# Patient Record
Sex: Male | Born: 1955 | Race: White | Hispanic: No | Marital: Married | Smoking: Former smoker
Health system: Southern US, Community
[De-identification: ages and names within clinical notes are randomized; demographics above are authoritative.]

## PROBLEM LIST (undated history)

## (undated) DIAGNOSIS — N189 Chronic kidney disease, unspecified: Secondary | ICD-10-CM

## (undated) DIAGNOSIS — I509 Heart failure, unspecified: Secondary | ICD-10-CM

## (undated) DIAGNOSIS — R809 Proteinuria, unspecified: Secondary | ICD-10-CM

## (undated) DIAGNOSIS — R972 Elevated prostate specific antigen [PSA]: Secondary | ICD-10-CM

## (undated) DIAGNOSIS — I219 Acute myocardial infarction, unspecified: Secondary | ICD-10-CM

## (undated) DIAGNOSIS — R0902 Hypoxemia: Secondary | ICD-10-CM

## (undated) DIAGNOSIS — Z9989 Dependence on other enabling machines and devices: Secondary | ICD-10-CM

## (undated) DIAGNOSIS — F32A Depression, unspecified: Secondary | ICD-10-CM

## (undated) DIAGNOSIS — F329 Major depressive disorder, single episode, unspecified: Secondary | ICD-10-CM

## (undated) DIAGNOSIS — I499 Cardiac arrhythmia, unspecified: Secondary | ICD-10-CM

## (undated) DIAGNOSIS — I5042 Chronic combined systolic (congestive) and diastolic (congestive) heart failure: Secondary | ICD-10-CM

## (undated) DIAGNOSIS — K635 Polyp of colon: Secondary | ICD-10-CM

## (undated) DIAGNOSIS — E119 Type 2 diabetes mellitus without complications: Secondary | ICD-10-CM

## (undated) DIAGNOSIS — G4733 Obstructive sleep apnea (adult) (pediatric): Secondary | ICD-10-CM

## (undated) DIAGNOSIS — G709 Myoneural disorder, unspecified: Secondary | ICD-10-CM

## (undated) DIAGNOSIS — M1990 Unspecified osteoarthritis, unspecified site: Secondary | ICD-10-CM

## (undated) DIAGNOSIS — G4730 Sleep apnea, unspecified: Secondary | ICD-10-CM

## (undated) DIAGNOSIS — E7800 Pure hypercholesterolemia, unspecified: Secondary | ICD-10-CM

## (undated) DIAGNOSIS — Z8719 Personal history of other diseases of the digestive system: Secondary | ICD-10-CM

## (undated) DIAGNOSIS — E785 Hyperlipidemia, unspecified: Secondary | ICD-10-CM

## (undated) DIAGNOSIS — I739 Peripheral vascular disease, unspecified: Secondary | ICD-10-CM

## (undated) DIAGNOSIS — Z95810 Presence of automatic (implantable) cardiac defibrillator: Secondary | ICD-10-CM

## (undated) DIAGNOSIS — I2510 Atherosclerotic heart disease of native coronary artery without angina pectoris: Secondary | ICD-10-CM

## (undated) DIAGNOSIS — J189 Pneumonia, unspecified organism: Secondary | ICD-10-CM

## (undated) DIAGNOSIS — I10 Essential (primary) hypertension: Secondary | ICD-10-CM

## (undated) HISTORY — DX: Hyperlipidemia, unspecified: E78.5

## (undated) HISTORY — DX: Major depressive disorder, single episode, unspecified: F32.9

## (undated) HISTORY — PX: CORONARY ANGIOPLASTY WITH STENT PLACEMENT: SHX49

## (undated) HISTORY — PX: COLONOSCOPY: SHX174

## (undated) HISTORY — DX: Hypoxemia: R09.02

## (undated) HISTORY — DX: Polyp of colon: K63.5

## (undated) HISTORY — PX: ELBOW SURGERY: SHX618

## (undated) HISTORY — PX: CORONARY ARTERY BYPASS GRAFT: SHX141

## (undated) HISTORY — PX: EYE SURGERY: SHX253

## (undated) HISTORY — DX: Depression, unspecified: F32.A

## (undated) HISTORY — PX: PROSTATE BIOPSY: SHX241

## (undated) SURGERY — Surgical Case
Anesthesia: *Unknown

---
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
## 1998-08-26 ENCOUNTER — Encounter: Payer: Self-pay | Admitting: Emergency Medicine

## 1998-08-26 ENCOUNTER — Emergency Department (HOSPITAL_COMMUNITY): Admission: EM | Admit: 1998-08-26 | Discharge: 1998-08-26 | Payer: Self-pay | Admitting: Emergency Medicine

## 2001-01-25 DIAGNOSIS — I219 Acute myocardial infarction, unspecified: Secondary | ICD-10-CM

## 2001-01-25 HISTORY — PX: CARDIAC CATHETERIZATION: SHX172

## 2001-01-25 HISTORY — DX: Acute myocardial infarction, unspecified: I21.9

## 2014-01-25 DIAGNOSIS — Z95810 Presence of automatic (implantable) cardiac defibrillator: Secondary | ICD-10-CM

## 2014-01-25 HISTORY — PX: CARDIAC DEFIBRILLATOR PLACEMENT: SHX171

## 2014-01-25 HISTORY — DX: Presence of automatic (implantable) cardiac defibrillator: Z95.810

## 2014-03-26 HISTORY — PX: CATARACT EXTRACTION W/ INTRAOCULAR LENS IMPLANT: SHX1309

## 2014-07-15 ENCOUNTER — Inpatient Hospital Stay (HOSPITAL_COMMUNITY)
Admission: EM | Admit: 2014-07-15 | Discharge: 2014-07-17 | DRG: 250 | Disposition: A | Discharge status: Home / Self Care | Payer: Medicare Other | Attending: Cardiology | Admitting: Cardiology

## 2014-07-15 DIAGNOSIS — I214 Non-ST elevation (NSTEMI) myocardial infarction: Secondary | ICD-10-CM | POA: Diagnosis not present

## 2014-07-15 DIAGNOSIS — I10 Essential (primary) hypertension: Secondary | ICD-10-CM | POA: Diagnosis present

## 2014-07-15 DIAGNOSIS — T82858A Stenosis of vascular prosthetic devices, implants and grafts, initial encounter: Principal | ICD-10-CM | POA: Diagnosis present

## 2014-07-15 DIAGNOSIS — I25118 Atherosclerotic heart disease of native coronary artery with other forms of angina pectoris: Secondary | ICD-10-CM | POA: Diagnosis present

## 2014-07-15 DIAGNOSIS — I252 Old myocardial infarction: Secondary | ICD-10-CM | POA: Diagnosis not present

## 2014-07-15 DIAGNOSIS — E1165 Type 2 diabetes mellitus with hyperglycemia: Secondary | ICD-10-CM | POA: Diagnosis present

## 2014-07-15 DIAGNOSIS — Z7902 Long term (current) use of antithrombotics/antiplatelets: Secondary | ICD-10-CM

## 2014-07-15 DIAGNOSIS — Z7982 Long term (current) use of aspirin: Secondary | ICD-10-CM

## 2014-07-15 DIAGNOSIS — E669 Obesity, unspecified: Secondary | ICD-10-CM | POA: Diagnosis not present

## 2014-07-15 DIAGNOSIS — R079 Chest pain, unspecified: Secondary | ICD-10-CM

## 2014-07-15 DIAGNOSIS — Z6837 Body mass index (BMI) 37.0-37.9, adult: Secondary | ICD-10-CM | POA: Diagnosis not present

## 2014-07-15 DIAGNOSIS — Z794 Long term (current) use of insulin: Secondary | ICD-10-CM | POA: Diagnosis not present

## 2014-07-15 DIAGNOSIS — E780 Pure hypercholesterolemia: Secondary | ICD-10-CM | POA: Diagnosis present

## 2014-07-15 HISTORY — DX: Proteinuria, unspecified: R80.9

## 2014-07-15 HISTORY — DX: Pure hypercholesterolemia, unspecified: E78.00

## 2014-07-15 HISTORY — DX: Unspecified osteoarthritis, unspecified site: M19.90

## 2014-07-15 HISTORY — DX: Essential (primary) hypertension: I10

## 2014-07-15 HISTORY — DX: Acute myocardial infarction, unspecified: I21.9

## 2014-07-15 HISTORY — DX: Atherosclerotic heart disease of native coronary artery without angina pectoris: I25.10

## 2014-07-15 HISTORY — DX: Type 2 diabetes mellitus without complications: E11.9

## 2014-07-15 HISTORY — DX: Sleep apnea, unspecified: G47.30

## 2014-07-16 ENCOUNTER — Encounter (HOSPITAL_COMMUNITY): Payer: Self-pay

## 2014-07-16 ENCOUNTER — Emergency Department (HOSPITAL_COMMUNITY): Payer: Medicare Other

## 2014-07-16 ENCOUNTER — Encounter (HOSPITAL_COMMUNITY)
Admission: EM | Disposition: A | Discharge status: Home / Self Care | Payer: Self-pay | Source: Home / Self Care | Attending: Cardiology

## 2014-07-16 DIAGNOSIS — E669 Obesity, unspecified: Secondary | ICD-10-CM | POA: Diagnosis present

## 2014-07-16 DIAGNOSIS — R079 Chest pain, unspecified: Secondary | ICD-10-CM | POA: Diagnosis present

## 2014-07-16 DIAGNOSIS — Z7902 Long term (current) use of antithrombotics/antiplatelets: Secondary | ICD-10-CM | POA: Diagnosis not present

## 2014-07-16 DIAGNOSIS — I252 Old myocardial infarction: Secondary | ICD-10-CM | POA: Diagnosis not present

## 2014-07-16 DIAGNOSIS — Z794 Long term (current) use of insulin: Secondary | ICD-10-CM | POA: Diagnosis not present

## 2014-07-16 DIAGNOSIS — I214 Non-ST elevation (NSTEMI) myocardial infarction: Secondary | ICD-10-CM

## 2014-07-16 DIAGNOSIS — I10 Essential (primary) hypertension: Secondary | ICD-10-CM | POA: Diagnosis present

## 2014-07-16 DIAGNOSIS — I25118 Atherosclerotic heart disease of native coronary artery with other forms of angina pectoris: Secondary | ICD-10-CM | POA: Diagnosis not present

## 2014-07-16 DIAGNOSIS — Z6837 Body mass index (BMI) 37.0-37.9, adult: Secondary | ICD-10-CM | POA: Diagnosis not present

## 2014-07-16 DIAGNOSIS — Z7982 Long term (current) use of aspirin: Secondary | ICD-10-CM | POA: Diagnosis not present

## 2014-07-16 DIAGNOSIS — R0789 Other chest pain: Secondary | ICD-10-CM | POA: Diagnosis not present

## 2014-07-16 DIAGNOSIS — I25708 Atherosclerosis of coronary artery bypass graft(s), unspecified, with other forms of angina pectoris: Secondary | ICD-10-CM | POA: Diagnosis not present

## 2014-07-16 DIAGNOSIS — E780 Pure hypercholesterolemia: Secondary | ICD-10-CM | POA: Diagnosis present

## 2014-07-16 DIAGNOSIS — T82858A Stenosis of vascular prosthetic devices, implants and grafts, initial encounter: Secondary | ICD-10-CM | POA: Diagnosis not present

## 2014-07-16 DIAGNOSIS — E1165 Type 2 diabetes mellitus with hyperglycemia: Secondary | ICD-10-CM | POA: Diagnosis not present

## 2014-07-16 HISTORY — PX: CARDIAC CATHETERIZATION: SHX172

## 2014-07-16 HISTORY — DX: Non-ST elevation (NSTEMI) myocardial infarction: I21.4

## 2014-07-16 LAB — GLUCOSE, CAPILLARY
Glucose-Capillary: 452 mg/dL — ABNORMAL HIGH (ref 65–99)
Glucose-Capillary: 247 mg/dL — ABNORMAL HIGH (ref 65–99)
Glucose-Capillary: 203 mg/dL — ABNORMAL HIGH (ref 65–99)
Glucose-Capillary: 139 mg/dL — ABNORMAL HIGH (ref 65–99)
Glucose-Capillary: 277 mg/dL — ABNORMAL HIGH (ref 65–99)

## 2014-07-16 LAB — CBG MONITORING, ED: Glucose-Capillary: 461 mg/dL — ABNORMAL HIGH (ref 65–99)

## 2014-07-16 LAB — TROPONIN I
Troponin I: 0.7 ng/mL (ref <0.031)
Troponin I: 3.81 ng/mL (ref <0.031)
Troponin I: 6.39 ng/mL (ref <0.031)

## 2014-07-16 LAB — COMPREHENSIVE METABOLIC PANEL
ALT: 17 U/L (ref 17–63)
AST: 19 U/L (ref 15–41)
Albumin: 3.7 g/dL (ref 3.5–5.0)
Alkaline Phosphatase: 92 U/L (ref 38–126)
Anion gap: 9 (ref 5–15)
BUN: 21 mg/dL — ABNORMAL HIGH (ref 6–20)
CO2: 25 mmol/L (ref 22–32)
Calcium: 9.1 mg/dL (ref 8.9–10.3)
Chloride: 97 mmol/L — ABNORMAL LOW (ref 101–111)
Creatinine, Ser: 1.35 mg/dL — ABNORMAL HIGH (ref 0.61–1.24)
GFR calc Af Amer: >60 mL/min (ref >60)
GFR calc non Af Amer: 56 mL/min — ABNORMAL LOW (ref >60)
Glucose, Bld: 572 mg/dL (ref 65–99)
Potassium: 4.6 mmol/L (ref 3.5–5.1)
Sodium: 131 mmol/L — ABNORMAL LOW (ref 135–145)
Total Bilirubin: 0.9 mg/dL (ref 0.3–1.2)
Total Protein: 6.5 g/dL (ref 6.5–8.1)

## 2014-07-16 LAB — I-STAT TROPONIN, ED: Troponin i, poc: 0.06 ng/mL (ref 0.00–0.08)

## 2014-07-16 LAB — CBC WITH DIFFERENTIAL/PLATELET
Basophils Absolute: 0.1 10*3/uL (ref 0.0–0.1)
Basophils Relative: 1 % (ref 0–1)
Eosinophils Absolute: 0.3 10*3/uL (ref 0.0–0.7)
Eosinophils Relative: 3 % (ref 0–5)
HCT: 40.2 % (ref 39.0–52.0)
Hemoglobin: 13.8 g/dL (ref 13.0–17.0)
Lymphocytes Relative: 15 % (ref 12–46)
Lymphs Abs: 1.3 10*3/uL (ref 0.7–4.0)
MCH: 30.3 pg (ref 26.0–34.0)
MCHC: 34.3 g/dL (ref 30.0–36.0)
MCV: 88.4 fL (ref 78.0–100.0)
Monocytes Absolute: 0.7 10*3/uL (ref 0.1–1.0)
Monocytes Relative: 8 % (ref 3–12)
Neutro Abs: 6.3 10*3/uL (ref 1.7–7.7)
Neutrophils Relative %: 73 % (ref 43–77)
Platelets: 214 10*3/uL (ref 150–400)
RBC: 4.55 MIL/uL (ref 4.22–5.81)
RDW: 12.5 % (ref 11.5–15.5)
WBC: 8.7 10*3/uL (ref 4.0–10.5)

## 2014-07-16 LAB — BRAIN NATRIURETIC PEPTIDE: B Natriuretic Peptide: 65.7 pg/mL (ref 0.0–100.0)

## 2014-07-16 LAB — LIPID PANEL
Cholesterol: 145 mg/dL (ref 0–200)
HDL: 26 mg/dL — ABNORMAL LOW (ref >40)
LDL Cholesterol: 74 mg/dL (ref 0–99)
Total CHOL/HDL Ratio: 5.6 RATIO
Triglycerides: 223 mg/dL — ABNORMAL HIGH (ref <150)
VLDL: 45 mg/dL — ABNORMAL HIGH (ref 0–40)

## 2014-07-16 LAB — PROTIME-INR
INR: 1.06 (ref 0.00–1.49)
Prothrombin Time: 14 seconds (ref 11.6–15.2)

## 2014-07-16 LAB — TSH: TSH: 2.068 u[IU]/mL (ref 0.350–4.500)

## 2014-07-16 LAB — POCT ACTIVATED CLOTTING TIME: Activated Clotting Time: 435 seconds

## 2014-07-16 SURGERY — LEFT HEART CATH AND CORONARY ANGIOGRAPHY

## 2014-07-16 MED ORDER — HYDRALAZINE HCL 20 MG/ML IJ SOLN
INTRAMUSCULAR | Status: AC
  Filled 2014-07-16: qty 1

## 2014-07-16 MED ORDER — SODIUM CHLORIDE 0.9 % IJ SOLN: 3.0000 mL | INTRAMUSCULAR | Status: DC | PRN

## 2014-07-16 MED ORDER — OMEGA-3-ACID ETHYL ESTERS 1 G PO CAPS
2.0000 g | ORAL_CAPSULE | Freq: Two times a day (BID) | ORAL | Status: DC
  Administered 2014-07-16 – 2014-07-17 (×2): 2 g via ORAL
  Filled 2014-07-16 (×2): qty 2

## 2014-07-16 MED ORDER — ATORVASTATIN CALCIUM 80 MG PO TABS
80.0000 mg | ORAL_TABLET | Freq: Every day | ORAL | Status: DC
  Administered 2014-07-17: 11:00:00 80 mg via ORAL
  Filled 2014-07-16 (×2): qty 1

## 2014-07-16 MED ORDER — NITROGLYCERIN 0.4 MG SL SUBL
0.4000 mg | SUBLINGUAL_TABLET | SUBLINGUAL | Status: DC | PRN
Start: 2014-07-16 — End: 2014-07-17

## 2014-07-16 MED ORDER — SODIUM CHLORIDE 0.9 % IV SOLN
INTRAVENOUS | Status: AC
  Administered 2014-07-16: 03:00:00 via INTRAVENOUS

## 2014-07-16 MED ORDER — ASPIRIN 81 MG PO CHEW: 81.0000 mg | CHEWABLE_TABLET | ORAL | Status: DC

## 2014-07-16 MED ORDER — NITROGLYCERIN 1 MG/10 ML FOR IR/CATH LAB
INTRA_ARTERIAL | Status: AC
  Filled 2014-07-16: qty 10

## 2014-07-16 MED ORDER — ASPIRIN 81 MG PO CHEW
CHEWABLE_TABLET | ORAL | Status: DC | PRN
  Administered 2014-07-16: 324 mg via ORAL

## 2014-07-16 MED ORDER — BIVALIRUDIN BOLUS VIA INFUSION - CUPID
INTRAVENOUS | Status: DC | PRN
  Administered 2014-07-16: 77.175 mg via INTRAVENOUS

## 2014-07-16 MED ORDER — SODIUM CHLORIDE 0.9 % IV SOLN: INTRAVENOUS | Status: AC

## 2014-07-16 MED ORDER — INSULIN REGULAR HUMAN (CONC) 500 UNIT/ML ~~LOC~~ SOLN: 80.0000 [IU] | Freq: Every day | SUBCUTANEOUS | Status: DC

## 2014-07-16 MED ORDER — HYDRALAZINE HCL 20 MG/ML IJ SOLN: 10.0000 mg | INTRAMUSCULAR | Status: DC | PRN

## 2014-07-16 MED ORDER — SODIUM CHLORIDE 0.9 % IV BOLUS (SEPSIS)
1000.0000 mL | Freq: Once | INTRAVENOUS | Status: AC
  Administered 2014-07-16: 1000 mL via INTRAVENOUS

## 2014-07-16 MED ORDER — INSULIN REGULAR HUMAN (CONC) 500 UNIT/ML ~~LOC~~ SOLN: 16.0000 [IU] | Freq: Every day | SUBCUTANEOUS | Status: DC

## 2014-07-16 MED ORDER — ASPIRIN 81 MG PO CHEW
CHEWABLE_TABLET | ORAL | Status: AC
  Filled 2014-07-16: qty 1

## 2014-07-16 MED ORDER — INSULIN REGULAR HUMAN (CONC) 500 UNIT/ML ~~LOC~~ SOLN: 100.0000 [IU] | Freq: Every day | SUBCUTANEOUS | Status: DC

## 2014-07-16 MED ORDER — SODIUM CHLORIDE 0.9 % IV SOLN: 250.0000 mL | INTRAVENOUS | Status: DC | PRN

## 2014-07-16 MED ORDER — HYDROMORPHONE HCL 1 MG/ML IJ SOLN
INTRAMUSCULAR | Status: AC
  Filled 2014-07-16: qty 1

## 2014-07-16 MED ORDER — BIVALIRUDIN 250 MG IV SOLR
INTRAVENOUS | Status: AC
  Filled 2014-07-16: qty 250

## 2014-07-16 MED ORDER — HEPARIN (PORCINE) IN NACL 2-0.9 UNIT/ML-% IJ SOLN
INTRAMUSCULAR | Status: AC
  Filled 2014-07-16: qty 1000

## 2014-07-16 MED ORDER — SODIUM CHLORIDE 0.9 % IJ SOLN
3.0000 mL | Freq: Two times a day (BID) | INTRAMUSCULAR | Status: DC
  Administered 2014-07-16: 3 mL via INTRAVENOUS

## 2014-07-16 MED ORDER — SODIUM CHLORIDE 0.9 % IJ SOLN
3.0000 mL | Freq: Two times a day (BID) | INTRAMUSCULAR | Status: DC
  Administered 2014-07-17: 11:00:00 3 mL via INTRAVENOUS

## 2014-07-16 MED ORDER — SODIUM CHLORIDE 0.9 % IV SOLN
250.0000 mg | INTRAVENOUS | Status: DC | PRN
  Administered 2014-07-16: 1.75 mg/kg/h via INTRAVENOUS

## 2014-07-16 MED ORDER — MORPHINE SULFATE 4 MG/ML IJ SOLN
4.0000 mg | Freq: Once | INTRAMUSCULAR | Status: AC
  Administered 2014-07-16: 4 mg via INTRAVENOUS
  Filled 2014-07-16: qty 1

## 2014-07-16 MED ORDER — INSULIN REGULAR HUMAN (CONC) 500 UNIT/ML ~~LOC~~ SOLN
50.0000 [IU] | Freq: Every day | SUBCUTANEOUS | Status: DC
  Filled 2014-07-16: qty 20

## 2014-07-16 MED ORDER — LIDOCAINE HCL (PF) 1 % IJ SOLN
INTRAMUSCULAR | Status: DC | PRN
  Administered 2014-07-16: 15 mL

## 2014-07-16 MED ORDER — LIDOCAINE HCL (PF) 1 % IJ SOLN
INTRAMUSCULAR | Status: AC
  Filled 2014-07-16: qty 30

## 2014-07-16 MED ORDER — INSULIN REGULAR HUMAN (CONC) 500 UNIT/ML ~~LOC~~ SOLN: 100.0000 [IU] | Freq: Two times a day (BID) | SUBCUTANEOUS | Status: DC

## 2014-07-16 MED ORDER — HEPARIN BOLUS VIA INFUSION
4000.0000 [IU] | Freq: Once | INTRAVENOUS | Status: AC
  Administered 2014-07-16: 4000 [IU] via INTRAVENOUS
  Filled 2014-07-16: qty 4000

## 2014-07-16 MED ORDER — ASPIRIN EC 81 MG PO TBEC
81.0000 mg | DELAYED_RELEASE_TABLET | Freq: Every day | ORAL | Status: DC
  Administered 2014-07-17: 11:00:00 81 mg via ORAL
  Filled 2014-07-16: qty 1

## 2014-07-16 MED ORDER — LISINOPRIL 40 MG PO TABS
40.0000 mg | ORAL_TABLET | Freq: Every day | ORAL | Status: DC
  Administered 2014-07-17: 40 mg via ORAL
  Filled 2014-07-16 (×2): qty 1

## 2014-07-16 MED ORDER — INSULIN REGULAR HUMAN (CONC) 500 UNIT/ML ~~LOC~~ SOLN
60.0000 [IU] | Freq: Every day | SUBCUTANEOUS | Status: DC
  Administered 2014-07-16: 21:00:00 60 [IU] via SUBCUTANEOUS
  Filled 2014-07-16: qty 20

## 2014-07-16 MED ORDER — INSULIN ASPART 100 UNIT/ML ~~LOC~~ SOLN: 20.0000 [IU] | Freq: Three times a day (TID) | SUBCUTANEOUS | Status: DC

## 2014-07-16 MED ORDER — PRASUGREL HCL 10 MG PO TABS
10.0000 mg | ORAL_TABLET | Freq: Every day | ORAL | Status: DC
  Administered 2014-07-16 – 2014-07-17 (×2): 10 mg via ORAL
  Filled 2014-07-16 (×2): qty 1

## 2014-07-16 MED ORDER — INSULIN REGULAR HUMAN (CONC) 500 UNIT/ML ~~LOC~~ SOLN: 20.0000 [IU] | Freq: Every day | SUBCUTANEOUS | Status: DC

## 2014-07-16 MED ORDER — MIDAZOLAM HCL 2 MG/2ML IJ SOLN
INTRAMUSCULAR | Status: DC | PRN
  Administered 2014-07-16: 1 mg via INTRAVENOUS

## 2014-07-16 MED ORDER — ACETAMINOPHEN 325 MG PO TABS: 650.0000 mg | ORAL_TABLET | ORAL | Status: DC | PRN

## 2014-07-16 MED ORDER — SODIUM CHLORIDE 0.9 % IV SOLN: INTRAVENOUS | Status: DC

## 2014-07-16 MED ORDER — HYDROMORPHONE HCL 1 MG/ML IJ SOLN
INTRAMUSCULAR | Status: DC | PRN
Start: 2014-07-16 — End: 2014-07-16
  Administered 2014-07-16: 0.5 mg via INTRAVENOUS

## 2014-07-16 MED ORDER — ZOLPIDEM TARTRATE 5 MG PO TABS: 5.0000 mg | ORAL_TABLET | Freq: Every evening | ORAL | Status: DC | PRN

## 2014-07-16 MED ORDER — ANGIOPLASTY BOOK
Freq: Once | Status: AC
  Administered 2014-07-16: 21:00:00
  Filled 2014-07-16: qty 1

## 2014-07-16 MED ORDER — MIDAZOLAM HCL 2 MG/2ML IJ SOLN
INTRAMUSCULAR | Status: AC
  Filled 2014-07-16: qty 2

## 2014-07-16 MED ORDER — IOHEXOL 350 MG/ML SOLN
INTRAVENOUS | Status: DC | PRN
  Administered 2014-07-16: 195 mL via INTRA_ARTERIAL

## 2014-07-16 MED ORDER — HEPARIN (PORCINE) IN NACL 100-0.45 UNIT/ML-% IJ SOLN
1250.0000 [IU]/h | INTRAMUSCULAR | Status: DC
  Administered 2014-07-16: 1250 [IU]/h via INTRAVENOUS
  Filled 2014-07-16 (×3): qty 250

## 2014-07-16 MED ORDER — DIAZEPAM 5 MG PO TABS: 5.0000 mg | ORAL_TABLET | Freq: Four times a day (QID) | ORAL | Status: DC | PRN

## 2014-07-16 MED ORDER — BIVALIRUDIN 250 MG IV SOLR
1.7500 mg/kg/h | INTRAVENOUS | Status: DC
  Administered 2014-07-16: 1.75 mg/kg/h via INTRAVENOUS
  Filled 2014-07-16: qty 250

## 2014-07-16 MED ORDER — INSULIN GLARGINE 100 UNIT/ML ~~LOC~~ SOLN
50.0000 [IU] | Freq: Once | SUBCUTANEOUS | Status: AC
  Administered 2014-07-16: 50 [IU] via SUBCUTANEOUS
  Filled 2014-07-16: qty 0.5

## 2014-07-16 MED ORDER — INSULIN ASPART 100 UNIT/ML ~~LOC~~ SOLN
20.0000 [IU] | Freq: Once | SUBCUTANEOUS | Status: AC
  Administered 2014-07-16: 20 [IU] via SUBCUTANEOUS
  Filled 2014-07-16: qty 1

## 2014-07-16 MED ORDER — METOPROLOL TARTRATE 25 MG PO TABS
50.0000 mg | ORAL_TABLET | Freq: Two times a day (BID) | ORAL | Status: DC
  Administered 2014-07-16 – 2014-07-17 (×3): 50 mg via ORAL
  Filled 2014-07-16: qty 2
  Filled 2014-07-16: qty 1
  Filled 2014-07-16 (×2): qty 2
  Filled 2014-07-16: qty 1

## 2014-07-16 MED ORDER — ONDANSETRON HCL 4 MG/2ML IJ SOLN: 4.0000 mg | Freq: Four times a day (QID) | INTRAMUSCULAR | Status: DC | PRN

## 2014-07-16 SURGICAL SUPPLY — 26 items
BALLN ANGIOSCULPT RX 3.0X6 (BALLOONS) ×3
BALLN EMERGE MR 2.5X15 (BALLOONS) ×6
BALLN EMERGE MR PUSH 1.20X12 (BALLOONS) ×3
BALLOON ANGIOSCULPT RX 3.0X6 (BALLOONS) ×2 IMPLANT
BALLOON EMERGE MR 2.5X15 (BALLOONS) ×4 IMPLANT
BALLOON EMERGE MR PUSH 1.20X12 (BALLOONS) ×2 IMPLANT
CATH INFINITI 5 FR IM (CATHETERS) ×3 IMPLANT
CATH INFINITI 5FR JL4 (CATHETERS) ×3 IMPLANT
CATH INFINITI 5FR MPB2 (CATHETERS) ×3 IMPLANT
CATH LAUNCHER 6FR AL2 (CATHETERS) ×2 IMPLANT
CATHETER LAUNCHER 6FR AL2 (CATHETERS) ×3
GUIDELINER 6F (CATHETERS) ×3 IMPLANT
HOVERMATT SINGLE USE (MISCELLANEOUS) ×3 IMPLANT
KIT ENCORE 26 ADVANTAGE (KITS) ×6 IMPLANT
KIT HEART LEFT (KITS) ×3 IMPLANT
PACK CARDIAC CATHETERIZATION (CUSTOM PROCEDURE TRAY) ×3 IMPLANT
SHEATH PINNACLE 5F 10CM (SHEATH) ×3 IMPLANT
SHEATH PINNACLE 6F 10CM (SHEATH) ×3 IMPLANT
TRANSDUCER W/STOPCOCK (MISCELLANEOUS) ×3 IMPLANT
TUBING CIL FLEX 10 FLL-RA (TUBING) ×3 IMPLANT
WIRE COUGAR XT STRL 190CM (WIRE) ×3 IMPLANT
WIRE EMERALD 3MM-J .035X150CM (WIRE) ×3 IMPLANT
WIRE HI TORQ BMW 190CM (WIRE) ×3 IMPLANT
WIRE HI TORQ VERSACORE-J 145CM (WIRE) ×3 IMPLANT
WIRE HI TORQ WIGGLE 190CM (WIRE) ×3 IMPLANT
WIRE SAFE-T 1.5MM-J .035X260CM (WIRE) ×3 IMPLANT

## 2014-07-16 NOTE — Progress Notes (Signed)
Admitted pt from ED per stretcher. Alert and Oriented x4, ambulatory, denies chest pain, denies nausea and vomiting, not in respiratory distress. Placed on telemetry box 27 with CMT notified. Oriented to room and call bell. CBG taken at 452 mg/dL, pt stated that he is taking U500 insulin at home BID but was not able to take it yesterday. Dr. Einar Gip was notified and order for 50 U Lantus subcutaneous now. Will continue to monitor pt   07/16/14 0228  Vitals  Temp 98.2 F (36.8 C)  Temp Source Oral  BP (!) 155/93 mmHg  BP Location Left Arm  BP Method Automatic  Patient Position (if appropriate) Sitting  Pulse Rate 91  Pulse Rate Source Dinamap  Resp 18  Oxygen Therapy  SpO2 95 %  O2 Device Room Air  Pain Assessment  Pain Assessment No/denies pain  Height and Weight  Height 5\' 7"  (1.702 m)  Weight 102.921 kg (226 lb 14.4 oz) (b scale)  Type of Scale Used Standing  Type of Weight Actual  BSA (Calculated - sq m) 2.21 sq meters  BMI (Calculated) 35.6  Weight in (lb) to have BMI = 25 159.3

## 2014-07-16 NOTE — ED Provider Notes (Signed)
CSN: [id number]     Arrival date & time 07/15/14  2350 History   First MD Initiated Contact with Patient 07/16/14 0002     Chief Complaint  Patient presents with  . Chest Pain    (Consider location/radiation/quality/duration/timing/severity/associated sxs/prior Treatment) HPI Comments: 59 year old male presents to the emergency department for further evaluation of chest pain. Patient states that chest pain began acutely at 2230 after eating watermelon and cantaloupe. Patient describes the pain as central and stabbing. Pain was nonradiating, but associated with diaphoresis and shortness of breath. Patient reports improvement in symptoms with nitroglycerin 1 given by EMS. Patient also took 325 mg aspirin prior to arrival. He rates his chest pain at 3-4/10 at present. He states that he is followed by cardiology at the [facility] in [place], [place]. He reports that his pain feels similar to the chest pain he had one year ago. He states that, at this time, his widow maker was found to be 95% occluded. He had PCI 2 during a cath during this admission, in June 2015. 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 He has had a total of 8 stents placed. He states that his chest pain and stents are a result of his arteries "collapsing" rather than atherosclerotic heart disease. Patient denies any fever, syncope, nausea, vomiting, or leg swelling. He has no PCP in the area.  The history is provided by the patient. No language interpreter was used.    Past Medical History  Diagnosis Date  . Coronary artery disease   . Diabetes mellitus without complication   . MI (myocardial infarction)   . High cholesterol   . Hypertension   . Proteinuria    Past Surgical History  Procedure Laterality Date  . Cardiac surgery    . Joint replacement     No family history on file. History  Substance Use Topics  . Smoking status: Never Smoker   . Smokeless tobacco: Former Systems developer  . Alcohol Use: No     Review of Systems  Constitutional: Positive for diaphoresis. Negative for fever.  Respiratory: Positive for shortness of breath.   Cardiovascular: Positive for chest pain. Negative for leg swelling.  Neurological: Negative for syncope.  All other systems reviewed and are negative.   Allergies  Review of patient's allergies indicates no known allergies.  Home Medications   Prior to Admission medications   Medication Sig Start Date End Date Taking? Authorizing Provider  aspirin EC 81 MG tablet Take 81 mg by mouth daily.   Yes Historical Provider, MD  atorvastatin (LIPITOR) 80 MG tablet Take 80 mg by mouth daily.   Yes Historical Provider, MD  Cholecalciferol (VITAMIN D PO) Take 1 tablet by mouth daily.   Yes Historical Provider, MD  insulin regular human CONCENTRATED (HUMULIN R) 500 UNIT/ML injection Inject 100 Units into the skin 2 (two) times daily with a meal.   Yes Historical Provider, MD  lisinopril (PRINIVIL,ZESTRIL) 40 MG tablet Take 40 mg by mouth daily.   Yes Historical Provider, MD  metFORMIN (GLUCOPHAGE) 1000 MG tablet Take 1,000 mg by mouth 2 (two) times daily with a meal.   Yes Historical Provider, MD  metoprolol (LOPRESSOR) 50 MG tablet Take 50 mg by mouth 2 (two) times daily.   Yes Historical Provider, MD  Omega-3 Fatty Acids (FISH OIL PO) Take 1 tablet by mouth daily.   Yes Historical Provider, MD  prasugrel (EFFIENT) 10 MG TABS tablet Take 10 mg by mouth daily.   Yes  Historical Provider, MD   BP 146/82 mmHg  Pulse 95  Temp(Src) 98.3 F (36.8 C) (Oral)  Resp 16  Ht 5\' 7"  (1.702 m)  Wt 235 lb (106.595 kg)  BMI 36.80 kg/m2  SpO2 96%   Physical Exam  Constitutional: He is oriented to person, place, and time. He appears well-developed and well-nourished. No distress.  Nontoxic/nonseptic appearing  HENT:  Head: Normocephalic and atraumatic.  Eyes: Conjunctivae and EOM are normal. No scleral icterus.  Neck: Normal range of motion.  Cardiovascular: Normal rate,  regular rhythm and intact distal pulses.   Distal radial pulse 2+ b/l  Pulmonary/Chest: Effort normal and breath sounds normal. No respiratory distress. He has no wheezes. He has no rales.  Respirations even and unlabored  Abdominal: Soft. He exhibits no distension. There is no tenderness. There is no rebound and no guarding.  Soft, nontender and obese abdomen  Musculoskeletal: Normal range of motion.  Neurological: He is alert and oriented to person, place, and time. He exhibits normal muscle tone. Coordination normal.  GCS 15. Speech is goal oriented. Patient moves extremities without ataxia.  Skin: Skin is warm and dry. No rash noted. He is not diaphoretic. No erythema. No pallor.  Psychiatric: He has a normal mood and affect. His behavior is normal.  Nursing note and vitals reviewed.   ED Course  Procedures (including critical care time) Labs Review Labs Reviewed  CBC WITH DIFFERENTIAL/PLATELET  COMPREHENSIVE METABOLIC PANEL  BRAIN NATRIURETIC PEPTIDE  I-STAT [place], ED    Imaging Review Dg Chest 2 View  07/16/2014   CLINICAL DATA:  Patient with shortness of breath and chest pain.  EXAM: CHEST  2 VIEW  COMPARISON:  None.  FINDINGS: Patient status post median sternotomy and CABG procedure. Enlarged cardiac and mediastinal contours. No consolidative pulmonary opacities. No pleural effusion or pneumothorax. Mid thoracic spine degenerative changes.  IMPRESSION: Cardiomegaly.  No acute cardiopulmonary process.   Electronically Signed   By: Lovey Newcomer M.D.   On: 07/16/2014 00:58     EKG Interpretation   Date/Time:  Tuesday July 16 2014 00:03:29 EDT Ventricular Rate:  104 PR Interval:  183 QRS Duration: 120 QT Interval:  368 QTC Calculation: 484 R Axis:   -65 Text Interpretation:  Sinus tachycardia Atrial premature complex  Incomplete left bundle branch block Consider anterior infarct Confirmed by  YELVERTON  MD, DAVID ([id number]) on 07/16/2014 1:48:55 AM      MDM   Final  diagnoses:  Chest pain    Admit for chest pain rule out. Ganji to cath in AM. No evidence of STEMI on EKG. Initial troponin 0.06.   Filed Vitals:   07/16/14 0052 07/16/14 0100 07/16/14 0115 07/16/14 0130  BP:  151/87 147/82 146/82  Pulse:  99 96 95  Temp:      TempSrc:      Resp:  17 17 16   Height: 5\' 7"  (1.702 m)     Weight: 235 lb (106.595 kg)     SpO2:  96% 97% 96%      Antonietta Breach, PA-C 07/16/14 0149  Julianne Rice, MD 07/18/14 [phone]

## 2014-07-16 NOTE — Interval H&P Note (Signed)
History and Physical Interval Note:  07/16/2014 2:32 PM  Martin Bush  has presented today for surgery, with the diagnosis of CP  The various methods of treatment have been discussed with the patient and family. After consideration of risks, benefits and other options for treatment, the patient has consented to  Procedure(s): Left Heart Cath and Coronary Angiography (N/A) and possible PCI Cath Lab Visit (complete for each Cath Lab visit)  Clinical Evaluation Leading to the Procedure:   ACS: Yes.    Non-ACS:    Anginal Classification: CCS IV  Anti-ischemic medical therapy: Minimal Therapy (1 class of medications)  Non-Invasive Test Results: No non-invasive testing performed  Prior CABG: Previous CABG   Procedure being observed by Ms. Elita Boone PA student and Dr.  Phill Myron ([place] resident) and patient consents to this.       as a surgical intervention .  The patient's history has been reviewed, patient examined, no change in status, stable for surgery.  I have reviewed the patient's chart and labs.  Questions were answered to the patient's satisfaction.     Adrian Prows

## 2014-07-16 NOTE — Progress Notes (Signed)
Site area: right groin  Site Prior to Removal:  Level 0  Pressure Applied For 20 MINUTES    Minutes Beginning at 1805  Manual:   Yes.    Patient Status During Pull:  AAO x 4  Post Pull Groin Site:  Level 0  Post Pull Instructions Given:  Yes.    Post Pull Pulses Present:  Yes.    Dressing Applied:  Yes.    Comments:  Pt had short episode of slow HR 33-37 , b/p 96/67 mmhg ,c/o  with slight light headedness  during sheath pull,V/S back  to baseline with no addtl  intervention. V/s monitored,procedure completed.

## 2014-07-16 NOTE — Progress Notes (Signed)
ANTICOAGULATION CONSULT NOTE - Initial Consult  Pharmacy Consult for Lovenox Indication: VTE prophylaxis  No Known Allergies  Patient Measurements: Height: 5\' 7"  (170.2 cm) Weight: 235 lb (106.595 kg) IBW/kg (Calculated) : 66.1  Vital Signs: Temp: 98.3 F (36.8 C) (06/21 0027) Temp Source: Oral (06/21 0027) BP: 127/77 mmHg (06/21 0200) Pulse Rate: 90 (06/21 0200)  Labs:  Recent Labs  07/16/14 0022  HGB 13.8  HCT 40.2  PLT 214  CREATININE 1.35*    Estimated Creatinine Clearance: 69.4 mL/min (by C-G formula based on Cr of 1.35).   Medical History: Past Medical History  Diagnosis Date  . Coronary artery disease   . Diabetes mellitus without complication   . MI (myocardial infarction)   . High cholesterol   . Hypertension   . Proteinuria     Medications:  Prescriptions prior to admission  Medication Sig Dispense Refill Last Dose  . aspirin EC 81 MG tablet Take 81 mg by mouth daily.   07/15/2014 at Unknown time  . atorvastatin (LIPITOR) 80 MG tablet Take 80 mg by mouth daily.   07/14/2014 at Unknown time  . Cholecalciferol (VITAMIN D PO) Take 1 tablet by mouth daily.   07/15/2014 at Unknown time  . insulin regular human CONCENTRATED (HUMULIN R) 500 UNIT/ML injection Inject 100 Units into the skin 2 (two) times daily with a meal.   07/15/2014 at Unknown time  . lisinopril (PRINIVIL,ZESTRIL) 40 MG tablet Take 40 mg by mouth daily.   07/14/2014 at Unknown time  . metFORMIN (GLUCOPHAGE) 1000 MG tablet Take 1,000 mg by mouth 2 (two) times daily with a meal.   07/15/2014 at Unknown time  . metoprolol (LOPRESSOR) 50 MG tablet Take 50 mg by mouth 2 (two) times daily.   07/15/2014 at 0830  . Omega-3 Fatty Acids (FISH OIL PO) Take 1 tablet by mouth daily.   07/15/2014 at Unknown time  . prasugrel (EFFIENT) 10 MG TABS tablet Take 10 mg by mouth daily.   07/15/2014 at Unknown time    Assessment: 59 y.o. male presents with CP. Pharmacy asked to dose Lovenox for VTE prophylaxis. BMI  37. SCr 1.35, est CrCl 65 ml/min. CBC stable.  Goal of Therapy:  Prevention of VTE Monitor platelets by anticoagulation protocol: Yes   Plan:  Lovenox 50mg  SQ q24h (~0.5mg /kg) for pt with BMI >30 Pharmacy will sign off. Please reconsult if needed  Sherlon Handing, PharmD, BCPS Clinical pharmacist, pager [phone] 07/16/2014,2:21 AM

## 2014-07-16 NOTE — Progress Notes (Signed)
CRITICAL VALUE ALERT  Critical value received:  Troponin 0.70  Date of notification:  07/16/2014  Time of notification:  5072  Critical value read back:Yes.    Nurse who received alert:  Kendrick Ranch  MD notified (1st page):  Ganji,J  Time of first page:  [phone]  MD notified (2nd page):  Time of second page:  Responding MD:  Blair Promise from Cath Lab  Time MD responded: awaiting

## 2014-07-16 NOTE — Progress Notes (Addendum)
Spoke with patient and wife about his diabetes. Admitted with chest pain, fatigue.  Has had DM for about 13 years and has been followed at the [facility].  Takes Metformin and U-500 insulin twice a day.  Has been on U-500 for about 1 1/2 years. He takes the amount of  20 unit mark on syringe (equal to 100 units)of U-500 insulin at breakfast and 16-18 unit mark on syringe (equal to 80-90 units) at supper.  Has been having some low blood sugars in the am, so had cut back on the dosage at night. States that Lantus does not work anymore.  The insulin is going to be brought in to the hospital so that pharmacy can dispense. Was given Lantus 50 units this am.  He did not take any insulin yesterday. He would like to find a doctor in [place] that can follow his diabetes.  Will continue to follow while in hospital. Harvel Ricks RN BSN CDE

## 2014-07-16 NOTE — ED Notes (Signed)
Patient here for chest pain, onset after eating tonight, pt reports radiaiting to right arm and feels like MI pt had 1 year ago. Given ntg and brought bp down from 214/142 to 148/93 and pain from 10/10 to 4/10. cbg is 529 and hx of DM.

## 2014-07-16 NOTE — CV Procedure (Signed)
Right femoral arterial access, LVEF 45%, mid to distal anterior, anteroapical, inferoapical akinesis. LVEDP 16 mmHg. High-grade stenosis of the large OM branch of the circumflex coronary artery, multiple stents visualized within the dominant proximal and midsegment of the circumflex coronary artery and the OM1 branch of the circumflex coronary artery. Extremely difficult and very complex and prolonged procedure due to inability to pass even 1.2 mm balloon into the OM1 branch which had restenosis of 99%. Proximal stents obstructed the passage of the balloons.  Successful kissing balloon angioplasty of the mid circumflex and OM1 branch of the dominant circumflex coronary artery. Stenosis in the OM1 branch reduced to 0%, mild Neointimal hyperplasia still evident in the distal segment of the stent however this was left alone. Successful balloon angioplasty of the side branch/large mid to distal circumflex which gave origin to PDA branches. Overall 0% residual stenosis from 90% stenosis after side branch jailing following OM1 balloon angioplasty.  Impression: Successful balloon angioplasty to the in-stent restenotic OM1 and dominant mid circumflex coronary artery with overall reduction of stenosis from 99% to 0% with maintenance of TIMI flow from TIMI-3 to TIMI-3.

## 2014-07-16 NOTE — H&P (Signed)
Martin Bush is an 59 y.o. male.   Chief Complaint: Chest pain HPI: Martin Bush  is a 59 y.o. male  With a past medical history of HTN, HLD, uncontrolled DM, and significant CAD presented to the ED last night with sudden onset midsternal chest pain at 10:30pm while sitting on the couch watching TV. He reports associated dyspnea and diaphoresis, denies N/V.  Pt was markedly hypertensive when EMS arrived which improved with SL Ntg administration x 1. Pain improved with Ntg and was relieved completely with morphine; patient is currently pain free. Pt reports a 1 week history of progressively worsening fatigue.  Pt recently moved to [place] and had previously been seeing [facility] in [place], [place].  He is accompanied by his wife at the bedside.  Cardiac history is as follows: 2000: 2 stents (unknown location) 2002: CABG x 2 - SVG to OM (per future stent card), unsure of 2nd bypass 03/25/09: 3.5 x 15 and 3.5 x 28 Promus DES to SVG-OM 07/16/10: 2.75 x 12 Xience DES to OM 01/15/11: 3.5 x 15 Resolute Integrity DES to prox Cx 07/18/13: 3 x 18 Resolute DES to dist Cx and 4 x 8 Xience Alpine DES to left main  Pt has been on DAPT with ASA and Effient since last stent placement 1 year ago. Per pt report, this was the only time he presented with ACS. Pt reports compliance with medications. He is a nonsmoker.  Remote history of light cigar use.    Past Medical History  Diagnosis Date  . Coronary artery disease   . Diabetes mellitus without complication   . MI (myocardial infarction)   . High cholesterol   . Hypertension   . Proteinuria     Past Surgical History  Procedure Laterality Date  . Cardiac surgery    . Joint replacement      No family history on file. Social History:  reports that he has never smoked. He has quit using smokeless tobacco. He reports that he does not drink alcohol or use illicit drugs.  Allergies: No Known Allergies  Review of Systems - History obtained from the  patient General ROS: positive for  - fatigue and malaise negative for - chills, fever, night sweats, weight gain or weight loss Respiratory ROS: positive for - shortness of breath negative for - cough, hemoptysis or orthopnea Cardiovascular ROS: positive for - chest pain negative for - edema, irregular heartbeat, orthopnea, palpitations or paroxysmal nocturnal dyspnea Gastrointestinal ROS: no abdominal pain, change in bowel habits, or black or bloody stools Neurological ROS: no TIA or stroke symptoms  Blood pressure 101/58, pulse 66, temperature 97.9 F (36.6 C), temperature source Oral, resp. rate 16, height 5' 7" (1.702 m), weight 102.921 kg (226 lb 14.4 oz), SpO2 96 %. General appearance: alert, cooperative, appears older than stated age, no distress and moderately obese Eyes: negative Neck: no adenopathy, no carotid bruit, no JVD, supple, symmetrical, trachea midline and thyroid not enlarged, symmetric, no tenderness/mass/nodules Resp: clear to auscultation bilaterally Chest wall: no tenderness Cardio: regular rate and rhythm, S1, S2 normal, no murmur, click, rub or gallop and distant heart sounds GI: soft, non-tender; bowel sounds normal; no masses,  no organomegaly Extremities: extremities normal, atraumatic, no cyanosis or edema Pulses: 2+ and symmetric Skin: Skin color, texture, turgor normal. No rashes or lesions Neurologic: Grossly normal  Results for orders placed or performed during the hospital encounter of 07/15/14 (from the past 48 hour(s))  CBC with Differential     Status:   None   Collection Time: 07/16/14 12:22 AM  Result Value Ref Range   WBC 8.7 4.0 - 10.5 K/uL   RBC 4.55 4.22 - 5.81 MIL/uL   Hemoglobin 13.8 13.0 - 17.0 g/dL   HCT 40.2 39.0 - 52.0 %   MCV 88.4 78.0 - 100.0 fL   MCH 30.3 26.0 - 34.0 pg   MCHC 34.3 30.0 - 36.0 g/dL   RDW 12.5 11.5 - 15.5 %   Platelets 214 150 - 400 K/uL   Neutrophils Relative % 73 43 - 77 %   Neutro Abs 6.3 1.7 - 7.7 K/uL    Lymphocytes Relative 15 12 - 46 %   Lymphs Abs 1.3 0.7 - 4.0 K/uL   Monocytes Relative 8 3 - 12 %   Monocytes Absolute 0.7 0.1 - 1.0 K/uL   Eosinophils Relative 3 0 - 5 %   Eosinophils Absolute 0.3 0.0 - 0.7 K/uL   Basophils Relative 1 0 - 1 %   Basophils Absolute 0.1 0.0 - 0.1 K/uL  Comprehensive metabolic panel     Status: Abnormal   Collection Time: 07/16/14 12:22 AM  Result Value Ref Range   Sodium 131 (L) 135 - 145 mmol/L   Potassium 4.6 3.5 - 5.1 mmol/L   Chloride 97 (L) 101 - 111 mmol/L   CO2 25 22 - 32 mmol/L   Glucose, Bld 572 (HH) 65 - 99 mg/dL    Comment: CRITICAL RESULT CALLED TO, READ BACK BY AND VERIFIED WITH: BROWN,M RN 07/16/2014 0202 JORDANS REPEATED TO VERIFY    BUN 21 (H) 6 - 20 mg/dL   Creatinine, Ser 1.35 (H) 0.61 - 1.24 mg/dL   Calcium 9.1 8.9 - 10.3 mg/dL   Total Protein 6.5 6.5 - 8.1 g/dL   Albumin 3.7 3.5 - 5.0 g/dL   AST 19 15 - 41 U/L   ALT 17 17 - 63 U/L   Alkaline Phosphatase 92 38 - 126 U/L   Total Bilirubin 0.9 0.3 - 1.2 mg/dL   GFR calc non Af Amer 56 (L) >60 mL/min   GFR calc Af Amer >60 >60 mL/min    Comment: (NOTE) The eGFR has been calculated using the CKD EPI equation. This calculation has not been validated in all clinical situations. eGFR's persistently <60 mL/min signify possible Chronic Kidney Disease.    Anion gap 9 5 - 15  Brain natriuretic peptide     Status: None   Collection Time: 07/16/14 12:22 AM  Result Value Ref Range   B Natriuretic Peptide 65.7 0.0 - 100.0 pg/mL  I-stat troponin, ED     Status: None   Collection Time: 07/16/14 12:30 AM  Result Value Ref Range   Troponin i, poc 0.06 0.00 - 0.08 ng/mL   Comment 3            Comment: Due to the release kinetics of cTnI, a negative result within the first hours of the onset of symptoms does not rule out myocardial infarction with certainty. If myocardial infarction is still suspected, repeat the test at appropriate intervals.   CBG monitoring, ED     Status:  Abnormal   Collection Time: 07/16/14  1:54 AM  Result Value Ref Range   Glucose-Capillary 461 (H) 65 - 99 mg/dL  Glucose, capillary     Status: Abnormal   Collection Time: 07/16/14  2:38 AM  Result Value Ref Range   Glucose-Capillary 452 (H) 65 - 99 mg/dL  Troponin I     Status: Abnormal  Collection Time: 07/16/14  3:10 AM  Result Value Ref Range   Troponin I 0.70 (HH) <0.031 ng/mL    Comment:        POSSIBLE MYOCARDIAL ISCHEMIA. SERIAL TESTING RECOMMENDED. CRITICAL RESULT CALLED TO, READ BACK BY AND VERIFIED WITH: BUENDIA,S RN 07/16/2014 0454 JORDANS REPEATED TO VERIFY   Lipid panel     Status: Abnormal   Collection Time: 07/16/14  3:10 AM  Result Value Ref Range   Cholesterol 145 0 - 200 mg/dL   Triglycerides 223 (H) <150 mg/dL   HDL 26 (L) >40 mg/dL   Total CHOL/HDL Ratio 5.6 RATIO   VLDL 45 (H) 0 - 40 mg/dL   LDL Cholesterol 74 0 - 99 mg/dL    Comment:        Total Cholesterol/HDL:CHD Risk Coronary Heart Disease Risk Table                     Men   Women  1/2 Average Risk   3.4   3.3  Average Risk       5.0   4.4  2 X Average Risk   9.6   7.1  3 X Average Risk  23.4   11.0        Use the calculated Patient Ratio above and the CHD Risk Table to determine the patient's CHD Risk.        ATP III CLASSIFICATION (LDL):  <100     mg/dL   Optimal  100-129  mg/dL   Near or Above                    Optimal  130-159  mg/dL   Borderline  160-189  mg/dL   High  >190     mg/dL   Very High   TSH     Status: None   Collection Time: 07/16/14  4:13 AM  Result Value Ref Range   TSH 2.068 0.350 - 4.500 uIU/mL  Glucose, capillary     Status: Abnormal   Collection Time: 07/16/14  5:56 AM  Result Value Ref Range   Glucose-Capillary 247 (H) 65 - 99 mg/dL   Dg Chest 2 View  07/16/2014   CLINICAL DATA:  Patient with shortness of breath and chest pain.  EXAM: CHEST  2 VIEW  COMPARISON:  None.  FINDINGS: Patient status post median sternotomy and CABG procedure. Enlarged cardiac  and mediastinal contours. No consolidative pulmonary opacities. No pleural effusion or pneumothorax. Mid thoracic spine degenerative changes.  IMPRESSION: Cardiomegaly.  No acute cardiopulmonary process.   Electronically Signed   By: Drew  Davis M.D.   On: 07/16/2014 00:58    Labs:   Lab Results  Component Value Date   WBC 8.7 07/16/2014   HGB 13.8 07/16/2014   HCT 40.2 07/16/2014   MCV 88.4 07/16/2014   PLT 214 07/16/2014    Recent Labs Lab 07/16/14 0022  NA 131*  K 4.6  CL 97*  CO2 25  BUN 21*  CREATININE 1.35*  CALCIUM 9.1  PROT 6.5  BILITOT 0.9  ALKPHOS 92  ALT 17  AST 19  GLUCOSE 572*    Lipid Panel     Component Value Date/Time   CHOL 145 07/16/2014 0310   TRIG 223* 07/16/2014 0310   HDL 26* 07/16/2014 0310   CHOLHDL 5.6 07/16/2014 0310   VLDL 45* 07/16/2014 0310   LDLCALC 74 07/16/2014 0310    BNP (last 3 results)  Recent Labs  07/16/14   0022  BNP 65.7    ProBNP (last 3 results) No results for input(s): PROBNP in the last 8760 hours.  HEMOGLOBIN A1C No results found for: HGBA1C, MPG  Cardiac Panel (last 3 results)  Recent Labs  07/16/14 0310  TROPONINI 0.70*    Lab Results  Component Value Date   TROPONINI 0.70* 07/16/2014     TSH  Recent Labs  07/16/14 0413  TSH 2.068    EKG 07/16/2014: sinus tachycardia at 106bpm, PAC, ILBBB, PRWP, no prior for comparison.  Medications Prior to Admission  Medication Sig Dispense Refill  . aspirin EC 81 MG tablet Take 81 mg by mouth daily.    . atorvastatin (LIPITOR) 80 MG tablet Take 80 mg by mouth daily.    . Cholecalciferol (VITAMIN D PO) Take 1 tablet by mouth daily.    . insulin regular human CONCENTRATED (HUMULIN R) 500 UNIT/ML injection Inject 100 Units into the skin 2 (two) times daily with a meal.    . lisinopril (PRINIVIL,ZESTRIL) 40 MG tablet Take 40 mg by mouth daily.    . metFORMIN (GLUCOPHAGE) 1000 MG tablet Take 1,000 mg by mouth 2 (two) times daily with a meal.    .  metoprolol (LOPRESSOR) 50 MG tablet Take 50 mg by mouth 2 (two) times daily.    . Omega-3 Fatty Acids (FISH OIL PO) Take 1 tablet by mouth daily.    . prasugrel (EFFIENT) 10 MG TABS tablet Take 10 mg by mouth daily.        Current facility-administered medications:  .  0.9 %  sodium chloride infusion, , Intravenous, STAT, Kelly Humes, PA-C, Last Rate: 150 mL/hr at 07/16/14 0230 .  atorvastatin (LIPITOR) tablet 80 mg, 80 mg, Oral, Daily, Kelly Humes, PA-C .  lisinopril (PRINIVIL,ZESTRIL) tablet 40 mg, 40 mg, Oral, Daily, Kelly Humes, PA-C .  metoprolol tartrate (LOPRESSOR) tablet 50 mg, 50 mg, Oral, BID, Kelly Humes, PA-C, 50 mg at 07/16/14 0136 .  nitroGLYCERIN (NITROSTAT) SL tablet 0.4 mg, 0.4 mg, Sublingual, Q5 Min x 3 PRN, Kelly Humes, PA-C .  prasugrel (EFFIENT) tablet 10 mg, 10 mg, Oral, Daily, Kelly Humes, PA-C    Assessment/Plan 1. NSTEMI: chest pain with positive troponin 2. HLD: controlled on 80mg atorvastatin 3. HTN: controlled on present medications 4. DM: uncontrolled, HbA1c pending  Recommendation: Pt with significant history of CAD with multiple coronary angiograms requiring intervention over the past 16 years. Pt's symptoms similar to when he previously presented with ACS per his report and elevated troponin suggests NSTEMI.  Pt will require coronary angiogram with possible PCI.  Pt is very familiar with procedure and risks and alternatives were discussed with the patient and his wife and they are willing to proceed.   Allison,Bridgette Nicole, NP-C 07/16/2014, 7:56 AM [facility] Pager: [phone] Office: [phone]     

## 2014-07-16 NOTE — Progress Notes (Addendum)
ANTICOAGULATION CONSULT NOTE - Initial Consult  Pharmacy Consult:  Heparin Indication: chest pain/ACS  No Known Allergies  Patient Measurements: Height: 5\' 7"  (170.2 cm) Weight: 226 lb 14.4 oz (102.921 kg) (b scale) IBW/kg (Calculated) : 66.1 Heparin Dosing Weight: 89 kg  Vital Signs: Temp: 97.9 F (36.6 C) (06/21 0519) Temp Source: Oral (06/21 0519) BP: 149/84 mmHg (06/21 1134) Pulse Rate: 68 (06/21 1134)  Labs:  Recent Labs  07/16/14 0022 07/16/14 0310 07/16/14 0820  HGB 13.8  --   --   HCT 40.2  --   --   PLT 214  --   --   CREATININE 1.35*  --   --   TROPONINI  --  0.70* 3.81*    Estimated Creatinine Clearance: 68.2 mL/min (by C-G formula based on Cr of 1.35).   Medical History: Past Medical History  Diagnosis Date  . Coronary artery disease   . MI (myocardial infarction)   . High cholesterol   . Hypertension   . Proteinuria   . Sleep apnea     uses cpap  . Diabetes mellitus without complication     insulin dependent  . Arthritis     hands       Assessment: 5 YOM with significant cardiac history and elevated troponins to start IV heparin for ACS.  Baseline labs reviewed.  Patient has not received Lovenox.  Plan for cath today.   Goal of Therapy:  Heparin level 0.3-0.7 units/ml Monitor platelets by anticoagulation protocol: Yes    Plan:  - Heparin 4000 units IV bolus x 1, then - Heparin gtt at 1250 units/hr - Check 6 hr HL if not in cath - Daily HL / CBC - F/U resuming home med post cath for better glycemic control   Costella Schwarz D. Mina Marble, PharmD, BCPS Pager:  [phone] 07/16/2014, 12:22 PM    ====================================    Addendum: - pharmacy to manage insulin U-500 - patient pretends to draw up insulin and showed me how much he draws up: using a 1/2cc syringe, he draws up to the 20 units mark (= 100 units) for breakfast and 16-18 units mark (= 80-90 units) for dinner, the amount varies depending on how much carb he eats for  dinner.  He stated that 20 units with dinner will usually cause hypoglycemia. - informed patient he will not be using a SSI while hospitalized and his diet will mostly likely be a carb modified diet; therefore, the amount of carb will likely be less than what he eats at home.  Patient and family agreeable to 80 units for dinner. - CBGs improving post Lantus 50 units SQ x1 and Novolog 20 units SQ x1 earlier today    Plan: - Insulin U-500, 100 units with breakfast and 80 units with dinner, both to start tomorrow.  Since patient has been NPO and he received Lantus and Novolog this AM, tonight's U-500 dose will be reduced to 50 units. - F/U CBGs and adjust as appropriate  - Med will be stored in main pharmacy and RN to pick up each doses    Kortney Schoenfelder D. Mina Marble, PharmD, BCPS Pager:  [phone] 07/16/2014, 1:40 PM

## 2014-07-17 DIAGNOSIS — I214 Non-ST elevation (NSTEMI) myocardial infarction: Secondary | ICD-10-CM | POA: Diagnosis not present

## 2014-07-17 DIAGNOSIS — I25708 Atherosclerosis of coronary artery bypass graft(s), unspecified, with other forms of angina pectoris: Secondary | ICD-10-CM | POA: Diagnosis not present

## 2014-07-17 DIAGNOSIS — I25118 Atherosclerotic heart disease of native coronary artery with other forms of angina pectoris: Secondary | ICD-10-CM | POA: Diagnosis not present

## 2014-07-17 LAB — BASIC METABOLIC PANEL
Anion gap: 9 (ref 5–15)
BUN: 13 mg/dL (ref 6–20)
CO2: 26 mmol/L (ref 22–32)
Calcium: 8.5 mg/dL — ABNORMAL LOW (ref 8.9–10.3)
Chloride: 103 mmol/L (ref 101–111)
Creatinine, Ser: 0.95 mg/dL (ref 0.61–1.24)
GFR calc Af Amer: >60 mL/min (ref >60)
GFR calc non Af Amer: >60 mL/min (ref >60)
Glucose, Bld: 168 mg/dL — ABNORMAL HIGH (ref 65–99)
Potassium: 3.6 mmol/L (ref 3.5–5.1)
Sodium: 138 mmol/L (ref 135–145)

## 2014-07-17 LAB — CBC
HCT: 36.8 % — ABNORMAL LOW (ref 39.0–52.0)
Hemoglobin: 12.5 g/dL — ABNORMAL LOW (ref 13.0–17.0)
MCH: 30.2 pg (ref 26.0–34.0)
MCHC: 34 g/dL (ref 30.0–36.0)
MCV: 88.9 fL (ref 78.0–100.0)
Platelets: 197 10*3/uL (ref 150–400)
RBC: 4.14 MIL/uL — ABNORMAL LOW (ref 4.22–5.81)
RDW: 12.6 % (ref 11.5–15.5)
WBC: 8.9 10*3/uL (ref 4.0–10.5)

## 2014-07-17 LAB — TROPONIN I: Troponin I: 2.65 ng/mL (ref <0.031)

## 2014-07-17 LAB — HEMOGLOBIN A1C
Hgb A1c MFr Bld: 12.9 % — ABNORMAL HIGH (ref 4.8–5.6)
Mean Plasma Glucose: 324 mg/dL

## 2014-07-17 LAB — GLUCOSE, CAPILLARY
Glucose-Capillary: 71 mg/dL (ref 65–99)
Glucose-Capillary: 191 mg/dL — ABNORMAL HIGH (ref 65–99)

## 2014-07-17 MED FILL — Heparin Sodium (Porcine) 2 Unit/ML in Sodium Chloride 0.9%: INTRAMUSCULAR | Qty: 1000 | Status: AC

## 2014-07-17 NOTE — Discharge Instructions (Signed)
Coronary Angiogram °A coronary angiogram, also called coronary angiography, is an X-ray procedure used to look at the arteries in the heart. In this procedure, a dye (contrast dye) is injected through a long, hollow tube (catheter). The catheter is about the size of a piece of cooked spaghetti and is inserted through your groin, wrist, or arm. The dye is injected into each artery, and X-rays are then taken to show if there is a blockage in the arteries of your heart. °LET YOUR HEALTH CARE PROVIDER KNOW ABOUT: °· Any allergies you have, including allergies to shellfish or contrast dye.   °· All medicines you are taking, including vitamins, herbs, eye drops, creams, and over-the-counter medicines.   °· Previous problems you or members of your family have had with the use of anesthetics.   °· Any blood disorders you have.   °· Previous surgeries you have had. °· History of kidney problems or failure.   °· Other medical conditions you have. °RISKS AND COMPLICATIONS  °Generally, a coronary angiogram is a safe procedure. However, problems can occur and include: °· Allergic reaction to the dye. °· Bleeding from the access site or other locations. °· Kidney injury, especially in people with impaired kidney function.  °· Stroke (rare). °· Heart attack (rare). °BEFORE THE PROCEDURE  °· Do not eat or drink anything after midnight the night before the procedure or as directed by your health care provider.   °· Ask your health care provider about changing or stopping your regular medicines. This is especially important if you are taking diabetes medicines or blood thinners. °PROCEDURE °· You may be given a medicine to help you relax (sedative) before the procedure. This medicine is given through an intravenous (IV) access tube that is inserted into one of your veins.   °· The area where the catheter will be inserted will be washed and shaved. This is usually done in the groin but may be done in the fold of your arm (near your  elbow) or in the wrist.    °· A medicine will be given to numb the area where the catheter will be inserted (local anesthetic).   °· The health care provider will insert the catheter into an artery. The catheter will be guided by using a special type of X-ray (fluoroscopy) of the blood vessel being examined.   °· A special dye will then be injected into the catheter, and X-rays will be taken. The dye will help to show where any narrowing or blockages are located in the heart arteries.   °AFTER THE PROCEDURE  °· If the procedure is done through the leg, you will be kept in bed lying flat for several hours. You will be instructed to not bend or cross your legs. °· The insertion site will be checked frequently.   °· The pulse in your feet or wrist will be checked frequently.   °· Additional blood tests, X-rays, and an electrocardiogram may be done.   °Document Released: 07/18/2002 Document Revised: 05/28/2013 Document Reviewed: 06/05/2012 °Exit[employer]® Patient Information ©2015 [employer]. This information is not intended to replace advice given to you by your health care provider. Make sure you discuss any questions you have with your health care provider. ° °

## 2014-07-17 NOTE — Progress Notes (Signed)
CARDIAC REHAB PHASE I   PRE:  Rate/Rhythm: 72 SR  BP:  Sitting: 132/67        SaO2: 96 RA  MODE:  Ambulation: 1000 ft   POST:  Rate/Rhythm: 82 SR  BP:  Sitting: 143/67         SaO2: 98 RA  Pt lying in bed, states he feels tired this morning. Pt ambulated 1000 ft on RA, independent, steady gait, tolerated well.  Pt c/o of mild DOE, denies cp, dizziness, standing rest x 1. Completed MI/PCI education with pt wife at bedside.  Reviewed risk factors, anti-platelet therapy, activity restrictions, ntg, exercise, heart healthy diet, carb counting, portion control, daily weights, sodium and fluid restrictions, and phase 2 cardiac rehab. Pt verbalized understanding. Pt states "I'm here because my sugar is out of control." Pt reports non-compliance of insulin, states "I get tired of sticking myself with a needle." Pt also repots not checking his blood sugar levels at home. Reinforced importance of medication compliance, portion control and checking blood sugars at least daily. Pt verbalized understanding also states he and his wife have "changed their diet in the past year" and sees a dietician at the [place].  Pt agrees to phase 2 cardiac rehab. Will send referral to [facility]. Pt and wife state they have no further questions at this time.  Pt to bed after walk per pt request, call bell within reach.     [phone]   Lenna Sciara, RN, BSN 07/17/2014 10:17 AM

## 2014-07-17 NOTE — Discharge Summary (Signed)
Physician Discharge Summary  Patient ID: Martin Bush MRN: [id number] DOB/AGE: Jul 04, 1955 59 y.o.  Admit date: 07/15/2014 Discharge date: 07/17/2014  Primary Discharge Diagnosis: NSTEMI  Secondary Discharge Diagnosis: CAD, HTN, HLD, DM type II  Significant Diagnostic Studies:  Coronary angiogram 07/16/2014: Occluded SVG to circumflex. Dominant circumflex. LAD occluded in the proximal segment, distally supplied by LIMA. LIMA to LAD patent. RCA small, nondominant and diffusely diseased.  Multiple stents evident in the proximal and mid circumflex coronary artery and large OM1. Large OM1 has in-stent 99% stenosis in the proximal and ostial segments. Successful but complex Balloon angioplasty and scoring balloon angioplasty of the proximal circumflex, OM1 followed by kissing balloon angioplasty of the OM1 and mid circumflex coronary artery. Stenosis reduced in both the vessels from 99% to 0%.  Hospital Course: Martin Bush is a 59 y.o. malewith a past medical history of HTN, HLD, uncontrolled DM, and significant CAD who presented to the ED on 07/15/2014 with sudden onset midsternal chest pain at 10:30pm while sitting on the couch watching TV. He reported associated dyspnea and diaphoresis, denies N/V. Pt was markedly hypertensive when EMS arrived which improved with SL Ntg administration x 1. Pain improved with Ntg and was relieved completely with morphine. Pt reported a 1 week history of progressively worsening fatigue. Pt recently moved to [facility] and had previously been seeing [facility] in [place], [place].   Cardiac history is as follows: 2000: 2 stents (unknown location) 2002: CABG x 2 - SVG to OM (per future stent card), unsure of 2nd bypass 03/25/09: 3.5 x 15 and 3.5 x 28 Promus DES to SVG-OM 07/16/10: 2.75 x 12 Xience DES to OM 01/15/11: 3.5 x 15 Resolute Integrity DES to prox Cx 07/18/13: 3 x 18 Resolute DES to dist Cx and 4 x 8 Xience Alpine DES to left main  Pt has been on  DAPT with ASA and Effient since last stent placement 1 year ago. Per pt report, this was the only time he presented with ACS. Pt reports compliance with medications. He is a nonsmoker. Remote history of light cigar use. Pt with increasing troponin, no significant EKG changes, was taken to the cath lab for diagnosis of NSTEMI.  He underwent difficult but successful balloon angioplasty to OM1 and proximal and mid circumflex coronary arteries with excellent results and no immediate complications.   Has walked this morning with no recurrence of chest pain. Feeling well and ready to go home. Wife present at bedside.  Recommendations on discharge: Follow up outpatient in 1-2 weeks. Patient is already on DAPT with Effient and ASA, statin and beta blocker.  Continue present medical therapy.    Discharge Exam: Blood pressure 128/72, pulse 61, temperature 98 F (36.7 C), temperature source Oral, resp. rate 15, height 5\' 7"  (1.702 m), weight 103.3 kg (227 lb 11.8 oz), SpO2 100 %.    General appearance: alert, cooperative, appears older than stated age, no distress and moderately obese Eyes: negative Neck: no adenopathy, no carotid bruit, no JVD, supple, symmetrical, trachea midline and thyroid not enlarged, symmetric, no tenderness/mass/nodules Resp: clear to auscultation bilaterally Chest wall: no tenderness Cardio: regular rate and rhythm, S1, S2 normal, no murmur, click, rub or gallop and distant heart sounds GI: soft, non-tender; bowel sounds normal; no masses, no organomegaly Extremities: extremities normal, atraumatic, no cyanosis or edema Pulses: 2+ and symmetric, right femoral access site asymptomatic Skin: Skin color, texture, turgor normal. No rashes or lesions Neurologic: Grossly normal  Labs:   Lab Results  Component  Value Date   WBC 8.9 07/17/2014   HGB 12.5* 07/17/2014   HCT 36.8* 07/17/2014   MCV 88.9 07/17/2014   PLT 197 07/17/2014    Recent Labs Lab 07/16/14 0022  07/17/14 0337  NA 131* 138  K 4.6 3.6  CL 97* 103  CO2 25 26  BUN 21* 13  CREATININE 1.35* 0.95  CALCIUM 9.1 8.5*  PROT 6.5  --   BILITOT 0.9  --   ALKPHOS 92  --   ALT 17  --   AST 19  --   GLUCOSE 572* 168*    Lipid Panel     Component Value Date/Time   CHOL 145 07/16/2014 0310   TRIG 223* 07/16/2014 0310   HDL 26* 07/16/2014 0310   CHOLHDL 5.6 07/16/2014 0310   VLDL 45* 07/16/2014 0310   LDLCALC 74 07/16/2014 0310    BNP (last 3 results)  Recent Labs  07/16/14 0022  BNP 65.7    ProBNP (last 3 results) No results for input(s): PROBNP in the last 8760 hours.  HEMOGLOBIN A1C Lab Results  Component Value Date   HGBA1C 12.9* 07/16/2014   MPG 324 07/16/2014    Cardiac Panel (last 3 results)  Recent Labs  07/16/14 0820 07/16/14 1830 07/17/14 0951  TROPONINI 3.81* 6.39* 2.65*    Lab Results  Component Value Date   TROPONINI 6.39* 07/16/2014     TSH  Recent Labs  07/16/14 0413  TSH 2.068   EKG 07/17/2014: sinus rhythm at a rate of 62 bpm, LAD, LAFB, PRWP. Diffuse T wave inversion in inferior and lateral leads, prolonged QT, new since prior.  EKG 07/16/2014: sinus tachycardia at 106bpm, PAC, ILBBB, PRWP, no prior for comparison.  Radiology: Dg Chest 2 View  07/16/2014   CLINICAL DATA:  Patient with shortness of breath and chest pain.  EXAM: CHEST  2 VIEW  COMPARISON:  None.  FINDINGS: Patient status post median sternotomy and CABG procedure. Enlarged cardiac and mediastinal contours. No consolidative pulmonary opacities. No pleural effusion or pneumothorax. Mid thoracic spine degenerative changes.  IMPRESSION: Cardiomegaly.  No acute cardiopulmonary process.   Electronically Signed   By: Lovey Newcomer M.D.   On: 07/16/2014 00:58      FOLLOW UP PLANS AND APPOINTMENTS    Medication List    TAKE these medications        aspirin EC 81 MG tablet  Take 81 mg by mouth daily.     atorvastatin 80 MG tablet  Commonly known as:  LIPITOR  Take 80 mg  by mouth daily.     FISH OIL PO  Take 1 tablet by mouth daily.     HUMULIN R 500 UNIT/ML injection  Generic drug:  insulin regular human CONCENTRATED  Inject 100 Units into the skin 2 (two) times daily with a meal.     lisinopril 40 MG tablet  Commonly known as:  PRINIVIL,ZESTRIL  Take 40 mg by mouth daily.     metFORMIN 1000 MG tablet  Commonly known as:  GLUCOPHAGE  Take 1,000 mg by mouth 2 (two) times daily with a meal.     metoprolol 50 MG tablet  Commonly known as:  LOPRESSOR  Take 50 mg by mouth 2 (two) times daily.     prasugrel 10 MG Tabs tablet  Commonly known as:  EFFIENT  Take 10 mg by mouth daily.     VITAMIN D PO  Take 1 tablet by mouth daily.  Follow-up Information    Follow up with Adrian Prows, MD In 1 week.   Specialty:  Cardiology   Why:  call for appointment   Contact information:   [address] [place] [address] [place], NP-C 07/17/2014, 8:15 AM [facility] Pager: [phone] Office: [phone]

## 2014-07-19 ENCOUNTER — Encounter (HOSPITAL_COMMUNITY): Payer: Self-pay | Admitting: Cardiology

## 2014-08-02 DIAGNOSIS — Z9861 Coronary angioplasty status: Secondary | ICD-10-CM | POA: Diagnosis not present

## 2014-08-02 DIAGNOSIS — I25810 Atherosclerosis of coronary artery bypass graft(s) without angina pectoris: Secondary | ICD-10-CM | POA: Diagnosis not present

## 2014-08-02 DIAGNOSIS — Z951 Presence of aortocoronary bypass graft: Secondary | ICD-10-CM | POA: Diagnosis not present

## 2014-08-02 DIAGNOSIS — I2510 Atherosclerotic heart disease of native coronary artery without angina pectoris: Secondary | ICD-10-CM | POA: Diagnosis not present

## 2014-10-26 DIAGNOSIS — J189 Pneumonia, unspecified organism: Secondary | ICD-10-CM

## 2014-10-26 HISTORY — DX: Pneumonia, unspecified organism: J18.9

## 2014-10-29 DIAGNOSIS — Z95810 Presence of automatic (implantable) cardiac defibrillator: Secondary | ICD-10-CM

## 2014-10-29 HISTORY — DX: Presence of automatic (implantable) cardiac defibrillator: Z95.810

## 2014-10-30 DIAGNOSIS — R0602 Shortness of breath: Secondary | ICD-10-CM | POA: Diagnosis not present

## 2014-11-07 DIAGNOSIS — E785 Hyperlipidemia, unspecified: Secondary | ICD-10-CM | POA: Diagnosis not present

## 2014-11-07 DIAGNOSIS — E119 Type 2 diabetes mellitus without complications: Secondary | ICD-10-CM | POA: Diagnosis not present

## 2014-11-07 DIAGNOSIS — Z794 Long term (current) use of insulin: Secondary | ICD-10-CM | POA: Diagnosis not present

## 2014-11-07 DIAGNOSIS — J984 Other disorders of lung: Secondary | ICD-10-CM | POA: Diagnosis not present

## 2014-11-07 DIAGNOSIS — I2510 Atherosclerotic heart disease of native coronary artery without angina pectoris: Secondary | ICD-10-CM | POA: Diagnosis not present

## 2014-11-07 DIAGNOSIS — E1165 Type 2 diabetes mellitus with hyperglycemia: Secondary | ICD-10-CM | POA: Diagnosis not present

## 2014-11-07 DIAGNOSIS — I10 Essential (primary) hypertension: Secondary | ICD-10-CM | POA: Diagnosis not present

## 2014-11-07 DIAGNOSIS — J181 Lobar pneumonia, unspecified organism: Secondary | ICD-10-CM | POA: Diagnosis not present

## 2014-11-07 DIAGNOSIS — I6502 Occlusion and stenosis of left vertebral artery: Secondary | ICD-10-CM | POA: Diagnosis not present

## 2014-11-07 DIAGNOSIS — I6602 Occlusion and stenosis of left middle cerebral artery: Secondary | ICD-10-CM | POA: Diagnosis not present

## 2014-11-07 DIAGNOSIS — I25118 Atherosclerotic heart disease of native coronary artery with other forms of angina pectoris: Secondary | ICD-10-CM | POA: Diagnosis not present

## 2014-11-07 DIAGNOSIS — Z8249 Family history of ischemic heart disease and other diseases of the circulatory system: Secondary | ICD-10-CM | POA: Diagnosis not present

## 2014-11-07 DIAGNOSIS — I213 ST elevation (STEMI) myocardial infarction of unspecified site: Secondary | ICD-10-CM | POA: Diagnosis not present

## 2014-11-07 DIAGNOSIS — T82855A Stenosis of coronary artery stent, initial encounter: Secondary | ICD-10-CM | POA: Diagnosis not present

## 2014-11-07 DIAGNOSIS — Z951 Presence of aortocoronary bypass graft: Secondary | ICD-10-CM | POA: Diagnosis not present

## 2014-11-07 DIAGNOSIS — I130 Hypertensive heart and chronic kidney disease with heart failure and stage 1 through stage 4 chronic kidney disease, or unspecified chronic kidney disease: Secondary | ICD-10-CM | POA: Diagnosis not present

## 2014-11-07 DIAGNOSIS — E1122 Type 2 diabetes mellitus with diabetic chronic kidney disease: Secondary | ICD-10-CM | POA: Diagnosis not present

## 2014-11-07 DIAGNOSIS — Z833 Family history of diabetes mellitus: Secondary | ICD-10-CM | POA: Diagnosis not present

## 2014-11-07 DIAGNOSIS — T82898A Other specified complication of vascular prosthetic devices, implants and grafts, initial encounter: Secondary | ICD-10-CM | POA: Diagnosis not present

## 2014-11-07 DIAGNOSIS — E669 Obesity, unspecified: Secondary | ICD-10-CM | POA: Diagnosis not present

## 2014-11-07 DIAGNOSIS — I214 Non-ST elevation (NSTEMI) myocardial infarction: Secondary | ICD-10-CM | POA: Diagnosis not present

## 2014-11-07 DIAGNOSIS — Z79899 Other long term (current) drug therapy: Secondary | ICD-10-CM | POA: Diagnosis not present

## 2014-11-07 DIAGNOSIS — I447 Left bundle-branch block, unspecified: Secondary | ICD-10-CM | POA: Diagnosis not present

## 2014-11-07 DIAGNOSIS — E11319 Type 2 diabetes mellitus with unspecified diabetic retinopathy without macular edema: Secondary | ICD-10-CM | POA: Diagnosis not present

## 2014-11-07 DIAGNOSIS — I272 Other secondary pulmonary hypertension: Secondary | ICD-10-CM | POA: Diagnosis not present

## 2014-11-07 DIAGNOSIS — Z7982 Long term (current) use of aspirin: Secondary | ICD-10-CM | POA: Diagnosis not present

## 2014-11-07 DIAGNOSIS — I472 Ventricular tachycardia: Secondary | ICD-10-CM | POA: Diagnosis not present

## 2014-11-07 DIAGNOSIS — Z955 Presence of coronary angioplasty implant and graft: Secondary | ICD-10-CM | POA: Diagnosis not present

## 2014-11-07 DIAGNOSIS — Z45018 Encounter for adjustment and management of other part of cardiac pacemaker: Secondary | ICD-10-CM | POA: Diagnosis not present

## 2014-11-07 DIAGNOSIS — E7800 Pure hypercholesterolemia, unspecified: Secondary | ICD-10-CM | POA: Diagnosis not present

## 2014-11-07 DIAGNOSIS — Z743 Need for continuous supervision: Secondary | ICD-10-CM | POA: Diagnosis not present

## 2014-11-07 DIAGNOSIS — Z950 Presence of cardiac pacemaker: Secondary | ICD-10-CM | POA: Diagnosis not present

## 2014-11-07 DIAGNOSIS — Z6837 Body mass index (BMI) 37.0-37.9, adult: Secondary | ICD-10-CM | POA: Diagnosis not present

## 2014-11-07 DIAGNOSIS — I5021 Acute systolic (congestive) heart failure: Secondary | ICD-10-CM | POA: Diagnosis not present

## 2014-11-07 DIAGNOSIS — Z01818 Encounter for other preprocedural examination: Secondary | ICD-10-CM | POA: Diagnosis not present

## 2014-11-07 DIAGNOSIS — I6523 Occlusion and stenosis of bilateral carotid arteries: Secondary | ICD-10-CM | POA: Diagnosis not present

## 2014-11-07 DIAGNOSIS — I5023 Acute on chronic systolic (congestive) heart failure: Secondary | ICD-10-CM | POA: Diagnosis not present

## 2014-11-07 DIAGNOSIS — G4733 Obstructive sleep apnea (adult) (pediatric): Secondary | ICD-10-CM | POA: Diagnosis not present

## 2014-11-07 DIAGNOSIS — I252 Old myocardial infarction: Secondary | ICD-10-CM | POA: Diagnosis not present

## 2014-11-07 DIAGNOSIS — R918 Other nonspecific abnormal finding of lung field: Secondary | ICD-10-CM | POA: Diagnosis not present

## 2014-11-07 DIAGNOSIS — R0989 Other specified symptoms and signs involving the circulatory and respiratory systems: Secondary | ICD-10-CM | POA: Diagnosis not present

## 2014-11-07 DIAGNOSIS — I255 Ischemic cardiomyopathy: Secondary | ICD-10-CM | POA: Diagnosis not present

## 2014-11-07 DIAGNOSIS — N179 Acute kidney failure, unspecified: Secondary | ICD-10-CM | POA: Diagnosis not present

## 2014-11-07 DIAGNOSIS — N183 Chronic kidney disease, stage 3 (moderate): Secondary | ICD-10-CM | POA: Diagnosis not present

## 2014-11-07 DIAGNOSIS — Z4502 Encounter for adjustment and management of automatic implantable cardiac defibrillator: Secondary | ICD-10-CM | POA: Diagnosis not present

## 2014-11-07 DIAGNOSIS — I5022 Chronic systolic (congestive) heart failure: Secondary | ICD-10-CM | POA: Diagnosis not present

## 2014-11-07 DIAGNOSIS — Z01810 Encounter for preprocedural cardiovascular examination: Secondary | ICD-10-CM | POA: Diagnosis not present

## 2014-11-07 DIAGNOSIS — R0602 Shortness of breath: Secondary | ICD-10-CM | POA: Diagnosis not present

## 2014-11-07 DIAGNOSIS — Z87891 Personal history of nicotine dependence: Secondary | ICD-10-CM | POA: Diagnosis not present

## 2014-11-07 DIAGNOSIS — Z452 Encounter for adjustment and management of vascular access device: Secondary | ICD-10-CM | POA: Diagnosis not present

## 2014-11-07 DIAGNOSIS — R0682 Tachypnea, not elsewhere classified: Secondary | ICD-10-CM | POA: Diagnosis not present

## 2014-11-07 DIAGNOSIS — I259 Chronic ischemic heart disease, unspecified: Secondary | ICD-10-CM | POA: Diagnosis not present

## 2014-11-07 DIAGNOSIS — R55 Syncope and collapse: Secondary | ICD-10-CM | POA: Diagnosis not present

## 2014-11-07 DIAGNOSIS — J690 Pneumonitis due to inhalation of food and vomit: Secondary | ICD-10-CM | POA: Diagnosis not present

## 2014-11-07 DIAGNOSIS — J189 Pneumonia, unspecified organism: Secondary | ICD-10-CM | POA: Diagnosis not present

## 2014-11-07 DIAGNOSIS — R739 Hyperglycemia, unspecified: Secondary | ICD-10-CM | POA: Diagnosis not present

## 2014-11-11 DIAGNOSIS — I255 Ischemic cardiomyopathy: Secondary | ICD-10-CM | POA: Diagnosis not present

## 2014-11-11 DIAGNOSIS — R918 Other nonspecific abnormal finding of lung field: Secondary | ICD-10-CM | POA: Diagnosis not present

## 2014-11-11 DIAGNOSIS — I214 Non-ST elevation (NSTEMI) myocardial infarction: Secondary | ICD-10-CM | POA: Diagnosis not present

## 2014-11-26 DIAGNOSIS — I472 Ventricular tachycardia: Secondary | ICD-10-CM | POA: Diagnosis not present

## 2014-11-29 DIAGNOSIS — I2510 Atherosclerotic heart disease of native coronary artery without angina pectoris: Secondary | ICD-10-CM | POA: Diagnosis not present

## 2014-12-01 DIAGNOSIS — I5023 Acute on chronic systolic (congestive) heart failure: Secondary | ICD-10-CM | POA: Diagnosis not present

## 2014-12-01 DIAGNOSIS — I2510 Atherosclerotic heart disease of native coronary artery without angina pectoris: Secondary | ICD-10-CM | POA: Diagnosis not present

## 2014-12-01 DIAGNOSIS — R7989 Other specified abnormal findings of blood chemistry: Secondary | ICD-10-CM | POA: Diagnosis not present

## 2014-12-01 DIAGNOSIS — Z951 Presence of aortocoronary bypass graft: Secondary | ICD-10-CM | POA: Diagnosis not present

## 2014-12-01 DIAGNOSIS — I5021 Acute systolic (congestive) heart failure: Secondary | ICD-10-CM | POA: Diagnosis not present

## 2014-12-01 DIAGNOSIS — E785 Hyperlipidemia, unspecified: Secondary | ICD-10-CM | POA: Diagnosis not present

## 2014-12-01 DIAGNOSIS — I517 Cardiomegaly: Secondary | ICD-10-CM | POA: Diagnosis not present

## 2014-12-01 DIAGNOSIS — R079 Chest pain, unspecified: Secondary | ICD-10-CM | POA: Diagnosis not present

## 2014-12-01 DIAGNOSIS — I472 Ventricular tachycardia: Secondary | ICD-10-CM | POA: Diagnosis not present

## 2014-12-01 DIAGNOSIS — I10 Essential (primary) hypertension: Secondary | ICD-10-CM | POA: Diagnosis not present

## 2014-12-01 DIAGNOSIS — Z8249 Family history of ischemic heart disease and other diseases of the circulatory system: Secondary | ICD-10-CM | POA: Diagnosis not present

## 2014-12-01 DIAGNOSIS — E1165 Type 2 diabetes mellitus with hyperglycemia: Secondary | ICD-10-CM | POA: Diagnosis not present

## 2014-12-01 DIAGNOSIS — I255 Ischemic cardiomyopathy: Secondary | ICD-10-CM | POA: Diagnosis not present

## 2014-12-01 DIAGNOSIS — Z79899 Other long term (current) drug therapy: Secondary | ICD-10-CM | POA: Diagnosis not present

## 2014-12-02 DIAGNOSIS — I5021 Acute systolic (congestive) heart failure: Secondary | ICD-10-CM | POA: Diagnosis not present

## 2014-12-04 DIAGNOSIS — I472 Ventricular tachycardia: Secondary | ICD-10-CM | POA: Diagnosis not present

## 2014-12-06 DIAGNOSIS — I429 Cardiomyopathy, unspecified: Secondary | ICD-10-CM | POA: Diagnosis not present

## 2014-12-10 DIAGNOSIS — I429 Cardiomyopathy, unspecified: Secondary | ICD-10-CM | POA: Diagnosis not present

## 2014-12-23 DIAGNOSIS — I429 Cardiomyopathy, unspecified: Secondary | ICD-10-CM | POA: Diagnosis not present

## 2014-12-25 DIAGNOSIS — I2510 Atherosclerotic heart disease of native coronary artery without angina pectoris: Secondary | ICD-10-CM | POA: Diagnosis not present

## 2014-12-25 DIAGNOSIS — I25810 Atherosclerosis of coronary artery bypass graft(s) without angina pectoris: Secondary | ICD-10-CM | POA: Diagnosis not present

## 2014-12-25 DIAGNOSIS — Z95810 Presence of automatic (implantable) cardiac defibrillator: Secondary | ICD-10-CM | POA: Diagnosis not present

## 2014-12-25 DIAGNOSIS — E782 Mixed hyperlipidemia: Secondary | ICD-10-CM | POA: Diagnosis not present

## 2015-01-03 DIAGNOSIS — I2510 Atherosclerotic heart disease of native coronary artery without angina pectoris: Secondary | ICD-10-CM | POA: Diagnosis not present

## 2015-01-08 DIAGNOSIS — I472 Ventricular tachycardia: Secondary | ICD-10-CM | POA: Diagnosis not present

## 2015-01-10 DIAGNOSIS — I472 Ventricular tachycardia: Secondary | ICD-10-CM | POA: Diagnosis not present

## 2015-01-21 DIAGNOSIS — Z7982 Long term (current) use of aspirin: Secondary | ICD-10-CM | POA: Diagnosis not present

## 2015-01-21 DIAGNOSIS — I5040 Unspecified combined systolic (congestive) and diastolic (congestive) heart failure: Secondary | ICD-10-CM | POA: Diagnosis not present

## 2015-01-21 DIAGNOSIS — Z8249 Family history of ischemic heart disease and other diseases of the circulatory system: Secondary | ICD-10-CM | POA: Diagnosis not present

## 2015-01-21 DIAGNOSIS — E785 Hyperlipidemia, unspecified: Secondary | ICD-10-CM | POA: Diagnosis not present

## 2015-01-21 DIAGNOSIS — G8929 Other chronic pain: Secondary | ICD-10-CM | POA: Diagnosis not present

## 2015-01-21 DIAGNOSIS — E669 Obesity, unspecified: Secondary | ICD-10-CM | POA: Diagnosis not present

## 2015-01-21 DIAGNOSIS — J811 Chronic pulmonary edema: Secondary | ICD-10-CM | POA: Diagnosis not present

## 2015-01-21 DIAGNOSIS — R079 Chest pain, unspecified: Secondary | ICD-10-CM | POA: Diagnosis not present

## 2015-01-21 DIAGNOSIS — I129 Hypertensive chronic kidney disease with stage 1 through stage 4 chronic kidney disease, or unspecified chronic kidney disease: Secondary | ICD-10-CM | POA: Diagnosis not present

## 2015-01-21 DIAGNOSIS — M549 Dorsalgia, unspecified: Secondary | ICD-10-CM | POA: Diagnosis not present

## 2015-01-21 DIAGNOSIS — I252 Old myocardial infarction: Secondary | ICD-10-CM | POA: Diagnosis not present

## 2015-01-21 DIAGNOSIS — R0600 Dyspnea, unspecified: Secondary | ICD-10-CM | POA: Diagnosis not present

## 2015-01-21 DIAGNOSIS — Z794 Long term (current) use of insulin: Secondary | ICD-10-CM | POA: Diagnosis not present

## 2015-01-21 DIAGNOSIS — N183 Chronic kidney disease, stage 3 (moderate): Secondary | ICD-10-CM | POA: Diagnosis not present

## 2015-01-21 DIAGNOSIS — E1165 Type 2 diabetes mellitus with hyperglycemia: Secondary | ICD-10-CM | POA: Diagnosis not present

## 2015-01-21 DIAGNOSIS — Z950 Presence of cardiac pacemaker: Secondary | ICD-10-CM | POA: Diagnosis not present

## 2015-01-21 DIAGNOSIS — Z6837 Body mass index (BMI) 37.0-37.9, adult: Secondary | ICD-10-CM | POA: Diagnosis not present

## 2015-01-21 DIAGNOSIS — I429 Cardiomyopathy, unspecified: Secondary | ICD-10-CM | POA: Diagnosis not present

## 2015-01-21 DIAGNOSIS — J069 Acute upper respiratory infection, unspecified: Secondary | ICD-10-CM | POA: Diagnosis not present

## 2015-01-21 DIAGNOSIS — Z951 Presence of aortocoronary bypass graft: Secondary | ICD-10-CM | POA: Diagnosis not present

## 2015-01-21 DIAGNOSIS — I2510 Atherosclerotic heart disease of native coronary artery without angina pectoris: Secondary | ICD-10-CM | POA: Diagnosis not present

## 2015-01-21 DIAGNOSIS — G4733 Obstructive sleep apnea (adult) (pediatric): Secondary | ICD-10-CM | POA: Diagnosis not present

## 2015-01-21 DIAGNOSIS — R0982 Postnasal drip: Secondary | ICD-10-CM | POA: Diagnosis not present

## 2015-01-22 DIAGNOSIS — I5022 Chronic systolic (congestive) heart failure: Secondary | ICD-10-CM | POA: Diagnosis not present

## 2015-01-22 DIAGNOSIS — Z794 Long term (current) use of insulin: Secondary | ICD-10-CM | POA: Diagnosis not present

## 2015-01-22 DIAGNOSIS — E1165 Type 2 diabetes mellitus with hyperglycemia: Secondary | ICD-10-CM | POA: Diagnosis not present

## 2015-01-22 DIAGNOSIS — J811 Chronic pulmonary edema: Secondary | ICD-10-CM | POA: Diagnosis not present

## 2015-01-22 DIAGNOSIS — R0609 Other forms of dyspnea: Secondary | ICD-10-CM | POA: Diagnosis not present

## 2015-01-23 DIAGNOSIS — Z794 Long term (current) use of insulin: Secondary | ICD-10-CM | POA: Diagnosis not present

## 2015-01-23 DIAGNOSIS — R0609 Other forms of dyspnea: Secondary | ICD-10-CM | POA: Diagnosis not present

## 2015-01-23 DIAGNOSIS — E1165 Type 2 diabetes mellitus with hyperglycemia: Secondary | ICD-10-CM | POA: Diagnosis not present

## 2015-01-23 DIAGNOSIS — I517 Cardiomegaly: Secondary | ICD-10-CM | POA: Diagnosis not present

## 2015-01-24 DIAGNOSIS — I5020 Unspecified systolic (congestive) heart failure: Secondary | ICD-10-CM | POA: Diagnosis not present

## 2015-01-24 DIAGNOSIS — I5022 Chronic systolic (congestive) heart failure: Secondary | ICD-10-CM | POA: Diagnosis not present

## 2015-01-24 DIAGNOSIS — E1165 Type 2 diabetes mellitus with hyperglycemia: Secondary | ICD-10-CM | POA: Diagnosis not present

## 2015-01-24 DIAGNOSIS — G4733 Obstructive sleep apnea (adult) (pediatric): Secondary | ICD-10-CM | POA: Diagnosis not present

## 2015-01-24 DIAGNOSIS — J069 Acute upper respiratory infection, unspecified: Secondary | ICD-10-CM | POA: Diagnosis not present

## 2015-01-24 DIAGNOSIS — Z794 Long term (current) use of insulin: Secondary | ICD-10-CM | POA: Diagnosis not present

## 2015-01-24 DIAGNOSIS — B9789 Other viral agents as the cause of diseases classified elsewhere: Secondary | ICD-10-CM | POA: Diagnosis not present

## 2015-01-25 DIAGNOSIS — I5020 Unspecified systolic (congestive) heart failure: Secondary | ICD-10-CM | POA: Diagnosis not present

## 2015-01-25 DIAGNOSIS — I5022 Chronic systolic (congestive) heart failure: Secondary | ICD-10-CM | POA: Diagnosis not present

## 2015-01-25 DIAGNOSIS — B9789 Other viral agents as the cause of diseases classified elsewhere: Secondary | ICD-10-CM | POA: Diagnosis not present

## 2015-01-25 DIAGNOSIS — J069 Acute upper respiratory infection, unspecified: Secondary | ICD-10-CM | POA: Diagnosis not present

## 2015-01-25 DIAGNOSIS — G4733 Obstructive sleep apnea (adult) (pediatric): Secondary | ICD-10-CM | POA: Diagnosis not present

## 2015-02-05 ENCOUNTER — Encounter: Payer: Self-pay | Admitting: Emergency Medicine

## 2015-02-05 ENCOUNTER — Encounter: Payer: Self-pay | Admitting: Family Medicine

## 2015-02-05 ENCOUNTER — Emergency Department: Payer: Medicare Other

## 2015-02-05 ENCOUNTER — Emergency Department
Admission: EM | Admit: 2015-02-05 | Discharge: 2015-02-05 | Disposition: A | Discharge status: Home / Self Care | Payer: Medicare Other | Attending: Student | Admitting: Student

## 2015-02-05 ENCOUNTER — Ambulatory Visit (INDEPENDENT_AMBULATORY_CARE_PROVIDER_SITE_OTHER): Payer: Medicare Other | Admitting: Family Medicine

## 2015-02-05 VITALS — BP 124/76 | HR 73 | Temp 97.6°F | Ht 67.5 in | Wt 244.0 lb

## 2015-02-05 DIAGNOSIS — Z79899 Other long term (current) drug therapy: Secondary | ICD-10-CM | POA: Insufficient documentation

## 2015-02-05 DIAGNOSIS — E860 Dehydration: Secondary | ICD-10-CM

## 2015-02-05 DIAGNOSIS — Z7984 Long term (current) use of oral hypoglycemic drugs: Secondary | ICD-10-CM | POA: Insufficient documentation

## 2015-02-05 DIAGNOSIS — Z794 Long term (current) use of insulin: Secondary | ICD-10-CM | POA: Insufficient documentation

## 2015-02-05 DIAGNOSIS — I10 Essential (primary) hypertension: Secondary | ICD-10-CM | POA: Insufficient documentation

## 2015-02-05 DIAGNOSIS — R42 Dizziness and giddiness: Secondary | ICD-10-CM

## 2015-02-05 DIAGNOSIS — E119 Type 2 diabetes mellitus without complications: Secondary | ICD-10-CM | POA: Insufficient documentation

## 2015-02-05 DIAGNOSIS — Z7982 Long term (current) use of aspirin: Secondary | ICD-10-CM | POA: Insufficient documentation

## 2015-02-05 LAB — URINALYSIS COMPLETE WITH MICROSCOPIC (ARMC ONLY)
Bacteria, UA: NONE SEEN
Bilirubin Urine: NEGATIVE
Glucose, UA: NEGATIVE mg/dL
Hgb urine dipstick: NEGATIVE
Ketones, ur: NEGATIVE mg/dL
Leukocytes, UA: NEGATIVE
Nitrite: NEGATIVE
Protein, ur: NEGATIVE mg/dL
Specific Gravity, Urine: 1.013 (ref 1.005–1.030)
Squamous Epithelial / LPF: NONE SEEN
pH: 5 (ref 5.0–8.0)

## 2015-02-05 LAB — BASIC METABOLIC PANEL
Anion gap: 9 (ref 5–15)
BUN: 50 mg/dL — ABNORMAL HIGH (ref 6–20)
CO2: 27 mmol/L (ref 22–32)
Calcium: 9.7 mg/dL (ref 8.9–10.3)
Chloride: 102 mmol/L (ref 101–111)
Creatinine, Ser: 1.83 mg/dL — ABNORMAL HIGH (ref 0.61–1.24)
GFR calc Af Amer: 45 mL/min — ABNORMAL LOW (ref >60)
GFR calc non Af Amer: 39 mL/min — ABNORMAL LOW (ref >60)
Glucose, Bld: 73 mg/dL (ref 65–99)
Potassium: 4.2 mmol/L (ref 3.5–5.1)
Sodium: 138 mmol/L (ref 135–145)

## 2015-02-05 LAB — GLUCOSE, CAPILLARY
Glucose-Capillary: 76 mg/dL (ref 65–99)
Glucose-Capillary: 85 mg/dL (ref 65–99)

## 2015-02-05 LAB — CBC
HCT: 37.7 % — ABNORMAL LOW (ref 40.0–52.0)
Hemoglobin: 12.5 g/dL — ABNORMAL LOW (ref 13.0–18.0)
MCH: 29.6 pg (ref 26.0–34.0)
MCHC: 33.2 g/dL (ref 32.0–36.0)
MCV: 89.3 fL (ref 80.0–100.0)
Platelets: 246 10*3/uL (ref 150–440)
RBC: 4.22 MIL/uL — ABNORMAL LOW (ref 4.40–5.90)
RDW: 14 % (ref 11.5–14.5)
WBC: 8.6 10*3/uL (ref 3.8–10.6)

## 2015-02-05 LAB — TROPONIN I: Troponin I: <0.03 ng/mL (ref <0.031)

## 2015-02-05 MED ORDER — SODIUM CHLORIDE 0.9 % IV BOLUS (SEPSIS)
500.0000 mL | Freq: Once | INTRAVENOUS | Status: AC
  Administered 2015-02-05: 500 mL via INTRAVENOUS

## 2015-02-05 NOTE — ED Notes (Signed)

## 2015-02-05 NOTE — Patient Instructions (Signed)
Nice to meet you. I have advised to go to the emergency room for evaluation of your symptoms. Please go immediately. If you develop chest pain, shortness of breath, palpitations, persistent lightheadedness, persistent dizziness, numbness, weakness, or vision changes, or any new or changing symptoms please call 911 immediately.

## 2015-02-05 NOTE — Progress Notes (Signed)
Patient ID: Martin Bush, male   DOB: 1955/09/27, 60 y.o.   MRN: [id number]  Tommi Rumps, MD Phone: [phone]  Martin Bush is a 60 y.o. male who presents today for new patient visit.  Patient presents complaining of dizziness and lightheadedness starting yesterday. This was sudden onset. Notes it is a mix of the room spinning and feeling lightheaded. Notes it was gone this morning though recurred early this morning. It is not positional. He has no current upper respiratory symptoms with this , though notes he did have upper respiratory infection a week and a half ago that had resolved a week and a half ago area. There are no palpitations with this. Denies headache, shortness of breath, chest pain, numbness, weakness, vision changes at this time. Notes he did have a headache last week in the back of his head that  last for an hour. This was gradual in onset. He rarely gets headaches. Does note he has a known carotid artery blockage. He has a history of an NSTEMI and CHF. He was hospitalized for his NSTEMI in June 2016. He had cardiac arrest in October 2016 with CHF. In October he had had shortness of breath and palpitations. His no chest pain, shortness breath, or edema at this time. He does note a history of an inner ear issues and vertigo in the past. Patient has been on Lasix,  spironolactone, and Coreg. He is also on amiodarone. He has a pacemaker in place.  Active Ambulatory Problems    Diagnosis Date Noted  . Chest pain 07/16/2014  . NSTEMI (non-ST elevated myocardial infarction) ([place]) 07/16/2014  . Dizziness 02/05/2015   Resolved Ambulatory Problems    Diagnosis Date Noted  . No Resolved Ambulatory Problems   Past Medical History  Diagnosis Date  . Coronary artery disease   . MI (myocardial infarction) ([place])   . High cholesterol   . Hypertension   . Proteinuria   . Sleep apnea   . Diabetes mellitus without complication ([place])   . Arthritis   . Depression   . Colon polyps       Family History  Problem Relation Age of Onset  . Uterine cancer Mother   . Lung cancer Mother   . Hyperlipidemia Father   . Heart disease Father   . Hypertension Father   . Diabetes Father     Social History   Social History  . Marital Status: Married    Spouse Name: N/A  . Number of Children: N/A  . Years of Education: N/A   Occupational History  . Not on file.   Social History Main Topics  . Smoking status: Never Smoker   . Smokeless tobacco: Former Systems developer  . Alcohol Use: No  . Drug Use: No  . Sexual Activity: Not on file   Other Topics Concern  . Not on file   Social History Narrative    ROS   General:  Negative for nexplained weight loss, fever Skin: Negative for new or changing mole, sore that won't heal HEENT:  Positive for trouble seeing, ringing in his ears ,Negative for trouble hearing, mouth sores, hoarseness, change in voice, dysphagia. CV:   Positive for shortness of breath, edema, irregular heartbeat (patient notes he has not had these things in several months) Negative for chest pain Resp:  Positive for cough, dyspnea ,Negative for  hemoptysis GI: Negative for nausea, vomiting, diarrhea, constipation, abdominal pain, melena, hematochezia. GU: Negative for dysuria, incontinence, urinary hesitance, hematuria, vaginal or penile discharge,  polyuria, sexual difficulty, lumps in testicle or breasts MSK: Negative for muscle cramps or aches, joint pain or swelling Neuro:  Positive for headaches, weakness, dizziness ,Negative for numbness, passing out/fainting Psych:  Positive for depression (recently started on Zoloft at [place]), memory problems, Negative for depression, anxiety  Objective  Physical Exam Filed Vitals:   02/05/15 1331  BP: 124/76  Pulse: 73  Temp: 97.6 F (36.4 C)    BP Readings from Last 3 Encounters:  02/05/15 124/76  02/05/15 107/65  07/17/14 128/72   Wt Readings from Last 3 Encounters:  02/05/15 244 lb (110.678 kg)  02/05/15  244 lb (110.678 kg)  07/17/14 227 lb 11.8 oz (103.3 kg)    laying blood pressure 138/90  Sitting blood pressure 122/80  Standing blood pressure 110/78  Physical Exam  Constitutional:  Intermittently appears visibly dizzy and lightheaded  HENT:  Head: Normocephalic and atraumatic.  Right Ear: External ear normal.  Left Ear: External ear normal.  Mouth/Throat: Oropharynx is clear and moist. No oropharyngeal exudate.  Normal TMs bilaterally, negative Dix-Hallpike  Eyes: Conjunctivae are normal. Pupils are equal, round, and reactive to light.  Neck: Neck supple.  Cardiovascular: Normal rate, regular rhythm and normal heart sounds.  Exam reveals no gallop and no friction rub.   No murmur heard. Pulmonary/Chest: Effort normal and breath sounds normal. No respiratory distress. He has no wheezes. He has no rales.  Abdominal: Soft. Bowel sounds are normal. He exhibits no distension. There is no tenderness. There is no rebound and no guarding.  Lymphadenopathy:    He has no cervical adenopathy.  Neurological: He is alert.  CN 2-12 intact, 5/5 strength in bilateral biceps, triceps, grip, quads, hamstrings, plantar and dorsiflexion, sensation to light touch intact in bilateral UE and LE, normal gait, 2+ patellar reflexes, no pronator drift, negative Romberg, normal finger to nose, patient did appear visibly dizzy just sitting there without change in position  Skin: Skin is warm and dry. He is not diaphoretic.  Psychiatric: Mood and affect normal.   EKG: Paced rhythm, rate 62  Assessment/Plan:   Dizziness Patient with onset of persistent dizziness yesterday. He appears neurologically intact today, though is visibly uncomfortable and dizzy just sitting there on exam today.  He does appear to be orthostatic on exam, though this would not explain persistent dizziness that occurs just while sitting there. Given his persistent symptoms and his risk factors for cardiac versus CVA cause of this we will  have him evaluated in the emergency room for consideration of cardiac workup and possible CT scan of his head. I discussed EMS transport given his persistent symptoms and risk factors, though patient and wife declined this and signed [place] paperwork. They will transport the patient themselves to [facility]. CMA will call the charge nurse to inform the patient is on his way.    Orders Placed This Encounter  Procedures  . EKG 12-Lead    Dragon voice recognition software was used during the dictation process of this note. If any phrases or words seem inappropriate it is likely secondary to the translation process being inefficient.  Tommi Rumps

## 2015-02-05 NOTE — Assessment & Plan Note (Addendum)
Patient with onset of persistent dizziness yesterday. He appears neurologically intact today, though is visibly uncomfortable and dizzy just sitting there on exam today.  He does appear to be orthostatic on exam, though this would not explain persistent dizziness that occurs just while sitting there. Given his persistent symptoms and his risk factors for cardiac versus CVA cause of this we will have him evaluated in the emergency room for consideration of cardiac workup and possible CT scan of his head. I discussed EMS transport given his persistent symptoms and risk factors, though patient and wife declined this and signed [place] paperwork. They will transport the patient themselves to [facility]. CMA will call the charge nurse to inform the patient is on his way.

## 2015-02-05 NOTE — ED Provider Notes (Addendum)
[facility] Emergency Department Provider Note  ____________________________________________  Time seen: Approximately 6:53 PM  I have reviewed the triage vital signs and the nursing notes.   HISTORY  Chief Complaint Dizziness    HPI Martin Bush is a 60 y.o. male history of coronary artery disease as post CABG, hypertension, hyperlipidemia, status post AICD/pacemaker present for evaluation of intermittent room spinning dizziness as well as lightheadedness since yesterday, gradual onset, now resolved, improves with lying down or sleeping. The patient reports that today he had a transient episode of room spinning dizziness that resolved this morning after he awoke from sleep however later today when he was taking a shower he develops room spinning dizziness again. He has intermittently felt mildly lightheaded. He has no lightheadedness or dizziness at this time. He denies any chest pain or difficulty breathing. No nausea, vomiting, diarrhea, fevers or chills. He had URI symptoms one week ago but those have resolved. He has not felt his AICD fire.   Past Medical History  Diagnosis Date  . Coronary artery disease   . MI (myocardial infarction) ([place])   . High cholesterol   . Hypertension   . Proteinuria   . Sleep apnea     uses cpap  . Diabetes mellitus without complication ([facility])     insulin dependent  . Arthritis     hands   . Depression   . Colon polyps     Patient Active Problem List   Diagnosis Date Noted  . Dizziness 02/05/2015  . Chest pain 07/16/2014  . NSTEMI (non-ST elevated myocardial infarction) ([place]) 07/16/2014    Past Surgical History  Procedure Laterality Date  . Cardiac surgery    . Joint replacement    . Cardiac catheterization N/A 07/16/2014    Procedure: Left Heart Cath and Coronary Angiography;  Surgeon: Adrian Prows, MD;  Location: [facility];  Service: Cardiovascular;  Laterality: N/A;  . Cardiac catheterization  07/16/2014     Procedure: Coronary Balloon Angioplasty;  Surgeon: Adrian Prows, MD;  Location: [facility];  Service: Cardiovascular;;    Current Outpatient Rx  Name  Route  Sig  Dispense  Refill  . amiodarone (PACERONE) 200 MG tablet               . aspirin EC 81 MG tablet   Oral   Take 81 mg by mouth daily.         [name] Kitchen atorvastatin (LIPITOR) 80 MG tablet   Oral   Take 80 mg by mouth daily. Reported on 02/05/2015         . carvedilol (COREG) 6.25 MG tablet               . Cholecalciferol (VITAMIN D PO)   Oral   Take 1 tablet by mouth daily.         . furosemide (LASIX) 40 MG tablet               . insulin regular human CONCENTRATED (HUMULIN R) 500 UNIT/ML injection   Subcutaneous   Inject 100 Units into the skin 2 (two) times daily with a meal.         . lisinopril (PRINIVIL,ZESTRIL) 40 MG tablet   Oral   Take 40 mg by mouth daily.         . metFORMIN (GLUCOPHAGE) 1000 MG tablet   Oral   Take 1,000 mg by mouth 2 (two) times daily with a meal. Reported on 02/05/2015         .  metoprolol (LOPRESSOR) 50 MG tablet   Oral   Take 50 mg by mouth 2 (two) times daily. Reported on 02/05/2015         . Omega-3 Fatty Acids (FISH OIL PO)   Oral   Take 1 tablet by mouth daily.         . prasugrel (EFFIENT) 10 MG TABS tablet   Oral   Take 10 mg by mouth daily.         . pravastatin (PRAVACHOL) 80 MG tablet   Oral   Take 80 mg by mouth daily.         . sertraline (ZOLOFT) 100 MG tablet   Oral   Take 100 mg by mouth daily.         [name] Kitchen spironolactone (ALDACTONE) 25 MG tablet                 Allergies Review of patient's allergies indicates no known allergies.  Family History  Problem Relation Age of Onset  . Uterine cancer Mother   . Lung cancer Mother   . Hyperlipidemia Father   . Heart disease Father   . Hypertension Father   . Diabetes Father     Social History Social History  Substance Use Topics  . Smoking status: Never Smoker    . Smokeless tobacco: Former Systems developer  . Alcohol Use: No    Review of Systems Constitutional: No fever/chills Eyes: No visual changes. ENT: No sore throat. Cardiovascular: Denies chest pain. Respiratory: Denies shortness of breath. Gastrointestinal: No abdominal pain.  No nausea, no vomiting.  No diarrhea.  No constipation. Genitourinary: Negative for dysuria. Musculoskeletal: Negative for back pain. Skin: Negative for rash. Neurological: Negative for headaches, focal weakness or numbness.  10-point ROS otherwise negative.  ____________________________________________   PHYSICAL EXAM:  VITAL SIGNS: ED Triage Vitals  Enc Vitals Group     BP 02/05/15 1441 107/65 mmHg     Pulse Rate 02/05/15 1441 61     Resp 02/05/15 1441 16     Temp 02/05/15 1441 97.7 F (36.5 C)     Temp Source 02/05/15 1441 Oral     SpO2 02/05/15 1441 95 %     Weight 02/05/15 1441 244 lb (110.678 kg)     Height 02/05/15 1441 5\' 7"  (1.702 m)     Head Cir --      Peak Flow --      Pain Score --      Pain Loc --      Pain Edu? --      Excl. in [place]? --     Constitutional: Alert and oriented. Well appearing and in no acute distress. Eyes: Conjunctivae are normal. PERRL. EOMI. Head: Atraumatic. Nose: No congestion/rhinnorhea. Mouth/Throat: Mucous membranes are moist.  Oropharynx non-erythematous. Neck: No stridor.  Cardiovascular: Normal rate, regular rhythm. Grossly normal heart sounds.  Good peripheral circulation. Respiratory: Normal respiratory effort.  No retractions. Lungs CTAB. Gastrointestinal: Soft and nontender. No distention. No CVA tenderness. Genitourinary: deferred Musculoskeletal: No lower extremity tenderness nor edema.  No joint effusions. Neurologic:  Normal speech and language. No gross focal neurologic deficits are appreciated. No gait instability. 5 out of 5 strength in bilateral upper and lower extremities, sensation intact to light touch throughout. Cranial nerves II through XII  intact. Normal finger-nose-finger. Skin:  Skin is warm, dry and intact. No rash noted. Psychiatric: Mood and affect are normal. Speech and behavior are normal.  ____________________________________________   LABS (all labs ordered are listed, but  only abnormal results are displayed)  Labs Reviewed  BASIC METABOLIC PANEL - Abnormal; Notable for the following:    BUN 50 (*)    Creatinine, Ser 1.83 (*)    GFR calc non Af Amer 39 (*)    GFR calc Af Amer 45 (*)    All other components within normal limits  CBC - Abnormal; Notable for the following:    RBC 4.22 (*)    Hemoglobin 12.5 (*)    HCT 37.7 (*)    All other components within normal limits  URINALYSIS COMPLETEWITH MICROSCOPIC ([facility] ONLY) - Abnormal; Notable for the following:    Color, Urine YELLOW (*)    APPearance CLEAR (*)    All other components within normal limits  GLUCOSE, CAPILLARY  TROPONIN I  CBG MONITORING, ED   ____________________________________________  EKG  ED ECG REPORT I, Joanne Gavel, the attending physician, personally viewed and interpreted this ECG.   Date: 02/05/2015  EKG Time: 14:37  Rate: 62  Rhythm: AV paced  ____________________________________________  RADIOLOGY  CT head IMPRESSION: Negative noncontrast head CT.   CXR FINDINGS: The heart size and mediastinal contours are within normal limits. Prior CABG noted. New triple lead transvenous pacemaker seen in expected position. No pneumothorax visualized.  Both lungs are clear. Mild pleural thickening again seen along the left lateral chest wall. The visualized skeletal structures are unremarkable.  IMPRESSION: No active cardiopulmonary disease. ____________________________________________   PROCEDURES  Procedure(s) performed: None  Critical Care performed: No  ____________________________________________   INITIAL IMPRESSION / ASSESSMENT AND PLAN / ED COURSE  Pertinent labs & imaging results that were available  during my care of the patient were reviewed by me and considered in my medical decision making (see chart for details).  Raeshawn Hartt is a 60 y.o. male history of coronary artery disease as post CABG, hypertension, hyperlipidemia, status post AICD/pacemaker present for evaluation of intermittent room spinning dizziness as well as lightheadedness since yesterday, early resolved. On exam, he is very well-appearing and in no acute distress. Vital signs are stable, he is afebrile. He has an intact neurological examination. His creatinine is elevated at 1.83, BUN is also elevated. I suspect his symptoms may be episodic dehydration and his doctors have been titrating his diuretic medications. IV fluids were given here. His dizziness resolves lately between episodes and has completely resolved at this time. His neurological exam is intact. His CT head is negative. I doubt that this represents CVA, specifically doubt posterior circulation CVA. He has no chest pain, no difficulty breathing, his Medtronic AICD/pacemaker was interrogated and there have been no events, pacemaker seems to be working well. His troponin is negative. Doubt ACS. We discussed holding his diuretic today and he will call his primary care doctor tomorrow for further instructions regarding his diuretics as I fear he is currently over diuresed. We discussed meticulous return precautions and he is comfortable with the discharge plan. DC home. ____________________________________________   FINAL CLINICAL IMPRESSION(S) / ED DIAGNOSES  Final diagnoses:  Dizziness  Dehydration      Joanne Gavel, MD 02/05/15 2058  Joanne Gavel, MD 02/05/15 2100

## 2015-02-05 NOTE — Progress Notes (Signed)
Pre visit review using our clinic review tool, if applicable. No additional management support is needed unless otherwise documented below in the visit note. 

## 2015-02-05 NOTE — ED Notes (Signed)
Pt here for dizziness since yesterday.

## 2015-02-12 ENCOUNTER — Ambulatory Visit (INDEPENDENT_AMBULATORY_CARE_PROVIDER_SITE_OTHER): Payer: Medicare Other | Admitting: Family Medicine

## 2015-02-12 ENCOUNTER — Encounter: Payer: Self-pay | Admitting: Family Medicine

## 2015-02-12 VITALS — BP 116/82 | HR 86 | Temp 97.8°F | Ht 67.5 in | Wt 246.4 lb

## 2015-02-12 DIAGNOSIS — G8929 Other chronic pain: Secondary | ICD-10-CM | POA: Diagnosis not present

## 2015-02-12 DIAGNOSIS — M545 Low back pain, unspecified: Secondary | ICD-10-CM

## 2015-02-12 DIAGNOSIS — R42 Dizziness and giddiness: Secondary | ICD-10-CM

## 2015-02-12 DIAGNOSIS — E119 Type 2 diabetes mellitus without complications: Secondary | ICD-10-CM | POA: Diagnosis not present

## 2015-02-12 DIAGNOSIS — Z794 Long term (current) use of insulin: Secondary | ICD-10-CM

## 2015-02-12 NOTE — Progress Notes (Signed)
Pre visit review using our clinic review tool, if applicable. No additional management support is needed unless otherwise documented below in the visit note. 

## 2015-02-12 NOTE — Patient Instructions (Signed)
Nice to see you. Please continue to monitor your back. You can take a muscle relaxer and tylenol for this. Please call with the name of your muscle relaxer.  Please follow-up with the ophthalmologist.  Please continue to monitor your blood sugar. If you develop increasing numbers of low blood sugars please let us know.  If you develop increasing pain, or fever, numbness, weakness, loss of bowel or bladder function, numbness between your legs, or any new or change in symptoms please seek medical attention.   Low Back Sprain With Rehab A sprain is an injury in which a ligament is torn. The ligaments of the lower back are vulnerable to sprains. However, they are strong and require great force to be injured. These ligaments are important for stabilizing the spinal column. Sprains are classified into three categories. Grade 1 sprains cause pain, but the tendon is not lengthened. Grade 2 sprains include a lengthened ligament, due to the ligament being stretched or partially ruptured. With grade 2 sprains there is still function, although the function may be decreased. Grade 3 sprains involve a complete tear of the tendon or muscle, and function is usually impaired. SYMPTOMS   Severe pain in the lower back.  Sometimes, a feeling of a "pop," "snap," or tear, at the time of injury.  Tenderness and sometimes swelling at the injury site.  Uncommonly, bruising (contusion) within 48 hours of injury.  Muscle spasms in the back. CAUSES  Low back sprains occur when a force is placed on the ligaments that is greater than they can handle. Common causes of injury include:  Performing a stressful act while off-balance.  Repetitive stressful activities that involve movement of the lower back.  Direct hit (trauma) to the lower back. RISK INCREASES WITH:  Contact sports (football, wrestling).  Collisions (major skiing accidents).  Sports that require throwing or lifting (baseball, weightlifting).  Sports  involving twisting of the spine (gymnastics, diving, tennis, golf).  Poor strength and flexibility.  Inadequate protection.  Previous back injury or surgery (especially fusion). PREVENTION  Wear properly fitted and padded protective equipment.  Warm up and stretch properly before activity.  Allow for adequate recovery between workouts.  Maintain physical fitness:  Strength, flexibility, and endurance.  Cardiovascular fitness.  Maintain a healthy body weight. PROGNOSIS  If treated properly, low back sprains usually heal with non-surgical treatment. The length of time for healing depends on the severity of the injury.  RELATED COMPLICATIONS   Recurring symptoms, resulting in a chronic problem.  Chronic inflammation and pain in the low back.  Delayed healing or resolution of symptoms, especially if activity is resumed too soon.  Prolonged impairment.  Unstable or arthritic joints of the low back. TREATMENT  Treatment first involves the use of ice and medicine, to reduce pain and inflammation. The use of strengthening and stretching exercises may help reduce pain with activity. These exercises may be performed at home or with a therapist. Severe injuries may require referral to a therapist for further evaluation and treatment, such as ultrasound. Your caregiver may advise that you wear a back brace or corset, to help reduce pain and discomfort. Often, prolonged bed rest results in greater harm then benefit. Corticosteroid injections may be recommended. However, these should be reserved for the most serious cases. It is important to avoid using your back when lifting objects. At night, sleep on your back on a firm mattress, with a pillow placed under your knees. If non-surgical treatment is unsuccessful, surgery may be needed.  MEDICATION   If pain medicine is needed, nonsteroidal anti-inflammatory medicines (aspirin and ibuprofen), or other minor pain relievers (acetaminophen), are  often advised.  Do not take pain medicine for 7 days before surgery.  Prescription pain relievers may be given, if your caregiver thinks they are needed. Use only as directed and only as much as you need.  Ointments applied to the skin may be helpful.  Corticosteroid injections may be given by your caregiver. These injections should be reserved for the most serious cases, because they may only be given a certain number of times. HEAT AND COLD  Cold treatment (icing) should be applied for 10 to 15 minutes every 2 to 3 hours for inflammation and pain, and immediately after activity that aggravates your symptoms. Use ice packs or an ice massage.  Heat treatment may be used before performing stretching and strengthening activities prescribed by your caregiver, physical therapist, or athletic trainer. Use a heat pack or a warm water soak. SEEK MEDICAL CARE IF:   Symptoms get worse or do not improve in 2 to 4 weeks, despite treatment.  You develop numbness or weakness in either leg.  You lose bowel or bladder function.  Any of the following occur after surgery: fever, increased pain, swelling, redness, drainage of fluids, or bleeding in the affected area.  New, unexplained symptoms develop. (Drugs used in treatment may produce side effects.) EXERCISES  RANGE OF MOTION (ROM) AND STRETCHING EXERCISES - Low Back Sprain Most people with lower back pain will find that their symptoms get worse with excessive bending forward (flexion) or arching at the lower back (extension). The exercises that will help resolve your symptoms will focus on the opposite motion.  Your physician, physical therapist or athletic trainer will help you determine which exercises will be most helpful to resolve your lower back pain. Do not complete any exercises without first consulting with your caregiver. Discontinue any exercises which make your symptoms worse, until you speak to your caregiver. If you have pain, numbness  or tingling which travels down into your buttocks, leg or foot, the goal of the therapy is for these symptoms to move closer to your back and eventually resolve. Sometimes, these leg symptoms will get better, but your lower back pain may worsen. This is often an indication of progress in your rehabilitation. Be very alert to any changes in your symptoms and the activities in which you participated in the 24 hours prior to the change. Sharing this information with your caregiver will allow him or her to most efficiently treat your condition. These exercises may help you when beginning to rehabilitate your injury. Your symptoms may resolve with or without further involvement from your physician, physical therapist or athletic trainer. While completing these exercises, remember:   Restoring tissue flexibility helps normal motion to return to the joints. This allows healthier, less painful movement and activity.  An effective stretch should be held for at least 30 seconds.  A stretch should never be painful. You should only feel a gentle lengthening or release in the stretched tissue. FLEXION RANGE OF MOTION AND STRETCHING EXERCISES: STRETCH - Flexion, Single Knee to Chest   Lie on a firm bed or floor with both legs extended in front of you.  Keeping one leg in contact with the floor, bring your opposite knee to your chest. Hold your leg in place by either grabbing behind your thigh or at your knee.  Pull until you feel a gentle stretch in your low back.  Hold __________ seconds.  Slowly release your grasp and repeat the exercise with the opposite side. Repeat __________ times. Complete this exercise __________ times per day.  STRETCH - Flexion, Double Knee to Chest  Lie on a firm bed or floor with both legs extended in front of you.  Keeping one leg in contact with the floor, bring your opposite knee to your chest.  Tense your stomach muscles to support your back and then lift your other knee to  your chest. Hold your legs in place by either grabbing behind your thighs or at your knees.  Pull both knees toward your chest until you feel a gentle stretch in your low back. Hold __________ seconds.  Tense your stomach muscles and slowly return one leg at a time to the floor. Repeat __________ times. Complete this exercise __________ times per day.  STRETCH - Low Trunk Rotation  Lie on a firm bed or floor. Keeping your legs in front of you, bend your knees so they are both pointed toward the ceiling and your feet are flat on the floor.  Extend your arms out to the side. This will stabilize your upper body by keeping your shoulders in contact with the floor.  Gently and slowly drop both knees together to one side until you feel a gentle stretch in your low back. Hold for __________ seconds.  Tense your stomach muscles to support your lower back as you bring your knees back to the starting position. Repeat the exercise to the other side. Repeat __________ times. Complete this exercise __________ times per day  EXTENSION RANGE OF MOTION AND FLEXIBILITY EXERCISES: STRETCH - Extension, Prone on Elbows   Lie on your stomach on the floor, a bed will be too soft. Place your palms about shoulder width apart and at the height of your head.  Place your elbows under your shoulders. If this is too painful, stack pillows under your chest.  Allow your body to relax so that your hips drop lower and make contact more completely with the floor.  Hold this position for __________ seconds.  Slowly return to lying flat on the floor. Repeat __________ times. Complete this exercise __________ times per day.  RANGE OF MOTION - Extension, Prone Press Ups  Lie on your stomach on the floor, a bed will be too soft. Place your palms about shoulder width apart and at the height of your head.  Keeping your back as relaxed as possible, slowly straighten your elbows while keeping your hips on the floor. You may  adjust the placement of your hands to maximize your comfort. As you gain motion, your hands will come more underneath your shoulders.  Hold this position __________ seconds.  Slowly return to lying flat on the floor. Repeat __________ times. Complete this exercise __________ times per day.  RANGE OF MOTION- Quadruped, Neutral Spine   Assume a hands and knees position on a firm surface. Keep your hands under your shoulders and your knees under your hips. You may place padding under your knees for comfort.  Drop your head and point your tailbone toward the ground below you. This will round out your lower back like an angry cat. Hold this position for __________ seconds.  Slowly lift your head and release your tail bone so that your back sags into a large arch, like an old horse.  Hold this position for __________ seconds.  Repeat this until you feel limber in your low back.  Now, find your "sweet spot."  This will be the most comfortable position somewhere between the two previous positions. This is your neutral spine. Once you have found this position, tense your stomach muscles to support your low back.  Hold this position for __________ seconds. Repeat __________ times. Complete this exercise __________ times per day.  STRENGTHENING EXERCISES - Low Back Sprain These exercises may help you when beginning to rehabilitate your injury. These exercises should be done near your "sweet spot." This is the neutral, low-back arch, somewhere between fully rounded and fully arched, that is your least painful position. When performed in this safe range of motion, these exercises can be used for people who have either a flexion or extension based injury. These exercises may resolve your symptoms with or without further involvement from your physician, physical therapist or athletic trainer. While completing these exercises, remember:   Muscles can gain both the endurance and the strength needed for  everyday activities through controlled exercises.  Complete these exercises as instructed by your physician, physical therapist or athletic trainer. Increase the resistance and repetitions only as guided.  You may experience muscle soreness or fatigue, but the pain or discomfort you are trying to eliminate should never worsen during these exercises. If this pain does worsen, stop and make certain you are following the directions exactly. If the pain is still present after adjustments, discontinue the exercise until you can discuss the trouble with your caregiver. STRENGTHENING - Deep Abdominals, Pelvic Tilt   Lie on a firm bed or floor. Keeping your legs in front of you, bend your knees so they are both pointed toward the ceiling and your feet are flat on the floor.  Tense your lower abdominal muscles to press your low back into the floor. This motion will rotate your pelvis so that your tail bone is scooping upwards rather than pointing at your feet or into the floor. With a gentle tension and even breathing, hold this position for __________ seconds. Repeat __________ times. Complete this exercise __________ times per day.  STRENGTHENING - Abdominals, Crunches   Lie on a firm bed or floor. Keeping your legs in front of you, bend your knees so they are both pointed toward the ceiling and your feet are flat on the floor. Cross your arms over your chest.  Slightly tip your chin down without bending your neck.  Tense your abdominals and slowly lift your trunk high enough to just clear your shoulder blades. Lifting higher can put excessive stress on the lower back and does not further strengthen your abdominal muscles.  Control your return to the starting position. Repeat __________ times. Complete this exercise __________ times per day.  STRENGTHENING - Quadruped, Opposite UE/LE Lift   Assume a hands and knees position on a firm surface. Keep your hands under your shoulders and your knees under  your hips. You may place padding under your knees for comfort.  Find your neutral spine and gently tense your abdominal muscles so that you can maintain this position. Your shoulders and hips should form a rectangle that is parallel with the floor and is not twisted.  Keeping your trunk steady, lift your right hand no higher than your shoulder and then your left leg no higher than your hip. Make sure you are not holding your breath. Hold this position for __________ seconds.  Continuing to keep your abdominal muscles tense and your back steady, slowly return to your starting position. Repeat with the opposite arm and leg. Repeat __________ times. Complete this  exercise __________ times per day.  STRENGTHENING - Abdominals and Quadriceps, Straight Leg Raise   Lie on a firm bed or floor with both legs extended in front of you.  Keeping one leg in contact with the floor, bend the other knee so that your foot can rest flat on the floor.  Find your neutral spine, and tense your abdominal muscles to maintain your spinal position throughout the exercise.  Slowly lift your straight leg off the floor about 6 inches for a count of 15, making sure to not hold your breath.  Still keeping your neutral spine, slowly lower your leg all the way to the floor. Repeat this exercise with each leg __________ times. Complete this exercise __________ times per day. POSTURE AND BODY MECHANICS CONSIDERATIONS - Low Back Sprain Keeping correct posture when sitting, standing or completing your activities will reduce the stress put on different body tissues, allowing injured tissues a chance to heal and limiting painful experiences. The following are general guidelines for improved posture. Your physician or physical therapist will provide you with any instructions specific to your needs. While reading these guidelines, remember:  The exercises prescribed by your provider will help you have the flexibility and strength to  maintain correct postures.  The correct posture provides the best environment for your joints to work. All of your joints have less wear and tear when properly supported by a spine with good posture. This means you will experience a healthier, less painful body.  Correct posture must be practiced with all of your activities, especially prolonged sitting and standing. Correct posture is as important when doing repetitive low-stress activities (typing) as it is when doing a single heavy-load activity (lifting). RESTING POSITIONS Consider which positions are most painful for you when choosing a resting position. If you have pain with flexion-based activities (sitting, bending, stooping, squatting), choose a position that allows you to rest in a less flexed posture. You would want to avoid curling into a fetal position on your side. If your pain worsens with extension-based activities (prolonged standing, working overhead), avoid resting in an extended position such as sleeping on your stomach. Most people will find more comfort when they rest with their spine in a more neutral position, neither too rounded nor too arched. Lying on a non-sagging bed on your side with a pillow between your knees, or on your back with a pillow under your knees will often provide some relief. Keep in mind, being in any one position for a prolonged period of time, no matter how correct your posture, can still lead to stiffness. PROPER SITTING POSTURE In order to minimize stress and discomfort on your spine, you must sit with correct posture. Sitting with good posture should be effortless for a healthy body. Returning to good posture is a gradual process. Many people can work toward this most comfortably by using various supports until they have the flexibility and strength to maintain this posture on their own. When sitting with proper posture, your ears will fall over your shoulders and your shoulders will fall over your hips. You  should use the back of the chair to support your upper back. Your lower back will be in a neutral position, just slightly arched. You may place a small pillow or folded towel at the base of your lower back for  support.  When working at a desk, create an environment that supports good, upright posture. Without extra support, muscles tire, which leads to excessive strain on joints and  other tissues. Keep these recommendations in mind: CHAIR:  A chair should be able to slide under your desk when your back makes contact with the back of the chair. This allows you to work closely.  The chair's height should allow your eyes to be level with the upper part of your monitor and your hands to be slightly lower than your elbows. BODY POSITION  Your feet should make contact with the floor. If this is not possible, use a foot rest.  Keep your ears over your shoulders. This will reduce stress on your neck and low back. INCORRECT SITTING POSTURES  If you are feeling tired and unable to assume a healthy sitting posture, do not slouch or slump. This puts excessive strain on your back tissues, causing more damage and pain. Healthier options include:  Using more support, like a lumbar pillow.  Switching tasks to something that requires you to be upright or walking.  Talking a brief walk.  Lying down to rest in a neutral-spine position. PROLONGED STANDING WHILE SLIGHTLY LEANING FORWARD  When completing a task that requires you to lean forward while standing in one place for a long time, place either foot up on a stationary 2-4 inch high object to help maintain the best posture. When both feet are on the ground, the lower back tends to lose its slight inward curve. If this curve flattens (or becomes too large), then the back and your other joints will experience too much stress, tire more quickly, and can cause pain. CORRECT STANDING POSTURES Proper standing posture should be assumed with all daily activities,  even if they only take a few moments, like when brushing your teeth. As in sitting, your ears should fall over your shoulders and your shoulders should fall over your hips. You should keep a slight tension in your abdominal muscles to brace your spine. Your tailbone should point down to the ground, not behind your body, resulting in an over-extended swayback posture.  INCORRECT STANDING POSTURES  Common incorrect standing postures include a forward head, locked knees and/or an excessive swayback. WALKING Walk with an upright posture. Your ears, shoulders and hips should all line-up. PROLONGED ACTIVITY IN A FLEXED POSITION When completing a task that requires you to bend forward at your waist or lean over a low surface, try to find a way to stabilize 3 out of 4 of your limbs. You can place a hand or elbow on your thigh or rest a knee on the surface you are reaching across. This will provide you more stability, so that your muscles do not tire as quickly. By keeping your knees relaxed, or slightly bent, you will also reduce stress across your lower back. CORRECT LIFTING TECHNIQUES DO :  Assume a wide stance. This will provide you more stability and the opportunity to get as close as possible to the object which you are lifting.  Tense your abdominals to brace your spine. Bend at the knees and hips. Keeping your back locked in a neutral-spine position, lift using your leg muscles. Lift with your legs, keeping your back straight.  Test the weight of unknown objects before attempting to lift them.  Try to keep your elbows locked down at your sides in order get the best strength from your shoulders when carrying an object.  Always ask for help when lifting heavy or awkward objects. INCORRECT LIFTING TECHNIQUES DO NOT:   Lock your knees when lifting, even if it is a small object.  Bend and twist.  Pivot at your feet or move your feet when needing to change directions.  Assume that you can safely  pick up even a paperclip without proper posture.   This information is not intended to replace advice given to you by your health care provider. Make sure you discuss any questions you have with your health care provider.   Document Released: 01/11/2005 Document Revised: 02/01/2014 Document Reviewed: 04/25/2008 [employer] Interactive Patient Education [employer].

## 2015-02-12 NOTE — Progress Notes (Signed)
Patient ID: Martin Bush, male   DOB: [birth year], 60 y.o.   MRN: [id number]  Tommi Rumps, MD Phone: [phone]  Martin Bush is a 60 y.o. male who presents today for follow-up.  Low back pain: Patient notes many years ago he fell and hurt his back when working and was subsequently found to have 3 pinched nerves in his lumbar spine. Notes intermittently he will have an issue if he twists incorrectly. He notes he gets pain in the right side of his low back that radiates down the back of his leg. No numbness or weakness with this. Denies bowel and bladder incontinence, saddle anesthesia, fevers, and history of cancer. He has [facility]. Notes it is slowly getting better over the last several days. He is using heat and ice.   Notes he is significantly better from his visit to the ED. States he was dehydrated and has remained hydrated since discharge. He does not have any lightheadedness since that time.  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 He has not had any vision changes in the last several months. He has follow-up with them at the end of this month. Last A1c was 8.6 per his report. Medications: Compliance- taking Humulin 14 units in the morning, 14 units in the middle today, 12 units at night. Hypoglycemic symptoms- occur about 2 times a month. Notes he gets sweaty and weak. Notes he has glucose tablets at home. Takes these or eats a carbohydrate and this improves. Typically his blood sugars are in the 50-60 range when this occurs.  PMH: nonsmoker.   ROS see history of present illness  Objective  Physical Exam Filed Vitals:   02/12/15 1056  BP: 116/82  Pulse: 86  Temp: 97.8 F (36.6 C)    BP Readings from Last 3 Encounters:  02/12/15 116/82  02/05/15 135/83  02/05/15 124/76   Wt Readings from Last 3 Encounters:    02/12/15 246 lb 6.4 oz (111.766 kg)  02/05/15 244 lb (110.678 kg)  02/05/15 244 lb (110.678 kg)    Physical Exam  Constitutional: He is well-developed, well-nourished, and in no distress.  HENT:  Head: Normocephalic and atraumatic.  Right Ear: External ear normal.  Left Ear: External ear normal.  Mouth/Throat: Oropharynx is clear and moist. No oropharyngeal exudate.  Eyes: Conjunctivae are normal. Pupils are equal, round, and reactive to light.  Neck: Neck supple.  Cardiovascular: Normal rate, regular rhythm and normal heart sounds.  Exam reveals no gallop and no friction rub.   No murmur heard. Pulmonary/Chest: Effort normal and breath sounds normal. No respiratory distress. He has no wheezes. He has no rales.  Musculoskeletal:  No midline spine tenderness or step-off, no erythema of the back, no muscular back tenderness, negative straight leg raise  Lymphadenopathy:    He has no cervical adenopathy.  Neurological: He is alert.  5 out of 5 strength in bilateral quads, hamstrings, plantar flexion, dorsiflexion, sensation light touch intact bilaterally lower extremities, 2+ patellar reflexes  Skin: Skin is warm and dry. He is not diaphoretic.     Assessment/Plan: Please see individual problem list.  Dizziness Resolved. Likely related to dehydration. Patient will continue to stay hydrated. Will have patient return for follow-up BMP to check renal function. Given return precautions  Chronic low back pain Patient with chronic low back pain with likely  acute exacerbation. Has been improving. Has Flexeril to use at home. Discussed with his cardiac history NSAIDs are not a good option. Given that he is on SSRI tramadol is not a good option. Advised Tylenol and Flexeril. No red flags. Neurologically intact in lower extremities. We will continue to monitor. Given return precautions.  Diabetes ([place]) Blood sugar range slightly elevated at times. Last A1c reportedly checked just before he  switched to my care. We'll continue current regimen. Reports he is not taking metformin. Advised on hypoglycemic protocol. If hypoglycemic events become more frequent, consider decreasing nighttime insulin.    Orders Placed This Encounter  Procedures  . Basic Metabolic Panel (BMET)    Standing Status: Future     Number of Occurrences:      Standing Expiration Date: 02/13/2016     Tommi Rumps

## 2015-02-13 ENCOUNTER — Telehealth: Payer: Self-pay | Admitting: Surgical

## 2015-02-13 DIAGNOSIS — E1169 Type 2 diabetes mellitus with other specified complication: Secondary | ICD-10-CM | POA: Insufficient documentation

## 2015-02-13 DIAGNOSIS — E1122 Type 2 diabetes mellitus with diabetic chronic kidney disease: Secondary | ICD-10-CM | POA: Insufficient documentation

## 2015-02-13 DIAGNOSIS — M545 Low back pain, unspecified: Secondary | ICD-10-CM | POA: Insufficient documentation

## 2015-02-13 DIAGNOSIS — Z794 Long term (current) use of insulin: Secondary | ICD-10-CM | POA: Insufficient documentation

## 2015-02-13 DIAGNOSIS — G8929 Other chronic pain: Secondary | ICD-10-CM | POA: Insufficient documentation

## 2015-02-13 DIAGNOSIS — E119 Type 2 diabetes mellitus without complications: Secondary | ICD-10-CM | POA: Insufficient documentation

## 2015-02-13 NOTE — Telephone Encounter (Signed)
Spoke with patient and he will come Friday to recheck labs. He also is not on the Metformin so I will remove from Med list.

## 2015-02-13 NOTE — Assessment & Plan Note (Signed)
Patient with chronic low back pain with likely acute exacerbation. Has been improving. Has Flexeril to use at home. Discussed with his cardiac history NSAIDs are not a good option. Given that he is on SSRI tramadol is not a good option. Advised Tylenol and Flexeril. No red flags. Neurologically intact in lower extremities. We will continue to monitor. Given return precautions.

## 2015-02-13 NOTE — Assessment & Plan Note (Signed)
Resolved. Likely related to dehydration. Patient will continue to stay hydrated. Will have patient return for follow-up BMP to check renal function. Given return precautions

## 2015-02-13 NOTE — Assessment & Plan Note (Addendum)
Blood sugar range slightly elevated at times. Last A1c reportedly checked just before he switched to my care. We'll continue current regimen. Reports he is not taking metformin. Advised on hypoglycemic protocol. If hypoglycemic events become more frequent, consider decreasing nighttime insulin.

## 2015-02-13 NOTE — Addendum Note (Signed)
Addended by: Durwin Glaze on: 02/13/2015 01:17 PM   Modules accepted: Orders, Medications

## 2015-02-13 NOTE — Telephone Encounter (Signed)
Noted. Thanks.

## 2015-02-14 ENCOUNTER — Other Ambulatory Visit (INDEPENDENT_AMBULATORY_CARE_PROVIDER_SITE_OTHER): Payer: Medicare Other

## 2015-02-14 ENCOUNTER — Telehealth: Payer: Self-pay

## 2015-02-14 ENCOUNTER — Other Ambulatory Visit: Payer: Self-pay | Admitting: Family Medicine

## 2015-02-14 DIAGNOSIS — R42 Dizziness and giddiness: Secondary | ICD-10-CM | POA: Diagnosis not present

## 2015-02-14 DIAGNOSIS — Z794 Long term (current) use of insulin: Secondary | ICD-10-CM

## 2015-02-14 DIAGNOSIS — E118 Type 2 diabetes mellitus with unspecified complications: Secondary | ICD-10-CM

## 2015-02-14 LAB — BASIC METABOLIC PANEL
BUN: 28 mg/dL — ABNORMAL HIGH (ref 6–23)
CO2: 27 mEq/L (ref 19–32)
Calcium: 9.5 mg/dL (ref 8.4–10.5)
Chloride: 100 mEq/L (ref 96–112)
Creatinine, Ser: 1.51 mg/dL — ABNORMAL HIGH (ref 0.40–1.50)
GFR: 50.5 mL/min — ABNORMAL LOW (ref >60.00)
Glucose, Bld: 191 mg/dL — ABNORMAL HIGH (ref 70–99)
Potassium: 4.7 mEq/L (ref 3.5–5.1)
Sodium: 136 mEq/L (ref 135–145)

## 2015-02-14 NOTE — Telephone Encounter (Signed)
Given his history of cardiac arrest and heart issues lidoderm patches are not a good option for the patient. Please find out what muscle relaxer he is on and what dose. They were supposed to call back with that information. Thanks.

## 2015-02-14 NOTE — Telephone Encounter (Signed)
Patient came in for labs.  He wanted to know if you would order lidoderm patches for his low back pain.  At his visit I see you discussed treatment options, put him on a muscle relaxer and he states it is not working.  He uses the [facility].  Please advise.

## 2015-02-14 NOTE — Telephone Encounter (Signed)
Patient is taking Robaxin 500 MG daily.

## 2015-02-14 NOTE — Telephone Encounter (Signed)
Advised that the patient could take 1000 mg robaxin TID prn. CMA advised patient of this while he was on the phone.

## 2015-02-24 ENCOUNTER — Other Ambulatory Visit: Payer: Self-pay | Admitting: Family Medicine

## 2015-02-24 DIAGNOSIS — N179 Acute kidney failure, unspecified: Secondary | ICD-10-CM

## 2015-02-25 ENCOUNTER — Other Ambulatory Visit: Payer: Self-pay | Admitting: Family Medicine

## 2015-02-28 ENCOUNTER — Ambulatory Visit (INDEPENDENT_AMBULATORY_CARE_PROVIDER_SITE_OTHER): Payer: Medicare Other | Admitting: Family Medicine

## 2015-02-28 ENCOUNTER — Encounter: Payer: Self-pay | Admitting: Family Medicine

## 2015-02-28 VITALS — BP 128/82 | HR 82 | Temp 98.0°F | Ht 67.5 in | Wt 244.8 lb

## 2015-02-28 DIAGNOSIS — Z0000 Encounter for general adult medical examination without abnormal findings: Secondary | ICD-10-CM | POA: Diagnosis not present

## 2015-02-28 DIAGNOSIS — Z114 Encounter for screening for human immunodeficiency virus [HIV]: Secondary | ICD-10-CM | POA: Diagnosis not present

## 2015-02-28 DIAGNOSIS — N179 Acute kidney failure, unspecified: Secondary | ICD-10-CM

## 2015-02-28 DIAGNOSIS — Z1159 Encounter for screening for other viral diseases: Secondary | ICD-10-CM

## 2015-02-28 DIAGNOSIS — Z1211 Encounter for screening for malignant neoplasm of colon: Secondary | ICD-10-CM

## 2015-02-28 LAB — BASIC METABOLIC PANEL
BUN: 30 mg/dL — ABNORMAL HIGH (ref 6–23)
CO2: 26 mEq/L (ref 19–32)
Calcium: 9.2 mg/dL (ref 8.4–10.5)
Chloride: 99 mEq/L (ref 96–112)
Creatinine, Ser: 1.4 mg/dL (ref 0.40–1.50)
GFR: 55.1 mL/min — ABNORMAL LOW (ref >60.00)
Glucose, Bld: 278 mg/dL — ABNORMAL HIGH (ref 70–99)
Potassium: 4.4 mEq/L (ref 3.5–5.1)
Sodium: 133 mEq/L — ABNORMAL LOW (ref 135–145)

## 2015-02-28 NOTE — Assessment & Plan Note (Addendum)
Overall he is doing quite well considering his cardiac events in the last year. Discussed diet and exercise at length. We'll refer to GI at the [facility] for consideration of repeat colonoscopy. We'll check HIV and hepatitis C today. Recheck BMP given previously elevated creatinine. Discussed monitoring fluid status and blood glucoses as well. We will request labs regarding cholesterol and PSA from the [place]. Given return precautions.

## 2015-02-28 NOTE — Patient Instructions (Signed)
Nice to see you. Please continue to work on increasing her exercise. Also work on diet as outlined below. We will refer you for colonoscopy. Please monitor your fluid status and if you again 3 pounds or more in one day or 5 pounds or more in one week please let us know. If you develop chest pain, shortness of breath, swelling, or any new or change in symptoms please seek medical attention.

## 2015-02-28 NOTE — Progress Notes (Signed)
Patient ID: Martin Bush, male   DOB: [birth year], 60 y.o.   MRN: [id number]  Martin Rumps, MD Phone: [phone]  Martin Bush is a 60 y.o. male who presents today for physical exam.  Patient notes overall he is doing quite well at this time. He started going to the gym one time a week and riding the bike. He is slowly getting back to exercising as he has been cleared by his cardiologist for this. Diet typically consists of 2 meals a day mostly chicken and vegetables. They're not eating out much. No added salt. No fried foods. Colonoscopy about 7 years ago with several polyps removed. Tdap 03/11/14. Flu shot 11/31/16.  Never been tested for HIV or hepatitis C. Does not drink alcohol. No current tobacco use. Smokes cigars infrequently for about 20 years. Did dip daily until just prior to his heart attack last year. His stopped using tobacco products since then.  Reports weight has been stable. No orthopnea. No chest pain. No shortness of breath. No edema. Blood sugars range from 76-191 and the last several days.  He does report difficulty obtaining an erection. He does note going to the gym has helped with this. Is not taking any medication for this.   Active Ambulatory Problems    Diagnosis Date Noted  . Chest pain 07/16/2014  . NSTEMI (non-ST elevated myocardial infarction) ([place]) 07/16/2014  . Dizziness 02/05/2015  . Chronic low back pain 02/13/2015  . Diabetes ([place]) 02/13/2015  . Annual physical exam 02/28/2015   Resolved Ambulatory Problems    Diagnosis Date Noted  . No Resolved Ambulatory Problems   Past Medical History  Diagnosis Date  . Coronary artery disease   . MI (myocardial infarction) ([place])   . High cholesterol   . Hypertension   . Proteinuria   . Sleep apnea   . Diabetes mellitus without complication ([place])   . Arthritis   . Depression   . Colon polyps     Family History  Problem Relation Age of Onset  . Uterine cancer Mother   . Lung cancer Mother     . Hyperlipidemia Father   . Heart disease Father   . Hypertension Father   . Diabetes Father     Social History   Social History  . Marital Status: Married    Spouse Name: N/A  . Number of Children: N/A  . Years of Education: N/A   Occupational History  . Not on file.   Social History Main Topics  . Smoking status: Never Smoker   . Smokeless tobacco: Former Systems developer  . Alcohol Use: No  . Drug Use: No  . Sexual Activity: Not on file   Other Topics Concern  . Not on file   Social History Narrative    ROS   General:  Negative for unexplained weight loss, fever Skin: Negative for new or changing mole, sore that won't heal HEENT: Negative for trouble hearing, trouble seeing, ringing in ears, mouth sores, hoarseness, change in voice, dysphagia. CV:  Negative for chest pain, dyspnea, edema, palpitations Resp: Negative for cough, dyspnea, hemoptysis GI: Negative for nausea, vomiting, diarrhea, constipation, abdominal pain, melena, hematochezia. GU: Positive for sexual difficulty, Negative for dysuria, incontinence, urinary hesitance, hematuria, vaginal or penile discharge, polyuria, lumps in testicle or breasts MSK: Negative for muscle cramps or aches, joint pain or swelling Neuro: Negative for headaches, weakness, numbness, dizziness, passing out/fainting Psych: Negative for depression, anxiety, memory problems   Objective  Physical Exam Filed Vitals:  02/28/15 1322  BP: 128/82  Pulse: 82  Temp: 98 F (36.7 C)    BP Readings from Last 3 Encounters:  02/28/15 128/82  02/12/15 116/82  02/05/15 135/83   Wt Readings from Last 3 Encounters:  02/28/15 244 lb 12.8 oz (111.041 kg)  02/12/15 246 lb 6.4 oz (111.766 kg)  02/05/15 244 lb (110.678 kg)    Physical Exam  Constitutional: He is well-developed, well-nourished, and in no distress.  HENT:  Head: Normocephalic and atraumatic.  Right Ear: External ear normal.  Left Ear: External ear normal.  Mouth/Throat:  Oropharynx is clear and moist. No oropharyngeal exudate.  Eyes: Conjunctivae are normal. Pupils are equal, round, and reactive to light.  Neck: Neck supple.  Cardiovascular: Normal rate, regular rhythm and normal heart sounds.  Exam reveals no gallop and no friction rub.   No murmur heard. Pulmonary/Chest: Effort normal and breath sounds normal. No respiratory distress. He has no wheezes. He has no rales.  Abdominal: Soft. Bowel sounds are normal. He exhibits no distension. There is no tenderness. There is no rebound and no guarding.  Genitourinary:  Normal penis, normal testicles, normal epididymis, normal vas deferens, no inguinal hernias  Musculoskeletal: He exhibits no edema.  Lymphadenopathy:    He has no cervical adenopathy.  Neurological: He is alert. Gait normal.  Skin: Skin is warm and dry. He is not diaphoretic.  Psychiatric: Mood and affect normal.     Assessment/Plan:   Annual physical exam Overall he is doing quite well considering his cardiac events in the last year. Discussed diet and exercise at length. We'll refer to GI at the [facility] for consideration of repeat colonoscopy. We'll check HIV and hepatitis C today. Recheck BMP given previously elevated creatinine. Discussed monitoring fluid status and blood glucoses as well. We will request labs regarding cholesterol and PSA from the [place]. Given return precautions.   advised to be careful with his erectile dysfunction that if he does have sex he should be wary of symptoms given his recent cardiac history.  Orders Placed This Encounter  Procedures  . HIV antibody (with reflex)  . Hepatitis C Antibody  . Ambulatory referral to Gastroenterology    Referral Priority:  Routine    Referral Type:  Consultation    Referral Reason:  Specialty Services Required    Number of Visits Requested:  1    Martin Bush

## 2015-02-28 NOTE — Progress Notes (Signed)
Pre visit review using our clinic review tool, if applicable. No additional management support is needed unless otherwise documented below in the visit note. 

## 2015-03-01 LAB — HIV ANTIBODY (ROUTINE TESTING W REFLEX): HIV 1&2 Ab, 4th Generation: NONREACTIVE

## 2015-03-01 LAB — HEPATITIS C ANTIBODY: HCV Ab: NEGATIVE

## 2015-03-03 ENCOUNTER — Other Ambulatory Visit: Payer: Medicare Other

## 2015-03-18 ENCOUNTER — Ambulatory Visit: Payer: Medicare Other | Admitting: Family Medicine

## 2015-03-24 ENCOUNTER — Telehealth: Payer: Self-pay | Admitting: Family Medicine

## 2015-03-24 NOTE — Telephone Encounter (Signed)
Amanda [phone] call from [place] [facility] from the GI dept she states they need another copy of the office visit records from the last visit due to provider missed placed them at the [place] . Want to check if he's due coloscopy and a upper endoscopy. Provider name is Lilia Retnikova. Fax number [phone]. Thank You!

## 2015-03-25 NOTE — Telephone Encounter (Signed)
Records faxed.

## 2015-04-24 ENCOUNTER — Encounter: Payer: Self-pay | Admitting: Family Medicine

## 2015-04-24 ENCOUNTER — Ambulatory Visit (INDEPENDENT_AMBULATORY_CARE_PROVIDER_SITE_OTHER): Payer: Medicare Other | Admitting: Family Medicine

## 2015-04-24 VITALS — BP 104/60 | HR 86 | Temp 98.1°F | Ht 67.5 in | Wt 251.8 lb

## 2015-04-24 DIAGNOSIS — S99922A Unspecified injury of left foot, initial encounter: Secondary | ICD-10-CM

## 2015-04-24 LAB — CBC
HCT: 39.6 % (ref 39.0–52.0)
Hemoglobin: 13.1 g/dL (ref 13.0–17.0)
MCHC: 33.2 g/dL (ref 30.0–36.0)
MCV: 90.1 fl (ref 78.0–100.0)
Platelets: 220 10*3/uL (ref 150.0–400.0)
RBC: 4.39 Mil/uL (ref 4.22–5.81)
RDW: 14 % (ref 11.5–15.5)
WBC: 9.8 10*3/uL (ref 4.0–10.5)

## 2015-04-24 LAB — SEDIMENTATION RATE: Sed Rate: 19 mm/hr (ref 0–22)

## 2015-04-24 MED ORDER — DOXYCYCLINE HYCLATE 100 MG PO TABS: 100.0000 mg | ORAL_TABLET | Freq: Two times a day (BID) | ORAL | Status: DC

## 2015-04-24 MED ORDER — CEPHALEXIN 500 MG PO CAPS: 500.0000 mg | ORAL_CAPSULE | Freq: Four times a day (QID) | ORAL | Status: DC

## 2015-04-24 NOTE — Patient Instructions (Signed)
Nice to see you. Injury to her toe is likely the cause of the redness, though given that there is redness and your history of diabetes we will cover for infection. We will obtain lab work to evaluate for inflammation and infection. We will attain x-rays to evaluate this further. If you develop fever, worsening pain, numbness, weakness, spreading redness, or any new or changing symptoms please seek medical attention.

## 2015-04-24 NOTE — Progress Notes (Signed)
Pre visit review using our clinic review tool, if applicable. No additional management support is needed unless otherwise documented below in the visit note. 

## 2015-04-25 ENCOUNTER — Ambulatory Visit (INDEPENDENT_AMBULATORY_CARE_PROVIDER_SITE_OTHER)
Admission: RE | Admit: 2015-04-25 | Discharge: 2015-04-25 | Disposition: A | Discharge status: Home / Self Care | Payer: Medicare Other | Source: Ambulatory Visit | Attending: Family Medicine | Admitting: Family Medicine

## 2015-04-25 DIAGNOSIS — M7989 Other specified soft tissue disorders: Secondary | ICD-10-CM | POA: Diagnosis not present

## 2015-04-25 DIAGNOSIS — S99922A Unspecified injury of left foot, initial encounter: Secondary | ICD-10-CM | POA: Diagnosis not present

## 2015-04-27 DIAGNOSIS — S99922A Unspecified injury of left foot, initial encounter: Secondary | ICD-10-CM | POA: Insufficient documentation

## 2015-04-27 NOTE — Progress Notes (Signed)
Patient ID: Martin Bush, male   DOB: 11/08/1955, 60 y.o.   MRN: [id number]   Martin Rumps, Martin Bush Phone: [phone]  Martin Bush is a 60 y.o. male who presents today for same-day visit.  Patient notes he stubbed his left great toe coming up the steps on Saturday. Notes it bled a little bit initially. Initially turned black and blue. Notes it looks better now than it did. There has been some redness around the base of the toenail that started 2 nights ago. He has not had any fevers. There is no warmth of the area or drainage from the area. He notes his toe overall does look better than it did. There is pain in this area. He has a history of diabetes.  PMH: nonsmoker.   ROS see history of present illness  Objective  Physical Exam Filed Vitals:   04/24/15 1329  BP: 104/60  Pulse: 86  Temp: 98.1 F (36.7 C)    BP Readings from Last 3 Encounters:  04/24/15 104/60  02/28/15 128/82  02/12/15 116/82   Wt Readings from Last 3 Encounters:  04/24/15 251 lb 12.8 oz (114.216 kg)  02/28/15 244 lb 12.8 oz (111.041 kg)  02/12/15 246 lb 6.4 oz (111.766 kg)    Physical Exam  Constitutional: He is well-developed, well-nourished, and in no distress.  HENT:  Head: Normocephalic and atraumatic.  Cardiovascular: Normal rate, regular rhythm and normal heart sounds.  Exam reveals no gallop and no friction rub.   No murmur heard. Pulmonary/Chest: Effort normal and breath sounds normal. No respiratory distress. He has no wheezes. He has no rales.  Musculoskeletal:  Left great toe with minimal swelling, toenail appears to be partially detached from the nailbed though not attached at its base, there is some mild erythema at the base of the nail that is somewhat tender though not warm or indurated or fluctuant, there is no drainage from this area, he has capillary refill less than 2 seconds, sensation to light touch in the toe is intact, there is full range of motion of the toe Bilateral feet are  otherwise without lesions and have intact sensation to light touch  Neurological: He is alert.  5 out of 5 strength in bilateral plantar flexion and dorsiflexion, sensation to light touch intact bilateral lower extremities  Skin: Skin is dry. He is not diaphoretic.     Assessment/Plan: Please see individual problem list.  Injury of left toe Suspect patient's current symptoms are likely related to the trauma that he incurred on his left great toe on Saturday. There is erythema around the base of the toenail, though no other signs of infection. He is neurovascularly intact in his bilateral lower extremities. Discussed possible evaluation and management strategies. Patient opted for x-ray of left foot and left great toe. Discussed potentially covering with antibiotics given that he is diabetic and does have erythema. Patient was agreeable to this and we will start him on Keflex and doxycycline to provide broad range of coverage. We will additionally order lab work to evaluate for infection and inflammatory response. Patient will continue to monitor and follow up next week for reevaluation. Given return precautions.    Orders Placed This Encounter  Procedures  . DG Foot Complete Left    Standing Status: Future     Number of Occurrences: 1     Standing Expiration Date: 06/23/2016    Order Specific Question:  Reason for Exam (SYMPTOM  OR DIAGNOSIS REQUIRED)    Answer:  left big  toe injury with swelling and redness    Order Specific Question:  Preferred imaging location?    Answer:  [facility]  . DG Toe Great Left    Standing Status: Future     Number of Occurrences: 1     Standing Expiration Date: 06/23/2016    Order Specific Question:  Reason for Exam (SYMPTOM  OR DIAGNOSIS REQUIRED)    Answer:  left big toe injury with swelling and redness    Order Specific Question:  Preferred imaging location?    Answer:  [facility]  . Sed Rate (ESR)  . CBC    Meds ordered this encounter   Medications  . cephALEXin (KEFLEX) 500 MG capsule    Sig: Take 1 capsule (500 mg total) by mouth 4 (four) times daily.    Dispense:  28 capsule    Refill:  0  . doxycycline (VIBRA-TABS) 100 MG tablet    Sig: Take 1 tablet (100 mg total) by mouth 2 (two) times daily.    Dispense:  14 tablet    Refill:  0    Martin Rumps, Martin Bush [place]

## 2015-04-27 NOTE — Assessment & Plan Note (Signed)
Suspect patient's current symptoms are likely related to the trauma that he incurred on his left great toe on Saturday. There is erythema around the base of the toenail, though no other signs of infection. He is neurovascularly intact in his bilateral lower extremities. Discussed possible evaluation and management strategies. Patient opted for x-ray of left foot and left great toe. Discussed potentially covering with antibiotics given that he is diabetic and does have erythema. Patient was agreeable to this and we will start him on Keflex and doxycycline to provide broad range of coverage. We will additionally order lab work to evaluate for infection and inflammatory response. Patient will continue to monitor and follow up next week for reevaluation. Given return precautions.

## 2015-04-28 ENCOUNTER — Encounter: Payer: Self-pay | Admitting: Family Medicine

## 2015-04-28 ENCOUNTER — Ambulatory Visit (INDEPENDENT_AMBULATORY_CARE_PROVIDER_SITE_OTHER): Payer: Medicare Other | Admitting: Family Medicine

## 2015-04-28 VITALS — BP 120/86 | HR 86 | Temp 97.9°F | Ht 67.5 in | Wt 252.6 lb

## 2015-04-28 DIAGNOSIS — S99922A Unspecified injury of left foot, initial encounter: Secondary | ICD-10-CM | POA: Diagnosis not present

## 2015-04-28 DIAGNOSIS — E119 Type 2 diabetes mellitus without complications: Secondary | ICD-10-CM

## 2015-04-28 DIAGNOSIS — Z794 Long term (current) use of insulin: Secondary | ICD-10-CM

## 2015-04-28 NOTE — Progress Notes (Signed)
Pre visit review using our clinic review tool, if applicable. No additional management support is needed unless otherwise documented below in the visit note. 

## 2015-04-28 NOTE — Progress Notes (Signed)
Patient ID: Martin Bush, male   DOB: 07-Jul-1955, 60 y.o.   MRN: [id number]  Tommi Rumps, MD Phone: [phone]  Martin Bush is a 60 y.o. male who presents today for follow-up.  Patient reports his left toe feel significant better. The redness is much improved. No drainage. No bleeding. No fevers. No numbness or weakness. He's been taking the doxycycline and Keflex. His lab work was unremarkable for infection and inflammation making osteomyelitis unlikely. X-rays were negative as well for fracture.  DIABETES Disease Monitoring: Blood Sugar ranges-high 100s Polyuria/phagia/dipsia- no     Medications: Compliance- taking Humulin R 14 units in the morning 14 units in the middle of the day and 12 units at night Hypoglycemic symptoms- no Reports his A1c was 7.2 about a week ago checked through his insurance company.  PMH: nonsmoker.   ROS see history of present illness  Objective  Physical Exam Filed Vitals:   04/28/15 0955  BP: 120/86  Pulse: 86  Temp: 97.9 F (36.6 C)    BP Readings from Last 3 Encounters:  04/28/15 120/86  04/24/15 104/60  02/28/15 128/82   Wt Readings from Last 3 Encounters:  04/28/15 252 lb 9.6 oz (114.579 kg)  04/24/15 251 lb 12.8 oz (114.216 kg)  02/28/15 244 lb 12.8 oz (111.041 kg)    Physical Exam  Constitutional: He is well-developed, well-nourished, and in no distress.  HENT:  Head: Normocephalic and atraumatic.  Pulmonary/Chest: Effort normal.  Musculoskeletal:  Left toe with no swelling, erythema at the base of the toenail is much improved, there is no induration, there is mild tenderness of the base of the toenail, there is no fluctuance, capillary refill less than 2 seconds  Neurological: He is alert.  5 out of 5 strength left foot plantar flexion and dorsiflexion and great toe flexion and extension, sensation to light touch intact in left lower extremity  Skin: Skin is warm and dry. He is not diaphoretic.     Assessment/Plan:  Please see individual problem list.  Injury of left toe Much improved. Suspect the erythema was likely related to the trauma and stubbing his toe, though with improvement on antibiotics would finish the course of antibiotics. Neurovascularly intact in his left foot. prior workup negative for fracture and signs of bone infection. Patient will continue to monitor and complete the course of antibiotics. Given return precautions.  Diabetes ([place]) A1c per patient report much improved. We'll request records. No hypoglycemia. Given steadily improving A1c will continue current insulin regimen. Patient will continue to check blood sugars. Discussed that if his A1c plateaus would consider increasing his insulin. He is going to work on diet and exercise as well.    Tommi Rumps, MD [place]

## 2015-04-28 NOTE — Assessment & Plan Note (Signed)
Much improved. Suspect the erythema was likely related to the trauma and stubbing his toe, though with improvement on antibiotics would finish the course of antibiotics. Neurovascularly intact in his left foot. prior workup negative for fracture and signs of bone infection. Patient will continue to monitor and complete the course of antibiotics. Given return precautions.

## 2015-04-28 NOTE — Patient Instructions (Signed)
Nice to see you. Please continue to monitor your toe. If it becomes more red or starts to drain anything please let us know. We are in a request your recent A1c. Please continue your current insulin regimen as you have been. If you develop fevers, worsening pain in her toe, spreading redness, or any new or changing symptoms please seek medical attention.

## 2015-04-28 NOTE — Assessment & Plan Note (Signed)
A1c per patient report much improved. We'll request records. No hypoglycemia. Given steadily improving A1c will continue current insulin regimen. Patient will continue to check blood sugars. Discussed that if his A1c plateaus would consider increasing his insulin. He is going to work on diet and exercise as well.

## 2015-04-30 DIAGNOSIS — I472 Ventricular tachycardia: Secondary | ICD-10-CM | POA: Diagnosis not present

## 2015-05-01 DIAGNOSIS — I472 Ventricular tachycardia: Secondary | ICD-10-CM | POA: Diagnosis not present

## 2015-05-08 DIAGNOSIS — I2510 Atherosclerotic heart disease of native coronary artery without angina pectoris: Secondary | ICD-10-CM | POA: Diagnosis not present

## 2015-05-08 DIAGNOSIS — I255 Ischemic cardiomyopathy: Secondary | ICD-10-CM | POA: Diagnosis not present

## 2015-05-08 DIAGNOSIS — Z95810 Presence of automatic (implantable) cardiac defibrillator: Secondary | ICD-10-CM | POA: Diagnosis not present

## 2015-05-08 DIAGNOSIS — I25810 Atherosclerosis of coronary artery bypass graft(s) without angina pectoris: Secondary | ICD-10-CM | POA: Diagnosis not present

## 2015-05-15 ENCOUNTER — Encounter: Payer: Self-pay | Admitting: Family Medicine

## 2015-05-15 ENCOUNTER — Ambulatory Visit (INDEPENDENT_AMBULATORY_CARE_PROVIDER_SITE_OTHER): Payer: Medicare Other | Admitting: Family Medicine

## 2015-05-15 VITALS — BP 108/76 | HR 89 | Temp 97.9°F | Ht 67.5 in | Wt 249.6 lb

## 2015-05-15 DIAGNOSIS — S99922D Unspecified injury of left foot, subsequent encounter: Secondary | ICD-10-CM | POA: Diagnosis not present

## 2015-05-15 DIAGNOSIS — Z9889 Other specified postprocedural states: Secondary | ICD-10-CM | POA: Diagnosis not present

## 2015-05-15 NOTE — Progress Notes (Signed)
Patient ID: Martin Bush, male   DOB: 1955-10-21, 60 y.o.   MRN: [id number]  Tommi Rumps, MD Phone: [phone]  Martin Bush is a 60 y.o. male who presents today for same-day visit.  Patient notes left great toenail fell off on Monday. He had previously injured this. He previously had x-rays of her negative for fracture. He finished a course of Keflex. He's not had any pain in the toenail. He is not having fevers. He has not had any redness. He has not been covering this.  Patient did have left eye surgery recently involving his cornea and lens replacement and laser surgery. Notes he has had an enlarged pupil since that time and has been using drops to help keep his pupil enlarged. He notes conjunctival hemorrhage with this. Notes his vision is actually improved following the surgery. Notes no eye pain. He has follow-up with ophthalmology next week.   ROS see history of present illness  Objective  Physical Exam Filed Vitals:   05/15/15 1602  BP: 108/76  Pulse: 89  Temp: 97.9 F (36.6 C)    BP Readings from Last 3 Encounters:  05/15/15 108/76  04/28/15 120/86  04/24/15 104/60   Wt Readings from Last 3 Encounters:  05/15/15 249 lb 9.6 oz (113.218 kg)  04/28/15 252 lb 9.6 oz (114.579 kg)  04/24/15 251 lb 12.8 oz (114.216 kg)    Physical Exam  Constitutional: He is well-developed, well-nourished, and in no distress.  HENT:  Head: Normocephalic and atraumatic.  Eyes:  Left pupil enlarged compared to right pupil, left conjunctival hemorrhage noted, pupils reactive to light  Cardiovascular: Normal rate, regular rhythm and normal heart sounds.   Pulmonary/Chest: Effort normal and breath sounds normal.  Skin: Skin is warm and dry. He is not diaphoretic.  Left great toe missing toenail, proximal aspect of nail has started to regrow, there is no tenderness, there is no drainage, there is no skin breakdown, there is no erythema, there is no warmth, sensation to light touch  intact, 5 out of 5 strength in flexion and extension of great toe     Assessment/Plan: Please see individual problem list.  Injury of left toe Toenail has now fallen off. No signs of infection. Discussed keeping it covered with a bandage or gauze while he has his shoes on. They need to closely monitor this for signs of infection. If the toenail does not grow back he will let us know.  S/P eye surgery Patient reports vision is improved in his left eye following surgery. Notes eye appears stable to him since surgery. He has follow-up with ophthalmology on Monday. Given return precautions.     Tommi Rumps, MD [place]

## 2015-05-15 NOTE — Patient Instructions (Signed)
Nice to see you. Please keep the toe covered with a bandage while wearing shoes. Please make sure you keep a close eye on the toe to ensure that there is no drainage, color change, sensation change, or redness. If these occur you need to let us know. Please keep follow-up with your ophthalmologist. If you develop vision changes, eye pain, or any new symptoms with your eyes please seek medical attention.

## 2015-05-15 NOTE — Assessment & Plan Note (Addendum)
Patient reports vision is improved in his left eye following surgery. Notes eye appears stable to him since surgery. He has follow-up with ophthalmology on Monday. Given return precautions.

## 2015-05-15 NOTE — Assessment & Plan Note (Signed)
Toenail has now fallen off. No signs of infection. Discussed keeping it covered with a bandage or gauze while he has his shoes on. They need to closely monitor this for signs of infection. If the toenail does not grow back he will let us know.

## 2015-05-15 NOTE — Progress Notes (Signed)
Pre visit review using our clinic review tool, if applicable. No additional management support is needed unless otherwise documented below in the visit note. 

## 2015-05-21 DIAGNOSIS — I2510 Atherosclerotic heart disease of native coronary artery without angina pectoris: Secondary | ICD-10-CM | POA: Diagnosis not present

## 2015-05-21 DIAGNOSIS — I429 Cardiomyopathy, unspecified: Secondary | ICD-10-CM | POA: Diagnosis not present

## 2015-05-21 DIAGNOSIS — I255 Ischemic cardiomyopathy: Secondary | ICD-10-CM | POA: Diagnosis not present

## 2015-05-21 DIAGNOSIS — I25810 Atherosclerosis of coronary artery bypass graft(s) without angina pectoris: Secondary | ICD-10-CM | POA: Diagnosis not present

## 2015-05-28 ENCOUNTER — Ambulatory Visit: Payer: Medicare Other | Admitting: Family Medicine

## 2015-06-06 DIAGNOSIS — Z95810 Presence of automatic (implantable) cardiac defibrillator: Secondary | ICD-10-CM | POA: Diagnosis not present

## 2015-06-06 DIAGNOSIS — I255 Ischemic cardiomyopathy: Secondary | ICD-10-CM | POA: Diagnosis not present

## 2015-06-06 DIAGNOSIS — I25810 Atherosclerosis of coronary artery bypass graft(s) without angina pectoris: Secondary | ICD-10-CM | POA: Diagnosis not present

## 2015-06-06 DIAGNOSIS — I2510 Atherosclerotic heart disease of native coronary artery without angina pectoris: Secondary | ICD-10-CM | POA: Diagnosis not present

## 2015-07-10 ENCOUNTER — Encounter (HOSPITAL_COMMUNITY): Payer: Self-pay | Admitting: Emergency Medicine

## 2015-07-10 DIAGNOSIS — N179 Acute kidney failure, unspecified: Secondary | ICD-10-CM | POA: Diagnosis not present

## 2015-07-10 DIAGNOSIS — I5032 Chronic diastolic (congestive) heart failure: Secondary | ICD-10-CM | POA: Insufficient documentation

## 2015-07-10 DIAGNOSIS — E785 Hyperlipidemia, unspecified: Secondary | ICD-10-CM | POA: Diagnosis not present

## 2015-07-10 DIAGNOSIS — Z794 Long term (current) use of insulin: Secondary | ICD-10-CM | POA: Insufficient documentation

## 2015-07-10 DIAGNOSIS — N183 Chronic kidney disease, stage 3 (moderate): Secondary | ICD-10-CM | POA: Diagnosis not present

## 2015-07-10 DIAGNOSIS — I6521 Occlusion and stenosis of right carotid artery: Principal | ICD-10-CM | POA: Insufficient documentation

## 2015-07-10 DIAGNOSIS — R42 Dizziness and giddiness: Secondary | ICD-10-CM | POA: Insufficient documentation

## 2015-07-10 DIAGNOSIS — F329 Major depressive disorder, single episode, unspecified: Secondary | ICD-10-CM | POA: Diagnosis not present

## 2015-07-10 DIAGNOSIS — Z7982 Long term (current) use of aspirin: Secondary | ICD-10-CM | POA: Insufficient documentation

## 2015-07-10 DIAGNOSIS — I10 Essential (primary) hypertension: Secondary | ICD-10-CM | POA: Diagnosis not present

## 2015-07-10 DIAGNOSIS — E11649 Type 2 diabetes mellitus with hypoglycemia without coma: Secondary | ICD-10-CM | POA: Insufficient documentation

## 2015-07-10 DIAGNOSIS — E1122 Type 2 diabetes mellitus with diabetic chronic kidney disease: Secondary | ICD-10-CM | POA: Insufficient documentation

## 2015-07-10 DIAGNOSIS — G4733 Obstructive sleep apnea (adult) (pediatric): Secondary | ICD-10-CM | POA: Diagnosis not present

## 2015-07-10 DIAGNOSIS — E1165 Type 2 diabetes mellitus with hyperglycemia: Secondary | ICD-10-CM | POA: Diagnosis not present

## 2015-07-10 DIAGNOSIS — I2510 Atherosclerotic heart disease of native coronary artery without angina pectoris: Secondary | ICD-10-CM | POA: Insufficient documentation

## 2015-07-10 DIAGNOSIS — I130 Hypertensive heart and chronic kidney disease with heart failure and stage 1 through stage 4 chronic kidney disease, or unspecified chronic kidney disease: Secondary | ICD-10-CM | POA: Insufficient documentation

## 2015-07-10 DIAGNOSIS — I252 Old myocardial infarction: Secondary | ICD-10-CM | POA: Diagnosis not present

## 2015-07-10 LAB — CBC WITH DIFFERENTIAL/PLATELET
Basophils Absolute: 0 10*3/uL (ref 0.0–0.1)
Basophils Relative: 0 %
Eosinophils Absolute: 0.3 10*3/uL (ref 0.0–0.7)
Eosinophils Relative: 3 %
HCT: 38.3 % — ABNORMAL LOW (ref 39.0–52.0)
Hemoglobin: 12.5 g/dL — ABNORMAL LOW (ref 13.0–17.0)
Lymphocytes Relative: 16 %
Lymphs Abs: 1.3 10*3/uL (ref 0.7–4.0)
MCH: 29.8 pg (ref 26.0–34.0)
MCHC: 32.6 g/dL (ref 30.0–36.0)
MCV: 91.4 fL (ref 78.0–100.0)
Monocytes Absolute: 0.7 10*3/uL (ref 0.1–1.0)
Monocytes Relative: 9 %
Neutro Abs: 5.7 10*3/uL (ref 1.7–7.7)
Neutrophils Relative %: 72 %
Platelets: 181 10*3/uL (ref 150–400)
RBC: 4.19 MIL/uL — ABNORMAL LOW (ref 4.22–5.81)
RDW: 13 % (ref 11.5–15.5)
WBC: 8 10*3/uL (ref 4.0–10.5)

## 2015-07-10 LAB — COMPREHENSIVE METABOLIC PANEL
ALT: 29 U/L (ref 17–63)
AST: 29 U/L (ref 15–41)
Albumin: 3.7 g/dL (ref 3.5–5.0)
Alkaline Phosphatase: 72 U/L (ref 38–126)
Anion gap: 9 (ref 5–15)
BUN: 37 mg/dL — ABNORMAL HIGH (ref 6–20)
CO2: 24 mmol/L (ref 22–32)
Calcium: 9.4 mg/dL (ref 8.9–10.3)
Chloride: 101 mmol/L (ref 101–111)
Creatinine, Ser: 1.71 mg/dL — ABNORMAL HIGH (ref 0.61–1.24)
GFR calc Af Amer: 49 mL/min — ABNORMAL LOW (ref >60)
GFR calc non Af Amer: 42 mL/min — ABNORMAL LOW (ref >60)
Glucose, Bld: 203 mg/dL — ABNORMAL HIGH (ref 65–99)
Potassium: 4.4 mmol/L (ref 3.5–5.1)
Sodium: 134 mmol/L — ABNORMAL LOW (ref 135–145)
Total Bilirubin: 0.6 mg/dL (ref 0.3–1.2)
Total Protein: 6.5 g/dL (ref 6.5–8.1)

## 2015-07-10 LAB — I-STAT TROPONIN, ED: Troponin i, poc: 0.01 ng/mL (ref 0.00–0.08)

## 2015-07-10 NOTE — ED Notes (Signed)
Pt st's he has been having periods of dizziness off and on over past 2 days.  Pt denies any chest pain, nausea or vomiting.  Neuro exam in triage neg at this time,

## 2015-07-11 ENCOUNTER — Telehealth: Payer: Self-pay | Admitting: Family Medicine

## 2015-07-11 ENCOUNTER — Emergency Department (HOSPITAL_COMMUNITY): Payer: Medicare Other

## 2015-07-11 ENCOUNTER — Observation Stay (HOSPITAL_COMMUNITY)
Admission: EM | Admit: 2015-07-11 | Discharge: 2015-07-13 | Disposition: A | Discharge status: Home / Self Care | Payer: Medicare Other | Attending: Internal Medicine | Admitting: Internal Medicine

## 2015-07-11 DIAGNOSIS — E785 Hyperlipidemia, unspecified: Secondary | ICD-10-CM

## 2015-07-11 DIAGNOSIS — Z9889 Other specified postprocedural states: Secondary | ICD-10-CM

## 2015-07-11 DIAGNOSIS — G4733 Obstructive sleep apnea (adult) (pediatric): Secondary | ICD-10-CM

## 2015-07-11 DIAGNOSIS — N189 Chronic kidney disease, unspecified: Secondary | ICD-10-CM

## 2015-07-11 DIAGNOSIS — E1169 Type 2 diabetes mellitus with other specified complication: Secondary | ICD-10-CM

## 2015-07-11 DIAGNOSIS — I25810 Atherosclerosis of coronary artery bypass graft(s) without angina pectoris: Secondary | ICD-10-CM

## 2015-07-11 DIAGNOSIS — I6529 Occlusion and stenosis of unspecified carotid artery: Secondary | ICD-10-CM | POA: Insufficient documentation

## 2015-07-11 DIAGNOSIS — N183 Chronic kidney disease, stage 3 unspecified: Secondary | ICD-10-CM

## 2015-07-11 DIAGNOSIS — E1122 Type 2 diabetes mellitus with diabetic chronic kidney disease: Secondary | ICD-10-CM

## 2015-07-11 DIAGNOSIS — F329 Major depressive disorder, single episode, unspecified: Secondary | ICD-10-CM

## 2015-07-11 DIAGNOSIS — Z9989 Dependence on other enabling machines and devices: Secondary | ICD-10-CM

## 2015-07-11 DIAGNOSIS — E119 Type 2 diabetes mellitus without complications: Secondary | ICD-10-CM

## 2015-07-11 DIAGNOSIS — I10 Essential (primary) hypertension: Secondary | ICD-10-CM

## 2015-07-11 DIAGNOSIS — N179 Acute kidney failure, unspecified: Secondary | ICD-10-CM

## 2015-07-11 DIAGNOSIS — I2510 Atherosclerotic heart disease of native coronary artery without angina pectoris: Secondary | ICD-10-CM

## 2015-07-11 DIAGNOSIS — R42 Dizziness and giddiness: Secondary | ICD-10-CM

## 2015-07-11 DIAGNOSIS — I6521 Occlusion and stenosis of right carotid artery: Secondary | ICD-10-CM | POA: Diagnosis not present

## 2015-07-11 DIAGNOSIS — F32A Depression, unspecified: Secondary | ICD-10-CM

## 2015-07-11 LAB — GLUCOSE, CAPILLARY
Glucose-Capillary: 196 mg/dL — ABNORMAL HIGH (ref 65–99)
Glucose-Capillary: 271 mg/dL — ABNORMAL HIGH (ref 65–99)
Glucose-Capillary: 267 mg/dL — ABNORMAL HIGH (ref 65–99)

## 2015-07-11 LAB — CBG MONITORING, ED
Glucose-Capillary: 48 mg/dL — ABNORMAL LOW (ref 65–99)
Glucose-Capillary: 160 mg/dL — ABNORMAL HIGH (ref 65–99)

## 2015-07-11 LAB — TSH: TSH: 1.368 u[IU]/mL (ref 0.350–4.500)

## 2015-07-11 MED ORDER — SERTRALINE HCL 100 MG PO TABS
100.0000 mg | ORAL_TABLET | Freq: Every day | ORAL | Status: DC
  Administered 2015-07-11 – 2015-07-13 (×3): 100 mg via ORAL
  Filled 2015-07-11 (×3): qty 1

## 2015-07-11 MED ORDER — CARVEDILOL 6.25 MG PO TABS
6.2500 mg | ORAL_TABLET | Freq: Two times a day (BID) | ORAL | Status: DC
  Administered 2015-07-11 – 2015-07-13 (×5): 6.25 mg via ORAL
  Filled 2015-07-11 (×6): qty 1

## 2015-07-11 MED ORDER — SODIUM CHLORIDE 0.9% FLUSH
3.0000 mL | Freq: Two times a day (BID) | INTRAVENOUS | Status: DC
  Administered 2015-07-11: 3 mL via INTRAVENOUS
  Administered 2015-07-12: 10 mL via INTRAVENOUS
  Administered 2015-07-12 – 2015-07-13 (×2): 3 mL via INTRAVENOUS

## 2015-07-11 MED ORDER — AMIODARONE HCL 200 MG PO TABS
200.0000 mg | ORAL_TABLET | Freq: Every day | ORAL | Status: DC
  Administered 2015-07-11 – 2015-07-13 (×3): 200 mg via ORAL
  Filled 2015-07-11 (×3): qty 1

## 2015-07-11 MED ORDER — ACETAMINOPHEN 325 MG PO TABS: 650.0000 mg | ORAL_TABLET | Freq: Four times a day (QID) | ORAL | Status: DC | PRN

## 2015-07-11 MED ORDER — ONDANSETRON HCL 4 MG PO TABS: 4.0000 mg | ORAL_TABLET | Freq: Four times a day (QID) | ORAL | Status: DC | PRN

## 2015-07-11 MED ORDER — INSULIN ASPART 100 UNIT/ML ~~LOC~~ SOLN
0.0000 [IU] | Freq: Three times a day (TID) | SUBCUTANEOUS | Status: DC
  Administered 2015-07-11: 5 [IU] via SUBCUTANEOUS
  Administered 2015-07-11: 2 [IU] via SUBCUTANEOUS
  Administered 2015-07-12: 5 [IU] via SUBCUTANEOUS
  Administered 2015-07-12: 9 [IU] via SUBCUTANEOUS

## 2015-07-11 MED ORDER — HYDROCODONE-ACETAMINOPHEN 5-325 MG PO TABS: 1.0000 | ORAL_TABLET | ORAL | Status: DC | PRN

## 2015-07-11 MED ORDER — ENOXAPARIN SODIUM 40 MG/0.4ML ~~LOC~~ SOLN
40.0000 mg | SUBCUTANEOUS | Status: DC
  Administered 2015-07-11 – 2015-07-13 (×3): 40 mg via SUBCUTANEOUS
  Filled 2015-07-11 (×3): qty 0.4

## 2015-07-11 MED ORDER — BISACODYL 5 MG PO TBEC: 5.0000 mg | DELAYED_RELEASE_TABLET | Freq: Every day | ORAL | Status: DC | PRN

## 2015-07-11 MED ORDER — PRAVASTATIN SODIUM 40 MG PO TABS
80.0000 mg | ORAL_TABLET | Freq: Every day | ORAL | Status: DC
  Administered 2015-07-11 – 2015-07-13 (×3): 80 mg via ORAL
  Filled 2015-07-11 (×3): qty 2

## 2015-07-11 MED ORDER — PRASUGREL HCL 10 MG PO TABS
10.0000 mg | ORAL_TABLET | Freq: Every day | ORAL | Status: DC
  Administered 2015-07-11 – 2015-07-13 (×3): 10 mg via ORAL
  Filled 2015-07-11 (×3): qty 1

## 2015-07-11 MED ORDER — ACETAMINOPHEN 650 MG RE SUPP: 650.0000 mg | Freq: Four times a day (QID) | RECTAL | Status: DC | PRN

## 2015-07-11 MED ORDER — ASPIRIN EC 81 MG PO TBEC
81.0000 mg | DELAYED_RELEASE_TABLET | Freq: Every day | ORAL | Status: DC
  Administered 2015-07-11 – 2015-07-13 (×3): 81 mg via ORAL
  Filled 2015-07-11 (×3): qty 1

## 2015-07-11 MED ORDER — DEXTROSE 50 % IV SOLN
25.0000 mL | INTRAVENOUS | Status: DC | PRN
Start: 2015-07-11 — End: 2015-07-13

## 2015-07-11 MED ORDER — POLYETHYLENE GLYCOL 3350 17 G PO PACK: 17.0000 g | PACK | Freq: Every day | ORAL | Status: DC | PRN

## 2015-07-11 MED ORDER — FERROUS GLUCONATE 324 (38 FE) MG PO TABS
324.0000 mg | ORAL_TABLET | Freq: Every day | ORAL | Status: DC
  Administered 2015-07-11 – 2015-07-13 (×3): 324 mg via ORAL
  Filled 2015-07-11 (×3): qty 1

## 2015-07-11 MED ORDER — SODIUM CHLORIDE 0.9 % IV SOLN
INTRAVENOUS | Status: AC
  Administered 2015-07-11: 11:00:00 via INTRAVENOUS

## 2015-07-11 MED ORDER — ONDANSETRON HCL 4 MG/2ML IJ SOLN: 4.0000 mg | Freq: Four times a day (QID) | INTRAMUSCULAR | Status: DC | PRN

## 2015-07-11 NOTE — Progress Notes (Signed)
   07/11/15 1743  Vitals  Temp 98.3 F (36.8 C)  Temp Source Oral  BP 121/78 mmHg  BP Location Left Arm  BP Method Automatic  Patient Position (if appropriate) Lying  Pulse Rate 65  Pulse Rate Source Dinamap  Resp 17  Orthostatic Sitting  BP- Sitting 129/70 mmHg  Pulse- Sitting 68  Orthostatic Standing at 0 minutes  BP- Standing at 0 minutes 121/67 mmHg  Pulse- Standing at 0 minutes 69  Orthostatic Standing at 3 minutes  BP- Standing at 3 minutes 115/70 mmHg  Pulse- Standing at 3 minutes 85    ORTHOSTATIC VS as above.

## 2015-07-11 NOTE — ED Notes (Signed)
CBG 160 

## 2015-07-11 NOTE — ED Provider Notes (Signed)
CSN: [id number]     Arrival date & time 07/10/15  2231 History  By signing my name below, I, Roxine Caddy, attest that this documentation has been prepared under the direction and in the presence of Veryl Speak, MD.  Electronically signed: Roxine Caddy, ED Scribe. 07/11/2015. 3:19 AM.  Chief Complaint  Patient presents with  . Dizziness   Patient is a 60 y.o. male presenting with dizziness. The history is provided by the patient. No language interpreter was used.  Dizziness Quality:  Lightheadedness Severity:  Moderate Onset quality:  Sudden Duration:  2 days Timing:  Constant Context: physical activity and standing up   Relieved by:  None tried Ineffective treatments:  None tried Associated symptoms: headaches and shortness of breath   Associated symptoms: no chest pain and no palpitations    HPI Comments: Martin Bush is a 60 y.o. male with PMHx of CHF, MI, DM, 8 stents, bilateral sciatic nerve impingement, and restless leg syndrome who presents to the Emergency Department complaining of sudden onset, constant dizzizess and light headedness for the past 2 days. Pt states he began to feel dizzy and lost his balance earlier this evening. He notes symptoms are exacerbated with movement, standing up, and exertion. Pt states mild relief of symptoms when sitting down. He also reports a slight, dull headache in the back of his head, as well as some SOB. He denies palpitations or chest pain. Pt notes he has 80% blockage in the right side of his neck. Pt states he currently has a defibrillator. Pt also states recent PSHx of cataract removal. Pt notes he is currently on effient daily. He notes he is currently seen by his cardiologist Dr. Clayton Lefort and PCP at the [facility] medical [facility].   Past Medical History  Diagnosis Date  . Coronary artery disease   . MI (myocardial infarction) ([place])   . High cholesterol   . Hypertension   . Proteinuria   . Sleep apnea     uses cpap  . Diabetes mellitus  without complication (HCC)     insulin dependent  . Arthritis     hands   . Depression   . Colon polyps    Past Surgical History  Procedure Laterality Date  . Cardiac surgery    . Joint replacement    . Cardiac catheterization N/A 07/16/2014    Procedure: Left Heart Cath and Coronary Angiography;  Surgeon: Adrian Prows, MD;  Location: [place] [facility];  Service: Cardiovascular;  Laterality: N/A;  . Cardiac catheterization  07/16/2014    Procedure: Coronary Balloon Angioplasty;  Surgeon: Adrian Prows, MD;  Location: [place] [facility];  Service: Cardiovascular;;   Family History  Problem Relation Age of Onset  . Uterine cancer Mother   . Lung cancer Mother   . Hyperlipidemia Father   . Heart disease Father   . Hypertension Father   . Diabetes Father    Social History  Substance Use Topics  . Smoking status: Never Smoker   . Smokeless tobacco: Former Systems developer  . Alcohol Use: No    Review of Systems  Respiratory: Positive for shortness of breath.   Cardiovascular: Negative for chest pain and palpitations.  Neurological: Positive for dizziness, light-headedness and headaches.  All other systems reviewed and are negative.  Allergies  Review of patient's allergies indicates no known allergies.  Home Medications   Prior to Admission medications   Medication Sig Start Date End Date Taking? Authorizing Provider  amiodarone (PACERONE) 200 MG tablet  11/19/14   Historical Provider, MD  aspirin EC 81 MG tablet Take 81 mg by mouth daily.    Historical Provider, MD  atorvastatin (LIPITOR) 80 MG tablet Take 80 mg by mouth daily. Reported on 02/05/2015    Historical Provider, MD  carvedilol (COREG) 6.25 MG tablet  11/19/14   Historical Provider, MD  cephALEXin (KEFLEX) 500 MG capsule Take 1 capsule (500 mg total) by mouth 4 (four) times daily. 04/24/15   [name], MD  Cholecalciferol (VITAMIN D PO) Take 1 tablet by mouth daily.    Historical Provider, MD  doxycycline (VIBRA-TABS) 100  MG tablet Take 1 tablet (100 mg total) by mouth 2 (two) times daily. 04/24/15   [name], MD  furosemide (LASIX) 40 MG tablet 20 mg.  12/02/14   Historical Provider, MD  insulin regular human CONCENTRATED (HUMULIN R) 500 UNIT/ML injection Inject 100 Units into the skin 2 (two) times daily with a meal.    Historical Provider, MD  lisinopril (PRINIVIL,ZESTRIL) 40 MG tablet Take 40 mg by mouth daily.    Historical Provider, MD  metoprolol (LOPRESSOR) 50 MG tablet Take 50 mg by mouth 2 (two) times daily. Reported on 02/05/2015    Historical Provider, MD  Omega-3 Fatty Acids (FISH OIL PO) Take 1 tablet by mouth daily.    Historical Provider, MD  prasugrel (EFFIENT) 10 MG TABS tablet Take 10 mg by mouth daily.    Historical Provider, MD  pravastatin (PRAVACHOL) 80 MG tablet Take 80 mg by mouth daily.    Historical Provider, MD  sertraline (ZOLOFT) 100 MG tablet Take 100 mg by mouth daily.    Historical Provider, MD  spironolactone (ALDACTONE) 25 MG tablet  01/25/15   Historical Provider, MD   BP 131/82 mmHg  Pulse 61  Resp 20  SpO2 98% Physical Exam  Constitutional: He is oriented to person, place, and time. He appears well-developed and well-nourished. No distress.  HENT:  Head: Normocephalic and atraumatic.  Neck: Normal range of motion.  Cardiovascular: Normal rate, regular rhythm and normal heart sounds.   Pulmonary/Chest: Effort normal and breath sounds normal. He has no wheezes.  Abdominal: Soft.  Neurological: He is alert and oriented to person, place, and time.  Skin: Skin is warm and dry. He is not diaphoretic.  Psychiatric: He has a normal mood and affect. Judgment normal.  Nursing note and vitals reviewed.   ED Course  Procedures  DIAGNOSTIC STUDIES: Oxygen Saturation is 98% on RA, normal by my interpretation.  COORDINATION OF CARE: 2:49 AM Discussed treatment plan including imaging and blood work with pt at bedside and pt agreed to plan.  Labs Review Labs Reviewed   CBC WITH DIFFERENTIAL/PLATELET - Abnormal; Notable for the following:    RBC 4.19 (*)    Hemoglobin 12.5 (*)    HCT 38.3 (*)    All other components within normal limits  COMPREHENSIVE METABOLIC PANEL - Abnormal; Notable for the following:    Sodium 134 (*)    Glucose, Bld 203 (*)    BUN 37 (*)    Creatinine, Ser 1.71 (*)    GFR calc non Af Amer 42 (*)    GFR calc Af Amer 49 (*)    All other components within normal limits  I-STAT TROPOININ, ED    Imaging Review No results found. I have personally reviewed and evaluated these images and lab results as part of my medical decision-making.   EKG Interpretation   Date/Time:  Thursday July 10 2015 22:41:02 EDT Ventricular Rate:  75 PR Interval:  212 QRS Duration: 112 QT Interval:  432 QTC Calculation: 482 R Axis:   77 Text Interpretation:  Atrial-sensed ventricular-paced rhythm with  prolonged AV conduction with occasional Premature ventricular complexes  Abnormal ECG Confirmed by Lita Mains  MD, DAVID ([id number]) on 07/10/2015  10:42:09 PM      MDM   Final diagnoses:  None    Patient is a 60 year old male with history of coronary artery disease with multiple stents, diabetes, cardiomyopathy with defibrillator, and carotid artery disease. He presents today with complaints of dizzy spells that of been occurring with standing for the past several days. His workup today is essentially unremarkable. His EKG is unchanged. I have not found an obvious reason for his dizziness, however his carotid artery stenosis is concerning. His records are all at the [facility] and he believes they told him he may have an 80% stenosis. This study was performed last year. He was to have this carotid artery opened up, however his ejection fraction has remained at 25%.  I head considered an MRI to evaluate the head and neck, however he has a defibrillator in place. I have also considered a CT Angie of, however his creatinine is slightly elevated. I've  discussed this with Dr. Scot Dock from vascular surgery who is recommending a carotid ultrasound to further evaluate the flow in both the carotid and vertebral arteries. I've spoken with Dr. Blaine Hamper who agrees to admit the patient.  I personally performed the services described in this documentation, which was scribed in my presence. The recorded information has been reviewed and is accurate.      Veryl Speak, MD 07/11/15 [phone]

## 2015-07-11 NOTE — ED Notes (Signed)
Pt's wife called out for pt's blood sugar. CBG 48. Pt's wife gave pt glucose tablets; sandwich, orange juice, and sprite zero given.

## 2015-07-11 NOTE — Progress Notes (Signed)
   07/11/15 0808  Vitals  Temp 98.2 F (36.8 C)  Temp Source Oral  BP 138/77 mmHg  BP Location Left Arm  BP Method Automatic  Patient Position (if appropriate) Lying  Pulse Rate 60  Pulse Rate Source Dinamap  Resp 17  Orthostatic Sitting  BP- Sitting 129/84 mmHg  Pulse- Sitting 62  Orthostatic Standing at 0 minutes  BP- Standing at 0 minutes 130/79 mmHg  Pulse- Standing at 0 minutes 71  Oxygen Therapy  SpO2 97 %  O2 Device Room Air     ORTHOSTATIC VS as above.

## 2015-07-11 NOTE — Progress Notes (Signed)
Patient's wife with lots of questions at this time. MD/NP paged   Ave Filter, RN

## 2015-07-11 NOTE — Progress Notes (Signed)
This is a no charge note  Pending on admission per Dr. Stark Jock  60 year old man with past medical history of hypertension, hyperlipidemia, diabetes mellitus, depression, CAD, s/p of stent placement on Effient, s/p of defibrillator, CKD-III, carotid artery stenosis, sCHF, who presents with dizziness for 2 days. No unilateral weakness. Per EDP, pt states that he has 80% R carotid artery stenosis. He notes he is currently seen by his cardiologist Dr. Clayton Lefort and PCP at the [facility]. CT head is negative for acute intracranial abnormalities. Can not do MR due to presence of defibrillator. Pt is accepted to tele bed for obs. Of note, wife states that pt had episode of hypoglycemia with CBG 48, but his blood sugar is 203 by BMP in ED.   Ivor Costa, MD  [facility] Pager [phone]  If 7PM-7AM, please contact night-coverage [email or web address] Password [facility] 07/11/2015, 6:42 AM

## 2015-07-11 NOTE — Telephone Encounter (Signed)
Pt wife called to inform Dr Caryl Bis that pt was admitted into the hospital yesterday for dizziness and headache.    Call wife @ [phone]. Thank you!

## 2015-07-11 NOTE — ED Notes (Signed)
Pt c/o intermittent dizziness x 2 days. Has hx of CABG, defibrillator placement, and carotid "blockages." At present, pt denies dizziness. Neuro exam WNL.

## 2015-07-11 NOTE — Progress Notes (Signed)
Patient arrived from the ED via stretcher, safety precautions and orders reviewed with patient/family. TELE applied and confirmed. No other distress noted. Will continue to monitor.  Ave Filter, RN

## 2015-07-11 NOTE — Procedures (Signed)
Placed patient on CPAP for the night.  Patient is tolerating well at this time. 

## 2015-07-11 NOTE — H&P (Signed)
History and Physical    Martin Bush [id number] DOB: 1955/03/06 DOA: 07/11/2015  PCP: Tommi Rumps, MD  Patient coming from:  home    Chief Complaint:  dizziness  HPI: Martin Bush is a 60 y.o. male with multiple medical problems not limited to CAD/stent placement on Effient, s/p defibrillator, CKD, CHF, HTN, and diabetes. Patient presented to the emergency department with lightheadedness /dizziness for 2 days. Symptoms mainly occur after patient starts walking around or exerts himself. He is having problems with balance, running into things. No vertigo. No chest pain. No SOB. No nausea. No hx of ear infections. He reports posterior headache for two days. Hasn't taken any pain relievers for the headache. No associated visual disturbances. He just had cataract surgery and has new prescription glasses but sees out of them fine.     Review of records reveals patient saw PCP in January for dizziness, head CT negative at that time as well. AICD/pacemaker was interrogated at that time, no events. Troponin was negative. Patient released home with instructions to hold diuretics as there was concern patient was over diuresed  ED Course:  Temp 99.4,  vital signs stable, normal oxygen saturation Troponin negative Creatinine 1.71, up from 1.4 in February Hemoglobin 12 point 5, at baseline. On iron at home. Head CT scan negative for acute abnormalities EKG : Atrial sensed intricately paced rhythm with prolonged AV conduction with occasional PVCs. The rate 75. QTC 482  Review of Systems: As per HPI, otherwise 10 point review of systems negative.    Past Medical History  Diagnosis Date  . Coronary artery disease   . MI (myocardial infarction) ([place])   . High cholesterol   . Hypertension   . Proteinuria   . Sleep apnea     uses cpap  . Diabetes mellitus without complication (HCC)     insulin dependent  . Arthritis     hands   . Depression   . Colon polyps     Past Surgical History    Procedure Laterality Date  . Cardiac surgery    . Joint replacement    . Cardiac catheterization N/A 07/16/2014    Procedure: Left Heart Cath and Coronary Angiography;  Surgeon: Adrian Prows, MD;  Location: [facility];  Service: Cardiovascular;  Laterality: N/A;  . Cardiac catheterization  07/16/2014    Procedure: Coronary Balloon Angioplasty;  Surgeon: Adrian Prows, MD;  Location: [facility];  Service: Cardiovascular;;    Social History   Social History  . Marital Status: Married    Spouse Name: N/A  . Number of Children: N/A  . Years of Education: N/A   Occupational History  . Not on file.   Social History Main Topics  . Smoking status: Never Smoker   . Smokeless tobacco: Former Systems developer  . Alcohol Use: No  . Drug Use: No  . Sexual Activity: Not on file   Other Topics Concern  . Not on file   Social History Narrative  Lives at home with wife. No assistive devices needed for ambulation  No Known Allergies  Family History  Problem Relation Age of Onset  . Uterine cancer Mother   . Lung cancer Mother   . Hyperlipidemia Father   . Heart disease Father   . Hypertension Father   . Diabetes Father     Prior to Admission medications   Medication Sig Start Date End Date Taking? Authorizing Provider  amiodarone (PACERONE) 200 MG tablet Take 200 mg by mouth  daily.  11/19/14  Yes Historical Provider, MD  aspirin EC 81 MG tablet Take 81 mg by mouth daily.   Yes Historical Provider, MD  carvedilol (COREG) 12.5 MG tablet Take 6.25 mg by mouth 2 (two) times daily with a meal.   Yes Historical Provider, MD  ferrous gluconate (FERGON) 324 MG tablet Take 324 mg by mouth daily with breakfast.   Yes Historical Provider, MD  furosemide (LASIX) 40 MG tablet Take 40 mg by mouth daily.  12/02/14  Yes Historical Provider, MD  insulin regular human CONCENTRATED (HUMULIN R) 500 UNIT/ML injection Inject 12-14 Units into the skin 3 (three) times daily with meals. Use 14 units every  morning and at lunch then use 12 units at dinner   Yes Historical Provider, MD  lisinopril (PRINIVIL,ZESTRIL) 40 MG tablet Take 40 mg by mouth daily.   Yes Historical Provider, MD  prasugrel (EFFIENT) 10 MG TABS tablet Take 10 mg by mouth daily.   Yes Historical Provider, MD  pravastatin (PRAVACHOL) 80 MG tablet Take 80 mg by mouth daily.   Yes Historical Provider, MD  sertraline (ZOLOFT) 100 MG tablet Take 100 mg by mouth daily.   Yes Historical Provider, MD  cephALEXin (KEFLEX) 500 MG capsule Take 1 capsule (500 mg total) by mouth 4 (four) times daily. Patient not taking: Reported on 07/11/2015 04/24/15   [name], MD  doxycycline (VIBRA-TABS) 100 MG tablet Take 1 tablet (100 mg total) by mouth 2 (two) times daily. Patient not taking: Reported on 07/11/2015 04/24/15   [name], MD    Physical Exam: Filed Vitals:   07/11/15 0430 07/11/15 0445 07/11/15 0530 07/11/15 0700  BP: 122/77 102/72  104/67  Pulse: 62 60  58  Temp:   98.4 F (36.9 C)   TempSrc:      Resp: 16 11  17   SpO2: 95% 96%  96%    Constitutional:  Obese, white male in NAD, calm, comfortable Filed Vitals:   07/11/15 0430 07/11/15 0445 07/11/15 0530 07/11/15 0700  BP: 122/77 102/72  104/67  Pulse: 62 60  58  Temp:   98.4 F (36.9 C)   TempSrc:      Resp: 16 11  17   SpO2: 95% 96%  96%   Eyes: PER,injected left sclera.  ENMT: Mucous membranes are moist. Posterior pharynx clear of any exudate or lesions.Normal dentition.  Neck: normal, supple, no masses Respiratory: clear to auscultation bilaterally, no wheezing, no crackles. Normal respiratory effort. No accessory muscle use.  Cardiovascular: Regular rate and rhythm, no murmurs / rubs / gallops. No extremity edema. 2+ pedal pulses. Abdomen: obese, no tenderness, no masses palpated. Bowel sounds positive.  Musculoskeletal: no clubbing / cyanosis. No joint deformity upper and lower extremities. Good ROM, no contractures. Normal muscle tone.  Skin: no  rashes, lesions, ulcers.  Neurologic: CN 2-12 grossly intact. Sensation intact, Strength 5/5 in all 4.  Psychiatric: Normal judgment and insight. Alert and oriented x 3. Normal mood.    Labs on Admission: I have personally reviewed following labs and imaging studies   Radiological Exams on Admission: Ct Head Wo Contrast  07/11/2015  CLINICAL DATA:  Intermittent dizziness for 2 days. History of hypertension and diabetes. EXAM: CT HEAD WITHOUT CONTRAST TECHNIQUE: Contiguous axial images were obtained from the base of the skull through the vertex without intravenous contrast. COMPARISON:  02/05/2015 FINDINGS: Mild cerebral atrophy. No ventricular dilatation. No mass effect or midline shift. No abnormal extra-axial fluid collections. Gray-white matter junctions are  distinct. Basal cisterns are not effaced. No evidence of acute intracranial hemorrhage. No depressed skull fractures. Mucosal thickening in the paranasal sinuses. Mastoid air cells are not opacified. Vascular calcifications. IMPRESSION: No acute intracranial abnormalities.  Mild atrophy. Electronically Signed   By: Lucienne Capers M.D.   On: 07/11/2015 05:07    EKG: Independently reviewed.   EKG Interpretation  Date/Time:  Thursday July 10 2015 22:41:02 EDT Ventricular Rate:  75 PR Interval:  212 QRS Duration: 112 QT Interval:  432 QTC Calculation: 482 R Axis:   77 Text Interpretation:  Atrial-sensed ventricular-paced rhythm with prolonged AV conduction with occasional Premature ventricular complexes Abnormal ECG Confirmed by YELVERTON  MD, DAVID ([id number]) on 07/10/2015 10:42:09 PM      TEE late April this year: Left ventricle cavity mildly dilated, moderate concentric hypertrophy of the left ventricle, markedly generalized hypokinesis. The estimated LV ejection fraction markedly depressed 25-30% Doppler evidence of grade 2 diastolic dysfunction  Assessment/Plan      Dizziness. Started 2 days ago. Head CT scan negative for acute  abnormalities . Trop negative. EKG without acute changes. Vital signs stable, I have ordered a set of orthostatic vitals to be done. Unable to obtain MRI because of ppm/ defib. Patient apparently has known right carotid artery disease (records at [facility]). EDP spoke with Vascular (Dr. Scot Dock) who recommends carotid u/s  -place in OBS - tele -orthostatic vitals pending -carotid u/s (bilateral) -Gentle IV hydration -Hold home BP meds / diuretics today -Defibrillator interrogation -TSH -If evaluation negative and dizziness persists, consider PTvestibular evalution     CAD. Significant CAD with multiple coronary angiograms requiring interventions over the last 17 years, last one June 2016 when patient underwent balloon angioplasty after presenting with a non-STEMI. No chest pain, negative troponin, no acute EKG changes.  -Continue home Effient -Continue home statin  AKI on CKD stage 3. Baseline creatinine 1.4 to 1.7 today.  -avoid nephrotoxic medications -hold lasix today -gentle IV hydration x 8 hours -am bmet  Hx of diastolic CHF.TEE late April with findings of grade 2 DD, LVEF 25-39%. No evidence for volume overload at present    -Continue home carvedilol -Daily weights -Daily intake and output -Hold Lasix today given AKI on CKD.  DM2, uncontrolled. Blood glucose running high at home. Episode of hypoglycemia here in the emergency department around 5:20 AM. CBG 48 at the time. I don't see that this was treated but glucose 160 around 8 AM. Last A1c in Epic was 12.6 on 07/04/14 -Hold home insulin -CBGs, sliding scale insulin (sensitive for now) -A1c   OSA -cpap  Hyperlipidemia -Continue home statin  Hypertension, controlled -Hold home diuretics and BP medications until orthostatic hypotension ruled out  Depression, stable.  -continue home Zoloft                                     DVT prophylaxis:   Lov  Code Status:   Full code  Family Communication: Wife in room and she  understands and agrees with plan of treatment  Disposition Plan:  Discharge home in 24-48 hours              Consults called:    EDP consulted Vascular over phone, recommendations given  Admission status:  Observation -  telemetry    Tye Savoy NP [facility] Pager [phone]  If 7PM-7AM, please contact night-coverage [email or web address] Password [email or web address]  07/11/2015, 7:20 AM

## 2015-07-11 NOTE — Telephone Encounter (Signed)
Noted. I was already aware.

## 2015-07-11 NOTE — ED Notes (Signed)
Provider at bedside

## 2015-07-11 NOTE — ED Notes (Signed)
Dr.Delo at bedside for update. Two RN's have attempted IV without success twice. Third RN at bedside for [place] IV

## 2015-07-11 NOTE — Telephone Encounter (Signed)
Spoke with patient,  He stated that he was admitted to [facility]. His wife just wanted to let Dr. Caryl Bis know where he is at and what will be happening to him. Please advise.

## 2015-07-11 NOTE — ED Notes (Signed)
Pt taken to CT.

## 2015-07-12 ENCOUNTER — Observation Stay (HOSPITAL_BASED_OUTPATIENT_CLINIC_OR_DEPARTMENT_OTHER): Payer: Medicare Other

## 2015-07-12 DIAGNOSIS — N179 Acute kidney failure, unspecified: Secondary | ICD-10-CM | POA: Diagnosis not present

## 2015-07-12 DIAGNOSIS — I6523 Occlusion and stenosis of bilateral carotid arteries: Secondary | ICD-10-CM | POA: Diagnosis not present

## 2015-07-12 DIAGNOSIS — R42 Dizziness and giddiness: Secondary | ICD-10-CM | POA: Diagnosis not present

## 2015-07-12 LAB — GLUCOSE, CAPILLARY
Glucose-Capillary: 264 mg/dL — ABNORMAL HIGH (ref 65–99)
Glucose-Capillary: 291 mg/dL — ABNORMAL HIGH (ref 65–99)
Glucose-Capillary: 398 mg/dL — ABNORMAL HIGH (ref 65–99)
Glucose-Capillary: 364 mg/dL — ABNORMAL HIGH (ref 65–99)
Glucose-Capillary: 361 mg/dL — ABNORMAL HIGH (ref 65–99)

## 2015-07-12 LAB — HEMOGLOBIN A1C
Hgb A1c MFr Bld: 10.1 % — ABNORMAL HIGH (ref 4.8–5.6)
Mean Plasma Glucose: 243 mg/dL

## 2015-07-12 LAB — CBC
HCT: 36.5 % — ABNORMAL LOW (ref 39.0–52.0)
Hemoglobin: 11.5 g/dL — ABNORMAL LOW (ref 13.0–17.0)
MCH: 28.9 pg (ref 26.0–34.0)
MCHC: 31.5 g/dL (ref 30.0–36.0)
MCV: 91.7 fL (ref 78.0–100.0)
Platelets: 158 10*3/uL (ref 150–400)
RBC: 3.98 MIL/uL — ABNORMAL LOW (ref 4.22–5.81)
RDW: 12.9 % (ref 11.5–15.5)
WBC: 5.7 10*3/uL (ref 4.0–10.5)

## 2015-07-12 LAB — BASIC METABOLIC PANEL
Anion gap: 9 (ref 5–15)
BUN: 27 mg/dL — ABNORMAL HIGH (ref 6–20)
CO2: 25 mmol/L (ref 22–32)
Calcium: 8.9 mg/dL (ref 8.9–10.3)
Chloride: 99 mmol/L — ABNORMAL LOW (ref 101–111)
Creatinine, Ser: 1.44 mg/dL — ABNORMAL HIGH (ref 0.61–1.24)
GFR calc Af Amer: 60 mL/min — ABNORMAL LOW (ref >60)
GFR calc non Af Amer: 52 mL/min — ABNORMAL LOW (ref >60)
Glucose, Bld: 291 mg/dL — ABNORMAL HIGH (ref 65–99)
Potassium: 4.5 mmol/L (ref 3.5–5.1)
Sodium: 133 mmol/L — ABNORMAL LOW (ref 135–145)

## 2015-07-12 MED ORDER — INSULIN ASPART 100 UNIT/ML ~~LOC~~ SOLN
0.0000 [IU] | Freq: Three times a day (TID) | SUBCUTANEOUS | Status: DC
  Administered 2015-07-12: 20 [IU] via SUBCUTANEOUS
  Administered 2015-07-13: 11 [IU] via SUBCUTANEOUS
  Administered 2015-07-13: 7 [IU] via SUBCUTANEOUS

## 2015-07-12 MED ORDER — INSULIN ASPART 100 UNIT/ML ~~LOC~~ SOLN
0.0000 [IU] | Freq: Every day | SUBCUTANEOUS | Status: DC
  Administered 2015-07-12: 5 [IU] via SUBCUTANEOUS

## 2015-07-12 MED ORDER — SODIUM CHLORIDE 0.9 % IV SOLN
INTRAVENOUS | Status: DC
  Administered 2015-07-12: 12:00:00 via INTRAVENOUS

## 2015-07-12 NOTE — Progress Notes (Signed)
PROGRESS NOTE    Martin Bush  [id number] DOB: 1955/02/20 DOA: 07/11/2015 PCP: Tommi Rumps, MD    Brief Narrative: Martin Bush is a 60 y.o. male with a Past Medical History of CAD s/p cath, CK D, CHF, HTN, and diabetes mellitus type 2, carotid disease presents with complaints of "dizziness". He was found to be in acute renal failure, he was admitted to telemetry and started on gentle hydration. Carotid duplex ordered to evaluate further.   Assessment & Plan:   Principal Problem:   Lightheadedness Active Problems:   Diabetes ([place])   S/P eye surgery   Hyperlipidemia   Essential hypertension   CAD (coronary artery disease)   Depression   CKD (chronic kidney disease), stage III   AKI (acute kidney injury) (HCC)   OSA on CPAP   Carotid artery stenosis   Dizziness: probably from acute renal failure from  dehydration vs bilateral carotid disease.  Dr Scot Dock consulted by EDP , recommended a carotid duplex. Prelim reports shows >80% Right ICA stenosis. 60-79% Left ICA stenosis. Vertebral artery flow is antegrade.  Further management as per vascular.    Acute renal failure : dehydration , pre renal.  Improving with hydration.  Monitor.    Hypertension: Controlled.    Diabetes mellitus: CBG (last 3)   Recent Labs  07/12/15 0627 07/12/15 1142 07/12/15 1403  GLUCAP 264* 291* 398*    Not well controlled.  Change to resistant scale.     CAD: Currently chest pain free, follows with Dr Einar Gip.  Resume Effient, pravachol.   DVT prophylaxis: (Lovenox/) Code Status: (Full/ Family Communication: none at bedside. Disposition Plan: pending further management.    Consultants:   Dr Scot Dock called by EDP vascular surgery.    Procedures: vascular duplex.    Antimicrobials: none   Subjective: Reports dizziness improved.   Objective: Filed Vitals:   07/12/15 0600 07/12/15 0855 07/12/15 1032 07/12/15 1359  BP:  126/65 116/66 112/63  Pulse:  65 64 78    Temp:  98.6 F (37 C) 98.3 F (36.8 C) 98.5 F (36.9 C)  TempSrc:  Oral Oral Oral  Resp:  18 20 20   Weight: 113.7 kg (250 lb 10.6 oz)     SpO2:  96% 96% 98%    Intake/Output Summary (Last 24 hours) at 07/12/15 1609 Last data filed at 07/11/15 2120  Gross per 24 hour  Intake  730.5 ml  Output      0 ml  Net  730.5 ml   Filed Weights   07/12/15 0600  Weight: 113.7 kg (250 lb 10.6 oz)    Examination:  General exam: Appears calm and comfortable  Respiratory system: Clear to auscultation. Respiratory effort normal. Cardiovascular system: S1 & S2 heard, RRR. No JVD, murmurs, rubs, gallops or clicks. No pedal edema. Gastrointestinal system: Abdomen is nondistended, soft and nontender. No organomegaly or masses felt. Normal bowel sounds heard. Central nervous system: Alert and oriented. No focal neurological deficits. Extremities: Symmetric 5 x 5 power. Skin: No rashes, lesions or ulcers Psychiatry: Judgement and insight appear normal. Mood & affect appropriate.     Data Reviewed: I have personally reviewed following labs and imaging studies  CBC:  Recent Labs Lab 07/10/15 2248 07/12/15 0525  WBC 8.0 5.7  NEUTROABS 5.7  --   HGB 12.5* 11.5*  HCT 38.3* 36.5*  MCV 91.4 91.7  PLT 181 0000000   Basic Metabolic Panel:  Recent Labs Lab 07/10/15 2248 07/12/15 0525  NA 134* 133*  K 4.4  4.5  CL 101 99*  CO2 24 25  GLUCOSE 203* 291*  BUN 37* 27*  CREATININE 1.71* 1.44*  CALCIUM 9.4 8.9   GFR: Estimated Creatinine Clearance: 67.1 mL/min (by C-G formula based on Cr of 1.44). Liver Function Tests:  Recent Labs Lab 07/10/15 2248  AST 29  ALT 29  ALKPHOS 72  BILITOT 0.6  PROT 6.5  ALBUMIN 3.7   No results for input(s): LIPASE, AMYLASE in the last 168 hours. No results for input(s): AMMONIA in the last 168 hours. Coagulation Profile: No results for input(s): INR, PROTIME in the last 168 hours. Cardiac Enzymes: No results for input(s): CKTOTAL, CKMB,  CKMBINDEX, TROPONINI in the last 168 hours. BNP (last 3 results) No results for input(s): PROBNP in the last 8760 hours. HbA1C:  Recent Labs  07/11/15 1252  HGBA1C 10.1*   CBG:  Recent Labs Lab 07/11/15 1725 07/11/15 2121 07/12/15 0627 07/12/15 1142 07/12/15 1403  GLUCAP 271* 267* 264* 291* 398*   Lipid Profile: No results for input(s): CHOL, HDL, LDLCALC, TRIG, CHOLHDL, LDLDIRECT in the last 72 hours. Thyroid Function Tests:  Recent Labs  07/11/15 1252  TSH 1.368   Anemia Panel: No results for input(s): VITAMINB12, FOLATE, FERRITIN, TIBC, IRON, RETICCTPCT in the last 72 hours. Sepsis Labs: No results for input(s): PROCALCITON, LATICACIDVEN in the last 168 hours.  No results found for this or any previous visit (from the past 240 hour(s)).       Radiology Studies: Ct Head Wo Contrast  07/11/2015  CLINICAL DATA:  Intermittent dizziness for 2 days. History of hypertension and diabetes. EXAM: CT HEAD WITHOUT CONTRAST TECHNIQUE: Contiguous axial images were obtained from the base of the skull through the vertex without intravenous contrast. COMPARISON:  02/05/2015 FINDINGS: Mild cerebral atrophy. No ventricular dilatation. No mass effect or midline shift. No abnormal extra-axial fluid collections. Gray-white matter junctions are distinct. Basal cisterns are not effaced. No evidence of acute intracranial hemorrhage. No depressed skull fractures. Mucosal thickening in the paranasal sinuses. Mastoid air cells are not opacified. Vascular calcifications. IMPRESSION: No acute intracranial abnormalities.  Mild atrophy. Electronically Signed   By: Lucienne Capers M.D.   On: 07/11/2015 05:07        Scheduled Meds: . amiodarone  200 mg Oral Daily  . aspirin EC  81 mg Oral Daily  . carvedilol  6.25 mg Oral BID WC  . enoxaparin (LOVENOX) injection  40 mg Subcutaneous Q24H  . ferrous gluconate  324 mg Oral Q breakfast  . insulin aspart  0-9 Units Subcutaneous TID WC  .  prasugrel  10 mg Oral Daily  . pravastatin  80 mg Oral Daily  . sertraline  100 mg Oral Daily  . sodium chloride flush  3 mL Intravenous Q12H   Continuous Infusions: . sodium chloride 75 mL/hr at 07/12/15 1136        Time spent: 50 minute.s     Cashe Gatt, MD [facility] Pager [phone]  If 7PM-7AM, please contact night-coverage [email or web address] Password [email or web address] 07/12/2015, 4:09 PM

## 2015-07-12 NOTE — Progress Notes (Addendum)
VASCULAR LAB PRELIMINARY  PRELIMINARY  PRELIMINARY  PRELIMINARY  Carotid duplex completed.    Preliminary report:  >80% Right ICA stenosis. 60-79% Left ICA stenosis.  Vertebral artery flow is antegrade.   Critical results given to [facility], and text paged to Dr. Karleen Hampshire.  Jamesmichael Shadd, RVT 07/12/2015, 11:28 AM

## 2015-07-13 DIAGNOSIS — E119 Type 2 diabetes mellitus without complications: Secondary | ICD-10-CM

## 2015-07-13 DIAGNOSIS — I6523 Occlusion and stenosis of bilateral carotid arteries: Secondary | ICD-10-CM | POA: Diagnosis not present

## 2015-07-13 DIAGNOSIS — I2510 Atherosclerotic heart disease of native coronary artery without angina pectoris: Secondary | ICD-10-CM | POA: Diagnosis not present

## 2015-07-13 DIAGNOSIS — R42 Dizziness and giddiness: Secondary | ICD-10-CM

## 2015-07-13 DIAGNOSIS — Z794 Long term (current) use of insulin: Secondary | ICD-10-CM

## 2015-07-13 DIAGNOSIS — N179 Acute kidney failure, unspecified: Secondary | ICD-10-CM | POA: Diagnosis not present

## 2015-07-13 LAB — GLUCOSE, CAPILLARY
Glucose-Capillary: 250 mg/dL — ABNORMAL HIGH (ref 65–99)
Glucose-Capillary: 299 mg/dL — ABNORMAL HIGH (ref 65–99)

## 2015-07-13 MED ORDER — INSULIN REGULAR HUMAN (CONC) 500 UNIT/ML ~~LOC~~ SOLN: 12.0000 [IU] | Freq: Three times a day (TID) | SUBCUTANEOUS | Status: DC

## 2015-07-13 MED ORDER — LISINOPRIL 40 MG PO TABS: 40.0000 mg | ORAL_TABLET | Freq: Every day | ORAL | Status: DC

## 2015-07-13 MED ORDER — FUROSEMIDE 40 MG PO TABS: 40.0000 mg | ORAL_TABLET | Freq: Every day | ORAL | Status: DC

## 2015-07-13 NOTE — Progress Notes (Signed)
Pt discharge education and instructions complete with pt and spouse at bedside; both voices understanding and denies any questions. Pt IV and telemetry removed; pt discharge home with spouse to transport him home. Pt transported off unit via wheelchair with belongings and spouse to the side. Francis Gaines Jakya Dovidio RN.

## 2015-07-13 NOTE — Discharge Instructions (Signed)
Follow with Primary MD Tommi Rumps, MD in 7 days   Get CBC, CMP,  checked  by Primary MD next visit.    Activity: As tolerated with Full fall precautions use walker/cane & assistance as needed   Disposition Home    Diet: Heart Healthy , carbohydrate modified , with feeding assistance and aspiration precautions.  For Heart failure patients - Check your Weight same time everyday, if you gain over 2 pounds, or you develop in leg swelling, experience more shortness of breath or chest pain, call your Primary MD immediately. Follow Cardiac Low Salt Diet and 1.5 lit/day fluid restriction.   On your next visit with your primary care physician please Get Medicines reviewed and adjusted.   Please request your Prim.MD to go over all Hospital Tests and Procedure/Radiological results at the follow up, please get all Hospital records sent to your Prim MD by signing hospital release before you go home.   If you experience worsening of your admission symptoms, develop shortness of breath, life threatening emergency, suicidal or homicidal thoughts you must seek medical attention immediately by calling 911 or calling your MD immediately  if symptoms less severe.  You Must read complete instructions/literature along with all the possible adverse reactions/side effects for all the Medicines you take and that have been prescribed to you. Take any new Medicines after you have completely understood and accpet all the possible adverse reactions/side effects.   Do not drive, operating heavy machinery, perform activities at heights, swimming or participation in water activities or provide baby sitting services if your were admitted for syncope or siezures until you have seen by Primary MD or a Neurologist and advised to do so again.  Do not drive when taking Pain medications.    Do not take more than prescribed Pain, Sleep and Anxiety Medications  Special Instructions: If you have smoked or chewed Tobacco   in the last 2 yrs please stop smoking, stop any regular Alcohol  and or any Recreational drug use.  Wear Seat belts while driving.   Please note  You were cared for by a hospitalist during your hospital stay. If you have any questions about your discharge medications or the care you received while you were in the hospital after you are discharged, you can call the unit and asked to speak with the hospitalist on call if the hospitalist that took care of you is not available. Once you are discharged, your primary care physician will handle any further medical issues. Please note that NO REFILLS for any discharge medications will be authorized once you are discharged, as it is imperative that you return to your primary care physician (or establish a relationship with a primary care physician if you do not have one) for your aftercare needs so that they can reassess your need for medications and monitor your lab values.

## 2015-07-13 NOTE — Discharge Summary (Signed)
Martin Bush, is a 60 y.o. male  DOB 1955-08-27  MRN [id number].  Admission date:  07/11/2015  Admitting Physician  Ivor Costa, MD  Discharge Date:  07/13/2015   Primary MD  Tommi Rumps, MD  Recommendations for primary care physician for things to follow:  - Please check CBC, BMP during next visit, patient was instructed to hold Lasix for 3 days. - Patient to follow with vascular surgery as an outpatient regarding right ICA stenosis   Admission Diagnosis  Dizziness [R42] Carotid artery stenosis, right [I65.21]   Discharge Diagnosis  Dizziness [R42] Carotid artery stenosis, right [I65.21]    Principal Problem:   Lightheadedness Active Problems:   Diabetes ([place])   S/P eye surgery   Hyperlipidemia   Essential hypertension   CAD (coronary artery disease)   Depression   CKD (chronic kidney disease), stage III   AKI (acute kidney injury) ([place])   OSA on CPAP   Carotid artery stenosis      Past Medical History  Diagnosis Date  . Coronary artery disease   . MI (myocardial infarction) ([place])   . High cholesterol   . Hypertension   . Proteinuria   . Sleep apnea     uses cpap  . Diabetes mellitus without complication (HCC)     insulin dependent  . Arthritis     hands   . Depression   . Colon polyps     Past Surgical History  Procedure Laterality Date  . Cardiac surgery    . Joint replacement    . Cardiac catheterization N/A 07/16/2014    Procedure: Left Heart Cath and Coronary Angiography;  Surgeon: Adrian Prows, MD;  Location: [facility];  Service: Cardiovascular;  Laterality: N/A;  . Cardiac catheterization  07/16/2014    Procedure: Coronary Balloon Angioplasty;  Surgeon: Adrian Prows, MD;  Location: [facility];  Service: Cardiovascular;;       History of present illness and  Hospital Course:     Kindly see H&P for history of present illness and admission details,  please review complete Labs, Consult reports and Test reports for all details in brief  HPI  from the history and physical done on the day of admission  Martin Bush is a 60 y.o. male with multiple medical problems not limited to CAD/stent placement on Effient, s/p defibrillator, CKD, CHF, HTN, and diabetes. Patient presented to the emergency department with lightheadedness /dizziness for 2 days. Symptoms mainly occur after patient starts walking around or exerts himself. He is having problems with balance, running into things. No vertigo. No chest pain. No SOB. No nausea. No hx of ear infections. He reports posterior headache for two days. Hasn't taken any pain relievers for the headache. No associated visual disturbances. He just had cataract surgery and has new prescription glasses but sees out of them fine.   Review of records reveals patient saw PCP in January for dizziness, head CT negative at that time as well. AICD/pacemaker was interrogated at that time, no events.  Troponin was negative. Patient released home with instructions to hold diuretics as there was concern patient was over diuresed  ED Course:  Temp 99.4, vital signs stable, normal oxygen saturation Troponin negative Creatinine 1.71, up from 1.4 in February Hemoglobin 12 point 5, at baseline. On iron at home. Head CT scan negative for acute abnormalities EKG : Atrial sensed intricately paced rhythm with prolonged AV conduction with occasional PVCs. The rate 75. QTC 482  Hospital Course   Dizziness  - This is most likely due to dehydration and volume depletion, evidenced by elevation in his baseline creatinine, his Lasix was held, and was gently hydrated, no recurrence of symptoms, CT head with no acute findings, unable to obtain MRI because of AICD, patient was not orthostatic.  Right ICA stenosis - Patient with known history of right ICA stenosis, showing > 80% stenosis, has no focal symptoms, unlikely if it's  contributing to his dizziness. - Continue with aspirin, Effient and statin - Discussed with vascular surgery Dr. Donnetta Hutching, they will arrange for follow-up next week regarding possible need for surgical intervention  CAD - Followed by Dr. Einar Gip, denies any chest pain or shortness of breath - Status post balloon angioplasty last year, continue with aspirin, Effient and statin  History of present illness on CKD stage III - Creatinine baseline 1.4, was 1.7 of admission, currently back to baseline, improved with gentle hydration, back to baseline, patient instructed to hold his Lasix for for 1 day on discharge.  Diastolic CHF - Appears to be euvolemic, compensated, resume Lasix and 1 day from discharge.  Diabetes mellitus - Patient presents with hypoglycemia CBG of 48, he reports it is mentioned recurrent 20s hypoglycemic in the early a.m., instructed to decrease evening time insulin U5 100 from 12-9, and to continue with 14 before lunch and breakfast - A1c pending at time of discharge  Hyperlipidemia -Continue home statin  Hypertension, controlled -Hold home diuretics and BP medications until orthostatic hypotension ruled out  Depression, stable.  -continue home Zoloft   Discharge Condition:  stable   Follow UP  Follow-up Information    Follow up with Tommi Rumps, MD. Schedule an appointment as soon as possible for a visit in 1 week.   Specialty:  Family Medicine   Contact information:   [place] [address] [phone]       Follow up with Curt Jews, MD.   Specialties:  Vascular Surgery, Cardiology   Why:  Will be contacted with an appointment for right ICA stenosis   Contact information:   [place] [address] [phone]         Discharge Instructions  and  Discharge Medications        Discharge Instructions    Discharge instructions    Complete by:  As directed   Follow with  Primary MD Tommi Rumps, MD in 7 days   Get CBC, CMP,  checked  by Primary MD next visit.    Activity: As tolerated with Full fall precautions use walker/cane & assistance as needed   Disposition Home    Diet: Heart Healthy , carbohydrate modified , with feeding assistance and aspiration precautions.  For Heart failure patients - Check your Weight same time everyday, if you gain over 2 pounds, or you develop in leg swelling, experience more shortness of breath or chest pain, call your Primary MD immediately. Follow Cardiac Low Salt Diet and 1.5 lit/day fluid restriction.   On your next visit with  your primary care physician please Get Medicines reviewed and adjusted.   Please request your Prim.MD to go over all Hospital Tests and Procedure/Radiological results at the follow up, please get all Hospital records sent to your Prim MD by signing hospital release before you go home.   If you experience worsening of your admission symptoms, develop shortness of breath, life threatening emergency, suicidal or homicidal thoughts you must seek medical attention immediately by calling 911 or calling your MD immediately  if symptoms less severe.  You Must read complete instructions/literature along with all the possible adverse reactions/side effects for all the Medicines you take and that have been prescribed to you. Take any new Medicines after you have completely understood and accpet all the possible adverse reactions/side effects.   Do not drive, operating heavy machinery, perform activities at heights, swimming or participation in water activities or provide baby sitting services if your were admitted for syncope or siezures until you have seen by Primary MD or a Neurologist and advised to do so again.  Do not drive when taking Pain medications.    Do not take more than prescribed Pain, Sleep and Anxiety Medications  Special Instructions: If you have smoked or chewed Tobacco  in the  last 2 yrs please stop smoking, stop any regular Alcohol  and or any Recreational drug use.  Wear Seat belts while driving.   Please note  You were cared for by a hospitalist during your hospital stay. If you have any questions about your discharge medications or the care you received while you were in the hospital after you are discharged, you can call the unit and asked to speak with the hospitalist on call if the hospitalist that took care of you is not available. Once you are discharged, your primary care physician will handle any further medical issues. Please note that NO REFILLS for any discharge medications will be authorized once you are discharged, as it is imperative that you return to your primary care physician (or establish a relationship with a primary care physician if you do not have one) for your aftercare needs so that they can reassess your need for medications and monitor your lab values.     Increase activity slowly    Complete by:  As directed             Medication List    STOP taking these medications        cephALEXin 500 MG capsule  Commonly known as:  KEFLEX     doxycycline 100 MG tablet  Commonly known as:  VIBRA-TABS      TAKE these medications        amiodarone 200 MG tablet  Commonly known as:  PACERONE  Take 200 mg by mouth daily.     aspirin EC 81 MG tablet  Take 81 mg by mouth daily.     carvedilol 12.5 MG tablet  Commonly known as:  COREG  Take 6.25 mg by mouth 2 (two) times daily with a meal.     ferrous gluconate 324 MG tablet  Commonly known as:  FERGON  Take 324 mg by mouth daily with breakfast.     furosemide 40 MG tablet  Commonly known as:  LASIX  Take 1 tablet (40 mg total) by mouth daily. Please resume in  days, 07/15/2015  Start taking on:  07/15/2015     insulin regular human CONCENTRATED 500 UNIT/ML injection  Commonly known as:  HUMULIN R  Inject 0.02-0.03  mLs (10-15 Units total) into the skin 3 (three) times daily with  meals. Use 14 units every morning and at lunch then use 9 units at dinner     lisinopril 40 MG tablet  Commonly known as:  PRINIVIL,ZESTRIL  Take 1 tablet (40 mg total) by mouth daily.     prasugrel 10 MG Tabs tablet  Commonly known as:  EFFIENT  Take 10 mg by mouth daily.     pravastatin 80 MG tablet  Commonly known as:  PRAVACHOL  Take 80 mg by mouth daily.     sertraline 100 MG tablet  Commonly known as:  ZOLOFT  Take 100 mg by mouth daily.          Diet and Activity recommendation: See Discharge Instructions above   Consults obtained -  none   Major procedures and Radiology Reports - PLEASE review detailed and final reports for all details, in brief -     Ct Head Wo Contrast  07/11/2015  CLINICAL DATA:  Intermittent dizziness for 2 days. History of hypertension and diabetes. EXAM: CT HEAD WITHOUT CONTRAST TECHNIQUE: Contiguous axial images were obtained from the base of the skull through the vertex without intravenous contrast. COMPARISON:  02/05/2015 FINDINGS: Mild cerebral atrophy. No ventricular dilatation. No mass effect or midline shift. No abnormal extra-axial fluid collections. Gray-white matter junctions are distinct. Basal cisterns are not effaced. No evidence of acute intracranial hemorrhage. No depressed skull fractures. Mucosal thickening in the paranasal sinuses. Mastoid air cells are not opacified. Vascular calcifications. IMPRESSION: No acute intracranial abnormalities.  Mild atrophy. Electronically Signed   By: Lucienne Capers M.D.   On: 07/11/2015 05:07    Micro Results     No results found for this or any previous visit (from the past 240 hour(s)).     Today   Subjective:   Martin Bush today has no headache,no chest abdominal pain,no new weakness tingling or numbness, feels much better wants to go home today.   Objective:   Blood pressure 124/65, pulse 61, temperature 98.3 F (36.8 C), temperature source Oral, resp. rate 20, weight  115.3 kg (254 lb 3.1 oz), SpO2 98 %.   Intake/Output Summary (Last 24 hours) at 07/13/15 1145 Last data filed at 07/13/15 0435  Gross per 24 hour  Intake    480 ml  Output   1650 ml  Net  -1170 ml    Exam Awake Alert, Oriented x 3, No new F.N deficits, Normal affect [place].AT,PERRAL Supple Neck,No JVD, No cervical lymphadenopathy appriciated.  Symmetrical Chest wall movement, Good air movement bilaterally, CTAB RRR,No Gallops,Rubs or new Murmurs, No Parasternal Heave +ve B.Sounds, Abd Soft, Non tender, No organomegaly appriciated, No rebound -guarding or rigidity. No Cyanosis, Clubbing or edema, No new Rash or bruise  Data Review   CBC w Diff:  Lab Results  Component Value Date   WBC 5.7 07/12/2015   HGB 11.5* 07/12/2015   HCT 36.5* 07/12/2015   PLT 158 07/12/2015   LYMPHOPCT 16 07/10/2015   MONOPCT 9 07/10/2015   EOSPCT 3 07/10/2015   BASOPCT 0 07/10/2015    CMP:  Lab Results  Component Value Date   NA 133* 07/12/2015   K 4.5 07/12/2015   CL 99* 07/12/2015   CO2 25 07/12/2015   BUN 27* 07/12/2015   CREATININE 1.44* 07/12/2015   PROT 6.5 07/10/2015   ALBUMIN 3.7 07/10/2015   BILITOT 0.6 07/10/2015   ALKPHOS 72 07/10/2015   AST 29 07/10/2015   ALT 29  07/10/2015  .   Total Time in preparing paper work, data evaluation and todays exam - 35 minutes  Martin Bush M.D on 07/13/2015 at 11:45 AM  [facility]   Office  [phone]

## 2015-07-14 ENCOUNTER — Telehealth: Payer: Self-pay

## 2015-07-14 ENCOUNTER — Telehealth: Payer: Self-pay | Admitting: Vascular Surgery

## 2015-07-14 LAB — HEMOGLOBIN A1C
Hgb A1c MFr Bld: 10.8 % — ABNORMAL HIGH (ref 4.8–5.6)
Mean Plasma Glucose: 263 mg/dL

## 2015-07-14 NOTE — Telephone Encounter (Signed)
-----   Message from Mena Goes, RN sent at 07/14/2015  9:42 AM EDT ----- Regarding: schedule   ----- Message -----    From: Rosetta Posner, MD    Sent: 07/13/2015  11:34 AM      To: [facility] Charge Pool  Called by hospitalist.  Pt to be discharged today.  Has asymptomatic high grade R ica stenosis.  Needs to see me in 1-2 weeks for ov.  No vasc lab needed

## 2015-07-14 NOTE — Telephone Encounter (Signed)
Sched appt 6/27 at 2:15. Lm on hm# to inform pt of appt.

## 2015-07-14 NOTE — Telephone Encounter (Signed)
Unable to reach patient.  No answer.  No voice mail.  Will continue to follow up with transitional care management as appropriate.

## 2015-07-15 ENCOUNTER — Telehealth: Payer: Self-pay

## 2015-07-15 NOTE — Telephone Encounter (Signed)
Transition Care Management Follow-up Telephone Call   Date discharged? 07/13/15   How have you been since you were released from the hospital? [place].  NO DIZZINESS. NO PAIN. NO N/V/D.  NO PROBLEMS EATING/DRINKING.  NO CHEST PAIN. NO SOB. NOT CHECKING DAILY WEIGHT YET. NO LEG SWELLING. NO HEADACHE.     Do you understand why you were in the hospital? YES, I WAS VERY DIZZY AND AND WALKING INTO THINGS.  RIGHT ICA STENOSIS   Do you understand the discharge instructions? YES, TO DRINK PLENTY OF WATER. HOLD LASIX X3 DAYS. INCREASE ACTIVITY AS TOLERATED. DECREASE EVENING TIME INSULIN U5 100 FROM 12-9, AND CONTINUE WITH 14 BEFORE LUNCH AND BREAKFAST. REVIEW AND ADJUST MEDICATIONS.    Where were you discharged to? HOME.   Items Reviewed:  Medications reviewed: YES, STOPPED TAKING CEPHLAXIN 500MG , DOXYCYCLINE 100MG .  CONTINUING ALL HOME MEDICATIONS AS PRESCRIBED.    Allergies reviewed: YES,   Dietary changes reviewed: YES, HEART HEALTHY DIET  Referrals reviewed: YES, FOLLOW UP WITH VASCULAR SURGEON AT [facility] PCP WITH REPEAT CBC, BMP.   Functional Questionnaire:   Activities of Daily Living (ADLs):   He states they are independent in the following: INDEPENDENT IN ALL ADLS. States they require assistance with the following: DOES NOT REQUIRE ASSISTANCE AT THIS TIME.  VERBALIZES HE IS TO USE WALKER/CANE FOR ASSISTANCE AS NEEDED.   Any transportation issues/concerns?: NO.   Any patient concerns? NONE.   Confirmed importance and date/time of follow-up visits scheduled YES, APPOINTMENT SCHEDULED 07/18/15 AT 11:30.  Provider Appointment booked with Dr. Caryl Bis (PCP).  Confirmed with patient if condition begins to worsen call PCP or go to the ER.  Patient was given the office number and encouraged to call back with question or concerns.  : YES, PATIENT VERBALIZED UNDERSTANDING.

## 2015-07-16 ENCOUNTER — Encounter: Payer: Self-pay | Admitting: Vascular Surgery

## 2015-07-18 ENCOUNTER — Ambulatory Visit: Payer: Medicare Other | Admitting: Family Medicine

## 2015-07-22 ENCOUNTER — Ambulatory Visit: Payer: Medicare Other | Admitting: Vascular Surgery

## 2015-07-23 ENCOUNTER — Encounter: Payer: Self-pay | Admitting: Family Medicine

## 2015-07-23 ENCOUNTER — Ambulatory Visit (INDEPENDENT_AMBULATORY_CARE_PROVIDER_SITE_OTHER): Payer: Medicare Other | Admitting: Family Medicine

## 2015-07-23 VITALS — BP 118/62 | HR 86 | Temp 98.1°F | Ht 67.5 in | Wt 254.8 lb

## 2015-07-23 DIAGNOSIS — Z794 Long term (current) use of insulin: Secondary | ICD-10-CM

## 2015-07-23 DIAGNOSIS — E119 Type 2 diabetes mellitus without complications: Secondary | ICD-10-CM | POA: Diagnosis not present

## 2015-07-23 DIAGNOSIS — R42 Dizziness and giddiness: Secondary | ICD-10-CM | POA: Diagnosis not present

## 2015-07-23 DIAGNOSIS — I509 Heart failure, unspecified: Secondary | ICD-10-CM | POA: Insufficient documentation

## 2015-07-23 DIAGNOSIS — I6523 Occlusion and stenosis of bilateral carotid arteries: Secondary | ICD-10-CM

## 2015-07-23 DIAGNOSIS — I5022 Chronic systolic (congestive) heart failure: Secondary | ICD-10-CM | POA: Diagnosis not present

## 2015-07-23 MED ORDER — INSULIN REGULAR HUMAN (CONC) 500 UNIT/ML ~~LOC~~ SOLN: 14.0000 [IU] | Freq: Three times a day (TID) | SUBCUTANEOUS | Status: DC

## 2015-07-23 NOTE — Assessment & Plan Note (Signed)
Weight is stable. Asymptomatic at this time. Discussed continuing Lasix and monitoring for weight gain and symptoms. Given return precautions.

## 2015-07-23 NOTE — Assessment & Plan Note (Signed)
Patient prefers to see vascular surgery through the [facility] [place]. He has an appointment next week and will let us know when they are scheduling his vascular surgery appointment.

## 2015-07-23 NOTE — Assessment & Plan Note (Signed)
Resolved. Likely related to dehydration relating to over diuresis. Discussed staying well-hydrated and continuing Lasix at this time. Discussed that has recommended recheck lab work though he would like to hold off until his visit with the [facility] next week. He was advised that he needed a CBC and a CMP and thus was put in writing in his AVS. Continue to monitor has recurrence of symptoms to seek medical attention. Given return precautions.

## 2015-07-23 NOTE — Assessment & Plan Note (Signed)
Not well controlled at this time. Recent A1c 10.8. Reports he does occasionally see an endocrinologist at the [facility] though is unsure when his next appointment is. Discussed increasing lunchtime insulin to 16 units and evening insulin to 14 units. Discussed taking his morning dose of insulin if he eats. If he is not seeing the endocrinologist soon at the [facility] [place] we will refer him to a local endocrinologist. Burnis Medin continue to monitor sugars. Watch diet and exercise.

## 2015-07-23 NOTE — Progress Notes (Signed)
Patient ID: Martin Bush, male   DOB: 18-May-1955, 60 y.o.   MRN: [id number]  Martin Rumps, MD Phone: [phone]  Martin Bush is a 60 y.o. male who presents today for follow-up.  Patient seen in follow-up from hospitalization. Hospitalized for dizziness that it occurred with some exertion. Workup revealed likely cause is dehydration. Did find greater than 80% internal carotid artery stenosis. It was recommended that he follow-up with vascular surgery. He was advised to hold his Lasix and stay well hydrated. Other workup was negative. He reports no lightheadedness at this time. No chest pain or shortness of breath. No numbness or weakness. He has started back on the Lasix as he felt as though he is getting some mild swelling in his legs. No orthopnea or PND. No weight gain. His A1c was noted to be elevated to 10.8 in the hospital. He is currently taking 14 units of Humulin R U500 with lunch and 12 units with dinner. He is supposed to be taking 14 units in the morning as well though rarely takes this as he does not eat breakfast. No polyuria or polydipsia. No recent lows. He is up-to-date on yearly eye exams.  PMH: nonsmoker.   ROS see history of present illness  Objective  Physical Exam Filed Vitals:   07/23/15 1132  BP: 118/62  Pulse: 86  Temp: 98.1 F (36.7 C)    BP Readings from Last 3 Encounters:  07/23/15 118/62  07/13/15 124/65  05/15/15 108/76   Wt Readings from Last 3 Encounters:  07/23/15 254 lb 12.8 oz (115.577 kg)  07/13/15 254 lb 3.1 oz (115.3 kg)  05/15/15 249 lb 9.6 oz (113.218 kg)    Physical Exam  Constitutional: He is well-developed, well-nourished, and in no distress.  HENT:  Head: Normocephalic and atraumatic.  Right Ear: External ear normal.  Left Ear: External ear normal.  Cardiovascular: Normal rate, regular rhythm and normal heart sounds.   Pulmonary/Chest: Effort normal and breath sounds normal.  Musculoskeletal: He exhibits no edema.    Neurological: He is alert. Gait normal.  Skin: Skin is warm and dry. He is not diaphoretic.     Assessment/Plan: Please see individual problem list.  Lightheadedness Resolved. Likely related to dehydration relating to over diuresis. Discussed staying well-hydrated and continuing Lasix at this time. Discussed that has recommended recheck lab work though he would like to hold off until his visit with the [place] next week. He was advised that he needed a CBC and a CMP and thus was put in writing in his AVS. Continue to monitor has recurrence of symptoms to seek medical attention. Given return precautions.  Diabetes ([place]) Not well controlled at this time. Recent A1c 10.8. Reports he does occasionally see an endocrinologist at the [facility] though is unsure when his next appointment is. Discussed increasing lunchtime insulin to 16 units and evening insulin to 14 units. Discussed taking his morning dose of insulin if he eats. If he is not seeing the endocrinologist soon at the [place] we will refer him to a local endocrinologist. Martin Bush continue to monitor sugars. Watch diet and exercise.  CHF (congestive heart failure) (HCC) Weight is stable. Asymptomatic at this time. Discussed continuing Lasix and monitoring for weight gain and symptoms. Given return precautions.  Carotid artery stenosis Patient prefers to see vascular surgery through the [place]. He has an appointment next week and will let us know when they are scheduling his vascular surgery appointment.    No orders of the defined types were placed  in this encounter.    Meds ordered this encounter  Medications  . insulin regular human CONCENTRATED (HUMULIN R) 500 UNIT/ML injection    Sig: Inject 0.03 mLs (15 Units total) into the skin 3 (three) times daily with meals. Use 14 units every morning if eating a meal and 16 units at lunch then use 14 units at dinner    Dispense:  20 mL    Refill:  0   Martin Rumps, MD [place]

## 2015-07-23 NOTE — Progress Notes (Signed)
Pre visit review using our clinic review tool, if applicable. No additional management support is needed unless otherwise documented below in the visit note. 

## 2015-07-23 NOTE — Patient Instructions (Addendum)
Nice to see you. I'm glad you're doing better. Please stay well hydrated. Please continue your Lasix. [facility] and BMP checked at your next visit next week. We're going to have you increased your lunchtime insulin dosage to 16 units and your dinnertime insulin to 14 units. If you eat breakfast please use 14 units of insulin as well. If you do not eat breakfast do not use the insulin in the morning. If you develop recurrence of lightheadedness or any new symptoms such as chest pain, shortness of breath, numbness, weakness, or any new or changing symptoms please seek medical attention.

## 2015-08-28 ENCOUNTER — Encounter: Payer: Self-pay | Admitting: Family Medicine

## 2015-08-28 ENCOUNTER — Ambulatory Visit (INDEPENDENT_AMBULATORY_CARE_PROVIDER_SITE_OTHER): Payer: Medicare Other | Admitting: Family Medicine

## 2015-08-28 DIAGNOSIS — E119 Type 2 diabetes mellitus without complications: Secondary | ICD-10-CM | POA: Diagnosis not present

## 2015-08-28 DIAGNOSIS — R42 Dizziness and giddiness: Secondary | ICD-10-CM

## 2015-08-28 DIAGNOSIS — Z794 Long term (current) use of insulin: Secondary | ICD-10-CM

## 2015-08-28 DIAGNOSIS — I6523 Occlusion and stenosis of bilateral carotid arteries: Secondary | ICD-10-CM | POA: Diagnosis not present

## 2015-08-28 MED ORDER — INSULIN REGULAR HUMAN (CONC) 500 UNIT/ML ~~LOC~~ SOLN: 14.0000 [IU] | Freq: Three times a day (TID) | SUBCUTANEOUS | 0 refills | Status: DC

## 2015-08-28 NOTE — Progress Notes (Signed)
Pre visit review using our clinic review tool, if applicable. No additional management support is needed unless otherwise documented below in the visit note. 

## 2015-08-28 NOTE — Assessment & Plan Note (Signed)
Asymptomatic. No carotid bruits. Sees vascular surgery today.

## 2015-08-28 NOTE — Assessment & Plan Note (Signed)
Is not well controlled. Discussed referral to endocrinology though he wants to wait one week and see if he is from the [place]. We will increase his insulin to 15 units with breakfast and lunch and 13 units with dinner. If he does not hear from them he will let us know and we can refer to endocrinology locally.

## 2015-08-28 NOTE — Assessment & Plan Note (Signed)
No recurrence. Did have some anemia on his prior lab work. Reports he had lab work through the [place] though has not heard anything. Asymptomatic. We will request lab work from the [place].

## 2015-08-28 NOTE — Progress Notes (Signed)
  Tommi Rumps, MD Phone: [phone]  Martin Bush is a 60 y.o. male who presents today for follow-up.  DIABETES Disease Monitoring: Blood Sugar ranges-200-400 Polyuria/phagia/dipsia- no      Medications: Compliance- taking Humulin R 14 units with breakfast and lunch and 12 units with dinner Hypoglycemic symptoms- rare, eat something and improves Reports his diet has been significantly poorer than usual.  Anemia: Patient notes he had lab work done several weeks ago to follow-up on this through the [place]. Has not heard anything regarding this. No bleeding from anywhere. No chest pain, shortness of breath, lightheadedness, or palpitations.  Carotid artery stenosis: Patient reports no recurrent lightheadedness. He has an appointment with vascular surgery today at the [place].   PMH: nonsmoker.   ROS see history of present illness  Objective  Physical Exam Vitals:   08/28/15 1046  BP: 130/76  Pulse: 85  Temp: 97.9 F (36.6 C)    BP Readings from Last 3 Encounters:  08/28/15 130/76  07/23/15 118/62  07/13/15 124/65   Wt Readings from Last 3 Encounters:  08/28/15 252 lb 12.8 oz (114.7 kg)  07/23/15 254 lb 12.8 oz (115.6 kg)  07/13/15 254 lb 3.1 oz (115.3 kg)    Physical Exam  Constitutional: No distress.  HENT:  Head: Normocephalic and atraumatic.  Cardiovascular: Normal rate, regular rhythm and normal heart sounds.   No carotid bruits  Pulmonary/Chest: Effort normal and breath sounds normal.  Neurological: He is alert. Gait normal.  Skin: Skin is warm and dry. He is not diaphoretic.     Assessment/Plan: Please see individual problem list.  Carotid artery stenosis Asymptomatic. No carotid bruits. Sees vascular surgery today.  Diabetes ([place]) Is not well controlled. Discussed referral to endocrinology though he wants to wait one week and see if he is from the [place]. We will increase his insulin to 15 units with breakfast and lunch and 13 units with dinner. If he does  not hear from them he will let us know and we can refer to endocrinology locally.  Lightheadedness No recurrence. Did have some anemia on his prior lab work. Reports he had lab work through the [place] though has not heard anything. Asymptomatic. We will request lab work from the [place].   No orders of the defined types were placed in this encounter.   Meds ordered this encounter  Medications  . insulin regular human CONCENTRATED (HUMULIN R) 500 UNIT/ML injection    Sig: Inject 0.03 mLs (15 Units total) into the skin 3 (three) times daily with meals. Use 15 units every morning if eating a meal and 15 units at lunch then use 13 units at dinner    Dispense:  20 mL    Refill:  0     Tommi Rumps, MD [place]

## 2015-08-28 NOTE — Patient Instructions (Addendum)
Nice to see you. Please keep your appointment with the vascular surgeon for your carotid artery stenosis. We are going to increase your insulin to 15 units with breakfast if you eat, 15 units with lunch if you eat, and 13 units with dinner if you eat. We will also request records. If you get persistent sugars greater than 400 or less than 80 please let us know. If you do not hear from the [place] regarding an endocrinology appointment in the next week please call us so we can get her in to see an endocrinologist.

## 2015-09-10 DIAGNOSIS — I472 Ventricular tachycardia: Secondary | ICD-10-CM | POA: Diagnosis not present

## 2015-09-16 DIAGNOSIS — I472 Ventricular tachycardia: Secondary | ICD-10-CM | POA: Diagnosis not present

## 2015-12-10 DIAGNOSIS — I255 Ischemic cardiomyopathy: Secondary | ICD-10-CM | POA: Diagnosis not present

## 2015-12-10 DIAGNOSIS — I2510 Atherosclerotic heart disease of native coronary artery without angina pectoris: Secondary | ICD-10-CM | POA: Diagnosis not present

## 2015-12-10 DIAGNOSIS — Z95810 Presence of automatic (implantable) cardiac defibrillator: Secondary | ICD-10-CM | POA: Diagnosis not present

## 2015-12-10 DIAGNOSIS — I25810 Atherosclerosis of coronary artery bypass graft(s) without angina pectoris: Secondary | ICD-10-CM | POA: Diagnosis not present

## 2016-01-05 DIAGNOSIS — I472 Ventricular tachycardia: Secondary | ICD-10-CM | POA: Diagnosis not present

## 2016-01-26 HISTORY — PX: CATARACT EXTRACTION: SUR2

## 2016-02-16 ENCOUNTER — Telehealth: Payer: Self-pay | Admitting: Pharmacist

## 2016-02-16 NOTE — Telephone Encounter (Signed)
02/16/16  Patient has been identified as a quality metric gap with an A1C of >9% and meets criteria to meet with clinic pharmacist.  Have discussed case with Dr Caryl Bis and he is in agreement.  Have scheduled appointment with pharmacist next week to discuss/manage diabetes.  Bennye Alm, PharmD, [place] PGY2 Pharmacy Resident [phone]

## 2016-02-16 NOTE — Telephone Encounter (Signed)
I agree that patient should see the pharmacist for follow-up of his diabetes.

## 2016-02-23 ENCOUNTER — Ambulatory Visit: Payer: Medicare Other | Admitting: Pharmacist

## 2016-03-01 ENCOUNTER — Ambulatory Visit (INDEPENDENT_AMBULATORY_CARE_PROVIDER_SITE_OTHER): Payer: Medicare Other | Admitting: Pharmacist

## 2016-03-01 ENCOUNTER — Encounter: Payer: Self-pay | Admitting: Pharmacist

## 2016-03-01 DIAGNOSIS — Z794 Long term (current) use of insulin: Secondary | ICD-10-CM

## 2016-03-01 DIAGNOSIS — E119 Type 2 diabetes mellitus without complications: Secondary | ICD-10-CM

## 2016-03-01 MED ORDER — INSULIN REGULAR HUMAN (CONC) 500 UNIT/ML ~~LOC~~ SOPN: 85.0000 [IU] | PEN_INJECTOR | Freq: Two times a day (BID) | SUBCUTANEOUS | 0 refills | Status: DC

## 2016-03-01 MED ORDER — EMPAGLIFLOZIN 10 MG PO TABS
10.0000 mg | ORAL_TABLET | Freq: Every day | ORAL | 0 refills | Status: DC
Start: 2016-03-01 — End: 2016-03-15

## 2016-03-01 NOTE — Progress Notes (Signed)
S:    Patient arrives in good spirits ambulating without assistance.  Presents for diabetes evaluation, education, and management at the request of Dr Caryl Bis. Patient was referred on 02/16/2016 (telephone note due to patients persistent elevated A1C).  Patient was last seen by Primary Care Provider on 08/28/2015.   Patient reports he goes to the [place] for diabetes management and receives his medications from the [place].  He reports last A1C at the [place] was 11% in early January.  [facility] [phone])   Patient reports Diabetes was diagnosed at age 61 years old. Reports he has been on insulin for the last 4 years.    Patient reports adherence with medications.  Current diabetes medications include: U-500 90 units QAM and 80 units QPM (increased following his visit at the [place] in early January), metformin 1000 mg BID Current hypertension medications include: carvedilol 12.5 mg BID, lisinopril 40 mg daily  Patient reports hypoglycemic events 1x/month.  Reports proper treatment  Patient reported dietary habits: Eats 2 meals/day.  Lunch biggest meal Breakfast:Often skips Lunch: couple sandwiches, burger, [employer] meatloaf Dinner: Salad with balsamic vinegar dressing or couple pieces of pizza or steak  Snacks: cheese crackers  Drinks:diet coke or unsweet tea.    Patient reported exercise habits: Very limited exercise.  Limited due to heart problems.    Reports shortness of breath a couple of times per day with exertion.   Slight lower extremity edema on exam today.  Reports weighing himself every couple of days.    Patient denies nocturia.  Patient denies neuropathy. Patient denies visual changes. Reports last eye exam 06/2015.   Patient reports self foot exams. Denies changes.   Reports he checks CBGs 1-2x/day.  Does not like pricking his fingers and is inquiring about 14 day CGM Libre.  O:  Lab Results  Component Value Date   HGBA1C 10.8 (H)  07/12/2015   Vitals:   03/01/16 1014  BP: 121/79  Pulse: 63    Home fasting CBG: 175, 298, 198, 241, 106, 332, 214, 179, 393, 245 mg/dL  Pre Dinner CBG: 190, 425, 305, 300, 382, 330 mg/dL   A/P: Diabetes longstanding diagnosed currently uncontrolled with elevated CBGs throughout the day. Patient reports hypoglycemic events rarely 1x/month and is able to verbalize appropriate hypoglycemia management plan. Patient reports adherence with medication. Control is suboptimal due to diet and insulin resistance.  Following discussion and approval by Dr Caryl Bis, the following medication changes were made:  Continue metformin 1000 mg BID Increased Insulin U-500 to 95 units in the morning and 85 units in the evening Counseled on signs/symptoms/treatment of hypoglycemia Started Jardiance (empagliflozin) 10 mg daily. Samples were provided until Jardiance can be approved by the [place].  Jardiance was started due to patients history of ASCVD, heart failure, and to improve prandial control of CBGs which remain elevated despite increased doses of U-500 insulin.  Next A1C anticipated 3 months.   Will Followup 14 day CGM (Libre by [employer]) to see if it can be approved by patients medicare   ASCVD risk greater than 7.5%. Continued Aspirin 81 mg, prasugrel 10 mg daily and Continued pravastatin 80 mg daily.   Hypertension longstanding diagnosed currently controlled.  Patient reports adherence with medication. Continue current medications.  Written patient instructions provided.  Total time in face to face counseling 45 minutes.   Follow up in Pharmacist Clinic Visit in 2 weeks.     Patient was seen with  Dr Caryl Bis today in clinic and medication changes were discussed and approved prior to initiation

## 2016-03-01 NOTE — Patient Instructions (Addendum)
Start Jardiance 10 mg once daily.  Please stay well hydrated to less than 2 liters per day Increase U-500 to 95 units in the morning and 85 units in the evening  Please continue to check your blood glucose twice daily and bring meter to next visit  Followup with Bennye Alm, PharmD in 2 weeks.

## 2016-03-02 NOTE — Progress Notes (Signed)
I have reviewed the above note and discussed this with the pharmacist.  Tommi Rumps, M.D.

## 2016-03-15 ENCOUNTER — Ambulatory Visit (INDEPENDENT_AMBULATORY_CARE_PROVIDER_SITE_OTHER): Payer: Medicare Other | Admitting: Pharmacist

## 2016-03-15 ENCOUNTER — Encounter: Payer: Self-pay | Admitting: Pharmacist

## 2016-03-15 DIAGNOSIS — Z794 Long term (current) use of insulin: Secondary | ICD-10-CM

## 2016-03-15 DIAGNOSIS — Z23 Encounter for immunization: Secondary | ICD-10-CM

## 2016-03-15 DIAGNOSIS — I10 Essential (primary) hypertension: Secondary | ICD-10-CM

## 2016-03-15 DIAGNOSIS — E119 Type 2 diabetes mellitus without complications: Secondary | ICD-10-CM

## 2016-03-15 MED ORDER — EMPAGLIFLOZIN 25 MG PO TABS: 25.0000 mg | ORAL_TABLET | Freq: Every day | ORAL | 1 refills | Status: DC

## 2016-03-15 MED ORDER — EMPAGLIFLOZIN 25 MG PO TABS: 25.0000 mg | ORAL_TABLET | Freq: Every day | ORAL | 0 refills | Status: DC

## 2016-03-15 NOTE — Assessment & Plan Note (Signed)
Hypertension longstanding diagnosed currently controlled.  Patient reports adherence with medication. Continue current medications. 

## 2016-03-15 NOTE — Patient Instructions (Addendum)
Increase Jardiance to 25 mg daily.   [facility] ([facility] for you.  I have sent in one prescription to your pharmacy to last you until you can be seen at the [place].  Continue Humulin U-500 95 units every morning and 85 units every evening.   Please call clinic if you have any low blood glucose.    Followup with Bennye Alm, PharmD in 4 weeks.

## 2016-03-15 NOTE — Assessment & Plan Note (Signed)
Diabetes longstanding  diagnosed currently uncontrolled but with less fluctuation in blood glucose since starting Jardiance. Patient denies hypoglycemic events and is able to verbalize appropriate hypoglycemia management plan. Patient reports adherence with medication. Control is suboptimal due to diet and insulin resistance. Following discussion and approval by Dr Caryl Bis, the following medication changes were made:  Increase Jardiance to 25 mg daily.  Samples provided and prescription sent to patient pharmacy.  Instructed patient to ask [place] provider to prescribe Jardiance.   Continue Humulin U-500 95 units QAM and 85 units QPM.  Instructed patient to continue checking CBGs twice daily and to call clinic if he experiences hypoglycemia.

## 2016-03-15 NOTE — Progress Notes (Signed)
    S:    Patient arrives in good spirits ambulating without assistance but complaining of new back pain today.  Presents for diabetes evaluation, education, and management at the request of Dr Caryl Bis. Patient was referred on 02/16/16 (telephone note).  Patient was last seen by Primary Care Provider on 08/28/15 and last seen in pharmacy clinic on 03/01/16. Today patient does report some back pain which started around 1 week ago in which he has not tried anything to alleviate the pain. Today patient also reports less fluctuation with his blood glucose. He states that he has followup at the [facility] in March.  Patient reports adherence with medications.  Current diabetes medications include: Humulin U-500 95 units QAM and 85 units QPM, metformin 1000 mg BID, Jardiance 10 mg daily Current hypertension medications include: lisinopril 40 mg daily, carvedilol 6.25 mg BID  Patient denies hypoglycemic events.   Patient reported dietary habits: Reports he is going to change his eating habits.  Is going to change up the size of his evening meal to make it smaller.    Patient reported exercise habits: Doing things around the house.     Patient denies nocturia.  Denies/pain burning upon urination.  Patient denies neuropathy. Patient denies visual changes. Reports last eye exam in June 2017 with [facility].   Patient reports self foot exams. Denies changes.   O:  Lab Results  Component Value Date   HGBA1C 10.8 (H) 07/12/2015   Vitals:   03/15/16 0906  BP: 130/80  Pulse: 81    Home fasting CBG: 283, 208, 287, 121, 163, 162, 247, 219, 254, 229, 259, 328, 72, 168, 321 mg/dL  Before supper CBG: 327, 258, 246, 328, 187 mg/dL   A/P: Diabetes longstanding  diagnosed currently uncontrolled but with less fluctuation in blood glucose since starting Jardiance. Patient denies hypoglycemic events and is able to verbalize appropriate hypoglycemia management plan. Patient reports adherence with  medication. Control is suboptimal due to diet and insulin resistance. Following discussion and approval by Dr Caryl Bis, the following medication changes were made:  Increase Jardiance to 25 mg daily.  Samples provided and prescription sent to patient pharmacy.  Instructed patient to ask [place] provider to prescribe Jardiance.   Continue Humulin U-500 95 units QAM and 85 units QPM.  Instructed patient to continue checking CBGs twice daily and to call clinic if he experiences hypoglycemia.  Flu Vaccine has been given in clinic today.   ASCVD risk greater than 7.5%. Continued Aspirin 81 mg and Continued pravastatin 80 mg daily.   Hypertension longstanding diagnosed currently controlled.  Patient reports adherence with medication. Continue current medications.  Back Pain: Examined by Dr Caryl Bis in clinic.  Likely muscle strain. Patient may use heat or tylenol OTC.  Patient instructed if he has lack of bowel/urinary control to seek immediate care.   Written patient instructions provided.  Total time in face to face counseling 40 minutes.   Follow up in Pharmacist Clinic Visit in 4 weeks.     Patient was seen with Dr Caryl Bis today in clinic and medication changes were discussed and approved prior to initiation

## 2016-03-15 NOTE — Assessment & Plan Note (Signed)
ASCVD risk greater than 7.5%. Continued Aspirin 81 mg and Continued pravastatin 80 mg daily.  

## 2016-03-18 NOTE — Progress Notes (Signed)
Patient seen day of visit with pharmacist. Agree with assessment and plan as outlined by the pharmacist. Patient has had about a week of back discomfort in his right low back. Has progressively improved. No numbness or weakness. No loss of bowel or bladder function. No saddle anesthesia. No midline spine tenderness, no midline spine step-off, no muscular back tenderness. 5 out of 5 strength bilateral quads, hamstrings, plantar flexion, and dorsiflexion, sensation to light touch intact in bilateral lower extremities. Suspect muscular strain. Advised on Tylenol and heat. Given return precautions.  Tommi Rumps, M.D.

## 2016-03-30 ENCOUNTER — Ambulatory Visit (INDEPENDENT_AMBULATORY_CARE_PROVIDER_SITE_OTHER): Payer: Medicare Other

## 2016-03-30 VITALS — BP 120/78 | HR 63 | Temp 97.8°F | Resp 14 | Ht 67.0 in | Wt 257.1 lb

## 2016-03-30 DIAGNOSIS — Z0000 Encounter for general adult medical examination without abnormal findings: Secondary | ICD-10-CM

## 2016-03-30 NOTE — Progress Notes (Signed)
I have reviewed the above note and agree.  Curtistine Pettitt, M.D.  

## 2016-03-30 NOTE — Patient Instructions (Addendum)
  Martin Bush , Thank you for taking time to come for your Medicare Wellness Visit. I appreciate your ongoing commitment to your health goals. Please review the following plan we discussed and let me know if I can assist you in the future.   Follow up with Dr. Caryl Bis as needed.    Have a great day!  These are the goals we discussed: Goals    . Increase physical activity       This is a list of the screening recommended for you and due dates:  Health Maintenance  Topic Date Due  . Hemoglobin A1C  01/11/2016  . Eye exam for diabetics  04/24/2016  . Complete foot exam   12/29/2016  . Colon Cancer Screening  05/18/2018  . Pneumococcal vaccine (2) 11/15/2018  . Tetanus Vaccine  02/26/2024  . Flu Shot  Completed  .  Hepatitis C: One time screening is recommended by Center for Disease Control  (CDC) for  adults born from [birth year] through 1965.   Completed  . HIV Screening  Completed

## 2016-03-30 NOTE — Progress Notes (Signed)
Subjective:   Martin Bush is a 61 y.o. male who presents for an Initial Medicare Annual Wellness Visit.  Review of Systems  No ROS.  Medicare Wellness Visit.  Cardiac Risk Factors include: male gender;obesity (BMI >30kg/m2);diabetes mellitus;hypertension    Objective:    Today's Vitals   03/30/16 1328  BP: 120/78  Pulse: 63  Resp: 14  Temp: 97.8 F (36.6 C)  TempSrc: Oral  SpO2: 95%  Weight: 257 lb 1.9 oz (116.6 kg)  Height: 5\' 7"  (1.702 m)   Body mass index is 40.27 kg/m.  Current Medications (verified) Outpatient Encounter Prescriptions as of 03/30/2016  Medication Sig  . amiodarone (PACERONE) 200 MG tablet Take 200 mg by mouth daily.   [name] Kitchen aspirin EC 81 MG tablet Take 81 mg by mouth daily.  . carvedilol (COREG) 12.5 MG tablet Take 6.25 mg by mouth 2 (two) times daily with a meal.  . empagliflozin (JARDIANCE) 25 MG TABS tablet Take 25 mg by mouth daily.  . empagliflozin (JARDIANCE) 25 MG TABS tablet Take 25 mg by mouth daily.  . ferrous gluconate (FERGON) 324 MG tablet Take 324 mg by mouth daily with breakfast.  . furosemide (LASIX) 40 MG tablet Take 1 tablet (40 mg total) by mouth daily. Please resume in  days, 07/15/2015  . insulin regular human CONCENTRATED (HUMULIN R U-500 KWIKPEN) 500 UNIT/ML kwikpen Inject 85-95 Units into the skin 2 (two) times daily with a meal. Inject 95 units in the morning and 85 in the evening  . lisinopril (PRINIVIL,ZESTRIL) 40 MG tablet Take 1 tablet (40 mg total) by mouth daily.  . metFORMIN (GLUCOPHAGE) 1000 MG tablet Take 1,000 mg by mouth 2 (two) times daily with a meal.  . prasugrel (EFFIENT) 10 MG TABS tablet Take 10 mg by mouth daily.  . pravastatin (PRAVACHOL) 80 MG tablet Take 80 mg by mouth daily.  . sertraline (ZOLOFT) 100 MG tablet Take 100 mg by mouth daily.   No facility-administered encounter medications on file as of 03/30/2016.     Allergies (verified) Patient has no known allergies.   History: Past Medical History:    Diagnosis Date  . Arthritis    hands   . Colon polyps   . Coronary artery disease   . Depression   . Diabetes mellitus without complication (HCC)    insulin dependent  . High cholesterol   . Hypertension   . MI (myocardial infarction)   . Proteinuria   . Sleep apnea    uses cpap   Past Surgical History:  Procedure Laterality Date  . CARDIAC CATHETERIZATION N/A 07/16/2014   Procedure: Left Heart Cath and Coronary Angiography;  Surgeon: Adrian Prows, MD;  Location: [facility];  Service: Cardiovascular;  Laterality: N/A;  . CARDIAC CATHETERIZATION  07/16/2014   Procedure: Coronary Balloon Angioplasty;  Surgeon: Adrian Prows, MD;  Location: [facility];  Service: Cardiovascular;;  . CARDIAC SURGERY    . JOINT REPLACEMENT     Family History  Problem Relation Age of Onset  . Uterine cancer Mother   . Lung cancer Mother   . Hyperlipidemia Father   . Heart disease Father   . Hypertension Father   . Diabetes Father    Social History   Occupational History  . Not on file.   Social History Main Topics  . Smoking status: Never Smoker  . Smokeless tobacco: Former Systems developer  . Alcohol use Yes     Comment: OCC  . Drug use: No  .  Sexual activity: Not Currently   Tobacco Counseling Counseling given: Not Answered   Activities of Daily Living In your present state of health, do you have any difficulty performing the following activities: 03/30/2016 07/13/2015  Hearing? N N  Vision? N N  Difficulty concentrating or making decisions? N N  Walking or climbing stairs? N N  Dressing or bathing? N N  Doing errands, shopping? N N  Preparing Food and eating ? N -  Using the Toilet? N -  In the past six months, have you accidently leaked urine? N -  Do you have problems with loss of bowel control? N -  Managing your Medications? N -  Managing your Finances? N -  Housekeeping or managing your Housekeeping? N -  Some recent data might be hidden    Immunizations and Health  Maintenance Immunization History  Administered Date(s) Administered  . Influenza,inj,Quad PF,36+ Mos 03/15/2016  . Influenza-Unspecified 11/26/2014  . Pneumococcal-Unspecified 11/14/2013  . Tdap 02/25/2014   Health Maintenance Due  Topic Date Due  . HEMOGLOBIN A1C  01/11/2016    Patient Care Team: [name], MD as PCP - General (Family Medicine)  Indicate any recent Medical Services you may have received from other than [facility] providers in the past year (date may be approximate).    Assessment:   This is a routine wellness examination for [name]. The goal of the wellness visit is to assist the patient how to close the gaps in care and create a preventative care plan for the patient.   Osteoporosis risk reviewed.  Medications reviewed; taking without issues or barriers.  Safety issues reviewed; smoke detectors in the home. No firearms in the home. Wears seatbelts when driving or riding with others. Patient does wear sunscreen or protective clothing when in direct sunlight. No violence in the home.  Patient is alert, normal appearance, oriented to person/place/and time. Correctly identified the president of the [place], recall of 3/3 objects, and performing simple calculations.  Patient displays appropriate judgement and can read correct time from watch face.  No new identified risk were noted.  No failures at ADL's or IADL's.   BMI- discussed the importance of a healthy diet, water intake and exercise. Educational material provided.   HTN- followed by PCP.  Dental- every six months.  Sleep patterns- Sleeps 6-7 hours at night.  Wakes feeling rested. CPAP in use.  Hbg A1C- followed by [facility].  Patient Concerns: None at this time. Follow up with PCP as needed.  Hearing/Vision screen Hearing Screening Comments: Patient is able to hear conversational tones without difficulty.  No issues reported.   Vision Screening Comments: Followed by [facility] Wears corrective lenses Last OV 03/2015 Cataract extraction, L Visual acuity not assessed per patient preference since they have regular follow up with the ophthalmologist  Dietary issues and exercise activities discussed: Current Exercise Habits: The patient does not participate in regular exercise at present  Goals    . Increase physical activity      Depression Screen PHQ 2/9 Scores 03/30/2016  PHQ - 2 Score 0    Fall Risk Fall Risk  03/30/2016  Falls in the past year? No    Cognitive Function: MMSE - Mini Mental State Exam 03/30/2016  Orientation to time 5  Orientation to Place 5  Registration 3  Attention/ Calculation 5  Recall 3  Language- name 2 objects 2  Language- repeat 1  Language- follow 3 step command 3  Language- read &  follow direction 1  Write a sentence 1  Copy design 1  Total score 30        Screening Tests Health Maintenance  Topic Date Due  . HEMOGLOBIN A1C  01/11/2016  . OPHTHALMOLOGY EXAM  04/24/2016  . FOOT EXAM  12/29/2016  . COLONOSCOPY  05/18/2018  . PNEUMOCOCCAL POLYSACCHARIDE VACCINE (2) 11/15/2018  . TETANUS/TDAP  02/26/2024  . INFLUENZA VACCINE  Completed  . Hepatitis C Screening  Completed  . HIV Screening  Completed        Plan:   End of life planning; Advanced aging; Advanced directives discussed.  No HCPOA/Living Will.  Additional information declined at this time.  Medicare Attestation I have personally reviewed: The patient's medical and social history Their use of alcohol, tobacco or illicit drugs Their current medications and supplements The patient's functional ability including ADLs,fall risks, home safety risks, cognitive, and hearing and visual impairment Diet and physical activities Evidence for depression   The patient's weight, height, BMI, and visual acuity have been recorded in the chart.  I have made referrals and provided education to the patient based on review of the above and I have provided the  patient with a written personalized care plan for preventive services.    During the course of the visit Wilma was educated and counseled about the following appropriate screening and preventive services:   Vaccines to include Pneumoccal, Influenza, Hepatitis B, Td, Zostavax, HCV  Colorectal cancer screening-UTD  Diabetes- followed by [facility]  Glaucoma screening-quarterly eye exams  Nutrition counseling  Smoking cessation counseling  Patient Instructions (the written plan) were given to the patient.   Varney Biles, LPN   [id number]

## 2016-04-12 ENCOUNTER — Ambulatory Visit (INDEPENDENT_AMBULATORY_CARE_PROVIDER_SITE_OTHER): Payer: Medicare Other | Admitting: Pharmacist

## 2016-04-12 ENCOUNTER — Encounter: Payer: Self-pay | Admitting: Pharmacist

## 2016-04-12 DIAGNOSIS — E119 Type 2 diabetes mellitus without complications: Secondary | ICD-10-CM

## 2016-04-12 DIAGNOSIS — Z794 Long term (current) use of insulin: Secondary | ICD-10-CM

## 2016-04-12 MED ORDER — INSULIN REGULAR HUMAN (CONC) 500 UNIT/ML ~~LOC~~ SOPN: 90.0000 [IU] | PEN_INJECTOR | Freq: Two times a day (BID) | SUBCUTANEOUS | 0 refills | Status: DC

## 2016-04-12 NOTE — Assessment & Plan Note (Signed)
Diabetes longstanding diagnosed currently uncontrolled but with improved control since starting Jardiance. Patient reports hypoglycemic events and is able to verbalize appropriate hypoglycemia management plan. Patient reports adherence with medication. Control is suboptimal due to diet, sedentary lifestyle and insulin resistance. Following discussion and approval by Dr Caryl Bis, the following medication changes were made:  -Increase Humulin R U-500 to 100 units QAM and 90 units QPM. -Continue Jardiance 25 mg 1/2 tablet twice daily -Next A1C anticipated March 2018 at [place]. -Counseled on signs/symptoms/treatment of hypoglycemia

## 2016-04-12 NOTE — Progress Notes (Signed)
    S:    Chief Complaint  Patient presents with  . Medication Management    Diabetes  Patient arrives in good spirits ambulating without assistance.  Presents for diabetes evaluation, education, and management at the request of Dr Caryl Bis. Patient was referred on 02/16/2016 (telephone note).  Patient was last seen by Primary Care Provider on 08/28/15.  Today he reports he had headache and ringing in his ears after increasing Jardiance to 25 mg daily, which resolved after he self adjusted Jardiance to 12.5 mg BID. Also states his cardiologist stopped Lasix and [facility] in which [facility] will obtain his labs and A1C.    Patient denies adherence with medications.  Current diabetes medications include: metformin 1000 mg BID, Insulin U-500 95 units QAM and 85 units QPM.  Current hypertension medications include: carvedilol 6.25 mg BID, lisinopril 40 mg daily  Patient reports hypoglycemic events (3 events over past month). 2 hypoglycemic episodes occurred one night in which he states he did not eat enough carbohydrates for dinner.   Patient reported dietary habits: Reports he is still working on portion sizes  Patient reported exercise habits: Just working around the house.     Patient denies nocturia.  Denies burning/pain upon urination. Patient denies neuropathy. Patient denies visual changes. Patient reports self foot exams. Denies changes.   O:  Physical Exam  Vitals reviewed.  Review of Systems  Constitutional: Negative.    Lab Results  Component Value Date   HGBA1C 10.8 (H) 07/12/2015   Vitals:   04/12/16 1408  BP: 104/62  Pulse: 60   Home fasting CBG: 170, 290, 128, 215, 342, 195, 202, 296, 196, 139, 217, 248, 170, 210, 171, 227, 174, 283, 199, 66, 57, 179, 200, 255, 168 mg/dL Before Dinner CBG: 403, 456, 229, 118, 216, 278 189, 87, 105, 176, 107, 269, 71, 101, 167, 202, 212 mg/dL.  A/P: Diabetes longstanding diagnosed currently  uncontrolled but with improved control since starting Jardiance. Patient reports hypoglycemic events and is able to verbalize appropriate hypoglycemia management plan. Patient reports adherence with medication. Control is suboptimal due to diet, sedentary lifestyle and insulin resistance. Following discussion and approval by Dr Caryl Bis, the following medication changes were made:  -Increase Humulin R U-500 to 100 units QAM and 90 units QPM. -Continue Jardiance 25 mg 1/2 tablet twice daily -Next A1C anticipated March 2018 at [place]. -Counseled on signs/symptoms/treatment of hypoglycemia    ASCVD risk greater than 7.5%. Continued Aspirin 81 mg and Continued pravastatin 80 mg daily.   Written patient instructions provided.  Total time in face to face counseling 40 minutes.   Follow up in Pharmacist Clinic Visit in 6 weeks.    Patient was seen with Dr Caryl Bis today in clinic and medication changes were discussed and approved prior to initiation

## 2016-04-12 NOTE — Patient Instructions (Signed)
Thank you for coming in today  Increase U-500 to 100 units every morning and 90 units every evening Continue Jardiance 25 mg 1/2 tablet twice daily   [facility]   Continue to check blood glucose at least twice daily  Followup with Bennye Alm, PharmD in 6 weeks

## 2016-04-14 NOTE — Progress Notes (Signed)
I have reviewed the above note and agree. I additionally saw the patient in the office with the pharmacist.  Tommi Rumps, M.D.

## 2016-05-15 ENCOUNTER — Encounter (HOSPITAL_COMMUNITY): Payer: Self-pay | Admitting: *Deleted

## 2016-05-15 ENCOUNTER — Emergency Department (HOSPITAL_COMMUNITY)
Admission: EM | Admit: 2016-05-15 | Discharge: 2016-05-16 | Disposition: A | Discharge status: Home / Self Care | Payer: Non-veteran care | Attending: Emergency Medicine | Admitting: Emergency Medicine

## 2016-05-15 ENCOUNTER — Emergency Department (HOSPITAL_COMMUNITY): Payer: Non-veteran care

## 2016-05-15 DIAGNOSIS — Z7982 Long term (current) use of aspirin: Secondary | ICD-10-CM | POA: Diagnosis not present

## 2016-05-15 DIAGNOSIS — I252 Old myocardial infarction: Secondary | ICD-10-CM | POA: Insufficient documentation

## 2016-05-15 DIAGNOSIS — E1122 Type 2 diabetes mellitus with diabetic chronic kidney disease: Secondary | ICD-10-CM | POA: Insufficient documentation

## 2016-05-15 DIAGNOSIS — I2510 Atherosclerotic heart disease of native coronary artery without angina pectoris: Secondary | ICD-10-CM | POA: Insufficient documentation

## 2016-05-15 DIAGNOSIS — Z79899 Other long term (current) drug therapy: Secondary | ICD-10-CM | POA: Diagnosis not present

## 2016-05-15 DIAGNOSIS — N183 Chronic kidney disease, stage 3 (moderate): Secondary | ICD-10-CM | POA: Diagnosis not present

## 2016-05-15 DIAGNOSIS — Z794 Long term (current) use of insulin: Secondary | ICD-10-CM | POA: Insufficient documentation

## 2016-05-15 DIAGNOSIS — I130 Hypertensive heart and chronic kidney disease with heart failure and stage 1 through stage 4 chronic kidney disease, or unspecified chronic kidney disease: Secondary | ICD-10-CM | POA: Insufficient documentation

## 2016-05-15 DIAGNOSIS — R42 Dizziness and giddiness: Secondary | ICD-10-CM | POA: Diagnosis not present

## 2016-05-15 DIAGNOSIS — I509 Heart failure, unspecified: Secondary | ICD-10-CM | POA: Diagnosis not present

## 2016-05-15 LAB — BASIC METABOLIC PANEL
Anion gap: 12 (ref 5–15)
BUN: 44 mg/dL — ABNORMAL HIGH (ref 6–20)
CO2: 24 mmol/L (ref 22–32)
Calcium: 9.6 mg/dL (ref 8.9–10.3)
Chloride: 101 mmol/L (ref 101–111)
Creatinine, Ser: 2.04 mg/dL — ABNORMAL HIGH (ref 0.61–1.24)
GFR calc Af Amer: 39 mL/min — ABNORMAL LOW (ref >60)
GFR calc non Af Amer: 34 mL/min — ABNORMAL LOW (ref >60)
Glucose, Bld: 68 mg/dL (ref 65–99)
Potassium: 4.5 mmol/L (ref 3.5–5.1)
Sodium: 137 mmol/L (ref 135–145)

## 2016-05-15 LAB — CBC
HCT: 43 % (ref 39.0–52.0)
Hemoglobin: 13.9 g/dL (ref 13.0–17.0)
MCH: 29.7 pg (ref 26.0–34.0)
MCHC: 32.3 g/dL (ref 30.0–36.0)
MCV: 91.9 fL (ref 78.0–100.0)
Platelets: 203 10*3/uL (ref 150–400)
RBC: 4.68 MIL/uL (ref 4.22–5.81)
RDW: 14.1 % (ref 11.5–15.5)
WBC: 9.9 10*3/uL (ref 4.0–10.5)

## 2016-05-15 MED ORDER — SODIUM CHLORIDE 0.9 % IV BOLUS (SEPSIS): 1000.0000 mL | Freq: Once | INTRAVENOUS | Status: DC

## 2016-05-15 MED ORDER — MECLIZINE HCL 25 MG PO TABS
25.0000 mg | ORAL_TABLET | Freq: Once | ORAL | Status: AC
  Administered 2016-05-15: 25 mg via ORAL
  Filled 2016-05-15: qty 1

## 2016-05-15 MED ORDER — MECLIZINE HCL 25 MG PO TABS: 25.0000 mg | ORAL_TABLET | Freq: Once | ORAL | Status: DC

## 2016-05-15 MED ORDER — LORAZEPAM 2 MG/ML IJ SOLN
2.0000 mg | Freq: Once | INTRAMUSCULAR | Status: AC
  Administered 2016-05-15: 2 mg via INTRAVENOUS
  Filled 2016-05-15: qty 1

## 2016-05-15 MED ORDER — SODIUM CHLORIDE 0.9 % IV BOLUS (SEPSIS)
250.0000 mL | Freq: Once | INTRAVENOUS | Status: AC
Start: 2016-05-15 — End: 2016-05-15
  Administered 2016-05-15: 250 mL via INTRAVENOUS

## 2016-05-15 MED ORDER — LORAZEPAM 2 MG/ML IJ SOLN: 1.0000 mg | Freq: Once | INTRAMUSCULAR | Status: DC

## 2016-05-15 MED ORDER — LORAZEPAM 2 MG/ML IJ SOLN: 2.0000 mg | Freq: Once | INTRAMUSCULAR | Status: DC

## 2016-05-15 NOTE — ED Triage Notes (Signed)
Pt reports waking up this am with dizziness, hx of vertigo. Reports n/v x 2 hours.

## 2016-05-15 NOTE — ED Provider Notes (Signed)
[facility] DEPT Provider Note   CSN: [id number] Arrival date & time: 05/15/16  1727     History   Chief Complaint Chief Complaint  Patient presents with  . Dizziness  . Emesis    HPI Martin Bush is a 61 y.o. male.  HPI    61 yo M with h/o HTN, HLD, DM2, known vertigo here with acute onset dizziness. Pt states his sx started when he woke up and turned his head today, causing sensation of spinning. He had associated nausea and vomiting. Throughout the day, he has had persistent sensation of room spinning any time he moves his head, particularly to the right. He then experiences a severe room spinning sensation with nausea and vomiting. He feels better when he is nto moving his head or changing positions. He has no associated weakness, numbness, or difficulty speaking or swallowing. No headache.  Past Medical History:  Diagnosis Date  . Arthritis    hands   . Colon polyps   . Coronary artery disease   . Depression   . Diabetes mellitus without complication (HCC)    insulin dependent  . High cholesterol   . Hypertension   . MI (myocardial infarction) ([place])   . Proteinuria   . Sleep apnea    uses cpap    Patient Active Problem List   Diagnosis Date Noted  . CHF (congestive heart failure) ([place]) 07/23/2015  . Hyperlipidemia 07/11/2015  . Essential hypertension 07/11/2015  . CAD (coronary artery disease) 07/11/2015  . Depression 07/11/2015  . CKD (chronic kidney disease), stage III 07/11/2015  . AKI (acute kidney injury) ([place]) 07/11/2015  . OSA on CPAP 07/11/2015  . Lightheadedness 07/11/2015  . Carotid artery stenosis   . S/P eye surgery 05/15/2015  . Injury of left toe 04/27/2015  . Annual physical exam 02/28/2015  . Chronic low back pain 02/13/2015  . Diabetes ([place]) 02/13/2015  . Dizziness 02/05/2015  . Chest pain 07/16/2014  . NSTEMI (non-ST elevated myocardial infarction) ([place]) 07/16/2014    Past Surgical History:  Procedure Laterality Date  .  CARDIAC CATHETERIZATION N/A 07/16/2014   Procedure: Left Heart Cath and Coronary Angiography;  Surgeon: Adrian Prows, MD;  Location: [facility];  Service: Cardiovascular;  Laterality: N/A;  . CARDIAC CATHETERIZATION  07/16/2014   Procedure: Coronary Balloon Angioplasty;  Surgeon: Adrian Prows, MD;  Location: [facility];  Service: Cardiovascular;;  . CARDIAC SURGERY    . JOINT REPLACEMENT         Home Medications    Prior to Admission medications   Medication Sig Start Date End Date Taking? Authorizing Provider  amiodarone (PACERONE) 200 MG tablet Take 200 mg by mouth daily.  11/19/14  Yes Historical Provider, MD  aspirin EC 81 MG tablet Take 81 mg by mouth daily.   Yes Historical Provider, MD  carvedilol (COREG) 12.5 MG tablet Take 6.25 mg by mouth 2 (two) times daily with a meal.   Yes Historical Provider, MD  cetirizine (ZYRTEC) 10 MG tablet Take 10 mg by mouth daily.   Yes Historical Provider, MD  Cholecalciferol (VITAMIN D) 2000 units tablet Take 2,000 Units by mouth 2 (two) times daily.   Yes Historical Provider, MD  empagliflozin (JARDIANCE) 25 MG TABS tablet Take 25 mg by mouth daily. Patient taking differently: Take 12.5 mg by mouth 2 (two) times daily.  03/15/16  Yes [name], MD  insulin regular human CONCENTRATED (HUMULIN R U-500 KWIKPEN) 500 UNIT/ML kwikpen Inject 90-100 Units into  the skin 2 (two) times daily with a meal. Inject 100 units in the morning and 90 in the evening Patient taking differently: Inject 80-90 Units into the skin 2 (two) times daily with a meal. Inject 90 units subcutaneously every morning and 80 units at night 04/12/16  Yes [name], MD  Omega-3 Fatty Acids (FISH OIL) 1000 MG CAPS Take 2,000 mg by mouth 3 (three) times daily.   Yes Historical Provider, MD  prasugrel (EFFIENT) 10 MG TABS tablet Take 10 mg by mouth daily.   Yes Historical Provider, MD  pravastatin (PRAVACHOL) 80 MG tablet Take 80 mg by mouth at bedtime.    Yes  Historical Provider, MD  PRESCRIPTION MEDICATION Inhale into the lungs at bedtime. CPAP   Yes Historical Provider, MD  sertraline (ZOLOFT) 100 MG tablet Take 50 mg by mouth daily.    Yes Historical Provider, MD  diazepam (VALIUM) 5 MG tablet Take 1 tablet (5 mg total) by mouth every 12 (twelve) hours as needed (vertigo/dizziness). 05/16/16   Duffy Bruce, MD  fluticasone (FLONASE) 50 MCG/ACT nasal spray Place 2 sprays into both nostrils daily. 05/16/16 06/15/16  Duffy Bruce, MD  furosemide (LASIX) 40 MG tablet Take 1 tablet (40 mg total) by mouth daily. Please resume in  days, 07/15/2015 Patient not taking: Reported on 04/12/2016 07/15/15   Silver Huguenin Elgergawy, MD  lisinopril (PRINIVIL,ZESTRIL) 40 MG tablet Take 1 tablet (40 mg total) by mouth daily. Patient not taking: Reported on 05/15/2016 07/13/15   Albertine Patricia, MD  meclizine (ANTIVERT) 25 MG tablet Take 1 tablet (25 mg total) by mouth 3 (three) times daily as needed for dizziness. 05/16/16   Duffy Bruce, MD    Family History Family History  Problem Relation Age of Onset  . Uterine cancer Mother   . Lung cancer Mother   . Hyperlipidemia Father   . Heart disease Father   . Hypertension Father   . Diabetes Father     Social History Social History  Substance Use Topics  . Smoking status: Never Smoker  . Smokeless tobacco: Former Systems developer  . Alcohol use Yes     Comment: [place]     Allergies   Patient has no known allergies.   Review of Systems Review of Systems  Constitutional: Negative for chills, fatigue and fever.  HENT: Negative for congestion and rhinorrhea.   Eyes: Negative for visual disturbance.  Respiratory: Negative for cough, shortness of breath and wheezing.   Cardiovascular: Negative for chest pain and leg swelling.  Gastrointestinal: Positive for nausea and vomiting. Negative for abdominal pain and diarrhea.  Genitourinary: Negative for dysuria and flank pain.  Musculoskeletal: Negative for neck pain and neck  stiffness.  Skin: Negative for rash and wound.  Allergic/Immunologic: Negative for immunocompromised state.  Neurological: Positive for dizziness. Negative for syncope, weakness and headaches.  All other systems reviewed and are negative.    Physical Exam Updated Vital Signs BP 122/65   Pulse (!) 59   Temp 97.8 F (36.6 C) (Oral)   Resp 18   SpO2 97%   Physical Exam  Constitutional: He is oriented to person, place, and time. He appears well-developed and well-nourished. No distress.  HENT:  Head: Normocephalic and atraumatic.  Mouth/Throat: Oropharynx is clear and moist.  Significant serous effusions bilaterally. Nasal mucosal edema noted.  Eyes: Conjunctivae are normal.  Neck: Neck supple.  Cardiovascular: Normal rate, regular rhythm and normal heart sounds.  Exam reveals no friction rub.   No murmur heard.  Pulmonary/Chest: Effort normal and breath sounds normal. No respiratory distress. He has no wheezes. He has no rales.  Abdominal: He exhibits no distension.  Musculoskeletal: He exhibits no edema.  Neurological: He is alert and oriented to person, place, and time. He exhibits normal muscle tone.  Skin: Skin is warm. Capillary refill takes less than 2 seconds.  Psychiatric: He has a normal mood and affect.  Nursing note and vitals reviewed.   Neurological Exam:  Mental Status: Alert and oriented to person, place, and time. Attention and concentration normal. Speech clear. Recent memory is intact. Cranial Nerves: Visual fields grossly intact. EOMI and PERRLA. No nystagmus noted when head not moving. Right-beating nystgamus noted with movement of head to right or left, with + latency and fatiguability. Facial sensation intact at forehead, maxillary cheek, and chin/mandible bilaterally. No facial asymmetry or weakness. Hearing grossly normal. Uvula is midline, and palate elevates symmetrically. Normal SCM and trapezius strength. Tongue midline without fasciculations. Motor:  Muscle strength 5/5 in proximal and distal UE and LE bilaterally. No pronator drift. Muscle tone normal. Reflexes: 2+ and symmetrical in all four extremities.  Sensation: Intact to light touch in upper and lower extremities distally bilaterally.  Gait: Normal without ataxia. Coordination: Normal FTN bilaterally.    ED Treatments / Results  Labs (all labs ordered are listed, but only abnormal results are displayed) Labs Reviewed  BASIC METABOLIC PANEL - Abnormal; Notable for the following:       Result Value   BUN 44 (*)    Creatinine, Ser 2.04 (*)    GFR calc non Af Amer 34 (*)    GFR calc Af Amer 39 (*)    All other components within normal limits  CBC  CBG MONITORING, ED    EKG  EKG Interpretation None       Radiology Ct Head Wo Contrast  Result Date: 05/15/2016 CLINICAL DATA:  Acute onset of dizziness and vertigo. Initial encounter. EXAM: CT HEAD WITHOUT CONTRAST TECHNIQUE: Contiguous axial images were obtained from the base of the skull through the vertex without intravenous contrast. COMPARISON:  CT of the head performed 07/11/2015 FINDINGS: Brain: No evidence of acute infarction, hemorrhage, hydrocephalus, extra-axial collection or mass lesion/mass effect. Prominence of the ventricles and sulci suggest mild cortical volume loss. The brainstem and fourth ventricle are within normal limits. The basal ganglia are unremarkable in appearance. The cerebral hemispheres demonstrate grossly normal gray-white differentiation. No mass effect or midline shift is seen. Vascular: No hyperdense vessel or unexpected calcification. Skull: There is no evidence of fracture; visualized osseous structures are unremarkable in appearance. Sinuses/Orbits: The orbits are within normal limits. The paranasal sinuses and mastoid air cells are well-aerated. Other: No significant soft tissue abnormalities are seen. IMPRESSION: 1. No acute intracranial pathology seen on CT. 2. Mild cortical volume loss  noted. Electronically Signed   By: Garald Balding M.D.   On: 05/15/2016 23:34    Procedures Procedures (including critical care time)  Medications Ordered in ED Medications  sodium chloride 0.9 % bolus 250 mL (0 mLs Intravenous Stopped 05/15/16 2217)  LORazepam (ATIVAN) injection 2 mg (2 mg Intravenous Given 05/15/16 2117)  meclizine (ANTIVERT) tablet 25 mg (25 mg Oral Given 05/15/16 2347)     Initial Impression / Assessment and Plan / ED Course  I have reviewed the triage vital signs and the nursing notes.  Pertinent labs & imaging results that were available during my care of the patient were reviewed by me and considered in my medical  decision making (see chart for details).     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 Pt has unidirectional, horizontal nystagmus that is reproduced on Dix-hallpike testing with latency. His sx are markedly improved after Epley maneuver and with ativan, and he is ambulatory without difficulty. CT head negative. Lab work is reassuring. Pt does have known carotid disease but given absence of other neuro sx, low suspcion for symptomatic carotid disease. I discussed with Dr. Nicole Kindred of neurology, who feels further work-up is not indicated as pt cannot obtain MRI and sx are consistent with peripheral etiology, likely 2/2 ETD and middle ear effusions from allergies. He has no dysarthria, dyspahgia, ataxia, or other signs of cerebellar or posterior circulation stroke and all of hsi sx are resolved when not moving his head, c/w peripheral etiology. Will d/c with meclizine, valium, flonase, and outpt follow-up.  Final Clinical Impressions(s) / ED Diagnoses   Final diagnoses:  Vertigo    New Prescriptions Discharge Medication List as of 05/16/2016 12:36 AM    START taking these medications   Details  diazepam (VALIUM) 5 MG tablet Take 1 tablet (5 mg total) by mouth every 12 (twelve) hours as needed (vertigo/dizziness)., Starting  Sun 05/16/2016, Print    fluticasone (FLONASE) 50 MCG/ACT nasal spray Place 2 sprays into both nostrils daily., Starting Sun 05/16/2016, Until Tue 06/15/2016, Print    meclizine (ANTIVERT) 25 MG tablet Take 1 tablet (25 mg total) by mouth 3 (three) times daily as needed for dizziness., Starting Sun 05/16/2016, Print         Duffy Bruce, MD 05/16/16 [id number]

## 2016-05-16 LAB — CBG MONITORING, ED: Glucose-Capillary: 78 mg/dL (ref 65–99)

## 2016-05-16 MED ORDER — FLUTICASONE PROPIONATE 50 MCG/ACT NA SUSP: 2.0000 | Freq: Every day | NASAL | 0 refills | Status: DC

## 2016-05-16 MED ORDER — DIAZEPAM 5 MG PO TABS: 5.0000 mg | ORAL_TABLET | Freq: Two times a day (BID) | ORAL | 0 refills | Status: DC | PRN

## 2016-05-16 MED ORDER — MECLIZINE HCL 25 MG PO TABS: 25.0000 mg | ORAL_TABLET | Freq: Three times a day (TID) | ORAL | 0 refills | Status: DC | PRN

## 2016-05-16 NOTE — ED Notes (Signed)
Pt ambulated x1 person assist in the hall. Pt has no complaints and has steady gait.

## 2016-05-24 ENCOUNTER — Telehealth: Payer: Self-pay | Admitting: *Deleted

## 2016-05-24 ENCOUNTER — Encounter: Payer: Self-pay | Admitting: Pharmacist

## 2016-05-24 ENCOUNTER — Ambulatory Visit (INDEPENDENT_AMBULATORY_CARE_PROVIDER_SITE_OTHER): Payer: Medicare Other | Admitting: Pharmacist

## 2016-05-24 DIAGNOSIS — E785 Hyperlipidemia, unspecified: Secondary | ICD-10-CM

## 2016-05-24 DIAGNOSIS — Z794 Long term (current) use of insulin: Secondary | ICD-10-CM | POA: Diagnosis not present

## 2016-05-24 DIAGNOSIS — I10 Essential (primary) hypertension: Secondary | ICD-10-CM | POA: Diagnosis not present

## 2016-05-24 DIAGNOSIS — E119 Type 2 diabetes mellitus without complications: Secondary | ICD-10-CM | POA: Diagnosis not present

## 2016-05-24 NOTE — Progress Notes (Signed)
S:    Chief Complaint  Patient presents with  . Medication Management    Diabetes   Patient arrives in good spirits ambulating without assistance accompanied by his wife.  Presents for diabetes evaluation, education, and management at the request of Dr Caryl Bis. Patient was referred on 02/16/16 (telephone note).  Patient was last seen by Primary Care Provider on 08/28/15 and last seen in pharmacy clinic on 04/12/16.  Patient presented to [facility] on [facility]/15/16 with vertigo and was prescribed PRN diazepam, fluticasone nasal spray and meclizine.  Today patient reports his vertigo has since resolved but states he was told his inner ear was inflammed during this visit.  He also reports he was seen on 3/28 by his PCP at the [facility] and his lisinopril and metformin were stopped due to renal insufficiency.  He has followup with the endocrinologist this Thursday at the [place].  He reports his last A1C at the [place] was 9%.  He states since stopping the metformin his CBGs have increased.  His wife has concerns about "memory loss and him doing random things."    Patient reports adherence with medications.  Current diabetes medications include: Insulin U 500 100 units QAM and 90 units QPM, Jardiance 25 mg daily Current hypertension medications include: carvedilol 12.5 mg BID, isosorbide 30 mg daily, hydralazine 25 mg BID  Patient denies hypoglycemic events. Reports proper treatment knowledge  Patient reported dietary habits: Recently went to nutrition class.  Wife reports they eat out a lot.    Patient reported exercise habits: limited    Patient denies nocturia.  Denies pain/burning upon urination  Patient denies neuropathy. Patient denies visual changes. Patient reports self foot exams. Denies changes.     O:  Physical Exam  Vitals reviewed.  Review of Systems  Constitutional: Negative.    Lab Results  Component Value Date   HGBA1C 10.8 (H) 07/12/2015   Vitals:   05/24/16 1400  BP: 112/71  Pulse: 70      Home fasting CBG: 280, 311, 220, 215, 287, 254, 253, 188, 148, 274, 235, 313, 390, 211, 307, 173, 246, 197, 252  2 hour post-prandial/random CBG: 271, 400, 300, 91, 269, 311.  A/P: Diabetes longstanding diagnosed currently uncontrolled. Patient reports hypoglycemic events and is able to verbalize appropriate hypoglycemia management plan. Patient reports adherence with medication. Control is suboptimal due [facility] due [facility], insulin resistance, and diet. Following discussion and approval by Dr Caryl Bis, the following medication changes were made:  -Continue Insulin U-500 100 units QAM and 90 units QPM.   -Will await [place] endocrinology input pending appointment this Thursday as patient potentially candidate for TID U-500 regimen.  -Instructed patient to followup with [place] endocrinologist about GLP1 agonist.  -Continued Jardiance (empagliflozin) 12.5 mg BID.  Consider stopping Jardiance pending SCr and eGFR as metformin was held by the [place] at last visit in March.   -Will obtain BMET and A1C today  ASCVD risk greater than 7.5%. Continued Aspirin 81 mg and Continued pravastatin 80 mg daily.   Hypertension longstanding diagnosed currently controlled.  Patient reports adherence with medication. Lisinopril was held due to renal insufficiency at last visit in march.  Continue current medications  Vertigo - Patient examined by Dr Caryl Bis today in clinic.  Instructed to use fluticasone nasal spray to reduce inflammation.  Memory Loss - Patient will schedule followup with Dr Caryl Bis    Written patient instructions provided.  Total time in face to face  counseling 45 minutes.   Follow up with Dr Caryl Bis within 1 month.     Patient was seen with Dr Caryl Bis today in clinic and medication changes were discussed and approved prior to initiation

## 2016-05-24 NOTE — Assessment & Plan Note (Signed)
ASCVD risk greater than 7.5%. Continued Aspirin 81 mg and Continued pravastatin 80 mg daily.

## 2016-05-24 NOTE — Assessment & Plan Note (Signed)
Diabetes longstanding diagnosed currently uncontrolled. Patient reports hypoglycemic events and is able to verbalize appropriate hypoglycemia management plan. Patient reports adherence with medication. Control is suboptimal due to [facility] due to [facility] insufficiency, insulin resistance, and diet. Following discussion and approval by Dr Caryl Bis, the following medication changes were made:  -Continue Insulin U-500 100 units QAM and 90 units QPM.   -Will await [place] endocrinology input pending appointment this Thursday as patient potentially candidate for TID U-500 regimen.  -Instructed patient to followup with [place] endocrinologist about GLP1 agonist.  -Continued Jardiance (empagliflozin) 12.5 mg BID.  Consider stopping Jardiance pending SCr and eGFR as metformin was held by the [place] at last visit in March.   -Will obtain BMET and A1C today

## 2016-05-24 NOTE — Telephone Encounter (Signed)
Patient was advised by Rosalio Loud to see Dr. Caryl Bis in one month , pt was scheduled for 06/08, if this is to far out, please advise a time and date

## 2016-05-24 NOTE — Patient Instructions (Addendum)
Thank you for coming in today  Continue current doses of insulin for now until you see endocrinology this week  We will get lab work today and let you know about the Jardiance  Followup with Dr Caryl Bis in 1 month or before if available

## 2016-05-24 NOTE — Assessment & Plan Note (Signed)
Hypertension longstanding diagnosed currently controlled.  Patient reports adherence with medication. Lisinopril was held due to renal insufficiency at last visit in march.  Continue current medications

## 2016-05-24 NOTE — Telephone Encounter (Signed)
If patient would like you can schedule Wednesday at 3 for 30 minutes

## 2016-05-25 ENCOUNTER — Other Ambulatory Visit: Payer: Non-veteran care

## 2016-05-25 NOTE — Progress Notes (Signed)
I have reviewed the above note and agree. Patient seen in clinic with the pharmacist. Discussed vertigo. Vertigo has resolved. Bilateral TMs do appear slightly erythematous. No purulent fluid behind the TMs. Discussed using Flonase and monitoring for recurrence. We'll see in follow-up within the next month or so for his memory issues.  Tommi Rumps, M.D.

## 2016-05-25 NOTE — Addendum Note (Signed)
Addended by: Leeanne Rio on: 05/25/2016 12:55 PM   Modules accepted: Orders

## 2016-05-26 ENCOUNTER — Other Ambulatory Visit (INDEPENDENT_AMBULATORY_CARE_PROVIDER_SITE_OTHER): Payer: Medicare Other

## 2016-05-26 DIAGNOSIS — Z794 Long term (current) use of insulin: Secondary | ICD-10-CM | POA: Diagnosis not present

## 2016-05-26 DIAGNOSIS — E119 Type 2 diabetes mellitus without complications: Secondary | ICD-10-CM

## 2016-05-26 LAB — BASIC METABOLIC PANEL
BUN: 40 mg/dL — ABNORMAL HIGH (ref 6–23)
CO2: 26 mEq/L (ref 19–32)
Calcium: 10 mg/dL (ref 8.4–10.5)
Chloride: 99 mEq/L (ref 96–112)
Creatinine, Ser: 1.68 mg/dL — ABNORMAL HIGH (ref 0.40–1.50)
GFR: 44.46 mL/min — ABNORMAL LOW (ref >60.00)
Glucose, Bld: 346 mg/dL — ABNORMAL HIGH (ref 70–99)
Potassium: 4.4 mEq/L (ref 3.5–5.1)
Sodium: 133 mEq/L — ABNORMAL LOW (ref 135–145)

## 2016-05-26 LAB — HEMOGLOBIN A1C: Hgb A1c MFr Bld: 9.3 % — ABNORMAL HIGH (ref 4.6–6.5)

## 2016-05-26 NOTE — Addendum Note (Signed)
Addended by: Marchia Bond on: 05/26/2016 11:36 AM   Modules accepted: Orders

## 2016-05-27 ENCOUNTER — Telehealth: Payer: Self-pay

## 2016-05-27 NOTE — Telephone Encounter (Signed)
Left message to return call 

## 2016-05-27 NOTE — Telephone Encounter (Signed)
Patient notifed

## 2016-05-27 NOTE — Telephone Encounter (Signed)
-----   Message from [name], MD sent at 05/27/2016 12:03 PM EDT ----- Please let the patient know that his kidney function is improved from the last check in our system though is not at a level where we should be using Jardiance. Please have him discontinue the Jardiance. He needs to see the endocrinologist at the [facility] as scheduled. His A1c is minimally improved at 9.3. Thanks.

## 2016-07-02 ENCOUNTER — Ambulatory Visit: Payer: Non-veteran care | Admitting: Family Medicine

## 2016-07-02 ENCOUNTER — Telehealth: Payer: Self-pay | Admitting: Family Medicine

## 2016-07-02 NOTE — Telephone Encounter (Signed)
Please advise 

## 2016-07-02 NOTE — Telephone Encounter (Signed)
FYI - Pt spouse called and stated that they had something come up that they cannot control. Please advise, thank you!

## 2016-07-02 NOTE — Telephone Encounter (Signed)
Can you please find out what this means. Does this mean he is not coming to the visit? If so you can remove him from the schedule. Thanks.

## 2016-07-02 NOTE — Telephone Encounter (Signed)
Please charge no show

## 2016-07-06 DIAGNOSIS — I472 Ventricular tachycardia: Secondary | ICD-10-CM | POA: Diagnosis not present

## 2016-07-07 DIAGNOSIS — I472 Ventricular tachycardia: Secondary | ICD-10-CM | POA: Diagnosis not present

## 2016-08-04 ENCOUNTER — Ambulatory Visit (INDEPENDENT_AMBULATORY_CARE_PROVIDER_SITE_OTHER): Payer: Medicare Other | Admitting: Family Medicine

## 2016-08-04 ENCOUNTER — Encounter: Payer: Self-pay | Admitting: Family Medicine

## 2016-08-04 DIAGNOSIS — G4733 Obstructive sleep apnea (adult) (pediatric): Secondary | ICD-10-CM | POA: Diagnosis not present

## 2016-08-04 DIAGNOSIS — E119 Type 2 diabetes mellitus without complications: Secondary | ICD-10-CM

## 2016-08-04 DIAGNOSIS — Z794 Long term (current) use of insulin: Secondary | ICD-10-CM

## 2016-08-04 DIAGNOSIS — I10 Essential (primary) hypertension: Secondary | ICD-10-CM | POA: Diagnosis not present

## 2016-08-04 DIAGNOSIS — Z9989 Dependence on other enabling machines and devices: Secondary | ICD-10-CM

## 2016-08-04 NOTE — Assessment & Plan Note (Signed)
Has been changed to Victoza. Also on insulin. He will continue to monitor sugars. He is due for an A1c in about a month and he should have this done at the [place] when he follows up with them.

## 2016-08-04 NOTE — Assessment & Plan Note (Signed)
Doing well on CPAP. Continue CPAP.

## 2016-08-04 NOTE — Progress Notes (Signed)
  Tommi Rumps, MD Phone: [phone]  Martin Bush is a 61 y.o. male who presents today for follow-up.  DIABETES Disease Monitoring: Blood Sugar ranges-high 100s-low 200s Polyuria/phagia/dipsia- no      optho- just saw them at the [place], had shot in left eye for swelling related to DM Medications: Compliance- taking 100 units of insulin in the morning and 90 units at night. Also on Victoza. Hypoglycemic symptoms- rarely gets symptoms. He'll get lightheaded and sweaty. Sugars will be in the 60s. He'll eat something and it goes away.  Hypertension: Notes typically normal at home. Taking his medications. No chest pain or edema. He does note some dyspnea since he went on diltiazem. Notes he discontinued the diltiazem earlier this week when he developed a rash and he was advised by a physician at the [facility] to discontinue it. They placed him on a cream for the rash. The rash is improving to some degree. The dyspnea is also improving since coming off of the diltiazem. He notes he'll get short of breath with activity at times. No shortness of breath with rest. No cough or congestion or wheezing. He notes no PND or orthopnea. He's had no weight gain. He is no longer on Lasix.  OSA: Using a CPAP nightly. Using for 7-8 hours a night. He does wake well rested. No daytime sleepiness.   PMH: nonsmoker.   ROS see history of present illness  Objective  Physical Exam Vitals:   08/04/16 0915  BP: 118/80  Pulse: 74  Temp: 98.3 F (36.8 C)    BP Readings from Last 3 Encounters:  08/04/16 118/80  05/24/16 112/71  05/16/16 122/65   Wt Readings from Last 3 Encounters:  08/04/16 261 lb 6.4 oz (118.6 kg)  04/12/16 259 lb (117.5 kg)  03/30/16 257 lb 1.9 oz (116.6 kg)    Physical Exam  Constitutional: No distress.  Cardiovascular: Normal rate, regular rhythm and normal heart sounds.   Pulmonary/Chest: Effort normal and breath sounds normal.  Musculoskeletal: He exhibits no edema.  Neurological:  He is alert. Gait normal.  Skin: Skin is warm and dry. He is not diaphoretic.  Scattered patches of erythematous papular rash on his arms that have been excoriated, similar patch over his right flank, none of these are tender      Assessment/Plan: Please see individual problem list.  Essential hypertension At goal. He'll continue his current medications. He has noted some dyspnea since having gone on diltiazem. Has improved somewhat after coming off diltiazem. Could be related to this medication. Discussed that it could be related to his other cardiac issues. No dyspnea currently. Exam normal. Oxygen typically in the low to mid 90s. Discussed obtaining an EKG and lab work today though he deferred to have this done at his [place] office next week when he sees them. I discussed that if he has any worsening or any change in the symptoms other than improving he needs to be evaluated immediately. He voiced understanding.  Diabetes ([place]) Has been changed to Victoza. Also on insulin. He will continue to monitor sugars. He is due for an A1c in about a month and he should have this done at the [place] when he follows up with them.  OSA on CPAP Doing well on CPAP. Continue CPAP.  Tommi Rumps, MD [place]

## 2016-08-04 NOTE — Patient Instructions (Signed)
Nice to see you. Please monitor your breathing. If it worsens or you develop chest pain please get looked at immediately. Please continue your CPAP. Please continue to monitor your blood sugar.

## 2016-08-04 NOTE — Assessment & Plan Note (Addendum)
At goal. He'll continue his current medications. He has noted some dyspnea since having gone on diltiazem. Has improved somewhat after coming off diltiazem. Could be related to this medication. Discussed that it could be related to his other cardiac issues. No dyspnea currently. Exam normal. Oxygen typically in the low to mid 90s. Discussed obtaining an EKG and lab work today though he deferred to have this done at his [place] office next week when he sees them. I discussed that if he has any worsening or any change in the symptoms other than improving he needs to be evaluated immediately. He voiced understanding.

## 2016-08-09 DIAGNOSIS — I6523 Occlusion and stenosis of bilateral carotid arteries: Secondary | ICD-10-CM | POA: Diagnosis not present

## 2016-08-09 DIAGNOSIS — I10 Essential (primary) hypertension: Secondary | ICD-10-CM | POA: Diagnosis not present

## 2016-08-09 DIAGNOSIS — I255 Ischemic cardiomyopathy: Secondary | ICD-10-CM | POA: Diagnosis not present

## 2016-08-09 DIAGNOSIS — I25810 Atherosclerosis of coronary artery bypass graft(s) without angina pectoris: Secondary | ICD-10-CM | POA: Diagnosis not present

## 2016-08-09 DIAGNOSIS — E1165 Type 2 diabetes mellitus with hyperglycemia: Secondary | ICD-10-CM | POA: Diagnosis not present

## 2016-08-09 DIAGNOSIS — E782 Mixed hyperlipidemia: Secondary | ICD-10-CM | POA: Diagnosis not present

## 2016-08-09 DIAGNOSIS — I5042 Chronic combined systolic (congestive) and diastolic (congestive) heart failure: Secondary | ICD-10-CM | POA: Diagnosis not present

## 2016-08-09 DIAGNOSIS — I2510 Atherosclerotic heart disease of native coronary artery without angina pectoris: Secondary | ICD-10-CM | POA: Diagnosis not present

## 2016-08-23 DIAGNOSIS — I10 Essential (primary) hypertension: Secondary | ICD-10-CM | POA: Diagnosis not present

## 2016-08-31 DIAGNOSIS — I6523 Occlusion and stenosis of bilateral carotid arteries: Secondary | ICD-10-CM | POA: Diagnosis not present

## 2016-08-31 DIAGNOSIS — I255 Ischemic cardiomyopathy: Secondary | ICD-10-CM | POA: Diagnosis not present

## 2016-09-02 DIAGNOSIS — I255 Ischemic cardiomyopathy: Secondary | ICD-10-CM | POA: Diagnosis not present

## 2016-09-02 DIAGNOSIS — I25810 Atherosclerosis of coronary artery bypass graft(s) without angina pectoris: Secondary | ICD-10-CM | POA: Diagnosis not present

## 2016-09-02 DIAGNOSIS — I2510 Atherosclerotic heart disease of native coronary artery without angina pectoris: Secondary | ICD-10-CM | POA: Diagnosis not present

## 2016-09-02 DIAGNOSIS — I5042 Chronic combined systolic (congestive) and diastolic (congestive) heart failure: Secondary | ICD-10-CM | POA: Diagnosis not present

## 2016-09-07 ENCOUNTER — Other Ambulatory Visit: Payer: Self-pay | Admitting: Cardiology

## 2016-09-07 DIAGNOSIS — I6523 Occlusion and stenosis of bilateral carotid arteries: Secondary | ICD-10-CM

## 2016-09-10 ENCOUNTER — Ambulatory Visit
Admission: RE | Admit: 2016-09-10 | Discharge: 2016-09-10 | Disposition: A | Discharge status: Home / Self Care | Payer: Non-veteran care | Source: Ambulatory Visit | Attending: Cardiology | Admitting: Cardiology

## 2016-09-10 DIAGNOSIS — I6523 Occlusion and stenosis of bilateral carotid arteries: Secondary | ICD-10-CM

## 2016-09-10 MED ORDER — IOPAMIDOL (ISOVUE-370) INJECTION 76%
60.0000 mL | Freq: Once | INTRAVENOUS | Status: AC | PRN
  Administered 2016-09-10: 60 mL via INTRAVENOUS

## 2016-11-02 DIAGNOSIS — Z95810 Presence of automatic (implantable) cardiac defibrillator: Secondary | ICD-10-CM | POA: Diagnosis not present

## 2016-11-02 DIAGNOSIS — Z4502 Encounter for adjustment and management of automatic implantable cardiac defibrillator: Secondary | ICD-10-CM | POA: Diagnosis not present

## 2016-11-05 ENCOUNTER — Telehealth: Payer: Self-pay | Admitting: Family Medicine

## 2016-11-09 NOTE — Telephone Encounter (Signed)
Error

## 2016-12-06 DIAGNOSIS — Z4502 Encounter for adjustment and management of automatic implantable cardiac defibrillator: Secondary | ICD-10-CM | POA: Diagnosis not present

## 2016-12-06 DIAGNOSIS — I255 Ischemic cardiomyopathy: Secondary | ICD-10-CM | POA: Diagnosis not present

## 2016-12-06 DIAGNOSIS — Z95810 Presence of automatic (implantable) cardiac defibrillator: Secondary | ICD-10-CM | POA: Diagnosis not present

## 2017-01-10 DIAGNOSIS — I255 Ischemic cardiomyopathy: Secondary | ICD-10-CM | POA: Diagnosis not present

## 2017-01-10 DIAGNOSIS — Z4502 Encounter for adjustment and management of automatic implantable cardiac defibrillator: Secondary | ICD-10-CM | POA: Diagnosis not present

## 2017-01-10 DIAGNOSIS — Z95810 Presence of automatic (implantable) cardiac defibrillator: Secondary | ICD-10-CM | POA: Diagnosis not present

## 2017-02-14 DIAGNOSIS — Z4502 Encounter for adjustment and management of automatic implantable cardiac defibrillator: Secondary | ICD-10-CM | POA: Diagnosis not present

## 2017-02-14 DIAGNOSIS — I5042 Chronic combined systolic (congestive) and diastolic (congestive) heart failure: Secondary | ICD-10-CM | POA: Diagnosis not present

## 2017-02-14 DIAGNOSIS — I255 Ischemic cardiomyopathy: Secondary | ICD-10-CM | POA: Diagnosis not present

## 2017-02-14 DIAGNOSIS — Z95810 Presence of automatic (implantable) cardiac defibrillator: Secondary | ICD-10-CM | POA: Diagnosis not present

## 2017-03-03 DIAGNOSIS — I6523 Occlusion and stenosis of bilateral carotid arteries: Secondary | ICD-10-CM | POA: Diagnosis not present

## 2017-03-11 DIAGNOSIS — I25810 Atherosclerosis of coronary artery bypass graft(s) without angina pectoris: Secondary | ICD-10-CM | POA: Diagnosis not present

## 2017-03-11 DIAGNOSIS — I2510 Atherosclerotic heart disease of native coronary artery without angina pectoris: Secondary | ICD-10-CM | POA: Diagnosis not present

## 2017-03-11 DIAGNOSIS — I6523 Occlusion and stenosis of bilateral carotid arteries: Secondary | ICD-10-CM | POA: Diagnosis not present

## 2017-03-11 DIAGNOSIS — I5042 Chronic combined systolic (congestive) and diastolic (congestive) heart failure: Secondary | ICD-10-CM | POA: Diagnosis not present

## 2017-03-21 DIAGNOSIS — I255 Ischemic cardiomyopathy: Secondary | ICD-10-CM | POA: Diagnosis not present

## 2017-03-21 DIAGNOSIS — Z4502 Encounter for adjustment and management of automatic implantable cardiac defibrillator: Secondary | ICD-10-CM | POA: Diagnosis not present

## 2017-03-21 DIAGNOSIS — Z95810 Presence of automatic (implantable) cardiac defibrillator: Secondary | ICD-10-CM | POA: Diagnosis not present

## 2017-03-31 ENCOUNTER — Ambulatory Visit: Payer: Non-veteran care

## 2017-04-25 ENCOUNTER — Ambulatory Visit (INDEPENDENT_AMBULATORY_CARE_PROVIDER_SITE_OTHER): Payer: Medicare Other

## 2017-04-25 VITALS — BP 109/68 | HR 62 | Temp 98.0°F | Resp 14 | Ht 67.5 in | Wt 227.4 lb

## 2017-04-25 DIAGNOSIS — I255 Ischemic cardiomyopathy: Secondary | ICD-10-CM | POA: Diagnosis not present

## 2017-04-25 DIAGNOSIS — Z95810 Presence of automatic (implantable) cardiac defibrillator: Secondary | ICD-10-CM | POA: Diagnosis not present

## 2017-04-25 DIAGNOSIS — Z4502 Encounter for adjustment and management of automatic implantable cardiac defibrillator: Secondary | ICD-10-CM | POA: Diagnosis not present

## 2017-04-25 DIAGNOSIS — Z0000 Encounter for general adult medical examination without abnormal findings: Secondary | ICD-10-CM | POA: Diagnosis not present

## 2017-04-25 DIAGNOSIS — I5042 Chronic combined systolic (congestive) and diastolic (congestive) heart failure: Secondary | ICD-10-CM | POA: Diagnosis not present

## 2017-04-25 NOTE — Progress Notes (Signed)
Subjective:   Martin Bush is a 62 y.o. male who presents for Medicare Annual/Subsequent preventive examination.  Review of Systems:  No ROS.  Medicare Wellness Visit. Additional risk factors are reflected in the social history.  Cardiac Risk Factors include: male gender;hypertension     Objective:    Vitals: BP 109/68 (BP Location: Left Arm, Patient Position: Sitting, Cuff Size: Normal)   Pulse 62   Temp 98 F (36.7 C) (Oral)   Resp 14   Ht 5' 7.5" (1.715 m)   Wt 227 lb 6.4 oz (103.1 kg)   SpO2 96%   BMI 35.09 kg/m   Body mass index is 35.09 kg/m.  Advanced Directives 04/25/2017 05/15/2016 03/30/2016 07/13/2015 07/10/2015 07/16/2014  Does Patient Have a Medical Advance Directive? No No No No No No  Would patient like information on creating a medical advance directive? Yes (MAU/Ambulatory/Procedural Areas - Information given) - No - Patient declined No - patient declined information - -    Tobacco Social History   Tobacco Use  Smoking Status Never Smoker  Smokeless Tobacco Former Engineer, structural given: Not Answered   Clinical Intake:  Pre-visit preparation completed: Yes  Pain : No/denies pain     Nutritional Status: BMI 25 -29 Overweight Diabetes: Yes(Followed by the [facility]))  How often do you need to have someone help you when you read instructions, pamphlets, or other written materials from your doctor or pharmacy?: 1 - Never  Interpreter Needed?: No     Past Medical History:  Diagnosis Date  . Arthritis    hands   . Colon polyps   . Coronary artery disease   . Depression   . Diabetes mellitus without complication (HCC)    insulin dependent  . High cholesterol   . Hypertension   . MI (myocardial infarction) ([place])   . Proteinuria   . Sleep apnea    uses cpap   Past Surgical History:  Procedure Laterality Date  . CARDIAC CATHETERIZATION N/A 07/16/2014   Procedure: Left Heart Cath and Coronary Angiography;  Surgeon:  Adrian Prows, MD;  Location: [facility];  Service: Cardiovascular;  Laterality: N/A;  . CARDIAC CATHETERIZATION  07/16/2014   Procedure: Coronary Balloon Angioplasty;  Surgeon: Adrian Prows, MD;  Location: [facility];  Service: Cardiovascular;;  . CARDIAC SURGERY    . JOINT REPLACEMENT     Family History  Problem Relation Age of Onset  . Uterine cancer Mother   . Lung cancer Mother   . Hyperlipidemia Father   . Heart disease Father   . Hypertension Father   . Diabetes Father    Social History   Socioeconomic History  . Marital status: Married    Spouse name: Not on file  . Number of children: Not on file  . Years of education: Not on file  . Highest education level: Not on file  Occupational History  . Not on file  Social Needs  . Financial resource strain: Not hard at all  . Food insecurity:    Worry: Never true    Inability: Never true  . Transportation needs:    Medical: No    Non-medical: No  Tobacco Use  . Smoking status: Never Smoker  . Smokeless tobacco: Former Network engineer and Sexual Activity  . Alcohol use: Yes    Comment: OCC  . Drug use: No  . Sexual activity: Not Currently  Lifestyle  . Physical activity:  Days per week: 0 days    Minutes per session: Not on file  . Stress: Not on file  Relationships  . Social connections:    Talks on phone: Not on file    Gets together: Not on file    Attends religious service: Not on file    Active member of club or organization: Not on file    Attends meetings of clubs or organizations: Not on file    Relationship status: Not on file  Other Topics Concern  . Not on file  Social History Narrative  . Not on file    Outpatient Encounter Medications as of 04/25/2017  Medication Sig  . amiodarone (PACERONE) 200 MG tablet Take 200 mg by mouth daily.   [name] Kitchen aspirin EC 81 MG tablet Take 81 mg by mouth daily.  . carvedilol (COREG) 12.5 MG tablet Take 6.25 mg by mouth 2 (two) times daily with a meal.  .  Cholecalciferol (VITAMIN D) 2000 units tablet Take 2,000 Units by mouth 2 (two) times daily.  . hydrALAZINE (APRESOLINE) 25 MG tablet Take 25 mg by mouth 2 (two) times daily.  . insulin regular human CONCENTRATED (HUMULIN R U-500 KWIKPEN) 500 UNIT/ML kwikpen Inject 90-100 Units into the skin 2 (two) times daily with a meal. Inject 100 units in the morning and 90 in the evening (Patient taking differently: Inject 90-100 Units into the skin 2 (two) times daily with a meal. Inject 100 units subcutaneously every morning and 90 units at night)  . isosorbide mononitrate (IMDUR) 30 MG 24 hr tablet Take 30 mg by mouth daily.  . Liraglutide (VICTOZA ) Inject 1.8 Units into the skin daily.  . methocarbamol (ROBAXIN) 500 MG tablet Take 500 mg by mouth 4 (four) times daily.  . Omega-3 Fatty Acids (FISH OIL) 1000 MG CAPS Take 2,000 mg by mouth 3 (three) times daily.  . prasugrel (EFFIENT) 10 MG TABS tablet Take 10 mg by mouth daily.  . pravastatin (PRAVACHOL) 80 MG tablet Take 80 mg by mouth at bedtime.   [name] Kitchen PRESCRIPTION MEDICATION Inhale into the lungs at bedtime. CPAP  . sertraline (ZOLOFT) 100 MG tablet Take 50 mg by mouth daily.   . fluticasone (FLONASE) 50 MCG/ACT nasal spray Place 2 sprays into both nostrils daily. (Patient not taking: Reported on 05/24/2016)   No facility-administered encounter medications on file as of 04/25/2017.     Activities of Daily Living In your present state of health, do you have any difficulty performing the following activities: 04/25/2017  Hearing? N  Vision? N  Difficulty concentrating or making decisions? N  Walking or climbing stairs? N  Dressing or bathing? N  Doing errands, shopping? N  Preparing Food and eating ? N  Using the Toilet? N  In the past six months, have you accidently leaked urine? N  Do you have problems with loss of bowel control? N  Managing your Medications? N  Managing your Finances? N  Housekeeping or managing your Housekeeping? N  Some  recent data might be hidden    Patient Care Team: [name], MD as PCP - General (Family Medicine)   Assessment:   This is a routine wellness examination for [name].  The goal of the wellness visit is to assist the patient how to close the gaps in care and create a preventative care plan for the patient.   The roster of all physicians providing medical care to patient is listed in the Snapshot section of the chart.  Taking  calcium VIT D as appropriate/Osteoporosis risk reviewed.    Safety issues reviewed; Smoke and carbon monoxide detectors in the home. No firearms in the home. Wears seatbelts when driving or riding with others. No violence in the home.  They do not have excessive sun exposure.  Discussed the need for sun protection: hats, long sleeves and the use of sunscreen if there is significant sun exposure.  Patient is alert, normal appearance, oriented to person/place/and time.  Correctly identified the president of the [place] and recalls of 3/3 words. Performs simple calculations and can read correct time from watch face.  Displays appropriate judgement.  No new identified risk were noted.  No failures at ADL's or IADL's.    BMI- discussed the importance of a healthy diet, water intake and the benefits of aerobic exercise. Educational material provided.   24 hour diet recall: Diabetic diet  Dental- UTD.  Eye- Visual acuity not assessed per patient preference since they have regular follow up with the ophthalmologist.  Wears corrective lenses.  Sleep patterns- Sleeps 6-8 hours at night.  Wakes feeling rested. CPAP in use.  Patient Concerns: None at this time. Follow up with PCP as needed.  Exercise Activities and Dietary recommendations Current Exercise Habits: The patient does not participate in regular exercise at present  Goals    . Increase physical activity     Water aerobics with the silver sneaker program Walk for exercise       Fall Risk Fall  Risk  04/25/2017 03/30/2016  Falls in the past year? No No   Depression Screen PHQ 2/9 Scores 04/25/2017 03/30/2016  PHQ - 2 Score 0 0    Cognitive Function MMSE - Mini Mental State Exam 04/25/2017 03/30/2016  Orientation to time 5 5  Orientation to Place 5 5  Registration 3 3  Attention/ Calculation 5 5  Recall 3 3  Language- name 2 objects 2 2  Language- repeat 1 1  Language- follow 3 step command 3 3  Language- read & follow direction 1 1  Write a sentence 1 1  Copy design 1 1  Total score 30 30        Immunization History  Administered Date(s) Administered  . Influenza,inj,Quad PF,6+ Mos 03/15/2016  . Influenza-Unspecified 11/26/2014  . Pneumococcal-Unspecified 11/14/2013  . Tdap 02/25/2014    Screening Tests Health Maintenance  Topic Date Due  . URINE MICROALBUMIN  [birth year]1965  . OPHTHALMOLOGY EXAM  04/24/2016  . HEMOGLOBIN A1C  11/26/2016  . FOOT EXAM  12/29/2016  . INFLUENZA VACCINE  08/25/2017  . COLONOSCOPY  05/18/2018  . PNEUMOCOCCAL POLYSACCHARIDE VACCINE (2) 11/15/2018  . TETANUS/TDAP  02/26/2024  . Hepatitis C Screening  Completed  . HIV Screening  Completed      Plan:  End of life planning; Advanced aging; Advanced directives discussed.  No HCPOA/Living Will.  Additional information provided to help them start the conversation with family per physician verbal order.  Copy of HCPOA/Living Will requested upon completion. Time spent on this topic is 25 minutes.  I have personally reviewed and noted the following in the patient's chart:   . Medical and social history . Use of alcohol, tobacco or illicit drugs  . Current medications and supplements . Functional ability and status . Nutritional status . Physical activity . Advanced directives . List of other physicians . Hospitalizations, surgeries, and ER visits in previous 12 months . Vitals . Screenings to include cognitive, depression, and falls . Referrals and appointments  In addition, I  have  reviewed and discussed with patient certain preventive protocols, quality metrics, and best practice recommendations. A written personalized care plan for preventive services as well as general preventive health recommendations were provided to patient.     Varney Biles, LPN  07/04/7946

## 2017-04-25 NOTE — Patient Instructions (Addendum)
  [name] , Thank you for taking time to come for your Medicare Wellness Visit. I appreciate your ongoing commitment to your health goals. Please review the following plan we discussed and let me know if I can assist you in the future.   These are the goals we discussed: Goals    . Increase physical activity     Water aerobics with the silver sneaker program Walk for exercise       This is a list of the screening recommended for you and due dates:  Health Maintenance  Topic Date Due  . Urine Protein Check  12[birth year]24/1965  . Eye exam for diabetics  04/24/2016  . Hemoglobin A1C  11/26/2016  . Complete foot exam   12/29/2016  . Flu Shot  08/25/2017  . Colon Cancer Screening  05/18/2018  . Pneumococcal vaccine (2) 11/15/2018  . Tetanus Vaccine  02/26/2024  .  Hepatitis C: One time screening is recommended by Center for Disease Control  (CDC) for  adults born from [birth year] through 1965.   Completed  . HIV Screening  Completed

## 2017-04-28 ENCOUNTER — Telehealth: Payer: Self-pay

## 2017-04-28 NOTE — Telephone Encounter (Signed)
Message left for him to schedule for a follow up appointment in the next few months with Dr. Caryl Bis.   Please schedule appointment.

## 2017-05-27 ENCOUNTER — Encounter: Payer: Self-pay | Admitting: Internal Medicine

## 2017-05-27 ENCOUNTER — Ambulatory Visit (INDEPENDENT_AMBULATORY_CARE_PROVIDER_SITE_OTHER): Payer: Medicare Other | Admitting: Internal Medicine

## 2017-05-27 VITALS — BP 140/80 | HR 91 | Temp 98.2°F | Ht 67.5 in | Wt 233.0 lb

## 2017-05-27 DIAGNOSIS — W57XXXA Bitten or stung by nonvenomous insect and other nonvenomous arthropods, initial encounter: Secondary | ICD-10-CM

## 2017-05-27 DIAGNOSIS — L0390 Cellulitis, unspecified: Secondary | ICD-10-CM

## 2017-05-27 DIAGNOSIS — Z794 Long term (current) use of insulin: Secondary | ICD-10-CM | POA: Diagnosis not present

## 2017-05-27 DIAGNOSIS — E1165 Type 2 diabetes mellitus with hyperglycemia: Secondary | ICD-10-CM | POA: Diagnosis not present

## 2017-05-27 MED ORDER — DOXYCYCLINE HYCLATE 100 MG PO TABS: 100.0000 mg | ORAL_TABLET | Freq: Two times a day (BID) | ORAL | 0 refills | Status: DC

## 2017-05-27 MED ORDER — MUPIROCIN 2 % EX OINT: 1.0000 "application " | TOPICAL_OINTMENT | Freq: Two times a day (BID) | CUTANEOUS | 0 refills | Status: DC

## 2017-05-27 MED ORDER — HYDROCORTISONE 2.5 % EX CREA: TOPICAL_CREAM | Freq: Two times a day (BID) | CUTANEOUS | 1 refills | Status: DC

## 2017-05-27 NOTE — Patient Instructions (Addendum)
F/u 06/13/17 week with PCP Cellulitis, Adult Cellulitis is a skin infection. The infected area is usually red and tender. This condition occurs most often in the arms and lower legs. The infection can travel to the muscles, blood, and underlying tissue and become serious. It is very important to get treated for this condition. What are the causes? Cellulitis is caused by bacteria. The bacteria enter through a break in the skin, such as a cut, burn, insect bite, open sore, or crack. What increases the risk? This condition is more likely to occur in people who:  Have a weak defense system (immune system).  Have open wounds on the skin such as cuts, burns, bites, and scrapes. Bacteria can enter the body through these open wounds.  Are older.  Have diabetes.  Have a type of long-lasting (chronic) liver disease (cirrhosis) or kidney disease.  Use IV drugs.  What are the signs or symptoms? Symptoms of this condition include:  Redness, streaking, or spotting on the skin.  Swollen area of the skin.  Tenderness or pain when an area of the skin is touched.  Warm skin.  Fever.  Chills.  Blisters.  How is this diagnosed? This condition is diagnosed based on a medical history and physical exam. You may also have tests, including:  Blood tests.  Lab tests.  Imaging tests.  How is this treated? Treatment for this condition may include:  Medicines, such as antibiotic medicines or antihistamines.  Supportive care, such as rest and application of cold or warm cloths (cold or warm compresses) to the skin.  Hospital care, if the condition is severe.  The infection usually gets better within 1-2 days of treatment. Follow these instructions at home:  Take over-the-counter and prescription medicines only as told by your health care provider.  If you were prescribed an antibiotic medicine, take it as told by your health care provider. Do not stop taking the antibiotic even if you  start to feel better.  Drink enough fluid to keep your urine clear or pale yellow.  Do not touch or rub the infected area.  Raise (elevate) the infected area above the level of your heart while you are sitting or lying down.  Apply warm or cold compresses to the affected area as told by your health care provider.  Keep all follow-up visits as told by your health care provider. This is important. These visits let your health care provider make sure a more serious infection is not developing. Contact a health care provider if:  You have a fever.  Your symptoms do not improve within 1-2 days of starting treatment.  Your bone or joint underneath the infected area becomes painful after the skin has healed.  Your infection returns in the same area or another area.  You notice a swollen bump in the infected area.  You develop new symptoms.  You have a general ill feeling (malaise) with muscle aches and pains. Get help right away if:  Your symptoms get worse.  You feel very sleepy.  You develop vomiting or diarrhea that persists.  You notice red streaks coming from the infected area.  Your red area gets larger or turns dark in color. This information is not intended to replace advice given to you by your health care provider. Make sure you discuss any questions you have with your health care provider. Document Released: 10/21/2004 Document Revised: 05/22/2015 Document Reviewed: 11/20/2014 Elsevier Interactive Patient Education  [employer].

## 2017-05-27 NOTE — Progress Notes (Signed)
Pre visit review using our clinic review tool, if applicable. No additional management support is needed unless otherwise documented below in the visit note. 

## 2017-05-27 NOTE — Progress Notes (Signed)
Chief Complaint  Patient presents with  . Insect Bite   F/u  1. He has skin lesion right lateral back x 2 weeks ? Bite or what bite him it is itching not painful and since had this his cbgs have been 250s  2. [facility] no longer on regular insulin humulin R on novology 70/30 90 units qam and 80 qpm jardiance 25 and metformin 1000 mg bid with victoza 1.8 last A1C per our records >9 per had 02/2017 [facility] 7.5  3. meds he is unsure of a lot of meds effient 10 mg qd he is off he states advised him to bring in all meds to next visit to review with PCP  Review of Systems  Constitutional: Positive for weight loss.  HENT: Negative for hearing loss.   Eyes: Negative for blurred vision.  Respiratory: Negative for shortness of breath.   Cardiovascular: Negative for chest pain.  Skin: Positive for itching.       +bite righ tback   Endo/Heme/Allergies:       +elevated cbgs    Past Medical History:  Diagnosis Date  . Arthritis    hands   . Colon polyps   . Coronary artery disease   . Depression   . Diabetes mellitus without complication (HCC)    insulin dependent  . High cholesterol   . Hypertension   . MI (myocardial infarction) ([place])   . Proteinuria   . Sleep apnea    uses cpap   Past Surgical History:  Procedure Laterality Date  . CARDIAC CATHETERIZATION N/A 07/16/2014   Procedure: Left Heart Cath and Coronary Angiography;  Surgeon: Adrian Prows, MD;  Location: [place] [facility];  Service: Cardiovascular;  Laterality: N/A;  . CARDIAC CATHETERIZATION  07/16/2014   Procedure: Coronary Balloon Angioplasty;  Surgeon: Adrian Prows, MD;  Location: [place] [facility];  Service: Cardiovascular;;  . CARDIAC SURGERY    . JOINT REPLACEMENT     Family History  Problem Relation Age of Onset  . Uterine cancer Mother   . Lung cancer Mother   . Hyperlipidemia Father   . Heart disease Father   . Hypertension Father   . Diabetes Father    Social History    Socioeconomic History  . Marital status: Married    Spouse name: Not on file  . Number of children: Not on file  . Years of education: Not on file  . Highest education level: Not on file  Occupational History  . Not on file  Social Needs  . Financial resource strain: Not hard at all  . Food insecurity:    Worry: Never true    Inability: Never true  . Transportation needs:    Medical: No    Non-medical: No  Tobacco Use  . Smoking status: Never Smoker  . Smokeless tobacco: Former Network engineer and Sexual Activity  . Alcohol use: Yes    Comment: OCC  . Drug use: No  . Sexual activity: Not Currently  Lifestyle  . Physical activity:    Days per week: 0 days    Minutes per session: Not on file  . Stress: Not on file  Relationships  . Social connections:    Talks on phone: Not on file    Gets together: Not on file    Attends religious service: Not on file    Active member of club or organization: Not on file    Attends  meetings of clubs or organizations: Not on file    Relationship status: Not on file  . Intimate partner violence:    Fear of current or ex partner: No    Emotionally abused: No    Physically abused: No    Forced sexual activity: No  Other Topics Concern  . Not on file  Social History Narrative  . Not on file   Current Meds  Medication Sig  . amiodarone (PACERONE) 200 MG tablet Take 200 mg by mouth daily.   [name] Kitchen aspirin EC 81 MG tablet Take 81 mg by mouth daily.  . carvedilol (COREG) 12.5 MG tablet Take 6.25 mg by mouth 2 (two) times daily with a meal.  . Cholecalciferol (VITAMIN D) 2000 units tablet Take 2,000 Units by mouth 2 (two) times daily.  . empagliflozin (JARDIANCE) 25 MG TABS tablet Take 25 mg by mouth daily.  . hydrALAZINE (APRESOLINE) 25 MG tablet Take 25 mg by mouth 2 (two) times daily.  . insulin aspart protamine- aspart (NOVOLOG MIX 70/30) (70-30) 100 UNIT/ML injection Inject into the skin. 90 units in the am and 80 u qhs  . isosorbide  mononitrate (IMDUR) 30 MG 24 hr tablet Take 30 mg by mouth daily.  . Liraglutide (VICTOZA [place]) Inject 1.8 Units into the skin daily.  . metFORMIN (GLUCOPHAGE) 1000 MG tablet Take 1,000 mg by mouth 2 (two) times daily with a meal.  . methocarbamol (ROBAXIN) 500 MG tablet Take 500 mg by mouth 4 (four) times daily.  . Omega-3 Fatty Acids (FISH OIL) 1000 MG CAPS Take 2,000 mg by mouth 3 (three) times daily.  . pravastatin (PRAVACHOL) 80 MG tablet Take 80 mg by mouth at bedtime.   [name] Kitchen PRESCRIPTION MEDICATION Inhale into the lungs at bedtime. CPAP  . sertraline (ZOLOFT) 100 MG tablet Take 50 mg by mouth daily.   . [DISCONTINUED] insulin regular human CONCENTRATED (HUMULIN R U-500 KWIKPEN) 500 UNIT/ML kwikpen Inject 90-100 Units into the skin 2 (two) times daily with a meal. Inject 100 units in the morning and 90 in the evening (Patient taking differently: Inject 90-100 Units into the skin 2 (two) times daily with a meal. Inject 100 units subcutaneously every morning and 90 units at night)  . [DISCONTINUED] prasugrel (EFFIENT) 10 MG TABS tablet Take 10 mg by mouth daily.   Allergies  Allergen Reactions  . Diltiazem Rash   No results found for this or any previous visit (from the past 2160 hour(s)). Objective  Body mass index is 35.95 kg/m. Wt Readings from Last 3 Encounters:  05/27/17 233 lb (105.7 kg)  04/25/17 227 lb 6.4 oz (103.1 kg)  08/04/16 261 lb 6.4 oz (118.6 kg)   Temp Readings from Last 3 Encounters:  05/27/17 98.2 F (36.8 C) (Oral)  04/25/17 98 F (36.7 C) (Oral)  08/04/16 98.3 F (36.8 C) (Oral)   BP Readings from Last 3 Encounters:  05/27/17 140/80  04/25/17 109/68  08/04/16 118/80   Pulse Readings from Last 3 Encounters:  05/27/17 91  04/25/17 62  08/04/16 74    Physical Exam  Constitutional: He is oriented to person, place, and time. Vital signs are normal. He appears well-developed and well-nourished. He is cooperative.  HENT:  Head: Normocephalic and atraumatic.    Mouth/Throat: Oropharynx is clear and moist and mucous membranes are normal.  Eyes: Pupils are equal, round, and reactive to light. Conjunctivae are normal.  Cardiovascular: Normal rate, regular rhythm and normal heart sounds.  Pulmonary/Chest: Effort normal and breath sounds  normal.  Neurological: He is alert and oriented to person, place, and time. Gait normal.  Skin: There is erythema.     Right lateral bite with erythema surrounding and cellulitis  Photo taken today with pts phone    Psychiatric: He has a normal mood and affect. His speech is normal and behavior is normal. Judgment and thought content normal. Cognition and memory are normal.  Nursing note and vitals reviewed.   Assessment   1. Right lateral back cellulitis  2. DM 2 with hyperglycemia last A1C per pt 7.5 with [facility] 02/2017  Plan   2. Doxy bid x 10 days  bactroban hc 2.5 mg bid prn  Photo taken today on pts phone to show PCP at f/u  2.  meds changed updated in chart due to pt being unsure of all meds bring all meds into f/u  Take meds at rx  cbgs uncontrolled likely 2/2 #1    Provider: Dr. Olivia Mackie McLean-Scocuzza-Internal Medicine

## 2017-05-30 DIAGNOSIS — Z95810 Presence of automatic (implantable) cardiac defibrillator: Secondary | ICD-10-CM | POA: Diagnosis not present

## 2017-05-30 DIAGNOSIS — Z4502 Encounter for adjustment and management of automatic implantable cardiac defibrillator: Secondary | ICD-10-CM | POA: Diagnosis not present

## 2017-06-13 ENCOUNTER — Ambulatory Visit: Payer: Medicare Other | Admitting: Family Medicine

## 2017-06-17 ENCOUNTER — Ambulatory Visit (INDEPENDENT_AMBULATORY_CARE_PROVIDER_SITE_OTHER): Payer: Medicare Other | Admitting: Family Medicine

## 2017-06-17 ENCOUNTER — Encounter: Payer: Self-pay | Admitting: Family Medicine

## 2017-06-17 DIAGNOSIS — E6609 Other obesity due to excess calories: Secondary | ICD-10-CM

## 2017-06-17 DIAGNOSIS — E1165 Type 2 diabetes mellitus with hyperglycemia: Secondary | ICD-10-CM | POA: Diagnosis not present

## 2017-06-17 DIAGNOSIS — Z6834 Body mass index (BMI) 34.0-34.9, adult: Secondary | ICD-10-CM | POA: Diagnosis not present

## 2017-06-17 DIAGNOSIS — I2510 Atherosclerotic heart disease of native coronary artery without angina pectoris: Secondary | ICD-10-CM | POA: Diagnosis not present

## 2017-06-17 DIAGNOSIS — Z794 Long term (current) use of insulin: Secondary | ICD-10-CM | POA: Diagnosis not present

## 2017-06-17 DIAGNOSIS — E669 Obesity, unspecified: Secondary | ICD-10-CM | POA: Insufficient documentation

## 2017-06-17 DIAGNOSIS — I10 Essential (primary) hypertension: Secondary | ICD-10-CM | POA: Diagnosis not present

## 2017-06-17 NOTE — Assessment & Plan Note (Signed)
Asymptomatic.  Continue to follow with cardiology.

## 2017-06-17 NOTE — Progress Notes (Signed)
  Tommi Rumps, MD Phone: [phone]  Martin Bush is a 62 y.o. male who presents today for f/u.  CC: htn, dm, CAD  HYPERTENSION  Disease Monitoring  Home BP Monitoring does not check Chest pain- no    Dyspnea- no Medications  Compliance-  Taking coreg, hydralazine, imdur.  Edema- no  DIABETES Disease Monitoring: Blood Sugar ranges-140 this am, upper 100s-low 200s Polyuria/phagia/dipsia- no      Optho- see's every 4 months, due to see them in 2 weeks Medications: Compliance- taking jardiance, victoza, novolog mix 70/30 Hypoglycemic symptoms- rare, takes a glucose tablet and improves  CAD: Continues on Imdur.  Saw his cardiologist 2 months ago.  No longer on Effient.  He is on aspirin.  He has changed his diet fairly significantly particularly after getting dentures.  He is eating more salads and chicken.  Lot more vegetables.  He was previously eating anything and everything.  He is got more energy now.  He is not doing much exercise.  He was treated for cellulitis previously on his back.  Reports the area has improved significantly.   Social History   Tobacco Use  Smoking Status Never Smoker  Smokeless Tobacco Former User     ROS see history of present illness  Objective  Physical Exam Vitals:   06/17/17 1059  BP: (!) 142/84  Pulse: 81  Temp: 98.2 F (36.8 C)  SpO2: 94%    BP Readings from Last 3 Encounters:  06/17/17 (!) 142/84  05/27/17 140/80  04/25/17 109/68   Wt Readings from Last 3 Encounters:  06/17/17 226 lb 9.6 oz (102.8 kg)  05/27/17 233 lb (105.7 kg)  04/25/17 227 lb 6.4 oz (103.1 kg)    Physical Exam  Constitutional: No distress.  Cardiovascular: Normal rate, regular rhythm and normal heart sounds.  Pulmonary/Chest: Effort normal and breath sounds normal.  Musculoskeletal: He exhibits no edema.  Neurological: He is alert.  Skin: Skin is warm and dry. He is not diaphoretic.        Assessment/Plan: Please see individual problem  list.  CAD (coronary artery disease) Asymptomatic.  Continue to follow with cardiology.  Essential hypertension Slightly above goal though had been well controlled.  He will monitor at home and let us know what it has been running after several weeks.  He will see his [place] and have them check it as well.  He will continue his current regimen at this time.  Diabetes [facility]) Patient is following with endocrinology at the [place].  He will continue his current regimen and see them next month for an A1c and other labs.  Obesity Patient has been working on some fairly significant dietary changes that have led to some good weight loss.  I encouraged him to continue that and to work on exercise with low intensity exercise to start with.  Prior area of cellulitis has improved.  Small scab that should continue to heal.  Health Maintenance: He reports A1c and urine microalbumin done through endocrinology at the [facility].  He will have them fax his next set of labs to [place].  No orders of the defined types were placed in this encounter.   No orders of the defined types were placed in this encounter.    Tommi Rumps, MD [place]

## 2017-06-17 NOTE — Patient Instructions (Addendum)
Nice to see you. Please try to have your [place] physician send [place] their note and upcoming labs. Please continue to work on your diet and try to incorporate a little bit of exercise. Please start monitoring your blood pressure at home dose in several weeks with your readings.  Please additionally see what it is when you see the [facility] physician.

## 2017-06-17 NOTE — Assessment & Plan Note (Addendum)
Slightly above goal though had been well controlled.  He will monitor at home and let us know what it has been running after several weeks.  He will see his [place] and have them check it as well.  He will continue his current regimen at this time.

## 2017-06-17 NOTE — Assessment & Plan Note (Signed)
Patient has been working on some fairly significant dietary changes that have led to some good weight loss.  I encouraged him to continue that and to work on exercise with low intensity exercise to start with.

## 2017-06-17 NOTE — Assessment & Plan Note (Signed)
Patient is following with endocrinology at the [place].  He will continue his current regimen and see them next month for an A1c and other labs.

## 2017-08-02 ENCOUNTER — Emergency Department (HOSPITAL_COMMUNITY): Payer: No Typology Code available for payment source

## 2017-08-02 ENCOUNTER — Inpatient Hospital Stay (HOSPITAL_COMMUNITY)
Admission: EM | Admit: 2017-08-02 | Discharge: 2017-08-05 | DRG: 250 | Disposition: A | Discharge status: Home / Self Care | Payer: No Typology Code available for payment source | Attending: Cardiology | Admitting: Cardiology

## 2017-08-02 ENCOUNTER — Other Ambulatory Visit: Payer: Self-pay

## 2017-08-02 ENCOUNTER — Encounter (HOSPITAL_COMMUNITY): Payer: Self-pay | Admitting: Emergency Medicine

## 2017-08-02 DIAGNOSIS — Z8249 Family history of ischemic heart disease and other diseases of the circulatory system: Secondary | ICD-10-CM

## 2017-08-02 DIAGNOSIS — Z888 Allergy status to other drugs, medicaments and biological substances status: Secondary | ICD-10-CM

## 2017-08-02 DIAGNOSIS — E11649 Type 2 diabetes mellitus with hypoglycemia without coma: Secondary | ICD-10-CM | POA: Diagnosis not present

## 2017-08-02 DIAGNOSIS — I5043 Acute on chronic combined systolic (congestive) and diastolic (congestive) heart failure: Secondary | ICD-10-CM | POA: Diagnosis not present

## 2017-08-02 DIAGNOSIS — M545 Low back pain: Secondary | ICD-10-CM | POA: Diagnosis not present

## 2017-08-02 DIAGNOSIS — Z4502 Encounter for adjustment and management of automatic implantable cardiac defibrillator: Secondary | ICD-10-CM | POA: Diagnosis not present

## 2017-08-02 DIAGNOSIS — I25119 Atherosclerotic heart disease of native coronary artery with unspecified angina pectoris: Secondary | ICD-10-CM | POA: Diagnosis not present

## 2017-08-02 DIAGNOSIS — D631 Anemia in chronic kidney disease: Secondary | ICD-10-CM | POA: Diagnosis present

## 2017-08-02 DIAGNOSIS — I6523 Occlusion and stenosis of bilateral carotid arteries: Secondary | ICD-10-CM | POA: Diagnosis present

## 2017-08-02 DIAGNOSIS — N183 Chronic kidney disease, stage 3 (moderate): Secondary | ICD-10-CM | POA: Diagnosis present

## 2017-08-02 DIAGNOSIS — E1122 Type 2 diabetes mellitus with diabetic chronic kidney disease: Secondary | ICD-10-CM | POA: Diagnosis not present

## 2017-08-02 DIAGNOSIS — I2510 Atherosclerotic heart disease of native coronary artery without angina pectoris: Secondary | ICD-10-CM | POA: Diagnosis not present

## 2017-08-02 DIAGNOSIS — G8929 Other chronic pain: Secondary | ICD-10-CM | POA: Diagnosis not present

## 2017-08-02 DIAGNOSIS — Z86010 Personal history of colonic polyps: Secondary | ICD-10-CM

## 2017-08-02 DIAGNOSIS — Z8679 Personal history of other diseases of the circulatory system: Secondary | ICD-10-CM

## 2017-08-02 DIAGNOSIS — E1165 Type 2 diabetes mellitus with hyperglycemia: Secondary | ICD-10-CM | POA: Diagnosis present

## 2017-08-02 DIAGNOSIS — I255 Ischemic cardiomyopathy: Secondary | ICD-10-CM | POA: Diagnosis not present

## 2017-08-02 DIAGNOSIS — Z8349 Family history of other endocrine, nutritional and metabolic diseases: Secondary | ICD-10-CM

## 2017-08-02 DIAGNOSIS — R42 Dizziness and giddiness: Secondary | ICD-10-CM | POA: Diagnosis not present

## 2017-08-02 DIAGNOSIS — Z8674 Personal history of sudden cardiac arrest: Secondary | ICD-10-CM

## 2017-08-02 DIAGNOSIS — Y838 Other surgical procedures as the cause of abnormal reaction of the patient, or of later complication, without mention of misadventure at the time of the procedure: Secondary | ICD-10-CM | POA: Diagnosis present

## 2017-08-02 DIAGNOSIS — E669 Obesity, unspecified: Secondary | ICD-10-CM | POA: Diagnosis present

## 2017-08-02 DIAGNOSIS — Z79899 Other long term (current) drug therapy: Secondary | ICD-10-CM

## 2017-08-02 DIAGNOSIS — I472 Ventricular tachycardia, unspecified: Secondary | ICD-10-CM

## 2017-08-02 DIAGNOSIS — Z6834 Body mass index (BMI) 34.0-34.9, adult: Secondary | ICD-10-CM

## 2017-08-02 DIAGNOSIS — Z95810 Presence of automatic (implantable) cardiac defibrillator: Secondary | ICD-10-CM | POA: Diagnosis not present

## 2017-08-02 DIAGNOSIS — R0602 Shortness of breath: Secondary | ICD-10-CM

## 2017-08-02 DIAGNOSIS — K439 Ventral hernia without obstruction or gangrene: Secondary | ICD-10-CM | POA: Diagnosis present

## 2017-08-02 DIAGNOSIS — Z006 Encounter for examination for normal comparison and control in clinical research program: Secondary | ICD-10-CM | POA: Diagnosis not present

## 2017-08-02 DIAGNOSIS — T82855A Stenosis of coronary artery stent, initial encounter: Principal | ICD-10-CM | POA: Diagnosis present

## 2017-08-02 DIAGNOSIS — G4733 Obstructive sleep apnea (adult) (pediatric): Secondary | ICD-10-CM | POA: Diagnosis not present

## 2017-08-02 DIAGNOSIS — Z9861 Coronary angioplasty status: Secondary | ICD-10-CM

## 2017-08-02 DIAGNOSIS — Z87891 Personal history of nicotine dependence: Secondary | ICD-10-CM

## 2017-08-02 DIAGNOSIS — F329 Major depressive disorder, single episode, unspecified: Secondary | ICD-10-CM | POA: Diagnosis present

## 2017-08-02 DIAGNOSIS — I130 Hypertensive heart and chronic kidney disease with heart failure and stage 1 through stage 4 chronic kidney disease, or unspecified chronic kidney disease: Secondary | ICD-10-CM | POA: Diagnosis present

## 2017-08-02 DIAGNOSIS — Z951 Presence of aortocoronary bypass graft: Secondary | ICD-10-CM

## 2017-08-02 DIAGNOSIS — Z801 Family history of malignant neoplasm of trachea, bronchus and lung: Secondary | ICD-10-CM

## 2017-08-02 DIAGNOSIS — Z7982 Long term (current) use of aspirin: Secondary | ICD-10-CM

## 2017-08-02 DIAGNOSIS — E782 Mixed hyperlipidemia: Secondary | ICD-10-CM | POA: Diagnosis not present

## 2017-08-02 DIAGNOSIS — Z955 Presence of coronary angioplasty implant and graft: Secondary | ICD-10-CM

## 2017-08-02 DIAGNOSIS — Z833 Family history of diabetes mellitus: Secondary | ICD-10-CM

## 2017-08-02 DIAGNOSIS — I25710 Atherosclerosis of autologous vein coronary artery bypass graft(s) with unstable angina pectoris: Secondary | ICD-10-CM | POA: Diagnosis not present

## 2017-08-02 DIAGNOSIS — I252 Old myocardial infarction: Secondary | ICD-10-CM

## 2017-08-02 DIAGNOSIS — Z794 Long term (current) use of insulin: Secondary | ICD-10-CM

## 2017-08-02 HISTORY — DX: Heart failure, unspecified: I50.9

## 2017-08-02 HISTORY — DX: Obstructive sleep apnea (adult) (pediatric): G47.33

## 2017-08-02 HISTORY — DX: Presence of automatic (implantable) cardiac defibrillator: Z95.810

## 2017-08-02 HISTORY — DX: Pneumonia, unspecified organism: J18.9

## 2017-08-02 HISTORY — DX: Obstructive sleep apnea (adult) (pediatric): Z99.89

## 2017-08-02 HISTORY — DX: Type 2 diabetes mellitus without complications: E11.9

## 2017-08-02 LAB — BASIC METABOLIC PANEL
Anion gap: 9 (ref 5–15)
BUN: 14 mg/dL (ref 8–23)
CO2: 25 mmol/L (ref 22–32)
Calcium: 9.1 mg/dL (ref 8.9–10.3)
Chloride: 106 mmol/L (ref 98–111)
Creatinine, Ser: 1.19 mg/dL (ref 0.61–1.24)
GFR calc Af Amer: >60 mL/min (ref >60)
GFR calc non Af Amer: >60 mL/min (ref >60)
Glucose, Bld: 79 mg/dL (ref 70–99)
Potassium: 3.6 mmol/L (ref 3.5–5.1)
Sodium: 140 mmol/L (ref 135–145)

## 2017-08-02 LAB — CBC
HCT: 44.3 % (ref 39.0–52.0)
HCT: 44.7 % (ref 39.0–52.0)
Hemoglobin: 13.5 g/dL (ref 13.0–17.0)
Hemoglobin: 14.1 g/dL (ref 13.0–17.0)
MCH: 29.1 pg (ref 26.0–34.0)
MCH: 29.7 pg (ref 26.0–34.0)
MCHC: 30.5 g/dL (ref 30.0–36.0)
MCHC: 31.5 g/dL (ref 30.0–36.0)
MCV: 95.5 fL (ref 78.0–100.0)
MCV: 94.3 fL (ref 78.0–100.0)
Platelets: 193 10*3/uL (ref 150–400)
Platelets: 219 10*3/uL (ref 150–400)
RBC: 4.64 MIL/uL (ref 4.22–5.81)
RBC: 4.74 MIL/uL (ref 4.22–5.81)
RDW: 13.2 % (ref 11.5–15.5)
RDW: 13.4 % (ref 11.5–15.5)
WBC: 7.2 10*3/uL (ref 4.0–10.5)
WBC: 8.5 10*3/uL (ref 4.0–10.5)

## 2017-08-02 LAB — URINALYSIS, ROUTINE W REFLEX MICROSCOPIC
Bacteria, UA: NONE SEEN
Bilirubin Urine: NEGATIVE
Glucose, UA: >=500 mg/dL — AB
Hgb urine dipstick: NEGATIVE
Ketones, ur: NEGATIVE mg/dL
Leukocytes, UA: NEGATIVE
Nitrite: NEGATIVE
Protein, ur: NEGATIVE mg/dL
Specific Gravity, Urine: 1.012 (ref 1.005–1.030)
pH: 5 (ref 5.0–8.0)

## 2017-08-02 LAB — CREATININE, SERUM
Creatinine, Ser: 1.17 mg/dL (ref 0.61–1.24)
GFR calc Af Amer: >60 mL/min (ref >60)
GFR calc non Af Amer: >60 mL/min (ref >60)

## 2017-08-02 LAB — GLUCOSE, CAPILLARY: Glucose-Capillary: 119 mg/dL — ABNORMAL HIGH (ref 70–99)

## 2017-08-02 LAB — TROPONIN I
Troponin I: 0.03 ng/mL (ref <0.03)
Troponin I: 0.03 ng/mL (ref <0.03)
Troponin I: 0.03 ng/mL (ref <0.03)

## 2017-08-02 LAB — MRSA PCR SCREENING: MRSA by PCR: NEGATIVE

## 2017-08-02 LAB — CBG MONITORING, ED
Glucose-Capillary: 68 mg/dL — ABNORMAL LOW (ref 70–99)
Glucose-Capillary: 81 mg/dL (ref 70–99)
Glucose-Capillary: 64 mg/dL — ABNORMAL LOW (ref 70–99)

## 2017-08-02 LAB — BRAIN NATRIURETIC PEPTIDE: B Natriuretic Peptide: 838.4 pg/mL — ABNORMAL HIGH (ref 0.0–100.0)

## 2017-08-02 LAB — MAGNESIUM: Magnesium: 2.1 mg/dL (ref 1.7–2.4)

## 2017-08-02 MED ORDER — FUROSEMIDE 10 MG/ML IJ SOLN
40.0000 mg | Freq: Once | INTRAMUSCULAR | Status: AC
  Administered 2017-08-02: 40 mg via INTRAVENOUS
  Filled 2017-08-02: qty 4

## 2017-08-02 MED ORDER — ONDANSETRON HCL 4 MG/2ML IJ SOLN: 4.0000 mg | Freq: Four times a day (QID) | INTRAMUSCULAR | Status: DC | PRN

## 2017-08-02 MED ORDER — MAGNESIUM OXIDE 400 (241.3 MG) MG PO TABS
400.0000 mg | ORAL_TABLET | Freq: Two times a day (BID) | ORAL | Status: DC
  Administered 2017-08-02 – 2017-08-05 (×6): 400 mg via ORAL
  Filled 2017-08-02 (×6): qty 1

## 2017-08-02 MED ORDER — LIRAGLUTIDE 18 MG/3ML ~~LOC~~ SOPN: 1.8000 mg | PEN_INJECTOR | Freq: Every day | SUBCUTANEOUS | Status: DC

## 2017-08-02 MED ORDER — ZOLPIDEM TARTRATE 5 MG PO TABS: 5.0000 mg | ORAL_TABLET | Freq: Every evening | ORAL | Status: DC | PRN

## 2017-08-02 MED ORDER — ACETAMINOPHEN 325 MG PO TABS: 650.0000 mg | ORAL_TABLET | ORAL | Status: DC | PRN

## 2017-08-02 MED ORDER — HEPARIN SODIUM (PORCINE) 5000 UNIT/ML IJ SOLN
5000.0000 [IU] | Freq: Three times a day (TID) | INTRAMUSCULAR | Status: DC
  Administered 2017-08-02 – 2017-08-05 (×7): 5000 [IU] via SUBCUTANEOUS
  Filled 2017-08-02 (×7): qty 1

## 2017-08-02 MED ORDER — VITAMIN D 1000 UNITS PO TABS
2000.0000 [IU] | ORAL_TABLET | Freq: Two times a day (BID) | ORAL | Status: DC
  Administered 2017-08-03 – 2017-08-05 (×5): 2000 [IU] via ORAL
  Filled 2017-08-02 (×5): qty 2

## 2017-08-02 MED ORDER — METFORMIN HCL 500 MG PO TABS
1000.0000 mg | ORAL_TABLET | Freq: Two times a day (BID) | ORAL | Status: DC
  Administered 2017-08-03 (×2): 1000 mg via ORAL
  Filled 2017-08-02 (×2): qty 2

## 2017-08-02 MED ORDER — SODIUM CHLORIDE 0.9% FLUSH: 3.0000 mL | INTRAVENOUS | Status: DC | PRN

## 2017-08-02 MED ORDER — CANAGLIFLOZIN 100 MG PO TABS
100.0000 mg | ORAL_TABLET | Freq: Every day | ORAL | Status: DC
  Administered 2017-08-03 – 2017-08-05 (×2): 100 mg via ORAL
  Filled 2017-08-02 (×3): qty 1

## 2017-08-02 MED ORDER — SODIUM CHLORIDE 0.9 % IV SOLN: 250.0000 mL | INTRAVENOUS | Status: DC | PRN

## 2017-08-02 MED ORDER — CARVEDILOL 6.25 MG PO TABS: 6.2500 mg | ORAL_TABLET | Freq: Two times a day (BID) | ORAL | Status: DC

## 2017-08-02 MED ORDER — POTASSIUM CHLORIDE CRYS ER 20 MEQ PO TBCR
40.0000 meq | EXTENDED_RELEASE_TABLET | Freq: Once | ORAL | Status: AC
  Administered 2017-08-02: 40 meq via ORAL
  Filled 2017-08-02: qty 2

## 2017-08-02 MED ORDER — FUROSEMIDE 10 MG/ML IJ SOLN: 40.0000 mg | INTRAMUSCULAR | Status: DC

## 2017-08-02 MED ORDER — ASPIRIN EC 81 MG PO TBEC
81.0000 mg | DELAYED_RELEASE_TABLET | Freq: Every day | ORAL | Status: DC
  Administered 2017-08-03 – 2017-08-05 (×3): 81 mg via ORAL
  Filled 2017-08-02 (×3): qty 1

## 2017-08-02 MED ORDER — PRAVASTATIN SODIUM 40 MG PO TABS
80.0000 mg | ORAL_TABLET | Freq: Every day | ORAL | Status: DC
  Administered 2017-08-02 – 2017-08-04 (×3): 80 mg via ORAL
  Filled 2017-08-02 (×3): qty 2

## 2017-08-02 MED ORDER — ISOSORBIDE MONONITRATE ER 30 MG PO TB24
30.0000 mg | ORAL_TABLET | Freq: Every day | ORAL | Status: DC
  Administered 2017-08-03 – 2017-08-04 (×2): 30 mg via ORAL
  Filled 2017-08-02 (×3): qty 1

## 2017-08-02 MED ORDER — SERTRALINE HCL 50 MG PO TABS
50.0000 mg | ORAL_TABLET | Freq: Every day | ORAL | Status: DC
  Administered 2017-08-03 – 2017-08-05 (×2): 50 mg via ORAL
  Filled 2017-08-02 (×2): qty 1

## 2017-08-02 MED ORDER — AMIODARONE HCL 200 MG PO TABS: 200.0000 mg | ORAL_TABLET | Freq: Every day | ORAL | Status: DC

## 2017-08-02 MED ORDER — HYDRALAZINE HCL 25 MG PO TABS
25.0000 mg | ORAL_TABLET | Freq: Three times a day (TID) | ORAL | Status: DC
  Administered 2017-08-02 – 2017-08-05 (×8): 25 mg via ORAL
  Filled 2017-08-02 (×9): qty 1

## 2017-08-02 MED ORDER — OMEGA-3-ACID ETHYL ESTERS 1 G PO CAPS
2.0000 g | ORAL_CAPSULE | Freq: Two times a day (BID) | ORAL | Status: DC
  Administered 2017-08-02 – 2017-08-05 (×6): 2 g via ORAL
  Filled 2017-08-02 (×6): qty 2

## 2017-08-02 MED ORDER — FUROSEMIDE 10 MG/ML IJ SOLN
40.0000 mg | INTRAMUSCULAR | Status: DC
  Administered 2017-08-03 – 2017-08-05 (×7): 40 mg via INTRAVENOUS
  Filled 2017-08-02 (×6): qty 4

## 2017-08-02 MED ORDER — HYDRALAZINE HCL 25 MG PO TABS: 25.0000 mg | ORAL_TABLET | Freq: Two times a day (BID) | ORAL | Status: DC

## 2017-08-02 MED ORDER — SODIUM CHLORIDE 0.9% FLUSH
3.0000 mL | Freq: Two times a day (BID) | INTRAVENOUS | Status: DC
  Administered 2017-08-02 – 2017-08-04 (×4): 3 mL via INTRAVENOUS

## 2017-08-02 MED ORDER — INSULIN ASPART PROT & ASPART (70-30 MIX) 100 UNIT/ML ~~LOC~~ SUSP
60.0000 [IU] | Freq: Two times a day (BID) | SUBCUTANEOUS | Status: DC
  Administered 2017-08-03 (×2): 60 [IU] via SUBCUTANEOUS
  Filled 2017-08-02: qty 10

## 2017-08-02 NOTE — ED Notes (Signed)
EDP at bedside  

## 2017-08-02 NOTE — ED Provider Notes (Signed)
[place] EMERGENCY DEPARTMENT Provider Note   CSN: [id number] Arrival date & time: 08/02/17  1337     History   Chief Complaint Chief Complaint  Patient presents with  . Shortness of Breath  . Dizziness    HPI Martin Bush is a 62 y.o. male.  62 year old male presents with complaint of shortness of breath and feeling lightheaded.  Patient states that he woke up at 7 AM today feeling well, took his normal medications at his regular 11:30 AM time and then ate lunch which included a sloppy Joe sandwich around noon.  Patient states after he had lunch he got up to get his wife and ice pack from the kitchen, after walking into the kitchen he began to feel short of breath and then felt lightheaded.  Patient states that this episode lasted a few seconds and then completely resolved.  Patient states he then had a second similar episode a few minutes later when he went to let the dog out, rechecked his blood sugar and it was in the 200s which is normal for him however he and his wife were concerned about his symptoms today due to his medical history.  Patient states that he has had double bypass as well as 8 stents placed due to the vessels around his heart collapsing.  Patient states that a few years ago he sat up in bed and felt like his blood sugar was dropping, his wife went to get his glucometer and when she came back he was unresponsive and states that he died.  Patient states this episode was due to new diagnosis of congestive heart failure and pneumonia.  States he also has an internal defibrillator.  At this time patient states that he feels well, upon arrival in the emergency room his blood sugar was rechecked and was found to be 68, he is questioning if his glucometer is accurate.  No other complaints or concerns.     Past Medical History:  Diagnosis Date  . Arthritis    hands   . Colon polyps   . Coronary artery disease   . Depression   . Diabetes mellitus  without complication (HCC)    insulin dependent  . High cholesterol   . Hypertension   . MI (myocardial infarction) ([place])   . Proteinuria   . Sleep apnea    uses cpap    Patient Active Problem List   Diagnosis Date Noted  . ICD (implantable cardioverter-defibrillator) discharge 08/02/2017  . Obesity 06/17/2017  . CHF (congestive heart failure) ([place]) 07/23/2015  . Hyperlipidemia 07/11/2015  . Essential hypertension 07/11/2015  . CAD (coronary artery disease) 07/11/2015  . Depression 07/11/2015  . CKD (chronic kidney disease), stage III ([place]) 07/11/2015  . OSA on CPAP 07/11/2015  . Lightheadedness 07/11/2015  . Carotid artery stenosis   . S/P eye surgery 05/15/2015  . Injury of left toe 04/27/2015  . Chronic low back pain 02/13/2015  . Diabetes ([place]) 02/13/2015  . Dizziness 02/05/2015  . NSTEMI (non-ST elevated myocardial infarction) ([place]) 07/16/2014    Past Surgical History:  Procedure Laterality Date  . CARDIAC CATHETERIZATION N/A 07/16/2014   Procedure: Left Heart Cath and Coronary Angiography;  Surgeon: Adrian Prows, MD;  Location: [place] [facility];  Service: Cardiovascular;  Laterality: N/A;  . CARDIAC CATHETERIZATION  07/16/2014   Procedure: Coronary Balloon Angioplasty;  Surgeon: Adrian Prows, MD;  Location: [place] [facility];  Service: Cardiovascular;;  . CARDIAC SURGERY    . JOINT  REPLACEMENT          Home Medications    Prior to Admission medications   Medication Sig Start Date End Date Taking? Authorizing Provider  amiodarone (PACERONE) 200 MG tablet Take 200 mg by mouth daily.  11/19/14  Yes [provider]  aspirin EC 81 MG tablet Take 81 mg by mouth daily.   Yes [provider]  carvedilol (COREG) 12.5 MG tablet Take 6.25 mg by mouth 2 (two) times daily with a meal.   Yes [provider]  Cholecalciferol (VITAMIN D) 2000 units tablet Take 2,000 Units by mouth 2 (two) times daily.   Yes [provider]  empagliflozin  (JARDIANCE) 25 MG TABS tablet Take 25 mg by mouth daily.   Yes [provider]  hydrALAZINE (APRESOLINE) 25 MG tablet Take 25 mg by mouth 2 (two) times daily.   Yes [provider]  hydrocortisone 2.5 % cream Apply topically 2 (two) times daily. Right back for itching Patient taking differently: Apply 1 application topically 2 (two) times daily as needed (itching). Right back for itching 05/27/17  Yes McLean-Scocuzza, Nino Glow, MD  insulin aspart protamine- aspart (NOVOLOG MIX 70/30) (70-30) 100 UNIT/ML injection Inject 60 Units into the skin 2 (two) times daily with a meal.    Yes [provider]  isosorbide mononitrate (IMDUR) 30 MG 24 hr tablet Take 30 mg by mouth daily.   Yes [provider]  Liraglutide (VICTOZA [place]) Inject 1.8 Units into the skin daily.   Yes [provider]  metFORMIN (GLUCOPHAGE) 1000 MG tablet Take 1,000 mg by mouth 2 (two) times daily with a meal.   Yes [provider]  Omega-3 Fatty Acids (FISH OIL) 1000 MG CAPS Take 2,000 mg by mouth 3 (three) times daily.   Yes [provider]  pravastatin (PRAVACHOL) 80 MG tablet Take 80 mg by mouth at bedtime.    Yes [provider]  PRESCRIPTION MEDICATION Inhale into the lungs at bedtime. CPAP   Yes [provider]  sertraline (ZOLOFT) 100 MG tablet Take 50 mg by mouth daily.    Yes [provider]  mupirocin ointment (BACTROBAN) 2 % Apply 1 application topically 2 (two) times daily. To right back Patient not taking: Reported on 08/02/2017 05/27/17   McLean-Scocuzza, Nino Glow, MD    Family History Family History  Problem Relation Age of Onset  . Uterine cancer Mother   . Lung cancer Mother   . Hyperlipidemia Father   . Heart disease Father   . Hypertension Father   . Diabetes Father     Social History Social History   Tobacco Use  . Smoking status: Never Smoker  . Smokeless tobacco: Former Network engineer Use Topics  . Alcohol use: Yes      Comment: OCC  . Drug use: No     Allergies   Diltiazem   Review of Systems Review of Systems  Constitutional: Negative for chills, diaphoresis, fatigue and fever.  Respiratory: Positive for shortness of breath. Negative for cough, chest tightness and wheezing.   Cardiovascular: Negative for chest pain, palpitations and leg swelling.  Gastrointestinal: Negative for abdominal pain, nausea and vomiting.  Genitourinary: Negative for difficulty urinating.  Musculoskeletal: Negative for arthralgias and myalgias.  Skin: Negative for rash and wound.  Neurological: Positive for light-headedness. Negative for weakness.  Hematological: Does not bruise/bleed easily.  Psychiatric/Behavioral: Negative for confusion.  All other systems reviewed and are negative.    Physical Exam Updated Vital  Signs BP 128/75   Pulse 69   Temp 98 F (36.7 C) (Oral)   Resp 20   Ht 5\' 7"  (1.702 m)   Wt 102.1 kg (225 lb)   SpO2 97%   BMI 35.24 kg/m   Physical Exam  Constitutional: He is oriented to person, place, and time. He appears well-developed and well-nourished.  HENT:  Head: Normocephalic and atraumatic.  Eyes: Pupils are equal, round, and reactive to light.  Cardiovascular: Normal rate, regular rhythm, normal heart sounds and intact distal pulses.  Pulmonary/Chest: Effort normal and breath sounds normal.  Abdominal: Soft. He exhibits no distension. There is no tenderness.  Musculoskeletal:       Right lower leg: He exhibits edema.       Left lower leg: He exhibits edema.  Mild pitting edema to lower extremities, R>L  Neurological: He is alert and oriented to person, place, and time.  Skin: Skin is warm and dry. Capillary refill takes less than 2 seconds. No rash noted.  Psychiatric: He has a normal mood and affect. His behavior is normal.  Nursing note and vitals reviewed.    ED Treatments / Results  Labs (all labs ordered are listed, but only abnormal results are displayed) Labs  Reviewed  URINALYSIS, ROUTINE W REFLEX MICROSCOPIC - Abnormal; Notable for the following components:      Result Value   Glucose, UA >=500 (*)    All other components within normal limits  BRAIN NATRIURETIC PEPTIDE - Abnormal; Notable for the following components:   B Natriuretic Peptide 838.4 (*)    All other components within normal limits  TROPONIN I - Abnormal; Notable for the following components:   Troponin I 0.03 (*)    All other components within normal limits  CBG MONITORING, ED - Abnormal; Notable for the following components:   Glucose-Capillary 68 (*)    All other components within normal limits  CBG MONITORING, ED - Abnormal; Notable for the following components:   Glucose-Capillary 64 (*)    All other components within normal limits  BASIC METABOLIC PANEL  CBC  TROPONIN I  CBG MONITORING, ED    EKG EKG Interpretation  Date/Time:  Tuesday August 02 2017 13:40:19 EDT Ventricular Rate:  72 PR Interval:  210 QRS Duration: 108 QT Interval:  438 QTC Calculation: 479 R Axis:   73 Text Interpretation:  Electronic ventricular pacemaker Different morpholoy compared to most recent, but previously noted in paced rhythm No significant change since last tracing Confirmed by Duffy Bruce [phone]) on 08/02/2017 4:16:05 PM   Radiology Dg Chest 2 View  Result Date: 08/02/2017 CLINICAL DATA:  Shortness of breath. EXAM: CHEST - 2 VIEW COMPARISON:  Radiographs of February 05, 2015. FINDINGS: Mild cardiomegaly is noted. Status post coronary artery bypass graft. Left-sided pacemaker is unchanged in position. No pneumothorax or pleural effusion is noted. No acute pulmonary disease is noted. Bony thorax is unremarkable. IMPRESSION: No active cardiopulmonary disease. Electronically Signed   By: Marijo Conception, M.D.   On: 08/02/2017 15:25    Procedures Procedures (including critical care time)  Medications Ordered in ED Medications  aspirin EC tablet 81 mg (has no administration in  time range)  amiodarone (PACERONE) tablet 200 mg (has no administration in time range)  pravastatin (PRAVACHOL) tablet 80 mg (has no administration in time range)  sertraline (ZOLOFT) tablet 50 mg (has no administration in time range)  carvedilol (COREG) tablet 6.25 mg (has no administration in time range)  Vitamin D 2,000 Units (  has no administration in time range)  omega-3 acid ethyl esters (LOVAZA) capsule 2 g (has no administration in time range)  isosorbide mononitrate (IMDUR) 24 hr tablet 30 mg (has no administration in time range)  hydrALAZINE (APRESOLINE) tablet 25 mg (has no administration in time range)  liraglutide (VICTOZA) SOPN 1.8 mg (has no administration in time range)  insulin aspart protamine- aspart (NOVOLOG MIX 70/30) injection 60 Units (has no administration in time range)  canagliflozin (INVOKANA) tablet 100 mg (has no administration in time range)  metFORMIN (GLUCOPHAGE) tablet 1,000 mg (has no administration in time range)  magnesium oxide (MAG-OX) tablet 400 mg (has no administration in time range)  furosemide (LASIX) injection 40 mg (40 mg Intravenous Given 08/02/17 1827)  potassium chloride SA (K-DUR,KLOR-CON) CR tablet 40 mEq (40 mEq Oral Given 08/02/17 1827)     Initial Impression / Assessment and Plan / ED Course  I have reviewed the triage vital signs and the nursing notes.  Pertinent labs & imaging results that were available during my care of the patient were reviewed by me and considered in my medical decision making (see chart for details).  Clinical Course as of Aug 03 2014  Tue Aug 02, 2017  1637 Case discussed with Dr. Einar Gip, patient's cardiologist, recommends Lasix 40mg  Iv, Kdur 53mEq Po, repeat troponin. If patient is feeling better, he may be dc, if not feeling better he can be admitted. Agrees with plan to interrogate device.    [LM]  1505 Call from [employer] rep- patient had multiple episodes of VT today between 12:40-13:08 hours, rate in the 160s  and occasionally into the 170s for 3-4 beats but not long enough to treat at his current setting above 171. Discussed with Dr. Ellender Hose, will consult with Dr. Einar Gip.   [LM]  [phone] Dr. Einar Gip will come and see the patient.    [LM]    Clinical Course User Index [LM] Tacy Learn, PA-C    Final Clinical Impressions(s) / ED Diagnoses   Final diagnoses:  Ventricular tachycardia [facility])  Shortness of breath  Lightheadedness    ED Discharge Orders    None       Roque Lias 08/02/17 2016    Duffy Bruce, MD 08/03/17 [phone]

## 2017-08-02 NOTE — ED Triage Notes (Signed)
Pt. Stated, I started to get up and I get SOB and then dizzy. This has happened 3 times today since 1200. It happen 3 years ago.

## 2017-08-02 NOTE — H&P (Signed)
Martin Bush is an 62 y.o. male.   Chief Complaint: Dyspnea and dizziness HPI: Martin Bush  is a 62 y.o. male  With CAD and history of CABG x2 in 2002 with only LIMA to LAD being patent, last coronary angiography in 2016 he had complex PCI to in-stent restenotic circumflex/OM bifurcation.  He also has left main stenting in 2015.  Circumflex coronary artery is super dominant.  This was done in [facility].  He had been doing well and this evening while he was in the house doing routine chores, suddenly felt like he was going to pass out and also felt short of breath.  The symptoms lasted very brief moment, he felt well and was taking the dog for a short walk and had similar symptoms.  This was very similar to his ICD discharge in the past and also has had history of VF arrest and hence wanted to be checked out and presented to the emergency room.  On further questioning he denies any other symptoms prior to this, denies any recent dyspnea, PND or orthopnea.  Denies chest pain.  Wife present.  Past medical history significant for uncontrolled diabetes, morbid obesity, hyperlipidemia, asymptomatic carotid artery stenosis, hypertension, 27--year history of smoking, he chewed tobacco and smoked cigars, quit in 2002.  Ischemic cardiomyopathy with severe LV systolic dysfunction S/P Bi-V ICD implantation placed in 2016.  Wife present.  Past Medical History:  Diagnosis Date  . Arthritis    hands   . Colon polyps   . Coronary artery disease   . Depression   . Diabetes mellitus without complication (HCC)    insulin dependent  . High cholesterol   . Hypertension   . MI (myocardial infarction) ([place])   . Proteinuria   . Sleep apnea    uses cpap    Past Surgical History:  Procedure Laterality Date  . CARDIAC CATHETERIZATION N/A 07/16/2014   Procedure: Left Heart Cath and Coronary Angiography;  Surgeon: Adrian Prows, MD;  Location: [place] [facility];  Service: Cardiovascular;  Laterality:  N/A;  . CARDIAC CATHETERIZATION  07/16/2014   Procedure: Coronary Balloon Angioplasty;  Surgeon: Adrian Prows, MD;  Location: [place] [facility];  Service: Cardiovascular;;  . CARDIAC SURGERY    . JOINT REPLACEMENT      Family History  Problem Relation Age of Onset  . Uterine cancer Mother   . Lung cancer Mother   . Hyperlipidemia Father   . Heart disease Father   . Hypertension Father   . Diabetes Father    Social History:  reports that he has never smoked. He has quit using smokeless tobacco. He reports that he drinks alcohol. He reports that he does not use drugs.  Allergies:  Allergies  Allergen Reactions  . Diltiazem Rash   Review of Systems  Constitutional: Negative.   HENT: Negative.   Eyes: Negative.   Respiratory: Positive for shortness of breath (Chronic).   Cardiovascular: Negative.   Gastrointestinal: Negative.   Genitourinary: Negative.   Musculoskeletal: Negative.   Skin: Negative.   Neurological: Negative.   Endo/Heme/Allergies: Negative.   Psychiatric/Behavioral: Negative.   All other systems reviewed and are negative.  Blood pressure 128/75, pulse 69, temperature 98 F (36.7 C), temperature source Oral, resp. rate 20, height '5\' 7"'  (1.702 m), weight 102.1 kg (225 lb), SpO2 97 %. Body mass index is 35.24 kg/m.  Physical Exam  Constitutional: He is oriented to person, place, and time. He appears well-developed and well-nourished. No distress.  Moderately obese  HENT:  Head: Normocephalic and atraumatic.  Eyes: Conjunctivae are normal.  Neck: Normal range of motion. Neck supple. No JVD (difficult due to short neck) present. No tracheal deviation present. No thyromegaly present.  Cardiovascular: Normal rate, regular rhythm and normal heart sounds. Exam reveals no gallop and no friction rub.  No murmur heard. Distant heart sounds.  Bilateral femorals well felt, popliteals 1+, absent pedal pulses.  Bilateral carotid artery bruit present.  Pulmonary/Chest:  Effort normal. No respiratory distress. He has rales (Bibasilar crackles).  Abdominal: Soft. Bowel sounds are normal.  Small pannus present, ventral hernia, reducible present.  Musculoskeletal: Normal range of motion. He exhibits no edema, tenderness or deformity.  Neurological: He is alert and oriented to person, place, and time.  Skin: Skin is warm and dry. He is not diaphoretic.  Psychiatric: He has a normal mood and affect.    Results for orders placed or performed during the hospital encounter of 08/02/17 (from the past 48 hour(s))  CBG monitoring, ED     Status: Abnormal   Collection Time: 08/02/17  1:51 PM  Result Value Ref Range   Glucose-Capillary 68 (L) 70 - 99 mg/dL  Basic metabolic panel     Status: None   Collection Time: 08/02/17  1:57 PM  Result Value Ref Range   Sodium 140 135 - 145 mmol/L   Potassium 3.6 3.5 - 5.1 mmol/L   Chloride 106 98 - 111 mmol/L    Comment: Please note change in reference range.   CO2 25 22 - 32 mmol/L   Glucose, Bld 79 70 - 99 mg/dL    Comment: Please note change in reference range.   BUN 14 8 - 23 mg/dL    Comment: Please note change in reference range.   Creatinine, Ser 1.19 0.61 - 1.24 mg/dL   Calcium 9.1 8.9 - 10.3 mg/dL   GFR calc non Af Amer >60 >60 mL/min   GFR calc Af Amer >60 >60 mL/min    Comment: (NOTE) The eGFR has been calculated using the CKD EPI equation. This calculation has not been validated in all clinical situations. eGFR's persistently <60 mL/min signify possible Chronic Kidney Disease.    Anion gap 9 5 - 15    Comment: Performed at [facility] [address] [place] [address]  CBC     Status: None   Collection Time: 08/02/17  1:57 PM  Result Value Ref Range   WBC 7.2 4.0 - 10.5 K/uL   RBC 4.64 4.22 - 5.81 MIL/uL   Hemoglobin 13.5 13.0 - 17.0 g/dL   HCT 44.3 39.0 - 52.0 %   MCV 95.5 78.0 - 100.0 fL   MCH 29.1 26.0 - 34.0 pg   MCHC 30.5 30.0 - 36.0 g/dL   RDW 13.2 11.5 - 15.5 %   Platelets  193 150 - 400 K/uL    Comment: Performed at [facility] [address], [place], [place] [address]  Brain natriuretic peptide     Status: Abnormal   Collection Time: 08/02/17  1:57 PM  Result Value Ref Range   B Natriuretic Peptide 838.4 (H) 0.0 - 100.0 pg/mL    Comment: Performed at [facility] [address], [place], [place] [address]  Troponin I     Status: Abnormal   Collection Time: 08/02/17  1:57 PM  Result Value Ref Range   Troponin I 0.03 (HH) <0.03 ng/mL    Comment: CRITICAL RESULT CALLED  TO, READ BACK BY AND VERIFIED WITH: Melida Gimenez [id number] 08/02/2017 WBOND Performed at [facility], [place] [address], [place], [place] [address]   CBG monitoring, ED     Status: None   Collection Time: 08/02/17  2:08 PM  Result Value Ref Range   Glucose-Capillary 81 70 - 99 mg/dL   Comment 1 Notify RN   Urinalysis, Routine w reflex microscopic     Status: Abnormal   Collection Time: 08/02/17  2:44 PM  Result Value Ref Range   Color, Urine YELLOW YELLOW   APPearance CLEAR CLEAR   Specific Gravity, Urine 1.012 1.005 - 1.030   pH 5.0 5.0 - 8.0   Glucose, UA >=500 (A) NEGATIVE mg/dL   Hgb urine dipstick NEGATIVE NEGATIVE   Bilirubin Urine NEGATIVE NEGATIVE   Ketones, ur NEGATIVE NEGATIVE mg/dL   Protein, ur NEGATIVE NEGATIVE mg/dL   Nitrite NEGATIVE NEGATIVE   Leukocytes, UA NEGATIVE NEGATIVE   RBC / HPF 0-5 0 - 5 RBC/hpf   WBC, UA 0-5 0 - 5 WBC/hpf   Bacteria, UA NONE SEEN NONE SEEN    Comment: Performed at [facility], [place] [address], [place], [place] [address]  CBG monitoring, ED     Status: Abnormal   Collection Time: 08/02/17  5:39 PM  Result Value Ref Range   Glucose-Capillary 64 (L) 70 - 99 mg/dL    Labs:   Lab Results  Component Value Date   WBC 7.2 08/02/2017   HGB 13.5 08/02/2017   HCT 44.3 08/02/2017   MCV 95.5 08/02/2017   PLT 193 08/02/2017   Recent Labs  Lab 08/02/17 1357  NA 140  K 3.6  CL 106  CO2 25  BUN 14  CREATININE 1.19   CALCIUM 9.1  GLUCOSE 79    Lipid Panel     Component Value Date/Time   CHOL 145 07/16/2014 0310   TRIG 223 (H) 07/16/2014 0310   HDL 26 (L) 07/16/2014 0310   CHOLHDL 5.6 07/16/2014 0310   VLDL 45 (H) 07/16/2014 0310   LDLCALC 74 07/16/2014 0310    BNP (last 3 results) Recent Labs    08/02/17 1357  BNP 838.4*    HEMOGLOBIN A1C Lab Results  Component Value Date   HGBA1C 9.3 (H) 05/26/2016   MPG 263 07/12/2015    Cardiac Panel (last 3 results) Recent Labs    08/02/17 1357  TROPONINI 0.03*    Current Facility-Administered Medications:  .  [START ON 08/03/2017] amiodarone (PACERONE) tablet 200 mg, 200 mg, Oral, Daily, Adrian Prows, MD .  Derrill Memo ON 08/03/2017] aspirin EC tablet 81 mg, 81 mg, Oral, Daily, Adrian Prows, MD .  Derrill Memo ON 08/03/2017] canagliflozin [facility]) tablet 100 mg, 100 mg, Oral, QAC breakfast, Adrian Prows, MD .  Derrill Memo ON 08/03/2017] carvedilol (COREG) tablet 6.25 mg, 6.25 mg, Oral, BID WC, Adrian Prows, MD .  hydrALAZINE (APRESOLINE) tablet 25 mg, 25 mg, Oral, BID, Adrian Prows, MD .  Derrill Memo ON 08/03/2017] insulin aspart protamine- aspart (NOVOLOG MIX 70/30) injection 60 Units, 60 Units, Subcutaneous, BID WC, Adrian Prows, MD .  Derrill Memo ON 08/03/2017] isosorbide mononitrate (IMDUR) 24 hr tablet 30 mg, 30 mg, Oral, Daily, Adrian Prows, MD .  liraglutide (VICTOZA) SOPN 1.8 mg, 1.8 mg, Subcutaneous, Daily, Adrian Prows, MD .  magnesium oxide (MAG-OX) tablet 400 mg, 400 mg, Oral, BID, Adrian Prows, MD .  Derrill Memo ON 08/03/2017] metFORMIN (GLUCOPHAGE) tablet 1,000 mg, 1,000 mg, Oral, BID WC, Adrian Prows, MD .  omega-3  acid ethyl esters (LOVAZA) capsule 2 g, 2 g, Oral, BID, Adrian Prows, MD .  pravastatin (PRAVACHOL) tablet 80 mg, 80 mg, Oral, QHS, Adrian Prows, MD .  Derrill Memo ON 08/03/2017] sertraline (ZOLOFT) tablet 50 mg, 50 mg, Oral, Daily, Adrian Prows, MD .  Vitamin D 2,000 Units, 2,000 Units, Oral, BID, Adrian Prows, MD  Current Outpatient Medications:  .  amiodarone (PACERONE) 200 MG  tablet, Take 200 mg by mouth daily. , Disp: , Rfl:  .  aspirin EC 81 MG tablet, Take 81 mg by mouth daily., Disp: , Rfl:  .  carvedilol (COREG) 12.5 MG tablet, Take 6.25 mg by mouth 2 (two) times daily with a meal., Disp: , Rfl:  .  Cholecalciferol (VITAMIN D) 2000 units tablet, Take 2,000 Units by mouth 2 (two) times daily., Disp: , Rfl:  .  empagliflozin (JARDIANCE) 25 MG TABS tablet, Take 25 mg by mouth daily., Disp: , Rfl:  .  hydrALAZINE (APRESOLINE) 25 MG tablet, Take 25 mg by mouth 2 (two) times daily., Disp: , Rfl:  .  hydrocortisone 2.5 % cream, Apply topically 2 (two) times daily. Right back for itching (Patient taking differently: Apply 1 application topically 2 (two) times daily as needed (itching). Right back for itching), Disp: 30 g, Rfl: 1 .  insulin aspart protamine- aspart (NOVOLOG MIX 70/30) (70-30) 100 UNIT/ML injection, Inject 60 Units into the skin 2 (two) times daily with a meal. , Disp: , Rfl:  .  isosorbide mononitrate (IMDUR) 30 MG 24 hr tablet, Take 30 mg by mouth daily., Disp: , Rfl:  .  Liraglutide (VICTOZA [place]), Inject 1.8 Units into the skin daily., Disp: , Rfl:  .  metFORMIN (GLUCOPHAGE) 1000 MG tablet, Take 1,000 mg by mouth 2 (two) times daily with a meal., Disp: , Rfl:  .  Omega-3 Fatty Acids (FISH OIL) 1000 MG CAPS, Take 2,000 mg by mouth 3 (three) times daily., Disp: , Rfl:  .  pravastatin (PRAVACHOL) 80 MG tablet, Take 80 mg by mouth at bedtime. , Disp: , Rfl:  .  PRESCRIPTION MEDICATION, Inhale into the lungs at bedtime. CPAP, Disp: , Rfl:  .  sertraline (ZOLOFT) 100 MG tablet, Take 50 mg by mouth daily. , Disp: , Rfl:  .  mupirocin ointment (BACTROBAN) 2 %, Apply 1 application topically 2 (two) times daily. To right back (Patient not taking: Reported on 08/02/2017), Disp: 30 g, Rfl: 0  CARDIAC STUDIES: EKG 08/02/2017: Sinus rhythm with first-degree AV block at the rate of 72 bpm, biventricular pacemaker detected.  No further analysis.  Outpatient echocardiogram  08/31/2016: Poor echo window, moderately dilated LV at 6.3 cm, severe LV systolic dysfunction with inferior wall akinesis and mild global hypokinesis, EF 35 to 40%.  Carotid artery duplex 03/03/2017: Right ICA stenosis of greater than 70%.  Left ICA stenosis of 50 to 69%.  Antegrade vertebral flow.  No change since 08/31/2016.    Coronary angiogram 07/16/2014: Occluded SVG to dominant circumflex, LAD occluded in proximal segment, patent LIMA to LAD.  RCA nondominant.  Multiple stents in the proximal and mid circumflex and also large OM1.  Complex angioplasty to in-stent restenotic OM1.  Assessment/Plan 1.  Acute on chronic systolic and diastolic heart failure 2.  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 3.  NSVT, patient has had brief NSVT episodes today, longest duration 7 seconds, symptomatic with dizziness and shortness of breath.  No therapy.  ICD interrogation, remote unscheduled reviewed and updated.  Device function  normal with normal impedance and lead thresholds.  CHF evident.  Brief nonsustained VT, longest duration 7 seconds, fastest 194 bpm. 4.  Diabetes mellitus type 2 uncontrolled with stage III chronic kidney disease 5.  Ischemic cardiomyopathy with moderate to severe LV systolic dysfunction, EF 35 to 40%. 6.  Mixed hyperlipidemia 7.  Obstructive sleep apnea on CPAP 8.  Asymptomatic bilateral carotid artery stenosis 9.  Hypertension  Recommendation: Patient will be admitted to the hospital for management of acute CHF.  We will trend his troponin, if troponins are positive he may need repeat coronary angiography otherwise his episodes of NSVT is related to acute decompensated heart failure and natural history.  We will continue to use CPAP while he is in the hospital, continue antihyperlipidemia therapy, will continue to reiterate weight loss and better diabetes control.  He has outpatient carotid duplex scheduled.  Blood pressure is well  controlled.  I will obtain EP consultation in the morning.  Adrian Prows, MD 08/02/2017, 8:16 PM [facility]. [place] Pager: [phone] Office: [phone] If no answer: Cell:  [phone]

## 2017-08-02 NOTE — ED Notes (Signed)
Call made to lab- states they can add on troponin and bnp

## 2017-08-02 NOTE — ED Triage Notes (Signed)
Pt's glucometer reads 212, around 1330,ED meter reads 68.

## 2017-08-02 NOTE — ED Notes (Signed)
Pt back from X-ray.  

## 2017-08-02 NOTE — Progress Notes (Signed)
Pt will place cpap on/off when ready but is aware to let staff know if he needs assistance. RT will monitor.

## 2017-08-02 NOTE — ED Provider Notes (Signed)
Patient placed in Quick Look pathway, seen and evaluated   Chief Complaint: sob, dizzy  HPI:   Having dizzy/lightheadedness sensation along with SOB 1 hr ago.  Hx cardiac arrest  ROS: no chest pain, no fever or productive cough (one)  Physical Exam:   Gen: No distress  Neuro: Awake and Alert  Skin: Warm    Focused Exam: Heart: RRR no M/R/G, Chest: midline sternotomy. Lungs: CTAB, no W/R/R. Abd: soft, no abdominal bruits   Initiation of care has begun. The patient has been counseled on the process, plan, and necessity for staying for the completion/evaluation, and the remainder of the medical screening examination    Domenic Moras, Hershal Coria 08/02/17 [place], MD 08/04/17 [phone]

## 2017-08-03 ENCOUNTER — Inpatient Hospital Stay (HOSPITAL_COMMUNITY): Payer: No Typology Code available for payment source

## 2017-08-03 DIAGNOSIS — Z4502 Encounter for adjustment and management of automatic implantable cardiac defibrillator: Secondary | ICD-10-CM

## 2017-08-03 DIAGNOSIS — Z8679 Personal history of other diseases of the circulatory system: Secondary | ICD-10-CM

## 2017-08-03 DIAGNOSIS — I472 Ventricular tachycardia, unspecified: Secondary | ICD-10-CM

## 2017-08-03 LAB — TROPONIN I
Troponin I: 0.04 ng/mL (ref <0.03)
Troponin I: 0.06 ng/mL (ref <0.03)

## 2017-08-03 LAB — GLUCOSE, CAPILLARY
Glucose-Capillary: 164 mg/dL — ABNORMAL HIGH (ref 70–99)
Glucose-Capillary: 34 mg/dL — CL (ref 70–99)
Glucose-Capillary: 95 mg/dL (ref 70–99)

## 2017-08-03 LAB — BASIC METABOLIC PANEL
Anion gap: 12 (ref 5–15)
BUN: 17 mg/dL (ref 8–23)
CO2: 24 mmol/L (ref 22–32)
Calcium: 8.9 mg/dL (ref 8.9–10.3)
Chloride: 103 mmol/L (ref 98–111)
Creatinine, Ser: 1.15 mg/dL (ref 0.61–1.24)
GFR calc Af Amer: >60 mL/min (ref >60)
GFR calc non Af Amer: >60 mL/min (ref >60)
Glucose, Bld: 136 mg/dL — ABNORMAL HIGH (ref 70–99)
Potassium: 3.8 mmol/L (ref 3.5–5.1)
Sodium: 139 mmol/L (ref 135–145)

## 2017-08-03 LAB — BRAIN NATRIURETIC PEPTIDE: B Natriuretic Peptide: 579.4 pg/mL — ABNORMAL HIGH (ref 0.0–100.0)

## 2017-08-03 LAB — HIV ANTIBODY (ROUTINE TESTING W REFLEX): HIV Screen 4th Generation wRfx: NONREACTIVE

## 2017-08-03 MED ORDER — METOPROLOL TARTRATE 5 MG/5ML IV SOLN
5.0000 mg | Freq: Once | INTRAVENOUS | Status: AC
  Administered 2017-08-03: 5 mg via INTRAVENOUS
  Filled 2017-08-03: qty 5

## 2017-08-03 MED ORDER — AMIODARONE HCL 200 MG PO TABS
200.0000 mg | ORAL_TABLET | Freq: Two times a day (BID) | ORAL | Status: DC
  Administered 2017-08-03 – 2017-08-05 (×6): 200 mg via ORAL
  Filled 2017-08-03 (×6): qty 1

## 2017-08-03 MED ORDER — PERFLUTREN LIPID MICROSPHERE
4.0000 mL | INTRAVENOUS | Status: AC | PRN
Start: 2017-08-03 — End: 2017-08-03
  Filled 2017-08-03: qty 4

## 2017-08-03 MED ORDER — PERFLUTREN LIPID MICROSPHERE
INTRAVENOUS | Status: AC
  Filled 2017-08-03: qty 10

## 2017-08-03 MED ORDER — SODIUM CHLORIDE 0.9 % IV SOLN
INTRAVENOUS | Status: DC
  Administered 2017-08-04: 06:00:00 via INTRAVENOUS

## 2017-08-03 MED ORDER — METOPROLOL TARTRATE 5 MG/5ML IV SOLN: 25.0000 mg | Freq: Once | INTRAVENOUS | Status: DC

## 2017-08-03 MED ORDER — SODIUM CHLORIDE 0.9 % IV SOLN: 250.0000 mL | INTRAVENOUS | Status: DC | PRN

## 2017-08-03 MED ORDER — DIGOXIN 125 MCG PO TABS
0.1250 mg | ORAL_TABLET | Freq: Every day | ORAL | Status: DC
  Administered 2017-08-03 – 2017-08-04 (×3): 0.125 mg via ORAL
  Filled 2017-08-03 (×3): qty 1

## 2017-08-03 MED ORDER — SODIUM CHLORIDE 0.9% FLUSH: 3.0000 mL | Freq: Two times a day (BID) | INTRAVENOUS | Status: DC

## 2017-08-03 MED ORDER — DEXTROSE 50 % IV SOLN
INTRAVENOUS | Status: AC
  Administered 2017-08-03: 21:00:00
  Filled 2017-08-03: qty 50

## 2017-08-03 MED ORDER — SODIUM CHLORIDE 0.9% FLUSH: 3.0000 mL | INTRAVENOUS | Status: DC | PRN

## 2017-08-03 MED ORDER — METOPROLOL TARTRATE 25 MG PO TABS
25.0000 mg | ORAL_TABLET | Freq: Three times a day (TID) | ORAL | Status: DC
  Administered 2017-08-04 – 2017-08-05 (×3): 25 mg via ORAL
  Filled 2017-08-03 (×3): qty 1

## 2017-08-03 NOTE — Progress Notes (Signed)
Converted back to paced rhythm

## 2017-08-03 NOTE — Progress Notes (Signed)
Pt. Back in SVT.

## 2017-08-03 NOTE — Consult Note (Addendum)
ELECTROPHYSIOLOGY CONSULT NOTE    Patient ID: Martin Bush MRN: [id number], DOB/AGE: 62-May-1957 62 y.o.  Admit date: 08/02/2017 Date of Consult: 08/03/2017  Primary Physician: [facility] Primary Cardiologist: Einar Gip Electrophysiologist: Lovena Le  Patient Profile: Martin Bush is a 62 y.o. male with a history of CAD s/p CABG, prior VF arrest s/p MDT CRTD, diabetes, OSA who is being seen today for the evaluation of VT at the request of Dr Einar Gip.  HPI:  Martin Bush is a 62 y.o. male with the above past medical history. His device was implanted at [place] after cardiac arrest where his wife did CPR.  He received a MDT CRTD 10/2014.  He was started on amiodarone at that time and has done well since from an arrhythmia standpoint. He moved to [place] after retirement and has established with Dr Einar Gip. He developed periods of shortness of breath and lightheadedness over the last 2 days and presented to the ER for evaluation. Device interrogation demonstrated VT below detection and he was admitted for further evaluation. Amiodarone has been increased to 200mg  twice daily. He currently feels well but has had recurrent VT overnight and this morning at a rate of around 140bpm.  EP has been asked to evaluate for treatment options.   He denies chest pain, PND, orthopnea, nausea, vomiting, syncope, edema, weight gain, or early satiety.  Past Medical History:  Diagnosis Date  . AICD (automatic cardioverter/defibrillator) present 2016  . Arthritis    "thumbs"  (08/02/2017)  . CHF (congestive heart failure) ([place])   . Colon polyps   . Coronary artery disease   . Depression   . High cholesterol   . Hypertension   . MI (myocardial infarction) ([place]) 2003  . OSA on CPAP   . Pneumonia 10/2014  . Proteinuria   . Type II diabetes mellitus ([facility])    insulin dependent     Surgical History:  Past Surgical History:  Procedure Laterality Date  . CARDIAC CATHETERIZATION N/A 07/16/2014     Procedure: Left Heart Cath and Coronary Angiography;  Surgeon: Adrian Prows, MD;  Location: [facility];  Service: Cardiovascular;  Laterality: N/A;  . CARDIAC CATHETERIZATION  07/16/2014   Procedure: Coronary Balloon Angioplasty;  Surgeon: Adrian Prows, MD;  Location: [facility];  Service: Cardiovascular;;  . CARDIAC CATHETERIZATION  2003  . CARDIAC DEFIBRILLATOR PLACEMENT  2016  . CATARACT EXTRACTION W/ INTRAOCULAR LENS IMPLANT Left 03/2014  . CORONARY ANGIOPLASTY WITH STENT PLACEMENT     "I've got a total of 8 stents in there; mostly doine at [facility]" (08/02/2017)  . CORONARY ARTERY BYPASS GRAFT  ~ 2003   "CABG X2"; [facility]  . ELBOW SURGERY Left ?2001   "pinched nerve"     Medications Prior to Admission  Medication Sig Dispense Refill Last Dose  . amiodarone (PACERONE) 200 MG tablet Take 200 mg by mouth daily.    08/02/2017 at Unknown time  . aspirin EC 81 MG tablet Take 81 mg by mouth daily.   08/02/2017 at Unknown time  . carvedilol (COREG) 12.5 MG tablet Take 6.25 mg by mouth 2 (two) times daily with a meal.   08/02/2017 at 1130am  . Cholecalciferol (VITAMIN D) 2000 units tablet Take 2,000 Units by mouth 2 (two) times daily.   08/02/2017 at am  . empagliflozin (JARDIANCE) 25 MG TABS tablet Take 25 mg by mouth daily.   08/02/2017 at Unknown time  . hydrALAZINE (APRESOLINE) 25 MG tablet Take 25 mg by  mouth 2 (two) times daily.   08/02/2017 at am  . hydrocortisone 2.5 % cream Apply topically 2 (two) times daily. Right back for itching (Patient taking differently: Apply 1 application topically 2 (two) times daily as needed (itching). Right back for itching) 30 g 1 unk at prn  . insulin aspart protamine- aspart (NOVOLOG MIX 70/30) (70-30) 100 UNIT/ML injection Inject 60 Units into the skin 2 (two) times daily with a meal.    08/02/2017 at am  . isosorbide mononitrate (IMDUR) 30 MG 24 hr tablet Take 30 mg by mouth daily.   08/02/2017 at Unknown time  . Liraglutide (VICTOZA [place]) Inject  1.8 Units into the skin daily.   08/02/2017 at Unknown time  . metFORMIN (GLUCOPHAGE) 1000 MG tablet Take 1,000 mg by mouth 2 (two) times daily with a meal.   08/02/2017 at am  . Omega-3 Fatty Acids (FISH OIL) 1000 MG CAPS Take 2,000 mg by mouth 3 (three) times daily.   08/02/2017 at am  . pravastatin (PRAVACHOL) 80 MG tablet Take 80 mg by mouth at bedtime.    08/01/2017 at Unknown time  . PRESCRIPTION MEDICATION Inhale into the lungs at bedtime. CPAP   08/01/2017 at Unknown time  . sertraline (ZOLOFT) 100 MG tablet Take 50 mg by mouth daily.    08/02/2017 at Unknown time  . mupirocin ointment (BACTROBAN) 2 % Apply 1 application topically 2 (two) times daily. To right back (Patient not taking: Reported on 08/02/2017) 30 g 0 Completed Course at Unknown time    Inpatient Medications:  . amiodarone  200 mg Oral BID  . aspirin EC  81 mg Oral Daily  . canagliflozin  100 mg Oral QAC breakfast  . cholecalciferol  2,000 Units Oral BID  . digoxin  0.125 mg Oral Daily  . furosemide  40 mg Intravenous 3 times per day  . heparin  5,000 Units Subcutaneous Q8H  . hydrALAZINE  25 mg Oral Q8H  . insulin aspart protamine- aspart  60 Units Subcutaneous BID WC  . isosorbide mononitrate  30 mg Oral Daily  . magnesium oxide  400 mg Oral BID  . metFORMIN  1,000 mg Oral BID WC  . [START ON 08/04/2017] metoprolol tartrate  25 mg Oral TID  . omega-3 acid ethyl esters  2 g Oral BID  . pravastatin  80 mg Oral QHS  . sertraline  50 mg Oral Daily  . sodium chloride flush  3 mL Intravenous Q12H    Allergies:  Allergies  Allergen Reactions  . Diltiazem Rash    Social History   Socioeconomic History  . Marital status: Married    Spouse name: Not on file  . Number of children: Not on file  . Years of education: Not on file  . Highest education level: Not on file  Occupational History  . Not on file  Social Needs  . Financial resource strain: Not hard at all  . Food insecurity:    Worry: Never true    Inability:  Never true  . Transportation needs:    Medical: No    Non-medical: No  Tobacco Use  . Smoking status: Never Smoker  . Smokeless tobacco: Former Network engineer and Sexual Activity  . Alcohol use: Yes    Comment: 08/02/2017 "maybe 1 drink/year"  . Drug use: Never  . Sexual activity: Not Currently  Lifestyle  . Physical activity:    Days per week: 0 days    Minutes per session: Not on file  .  Stress: Not on file  Relationships  . Social connections:    Talks on phone: Not on file    Gets together: Not on file    Attends religious service: Not on file    Active member of club or organization: Not on file    Attends meetings of clubs or organizations: Not on file    Relationship status: Not on file  . Intimate partner violence:    Fear of current or ex partner: No    Emotionally abused: No    Physically abused: No    Forced sexual activity: No  Other Topics Concern  . Not on file  Social History Narrative  . Not on file     Family History  Problem Relation Age of Onset  . Uterine cancer Mother   . Lung cancer Mother   . Hyperlipidemia Father   . Heart disease Father   . Hypertension Father   . Diabetes Father      Review of Systems: All other systems reviewed and are otherwise negative except as noted above.  Physical Exam: Vitals:   08/02/17 2337 08/03/17 0100 08/03/17 0303 08/03/17 0723  BP: 139/86 (!) 142/88 134/84 (!) 132/93  Pulse: 79  79 75  Resp: 16  20   Temp: 98.4 F (36.9 C)  98.4 F (36.9 C) 98 F (36.7 C)  TempSrc: Oral  Oral Oral  SpO2: 95%  93% 97%  Weight:   219 lb 8 oz (99.6 kg)   Height:        GEN- The patient is well appearing, alert and oriented x 3 today.   HEENT: normocephalic, atraumatic; sclera clear, conjunctiva pink; hearing intact; oropharynx clear; neck supple Lungs- Clear to ausculation bilaterally, normal work of breathing.  No wheezes, rales, rhonchi Heart- Regular rate and rhythm  GI- soft, non-tender, non-distended, bowel  sounds present Extremities- no clubbing, cyanosis, or edema  MS- no significant deformity or atrophy Skin- warm and dry, no rash or lesion Psych- euthymic mood, full affect Neuro- strength and sensation are intact  Labs:   Lab Results  Component Value Date   WBC 8.5 08/02/2017   HGB 14.1 08/02/2017   HCT 44.7 08/02/2017   MCV 94.3 08/02/2017   PLT 219 08/02/2017    Recent Labs  Lab 08/03/17 0449  NA 139  K 3.8  CL 103  CO2 24  BUN 17  CREATININE 1.15  CALCIUM 8.9  GLUCOSE 136*      Radiology/Studies: Dg Chest 2 View  Result Date: 08/02/2017 CLINICAL DATA:  Shortness of breath. EXAM: CHEST - 2 VIEW COMPARISON:  Radiographs of February 05, 2015. FINDINGS: Mild cardiomegaly is noted. Status post coronary artery bypass graft. Left-sided pacemaker is unchanged in position. No pneumothorax or pleural effusion is noted. No acute pulmonary disease is noted. Bony thorax is unremarkable. IMPRESSION: No active cardiopulmonary disease. Electronically Signed   By: Marijo Conception, M.D.   On: 08/02/2017 15:25    EKG: presenting - sinus rhythm with LV pacing VT, rate 139 (personally reviewed)  TELEMETRY: sinus rhythm with LV pacing, intermittent VT (personally reviewed)  DEVICE HISTORY: MDT CRTD implanted 10/2014 for secondary prevention (RA - 5076, RV - 6935M, LV - 4598)  Assessment/Plan: 1.  Symptomatic ventricular tachycardia VT currently below device detection Will reprogram to allow for ATP only therapy in VT zone - NID extended as well today to allow for self termination. FVT zone added, VF zone increased  Keep K >3.9, Mg >1.8 He hasn't  had invasive ischemic eval since 2016 - discussed with Dr Einar Gip, will plan cath this admission Continue amiodarone 200mg  twice daily (was on daily at home) for 4 weeks then decrease back to 200mg  daily He likely has a scar related VT - as it is relatively well tolerated, could consider VT ablation if he has recurrence. No driving x6 months (pt  aware)  2.  CAD s/p CABG Plan for cath this admission with Dr Einar Gip  3.  Chronic systolic heart failure Per Dr Einar Gip  Follow up with Dr Lovena Le in 6 weeks (appt scheduled)  Signed, Chanetta Marshall, NP 08/03/2017 9:23 AM   EP Attending  Patient seen and examined. Agree with above. The patient is a pleasant 62 yo man with an ICM, s/p CABG, CAD with multiple PCI's who is s/p biv ICD remotely. He had done well but noted worsening sob over the past few weeks. He has developed VT below his device detection. He denies medical non-compliance. The patient got dizzy and lightheaded with his VT but did not have syncope. In light of a change in symptoms along with recurrent VT, I would suggest that he undergo cardiac cath to make sure that he does not need PCI. Would uptitrate his dose of amio to 400 mg daily for a month. I am more than happy to see him in followup as needed.   Mikle Bosworth.D.

## 2017-08-03 NOTE — Progress Notes (Signed)
Pt. back in SVT. No call back received from MD.

## 2017-08-03 NOTE — H&P (View-Only) (Signed)
Subjective:  Feels tired, No syncope or chest pain, no PND or orthopnea.   Objective:  Vital Signs in the last 24 hours: Temp:  [98 F (36.7 C)-98.4 F (36.9 C)] 98.2 F (36.8 C) (07/10 1218) Pulse Rate:  [68-79] 68 (07/10 1218) Resp:  [16-20] 20 (07/10 0303) BP: (118-147)/(75-98) 130/84 (07/10 1305) SpO2:  [93 %-97 %] 96 % (07/10 1218) Weight:  [99.6 kg (219 lb 8 oz)-102.1 kg (225 lb)] 99.6 kg (219 lb 8 oz) (07/10 0303)  Intake/Output from previous day: 07/09 0701 - 07/10 0700 In: -  Out: 500 [Urine:500]  Physical Exam:  General appearance: alert, no distress and moderately obese Eyes: negative findings: lids and lashes normal Neck: no adenopathy, supple, symmetrical, trachea midline and thyroid not enlarged, symmetric, no tenderness/mass/nodules Neck: JVP - normal, carotids 2+= without bruits Resp: clear to auscultation bilaterally Chest wall: no tenderness Cardio: regular rate and rhythm, S1, S2 normal, no murmur, click, rub or gallop and distant heart sounds GI: soft, non-tender; bowel sounds normal; no masses,  no organomegaly and Obese with pannus. Grossly normal Extremities: extremities normal, atraumatic, no cyanosis or edema    Lab Results: BMP Recent Labs    08/02/17 1357 08/02/17 2219 08/03/17 0449  NA 140  --  139  K 3.6  --  3.8  CL 106  --  103  CO2 25  --  24  GLUCOSE 79  --  136*  BUN 14  --  17  CREATININE 1.19 1.17 1.15  CALCIUM 9.1  --  8.9  GFRNONAA >60 >60 >60  GFRAA >60 >60 >60    CBC Recent Labs  Lab 08/02/17 2219  WBC 8.5  RBC 4.74  HGB 14.1  HCT 44.7  PLT 219  MCV 94.3  MCH 29.7  MCHC 31.5  RDW 13.4    HEMOGLOBIN A1C Lab Results  Component Value Date   HGBA1C 9.3 (H) 05/26/2016   MPG 263 07/12/2015    Cardiac Panel (last 3 results) Recent Labs    08/02/17 2219 08/03/17 0449 08/03/17 0746  TROPONINI 0.03* 0.04* 0.06*    BNP (last 3 results) No results for input(s): PROBNP in the last 8760 hours.  TSH No  results for input(s): TSH in the last 8760 hours.  Lipid Panel     Component Value Date/Time   CHOL 145 07/16/2014 0310   TRIG 223 (H) 07/16/2014 0310   HDL 26 (L) 07/16/2014 0310   CHOLHDL 5.6 07/16/2014 0310   VLDL 45 (H) 07/16/2014 0310   LDLCALC 74 07/16/2014 0310     Hepatic Function Panel No results for input(s): PROT, ALBUMIN, AST, ALT, ALKPHOS, BILITOT, BILIDIR, IBILI in the last 8760 hours.  Imaging: Dg Chest 2 View  Result Date: 08/02/2017 CLINICAL DATA:  Shortness of breath. EXAM: CHEST - 2 VIEW COMPARISON:  Radiographs of February 05, 2015. FINDINGS: Mild cardiomegaly is noted. Status post coronary artery bypass graft. Left-sided pacemaker is unchanged in position. No pneumothorax or pleural effusion is noted. No acute pulmonary disease is noted. Bony thorax is unremarkable. IMPRESSION: No active cardiopulmonary disease. Electronically Signed   By: Marijo Conception, M.D.   On: 08/02/2017 15:25    Cardiac Studies:  EKG: Bi-V paced rhythm  Tele: Sustained VT last night rate 140/min. ICD parameters changed this morning for VF/VT detection rate,   ECHO: dilated LV at 6.3 cm, severe LV systolic dysfunction, EF 79%, grade 2 systolic dysfunction suggested, global hypokinesis with possible inferolateral akinesis.  Mild TR with moderate  pulmonary hypertension.  Assessment/Plan:  1.  Acute on chronic systolic and diastolic heart failure 2.  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 3. Sustained VT,  No therapy.  ICD interrogation, remote unscheduled reviewed and updated. Appreciate EP consult and change in parameters to detect VT (slow VT probably due to Amiodarone).  Device function normal with normal impedance and lead thresholds.  CHF evident.  4.  Diabetes mellitus type 2 uncontrolled with stage III chronic kidney disease, renal function appears to have normalized from previous. 5.  Ischemic cardiomyopathy with  severe LV systolic  dysfunction, EF 20% compared to previous in Oct 2018 35 to 40%. 6.  Mixed hyperlipidemia 7.  Obstructive sleep apnea on CPAP 8.  Hypertension  Rec: In view of change in EF and sustained VT, needs coronary angiogram. Will set it up for tomorrow. Discussed risks, benefits and alternatives of angiogram including but not limited to <1% risk of death, stroke, MI, need for urgent surgical revascularization, renal failure, but not limited to thest. patient is willing to proceed. Will add Entresto after angiogram if renal function remains stable.  Continue statins and present meds for DM and HTN.     Adrian Prows, M.D. 08/03/2017, 4:09 PM [facility] Pager: [phone] Office: [phone] If no answer: [phone]

## 2017-08-03 NOTE — Progress Notes (Signed)
Patient's wife called out and said that her husband was crashing, and when I went into his room, he was diaphoretic, lightheaded, dizzy and started to become lethargic, blood sugar was 34, gave 1/2 amp D50 and text paged Cardiology with what had happened, within 15-20 minutes he started to look much better, eating graham crackers with peanut butte, repeat blood  sugar was 95 at this time, pt was given a [place] sandwich, will recheck his BS again around 2AM since his wife said that's when it tends to drop low and will continue to monitor, no orders received.

## 2017-08-03 NOTE — Progress Notes (Signed)
Subjective:  Feels tired, No syncope or chest pain, no PND or orthopnea.   Objective:  Vital Signs in the last 24 hours: Temp:  [98 F (36.7 C)-98.4 F (36.9 C)] 98.2 F (36.8 C) (07/10 1218) Pulse Rate:  [68-79] 68 (07/10 1218) Resp:  [16-20] 20 (07/10 0303) BP: (118-147)/(75-98) 130/84 (07/10 1305) SpO2:  [93 %-97 %] 96 % (07/10 1218) Weight:  [99.6 kg (219 lb 8 oz)-102.1 kg (225 lb)] 99.6 kg (219 lb 8 oz) (07/10 0303)  Intake/Output from previous day: 07/09 0701 - 07/10 0700 In: -  Out: 500 [Urine:500]  Physical Exam:  General appearance: alert, no distress and moderately obese Eyes: negative findings: lids and lashes normal Neck: no adenopathy, supple, symmetrical, trachea midline and thyroid not enlarged, symmetric, no tenderness/mass/nodules Neck: JVP - normal, carotids 2+= without bruits Resp: clear to auscultation bilaterally Chest wall: no tenderness Cardio: regular rate and rhythm, S1, S2 normal, no murmur, click, rub or gallop and distant heart sounds GI: soft, non-tender; bowel sounds normal; no masses,  no organomegaly and Obese with pannus. Grossly normal Extremities: extremities normal, atraumatic, no cyanosis or edema    Lab Results: BMP Recent Labs    08/02/17 1357 08/02/17 2219 08/03/17 0449  NA 140  --  139  K 3.6  --  3.8  CL 106  --  103  CO2 25  --  24  GLUCOSE 79  --  136*  BUN 14  --  17  CREATININE 1.19 1.17 1.15  CALCIUM 9.1  --  8.9  GFRNONAA >60 >60 >60  GFRAA >60 >60 >60    CBC Recent Labs  Lab 08/02/17 2219  WBC 8.5  RBC 4.74  HGB 14.1  HCT 44.7  PLT 219  MCV 94.3  MCH 29.7  MCHC 31.5  RDW 13.4    HEMOGLOBIN A1C Lab Results  Component Value Date   HGBA1C 9.3 (H) 05/26/2016   MPG 263 07/12/2015    Cardiac Panel (last 3 results) Recent Labs    08/02/17 2219 08/03/17 0449 08/03/17 0746  TROPONINI 0.03* 0.04* 0.06*    BNP (last 3 results) No results for input(s): PROBNP in the last 8760 hours.  TSH No  results for input(s): TSH in the last 8760 hours.  Lipid Panel     Component Value Date/Time   CHOL 145 07/16/2014 0310   TRIG 223 (H) 07/16/2014 0310   HDL 26 (L) 07/16/2014 0310   CHOLHDL 5.6 07/16/2014 0310   VLDL 45 (H) 07/16/2014 0310   LDLCALC 74 07/16/2014 0310     Hepatic Function Panel No results for input(s): PROT, ALBUMIN, AST, ALT, ALKPHOS, BILITOT, BILIDIR, IBILI in the last 8760 hours.  Imaging: Dg Chest 2 View  Result Date: 08/02/2017 CLINICAL DATA:  Shortness of breath. EXAM: CHEST - 2 VIEW COMPARISON:  Radiographs of February 05, 2015. FINDINGS: Mild cardiomegaly is noted. Status post coronary artery bypass graft. Left-sided pacemaker is unchanged in position. No pneumothorax or pleural effusion is noted. No acute pulmonary disease is noted. Bony thorax is unremarkable. IMPRESSION: No active cardiopulmonary disease. Electronically Signed   By: Marijo Conception, M.D.   On: 08/02/2017 15:25    Cardiac Studies:  EKG: Bi-V paced rhythm  Tele: Sustained VT last night rate 140/min. ICD parameters changed this morning for VF/VT detection rate,   ECHO: dilated LV at 6.3 cm, severe LV systolic dysfunction, EF 34%, grade 2 systolic dysfunction suggested, global hypokinesis with possible inferolateral akinesis.  Mild TR with moderate  pulmonary hypertension.  Assessment/Plan:  1.  Acute on chronic systolic and diastolic heart failure 2.  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 3. Sustained VT,  No therapy.  ICD interrogation, remote unscheduled reviewed and updated. Appreciate EP consult and change in parameters to detect VT (slow VT probably due to Amiodarone).  Device function normal with normal impedance and lead thresholds.  CHF evident.  4.  Diabetes mellitus type 2 uncontrolled with stage III chronic kidney disease, renal function appears to have normalized from previous. 5.  Ischemic cardiomyopathy with  severe LV systolic  dysfunction, EF 20% compared to previous in Oct 2018 35 to 40%. 6.  Mixed hyperlipidemia 7.  Obstructive sleep apnea on CPAP 8.  Hypertension  Rec: In view of change in EF and sustained VT, needs coronary angiogram. Will set it up for tomorrow. Discussed risks, benefits and alternatives of angiogram including but not limited to <1% risk of death, stroke, MI, need for urgent surgical revascularization, renal failure, but not limited to thest. patient is willing to proceed. Will add Entresto after angiogram if renal function remains stable.  Continue statins and present meds for DM and HTN.     Adrian Prows, M.D. 08/03/2017, 4:09 PM [facility] Pager: [phone] Office: [phone] If no answer: [phone]

## 2017-08-03 NOTE — Progress Notes (Signed)
Pt. back in Paced Rhythm

## 2017-08-03 NOTE — H&P (View-Only) (Signed)
Subjective:  Feels tired, No syncope or chest pain, no PND or orthopnea.   Objective:  Vital Signs in the last 24 hours: Temp:  [98 F (36.7 C)-98.4 F (36.9 C)] 98.2 F (36.8 C) (07/10 1218) Pulse Rate:  [68-79] 68 (07/10 1218) Resp:  [16-20] 20 (07/10 0303) BP: (118-147)/(75-98) 130/84 (07/10 1305) SpO2:  [93 %-97 %] 96 % (07/10 1218) Weight:  [99.6 kg (219 lb 8 oz)-102.1 kg (225 lb)] 99.6 kg (219 lb 8 oz) (07/10 0303)  Intake/Output from previous day: 07/09 0701 - 07/10 0700 In: -  Out: 500 [Urine:500]  Physical Exam:  General appearance: alert, no distress and moderately obese Eyes: negative findings: lids and lashes normal Neck: no adenopathy, supple, symmetrical, trachea midline and thyroid not enlarged, symmetric, no tenderness/mass/nodules Neck: JVP - normal, carotids 2+= without bruits Resp: clear to auscultation bilaterally Chest wall: no tenderness Cardio: regular rate and rhythm, S1, S2 normal, no murmur, click, rub or gallop and distant heart sounds GI: soft, non-tender; bowel sounds normal; no masses,  no organomegaly and Obese with pannus. Grossly normal Extremities: extremities normal, atraumatic, no cyanosis or edema    Lab Results: BMP Recent Labs    08/02/17 1357 08/02/17 2219 08/03/17 0449  NA 140  --  139  K 3.6  --  3.8  CL 106  --  103  CO2 25  --  24  GLUCOSE 79  --  136*  BUN 14  --  17  CREATININE 1.19 1.17 1.15  CALCIUM 9.1  --  8.9  GFRNONAA >60 >60 >60  GFRAA >60 >60 >60    CBC Recent Labs  Lab 08/02/17 2219  WBC 8.5  RBC 4.74  HGB 14.1  HCT 44.7  PLT 219  MCV 94.3  MCH 29.7  MCHC 31.5  RDW 13.4    HEMOGLOBIN A1C Lab Results  Component Value Date   HGBA1C 9.3 (H) 05/26/2016   MPG 263 07/12/2015    Cardiac Panel (last 3 results) Recent Labs    08/02/17 2219 08/03/17 0449 08/03/17 0746  TROPONINI 0.03* 0.04* 0.06*    BNP (last 3 results) No results for input(s): PROBNP in the last 8760 hours.  TSH No  results for input(s): TSH in the last 8760 hours.  Lipid Panel     Component Value Date/Time   CHOL 145 07/16/2014 0310   TRIG 223 (H) 07/16/2014 0310   HDL 26 (L) 07/16/2014 0310   CHOLHDL 5.6 07/16/2014 0310   VLDL 45 (H) 07/16/2014 0310   LDLCALC 74 07/16/2014 0310     Hepatic Function Panel No results for input(s): PROT, ALBUMIN, AST, ALT, ALKPHOS, BILITOT, BILIDIR, IBILI in the last 8760 hours.  Imaging: Dg Chest 2 View  Result Date: 08/02/2017 CLINICAL DATA:  Shortness of breath. EXAM: CHEST - 2 VIEW COMPARISON:  Radiographs of February 05, 2015. FINDINGS: Mild cardiomegaly is noted. Status post coronary artery bypass graft. Left-sided pacemaker is unchanged in position. No pneumothorax or pleural effusion is noted. No acute pulmonary disease is noted. Bony thorax is unremarkable. IMPRESSION: No active cardiopulmonary disease. Electronically Signed   By: Marijo Conception, M.D.   On: 08/02/2017 15:25    Cardiac Studies:  EKG: Bi-V paced rhythm  Tele: Sustained VT last night rate 140/min. ICD parameters changed this morning for VF/VT detection rate,   ECHO: dilated LV at 6.3 cm, severe LV systolic dysfunction, EF 67%, grade 2 systolic dysfunction suggested, global hypokinesis with possible inferolateral akinesis.  Mild TR with moderate  pulmonary hypertension.  Assessment/Plan:  1.  Acute on chronic systolic and diastolic heart failure 2.  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 3. Sustained VT,  No therapy.  ICD interrogation, remote unscheduled reviewed and updated. Appreciate EP consult and change in parameters to detect VT (slow VT probably due to Amiodarone).  Device function normal with normal impedance and lead thresholds.  CHF evident.  4.  Diabetes mellitus type 2 uncontrolled with stage III chronic kidney disease, renal function appears to have normalized from previous. 5.  Ischemic cardiomyopathy with  severe LV systolic  dysfunction, EF 20% compared to previous in Oct 2018 35 to 40%. 6.  Mixed hyperlipidemia 7.  Obstructive sleep apnea on CPAP 8.  Hypertension  Rec: In view of change in EF and sustained VT, needs coronary angiogram. Will set it up for tomorrow. Discussed risks, benefits and alternatives of angiogram including but not limited to <1% risk of death, stroke, MI, need for urgent surgical revascularization, renal failure, but not limited to thest. patient is willing to proceed. Will add Entresto after angiogram if renal function remains stable.  Continue statins and present meds for DM and HTN.     Adrian Prows, M.D. 08/03/2017, 4:09 PM [facility] Pager: [phone] Office: [phone] If no answer: [phone]

## 2017-08-03 NOTE — Progress Notes (Signed)
Pt. in and out of SVT and paced rhythm. Currently Paced. MD aware. Pt. Denies any chest pain, dizziness or n/v. States he can feel his heart beating fast.

## 2017-08-03 NOTE — Progress Notes (Signed)
  Echocardiogram 2D Echocardiogram has been performed.  Martin Bush 08/03/2017, 10:08 AM

## 2017-08-03 NOTE — Progress Notes (Signed)
Phone call received from Telemetry. Pt's. HR in 140 (SVT). Pt. Asked to cough and perform Vagel maneuver. No change. Dr. Einar Gip notified. Orders received for IVP Metoprolo, Amiodarone, and Digoxin. Medications administered. Pts. HR changed from SVT to paced as of right now.

## 2017-08-03 NOTE — Progress Notes (Signed)
Pt. back in paced rhythm.

## 2017-08-03 NOTE — Progress Notes (Addendum)
Pt. Switched back to SVT (140's) at 02.25. Dr. Einar Gip notified. Order received for Metoprolol IVP once more. EKG done.

## 2017-08-04 ENCOUNTER — Encounter (HOSPITAL_COMMUNITY): Payer: Self-pay | Admitting: Cardiology

## 2017-08-04 ENCOUNTER — Encounter (HOSPITAL_COMMUNITY)
Admission: EM | Disposition: A | Discharge status: Home / Self Care | Payer: Self-pay | Source: Home / Self Care | Attending: Cardiology

## 2017-08-04 HISTORY — PX: LEFT HEART CATH AND CORS/GRAFTS ANGIOGRAPHY: CATH118250

## 2017-08-04 HISTORY — PX: CORONARY BALLOON ANGIOPLASTY: CATH118233

## 2017-08-04 LAB — CBC
HCT: 45.3 % (ref 39.0–52.0)
Hemoglobin: 14.1 g/dL (ref 13.0–17.0)
MCH: 29.1 pg (ref 26.0–34.0)
MCHC: 31.1 g/dL (ref 30.0–36.0)
MCV: 93.6 fL (ref 78.0–100.0)
Platelets: 231 10*3/uL (ref 150–400)
RBC: 4.84 MIL/uL (ref 4.22–5.81)
RDW: 13.2 % (ref 11.5–15.5)
WBC: 10 10*3/uL (ref 4.0–10.5)

## 2017-08-04 LAB — BASIC METABOLIC PANEL
Anion gap: 12 (ref 5–15)
BUN: 23 mg/dL (ref 8–23)
CO2: 28 mmol/L (ref 22–32)
Calcium: 9.3 mg/dL (ref 8.9–10.3)
Chloride: 97 mmol/L — ABNORMAL LOW (ref 98–111)
Creatinine, Ser: 1.42 mg/dL — ABNORMAL HIGH (ref 0.61–1.24)
GFR calc Af Amer: >60 mL/min (ref >60)
GFR calc non Af Amer: 52 mL/min — ABNORMAL LOW (ref >60)
Glucose, Bld: 74 mg/dL (ref 70–99)
Potassium: 3.6 mmol/L (ref 3.5–5.1)
Sodium: 137 mmol/L (ref 135–145)

## 2017-08-04 LAB — POCT I-STAT, CHEM 8
BUN: 23 mg/dL (ref 8–23)
BUN: 22 mg/dL (ref 8–23)
BUN: 23 mg/dL (ref 8–23)
Calcium, Ion: 1.2 mmol/L (ref 1.15–1.40)
Calcium, Ion: 1.21 mmol/L (ref 1.15–1.40)
Calcium, Ion: 1.21 mmol/L (ref 1.15–1.40)
Chloride: 96 mmol/L — ABNORMAL LOW (ref 98–111)
Chloride: 95 mmol/L — ABNORMAL LOW (ref 98–111)
Chloride: 95 mmol/L — ABNORMAL LOW (ref 98–111)
Creatinine, Ser: 1.1 mg/dL (ref 0.61–1.24)
Creatinine, Ser: 1.1 mg/dL (ref 0.61–1.24)
Creatinine, Ser: 1.1 mg/dL (ref 0.61–1.24)
Glucose, Bld: 151 mg/dL — ABNORMAL HIGH (ref 70–99)
Glucose, Bld: 104 mg/dL — ABNORMAL HIGH (ref 70–99)
Glucose, Bld: 117 mg/dL — ABNORMAL HIGH (ref 70–99)
HCT: 43 % (ref 39.0–52.0)
HCT: 41 % (ref 39.0–52.0)
HCT: 42 % (ref 39.0–52.0)
Hemoglobin: 14.6 g/dL (ref 13.0–17.0)
Hemoglobin: 13.9 g/dL (ref 13.0–17.0)
Hemoglobin: 14.3 g/dL (ref 13.0–17.0)
Potassium: 3.2 mmol/L — ABNORMAL LOW (ref 3.5–5.1)
Potassium: 3.4 mmol/L — ABNORMAL LOW (ref 3.5–5.1)
Potassium: 3.5 mmol/L (ref 3.5–5.1)
Sodium: 138 mmol/L (ref 135–145)
Sodium: 136 mmol/L (ref 135–145)
Sodium: 137 mmol/L (ref 135–145)
TCO2: 27 mmol/L (ref 22–32)
TCO2: 27 mmol/L (ref 22–32)
TCO2: 27 mmol/L (ref 22–32)

## 2017-08-04 LAB — POCT ACTIVATED CLOTTING TIME
Activated Clotting Time: 263 seconds
Activated Clotting Time: 268 seconds

## 2017-08-04 LAB — GLUCOSE, CAPILLARY
Glucose-Capillary: 78 mg/dL (ref 70–99)
Glucose-Capillary: 103 mg/dL — ABNORMAL HIGH (ref 70–99)
Glucose-Capillary: 59 mg/dL — ABNORMAL LOW (ref 70–99)
Glucose-Capillary: 74 mg/dL (ref 70–99)
Glucose-Capillary: 98 mg/dL (ref 70–99)
Glucose-Capillary: 193 mg/dL — ABNORMAL HIGH (ref 70–99)
Glucose-Capillary: 195 mg/dL — ABNORMAL HIGH (ref 70–99)
Glucose-Capillary: 203 mg/dL — ABNORMAL HIGH (ref 70–99)

## 2017-08-04 LAB — BRAIN NATRIURETIC PEPTIDE: B Natriuretic Peptide: 800.3 pg/mL — ABNORMAL HIGH (ref 0.0–100.0)

## 2017-08-04 LAB — ECHOCARDIOGRAM COMPLETE
Height: 67 in
Weight: 3512 oz

## 2017-08-04 LAB — PROTIME-INR
INR: 1.07
Prothrombin Time: 13.8 seconds (ref 11.4–15.2)

## 2017-08-04 SURGERY — LEFT HEART CATH AND CORS/GRAFTS ANGIOGRAPHY
Anesthesia: LOCAL

## 2017-08-04 MED ORDER — NITROGLYCERIN 1 MG/10 ML FOR IR/CATH LAB
INTRA_ARTERIAL | Status: AC
  Filled 2017-08-04: qty 10

## 2017-08-04 MED ORDER — DEXTROSE-NACL 5-0.45 % IV SOLN
INTRAVENOUS | Status: AC | PRN
  Administered 2017-08-04: 50 mL/h via INTRAVENOUS

## 2017-08-04 MED ORDER — FENTANYL CITRATE (PF) 100 MCG/2ML IJ SOLN
INTRAMUSCULAR | Status: DC | PRN
  Administered 2017-08-04: 25 ug via INTRAVENOUS

## 2017-08-04 MED ORDER — LIDOCAINE HCL (PF) 1 % IJ SOLN
INTRAMUSCULAR | Status: DC | PRN
  Administered 2017-08-04: 2 mL

## 2017-08-04 MED ORDER — SODIUM CHLORIDE 0.9 % IV SOLN: 250.0000 mL | INTRAVENOUS | Status: DC | PRN

## 2017-08-04 MED ORDER — HYDRALAZINE HCL 20 MG/ML IJ SOLN: 5.0000 mg | INTRAMUSCULAR | Status: AC | PRN

## 2017-08-04 MED ORDER — TICAGRELOR 90 MG PO TABS
ORAL_TABLET | ORAL | Status: DC | PRN
  Administered 2017-08-04: 180 mg via ORAL

## 2017-08-04 MED ORDER — VERAPAMIL HCL 2.5 MG/ML IV SOLN
INTRA_ARTERIAL | Status: DC | PRN
  Administered 2017-08-04: 3 mL via INTRA_ARTERIAL

## 2017-08-04 MED ORDER — HEPARIN (PORCINE) IN NACL 1000-0.9 UT/500ML-% IV SOLN
INTRAVENOUS | Status: DC | PRN
  Administered 2017-08-04 (×2): 500 mL

## 2017-08-04 MED ORDER — HEPARIN SODIUM (PORCINE) 1000 UNIT/ML IJ SOLN
INTRAMUSCULAR | Status: AC
  Filled 2017-08-04: qty 1

## 2017-08-04 MED ORDER — TICAGRELOR 90 MG PO TABS
ORAL_TABLET | ORAL | Status: AC
  Filled 2017-08-04: qty 2

## 2017-08-04 MED ORDER — ANGIOPLASTY BOOK
Freq: Once | Status: DC
  Filled 2017-08-04: qty 1

## 2017-08-04 MED ORDER — SODIUM CHLORIDE 0.9% FLUSH: 3.0000 mL | Freq: Two times a day (BID) | INTRAVENOUS | Status: DC

## 2017-08-04 MED ORDER — NITROGLYCERIN 1 MG/10 ML FOR IR/CATH LAB
INTRA_ARTERIAL | Status: DC | PRN
  Administered 2017-08-04: 200 ug via INTRACORONARY

## 2017-08-04 MED ORDER — SODIUM CHLORIDE 0.9 % IV SOLN
INTRAVENOUS | Status: DC
  Administered 2017-08-04: 11:00:00 via INTRAVENOUS

## 2017-08-04 MED ORDER — SODIUM CHLORIDE 0.9% FLUSH: 3.0000 mL | INTRAVENOUS | Status: DC | PRN

## 2017-08-04 MED ORDER — TICAGRELOR 90 MG PO TABS
90.0000 mg | ORAL_TABLET | Freq: Two times a day (BID) | ORAL | Status: DC
  Administered 2017-08-04 – 2017-08-05 (×2): 90 mg via ORAL
  Filled 2017-08-04 (×2): qty 1

## 2017-08-04 MED ORDER — INSULIN ASPART 100 UNIT/ML ~~LOC~~ SOLN: 0.0000 [IU] | Freq: Every day | SUBCUTANEOUS | Status: DC

## 2017-08-04 MED ORDER — MIDAZOLAM HCL 2 MG/2ML IJ SOLN
INTRAMUSCULAR | Status: AC
  Filled 2017-08-04: qty 2

## 2017-08-04 MED ORDER — INSULIN ASPART 100 UNIT/ML ~~LOC~~ SOLN: 2.0000 [IU] | SUBCUTANEOUS | Status: DC

## 2017-08-04 MED ORDER — SODIUM CHLORIDE 0.9 % IV SOLN: INTRAVENOUS | Status: DC

## 2017-08-04 MED ORDER — IOHEXOL 350 MG/ML SOLN
INTRAVENOUS | Status: DC | PRN
  Administered 2017-08-04: 5 mL

## 2017-08-04 MED ORDER — MIDAZOLAM HCL 2 MG/2ML IJ SOLN
INTRAMUSCULAR | Status: DC | PRN
  Administered 2017-08-04: 2 mg via INTRAVENOUS

## 2017-08-04 MED ORDER — IOPAMIDOL (ISOVUE-370) INJECTION 76%
INTRAVENOUS | Status: AC
  Filled 2017-08-04: qty 125

## 2017-08-04 MED ORDER — INSULIN ASPART 100 UNIT/ML ~~LOC~~ SOLN
0.0000 [IU] | Freq: Three times a day (TID) | SUBCUTANEOUS | Status: DC
  Administered 2017-08-04: 19:00:00 3 [IU] via SUBCUTANEOUS
  Administered 2017-08-05: 07:00:00 2 [IU] via SUBCUTANEOUS

## 2017-08-04 MED ORDER — FENTANYL CITRATE (PF) 100 MCG/2ML IJ SOLN
INTRAMUSCULAR | Status: AC
Start: 2017-08-04 — End: ?
  Filled 2017-08-04: qty 2

## 2017-08-04 MED ORDER — DEXTROSE 50 % IV SOLN
INTRAVENOUS | Status: AC
  Filled 2017-08-04: qty 50

## 2017-08-04 MED ORDER — LIDOCAINE HCL (PF) 1 % IJ SOLN
INTRAMUSCULAR | Status: AC
  Filled 2017-08-04: qty 30

## 2017-08-04 MED ORDER — IOPAMIDOL (ISOVUE-370) INJECTION 76%
INTRAVENOUS | Status: DC | PRN
  Administered 2017-08-04: 100 mL

## 2017-08-04 MED ORDER — HEPARIN (PORCINE) IN NACL 1000-0.9 UT/500ML-% IV SOLN
INTRAVENOUS | Status: AC
  Filled 2017-08-04: qty 1000

## 2017-08-04 MED ORDER — INSULIN ASPART PROT & ASPART (70-30 MIX) 100 UNIT/ML ~~LOC~~ SUSP
40.0000 [IU] | Freq: Two times a day (BID) | SUBCUTANEOUS | Status: DC
Start: 2017-08-04 — End: 2017-08-05
  Administered 2017-08-05: 08:00:00 40 [IU] via SUBCUTANEOUS
  Filled 2017-08-04: qty 10

## 2017-08-04 MED ORDER — LIVING WELL WITH DIABETES BOOK
Freq: Once | Status: AC
  Administered 2017-08-05: 06:00:00
  Filled 2017-08-04: qty 1

## 2017-08-04 MED ORDER — VERAPAMIL HCL 2.5 MG/ML IV SOLN
INTRAVENOUS | Status: AC
  Filled 2017-08-04: qty 2

## 2017-08-04 MED ORDER — HEPARIN SODIUM (PORCINE) 1000 UNIT/ML IJ SOLN
INTRAMUSCULAR | Status: DC | PRN
  Administered 2017-08-04: 5000 [IU] via INTRAVENOUS
  Administered 2017-08-04: 4000 [IU] via INTRAVENOUS
  Administered 2017-08-04 (×2): 2000 [IU] via INTRAVENOUS

## 2017-08-04 MED ORDER — DEXTROSE 50 % IV SOLN
INTRAVENOUS | Status: DC | PRN
  Administered 2017-08-04: 1 via INTRAVENOUS

## 2017-08-04 SURGICAL SUPPLY — 16 items
BALLN EMERGE MR 2.0X12 (BALLOONS) ×2
BALLOON EMERGE MR 2.0X12 (BALLOONS) ×1 IMPLANT
CATH INFINITI 5FR AL1 (CATHETERS) ×2 IMPLANT
CATH INFINITI 5FR MULTPACK ANG (CATHETERS) ×2 IMPLANT
CATH VISTA GUIDE 6FR XB3.5 (CATHETERS) ×2 IMPLANT
DEVICE RAD COMP TR BAND LRG (VASCULAR PRODUCTS) ×2 IMPLANT
GLIDESHEATH SLEND A-KIT 6F 20G (SHEATH) ×2 IMPLANT
GUIDEWIRE INQWIRE 1.5J.035X260 (WIRE) ×1 IMPLANT
INQWIRE 1.5J .035X260CM (WIRE) ×2
KIT ENCORE 26 ADVANTAGE (KITS) ×2 IMPLANT
KIT HEART LEFT (KITS) ×2 IMPLANT
PACK CARDIAC CATHETERIZATION (CUSTOM PROCEDURE TRAY) ×2 IMPLANT
TRANSDUCER W/STOPCOCK (MISCELLANEOUS) ×2 IMPLANT
TUBING CIL FLEX 10 FLL-RA (TUBING) ×2 IMPLANT
WIRE COUGAR XT STRL 190CM (WIRE) ×2 IMPLANT
WIRE HI TORQ WHISPER MS 190CM (WIRE) ×2 IMPLANT

## 2017-08-04 NOTE — Progress Notes (Signed)
Pt voided in urinal, had shave prep done and am meds with a small sip of water, VSS, BS 74, will let them know in cath lab

## 2017-08-04 NOTE — Progress Notes (Signed)
Inpatient Diabetes Program Recommendations  AACE/ADA: New Consensus Statement on Inpatient Glycemic Control (2019)  Target Ranges:  Prepandial:   less than 140 mg/dL      Peak postprandial:   less than 180 mg/dL (1-2 hours)      Critically ill patients:  140 - 180 mg/dL   Results for Martin Bush, Martin Bush (MRN [id number]) as of 08/04/2017 14:53  Ref. Range 08/04/2017 02:00 08/04/2017 04:10 08/04/2017 06:42 08/04/2017 07:21 08/04/2017 09:38 08/04/2017 12:17  Glucose-Capillary Latest Ref Range: 70 - 99 mg/dL 78 103 (H) 74 59 (L) 98 193 (H)   Review of Glycemic Control  Diabetes history: DM2 Outpatient Diabetes medications: Jardiance 25 mg daily, Metformin 1000 mg BID, Victoza 1.8 mg daily, 70/30 60 units 1-2 times per day Current orders for Inpatient glycemic control: 70/30 40 units BID, Novolog 0-15 units TID with meals, Novolog 0-5 units QHS  Inpatient Diabetes Program Recommendations: Insulin - Basal: Please discontinue 70/30 and consider ordering Lantus 25 units QHS (based on 97 kg x 0.25 units).   NOTE: In reviewing chart, noted patient received 70/30 60 units at 9:00 and another 60 units at 17:56 on 08/03/17 and patient has experienced several incidents of hypoglycemia since then. Patient was NPO since midnight for heart cath today. Talked with patient to inquire about DM regimen. Patient states that he sees an Endocrinologist at the [place] and that he takes:  Jardiance 25 mg daily, Metformin 1000 mg BID, Victoza 1.8 mg daily, 70/30 60 units 1-2 times per day.  Patient reports that he has been losing weight and that he does not consistently take 70/30 twice a day because he was having hypoglycemia. Patient reports that he checks his glucose in the morning and if his glucose is over 100 mg/dl then he takes the 70/30 60 units but if his glucose is trending okay in the evening he does NOT take the evening dose of 70/30. Asked patient to be sure to let his Endocrinologist know what he has actually been taking so  that adjustments can be made by the doctor if needed to help improve DM control. Explained that 70/30 60 units BID was given on 08/03/17 as it was noted on home medication list and as a result glucose has been trending low. No 70/30 was given this morning and no insulin or DM medications have been given today up to this point. Explained that it would be recommended to discontinue 70/30 as an inpatient and to use low dose Lantus tonight. Explained it would be up to the physician as to whether the recommendations would be followed or not. Patient verbalized understanding of information and reports that he has no further questions or concerns at this time related to DM. Talked with [name], RN and paged Dr. Einar Gip at 15:05 to discuss recommendations.  Thanks, [name], RN, MSN, CDE Diabetes Coordinator Inpatient Diabetes Program [phone] (Team Pager from 8am to 5pm)

## 2017-08-04 NOTE — Progress Notes (Signed)
TR BAND REMOVAL  LOCATION:  left radial  DEFLATED PER PROTOCOL:  Yes.    TIME BAND OFF / DRESSING APPLIED:   1330   SITE UPON ARRIVAL:   Level 0  SITE AFTER BAND REMOVAL:  Level 0  CIRCULATION SENSATION AND MOVEMENT:  Within Normal Limits  Yes.    COMMENTS:

## 2017-08-04 NOTE — Interval H&P Note (Signed)
History and Physical Interval Note:  08/04/2017 7:46 AM  Martin Bush  has presented today for surgery, with the diagnosis of cad  The various methods of treatment have been discussed with the patient and family. After consideration of risks, benefits and other options for treatment, the patient has consented to  Procedure(s): LEFT HEART CATH AND CORS/GRAFTS ANGIOGRAPHY (N/A) and possible PCI as a surgical intervention .  The patient's history has been reviewed, patient examined, no change in status, stable for surgery.  I have reviewed the patient's chart and labs.  Questions were answered to the patient's satisfaction.   Cath Lab Visit (complete for each Cath Lab visit)  Clinical Evaluation Leading to the Procedure:   ACS: Yes.    Non-ACS:    Anginal Classification: CCS IV  Anti-ischemic medical therapy: Maximal Therapy (2 or more classes of medications)  Non-Invasive Test Results: No non-invasive testing performed  Prior CABG: Previous CABG        Adrian Prows

## 2017-08-04 NOTE — Progress Notes (Signed)
Updated Dr Elson Areas re: low BS and that he was given 1/2 amp D50 as per protocol and rechecked and came up to 95, will recheck at 2AM and treat as needed since he is now NPO for heart cath in AM, VSS, no c/o discomfort.

## 2017-08-04 NOTE — Progress Notes (Signed)
Patient wearing CPAP when RT entered room. Patient is tolerating well at this time. RT will monitor as needed.

## 2017-08-04 NOTE — Care Management Note (Signed)
Case Management Note  Patient Details  Name: Martin Bush MRN: [id number] Date of Birth: [birth year]  Subjective/Objective:  From home with wife, s/p stent intervention, will be on brilinta, he goes to [facility] to get his medications.  RN will give him the 30 day coupon for the brilinta.  His PCP is Christel Mormon fax number is [phone], NCM will fax dc summary with new medication to PCP.  Per wife patient does not need any HH services , he is indep at home.                   Action/Plan: DC home when ready.   Expected Discharge Date:                  Expected Discharge Plan:  Home/Self Care  In-House Referral:     Discharge planning Services  CM Consult, Medication Assistance  Post Acute Care Choice:    Choice offered to:     DME Arranged:    DME Agency:     HH Arranged:    HH Agency:     Status of Service:  Completed, signed off  If discussed at [employer] of Stay Meetings, dates discussed:    Additional Comments:  Zenon Mayo, RN 08/04/2017, 12:04 PM

## 2017-08-05 LAB — BASIC METABOLIC PANEL
Anion gap: 11 (ref 5–15)
BUN: 20 mg/dL (ref 8–23)
CO2: 28 mmol/L (ref 22–32)
Calcium: 9.5 mg/dL (ref 8.9–10.3)
Chloride: 97 mmol/L — ABNORMAL LOW (ref 98–111)
Creatinine, Ser: 1.28 mg/dL — ABNORMAL HIGH (ref 0.61–1.24)
GFR calc Af Amer: >60 mL/min (ref >60)
GFR calc non Af Amer: 59 mL/min — ABNORMAL LOW (ref >60)
Glucose, Bld: 154 mg/dL — ABNORMAL HIGH (ref 70–99)
Potassium: 3.6 mmol/L (ref 3.5–5.1)
Sodium: 136 mmol/L (ref 135–145)

## 2017-08-05 LAB — CBC
HCT: 46 % (ref 39.0–52.0)
Hemoglobin: 14.7 g/dL (ref 13.0–17.0)
MCH: 29.5 pg (ref 26.0–34.0)
MCHC: 32 g/dL (ref 30.0–36.0)
MCV: 92.4 fL (ref 78.0–100.0)
Platelets: 212 10*3/uL (ref 150–400)
RBC: 4.98 MIL/uL (ref 4.22–5.81)
RDW: 13.2 % (ref 11.5–15.5)
WBC: 8.7 10*3/uL (ref 4.0–10.5)

## 2017-08-05 LAB — GLUCOSE, CAPILLARY
Glucose-Capillary: 138 mg/dL — ABNORMAL HIGH (ref 70–99)
Glucose-Capillary: 76 mg/dL (ref 70–99)

## 2017-08-05 MED ORDER — TICAGRELOR 90 MG PO TABS: 90.0000 mg | ORAL_TABLET | Freq: Two times a day (BID) | ORAL | 3 refills | Status: DC

## 2017-08-05 MED ORDER — METFORMIN HCL 1000 MG PO TABS: 1000.0000 mg | ORAL_TABLET | Freq: Two times a day (BID) | ORAL | Status: DC

## 2017-08-05 NOTE — Progress Notes (Signed)
CARDIAC REHAB PHASE I   PRE:  Rate/Rhythm: 65 AV paced  BP:  Supine:   Sitting: 140/70  Standing:    SaO2: 94%RA  MODE:  Ambulation: 500 ft   POST:  Rate/Rhythm: 105 AV paced  BP:  Supine:   Sitting: 148/85  Standing:    SaO2: 96%RA 0815-0910 Pt walked 500 ft on RA with steady gait and no CP or dizziness. Tolerated well except elevated HR. Stressed importance of brililnta, reviewed NTG use, gave diabetic and heart healthy food choices, and discussed CRP 2 for after staged procedure. Will refer to [facility] with comment that pt for staged PCI. Did not give ex ed as pt returning for procedure and his HR jumped 40 beats with short walk. Referred to Dr Einar Gip.  Understanding voiced by pt and wife.  Graylon Good, RN BSN  08/05/2017 9:05 AM

## 2017-08-05 NOTE — Discharge Summary (Signed)
Physician Discharge Summary  Patient ID: Martin Bush MRN: [id number] DOB/AGE: January 26, 1956 62 y.o.  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  Secondary Discharge Diagnosis Diabetes mellitus type II controlled with hypoglycemia. Moderate obesity Obstructive sleep apnea on CPAP Hypertension controlled with stage III chronic kidney disease Mixed hyperlipidemia Patient enrolled in clinical trials for anemia in CHF  With Injectafer Hypoglycemia  Significant Diagnostic Studies:  Coronary angiogram 08/04/2017: Left main: Previously placed stent in the proximal segment is patent but the distal end of the stent shows a in-stent restenosis of 60% followed by 80% stenosis in the distal left main.   Circumflex: Ostial circumflex stent is widely patent.  Both the OM1 and PDA branches which are previously stented are occluded with no flow or collaterals evident.  SVG to OM is known occluded vessel. LAD: Occluded in the proximal segment, LIMA to LAD is widely patent and appears healthy. RCA: Previously known severely diffused and small nondominant RCA.  Not cannulated.  Intervention data: Successful balloon angioplasty of the left main with reduction of stenosis from 80% to less than 50%, successful angioplasty with a 2.0 x 12 mm emerge balloon of the OM branch of circumflex and also PDA branch of circumflex coronary artery, stenosis reduced from 100% to less than 20%.  The large PDA branch has still haziness with residual luminal irregularity.  TIMI-3 flow was established.  Recommendation: The distal left main shows a calcific 50% residual stenosis, which anticipated to have recurrent restenosis and he will need definitive therapy and completion of therapy  with probable atherectomy followed by stenting.  He may benefit by doing this in a elective end-stage fashion in view of severely elevated LVEDP and life-threatening ventricular tachycardia/arrhythmia and severely depressed LVEF.  This will be scheduled in an elective fashion in the next few days in the outpatient basis in view of renal insufficiency and elevated LVEDP.  105 mill contrast utilized.  Echocardiogram 08/03/2017: - Left ventricle: The cavity size was mildly dilated. Systolic   function was severely reduced. The estimated ejection fraction   was in the range of 20% to 25%. Diffuse hypokinesis. Akinesis of  the entire inferolateral myocardium. The study is not technically   sufficient to allow evaluation of LV diastolic function. This is  due to paced rhythm, however Doppler data suggest Grade 2  diastolic dysfunction with elevated LVEDP and LA pressure.  - Left atrium: The atrium was severely dilated. - Pulmonary arteries: PA peak pressure: 51 mm Hg.  EKG: A sense Bi-V paced rhythm.  Tele: Sustained VT  Hospital Course: Patient presented with shortness of breath and dizziness, pacemaker/ICD interrogation revealed nonsustained ventricle tachycardia.  His admitted to the hospital for further evaluation, patient had slow ventricle tachycardia below detection level by the ICD, fortunately remained hemodynamically stable, was evaluated by Dr. Crissie Sickles and ICD was reprogrammed to lower detection rate for VT.  Echocardiogram revealed markedly reduced LVEF, change from previous, hence underwent coronary angiography and was found to have occluded circumflex marginal and PDA branch which were previously stented in 2015, which I had performed scoring balloon angioplasty in 2016.  He underwent repeat low angioplasty with excellent results, however has just below left main disease which he needs atherectomy followed by stenting in the non-stented segment.  He was felt stable for discharge,  had no  recurrence of VT for 48 hours.  Ruled out for MI.  He will be scheduled for outpatient visit and management.  Renal function remained stable.  He did have episodes of hypoglycemia during hospitalization, hence is insulin dose was readjusted. He will be continued on dual antiplatelet appendectomy for at least one year, will also consider changing to aspirin and Xarelto at a later date.  Weight loss was again reinforced, fortunately he has been losing weight over the past 6 months.  Rec: No driving for 6 months. F/U with EP Dr. Cristopher Peru.    Discharge Exam: Blood pressure 140/70, pulse 74, temperature 98.2 F (36.8 C), resp. rate (!) 37, height 5\' 7"  (1.702 m), weight 98.5 kg (217 lb 2.5 oz), SpO2 96 %. Body mass index is 34.01 kg/m.  General appearance: alert, no distress and moderately obese Eyes: negative findings: lids and lashes normal Neck: no adenopathy, supple, symmetrical, trachea midline and thyroid not enlarged, symmetric, no tenderness/mass/nodules Neck: JVP - normal, carotids 2+= without bruits Resp: clear to auscultation bilaterally Chest wall: no tenderness Cardio: regular rate and rhythm, S1, S2 normal, no murmur, click, rub or gallop and distant heart sounds GI: soft, non-tender; bowel sounds normal; no masses,  no organomegaly and Obese with pannus. Grossly normal Extremities: extremities normal, atraumatic, no cyanosis or edema. Left radial access site without complications.   Labs:   Lab Results  Component Value Date   WBC 8.7 08/05/2017   HGB 14.7 08/05/2017   HCT 46.0 08/05/2017   MCV 92.4 08/05/2017   PLT 212 08/05/2017    Recent Labs  Lab 08/05/17 0258  NA 136  K 3.6  CL 97*  CO2 28  BUN 20  CREATININE 1.28*  CALCIUM 9.5  GLUCOSE 154*    Lipid Panel     Component Value Date/Time   CHOL 145 07/16/2014 0310   TRIG 223 (H) 07/16/2014 0310   HDL 26 (L) 07/16/2014 0310   CHOLHDL 5.6 07/16/2014 0310   VLDL 45 (H) 07/16/2014 0310   LDLCALC 74  07/16/2014 0310    BNP (last 3 results) Recent Labs    08/02/17 1357 08/02/17 2219 08/04/17 0615  BNP 838.4* 579.4* 800.3*    HEMOGLOBIN A1C Lab Results  Component Value Date   HGBA1C 9.3 (H) 05/26/2016   MPG 263 07/12/2015   Radiology: Dg Chest 2 View  Result Date: 08/02/2017 CLINICAL DATA:  Shortness of breath. EXAM: CHEST - 2 VIEW COMPARISON:  Radiographs of February 05, 2015. FINDINGS: Mild cardiomegaly is noted. Status post coronary artery bypass graft. Left-sided pacemaker is unchanged in position. No pneumothorax or pleural effusion is noted. No acute pulmonary disease is noted. Bony thorax is unremarkable. IMPRESSION: No active cardiopulmonary disease. Electronically Signed   By: Marijo Conception, M.D.   On: 08/02/2017 15:25      FOLLOW UP PLANS AND APPOINTMENTS Discharge Instructions    Amb Referral to Cardiac Rehabilitation   Complete by:  As directed    To return for staged PCI   Diagnosis:  PTCA     Allergies as of 08/05/2017      Reactions   Diltiazem Rash      Medication List    TAKE these medications   amiodarone 200 MG tablet Commonly known as:  PACERONE Take 200 mg by mouth daily.   aspirin EC 81 MG tablet Take 81 mg by mouth daily.   carvedilol 12.5 MG tablet Commonly known as:  COREG Take 6.25 mg by  mouth 2 (two) times daily with a meal.   Fish Oil 1000 MG Caps Take 2,000 mg by mouth 3 (three) times daily.   hydrALAZINE 25 MG tablet Commonly known as:  APRESOLINE Take 25 mg by mouth 2 (two) times daily.   hydrocortisone 2.5 % cream Apply topically 2 (two) times daily. Right back for itching What changed:    how much to take  when to take this  reasons to take this  additional instructions   insulin aspart protamine- aspart (70-30) 100 UNIT/ML injection Commonly known as:  NOVOLOG MIX 70/30 Inject 60 Units into the skin 2 (two) times daily with a meal.   isosorbide mononitrate 30 MG 24 hr tablet Commonly known as:  IMDUR Take  30 mg by mouth daily.   JARDIANCE 25 MG Tabs tablet Generic drug:  empagliflozin Take 25 mg by mouth daily.   metFORMIN 1000 MG tablet Commonly known as:  GLUCOPHAGE Take 1 tablet (1,000 mg total) by mouth 2 (two) times daily with a meal. Start taking on:  08/06/2017   mupirocin ointment 2 % Commonly known as:  BACTROBAN Apply 1 application topically 2 (two) times daily. To right back   pravastatin 80 MG tablet Commonly known as:  PRAVACHOL Take 80 mg by mouth at bedtime.   PRESCRIPTION MEDICATION Inhale into the lungs at bedtime. CPAP   sertraline 100 MG tablet Commonly known as:  ZOLOFT Take 50 mg by mouth daily.   ticagrelor 90 MG Tabs tablet Commonly known as:  BRILINTA Take 1 tablet (90 mg total) by mouth 2 (two) times daily.   VICTOZA [place] Inject 1.8 Units into the skin daily.   Vitamin D 2000 units tablet Take 2,000 Units by mouth 2 (two) times daily.      Follow-up Information    Evans Lance, MD Follow up on 09/14/2017.   Specialty:  Cardiology Why:  at [place] information: [address] [place] [address] [phone]        Adrian Prows, MD Follow up.   Specialty:  Cardiology Why:  To be seen in 10 days. Our offic ewill call you Contact information: [place] [address] [phone]            Adrian Prows, MD 08/05/2017, 10:59 AM  Pager: [phone] Office: [phone] If no answer: [phone]

## 2017-08-06 ENCOUNTER — Encounter (HOSPITAL_COMMUNITY): Payer: Self-pay | Admitting: Cardiology

## 2017-08-15 DIAGNOSIS — I5042 Chronic combined systolic (congestive) and diastolic (congestive) heart failure: Secondary | ICD-10-CM | POA: Diagnosis not present

## 2017-08-15 DIAGNOSIS — Z95810 Presence of automatic (implantable) cardiac defibrillator: Secondary | ICD-10-CM | POA: Diagnosis not present

## 2017-08-15 DIAGNOSIS — I2510 Atherosclerotic heart disease of native coronary artery without angina pectoris: Secondary | ICD-10-CM | POA: Diagnosis not present

## 2017-08-15 DIAGNOSIS — I25810 Atherosclerosis of coronary artery bypass graft(s) without angina pectoris: Secondary | ICD-10-CM | POA: Diagnosis not present

## 2017-08-16 ENCOUNTER — Other Ambulatory Visit: Payer: Self-pay

## 2017-08-16 ENCOUNTER — Encounter (HOSPITAL_COMMUNITY): Payer: Self-pay | Admitting: Cardiology

## 2017-08-16 ENCOUNTER — Ambulatory Visit (HOSPITAL_COMMUNITY)
Admission: RE | Admit: 2017-08-16 | Discharge: 2017-08-16 | Disposition: A | Discharge status: Home / Self Care | Payer: Medicare Other | Source: Ambulatory Visit | Attending: Cardiology | Admitting: Cardiology

## 2017-08-16 ENCOUNTER — Encounter (HOSPITAL_COMMUNITY)
Admission: RE | Disposition: A | Discharge status: Home / Self Care | Payer: Self-pay | Source: Ambulatory Visit | Attending: Cardiology

## 2017-08-16 DIAGNOSIS — G4733 Obstructive sleep apnea (adult) (pediatric): Secondary | ICD-10-CM | POA: Diagnosis not present

## 2017-08-16 DIAGNOSIS — E1122 Type 2 diabetes mellitus with diabetic chronic kidney disease: Secondary | ICD-10-CM | POA: Diagnosis not present

## 2017-08-16 DIAGNOSIS — I272 Pulmonary hypertension, unspecified: Secondary | ICD-10-CM | POA: Diagnosis not present

## 2017-08-16 DIAGNOSIS — I5043 Acute on chronic combined systolic (congestive) and diastolic (congestive) heart failure: Secondary | ICD-10-CM | POA: Insufficient documentation

## 2017-08-16 DIAGNOSIS — I2582 Chronic total occlusion of coronary artery: Secondary | ICD-10-CM | POA: Insufficient documentation

## 2017-08-16 DIAGNOSIS — J811 Chronic pulmonary edema: Secondary | ICD-10-CM | POA: Diagnosis not present

## 2017-08-16 DIAGNOSIS — N183 Chronic kidney disease, stage 3 (moderate): Secondary | ICD-10-CM | POA: Diagnosis not present

## 2017-08-16 DIAGNOSIS — E782 Mixed hyperlipidemia: Secondary | ICD-10-CM | POA: Insufficient documentation

## 2017-08-16 DIAGNOSIS — I25708 Atherosclerosis of coronary artery bypass graft(s), unspecified, with other forms of angina pectoris: Secondary | ICD-10-CM | POA: Insufficient documentation

## 2017-08-16 DIAGNOSIS — I252 Old myocardial infarction: Secondary | ICD-10-CM | POA: Diagnosis not present

## 2017-08-16 DIAGNOSIS — I130 Hypertensive heart and chronic kidney disease with heart failure and stage 1 through stage 4 chronic kidney disease, or unspecified chronic kidney disease: Secondary | ICD-10-CM | POA: Insufficient documentation

## 2017-08-16 DIAGNOSIS — I25118 Atherosclerotic heart disease of native coronary artery with other forms of angina pectoris: Secondary | ICD-10-CM | POA: Diagnosis not present

## 2017-08-16 DIAGNOSIS — Z95810 Presence of automatic (implantable) cardiac defibrillator: Secondary | ICD-10-CM | POA: Diagnosis not present

## 2017-08-16 DIAGNOSIS — I255 Ischemic cardiomyopathy: Secondary | ICD-10-CM | POA: Diagnosis not present

## 2017-08-16 DIAGNOSIS — I214 Non-ST elevation (NSTEMI) myocardial infarction: Secondary | ICD-10-CM | POA: Diagnosis not present

## 2017-08-16 DIAGNOSIS — I472 Ventricular tachycardia: Secondary | ICD-10-CM | POA: Insufficient documentation

## 2017-08-16 DIAGNOSIS — I2510 Atherosclerotic heart disease of native coronary artery without angina pectoris: Secondary | ICD-10-CM

## 2017-08-16 DIAGNOSIS — Z955 Presence of coronary angioplasty implant and graft: Secondary | ICD-10-CM

## 2017-08-16 DIAGNOSIS — I25810 Atherosclerosis of coronary artery bypass graft(s) without angina pectoris: Secondary | ICD-10-CM | POA: Diagnosis present

## 2017-08-16 HISTORY — PX: CORONARY STENT INTERVENTION: CATH118234

## 2017-08-16 HISTORY — PX: CORONARY ATHERECTOMY: CATH118238

## 2017-08-16 LAB — BASIC METABOLIC PANEL
Anion gap: 9 (ref 5–15)
BUN: 15 mg/dL (ref 8–23)
CO2: 25 mmol/L (ref 22–32)
Calcium: 9.1 mg/dL (ref 8.9–10.3)
Chloride: 104 mmol/L (ref 98–111)
Creatinine, Ser: 1.15 mg/dL (ref 0.61–1.24)
GFR calc Af Amer: >60 mL/min (ref >60)
GFR calc non Af Amer: >60 mL/min (ref >60)
Glucose, Bld: 129 mg/dL — ABNORMAL HIGH (ref 70–99)
Potassium: 3.7 mmol/L (ref 3.5–5.1)
Sodium: 138 mmol/L (ref 135–145)

## 2017-08-16 LAB — CBC
HCT: 42.3 % (ref 39.0–52.0)
Hemoglobin: 13.7 g/dL (ref 13.0–17.0)
MCH: 30.2 pg (ref 26.0–34.0)
MCHC: 32.4 g/dL (ref 30.0–36.0)
MCV: 93.2 fL (ref 78.0–100.0)
Platelets: 214 10*3/uL (ref 150–400)
RBC: 4.54 MIL/uL (ref 4.22–5.81)
RDW: 13 % (ref 11.5–15.5)
WBC: 9.3 10*3/uL (ref 4.0–10.5)

## 2017-08-16 LAB — POCT ACTIVATED CLOTTING TIME
Activated Clotting Time: 725 seconds
Activated Clotting Time: 296 seconds

## 2017-08-16 LAB — GLUCOSE, CAPILLARY: Glucose-Capillary: 134 mg/dL — ABNORMAL HIGH (ref 70–99)

## 2017-08-16 SURGERY — CORONARY STENT INTERVENTION
Anesthesia: LOCAL

## 2017-08-16 MED ORDER — SODIUM CHLORIDE 0.9% FLUSH: 3.0000 mL | INTRAVENOUS | Status: DC | PRN

## 2017-08-16 MED ORDER — HEPARIN SODIUM (PORCINE) 1000 UNIT/ML IJ SOLN
INTRAMUSCULAR | Status: AC
  Filled 2017-08-16: qty 1

## 2017-08-16 MED ORDER — LIDOCAINE HCL (PF) 1 % IJ SOLN
INTRAMUSCULAR | Status: DC | PRN
  Administered 2017-08-16: 2 mL

## 2017-08-16 MED ORDER — SODIUM CHLORIDE 0.9 % IV SOLN: 250.0000 mL | INTRAVENOUS | Status: DC | PRN

## 2017-08-16 MED ORDER — SODIUM CHLORIDE 0.9% FLUSH: 3.0000 mL | Freq: Two times a day (BID) | INTRAVENOUS | Status: DC

## 2017-08-16 MED ORDER — ASPIRIN 81 MG PO CHEW: 81.0000 mg | CHEWABLE_TABLET | Freq: Once | ORAL | Status: DC

## 2017-08-16 MED ORDER — NITROGLYCERIN 1 MG/10 ML FOR IR/CATH LAB
INTRA_ARTERIAL | Status: AC
  Filled 2017-08-16: qty 10

## 2017-08-16 MED ORDER — HEPARIN (PORCINE) IN NACL 1000-0.9 UT/500ML-% IV SOLN
INTRAVENOUS | Status: AC
  Filled 2017-08-16: qty 1000

## 2017-08-16 MED ORDER — TICAGRELOR 90 MG PO TABS
ORAL_TABLET | ORAL | Status: AC
  Filled 2017-08-16: qty 1

## 2017-08-16 MED ORDER — IOPAMIDOL (ISOVUE-370) INJECTION 76%
INTRAVENOUS | Status: AC
  Filled 2017-08-16: qty 125

## 2017-08-16 MED ORDER — ASPIRIN 81 MG PO CHEW
CHEWABLE_TABLET | ORAL | Status: AC
  Administered 2017-08-16: 81 mg
  Filled 2017-08-16: qty 1

## 2017-08-16 MED ORDER — ACETAMINOPHEN 325 MG PO TABS: 650.0000 mg | ORAL_TABLET | ORAL | Status: DC | PRN

## 2017-08-16 MED ORDER — MIDAZOLAM HCL 2 MG/2ML IJ SOLN
INTRAMUSCULAR | Status: DC | PRN
  Administered 2017-08-16: 2 mg via INTRAVENOUS

## 2017-08-16 MED ORDER — FENTANYL CITRATE (PF) 100 MCG/2ML IJ SOLN
INTRAMUSCULAR | Status: AC
  Filled 2017-08-16: qty 2

## 2017-08-16 MED ORDER — NITROGLYCERIN 1 MG/10 ML FOR IR/CATH LAB
INTRA_ARTERIAL | Status: DC | PRN
  Administered 2017-08-16: 200 ug via INTRACORONARY

## 2017-08-16 MED ORDER — VERAPAMIL HCL 2.5 MG/ML IV SOLN
INTRAVENOUS | Status: AC
  Filled 2017-08-16: qty 2

## 2017-08-16 MED ORDER — TICAGRELOR 90 MG PO TABS
90.0000 mg | ORAL_TABLET | Freq: Once | ORAL | Status: AC
  Administered 2017-08-16: 180 mg via ORAL

## 2017-08-16 MED ORDER — MIDAZOLAM HCL 2 MG/2ML IJ SOLN
INTRAMUSCULAR | Status: AC
  Filled 2017-08-16: qty 2

## 2017-08-16 MED ORDER — LIDOCAINE HCL (PF) 1 % IJ SOLN
INTRAMUSCULAR | Status: AC
  Filled 2017-08-16: qty 30

## 2017-08-16 MED ORDER — FENTANYL CITRATE (PF) 100 MCG/2ML IJ SOLN
INTRAMUSCULAR | Status: DC | PRN
  Administered 2017-08-16: 25 ug via INTRAVENOUS

## 2017-08-16 MED ORDER — SODIUM CHLORIDE 0.9 % IV SOLN: INTRAVENOUS | Status: DC

## 2017-08-16 MED ORDER — VERAPAMIL HCL 2.5 MG/ML IV SOLN
INTRA_ARTERIAL | Status: DC | PRN
  Administered 2017-08-16: 7.5 mL via INTRA_ARTERIAL

## 2017-08-16 MED ORDER — HEPARIN (PORCINE) IN NACL 1000-0.9 UT/500ML-% IV SOLN
INTRAVENOUS | Status: DC | PRN
  Administered 2017-08-16 (×2): 500 mL

## 2017-08-16 MED ORDER — LABETALOL HCL 5 MG/ML IV SOLN: 10.0000 mg | INTRAVENOUS | Status: DC | PRN

## 2017-08-16 MED ORDER — HYDRALAZINE HCL 20 MG/ML IJ SOLN: 5.0000 mg | INTRAMUSCULAR | Status: DC | PRN

## 2017-08-16 MED ORDER — ONDANSETRON HCL 4 MG/2ML IJ SOLN: 4.0000 mg | Freq: Four times a day (QID) | INTRAMUSCULAR | Status: DC | PRN

## 2017-08-16 MED ORDER — SODIUM CHLORIDE 0.9 % IV SOLN
INTRAVENOUS | Status: DC
  Administered 2017-08-16: 08:00:00 via INTRAVENOUS

## 2017-08-16 MED ORDER — HEPARIN SODIUM (PORCINE) 1000 UNIT/ML IJ SOLN
INTRAMUSCULAR | Status: DC | PRN
  Administered 2017-08-16: 2000 [IU] via INTRAVENOUS
  Administered 2017-08-16: 8000 [IU] via INTRAVENOUS

## 2017-08-16 MED ORDER — VIPERSLIDE LUBRICANT OPTIME
TOPICAL | Status: DC | PRN
  Administered 2017-08-16: 500 mL via SURGICAL_CAVITY

## 2017-08-16 SURGICAL SUPPLY — 16 items
CATH VISTA GUIDE 6FR XB3.5 (CATHETERS) ×2 IMPLANT
CROWN DIAMONDBACK CLASSIC 1.25 (BURR) ×2 IMPLANT
GLIDESHEATH SLEND A-KIT 6F 20G (SHEATH) ×2 IMPLANT
GUIDEWIRE INQWIRE 1.5J.035X260 (WIRE) ×1 IMPLANT
INQWIRE 1.5J .035X260CM (WIRE) ×2
KIT ENCORE 26 ADVANTAGE (KITS) ×2 IMPLANT
KIT HEART LEFT (KITS) ×2 IMPLANT
KIT HEMO VALVE WATCHDOG (MISCELLANEOUS) ×2 IMPLANT
LUBRICANT VIPERSLIDE CORONARY (MISCELLANEOUS) ×2 IMPLANT
PACK CARDIAC CATHETERIZATION (CUSTOM PROCEDURE TRAY) ×2 IMPLANT
STENT SYNERGY DES 3.5X20 (Permanent Stent) ×2 IMPLANT
STENT SYNERGY DES 3X20 (Permanent Stent) ×2 IMPLANT
TRANSDUCER W/STOPCOCK (MISCELLANEOUS) ×2 IMPLANT
TUBING CIL FLEX 10 FLL-RA (TUBING) ×2 IMPLANT
WIRE COUGAR XT STRL 190CM (WIRE) ×2 IMPLANT
WIRE VIPER ADVANCE COR .012TIP (WIRE) ×2 IMPLANT

## 2017-08-16 NOTE — Discharge Instructions (Signed)

## 2017-08-16 NOTE — Progress Notes (Signed)
1340-1420 Pt just seen on the 12th of this month to educate. Brief review done with pt and wife. Reinforced brilinta. Gave ex ed and referred again to [facility] Phase 2. Encouraged daily weights and low sodium. Understanding voiced. Graylon Good RN BSN 08/16/2017 2:17 PM

## 2017-08-16 NOTE — Interval H&P Note (Signed)
History and Physical Interval Note:  08/16/2017 8:44 AM  Martin Bush  has presented today for surgery, with the diagnosis of cp  cad  The various methods of treatment have been discussed with the patient and family. After consideration of risks, benefits and other options for treatment, the patient has consented to  Procedure(s): CORONARY STENT INTERVENTION (N/A) as a surgical intervention .  The patient's history has been reviewed, patient examined, no change in status, stable for surgery.  I have reviewed the patient's chart and labs.  Questions were answered to the patient's satisfaction.     Symptom Status: Ischemic Symptoms Non-invasive Testing: Not done If no or indeterminate stress test, FFR/iFR results in all diseased vessels: Not done Diabetes Mellitus: Yes S/P CABG: Yes Antianginal therapy (number of long-acting drugs): >=2 Patient undergoing renal transplant: No Patient undergoing percutaneous valve procedure: No  LIMA-LAD patent and without significant stenoses Stenosis supplying 1 territory (bypass graft or native artery) other than anterior  PCI: Not rated  CABG: Not rated Stenoses supplying 2 territories (bypass graft or native artery, either 2 separate vessels or sequential graft supplying 2 territories) not including anterior territory  PCI: Not rated  CABG: Not rated  LIMA-LAD not patent Stenosis supplying 1 territory (bypass graft or native artery)-anterior (LAD) territory  PCI: Not rated  CABG: Not rated Stenoses supplying 2 territories (bypass graft or native artery, either 2 separate vessels or sequential graft supplying 2 territories)-LAD plus other territory  PCI: Not rated  CABG: Not rated Stenoses supplying 3 territories (bypass graft or native arteries, separate vessels, sequential grafts, or combination thereof)-LAD plus 2 other territories  PCI: Not rated  CABG: Not rated  Notes:  A indicates appropriate. M indicates may be appropriate. R indicates  rarely appropriate. Number in parentheses is median score for that indication. Reclassify indicates number of functionally diseased vessels should be decreased given negative FFR/iFR. Re-evaluate the scenario interpreting any FFR/iFR negative vessel as being not significantly stenosed.  Journal of the [employer] of Cardiology Mar 2017, 23391; DOI: 10.1016/j.jacc.2017.02.001 [email or web address].de.[email or web address].full-text.pdf This App  2018 by the Society for Cardiovascular Angiography and Interventions   Jadyn Brasher J Chadrick Sprinkle

## 2017-08-16 NOTE — Progress Notes (Signed)
Patient with protected LM angioplasty, short segment and Occluded LAD with patent LIMA and stent in ostial LM and Prox Cx. Hence safe to proceed. No circulatory assist device needed in view of stable cardiac symptoms, No CHF and short segment lesion.  JG

## 2017-08-16 NOTE — Progress Notes (Signed)
Okay for discharge  Nigel Mormon, MD [facility] Pager: [phone] Office: [phone] If no answer Cell [phone]

## 2017-08-19 DIAGNOSIS — R531 Weakness: Secondary | ICD-10-CM | POA: Diagnosis not present

## 2017-08-19 DIAGNOSIS — R069 Unspecified abnormalities of breathing: Secondary | ICD-10-CM | POA: Diagnosis not present

## 2017-08-19 DIAGNOSIS — R0902 Hypoxemia: Secondary | ICD-10-CM | POA: Diagnosis not present

## 2017-08-19 DIAGNOSIS — I10 Essential (primary) hypertension: Secondary | ICD-10-CM | POA: Diagnosis not present

## 2017-08-19 DIAGNOSIS — R0689 Other abnormalities of breathing: Secondary | ICD-10-CM | POA: Diagnosis not present

## 2017-08-20 ENCOUNTER — Emergency Department (HOSPITAL_COMMUNITY): Payer: Medicare Other

## 2017-08-20 ENCOUNTER — Other Ambulatory Visit: Payer: Self-pay

## 2017-08-20 ENCOUNTER — Inpatient Hospital Stay (HOSPITAL_COMMUNITY)
Admission: EM | Disposition: A | Discharge status: Home / Self Care | Payer: Self-pay | Source: Home / Self Care | Attending: Cardiology

## 2017-08-20 ENCOUNTER — Inpatient Hospital Stay (HOSPITAL_COMMUNITY)
Admission: EM | Admit: 2017-08-20 | Discharge: 2017-08-21 | DRG: 280 | Disposition: A | Discharge status: Home / Self Care | Payer: Medicare Other | Attending: Cardiology | Admitting: Cardiology

## 2017-08-20 ENCOUNTER — Encounter (HOSPITAL_COMMUNITY): Payer: Self-pay | Admitting: Emergency Medicine

## 2017-08-20 DIAGNOSIS — E119 Type 2 diabetes mellitus without complications: Secondary | ICD-10-CM | POA: Diagnosis not present

## 2017-08-20 DIAGNOSIS — Z888 Allergy status to other drugs, medicaments and biological substances status: Secondary | ICD-10-CM | POA: Diagnosis not present

## 2017-08-20 DIAGNOSIS — Z8249 Family history of ischemic heart disease and other diseases of the circulatory system: Secondary | ICD-10-CM | POA: Diagnosis not present

## 2017-08-20 DIAGNOSIS — I5023 Acute on chronic systolic (congestive) heart failure: Secondary | ICD-10-CM | POA: Diagnosis present

## 2017-08-20 DIAGNOSIS — I214 Non-ST elevation (NSTEMI) myocardial infarction: Principal | ICD-10-CM | POA: Diagnosis present

## 2017-08-20 DIAGNOSIS — J810 Acute pulmonary edema: Secondary | ICD-10-CM

## 2017-08-20 DIAGNOSIS — Z9842 Cataract extraction status, left eye: Secondary | ICD-10-CM | POA: Diagnosis not present

## 2017-08-20 DIAGNOSIS — I255 Ischemic cardiomyopathy: Secondary | ICD-10-CM | POA: Diagnosis present

## 2017-08-20 DIAGNOSIS — R0602 Shortness of breath: Secondary | ICD-10-CM | POA: Diagnosis not present

## 2017-08-20 DIAGNOSIS — I2510 Atherosclerotic heart disease of native coronary artery without angina pectoris: Secondary | ICD-10-CM | POA: Diagnosis present

## 2017-08-20 DIAGNOSIS — E785 Hyperlipidemia, unspecified: Secondary | ICD-10-CM | POA: Diagnosis present

## 2017-08-20 DIAGNOSIS — Z95810 Presence of automatic (implantable) cardiac defibrillator: Secondary | ICD-10-CM | POA: Diagnosis not present

## 2017-08-20 DIAGNOSIS — Z87891 Personal history of nicotine dependence: Secondary | ICD-10-CM | POA: Diagnosis not present

## 2017-08-20 DIAGNOSIS — J811 Chronic pulmonary edema: Secondary | ICD-10-CM | POA: Diagnosis present

## 2017-08-20 DIAGNOSIS — N179 Acute kidney failure, unspecified: Secondary | ICD-10-CM | POA: Diagnosis present

## 2017-08-20 DIAGNOSIS — Z7901 Long term (current) use of anticoagulants: Secondary | ICD-10-CM

## 2017-08-20 DIAGNOSIS — Z79899 Other long term (current) drug therapy: Secondary | ICD-10-CM | POA: Diagnosis not present

## 2017-08-20 DIAGNOSIS — G4733 Obstructive sleep apnea (adult) (pediatric): Secondary | ICD-10-CM | POA: Diagnosis present

## 2017-08-20 DIAGNOSIS — Z833 Family history of diabetes mellitus: Secondary | ICD-10-CM

## 2017-08-20 DIAGNOSIS — I25118 Atherosclerotic heart disease of native coronary artery with other forms of angina pectoris: Secondary | ICD-10-CM | POA: Diagnosis not present

## 2017-08-20 DIAGNOSIS — Z951 Presence of aortocoronary bypass graft: Secondary | ICD-10-CM

## 2017-08-20 DIAGNOSIS — I110 Hypertensive heart disease with heart failure: Secondary | ICD-10-CM | POA: Diagnosis present

## 2017-08-20 DIAGNOSIS — Z794 Long term (current) use of insulin: Secondary | ICD-10-CM | POA: Diagnosis not present

## 2017-08-20 DIAGNOSIS — Z961 Presence of intraocular lens: Secondary | ICD-10-CM | POA: Diagnosis not present

## 2017-08-20 DIAGNOSIS — Z955 Presence of coronary angioplasty implant and graft: Secondary | ICD-10-CM | POA: Diagnosis not present

## 2017-08-20 DIAGNOSIS — Z7982 Long term (current) use of aspirin: Secondary | ICD-10-CM

## 2017-08-20 HISTORY — PX: LEFT HEART CATH AND CORS/GRAFTS ANGIOGRAPHY: CATH118250

## 2017-08-20 LAB — BRAIN NATRIURETIC PEPTIDE: B Natriuretic Peptide: 1061.6 pg/mL — ABNORMAL HIGH (ref 0.0–100.0)

## 2017-08-20 LAB — I-STAT ARTERIAL BLOOD GAS, ED
Acid-base deficit: 4 mmol/L — ABNORMAL HIGH (ref 0.0–2.0)
Bicarbonate: 21.6 mmol/L (ref 20.0–28.0)
O2 Saturation: 98 %
TCO2: 23 mmol/L (ref 22–32)
pCO2 arterial: 38.9 mmHg (ref 32.0–48.0)
pH, Arterial: 7.352 (ref 7.350–7.450)
pO2, Arterial: 110 mmHg — ABNORMAL HIGH (ref 83.0–108.0)

## 2017-08-20 LAB — CBC WITH DIFFERENTIAL/PLATELET
Abs Immature Granulocytes: 0.1 10*3/uL (ref 0.0–0.1)
Basophils Absolute: 0.1 10*3/uL (ref 0.0–0.1)
Basophils Relative: 1 %
Eosinophils Absolute: 0.4 10*3/uL (ref 0.0–0.7)
Eosinophils Relative: 3 %
HCT: 40.8 % (ref 39.0–52.0)
Hemoglobin: 13.2 g/dL (ref 13.0–17.0)
Immature Granulocytes: 1 %
Lymphocytes Relative: 3 %
Lymphs Abs: 0.4 10*3/uL — ABNORMAL LOW (ref 0.7–4.0)
MCH: 29.8 pg (ref 26.0–34.0)
MCHC: 32.4 g/dL (ref 30.0–36.0)
MCV: 92.1 fL (ref 78.0–100.0)
Monocytes Absolute: 1.1 10*3/uL — ABNORMAL HIGH (ref 0.1–1.0)
Monocytes Relative: 8 %
Neutro Abs: 13 10*3/uL — ABNORMAL HIGH (ref 1.7–7.7)
Neutrophils Relative %: 86 %
Platelets: 218 10*3/uL (ref 150–400)
RBC: 4.43 MIL/uL (ref 4.22–5.81)
RDW: 12.9 % (ref 11.5–15.5)
WBC: 15.1 10*3/uL — ABNORMAL HIGH (ref 4.0–10.5)

## 2017-08-20 LAB — GLUCOSE, CAPILLARY
Glucose-Capillary: 100 mg/dL — ABNORMAL HIGH (ref 70–99)
Glucose-Capillary: 112 mg/dL — ABNORMAL HIGH (ref 70–99)
Glucose-Capillary: 133 mg/dL — ABNORMAL HIGH (ref 70–99)

## 2017-08-20 LAB — I-STAT TROPONIN, ED: Troponin i, poc: 1.24 ng/mL (ref 0.00–0.08)

## 2017-08-20 LAB — I-STAT CHEM 8, ED
BUN: 20 mg/dL (ref 8–23)
Calcium, Ion: 1.15 mmol/L (ref 1.15–1.40)
Chloride: 100 mmol/L (ref 98–111)
Creatinine, Ser: 1 mg/dL (ref 0.61–1.24)
Glucose, Bld: 165 mg/dL — ABNORMAL HIGH (ref 70–99)
HCT: 42 % (ref 39.0–52.0)
Hemoglobin: 14.3 g/dL (ref 13.0–17.0)
Potassium: 3.7 mmol/L (ref 3.5–5.1)
Sodium: 134 mmol/L — ABNORMAL LOW (ref 135–145)
TCO2: 21 mmol/L — ABNORMAL LOW (ref 22–32)

## 2017-08-20 SURGERY — LEFT HEART CATH AND CORS/GRAFTS ANGIOGRAPHY
Anesthesia: LOCAL

## 2017-08-20 MED ORDER — HYDRALAZINE HCL 25 MG PO TABS
25.0000 mg | ORAL_TABLET | Freq: Two times a day (BID) | ORAL | Status: DC
Start: 2017-08-20 — End: 2017-08-21
  Administered 2017-08-20 – 2017-08-21 (×3): 25 mg via ORAL
  Filled 2017-08-20 (×3): qty 1

## 2017-08-20 MED ORDER — ISOSORBIDE MONONITRATE ER 30 MG PO TB24
30.0000 mg | ORAL_TABLET | Freq: Every day | ORAL | Status: DC
  Administered 2017-08-20 – 2017-08-21 (×2): 30 mg via ORAL
  Filled 2017-08-20 (×2): qty 1

## 2017-08-20 MED ORDER — POTASSIUM CHLORIDE CRYS ER 20 MEQ PO TBCR
40.0000 meq | EXTENDED_RELEASE_TABLET | Freq: Once | ORAL | Status: AC
  Administered 2017-08-20: 40 meq via ORAL
  Filled 2017-08-20: qty 2

## 2017-08-20 MED ORDER — ORAL CARE MOUTH RINSE
15.0000 mL | Freq: Two times a day (BID) | OROMUCOSAL | Status: DC
  Administered 2017-08-20 (×2): 15 mL via OROMUCOSAL

## 2017-08-20 MED ORDER — IOPAMIDOL (ISOVUE-370) INJECTION 76%
INTRAVENOUS | Status: AC
  Filled 2017-08-20: qty 125

## 2017-08-20 MED ORDER — SODIUM CHLORIDE 0.9% FLUSH
3.0000 mL | INTRAVENOUS | Status: DC | PRN
  Administered 2017-08-20: 3 mL via INTRAVENOUS
  Filled 2017-08-20: qty 3

## 2017-08-20 MED ORDER — ASPIRIN EC 81 MG PO TBEC
81.0000 mg | DELAYED_RELEASE_TABLET | Freq: Every day | ORAL | Status: DC
  Administered 2017-08-20 – 2017-08-21 (×2): 81 mg via ORAL
  Filled 2017-08-20 (×2): qty 1

## 2017-08-20 MED ORDER — SODIUM CHLORIDE 0.9% FLUSH
3.0000 mL | Freq: Two times a day (BID) | INTRAVENOUS | Status: DC
  Administered 2017-08-20 – 2017-08-21 (×2): 3 mL via INTRAVENOUS

## 2017-08-20 MED ORDER — TICAGRELOR 90 MG PO TABS
90.0000 mg | ORAL_TABLET | Freq: Two times a day (BID) | ORAL | Status: DC
  Administered 2017-08-20 – 2017-08-21 (×3): 90 mg via ORAL
  Filled 2017-08-20 (×3): qty 1

## 2017-08-20 MED ORDER — FUROSEMIDE 10 MG/ML IJ SOLN
40.0000 mg | Freq: Three times a day (TID) | INTRAMUSCULAR | Status: DC
  Administered 2017-08-20 – 2017-08-21 (×4): 40 mg via INTRAVENOUS
  Filled 2017-08-20 (×4): qty 4

## 2017-08-20 MED ORDER — AMIODARONE HCL 200 MG PO TABS
200.0000 mg | ORAL_TABLET | Freq: Every day | ORAL | Status: DC
Start: 2017-08-20 — End: 2017-08-21
  Administered 2017-08-20 – 2017-08-21 (×2): 200 mg via ORAL
  Filled 2017-08-20 (×2): qty 1

## 2017-08-20 MED ORDER — MIDAZOLAM HCL 2 MG/2ML IJ SOLN
INTRAMUSCULAR | Status: DC | PRN
  Administered 2017-08-20: 1 mg via INTRAVENOUS

## 2017-08-20 MED ORDER — TICAGRELOR 90 MG PO TABS
ORAL_TABLET | ORAL | Status: DC | PRN
  Administered 2017-08-20: 90 mg via ORAL

## 2017-08-20 MED ORDER — LIDOCAINE HCL (PF) 1 % IJ SOLN
INTRAMUSCULAR | Status: AC
  Filled 2017-08-20: qty 30

## 2017-08-20 MED ORDER — ACETAMINOPHEN 325 MG PO TABS: 650.0000 mg | ORAL_TABLET | ORAL | Status: DC | PRN

## 2017-08-20 MED ORDER — HEPARIN (PORCINE) IN NACL 100-0.45 UNIT/ML-% IJ SOLN
1200.0000 [IU]/h | INTRAMUSCULAR | Status: DC
  Filled 2017-08-20: qty 250

## 2017-08-20 MED ORDER — SODIUM CHLORIDE 0.9 % IV SOLN
250.0000 mL | INTRAVENOUS | Status: DC | PRN
  Administered 2017-08-21: 250 mL via INTRAVENOUS

## 2017-08-20 MED ORDER — SERTRALINE HCL 50 MG PO TABS
50.0000 mg | ORAL_TABLET | Freq: Every day | ORAL | Status: DC
  Administered 2017-08-20 – 2017-08-21 (×2): 50 mg via ORAL
  Filled 2017-08-20 (×2): qty 1

## 2017-08-20 MED ORDER — IOPAMIDOL (ISOVUE-370) INJECTION 76%
INTRAVENOUS | Status: DC | PRN
  Administered 2017-08-20: 30 mL via INTRA_ARTERIAL

## 2017-08-20 MED ORDER — FENTANYL CITRATE (PF) 100 MCG/2ML IJ SOLN
INTRAMUSCULAR | Status: DC | PRN
  Administered 2017-08-20: 25 ug via INTRAVENOUS

## 2017-08-20 MED ORDER — ENOXAPARIN SODIUM 100 MG/ML ~~LOC~~ SOLN
1.0000 mg/kg | Freq: Two times a day (BID) | SUBCUTANEOUS | Status: DC
  Administered 2017-08-20 – 2017-08-21 (×2): 100 mg via SUBCUTANEOUS
  Filled 2017-08-20 (×3): qty 1

## 2017-08-20 MED ORDER — INSULIN ASPART 100 UNIT/ML ~~LOC~~ SOLN
0.0000 [IU] | Freq: Three times a day (TID) | SUBCUTANEOUS | Status: DC
  Administered 2017-08-20 – 2017-08-21 (×2): 2 [IU] via SUBCUTANEOUS

## 2017-08-20 MED ORDER — NITROGLYCERIN 2 % TD OINT
0.5000 [in_us] | TOPICAL_OINTMENT | Freq: Once | TRANSDERMAL | Status: AC
Start: 2017-08-20 — End: 2017-08-20
  Administered 2017-08-20: 0.5 [in_us] via TOPICAL
  Filled 2017-08-20: qty 1

## 2017-08-20 MED ORDER — LIDOCAINE HCL (PF) 1 % IJ SOLN
INTRAMUSCULAR | Status: DC | PRN
  Administered 2017-08-20: 15 mL

## 2017-08-20 MED ORDER — HEPARIN (PORCINE) IN NACL 1000-0.9 UT/500ML-% IV SOLN
INTRAVENOUS | Status: DC | PRN
  Administered 2017-08-20 (×2): 500 mL

## 2017-08-20 MED ORDER — ONDANSETRON HCL 4 MG/2ML IJ SOLN
4.0000 mg | Freq: Four times a day (QID) | INTRAMUSCULAR | Status: DC | PRN
  Administered 2017-08-20: 4 mg via INTRAVENOUS
  Filled 2017-08-20: qty 2

## 2017-08-20 MED ORDER — SODIUM CHLORIDE 0.9 % IV SOLN
INTRAVENOUS | Status: AC | PRN
  Administered 2017-08-20: 10 mL/h via INTRAVENOUS

## 2017-08-20 MED ORDER — MIDAZOLAM HCL 2 MG/2ML IJ SOLN
INTRAMUSCULAR | Status: AC
  Filled 2017-08-20: qty 2

## 2017-08-20 MED ORDER — TICAGRELOR 90 MG PO TABS
ORAL_TABLET | ORAL | Status: AC
  Filled 2017-08-20: qty 1

## 2017-08-20 MED ORDER — NITROGLYCERIN 1 MG/10 ML FOR IR/CATH LAB
INTRA_ARTERIAL | Status: AC
  Filled 2017-08-20: qty 10

## 2017-08-20 MED ORDER — INSULIN ASPART PROT & ASPART (70-30 MIX) 100 UNIT/ML ~~LOC~~ SUSP
60.0000 [IU] | Freq: Two times a day (BID) | SUBCUTANEOUS | Status: DC
  Administered 2017-08-20: 60 [IU] via SUBCUTANEOUS
  Filled 2017-08-20: qty 10

## 2017-08-20 MED ORDER — PRAVASTATIN SODIUM 40 MG PO TABS
80.0000 mg | ORAL_TABLET | Freq: Every day | ORAL | Status: DC
  Administered 2017-08-20: 80 mg via ORAL
  Filled 2017-08-20: qty 2

## 2017-08-20 MED ORDER — FUROSEMIDE 10 MG/ML IJ SOLN
40.0000 mg | Freq: Once | INTRAMUSCULAR | Status: AC
  Administered 2017-08-20: 40 mg via INTRAVENOUS
  Filled 2017-08-20: qty 4

## 2017-08-20 MED ORDER — CARVEDILOL 6.25 MG PO TABS
6.2500 mg | ORAL_TABLET | Freq: Two times a day (BID) | ORAL | Status: DC
  Administered 2017-08-20 – 2017-08-21 (×3): 6.25 mg via ORAL
  Filled 2017-08-20 (×3): qty 1

## 2017-08-20 MED ORDER — FENTANYL CITRATE (PF) 100 MCG/2ML IJ SOLN
INTRAMUSCULAR | Status: AC
  Filled 2017-08-20: qty 2

## 2017-08-20 SURGICAL SUPPLY — 12 items
CATH INFINITI 5 FR IM (CATHETERS) ×2 IMPLANT
CATH INFINITI 5FR JL4 (CATHETERS) ×2 IMPLANT
KIT ENCORE 26 ADVANTAGE (KITS) ×2 IMPLANT
KIT HEART LEFT (KITS) ×2 IMPLANT
KIT HEMO VALVE WATCHDOG (MISCELLANEOUS) ×2 IMPLANT
KIT MICROPUNCTURE NIT STIFF (SHEATH) ×2 IMPLANT
PACK CARDIAC CATHETERIZATION (CUSTOM PROCEDURE TRAY) ×2 IMPLANT
SHEATH PINNACLE 6F 10CM (SHEATH) ×2 IMPLANT
SHEATH PROBE COVER 6X72 (BAG) ×2 IMPLANT
TRANSDUCER W/STOPCOCK (MISCELLANEOUS) ×2 IMPLANT
TUBING CIL FLEX 10 FLL-RA (TUBING) ×2 IMPLANT
WIRE EMERALD 3MM-J .035X150CM (WIRE) ×2 IMPLANT

## 2017-08-20 NOTE — Progress Notes (Signed)
ANTICOAGULATION CONSULT NOTE - Initial Consult  Pharmacy Consult for heparin Indication: chest pain/ACS, s/p cath  Allergies  Allergen Reactions  . Diltiazem Rash    Patient Measurements: Height: 5\' 7"  (170.2 cm) Weight: 214 lb (97.1 kg) IBW/kg (Calculated) : 66.1 Heparin Dosing Weight: 87 kg  Vital Signs: Temp: 99.4 F (37.4 C) (07/27 0029) Temp Source: Axillary (07/27 0029) BP: 152/97 (07/27 0354) Pulse Rate: 91 (07/27 0354)  Labs: Recent Labs    08/20/17 0030 08/20/17 0036  HGB 13.2 14.3  HCT 40.8 42.0  PLT 218  --   CREATININE  --  1.00    Estimated Creatinine Clearance: 86.1 mL/min (by C-G formula based on SCr of 1 mg/dL).   Medical History: Past Medical History:  Diagnosis Date  . AICD (automatic cardioverter/defibrillator) present 2016  . Arthritis    "thumbs"  (08/02/2017)  . CHF (congestive heart failure) ([place])   . Colon polyps   . Coronary artery disease   . Depression   . High cholesterol   . Hypertension   . MI (myocardial infarction) ([place]) 2003  . OSA on CPAP   . Pneumonia 10/2014  . Proteinuria   . Type II diabetes mellitus (H[facility])    insulin dependent    Medications:  See medication history  Assessment: 62 yo man with CP s/p cath to start heparin 8 hours after sheath removal.   Goal of Therapy:  Heparin level 0.3-0.7 units/ml Monitor platelets by anticoagulation protocol: Yes   Plan:  Start heparin drip at 1200 units/hr at 12 noon. F/u heparin level 6-8 hours after start  Excell Seltzer Poteet 08/20/2017,4:04 AM

## 2017-08-20 NOTE — Progress Notes (Signed)
Patient's CBG at 133 for tonight.

## 2017-08-20 NOTE — Consult Note (Deleted)
Reason for Consult: Pulmonary edema, NSTEMI Referring Physician: [facility]  Martin Bush is an 62 y.o. male.  HPI:   62 year old Caucasian male with hypertension, type 2 diabetes mellitus, hyperlipidemia, ischemic cardiomyopathy EF 20-25% status post ICD placement, coronary artery disease status post CABG 2 and multiple prior PCI's.   Patient underwent atherectomy and complex PCI with overlapping stents in left main extending into left PDA with synergy 3.0 x 20 and 3.5 x 20 mm drug-eluting stents (08/16/2017). Patient started having shortness of breath on 08/19/2017 throughout the day, and worsened around midnight. He was brought by EMS to the ER. Patient is hemodynamically stable with blood pressure 140/90 mmHg. Chest x-ray showed pulmonary congestion. EKG shows Sinus tachycardia with V-paced rhythm. Troponin elevated at 1.24. Interrogation of patient's device does not show any new VT/VF events.  Patient was taken to cath lab emergently to rule out acute stent thrombosis as etiology for his acute presentation. Cath showed patent stents and unchanged coronary and bypass graft anatomy compared to recent cath. LVEDP was elevated at 28 mmHg.  Past Medical History:  Diagnosis Date  . AICD (automatic cardioverter/defibrillator) present 2016  . Arthritis    "thumbs"  (08/02/2017)  . CHF (congestive heart failure) ([place])   . Colon polyps   . Coronary artery disease   . Depression   . High cholesterol   . Hypertension   . MI (myocardial infarction) ([place]) 2003  . OSA on CPAP   . Pneumonia 10/2014  . Proteinuria   . Type II diabetes mellitus (HCC)    insulin dependent    Past Surgical History:  Procedure Laterality Date  . CARDIAC CATHETERIZATION N/A 07/16/2014   Procedure: Left Heart Cath and Coronary Angiography;  Surgeon: Adrian Prows, Martin;  Location: [facility];  Service: Cardiovascular;  Laterality: N/A;  . CARDIAC CATHETERIZATION  07/16/2014   Procedure: Coronary Balloon  Angioplasty;  Surgeon: Adrian Prows, Martin;  Location: [place] [place] [facility];  Service: Cardiovascular;;  . CARDIAC CATHETERIZATION  2003  . CARDIAC DEFIBRILLATOR PLACEMENT  2016  . CATARACT EXTRACTION W/ INTRAOCULAR LENS IMPLANT Left 03/2014  . CORONARY ANGIOPLASTY WITH STENT PLACEMENT     "I've got a total of 8 stents in there; mostly doine at [facility]" (08/02/2017)  . CORONARY ARTERY BYPASS GRAFT  ~ 2003   "CABG X2"; [facility]  . CORONARY ATHERECTOMY N/A 08/16/2017   Procedure: CORONARY ATHERECTOMY;  Surgeon: Nigel Mormon, Martin;  Location: [place] [facility];  Service: Cardiovascular;  Laterality: N/A;  . CORONARY BALLOON ANGIOPLASTY N/A 08/04/2017   Procedure: CORONARY BALLOON ANGIOPLASTY;  Surgeon: Adrian Prows, Martin;  Location: [place] [facility];  Service: Cardiovascular;  Laterality: N/A;  . CORONARY STENT INTERVENTION N/A 08/16/2017   Procedure: CORONARY STENT INTERVENTION;  Surgeon: Nigel Mormon, Martin;  Location: [place] [facility];  Service: Cardiovascular;  Laterality: N/A;  . ELBOW SURGERY Left ?2001   "pinched nerve"  . LEFT HEART CATH AND CORS/GRAFTS ANGIOGRAPHY N/A 08/04/2017   Procedure: LEFT HEART CATH AND CORS/GRAFTS ANGIOGRAPHY;  Surgeon: Adrian Prows, Martin;  Location: [place] [facility];  Service: Cardiovascular;  Laterality: N/A;    Family History  Problem Relation Age of Onset  . Uterine cancer Mother   . Lung cancer Mother   . Hyperlipidemia Father   . Heart disease Father   . Hypertension Father   . Diabetes Father     Social History:  reports that he has never smoked. He quit smokeless tobacco  use about 17 years ago. He reports that he drinks alcohol. He reports that he does not use drugs.  Allergies:  Allergies  Allergen Reactions  . Diltiazem Rash    Medications: I have reviewed the patient's current medications.  Results for orders placed or performed during the hospital encounter of 08/20/17 (from the past 48 hour(s))  CBC with  Differential/Platelet     Status: Abnormal   Collection Time: 08/20/17 12:30 AM  Result Value Ref Range   WBC 15.1 (H) 4.0 - 10.5 K/uL   RBC 4.43 4.22 - 5.81 MIL/uL   Hemoglobin 13.2 13.0 - 17.0 g/dL   HCT 40.8 39.0 - 52.0 %   MCV 92.1 78.0 - 100.0 fL   MCH 29.8 26.0 - 34.0 pg   MCHC 32.4 30.0 - 36.0 g/dL   RDW 12.9 11.5 - 15.5 %   Platelets 218 150 - 400 K/uL   Neutrophils Relative % 86 %   Neutro Abs 13.0 (H) 1.7 - 7.7 K/uL   Lymphocytes Relative 3 %   Lymphs Abs 0.4 (L) 0.7 - 4.0 K/uL   Monocytes Relative 8 %   Monocytes Absolute 1.1 (H) 0.1 - 1.0 K/uL   Eosinophils Relative 3 %   Eosinophils Absolute 0.4 0.0 - 0.7 K/uL   Basophils Relative 1 %   Basophils Absolute 0.1 0.0 - 0.1 K/uL   Immature Granulocytes 1 %   Abs Immature Granulocytes 0.1 0.0 - 0.1 K/uL    Comment: Performed at [facility], [address], [place], [place] [address]  Brain natriuretic peptide     Status: Abnormal   Collection Time: 08/20/17 12:30 AM  Result Value Ref Range   B Natriuretic Peptide 1,061.6 (H) 0.0 - 100.0 pg/mL    Comment: Performed at [facility] [address], [place], [place] [address]  I-stat troponin, ED     Status: Abnormal   Collection Time: 08/20/17 12:33 AM  Result Value Ref Range   Troponin i, poc 1.24 (HH) 0.00 - 0.08 ng/mL   Comment NOTIFIED PHYSICIAN    Comment 3            Comment: Due to the release kinetics of cTnI, a negative result within the first hours of the onset of symptoms does not rule out myocardial infarction with certainty. If myocardial infarction is still suspected, repeat the test at appropriate intervals.   I-Stat Chem 8, ED     Status: Abnormal   Collection Time: 08/20/17 12:36 AM  Result Value Ref Range   Sodium 134 (L) 135 - 145 mmol/L   Potassium 3.7 3.5 - 5.1 mmol/L   Chloride 100 98 - 111 mmol/L   BUN 20 8 - 23 mg/dL   Creatinine, Ser 1.00 0.61 - 1.24 mg/dL   Glucose, Bld 165 (H) 70 - 99 mg/dL   Calcium, Ion 1.15 1.15 -  1.40 mmol/L   TCO2 21 (L) 22 - 32 mmol/L   Hemoglobin 14.3 13.0 - 17.0 g/dL   HCT 42.0 39.0 - 52.0 %  I-Stat arterial blood gas, ED     Status: Abnormal   Collection Time: 08/20/17  1:19 AM  Result Value Ref Range   pH, Arterial 7.352 7.350 - 7.450   pCO2 arterial 38.9 32.0 - 48.0 mmHg   pO2, Arterial 110.0 (H) 83.0 - 108.0 mmHg   Bicarbonate 21.6 20.0 - 28.0 mmol/L   TCO2 23 22 - 32 mmol/L   O2 Saturation 98.0 %   Acid-base deficit  4.0 (H) 0.0 - 2.0 mmol/L   Patient temperature HIDE    Collection site RADIAL, ALLEN'S TEST ACCEPTABLE    Drawn by RT    Sample type ARTERIAL     Dg Chest Portable 1 View  Result Date: 08/20/2017 CLINICAL DATA:  Increased shortness of breath for 2 days. Coronary stents placed 4 days ago. Pacemaker has been firing. EXAM: PORTABLE CHEST 1 VIEW COMPARISON:  08/02/2017 FINDINGS: Postoperative changes in the mediastinum. Cardiac pacemaker. Heart size is enlarged. Moderate pulmonary vascular congestion. Perihilar and basilar interstitial changes consistent with edema. Progression since previous study. No pleural effusions. No pneumothorax. Mediastinal contours appear intact. IMPRESSION: Cardiac enlargement with pulmonary vascular congestion and interstitial edema, developing since previous study. Electronically Signed   By: Lucienne Capers M.D.   On: 08/20/2017 00:51    Review of Systems  Constitutional: Negative for chills and fever.  HENT: Negative.   Respiratory: Positive for sputum production and shortness of breath.   Cardiovascular: Negative for chest pain and palpitations.  Gastrointestinal: Negative.   Genitourinary: Negative.   Musculoskeletal: Negative.   Neurological: Negative for loss of consciousness.  Endo/Heme/Allergies: Does not bruise/bleed easily.  Psychiatric/Behavioral: Negative.   All other systems reviewed and are negative.  Blood pressure (!) 152/97, pulse 91, temperature 99.4 F (37.4 C), temperature source Axillary, resp. rate  (!) 22, height 5\' 7"  (1.702 m), weight 97.1 kg (214 lb), SpO2 96 %. Physical Exam  Nursing note and vitals reviewed. Constitutional: He is oriented to person, place, and time. He appears well-developed and well-nourished. He appears distressed.  HENT:  Head: Normocephalic and atraumatic.  Cardiovascular: S1 normal, S2 normal and intact distal pulses. Tachycardia present.  Respiratory: Accessory muscle usage present. Tachypnea noted. He is in respiratory distress. He has decreased breath sounds in the right lower field and the left lower field. He has wheezes.  Musculoskeletal: He exhibits edema (2+).  Neurological: He is alert and oriented to person, place, and time. No cranial nerve deficit.  Skin: Skin is warm and dry.  Psychiatric: He has a normal mood and affect.    Assessment/Plan:  62 year old Caucasian male with hypertension, type 2 diabetes mellitus, hyperlipidemia, ischemic cardiomyopathy EF 20-25% status post ICD placement, coronary artery disease status post CABG 2 and multiple prior PCI's- most recently atherectomy and complex PCI to left main/left circumflex on 08/16/2017. Now admitted with pulmonary edema and non-STEMI  NSTEMI: No acute stent thrombosis. Recent left main/left circumflex stent is widely patent. Unchanged coronary anatomy with 1 out of 2 grafts patent and severe native vessel disease. Suspect this is secondary to acute decompensated heart failure. Continue aspirin and Brilinta, recommend heparin for 24-48 hours. Start heparin after femoral sheath removal  Pulmonary edema: Secondary to acute decompensated heart failure Continue nitroglycerin patch.  Acute on chronic HFrEF: No acute ischemic etiology identified. Triggers include dietary indiscretion and inadequate diuresis at baseline. BNP elevated at 1000. Recommend IV diuresis with go net negative 1-2 L/24 hours.  CAD: Stable, as described above.  Hypertension: Suboptimal. Continue Coreg 12.5 twice a day,  hydralazine 50 mg 3 times a day. May add spironolactone.  History of VT: Continue amiodarone 200 mg daily.  Type 2 diabetes mellitus: Continue insulin. Hold other agents. Resume metformin and Giardia and some discharge. Hypoglycemia protocol.   Onisha Cedeno J Emeri Estill 08/20/2017, 4:00 AM   [facility], [facility] Pager: [phone] Office: [phone] If no answer Cell [phone]

## 2017-08-20 NOTE — Progress Notes (Addendum)
62 year old Caucasian male with hypertension, type 2 diabetes mellitus, hyperlipidemia, ischemic cardiomyopathy EF 20-25% status post ICD placement, coronary artery disease status post CABG 2 and multiple prior PCI's.   Patient underwent atherectomy and complex PCI with overlapping stents in left main extending into left PDA with synergy 3.0 x 20 and 3.5 x 20 mm drug-eluting stents (08/16/2017). Patient started having shortness of breath on 08/19/2017 throughout the day, and worsened around midnight. He was brought by EMS to the ER. Patient is hemodynamically stable with blood pressure 140/90 mmHg. Chest x-ray showed pulmonary congestion. EKG shows Sinus tachycardia with V-paced rhythm. Troponin elevated at 1.24. Interrogation of patient's device does not show any new VT/VF events.  Patient's presentation is most likely due to pulmonary edema possibly related to hypertensive urgency. However, given his recently placed left main/left circumflex stent, acute stent thrombosis cannot be excluded. I've discussed the risks and benefits at length with patient and his wife. We will proceed with coronary and bypass graft angiogram with potential for coronary angioplasty, bypass graft angina plasty, hemodynamic support device placement. Patient is currently tolerating BiPAP well. I will contact critical care team in case he needs intubation and mechanical ventilation. I have discussed the case with tele ICU, in case he needs intubation.   Nigel Mormon, MD [facility] Pager: [phone] Office: [phone] If no answer Cell [phone]

## 2017-08-20 NOTE — ED Triage Notes (Signed)
Brought by ems from home for c/o increased SOB for two days.  Reports having 2 stents placed 4 days ago for a total of 12 stents.  Also reports pacer/defib has been firing.  [id number]

## 2017-08-20 NOTE — Progress Notes (Signed)
Pt transported from ED B19 to Cath Lab with no complications.

## 2017-08-20 NOTE — ED Provider Notes (Signed)
[facility] EMERGENCY DEPARTMENT Provider Note   CSN: [id number] Arrival date & time: 08/20/17  0024     History   Chief Complaint Chief Complaint  Patient presents with  . Shortness of Breath  . defib firing    HPI Martin Bush is a 62 y.o. male.  The history is provided by the EMS personnel. The history is limited by the condition of the patient.  Shortness of Breath  This is a recurrent problem. The average episode lasts 2 days. The problem occurs continuously.The current episode started more than 2 days ago. The problem has not changed since onset.Associated symptoms include PND, orthopnea, chest pain and leg swelling. Pertinent negatives include no fever and no vomiting. The problem's precipitants include medical treatment. He has tried nothing for the symptoms. The treatment provided no relief. He has had prior hospitalizations. He has had prior ED visits. Associated medical issues include past MI.  Patient with CAD status post stenting 4 days ago with SOB x 2 days.  No f/c/r.  Some leg swelling.    Past Medical History:  Diagnosis Date  . AICD (automatic cardioverter/defibrillator) present 2016  . Arthritis    "thumbs"  (08/02/2017)  . CHF (congestive heart failure) ([place])   . Colon polyps   . Coronary artery disease   . Depression   . High cholesterol   . Hypertension   . MI (myocardial infarction) ([place]) 2003  . OSA on CPAP   . Pneumonia 10/2014  . Proteinuria   . Type II diabetes mellitus (HCC)    insulin dependent    Patient Active Problem List   Diagnosis Date Noted  . Ventricular tachycardia ([place])   . ICD (implantable cardioverter-defibrillator) discharge 08/02/2017  . Obesity 06/17/2017  . CHF (congestive heart failure) ([place]) 07/23/2015  . Hyperlipidemia 07/11/2015  . Essential hypertension 07/11/2015  . CAD (coronary artery disease) 07/11/2015  . Depression 07/11/2015  . CKD (chronic kidney disease), stage III ([place]) 07/11/2015  .  OSA on CPAP 07/11/2015  . Lightheadedness 07/11/2015  . Carotid artery stenosis   . S/P eye surgery 05/15/2015  . Injury of left toe 04/27/2015  . Chronic low back pain 02/13/2015  . Diabetes ([place]) 02/13/2015  . Dizziness 02/05/2015  . NSTEMI (non-ST elevated myocardial infarction) ([place]) 07/16/2014    Past Surgical History:  Procedure Laterality Date  . CARDIAC CATHETERIZATION N/A 07/16/2014   Procedure: Left Heart Cath and Coronary Angiography;  Surgeon: Adrian Prows, MD;  Location: [facility];  Service: Cardiovascular;  Laterality: N/A;  . CARDIAC CATHETERIZATION  07/16/2014   Procedure: Coronary Balloon Angioplasty;  Surgeon: Adrian Prows, MD;  Location: [facility];  Service: Cardiovascular;;  . CARDIAC CATHETERIZATION  2003  . CARDIAC DEFIBRILLATOR PLACEMENT  2016  . CATARACT EXTRACTION W/ INTRAOCULAR LENS IMPLANT Left 03/2014  . CORONARY ANGIOPLASTY WITH STENT PLACEMENT     "I've got a total of 8 stents in there; mostly doine at [facility]" (08/02/2017)  . CORONARY ARTERY BYPASS GRAFT  ~ 2003   "CABG X2"; [facility]  . CORONARY ATHERECTOMY N/A 08/16/2017   Procedure: CORONARY ATHERECTOMY;  Surgeon: Nigel Mormon, MD;  Location: [facility];  Service: Cardiovascular;  Laterality: N/A;  . CORONARY BALLOON ANGIOPLASTY N/A 08/04/2017   Procedure: CORONARY BALLOON ANGIOPLASTY;  Surgeon: Adrian Prows, MD;  Location: [facility];  Service: Cardiovascular;  Laterality: N/A;  . CORONARY STENT INTERVENTION N/A 08/16/2017   Procedure: CORONARY STENT INTERVENTION;  Surgeon:  Patwardhan, Reynold Bowen, MD;  Location: [facility];  Service: Cardiovascular;  Laterality: N/A;  . ELBOW SURGERY Left ?2001   "pinched nerve"  . LEFT HEART CATH AND CORS/GRAFTS ANGIOGRAPHY N/A 08/04/2017   Procedure: LEFT HEART CATH AND CORS/GRAFTS ANGIOGRAPHY;  Surgeon: Adrian Prows, MD;  Location: [facility];  Service: Cardiovascular;  Laterality: N/A;        Home  Medications    Prior to Admission medications   Medication Sig Start Date End Date Taking? Authorizing Provider  amiodarone (PACERONE) 200 MG tablet Take 200 mg by mouth daily.  11/19/14   [provider]  aspirin EC 81 MG tablet Take 81 mg by mouth daily.    [provider]  carvedilol (COREG) 12.5 MG tablet Take 6.25 mg by mouth 2 (two) times daily with a meal.    [provider]  Cholecalciferol (VITAMIN D) 2000 units tablet Take 2,000 Units by mouth 2 (two) times daily.    [provider]  empagliflozin (JARDIANCE) 25 MG TABS tablet Take 25 mg by mouth daily.    [provider]  hydrALAZINE (APRESOLINE) 25 MG tablet Take 25 mg by mouth 2 (two) times daily.    [provider]  hydrocortisone 2.5 % cream Apply topically 2 (two) times daily. Right back for itching Patient taking differently: Apply 1 application topically 2 (two) times daily as needed (itching). Right back for itching 05/27/17   McLean-Scocuzza, Nino Glow, MD  insulin aspart protamine- aspart (NOVOLOG MIX 70/30) (70-30) 100 UNIT/ML injection Inject 60 Units into the skin 2 (two) times daily with a meal.     [provider]  isosorbide mononitrate (IMDUR) 30 MG 24 hr tablet Take 30 mg by mouth daily.    [provider]  Liraglutide (VICTOZA [place]) Inject 1.8 Units into the skin daily.    [provider]  metFORMIN (GLUCOPHAGE) 1000 MG tablet Take 1 tablet (1,000 mg total) by mouth 2 (two) times daily with a meal. 08/06/17   Adrian Prows, MD  mupirocin ointment (BACTROBAN) 2 % Apply 1 application topically 2 (two) times daily. To right back Patient not taking: Reported on 08/02/2017 05/27/17   McLean-Scocuzza, Nino Glow, MD  Omega-3 Fatty Acids (FISH OIL) 1000 MG CAPS Take 2,000 mg by mouth 3 (three) times daily.    [provider]  pravastatin (PRAVACHOL) 80 MG tablet Take 80 mg by mouth at bedtime.     [provider]  PRESCRIPTION MEDICATION  Inhale into the lungs at bedtime. CPAP    [provider]  sertraline (ZOLOFT) 100 MG tablet Take 50 mg by mouth daily.     [provider]  ticagrelor (BRILINTA) 90 MG TABS tablet Take 1 tablet (90 mg total) by mouth 2 (two) times daily. 08/05/17   Adrian Prows, MD    Family History Family History  Problem Relation Age of Onset  . Uterine cancer Mother   . Lung cancer Mother   . Hyperlipidemia Father   . Heart disease Father   . Hypertension Father   . Diabetes Father     Social History Social History   Tobacco Use  . Smoking status: Never Smoker  . Smokeless tobacco: Former Network engineer Use Topics  . Alcohol use: Yes    Comment: 08/02/2017 "maybe 1 drink/year"  . Drug use: Never     Allergies   Diltiazem   Review of Systems Review of Systems  Unable to perform ROS: Acuity of condition  Constitutional: Negative for fever.  Respiratory: Positive for shortness of breath.   Cardiovascular: Positive for chest pain, orthopnea, leg swelling and PND.  Gastrointestinal: Negative for vomiting.     Physical Exam Updated Vital Signs BP (!) 158/86   Pulse (!) 106   Temp 99.4 F (37.4 C) (Axillary)   Resp (!) 28   Ht 5\' 7"  (1.702 m)   Wt 97.1 kg (214 lb)   SpO2 100%   BMI 33.52 kg/m   Physical Exam  Constitutional: He appears well-developed and well-nourished.  HENT:  Head: Normocephalic.  Mouth/Throat: No oropharyngeal exudate.  Eyes: Pupils are equal, round, and reactive to light. Conjunctivae are normal.  Neck: Normal range of motion. Neck supple.  Cardiovascular: Normal rate, regular rhythm, normal heart sounds and intact distal pulses.  Pulmonary/Chest: Tachypnea noted. He is in respiratory distress. He has wheezes. He has rales.  Abdominal: Soft. Bowel sounds are normal. There is no tenderness.  Musculoskeletal: He exhibits edema.  Lymphadenopathy:    He has no cervical adenopathy.  Neurological: He is alert. He displays normal reflexes.   Skin: Skin is warm. Capillary refill takes less than 2 seconds. He is diaphoretic.  Psychiatric: He has a normal mood and affect.     ED Treatments / Results  Labs (all labs ordered are listed, but only abnormal results are displayed) Results for orders placed or performed during the hospital encounter of 08/20/17  CBC with Differential/Platelet  Result Value Ref Range   WBC 15.1 (H) 4.0 - 10.5 K/uL   RBC 4.43 4.22 - 5.81 MIL/uL   Hemoglobin 13.2 13.0 - 17.0 g/dL   HCT 40.8 39.0 - 52.0 %   MCV 92.1 78.0 - 100.0 fL   MCH 29.8 26.0 - 34.0 pg   MCHC 32.4 30.0 - 36.0 g/dL   RDW 12.9 11.5 - 15.5 %   Platelets 218 150 - 400 K/uL   Neutrophils Relative % 86 %   Neutro Abs 13.0 (H) 1.7 - 7.7 K/uL   Lymphocytes Relative 3 %   Lymphs Abs 0.4 (L) 0.7 - 4.0 K/uL   Monocytes Relative 8 %   Monocytes Absolute 1.1 (H) 0.1 - 1.0 K/uL   Eosinophils Relative 3 %   Eosinophils Absolute 0.4 0.0 - 0.7 K/uL   Basophils Relative 1 %   Basophils Absolute 0.1 0.0 - 0.1 K/uL   Immature Granulocytes 1 %   Abs Immature Granulocytes 0.1 0.0 - 0.1 K/uL  Brain natriuretic peptide  Result Value Ref Range   B Natriuretic Peptide 1,061.6 (H) 0.0 - 100.0 pg/mL  I-Stat Chem 8, ED  Result Value Ref Range   Sodium 134 (L) 135 - 145 mmol/L   Potassium 3.7 3.5 - 5.1 mmol/L   Chloride 100 98 - 111 mmol/L   BUN 20 8 - 23 mg/dL   Creatinine, Ser 1.00 0.61 - 1.24 mg/dL   Glucose, Bld 165 (H) 70 - 99 mg/dL   Calcium, Ion 1.15 1.15 - 1.40 mmol/L   TCO2 21 (L) 22 - 32 mmol/L   Hemoglobin 14.3 13.0 - 17.0 g/dL   HCT 42.0 39.0 - 52.0 %  I-stat troponin, ED  Result Value Ref Range   Troponin i, poc 1.24 (HH) 0.00 - 0.08 ng/mL   Comment NOTIFIED PHYSICIAN    Comment 3          I-Stat arterial blood gas, ED  Result Value Ref Range   pH, Arterial 7.352 7.350 - 7.450   pCO2 arterial 38.9 32.0 -  48.0 mmHg   pO2, Arterial 110.0 (H) 83.0 - 108.0 mmHg   Bicarbonate 21.6 20.0 - 28.0 mmol/L   TCO2 23 22 - 32 mmol/L    O2 Saturation 98.0 %   Acid-base deficit 4.0 (H) 0.0 - 2.0 mmol/L   Patient temperature HIDE    Collection site RADIAL, ALLEN'S TEST ACCEPTABLE    Drawn by RT    Sample type ARTERIAL    Dg Chest 2 View  Result Date: 08/02/2017 CLINICAL DATA:  Shortness of breath. EXAM: CHEST - 2 VIEW COMPARISON:  Radiographs of February 05, 2015. FINDINGS: Mild cardiomegaly is noted. Status post coronary artery bypass graft. Left-sided pacemaker is unchanged in position. No pneumothorax or pleural effusion is noted. No acute pulmonary disease is noted. Bony thorax is unremarkable. IMPRESSION: No active cardiopulmonary disease. Electronically Signed   By: Marijo Conception, M.D.   On: 08/02/2017 15:25   Dg Chest Portable 1 View  Result Date: 08/20/2017 CLINICAL DATA:  Increased shortness of breath for 2 days. Coronary stents placed 4 days ago. Pacemaker has been firing. EXAM: PORTABLE CHEST 1 VIEW COMPARISON:  08/02/2017 FINDINGS: Postoperative changes in the mediastinum. Cardiac pacemaker. Heart size is enlarged. Moderate pulmonary vascular congestion. Perihilar and basilar interstitial changes consistent with edema. Progression since previous study. No pleural effusions. No pneumothorax. Mediastinal contours appear intact. IMPRESSION: Cardiac enlargement with pulmonary vascular congestion and interstitial edema, developing since previous study. Electronically Signed   By: Lucienne Capers M.D.   On: 08/20/2017 00:51    EKG EKG Interpretation  Date/Time:  Saturday August 20 2017 00:31:04 EDT Ventricular Rate:  104 PR Interval:    QRS Duration: 164 QT Interval:  369 QTC Calculation: 486 R Axis:   -124 Text Interpretation:  Sinus tachycardia Nonspecific intraventricular conduction delay Inferior infarct, old Anterior infarct, old Baseline wander in lead(s) V4 Confirmed by Randal Buba, Jhoel Stieg ([id number]) on 08/20/2017 12:44:59 AM   Radiology Dg Chest Portable 1 View  Result Date: 08/20/2017 CLINICAL DATA:  Increased  shortness of breath for 2 days. Coronary stents placed 4 days ago. Pacemaker has been firing. EXAM: PORTABLE CHEST 1 VIEW COMPARISON:  08/02/2017 FINDINGS: Postoperative changes in the mediastinum. Cardiac pacemaker. Heart size is enlarged. Moderate pulmonary vascular congestion. Perihilar and basilar interstitial changes consistent with edema. Progression since previous study. No pleural effusions. No pneumothorax. Mediastinal contours appear intact. IMPRESSION: Cardiac enlargement with pulmonary vascular congestion and interstitial edema, developing since previous study. Electronically Signed   By: Lucienne Capers M.D.   On: 08/20/2017 00:51    Procedures Procedures (including critical care time)  Medications Ordered in ED Medications  nitroGLYCERIN (NITROGLYN) 2 % ointment 0.5 inch (has no administration in time range)  furosemide (LASIX) injection 40 mg (40 mg Intravenous Given 08/20/17 0145)    MDM Interpretation: labs, ECG and x-ray (pulmonary edema by me elevated BNP and troponin) Total time providing critical care: 30-74 minutes (bipap initiated by me). This excludes time spent performing separately reportable procedures and services. Consults: cardiology   CRITICAL CARE Performed by: Carlisle Beers Total critical care time: 60 minutes Critical care time was exclusive of separately billable procedures and treating other patients. Critical care was necessary to treat or prevent imminent or life-threatening deterioration. Critical care was time spent personally by me on the following activities: development of treatment plan with patient and/or surrogate as well as nursing, discussions with consultants, evaluation of patient's response to treatment, examination of patient, obtaining history from patient or surrogate, ordering and performing treatments and interventions,  ordering and review of laboratory studies, ordering and review of radiographic studies, pulse oximetry and  re-evaluation of patient's condition. Final Clinical Impressions(s) / ED Diagnoses   Final diagnoses:  Acute pulmonary edema (HCC)  NSTEMI (non-ST elevated myocardial infarction) [facility])   Admit to cardiology to be seen in the ED   Shawna Wearing, MD 08/20/17 0200

## 2017-08-20 NOTE — Progress Notes (Signed)
Patient appears much more comfortable without chest pain, shortness of breath. No tachypnea. 2.6 L diuresis in 12 hours. Transfer to telemetry. Recommend ACS dose lovenox. Continue rest of the medical managment.  Nigel Mormon, MD [facility] Pager: [phone] Office: [phone] If no answer Cell [phone]

## 2017-08-20 NOTE — Progress Notes (Signed)
Critical care time: 90 min 08/20/17 early AM  [place], MD [facility] Pager: [phone] Office: [phone] If no answer Cell [phone]

## 2017-08-20 NOTE — ED Notes (Signed)
Placed on our cpap on arrival.  Tolerating well so far.

## 2017-08-20 NOTE — Progress Notes (Signed)
Unscheduled device interrogation 08/20/2017: Medtronic BiV ICD. Amplia MRI Quad CRTD 95% pcaed. BiV 57% Longevity 5.9 years. Last sustained VT episode 08/03/17, nonsustained VT 08/14/2017 Thresholds stable OptiVol indicated volume overload

## 2017-08-20 NOTE — Progress Notes (Signed)
ABG obtained.  Results given to ER MD

## 2017-08-20 NOTE — ED Notes (Signed)
Dr Randal Buba informed of troponin results 1.24

## 2017-08-20 NOTE — Progress Notes (Signed)
Pt refused Novolog 70/30.  States he does not take 60 units for CBG 112.  He is aware that he is not on oral hyperglycemic agents currently.  He requested CBG to be rechecked in 2 hours.  Idolina Primer, RN

## 2017-08-20 NOTE — H&P (Signed)
Reason for Consult: Pulmonary edema, NSTEMI Referring Physician: [facility]  Torion Hulgan is an 62 y.o. male.  HPI:   62 year old Caucasian male with hypertension, type 2 diabetes mellitus, hyperlipidemia, ischemic cardiomyopathy EF 20-25% status post ICD placement, coronary artery disease status post CABG 2 and multiple prior PCI's.  Patient underwent atherectomy and complex PCI with overlapping stents in left main extending into left PDA with synergy 3.0 x 20 and 3.5 x 20 mm drug-eluting stents (08/16/2017). Patient started having shortness of breath on 08/19/2017 throughout the day, and worsened around midnight. He was brought by EMS to the ER. Patient is hemodynamically stable with blood pressure 140/90 mmHg. Chest x-ray showed pulmonary congestion. EKG showsSinus tachycardia with V-paced rhythm.Troponin elevated at 1.24. Interrogation of patient's device does not show any new VT/VF events.  Patient was taken to cath lab emergently to rule out acute stent thrombosis as etiology for his acute presentation. Cath showed patent stents and unchanged coronary and bypass graft anatomy compared to recent cath. LVEDP was elevated at 28 mmHg.      Past Medical History:  Diagnosis Date  . AICD (automatic cardioverter/defibrillator) present 2016  . Arthritis    "thumbs"  (08/02/2017)  . CHF (congestive heart failure) ([place])   . Colon polyps   . Coronary artery disease   . Depression   . High cholesterol   . Hypertension   . MI (myocardial infarction) ([place]) 2003  . OSA on CPAP   . Pneumonia 10/2014  . Proteinuria   . Type II diabetes mellitus (HCC)    insulin dependent         Past Surgical History:  Procedure Laterality Date  . CARDIAC CATHETERIZATION N/A 07/16/2014   Procedure: Left Heart Cath and Coronary Angiography;  Surgeon: Adrian Prows, MD;  Location: [place] [facility];  Service: Cardiovascular;  Laterality: N/A;  . CARDIAC CATHETERIZATION  07/16/2014    Procedure: Coronary Balloon Angioplasty;  Surgeon: Adrian Prows, MD;  Location: [place] [facility];  Service: Cardiovascular;;  . CARDIAC CATHETERIZATION  2003  . CARDIAC DEFIBRILLATOR PLACEMENT  2016  . CATARACT EXTRACTION W/ INTRAOCULAR LENS IMPLANT Left 03/2014  . CORONARY ANGIOPLASTY WITH STENT PLACEMENT     "I've got a total of 8 stents in there; mostly doine at [facility]" (08/02/2017)  . CORONARY ARTERY BYPASS GRAFT  ~ 2003   "CABG X2"; [facility]  . CORONARY ATHERECTOMY N/A 08/16/2017   Procedure: CORONARY ATHERECTOMY;  Surgeon: Nigel Mormon, MD;  Location: [place] [facility];  Service: Cardiovascular;  Laterality: N/A;  . CORONARY BALLOON ANGIOPLASTY N/A 08/04/2017   Procedure: CORONARY BALLOON ANGIOPLASTY;  Surgeon: Adrian Prows, MD;  Location: [place] [facility];  Service: Cardiovascular;  Laterality: N/A;  . CORONARY STENT INTERVENTION N/A 08/16/2017   Procedure: CORONARY STENT INTERVENTION;  Surgeon: Nigel Mormon, MD;  Location: [place] [facility];  Service: Cardiovascular;  Laterality: N/A;  . ELBOW SURGERY Left ?2001   "pinched nerve"  . LEFT HEART CATH AND CORS/GRAFTS ANGIOGRAPHY N/A 08/04/2017   Procedure: LEFT HEART CATH AND CORS/GRAFTS ANGIOGRAPHY;  Surgeon: Adrian Prows, MD;  Location: [place] [facility];  Service: Cardiovascular;  Laterality: N/A;         Family History  Problem Relation Age of Onset  . Uterine cancer Mother   . Lung cancer Mother   . Hyperlipidemia Father   . Heart disease Father   . Hypertension Father   . Diabetes Father     Social History:  reports that he has never smoked. He quit smokeless tobacco use about 17 years ago. He reports that he drinks alcohol. He reports that he does not use drugs.  Allergies:      Allergies  Allergen Reactions  . Diltiazem Rash    Medications: I have reviewed the patient's current medications.  LabResultsLast48Hours        Results for orders placed or performed  during the hospital encounter of 08/20/17 (from the past 48 hour(s))  CBC with Differential/Platelet     Status: Abnormal   Collection Time: 08/20/17 12:30 AM  Result Value Ref Range   WBC 15.1 (H) 4.0 - 10.5 K/uL   RBC 4.43 4.22 - 5.81 MIL/uL   Hemoglobin 13.2 13.0 - 17.0 g/dL   HCT 40.8 39.0 - 52.0 %   MCV 92.1 78.0 - 100.0 fL   MCH 29.8 26.0 - 34.0 pg   MCHC 32.4 30.0 - 36.0 g/dL   RDW 12.9 11.5 - 15.5 %   Platelets 218 150 - 400 K/uL   Neutrophils Relative % 86 %   Neutro Abs 13.0 (H) 1.7 - 7.7 K/uL   Lymphocytes Relative 3 %   Lymphs Abs 0.4 (L) 0.7 - 4.0 K/uL   Monocytes Relative 8 %   Monocytes Absolute 1.1 (H) 0.1 - 1.0 K/uL   Eosinophils Relative 3 %   Eosinophils Absolute 0.4 0.0 - 0.7 K/uL   Basophils Relative 1 %   Basophils Absolute 0.1 0.0 - 0.1 K/uL   Immature Granulocytes 1 %   Abs Immature Granulocytes 0.1 0.0 - 0.1 K/uL    Comment: Performed at [facility], [address] [place], [place] [address]  Brain natriuretic peptide     Status: Abnormal   Collection Time: 08/20/17 12:30 AM  Result Value Ref Range   B Natriuretic Peptide 1,061.6 (H) 0.0 - 100.0 pg/mL    Comment: Performed at [facility] [address] [place], [place] [address]  I-stat troponin, ED     Status: Abnormal   Collection Time: 08/20/17 12:33 AM  Result Value Ref Range   Troponin i, poc 1.24 (HH) 0.00 - 0.08 ng/mL   Comment NOTIFIED PHYSICIAN    Comment 3            Comment: Due to the release kinetics of cTnI, a negative result within the first hours of the onset of symptoms does not rule out myocardial infarction with certainty. If myocardial infarction is still suspected, repeat the test at appropriate intervals.   I-Stat Chem 8, ED     Status: Abnormal   Collection Time: 08/20/17 12:36 AM  Result Value Ref Range   Sodium 134 (L) 135 - 145 mmol/L   Potassium 3.7 3.5 - 5.1 mmol/L   Chloride 100 98 - 111 mmol/L   BUN 20 8  - 23 mg/dL   Creatinine, Ser 1.00 0.61 - 1.24 mg/dL   Glucose, Bld 165 (H) 70 - 99 mg/dL   Calcium, Ion 1.15 1.15 - 1.40 mmol/L   TCO2 21 (L) 22 - 32 mmol/L   Hemoglobin 14.3 13.0 - 17.0 g/dL   HCT 42.0 39.0 - 52.0 %  I-Stat arterial blood gas, ED     Status: Abnormal   Collection Time: 08/20/17  1:19 AM  Result Value Ref Range   pH, Arterial 7.352 7.350 - 7.450   pCO2 arterial 38.9 32.0 - 48.0 mmHg   pO2, Arterial 110.0 (H) 83.0 - 108.0 mmHg   Bicarbonate 21.6  20.0 - 28.0 mmol/L   TCO2 23 22 - 32 mmol/L   O2 Saturation 98.0 %   Acid-base deficit 4.0 (H) 0.0 - 2.0 mmol/L   Patient temperature HIDE    Collection site RADIAL, ALLEN'S TEST ACCEPTABLE    Drawn by RT    Sample type ARTERIAL        ImagingResults(Last48hours)  Dg Chest Portable 1 View  Result Date: 08/20/2017 CLINICAL DATA:  Increased shortness of breath for 2 days. Coronary stents placed 4 days ago. Pacemaker has been firing. EXAM: PORTABLE CHEST 1 VIEW COMPARISON:  08/02/2017 FINDINGS: Postoperative changes in the mediastinum. Cardiac pacemaker. Heart size is enlarged. Moderate pulmonary vascular congestion. Perihilar and basilar interstitial changes consistent with edema. Progression since previous study. No pleural effusions. No pneumothorax. Mediastinal contours appear intact. IMPRESSION: Cardiac enlargement with pulmonary vascular congestion and interstitial edema, developing since previous study. Electronically Signed   By: Lucienne Capers M.D.   On: 08/20/2017 00:51     Review of Systems  Constitutional: Negative for chills and fever.  HENT: Negative.   Respiratory: Positive for sputum production and shortness of breath.   Cardiovascular: Negative for chest pain and palpitations.  Gastrointestinal: Negative.   Genitourinary: Negative.   Musculoskeletal: Negative.   Neurological: Negative for loss of consciousness.  Endo/Heme/Allergies: Does not bruise/bleed easily.   Psychiatric/Behavioral: Negative.   All other systems reviewed and are negative.  Blood pressure (!) 152/97, pulse 91, temperature 99.4 F (37.4 C), temperature source Axillary, resp. rate (!) 22, height 5\' 7"  (1.702 m), weight 97.1 kg (214 lb), SpO2 96 %. Physical Exam  Nursing note and vitals reviewed. Constitutional: He is oriented to person, place, and time. He appears well-developed and well-nourished. He appears distressed.  HENT:  Head: Normocephalic and atraumatic.  Cardiovascular: S1 normal, S2 normal and intact distal pulses. Tachycardia present.  Respiratory: Accessory muscle usage present. Tachypnea noted. He is in respiratory distress. He has decreased breath sounds in the right lower field and the left lower field. He has wheezes.  Musculoskeletal: He exhibits edema (2+).  Neurological: He is alert and oriented to person, place, and time. No cranial nerve deficit.  Skin: Skin is warm and dry.  Psychiatric: He has a normal mood and affect.    Assessment/Plan:  62 year old Caucasian male with hypertension, type 2 diabetes mellitus, hyperlipidemia, ischemic cardiomyopathy EF 20-25% status post ICD placement, coronary artery disease status post CABG 2 and multiple prior PCI's- most recently atherectomy and complex PCI to left main/left circumflex on 08/16/2017. Now admitted with pulmonary edema and non-STEMI  NSTEMI: No acute stent thrombosis. Recent left main/left circumflex stent is widely patent. Unchanged coronary anatomy with 1 out of 2 grafts patent and severe native vessel disease. Suspect this is secondary to acute decompensated heart failure. Continue aspirin and Brilinta, recommend heparin for 24-48 hours. Start heparin after femoral sheath removal  Pulmonary edema: Secondary to acute decompensated heart failure Continue nitroglycerin patch.  Acute on chronic HFrEF: Patient is now off BiPAP and tolerating room air No acute ischemic etiology identified.  Triggers include dietary indiscretion and inadequate diuresis at baseline. BNP elevated at 1000. Recommend IV diuresis with go net negative 1-2 L/24 hours.  CAD: Stable, as described above.  Hypertension: Suboptimal. Continue Coreg 12.5 twice a day, hydralazine 50 mg 3 times a day. May add spironolactone.  History of VT: Continue amiodarone 200 mg daily.  Type 2 diabetes mellitus: Continue insulin. Hold other agents. Resume metformin and Giardia and some discharge. Hypoglycemia protocol.  Affan Callow J Tavionna Grout 08/20/2017, 4:00 AM   [place], MD [facility] Pager: [phone] Office: [phone] If no answer Cell [phone]

## 2017-08-21 ENCOUNTER — Encounter (HOSPITAL_COMMUNITY): Payer: Self-pay | Admitting: Cardiology

## 2017-08-21 LAB — BASIC METABOLIC PANEL
Anion gap: 12 (ref 5–15)
BUN: 24 mg/dL — ABNORMAL HIGH (ref 8–23)
CO2: 27 mmol/L (ref 22–32)
Calcium: 8.8 mg/dL — ABNORMAL LOW (ref 8.9–10.3)
Chloride: 97 mmol/L — ABNORMAL LOW (ref 98–111)
Creatinine, Ser: 1.39 mg/dL — ABNORMAL HIGH (ref 0.61–1.24)
GFR calc Af Amer: >60 mL/min (ref >60)
GFR calc non Af Amer: 53 mL/min — ABNORMAL LOW (ref >60)
Glucose, Bld: 87 mg/dL (ref 70–99)
Potassium: 3.3 mmol/L — ABNORMAL LOW (ref 3.5–5.1)
Sodium: 136 mmol/L (ref 135–145)

## 2017-08-21 LAB — MAGNESIUM: Magnesium: 1.9 mg/dL (ref 1.7–2.4)

## 2017-08-21 LAB — BRAIN NATRIURETIC PEPTIDE: B Natriuretic Peptide: 1003 pg/mL — ABNORMAL HIGH (ref 0.0–100.0)

## 2017-08-21 LAB — GLUCOSE, CAPILLARY
Glucose-Capillary: 87 mg/dL (ref 70–99)
Glucose-Capillary: 135 mg/dL — ABNORMAL HIGH (ref 70–99)

## 2017-08-21 MED ORDER — POTASSIUM CHLORIDE CRYS ER 20 MEQ PO TBCR
40.0000 meq | EXTENDED_RELEASE_TABLET | Freq: Once | ORAL | Status: AC
  Administered 2017-08-21: 40 meq via ORAL
  Filled 2017-08-21: qty 2

## 2017-08-21 MED ORDER — MAGNESIUM SULFATE 2 GM/50ML IV SOLN
2.0000 g | Freq: Once | INTRAVENOUS | Status: AC
  Administered 2017-08-21: 2 g via INTRAVENOUS
  Filled 2017-08-21: qty 50

## 2017-08-21 NOTE — Progress Notes (Signed)
SATURATION QUALIFICATIONS: (This note is used to comply with regulatory documentation for home oxygen)  Patient Saturations on Room Air at Rest = 96%  Patient Saturations on Room Air while Ambulating = 91%  Patient Saturations on NA Liters of oxygen while Ambulating = NA  Please briefly explain why patient needs home oxygen: Does not qualify

## 2017-08-21 NOTE — Discharge Summary (Signed)
Physician Discharge Summary  Patient ID: Martin Bush MRN: [id number] DOB/AGE: 1955-06-29 62 y.o.  Admit date: 08/20/2017 Discharge date: 08/21/2017  Admission Diagnoses: NSTEMI Acute on chronic systolic heart failure Flash pulmonary edema  Discharge Diagnoses:  Active Problems:   NSTEMI (non-ST elevated myocardial infarction) [facility])   Pulmonary edema   Discharged Condition: good  Hospital Course:   Patient was admitted with acute shortness of breath, found ot have flash pulmonary edema and NSTEMI. Cath showed patent stent, no change in coronary and by[ass graft angiography compared to recent angiography. Patient was diuresed with IV lasix 40 mg tid, with significant improvement in symptoms. Patient ambulated without symptoms or hypoxia on discharge.  On discharge day, his creatinine had increased from 1.0 to 1.39. However, patient had excellent urine output. Will arrange outpatient BMP check and office visit in the coming week. If creatinine improved, could consider adding spironolactone. Given AKI, I have not discharged him on any diuretics.   Also, given his flash pulmonary edema presentation, could consider workup for renal artery stenosis.  I have counseled the patient regarding salt restriction, daily weight checks.   Consults: None  Significant Diagnostic Studies:  Results for TAYLIN, LEDER (MRN [id number]) as of 08/21/2017 13:58  Ref. Range 08/20/2017 00:30 08/20/2017 00:33 08/20/2017 00:36 08/21/2017 37:85  BASIC METABOLIC PANEL Unknown    Rpt (A)  Sodium Latest Ref Range: 135 - 145 mmol/L   134 (L) 136  Potassium Latest Ref Range: 3.5 - 5.1 mmol/L   3.7 3.3 (L)  Chloride Latest Ref Range: 98 - 111 mmol/L   100 97 (L)  CO2 Latest Ref Range: 22 - 32 mmol/L    27  Glucose Latest Ref Range: 70 - 99 mg/dL   165 (H) 87  BUN Latest Ref Range: 8 - 23 mg/dL   20 24 (H)  Creatinine Latest Ref Range: 0.61 - 1.24 mg/dL   1.00 1.39 (H)  Calcium Latest Ref Range: 8.9 - 10.3 mg/dL     8.8 (L)  Anion gap Latest Ref Range: 5 - 15     12  Calcium Ionized Latest Ref Range: 1.15 - 1.40 mmol/L   1.15   Magnesium Latest Ref Range: 1.7 - 2.4 mg/dL    1.9  GFR, Est Non African American Latest Ref Range: >60 mL/min    53 (L)  GFR, Est African American Latest Ref Range: >60 mL/min    >60  B Natriuretic Peptide Latest Ref Range: 0.0 - 100.0 pg/mL 1,061.6 (H)   1,003.0 (H)  Troponin i, poc Latest Ref Range: 0.00 - 0.08 ng/mL  1.24 (HH)      Treatments: IV diresis  Discharge Exam: Blood pressure 129/82, pulse 79, temperature 98.9 F (37.2 C), temperature source Oral, resp. rate 19, height 5\' 7"  (1.702 m), weight 96.4 kg (212 lb 9.6 oz), SpO2 95 %.  Nursing noteand vitalsreviewed. Constitutional: He isoriented to person, place, and time. He appearswell-developedand well-nourished. He appears in no distress  HENT:  Head:Normocephalicand atraumatic.  Cardiovascular:S1 normal,S2 normaland intact distal pulses.[name] Kitchen  Respiratory:Bilateral normal air entry. No rales. No use os accessory muscles.   Musculoskeletal: He exhibits no edema.  Neurological: He isalertand oriented to person, place, and time. Nocranial nerve deficit.  Skin: Skin iswarmand dry.  Psychiatric: He has anormal mood and affect.   Disposition: Discharge disposition: 01-Home or Self Care       Discharge Instructions    Diet - low sodium heart healthy   Complete by:  As directed  Increase activity slowly   Complete by:  As directed      Allergies as of 08/21/2017      Reactions   Diltiazem Rash      Medication List    TAKE these medications   amiodarone 200 MG tablet Commonly known as:  PACERONE Take 200 mg by mouth daily.   aspirin EC 81 MG tablet Take 81 mg by mouth daily.   carvedilol 12.5 MG tablet Commonly known as:  COREG Take 6.25 mg by mouth 2 (two) times daily with a meal.   Fish Oil 1000 MG Caps Take 2,000 mg by mouth 3 (three) times daily.   hydrALAZINE 25 MG  tablet Commonly known as:  APRESOLINE Take 25 mg by mouth 2 (two) times daily.   hydrocortisone 2.5 % cream Apply topically 2 (two) times daily. Right back for itching What changed:    how much to take  when to take this  reasons to take this  additional instructions   insulin aspart protamine- aspart (70-30) 100 UNIT/ML injection Commonly known as:  NOVOLOG MIX 70/30 Inject 30 Units into the skin 2 (two) times daily with a meal.   isosorbide mononitrate 30 MG 24 hr tablet Commonly known as:  IMDUR Take 30 mg by mouth daily.   JARDIANCE 25 MG Tabs tablet Generic drug:  empagliflozin Take 25 mg by mouth daily.   metFORMIN 1000 MG tablet Commonly known as:  GLUCOPHAGE Take 1 tablet (1,000 mg total) by mouth 2 (two) times daily with a meal.   mupirocin ointment 2 % Commonly known as:  BACTROBAN Apply 1 application topically 2 (two) times daily. To right back   pravastatin 80 MG tablet Commonly known as:  PRAVACHOL Take 80 mg by mouth at bedtime.   PRESCRIPTION MEDICATION Inhale into the lungs at bedtime. CPAP   sertraline 100 MG tablet Commonly known as:  ZOLOFT Take 50 mg by mouth daily.   ticagrelor 90 MG Tabs tablet Commonly known as:  BRILINTA Take 1 tablet (90 mg total) by mouth 2 (two) times daily.   VICTOZA [place] Inject 1.8 Units into the skin daily.   Vitamin D 2000 units tablet Take 2,000 Units by mouth 2 (two) times daily.        SignedNigel Mormon 08/21/2017, 1:52 PM  [place], MD [facility] Pager: [phone] Office: [phone] If no answer Cell [phone]

## 2017-08-21 NOTE — Progress Notes (Signed)
ANTICOAGULATION CONSULT NOTE - Initial Consult  Pharmacy Consult for Lovenox Indication: chest pain/ACS; s/p cath  Allergies  Allergen Reactions  . Diltiazem Rash    Patient Measurements: Height: 5\' 7"  (170.2 cm) Weight: 212 lb 9.6 oz (96.4 kg) IBW/kg (Calculated) : 66.1   Vital Signs: Temp: 98.9 F (37.2 C) (07/28 0450) Temp Source: Oral (07/28 0450) BP: 129/82 (07/28 0908) Pulse Rate: 79 (07/28 0908)  Labs: Recent Labs    08/20/17 0030 08/20/17 0036 08/21/17 0646  HGB 13.2 14.3  --   HCT 40.8 42.0  --   PLT 218  --   --   CREATININE  --  1.00 1.39*    Estimated Creatinine Clearance: 61.7 mL/min (A) (by C-G formula based on SCr of 1.39 mg/dL (H)).   Medical History: Past Medical History:  Diagnosis Date  . AICD (automatic cardioverter/defibrillator) present 2016  . Arthritis    "thumbs"  (08/02/2017)  . CHF (congestive heart failure) ([place])   . Colon polyps   . Coronary artery disease   . Depression   . High cholesterol   . Hypertension   . MI (myocardial infarction) ([place]) 2003  . OSA on CPAP   . Pneumonia 10/2014  . Proteinuria   . Type II diabetes mellitus (HCC)    insulin dependent    Medications:  Scheduled:  . amiodarone  200 mg Oral Daily  . aspirin EC  81 mg Oral Daily  . carvedilol  6.25 mg Oral BID WC  . enoxaparin (LOVENOX) injection  1 mg/kg Subcutaneous Q12H  . hydrALAZINE  25 mg Oral BID  . insulin aspart  0-15 Units Subcutaneous TID WC  . insulin aspart protamine- aspart  60 Units Subcutaneous BID WC  . isosorbide mononitrate  30 mg Oral Daily  . mouth rinse  15 mL Mouth Rinse BID  . pravastatin  80 mg Oral QHS  . sertraline  50 mg Oral Daily  . sodium chloride flush  3 mL Intravenous Q12H  . ticagrelor  90 mg Oral BID   Infusions:  . sodium chloride 250 mL (08/21/17 0911)    Assessment: 62 yo M with presenting with CP and shortness of breath s/p cath on 7/23. Started on heparin initially which has been d/ced. PMH includes  HTN, T2DM, HLD, CABGx2.  Pharmacy consulted to dose Lovenox for ACS medical managment.   CBC stable. No bleeding noted.   Goal of Therapy:  Monitor platelets by anticoagulation protocol: Yes   Plan:  Continue Lovenox 100mg  every 12 hours  Monitor CBC and s/s bleeding   Gwenlyn Found, Sherian Rein D PGY1 Pharmacy Resident  Phone [phone] 08/21/2017   12:09 PM

## 2017-08-21 NOTE — Discharge Instructions (Signed)
Angiogram, Care After °This sheet gives you information about how to care for yourself after your procedure. Your doctor may also give you more specific instructions. If you have problems or questions, contact your doctor. °Follow these instructions at home: °Insertion site care °· Follow instructions from your doctor about how to take care of your long, thin tube (catheter) insertion area. Make sure you: °? Wash your hands with soap and water before you change your bandage (dressing). If you cannot use soap and water, use hand sanitizer. °? Change your bandage as told by your doctor. °? Leave stitches (sutures), skin glue, or skin tape (adhesive) strips in place. They may need to stay in place for 2 weeks or longer. If tape strips get loose and curl up, you may trim the loose edges. Do not remove tape strips completely unless your doctor says it is okay. °· Do not take baths, swim, or use a hot tub until your doctor says it is okay. °· You may shower 24-48 hours after the procedure or as told by your doctor. °? Gently wash the area with plain soap and water. °? Pat the area dry with a clean towel. °? Do not rub the area. This may cause bleeding. °· Do not apply powder or lotion to the area. Keep the area clean and dry. °· Check your insertion area every day for signs of infection. Check for: °? More redness, swelling, or pain. °? Fluid or blood. °? Warmth. °? Pus or a bad smell. °Activity °· Rest as told by your doctor, usually for 1-2 days. °· Do not lift anything that is heavier than 10 lbs. (4.5 kg) or as told by your doctor. °· Do not drive for 24 hours if you were given a medicine to help you relax (sedative). °· Do not drive or use heavy machinery while taking prescription pain medicine. °General instructions °· Go back to your normal activities as told by your doctor, usually in about a week. Ask your doctor what activities are safe for you. °· If the insertion area starts to bleed, lie flat and put pressure  on the area. If the bleeding does not stop, get help right away. This is an emergency. °· Drink enough fluid to keep your pee (urine) clear or pale yellow. °· Take over-the-counter and prescription medicines only as told by your doctor. °· Keep all follow-up visits as told by your doctor. This is important. °Contact a doctor if: °· You have a fever. °· You have chills. °· You have more redness, swelling, or pain around your insertion area. °· You have fluid or blood coming from your insertion area. °· The insertion area feels warm to the touch. °· You have pus or a bad smell coming from your insertion area. °· You have more bruising around the insertion area. °· Blood collects in the tissue around the insertion area (hematoma) that may be painful to the touch. °Get help right away if: °· You have a lot of pain in the insertion area. °· The insertion area swells very fast. °· The insertion area is bleeding, and the bleeding does not stop after holding steady pressure on the area. °· The area near or just beyond the insertion area becomes pale, cool, tingly, or numb. °These symptoms may be an emergency. Do not wait to see if the symptoms will go away. Get medical help right away. Call your local emergency services (911 in the [place] Do not drive yourself to the hospital. °Summary °·   After the procedure, it is common to have bruising and tenderness at the long, thin tube insertion area.  After the procedure, it is important to rest and drink plenty of fluids.  Do not take baths, swim, or use a hot tub until your doctor says it is okay to do so. You may shower 24-48 hours after the procedure or as told by your doctor.  If the insertion area starts to bleed, lie flat and put pressure on the area. If the bleeding does not stop, get help right away. This is an emergency. This information is not intended to replace advice given to you by your health care provider. Make sure you discuss any questions you have with  your health care provider. Document Released: 04/09/2008 Document Revised: 01/06/2016 Document Reviewed: 01/06/2016 Elsevier Interactive Patient Education  2017 [place] Heart-healthy meal planning includes:  Limiting unhealthy fats.  Increasing healthy fats.  Making other small dietary changes.  You may need to talk with your doctor or a diet specialist (dietitian) to create an eating plan that is right for you. What types of fat should I choose?  Choose healthy fats. These include olive oil and canola oil, flaxseeds, walnuts, almonds, and seeds.  Eat more omega-3 fats. These include salmon, mackerel, sardines, tuna, flaxseed oil, and ground flaxseeds. Try to eat fish at least twice each week.  Limit saturated fats. ? Saturated fats are often found in animal products, such as meats, butter, and cream. ? Plant sources of saturated fats include palm oil, palm kernel oil, and coconut oil.  Avoid foods with partially hydrogenated oils in them. These include stick margarine, some tub margarines, cookies, crackers, and other baked goods. These contain trans fats. What general guidelines do I need to follow?  Check food labels carefully. Identify foods with trans fats or high amounts of saturated fat.  Fill one half of your plate with vegetables and green salads. Eat 4-5 servings of vegetables per day. A serving of vegetables is: ? 1 cup of raw leafy vegetables. ?  cup of raw or cooked cut-up vegetables. ?  cup of vegetable juice.  Fill one fourth of your plate with whole grains. Look for the word "whole" as the first word in the ingredient list.  Fill one fourth of your plate with lean protein foods.  Eat 4-5 servings of fruit per day. A serving of fruit is: ? One medium whole fruit. ?  cup of dried fruit. ?  cup of fresh, frozen, or canned fruit. ?  cup of 100% fruit juice.  Eat more foods that contain soluble fiber. These include  apples, broccoli, carrots, beans, peas, and barley. Try to get 20-30 g of fiber per day.  Eat more home-cooked food. Eat less restaurant, buffet, and fast food.  Limit or avoid alcohol.  Limit foods high in starch and sugar.  Avoid fried foods.  Avoid frying your food. Try baking, boiling, grilling, or broiling it instead. You can also reduce fat by: ? Removing the skin from poultry. ? Removing all visible fats from meats. ? Skimming the fat off of stews, soups, and gravies before serving them. ? Steaming vegetables in water or broth.  Lose weight if you are overweight.  Eat 4-5 servings of nuts, legumes, and seeds per week: ? One serving of dried beans or legumes equals  cup after being cooked. ? One serving of nuts equals 1 ounces. ? One serving of seeds equals  ounce or  one tablespoon.  You may need to keep track of how much salt or sodium you eat. This is especially true if you have high blood pressure. Talk with your doctor or dietitian to get more information. What foods can I eat? Grains Breads, including [place], white, pita, wheat, raisin, rye, oatmeal, and [place]. Tortillas that are neither fried nor made with lard or trans fat. Low-fat rolls, including hotdog and hamburger buns and English muffins. Biscuits. Muffins. Waffles. Pancakes. Light popcorn. Whole-grain cereals. Flatbread. Melba toast. Pretzels. Breadsticks. Rusks. Low-fat snacks. Low-fat crackers, including oyster, saltine, matzo, graham, animal, and rye. Rice and pasta, including brown rice and pastas that are made with whole wheat. Vegetables All vegetables. Fruits All fruits, but limit coconut. Meats and Other Protein Sources Lean, well-trimmed beef, veal, pork, and lamb. Chicken and [place] without skin. All fish and shellfish. Wild duck, rabbit, pheasant, and venison. Egg whites or low-cholesterol egg substitutes. Dried beans, peas, lentils, and tofu. Seeds and most nuts. Dairy Low-fat or nonfat cheeses,  including ricotta, string, and mozzarella. Skim or 1% milk that is liquid, powdered, or evaporated. Buttermilk that is made with low-fat milk. Nonfat or low-fat yogurt. Beverages Mineral water. Diet carbonated beverages. Sweets and Desserts Sherbets and fruit ices. Honey, jam, marmalade, jelly, and syrups. Meringues and gelatins. Pure sugar candy, such as hard candy, jelly beans, gumdrops, mints, marshmallows, and small amounts of dark chocolate. [employer] Eat all sweets and desserts in moderation. Fats and Oils Nonhydrogenated (trans-free) margarines. Vegetable oils, including soybean, sesame, sunflower, olive, peanut, safflower, corn, canola, and cottonseed. Salad dressings or mayonnaise made with a vegetable oil. Limit added fats and oils that you use for cooking, baking, salads, and as spreads. Other Cocoa powder. Coffee and tea. All seasonings and condiments. The items listed above may not be a complete list of recommended foods or beverages. Contact your dietitian for more options. What foods are not recommended? Grains Breads that are made with saturated or trans fats, oils, or whole milk. Croissants. Butter rolls. Cheese breads. Sweet rolls. Donuts. Buttered popcorn. Chow mein noodles. High-fat crackers, such as cheese or butter crackers. Meats and Other Protein Sources Fatty meats, such as hotdogs, short ribs, sausage, spareribs, bacon, rib eye roast or steak, and mutton. High-fat deli meats, such as salami and bologna. Caviar. Domestic duck and goose. Organ meats, such as kidney, liver, sweetbreads, and heart. Dairy Cream, sour cream, cream cheese, and creamed cottage cheese. Whole-milk cheeses, including blue (bleu), Monte[place] Jack, [place], [place], American, [place], Swiss, cheddar, [place], and [place]. Whole or 2% milk that is liquid, evaporated, or condensed. Whole buttermilk. Cream sauce or high-fat cheese sauce. Yogurt that is made from whole milk. Beverages Regular sodas and  juice drinks with added sugar. Sweets and Desserts Frosting. Pudding. Cookies. Cakes other than angel food cake. Candy that has milk chocolate or white chocolate, hydrogenated fat, butter, coconut, or unknown ingredients. Buttered syrups. Full-fat ice cream or ice cream drinks. Fats and Oils Gravy that has suet, meat fat, or shortening. Cocoa butter, hydrogenated oils, palm oil, coconut oil, palm kernel oil. These can often be found in baked products, candy, fried foods, nondairy creamers, and whipped toppings. Solid fats and shortenings, including bacon fat, salt pork, lard, and butter. Nondairy cream substitutes, such as coffee creamers and sour cream substitutes. Salad dressings that are made of unknown oils, cheese, or sour cream. The items listed above may not be a complete list of foods and beverages to avoid. Contact your dietitian for more information.  This information is not intended to replace advice given to you by your health care provider. Make sure you discuss any questions you have with your health care provider. Document Released: 07/13/2011 Document Revised: 06/19/2015 Document Reviewed: 07/05/2013 Elsevier Interactive Patient Education  [employer].

## 2017-08-22 MED FILL — Nitroglycerin IV Soln 100 MCG/ML in D5W: INTRA_ARTERIAL | Qty: 10 | Status: AC

## 2017-08-23 DIAGNOSIS — I10 Essential (primary) hypertension: Secondary | ICD-10-CM | POA: Diagnosis not present

## 2017-08-24 DIAGNOSIS — Z9861 Coronary angioplasty status: Secondary | ICD-10-CM | POA: Diagnosis not present

## 2017-08-24 DIAGNOSIS — I2510 Atherosclerotic heart disease of native coronary artery without angina pectoris: Secondary | ICD-10-CM | POA: Diagnosis not present

## 2017-08-24 DIAGNOSIS — I25810 Atherosclerosis of coronary artery bypass graft(s) without angina pectoris: Secondary | ICD-10-CM | POA: Diagnosis not present

## 2017-08-24 DIAGNOSIS — I5043 Acute on chronic combined systolic (congestive) and diastolic (congestive) heart failure: Secondary | ICD-10-CM | POA: Diagnosis not present

## 2017-08-31 DIAGNOSIS — I10 Essential (primary) hypertension: Secondary | ICD-10-CM | POA: Diagnosis not present

## 2017-08-31 DIAGNOSIS — R0602 Shortness of breath: Secondary | ICD-10-CM | POA: Diagnosis not present

## 2017-09-01 ENCOUNTER — Other Ambulatory Visit (HOSPITAL_COMMUNITY): Payer: Medicare Other

## 2017-09-01 ENCOUNTER — Emergency Department (HOSPITAL_COMMUNITY): Payer: Medicare Other

## 2017-09-01 ENCOUNTER — Inpatient Hospital Stay (HOSPITAL_COMMUNITY)
Admission: EM | Admit: 2017-09-01 | Discharge: 2017-09-03 | DRG: 280 | Disposition: A | Discharge status: Home / Self Care | Payer: Medicare Other | Attending: Cardiology | Admitting: Cardiology

## 2017-09-01 ENCOUNTER — Inpatient Hospital Stay (HOSPITAL_COMMUNITY): Payer: Medicare Other

## 2017-09-01 ENCOUNTER — Encounter (HOSPITAL_COMMUNITY): Payer: Self-pay

## 2017-09-01 ENCOUNTER — Other Ambulatory Visit: Payer: Self-pay

## 2017-09-01 DIAGNOSIS — Z8349 Family history of other endocrine, nutritional and metabolic diseases: Secondary | ICD-10-CM | POA: Diagnosis not present

## 2017-09-01 DIAGNOSIS — N183 Chronic kidney disease, stage 3 unspecified: Secondary | ICD-10-CM | POA: Diagnosis present

## 2017-09-01 DIAGNOSIS — N179 Acute kidney failure, unspecified: Secondary | ICD-10-CM | POA: Diagnosis present

## 2017-09-01 DIAGNOSIS — I5043 Acute on chronic combined systolic (congestive) and diastolic (congestive) heart failure: Secondary | ICD-10-CM | POA: Diagnosis not present

## 2017-09-01 DIAGNOSIS — Z7982 Long term (current) use of aspirin: Secondary | ICD-10-CM

## 2017-09-01 DIAGNOSIS — G4733 Obstructive sleep apnea (adult) (pediatric): Secondary | ICD-10-CM | POA: Diagnosis present

## 2017-09-01 DIAGNOSIS — Z961 Presence of intraocular lens: Secondary | ICD-10-CM | POA: Diagnosis present

## 2017-09-01 DIAGNOSIS — J9601 Acute respiratory failure with hypoxia: Secondary | ICD-10-CM | POA: Diagnosis not present

## 2017-09-01 DIAGNOSIS — I252 Old myocardial infarction: Secondary | ICD-10-CM | POA: Diagnosis not present

## 2017-09-01 DIAGNOSIS — Z9842 Cataract extraction status, left eye: Secondary | ICD-10-CM

## 2017-09-01 DIAGNOSIS — I130 Hypertensive heart and chronic kidney disease with heart failure and stage 1 through stage 4 chronic kidney disease, or unspecified chronic kidney disease: Secondary | ICD-10-CM | POA: Diagnosis not present

## 2017-09-01 DIAGNOSIS — Z951 Presence of aortocoronary bypass graft: Secondary | ICD-10-CM | POA: Diagnosis not present

## 2017-09-01 DIAGNOSIS — I214 Non-ST elevation (NSTEMI) myocardial infarction: Secondary | ICD-10-CM | POA: Diagnosis not present

## 2017-09-01 DIAGNOSIS — Z888 Allergy status to other drugs, medicaments and biological substances status: Secondary | ICD-10-CM

## 2017-09-01 DIAGNOSIS — R739 Hyperglycemia, unspecified: Secondary | ICD-10-CM

## 2017-09-01 DIAGNOSIS — Z95810 Presence of automatic (implantable) cardiac defibrillator: Secondary | ICD-10-CM

## 2017-09-01 DIAGNOSIS — I501 Left ventricular failure: Secondary | ICD-10-CM

## 2017-09-01 DIAGNOSIS — R0602 Shortness of breath: Secondary | ICD-10-CM | POA: Diagnosis not present

## 2017-09-01 DIAGNOSIS — Z833 Family history of diabetes mellitus: Secondary | ICD-10-CM | POA: Diagnosis not present

## 2017-09-01 DIAGNOSIS — J811 Chronic pulmonary edema: Secondary | ICD-10-CM | POA: Diagnosis not present

## 2017-09-01 DIAGNOSIS — I10 Essential (primary) hypertension: Secondary | ICD-10-CM | POA: Diagnosis not present

## 2017-09-01 DIAGNOSIS — Z8249 Family history of ischemic heart disease and other diseases of the circulatory system: Secondary | ICD-10-CM

## 2017-09-01 DIAGNOSIS — I255 Ischemic cardiomyopathy: Secondary | ICD-10-CM | POA: Diagnosis not present

## 2017-09-01 DIAGNOSIS — Z87891 Personal history of nicotine dependence: Secondary | ICD-10-CM

## 2017-09-01 DIAGNOSIS — Z8049 Family history of malignant neoplasm of other genital organs: Secondary | ICD-10-CM | POA: Diagnosis not present

## 2017-09-01 DIAGNOSIS — J810 Acute pulmonary edema: Secondary | ICD-10-CM | POA: Diagnosis not present

## 2017-09-01 DIAGNOSIS — Z801 Family history of malignant neoplasm of trachea, bronchus and lung: Secondary | ICD-10-CM | POA: Diagnosis not present

## 2017-09-01 DIAGNOSIS — E1165 Type 2 diabetes mellitus with hyperglycemia: Secondary | ICD-10-CM | POA: Diagnosis present

## 2017-09-01 DIAGNOSIS — Z955 Presence of coronary angioplasty implant and graft: Secondary | ICD-10-CM | POA: Diagnosis not present

## 2017-09-01 DIAGNOSIS — E1169 Type 2 diabetes mellitus with other specified complication: Secondary | ICD-10-CM

## 2017-09-01 DIAGNOSIS — Z794 Long term (current) use of insulin: Secondary | ICD-10-CM

## 2017-09-01 DIAGNOSIS — I5023 Acute on chronic systolic (congestive) heart failure: Secondary | ICD-10-CM | POA: Diagnosis not present

## 2017-09-01 DIAGNOSIS — E785 Hyperlipidemia, unspecified: Secondary | ICD-10-CM | POA: Diagnosis not present

## 2017-09-01 DIAGNOSIS — Z7902 Long term (current) use of antithrombotics/antiplatelets: Secondary | ICD-10-CM

## 2017-09-01 DIAGNOSIS — I509 Heart failure, unspecified: Secondary | ICD-10-CM

## 2017-09-01 DIAGNOSIS — I5033 Acute on chronic diastolic (congestive) heart failure: Secondary | ICD-10-CM | POA: Diagnosis not present

## 2017-09-01 DIAGNOSIS — E1122 Type 2 diabetes mellitus with diabetic chronic kidney disease: Secondary | ICD-10-CM | POA: Diagnosis present

## 2017-09-01 DIAGNOSIS — E119 Type 2 diabetes mellitus without complications: Secondary | ICD-10-CM

## 2017-09-01 DIAGNOSIS — I2510 Atherosclerotic heart disease of native coronary artery without angina pectoris: Secondary | ICD-10-CM | POA: Diagnosis present

## 2017-09-01 DIAGNOSIS — I25810 Atherosclerosis of coronary artery bypass graft(s) without angina pectoris: Secondary | ICD-10-CM | POA: Diagnosis present

## 2017-09-01 DIAGNOSIS — I2699 Other pulmonary embolism without acute cor pulmonale: Secondary | ICD-10-CM | POA: Diagnosis not present

## 2017-09-01 LAB — COMPREHENSIVE METABOLIC PANEL
ALT: 27 U/L (ref 0–44)
AST: 19 U/L (ref 15–41)
Albumin: 2.9 g/dL — ABNORMAL LOW (ref 3.5–5.0)
Alkaline Phosphatase: 103 U/L (ref 38–126)
Anion gap: 12 (ref 5–15)
BUN: 21 mg/dL (ref 8–23)
CO2: 25 mmol/L (ref 22–32)
Calcium: 9 mg/dL (ref 8.9–10.3)
Chloride: 96 mmol/L — ABNORMAL LOW (ref 98–111)
Creatinine, Ser: 1.41 mg/dL — ABNORMAL HIGH (ref 0.61–1.24)
GFR calc Af Amer: >60 mL/min (ref >60)
GFR calc non Af Amer: 52 mL/min — ABNORMAL LOW (ref >60)
Glucose, Bld: 410 mg/dL — ABNORMAL HIGH (ref 70–99)
Potassium: 3.6 mmol/L (ref 3.5–5.1)
Sodium: 133 mmol/L — ABNORMAL LOW (ref 135–145)
Total Bilirubin: 1.3 mg/dL — ABNORMAL HIGH (ref 0.3–1.2)
Total Protein: 7.2 g/dL (ref 6.5–8.1)

## 2017-09-01 LAB — I-STAT ARTERIAL BLOOD GAS, ED
Acid-Base Excess: 3 mmol/L — ABNORMAL HIGH (ref 0.0–2.0)
Bicarbonate: 28.1 mmol/L — ABNORMAL HIGH (ref 20.0–28.0)
O2 Saturation: 100 %
Patient temperature: 98
TCO2: 29 mmol/L (ref 22–32)
pCO2 arterial: 43.7 mmHg (ref 32.0–48.0)
pH, Arterial: 7.414 (ref 7.350–7.450)
pO2, Arterial: 208 mmHg — ABNORMAL HIGH (ref 83.0–108.0)

## 2017-09-01 LAB — I-STAT TROPONIN, ED: Troponin i, poc: 0.11 ng/mL (ref 0.00–0.08)

## 2017-09-01 LAB — CBC WITH DIFFERENTIAL/PLATELET
Abs Immature Granulocytes: 0.2 10*3/uL — ABNORMAL HIGH (ref 0.0–0.1)
Basophils Absolute: 0.1 10*3/uL (ref 0.0–0.1)
Basophils Relative: 1 %
Eosinophils Absolute: 1.3 10*3/uL — ABNORMAL HIGH (ref 0.0–0.7)
Eosinophils Relative: 7 %
HCT: 38.6 % — ABNORMAL LOW (ref 39.0–52.0)
Hemoglobin: 12 g/dL — ABNORMAL LOW (ref 13.0–17.0)
Immature Granulocytes: 1 %
Lymphocytes Relative: 3 %
Lymphs Abs: 0.5 10*3/uL — ABNORMAL LOW (ref 0.7–4.0)
MCH: 28.8 pg (ref 26.0–34.0)
MCHC: 31.1 g/dL (ref 30.0–36.0)
MCV: 92.8 fL (ref 78.0–100.0)
Monocytes Absolute: 1 10*3/uL (ref 0.1–1.0)
Monocytes Relative: 6 %
Neutro Abs: 14.8 10*3/uL — ABNORMAL HIGH (ref 1.7–7.7)
Neutrophils Relative %: 82 %
Platelets: 362 10*3/uL (ref 150–400)
RBC: 4.16 MIL/uL — ABNORMAL LOW (ref 4.22–5.81)
RDW: 13.1 % (ref 11.5–15.5)
WBC: 18 10*3/uL — ABNORMAL HIGH (ref 4.0–10.5)

## 2017-09-01 LAB — ECHOCARDIOGRAM LIMITED
Height: 67 in
Weight: 3150.4 oz

## 2017-09-01 LAB — GLUCOSE, CAPILLARY
Glucose-Capillary: 292 mg/dL — ABNORMAL HIGH (ref 70–99)
Glucose-Capillary: 317 mg/dL — ABNORMAL HIGH (ref 70–99)
Glucose-Capillary: 345 mg/dL — ABNORMAL HIGH (ref 70–99)

## 2017-09-01 LAB — CBG MONITORING, ED: Glucose-Capillary: 305 mg/dL — ABNORMAL HIGH (ref 70–99)

## 2017-09-01 LAB — BRAIN NATRIURETIC PEPTIDE: B Natriuretic Peptide: 1431.2 pg/mL — ABNORMAL HIGH (ref 0.0–100.0)

## 2017-09-01 MED ORDER — ISOSORBIDE MONONITRATE ER 30 MG PO TB24
30.0000 mg | ORAL_TABLET | Freq: Every day | ORAL | Status: DC
  Administered 2017-09-01 – 2017-09-03 (×3): 30 mg via ORAL
  Filled 2017-09-01 (×3): qty 1

## 2017-09-01 MED ORDER — HEPARIN SODIUM (PORCINE) 5000 UNIT/ML IJ SOLN
5000.0000 [IU] | Freq: Three times a day (TID) | INTRAMUSCULAR | Status: DC
  Administered 2017-09-01 – 2017-09-03 (×4): 5000 [IU] via SUBCUTANEOUS
  Filled 2017-09-01 (×4): qty 1

## 2017-09-01 MED ORDER — ONDANSETRON HCL 4 MG/2ML IJ SOLN: 4.0000 mg | Freq: Four times a day (QID) | INTRAMUSCULAR | Status: DC | PRN

## 2017-09-01 MED ORDER — PERFLUTREN LIPID MICROSPHERE
INTRAVENOUS | Status: AC
  Administered 2017-09-01: 3.5 mL via INTRAVENOUS
  Filled 2017-09-01: qty 10

## 2017-09-01 MED ORDER — SODIUM CHLORIDE 0.9% FLUSH: 3.0000 mL | INTRAVENOUS | Status: DC | PRN

## 2017-09-01 MED ORDER — AMIODARONE HCL 200 MG PO TABS
200.0000 mg | ORAL_TABLET | Freq: Every day | ORAL | Status: DC
  Administered 2017-09-01 – 2017-09-03 (×3): 200 mg via ORAL
  Filled 2017-09-01 (×3): qty 1

## 2017-09-01 MED ORDER — INSULIN ASPART 100 UNIT/ML ~~LOC~~ SOLN: 0.0000 [IU] | Freq: Three times a day (TID) | SUBCUTANEOUS | Status: DC

## 2017-09-01 MED ORDER — PERFLUTREN LIPID MICROSPHERE
1.0000 mL | INTRAVENOUS | Status: AC | PRN
  Administered 2017-09-01: 3.5 mL via INTRAVENOUS
  Filled 2017-09-01: qty 10

## 2017-09-01 MED ORDER — ASPIRIN EC 81 MG PO TBEC
81.0000 mg | DELAYED_RELEASE_TABLET | Freq: Every day | ORAL | Status: DC
  Administered 2017-09-01 – 2017-09-03 (×3): 81 mg via ORAL
  Filled 2017-09-01 (×3): qty 1

## 2017-09-01 MED ORDER — TORSEMIDE 20 MG PO TABS
20.0000 mg | ORAL_TABLET | Freq: Two times a day (BID) | ORAL | Status: DC
Start: 2017-09-01 — End: 2017-09-02
  Administered 2017-09-01 – 2017-09-02 (×3): 20 mg via ORAL
  Filled 2017-09-01 (×3): qty 1

## 2017-09-01 MED ORDER — ACETAMINOPHEN 325 MG PO TABS: 650.0000 mg | ORAL_TABLET | ORAL | Status: DC | PRN

## 2017-09-01 MED ORDER — CARVEDILOL 6.25 MG PO TABS
6.2500 mg | ORAL_TABLET | Freq: Two times a day (BID) | ORAL | Status: DC
  Administered 2017-09-01 – 2017-09-03 (×5): 6.25 mg via ORAL
  Filled 2017-09-01 (×5): qty 1

## 2017-09-01 MED ORDER — TICAGRELOR 90 MG PO TABS
90.0000 mg | ORAL_TABLET | Freq: Two times a day (BID) | ORAL | Status: DC
  Administered 2017-09-01 – 2017-09-03 (×5): 90 mg via ORAL
  Filled 2017-09-01 (×5): qty 1

## 2017-09-01 MED ORDER — SERTRALINE HCL 50 MG PO TABS
50.0000 mg | ORAL_TABLET | Freq: Every day | ORAL | Status: DC
  Administered 2017-09-01 – 2017-09-03 (×3): 50 mg via ORAL
  Filled 2017-09-01 (×4): qty 1

## 2017-09-01 MED ORDER — INSULIN ASPART PROT & ASPART (70-30 MIX) 100 UNIT/ML ~~LOC~~ SUSP
10.0000 [IU] | Freq: Two times a day (BID) | SUBCUTANEOUS | Status: DC
Start: 2017-09-01 — End: 2017-09-03
  Administered 2017-09-01: 30 [IU] via SUBCUTANEOUS
  Administered 2017-09-02: 10 [IU] via SUBCUTANEOUS
  Administered 2017-09-03: 20 [IU] via SUBCUTANEOUS
  Filled 2017-09-01 (×2): qty 10

## 2017-09-01 MED ORDER — HYDRALAZINE HCL 25 MG PO TABS
25.0000 mg | ORAL_TABLET | Freq: Two times a day (BID) | ORAL | Status: DC
  Administered 2017-09-01 – 2017-09-03 (×5): 25 mg via ORAL
  Filled 2017-09-01 (×5): qty 1

## 2017-09-01 MED ORDER — INSULIN ASPART 100 UNIT/ML ~~LOC~~ SOLN
0.0000 [IU] | Freq: Three times a day (TID) | SUBCUTANEOUS | Status: DC
  Administered 2017-09-01: 5 [IU] via SUBCUTANEOUS
  Administered 2017-09-02 – 2017-09-03 (×4): 2 [IU] via SUBCUTANEOUS
  Administered 2017-09-03: 3 [IU] via SUBCUTANEOUS

## 2017-09-01 MED ORDER — FUROSEMIDE 10 MG/ML IJ SOLN
40.0000 mg | Freq: Once | INTRAMUSCULAR | Status: AC
  Administered 2017-09-01: 40 mg via INTRAVENOUS
  Filled 2017-09-01: qty 4

## 2017-09-01 MED ORDER — INSULIN ASPART 100 UNIT/ML ~~LOC~~ SOLN
8.0000 [IU] | Freq: Once | SUBCUTANEOUS | Status: AC
  Administered 2017-09-01: 8 [IU] via SUBCUTANEOUS
  Filled 2017-09-01: qty 1

## 2017-09-01 MED ORDER — INSULIN ASPART PROT & ASPART (70-30 MIX) 100 UNIT/ML ~~LOC~~ SUSP
30.0000 [IU] | Freq: Two times a day (BID) | SUBCUTANEOUS | Status: DC
  Filled 2017-09-01: qty 10

## 2017-09-01 MED ORDER — SODIUM CHLORIDE 0.9 % IV SOLN: 250.0000 mL | INTRAVENOUS | Status: DC | PRN

## 2017-09-01 MED ORDER — PRAVASTATIN SODIUM 40 MG PO TABS
80.0000 mg | ORAL_TABLET | Freq: Every day | ORAL | Status: DC
  Administered 2017-09-01 – 2017-09-02 (×2): 80 mg via ORAL
  Filled 2017-09-01 (×2): qty 2

## 2017-09-01 MED ORDER — SODIUM CHLORIDE 0.9% FLUSH
3.0000 mL | Freq: Two times a day (BID) | INTRAVENOUS | Status: DC
  Administered 2017-09-01 – 2017-09-03 (×5): 3 mL via INTRAVENOUS

## 2017-09-01 NOTE — ED Provider Notes (Addendum)
[facility] EMERGENCY DEPARTMENT Provider Note   CSN: [id number] Arrival date & time: 09/01/17  0157     History   Chief Complaint Chief Complaint  Patient presents with  . Shortness of Breath    HPI Martin Bush is a 62 y.o. male.  Patient presents to the ER from home by ambulance for shortness of breath.  Patient reports that he had been feeling increasing shortness of breath over the course of the day yesterday.  He uses CPAP at night.  He placed his CPAP on and went to bed but progressively worsened with his breathing.  He did take his nighttime dose of Lasix tonight, has been placed on oxygen by EMS and is feeling improvement with the addition of oxygen.  He does not use oxygen at home.  He is not experiencing chest pain.  Reports that he weighs himself daily and he is generally within 1 pound of his normal weight.  He has not noticed any significant increased swelling.     Past Medical History:  Diagnosis Date  . AICD (automatic cardioverter/defibrillator) present 2016  . Arthritis    "thumbs"  (08/02/2017)  . CHF (congestive heart failure) ([place])   . Colon polyps   . Coronary artery disease   . Depression   . High cholesterol   . Hypertension   . MI (myocardial infarction) ([place]) 2003  . OSA on CPAP   . Pneumonia 10/2014  . Proteinuria   . Type II diabetes mellitus ([facility])    insulin dependent    Patient Active Problem List   Diagnosis Date Noted  . Pulmonary edema 08/20/2017  . Ventricular tachycardia ([place])   . ICD (implantable cardioverter-defibrillator) discharge 08/02/2017  . Obesity 06/17/2017  . CHF (congestive heart failure) ([place]) 07/23/2015  . Hyperlipidemia 07/11/2015  . Essential hypertension 07/11/2015  . CAD (coronary artery disease) 07/11/2015  . Depression 07/11/2015  . CKD (chronic kidney disease), stage III ([place]) 07/11/2015  . OSA on CPAP 07/11/2015  . Lightheadedness 07/11/2015  . Carotid artery stenosis   . S/P eye surgery  05/15/2015  . Injury of left toe 04/27/2015  . Chronic low back pain 02/13/2015  . Diabetes ([place]) 02/13/2015  . Dizziness 02/05/2015  . NSTEMI (non-ST elevated myocardial infarction) ([place]) 07/16/2014    Past Surgical History:  Procedure Laterality Date  . CARDIAC CATHETERIZATION N/A 07/16/2014   Procedure: Left Heart Cath and Coronary Angiography;  Surgeon: Adrian Prows, MD;  Location: [facility];  Service: Cardiovascular;  Laterality: N/A;  . CARDIAC CATHETERIZATION  07/16/2014   Procedure: Coronary Balloon Angioplasty;  Surgeon: Adrian Prows, MD;  Location: [facility];  Service: Cardiovascular;;  . CARDIAC CATHETERIZATION  2003  . CARDIAC DEFIBRILLATOR PLACEMENT  2016  . CATARACT EXTRACTION W/ INTRAOCULAR LENS IMPLANT Left 03/2014  . CORONARY ANGIOPLASTY WITH STENT PLACEMENT     "I've got a total of 8 stents in there; mostly doine at [facility]" (08/02/2017)  . CORONARY ARTERY BYPASS GRAFT  ~ 2003   "CABG X2"; [facility]  . CORONARY ATHERECTOMY N/A 08/16/2017   Procedure: CORONARY ATHERECTOMY;  Surgeon: Nigel Mormon, MD;  Location: [facility];  Service: Cardiovascular;  Laterality: N/A;  . CORONARY BALLOON ANGIOPLASTY N/A 08/04/2017   Procedure: CORONARY BALLOON ANGIOPLASTY;  Surgeon: Adrian Prows, MD;  Location: [facility];  Service: Cardiovascular;  Laterality: N/A;  . CORONARY STENT INTERVENTION N/A 08/16/2017   Procedure: CORONARY STENT INTERVENTION;  Surgeon: Nigel Mormon,  MD;  Location: [facility];  Service: Cardiovascular;  Laterality: N/A;  . ELBOW SURGERY Left ?2001   "pinched nerve"  . LEFT HEART CATH AND CORS/GRAFTS ANGIOGRAPHY N/A 08/04/2017   Procedure: LEFT HEART CATH AND CORS/GRAFTS ANGIOGRAPHY;  Surgeon: Adrian Prows, MD;  Location: [facility];  Service: Cardiovascular;  Laterality: N/A;  . LEFT HEART CATH AND CORS/GRAFTS ANGIOGRAPHY N/A 08/20/2017   Procedure: LEFT HEART CATH AND CORS/GRAFTS ANGIOGRAPHY;  Surgeon:  Nigel Mormon, MD;  Location: [facility];  Service: Cardiovascular;  Laterality: N/A;        Home Medications    Prior to Admission medications   Medication Sig Start Date End Date Taking? Authorizing Provider  amiodarone (PACERONE) 200 MG tablet Take 200 mg by mouth daily.  11/19/14  Yes [provider]  aspirin EC 81 MG tablet Take 81 mg by mouth daily.   Yes [provider]  carvedilol (COREG) 12.5 MG tablet Take 6.25 mg by mouth 2 (two) times daily with a meal.   Yes [provider]  Cholecalciferol (VITAMIN D) 2000 units tablet Take 2,000 Units by mouth 2 (two) times daily.   Yes [provider]  empagliflozin (JARDIANCE) 25 MG TABS tablet Take 25 mg by mouth daily.   Yes [provider]  hydrALAZINE (APRESOLINE) 25 MG tablet Take 25 mg by mouth 2 (two) times daily.   Yes [provider]  hydrocortisone 2.5 % cream Apply topically 2 (two) times daily. Right back for itching Patient taking differently: Apply 1 application topically 2 (two) times daily as needed (itching). Right back for itching 05/27/17  Yes McLean-Scocuzza, Nino Glow, MD  insulin aspart protamine- aspart (NOVOLOG MIX 70/30) (70-30) 100 UNIT/ML injection Inject 30 Units into the skin 2 (two) times daily with a meal.    Yes [provider]  isosorbide mononitrate (IMDUR) 30 MG 24 hr tablet Take 30 mg by mouth daily.   Yes [provider]  Liraglutide (VICTOZA [place]) Inject 1.8 Units into the skin daily.   Yes [provider]  metFORMIN (GLUCOPHAGE) 1000 MG tablet Take 1 tablet (1,000 mg total) by mouth 2 (two) times daily with a meal. 08/06/17  Yes Adrian Prows, MD  Omega-3 Fatty Acids (FISH OIL) 1000 MG CAPS Take 2,000 mg by mouth 3 (three) times daily.   Yes [provider]  pravastatin (PRAVACHOL) 80 MG tablet Take 80 mg by mouth at bedtime.    Yes [provider]  sertraline (ZOLOFT) 100 MG tablet Take 50 mg by mouth  daily.    Yes [provider]  ticagrelor (BRILINTA) 90 MG TABS tablet Take 1 tablet (90 mg total) by mouth 2 (two) times daily. 08/05/17  Yes Adrian Prows, MD  mupirocin ointment (BACTROBAN) 2 % Apply 1 application topically 2 (two) times daily. To right back Patient not taking: Reported on 08/02/2017 05/27/17   McLean-Scocuzza, Nino Glow, MD  PRESCRIPTION MEDICATION Inhale into the lungs at bedtime. CPAP    [provider]    Family History Family History  Problem Relation Age of Onset  . Uterine cancer Mother   . Lung cancer Mother   . Hyperlipidemia Father   . Heart disease Father   . Hypertension Father   . Diabetes Father     Social History Social History   Tobacco Use  . Smoking status: Never Smoker  . Smokeless tobacco: Former Network engineer Use Topics  . Alcohol use: Yes  Comment: 08/02/2017 "maybe 1 drink/year"  . Drug use: Never     Allergies   Diltiazem   Review of Systems Review of Systems  Respiratory: Positive for shortness of breath.   All other systems reviewed and are negative.    Physical Exam Updated Vital Signs BP (!) 142/92   Pulse 92   Resp 20   Ht 5\' 7"  (1.702 m)   Wt 97.5 kg   SpO2 99%   BMI 33.67 kg/m   Physical Exam  Constitutional: He is oriented to person, place, and time. He appears well-developed and well-nourished. No distress.  HENT:  Head: Normocephalic and atraumatic.  Right Ear: Hearing normal.  Left Ear: Hearing normal.  Nose: Nose normal.  Mouth/Throat: Oropharynx is clear and moist and mucous membranes are normal.  Eyes: Pupils are equal, round, and reactive to light. Conjunctivae and EOM are normal.  Neck: Normal range of motion. Neck supple.  Cardiovascular: Regular rhythm, S1 normal and S2 normal. Exam reveals no gallop and no friction rub.  No murmur heard. Pulmonary/Chest: Effort normal and breath sounds normal. No respiratory distress. He exhibits no tenderness.  Abdominal: Soft. Normal appearance  and bowel sounds are normal. There is no hepatosplenomegaly. There is no tenderness. There is no rebound, no guarding, no tenderness at McBurney's point and negative Murphy's sign. No hernia.  Musculoskeletal: Normal range of motion.  Neurological: He is alert and oriented to person, place, and time. He has normal strength. No cranial nerve deficit or sensory deficit. Coordination normal. GCS eye subscore is 4. GCS verbal subscore is 5. GCS motor subscore is 6.  Skin: Skin is warm, dry and intact. No rash noted. No cyanosis.  Psychiatric: He has a normal mood and affect. His speech is normal and behavior is normal. Thought content normal.  Nursing note and vitals reviewed.    ED Treatments / Results  Labs (all labs ordered are listed, but only abnormal results are displayed) Labs Reviewed  CBC WITH DIFFERENTIAL/PLATELET - Abnormal; Notable for the following components:      Result Value   WBC 18.0 (*)    RBC 4.16 (*)    Hemoglobin 12.0 (*)    HCT 38.6 (*)    Neutro Abs 14.8 (*)    Lymphs Abs 0.5 (*)    Eosinophils Absolute 1.3 (*)    Abs Immature Granulocytes 0.2 (*)    All other components within normal limits  COMPREHENSIVE METABOLIC PANEL - Abnormal; Notable for the following components:   Sodium 133 (*)    Chloride 96 (*)    Glucose, Bld 410 (*)    Creatinine, Ser 1.41 (*)    Albumin 2.9 (*)    Total Bilirubin 1.3 (*)    GFR calc non Af Amer 52 (*)    All other components within normal limits  BRAIN NATRIURETIC PEPTIDE - Abnormal; Notable for the following components:   B Natriuretic Peptide 1,431.2 (*)    All other components within normal limits  I-STAT TROPONIN, ED - Abnormal; Notable for the following components:   Troponin i, poc 0.11 (*)    All other components within normal limits  I-STAT ARTERIAL BLOOD GAS, ED - Abnormal; Notable for the following components:   pO2, Arterial 208.0 (*)    Bicarbonate 28.1 (*)    Acid-Base Excess 3.0 (*)    All other components  within normal limits  CBG MONITORING, ED    EKG EKG Interpretation  Date/Time:  Thursday September 01 2017 05:40:39 EDT Ventricular  Rate:  86 PR Interval:    QRS Duration: 143 QT Interval:  418 QTC Calculation: 500 R Axis:   84 Text Interpretation:  Sinus rhythm Ventricular premature complex LVH with secondary repolarization abnormality Borderline prolonged QT interval Confirmed by Orpah Greek [phone]) on 09/01/2017 5:51:45 AM   Radiology Dg Chest 2 View  Result Date: 09/01/2017 CLINICAL DATA:  Acute onset of shortness of breath. EXAM: CHEST - 2 VIEW COMPARISON:  Chest radiograph performed 08/20/2017 FINDINGS: The lungs are well-aerated. Vascular congestion is noted. Increased interstitial markings raise concern for pulmonary edema. No significant pleural effusion or pneumothorax is seen. The heart is borderline normal in size. The patient is status post median sternotomy. A pacemaker/AICD is noted at the left chest wall, with leads ending at the right atrium, right ventricle and coronary sinus. No acute osseous abnormalities are seen. IMPRESSION: Vascular congestion noted. Increased interstitial markings raise concern for pulmonary edema. Electronically Signed   By: Garald Balding M.D.   On: 09/01/2017 04:06    Procedures Procedures (including critical care time)  Medications Ordered in ED Medications  insulin aspart (novoLOG) injection 8 Units (has no administration in time range)  furosemide (LASIX) injection 40 mg (40 mg Intravenous Given 09/01/17 0510)     Initial Impression / Assessment and Plan / ED Course  I have reviewed the triage vital signs and the nursing notes.  Pertinent labs & imaging results that were available during my care of the patient were reviewed by me and considered in my medical decision making (see chart for details).     She presents to the emergency department for evaluation of shortness of breath.  Patient has a significant cardiac history.  He  was recently hospitalized with a similar presentation.  During that hospitalization he was treated for pulmonary edema.  Patient reports onset of shortness of breath yesterday evening which worsened at night.  He has mild hypoxia here in the ER, requiring supplemental oxygen.  He does not use oxygen at home.  Chest x-ray shows evidence of pulmonary edema.  Patient had taken his nighttime diuretic, has diuresed approximately 1 L here in the ER.  He is still, however, dyspneic and hypoxic, will give additional Lasix and placed on BiPAP.  Discussed with Dr. Virgina Jock, cardiology.  He will see the patient in the ER for admission.  CRITICAL CARE Performed by: Orpah Greek   Total critical care time: 30 minutes  Critical care time was exclusive of separately billable procedures and treating other patients.  Critical care was necessary to treat or prevent imminent or life-threatening deterioration.  Critical care was time spent personally by me on the following activities: development of treatment plan with patient and/or surrogate as well as nursing, discussions with consultants, evaluation of patient's response to treatment, examination of patient, obtaining history from patient or surrogate, ordering and performing treatments and interventions, ordering and review of laboratory studies, ordering and review of radiographic studies, pulse oximetry and re-evaluation of patient's condition.   Final Clinical Impressions(s) / ED Diagnoses   Final diagnoses:  Pulmonary edema cardiac cause ([place])  Acute respiratory failure with hypoxia [facility])  Hyperglycemia    ED Discharge Orders    None       Orpah Greek, MD 09/01/17 [id number]    Orpah Greek, MD 09/01/17 [phone]

## 2017-09-01 NOTE — Progress Notes (Signed)
Admission completed with pt and pt wife at bedside

## 2017-09-01 NOTE — Progress Notes (Addendum)
RN called vascular per MD order. Vascular states pt will be seen in the morning and that pt needs to be NPO after 2300 tonight. MD notified.

## 2017-09-01 NOTE — Progress Notes (Signed)
  Echocardiogram 2D Echocardiogram has been performed.  Martin Bush 09/01/2017, 6:02 PM

## 2017-09-01 NOTE — Progress Notes (Signed)
Pt has scheduled Novolog 70/30 for 1700. Instructions state that pt will tell us how many units he needs. Pt's CBG= 292 and pt states he needs 40 units for this. Instructions state between 10-30 units. Pt also states that he tends to "bottom out" in terms of his glucose level. RN paged MD for further instructions.

## 2017-09-01 NOTE — H&P (Signed)
Martin Bush is an 63 y.o. male.   Chief Complaint: Shortness of breath HPI:   62 year old Caucasian male with hypertension, type 2 diabetes mellitus, hyperlipidemia, ischemic cardiomyopathy EF 20-25% status post ICD placement, coronary artery disease status post CABG 2 and multiple prior PCI's, recent atherectomy and complex PCI with overlapping stents in left main extending into left PDA with synergy 3.0 x 20 and 3.5 x 20 mm drug-eluting stents (08/16/2017). Patient was admitted with pulmonary edema and NSTEMI on 08/20/2017. He underwent emergent cath that showed patent stents, now new changes, not in cardiogenic shock. He was started on diuretics and discharged home. Patient was recently seen in the office on 07/331/2019 and was switched from furosemide to torsemide 20 mg bid.  Patient presented to the ED early morning on 09/01/2017 with acute shortness of breath and worsening leg edema over the last few days. Chest xray showed congestion. BNP elevated to 1400. Cr had improved to 1.16 on 07/30, now increased to 1.41.   Past Medical History:  Diagnosis Date  . AICD (automatic cardioverter/defibrillator) present 2016  . Arthritis    "thumbs"  (08/02/2017)  . CHF (congestive heart failure) ([facility])   . Colon polyps   . Coronary artery disease   . Depression   . High cholesterol   . Hypertension   . MI (myocardial infarction) ([facility]) 2003  . OSA on CPAP   . Pneumonia 10/2014  . Proteinuria   . Type II diabetes mellitus ([facility])    insulin dependent    Past Surgical History:  Procedure Laterality Date  . CARDIAC CATHETERIZATION N/A 07/16/2014   Procedure: Left Heart Cath and Coronary Angiography;  Surgeon: Adrian Prows, MD;  Location: [facility];  Service: Cardiovascular;  Laterality: N/A;  . CARDIAC CATHETERIZATION  07/16/2014   Procedure: Coronary Balloon Angioplasty;  Surgeon: Adrian Prows, MD;  Location: [facility] [facility];  Service: Cardiovascular;;  . CARDIAC CATHETERIZATION  2003  .  CARDIAC DEFIBRILLATOR PLACEMENT  2016  . CATARACT EXTRACTION W/ INTRAOCULAR LENS IMPLANT Left 03/2014  . CORONARY ANGIOPLASTY WITH STENT PLACEMENT     "I've got a total of 8 stents in there; mostly doine at [facility]" (08/02/2017)  . CORONARY ARTERY BYPASS GRAFT  ~ 2003   "CABG X2"; [facility]  . CORONARY ATHERECTOMY N/A 08/16/2017   Procedure: CORONARY ATHERECTOMY;  Surgeon: Nigel Mormon, MD;  Location: [place] [facility];  Service: Cardiovascular;  Laterality: N/A;  . CORONARY BALLOON ANGIOPLASTY N/A 08/04/2017   Procedure: CORONARY BALLOON ANGIOPLASTY;  Surgeon: Adrian Prows, MD;  Location: [place] [facility];  Service: Cardiovascular;  Laterality: N/A;  . CORONARY STENT INTERVENTION N/A 08/16/2017   Procedure: CORONARY STENT INTERVENTION;  Surgeon: Nigel Mormon, MD;  Location: [place] [facility];  Service: Cardiovascular;  Laterality: N/A;  . ELBOW SURGERY Left ?2001   "pinched nerve"  . LEFT HEART CATH AND CORS/GRAFTS ANGIOGRAPHY N/A 08/04/2017   Procedure: LEFT HEART CATH AND CORS/GRAFTS ANGIOGRAPHY;  Surgeon: Adrian Prows, MD;  Location: [place] [facility];  Service: Cardiovascular;  Laterality: N/A;  . LEFT HEART CATH AND CORS/GRAFTS ANGIOGRAPHY N/A 08/20/2017   Procedure: LEFT HEART CATH AND CORS/GRAFTS ANGIOGRAPHY;  Surgeon: Nigel Mormon, MD;  Location: [place] [facility];  Service: Cardiovascular;  Laterality: N/A;    Family History  Problem Relation Age of Onset  . Uterine cancer Mother   . Lung cancer Mother   . Hyperlipidemia Father   . Heart disease Father   .  Hypertension Father   . Diabetes Father    Social History:  reports that he has never smoked. He quit smokeless tobacco use about 17 years ago. He reports that he drinks alcohol. He reports that he does not use drugs.  Allergies:  Allergies  Allergen Reactions  . Diltiazem Rash     (Not in a hospital admission)  Results for orders placed or performed during the hospital encounter of  09/01/17 (from the past 48 hour(s))  CBC with Differential/Platelet     Status: Abnormal   Collection Time: 09/01/17  3:10 AM  Result Value Ref Range   WBC 18.0 (H) 4.0 - 10.5 K/uL   RBC 4.16 (L) 4.22 - 5.81 MIL/uL   Hemoglobin 12.0 (L) 13.0 - 17.0 g/dL   HCT 38.6 (L) 39.0 - 52.0 %   MCV 92.8 78.0 - 100.0 fL   MCH 28.8 26.0 - 34.0 pg   MCHC 31.1 30.0 - 36.0 g/dL   RDW 13.1 11.5 - 15.5 %   Platelets 362 150 - 400 K/uL   Neutrophils Relative % 82 %   Neutro Abs 14.8 (H) 1.7 - 7.7 K/uL   Lymphocytes Relative 3 %   Lymphs Abs 0.5 (L) 0.7 - 4.0 K/uL   Monocytes Relative 6 %   Monocytes Absolute 1.0 0.1 - 1.0 K/uL   Eosinophils Relative 7 %   Eosinophils Absolute 1.3 (H) 0.0 - 0.7 K/uL   Basophils Relative 1 %   Basophils Absolute 0.1 0.0 - 0.1 K/uL   Immature Granulocytes 1 %   Abs Immature Granulocytes 0.2 (H) 0.0 - 0.1 K/uL    Comment: Performed at [facility], [address], [place], [place] [address]  Comprehensive metabolic panel     Status: Abnormal   Collection Time: 09/01/17  3:10 AM  Result Value Ref Range   Sodium 133 (L) 135 - 145 mmol/L   Potassium 3.6 3.5 - 5.1 mmol/L   Chloride 96 (L) 98 - 111 mmol/L   CO2 25 22 - 32 mmol/L   Glucose, Bld 410 (H) 70 - 99 mg/dL   BUN 21 8 - 23 mg/dL   Creatinine, Ser 1.41 (H) 0.61 - 1.24 mg/dL   Calcium 9.0 8.9 - 10.3 mg/dL   Total Protein 7.2 6.5 - 8.1 g/dL   Albumin 2.9 (L) 3.5 - 5.0 g/dL   AST 19 15 - 41 U/L   ALT 27 0 - 44 U/L   Alkaline Phosphatase 103 38 - 126 U/L   Total Bilirubin 1.3 (H) 0.3 - 1.2 mg/dL   GFR calc non Af Amer 52 (L) >60 mL/min   GFR calc Af Amer >60 >60 mL/min    Comment: (NOTE) The eGFR has been calculated using the CKD EPI equation. This calculation has not been validated in all clinical situations. eGFR's persistently <60 mL/min signify possible Chronic Kidney Disease.    Anion gap 12 5 - 15    Comment: Performed at [facility] [address] [place], [place] [address]  Brain  natriuretic peptide     Status: Abnormal   Collection Time: 09/01/17  3:10 AM  Result Value Ref Range   B Natriuretic Peptide 1,431.2 (H) 0.0 - 100.0 pg/mL    Comment: Performed at [facility] [address] [place], [place] [address]  I-stat troponin, ED     Status: Abnormal   Collection Time: 09/01/17  3:27 AM  Result Value Ref Range   Troponin i, poc 0.11 (  HH) 0.00 - 0.08 ng/mL   Comment NOTIFIED PHYSICIAN    Comment 3            Comment: Due to the release kinetics of cTnI, a negative result within the first hours of the onset of symptoms does not rule out myocardial infarction with certainty. If myocardial infarction is still suspected, repeat the test at appropriate intervals.   I-Stat arterial blood gas, ED     Status: Abnormal   Collection Time: 09/01/17  5:07 AM  Result Value Ref Range   pH, Arterial 7.414 7.350 - 7.450   pCO2 arterial 43.7 32.0 - 48.0 mmHg   pO2, Arterial 208.0 (H) 83.0 - 108.0 mmHg   Bicarbonate 28.1 (H) 20.0 - 28.0 mmol/L   TCO2 29 22 - 32 mmol/L   O2 Saturation 100.0 %   Acid-Base Excess 3.0 (H) 0.0 - 2.0 mmol/L   Patient temperature 98.0 F    Collection site RADIAL, ALLEN'S TEST ACCEPTABLE    Drawn by RT    Sample type ARTERIAL   POC CBG, ED     Status: Abnormal   Collection Time: 09/01/17  5:38 AM  Result Value Ref Range   Glucose-Capillary 305 (H) 70 - 99 mg/dL   Dg Chest 2 View  Result Date: 09/01/2017 CLINICAL DATA:  Acute onset of shortness of breath. EXAM: CHEST - 2 VIEW COMPARISON:  Chest radiograph performed 08/20/2017 FINDINGS: The lungs are well-aerated. Vascular congestion is noted. Increased interstitial markings raise concern for pulmonary edema. No significant pleural effusion or pneumothorax is seen. The heart is borderline normal in size. The patient is status post median sternotomy. A pacemaker/AICD is noted at the left chest wall, with leads ending at the right atrium, right ventricle and coronary sinus. No acute osseous  abnormalities are seen. IMPRESSION: Vascular congestion noted. Increased interstitial markings raise concern for pulmonary edema. Electronically Signed   By: Garald Balding M.D.   On: 09/01/2017 04:06    ROS Constitutional: Negative forchillsand fever.  HENT:Negative.  Respiratory: Positive for shortness of breath.  Cardiovascular: Negative forchest painand palpitations. Positive for leg swelling.  Gastrointestinal:Negative.  Genitourinary:Negative.  Musculoskeletal:Negative.  Neurological: Negative forloss of consciousness.  Endo/Heme/Allergies:Does not bruise/bleed easily.  Psychiatric/Behavioral:Negative. All other systems reviewed and are negative   Blood pressure 130/82, pulse 78, resp. rate 14, height '5\' 7"'  (1.702 m), weight 97.5 kg, SpO2 99 %. Physical Exam  Nursing noteand vitalsreviewed. Constitutional: He isoriented to person, place, and time. He appearswell-developedand well-nourished. He appearsmildly distressed.  HENT:  Head:Normocephalicand atraumatic.  Cardiovascular:S1 normal,S2 normaland intact distal pulses.Tachycardiapresent.  Respiratory:No accessory muscle usagepresent. Mild tachypneanoted. He is in mild respiratory distress. He has no rales. Musculoskeletal: He exhibitsedema(2+).  Neurological: He isalertand oriented to person, place, and time. Nocranial nerve deficit.  Skin: Skin iswarmand dry.  Psychiatric: He has anormal mood and affect.    Assessment/Plan 62 year old Caucasian male with hypertension, type 2 diabetes mellitus, hyperlipidemia, ischemic cardiomyopathy EF 20-25% status post ICD placement, coronary artery disease status post CABG 2 and multiple prior PCI's-most recently atherectomy and complex PCI to left main/left circumflex on 08/16/2017. Now admitted with shortness of breath.moval  Pulmonary edema: Secondary to acute decompensated heart failure Improved after initial diuresis in the ED.  Recommend torsemide 20 mg PO bid. Holding metolazone given mild increase in creatinine. Will obtain renal artery duplex ultrasound to evaluate for renal artery stenosis, given his recurrent pulmonary edema presentation.  Acute on chronic HFrEF: Currently tolerating nasal canula oxygen Strict intake and output. Daily  weights.   CAD: Stable, as described above.  Hypertension: Controlled. Continue baseline medications.   History of VT: Continue amiodarone 200 mg daily.  Type 2 diabetes mellitus: Continue insulin. Hold other agents. Hypoglycemia protocol.   Nigel Mormon, MD 09/01/2017, 10:01 AM  Nigel Mormon, MD [facility] [place] Pager: [phone] Office: [phone] If no answer Cell [phone]

## 2017-09-01 NOTE — ED Notes (Signed)
Pt resting wife at bedside

## 2017-09-01 NOTE — Progress Notes (Signed)
MD verbal order to give 30 units of novolog 70/30.

## 2017-09-01 NOTE — ED Notes (Signed)
I-Stat Troponin results of 0.11 reported to Dr.Pollina

## 2017-09-01 NOTE — ED Triage Notes (Signed)
Pt BIB [facility] d/t SOB for x1 day with & without exertion. Pt had Hx of x2 cardiac stent placed 07/2017

## 2017-09-01 NOTE — ED Notes (Signed)
Report given to Clarise Cruz, RN on floor.

## 2017-09-01 NOTE — ED Notes (Signed)
Have patient call his wife:  [name] @ [phone]

## 2017-09-01 NOTE — ED Notes (Signed)
Pt resting.

## 2017-09-02 ENCOUNTER — Inpatient Hospital Stay (HOSPITAL_COMMUNITY): Payer: Medicare Other

## 2017-09-02 DIAGNOSIS — I2699 Other pulmonary embolism without acute cor pulmonale: Secondary | ICD-10-CM

## 2017-09-02 LAB — GLUCOSE, CAPILLARY
Glucose-Capillary: 153 mg/dL — ABNORMAL HIGH (ref 70–99)
Glucose-Capillary: 183 mg/dL — ABNORMAL HIGH (ref 70–99)
Glucose-Capillary: 178 mg/dL — ABNORMAL HIGH (ref 70–99)
Glucose-Capillary: 172 mg/dL — ABNORMAL HIGH (ref 70–99)

## 2017-09-02 LAB — BASIC METABOLIC PANEL
Anion gap: 14 (ref 5–15)
BUN: 20 mg/dL (ref 8–23)
CO2: 28 mmol/L (ref 22–32)
Calcium: 9 mg/dL (ref 8.9–10.3)
Chloride: 96 mmol/L — ABNORMAL LOW (ref 98–111)
Creatinine, Ser: 1.36 mg/dL — ABNORMAL HIGH (ref 0.61–1.24)
GFR calc Af Amer: >60 mL/min (ref >60)
GFR calc non Af Amer: 55 mL/min — ABNORMAL LOW (ref >60)
Glucose, Bld: 145 mg/dL — ABNORMAL HIGH (ref 70–99)
Potassium: 3.3 mmol/L — ABNORMAL LOW (ref 3.5–5.1)
Sodium: 138 mmol/L (ref 135–145)

## 2017-09-02 LAB — CBC WITH DIFFERENTIAL/PLATELET
Abs Immature Granulocytes: 0.1 10*3/uL (ref 0.0–0.1)
Basophils Absolute: 0.1 10*3/uL (ref 0.0–0.1)
Basophils Relative: 1 %
Eosinophils Absolute: 1.6 10*3/uL — ABNORMAL HIGH (ref 0.0–0.7)
Eosinophils Relative: 12 %
HCT: 37.5 % — ABNORMAL LOW (ref 39.0–52.0)
Hemoglobin: 11.8 g/dL — ABNORMAL LOW (ref 13.0–17.0)
Immature Granulocytes: 1 %
Lymphocytes Relative: 4 %
Lymphs Abs: 0.6 10*3/uL — ABNORMAL LOW (ref 0.7–4.0)
MCH: 28.8 pg (ref 26.0–34.0)
MCHC: 31.5 g/dL (ref 30.0–36.0)
MCV: 91.5 fL (ref 78.0–100.0)
Monocytes Absolute: 0.9 10*3/uL (ref 0.1–1.0)
Monocytes Relative: 7 %
Neutro Abs: 10.6 10*3/uL — ABNORMAL HIGH (ref 1.7–7.7)
Neutrophils Relative %: 75 %
Platelets: 384 10*3/uL (ref 150–400)
RBC: 4.1 MIL/uL — ABNORMAL LOW (ref 4.22–5.81)
RDW: 13.1 % (ref 11.5–15.5)
WBC: 14 10*3/uL — ABNORMAL HIGH (ref 4.0–10.5)

## 2017-09-02 MED ORDER — FUROSEMIDE 10 MG/ML IJ SOLN
40.0000 mg | Freq: Two times a day (BID) | INTRAMUSCULAR | Status: DC
  Administered 2017-09-02 – 2017-09-03 (×3): 40 mg via INTRAVENOUS
  Filled 2017-09-02 (×3): qty 4

## 2017-09-02 MED ORDER — POTASSIUM CHLORIDE CRYS ER 10 MEQ PO TBCR
20.0000 meq | EXTENDED_RELEASE_TABLET | Freq: Two times a day (BID) | ORAL | Status: DC
  Administered 2017-09-02 – 2017-09-03 (×2): 20 meq via ORAL
  Filled 2017-09-02 (×4): qty 2

## 2017-09-02 NOTE — Care Management Note (Signed)
Case Management Note  Patient Details  Name: Martin Bush MRN: [id number] Date of Birth: Mar 13, 1955  Subjective/Objective:     CHF              Action/Plan: Patient lives with spouse; Doristine Church to the [place] for medical care; private insurance with [facility] with prescription drug coverage; [facility] choice [facility]; CM will continue to follow for progression of care.  Expected Discharge Date:    possibly 09/05/2017              Expected Discharge Plan:  Home/Self Care  Discharge planning Services  CM Consult     Status of Service:  In process, will continue to follow  Sherrilyn Rist [phone] 09/02/2017, 11:46 AM

## 2017-09-02 NOTE — Progress Notes (Signed)
Vascular lab called regarding US renal duplex.

## 2017-09-02 NOTE — Progress Notes (Signed)
SATURATION QUALIFICATIONS: (This note is used to comply with regulatory documentation for home oxygen)  Patient Saturations on Room Air at Rest = 97%  Patient Saturations on Room Air while Ambulating = 93%    Please briefly explain why patient needs home oxygen:  Jurline Folger, Tivis Ringer, RN

## 2017-09-02 NOTE — Progress Notes (Signed)
Subjective: He is feeling much better and has diuresed well. Still dyspneic but slept well. Fells his abdomen is less bloated.  Objective:  Vital Signs in the last 24 hours: Temp:  [97.9 F (36.6 C)-98.2 F (36.8 C)] 98.2 F (36.8 C) (08/09 0515) Pulse Rate:  [67-82] 72 (08/09 0515) Resp:  [15-23] 18 (08/09 0515) BP: (115-136)/(64-87) 127/76 (08/09 0515) SpO2:  [93 %-99 %] 93 % (08/09 0515) Weight:  [89.2 kg-89.3 kg] 89.2 kg (08/09 0515)  Intake/Output from previous day: 08/08 0701 - 08/09 0700 In: 480 [P.O.:480] Out: 2200 [Urine:2200]  Physical Exam: Blood pressure 110/67, pulse 72, temperature (!) 97.5 F (36.4 C), temperature source Oral, resp. rate 18, height 5\' 7"  (1.702 m), weight 89.2 kg, SpO2 92 %. Body mass index is 30.81 kg/m.  General appearance: alert, cooperative, fatigued, no distress and mildly obese Eyes: negative findings: lids and lashes normal Neck: no adenopathy, no carotid bruit, supple, symmetrical, trachea midline and thyroid not enlarged, symmetric, no tenderness/mass/nodules Neck: JVP - normal, carotids 2+= without bruits Resp: Faint bibasilar crackles, left base worse. Chest wall: no tenderness Cardio: regular rate and rhythm, S1, S2 normal, no murmur, click, rub or gallop GI: soft, non-tender; bowel sounds normal; no masses,  no organomegaly Extremities: extremities normal, atraumatic, no cyanosis or edema  Lab Results: BMP Recent Labs    08/21/17 0646 09/01/17 0310 09/02/17 0551  NA 136 133* 138  K 3.3* 3.6 3.3*  CL 97* 96* 96*  CO2 27 25 28   GLUCOSE 87 410* 145*  BUN 24* 21 20  CREATININE 1.39* 1.41* 1.36*  CALCIUM 8.8* 9.0 9.0  GFRNONAA 53* 52* 55*  GFRAA >60 >60 >60    CBC Recent Labs  Lab 09/02/17 0551  WBC 14.0*  RBC 4.10*  HGB 11.8*  HCT 37.5*  PLT 384  MCV 91.5  MCH 28.8  MCHC 31.5  RDW 13.1  LYMPHSABS 0.6*  MONOABS 0.9  EOSABS 1.6*  BASOSABS 0.1    HEMOGLOBIN A1C Lab Results  Component Value Date   HGBA1C  9.3 (H) 05/26/2016   MPG 263 07/12/2015    Cardiac Panel (last 3 results) Recent Labs    08/02/17 2219 08/03/17 0449 08/03/17 0746  TROPONINI 0.03* 0.04* 0.06*   Lipid Panel     Component Value Date/Time   CHOL 145 07/16/2014 0310   TRIG 223 (H) 07/16/2014 0310   HDL 26 (L) 07/16/2014 0310   CHOLHDL 5.6 07/16/2014 0310   VLDL 45 (H) 07/16/2014 0310   LDLCALC 74 07/16/2014 0310     Hepatic Function Panel Recent Labs    09/01/17 0310  PROT 7.2  ALBUMIN 2.9*  AST 19  ALT 27  ALKPHOS 103  BILITOT 1.3*   Imaging: Dg Chest 2 View  Result Date: 09/01/2017 CLINICAL DATA:  Acute onset of shortness of breath. EXAM: CHEST - 2 VIEW COMPARISON:  Chest radiograph performed 08/20/2017 FINDINGS: The lungs are well-aerated. Vascular congestion is noted. Increased interstitial markings raise concern for pulmonary edema. No significant pleural effusion or pneumothorax is seen. The heart is borderline normal in size. The patient is status post median sternotomy. A pacemaker/AICD is noted at the left chest wall, with leads ending at the right atrium, right ventricle and coronary sinus. No acute osseous abnormalities are seen. IMPRESSION: Vascular congestion noted. Increased interstitial markings raise concern for pulmonary edema. Electronically Signed   By: Garald Balding M.D.   On: 09/01/2017 04:06    Significant Diagnostic Studies:  Coronary angiogram 08/04/2017: Left main:Previously  placed stent in the proximal segment is patent but the distal end of the stent shows a in-stent restenosis of 60% followed by 80% stenosis in the distal left main.  Circumflex:Ostial circumflex stent is widely patent. Both the OM1 and PDA branches which are previously stented are occluded with no flow or collaterals evident. SVG to OM is known occluded vessel. TFT:DDUKGURK in the proximal segment, LIMA to LAD is widely patent and appears healthy. RCA: Previously known severely diffused and small  nondominant RCA. Not cannulated.  Intervention data: Successful balloon angioplasty of the left main with reduction of stenosis from 80% to less than 50%, successful angioplasty with a 2.0 x 12 mm emerge balloon of the OM branch of circumflex and also PDA branch of circumflex coronary artery, stenosis reduced from 100% to less than 20%. The large PDA branch has still haziness with residual luminal irregularity. TIMI-3 flow was established.  08/16/17: 1. Selective left coronary angiography 2. Successful orbital atherectomy w/ [place] Left main/prox LCx 3. Successful DES placement Lt PDA 3.0 X 20 Synergy Lt PDA 4. Successful DES placement Lt PDA 3.5 X 20 Synergy Left main  Coronary angio 08/20/2017: Patent angioplasty site   Echocardiogram 08/31/2017:- Left ventricle: The cavity size was mildly dilated. Wall   thickness was normal. Systolic function was severely reduced. The   estimated ejection fraction was in the range of 20% to 25%.   Moderate global hypokinesis. Severe distal anteroseptal   hypokinesis. Severe inferolateral hypokinesis. Apical akinesis.   Features are consistent with a pseudonormal left ventricular   filling pattern, with concomitant abnormal relaxation and   increased filling pressure (grade 2 diastolic dysfunction). - Right ventricle: Systolic function was mildly reduced. - Right atrium: Central venous pressure (est): 3 mm Hg. - This was a limited study to assess systolic and diastolic   function.   No significant change compared to previous study on 08/03/2017.  EKG: A sense Bi-V paced rhythm.   Renal Doppler 09/01/2017: Right: Abnormal right Resistive Index. No evidence of right renal artery stenosis. Left: Normal left Resistive Index. No evidence of left renal artery stenosis. Mesenteric: Normal Celiac artery findings. 70 to 99% stenosis in the superior mesenteric artery.  Scheduled Meds: . amiodarone  200 mg Oral Daily  . aspirin EC  81 mg Oral Daily  .  carvedilol  6.25 mg Oral BID WC  . furosemide  40 mg Intravenous BID  . heparin  5,000 Units Subcutaneous Q8H  . hydrALAZINE  25 mg Oral BID  . insulin aspart  0-9 Units Subcutaneous TID WC  . insulin aspart protamine- aspart  10-30 Units Subcutaneous BID WC  . isosorbide mononitrate  30 mg Oral Daily  . pravastatin  80 mg Oral QHS  . sertraline  50 mg Oral Daily  . sodium chloride flush  3 mL Intravenous Q12H  . ticagrelor  90 mg Oral BID   Continuous Infusions: . sodium chloride     PRN Meds:.sodium chloride, acetaminophen, ondansetron (ZOFRAN) IV, sodium chloride flush   Assessment/Plan:  1. Acute on Chronic systolic and diastolic CHF 2. CAD native vessel 3. Uncontrolled DM with hyperglycemia with stage 3 CKD. 4. Hypertension.  Rec: Add Lasix IV BID. Consider switching from NTG and Hydralazine to BiDil. Replete K. NO arrhythmia on telemetry. Bi-V paced rhythm. Discussed fluid and salt restriction. Evaluate for home O2 needs.   Adrian Prows, M.D. 09/02/2017, 10:25 AM [facility] Pager: [phone] Office: [phone] If no answer: [phone]

## 2017-09-02 NOTE — Progress Notes (Addendum)
*  PRELIMINARY RESULTS* Vascular Ultrasound Renal Artery Duplex has been completed.  Right kideny measuring 11.70cm  Left kidney length 11.20cm.  Preliminary findings:                                    Right:  Abnormal right renal Resistive Index. No evidence of right renal artery stenosis.                                     Left:    Normal left Resistive Index. No evidence of left renal artery stenosis.                                    Mesenteric:                                     -- Normal Celiac artery findings.                                      --70 to 99% stenosis in the superior mesenteric artery.    Martin Bush  Martin Bush (ARMDS RVT) 09/02/2017, 4:10 PM

## 2017-09-03 LAB — CBC WITH DIFFERENTIAL/PLATELET
Abs Immature Granulocytes: 0.2 10*3/uL — ABNORMAL HIGH (ref 0.0–0.1)
Basophils Absolute: 0.1 10*3/uL (ref 0.0–0.1)
Basophils Relative: 1 %
Eosinophils Absolute: 1.7 10*3/uL — ABNORMAL HIGH (ref 0.0–0.7)
Eosinophils Relative: 12 %
HCT: 38.6 % — ABNORMAL LOW (ref 39.0–52.0)
Hemoglobin: 12.4 g/dL — ABNORMAL LOW (ref 13.0–17.0)
Immature Granulocytes: 1 %
Lymphocytes Relative: 5 %
Lymphs Abs: 0.7 10*3/uL (ref 0.7–4.0)
MCH: 29 pg (ref 26.0–34.0)
MCHC: 32.1 g/dL (ref 30.0–36.0)
MCV: 90.4 fL (ref 78.0–100.0)
Monocytes Absolute: 0.9 10*3/uL (ref 0.1–1.0)
Monocytes Relative: 6 %
Neutro Abs: 10.7 10*3/uL — ABNORMAL HIGH (ref 1.7–7.7)
Neutrophils Relative %: 75 %
Platelets: 343 10*3/uL (ref 150–400)
RBC: 4.27 MIL/uL (ref 4.22–5.81)
RDW: 13.1 % (ref 11.5–15.5)
WBC: 14.3 10*3/uL — ABNORMAL HIGH (ref 4.0–10.5)

## 2017-09-03 LAB — GLUCOSE, CAPILLARY
Glucose-Capillary: 192 mg/dL — ABNORMAL HIGH (ref 70–99)
Glucose-Capillary: 246 mg/dL — ABNORMAL HIGH (ref 70–99)

## 2017-09-03 LAB — BRAIN NATRIURETIC PEPTIDE: B Natriuretic Peptide: 963.1 pg/mL — ABNORMAL HIGH (ref 0.0–100.0)

## 2017-09-03 LAB — BASIC METABOLIC PANEL
Anion gap: 15 (ref 5–15)
BUN: 29 mg/dL — ABNORMAL HIGH (ref 8–23)
CO2: 26 mmol/L (ref 22–32)
Calcium: 8.8 mg/dL — ABNORMAL LOW (ref 8.9–10.3)
Chloride: 92 mmol/L — ABNORMAL LOW (ref 98–111)
Creatinine, Ser: 1.46 mg/dL — ABNORMAL HIGH (ref 0.61–1.24)
GFR calc Af Amer: 58 mL/min — ABNORMAL LOW (ref >60)
GFR calc non Af Amer: 50 mL/min — ABNORMAL LOW (ref >60)
Glucose, Bld: 191 mg/dL — ABNORMAL HIGH (ref 70–99)
Potassium: 3.4 mmol/L — ABNORMAL LOW (ref 3.5–5.1)
Sodium: 133 mmol/L — ABNORMAL LOW (ref 135–145)

## 2017-09-03 MED ORDER — TORSEMIDE 20 MG PO TABS: 20.0000 mg | ORAL_TABLET | Freq: Two times a day (BID) | ORAL | 3 refills | Status: DC

## 2017-09-03 MED ORDER — POTASSIUM CHLORIDE CRYS ER 20 MEQ PO TBCR: 20.0000 meq | EXTENDED_RELEASE_TABLET | Freq: Two times a day (BID) | ORAL | 3 refills | Status: DC

## 2017-09-03 MED ORDER — POTASSIUM CHLORIDE CRYS ER 20 MEQ PO TBCR
40.0000 meq | EXTENDED_RELEASE_TABLET | Freq: Once | ORAL | Status: AC
  Administered 2017-09-03: 40 meq via ORAL
  Filled 2017-09-03: qty 2

## 2017-09-03 MED ORDER — METOLAZONE 2.5 MG PO TABS: 2.5000 mg | ORAL_TABLET | ORAL | 3 refills | Status: DC

## 2017-09-03 NOTE — Care Management Note (Signed)
Case Management Note CM weekend coverage for 3E [phone]  Patient Details  Name: Martin Bush MRN: [id number] Date of Birth: 1955-02-20  Subjective/Objective:  Pt admitted acute CHF                  Action/Plan: PTA pt lived at home with wife, call received from bedside RN that pt will need home 02- qualifying note in epic- call made to [facility] with [facility] for DME need- portable tank to be delivered to room prior to discharge.   Expected Discharge Date:  09/03/17               Expected Discharge Plan:  Home/Self Care  In-House Referral:     Discharge planning Services  CM Consult  Post Acute Care Choice:  Durable Medical Equipment Choice offered to:  Patient  DME Arranged:  Oxygen DME Agency:  [employer]:    [facility] Agency:     Status of Service:  Completed, signed off  If discussed at [place] of Stay Meetings, dates discussed:    Discharge Disposition: home/self care   Additional Comments:  Dawayne Patricia, RN 09/03/2017, 3:07 PM

## 2017-09-03 NOTE — Discharge Summary (Signed)
Physician Discharge Summary  Patient ID: Martin Bush MRN: [id number] DOB/AGE: 1955-12-23 62 y.o.  Admit date: 09/01/2017 Discharge date: 09/03/2017  Admission Diagnoses: Shortness of breath  Discharge Diagnoses:  Active Problems:   Diabetes ([place])   Essential hypertension   CAD (coronary artery disease)   CKD (chronic kidney disease), stage III (HCC)   Acute exacerbation of congestive heart failure (HCC)   Discharged Condition: stable  Hospital Course:   62 year old Caucasian male with hypertension, type 2 diabetes mellitus, hyperlipidemia, ischemic cardiomyopathy EF 20-25% status post ICD placement, coronary artery disease status post CABG 2 and multiple prior PCI's, recent atherectomy and complex PCI with overlapping stents in left main extending into left PDA with synergy 3.0 x 20 and 3.5 x 20 mm drug-eluting stents (08/16/2017), patent on cath on 08/20/2017, readmitted with shortness of breath, leg edema. He was diuresed with IV lasix with improvement on discharge. He is still getting hypoxic (86%) on ambulation and will need home oxygen, arranged.  I am discharging him on torsemide PO 20 mg bid, metolazone 2.5 mg every other day, Kdur 20 Eq bid. On follow up, consider switching hydralzine and Imdur to Bidil 20-37.5 mg tid. He is not on ARNI/AA due to tenuous renal function. Thi smay be his new normal. Could consider initiating Entresto outpatient.   Consults: None  Significant Diagnostic Studies: labs:  Results for Martin, Bush (MRN [id number]) as of 09/03/2017 12:33  Ref. Range 09/01/2017 03:10 09/01/2017 03:27 09/02/2017 05:51 09/03/2017 82:42  BASIC METABOLIC PANEL Unknown   Rpt (A) Rpt (A)  COMPREHENSIVE METABOLIC PANEL Unknown Rpt (A)     Sodium Latest Ref Range: 135 - 145 mmol/L 133 (L)  138 133 (L)  Potassium Latest Ref Range: 3.5 - 5.1 mmol/L 3.6  3.3 (L) 3.4 (L)  Chloride Latest Ref Range: 98 - 111 mmol/L 96 (L)  96 (L) 92 (L)  CO2 Latest Ref Range: 22 - 32 mmol/L 25  28  26   Glucose Latest Ref Range: 70 - 99 mg/dL 410 (H)  145 (H) 191 (H)  BUN Latest Ref Range: 8 - 23 mg/dL 21  20 29  (H)  Creatinine Latest Ref Range: 0.61 - 1.24 mg/dL 1.41 (H)  1.36 (H) 1.46 (H)  Calcium Latest Ref Range: 8.9 - 10.3 mg/dL 9.0  9.0 8.8 (L)  Anion gap Latest Ref Range: 5 - 15  12  14 15   Alkaline Phosphatase Latest Ref Range: 38 - 126 U/L 103     Albumin Latest Ref Range: 3.5 - 5.0 g/dL 2.9 (L)     AST Latest Ref Range: 15 - 41 U/L 19     ALT Latest Ref Range: 0 - 44 U/L 27     Total Protein Latest Ref Range: 6.5 - 8.1 g/dL 7.2     Total Bilirubin Latest Ref Range: 0.3 - 1.2 mg/dL 1.3 (H)     GFR, Est Non African American Latest Ref Range: >60 mL/min 52 (L)  55 (L) 50 (L)  GFR, Est African American Latest Ref Range: >60 mL/min >60  >60 58 (L)  B Natriuretic Peptide Latest Ref Range: 0.0 - 100.0 pg/mL 1,431.2 (H)     Troponin i, poc Latest Ref Range: 0.00 - 0.08 ng/mL  0.11 [facility])     Results for Martin Bush, Martin Bush (MRN [id number]) as of 09/03/2017 12:33  Ref. Range 09/01/2017 03:10 09/02/2017 05:51 09/03/2017 03:50  WBC Latest Ref Range: 4.0 - 10.5 K/uL 18.0 (H) 14.0 (H) 14.3 (H)  RBC Latest Ref Range: 4.22 -  5.81 MIL/uL 4.16 (L) 4.10 (L) 4.27  Hemoglobin Latest Ref Range: 13.0 - 17.0 g/dL 12.0 (L) 11.8 (L) 12.4 (L)  HCT Latest Ref Range: 39.0 - 52.0 % 38.6 (L) 37.5 (L) 38.6 (L)  MCV Latest Ref Range: 78.0 - 100.0 fL 92.8 91.5 90.4  MCH Latest Ref Range: 26.0 - 34.0 pg 28.8 28.8 29.0  MCHC Latest Ref Range: 30.0 - 36.0 g/dL 31.1 31.5 32.1  RDW Latest Ref Range: 11.5 - 15.5 % 13.1 13.1 13.1  Platelets Latest Ref Range: 150 - 400 K/uL 362 384 343  Neutrophils Latest Units: % 82 75 75  Lymphocytes Latest Units: % 3 4 5   Monocytes Relative Latest Units: % 6 7 6   Eosinophil Latest Units: % 7 12 12   Basophil Latest Units: % 1 1 1   NEUT# Latest Ref Range: 1.7 - 7.7 K/uL 14.8 (H) 10.6 (H) 10.7 (H)  Lymphocyte # Latest Ref Range: 0.7 - 4.0 K/uL 0.5 (L) 0.6 (L) 0.7  Monocyte # Latest Ref  Range: 0.1 - 1.0 K/uL 1.0 0.9 0.9  Eosinophils Absolute Latest Ref Range: 0.0 - 0.7 K/uL 1.3 (H) 1.6 (H) 1.7 (H)  Basophils Absolute Latest Ref Range: 0.0 - 0.1 K/uL 0.1 0.1 0.1  Immature Granulocytes Latest Units: % 1 1 1   Abs Immature Granulocytes Latest Ref Range: 0.0 - 0.1 K/uL 0.2 (H) 0.1 0.2 (H)    Treatments:  IV diuresis Net -3.5 L this morning  Discharge Exam: Blood pressure 127/77, pulse 70, temperature (!) 97.5 F (36.4 C), temperature source Oral, resp. rate 16, height 5\' 7"  (1.702 m), weight 89.4 kg, SpO2 97 %. Nursing noteand vitalsreviewed. Constitutional: He isoriented to person, place, and time. He appearswell-developedand well-nourished. He appearsIn no distress.  HENT:  Head:Normocephalicand atraumatic.  Cardiovascular:S1 normal,S2 normaland intact distal pulses.Tachycardiapresent.  Respiratory:No accessory muscle usagepresent. No tachypneanoted. He has no rales. Musculoskeletal: He exhibitsno edema  Neurological: He isalertand oriented to person, place, and time. Nocranial nerve deficit.  Skin: Skin iswarmand dry.  Psychiatric: He has anormal mood and affect.    Disposition: Discharge disposition: 01-Home or Self Care       Discharge Instructions    Diet - low sodium heart healthy   Complete by:  As directed    Heart Failure patients record your daily weight using the same scale at the same time of day   Complete by:  As directed    Increase activity slowly   Complete by:  As directed      Allergies as of 09/03/2017      Reactions   Diltiazem Rash      Medication List    TAKE these medications   amiodarone 200 MG tablet Commonly known as:  PACERONE Take 200 mg by mouth daily.   aspirin EC 81 MG tablet Take 81 mg by mouth daily.   carvedilol 12.5 MG tablet Commonly known as:  COREG Take 6.25 mg by mouth 2 (two) times daily with a meal.   Fish Oil 1000 MG Caps Take 2,000 mg by mouth 3 (three) times daily.    hydrALAZINE 25 MG tablet Commonly known as:  APRESOLINE Take 25 mg by mouth 2 (two) times daily.   hydrocortisone 2.5 % cream Apply topically 2 (two) times daily. Right back for itching What changed:    how much to take  when to take this  reasons to take this   insulin aspart protamine- aspart (70-30) 100 UNIT/ML injection Commonly known as:  NOVOLOG MIX 70/30 Inject 30  Units into the skin 2 (two) times daily with a meal.   isosorbide mononitrate 30 MG 24 hr tablet Commonly known as:  IMDUR Take 30 mg by mouth daily.   JARDIANCE 25 MG Tabs tablet Generic drug:  empagliflozin Take 25 mg by mouth daily.   metFORMIN 1000 MG tablet Commonly known as:  GLUCOPHAGE Take 1 tablet (1,000 mg total) by mouth 2 (two) times daily with a meal.   metolazone 2.5 MG tablet Commonly known as:  ZAROXOLYN Take 1 tablet (2.5 mg total) by mouth every other day.   mupirocin ointment 2 % Commonly known as:  BACTROBAN Apply 1 application topically 2 (two) times daily. To right back   potassium chloride SA 20 MEQ tablet Commonly known as:  K-DUR,KLOR-CON Take 1 tablet (20 mEq total) by mouth 2 (two) times daily.   pravastatin 80 MG tablet Commonly known as:  PRAVACHOL Take 80 mg by mouth at bedtime.   PRESCRIPTION MEDICATION Inhale into the lungs at bedtime. CPAP   sertraline 100 MG tablet Commonly known as:  ZOLOFT Take 50 mg by mouth daily.   ticagrelor 90 MG Tabs tablet Commonly known as:  BRILINTA Take 1 tablet (90 mg total) by mouth 2 (two) times daily.   torsemide 20 MG tablet Commonly known as:  DEMADEX Take 1 tablet (20 mg total) by mouth 2 (two) times daily.   VICTOZA [place] Inject 1.8 Units into the skin daily.   Vitamin D 2000 units tablet Take 2,000 Units by mouth 2 (two) times daily.            Durable Medical Equipment  (From admission, onward)         Start     Ordered   09/03/17 1051  For home use only DME oxygen  Once    Question Answer Comment   Mode or (Route) Nasal cannula   Liters per Minute 2   Frequency Continuous (stationary and portable oxygen unit needed)   Oxygen delivery system Gas      09/03/17 1051   09/02/17 1023  For home use only DME oxygen  Once    Question Answer Comment  Frequency Continuous (stationary and portable oxygen unit needed)   Oxygen conserving device No   Oxygen delivery system Gas      09/02/17 1023           Signed: Geryl Dohn J Ulisses Vondrak 09/03/2017, 12:29 PM   Yaretzy Olazabal Esther Hardy, MD [facility] [place] Pager: [phone] Office: [phone] If no answer Cell [phone]

## 2017-09-03 NOTE — Progress Notes (Addendum)
RA O2 saturation sitting on RA is 92%. Pt ambulating o2 saturation while walking dropped to 86% on room air.  2L of O2 made pt O2 sat increase to 97%.  Irven Baltimore, RN

## 2017-09-03 NOTE — Progress Notes (Signed)
SATURATION QUALIFICATIONS: (This note is used to comply with regulatory documentation for home oxygen)  Patient Saturations on Room Air at Rest = 92%  Patient Saturations on Room Air while Ambulating = 86%  Patient Saturations on 2 Liters of oxygen while Ambulating = 95%  Please briefly explain why patient needs home oxygen: 

## 2017-09-03 NOTE — Plan of Care (Signed)
  Problem: Health Behavior/Discharge Planning: Goal: Ability to manage health-related needs will improve Outcome: Completed/Met   Problem: Activity: Goal: Risk for activity intolerance will decrease Outcome: Completed/Met   Problem: Coping: Goal: Level of anxiety will decrease Outcome: Completed/Met   Problem: Elimination: Goal: Will not experience complications related to bowel motility Outcome: Completed/Met Goal: Will not experience complications related to urinary retention Outcome: Completed/Met   Problem: Pain Managment: Goal: General experience of comfort will improve Outcome: Completed/Met   Problem: Safety: Goal: Ability to remain free from injury will improve Outcome: Completed/Met

## 2017-09-05 DIAGNOSIS — I5043 Acute on chronic combined systolic (congestive) and diastolic (congestive) heart failure: Secondary | ICD-10-CM | POA: Diagnosis not present

## 2017-09-05 DIAGNOSIS — Z9861 Coronary angioplasty status: Secondary | ICD-10-CM | POA: Diagnosis not present

## 2017-09-05 DIAGNOSIS — I2510 Atherosclerotic heart disease of native coronary artery without angina pectoris: Secondary | ICD-10-CM | POA: Diagnosis not present

## 2017-09-05 DIAGNOSIS — I25810 Atherosclerosis of coronary artery bypass graft(s) without angina pectoris: Secondary | ICD-10-CM | POA: Diagnosis not present

## 2017-09-06 DIAGNOSIS — I5043 Acute on chronic combined systolic (congestive) and diastolic (congestive) heart failure: Secondary | ICD-10-CM | POA: Diagnosis not present

## 2017-09-14 ENCOUNTER — Ambulatory Visit (INDEPENDENT_AMBULATORY_CARE_PROVIDER_SITE_OTHER): Payer: Medicare Other | Admitting: Internal Medicine

## 2017-09-14 ENCOUNTER — Encounter: Payer: Self-pay | Admitting: Internal Medicine

## 2017-09-14 VITALS — BP 112/74 | HR 86 | Ht 67.0 in | Wt 201.2 lb

## 2017-09-14 DIAGNOSIS — I472 Ventricular tachycardia, unspecified: Secondary | ICD-10-CM

## 2017-09-14 DIAGNOSIS — I10 Essential (primary) hypertension: Secondary | ICD-10-CM | POA: Diagnosis not present

## 2017-09-14 DIAGNOSIS — Z95810 Presence of automatic (implantable) cardiac defibrillator: Secondary | ICD-10-CM | POA: Diagnosis not present

## 2017-09-14 DIAGNOSIS — Z951 Presence of aortocoronary bypass graft: Secondary | ICD-10-CM | POA: Diagnosis not present

## 2017-09-14 DIAGNOSIS — R0602 Shortness of breath: Secondary | ICD-10-CM | POA: Diagnosis not present

## 2017-09-14 NOTE — Progress Notes (Signed)
HPI [name] is referred today for evaluation of VT. He is a pleasant 62 yo man with an ICM, chronic systolic heart failure, EF 20%, prior MI, s/p biv ICD insertion. 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 He is referred for additional evaluation. The patient has been in the hospital on several occaisions since moving back to [place] from [place]. He is trying to reduce his salt intake. Allergies  Allergen Reactions  . Diltiazem Rash     Current Outpatient Medications  Medication Sig Dispense Refill  . amiodarone (PACERONE) 200 MG tablet Take 200 mg by mouth daily.     [name] Kitchen aspirin EC 81 MG tablet Take 81 mg by mouth daily.    . carvedilol (COREG) 12.5 MG tablet Take 6.25 mg by mouth 2 (two) times daily with a meal.    . Cholecalciferol (VITAMIN D) 2000 units tablet Take 2,000 Units by mouth 2 (two) times daily.    . empagliflozin (JARDIANCE) 25 MG TABS tablet Take 25 mg by mouth daily.    . hydrALAZINE (APRESOLINE) 25 MG tablet Take 25 mg by mouth 2 (two) times daily.    . hydrocortisone 2.5 % cream Apply topically 2 (two) times daily. Right back for itching (Patient taking differently: Apply 1 application topically 2 (two) times daily as needed (itching). Right back for itching) 30 g 1  . insulin aspart protamine- aspart (NOVOLOG MIX 70/30) (70-30) 100 UNIT/ML injection Inject 30 Units into the skin 2 (two) times daily with a meal.     . isosorbide mononitrate (IMDUR) 30 MG 24 hr tablet Take 30 mg by mouth daily.    . Liraglutide (VICTOZA [place]) Inject 1.8 Units into the skin daily.    . metFORMIN (GLUCOPHAGE) 1000 MG tablet Take 1 tablet (1,000 mg total) by mouth 2 (two) times daily with a meal.    . metolazone (ZAROXOLYN) 2.5 MG tablet Take 1 tablet (2.5 mg total) by mouth every other day. 60 tablet 3  . mupirocin ointment (BACTROBAN) 2 % Apply 1 application topically 2 (two) times daily. To right back 30 g 0  . Omega-3 Fatty Acids (FISH OIL) 1000 MG CAPS Take 2,000  mg by mouth 3 (three) times daily.    . potassium chloride SA (K-DUR,KLOR-CON) 20 MEQ tablet Take 1 tablet (20 mEq total) by mouth 2 (two) times daily. 60 tablet 3  . pravastatin (PRAVACHOL) 80 MG tablet Take 80 mg by mouth at bedtime.     [name] Kitchen PRESCRIPTION MEDICATION Inhale into the lungs at bedtime. CPAP    . sertraline (ZOLOFT) 100 MG tablet Take 50 mg by mouth daily.     . ticagrelor (BRILINTA) 90 MG TABS tablet Take 1 tablet (90 mg total) by mouth 2 (two) times daily. 60 tablet 3  . torsemide (DEMADEX) 20 MG tablet Take 1 tablet (20 mg total) by mouth 2 (two) times daily. 60 tablet 3   No current facility-administered medications for this visit.      Past Medical History:  Diagnosis Date  . AICD (automatic cardioverter/defibrillator) present 2016  . Arthritis    "thumbs"  (08/02/2017)  . CHF (congestive heart failure) ([place])   . Colon polyps   . Coronary artery disease   . Depression   . High cholesterol   . Hypertension   . MI (myocardial infarction) ([place]) 2003  . OSA on CPAP   . Pneumonia 10/2014  . Proteinuria   . Type II diabetes mellitus ([place])  insulin dependent    ROS:   All systems reviewed and negative except as noted in the HPI.   Past Surgical History:  Procedure Laterality Date  . CARDIAC CATHETERIZATION N/A 07/16/2014   Procedure: Left Heart Cath and Coronary Angiography;  Surgeon: Adrian Prows, MD;  Location: [place] [facility];  Service: Cardiovascular;  Laterality: N/A;  . CARDIAC CATHETERIZATION  07/16/2014   Procedure: Coronary Balloon Angioplasty;  Surgeon: Adrian Prows, MD;  Location: [place] [facility];  Service: Cardiovascular;;  . CARDIAC CATHETERIZATION  2003  . CARDIAC DEFIBRILLATOR PLACEMENT  2016  . CATARACT EXTRACTION W/ INTRAOCULAR LENS IMPLANT Left 03/2014  . CORONARY ANGIOPLASTY WITH STENT PLACEMENT     "I've got a total of 8 stents in there; mostly doine at [facility]" (08/02/2017)  . CORONARY ARTERY BYPASS GRAFT  ~ 2003   "CABG X2"; [facility]  . CORONARY ATHERECTOMY N/A 08/16/2017   Procedure: CORONARY ATHERECTOMY;  Surgeon: Nigel Mormon, MD;  Location: [place] [facility];  Service: Cardiovascular;  Laterality: N/A;  . CORONARY BALLOON ANGIOPLASTY N/A 08/04/2017   Procedure: CORONARY BALLOON ANGIOPLASTY;  Surgeon: Adrian Prows, MD;  Location: [place] [facility];  Service: Cardiovascular;  Laterality: N/A;  . CORONARY STENT INTERVENTION N/A 08/16/2017   Procedure: CORONARY STENT INTERVENTION;  Surgeon: Nigel Mormon, MD;  Location: [place] [facility];  Service: Cardiovascular;  Laterality: N/A;  . ELBOW SURGERY Left ?2001   "pinched nerve"  . LEFT HEART CATH AND CORS/GRAFTS ANGIOGRAPHY N/A 08/04/2017   Procedure: LEFT HEART CATH AND CORS/GRAFTS ANGIOGRAPHY;  Surgeon: Adrian Prows, MD;  Location: [place] [facility];  Service: Cardiovascular;  Laterality: N/A;  . LEFT HEART CATH AND CORS/GRAFTS ANGIOGRAPHY N/A 08/20/2017   Procedure: LEFT HEART CATH AND CORS/GRAFTS ANGIOGRAPHY;  Surgeon: Nigel Mormon, MD;  Location: [place] [facility];  Service: Cardiovascular;  Laterality: N/A;     Family History  Problem Relation Age of Onset  . Uterine cancer Mother   . Lung cancer Mother   . Hyperlipidemia Father   . Heart disease Father   . Hypertension Father   . Diabetes Father      Social History   Socioeconomic History  . Marital status: Married    Spouse name: Not on file  . Number of children: Not on file  . Years of education: Not on file  . Highest education level: Not on file  Occupational History  . Not on file  Social Needs  . Financial resource strain: Not hard at all  . Food insecurity:    Worry: Never true    Inability: Never true  . Transportation needs:    Medical: No    Non-medical: No  Tobacco Use  . Smoking status: Never Smoker  . Smokeless tobacco: Former Network engineer and Sexual Activity  . Alcohol use: Yes    Comment: 08/02/2017 "maybe 1 drink/year"  . Drug use: Never  . Sexual  activity: Not Currently  Lifestyle  . Physical activity:    Days per week: 0 days    Minutes per session: Not on file  . Stress: Not at all  Relationships  . Social connections:    Talks on phone: Twice a week    Gets together: Twice a week    Attends religious service: More than 4 times per year    Active member of club or organization: Yes    Attends meetings of clubs or organizations: Never    Relationship status:  Married  . Intimate partner violence:    Fear of current or ex partner: No    Emotionally abused: No    Physically abused: No    Forced sexual activity: No  Other Topics Concern  . Not on file  Social History Narrative  . Not on file     BP 112/74   Pulse 86   Ht 5\' 7"  (1.702 m)   Wt 201 lb 3.2 oz (91.3 kg)   BMI 31.51 kg/m   Physical Exam:  Well appearing 62 yo man, NAD HEENT: Unremarkable Neck:  6 cm JVD, no thyromegally Lymphatics:  No adenopathy Back:  No CVA tenderness Lungs:  Clear with no wheezes HEART:  Regular rate rhythm, no murmurs, no rubs, no clicks Abd:  soft, positive bowel sounds, no organomegally, no rebound, no guarding Ext:  2 plus pulses, no edema, no cyanosis, no clubbing Skin:  No rashes no nodules Neuro:  CN II through XII intact, motor grossly intact  EKG - reviewed. NSR with biv pacing  DEVICE  Normal device function.  See PaceArt for details.   Assess/Plan: 1. VT - he is stable on amiodarone. I would anticipate weaning down next year but would continue 200 mg daily for now. 2. ICD - today I adjusted his zones. His biv ICD is working normally. His fluid indiex is stable. 3. Chronic systolic heart  Failure - he appears to be euvolemic. He has adjusted his medications.  Mikle Bosworth.D.

## 2017-09-14 NOTE — Patient Instructions (Addendum)

## 2017-09-15 DIAGNOSIS — I2510 Atherosclerotic heart disease of native coronary artery without angina pectoris: Secondary | ICD-10-CM | POA: Diagnosis not present

## 2017-09-15 DIAGNOSIS — I25810 Atherosclerosis of coronary artery bypass graft(s) without angina pectoris: Secondary | ICD-10-CM | POA: Diagnosis not present

## 2017-09-15 DIAGNOSIS — I5043 Acute on chronic combined systolic (congestive) and diastolic (congestive) heart failure: Secondary | ICD-10-CM | POA: Diagnosis not present

## 2017-09-15 DIAGNOSIS — Z95810 Presence of automatic (implantable) cardiac defibrillator: Secondary | ICD-10-CM | POA: Diagnosis not present

## 2017-09-18 LAB — CUP PACEART INCLINIC DEVICE CHECK
Battery Remaining Longevity: 71 mo
Battery Voltage: 2.98 V
Brady Statistic AP VP Percent: 8.59 %
Brady Statistic AP VS Percent: 0.15 %
Brady Statistic AS VP Percent: 88.16 %
Brady Statistic AS VS Percent: 3.1 %
Brady Statistic RA Percent Paced: 8.62 %
Brady Statistic RV Percent Paced: 11.45 %
Date Time Interrogation Session: 20190821183027
HighPow Impedance: 63 Ohm
Implantable Lead Implant Date: 20161024
Implantable Lead Implant Date: 20161024
Implantable Lead Implant Date: 20161024
Implantable Lead Location: 753858
Implantable Lead Location: 753859
Implantable Lead Location: 753860
Implantable Lead Model: 4598
Implantable Lead Model: 5076
Implantable Pulse Generator Implant Date: 20161024
Lead Channel Impedance Value: 171 Ohm
Lead Channel Impedance Value: 171 Ohm
Lead Channel Impedance Value: 184.154
Lead Channel Impedance Value: 184.154
Lead Channel Impedance Value: 184.154
Lead Channel Impedance Value: 342 Ohm
Lead Channel Impedance Value: 342 Ohm
Lead Channel Impedance Value: 342 Ohm
Lead Channel Impedance Value: 342 Ohm
Lead Channel Impedance Value: 399 Ohm
Lead Channel Impedance Value: 399 Ohm
Lead Channel Impedance Value: 418 Ohm
Lead Channel Impedance Value: 418 Ohm
Lead Channel Impedance Value: 551 Ohm
Lead Channel Impedance Value: 589 Ohm
Lead Channel Impedance Value: 608 Ohm
Lead Channel Impedance Value: 608 Ohm
Lead Channel Impedance Value: 608 Ohm
Lead Channel Pacing Threshold Amplitude: 0.75 V
Lead Channel Pacing Threshold Amplitude: 0.875 V
Lead Channel Pacing Threshold Amplitude: 1 V
Lead Channel Pacing Threshold Pulse Width: 0.4 ms
Lead Channel Pacing Threshold Pulse Width: 0.4 ms
Lead Channel Pacing Threshold Pulse Width: 0.4 ms
Lead Channel Sensing Intrinsic Amplitude: 13.125 mV
Lead Channel Sensing Intrinsic Amplitude: 2.125 mV
Lead Channel Setting Pacing Amplitude: 1.5 V
Lead Channel Setting Pacing Amplitude: 1.5 V
Lead Channel Setting Pacing Amplitude: 2 V
Lead Channel Setting Pacing Pulse Width: 0.4 ms
Lead Channel Setting Pacing Pulse Width: 0.4 ms
Lead Channel Setting Sensing Sensitivity: 0.3 mV

## 2017-09-19 ENCOUNTER — Encounter: Payer: Self-pay | Admitting: Family Medicine

## 2017-09-19 ENCOUNTER — Ambulatory Visit (INDEPENDENT_AMBULATORY_CARE_PROVIDER_SITE_OTHER): Payer: Medicare Other | Admitting: Family Medicine

## 2017-09-19 VITALS — BP 90/60 | HR 78 | Temp 97.6°F | Wt 202.0 lb

## 2017-09-19 DIAGNOSIS — I2510 Atherosclerotic heart disease of native coronary artery without angina pectoris: Secondary | ICD-10-CM

## 2017-09-19 DIAGNOSIS — I5022 Chronic systolic (congestive) heart failure: Secondary | ICD-10-CM

## 2017-09-19 DIAGNOSIS — E1165 Type 2 diabetes mellitus with hyperglycemia: Secondary | ICD-10-CM

## 2017-09-19 DIAGNOSIS — R634 Abnormal weight loss: Secondary | ICD-10-CM | POA: Diagnosis not present

## 2017-09-19 DIAGNOSIS — Z794 Long term (current) use of insulin: Secondary | ICD-10-CM

## 2017-09-19 DIAGNOSIS — I10 Essential (primary) hypertension: Secondary | ICD-10-CM | POA: Diagnosis not present

## 2017-09-19 LAB — CBC WITH DIFFERENTIAL/PLATELET
Basophils Absolute: 0.1 10*3/uL (ref 0.0–0.1)
Basophils Relative: 1.1 % (ref 0.0–3.0)
Eosinophils Absolute: 0.4 10*3/uL (ref 0.0–0.7)
Eosinophils Relative: 5.5 % — ABNORMAL HIGH (ref 0.0–5.0)
HCT: 34 % — ABNORMAL LOW (ref 39.0–52.0)
Hemoglobin: 11.1 g/dL — ABNORMAL LOW (ref 13.0–17.0)
Lymphocytes Relative: 11.2 % — ABNORMAL LOW (ref 12.0–46.0)
Lymphs Abs: 0.8 10*3/uL (ref 0.7–4.0)
MCHC: 32.6 g/dL (ref 30.0–36.0)
MCV: 89.6 fl (ref 78.0–100.0)
Monocytes Absolute: 0.5 10*3/uL (ref 0.1–1.0)
Monocytes Relative: 7.8 % (ref 3.0–12.0)
Neutro Abs: 5.1 10*3/uL (ref 1.4–7.7)
Neutrophils Relative %: 74.4 % (ref 43.0–77.0)
Platelets: 320 10*3/uL (ref 150.0–400.0)
RBC: 3.79 Mil/uL — ABNORMAL LOW (ref 4.22–5.81)
RDW: 15.6 % — ABNORMAL HIGH (ref 11.5–15.5)
WBC: 6.8 10*3/uL (ref 4.0–10.5)

## 2017-09-19 LAB — HEMOGLOBIN A1C: Hgb A1c MFr Bld: 7.9 % — ABNORMAL HIGH (ref 4.6–6.5)

## 2017-09-19 LAB — COMPREHENSIVE METABOLIC PANEL
ALT: 16 U/L (ref 0–53)
AST: 20 U/L (ref 0–37)
Albumin: 3.5 g/dL (ref 3.5–5.2)
Alkaline Phosphatase: 89 U/L (ref 39–117)
BUN: 20 mg/dL (ref 6–23)
CO2: 26 mEq/L (ref 19–32)
Calcium: 9.6 mg/dL (ref 8.4–10.5)
Chloride: 102 mEq/L (ref 96–112)
Creatinine, Ser: 1.39 mg/dL (ref 0.40–1.50)
GFR: 55.08 mL/min — ABNORMAL LOW (ref >60.00)
Glucose, Bld: 143 mg/dL — ABNORMAL HIGH (ref 70–99)
Potassium: 4.7 mEq/L (ref 3.5–5.1)
Sodium: 136 mEq/L (ref 135–145)
Total Bilirubin: 0.7 mg/dL (ref 0.2–1.2)
Total Protein: 7.3 g/dL (ref 6.0–8.3)

## 2017-09-19 LAB — TSH: TSH: 2.3 u[IU]/mL (ref 0.35–4.50)

## 2017-09-19 LAB — PSA: PSA: 2.78 ng/mL (ref 0.10–4.00)

## 2017-09-19 NOTE — Patient Instructions (Addendum)
Nice to see you. We will check lab work today and contact you with the results. Please continue to see your cardiologist. We will have you return in 2 weeks for a weight check with nursing.

## 2017-09-19 NOTE — Assessment & Plan Note (Signed)
I suspect his weight loss is related to diuresis as well as recurrent hospitalizations and difficulty with his dentures.  We will check basic lab work to evaluate for other causes.  We will see him back in 2 weeks for a weight check and if trending down consider further evaluation.

## 2017-09-19 NOTE — Assessment & Plan Note (Signed)
Check A1c.  Continue current regimen. 

## 2017-09-19 NOTE — Assessment & Plan Note (Signed)
Well-controlled and borderline low.  He will follow-up with his cardiologist regarding this.

## 2017-09-19 NOTE — Assessment & Plan Note (Signed)
Overall doing well.  Appears to be euvolemic today.  Ambulatory oxygen saturation of 95%.  He will continue to see cardiology and have his current regimen managed through them.

## 2017-09-19 NOTE — Assessment & Plan Note (Signed)
He will continue to see cardiology and have his current regimen managed through them.

## 2017-09-19 NOTE — Progress Notes (Signed)
Tommi Rumps, MD Phone: [phone]  Martin Bush is a 62 y.o. male who presents today for follow-up  CC: Diabetes, hypertension, CAD, CHF, weight loss  DIABETES Disease Monitoring: Blood Sugar ranges-lower 100s Polyuria/phagia/dipsia- no      Optho- UTD, requesting records Medications: Compliance- taking metformin, victoza, jardiance, novolog BID prn per patient report Hypoglycemic symptoms- 2x/month, eats and feels better Patient did see endocrinology.  Since his last visit the patient has undergone multiple catheterizations with angioplasty on the first 1 and then atherectomy with complex PCI placement on the second cath.  He has been hospitalized on several occasions for pulmonary edema.  He was found to have a significantly reduced EF as well.  He follows with his cardiologist every 10 days recently and sees them sometimes next week.  He has followed with EP as well and had his ICD reprogrammed.  He currently is taking torsemide as needed.  Also on Brilinta, Imdur, carvedilol, hydralazine, amiodarone.  He feels quite well at this time.  He reports losing 60 pounds related to fluid loss from diuresis and not being able to eat as much related to his dentures.  His colonoscopy is up-to-date and had no polyps.  No family history of prostate cancer.  A couple of times he has had sweats at night though has not been drenched.  He did check his sugars when those were occurring and they were around 100.  He has had no abnormal bruising.  No chest pain, shortness of breath, abdominal pain, diarrhea, nausea, vomiting, or anxiety or depression.  He overall feels well.  He was discharged on oxygen though reports over the last 2 days he has not used it as he is felt well.     Social History   Tobacco Use  Smoking Status Never Smoker  Smokeless Tobacco Former User     ROS see history of present illness  Objective  Physical Exam Vitals:   09/19/17 1025  BP: 90/60  Pulse: 78  Temp:  97.6 F (36.4 C)  SpO2: 96%    BP Readings from Last 3 Encounters:  09/19/17 90/60  09/14/17 112/74  09/03/17 127/77   Wt Readings from Last 3 Encounters:  09/19/17 202 lb (91.6 kg)  09/14/17 201 lb 3.2 oz (91.3 kg)  09/03/17 197 lb 3.2 oz (89.4 kg)    Physical Exam  Constitutional: No distress.  Cardiovascular: Normal rate, regular rhythm and normal heart sounds.  Pulmonary/Chest: Effort normal and breath sounds normal.  Abdominal: Soft. Bowel sounds are normal. He exhibits no distension. There is no tenderness.  Musculoskeletal: He exhibits no edema.  Lymphadenopathy:    He has no cervical adenopathy.    He has no axillary adenopathy.       Right: No inguinal and no supraclavicular adenopathy present.       Left: No inguinal and no supraclavicular adenopathy present.  Neurological: He is alert.  Skin: Skin is warm and dry. He is not diaphoretic.  Psychiatric: He has a normal mood and affect.     Assessment/Plan: Please see individual problem list.  Essential hypertension Well-controlled and borderline low.  He will follow-up with his cardiologist regarding this.  CAD (coronary artery disease) He will continue to see cardiology and have his current regimen managed through them.  CHF (congestive heart failure) (HCC) Overall doing well.  Appears to be euvolemic today.  Ambulatory oxygen saturation of 95%.  He will continue to see cardiology and have his current regimen managed through them.  Diabetes (  HCC) Check A1c.  Continue current regimen.  Weight loss I suspect his weight loss is related to diuresis as well as recurrent hospitalizations and difficulty with his dentures.  We will check basic lab work to evaluate for other causes.  We will see him back in 2 weeks for a weight check and if trending down consider further evaluation.    Orders Placed This Encounter  Procedures  . CBC w/Diff  . HgB A1c  . TSH  . Comp Met (CMET)  . PSA    No orders of the  defined types were placed in this encounter.   Patient was noted to feel lightheaded after having labs completed.  He was brought back into the exam room.  He did not have a syncopal episode.  No chest pain, shortness breath, or palpitations.  Orthostatic vital signs were completed and positive.  They are listed below.  I discussed standing slowly.  We will check his lab results to ensure he is not dehydrated.  He will follow-up with his cardiologist to consider alteration in his regimen.  Laying blood pressure 116/78 Sitting blood pressure 100/60 Standing blood pressure 90/58   Tommi Rumps, MD [place]

## 2017-09-20 ENCOUNTER — Telehealth: Payer: Self-pay

## 2017-09-20 DIAGNOSIS — D649 Anemia, unspecified: Secondary | ICD-10-CM

## 2017-09-20 NOTE — Progress Notes (Signed)
faxed

## 2017-09-20 NOTE — Telephone Encounter (Signed)
-----   Message from [name], MD sent at 09/19/2017  6:32 PM EDT ----- Please let the patient know that his A1c is 7.9 and somewhat improved from our last check.  He needs to follow-up with his endocrinologist at the [place].  He is slightly more anemic than he was when his labs were last checked.  I would like to check this again and check iron studies.  Please place an order for a CBC as well as iron, TIBC, and ferritin panel for anemia.  He should return in 1 week for these to be repeated.  His other lab work is acceptable and does not reveal a cause for his weight loss.  We will see what his weight does on recheck in 2 weeks.  Thanks.

## 2017-09-27 DIAGNOSIS — R0602 Shortness of breath: Secondary | ICD-10-CM | POA: Diagnosis not present

## 2017-09-30 DIAGNOSIS — I25810 Atherosclerosis of coronary artery bypass graft(s) without angina pectoris: Secondary | ICD-10-CM | POA: Diagnosis not present

## 2017-09-30 DIAGNOSIS — I5043 Acute on chronic combined systolic (congestive) and diastolic (congestive) heart failure: Secondary | ICD-10-CM | POA: Diagnosis not present

## 2017-09-30 DIAGNOSIS — I2510 Atherosclerotic heart disease of native coronary artery without angina pectoris: Secondary | ICD-10-CM | POA: Diagnosis not present

## 2017-09-30 DIAGNOSIS — Z95810 Presence of automatic (implantable) cardiac defibrillator: Secondary | ICD-10-CM | POA: Diagnosis not present

## 2017-10-05 ENCOUNTER — Telehealth: Payer: Self-pay | Admitting: Family Medicine

## 2017-10-05 ENCOUNTER — Ambulatory Visit: Payer: Medicare Other

## 2017-10-05 NOTE — Telephone Encounter (Signed)
Patient's weight is up 2 pounds  Does patient feel he needs earlier f/u regarding his weightloss or does he feel like trending up?   offer earlier f/u with pcp or acute visit with me if needed

## 2017-10-05 NOTE — Telephone Encounter (Signed)
Patient came into office as per Dr. Caryl Bis note from 09/19/17 patient weight today was 204LBS.

## 2017-10-06 NOTE — Telephone Encounter (Signed)
Left voice mail for patient to call back ok for PEC to speak to patient    

## 2017-10-07 DIAGNOSIS — I6523 Occlusion and stenosis of bilateral carotid arteries: Secondary | ICD-10-CM | POA: Diagnosis not present

## 2017-10-07 DIAGNOSIS — I5043 Acute on chronic combined systolic (congestive) and diastolic (congestive) heart failure: Secondary | ICD-10-CM | POA: Diagnosis not present

## 2017-10-07 NOTE — Telephone Encounter (Signed)
noted 

## 2017-10-07 NOTE — Telephone Encounter (Signed)
Spoke with patient he has been maintaining between 201-202 for the last week.   He is taking toresmide 20 mg 1 qd. He will continue to monitor his weight and call us if it increases more than 2lbs for appointment

## 2017-10-11 NOTE — Telephone Encounter (Signed)
Noted as well.  Please see if we can have him come in in another 2 weeks for another weight check to ensure that he is stable and not trending down.

## 2017-10-12 NOTE — Telephone Encounter (Signed)
Called and spoke with pt. Pt advised and has been scheduled for a NV to recheck weight. Scheduled for 10/26/2017

## 2017-10-19 ENCOUNTER — Encounter: Payer: Self-pay | Admitting: Family

## 2017-10-19 ENCOUNTER — Ambulatory Visit (INDEPENDENT_AMBULATORY_CARE_PROVIDER_SITE_OTHER): Payer: Medicare Other | Admitting: Family

## 2017-10-19 ENCOUNTER — Ambulatory Visit (INDEPENDENT_AMBULATORY_CARE_PROVIDER_SITE_OTHER): Payer: Medicare Other

## 2017-10-19 ENCOUNTER — Telehealth: Payer: Self-pay

## 2017-10-19 VITALS — BP 118/74 | HR 77 | Temp 98.0°F | Resp 15 | Ht 67.0 in | Wt 208.4 lb

## 2017-10-19 DIAGNOSIS — L089 Local infection of the skin and subcutaneous tissue, unspecified: Secondary | ICD-10-CM

## 2017-10-19 DIAGNOSIS — S9032XA Contusion of left foot, initial encounter: Secondary | ICD-10-CM | POA: Diagnosis not present

## 2017-10-19 MED ORDER — DOXYCYCLINE HYCLATE 100 MG PO TBEC: 100.0000 mg | DELAYED_RELEASE_TABLET | Freq: Two times a day (BID) | ORAL | 0 refills | Status: AC

## 2017-10-19 NOTE — Telephone Encounter (Signed)
Called pharmacy and advised ok to dispense Doxycycline hyclate 100mg  tablet spoke with Harmon Pier

## 2017-10-19 NOTE — Telephone Encounter (Signed)
Copied from [place] [id number]. Topic: Quick Communication - See Telephone Encounter >> Oct 19, 2017  2:28 PM Antonieta Iba C wrote: CRM for notification. See Telephone encounter for: 10/19/17.  Pharmacy called in to be advised. They received Rx for doxycycline (DORYX) 100 MG EC tablet, they don't have doxycycline (DORYX) Hyclate 100 MG EC tablet instead?  CB: [phone]

## 2017-10-19 NOTE — Patient Instructions (Signed)
Start doxycycline; stay away from sun on on this medication as can cause sunburn  Wound recheck Friday or Monday; of course call us if not improving  Xr ray today   If there is no improvement in your symptoms, or if there is any worsening of symptoms, or if you have any additional concerns, please return for re-evaluation; or, if we are closed, consider going to the Emergency Room for evaluation if symptoms urgent.

## 2017-10-19 NOTE — Progress Notes (Signed)
Subjective:    Patient ID: Martin Bush, male    DOB: [birth year], 62 y.o.   MRN: [id number]  CC: Martin Bush is a 62 y.o. male who presents today for an acute visit.    HPI: CC: left toe redness, 6 days, unchanged. Dropped 'jack part' of trailer on his toe.   NO pain with walking, movement. No numbness, tingling., No fever, chills,n, vomiting.   Bleeding stopped. NO purulent discharge  H/o CHF, has ICD, DM. No cp. Last a1c 7.9.   No h/o gout, MRSA  On brilinta     HISTORY:  Past Medical History:  Diagnosis Date  . AICD (automatic cardioverter/defibrillator) present 2016  . Arthritis    "thumbs"  (08/02/2017)  . CHF (congestive heart failure) ([place])   . Colon polyps   . Coronary artery disease   . Depression   . High cholesterol   . Hypertension   . MI (myocardial infarction) ([place]) 2003  . OSA on CPAP   . Pneumonia 10/2014  . Proteinuria   . Type II diabetes mellitus (H[facility])    insulin dependent   Past Surgical History:  Procedure Laterality Date  . CARDIAC CATHETERIZATION N/A 07/16/2014   Procedure: Left Heart Cath and Coronary Angiography;  Surgeon: Adrian Prows, MD;  Location: [place] [facility];  Service: Cardiovascular;  Laterality: N/A;  . CARDIAC CATHETERIZATION  07/16/2014   Procedure: Coronary Balloon Angioplasty;  Surgeon: Adrian Prows, MD;  Location: [place] [facility];  Service: Cardiovascular;;  . CARDIAC CATHETERIZATION  2003  . CARDIAC DEFIBRILLATOR PLACEMENT  2016  . CATARACT EXTRACTION W/ INTRAOCULAR LENS IMPLANT Left 03/2014  . CORONARY ANGIOPLASTY WITH STENT PLACEMENT     "I've got a total of 8 stents in there; mostly doine at [facility]" (08/02/2017)  . CORONARY ARTERY BYPASS GRAFT  ~ 2003   "CABG X2"; [facility]  . CORONARY ATHERECTOMY N/A 08/16/2017   Procedure: CORONARY ATHERECTOMY;  Surgeon: Nigel Mormon, MD;  Location: [place] [facility];  Service: Cardiovascular;  Laterality: N/A;  . CORONARY BALLOON ANGIOPLASTY N/A 08/04/2017   Procedure: CORONARY BALLOON ANGIOPLASTY;  Surgeon: Adrian Prows, MD;  Location: [place] [facility];  Service: Cardiovascular;  Laterality: N/A;  . CORONARY STENT INTERVENTION N/A 08/16/2017   Procedure: CORONARY STENT INTERVENTION;  Surgeon: Nigel Mormon, MD;  Location: [place] [facility];  Service: Cardiovascular;  Laterality: N/A;  . ELBOW SURGERY Left ?2001   "pinched nerve"  . LEFT HEART CATH AND CORS/GRAFTS ANGIOGRAPHY N/A 08/04/2017   Procedure: LEFT HEART CATH AND CORS/GRAFTS ANGIOGRAPHY;  Surgeon: Adrian Prows, MD;  Location: [place] [facility];  Service: Cardiovascular;  Laterality: N/A;  . LEFT HEART CATH AND CORS/GRAFTS ANGIOGRAPHY N/A 08/20/2017   Procedure: LEFT HEART CATH AND CORS/GRAFTS ANGIOGRAPHY;  Surgeon: Nigel Mormon, MD;  Location: [place] [facility];  Service: Cardiovascular;  Laterality: N/A;   Family History  Problem Relation Age of Onset  . Uterine cancer Mother   . Lung cancer Mother   . Hyperlipidemia Father   . Heart disease Father   . Hypertension Father   . Diabetes Father     Allergies: Diltiazem Current Outpatient Medications on File Prior to Visit  Medication Sig Dispense Refill  . amiodarone (PACERONE) 200 MG tablet Take 200 mg by mouth daily.     [name] Kitchen aspirin EC 81 MG tablet Take 81 mg by mouth daily.    . carvedilol (COREG) 12.5 MG tablet Take 6.25 mg by mouth 2 (two)  times daily with a meal.    . Cholecalciferol (VITAMIN D) 2000 units tablet Take 2,000 Units by mouth 2 (two) times daily.    . empagliflozin (JARDIANCE) 25 MG TABS tablet Take 25 mg by mouth daily.    . hydrALAZINE (APRESOLINE) 25 MG tablet Take 25 mg by mouth 2 (two) times daily.    . hydrocortisone 2.5 % cream Apply topically 2 (two) times daily. Right back for itching (Patient taking differently: Apply 1 application topically 2 (two) times daily as needed (itching). Right back for itching) 30 g 1  . insulin aspart protamine- aspart (NOVOLOG MIX 70/30) (70-30) 100 UNIT/ML  injection Inject 30 Units into the skin 2 (two) times daily with a meal.     . isosorbide mononitrate (IMDUR) 30 MG 24 hr tablet Take 30 mg by mouth daily.    . Liraglutide (VICTOZA [place]) Inject 1.8 Units into the skin daily.    . metFORMIN (GLUCOPHAGE) 1000 MG tablet Take 1 tablet (1,000 mg total) by mouth 2 (two) times daily with a meal.    . mupirocin ointment (BACTROBAN) 2 % Apply 1 application topically 2 (two) times daily. To right back 30 g 0  . Omega-3 Fatty Acids (FISH OIL) 1000 MG CAPS Take 2,000 mg by mouth 3 (three) times daily.    . potassium chloride SA (K-DUR,KLOR-CON) 20 MEQ tablet Take 1 tablet (20 mEq total) by mouth 2 (two) times daily. 60 tablet 3  . pravastatin (PRAVACHOL) 80 MG tablet Take 80 mg by mouth at bedtime.     [name] Kitchen PRESCRIPTION MEDICATION Inhale into the lungs at bedtime. CPAP    . sacubitril-valsartan (ENTRESTO) 24-26 MG Take 1 tablet by mouth 2 (two) times daily.    . sertraline (ZOLOFT) 100 MG tablet Take 50 mg by mouth daily.     . ticagrelor (BRILINTA) 90 MG TABS tablet Take 1 tablet (90 mg total) by mouth 2 (two) times daily. 60 tablet 3  . torsemide (DEMADEX) 20 MG tablet Take 1 tablet (20 mg total) by mouth 2 (two) times daily. 60 tablet 3   No current facility-administered medications on file prior to visit.     Social History   Tobacco Use  . Smoking status: Never Smoker  . Smokeless tobacco: Former Network engineer Use Topics  . Alcohol use: Yes    Comment: 08/02/2017 "maybe 1 drink/year"  . Drug use: Never    Review of Systems  Constitutional: Negative for chills and fever.  Respiratory: Negative for cough.   Cardiovascular: Negative for chest pain and palpitations.  Gastrointestinal: Negative for nausea and vomiting.  Musculoskeletal: Positive for arthralgias (left toe).  Skin: Positive for color change and wound.      Objective:    BP 118/74 (BP Location: Left Arm, Patient Position: Sitting, Cuff Size: Normal)   Pulse 77   Temp 98 F  (36.7 C) (Oral)   Resp 15   Ht 5\' 7"  (1.702 m)   Wt 208 lb 6 oz (94.5 kg)   SpO2 96%   BMI 32.64 kg/m    Physical Exam  Constitutional: He appears well-developed and well-nourished.  Cardiovascular: Regular rhythm and normal heart sounds.  No le edema Palpable pedal pulses  Pulmonary/Chest: Effort normal and breath sounds normal. No respiratory distress. He has no wheezes. He has no rhonchi. He has no rales.  Musculoskeletal:       Left ankle: He exhibits normal range of motion, no swelling and no ecchymosis. No tenderness.  Left foot: There is tenderness, swelling and laceration. There is normal range of motion, no bony tenderness and no deformity.       Feet:  Localized area of Erythema and swelling left great toe. Skin peeled back, deroofed. Granulation present. No purulent discharge, foul odor  Neurological: He is alert.  Sensation intake bilateral dorsal aspect of feet  Skin: Skin is warm and dry.  Psychiatric: He has a normal mood and affect. His speech is normal and behavior is normal.  Vitals reviewed.      Assessment & Plan:   Problem List Items Addressed This Visit    None    Visit Diagnoses    Skin infection    -  Primary   Relevant Medications   doxycycline (DORYX) 100 MG EC tablet   Other Relevant Orders   DG Foot Complete Left     Patient is well-appearing.  He is nontoxic in appearance.  He is diabetic however it is relatively controlled.  We will go ahead and start doxycycline empirically to cover MRSA.  Patient will return in a couple days for wound recheck to ensure improvement, and also to ensure that we do not make any changes to antibiotic at that time.   I am having Martin Bush maintain his aspirin EC, amiodarone, pravastatin, sertraline, carvedilol, Vitamin D, Fish Oil, PRESCRIPTION MEDICATION, isosorbide mononitrate, hydrALAZINE, Liraglutide (VICTOZA [place]), mupirocin ointment, hydrocortisone, insulin aspart protamine- aspart, empagliflozin,  metFORMIN, ticagrelor, torsemide, potassium chloride SA, and sacubitril-valsartan.   No orders of the defined types were placed in this encounter.   Return precautions given.   Risks, benefits, and alternatives of the medications and treatment plan prescribed today were discussed, and patient expressed understanding.   Education regarding symptom management and diagnosis given to patient on AVS.  Continue to follow with [name], MD for routine health maintenance.   Martin Bush and I agreed with plan.   Mable Paris, FNP

## 2017-10-21 ENCOUNTER — Ambulatory Visit (INDEPENDENT_AMBULATORY_CARE_PROVIDER_SITE_OTHER): Payer: Medicare Other | Admitting: Family

## 2017-10-21 ENCOUNTER — Encounter: Payer: Self-pay | Admitting: Family

## 2017-10-21 VITALS — BP 138/82 | HR 88 | Temp 97.8°F | Ht 67.0 in | Wt 208.5 lb

## 2017-10-21 DIAGNOSIS — L089 Local infection of the skin and subcutaneous tissue, unspecified: Secondary | ICD-10-CM

## 2017-10-21 NOTE — Progress Notes (Signed)
Subjective:    Patient ID: Martin Bush, male    DOB: Jun 03, 1955, 62 y.o.   MRN: [id number]  CC: Martin Bush is a 62 y.o. male who presents today for follow up.   HPI: Left great toe wound , tenderness has improved on doxycycline.  Feels well today. Pain and redness improved.  Continues to apply neosporin. No purulent discharge from wound.   No other concerns today   HISTORY:  Past Medical History:  Diagnosis Date  . AICD (automatic cardioverter/defibrillator) present 2016  . Arthritis    "thumbs"  (08/02/2017)  . CHF (congestive heart failure) ([place])   . Colon polyps   . Coronary artery disease   . Depression   . High cholesterol   . Hypertension   . MI (myocardial infarction) ([place]) 2003  . OSA on CPAP   . Pneumonia 10/2014  . Proteinuria   . Type II diabetes mellitus (HCC)    insulin dependent   Past Surgical History:  Procedure Laterality Date  . CARDIAC CATHETERIZATION N/A 07/16/2014   Procedure: Left Heart Cath and Coronary Angiography;  Surgeon: Adrian Prows, MD;  Location: [place] [facility];  Service: Cardiovascular;  Laterality: N/A;  . CARDIAC CATHETERIZATION  07/16/2014   Procedure: Coronary Balloon Angioplasty;  Surgeon: Adrian Prows, MD;  Location: [place] [facility];  Service: Cardiovascular;;  . CARDIAC CATHETERIZATION  2003  . CARDIAC DEFIBRILLATOR PLACEMENT  2016  . CATARACT EXTRACTION W/ INTRAOCULAR LENS IMPLANT Left 03/2014  . CORONARY ANGIOPLASTY WITH STENT PLACEMENT     "I've got a total of 8 stents in there; mostly doine at [facility]" (08/02/2017)  . CORONARY ARTERY BYPASS GRAFT  ~ 2003   "CABG X2"; [facility]  . CORONARY ATHERECTOMY N/A 08/16/2017   Procedure: CORONARY ATHERECTOMY;  Surgeon: Nigel Mormon, MD;  Location: [place] [facility];  Service: Cardiovascular;  Laterality: N/A;  . CORONARY BALLOON ANGIOPLASTY N/A 08/04/2017   Procedure: CORONARY BALLOON ANGIOPLASTY;  Surgeon: Adrian Prows, MD;  Location: [place] [facility];  Service:  Cardiovascular;  Laterality: N/A;  . CORONARY STENT INTERVENTION N/A 08/16/2017   Procedure: CORONARY STENT INTERVENTION;  Surgeon: Nigel Mormon, MD;  Location: [place] [facility];  Service: Cardiovascular;  Laterality: N/A;  . ELBOW SURGERY Left ?2001   "pinched nerve"  . LEFT HEART CATH AND CORS/GRAFTS ANGIOGRAPHY N/A 08/04/2017   Procedure: LEFT HEART CATH AND CORS/GRAFTS ANGIOGRAPHY;  Surgeon: Adrian Prows, MD;  Location: [place] [facility];  Service: Cardiovascular;  Laterality: N/A;  . LEFT HEART CATH AND CORS/GRAFTS ANGIOGRAPHY N/A 08/20/2017   Procedure: LEFT HEART CATH AND CORS/GRAFTS ANGIOGRAPHY;  Surgeon: Nigel Mormon, MD;  Location: [place] [facility];  Service: Cardiovascular;  Laterality: N/A;   Family History  Problem Relation Age of Onset  . Uterine cancer Mother   . Lung cancer Mother   . Hyperlipidemia Father   . Heart disease Father   . Hypertension Father   . Diabetes Father     Allergies: Diltiazem Current Outpatient Medications on File Prior to Visit  Medication Sig Dispense Refill  . amiodarone (PACERONE) 200 MG tablet Take 200 mg by mouth daily.     [name] Kitchen aspirin EC 81 MG tablet Take 81 mg by mouth daily.    . carvedilol (COREG) 12.5 MG tablet Take 6.25 mg by mouth 2 (two) times daily with a meal.    . Cholecalciferol (VITAMIN D) 2000 units tablet Take 2,000 Units by mouth 2 (two) times daily.    [name] Kitchen  doxycycline (DORYX) 100 MG EC tablet Take 1 tablet (100 mg total) by mouth 2 (two) times daily for 7 days. 14 tablet 0  . empagliflozin (JARDIANCE) 25 MG TABS tablet Take 25 mg by mouth daily.    . hydrALAZINE (APRESOLINE) 25 MG tablet Take 25 mg by mouth 2 (two) times daily.    . hydrocortisone 2.5 % cream Apply topically 2 (two) times daily. Right back for itching (Patient taking differently: Apply 1 application topically 2 (two) times daily as needed (itching). Right back for itching) 30 g 1  . insulin aspart protamine- aspart (NOVOLOG MIX 70/30) (70-30) 100  UNIT/ML injection Inject 30 Units into the skin 2 (two) times daily with a meal.     . isosorbide mononitrate (IMDUR) 30 MG 24 hr tablet Take 30 mg by mouth daily.    . Liraglutide (VICTOZA [place]) Inject 1.8 Units into the skin daily.    . metFORMIN (GLUCOPHAGE) 1000 MG tablet Take 1 tablet (1,000 mg total) by mouth 2 (two) times daily with a meal.    . mupirocin ointment (BACTROBAN) 2 % Apply 1 application topically 2 (two) times daily. To right back 30 g 0  . Omega-3 Fatty Acids (FISH OIL) 1000 MG CAPS Take 2,000 mg by mouth 3 (three) times daily.    . potassium chloride SA (K-DUR,KLOR-CON) 20 MEQ tablet Take 1 tablet (20 mEq total) by mouth 2 (two) times daily. 60 tablet 3  . pravastatin (PRAVACHOL) 80 MG tablet Take 80 mg by mouth at bedtime.     [name] Kitchen PRESCRIPTION MEDICATION Inhale into the lungs at bedtime. CPAP    . sacubitril-valsartan (ENTRESTO) 24-26 MG Take 1 tablet by mouth 2 (two) times daily.    . sertraline (ZOLOFT) 100 MG tablet Take 50 mg by mouth daily.     . ticagrelor (BRILINTA) 90 MG TABS tablet Take 1 tablet (90 mg total) by mouth 2 (two) times daily. 60 tablet 3  . torsemide (DEMADEX) 20 MG tablet Take 1 tablet (20 mg total) by mouth 2 (two) times daily. 60 tablet 3   No current facility-administered medications on file prior to visit.     Social History   Tobacco Use  . Smoking status: Never Smoker  . Smokeless tobacco: Former Network engineer Use Topics  . Alcohol use: Yes    Comment: 08/02/2017 "maybe 1 drink/year"  . Drug use: Never    Review of Systems  Constitutional: Negative for chills and fever.  Respiratory: Negative for cough.   Cardiovascular: Negative for chest pain, palpitations and leg swelling.  Gastrointestinal: Negative for nausea and vomiting.  Skin: Positive for color change and wound.      Objective:    BP 138/82 (BP Location: Left Arm, Patient Position: Sitting, Cuff Size: Normal)   Pulse 88   Temp 97.8 F (36.6 C) (Oral)   Ht 5\' 7"  (1.702  m)   Wt 208 lb 8 oz (94.6 kg)   SpO2 96%   BMI 32.66 kg/m  BP Readings from Last 3 Encounters:  10/21/17 138/82  10/19/17 118/74  09/19/17 90/60   Wt Readings from Last 3 Encounters:  10/21/17 208 lb 8 oz (94.6 kg)  10/19/17 208 lb 6 oz (94.5 kg)  09/19/17 202 lb (91.6 kg)    Physical Exam  Constitutional: He appears well-developed and well-nourished.  Cardiovascular: Regular rhythm and normal heart sounds.  Pulmonary/Chest: Effort normal and breath sounds normal. No respiratory distress. He has no wheezes. He has no rhonchi. He has  no rales.  Musculoskeletal:       Left foot: There is normal range of motion, no tenderness, no bony tenderness, no swelling and no laceration.       Feet:  Erythema has decreased. Continues to be  Localized. No purulent discharge or increased warmth appreciated.   Neurological: He is alert.  Skin: Skin is warm and dry.  Psychiatric: He has a normal mood and affect. His speech is normal and behavior is normal.  Vitals reviewed.      Assessment & Plan:   1. Skin infection Patient is well-appearing, nontoxic in appearance.  I am pleased that erythema, tenderness has improved on a couple days of doxycycline.  Advised patient to remain vigilant and certainly let me know if this does not continue.  Also advised him that in the setting of diabetes, we could see poor wound healing, he will stay vigilant for this and let me know if this occurs.   I am having Martin Bush maintain his aspirin EC, amiodarone, pravastatin, sertraline, carvedilol, Vitamin D, Fish Oil, PRESCRIPTION MEDICATION, isosorbide mononitrate, hydrALAZINE, Liraglutide (VICTOZA [place]), mupirocin ointment, hydrocortisone, insulin aspart protamine- aspart, empagliflozin, metFORMIN, ticagrelor, torsemide, potassium chloride SA, sacubitril-valsartan, and doxycycline.   No orders of the defined types were placed in this encounter.   Return precautions given.   Risks, benefits, and  alternatives of the medications and treatment plan prescribed today were discussed, and patient expressed understanding.   Education regarding symptom management and diagnosis given to patient on AVS.  Continue to follow with [name], MD for routine health maintenance.   Martin Bush and I agreed with plan.   Mable Paris, FNP

## 2017-10-21 NOTE — Patient Instructions (Signed)
Glad you are doing better  Let me know if wound doesn't continue to improve , especially in setting of diabetes.

## 2017-10-26 ENCOUNTER — Emergency Department (HOSPITAL_COMMUNITY)
Admission: EM | Admit: 2017-10-26 | Discharge: 2017-10-26 | Disposition: A | Discharge status: Home / Self Care | Payer: Medicare Other | Attending: Emergency Medicine | Admitting: Emergency Medicine

## 2017-10-26 ENCOUNTER — Emergency Department (HOSPITAL_COMMUNITY): Payer: Medicare Other

## 2017-10-26 ENCOUNTER — Encounter (HOSPITAL_COMMUNITY): Payer: Self-pay | Admitting: Emergency Medicine

## 2017-10-26 ENCOUNTER — Other Ambulatory Visit: Payer: Self-pay

## 2017-10-26 ENCOUNTER — Ambulatory Visit (INDEPENDENT_AMBULATORY_CARE_PROVIDER_SITE_OTHER): Payer: Medicare Other | Admitting: *Deleted

## 2017-10-26 VITALS — Wt 207.0 lb

## 2017-10-26 DIAGNOSIS — I252 Old myocardial infarction: Secondary | ICD-10-CM | POA: Insufficient documentation

## 2017-10-26 DIAGNOSIS — I509 Heart failure, unspecified: Secondary | ICD-10-CM | POA: Insufficient documentation

## 2017-10-26 DIAGNOSIS — Z951 Presence of aortocoronary bypass graft: Secondary | ICD-10-CM | POA: Diagnosis not present

## 2017-10-26 DIAGNOSIS — E1122 Type 2 diabetes mellitus with diabetic chronic kidney disease: Secondary | ICD-10-CM | POA: Diagnosis not present

## 2017-10-26 DIAGNOSIS — N183 Chronic kidney disease, stage 3 (moderate): Secondary | ICD-10-CM | POA: Diagnosis not present

## 2017-10-26 DIAGNOSIS — Z794 Long term (current) use of insulin: Secondary | ICD-10-CM | POA: Insufficient documentation

## 2017-10-26 DIAGNOSIS — R42 Dizziness and giddiness: Secondary | ICD-10-CM | POA: Diagnosis not present

## 2017-10-26 DIAGNOSIS — Z95810 Presence of automatic (implantable) cardiac defibrillator: Secondary | ICD-10-CM | POA: Insufficient documentation

## 2017-10-26 DIAGNOSIS — R634 Abnormal weight loss: Secondary | ICD-10-CM

## 2017-10-26 DIAGNOSIS — I2510 Atherosclerotic heart disease of native coronary artery without angina pectoris: Secondary | ICD-10-CM | POA: Insufficient documentation

## 2017-10-26 DIAGNOSIS — Z79899 Other long term (current) drug therapy: Secondary | ICD-10-CM | POA: Insufficient documentation

## 2017-10-26 DIAGNOSIS — Z7982 Long term (current) use of aspirin: Secondary | ICD-10-CM | POA: Insufficient documentation

## 2017-10-26 DIAGNOSIS — R079 Chest pain, unspecified: Secondary | ICD-10-CM | POA: Diagnosis not present

## 2017-10-26 DIAGNOSIS — I130 Hypertensive heart and chronic kidney disease with heart failure and stage 1 through stage 4 chronic kidney disease, or unspecified chronic kidney disease: Secondary | ICD-10-CM | POA: Diagnosis not present

## 2017-10-26 LAB — BASIC METABOLIC PANEL
Anion gap: 9 (ref 5–15)
BUN: 39 mg/dL — ABNORMAL HIGH (ref 8–23)
CO2: 24 mmol/L (ref 22–32)
Calcium: 9.6 mg/dL (ref 8.9–10.3)
Chloride: 104 mmol/L (ref 98–111)
Creatinine, Ser: 1.83 mg/dL — ABNORMAL HIGH (ref 0.61–1.24)
GFR calc Af Amer: 44 mL/min — ABNORMAL LOW (ref >60)
GFR calc non Af Amer: 38 mL/min — ABNORMAL LOW (ref >60)
Glucose, Bld: 122 mg/dL — ABNORMAL HIGH (ref 70–99)
Potassium: 3.8 mmol/L (ref 3.5–5.1)
Sodium: 137 mmol/L (ref 135–145)

## 2017-10-26 LAB — CBC
HCT: 41.3 % (ref 39.0–52.0)
Hemoglobin: 12.8 g/dL — ABNORMAL LOW (ref 13.0–17.0)
MCH: 29.3 pg (ref 26.0–34.0)
MCHC: 31 g/dL (ref 30.0–36.0)
MCV: 94.5 fL (ref 78.0–100.0)
Platelets: 215 10*3/uL (ref 150–400)
RBC: 4.37 MIL/uL (ref 4.22–5.81)
RDW: 13.9 % (ref 11.5–15.5)
WBC: 7.3 10*3/uL (ref 4.0–10.5)

## 2017-10-26 LAB — I-STAT TROPONIN, ED
Troponin i, poc: 0.03 ng/mL (ref 0.00–0.08)
Troponin i, poc: 0.02 ng/mL (ref 0.00–0.08)

## 2017-10-26 LAB — BRAIN NATRIURETIC PEPTIDE: B Natriuretic Peptide: 325.7 pg/mL — ABNORMAL HIGH (ref 0.0–100.0)

## 2017-10-26 MED ORDER — SODIUM CHLORIDE 0.9 % IV BOLUS
500.0000 mL | Freq: Once | INTRAVENOUS | Status: AC
  Administered 2017-10-26: 500 mL via INTRAVENOUS

## 2017-10-26 NOTE — Discharge Instructions (Addendum)
Recheck with your doctor this week, return to ER for any worsening or concerning symptoms.

## 2017-10-26 NOTE — ED Notes (Signed)
Signature pad unavailable at time of pt discharge. Pt verbalized understanding of d/c instructions. Pt denied any further requests. d

## 2017-10-26 NOTE — ED Notes (Signed)
Lab to add on BNP.  °

## 2017-10-26 NOTE — ED Provider Notes (Signed)
  Face-to-face evaluation   History: He presents for evaluation of dizziness, and concern for congestive heart failure.  Physical exam: Alert, calm, cooperative.  He is lucid.  No respiratory distress.  No dysarthria or aphasia.  Medical screening examination/treatment/procedure(s) were conducted as a shared visit with non-physician practitioner(s) and myself.  I personally evaluated the patient during the encounter    Daleen Bo, MD 10/28/17 1416

## 2017-10-26 NOTE — Progress Notes (Signed)
Patient in for nurse visit for weight check from order on 09/19/17 Patient seen 06/17/17 weight 226 lb PCP ordered recheck on weight. Recorded at 207 lb.

## 2017-10-26 NOTE — ED Notes (Signed)
EDP notified of BNP results.

## 2017-10-26 NOTE — Progress Notes (Signed)
Weight from his most recent visit with me was 202 lbs. Weight is stable. He should monitor for further weight loss.

## 2017-10-26 NOTE — ED Provider Notes (Signed)
[facility] EMERGENCY DEPARTMENT Provider Note   CSN: [id number] Arrival date & time: 10/26/17  1211     History   Chief Complaint Chief Complaint  Patient presents with  . Dizziness    HPI Martin Bush is a 62 y.o. male.  62yo male presents with complaint of feeling dizzy, described as feeling lightheaded.  Patient states this is happened 3 times today, each time lasting a few seconds.  Patient states the first episode was when he went into the bedroom this morning to help his wife make the bed, the second incident happened while he was weighing in at his doctor's office for monitoring his weight for heart failure.  Third episode occurred while he was eating breakfast at the [facility].  Patient states that he was part way through his breakfast of eggs and waffle when he suddenly felt very dizzy/lightheaded, his wife states that he was pale, he was nauseous for a few seconds.  Denies vomiting, diaphoresis, shortness of breath, chest pain.  Patient states that he no longer feels dizzy, states that he feels drunk at this time however has not had any alcohol today.  Denies changes in vision, speech, gait, arm or leg weakness.  Patient states the last time he felt dizzy like this was in July and again in August when he was in heart failure and ended up with angioplasty and stents.  Patient states that at that time however he did have chest discomfort and felt short of breath which she has not experienced with this episode.  No other complaints or concerns.     Past Medical History:  Diagnosis Date  . AICD (automatic cardioverter/defibrillator) present 2016  . Arthritis    "thumbs"  (08/02/2017)  . CHF (congestive heart failure) ([place])   . Colon polyps   . Coronary artery disease   . Depression   . High cholesterol   . Hypertension   . MI (myocardial infarction) ([place]) 2003  . OSA on CPAP   . Pneumonia 10/2014  . Proteinuria   . Type II diabetes mellitus ([facility])    insulin dependent    Patient Active Problem List   Diagnosis Date Noted  . Weight loss 09/19/2017  . Ventricular tachycardia ([place])   . ICD (implantable cardioverter-defibrillator) discharge 08/02/2017  . Obesity 06/17/2017  . CHF (congestive heart failure) ([place]) 07/23/2015  . Hyperlipidemia 07/11/2015  . Essential hypertension 07/11/2015  . CAD (coronary artery disease) 07/11/2015  . Depression 07/11/2015  . CKD (chronic kidney disease), stage III ([place]) 07/11/2015  . OSA on CPAP 07/11/2015  . Lightheadedness 07/11/2015  . Carotid artery stenosis   . S/P eye surgery 05/15/2015  . Injury of left toe 04/27/2015  . Chronic low back pain 02/13/2015  . Diabetes ([place]) 02/13/2015  . Dizziness 02/05/2015  . NSTEMI (non-ST elevated myocardial infarction) ([place]) 07/16/2014    Past Surgical History:  Procedure Laterality Date  . CARDIAC CATHETERIZATION N/A 07/16/2014   Procedure: Left Heart Cath and Coronary Angiography;  Surgeon: Adrian Prows, MD;  Location: [facility];  Service: Cardiovascular;  Laterality: N/A;  . CARDIAC CATHETERIZATION  07/16/2014   Procedure: Coronary Balloon Angioplasty;  Surgeon: Adrian Prows, MD;  Location: [facility];  Service: Cardiovascular;;  . CARDIAC CATHETERIZATION  2003  . CARDIAC DEFIBRILLATOR PLACEMENT  2016  . CATARACT EXTRACTION W/ INTRAOCULAR LENS IMPLANT Left 03/2014  . CORONARY ANGIOPLASTY WITH STENT PLACEMENT     "I've got a total of 8 stents in  there; mostly doine at [facility]" (08/02/2017)  . CORONARY ARTERY BYPASS GRAFT  ~ 2003   "CABG X2"; [facility]  . CORONARY ATHERECTOMY N/A 08/16/2017   Procedure: CORONARY ATHERECTOMY;  Surgeon: Nigel Mormon, MD;  Location: [facility];  Service: Cardiovascular;  Laterality: N/A;  . CORONARY BALLOON ANGIOPLASTY N/A 08/04/2017   Procedure: CORONARY BALLOON ANGIOPLASTY;  Surgeon: Adrian Prows, MD;  Location: [facility];  Service: Cardiovascular;  Laterality: N/A;  . CORONARY  STENT INTERVENTION N/A 08/16/2017   Procedure: CORONARY STENT INTERVENTION;  Surgeon: Nigel Mormon, MD;  Location: [facility];  Service: Cardiovascular;  Laterality: N/A;  . ELBOW SURGERY Left ?2001   "pinched nerve"  . LEFT HEART CATH AND CORS/GRAFTS ANGIOGRAPHY N/A 08/04/2017   Procedure: LEFT HEART CATH AND CORS/GRAFTS ANGIOGRAPHY;  Surgeon: Adrian Prows, MD;  Location: [facility];  Service: Cardiovascular;  Laterality: N/A;  . LEFT HEART CATH AND CORS/GRAFTS ANGIOGRAPHY N/A 08/20/2017   Procedure: LEFT HEART CATH AND CORS/GRAFTS ANGIOGRAPHY;  Surgeon: Nigel Mormon, MD;  Location: [facility];  Service: Cardiovascular;  Laterality: N/A;        Home Medications    Prior to Admission medications   Medication Sig Start Date End Date Taking? Authorizing Provider  amiodarone (PACERONE) 200 MG tablet Take 200 mg by mouth daily.  11/19/14   [provider]  aspirin EC 81 MG tablet Take 81 mg by mouth daily.    [provider]  carvedilol (COREG) 12.5 MG tablet Take 6.25 mg by mouth 2 (two) times daily with a meal.    [provider]  Cholecalciferol (VITAMIN D) 2000 units tablet Take 2,000 Units by mouth 2 (two) times daily.    [provider]  doxycycline (DORYX) 100 MG EC tablet Take 1 tablet (100 mg total) by mouth 2 (two) times daily for 7 days. 10/19/17 10/26/17  Burnard Hawthorne, FNP  empagliflozin (JARDIANCE) 25 MG TABS tablet Take 25 mg by mouth daily.    [provider]  hydrALAZINE (APRESOLINE) 25 MG tablet Take 25 mg by mouth 2 (two) times daily.    [provider]  hydrocortisone 2.5 % cream Apply topically 2 (two) times daily. Right back for itching Patient taking differently: Apply 1 application topically 2 (two) times daily as needed (itching). Right back for itching 05/27/17   McLean-Scocuzza, Nino Glow, MD  insulin aspart protamine- aspart (NOVOLOG MIX 70/30) (70-30) 100 UNIT/ML injection Inject  30 Units into the skin 2 (two) times daily with a meal.     [provider]  isosorbide mononitrate (IMDUR) 30 MG 24 hr tablet Take 30 mg by mouth daily.    [provider]  Liraglutide (VICTOZA [place]) Inject 1.8 Units into the skin daily.    [provider]  metFORMIN (GLUCOPHAGE) 1000 MG tablet Take 1 tablet (1,000 mg total) by mouth 2 (two) times daily with a meal. 08/06/17   Adrian Prows, MD  mupirocin ointment (BACTROBAN) 2 % Apply 1 application topically 2 (two) times daily. To right back 05/27/17   McLean-Scocuzza, Nino Glow, MD  Omega-3 Fatty Acids (FISH OIL) 1000 MG CAPS Take 2,000 mg by mouth 3 (three) times daily.    [provider]  potassium chloride SA (K-DUR,KLOR-CON) 20 MEQ tablet Take 1 tablet (20 mEq total) by mouth 2 (two) times daily. 09/03/17   Patwardhan, Reynold Bowen, MD  pravastatin (PRAVACHOL) 80 MG tablet Take 80 mg by mouth at  bedtime.     [provider]  PRESCRIPTION MEDICATION Inhale into the lungs at bedtime. CPAP    [provider]  sacubitril-valsartan (ENTRESTO) 24-26 MG Take 1 tablet by mouth 2 (two) times daily.    [provider]  sertraline (ZOLOFT) 100 MG tablet Take 50 mg by mouth daily.     [provider]  ticagrelor (BRILINTA) 90 MG TABS tablet Take 1 tablet (90 mg total) by mouth 2 (two) times daily. 08/05/17   Adrian Prows, MD  torsemide (DEMADEX) 20 MG tablet Take 1 tablet (20 mg total) by mouth 2 (two) times daily. 09/03/17   Patwardhan, Reynold Bowen, MD    Family History Family History  Problem Relation Age of Onset  . Uterine cancer Mother   . Lung cancer Mother   . Hyperlipidemia Father   . Heart disease Father   . Hypertension Father   . Diabetes Father     Social History Social History   Tobacco Use  . Smoking status: Never Smoker  . Smokeless tobacco: Former Network engineer Use Topics  . Alcohol use: Yes    Comment: 08/02/2017 "maybe 1 drink/year"  . Drug use: Never      Allergies   Diltiazem   Review of Systems Review of Systems  Constitutional: Negative for chills, diaphoresis and fever.  HENT: Negative for congestion.   Eyes: Negative for visual disturbance.  Respiratory: Negative for chest tightness and shortness of breath.   Cardiovascular: Negative for chest pain.  Gastrointestinal: Positive for nausea. Negative for abdominal pain, constipation, diarrhea and vomiting.  Skin: Positive for pallor. Negative for rash and wound.       Lasting a few seconds while feeling light headed, resolved   Neurological: Positive for light-headedness. Negative for facial asymmetry, speech difficulty, weakness, numbness and headaches.  Psychiatric/Behavioral: Negative for confusion.  All other systems reviewed and are negative.    Physical Exam Updated Vital Signs BP (!) 141/80   Pulse 65   Temp 97.7 F (36.5 C) (Oral)   Resp 15   SpO2 94%   Physical Exam  Constitutional: He is oriented to person, place, and time. He appears well-developed and well-nourished. No distress.  HENT:  Head: Normocephalic and atraumatic.  Eyes: Pupils are equal, round, and reactive to light. EOM are normal.  Neck: Neck supple.  Cardiovascular: Normal rate, regular rhythm, normal heart sounds and intact distal pulses.  No murmur heard. Pulmonary/Chest: Effort normal and breath sounds normal. No respiratory distress.  Abdominal: Soft. There is no tenderness.  Musculoskeletal: He exhibits no tenderness.  Neurological: He is alert and oriented to person, place, and time. He has normal strength. No cranial nerve deficit or sensory deficit. He exhibits normal muscle tone. Coordination normal. GCS eye subscore is 4. GCS verbal subscore is 5. GCS motor subscore is 6.  Skin: Skin is warm and dry. No rash noted. He is not diaphoretic. No pallor.  Psychiatric: He has a normal mood and affect. His behavior is normal.  Nursing note and vitals reviewed.    ED Treatments /  Results  Labs (all labs ordered are listed, but only abnormal results are displayed) Labs Reviewed  BASIC METABOLIC PANEL - Abnormal; Notable for the following components:      Result Value   Glucose, Bld 122 (*)    BUN 39 (*)    Creatinine, Ser 1.83 (*)    GFR calc non Af Amer 38 (*)    GFR calc Af Amer 44 (*)  All other components within normal limits  CBC - Abnormal; Notable for the following components:   Hemoglobin 12.8 (*)    All other components within normal limits  BRAIN NATRIURETIC PEPTIDE - Abnormal; Notable for the following components:   B Natriuretic Peptide 325.7 (*)    All other components within normal limits  I-STAT TROPONIN, ED  I-STAT TROPONIN, ED    EKG EKG Interpretation  Date/Time:  Wednesday October 26 2017 12:13:42 EDT Ventricular Rate:  66 PR Interval:  166 QRS Duration: 180 QT Interval:  534 QTC Calculation: 559 R Axis:   -148 Text Interpretation:  AV dual-paced rhythm Biventricular pacemaker detected Abnormal ECG since last tracing no significant change Confirmed by Daleen Bo [phone]) on 10/26/2017 3:13:33 PM   Radiology Dg Chest 2 View  Result Date: 10/26/2017 CLINICAL DATA:  Chest pain today, dizziness, history coronary coronary artery disease post stenting 2 months ago, CHF, type II diabetes mellitus, NSTEMI, essential hypertension EXAM: CHEST - 2 VIEW COMPARISON:  09/01/2017 FINDINGS: LEFT subclavian pacemaker/AICD leads project at RIGHT atrium, RIGHT ventricle and coronary sinus. Enlargement of cardiac silhouette post CABG and PTCA with stenting. Mediastinal contours and pulmonary vascularity normal. Lungs clear. No infiltrate, pleural effusion or pneumothorax. Bones unremarkable. IMPRESSION: Enlargement of cardiac silhouette post AICD, CABG and PTCA with stenting. No acute abnormalities. Electronically Signed   By: Lavonia Dana M.D.   On: 10/26/2017 12:57    Procedures Procedures (including critical care time)  Medications Ordered in  ED Medications  sodium chloride 0.9 % bolus 500 mL (500 mLs Intravenous New Bag/Given 10/26/17 1702)     Initial Impression / Assessment and Plan / ED Course  I have reviewed the triage vital signs and the nursing notes.  Pertinent labs & imaging results that were available during my care of the patient were reviewed by me and considered in my medical decision making (see chart for details).  Clinical Course as of Oct 26 1828  Wed Oct 02, 10377  [id number] 62 year old male presents with complaint of feeling lightheaded x3 episodes today.  Patient was concerned because when he had similar episodes in July and August he was in heart failure, states that he did have shortness of breath and chest pain with his previous episodes however denies any symptoms today.  Exam is unremarkable, patient does have a pacemaker defibrillator, device was interrogated with no acute or significant findings.  Orthostatic vital signs are normal.  Troponin x2 is negative, BNP is mildly elevated at 325 which is significantly lower than patient's previous values of 1000/1400.  CBC is unremarkable, BMP with slight increase in his BUN and creatinine.  Patient was given half liter of IV fluids, states he is feeling better, no further episodes while in the emergency room.  Patient is ready for discharge home and agrees to follow-up with PCP, return to ER for any concerning symptoms.   [LM]    Clinical Course User Index [LM] Tacy Learn, PA-C   Final Clinical Impressions(s) / ED Diagnoses   Final diagnoses:  Lightheadedness    ED Discharge Orders    None       Tacy Learn, PA-C 10/26/17 1830    Daleen Bo, MD 10/28/17 1415    Daleen Bo, MD 10/28/17 1416

## 2017-10-26 NOTE — ED Notes (Signed)
EDP given medtronic results.

## 2017-10-26 NOTE — ED Notes (Signed)
ED Provider at bedside. 

## 2017-10-26 NOTE — ED Triage Notes (Signed)
Pt with dizziness since 1030 this morning. He states "the last time I was dizzy I ended up with a few stents." He denies chest pain or SOB, no n/v/d. NAD at triage.

## 2017-10-27 NOTE — Progress Notes (Signed)
Letter sent My Chart

## 2017-10-28 DIAGNOSIS — I255 Ischemic cardiomyopathy: Secondary | ICD-10-CM | POA: Diagnosis not present

## 2017-10-28 DIAGNOSIS — I5043 Acute on chronic combined systolic (congestive) and diastolic (congestive) heart failure: Secondary | ICD-10-CM | POA: Diagnosis not present

## 2017-10-28 DIAGNOSIS — Z4502 Encounter for adjustment and management of automatic implantable cardiac defibrillator: Secondary | ICD-10-CM | POA: Diagnosis not present

## 2017-10-28 DIAGNOSIS — Z95810 Presence of automatic (implantable) cardiac defibrillator: Secondary | ICD-10-CM | POA: Diagnosis not present

## 2017-11-04 DIAGNOSIS — R0602 Shortness of breath: Secondary | ICD-10-CM | POA: Diagnosis not present

## 2017-11-06 DIAGNOSIS — I5043 Acute on chronic combined systolic (congestive) and diastolic (congestive) heart failure: Secondary | ICD-10-CM | POA: Diagnosis not present

## 2017-11-08 DIAGNOSIS — I25810 Atherosclerosis of coronary artery bypass graft(s) without angina pectoris: Secondary | ICD-10-CM | POA: Diagnosis not present

## 2017-11-08 DIAGNOSIS — Z95810 Presence of automatic (implantable) cardiac defibrillator: Secondary | ICD-10-CM | POA: Diagnosis not present

## 2017-11-08 DIAGNOSIS — I5043 Acute on chronic combined systolic (congestive) and diastolic (congestive) heart failure: Secondary | ICD-10-CM | POA: Diagnosis not present

## 2017-11-08 DIAGNOSIS — I2510 Atherosclerotic heart disease of native coronary artery without angina pectoris: Secondary | ICD-10-CM | POA: Diagnosis not present

## 2017-11-16 DIAGNOSIS — Z95810 Presence of automatic (implantable) cardiac defibrillator: Secondary | ICD-10-CM | POA: Diagnosis not present

## 2017-11-16 DIAGNOSIS — I5042 Chronic combined systolic (congestive) and diastolic (congestive) heart failure: Secondary | ICD-10-CM | POA: Diagnosis not present

## 2017-11-16 DIAGNOSIS — Z4502 Encounter for adjustment and management of automatic implantable cardiac defibrillator: Secondary | ICD-10-CM | POA: Diagnosis not present

## 2017-11-30 DIAGNOSIS — R0602 Shortness of breath: Secondary | ICD-10-CM | POA: Diagnosis not present

## 2017-11-30 DIAGNOSIS — I5043 Acute on chronic combined systolic (congestive) and diastolic (congestive) heart failure: Secondary | ICD-10-CM | POA: Diagnosis not present

## 2017-12-06 DIAGNOSIS — I5042 Chronic combined systolic (congestive) and diastolic (congestive) heart failure: Secondary | ICD-10-CM | POA: Diagnosis not present

## 2017-12-06 DIAGNOSIS — Z95810 Presence of automatic (implantable) cardiac defibrillator: Secondary | ICD-10-CM | POA: Diagnosis not present

## 2017-12-06 DIAGNOSIS — I2510 Atherosclerotic heart disease of native coronary artery without angina pectoris: Secondary | ICD-10-CM | POA: Diagnosis not present

## 2017-12-06 DIAGNOSIS — I25810 Atherosclerosis of coronary artery bypass graft(s) without angina pectoris: Secondary | ICD-10-CM | POA: Diagnosis not present

## 2017-12-07 DIAGNOSIS — I5043 Acute on chronic combined systolic (congestive) and diastolic (congestive) heart failure: Secondary | ICD-10-CM | POA: Diagnosis not present

## 2017-12-21 DIAGNOSIS — I5042 Chronic combined systolic (congestive) and diastolic (congestive) heart failure: Secondary | ICD-10-CM | POA: Diagnosis not present

## 2017-12-21 DIAGNOSIS — Z4502 Encounter for adjustment and management of automatic implantable cardiac defibrillator: Secondary | ICD-10-CM | POA: Diagnosis not present

## 2017-12-21 DIAGNOSIS — Z95810 Presence of automatic (implantable) cardiac defibrillator: Secondary | ICD-10-CM | POA: Diagnosis not present

## 2018-01-06 DIAGNOSIS — I5043 Acute on chronic combined systolic (congestive) and diastolic (congestive) heart failure: Secondary | ICD-10-CM | POA: Diagnosis not present

## 2018-01-07 ENCOUNTER — Other Ambulatory Visit: Payer: Self-pay

## 2018-01-07 ENCOUNTER — Encounter (HOSPITAL_COMMUNITY): Payer: Self-pay

## 2018-01-07 ENCOUNTER — Emergency Department (HOSPITAL_COMMUNITY): Payer: Medicare Other

## 2018-01-07 ENCOUNTER — Emergency Department (HOSPITAL_COMMUNITY)
Admission: EM | Admit: 2018-01-07 | Discharge: 2018-01-07 | Disposition: A | Discharge status: Home / Self Care | Payer: Medicare Other | Attending: Emergency Medicine | Admitting: Emergency Medicine

## 2018-01-07 DIAGNOSIS — I252 Old myocardial infarction: Secondary | ICD-10-CM | POA: Insufficient documentation

## 2018-01-07 DIAGNOSIS — E7800 Pure hypercholesterolemia, unspecified: Secondary | ICD-10-CM | POA: Insufficient documentation

## 2018-01-07 DIAGNOSIS — I2510 Atherosclerotic heart disease of native coronary artery without angina pectoris: Secondary | ICD-10-CM | POA: Diagnosis not present

## 2018-01-07 DIAGNOSIS — R531 Weakness: Secondary | ICD-10-CM | POA: Diagnosis present

## 2018-01-07 DIAGNOSIS — Y998 Other external cause status: Secondary | ICD-10-CM | POA: Diagnosis not present

## 2018-01-07 DIAGNOSIS — Y9389 Activity, other specified: Secondary | ICD-10-CM | POA: Diagnosis not present

## 2018-01-07 DIAGNOSIS — E119 Type 2 diabetes mellitus without complications: Secondary | ICD-10-CM | POA: Insufficient documentation

## 2018-01-07 DIAGNOSIS — Z7982 Long term (current) use of aspirin: Secondary | ICD-10-CM | POA: Insufficient documentation

## 2018-01-07 DIAGNOSIS — W010XXA Fall on same level from slipping, tripping and stumbling without subsequent striking against object, initial encounter: Secondary | ICD-10-CM | POA: Diagnosis not present

## 2018-01-07 DIAGNOSIS — Z79899 Other long term (current) drug therapy: Secondary | ICD-10-CM | POA: Insufficient documentation

## 2018-01-07 DIAGNOSIS — I130 Hypertensive heart and chronic kidney disease with heart failure and stage 1 through stage 4 chronic kidney disease, or unspecified chronic kidney disease: Secondary | ICD-10-CM | POA: Insufficient documentation

## 2018-01-07 DIAGNOSIS — W19XXXA Unspecified fall, initial encounter: Secondary | ICD-10-CM

## 2018-01-07 DIAGNOSIS — I509 Heart failure, unspecified: Secondary | ICD-10-CM | POA: Diagnosis not present

## 2018-01-07 DIAGNOSIS — Z95810 Presence of automatic (implantable) cardiac defibrillator: Secondary | ICD-10-CM | POA: Insufficient documentation

## 2018-01-07 DIAGNOSIS — R5383 Other fatigue: Secondary | ICD-10-CM | POA: Diagnosis not present

## 2018-01-07 DIAGNOSIS — Y9289 Other specified places as the place of occurrence of the external cause: Secondary | ICD-10-CM | POA: Insufficient documentation

## 2018-01-07 DIAGNOSIS — N183 Chronic kidney disease, stage 3 (moderate): Secondary | ICD-10-CM | POA: Insufficient documentation

## 2018-01-07 DIAGNOSIS — R1110 Vomiting, unspecified: Secondary | ICD-10-CM | POA: Diagnosis not present

## 2018-01-07 DIAGNOSIS — S0990XA Unspecified injury of head, initial encounter: Secondary | ICD-10-CM | POA: Diagnosis not present

## 2018-01-07 DIAGNOSIS — Z794 Long term (current) use of insulin: Secondary | ICD-10-CM | POA: Diagnosis not present

## 2018-01-07 DIAGNOSIS — S199XXA Unspecified injury of neck, initial encounter: Secondary | ICD-10-CM | POA: Diagnosis not present

## 2018-01-07 DIAGNOSIS — E876 Hypokalemia: Secondary | ICD-10-CM

## 2018-01-07 LAB — BASIC METABOLIC PANEL
Anion gap: 13 (ref 5–15)
BUN: 32 mg/dL — ABNORMAL HIGH (ref 8–23)
CO2: 30 mmol/L (ref 22–32)
Calcium: 8.6 mg/dL — ABNORMAL LOW (ref 8.9–10.3)
Chloride: 88 mmol/L — ABNORMAL LOW (ref 98–111)
Creatinine, Ser: 1.71 mg/dL — ABNORMAL HIGH (ref 0.61–1.24)
GFR calc Af Amer: 49 mL/min — ABNORMAL LOW (ref >60)
GFR calc non Af Amer: 42 mL/min — ABNORMAL LOW (ref >60)
Glucose, Bld: 284 mg/dL — ABNORMAL HIGH (ref 70–99)
Potassium: 3.4 mmol/L — ABNORMAL LOW (ref 3.5–5.1)
Sodium: 131 mmol/L — ABNORMAL LOW (ref 135–145)

## 2018-01-07 LAB — I-STAT CHEM 8, ED
BUN: 35 mg/dL — ABNORMAL HIGH (ref 8–23)
Calcium, Ion: 1.01 mmol/L — ABNORMAL LOW (ref 1.15–1.40)
Chloride: 88 mmol/L — ABNORMAL LOW (ref 98–111)
Creatinine, Ser: 1.7 mg/dL — ABNORMAL HIGH (ref 0.61–1.24)
Glucose, Bld: 330 mg/dL — ABNORMAL HIGH (ref 70–99)
HCT: 47 % (ref 39.0–52.0)
Hemoglobin: 16 g/dL (ref 13.0–17.0)
Potassium: 2.7 mmol/L — CL (ref 3.5–5.1)
Sodium: 132 mmol/L — ABNORMAL LOW (ref 135–145)
TCO2: 30 mmol/L (ref 22–32)

## 2018-01-07 LAB — MAGNESIUM: Magnesium: 2.1 mg/dL (ref 1.7–2.4)

## 2018-01-07 LAB — CBG MONITORING, ED: Glucose-Capillary: 281 mg/dL — ABNORMAL HIGH (ref 70–99)

## 2018-01-07 LAB — I-STAT TROPONIN, ED: Troponin i, poc: 0.04 ng/mL (ref 0.00–0.08)

## 2018-01-07 MED ORDER — POTASSIUM CHLORIDE CRYS ER 20 MEQ PO TBCR
40.0000 meq | EXTENDED_RELEASE_TABLET | Freq: Once | ORAL | Status: AC
  Administered 2018-01-07: 40 meq via ORAL
  Filled 2018-01-07: qty 2

## 2018-01-07 MED ORDER — POTASSIUM CHLORIDE 10 MEQ/100ML IV SOLN
10.0000 meq | Freq: Once | INTRAVENOUS | Status: AC
  Administered 2018-01-07: 10 meq via INTRAVENOUS
  Filled 2018-01-07: qty 100

## 2018-01-07 MED ORDER — SODIUM CHLORIDE 0.9 % IV BOLUS
250.0000 mL | Freq: Once | INTRAVENOUS | Status: AC
  Administered 2018-01-07: 250 mL via INTRAVENOUS

## 2018-01-07 MED ORDER — ONDANSETRON HCL 4 MG/2ML IJ SOLN
4.0000 mg | Freq: Once | INTRAMUSCULAR | Status: AC
  Administered 2018-01-07: 4 mg via INTRAVENOUS
  Filled 2018-01-07: qty 2

## 2018-01-07 MED ORDER — POTASSIUM CHLORIDE CRYS ER 20 MEQ PO TBCR: 20.0000 meq | EXTENDED_RELEASE_TABLET | Freq: Two times a day (BID) | ORAL | 3 refills | Status: DC

## 2018-01-07 NOTE — ED Triage Notes (Signed)
Pt states fall yesterday. Pt states he vomited this morning, reports feeling more lethargic. AOX4 in triage.

## 2018-01-07 NOTE — ED Provider Notes (Signed)
[facility] EMERGENCY DEPARTMENT Provider Note   CSN: [id number] Arrival date & time: 01/07/18  1647     History   Chief Complaint Chief Complaint  Patient presents with  . Fall    HPI Martin Bush is a 62 y.o. male.  Patient is a 62 year old male with past medical history significant for CHF, CAD presenting for fall yesterday and feeling generally weak today.  Patient states that he felt fluid overloaded in the most recent days so increased his diuretic, yesterday fell backwards while taking out the dog.  Wife states that patient just has been laying around today and generally feeling weak.  Patient denies chest pain or shortness of breath, nausea or vomiting.  The history is provided by the patient and the spouse. No language interpreter was used.    Past Medical History:  Diagnosis Date  . AICD (automatic cardioverter/defibrillator) present 2016  . Arthritis    "thumbs"  (08/02/2017)  . CHF (congestive heart failure) ([place])   . Colon polyps   . Coronary artery disease   . Depression   . High cholesterol   . Hypertension   . MI (myocardial infarction) ([place]) 2003  . OSA on CPAP   . Pneumonia 10/2014  . Proteinuria   . Type II diabetes mellitus (H[facility])    insulin dependent    Patient Active Problem List   Diagnosis Date Noted  . Weight loss 09/19/2017  . Ventricular tachycardia ([place])   . ICD (implantable cardioverter-defibrillator) discharge 08/02/2017  . Obesity 06/17/2017  . CHF (congestive heart failure) ([place]) 07/23/2015  . Hyperlipidemia 07/11/2015  . Essential hypertension 07/11/2015  . CAD (coronary artery disease) 07/11/2015  . Depression 07/11/2015  . CKD (chronic kidney disease), stage III ([place]) 07/11/2015  . OSA on CPAP 07/11/2015  . Lightheadedness 07/11/2015  . Carotid artery stenosis   . S/P eye surgery 05/15/2015  . Injury of left toe 04/27/2015  . Chronic low back pain 02/13/2015  . Diabetes ([place]) 02/13/2015  . Dizziness  02/05/2015  . NSTEMI (non-ST elevated myocardial infarction) ([place]) 07/16/2014    Past Surgical History:  Procedure Laterality Date  . CARDIAC CATHETERIZATION N/A 07/16/2014   Procedure: Left Heart Cath and Coronary Angiography;  Surgeon: Adrian Prows, MD;  Location: [facility];  Service: Cardiovascular;  Laterality: N/A;  . CARDIAC CATHETERIZATION  07/16/2014   Procedure: Coronary Balloon Angioplasty;  Surgeon: Adrian Prows, MD;  Location: [facility];  Service: Cardiovascular;;  . CARDIAC CATHETERIZATION  2003  . CARDIAC DEFIBRILLATOR PLACEMENT  2016  . CATARACT EXTRACTION W/ INTRAOCULAR LENS IMPLANT Left 03/2014  . CORONARY ANGIOPLASTY WITH STENT PLACEMENT     "I've got a total of 8 stents in there; mostly doine at [facility]" (08/02/2017)  . CORONARY ARTERY BYPASS GRAFT  ~ 2003   "CABG X2"; [facility]  . CORONARY ATHERECTOMY N/A 08/16/2017   Procedure: CORONARY ATHERECTOMY;  Surgeon: Nigel Mormon, MD;  Location: [facility];  Service: Cardiovascular;  Laterality: N/A;  . CORONARY BALLOON ANGIOPLASTY N/A 08/04/2017   Procedure: CORONARY BALLOON ANGIOPLASTY;  Surgeon: Adrian Prows, MD;  Location: [facility];  Service: Cardiovascular;  Laterality: N/A;  . CORONARY STENT INTERVENTION N/A 08/16/2017   Procedure: CORONARY STENT INTERVENTION;  Surgeon: Nigel Mormon, MD;  Location: [facility];  Service: Cardiovascular;  Laterality: N/A;  . ELBOW SURGERY Left ?2001   "pinched nerve"  . LEFT HEART CATH AND CORS/GRAFTS ANGIOGRAPHY N/A 08/04/2017  Procedure: LEFT HEART CATH AND CORS/GRAFTS ANGIOGRAPHY;  Surgeon: Adrian Prows, MD;  Location: [facility];  Service: Cardiovascular;  Laterality: N/A;  . LEFT HEART CATH AND CORS/GRAFTS ANGIOGRAPHY N/A 08/20/2017   Procedure: LEFT HEART CATH AND CORS/GRAFTS ANGIOGRAPHY;  Surgeon: Nigel Mormon, MD;  Location: [facility];  Service: Cardiovascular;  Laterality: N/A;        Home  Medications    Prior to Admission medications   Medication Sig Start Date End Date Taking? Authorizing Provider  amiodarone (PACERONE) 200 MG tablet Take 200 mg by mouth daily.  11/19/14  Yes [provider]  aspirin EC 81 MG tablet Take 81 mg by mouth daily.   Yes [provider]  carvedilol (COREG) 12.5 MG tablet Take 6.25 mg by mouth 2 (two) times daily with a meal.   Yes [provider]  Cholecalciferol (VITAMIN D) 2000 units tablet Take 2,000 Units by mouth 2 (two) times daily.   Yes [provider]  empagliflozin (JARDIANCE) 25 MG TABS tablet Take 25 mg by mouth daily.   Yes [provider]  hydrALAZINE (APRESOLINE) 25 MG tablet Take 25 mg by mouth 2 (two) times daily.   Yes [provider]  hydrocortisone 2.5 % cream Apply topically 2 (two) times daily. Right back for itching Patient taking differently: Apply 1 application topically 2 (two) times daily as needed (itching). Right back for itching 05/27/17  Yes McLean-Scocuzza, Nino Glow, MD  insulin aspart protamine - aspart (NOVOLOG MIX 70/30 FLEXPEN) (70-30) 100 UNIT/ML FlexPen Inject 60 Units into the skin 2 (two) times daily as needed (CBG >150).   Yes [provider]  isosorbide mononitrate (IMDUR) 30 MG 24 hr tablet Take 30 mg by mouth daily.   Yes [provider]  liraglutide (VICTOZA) 18 MG/3ML SOPN Inject 1.8 mg into the skin daily.   Yes [provider]  Meloxicam (MOBIC PO) Take 1 tablet by mouth daily as needed (severe back pain).   Yes [provider]  metFORMIN (GLUCOPHAGE) 1000 MG tablet Take 1 tablet (1,000 mg total) by mouth 2 (two) times daily with a meal. 08/06/17  Yes Adrian Prows, MD  Omega-3 Fatty Acids (FISH OIL) 1000 MG CAPS Take 2,000 mg by mouth 3 (three) times daily.   Yes [provider]  pravastatin (PRAVACHOL) 80 MG tablet Take 80 mg by mouth at bedtime.    Yes [provider]  PRESCRIPTION MEDICATION Inhale into  the lungs at bedtime. CPAP   Yes [provider]  PRESCRIPTION MEDICATION Take 1 tablet by mouth daily as needed (for weight gain of more than 2 lbs in one day).   Yes [provider]  sertraline (ZOLOFT) 100 MG tablet Take 50 mg by mouth at bedtime.    Yes [provider]  ticagrelor (BRILINTA) 90 MG TABS tablet Take 1 tablet (90 mg total) by mouth 2 (two) times daily. 08/05/17  Yes Adrian Prows, MD  torsemide (DEMADEX) 20 MG tablet Take 1 tablet (20 mg total) by mouth 2 (two) times daily. 09/03/17  Yes Patwardhan, Manish J, MD  mupirocin ointment (BACTROBAN) 2 % Apply 1 application topically 2 (two) times daily. To right back Patient not taking: Reported on 01/07/2018 05/27/17   McLean-Scocuzza, Nino Glow, MD  potassium chloride SA (K-DUR,KLOR-CON) 20 MEQ tablet Take 1 tablet (20 mEq total) by mouth 2 (two) times daily. 01/07/18   Erskine Squibb, MD    Family History Family History  Problem Relation  Age of Onset  . Uterine cancer Mother   . Lung cancer Mother   . Hyperlipidemia Father   . Heart disease Father   . Hypertension Father   . Diabetes Father     Social History Social History   Tobacco Use  . Smoking status: Never Smoker  . Smokeless tobacco: Former Network engineer Use Topics  . Alcohol use: Yes    Comment: 08/02/2017 "maybe 1 drink/year"  . Drug use: Never     Allergies   Diltiazem   Review of Systems Review of Systems  Constitutional: Positive for activity change and fatigue. Negative for chills and fever.  HENT: Negative for ear pain and sore throat.   Eyes: Negative for pain and visual disturbance.  Respiratory: Negative for cough and shortness of breath.   Cardiovascular: Negative for chest pain and palpitations.  Gastrointestinal: Negative for abdominal pain and vomiting.  Genitourinary: Negative for dysuria and hematuria.  Musculoskeletal: Negative for arthralgias and back pain.  Skin: Negative for color change and rash.    Neurological: Positive for syncope and headaches. Negative for seizures.  All other systems reviewed and are negative.    Physical Exam Updated Vital Signs BP 111/65   Pulse 63   Temp 97.8 F (36.6 C) (Oral)   Resp 14   SpO2 98%   Physical Exam Vitals signs and nursing note reviewed.  Constitutional:      Appearance: He is well-developed.  HENT:     Head: Normocephalic and atraumatic.  Eyes:     Conjunctiva/sclera: Conjunctivae normal.  Neck:     Musculoskeletal: Neck supple.  Cardiovascular:     Rate and Rhythm: Normal rate and regular rhythm.     Heart sounds: No murmur.  Pulmonary:     Effort: Pulmonary effort is normal. No respiratory distress.     Breath sounds: Normal breath sounds.  Abdominal:     Palpations: Abdomen is soft.     Tenderness: There is no abdominal tenderness.  Skin:    General: Skin is warm and dry.  Neurological:     General: No focal deficit present.     Mental Status: He is alert and oriented to person, place, and time.      ED Treatments / Results  Labs (all labs ordered are listed, but only abnormal results are displayed) Labs Reviewed  BASIC METABOLIC PANEL - Abnormal; Notable for the following components:      Result Value   Sodium 131 (*)    Potassium 3.4 (*)    Chloride 88 (*)    Glucose, Bld 284 (*)    BUN 32 (*)    Creatinine, Ser 1.71 (*)    Calcium 8.6 (*)    GFR calc non Af Amer 42 (*)    GFR calc Af Amer 49 (*)    All other components within normal limits  I-STAT CHEM 8, ED - Abnormal; Notable for the following components:   Sodium 132 (*)    Potassium 2.7 (*)    Chloride 88 (*)    BUN 35 (*)    Creatinine, Ser 1.70 (*)    Glucose, Bld 330 (*)    Calcium, Ion 1.01 (*)    All other components within normal limits  CBG MONITORING, ED - Abnormal; Notable for the following components:   Glucose-Capillary 281 (*)    All other components within normal limits  MAGNESIUM  I-STAT TROPONIN, ED    EKG EKG  Interpretation  Date/Time:  Saturday January 07 2018 17:35:59 EST Ventricular Rate:  67 PR Interval:    QRS Duration: 181 QT Interval:  612 QTC Calculation: 647 R Axis:   -124 Text Interpretation:  Sinus rhythm QT prolonged Nonspecific intraventricular conduction delay Abnrm T, consider ischemia, anterolateral lds Pacemaker Reconfirmed by Elnora Morrison [phone]) on 01/07/2018 6:27:19 PM   Radiology Ct Head Wo Contrast  Result Date: 01/07/2018 CLINICAL DATA:  Fall while on anti coagulation. Vomiting. EXAM: CT HEAD WITHOUT CONTRAST CT CERVICAL SPINE WITHOUT CONTRAST TECHNIQUE: Multidetector CT imaging of the head and cervical spine was performed following the standard protocol without intravenous contrast. Multiplanar CT image reconstructions of the cervical spine were also generated. COMPARISON:  CTA neck 09/10/2016 Head CT 05/15/2016 FINDINGS: CT HEAD FINDINGS Brain: There is no mass, hemorrhage or extra-axial collection. The size and configuration of the ventricles and extra-axial CSF spaces are normal. The brain parenchyma is normal, without evidence of acute or chronic infarction. Vascular: Atherosclerotic calcification of the internal carotid arteries at the skull base. No abnormal hyperdensity of the major intracranial arteries or dural venous sinuses. Skull: The visualized skull base, calvarium and extracranial soft tissues are normal. Sinuses/Orbits: No fluid levels or advanced mucosal thickening of the visualized paranasal sinuses. No mastoid or middle ear effusion. The orbits are normal. CT CERVICAL SPINE FINDINGS Alignment: No static subluxation. Facets are aligned. Occipital condyles are normally positioned. Skull base and vertebrae: No acute fracture. Soft tissues and spinal canal: No prevertebral fluid or swelling. No visible canal hematoma. Disc levels: No advanced spinal canal or neural foraminal stenosis. Upper chest: No pneumothorax, pulmonary nodule or pleural effusion. Other:  Normal visualized paraspinal cervical soft tissues. IMPRESSION: 1. No acute intracranial abnormality. 2. No acute fracture or static subluxation of the cervical spine. Electronically Signed   By: Ulyses Jarred M.D.   On: 01/07/2018 17:46   Ct Cervical Spine Wo Contrast  Result Date: 01/07/2018 CLINICAL DATA:  Fall while on anti coagulation. Vomiting. EXAM: CT HEAD WITHOUT CONTRAST CT CERVICAL SPINE WITHOUT CONTRAST TECHNIQUE: Multidetector CT imaging of the head and cervical spine was performed following the standard protocol without intravenous contrast. Multiplanar CT image reconstructions of the cervical spine were also generated. COMPARISON:  CTA neck 09/10/2016 Head CT 05/15/2016 FINDINGS: CT HEAD FINDINGS Brain: There is no mass, hemorrhage or extra-axial collection. The size and configuration of the ventricles and extra-axial CSF spaces are normal. The brain parenchyma is normal, without evidence of acute or chronic infarction. Vascular: Atherosclerotic calcification of the internal carotid arteries at the skull base. No abnormal hyperdensity of the major intracranial arteries or dural venous sinuses. Skull: The visualized skull base, calvarium and extracranial soft tissues are normal. Sinuses/Orbits: No fluid levels or advanced mucosal thickening of the visualized paranasal sinuses. No mastoid or middle ear effusion. The orbits are normal. CT CERVICAL SPINE FINDINGS Alignment: No static subluxation. Facets are aligned. Occipital condyles are normally positioned. Skull base and vertebrae: No acute fracture. Soft tissues and spinal canal: No prevertebral fluid or swelling. No visible canal hematoma. Disc levels: No advanced spinal canal or neural foraminal stenosis. Upper chest: No pneumothorax, pulmonary nodule or pleural effusion. Other: Normal visualized paraspinal cervical soft tissues. IMPRESSION: 1. No acute intracranial abnormality. 2. No acute fracture or static subluxation of the cervical spine.  Electronically Signed   By: Ulyses Jarred M.D.   On: 01/07/2018 17:46    Procedures Procedures (including critical care time)  Medications Ordered in ED Medications  ondansetron (ZOFRAN) injection 4 mg (4 mg Intravenous Given  01/07/18 1743)  potassium chloride 10 mEq in 100 mL IVPB (0 mEq Intravenous Stopped 01/07/18 1916)  potassium chloride SA (K-DUR,KLOR-CON) CR tablet 40 mEq (40 mEq Oral Given 01/07/18 1806)  sodium chloride 0.9 % bolus 250 mL (0 mLs Intravenous Stopped 01/07/18 1923)  potassium chloride 10 mEq in 100 mL IVPB (0 mEq Intravenous Stopped 01/07/18 2109)     Initial Impression / Assessment and Plan / ED Course  I have reviewed the triage vital signs and the nursing notes.  Pertinent labs & imaging results that were available during my care of the patient were reviewed by me and considered in my medical decision making (see chart for details).    Patient is a 62 year old male with past medical history significant for CHF presenting for generalized weakness and episode of syncope yesterday.  Patient is chest pain-free at this time.  EKG was reviewed by myself and my attending physician-normal sinus rhythm, QTC prolonged at 649.  Labs remarkable for potassium of 2.7, of note patient has been increasing his diuretic in the last few days because he is felt more bloated than usual.  Orthostatics were positive.  Patient was given IV potassium, p.o. potassium and 250 cc bolus of fluids with encouragement for good p.o. intake. CT head and neck were unremarkable.  Initial troponin negative. Repeat labs show normal Mg, K 3.4. Patient instructed to increase home potassium supplementation to BID instead of qd. Admission was offered, discussed with wife and patient at bedside.  Patient would prefer to go home as he feels much better.  Strict return precautions were discussed.  Wife stated that she would look after him and make sure that he does not get up on his accompanied.  Patient  counseled to take time to add up from sitting.  Patient voiced understanding strict return precautions were discussed.  Patient will follow up with primary care doctor on Monday for potassium recheck and symptomatic control.  Final Clinical Impressions(s) / ED Diagnoses   Final diagnoses:  Fall, initial encounter  Fatigue, unspecified type  Hypokalemia    ED Discharge Orders         Ordered    potassium chloride SA (K-DUR,KLOR-CON) 20 MEQ tablet  2 times daily     01/07/18 2042           Erskine Squibb, MD 01/07/18 2210    Elnora Morrison, MD 01/08/18 [id number]

## 2018-01-07 NOTE — ED Notes (Signed)
Pt CBG was 281, notified Sarah(RN)

## 2018-01-07 NOTE — ED Notes (Signed)
Patient's SpO2 dropped to 83% RA while laying flat for CT - RN went over and placed him on 2L O2 nasal cannula - increased to 94%.

## 2018-01-07 NOTE — ED Notes (Signed)
[employer] called to report ICD interrogation - no abnormal events recorded at time of near-syncope yesterday or since. MD aware.

## 2018-01-07 NOTE — ED Notes (Signed)
Patient verbalizes understanding of discharge instructions. Opportunity for questioning and answers were provided. Armband removed by staff, pt discharged from ED.  

## 2018-01-07 NOTE — ED Notes (Signed)
Interrogated pacemaker 

## 2018-01-16 DIAGNOSIS — I25810 Atherosclerosis of coronary artery bypass graft(s) without angina pectoris: Secondary | ICD-10-CM | POA: Diagnosis not present

## 2018-01-16 DIAGNOSIS — I255 Ischemic cardiomyopathy: Secondary | ICD-10-CM | POA: Diagnosis not present

## 2018-01-16 DIAGNOSIS — I2510 Atherosclerotic heart disease of native coronary artery without angina pectoris: Secondary | ICD-10-CM | POA: Diagnosis not present

## 2018-01-16 DIAGNOSIS — I5042 Chronic combined systolic (congestive) and diastolic (congestive) heart failure: Secondary | ICD-10-CM | POA: Diagnosis not present

## 2018-01-17 ENCOUNTER — Ambulatory Visit: Payer: Medicare Other | Admitting: Family Medicine

## 2018-01-19 ENCOUNTER — Ambulatory Visit (INDEPENDENT_AMBULATORY_CARE_PROVIDER_SITE_OTHER): Payer: Medicare Other | Admitting: Family Medicine

## 2018-01-19 ENCOUNTER — Encounter: Payer: Self-pay | Admitting: Family Medicine

## 2018-01-19 VITALS — BP 108/60 | HR 80 | Temp 98.1°F | Ht 67.0 in | Wt 210.4 lb

## 2018-01-19 DIAGNOSIS — E1165 Type 2 diabetes mellitus with hyperglycemia: Secondary | ICD-10-CM

## 2018-01-19 DIAGNOSIS — Z794 Long term (current) use of insulin: Secondary | ICD-10-CM | POA: Diagnosis not present

## 2018-01-19 DIAGNOSIS — R42 Dizziness and giddiness: Secondary | ICD-10-CM

## 2018-01-19 DIAGNOSIS — F325 Major depressive disorder, single episode, in full remission: Secondary | ICD-10-CM | POA: Diagnosis not present

## 2018-01-19 LAB — POCT GLYCOSYLATED HEMOGLOBIN (HGB A1C): Hemoglobin A1C: 11.4 % — AB (ref 4.0–5.6)

## 2018-01-19 NOTE — Progress Notes (Signed)
Tommi Rumps, MD Phone: [phone]  Martin Bush is a 62 y.o. male who presents today for f/u.  CC: dm, light headed, depression  Diabetes: Typically running 150-300s.  Taking Jardiance, Victoza, metformin, and NovoLog 70/30.  He takes the NovoLog 60 units 1-2 times a day.  He will skip the second dose depending on what his sugars are running.  No polyuria or polydipsia.  No hypoglycemia.  Notes he had an ophthalmology visit for laser surgery on Monday.  He does follow with endocrinology at the [place].  Lightheadedness: Patient was evaluated in the emergency department on 10/26/2017 for lightheadedness.  He had several occurrences of lightheadedness.  Evaluation was remarkable for AKI.  He was given half a liter of fluid and his symptoms resolved.  He has not had recurrence of the lightheadedness.  He had an episode where he fell on 01/07/2018.  He felt as though his legs just gave out on him.  He notes he felt presyncopal though had no syncope.  He was orthostatic during that visit.  He had evaluation in the emergency room revealing hypokalemia and elevated creatinine with elevated glucose and low sodium.  He had his potassium repleted and was given IV fluids.  His potassium improved on recheck.  He had been taking extra torsemide and had not increased his potassium supplementation.  He notes no chest pain, shortness of breath, or palpitations.  He has not had any additional lightheadedness or falls.  He has followed up with cardiology about a week ago regarding this.  He is due to have lab work through cardiology in the next week or so.  He continues on his cardiac medications through his cardiologist.  Depression: Patient notes no depressive symptoms.  He continues on Zoloft.  Social History   Tobacco Use  Smoking Status Never Smoker  Smokeless Tobacco Former User     ROS see history of present illness  Objective  Physical Exam Vitals:   01/19/18 1051  BP: 108/60  Pulse: 80  Temp:  98.1 F (36.7 C)  SpO2: 95%    BP Readings from Last 3 Encounters:  01/19/18 108/60  01/07/18 111/65  10/26/17 (!) 144/78   Wt Readings from Last 3 Encounters:  01/19/18 210 lb 6.4 oz (95.4 kg)  10/26/17 207 lb (93.9 kg)  10/21/17 208 lb 8 oz (94.6 kg)    Physical Exam Constitutional:      General: He is not in acute distress.    Appearance: He is not diaphoretic.  Cardiovascular:     Rate and Rhythm: Normal rate and regular rhythm.     Heart sounds: Normal heart sounds.  Pulmonary:     Effort: Pulmonary effort is normal.     Breath sounds: Normal breath sounds.  Musculoskeletal:     Right lower leg: No edema.     Left lower leg: No edema.  Skin:    General: Skin is warm and dry.  Neurological:     Mental Status: He is alert.      Assessment/Plan: Please see individual problem list.  Diabetes ([place]) Significantly varying control.  A1c is very uncontrolled.  We will have him see our clinical pharmacist for medication management.  He will schedule an appointment to see his endocrinologist at the [facility]. we will request endocrinology records.  Lightheadedness Patient with positive orthostatics on his second visit to the ED.  I suspect he was slightly dehydrated.  He has not had any recurrence or other symptoms.  He will have labs  done by cardiology.  We will request their office visit note and determine if there are any additional labs that we would like to be done through [facility].  If he has recurrence of symptoms he will be reevaluated.  Depression Asymptomatic.  He will continue on Zoloft.   Orders Placed This Encounter  Procedures  . POCT HgB A1C    No orders of the defined types were placed in this encounter.    Tommi Rumps, MD [place]

## 2018-01-19 NOTE — Assessment & Plan Note (Signed)
Asymptomatic.  He will continue on Zoloft.

## 2018-01-19 NOTE — Assessment & Plan Note (Signed)
Patient with positive orthostatics on his second visit to the ED.  I suspect he was slightly dehydrated.  He has not had any recurrence or other symptoms.  He will have labs done by cardiology.  We will request their office visit note and determine if there are any additional labs that we would like to be done through [facility].  If he has recurrence of symptoms he will be reevaluated.

## 2018-01-19 NOTE — Patient Instructions (Signed)
Nice to see you. Please have your labs done through cardiology. Please follow-up with your endocrinologist at the [facility]. If you have recurrent lightheadedness please let us her cardiologist know.  If you feel as though you are going to pass out or you do pass out you need to be evaluated immediately.

## 2018-01-19 NOTE — Assessment & Plan Note (Addendum)
Significantly varying control.  A1c is very uncontrolled.  We will have him see our clinical pharmacist for medication management.  He will schedule an appointment to see his endocrinologist at the [facility]. we will request endocrinology records.

## 2018-01-21 DIAGNOSIS — I5042 Chronic combined systolic (congestive) and diastolic (congestive) heart failure: Secondary | ICD-10-CM | POA: Diagnosis not present

## 2018-01-21 DIAGNOSIS — Z4502 Encounter for adjustment and management of automatic implantable cardiac defibrillator: Secondary | ICD-10-CM | POA: Diagnosis not present

## 2018-01-21 DIAGNOSIS — Z95810 Presence of automatic (implantable) cardiac defibrillator: Secondary | ICD-10-CM | POA: Diagnosis not present

## 2018-01-26 DIAGNOSIS — I255 Ischemic cardiomyopathy: Secondary | ICD-10-CM | POA: Diagnosis not present

## 2018-01-27 DIAGNOSIS — I5042 Chronic combined systolic (congestive) and diastolic (congestive) heart failure: Secondary | ICD-10-CM | POA: Diagnosis not present

## 2018-01-27 DIAGNOSIS — Z95810 Presence of automatic (implantable) cardiac defibrillator: Secondary | ICD-10-CM | POA: Diagnosis not present

## 2018-01-27 DIAGNOSIS — I255 Ischemic cardiomyopathy: Secondary | ICD-10-CM | POA: Diagnosis not present

## 2018-01-27 DIAGNOSIS — I25810 Atherosclerosis of coronary artery bypass graft(s) without angina pectoris: Secondary | ICD-10-CM | POA: Diagnosis not present

## 2018-02-06 ENCOUNTER — Ambulatory Visit: Payer: Medicare Other | Admitting: Pharmacist

## 2018-02-06 DIAGNOSIS — I5043 Acute on chronic combined systolic (congestive) and diastolic (congestive) heart failure: Secondary | ICD-10-CM | POA: Diagnosis not present

## 2018-02-06 DIAGNOSIS — I10 Essential (primary) hypertension: Secondary | ICD-10-CM | POA: Diagnosis not present

## 2018-02-14 DIAGNOSIS — I255 Ischemic cardiomyopathy: Secondary | ICD-10-CM | POA: Diagnosis not present

## 2018-02-14 DIAGNOSIS — I5042 Chronic combined systolic (congestive) and diastolic (congestive) heart failure: Secondary | ICD-10-CM | POA: Diagnosis not present

## 2018-02-14 DIAGNOSIS — I25810 Atherosclerosis of coronary artery bypass graft(s) without angina pectoris: Secondary | ICD-10-CM | POA: Diagnosis not present

## 2018-02-14 DIAGNOSIS — I2510 Atherosclerotic heart disease of native coronary artery without angina pectoris: Secondary | ICD-10-CM | POA: Diagnosis not present

## 2018-02-27 ENCOUNTER — Telehealth: Payer: Self-pay

## 2018-02-27 NOTE — Telephone Encounter (Signed)
Martin Bush -spouse called in stating that they cannot afford Entresto. Pharmacy informed it will be $142 month. Will work on patient asst. Also pt is having headaches x 2 days with taking excedrin migraine w/ no relief. BP has been 104/64. Wants to know if could be side effect from Entresto.//ah

## 2018-02-28 NOTE — Telephone Encounter (Signed)
Nancy aware. She will come by and pick up samples and pt assistance papers to fill out and return.//ah

## 2018-02-28 NOTE — Telephone Encounter (Signed)
Possible. He can try cutting Entresto in half and see if he has any improvements, while we try to get patient asst. I would prefer for him to stay on Entresto; however, if not possible we can change to plain Valsartan.

## 2018-03-06 ENCOUNTER — Other Ambulatory Visit: Payer: Self-pay | Admitting: Cardiology

## 2018-03-06 DIAGNOSIS — I10 Essential (primary) hypertension: Secondary | ICD-10-CM | POA: Diagnosis not present

## 2018-03-07 LAB — BASIC METABOLIC PANEL
BUN/Creatinine Ratio: 18 (ref 10–24)
BUN: 27 mg/dL (ref 8–27)
CO2: 21 mmol/L (ref 20–29)
Calcium: 9.3 mg/dL (ref 8.6–10.2)
Chloride: 95 mmol/L — ABNORMAL LOW (ref 96–106)
Creatinine, Ser: 1.5 mg/dL — ABNORMAL HIGH (ref 0.76–1.27)
GFR calc Af Amer: 57 mL/min/{1.73_m2} — ABNORMAL LOW (ref >59)
GFR calc non Af Amer: 49 mL/min/{1.73_m2} — ABNORMAL LOW (ref >59)
Glucose: 512 mg/dL (ref 65–99)
Potassium: 4.9 mmol/L (ref 3.5–5.2)
Sodium: 131 mmol/L — ABNORMAL LOW (ref 134–144)

## 2018-03-08 ENCOUNTER — Telehealth: Payer: Self-pay

## 2018-03-08 NOTE — Telephone Encounter (Signed)
LVM for pt regarding results. 

## 2018-03-08 NOTE — Telephone Encounter (Signed)
-----   Message from Martin Dunn, NP sent at 03/08/2018  8:19 AM EST ----- Let patient know that kidney function is stable; however, his blood sugar is running very high. He needs to be seeing his PCP regarding this.

## 2018-03-09 DIAGNOSIS — I5043 Acute on chronic combined systolic (congestive) and diastolic (congestive) heart failure: Secondary | ICD-10-CM | POA: Diagnosis not present

## 2018-03-24 DIAGNOSIS — I5042 Chronic combined systolic (congestive) and diastolic (congestive) heart failure: Secondary | ICD-10-CM | POA: Diagnosis not present

## 2018-03-24 DIAGNOSIS — Z4502 Encounter for adjustment and management of automatic implantable cardiac defibrillator: Secondary | ICD-10-CM | POA: Diagnosis not present

## 2018-03-24 DIAGNOSIS — Z95810 Presence of automatic (implantable) cardiac defibrillator: Secondary | ICD-10-CM | POA: Diagnosis not present

## 2018-03-26 ENCOUNTER — Telehealth: Payer: Self-pay | Admitting: Cardiology

## 2018-04-07 DIAGNOSIS — I5043 Acute on chronic combined systolic (congestive) and diastolic (congestive) heart failure: Secondary | ICD-10-CM | POA: Diagnosis not present

## 2018-04-24 DIAGNOSIS — I5042 Chronic combined systolic (congestive) and diastolic (congestive) heart failure: Secondary | ICD-10-CM | POA: Diagnosis not present

## 2018-04-24 DIAGNOSIS — Z95810 Presence of automatic (implantable) cardiac defibrillator: Secondary | ICD-10-CM | POA: Diagnosis not present

## 2018-04-24 DIAGNOSIS — Z4502 Encounter for adjustment and management of automatic implantable cardiac defibrillator: Secondary | ICD-10-CM | POA: Diagnosis not present

## 2018-04-27 ENCOUNTER — Encounter: Payer: Medicare Other | Admitting: Family Medicine

## 2018-04-30 ENCOUNTER — Telehealth: Payer: Self-pay | Admitting: Cardiology

## 2018-05-01 ENCOUNTER — Encounter: Payer: Medicare Other | Admitting: Family Medicine

## 2018-05-02 NOTE — Telephone Encounter (Signed)
ICD function normal, no CHF noted

## 2018-05-08 DIAGNOSIS — I5043 Acute on chronic combined systolic (congestive) and diastolic (congestive) heart failure: Secondary | ICD-10-CM | POA: Diagnosis not present

## 2018-05-25 DIAGNOSIS — Z95810 Presence of automatic (implantable) cardiac defibrillator: Secondary | ICD-10-CM | POA: Diagnosis not present

## 2018-05-25 DIAGNOSIS — Z4502 Encounter for adjustment and management of automatic implantable cardiac defibrillator: Secondary | ICD-10-CM | POA: Diagnosis not present

## 2018-05-25 DIAGNOSIS — I5042 Chronic combined systolic (congestive) and diastolic (congestive) heart failure: Secondary | ICD-10-CM | POA: Diagnosis not present

## 2018-05-29 ENCOUNTER — Other Ambulatory Visit: Payer: Self-pay

## 2018-05-29 ENCOUNTER — Encounter: Payer: Self-pay | Admitting: Family Medicine

## 2018-05-29 ENCOUNTER — Telehealth: Payer: Self-pay | Admitting: Family Medicine

## 2018-05-29 ENCOUNTER — Ambulatory Visit (INDEPENDENT_AMBULATORY_CARE_PROVIDER_SITE_OTHER): Payer: Medicare Other | Admitting: Family Medicine

## 2018-05-29 DIAGNOSIS — E1165 Type 2 diabetes mellitus with hyperglycemia: Secondary | ICD-10-CM

## 2018-05-29 DIAGNOSIS — Z794 Long term (current) use of insulin: Secondary | ICD-10-CM | POA: Diagnosis not present

## 2018-05-29 DIAGNOSIS — I5022 Chronic systolic (congestive) heart failure: Secondary | ICD-10-CM | POA: Diagnosis not present

## 2018-05-29 DIAGNOSIS — E785 Hyperlipidemia, unspecified: Secondary | ICD-10-CM

## 2018-05-29 NOTE — Assessment & Plan Note (Signed)
He will continue pravastatin.  He will get [place] a copy of his lab work.

## 2018-05-29 NOTE — Telephone Encounter (Signed)
Called and spoke with pt. Pt has been scheduled for appt need per PCP. Will need to reach out to [place] in [place] to get last pneumonia vacc and a1c results

## 2018-05-29 NOTE — Assessment & Plan Note (Signed)
Very uncontrolled.  He reports that the [place] did not change his medication regimen and noted that he was supposed to work on dietary changes.  I discussed with the patient that with an A1c of 12 dietary changes are not likely to significantly alter his A1c and diabetes.  I did discuss that he needs to work on dietary changes though we need to work on his medication regimen as well.  I will have him scheduled with our clinical pharmacist to help adjust his current medication regimen.

## 2018-05-29 NOTE — Progress Notes (Signed)
Virtual Visit via telephone Note  This visit type was conducted due to national recommendations for restrictions regarding the COVID-19 pandemic (e.g. social distancing).  This format is felt to be most appropriate for this patient at this time.  All issues noted in this document were discussed and addressed.  No physical exam was performed (except for noted visual exam findings with Video Visits).   I connected with Martin Bush on 05/29/18 at 11:00 AM EDT by telephone and verified that I am speaking with the correct person using two identifiers. Location patient: [place] (second home) Location provider: work Persons participating in the virtual visit: patient, provider, Lyncoln Ledgerwood  I discussed the limitations, risks, security and privacy concerns of performing an evaluation and management service by telephone and the availability of in person appointments. I also discussed with the patient that there may be a patient responsible charge related to this service. The patient expressed understanding and agreed to proceed.  Interactive audio and video telecommunications were attempted between this provider and patient, however failed, due to patient having technical difficulties OR patient did not have access to video capability.  We continued and completed visit with audio only.  Reason for visit: follow-up  HPI: DIABETES Disease Monitoring: Blood Sugar ranges-ranges from 80- 350.  Typically runs in the high 100s to low 200s.  Notes his last A1c was 12 at the [place] 2 months ago. Polyuria/phagia/dipsia-no      Optho-has been seeing at the [facility] every 3 months. Medications: Compliance-taking Jardiance, Victoza, metformin, and NovoLog Mix 70/30 up to 60 units twice daily as needed. Hypoglycemic symptoms-very seldomly.  He will drink soda or take a sugar pill and feels better.  Hyperlipidemia: Taking pravastatin.  No chest pain or shortness of breath.  Notes he did have his cholesterol checked  at the [place] and has these records at his home.  CHF: Continues on carvedilol, hydralazine, Imdur, and torsemide.  He follows with Dr. Einar Gip.  No orthopnea, PND, or edema.  ROS: See pertinent positives and negatives per HPI.  Past Medical History:  Diagnosis Date  . AICD (automatic cardioverter/defibrillator) present 2016  . Arthritis    "thumbs"  (08/02/2017)  . CHF (congestive heart failure) ([place])   . Colon polyps   . Coronary artery disease   . Depression   . High cholesterol   . Hypertension   . MI (myocardial infarction) ([place]) 2003  . OSA on CPAP   . Pneumonia 10/2014  . Proteinuria   . Type II diabetes mellitus (HCC)    insulin dependent    Past Surgical History:  Procedure Laterality Date  . CARDIAC CATHETERIZATION N/A 07/16/2014   Procedure: Left Heart Cath and Coronary Angiography;  Surgeon: Adrian Prows, MD;  Location: [place] [facility];  Service: Cardiovascular;  Laterality: N/A;  . CARDIAC CATHETERIZATION  07/16/2014   Procedure: Coronary Balloon Angioplasty;  Surgeon: Adrian Prows, MD;  Location: [place] [facility];  Service: Cardiovascular;;  . CARDIAC CATHETERIZATION  2003  . CARDIAC DEFIBRILLATOR PLACEMENT  2016  . CATARACT EXTRACTION W/ INTRAOCULAR LENS IMPLANT Left 03/2014  . CORONARY ANGIOPLASTY WITH STENT PLACEMENT     "I've got a total of 8 stents in there; mostly doine at [facility]" (08/02/2017)  . CORONARY ARTERY BYPASS GRAFT  ~ 2003   "CABG X2"; [facility]  . CORONARY ATHERECTOMY N/A 08/16/2017   Procedure: CORONARY ATHERECTOMY;  Surgeon: Nigel Mormon, MD;  Location: [place] [facility];  Service: Cardiovascular;  Laterality: N/A;  .  CORONARY BALLOON ANGIOPLASTY N/A 08/04/2017   Procedure: CORONARY BALLOON ANGIOPLASTY;  Surgeon: Adrian Prows, MD;  Location: [place] [facility];  Service: Cardiovascular;  Laterality: N/A;  . CORONARY STENT INTERVENTION N/A 08/16/2017   Procedure: CORONARY STENT INTERVENTION;  Surgeon: Nigel Mormon, MD;  Location: [place] [facility];  Service: Cardiovascular;  Laterality: N/A;  . ELBOW SURGERY Left ?2001   "pinched nerve"  . LEFT HEART CATH AND CORS/GRAFTS ANGIOGRAPHY N/A 08/04/2017   Procedure: LEFT HEART CATH AND CORS/GRAFTS ANGIOGRAPHY;  Surgeon: Adrian Prows, MD;  Location: [place] [facility];  Service: Cardiovascular;  Laterality: N/A;  . LEFT HEART CATH AND CORS/GRAFTS ANGIOGRAPHY N/A 08/20/2017   Procedure: LEFT HEART CATH AND CORS/GRAFTS ANGIOGRAPHY;  Surgeon: Nigel Mormon, MD;  Location: [place] [facility];  Service: Cardiovascular;  Laterality: N/A;    Family History  Problem Relation Age of Onset  . Uterine cancer Mother   . Lung cancer Mother   . Hyperlipidemia Father   . Heart disease Father   . Hypertension Father   . Diabetes Father     SOCIAL HX: Non-smoker.   Current Outpatient Medications:  .  amiodarone (PACERONE) 200 MG tablet, Take 200 mg by mouth daily. , Disp: , Rfl:  .  aspirin EC 81 MG tablet, Take 81 mg by mouth daily., Disp: , Rfl:  .  carvedilol (COREG) 12.5 MG tablet, Take 6.25 mg by mouth 2 (two) times daily with a meal., Disp: , Rfl:  .  Cholecalciferol (VITAMIN D) 2000 units tablet, Take 2,000 Units by mouth 2 (two) times daily., Disp: , Rfl:  .  empagliflozin (JARDIANCE) 25 MG TABS tablet, Take 25 mg by mouth daily., Disp: , Rfl:  .  hydrALAZINE (APRESOLINE) 25 MG tablet, Take 25 mg by mouth 2 (two) times daily., Disp: , Rfl:  .  hydrocortisone 2.5 % cream, Apply topically 2 (two) times daily. Right back for itching (Patient taking differently: Apply 1 application topically 2 (two) times daily as needed (itching). Right back for itching), Disp: 30 g, Rfl: 1 .  insulin aspart protamine - aspart (NOVOLOG MIX 70/30 FLEXPEN) (70-30) 100 UNIT/ML FlexPen, Inject 60 Units into the skin 2 (two) times daily as needed (CBG >150)., Disp: , Rfl:  .  isosorbide mononitrate (IMDUR) 30 MG 24 hr tablet, Take 30 mg by mouth daily., Disp: , Rfl:  .  liraglutide (VICTOZA) 18  MG/3ML SOPN, Inject 1.8 mg into the skin daily., Disp: , Rfl:  .  Meloxicam (MOBIC PO), Take 1 tablet by mouth daily as needed (severe back pain)., Disp: , Rfl:  .  metFORMIN (GLUCOPHAGE) 1000 MG tablet, Take 1 tablet (1,000 mg total) by mouth 2 (two) times daily with a meal., Disp: , Rfl:  .  mupirocin ointment (BACTROBAN) 2 %, Apply 1 application topically 2 (two) times daily. To right back, Disp: 30 g, Rfl: 0 .  Omega-3 Fatty Acids (FISH OIL) 1000 MG CAPS, Take 2,000 mg by mouth 3 (three) times daily., Disp: , Rfl:  .  potassium chloride SA (K-DUR,KLOR-CON) 20 MEQ tablet, Take 1 tablet (20 mEq total) by mouth 2 (two) times daily., Disp: 60 tablet, Rfl: 3 .  pravastatin (PRAVACHOL) 80 MG tablet, Take 80 mg by mouth at bedtime. , Disp: , Rfl:  .  PRESCRIPTION MEDICATION, Inhale into the lungs at bedtime. CPAP, Disp: , Rfl:  .  PRESCRIPTION MEDICATION, Take 1 tablet by mouth daily as needed (for weight gain of more than 2 lbs in  one day)., Disp: , Rfl:  .  sertraline (ZOLOFT) 100 MG tablet, Take 50 mg by mouth at bedtime. , Disp: , Rfl:  .  ticagrelor (BRILINTA) 90 MG TABS tablet, Take 1 tablet (90 mg total) by mouth 2 (two) times daily., Disp: 60 tablet, Rfl: 3 .  torsemide (DEMADEX) 20 MG tablet, Take 1 tablet (20 mg total) by mouth 2 (two) times daily., Disp: 60 tablet, Rfl: 3  EXAM: This was a telehealth telephone visit and thus no physical exam was completed.  ASSESSMENT AND PLAN:  Discussed the following assessment and plan:  Chronic systolic congestive heart failure (HCC)  Type 2 diabetes mellitus with hyperglycemia, with long-term current use of insulin (HCC)  Hyperlipidemia, unspecified hyperlipidemia type  CHF (congestive heart failure) ([place]) Doing well.  He will continue his current medication regimen through cardiology.  He will continue to follow with cardiology.  Diabetes ([place]) Very uncontrolled.  He reports that the [place] did not change his medication regimen and noted that  he was supposed to work on dietary changes.  I discussed with the patient that with an A1c of 12 dietary changes are not likely to significantly alter his A1c and diabetes.  I did discuss that he needs to work on dietary changes though we need to work on his medication regimen as well.  I will have him scheduled with our clinical pharmacist to help adjust his current medication regimen.  Hyperlipidemia He will continue pravastatin.  He will get [place] a copy of his lab work.  Social distancing precautions and sick precautions given regarding COVID-19.  Patient reports he was scheduled for colonoscopy though this was canceled in the setting of COVID-19.  He will get this rescheduled when able.   I discussed the assessment and treatment plan with the patient. The patient was provided an opportunity to ask questions and all were answered. The patient agreed with the plan and demonstrated an understanding of the instructions.   The patient was advised to call back or seek an in-person evaluation if the symptoms worsen or if the condition fails to improve as anticipated.  I provided 17 minutes of non-face-to-face time during this encounter.   Tommi Rumps, MD

## 2018-05-29 NOTE — Telephone Encounter (Signed)
Please contact the patient and get him set up for an appointment with the clinical pharmacist for management of his diabetes.  He reports his most recent A1c was 12 at the [place] about 2 months ago.  Please also get him set up for follow-up with me in 3 months in the office.

## 2018-05-29 NOTE — Assessment & Plan Note (Signed)
Doing well.  He will continue his current medication regimen through cardiology.  He will continue to follow with cardiology.

## 2018-05-31 NOTE — Telephone Encounter (Signed)
Fax number is [phone]   Fax sent to [facility] requesting a1c results and vacc record

## 2018-06-01 ENCOUNTER — Other Ambulatory Visit: Payer: Self-pay | Admitting: Cardiology

## 2018-06-02 ENCOUNTER — Ambulatory Visit: Payer: Self-pay | Admitting: Cardiology

## 2018-06-07 ENCOUNTER — Telehealth: Payer: Self-pay

## 2018-06-07 DIAGNOSIS — I5043 Acute on chronic combined systolic (congestive) and diastolic (congestive) heart failure: Secondary | ICD-10-CM | POA: Diagnosis not present

## 2018-06-07 NOTE — Telephone Encounter (Signed)
Research labs glucose 642 value ; Please advise

## 2018-06-08 NOTE — Telephone Encounter (Signed)
He sees Dr. Tommi Rumps, as well as the [place]. I would let Dr. Ellen Henri office know.

## 2018-06-09 ENCOUNTER — Telehealth: Payer: Self-pay | Admitting: Family Medicine

## 2018-06-09 ENCOUNTER — Telehealth: Payer: Self-pay | Admitting: *Deleted

## 2018-06-09 NOTE — Telephone Encounter (Signed)
PCP contacted with results and will f/u with patient

## 2018-06-09 NOTE — Telephone Encounter (Signed)
Spoken to patients wife. Patient has been taking his medications correctly, patient has a Follow up appointment on [id number].  Patient will return call back with BS once finished mowing.

## 2018-06-09 NOTE — Telephone Encounter (Signed)
Pt's wife stated BS was 412.  Katherina Wimer,cma

## 2018-06-09 NOTE — Telephone Encounter (Signed)
Patient wife called in and stated husbands blood sugar was 412.

## 2018-06-09 NOTE — Telephone Encounter (Signed)
[facility]  Glucose 642 value -lab draw- 5/13 results through research study   Patient was informed of value- patient states he is not going to [place] for medication since COVID- please reach out. (Not sure he is even taking medication- uncontroled)

## 2018-06-09 NOTE — Telephone Encounter (Signed)
With a blood glucose that high he should be evaluated today at an urgent care to have blood work completed and be evaluated for any symptoms. If he has nausea, vomiting, or confusion he needs to be seen in the ED. Thanks.

## 2018-06-09 NOTE — Telephone Encounter (Signed)
I attempted to contact the patient regarding my prior message. There was no answer. I left a message asking him to call back to the office as soon as he got the message. When he calls back please relay my prior message.

## 2018-06-09 NOTE — Telephone Encounter (Signed)
Please contact the patient and see if he can check his glucose at home. If he can please find out what it is now. Please see if he is taking his diabetes medications. If his glucose remains that elevated he will need to go to the ED for evaluation. Thanks.

## 2018-06-09 NOTE — Telephone Encounter (Signed)
Called and left a voicemail to return call.  Harpreet Pompey,cma

## 2018-06-09 NOTE — Telephone Encounter (Signed)
Shelton Silvas [phone] from [employer] called regarding if a fax was received for pt to have statin to be accessed? Please advise?   Please call for more information. Thanks

## 2018-06-12 ENCOUNTER — Encounter: Payer: Self-pay | Admitting: Family Medicine

## 2018-06-12 ENCOUNTER — Ambulatory Visit (INDEPENDENT_AMBULATORY_CARE_PROVIDER_SITE_OTHER): Payer: No Typology Code available for payment source | Admitting: Cardiology

## 2018-06-12 ENCOUNTER — Ambulatory Visit (INDEPENDENT_AMBULATORY_CARE_PROVIDER_SITE_OTHER): Payer: Medicare Other | Admitting: Family Medicine

## 2018-06-12 ENCOUNTER — Other Ambulatory Visit: Payer: Self-pay

## 2018-06-12 ENCOUNTER — Encounter: Payer: Self-pay | Admitting: Cardiology

## 2018-06-12 VITALS — BP 98/52 | HR 82 | Temp 97.0°F | Ht 67.0 in | Wt 216.0 lb

## 2018-06-12 DIAGNOSIS — E1165 Type 2 diabetes mellitus with hyperglycemia: Secondary | ICD-10-CM | POA: Diagnosis not present

## 2018-06-12 DIAGNOSIS — I255 Ischemic cardiomyopathy: Secondary | ICD-10-CM

## 2018-06-12 DIAGNOSIS — E1122 Type 2 diabetes mellitus with diabetic chronic kidney disease: Secondary | ICD-10-CM

## 2018-06-12 DIAGNOSIS — N183 Chronic kidney disease, stage 3 unspecified: Secondary | ICD-10-CM

## 2018-06-12 DIAGNOSIS — I25810 Atherosclerosis of coronary artery bypass graft(s) without angina pectoris: Secondary | ICD-10-CM

## 2018-06-12 DIAGNOSIS — I2510 Atherosclerotic heart disease of native coronary artery without angina pectoris: Secondary | ICD-10-CM

## 2018-06-12 DIAGNOSIS — IMO0002 Reserved for concepts with insufficient information to code with codable children: Secondary | ICD-10-CM

## 2018-06-12 DIAGNOSIS — Z006 Encounter for examination for normal comparison and control in clinical research program: Secondary | ICD-10-CM

## 2018-06-12 DIAGNOSIS — Z95810 Presence of automatic (implantable) cardiac defibrillator: Secondary | ICD-10-CM

## 2018-06-12 DIAGNOSIS — I5023 Acute on chronic systolic (congestive) heart failure: Secondary | ICD-10-CM

## 2018-06-12 NOTE — Progress Notes (Signed)
Primary Physician/Referring:  [name], MD  Patient ID: Martin Bush, male    DOB: 01/24/1956, 63 y.o.   MRN: [id number]  Chief Complaint  Patient presents with   Congestive Heart Failure    3 month f/u     HPI: Martin Bush  is a 63 y.o. male  with hypertension, type 2 diabetes mellitus, hyperlipidemia, ischemic cardiomyopathy EF 20-25% status post [employer] BiV ICD placement, h.o sustained VT in 07/2017, coronary artery disease status post CABG 2 and multiple PCI's, including atherectomy and complex PCI with overlapping stents in left main extending into left PDA with synergy 3.0 x 20 and 3.5 x 20 mm drug-eluting stents (08/16/2017), OSA on CPAP, CKD 3.  Patient recently had labs performed with research study showing proBNP of 3000 and glucose in the 600's. I had asked him to come in for follow up. Patient is doing well without any complaints today. He is on max dose of Entresto. No worsening shortness of breath or significant leg edema. He uses torsemide as needed. No dizziness or lightheadedness. Admits to being noncompliant with his diet recently and was drinking regular sodas and has been eating out at fast food restaurants.  He has remained active around his house. No chest pain. He is seeing Dr. Caryl Bis this afternoon for follow up. Reports that he had 2 episodes one on friday and one on saturday, after taking a few of his medications, he got nauseaous and threw up a "white, spongy" type object about the size of his glucose lancets. He has a picture with him today to show me. He felt was related to Metformin and has since stopped the medicine and has not had any further episodes.  Past Medical History:  Diagnosis Date   AICD (automatic cardioverter/defibrillator) present 2016   Arthritis    "thumbs"  (08/02/2017)   CHF (congestive heart failure) (HCC)    Colon polyps    Coronary artery disease    Depression    High cholesterol    Hypertension    MI  (myocardial infarction) ([place]) 2003   OSA on CPAP    Pneumonia 10/2014   Proteinuria    Type II diabetes mellitus ([place])    insulin dependent    Past Surgical History:  Procedure Laterality Date   CARDIAC CATHETERIZATION N/A 07/16/2014   Procedure: Left Heart Cath and Coronary Angiography;  Surgeon: Adrian Prows, MD;  Location: [place] [facility];  Service: Cardiovascular;  Laterality: N/A;   CARDIAC CATHETERIZATION  07/16/2014   Procedure: Coronary Balloon Angioplasty;  Surgeon: Adrian Prows, MD;  Location: [place] [facility];  Service: Cardiovascular;;   CARDIAC CATHETERIZATION  2003   CARDIAC DEFIBRILLATOR PLACEMENT  2016   CATARACT EXTRACTION W/ INTRAOCULAR LENS IMPLANT Left 03/2014   CORONARY ANGIOPLASTY WITH STENT PLACEMENT     "I've got a total of 8 stents in there; mostly doine at [facility]" (08/02/2017)   CORONARY ARTERY BYPASS GRAFT  ~ 2003   "CABG X2"; [facility]   CORONARY ATHERECTOMY N/A 08/16/2017   Procedure: CORONARY ATHERECTOMY;  Surgeon: Nigel Mormon, MD;  Location: [place] [facility];  Service: Cardiovascular;  Laterality: N/A;   CORONARY BALLOON ANGIOPLASTY N/A 08/04/2017   Procedure: CORONARY BALLOON ANGIOPLASTY;  Surgeon: Adrian Prows, MD;  Location: [place] [facility];  Service: Cardiovascular;  Laterality: N/A;   CORONARY STENT INTERVENTION N/A 08/16/2017   Procedure: CORONARY STENT INTERVENTION;  Surgeon: Nigel Mormon, MD;  Location: [place] [facility];  Service:  Cardiovascular;  Laterality: N/A;   ELBOW SURGERY Left ?2001   "pinched nerve"   LEFT HEART CATH AND CORS/GRAFTS ANGIOGRAPHY N/A 08/04/2017   Procedure: LEFT HEART CATH AND CORS/GRAFTS ANGIOGRAPHY;  Surgeon: Adrian Prows, MD;  Location: [place] [facility];  Service: Cardiovascular;  Laterality: N/A;   LEFT HEART CATH AND CORS/GRAFTS ANGIOGRAPHY N/A 08/20/2017   Procedure: LEFT HEART CATH AND CORS/GRAFTS ANGIOGRAPHY;  Surgeon: Nigel Mormon, MD;  Location: [place] [facility];   Service: Cardiovascular;  Laterality: N/A;    Social History   Socioeconomic History   Marital status: Married    Spouse name: Not on file   Number of children: Not on file   Years of education: Not on file   Highest education level: Not on file  Occupational History   Not on file  Social Needs   Financial resource strain: Not hard at all   Food insecurity:    Worry: Never true    Inability: Never true   Transportation needs:    Medical: No    Non-medical: No  Tobacco Use   Smoking status: Never Smoker   Smokeless tobacco: Former Systems developer  Substance and Sexual Activity   Alcohol use: Yes    Comment: 08/02/2017 "maybe 1 drink/year"   Drug use: Never   Sexual activity: Not Currently  Lifestyle   Physical activity:    Days per week: 0 days    Minutes per session: Not on file   Stress: Not at all  Relationships   Social connections:    Talks on phone: Twice a week    Gets together: Twice a week    Attends religious service: More than 4 times per year    Active member of club or organization: Yes    Attends meetings of clubs or organizations: Never    Relationship status: Married   Intimate partner violence:    Fear of current or ex partner: No    Emotionally abused: No    Physically abused: No    Forced sexual activity: No  Other Topics Concern   Not on file  Social History Narrative   Not on file    Current Outpatient Medications on File Prior to Visit  Medication Sig Dispense Refill   amiodarone (PACERONE) 200 MG tablet Take 200 mg by mouth daily.      aspirin EC 81 MG tablet Take 81 mg by mouth daily.     carvedilol (COREG) 12.5 MG tablet Take 6.25 mg by mouth 2 (two) times daily with a meal.     Cholecalciferol (VITAMIN D) 2000 units tablet Take 2,000 Units by mouth 2 (two) times daily.     empagliflozin (JARDIANCE) 25 MG TABS tablet Take 25 mg by mouth daily.     hydrALAZINE (APRESOLINE) 25 MG tablet Take 25 mg by mouth 2 (two) times  daily.     hydrocortisone 2.5 % cream Apply topically 2 (two) times daily. Right back for itching (Patient taking differently: Apply 1 application topically 2 (two) times daily as needed (itching). Right back for itching) 30 g 1   insulin aspart protamine - aspart (NOVOLOG MIX 70/30 FLEXPEN) (70-30) 100 UNIT/ML FlexPen Inject 60 Units into the skin 2 (two) times daily as needed (CBG >150).     isosorbide mononitrate (IMDUR) 30 MG 24 hr tablet Take 30 mg by mouth daily.     liraglutide (VICTOZA) 18 MG/3ML SOPN Inject 1.8 mg into the skin daily.     Meloxicam (MOBIC PO) Take  1 tablet by mouth daily as needed (severe back pain).     metolazone (ZAROXOLYN) 2.5 MG tablet Take 2.5 mg by mouth daily as needed (for >2 lb weight gain).     mupirocin ointment (BACTROBAN) 2 % Apply 1 application topically 2 (two) times daily. To right back 30 g 0   Omega-3 Fatty Acids (FISH OIL) 1000 MG CAPS Take 2,000 mg by mouth 3 (three) times daily.     potassium chloride SA (K-DUR,KLOR-CON) 20 MEQ tablet Take 1 tablet (20 mEq total) by mouth 2 (two) times daily. 60 tablet 3   pravastatin (PRAVACHOL) 80 MG tablet Take 80 mg by mouth at bedtime.      PRESCRIPTION MEDICATION Inhale into the lungs at bedtime. CPAP     sacubitril-valsartan (ENTRESTO) 97-103 MG Take 1 tablet by mouth 2 (two) times daily.     sertraline (ZOLOFT) 100 MG tablet Take 50 mg by mouth at bedtime.      ticagrelor (BRILINTA) 90 MG TABS tablet Take 1 tablet (90 mg total) by mouth 2 (two) times daily. 60 tablet 3   torsemide (DEMADEX) 20 MG tablet TAKE 1 TABLET BY MOUTH TWICE A DAY 60 tablet 3   metFORMIN (GLUCOPHAGE) 1000 MG tablet Take 1 tablet (1,000 mg total) by mouth 2 (two) times daily with a meal.     No current facility-administered medications on file prior to visit.     Review of Systems  Constitution: Positive for weight gain. Negative for decreased appetite, malaise/fatigue and weight loss.  Eyes: Negative for visual  disturbance.  Cardiovascular: Positive for dyspnea on exertion. Negative for chest pain, claudication, leg swelling, orthopnea, palpitations and syncope.  Respiratory: Negative for hemoptysis and wheezing.   Endocrine: Negative for cold intolerance and heat intolerance.  Hematologic/Lymphatic: Does not bruise/bleed easily.  Skin: Negative for nail changes.  Musculoskeletal: Negative for muscle weakness and myalgias.  Gastrointestinal: Negative for abdominal pain, change in bowel habit, nausea and vomiting.  Neurological: Negative for difficulty with concentration, dizziness, focal weakness and headaches.  Psychiatric/Behavioral: Negative for altered mental status and suicidal ideas.  All other systems reviewed and are negative.     Objective  Blood pressure (!) 98/52, pulse 82, temperature (!) 97 F (36.1 C), height 5\' 7"  (1.702 m), weight 216 lb (98 kg), SpO2 98 %. Body mass index is 33.83 kg/m.    Physical Exam  Constitutional: He is oriented to person, place, and time. Vital signs are normal. He appears well-developed and well-nourished. He does not appear ill. No distress.  HENT:  Head: Normocephalic and atraumatic.  Neck: Normal range of motion. Neck supple.  Cardiovascular: Normal rate, regular rhythm, normal heart sounds and intact distal pulses.  Pulses:      Femoral pulses are 2+ on the right side and 2+ on the left side.      Popliteal pulses are 1+ on the right side and 1+ on the left side.       Dorsalis pedis pulses are 0 on the right side and 0 on the left side.       Posterior tibial pulses are 0 on the right side and 0 on the left side.  2+ pitting edema bilateral  Pulmonary/Chest: Effort normal and breath sounds normal. No accessory muscle usage. No respiratory distress.  Abdominal: Soft. Bowel sounds are normal. A hernia is present. Hernia confirmed positive in the ventral area (reducible).  Musculoskeletal: Normal range of motion.  Neurological: He is alert and  oriented to person, place,  and time.  Skin: Skin is warm and dry.  Vitals reviewed.  Radiology: No results found.  Laboratory examination:    Labs 06/06/2018: Na 128, K 4.6, Creatinine 1.7, Glucose 642, CMP otherwise normal. CBC normal. NT proBNP 3369.  CMP Latest Ref Rng & Units 03/06/2018 01/07/2018 01/07/2018  Glucose 65 - 99 mg/dL 512(HH) 284(H) 330(H)  BUN 8 - 27 mg/dL 27 32(H) 35(H)  Creatinine 0.76 - 1.27 mg/dL 1.50(H) 1.71(H) 1.70(H)  Sodium 134 - 144 mmol/L 131(L) 131(L) 132(L)  Potassium 3.5 - 5.2 mmol/L 4.9 3.4(L) 2.7(LL)  Chloride 96 - 106 mmol/L 95(L) 88(L) 88(L)  CO2 20 - 29 mmol/L 21 30 -  Calcium 8.6 - 10.2 mg/dL 9.3 8.6(L) -  Total Protein 6.0 - 8.3 g/dL - - -  Total Bilirubin 0.2 - 1.2 mg/dL - - -  Alkaline Phos 39 - 117 U/L - - -  AST 0 - 37 U/L - - -  ALT 0 - 53 U/L - - -   CBC Latest Ref Rng & Units 01/07/2018 10/26/2017 09/19/2017  WBC 4.0 - 10.5 K/uL - 7.3 6.8  Hemoglobin 13.0 - 17.0 g/dL 16.0 12.8(L) 11.1(L)  Hematocrit 39.0 - 52.0 % 47.0 41.3 34.0(L)  Platelets 150 - 400 K/uL - 215 320.0   Lipid Panel     Component Value Date/Time   CHOL 145 07/16/2014 0310   TRIG 223 (H) 07/16/2014 0310   HDL 26 (L) 07/16/2014 0310   CHOLHDL 5.6 07/16/2014 0310   VLDL 45 (H) 07/16/2014 0310   LDLCALC 74 07/16/2014 0310   HEMOGLOBIN A1C Lab Results  Component Value Date   HGBA1C 11.4 (A) 01/19/2018   MPG 263 07/12/2015   TSH Recent Labs    09/19/17 1102  TSH 2.30    Cardiac Studies:   Coronary angiography, PCI 08/16/2017: Successful orbital atherectomy w/ Diamondback Left main/prox LCx Successful DES placement Lt PDA 3.0 X 20 Synergy Lt PDA, 0% residual stenosis Successful DES placement Lt PDA 3.5 X 20 Synergy Left main, residual stenosis  Echo 09/01/2017: Left ventricle: The cavity size was mildly dilated. Wall   thickness was normal. Systolic function was severely reduced. The   estimated ejection fraction was in the range of 20% to 25%.    Moderate global hypokinesis. Severe distal anteroseptal   hypokinesis. Severe inferolateral hypokinesis. Apical akinesis.   Features are consistent with a pseudonormal left ventricular   filling pattern, with concomitant abnormal relaxation and   increased filling pressure (grade 2 diastolic dysfunction). - Right ventricle: Systolic function was mildly reduced. - Right atrium: Central venous pressure (est): 3 mm Hg. - This was a limited study to assess systolic and diastolic   function.   No significant change compared to previous study on 08/03/2017.  Renal artery [place] 09/02/2017: Right: Abnormal right Resistive Index. No evidence of right renal artery stenosis. Left:  Normal left Resistive Index. No evidence of left renal artery stenosis. Mesenteric: Normal Celiac artery findings. 70 to 99% stenosis in the superior mesenteric artery.  Echocardiogram 08/31/2016: Poor echo window. Wall motion abnormality has reduced sensitivity. Left ventricle cavity is mildly dilated at 6.3 cm. Moderate concentric hypertrophy of the left ventricle. inferior wall is akinetic with mild global hypokinesis. Visual EF is 35-40%. Doppler evidence of grade II (pseudonormal) diastolic dysfunction. Diastolic dysfunction findings suggests elevated LA/LV end diastolic pressure. Left atrial cavity is moderately dilated at 4.4 cm. Right ventricle cavity is normal in size. Normal right ventricular function. Pacemaker lead/ICD lead noted in the RV.  Trace mitral regurgitation. Trace tricuspid regurgitation. Compared to 05/21/2015, EF appears to have improved from 25-30% to present 40%.  Assessment   Acute on chronic systolic heart failure (HCC) - Plan: Basic Metabolic Panel (BMET), B Nat Peptide, B Nat Peptide, Basic Metabolic Panel (BMET)  Ischemic cardiomyopathy  Coronary artery disease involving autologous artery coronary bypass graft without angina pectoris - CABG 2 with LIMA to LAD and SVG to dominant Cx.Cath  08/04/2017: Patent LIMA to LAD. LAD prox occlusion. Occluded SVG to OM. Small diffusely diseased RCA   Coronary artery disease involving native coronary artery of native heart without angina pectoris - Coronary angiography 08/16/2017:Orbital atherectomy w/ Diamondback Left main/prox LCx 3.5 X 20 Synergy Left main, 3.0 X 20 Synergy Lt PDA.  Uncontrolled type 2 diabetes with stage 3 chronic kidney disease GFR 30-59 (HCC)  ICD (implantable cardioverter-defibrillator) in place  Enrolled in clinical trial of drug -  Enrolled in Injectafer Trial "HEART FID" for patients with LV systolic dysfunction and anemia.  EKG 09/15/2017: Normal sinus rhythm at rate of 72 bpm, left atrial enlargement, biventricular pacemaker detected. No further analysis.  Recommendations:   Patient seen today for follow-up for CHF.  He has recently had labs performed by research and proBNP was elevated over 3000.  He was also noted to have markedly elevated glucose level of 600.  He admits for the last few weeks he has not been compliant with his diet and was drinking regular sodas and eating out.  Sodium level was also low that is suggestive of CHF.  He has 2+ pitting edema on exam today, but fortunately symptomatically he has remained stable.  I have urged him to take dose of metolazone for the next few days. I have stressed the importance of changing his diet to help both with his CHF and diabetes.  Blood pressure is borderline soft today, denies any dizziness.  No changes were made to his medications at this time.  I will repeat BMP and BNP in 10 days and would like to see him back at that time for close monitoring.  No symptoms of angina. He is to see PCP this afternoon for follow-up on diabetes.   Miquel Dunn, MSN, APRN, FNP-C [facility]. [place] Office: [phone] Fax: [phone]

## 2018-06-12 NOTE — Telephone Encounter (Signed)
Lm for the patient to call back and let us know how his BS are doing.  Martin Bush,cma

## 2018-06-12 NOTE — Progress Notes (Signed)
Virtual Visit via video note  This visit type was conducted due to national recommendations for restrictions regarding the COVID-19 pandemic (e.g. social distancing).  This format is felt to be most appropriate for this patient at this time.  All issues noted in this document were discussed and addressed.  No physical exam was performed (except for noted visual exam findings with Video Visits).   I connected with Martin Bush today at  4:00 PM EDT by a video enabled telemedicine application and verified that I am speaking with the correct person using two identifiers. Location patient: home Location provider: work Persons participating in the virtual visit: patient, provider, Rutger Salton  I discussed the limitations, risks, security and privacy concerns of performing an evaluation and management service by telephone and the availability of in person appointments. I also discussed with the patient that there may be a patient responsible charge related to this service. The patient expressed understanding and agreed to proceed.   Reason for visit: Follow-up  HPI: Diabetes: Patient had labs drawn for a research study through his cardiologist and his blood sugar was noted to be in the 600s.  This occurred last week.  He notes he had no abnormal symptoms with this.  He notes he had been drinking a lot of regular Cokes every day for some time.  He notes he discontinued the Cokes over the weekend as his sugar had been in the 300s and then over the last 2 days his blood sugar has dropped into the 100s.  He continues on Jardiance, NovoLog 70/30 mix with 60 units every morning and typically 60 units in the evening unless his blood sugar is less than 180.  He also takes metformin.  No polyuria or polydipsia.  No hypoglycemia.  He is eating normally and is trying to avoid red meat and pork.  He has been eating some pasta as well as salads.  Not many sweets.  No sweet tea.  Not much junk food.  He did see  his cardiologist prior to his visit with me and notes they were going to recheck labs in 1 to 2 weeks.   ROS: See pertinent positives and negatives per HPI.  Past Medical History:  Diagnosis Date   AICD (automatic cardioverter/defibrillator) present 2016   Arthritis    "thumbs"  (08/02/2017)   CHF (congestive heart failure) (HCC)    Colon polyps    Coronary artery disease    Depression    High cholesterol    Hypertension    MI (myocardial infarction) ([place]) 2003   OSA on CPAP    Pneumonia 10/2014   Proteinuria    Type II diabetes mellitus ([place])    insulin dependent    Past Surgical History:  Procedure Laterality Date   CARDIAC CATHETERIZATION N/A 07/16/2014   Procedure: Left Heart Cath and Coronary Angiography;  Surgeon: Adrian Prows, MD;  Location: [place] [facility];  Service: Cardiovascular;  Laterality: N/A;   CARDIAC CATHETERIZATION  07/16/2014   Procedure: Coronary Balloon Angioplasty;  Surgeon: Adrian Prows, MD;  Location: [place] [facility];  Service: Cardiovascular;;   CARDIAC CATHETERIZATION  2003   CARDIAC DEFIBRILLATOR PLACEMENT  2016   CATARACT EXTRACTION W/ INTRAOCULAR LENS IMPLANT Left 03/2014   CORONARY ANGIOPLASTY WITH STENT PLACEMENT     "I've got a total of 8 stents in there; mostly doine at [facility]" (08/02/2017)   CORONARY ARTERY BYPASS GRAFT  ~ 2003   "CABG X2"; [facility]   CORONARY  ATHERECTOMY N/A 08/16/2017   Procedure: CORONARY ATHERECTOMY;  Surgeon: Nigel Mormon, MD;  Location: [place] [facility];  Service: Cardiovascular;  Laterality: N/A;   CORONARY BALLOON ANGIOPLASTY N/A 08/04/2017   Procedure: CORONARY BALLOON ANGIOPLASTY;  Surgeon: Adrian Prows, MD;  Location: [place] [facility];  Service: Cardiovascular;  Laterality: N/A;   CORONARY STENT INTERVENTION N/A 08/16/2017   Procedure: CORONARY STENT INTERVENTION;  Surgeon: Nigel Mormon, MD;  Location: [place] [facility];  Service: Cardiovascular;  Laterality: N/A;    ELBOW SURGERY Left ?2001   "pinched nerve"   LEFT HEART CATH AND CORS/GRAFTS ANGIOGRAPHY N/A 08/04/2017   Procedure: LEFT HEART CATH AND CORS/GRAFTS ANGIOGRAPHY;  Surgeon: Adrian Prows, MD;  Location: [place] [facility];  Service: Cardiovascular;  Laterality: N/A;   LEFT HEART CATH AND CORS/GRAFTS ANGIOGRAPHY N/A 08/20/2017   Procedure: LEFT HEART CATH AND CORS/GRAFTS ANGIOGRAPHY;  Surgeon: Nigel Mormon, MD;  Location: [place] [facility];  Service: Cardiovascular;  Laterality: N/A;    Family History  Problem Relation Age of Onset   Uterine cancer Mother    Lung cancer Mother    Hyperlipidemia Father    Heart disease Father    Hypertension Father    Diabetes Father     SOCIAL HX: Former user of smokeless tobacco   Current Outpatient Medications:    amiodarone (PACERONE) 200 MG tablet, Take 200 mg by mouth daily. , Disp: , Rfl:    aspirin EC 81 MG tablet, Take 81 mg by mouth daily., Disp: , Rfl:    carvedilol (COREG) 12.5 MG tablet, Take 6.25 mg by mouth 2 (two) times daily with a meal., Disp: , Rfl:    Cholecalciferol (VITAMIN D) 2000 units tablet, Take 2,000 Units by mouth 2 (two) times daily., Disp: , Rfl:    empagliflozin (JARDIANCE) 25 MG TABS tablet, Take 25 mg by mouth daily., Disp: , Rfl:    hydrALAZINE (APRESOLINE) 25 MG tablet, Take 25 mg by mouth 2 (two) times daily., Disp: , Rfl:    hydrocortisone 2.5 % cream, Apply topically 2 (two) times daily. Right back for itching (Patient taking differently: Apply 1 application topically 2 (two) times daily as needed (itching). Right back for itching), Disp: 30 g, Rfl: 1   insulin aspart protamine - aspart (NOVOLOG MIX 70/30 FLEXPEN) (70-30) 100 UNIT/ML FlexPen, Inject 60 Units into the skin 2 (two) times daily as needed (CBG >150)., Disp: , Rfl:    isosorbide mononitrate (IMDUR) 30 MG 24 hr tablet, Take 30 mg by mouth daily., Disp: , Rfl:    liraglutide (VICTOZA) 18 MG/3ML SOPN, Inject 1.8 mg into the skin daily.,  Disp: , Rfl:    Meloxicam (MOBIC PO), Take 1 tablet by mouth daily as needed (severe back pain)., Disp: , Rfl:    metFORMIN (GLUCOPHAGE) 1000 MG tablet, Take 1 tablet (1,000 mg total) by mouth 2 (two) times daily with a meal., Disp: , Rfl:    metolazone (ZAROXOLYN) 2.5 MG tablet, Take 2.5 mg by mouth daily as needed (for >2 lb weight gain)., Disp: , Rfl:    mupirocin ointment (BACTROBAN) 2 %, Apply 1 application topically 2 (two) times daily. To right back, Disp: 30 g, Rfl: 0   Omega-3 Fatty Acids (FISH OIL) 1000 MG CAPS, Take 2,000 mg by mouth 3 (three) times daily., Disp: , Rfl:    potassium chloride SA (K-DUR,KLOR-CON) 20 MEQ tablet, Take 1 tablet (20 mEq total) by mouth 2 (two) times daily., Disp: 60 tablet, Rfl:  3   pravastatin (PRAVACHOL) 80 MG tablet, Take 80 mg by mouth at bedtime. , Disp: , Rfl:    PRESCRIPTION MEDICATION, Inhale into the lungs at bedtime. CPAP, Disp: , Rfl:    sacubitril-valsartan (ENTRESTO) 97-103 MG, Take 1 tablet by mouth 2 (two) times daily., Disp: , Rfl:    sertraline (ZOLOFT) 100 MG tablet, Take 50 mg by mouth at bedtime. , Disp: , Rfl:    ticagrelor (BRILINTA) 90 MG TABS tablet, Take 1 tablet (90 mg total) by mouth 2 (two) times daily., Disp: 60 tablet, Rfl: 3   torsemide (DEMADEX) 20 MG tablet, TAKE 1 TABLET BY MOUTH TWICE A DAY, Disp: 60 tablet, Rfl: 3  EXAM:  VITALS per patient if applicable: None.  GENERAL: alert, oriented, appears well and in no acute distress  HEENT: atraumatic, conjunttiva clear, no obvious abnormalities on inspection of external nose and ears  NECK: normal movements of the head and neck  LUNGS: on inspection no signs of respiratory distress, breathing rate appears normal, no obvious gross SOB, gasping or wheezing  CV: no obvious cyanosis  MS: moves all visible extremities without noticeable abnormality  PSYCH/NEURO: pleasant and cooperative, no obvious depression or anxiety, speech and thought processing grossly  intact  ASSESSMENT AND PLAN:  Discussed the following assessment and plan:  Uncontrolled type 2 diabetes with stage 3 chronic kidney disease GFR 30-59 (HCC)  Uncontrolled type 2 diabetes with stage 3 chronic kidney disease GFR 30-59 (HCC) Quite uncontrolled with a reported A1c of 12.  He does note that his physician at the [place] did not change his regimen.  He was to be set up with our clinical pharmacist though this does not appear to have happened.  His blood sugar appears to have come down with cutting out sugary sodas.  I agree with rechecking labs through his cardiologist in 1 to 2 weeks.  The patient will ask that those lab results be sent to [place] as well.  We will get him set up with our clinical pharmacist for help in managing his diabetes particularly given that he is on such a high dose of NovoLog 70/30.  He was given precautions regarding reasons to seek medical attention with his blood sugar.    I discussed the assessment and treatment plan with the patient. The patient was provided an opportunity to ask questions and all were answered. The patient agreed with the plan and demonstrated an understanding of the instructions.   CMA will get the patient scheduled with our clinical pharmacist sometime in the next month.  The patient was advised to call back or seek an in-person evaluation if the symptoms worsen or if the condition fails to improve as anticipated.   Tommi Rumps, MD

## 2018-06-13 ENCOUNTER — Encounter: Payer: Self-pay | Admitting: Cardiology

## 2018-06-13 ENCOUNTER — Telehealth: Payer: Self-pay | Admitting: Family Medicine

## 2018-06-13 DIAGNOSIS — Z006 Encounter for examination for normal comparison and control in clinical research program: Secondary | ICD-10-CM | POA: Insufficient documentation

## 2018-06-13 NOTE — Assessment & Plan Note (Signed)
Quite uncontrolled with a reported A1c of 12.  He does note that his physician at the [place] did not change his regimen.  He was to be set up with our clinical pharmacist though this does not appear to have happened.  His blood sugar appears to have come down with cutting out sugary sodas.  I agree with rechecking labs through his cardiologist in 1 to 2 weeks.  The patient will ask that those lab results be sent to [place] as well.  We will get him set up with our clinical pharmacist for help in managing his diabetes particularly given that he is on such a high dose of NovoLog 70/30.  He was given precautions regarding reasons to seek medical attention with his blood sugar.

## 2018-06-13 NOTE — Telephone Encounter (Signed)
Please call the patient and get him scheduled with the clinical pharmacist sometime in the next month for his diabetes.  Thanks.

## 2018-06-14 NOTE — Telephone Encounter (Signed)
Called pt and left a VM to call back to be scheduled for ana ppt.

## 2018-06-16 NOTE — Telephone Encounter (Signed)
Called pt and left a VM to call back.  

## 2018-06-25 DIAGNOSIS — Z95810 Presence of automatic (implantable) cardiac defibrillator: Secondary | ICD-10-CM | POA: Diagnosis not present

## 2018-06-25 DIAGNOSIS — I5042 Chronic combined systolic (congestive) and diastolic (congestive) heart failure: Secondary | ICD-10-CM

## 2018-06-25 DIAGNOSIS — Z4502 Encounter for adjustment and management of automatic implantable cardiac defibrillator: Secondary | ICD-10-CM

## 2018-07-05 ENCOUNTER — Encounter: Payer: Self-pay | Admitting: Cardiology

## 2018-07-08 DIAGNOSIS — I5043 Acute on chronic combined systolic (congestive) and diastolic (congestive) heart failure: Secondary | ICD-10-CM | POA: Diagnosis not present

## 2018-07-11 ENCOUNTER — Ambulatory Visit: Payer: Medicare Other | Admitting: Gastroenterology

## 2018-07-12 NOTE — Progress Notes (Signed)
TELEHEALTH VISIT  Referring Provider: [name], MD Primary Care Physician:  [name], MD   Tele-visit due to COVID-19 pandemic Patient requested visit virtually, consented to the virtual encounter via audio enabled telemedicine application (D[employer]) Contact made at: 09:30 07/13/18 Patient verified by name and date of birth Location of patient: Home Location provider:  medical office Names of persons participating: Me, patient, Tinnie Gens CMA Time spent on telehealth visit: 20 minutes I discussed the limitations of evaluation and management by telemedicine. The patient expressed understanding and agreed to proceed.  Reason for Consultation:  Referred for colon cancer screening   IMPRESSION:  Need for colon cancer screening On Brilinta Chronic constipation attributed to medication side-effects Normal colonoscopy 05/17/08 No known family history of colon cancer or polyps   He is due colonoscopy for colon cancer screening.  I have recommended holding Brilinta for 7 days before endoscopy.  I discussed with the patient that there is a low, but real, risk of a cardiovascular event such as heart attack, stroke, or embolism/thrombosis while off Brilinta. Will communicate by phone or EMR with patient's prescribing provider to confirm that holding the Brilinta is appropriate at this time.   Constipation has been attributed to medications. The patient does not feel that further evaluation or treatment is warranted at this time.    PLAN: Colonoscopy after a Brilinta washout if approved by Dr. Einar Gip Follow a high fiber diet with fruits, vegetables, and bran fibers Consider a daily addition of psyllium for stool bulking  I consented the patient discussing the risks, benefits, and alternatives to endoscopic evaluation. In particular, we discussed the risks that include, but are not limited to, reaction to medication, cardiopulmonary compromise, bleeding requiring  blood transfusion, aspiration resulting in pneumonia, perforation requiring surgery, lack of diagnosis, severe illness requiring hospitalization, and even death. We reviewed the risk of missed lesion including polyps or even cancer. The patient acknowledges these risks and asks that we proceed.  HPI: Martin Bush is a 63 y.o. male referred by the [facility] for colon cancer screening. He has a history of diabetes, hypertension, hyperlipidemia, vitamin D deficiency, sleep apnea on CPAP, CHF-AICD, CAD s.p 2 vessel CABG followed by cardiac PCI x 10, carotid artery stenosis, who is on chronic antiplatelet therapy with Brilinta managed by Dr. Einar Gip. There is no history of occult or overt GI blood loss.   Colonoscopy 05/17/08 for rectal bleeding used conscious sedation and revealed a normal colonoscopy. Colonoscopy recommended in 10 years.   He has lost 60 pounds and then regained 10 pounds since February 2019 after losing his teeth and changing his eating habits.   Intermittent constipation that he attributes to his medications. He does not feel that the symptoms warrant investigation or treatment. There is no dysphagia, odynophagia, regurgitation, heartburn, nausea, abdominal pain, change in bowel habits, melena, hematochezia, or bright red blood per rectum. There is no anorexia.   Mother with uterine cancer. No known family history of colon cancer or polyps. No other family history of uterine/endometrial cancer, pancreatic cancer or gastric/stomach cancer.  Past Medical History:  Diagnosis Date  . AICD (automatic cardioverter/defibrillator) present 2016  . Arthritis    "thumbs"  (08/02/2017)  . CHF (congestive heart failure) ([place])   . Colon polyps   . Coronary artery disease   . Depression   . High cholesterol   . Hypertension   . MI (myocardial infarction) ([place]) 2003  . OSA on CPAP   . Pneumonia 10/2014  .  Proteinuria   . Type II diabetes mellitus (HCC)    insulin dependent    Past Surgical  History:  Procedure Laterality Date  . CARDIAC CATHETERIZATION N/A 07/16/2014   Procedure: Left Heart Cath and Coronary Angiography;  Surgeon: Adrian Prows, MD;  Location: [place] [facility];  Service: Cardiovascular;  Laterality: N/A;  . CARDIAC CATHETERIZATION  07/16/2014   Procedure: Coronary Balloon Angioplasty;  Surgeon: Adrian Prows, MD;  Location: [place] [facility];  Service: Cardiovascular;;  . CARDIAC CATHETERIZATION  2003  . CARDIAC DEFIBRILLATOR PLACEMENT  2016  . CATARACT EXTRACTION W/ INTRAOCULAR LENS IMPLANT Left 03/2014  . CORONARY ANGIOPLASTY WITH STENT PLACEMENT     "I've got a total of 8 stents in there; mostly doine at [facility]" (08/02/2017)  . CORONARY ARTERY BYPASS GRAFT  ~ 2003   "CABG X2"; [facility]  . CORONARY ATHERECTOMY N/A 08/16/2017   Procedure: CORONARY ATHERECTOMY;  Surgeon: Nigel Mormon, MD;  Location: [place] [facility];  Service: Cardiovascular;  Laterality: N/A;  . CORONARY BALLOON ANGIOPLASTY N/A 08/04/2017   Procedure: CORONARY BALLOON ANGIOPLASTY;  Surgeon: Adrian Prows, MD;  Location: [place] [facility];  Service: Cardiovascular;  Laterality: N/A;  . CORONARY STENT INTERVENTION N/A 08/16/2017   Procedure: CORONARY STENT INTERVENTION;  Surgeon: Nigel Mormon, MD;  Location: [place] [facility];  Service: Cardiovascular;  Laterality: N/A;  . ELBOW SURGERY Left ?2001   "pinched nerve"  . LEFT HEART CATH AND CORS/GRAFTS ANGIOGRAPHY N/A 08/04/2017   Procedure: LEFT HEART CATH AND CORS/GRAFTS ANGIOGRAPHY;  Surgeon: Adrian Prows, MD;  Location: [place] [facility];  Service: Cardiovascular;  Laterality: N/A;  . LEFT HEART CATH AND CORS/GRAFTS ANGIOGRAPHY N/A 08/20/2017   Procedure: LEFT HEART CATH AND CORS/GRAFTS ANGIOGRAPHY;  Surgeon: Nigel Mormon, MD;  Location: [place] [facility];  Service: Cardiovascular;  Laterality: N/A;    Current Outpatient Medications  Medication Sig Dispense Refill  . amiodarone (PACERONE) 200 MG tablet Take 200 mg by  mouth daily.     [name] Kitchen aspirin EC 81 MG tablet Take 81 mg by mouth daily.    . carvedilol (COREG) 12.5 MG tablet Take 6.25 mg by mouth 2 (two) times daily with a meal.    . Cholecalciferol (VITAMIN D) 2000 units tablet Take 2,000 Units by mouth 2 (two) times daily.    . empagliflozin (JARDIANCE) 25 MG TABS tablet Take 25 mg by mouth daily.    . hydrALAZINE (APRESOLINE) 25 MG tablet Take 25 mg by mouth 2 (two) times daily.    . hydrocortisone 2.5 % cream Apply topically 2 (two) times daily. Right back for itching (Patient taking differently: Apply 1 application topically 2 (two) times daily as needed (itching). Right back for itching) 30 g 1  . insulin aspart protamine - aspart (NOVOLOG MIX 70/30 FLEXPEN) (70-30) 100 UNIT/ML FlexPen Inject 60 Units into the skin 2 (two) times daily as needed (CBG >150).    . isosorbide mononitrate (IMDUR) 30 MG 24 hr tablet Take 30 mg by mouth daily.    [name] Kitchen liraglutide (VICTOZA) 18 MG/3ML SOPN Inject 1.8 mg into the skin daily.    . Meloxicam (MOBIC PO) Take 1 tablet by mouth daily as needed (severe back pain).    . metFORMIN (GLUCOPHAGE) 1000 MG tablet Take 1 tablet (1,000 mg total) by mouth 2 (two) times daily with a meal.    . metolazone (ZAROXOLYN) 2.5 MG tablet Take 2.5 mg by mouth daily as needed (for >2 lb weight  gain).    . mupirocin ointment (BACTROBAN) 2 % Apply 1 application topically 2 (two) times daily. To right back 30 g 0  . Omega-3 Fatty Acids (FISH OIL) 1000 MG CAPS Take 2,000 mg by mouth 3 (three) times daily.    . potassium chloride SA (K-DUR,KLOR-CON) 20 MEQ tablet Take 1 tablet (20 mEq total) by mouth 2 (two) times daily. 60 tablet 3  . pravastatin (PRAVACHOL) 80 MG tablet Take 80 mg by mouth at bedtime.     [name] Kitchen PRESCRIPTION MEDICATION Inhale into the lungs at bedtime. CPAP    . sacubitril-valsartan (ENTRESTO) 97-103 MG Take 1 tablet by mouth 2 (two) times daily.    . sertraline (ZOLOFT) 100 MG tablet Take 50 mg by mouth at bedtime.     . ticagrelor  (BRILINTA) 90 MG TABS tablet Take 1 tablet (90 mg total) by mouth 2 (two) times daily. 60 tablet 3  . torsemide (DEMADEX) 20 MG tablet TAKE 1 TABLET BY MOUTH TWICE A DAY 60 tablet 3   No current facility-administered medications for this visit.     Allergies as of 07/13/2018 - Review Complete 06/13/2018  Allergen Reaction Noted  . Diltiazem Rash 08/04/2016    Family History  Problem Relation Age of Onset  . Uterine cancer Mother   . Lung cancer Mother   . Hyperlipidemia Father   . Heart disease Father   . Hypertension Father   . Diabetes Father     Social History   Socioeconomic History  . Marital status: Married    Spouse name: Not on file  . Number of children: Not on file  . Years of education: Not on file  . Highest education level: Not on file  Occupational History  . Not on file  Social Needs  . Financial resource strain: Not hard at all  . Food insecurity    Worry: Never true    Inability: Never true  . Transportation needs    Medical: No    Non-medical: No  Tobacco Use  . Smoking status: Never Smoker  . Smokeless tobacco: Former Network engineer and Sexual Activity  . Alcohol use: Yes    Comment: 08/02/2017 "maybe 1 drink/year"  . Drug use: Never  . Sexual activity: Not Currently  Lifestyle  . Physical activity    Days per week: 0 days    Minutes per session: Not on file  . Stress: Not at all  Relationships  . Social Herbalist on phone: Twice a week    Gets together: Twice a week    Attends religious service: More than 4 times per year    Active member of club or organization: Yes    Attends meetings of clubs or organizations: Never    Relationship status: Married  . Intimate partner violence    Fear of current or ex partner: No    Emotionally abused: No    Physically abused: No    Forced sexual activity: No  Other Topics Concern  . Not on file  Social History Narrative  . Not on file    Review of Systems: ALL ROS discussed and  all others negative except listed in HPI.  Physical Exam: Complete physical exam not performed due to the limits inherent in a telehealth encounter.  General: Awake, alert, and oriented, and well communicative. In no acute distress.  HEENT: EOMI, non-icteric sclera, NCAT, MMM  Neck: Normal movement of head and neck  Pulm: No labored breathing, speaking  in full sentences without conversational dyspnea  Derm: No apparent lesions or bruising in visible field  MS: Moves all visible extremities without noticeable abnormality  Psych: Pleasant, cooperative, normal speech, normal affect and normal insight Neuro: Alert and appropriate   Stefanie Hodgens L. Tarri Glenn, MD, MPH [facility] 07/12/2018, 7:32 AM

## 2018-07-13 ENCOUNTER — Ambulatory Visit (INDEPENDENT_AMBULATORY_CARE_PROVIDER_SITE_OTHER): Payer: Medicare Other | Admitting: Gastroenterology

## 2018-07-13 ENCOUNTER — Encounter: Payer: Self-pay | Admitting: Gastroenterology

## 2018-07-13 VITALS — Ht 67.0 in | Wt 216.0 lb

## 2018-07-13 DIAGNOSIS — Z1211 Encounter for screening for malignant neoplasm of colon: Secondary | ICD-10-CM

## 2018-07-13 DIAGNOSIS — Z7902 Long term (current) use of antithrombotics/antiplatelets: Secondary | ICD-10-CM

## 2018-07-13 DIAGNOSIS — K5909 Other constipation: Secondary | ICD-10-CM

## 2018-07-17 ENCOUNTER — Telehealth: Payer: Self-pay | Admitting: Gastroenterology

## 2018-07-17 ENCOUNTER — Telehealth: Payer: Self-pay | Admitting: Emergency Medicine

## 2018-07-17 NOTE — Telephone Encounter (Signed)
Patient called to scheduled colonoscopy he is scheduled for 08-24-18 2 1:30pm  Dr. Einar Gip is the doctor that prescribed the BLD thinner.

## 2018-07-18 NOTE — Telephone Encounter (Signed)
Anticoag Letter sent to Dr. Einar Gip and patient mailed instructions.

## 2018-07-18 NOTE — Telephone Encounter (Signed)
   Martin Bush 11-06-1955 [id number]  Dear Dr. Einar Gip:  We have scheduled the above named patient for an colonoscopy procedure. Our records show that he is on anticoagulation therapy.  Please advise as to whether the patient may come off their therapy of Brillinta 7 days prior to their procedure which is scheduled for 08-24-2018.  Please route your response to Tinnie Gens, CMA or fax response to [phone].  Sincerely,    [facility]

## 2018-07-19 NOTE — Telephone Encounter (Signed)
He can hold Brilinta for 7 days and go through the procedure. He can also discontinue Brilinta after the procedure as it is one year since angioplasty.  Thanks

## 2018-07-20 NOTE — Telephone Encounter (Signed)
Left detailed message instructing patient to d/c brillinta on 08/16/2018 and to call back to ensure he got the message.

## 2018-07-26 DIAGNOSIS — Z4502 Encounter for adjustment and management of automatic implantable cardiac defibrillator: Secondary | ICD-10-CM

## 2018-07-26 DIAGNOSIS — Z95810 Presence of automatic (implantable) cardiac defibrillator: Secondary | ICD-10-CM | POA: Diagnosis not present

## 2018-07-26 DIAGNOSIS — I5042 Chronic combined systolic (congestive) and diastolic (congestive) heart failure: Secondary | ICD-10-CM

## 2018-08-06 NOTE — Telephone Encounter (Signed)
Patient aware of ICD transmission

## 2018-08-07 ENCOUNTER — Other Ambulatory Visit: Payer: Self-pay

## 2018-08-07 DIAGNOSIS — I5043 Acute on chronic combined systolic (congestive) and diastolic (congestive) heart failure: Secondary | ICD-10-CM | POA: Diagnosis not present

## 2018-08-08 ENCOUNTER — Ambulatory Visit (INDEPENDENT_AMBULATORY_CARE_PROVIDER_SITE_OTHER): Payer: Medicare Other | Admitting: Family Medicine

## 2018-08-08 DIAGNOSIS — G4733 Obstructive sleep apnea (adult) (pediatric): Secondary | ICD-10-CM

## 2018-08-08 DIAGNOSIS — N183 Chronic kidney disease, stage 3 unspecified: Secondary | ICD-10-CM

## 2018-08-08 DIAGNOSIS — I5022 Chronic systolic (congestive) heart failure: Secondary | ICD-10-CM

## 2018-08-08 DIAGNOSIS — E785 Hyperlipidemia, unspecified: Secondary | ICD-10-CM | POA: Diagnosis not present

## 2018-08-08 DIAGNOSIS — IMO0002 Reserved for concepts with insufficient information to code with codable children: Secondary | ICD-10-CM

## 2018-08-08 DIAGNOSIS — E1122 Type 2 diabetes mellitus with diabetic chronic kidney disease: Secondary | ICD-10-CM

## 2018-08-08 DIAGNOSIS — D649 Anemia, unspecified: Secondary | ICD-10-CM | POA: Diagnosis not present

## 2018-08-08 DIAGNOSIS — Z9989 Dependence on other enabling machines and devices: Secondary | ICD-10-CM

## 2018-08-08 DIAGNOSIS — E1165 Type 2 diabetes mellitus with hyperglycemia: Secondary | ICD-10-CM

## 2018-08-08 NOTE — Progress Notes (Signed)
Virtual Visit via video Note  This visit type was conducted due to national recommendations for restrictions regarding the COVID-19 pandemic (e.g. social distancing).  This format is felt to be most appropriate for this patient at this time.  All issues noted in this document were discussed and addressed.  No physical exam was performed (except for noted visual exam findings with Video Visits).   I connected with Martin Bush today at 10:30 AM EDT by a video enabled telemedicine application and verified that I am speaking with the correct person using two identifiers. Location patient: enclosed private space at [employer] Location provider: work or home office Persons participating in the virtual visit: patient, provider  I discussed the limitations, risks, security and privacy concerns of performing an evaluation and management service by telephone and the availability of in person appointments. I also discussed with the patient that there may be a patient responsible charge related to this service. The patient expressed understanding and agreed to proceed.   Reason for visit: follow-up  HPI: DIABETES Disease Monitoring: Blood Sugar ranges-102-220. Polyuria/phagia/dipsia-no.      Optho-saw them last month. Medications: Compliance-taking NovoLog 70/30 60 units once daily and Jardiance and Victoza. Hypoglycemic symptoms-no Patient thinks his last A1c was greater than 3 months ago.  CHF: Patient reports he is doing well with this.  No chest pain, shortness of breath, edema, orthopnea, or PND.  He has not gained greater than 3 pounds in the last several weeks.  He continues on carvedilol, hydralazine, Imdur, Entresto, and torsemide.  OSA: Using his CPAP nightly for 6 to 7 hours.  No hypersomnia.  He does wake well rested.    ROS: See pertinent positives and negatives per HPI.  Past Medical History:  Diagnosis Date  . AICD (automatic cardioverter/defibrillator) present 2016  .  Arthritis    "thumbs"  (08/02/2017)  . CHF (congestive heart failure) ([place])   . Colon polyps   . Coronary artery disease   . Depression   . High cholesterol   . Hypertension   . MI (myocardial infarction) ([place]) 2003  . OSA on CPAP   . Pneumonia 10/2014  . Proteinuria   . Type II diabetes mellitus (HCC)    insulin dependent    Past Surgical History:  Procedure Laterality Date  . CARDIAC CATHETERIZATION N/A 07/16/2014   Procedure: Left Heart Cath and Coronary Angiography;  Surgeon: Adrian Prows, MD;  Location: [place] [facility];  Service: Cardiovascular;  Laterality: N/A;  . CARDIAC CATHETERIZATION  07/16/2014   Procedure: Coronary Balloon Angioplasty;  Surgeon: Adrian Prows, MD;  Location: [place] [facility];  Service: Cardiovascular;;  . CARDIAC CATHETERIZATION  2003  . CARDIAC DEFIBRILLATOR PLACEMENT  2016  . CATARACT EXTRACTION W/ INTRAOCULAR LENS IMPLANT Left 03/2014  . CORONARY ANGIOPLASTY WITH STENT PLACEMENT     "I've got a total of 8 stents in there; mostly doine at [facility]" (08/02/2017)  . CORONARY ARTERY BYPASS GRAFT  ~ 2003   "CABG X2"; [facility]  . CORONARY ATHERECTOMY N/A 08/16/2017   Procedure: CORONARY ATHERECTOMY;  Surgeon: Nigel Mormon, MD;  Location: [place] [facility];  Service: Cardiovascular;  Laterality: N/A;  . CORONARY BALLOON ANGIOPLASTY N/A 08/04/2017   Procedure: CORONARY BALLOON ANGIOPLASTY;  Surgeon: Adrian Prows, MD;  Location: [place] [facility];  Service: Cardiovascular;  Laterality: N/A;  . CORONARY STENT INTERVENTION N/A 08/16/2017   Procedure: CORONARY STENT INTERVENTION;  Surgeon: Nigel Mormon, MD;  Location: [place] [facility];  Service: Cardiovascular;  Laterality: N/A;  . ELBOW SURGERY Left ?2001   "pinched nerve"  . LEFT HEART CATH AND CORS/GRAFTS ANGIOGRAPHY N/A 08/04/2017   Procedure: LEFT HEART CATH AND CORS/GRAFTS ANGIOGRAPHY;  Surgeon: Adrian Prows, MD;  Location: [place] [facility];  Service: Cardiovascular;  Laterality: N/A;   . LEFT HEART CATH AND CORS/GRAFTS ANGIOGRAPHY N/A 08/20/2017   Procedure: LEFT HEART CATH AND CORS/GRAFTS ANGIOGRAPHY;  Surgeon: Nigel Mormon, MD;  Location: [place] [facility];  Service: Cardiovascular;  Laterality: N/A;    Family History  Problem Relation Age of Onset  . Uterine cancer Mother   . Lung cancer Mother   . Hyperlipidemia Father   . Heart disease Father   . Hypertension Father   . Diabetes Father     SOCIAL HX: Non-smoker.  Formerly used smokeless tobacco.   Current Outpatient Medications:  .  amiodarone (PACERONE) 200 MG tablet, Take 200 mg by mouth daily. , Disp: , Rfl:  .  aspirin EC 81 MG tablet, Take 81 mg by mouth daily., Disp: , Rfl:  .  carvedilol (COREG) 12.5 MG tablet, Take 6.25 mg by mouth 2 (two) times daily with a meal., Disp: , Rfl:  .  Cholecalciferol (VITAMIN D) 2000 units tablet, Take 2,000 Units by mouth 2 (two) times daily., Disp: , Rfl:  .  empagliflozin (JARDIANCE) 25 MG TABS tablet, Take 25 mg by mouth daily., Disp: , Rfl:  .  hydrALAZINE (APRESOLINE) 25 MG tablet, Take 25 mg by mouth 2 (two) times daily., Disp: , Rfl:  .  hydrocortisone 2.5 % cream, Apply topically 2 (two) times daily. Right back for itching (Patient taking differently: Apply 1 application topically 2 (two) times daily as needed (itching). Right back for itching), Disp: 30 g, Rfl: 1 .  insulin aspart protamine - aspart (NOVOLOG MIX 70/30 FLEXPEN) (70-30) 100 UNIT/ML FlexPen, Inject 60 Units into the skin 2 (two) times daily as needed (CBG >150)., Disp: , Rfl:  .  isosorbide mononitrate (IMDUR) 30 MG 24 hr tablet, Take 30 mg by mouth daily., Disp: , Rfl:  .  liraglutide (VICTOZA) 18 MG/3ML SOPN, Inject 1.8 mg into the skin daily., Disp: , Rfl:  .  Meloxicam (MOBIC PO), Take 1 tablet by mouth daily as needed (severe back pain)., Disp: , Rfl:  .  metFORMIN (GLUCOPHAGE) 1000 MG tablet, Take 1 tablet (1,000 mg total) by mouth 2 (two) times daily with a meal., Disp: , Rfl:  .   metolazone (ZAROXOLYN) 2.5 MG tablet, Take 2.5 mg by mouth daily as needed (for >2 lb weight gain)., Disp: , Rfl:  .  Omega-3 Fatty Acids (FISH OIL) 1000 MG CAPS, Take 2,000 mg by mouth 3 (three) times daily., Disp: , Rfl:  .  potassium chloride SA (K-DUR,KLOR-CON) 20 MEQ tablet, Take 1 tablet (20 mEq total) by mouth 2 (two) times daily., Disp: 60 tablet, Rfl: 3 .  pravastatin (PRAVACHOL) 80 MG tablet, Take 80 mg by mouth at bedtime. , Disp: , Rfl:  .  PRESCRIPTION MEDICATION, Inhale into the lungs at bedtime. CPAP, Disp: , Rfl:  .  sacubitril-valsartan (ENTRESTO) 97-103 MG, Take 1 tablet by mouth 2 (two) times daily. , Disp: , Rfl:  .  sertraline (ZOLOFT) 100 MG tablet, Take 50 mg by mouth at bedtime. , Disp: , Rfl:  .  ticagrelor (BRILINTA) 90 MG TABS tablet, Take 1 tablet (90 mg total) by mouth 2 (two) times daily., Disp: 60 tablet, Rfl: 3 .  torsemide (  DEMADEX) 20 MG tablet, TAKE 1 TABLET BY MOUTH TWICE A DAY, Disp: 60 tablet, Rfl: 3  EXAM:  VITALS per patient if applicable: None.  GENERAL: alert, oriented, appears well and in no acute distress  HEENT: atraumatic, conjunttiva clear, no obvious abnormalities on inspection of external nose and ears  NECK: normal movements of the head and neck  LUNGS: on inspection no signs of respiratory distress, breathing rate appears normal, no obvious gross SOB, gasping or wheezing  CV: no obvious cyanosis  MS: moves all visible extremities without noticeable abnormality  PSYCH/NEURO: pleasant and cooperative, no obvious depression or anxiety, speech and thought processing grossly intact  ASSESSMENT AND PLAN:  Discussed the following assessment and plan:  CHF (congestive heart failure) (HCC) Asymptomatic.  He will continue to follow with cardiology.  OSA on CPAP Continue CPAP.  Patient is compliant with this.  Uncontrolled type 2 diabetes with stage 3 chronic kidney disease GFR 30-59 (HCC) He will continue his current regimen.  We will  check lab work and he will go to the lab at the hospital to have this completed.   Social distancing precautions and sick precautions given regarding COVID-19.   I discussed the assessment and treatment plan with the patient. The patient was provided an opportunity to ask questions and all were answered. The patient agreed with the plan and demonstrated an understanding of the instructions.   The patient was advised to call back or seek an in-person evaluation if the symptoms worsen or if the condition fails to improve as anticipated.   Tommi Rumps, MD

## 2018-08-10 NOTE — Assessment & Plan Note (Signed)
He will continue his current regimen.  We will check lab work and he will go to the lab at the hospital to have this completed.

## 2018-08-10 NOTE — Assessment & Plan Note (Signed)
Continue CPAP.  Patient is compliant with this.

## 2018-08-10 NOTE — Assessment & Plan Note (Signed)
Asymptomatic.  He will continue to follow with cardiology.

## 2018-08-18 ENCOUNTER — Encounter: Payer: Self-pay | Admitting: *Deleted

## 2018-08-18 ENCOUNTER — Telehealth: Payer: Self-pay | Admitting: *Deleted

## 2018-08-18 ENCOUNTER — Other Ambulatory Visit: Payer: Self-pay | Admitting: *Deleted

## 2018-08-18 DIAGNOSIS — Z1211 Encounter for screening for malignant neoplasm of colon: Secondary | ICD-10-CM

## 2018-08-18 NOTE — Telephone Encounter (Signed)
This RN received a call from Osvaldo Angst (anesthesia) who reported that due to the patient cardiology medical history, he would not be a safe candidate for a colonoscopy in the [facility] and would need to be moved to the hospital. This patient has been scheduled for a hospital colonoscopy with Dr. Tarri Glenn on 09/19/2018 at 1:15 pm. Case number: [id number]. COVID screening scheduled on 09/15/2018 at 9:40 am. Appointment with pre-visit nurse scheduled on 08/30/2018 at 11:00 am. This has been sent to the patient via a letter in the mail. Attempted to call the patient to update the patient of the changes, no answer. Left message for the patient to call back.

## 2018-08-21 ENCOUNTER — Telehealth: Payer: Self-pay | Admitting: *Deleted

## 2018-08-21 NOTE — Telephone Encounter (Signed)
Spoke to the patient and his wife and confirmed all dates and times of upcoming appointments. Patient and wife verbalized understanding. Nothing further at the time of the call.

## 2018-08-24 ENCOUNTER — Encounter: Payer: Medicare Other | Admitting: Gastroenterology

## 2018-08-26 DIAGNOSIS — I5042 Chronic combined systolic (congestive) and diastolic (congestive) heart failure: Secondary | ICD-10-CM | POA: Diagnosis not present

## 2018-08-26 DIAGNOSIS — Z4502 Encounter for adjustment and management of automatic implantable cardiac defibrillator: Secondary | ICD-10-CM

## 2018-08-26 DIAGNOSIS — Z95810 Presence of automatic (implantable) cardiac defibrillator: Secondary | ICD-10-CM | POA: Diagnosis not present

## 2018-08-30 ENCOUNTER — Encounter: Payer: Self-pay | Admitting: Family Medicine

## 2018-08-30 ENCOUNTER — Other Ambulatory Visit: Payer: Self-pay

## 2018-08-30 ENCOUNTER — Ambulatory Visit (INDEPENDENT_AMBULATORY_CARE_PROVIDER_SITE_OTHER): Payer: Medicare Other | Admitting: Family Medicine

## 2018-08-30 ENCOUNTER — Ambulatory Visit: Payer: Medicare Other | Admitting: *Deleted

## 2018-08-30 VITALS — Ht 67.0 in | Wt 220.0 lb

## 2018-08-30 DIAGNOSIS — E1122 Type 2 diabetes mellitus with diabetic chronic kidney disease: Secondary | ICD-10-CM

## 2018-08-30 DIAGNOSIS — G8929 Other chronic pain: Secondary | ICD-10-CM | POA: Diagnosis not present

## 2018-08-30 DIAGNOSIS — F325 Major depressive disorder, single episode, in full remission: Secondary | ICD-10-CM

## 2018-08-30 DIAGNOSIS — N183 Chronic kidney disease, stage 3 unspecified: Secondary | ICD-10-CM

## 2018-08-30 DIAGNOSIS — Z6834 Body mass index (BMI) 34.0-34.9, adult: Secondary | ICD-10-CM

## 2018-08-30 DIAGNOSIS — M545 Low back pain, unspecified: Secondary | ICD-10-CM

## 2018-08-30 DIAGNOSIS — Z1211 Encounter for screening for malignant neoplasm of colon: Secondary | ICD-10-CM

## 2018-08-30 DIAGNOSIS — IMO0002 Reserved for concepts with insufficient information to code with codable children: Secondary | ICD-10-CM

## 2018-08-30 DIAGNOSIS — E6609 Other obesity due to excess calories: Secondary | ICD-10-CM

## 2018-08-30 DIAGNOSIS — D649 Anemia, unspecified: Secondary | ICD-10-CM

## 2018-08-30 MED ORDER — PEG 3350-KCL-NA BICARB-NACL 420 G PO SOLR: 4000.0000 mL | Freq: Once | ORAL | 0 refills | Status: AC

## 2018-08-30 MED ORDER — SUPREP BOWEL PREP KIT 17.5-3.13-1.6 GM/177ML PO SOLN: 1.0000 | Freq: Once | ORAL | 0 refills | Status: AC

## 2018-08-30 NOTE — Progress Notes (Signed)
Virtual Visit via telephone Note  This visit type was conducted due to national recommendations for restrictions regarding the COVID-19 pandemic (e.g. social distancing).  This format is felt to be most appropriate for this patient at this time.  All issues noted in this document were discussed and addressed.  No physical exam was performed (except for noted visual exam findings with Video Visits).   I connected with Martin Bush today at  3:30 PM EDT by telephone and verified that I am speaking with the correct person using two identifiers. Location patient: home Location provider: work Persons participating in the virtual visit: patient, provider  I discussed the limitations, risks, security and privacy concerns of performing an evaluation and management service by telephone and the availability of in person appointments. I also discussed with the patient that there may be a patient responsible charge related to this service. The patient expressed understanding and agreed to proceed.  Interactive audio and video telecommunications were attempted between this provider and patient, however failed, due to patient having technical difficulties OR patient did not have access to video capability.  We continued and completed visit with audio only.   Reason for visit: Follow-up.  HPI: CKD stage III: He continues on meloxicam.  He takes this for chronic back pain.  Does not take any other anti-inflammatories.  He was unable to get lab work at his last visit.  He reports his cardiologist knows that he is on meloxicam.  Chronic back pain: Does take meloxicam for this.  He did see a chiropractor recently and notes he has no pain now.  He has not been exercising related to his back pain.  Depression: No depression or anxiety.  He continues on Zoloft.   ROS: See pertinent positives and negatives per HPI.  Past Medical History:  Diagnosis Date  . AICD (automatic cardioverter/defibrillator) present  2016  . Arthritis    "thumbs"  (08/02/2017)  . CHF (congestive heart failure) ([place])   . Colon polyps   . Coronary artery disease   . Depression   . High cholesterol   . Hypertension   . MI (myocardial infarction) ([place]) 2003  . OSA on CPAP   . Oxygen deficiency   . Pneumonia 10/2014  . Proteinuria   . Sleep apnea   . Type II diabetes mellitus (HCC)    insulin dependent    Past Surgical History:  Procedure Laterality Date  . CARDIAC CATHETERIZATION N/A 07/16/2014   Procedure: Left Heart Cath and Coronary Angiography;  Surgeon: Adrian Prows, MD;  Location: [place] [facility];  Service: Cardiovascular;  Laterality: N/A;  . CARDIAC CATHETERIZATION  07/16/2014   Procedure: Coronary Balloon Angioplasty;  Surgeon: Adrian Prows, MD;  Location: [place] [facility];  Service: Cardiovascular;;  . CARDIAC CATHETERIZATION  2003  . CARDIAC DEFIBRILLATOR PLACEMENT  2016  . CATARACT EXTRACTION W/ INTRAOCULAR LENS IMPLANT Left 03/2014  . COLONOSCOPY    . CORONARY ANGIOPLASTY WITH STENT PLACEMENT     "I've got a total of 8 stents in there; mostly doine at [facility]" (08/02/2017)  . CORONARY ARTERY BYPASS GRAFT  ~ 2003   "CABG X2"; [name]  . CORONARY ATHERECTOMY N/A 08/16/2017   Procedure: CORONARY ATHERECTOMY;  Surgeon: Nigel Mormon, MD;  Location: [place] [facility];  Service: Cardiovascular;  Laterality: N/A;  . CORONARY BALLOON ANGIOPLASTY N/A 08/04/2017   Procedure: CORONARY BALLOON ANGIOPLASTY;  Surgeon: Adrian Prows, MD;  Location: [place] [facility];  Service: Cardiovascular;  Laterality: N/A;  . CORONARY STENT INTERVENTION N/A 08/16/2017   Procedure: CORONARY STENT INTERVENTION;  Surgeon: Nigel Mormon, MD;  Location: [place] [facility];  Service: Cardiovascular;  Laterality: N/A;  . ELBOW SURGERY Left ?2001   "pinched nerve"  . LEFT HEART CATH AND CORS/GRAFTS ANGIOGRAPHY N/A 08/04/2017   Procedure: LEFT HEART CATH AND CORS/GRAFTS ANGIOGRAPHY;  Surgeon: Adrian Prows, MD;   Location: [place] [facility];  Service: Cardiovascular;  Laterality: N/A;  . LEFT HEART CATH AND CORS/GRAFTS ANGIOGRAPHY N/A 08/20/2017   Procedure: LEFT HEART CATH AND CORS/GRAFTS ANGIOGRAPHY;  Surgeon: Nigel Mormon, MD;  Location: [place] [facility];  Service: Cardiovascular;  Laterality: N/A;    Family History  Problem Relation Age of Onset  . Uterine cancer Mother   . Lung cancer Mother   . Hyperlipidemia Father   . Heart disease Father   . Hypertension Father   . Diabetes Father   . Colon cancer Neg Hx   . Esophageal cancer Neg Hx   . Colon polyps Neg Hx   . Rectal cancer Neg Hx   . Stomach cancer Neg Hx     SOCIAL HX: Non-smoker.   Current Outpatient Medications:  .  amiodarone (PACERONE) 200 MG tablet, Take 200 mg by mouth daily. , Disp: , Rfl:  .  aspirin EC 81 MG tablet, Take 81 mg by mouth daily., Disp: , Rfl:  .  carvedilol (COREG) 12.5 MG tablet, Take 6.25 mg by mouth 2 (two) times daily with a meal., Disp: , Rfl:  .  Cholecalciferol (VITAMIN D) 2000 units tablet, Take 2,000 Units by mouth 2 (two) times daily., Disp: , Rfl:  .  empagliflozin (JARDIANCE) 25 MG TABS tablet, Take 25 mg by mouth daily., Disp: , Rfl:  .  hydrALAZINE (APRESOLINE) 25 MG tablet, Take 25 mg by mouth 2 (two) times daily., Disp: , Rfl:  .  hydrocortisone 2.5 % cream, Apply topically 2 (two) times daily. Right back for itching (Patient taking differently: Apply 1 application topically 2 (two) times daily as needed (itching). Right back for itching), Disp: 30 g, Rfl: 1 .  insulin aspart protamine - aspart (NOVOLOG MIX 70/30 FLEXPEN) (70-30) 100 UNIT/ML FlexPen, Inject 60 Units into the skin 2 (two) times daily as needed (CBG >150)., Disp: , Rfl:  .  isosorbide mononitrate (IMDUR) 30 MG 24 hr tablet, Take 30 mg by mouth daily., Disp: , Rfl:  .  liraglutide (VICTOZA) 18 MG/3ML SOPN, Inject 1.8 mg into the skin daily., Disp: , Rfl:  .  Meloxicam (MOBIC PO), Take 1 tablet by mouth daily as needed  (severe back pain)., Disp: , Rfl:  .  metFORMIN (GLUCOPHAGE) 1000 MG tablet, Take 1 tablet (1,000 mg total) by mouth 2 (two) times daily with a meal., Disp: , Rfl:  .  metolazone (ZAROXOLYN) 2.5 MG tablet, Take 2.5 mg by mouth daily as needed (for >2 lb weight gain)., Disp: , Rfl:  .  Omega-3 Fatty Acids (FISH OIL) 1000 MG CAPS, Take 2,000 mg by mouth 3 (three) times daily., Disp: , Rfl:  .  polyethylene glycol-electrolytes (NULYTELY/GOLYTELY) 420 g solution, Take 4,000 mLs by mouth once for 1 dose., Disp: 4000 mL, Rfl: 0 .  potassium chloride SA (K-DUR,KLOR-CON) 20 MEQ tablet, Take 1 tablet (20 mEq total) by mouth 2 (two) times daily., Disp: 60 tablet, Rfl: 3 .  pravastatin (PRAVACHOL) 80 MG tablet, Take 80 mg by mouth at bedtime. , Disp: , Rfl:  .  PRESCRIPTION MEDICATION,  Inhale into the lungs at bedtime. CPAP, Disp: , Rfl:  .  sacubitril-valsartan (ENTRESTO) 97-103 MG, Take 1 tablet by mouth 2 (two) times daily. , Disp: , Rfl:  .  sertraline (ZOLOFT) 100 MG tablet, Take 50 mg by mouth at bedtime. , Disp: , Rfl:  .  SUPREP BOWEL PREP KIT 17.5-3.13-1.6 GM/177ML SOLN, Take 1 kit by mouth once for 1 dose., Disp: 354 mL, Rfl: 0 .  ticagrelor (BRILINTA) 90 MG TABS tablet, Take 1 tablet (90 mg total) by mouth 2 (two) times daily., Disp: 60 tablet, Rfl: 3 .  torsemide (DEMADEX) 20 MG tablet, TAKE 1 TABLET BY MOUTH TWICE A DAY, Disp: 60 tablet, Rfl: 3  EXAM: This was a telehealth telephone visit and thus no physical exam was completed.  ASSESSMENT AND PLAN:  Discussed the following assessment and plan:  Depression Asymptomatic.  He will continue Zoloft.  CKD (chronic kidney disease), stage III ([place]) We will have him come in for lab work.  We may need to consider whether or not he should continue meloxicam depending on his kidney function.  Discussed risk of kidney injury with this medication.  Chronic low back pain Asymptomatic currently.  He will monitor for recurrence.    I discussed the  assessment and treatment plan with the patient. The patient was provided an opportunity to ask questions and all were answered. The patient agreed with the plan and demonstrated an understanding of the instructions.   The patient was advised to call back or seek an in-person evaluation if the symptoms worsen or if the condition fails to improve as anticipated.  I provided 9 minutes of non-face-to-face time during this encounter.   Tommi Rumps, MD

## 2018-08-30 NOTE — Assessment & Plan Note (Signed)
Asymptomatic.  He will continue Zoloft.

## 2018-08-30 NOTE — Assessment & Plan Note (Signed)
Asymptomatic currently.  He will monitor for recurrence.

## 2018-08-30 NOTE — Progress Notes (Signed)
Previsit via telephone with ID per name, dob and address  No egg or soy allergy known to patient  No issues with past sedation with any surgeries  or procedures, no intubation problems  No diet pills per patient Has home 02 use per patient  Patient aware to stop Brilinta 7 days prior to procedure Pt has some  issues with constipation related to fluid pills Changed prep to golyte related to constipation and [employer] coverage, called pharmacy to inform No A fib or A flutter  EMMI information, consent , acknowledgement form with envelope for mailing,instructions sent to patient via mail

## 2018-08-30 NOTE — Assessment & Plan Note (Signed)
We will have him come in for lab work.  We may need to consider whether or not he should continue meloxicam depending on his kidney function.  Discussed risk of kidney injury with this medication.

## 2018-09-07 DIAGNOSIS — I5043 Acute on chronic combined systolic (congestive) and diastolic (congestive) heart failure: Secondary | ICD-10-CM | POA: Diagnosis not present

## 2018-09-15 ENCOUNTER — Other Ambulatory Visit (HOSPITAL_COMMUNITY)
Admission: RE | Admit: 2018-09-15 | Discharge: 2018-09-15 | Disposition: A | Discharge status: Home / Self Care | Payer: Medicare Other | Source: Ambulatory Visit | Attending: Gastroenterology | Admitting: Gastroenterology

## 2018-09-15 DIAGNOSIS — Z01812 Encounter for preprocedural laboratory examination: Secondary | ICD-10-CM | POA: Diagnosis not present

## 2018-09-15 DIAGNOSIS — Z20828 Contact with and (suspected) exposure to other viral communicable diseases: Secondary | ICD-10-CM | POA: Diagnosis not present

## 2018-09-15 LAB — SARS CORONAVIRUS 2 (TAT 6-24 HRS): SARS Coronavirus 2: NEGATIVE

## 2018-09-18 ENCOUNTER — Other Ambulatory Visit: Payer: Medicare Other

## 2018-09-18 NOTE — Progress Notes (Signed)
Spoke with patient, has quarantined without symptoms.  Answered questions regarding procedure 09/19/18.  Patient arrival time 1030am.

## 2018-09-18 NOTE — Progress Notes (Signed)
Endo pre-op attempted. No answer .

## 2018-09-19 ENCOUNTER — Ambulatory Visit (HOSPITAL_COMMUNITY): Payer: No Typology Code available for payment source | Admitting: Certified Registered"

## 2018-09-19 ENCOUNTER — Encounter (HOSPITAL_COMMUNITY)
Admission: RE | Disposition: A | Discharge status: Home / Self Care | Payer: Self-pay | Source: Home / Self Care | Attending: Gastroenterology

## 2018-09-19 ENCOUNTER — Other Ambulatory Visit: Payer: Self-pay

## 2018-09-19 ENCOUNTER — Encounter (HOSPITAL_COMMUNITY): Payer: Self-pay | Admitting: Gastroenterology

## 2018-09-19 ENCOUNTER — Ambulatory Visit (HOSPITAL_COMMUNITY)
Admission: RE | Admit: 2018-09-19 | Discharge: 2018-09-19 | Disposition: A | Discharge status: Home / Self Care | Payer: No Typology Code available for payment source | Attending: Gastroenterology | Admitting: Gastroenterology

## 2018-09-19 DIAGNOSIS — K5909 Other constipation: Secondary | ICD-10-CM | POA: Diagnosis not present

## 2018-09-19 DIAGNOSIS — K621 Rectal polyp: Secondary | ICD-10-CM | POA: Diagnosis not present

## 2018-09-19 DIAGNOSIS — I2510 Atherosclerotic heart disease of native coronary artery without angina pectoris: Secondary | ICD-10-CM | POA: Diagnosis not present

## 2018-09-19 DIAGNOSIS — I110 Hypertensive heart disease with heart failure: Secondary | ICD-10-CM | POA: Insufficient documentation

## 2018-09-19 DIAGNOSIS — Z1211 Encounter for screening for malignant neoplasm of colon: Secondary | ICD-10-CM

## 2018-09-19 DIAGNOSIS — I252 Old myocardial infarction: Secondary | ICD-10-CM | POA: Diagnosis not present

## 2018-09-19 DIAGNOSIS — M189 Osteoarthritis of first carpometacarpal joint, unspecified: Secondary | ICD-10-CM | POA: Insufficient documentation

## 2018-09-19 DIAGNOSIS — E119 Type 2 diabetes mellitus without complications: Secondary | ICD-10-CM | POA: Insufficient documentation

## 2018-09-19 DIAGNOSIS — E7800 Pure hypercholesterolemia, unspecified: Secondary | ICD-10-CM | POA: Insufficient documentation

## 2018-09-19 DIAGNOSIS — E559 Vitamin D deficiency, unspecified: Secondary | ICD-10-CM | POA: Diagnosis not present

## 2018-09-19 DIAGNOSIS — D375 Neoplasm of uncertain behavior of rectum: Secondary | ICD-10-CM | POA: Diagnosis not present

## 2018-09-19 DIAGNOSIS — D125 Benign neoplasm of sigmoid colon: Secondary | ICD-10-CM

## 2018-09-19 DIAGNOSIS — Z7982 Long term (current) use of aspirin: Secondary | ICD-10-CM | POA: Insufficient documentation

## 2018-09-19 DIAGNOSIS — K644 Residual hemorrhoidal skin tags: Secondary | ICD-10-CM | POA: Insufficient documentation

## 2018-09-19 DIAGNOSIS — Z7902 Long term (current) use of antithrombotics/antiplatelets: Secondary | ICD-10-CM | POA: Insufficient documentation

## 2018-09-19 DIAGNOSIS — Z79899 Other long term (current) drug therapy: Secondary | ICD-10-CM | POA: Diagnosis not present

## 2018-09-19 DIAGNOSIS — F329 Major depressive disorder, single episode, unspecified: Secondary | ICD-10-CM | POA: Diagnosis not present

## 2018-09-19 DIAGNOSIS — Z951 Presence of aortocoronary bypass graft: Secondary | ICD-10-CM | POA: Insufficient documentation

## 2018-09-19 DIAGNOSIS — I509 Heart failure, unspecified: Secondary | ICD-10-CM | POA: Insufficient documentation

## 2018-09-19 DIAGNOSIS — Z95810 Presence of automatic (implantable) cardiac defibrillator: Secondary | ICD-10-CM | POA: Diagnosis not present

## 2018-09-19 DIAGNOSIS — G4733 Obstructive sleep apnea (adult) (pediatric): Secondary | ICD-10-CM | POA: Insufficient documentation

## 2018-09-19 DIAGNOSIS — Z794 Long term (current) use of insulin: Secondary | ICD-10-CM | POA: Insufficient documentation

## 2018-09-19 DIAGNOSIS — I6529 Occlusion and stenosis of unspecified carotid artery: Secondary | ICD-10-CM | POA: Diagnosis not present

## 2018-09-19 HISTORY — PX: POLYPECTOMY: SHX5525

## 2018-09-19 HISTORY — PX: SUBMUCOSAL INJECTION: SHX5543

## 2018-09-19 HISTORY — PX: COLONOSCOPY WITH PROPOFOL: SHX5780

## 2018-09-19 HISTORY — PX: BIOPSY: SHX5522

## 2018-09-19 LAB — GLUCOSE, CAPILLARY: Glucose-Capillary: 114 mg/dL — ABNORMAL HIGH (ref 70–99)

## 2018-09-19 SURGERY — COLONOSCOPY WITH PROPOFOL
Anesthesia: Monitor Anesthesia Care

## 2018-09-19 MED ORDER — SPOT INK MARKER SYRINGE KIT
PACK | SUBMUCOSAL | Status: DC | PRN
  Administered 2018-09-19: 4 mL via SUBMUCOSAL

## 2018-09-19 MED ORDER — SODIUM CHLORIDE 0.9 % IV SOLN: INTRAVENOUS | Status: DC

## 2018-09-19 MED ORDER — PROPOFOL 10 MG/ML IV BOLUS
INTRAVENOUS | Status: AC
  Filled 2018-09-19: qty 60

## 2018-09-19 MED ORDER — LIDOCAINE 2% (20 MG/ML) 5 ML SYRINGE
INTRAMUSCULAR | Status: DC | PRN
  Administered 2018-09-19: 60 mg via INTRAVENOUS

## 2018-09-19 MED ORDER — LACTATED RINGERS IV SOLN
INTRAVENOUS | Status: DC
  Administered 2018-09-19 (×2): via INTRAVENOUS

## 2018-09-19 MED ORDER — EPHEDRINE SULFATE-NACL 50-0.9 MG/10ML-% IV SOSY
PREFILLED_SYRINGE | INTRAVENOUS | Status: DC | PRN
  Administered 2018-09-19: 5 mg via INTRAVENOUS
  Administered 2018-09-19: 10 mg via INTRAVENOUS
  Administered 2018-09-19: 5 mg via INTRAVENOUS
  Administered 2018-09-19 (×2): 10 mg via INTRAVENOUS

## 2018-09-19 MED ORDER — SPOT INK MARKER SYRINGE KIT
PACK | SUBMUCOSAL | Status: AC
  Filled 2018-09-19: qty 5

## 2018-09-19 MED ORDER — PROPOFOL 500 MG/50ML IV EMUL
INTRAVENOUS | Status: DC | PRN
  Administered 2018-09-19: 125 ug/kg/min via INTRAVENOUS

## 2018-09-19 SURGICAL SUPPLY — 21 items

## 2018-09-19 NOTE — Anesthesia Preprocedure Evaluation (Signed)
Anesthesia Evaluation  Patient identified by MRN, date of birth, ID band Patient awake    Reviewed: Allergy & Precautions, NPO status , Patient's Chart, lab work & pertinent test results  Airway Mallampati: II  TM Distance: >3 FB Neck ROM: Full    Dental  (+) Poor Dentition, Edentulous Upper,    Pulmonary sleep apnea and Continuous Positive Airway Pressure Ventilation ,    Pulmonary exam normal breath sounds clear to auscultation       Cardiovascular Exercise Tolerance: Good hypertension, Pt. on medications and Pt. on home beta blockers + CAD and +CHF  Normal cardiovascular exam+ Cardiac Defibrillator  Rhythm:Regular Rate:Normal  8/8 Echo Left ventricle: The cavity size was mildly dilated. Wall   thickness was normal. Systolic function was severely reduced. The   estimated ejection fraction was in the range of 20% to 25%.   Moderate global hypokinesis. Severe distal anteroseptal   hypokinesis. Severe inferolateral hypokinesis. Apical akinesis.   Features are consistent with a pseudonormal left ventricular   filling pattern, with concomitant abnormal relaxation and   increased filling pressure (grade 2 diastolic dysfunction).   Neuro/Psych PSYCHIATRIC DISORDERS Depression negative neurological ROS     GI/Hepatic   Endo/Other  diabetes, Oral Hypoglycemic Agents  Renal/GU Renal InsufficiencyRenal diseaseCr 1.5     Musculoskeletal   Abdominal   Peds  Hematology negative hematology ROS (+)   Anesthesia Other Findings   Reproductive/Obstetrics                           Lab Results  Component Value Date   WBC 7.3 10/26/2017   HGB 16.0 01/07/2018   HCT 47.0 01/07/2018   MCV 94.5 10/26/2017   PLT 215 10/26/2017    Lab Results  Component Value Date   CREATININE 1.50 (H) 03/06/2018   BUN 27 03/06/2018   NA 131 (L) 03/06/2018   K 4.9 03/06/2018   CL 95 (L) 03/06/2018   CO2 21 03/06/2018     Anesthesia Physical Anesthesia Plan  ASA: IV  Anesthesia Plan: MAC   Post-op Pain Management:    Induction:   PONV Risk Score and Plan:   Airway Management Planned: Natural Airway and Nasal Cannula  Additional Equipment:   Intra-op Plan:   Post-operative Plan:   Informed Consent:     Dental advisory given  Plan Discussed with:   Anesthesia Plan Comments:         Anesthesia Quick Evaluation

## 2018-09-19 NOTE — Anesthesia Procedure Notes (Signed)
Procedure Name: MAC Date/Time: 09/19/2018 11:50 AM Performed by: Eben Burow, CRNA Pre-anesthesia Checklist: Patient identified, Emergency Drugs available, Suction available, Patient being monitored and Timeout performed Oxygen Delivery Method: Simple face mask Dental Injury: Teeth and Oropharynx as per pre-operative assessment

## 2018-09-19 NOTE — Anesthesia Postprocedure Evaluation (Signed)
Anesthesia Post Note  Patient: Martin Bush  Procedure(s) Performed: COLONOSCOPY WITH PROPOFOL (N/A ) BIOPSY POLYPECTOMY SUBMUCOSAL INJECTION     Patient location during evaluation: Endoscopy Anesthesia Type: MAC Level of consciousness: awake and alert Pain management: pain level controlled Vital Signs Assessment: post-procedure vital signs reviewed and stable Respiratory status: spontaneous breathing, nonlabored ventilation, respiratory function stable and patient connected to nasal cannula oxygen Cardiovascular status: blood pressure returned to baseline and stable Postop Assessment: no apparent nausea or vomiting Anesthetic complications: no    Last Vitals:  Vitals:   09/19/18 1250 09/19/18 1300  BP: 139/68 (!) 156/72  Pulse: 77 75  Resp: 17 (!) 21  Temp:    SpO2: 98% 98%    Last Pain:  Vitals:   09/19/18 1300  TempSrc:   PainSc: 0-No pain                 Barnet Glasgow

## 2018-09-19 NOTE — H&P (Signed)
Referring Provider: No ref. provider found Primary Care Physician:  [name], MD  Reason for Consultation:  Referred for colon cancer screening   IMPRESSION:  Need for colon cancer screening On Brilinta Chronic constipation attributed to medication side-effects Normal colonoscopy 05/17/08 No known family history of colon cancer or polyps   He is due colonoscopy for colon cancer screening.  I have recommended holding Brilinta for 7 days before endoscopy.  I discussed with the patient that there is a low, but real, risk of a cardiovascular event such as heart attack, stroke, or embolism/thrombosis while off Brilinta. Will communicate by phone or EMR with patient's prescribing provider to confirm that holding the Brilinta is appropriate at this time.   Constipation has been attributed to medications. The patient does not feel that further evaluation or treatment is warranted at this time.    PLAN: Colonoscopy after a Brilinta washout if approved by Dr. Einar Gip Follow a high fiber diet with fruits, vegetables, and bran fibers Consider a daily addition of psyllium for stool bulking  I consented the patient discussing the risks, benefits, and alternatives to endoscopic evaluation. In particular, we discussed the risks that include, but are not limited to, reaction to medication, cardiopulmonary compromise, bleeding requiring blood transfusion, aspiration resulting in pneumonia, perforation requiring surgery, lack of diagnosis, severe illness requiring hospitalization, and even death. We reviewed the risk of missed lesion including polyps or even cancer. The patient acknowledges these risks and asks that we proceed.  HPI: Martin Bush is a 63 y.o. male referred by the [facility] for colon cancer screening. He has a history of diabetes, hypertension, hyperlipidemia, vitamin D deficiency, sleep apnea on CPAP, CHF-AICD, CAD s.p 2 vessel CABG followed by cardiac PCI x 10, carotid artery  stenosis, who is on chronic antiplatelet therapy with Brilinta managed by Dr. Einar Gip. There is no history of occult or overt GI blood loss.   Colonoscopy 05/17/08 for rectal bleeding used conscious sedation and revealed a normal colonoscopy. Colonoscopy recommended in 10 years.   He has lost 60 pounds and then regained 10 pounds since February 2019 after losing his teeth and changing his eating habits.   Intermittent constipation that he attributes to his medications. He does not feel that the symptoms warrant investigation or treatment. There is no dysphagia, odynophagia, regurgitation, heartburn, nausea, abdominal pain, change in bowel habits, melena, hematochezia, or bright red blood per rectum. There is no anorexia.   Mother with uterine cancer. No known family history of colon cancer or polyps. No other family history of uterine/endometrial cancer, pancreatic cancer or gastric/stomach cancer.  Past Medical History:  Diagnosis Date  . AICD (automatic cardioverter/defibrillator) present 2016  . Arthritis    "thumbs"  (08/02/2017)  . CHF (congestive heart failure) ([place])   . Colon polyps   . Coronary artery disease   . Depression   . High cholesterol   . Hypertension   . MI (myocardial infarction) ([place] 2003  . OSA on CPAP   . Oxygen deficiency   . Pneumonia 10/2014  . Proteinuria   . Sleep apnea   . Type II diabetes mellitus (H[facility])    insulin dependent    Past Surgical History:  Procedure Laterality Date  . CARDIAC CATHETERIZATION N/A 07/16/2014   Procedure: Left Heart Cath and Coronary Angiography;  Surgeon: Adrian Prows, MD;  Location: [place] [facility];  Service: Cardiovascular;  Laterality: N/A;  . CARDIAC CATHETERIZATION  07/16/2014   Procedure: Coronary Balloon Angioplasty;  Surgeon:  Adrian Prows, MD;  Location: [place] [facility];  Service: Cardiovascular;;  . CARDIAC CATHETERIZATION  2003  . CARDIAC DEFIBRILLATOR PLACEMENT  2016  . CATARACT EXTRACTION W/ INTRAOCULAR LENS IMPLANT  Left 03/2014  . COLONOSCOPY    . CORONARY ANGIOPLASTY WITH STENT PLACEMENT     "I've got a total of 8 stents in there; mostly doine at [facility]" (08/02/2017)  . CORONARY ARTERY BYPASS GRAFT  ~ 2003   "CABG X2"; [facility]  . CORONARY ATHERECTOMY N/A 08/16/2017   Procedure: CORONARY ATHERECTOMY;  Surgeon: Nigel Mormon, MD;  Location: [place] [facility];  Service: Cardiovascular;  Laterality: N/A;  . CORONARY BALLOON ANGIOPLASTY N/A 08/04/2017   Procedure: CORONARY BALLOON ANGIOPLASTY;  Surgeon: Adrian Prows, MD;  Location: [place] [facility];  Service: Cardiovascular;  Laterality: N/A;  . CORONARY STENT INTERVENTION N/A 08/16/2017   Procedure: CORONARY STENT INTERVENTION;  Surgeon: Nigel Mormon, MD;  Location: [place] [facility];  Service: Cardiovascular;  Laterality: N/A;  . ELBOW SURGERY Left ?2001   "pinched nerve"  . LEFT HEART CATH AND CORS/GRAFTS ANGIOGRAPHY N/A 08/04/2017   Procedure: LEFT HEART CATH AND CORS/GRAFTS ANGIOGRAPHY;  Surgeon: Adrian Prows, MD;  Location: [place] [facility];  Service: Cardiovascular;  Laterality: N/A;  . LEFT HEART CATH AND CORS/GRAFTS ANGIOGRAPHY N/A 08/20/2017   Procedure: LEFT HEART CATH AND CORS/GRAFTS ANGIOGRAPHY;  Surgeon: Nigel Mormon, MD;  Location: [facility];  Service: Cardiovascular;  Laterality: N/A;    Current Facility-Administered Medications  Medication Dose Route Frequency Provider Last Rate Last Dose  . lactated ringers infusion   Intravenous Continuous Thornton Park, MD 20 mL/hr at 09/19/18 1048      Allergies as of 08/18/2018 - Review Complete 08/08/2018  Allergen Reaction Noted  . Diltiazem Rash 08/04/2016    Family History  Problem Relation Age of Onset  . Uterine cancer Mother   . Lung cancer Mother   . Hyperlipidemia Father   . Heart disease Father   . Hypertension Father   . Diabetes Father   . Colon cancer Neg Hx   . Esophageal cancer Neg Hx   . Colon polyps Neg Hx   . Rectal cancer  Neg Hx   . Stomach cancer Neg Hx     Social History   Socioeconomic History  . Marital status: Married    Spouse name: Not on file  . Number of children: Not on file  . Years of education: Not on file  . Highest education level: Not on file  Occupational History  . Not on file  Social Needs  . Financial resource strain: Not hard at all  . Food insecurity    Worry: Never true    Inability: Never true  . Transportation needs    Medical: No    Non-medical: No  Tobacco Use  . Smoking status: Never Smoker  . Smokeless tobacco: Former Network engineer and Sexual Activity  . Alcohol use: Yes    Comment: 08/02/2017 "maybe 1 drink/year"  . Drug use: Never  . Sexual activity: Not Currently  Lifestyle  . Physical activity    Days per week: 0 days    Minutes per session: Not on file  . Stress: Not at all  Relationships  . Social Herbalist on phone: Twice a week    Gets together: Twice a week    Attends religious service: More than 4 times per year    Active member of  club or organization: Yes    Attends meetings of clubs or organizations: Never    Relationship status: Married  . Intimate partner violence    Fear of current or ex partner: No    Emotionally abused: No    Physically abused: No    Forced sexual activity: No  Other Topics Concern  . Not on file  Social History Narrative  . Not on file    Review of Systems: ALL ROS discussed and all others negative except listed in HPI.  Physical Exam: Complete physical exam not performed due to the limits inherent in a telehealth encounter.  General: Awake, alert, and oriented, and well communicative. In no acute distress.  HEENT: EOMI, non-icteric sclera, NCAT, MMM  Neck: Normal movement of head and neck  Pulm: No labored breathing, speaking in full sentences without conversational dyspnea  Derm: No apparent lesions or bruising in visible field  MS: Moves all visible extremities without noticeable abnormality   Psych: Pleasant, cooperative, normal speech, normal affect and normal insight Neuro: Alert and appropriate   Vernadette Stutsman L. Tarri Glenn, MD, MPH [facility] 09/19/2018, 11:20 AM

## 2018-09-19 NOTE — Transfer of Care (Signed)
Immediate Anesthesia Transfer of Care Note  Patient: Martin Bush  Procedure(s) Performed: COLONOSCOPY WITH PROPOFOL (N/A ) BIOPSY POLYPECTOMY SUBMUCOSAL INJECTION  Patient Location: PACU  Anesthesia Type:MAC  Level of Consciousness: sedated  Airway & Oxygen Therapy: Patient Spontanous Breathing and Patient connected to face mask oxygen  Post-op Assessment: Report given to RN and Post -op Vital signs reviewed and stable  Post vital signs: Reviewed and stable  Last Vitals:  Vitals Value Taken Time  BP    Temp    Pulse    Resp    SpO2      Last Pain:  Vitals:   09/19/18 1038  PainSc: 0-No pain         Complications: No apparent anesthesia complications

## 2018-09-19 NOTE — Op Note (Signed)
[facility] Patient Name: Martin Bush Procedure Date: 09/19/2018 MRN: [id number] Attending MD: Thornton Park MD, MD Date of Birth: [birth year] CSN: [id number] Age: 63 Admit Type: Outpatient Procedure:                Colonoscopy Indications:              Screening for colorectal malignant neoplasm                           On Brilinta                           Chronic constipation attributed to medication                            side-effects                           Normal colonoscopy 05/17/08                           No known family history of colon cancer or polyps Providers:                Thornton Park MD, MD, Cleda Daub, RN, Cherylynn Ridges, Technician Referring MD:              Medicines:                See the Anesthesia note for documentation of the                            administered medications Complications:            No immediate complications. Estimated blood loss:                            Minimal. Estimated Blood Loss:     Estimated blood loss was minimal. Procedure:                Pre-Anesthesia Assessment:                           - Prior to the procedure, a History and Physical                            was performed, and patient medications and                            allergies were reviewed. The patient's tolerance of                            previous anesthesia was also reviewed. The risks                            and benefits of the procedure and the sedation  options and risks were discussed with the patient.                            All questions were answered, and informed consent                            was obtained. Prior Anticoagulants: The patient has                            taken antiplatelet medication, last dose was 5 days                            prior to procedure. ASA Grade Assessment: III - A                            patient with severe  systemic disease. After                            reviewing the risks and benefits, the patient was                            deemed in satisfactory condition to undergo the                            procedure.                           After obtaining informed consent, the colonoscope                            was passed under direct vision. Throughout the                            procedure, the patient's blood pressure, pulse, and                            oxygen saturations were monitored continuously. The                            [id number] Olympus colonoscope was                            introduced through the anus and advanced to the the                            terminal ileum, with identification of the                            appendiceal orifice and IC valve. A second forward                            view of the right colon was performed. The  colonoscopy was performed without difficulty. The                            patient tolerated the procedure well. The quality                            of the bowel preparation was adequate to identify                            polyps. The terminal ileum, ileocecal valve,                            appendiceal orifice, and rectum were photographed. Scope In: 11:58:32 AM Scope Out: 12:26:20 PM Scope Withdrawal Time: 0 hours 24 minutes 37 seconds  Total Procedure Duration: 0 hours 27 minutes 48 seconds  Findings:      Skin tags were found on perianal exam.      A 3 mm polyp was found in the distal sigmoid colon. The polyp was       sessile. The polyp was removed with a cold snare. Resection and       retrieval were complete. Estimated blood loss was minimal.      A 25-30 mm polyp was found in the rectum. The polyp was broad-based and       umbilicated with heaped-up margins. It is worrisome for adenocarcinoma.       Multiple biopsies were taken with a cold forceps for histology. Area was        tattooed with an injection of 4 mL of [place] ink.      The exam was otherwise without abnormality on direct and retroflexion       views. Impression:               - Perianal skin tags found on perianal exam.                           - One 3 mm polyp in the distal sigmoid colon,                            removed with a cold snare. Resected and retrieved.                           - One 25-30 mm polyp in the rectum. Biopsied.                            Tattooed.                           - The examination was otherwise normal on direct                            and retroflexion views. Moderate Sedation:      Not Applicable - Patient had care per Anesthesia. Recommendation:           - Patient has a contact number available for  emergencies. The signs and symptoms of potential                            delayed complications were discussed with the                            patient. Return to normal activities tomorrow.                            Written discharge instructions were provided to the                            patient.                           - Resume previous diet today.                           - Continue present medications.                           - Resume Brilinta tomorrow.                           - Await pathology results to determine additional                            recommendations. Procedure Code(s):        --- Professional ---                           [phone], Colonoscopy, flexible; with removal of                            tumor(s), polyp(s), or other lesion(s) by snare                            technique                           45381, Colonoscopy, flexible; with directed                            submucosal injection(s), any substance                           [id number], 54, Colonoscopy, flexible; with biopsy,                            single or multiple Diagnosis Code(s):        --- Professional ---                            Z12.11, Encounter for screening for malignant                            neoplasm of colon  K63.5, Polyp of colon                           K62.1, Rectal polyp                           K64.4, Residual hemorrhoidal skin tags CPT copyright 2019 American Medical Association. All rights reserved. The codes documented in this report are preliminary and upon coder review may  be revised to meet current compliance requirements. Thornton Park MD, MD 09/19/2018 12:36:22 PM This report has been signed electronically. Number of Addenda: 0

## 2018-09-19 NOTE — Discharge Instructions (Signed)
YOU HAD AN ENDOSCOPIC PROCEDURE TODAY: Refer to the procedure report and other information in the discharge instructions given to you for any specific questions about what was found during the examination. If this information does not answer your questions, please call [place] office at [phone] to clarify.   YOU SHOULD EXPECT: Some feelings of bloating in the abdomen. Passage of more gas than usual. Walking can help get rid of the air that was put into your GI tract during the procedure and reduce the bloating. If you had a lower endoscopy (such as a colonoscopy or flexible sigmoidoscopy) you may notice spotting of blood in your stool or on the toilet paper. Some abdominal soreness may be present for a day or two, also.  DIET: Your first meal following the procedure should be a light meal and then it is ok to progress to your normal diet. A half-sandwich or bowl of soup is an example of a good first meal. Heavy or fried foods are harder to digest and may make you feel nauseous or bloated. Drink plenty of fluids but you should avoid alcoholic beverages for 24 hours. If you had a esophageal dilation, please see attached instructions for diet.    ACTIVITY: Your care partner should take you home directly after the procedure. You should plan to take it easy, moving slowly for the rest of the day. You can resume normal activity the day after the procedure however YOU SHOULD NOT DRIVE, use power tools, machinery or perform tasks that involve climbing or major physical exertion for 24 hours (because of the sedation medicines used during the test).   SYMPTOMS TO REPORT IMMEDIATELY: A gastroenterologist can be reached at any hour. Please call [phone]  for any of the following symptoms:  Following lower endoscopy (colonoscopy, flexible sigmoidoscopy) Excessive amounts of blood in the stool  Significant tenderness, worsening of abdominal pains  Swelling of the abdomen that is new, acute  Fever of 100 or  higher  Following upper endoscopy (EGD, EUS, ERCP, esophageal dilation) Vomiting of blood or coffee ground material  New, significant abdominal pain  New, significant chest pain or pain under the shoulder blades  Painful or persistently difficult swallowing  New shortness of breath  Black, tarry-looking or red, bloody stools  FOLLOW UP:  If any biopsies were taken you will be contacted by phone or by letter within the next 1-3 weeks. Call [phone]  if you have not heard about the biopsies in 3 weeks.  Please also call with any specific questions about appointments or follow up tests.YOU HAD AN ENDOSCOPIC PROCEDURE TODAY: Refer to the procedure report and other information in the discharge instructions given to you for any specific questions about what was found during the examination. If this information does not answer your questions, please call [place] office at [phone] to clarify.   YOU SHOULD EXPECT: Some feelings of bloating in the abdomen. Passage of more gas than usual. Walking can help get rid of the air that was put into your GI tract during the procedure and reduce the bloating. If you had a lower endoscopy (such as a colonoscopy or flexible sigmoidoscopy) you may notice spotting of blood in your stool or on the toilet paper. Some abdominal soreness may be present for a day or two, also.  DIET: Your first meal following the procedure should be a light meal and then it is ok to progress to your normal diet. A half-sandwich or bowl of soup is an example of a   good first meal. Heavy or fried foods are harder to digest and may make you feel nauseous or bloated. Drink plenty of fluids but you should avoid alcoholic beverages for 24 hours. If you had a esophageal dilation, please see attached instructions for diet.    ACTIVITY: Your care partner should take you home directly after the procedure. You should plan to take it easy, moving slowly for the rest of the day. You can resume  normal activity the day after the procedure however YOU SHOULD NOT DRIVE, use power tools, machinery or perform tasks that involve climbing or major physical exertion for 24 hours (because of the sedation medicines used during the test).   SYMPTOMS TO REPORT IMMEDIATELY: A gastroenterologist can be reached at any hour. Please call [phone]  for any of the following symptoms:  Following lower endoscopy (colonoscopy, flexible sigmoidoscopy) Excessive amounts of blood in the stool  Significant tenderness, worsening of abdominal pains  Swelling of the abdomen that is new, acute  Fever of 100 or higher  Following upper endoscopy (EGD, EUS, ERCP, esophageal dilation) Vomiting of blood or coffee ground material  New, significant abdominal pain  New, significant chest pain or pain under the shoulder blades  Painful or persistently difficult swallowing  New shortness of breath  Black, tarry-looking or red, bloody stools  FOLLOW UP:  If any biopsies were taken you will be contacted by phone or by letter within the next 1-3 weeks. Call [phone]  if you have not heard about the biopsies in 3 weeks.  Please also call with any specific questions about appointments or follow up tests. 

## 2018-09-21 ENCOUNTER — Encounter (HOSPITAL_COMMUNITY): Payer: Self-pay | Admitting: Gastroenterology

## 2018-09-22 ENCOUNTER — Encounter: Payer: Self-pay | Admitting: *Deleted

## 2018-09-22 ENCOUNTER — Other Ambulatory Visit: Payer: Self-pay | Admitting: *Deleted

## 2018-09-22 DIAGNOSIS — Z1211 Encounter for screening for malignant neoplasm of colon: Secondary | ICD-10-CM

## 2018-09-26 DIAGNOSIS — Z4502 Encounter for adjustment and management of automatic implantable cardiac defibrillator: Secondary | ICD-10-CM | POA: Diagnosis not present

## 2018-09-26 DIAGNOSIS — I5042 Chronic combined systolic (congestive) and diastolic (congestive) heart failure: Secondary | ICD-10-CM | POA: Diagnosis not present

## 2018-09-26 DIAGNOSIS — Z95810 Presence of automatic (implantable) cardiac defibrillator: Secondary | ICD-10-CM | POA: Diagnosis not present

## 2018-09-27 ENCOUNTER — Encounter: Payer: Self-pay | Admitting: Cardiology

## 2018-09-27 ENCOUNTER — Other Ambulatory Visit (INDEPENDENT_AMBULATORY_CARE_PROVIDER_SITE_OTHER): Payer: No Typology Code available for payment source

## 2018-09-27 ENCOUNTER — Telehealth: Payer: Self-pay | Admitting: Gastroenterology

## 2018-09-27 DIAGNOSIS — I5043 Acute on chronic combined systolic (congestive) and diastolic (congestive) heart failure: Secondary | ICD-10-CM | POA: Insufficient documentation

## 2018-09-27 DIAGNOSIS — I5042 Chronic combined systolic (congestive) and diastolic (congestive) heart failure: Secondary | ICD-10-CM

## 2018-09-27 DIAGNOSIS — Z4502 Encounter for adjustment and management of automatic implantable cardiac defibrillator: Secondary | ICD-10-CM | POA: Insufficient documentation

## 2018-09-27 DIAGNOSIS — Z1211 Encounter for screening for malignant neoplasm of colon: Secondary | ICD-10-CM

## 2018-09-27 DIAGNOSIS — Z006 Encounter for examination for normal comparison and control in clinical research program: Secondary | ICD-10-CM | POA: Insufficient documentation

## 2018-09-27 DIAGNOSIS — I255 Ischemic cardiomyopathy: Secondary | ICD-10-CM

## 2018-09-27 HISTORY — DX: Ischemic cardiomyopathy: I25.5

## 2018-09-27 HISTORY — DX: Encounter for adjustment and management of automatic implantable cardiac defibrillator: Z45.02

## 2018-09-27 HISTORY — DX: Chronic combined systolic (congestive) and diastolic (congestive) heart failure: I50.42

## 2018-09-27 LAB — BASIC METABOLIC PANEL
BUN: 46 mg/dL — ABNORMAL HIGH (ref 6–23)
CO2: 26 mEq/L (ref 19–32)
Calcium: 9.5 mg/dL (ref 8.4–10.5)
Chloride: 94 mEq/L — ABNORMAL LOW (ref 96–112)
Creatinine, Ser: 2.06 mg/dL — ABNORMAL HIGH (ref 0.40–1.50)
GFR: 32.8 mL/min — ABNORMAL LOW (ref >60.00)
Glucose, Bld: 508 mg/dL (ref 70–99)
Potassium: 4.8 mEq/L (ref 3.5–5.1)
Sodium: 129 mEq/L — ABNORMAL LOW (ref 135–145)

## 2018-09-27 NOTE — Telephone Encounter (Signed)
Called with critical glucose level.   Spoke with the patient and his wife. Mental status appears baseline and he doesn't sound symptomatic with regard to hyperglycemia and hyponatremia.  He has been eating his wife's birthday cake over the last couple of dates. Notes his glucose normally runs 100s-200s. He has sliding scale insulin at home. He denies symptoms of dehyration, but, did have an unexplained fall last week when he injured his ribs. Continues to have some ongoing rib pain. He is otherwise asymptomatic.  Will contact PMD, Dr. Caryl Bis, to make him aware. Patient and wife agreed to go to the ER with the onset of any new symptoms tonight.

## 2018-09-28 ENCOUNTER — Other Ambulatory Visit: Payer: Self-pay | Admitting: *Deleted

## 2018-09-28 ENCOUNTER — Emergency Department (HOSPITAL_COMMUNITY)
Admission: EM | Admit: 2018-09-28 | Discharge: 2018-09-29 | Disposition: A | Discharge status: Home / Self Care | Payer: No Typology Code available for payment source | Attending: Emergency Medicine | Admitting: Emergency Medicine

## 2018-09-28 ENCOUNTER — Other Ambulatory Visit: Payer: Self-pay

## 2018-09-28 ENCOUNTER — Telehealth: Payer: Self-pay | Admitting: Gastroenterology

## 2018-09-28 ENCOUNTER — Encounter (HOSPITAL_COMMUNITY): Payer: Self-pay | Admitting: Emergency Medicine

## 2018-09-28 ENCOUNTER — Other Ambulatory Visit (INDEPENDENT_AMBULATORY_CARE_PROVIDER_SITE_OTHER): Payer: No Typology Code available for payment source

## 2018-09-28 ENCOUNTER — Other Ambulatory Visit: Payer: Self-pay | Admitting: Family Medicine

## 2018-09-28 ENCOUNTER — Emergency Department (HOSPITAL_COMMUNITY): Payer: No Typology Code available for payment source

## 2018-09-28 ENCOUNTER — Telehealth: Payer: Self-pay | Admitting: Family Medicine

## 2018-09-28 DIAGNOSIS — Z7984 Long term (current) use of oral hypoglycemic drugs: Secondary | ICD-10-CM | POA: Insufficient documentation

## 2018-09-28 DIAGNOSIS — Y929 Unspecified place or not applicable: Secondary | ICD-10-CM | POA: Diagnosis not present

## 2018-09-28 DIAGNOSIS — Z7982 Long term (current) use of aspirin: Secondary | ICD-10-CM | POA: Insufficient documentation

## 2018-09-28 DIAGNOSIS — Y999 Unspecified external cause status: Secondary | ICD-10-CM | POA: Diagnosis not present

## 2018-09-28 DIAGNOSIS — D649 Anemia, unspecified: Secondary | ICD-10-CM | POA: Diagnosis not present

## 2018-09-28 DIAGNOSIS — N183 Chronic kidney disease, stage 3 unspecified: Secondary | ICD-10-CM

## 2018-09-28 DIAGNOSIS — Z955 Presence of coronary angioplasty implant and graft: Secondary | ICD-10-CM | POA: Diagnosis not present

## 2018-09-28 DIAGNOSIS — M25532 Pain in left wrist: Secondary | ICD-10-CM | POA: Diagnosis not present

## 2018-09-28 DIAGNOSIS — E871 Hypo-osmolality and hyponatremia: Secondary | ICD-10-CM | POA: Diagnosis not present

## 2018-09-28 DIAGNOSIS — I959 Hypotension, unspecified: Secondary | ICD-10-CM | POA: Diagnosis not present

## 2018-09-28 DIAGNOSIS — R0789 Other chest pain: Secondary | ICD-10-CM | POA: Diagnosis present

## 2018-09-28 DIAGNOSIS — W109XXA Fall (on) (from) unspecified stairs and steps, initial encounter: Secondary | ICD-10-CM | POA: Diagnosis not present

## 2018-09-28 DIAGNOSIS — N179 Acute kidney failure, unspecified: Secondary | ICD-10-CM

## 2018-09-28 DIAGNOSIS — Z95810 Presence of automatic (implantable) cardiac defibrillator: Secondary | ICD-10-CM | POA: Insufficient documentation

## 2018-09-28 DIAGNOSIS — I130 Hypertensive heart and chronic kidney disease with heart failure and stage 1 through stage 4 chronic kidney disease, or unspecified chronic kidney disease: Secondary | ICD-10-CM | POA: Diagnosis not present

## 2018-09-28 DIAGNOSIS — E1122 Type 2 diabetes mellitus with diabetic chronic kidney disease: Secondary | ICD-10-CM

## 2018-09-28 DIAGNOSIS — I5042 Chronic combined systolic (congestive) and diastolic (congestive) heart failure: Secondary | ICD-10-CM | POA: Diagnosis not present

## 2018-09-28 DIAGNOSIS — I252 Old myocardial infarction: Secondary | ICD-10-CM | POA: Insufficient documentation

## 2018-09-28 DIAGNOSIS — N289 Disorder of kidney and ureter, unspecified: Secondary | ICD-10-CM

## 2018-09-28 DIAGNOSIS — Z79899 Other long term (current) drug therapy: Secondary | ICD-10-CM | POA: Insufficient documentation

## 2018-09-28 DIAGNOSIS — Y939 Activity, unspecified: Secondary | ICD-10-CM | POA: Insufficient documentation

## 2018-09-28 DIAGNOSIS — I2510 Atherosclerotic heart disease of native coronary artery without angina pectoris: Secondary | ICD-10-CM | POA: Insufficient documentation

## 2018-09-28 DIAGNOSIS — IMO0002 Reserved for concepts with insufficient information to code with codable children: Secondary | ICD-10-CM

## 2018-09-28 DIAGNOSIS — W19XXXA Unspecified fall, initial encounter: Secondary | ICD-10-CM

## 2018-09-28 DIAGNOSIS — E1165 Type 2 diabetes mellitus with hyperglycemia: Secondary | ICD-10-CM

## 2018-09-28 DIAGNOSIS — Y92009 Unspecified place in unspecified non-institutional (private) residence as the place of occurrence of the external cause: Secondary | ICD-10-CM

## 2018-09-28 DIAGNOSIS — Z1211 Encounter for screening for malignant neoplasm of colon: Secondary | ICD-10-CM

## 2018-09-28 DIAGNOSIS — M25562 Pain in left knee: Secondary | ICD-10-CM | POA: Insufficient documentation

## 2018-09-28 LAB — CBC
HCT: 42.3 % (ref 39.0–52.0)
HCT: 41.7 % (ref 39.0–52.0)
Hemoglobin: 14.3 g/dL (ref 13.0–17.0)
Hemoglobin: 14.1 g/dL (ref 13.0–17.0)
MCH: 30.9 pg (ref 26.0–34.0)
MCHC: 33.8 g/dL (ref 30.0–36.0)
MCHC: 33.8 g/dL (ref 30.0–36.0)
MCV: 91.4 fl (ref 78.0–100.0)
MCV: 91.2 fL (ref 80.0–100.0)
Platelets: 213 10*3/uL (ref 150.0–400.0)
Platelets: 207 10*3/uL (ref 150–400)
RBC: 4.63 Mil/uL (ref 4.22–5.81)
RBC: 4.57 MIL/uL (ref 4.22–5.81)
RDW: 12.7 % (ref 11.5–15.5)
RDW: 12 % (ref 11.5–15.5)
WBC: 9.2 10*3/uL (ref 4.0–10.5)
WBC: 8.8 10*3/uL (ref 4.0–10.5)
nRBC: 0 % (ref 0.0–0.2)

## 2018-09-28 LAB — COMPREHENSIVE METABOLIC PANEL
ALT: 12 U/L (ref 0–53)
ALT: 15 U/L (ref 0–44)
AST: 12 U/L (ref 0–37)
AST: 16 U/L (ref 15–41)
Albumin: 4.1 g/dL (ref 3.5–5.2)
Albumin: 3.7 g/dL (ref 3.5–5.0)
Alkaline Phosphatase: 93 U/L (ref 39–117)
Alkaline Phosphatase: 86 U/L (ref 38–126)
Anion gap: 12 (ref 5–15)
BUN: 47 mg/dL — ABNORMAL HIGH (ref 6–23)
BUN: 44 mg/dL — ABNORMAL HIGH (ref 8–23)
CO2: 25 mEq/L (ref 19–32)
CO2: 23 mmol/L (ref 22–32)
Calcium: 9.8 mg/dL (ref 8.4–10.5)
Calcium: 9.6 mg/dL (ref 8.9–10.3)
Chloride: 98 mEq/L (ref 96–112)
Chloride: 98 mmol/L (ref 98–111)
Creatinine, Ser: 1.82 mg/dL — ABNORMAL HIGH (ref 0.40–1.50)
Creatinine, Ser: 2.04 mg/dL — ABNORMAL HIGH (ref 0.61–1.24)
GFR calc Af Amer: 39 mL/min — ABNORMAL LOW (ref >60)
GFR calc non Af Amer: 34 mL/min — ABNORMAL LOW (ref >60)
GFR: 37.84 mL/min — ABNORMAL LOW (ref >60.00)
Glucose, Bld: 169 mg/dL — ABNORMAL HIGH (ref 70–99)
Glucose, Bld: 160 mg/dL — ABNORMAL HIGH (ref 70–99)
Potassium: 4.2 mEq/L (ref 3.5–5.1)
Potassium: 4.3 mmol/L (ref 3.5–5.1)
Sodium: 134 mEq/L — ABNORMAL LOW (ref 135–145)
Sodium: 133 mmol/L — ABNORMAL LOW (ref 135–145)
Total Bilirubin: 0.6 mg/dL (ref 0.2–1.2)
Total Bilirubin: 0.6 mg/dL (ref 0.3–1.2)
Total Protein: 6.8 g/dL (ref 6.0–8.3)
Total Protein: 6.7 g/dL (ref 6.5–8.1)

## 2018-09-28 LAB — BASIC METABOLIC PANEL
BUN: 47 mg/dL — ABNORMAL HIGH (ref 6–23)
CO2: 25 mEq/L (ref 19–32)
Calcium: 9.8 mg/dL (ref 8.4–10.5)
Chloride: 98 mEq/L (ref 96–112)
Creatinine, Ser: 1.82 mg/dL — ABNORMAL HIGH (ref 0.40–1.50)
GFR: 37.84 mL/min — ABNORMAL LOW (ref >60.00)
Glucose, Bld: 169 mg/dL — ABNORMAL HIGH (ref 70–99)
Potassium: 4.2 mEq/L (ref 3.5–5.1)
Sodium: 134 mEq/L — ABNORMAL LOW (ref 135–145)

## 2018-09-28 LAB — TROPONIN I (HIGH SENSITIVITY)
Troponin I (High Sensitivity): 28 ng/L — ABNORMAL HIGH (ref <18)
Troponin I (High Sensitivity): 25 ng/L — ABNORMAL HIGH (ref <18)

## 2018-09-28 LAB — LIPID PANEL
Cholesterol: 156 mg/dL (ref 0–200)
HDL: 37.6 mg/dL — ABNORMAL LOW (ref >39.00)
NonHDL: 118.76
Total CHOL/HDL Ratio: 4
Triglycerides: 233 mg/dL — ABNORMAL HIGH (ref 0.0–149.0)
VLDL: 46.6 mg/dL — ABNORMAL HIGH (ref 0.0–40.0)

## 2018-09-28 LAB — HEMOGLOBIN A1C: Hgb A1c MFr Bld: 12.5 % — ABNORMAL HIGH (ref 4.6–6.5)

## 2018-09-28 LAB — LDL CHOLESTEROL, DIRECT: Direct LDL: 88 mg/dL

## 2018-09-28 MED ORDER — SODIUM CHLORIDE 0.9% FLUSH: 3.0000 mL | Freq: Once | INTRAVENOUS | Status: DC

## 2018-09-28 NOTE — Telephone Encounter (Signed)
Pt's wife [name] called to inform that pt has been very weak today and she is not sure if he needs to go to the ER. Pls call her.

## 2018-09-28 NOTE — Telephone Encounter (Signed)
Please advise 

## 2018-09-28 NOTE — Telephone Encounter (Signed)
Patient came in to office for lab appointment , patient wife advised front desk of patient having a fall on 09/27/18 . Patient did not relay this to nurse earlier when called with labs and appointment scheduled . Front desk staed scheduled appointment for to morrow with NP but called nurse for triage , went to get patient from lab dor triage he had left the office. Called patient home and his wife took the phone and explained he has been very weak today and that his right hand was swollen and bruised and that she feels he may of  hit his ribs in the fall . Advised with patient HX the amount of pain and weakness she explained he should be evaluated sooner. Advised she should take patient to UC or ER she stated she would take him when she takes the dog to the [facility] if SOB , chest pain or increases weakness should go now.. Patient wife advised they would go now.[name] Kitchen

## 2018-09-28 NOTE — ED Notes (Signed)
Call wife when back to room  [place] [phone]

## 2018-09-28 NOTE — Telephone Encounter (Signed)
Noted. Agree with urgent care evaluation.  

## 2018-09-28 NOTE — Telephone Encounter (Signed)
Thank you :)

## 2018-09-28 NOTE — Telephone Encounter (Signed)
FYI Spoke to the patient's wife who is taking her husband to [facility] ED for evaluation. She states the patient has been "wobbly" all day, quiet but alert and SOB. The patient's wife also called his PCP who also recommended he to go the ED for evaluation.

## 2018-09-28 NOTE — ED Triage Notes (Signed)
Pt with multiple complaints-  1. [place] down a flight of stairs on Tuesday morning-- left hand swollen, left ribs hurt, left knee swollen  2. Called from dr's office today -- told his sodium was "dangerously low"  3. Has defib pacemaker-- denies any shocks--\ 4. Dizzy -- started yesterday worse today 5. Takes torsemide

## 2018-09-28 NOTE — Telephone Encounter (Signed)
Please change the orders for the CT so that it is performed without contrast given his renal insufficiency. Thank you.

## 2018-09-28 NOTE — Telephone Encounter (Signed)
New order for CT abd/pelvis WO contrast placed in Epic. Called radiology, confirmed appt.

## 2018-09-29 ENCOUNTER — Ambulatory Visit (HOSPITAL_COMMUNITY): Admission: RE | Admit: 2018-09-29 | Payer: No Typology Code available for payment source | Source: Ambulatory Visit

## 2018-09-29 ENCOUNTER — Ambulatory Visit (INDEPENDENT_AMBULATORY_CARE_PROVIDER_SITE_OTHER): Payer: Medicare Other | Admitting: Family Medicine

## 2018-09-29 ENCOUNTER — Other Ambulatory Visit: Payer: Self-pay

## 2018-09-29 ENCOUNTER — Encounter: Payer: Self-pay | Admitting: Family Medicine

## 2018-09-29 ENCOUNTER — Emergency Department (HOSPITAL_COMMUNITY): Payer: No Typology Code available for payment source

## 2018-09-29 ENCOUNTER — Telehealth: Payer: Self-pay | Admitting: Gastroenterology

## 2018-09-29 ENCOUNTER — Ambulatory Visit (HOSPITAL_COMMUNITY)
Admission: RE | Admit: 2018-09-29 | Discharge: 2018-09-29 | Disposition: A | Discharge status: Home / Self Care | Payer: No Typology Code available for payment source | Source: Ambulatory Visit | Attending: Gastroenterology | Admitting: Gastroenterology

## 2018-09-29 ENCOUNTER — Ambulatory Visit (HOSPITAL_COMMUNITY): Payer: No Typology Code available for payment source

## 2018-09-29 ENCOUNTER — Ambulatory Visit (INDEPENDENT_AMBULATORY_CARE_PROVIDER_SITE_OTHER): Payer: No Typology Code available for payment source

## 2018-09-29 VITALS — BP 102/60 | HR 86 | Temp 96.1°F | Resp 18 | Ht 67.0 in | Wt 216.2 lb

## 2018-09-29 DIAGNOSIS — W19XXXS Unspecified fall, sequela: Secondary | ICD-10-CM | POA: Diagnosis not present

## 2018-09-29 DIAGNOSIS — E1165 Type 2 diabetes mellitus with hyperglycemia: Secondary | ICD-10-CM | POA: Diagnosis not present

## 2018-09-29 DIAGNOSIS — Z23 Encounter for immunization: Secondary | ICD-10-CM | POA: Diagnosis not present

## 2018-09-29 DIAGNOSIS — E871 Hypo-osmolality and hyponatremia: Secondary | ICD-10-CM | POA: Diagnosis not present

## 2018-09-29 DIAGNOSIS — E1122 Type 2 diabetes mellitus with diabetic chronic kidney disease: Secondary | ICD-10-CM

## 2018-09-29 DIAGNOSIS — IMO0002 Reserved for concepts with insufficient information to code with codable children: Secondary | ICD-10-CM

## 2018-09-29 DIAGNOSIS — M79642 Pain in left hand: Secondary | ICD-10-CM | POA: Diagnosis not present

## 2018-09-29 DIAGNOSIS — N183 Chronic kidney disease, stage 3 unspecified: Secondary | ICD-10-CM

## 2018-09-29 DIAGNOSIS — Z1211 Encounter for screening for malignant neoplasm of colon: Secondary | ICD-10-CM

## 2018-09-29 LAB — CBC
HCT: 41.1 % (ref 39.0–52.0)
Hemoglobin: 14 g/dL (ref 13.0–17.0)
MCHC: 34 g/dL (ref 30.0–36.0)
MCV: 92.5 fl (ref 78.0–100.0)
Platelets: 203 10*3/uL (ref 150.0–400.0)
RBC: 4.45 Mil/uL (ref 4.22–5.81)
RDW: 13 % (ref 11.5–15.5)
WBC: 8.7 10*3/uL (ref 4.0–10.5)

## 2018-09-29 LAB — BASIC METABOLIC PANEL
BUN: 50 mg/dL — ABNORMAL HIGH (ref 6–23)
CO2: 23 mEq/L (ref 19–32)
Calcium: 9 mg/dL (ref 8.4–10.5)
Chloride: 98 mEq/L (ref 96–112)
Creatinine, Ser: 1.89 mg/dL — ABNORMAL HIGH (ref 0.40–1.50)
GFR: 36.23 mL/min — ABNORMAL LOW (ref >60.00)
Glucose, Bld: 313 mg/dL — ABNORMAL HIGH (ref 70–99)
Potassium: 4.1 mEq/L (ref 3.5–5.1)
Sodium: 133 mEq/L — ABNORMAL LOW (ref 135–145)

## 2018-09-29 NOTE — ED Provider Notes (Signed)
[facility] EMERGENCY DEPARTMENT Provider Note   CSN: [id number] Arrival date & time: 09/28/18  1603    History   Chief Complaint Chief Complaint  Patient presents with  . Fall  . Dizziness  . hyponatremia  . sent from dr's office    HPI Martin Bush is a 63 y.o. male.   The history is provided by the patient.  He has history of hypertension, diabetes, hyperlipidemia, combined systolic and diastolic heart failure and comes in because of being shaky and unsteady on his feet.  He had fallen down steps 2 days ago, and states that he has felt wobbly ever since then.  He hit the left side of his chest and his left hand when he fell but states that it is not very painful right now.  It bothers him mostly when he is up and trying to move around.  He had a blood test done in preparation for getting a CT scan and was told his sodium was dangerously low.  Blood test was repeated today.  He denies headache and denies nausea or vomiting.  Past Medical History:  Diagnosis Date  . Arthritis    "thumbs"  (08/02/2017)  . CHF (congestive heart failure) ([place])   . Chronic combined systolic and diastolic heart failure ([place]) 09/27/2018  . Colon polyps   . Coronary artery disease   . Depression   . High cholesterol   . Hypertension   . Ischemic cardiomyopathy 09/27/2018  . MI (myocardial infarction) ([place]) 2003  . OSA on CPAP   . Oxygen deficiency   . Pneumonia 10/2014  . Proteinuria   . Sleep apnea   . Type II diabetes mellitus (HCC)    insulin dependent    Patient Active Problem List   Diagnosis Date Noted  . Ischemic cardiomyopathy 09/27/2018  . Encounter for assessment of implantable cardioverter-defibrillator (ICD) 09/27/2018  . Chronic combined systolic and diastolic heart failure ([place]) 09/27/2018  . Enrolled in clinical trial of drug 06/13/2018  . Weight loss 09/19/2017  . Ventricular tachycardia ([place])   . ICD (implantable cardioverter-defibrillator) discharge  08/02/2017  . Obesity 06/17/2017  . CHF (congestive heart failure) ([place]) 07/23/2015  . Hyperlipidemia 07/11/2015  . Essential hypertension 07/11/2015  . Coronary artery disease involving autologous artery coronary bypass graft without angina pectoris 07/11/2015  . Depression 07/11/2015  . CKD (chronic kidney disease), stage III ([place]) 07/11/2015  . OSA on CPAP 07/11/2015  . Lightheadedness 07/11/2015  . Carotid artery stenosis   . S/P eye surgery 05/15/2015  . Injury of left toe 04/27/2015  . Chronic low back pain 02/13/2015  . Uncontrolled type 2 diabetes with stage 3 chronic kidney disease GFR 30-59 ([place]) 02/13/2015  . Dizziness 02/05/2015  . ICD; Biventricular  Medtronic ICD Amplia MRI QWuad CRT-D  in situ 10/29/14 10/29/2014  . NSTEMI (non-ST elevated myocardial infarction) ([place]) 07/16/2014    Past Surgical History:  Procedure Laterality Date  . BIOPSY  09/19/2018   Procedure: BIOPSY;  Surgeon: Thornton Park, MD;  Location: [facility];  Service: Gastroenterology;;  . CARDIAC CATHETERIZATION N/A 07/16/2014   Procedure: Left Heart Cath and Coronary Angiography;  Surgeon: Adrian Prows, MD;  Location: [facility];  Service: Cardiovascular;  Laterality: N/A;  . CARDIAC CATHETERIZATION  07/16/2014   Procedure: Coronary Balloon Angioplasty;  Surgeon: Adrian Prows, MD;  Location: [place] [facility];  Service: Cardiovascular;;  . CARDIAC CATHETERIZATION  2003  . CARDIAC DEFIBRILLATOR PLACEMENT  2016  . CATARACT EXTRACTION  W/ INTRAOCULAR LENS IMPLANT Left 03/2014  . COLONOSCOPY    . COLONOSCOPY WITH PROPOFOL N/A 09/19/2018   Procedure: COLONOSCOPY WITH PROPOFOL;  Surgeon: Thornton Park, MD;  Location: [facility];  Service: Gastroenterology;  Laterality: N/A;  . CORONARY ANGIOPLASTY WITH STENT PLACEMENT     "I've got a total of 8 stents in there; mostly doine at [facility]" (08/02/2017)  . CORONARY ARTERY BYPASS GRAFT  ~ 2003   "CABG X2"; [facility]  . CORONARY ATHERECTOMY  N/A 08/16/2017   Procedure: CORONARY ATHERECTOMY;  Surgeon: Nigel Mormon, MD;  Location: [place] [facility] LAB;  Service: Cardiovascular;  Laterality: N/A;  . CORONARY BALLOON ANGIOPLASTY N/A 08/04/2017   Procedure: CORONARY BALLOON ANGIOPLASTY;  Surgeon: Adrian Prows, MD;  Location: [facility];  Service: Cardiovascular;  Laterality: N/A;  . CORONARY STENT INTERVENTION N/A 08/16/2017   Procedure: CORONARY STENT INTERVENTION;  Surgeon: Nigel Mormon, MD;  Location: [place] [facility];  Service: Cardiovascular;  Laterality: N/A;  . ELBOW SURGERY Left ?2001   "pinched nerve"  . LEFT HEART CATH AND CORS/GRAFTS ANGIOGRAPHY N/A 08/04/2017   Procedure: LEFT HEART CATH AND CORS/GRAFTS ANGIOGRAPHY;  Surgeon: Adrian Prows, MD;  Location: [place] [facility];  Service: Cardiovascular;  Laterality: N/A;  . LEFT HEART CATH AND CORS/GRAFTS ANGIOGRAPHY N/A 08/20/2017   Procedure: LEFT HEART CATH AND CORS/GRAFTS ANGIOGRAPHY;  Surgeon: Nigel Mormon, MD;  Location: [place] [facility];  Service: Cardiovascular;  Laterality: N/A;  . POLYPECTOMY  09/19/2018   Procedure: POLYPECTOMY;  Surgeon: Thornton Park, MD;  Location: [facility];  Service: Gastroenterology;;  . Lia Foyer INJECTION  09/19/2018   Procedure: SUBMUCOSAL INJECTION;  Surgeon: Thornton Park, MD;  Location: [facility];  Service: Gastroenterology;;        Home Medications    Prior to Admission medications   Medication Sig Start Date End Date Taking? Authorizing Provider  amiodarone (PACERONE) 200 MG tablet Take 200 mg by mouth daily.  11/19/14   [provider]  aspirin EC 81 MG tablet Take 81 mg by mouth daily.    [provider]  carvedilol (COREG) 12.5 MG tablet Take 6.25 mg by mouth 2 (two) times daily with a meal.    [provider]  Cholecalciferol (VITAMIN D) 2000 units tablet Take 2,000 Units by mouth 2 (two) times daily.    [provider]  empagliflozin (JARDIANCE) 25 MG  TABS tablet Take 25 mg by mouth daily.    [provider]  insulin aspart protamine - aspart (NOVOLOG MIX 70/30 FLEXPEN) (70-30) 100 UNIT/ML FlexPen Inject 60 Units into the skin See admin instructions. Inject 60 units in the morning, may inject another 60 unit dose in the evening as needed for blood sugar over 200    [provider]  isosorbide mononitrate (IMDUR) 30 MG 24 hr tablet Take 30 mg by mouth daily.    [provider]  liraglutide (VICTOZA) 18 MG/3ML SOPN Inject 1.8 mg into the skin daily.    [provider]  metFORMIN (GLUCOPHAGE-XR) 500 MG 24 hr tablet Take 1,000 mg by mouth 2 (two) times daily.    [provider]  metolazone (ZAROXOLYN) 2.5 MG tablet Take 2.5 mg by mouth daily as needed (for >2 lb weight gain).    [provider]  Omega-3 Fatty Acids (FISH OIL) 1000 MG CAPS Take 1,000 mg by mouth 2 (two) times daily.     [provider]  potassium chloride SA (K-DUR,KLOR-CON) 20 MEQ tablet  Take 1 tablet (20 mEq total) by mouth 2 (two) times daily. 01/07/18   Erskine Squibb, MD  PRESCRIPTION MEDICATION Inhale into the lungs at bedtime. CPAP    [provider]  rosuvastatin (CRESTOR) 40 MG tablet Take 40 mg by mouth at bedtime.    [provider]  sacubitril-valsartan (ENTRESTO) 97-103 MG Take 1 tablet by mouth 2 (two) times daily.     [provider]  sertraline (ZOLOFT) 100 MG tablet Take 50 mg by mouth at bedtime.     [provider]  ticagrelor (BRILINTA) 90 MG TABS tablet Take 1 tablet (90 mg total) by mouth 2 (two) times daily. 08/05/17   Adrian Prows, MD  torsemide (DEMADEX) 20 MG tablet TAKE 1 TABLET BY MOUTH TWICE A DAY Patient taking differently: Take 20 mg by mouth 2 (two) times daily.  06/01/18   Miquel Dunn, NP    Family History Family History  Problem Relation Age of Onset  . Uterine cancer Mother   . Lung cancer Mother   . Hyperlipidemia Father   . Heart disease  Father   . Hypertension Father   . Diabetes Father   . Colon cancer Neg Hx   . Esophageal cancer Neg Hx   . Colon polyps Neg Hx   . Rectal cancer Neg Hx   . Stomach cancer Neg Hx     Social History Social History   Tobacco Use  . Smoking status: Never Smoker  . Smokeless tobacco: Former Network engineer Use Topics  . Alcohol use: Yes    Comment: 08/02/2017 "maybe 1 drink/year"  . Drug use: Never     Allergies   Diltiazem   Review of Systems Review of Systems  All other systems reviewed and are negative.    Physical Exam Updated Vital Signs BP 100/70 (BP Location: Left Arm)   Pulse 78   Temp 98.5 F (36.9 C) (Oral)   Resp 16   Ht 5\' 7"  (1.702 m)   Wt 97.5 kg   SpO2 98%   BMI 33.67 kg/m   Physical Exam Vitals signs and nursing note reviewed.    63 year old male, resting comfortably and in no acute distress. Vital signs are normal. Oxygen saturation is 98%, which is normal. Head is normocephalic and atraumatic. PERRLA, EOMI. Oropharynx is clear. Neck is nontender and supple without adenopathy or JVD. Back is nontender and there is no CVA tenderness. Lungs are clear without rales, wheezes, or rhonchi. Chest is mildly tender in the left lateral rib cage.  There is no crepitus. Heart has regular rate and rhythm without murmur. Abdomen is soft, flat, nontender without masses or hepatosplenomegaly and peristalsis is normoactive. Extremities have 1+ edema, full range of motion is present. Skin is warm and dry without rash. Neurologic: Mental status is normal, cranial nerves are intact, there are no motor or sensory deficits.  No ataxia noted on finger-to-nose exam.  ED Treatments / Results  Labs (all labs ordered are listed, but only abnormal results are displayed) Labs Reviewed  COMPREHENSIVE METABOLIC PANEL - Abnormal; Notable for the following components:      Result Value   Sodium 133 (*)    Glucose, Bld 160 (*)    BUN 44 (*)    Creatinine, Ser 2.04 (*)     GFR calc non Af Amer 34 (*)    GFR calc Af Amer 39 (*)    All other components within normal limits  TROPONIN I (HIGH SENSITIVITY) -  Abnormal; Notable for the following components:   Troponin I (High Sensitivity) 28 (*)    All other components within normal limits  TROPONIN I (HIGH SENSITIVITY) - Abnormal; Notable for the following components:   Troponin I (High Sensitivity) 25 (*)    All other components within normal limits  CBC    EKG EKG Interpretation  Date/Time:  Thursday September 28 2018 16:10:24 EDT Ventricular Rate:  70 PR Interval:  172 QRS Duration: 132 QT Interval:  476 QTC Calculation: 514 R Axis:   -127 Text Interpretation:  Atrial-sensed ventricular-paced rhythm When compared with ECG of 01/07/2018 Atrial-sensed ventricular-paced rhythm has replaced Sinus rhythm QT has shortened Confirmed by Delora Fuel ([id number]) on 09/28/2018 11:36:36 PM   Radiology Dg Chest 2 View  Result Date: 09/28/2018 CLINICAL DATA:  63 year old who fell down stairs 2 days ago and complains of MEDIAL LEFT wrist pain, LATERAL LEFT knee pain and RIGHT-sided chest pain. Initial encounter. EXAM: CHEST - 2 VIEW COMPARISON:  10/26/2017 and earlier. FINDINGS: Sternotomy for CABG. Cardiac silhouette moderately enlarged, unchanged. LEFT circumflex coronary artery stent again noted. LEFT subclavian biventricular pacemaker unchanged. Hilar and mediastinal contours otherwise unremarkable. Lungs clear. Bronchovascular markings normal. Pulmonary vascularity normal. No visible pleural effusions. No pneumothorax. Degenerative changes involving the thoracic and visualized lumbar spine. No interval change. IMPRESSION: Stable cardiomegaly. No acute cardiopulmonary disease. Electronically Signed   By: Evangeline Dakin M.D.   On: 09/28/2018 17:03   Dg Wrist Complete Left  Result Date: 09/28/2018 CLINICAL DATA:  Left wrist pain for fall EXAM: LEFT WRIST - COMPLETE 3+ VIEW COMPARISON:  None. FINDINGS: There is no  evidence of fracture or dislocation. Moderate-severe degenerative changes at the first carpometacarpal joint. Mild triscaphe joint degenerative change. Vascular calcifications are present. No focal soft tissue swelling. IMPRESSION: No acute osseous abnormality, left wrist. Electronically Signed   By: Davina Poke M.D.   On: 09/28/2018 17:04   Dg Knee 2 Views Left  Result Date: 09/28/2018 CLINICAL DATA:  Left knee pain after fall EXAM: LEFT KNEE - 1-2 VIEW COMPARISON:  None. FINDINGS: No evidence of fracture, dislocation, or joint effusion. No evidence of arthropathy or other focal bone abnormality. Scattered vascular calcifications. Soft tissues are unremarkable. IMPRESSION: No acute osseous abnormality, left knee. Electronically Signed   By: Davina Poke M.D.   On: 09/28/2018 17:07   Ct Head Wo Contrast  Result Date: 09/29/2018 CLINICAL DATA:  Fall down stairs. Maxface trauma blunt EXAM: CT HEAD WITHOUT CONTRAST TECHNIQUE: Contiguous axial images were obtained from the base of the skull through the vertex without intravenous contrast. COMPARISON:  01/07/2018 FINDINGS: Brain: No intracranial hemorrhage, mass effect, or midline shift. No hydrocephalus. The basilar cisterns are patent. No evidence of territorial infarct or acute ischemia. No extra-axial or intracranial fluid collection. Vascular: Atherosclerosis of skullbase vasculature without hyperdense vessel or abnormal calcification. Skull: No fracture or focal lesion. Sinuses/Orbits: No acute finding. Tiny mucous retention cyst right maxillary sinus. Left cataract resection. Other: None. IMPRESSION: No acute intracranial abnormality. No skull fracture. Electronically Signed   By: Keith Rake M.D.   On: 09/29/2018 02:44    Procedures Procedures  Medications Ordered in ED Medications  sodium chloride flush (NS) 0.9 % injection 3 mL (has no administration in time range)     Initial Impression / Assessment and Plan / ED Course  I have  reviewed the triage vital signs and the nursing notes.  Pertinent labs & imaging results that were available during my care of the patient  were reviewed by me and considered in my medical decision making (see chart for details).  Fall at home and unsteadiness since then.  He is noted to have a rather low blood pressure in triage, but review of his record shows that his blood pressure has been in a similar range in the past.  Will check orthostatic vital signs.  I am also concerned about possibility of a stroke either leading to his fall down steps or in the aftermath of that.  He is also noted to be on dual antiplatelet therapy.  Will send for CT of the head.  Prior to my seeing him, chest x-ray was noted to be unremarkable, x-rays of left wrist and left knee were unremarkable.  Labs show renal insufficiency which is stable, and mild hyponatremia.  The lowest sodium I see recently was 129 on September 2.  This is certainly not enough to account for any of his symptoms.  CT of the head shows no acute process.  No evidence of intracranial injury.  Orthostatic vital signs were obtained showing no significant change in pulse or blood pressure.  Patient was ambulated in the ED and was able to ambulate without any unsteadiness.  He was felt to be safe for discharge.  Final Clinical Impressions(s) / ED Diagnoses   Final diagnoses:  Fall in home, initial encounter  Hyponatremia  Renal insufficiency    ED Discharge Orders    None       Delora Fuel, MD [phone]

## 2018-09-29 NOTE — Progress Notes (Signed)
Subjective:    Patient ID: Martin Bush, male    DOB: 1955-10-29, 63 y.o.   MRN: [id number]  HPI   Patient presents to clinic for follow-up after fall at his home yesterday.  When he fell he landed on left hand/wrist causing pain.  He continues to have pain in left hand.  They did do an x-ray of left wrist and also left knee with an emergency department, both x-rays of left knee and wrist are negative for fracture.  There was concern over reason as to why patient fell.  Patient does not remember tripping, denies hitting his head.  ER physician was concerned about possibility of a stroke or other cause of the fall, CT brain was performed and was negative for any acute abnormality.  Lab work done in ER did show hyponatremia and decreased renal function.  Decreased renal function is not new for patient, he has diagnosis of CKD related to type 2 diabetes.  Patient overall states he is feeling back to his normal self other than pain in left hand.  Concerned he possibly fractured left hand and this was not picked up on the wrist x-ray done in the emergency department.  Xray done 09/28/2018 at hospital EXAM: LEFT WRIST - COMPLETE 3+ VIEW  COMPARISON:  None.  FINDINGS: There is no evidence of fracture or dislocation. Moderate-severe degenerative changes at the first carpometacarpal joint. Mild triscaphe joint degenerative change. Vascular calcifications are present. No focal soft tissue swelling.  IMPRESSION: No acute osseous abnormality, left wrist.  CXR negative   Knee Xray negative   CT brain done 09/28/2018: COMPARISON:  01/07/2018  FINDINGS: Brain: No intracranial hemorrhage, mass effect, or midline shift. No hydrocephalus. The basilar cisterns are patent. No evidence of territorial infarct or acute ischemia. No extra-axial or intracranial fluid collection.  Vascular: Atherosclerosis of skullbase vasculature without hyperdense vessel or abnormal calcification.  Skull:  No fracture or focal lesion.  Sinuses/Orbits: No acute finding. Tiny mucous retention cyst right maxillary sinus. Left cataract resection.  Other: None.  IMPRESSION: No acute intracranial abnormality. No skull fracture.   Lab work in done in [place] reviewed by me   Patient Active Problem List   Diagnosis Date Noted   Ischemic cardiomyopathy 09/27/2018   Encounter for assessment of implantable cardioverter-defibrillator (ICD) 09/27/2018   Chronic combined systolic and diastolic heart failure ([place]) 09/27/2018   Enrolled in clinical trial of drug 06/13/2018   Weight loss 09/19/2017   Ventricular tachycardia ([place])    ICD (implantable cardioverter-defibrillator) discharge 08/02/2017   Obesity 06/17/2017   CHF (congestive heart failure) ([place]) 07/23/2015   Hyperlipidemia 07/11/2015   Essential hypertension 07/11/2015   Coronary artery disease involving autologous artery coronary bypass graft without angina pectoris 07/11/2015   Depression 07/11/2015   CKD (chronic kidney disease), stage III ([place]) 07/11/2015   OSA on CPAP 07/11/2015   Lightheadedness 07/11/2015   Carotid artery stenosis    S/P eye surgery 05/15/2015   Injury of left toe 04/27/2015   Chronic low back pain 02/13/2015   Uncontrolled type 2 diabetes with stage 3 chronic kidney disease GFR 30-59 ([place]) 02/13/2015   Dizziness 02/05/2015   ICD; Biventricular  Medtronic ICD Amplia MRI QWuad CRT-D  in situ 10/29/14 10/29/2014   NSTEMI (non-ST elevated myocardial infarction) ([place]) 07/16/2014   Social History   Tobacco Use   Smoking status: Never Smoker   Smokeless tobacco: Former Systems developer  Substance Use Topics   Alcohol use: Yes    Comment: 08/02/2017 "maybe 1  drink/year"   Review of Systems  Constitutional: Negative for chills, fatigue and fever.  HENT: Negative for congestion, ear pain, sinus pain and sore throat.   Eyes: Negative.   Respiratory: Negative for cough, shortness of breath and  wheezing.   Cardiovascular: Negative for chest pain, palpitations and leg swelling.  Gastrointestinal: Negative for abdominal pain, diarrhea, nausea and vomiting.  Genitourinary: Negative for dysuria, frequency and urgency.  Musculoskeletal: +left hand pain Skin: Negative for color change, pallor and rash.  Neurological: Negative for syncope, light-headedness and headaches.  Psychiatric/Behavioral: The patient is not nervous/anxious.       Objective:   Physical Exam Vitals signs and nursing note reviewed.  Constitutional:      General: He is not in acute distress.    Appearance: He is obese. He is not ill-appearing, toxic-appearing or diaphoretic.  HENT:     Head: Normocephalic and atraumatic.  Eyes:     General: No scleral icterus.    Extraocular Movements: Extraocular movements intact.     Conjunctiva/sclera: Conjunctivae normal.     Pupils: Pupils are equal, round, and reactive to light.  Neck:     Musculoskeletal: Normal range of motion. No neck rigidity.  Cardiovascular:     Rate and Rhythm: Normal rate and regular rhythm.  Pulmonary:     Effort: Pulmonary effort is normal. No respiratory distress.     Breath sounds: Normal breath sounds.  Musculoskeletal:       Hands:     Right lower leg: No edema.     Left lower leg: No edema.     Comments: Skin abrasions on left hand represented by red marks on diagram.  Patient has generalized tenderness across back of hand with palpation and also tenderness in left pinky.  Is able to wiggle all fingers is able to make a fist but not able to fully bend the pinky all the way down.  Range of motion of left wrist intact.  Skin:    General: Skin is warm and dry.     Comments: A few skin abrasions on left pinky finger from fall, all are dried up/scabbed. No surrounding skin redness.   Neurological:     General: No focal deficit present.     Mental Status: He is alert and oriented to person, place, and time.     Cranial Nerves: No cranial  nerve deficit.     Comments: Speech clear, no pronator drift. Smile symmetrical. Can raise eyebrows and puff out cheeks.   Psychiatric:        Mood and Affect: Mood normal.        Behavior: Behavior normal.        Thought Content: Thought content normal.        Judgment: Judgment normal.     Today's Vitals   09/29/18 1009  BP: 102/60  Pulse: 86  Resp: 18  Temp: (!) 96.1 F (35.6 C)  TempSrc: Temporal  SpO2: 96%  Weight: 216 lb 3.2 oz (98.1 kg)  Height: 5\' 7"  (1.702 m)   Body mass index is 33.86 kg/m.    Assessment & Plan:    1. Need for immunization against influenza  - Flu Vaccine QUAD 36+ mos IM  2. Hand pain, left  X-rays of knee and wrist negative in ER.  We will get x-ray of hand due to hand pain and pinky pain.  Advised he can use Tylenol as needed for pain control and also suggested he try topical rub like a  Biofreeze to help soreness of hand.  Also suggested he can try a warm pack on hand to help relieve pain.  - DG Hand Complete Left; Future  3. Hyponatremia We will follow-up with basic metabolic panel to double check sodium level.  Advised to drink G2 or Powerade Zero along with some water to help keep up electrolytes along with eating balanced diet.   - Basic metabolic panel  4. Fall, sequela Unclear reason for fall, we will do labs to be sure things are remaining stable.  Imaging of head done in ER is unremarkable for any acute intracranial abnormality.  - CBC - Basic metabolic panel  5. Uncontrolled type 2 diabetes with stage 3 chronic kidney disease GFR 30-59 (HCC)  We will follow-up with basic metabolic panel to monitor electrolytes and kidney functions to make sure things are remaining stable.  - Basic metabolic panel  Patient will be contacted with results of x-ray and lab work.  He is aware he can call office anytime with questions or concerns.  He will keep all regular follow-ups with PCP as planned.

## 2018-09-29 NOTE — ED Notes (Signed)
Patient able to ambulate without any complaints.

## 2018-09-29 NOTE — ED Notes (Signed)
[place] pts wife

## 2018-09-29 NOTE — Telephone Encounter (Signed)
Patient told to still come and have his CT completed.

## 2018-09-29 NOTE — Discharge Instructions (Addendum)
Return if you are having any problems. 

## 2018-09-29 NOTE — ED Notes (Signed)
Patient awaiting wife's arrival to pick him up.

## 2018-10-05 ENCOUNTER — Telehealth: Payer: Self-pay | Admitting: Family Medicine

## 2018-10-05 DIAGNOSIS — M899 Disorder of bone, unspecified: Secondary | ICD-10-CM

## 2018-10-05 NOTE — Telephone Encounter (Signed)
-----   Message from Thornton Park, MD sent at 10/02/2018  3:42 PM EDT ----- Thank you for your input. If you would order it, I would be grateful. I leave for vacation this evening and won't be back until next week. Many, many thanks. ----- Message ----- From: [name], MD Sent: 10/02/2018  12:56 PM EDT To: Thornton Park, MD  I think it would be a good idea to proceed with the bone scan if his insurance will approve it. He does not have a known history of cancer, though I think the bone scan will either provide reassurance or point [place] in a direction of something to look for. Please let me know if you would like for me to order this.   Randall Hiss ----- Message ----- From: Thornton Park, MD Sent: 09/30/2018   1:42 PM EDT To: [name], MD  I reviewed the results of the CT scan, obtained to evaluate the large, ulcerated rectal polyp seen on recent colonoscopy,  with the patient by phone. I wasn't sure what to make of the nonspecific changes in the right iliac bone and was hoping to get your input.  Many thanks. Thornton Park

## 2018-10-05 NOTE — Telephone Encounter (Signed)
Please let the patient know that I heard from his GI physician.  She updated me on his CT scan and they noted that there were some nonspecific findings in some of his bones.  They recommended a bone scan.  I have placed an order for this.  Please attempt to get this approved by his insurance.

## 2018-10-06 ENCOUNTER — Telehealth: Payer: Self-pay

## 2018-10-06 NOTE — Telephone Encounter (Signed)
Please let the patient know that I heard from his GI physician.  She updated me on his CT scan and they noted that there were some nonspecific findings in some of his bones.  They recommended a bone scan.  I have placed an order for this. Dr. Caryl Bis

## 2018-10-08 DIAGNOSIS — I5043 Acute on chronic combined systolic (congestive) and diastolic (congestive) heart failure: Secondary | ICD-10-CM | POA: Diagnosis not present

## 2018-10-10 ENCOUNTER — Telehealth: Payer: Self-pay | Admitting: *Deleted

## 2018-10-10 NOTE — Telephone Encounter (Signed)
It is in one of his pelvic bones. These type of lesions can be benign, though warrant further evaluation to determine if there is some other underlying cause.

## 2018-10-10 NOTE — Telephone Encounter (Signed)
Called pt regarding his nm whole bone scan that is scheduled for 9/18. His wife and he asking what it is for. Informed him it was due to a bone lesion that was seen on a previous image but was questioning further as far as where the lesion was seen. Please give them a call back-he request to call his wife's phone-Nancy [phone]

## 2018-10-10 NOTE — Telephone Encounter (Signed)
Copied from [place] [id number]. Topic: Referral - Status >> Oct 10, 2018  2:13 PM Scherrie Gerlach wrote: Reason for CRM: Pre cert center states they need to know which coverage he will be using for the bone scan. Find out if he is using [facility] or [place].  Pt cannot use both. He has both Myra will continue to check until 9/17

## 2018-10-11 NOTE — Telephone Encounter (Signed)
I called and spoke with the patient's wife and gave her the information from the provider about the lesions that showed up on the patient's bone scan.   She understood and stated he has a scheduled appointment on Friday for whole bone scan.  Laurence Folz,cma

## 2018-10-11 NOTE — Telephone Encounter (Signed)
Yes, I have verified his insurance-[facility]-no auth required for [place] Bone Scan.   Gae Bon, Can you call pt with information below from Dr. Caryl Bis. He and his wife had a lot of questions related to the reason for the bone scan.  Thx, melissa

## 2018-10-12 ENCOUNTER — Emergency Department
Admission: EM | Admit: 2018-10-12 | Discharge: 2018-10-13 | Disposition: A | Discharge status: Home / Self Care | Payer: No Typology Code available for payment source | Attending: Student | Admitting: Student

## 2018-10-12 ENCOUNTER — Encounter: Payer: Self-pay | Admitting: Emergency Medicine

## 2018-10-12 ENCOUNTER — Other Ambulatory Visit: Payer: Self-pay

## 2018-10-12 DIAGNOSIS — I2510 Atherosclerotic heart disease of native coronary artery without angina pectoris: Secondary | ICD-10-CM | POA: Insufficient documentation

## 2018-10-12 DIAGNOSIS — Z79899 Other long term (current) drug therapy: Secondary | ICD-10-CM | POA: Diagnosis not present

## 2018-10-12 DIAGNOSIS — I5042 Chronic combined systolic (congestive) and diastolic (congestive) heart failure: Secondary | ICD-10-CM | POA: Insufficient documentation

## 2018-10-12 DIAGNOSIS — Z7982 Long term (current) use of aspirin: Secondary | ICD-10-CM | POA: Insufficient documentation

## 2018-10-12 DIAGNOSIS — Z794 Long term (current) use of insulin: Secondary | ICD-10-CM | POA: Diagnosis not present

## 2018-10-12 DIAGNOSIS — R197 Diarrhea, unspecified: Secondary | ICD-10-CM | POA: Insufficient documentation

## 2018-10-12 DIAGNOSIS — R55 Syncope and collapse: Secondary | ICD-10-CM | POA: Diagnosis not present

## 2018-10-12 DIAGNOSIS — Z951 Presence of aortocoronary bypass graft: Secondary | ICD-10-CM | POA: Insufficient documentation

## 2018-10-12 DIAGNOSIS — N183 Chronic kidney disease, stage 3 (moderate): Secondary | ICD-10-CM | POA: Insufficient documentation

## 2018-10-12 DIAGNOSIS — E860 Dehydration: Secondary | ICD-10-CM | POA: Insufficient documentation

## 2018-10-12 DIAGNOSIS — E1122 Type 2 diabetes mellitus with diabetic chronic kidney disease: Secondary | ICD-10-CM | POA: Insufficient documentation

## 2018-10-12 DIAGNOSIS — I130 Hypertensive heart and chronic kidney disease with heart failure and stage 1 through stage 4 chronic kidney disease, or unspecified chronic kidney disease: Secondary | ICD-10-CM | POA: Diagnosis not present

## 2018-10-12 LAB — CBC WITH DIFFERENTIAL/PLATELET
Abs Immature Granulocytes: 0.1 10*3/uL — ABNORMAL HIGH (ref 0.00–0.07)
Basophils Absolute: 0.1 10*3/uL (ref 0.0–0.1)
Basophils Relative: 1 %
Eosinophils Absolute: 0.3 10*3/uL (ref 0.0–0.5)
Eosinophils Relative: 4 %
HCT: 38.1 % — ABNORMAL LOW (ref 39.0–52.0)
Hemoglobin: 12.9 g/dL — ABNORMAL LOW (ref 13.0–17.0)
Immature Granulocytes: 1 %
Lymphocytes Relative: 11 %
Lymphs Abs: 1 10*3/uL (ref 0.7–4.0)
MCH: 30.8 pg (ref 26.0–34.0)
MCHC: 33.9 g/dL (ref 30.0–36.0)
MCV: 90.9 fL (ref 80.0–100.0)
Monocytes Absolute: 0.7 10*3/uL (ref 0.1–1.0)
Monocytes Relative: 9 %
Neutro Abs: 6.3 10*3/uL (ref 1.7–7.7)
Neutrophils Relative %: 74 %
Platelets: 161 10*3/uL (ref 150–400)
RBC: 4.19 MIL/uL — ABNORMAL LOW (ref 4.22–5.81)
RDW: 12.1 % (ref 11.5–15.5)
WBC: 8.5 10*3/uL (ref 4.0–10.5)
nRBC: 0 % (ref 0.0–0.2)

## 2018-10-12 LAB — COMPREHENSIVE METABOLIC PANEL
ALT: 13 U/L (ref 0–44)
AST: 16 U/L (ref 15–41)
Albumin: 3.9 g/dL (ref 3.5–5.0)
Alkaline Phosphatase: 83 U/L (ref 38–126)
Anion gap: 10 (ref 5–15)
BUN: 34 mg/dL — ABNORMAL HIGH (ref 8–23)
CO2: 22 mmol/L (ref 22–32)
Calcium: 8.7 mg/dL — ABNORMAL LOW (ref 8.9–10.3)
Chloride: 104 mmol/L (ref 98–111)
Creatinine, Ser: 1.59 mg/dL — ABNORMAL HIGH (ref 0.61–1.24)
GFR calc Af Amer: 53 mL/min — ABNORMAL LOW (ref >60)
GFR calc non Af Amer: 46 mL/min — ABNORMAL LOW (ref >60)
Glucose, Bld: 226 mg/dL — ABNORMAL HIGH (ref 70–99)
Potassium: 4.4 mmol/L (ref 3.5–5.1)
Sodium: 136 mmol/L (ref 135–145)
Total Bilirubin: 0.7 mg/dL (ref 0.3–1.2)
Total Protein: 6.8 g/dL (ref 6.5–8.1)

## 2018-10-12 LAB — TROPONIN I (HIGH SENSITIVITY)
Troponin I (High Sensitivity): 22 ng/L — ABNORMAL HIGH (ref <18)
Troponin I (High Sensitivity): 20 ng/L — ABNORMAL HIGH (ref <18)

## 2018-10-12 MED ORDER — SODIUM CHLORIDE 0.9 % IV BOLUS
500.0000 mL | Freq: Once | INTRAVENOUS | Status: AC
  Administered 2018-10-12: 500 mL via INTRAVENOUS

## 2018-10-12 NOTE — ED Provider Notes (Signed)
[facility] Emergency Department Provider Note  ____________________________________________   First MD Initiated Contact with Patient 10/12/18 2042     (approximate)  I have reviewed the triage vital signs and the nursing notes.  History  Chief Complaint Near Syncope and Diarrhea    HPI Martin Bush is a 64 y.o. male with a history of CAD s/p CABG and stenting, heart failure, status post pacemaker/ICD who presents to the emergency department for a near syncopal episode and diarrhea.  The patient and the wife state that they were driving in the car today when he began to feel "woozy".  He became sweaty and seemed to be less responsive.  He came to after a few minutes.  No shaking or seizure-like activity.  The patient and wife state that this happens to him frequently when he becomes dehydrated.  They say he is very sensitive to dehydration and that he had several episodes of loose diarrhea today which they suspect contributed to his symptoms.  He denies any associated chest pain, shortness of breath, nausea, vomiting, palpitations.  No history of DVT or PE.  No fevers, cough, sick contacts.  No vomiting.  No urinary symptoms including no hematuria or dysuria.  Reportedly had soft blood pressure with EMS, normal glucose.  Received IV fluids and on arrival to the emergency department he states he is feeling markedly improved.         Past Medical Hx Past Medical History:  Diagnosis Date  . Arthritis    "thumbs"  (08/02/2017)  . CHF (congestive heart failure) ([place])   . Chronic combined systolic and diastolic heart failure ([place]) 09/27/2018  . Colon polyps   . Coronary artery disease   . Depression   . High cholesterol   . Hypertension   . Ischemic cardiomyopathy 09/27/2018  . MI (myocardial infarction) ([place]) 2003  . OSA on CPAP   . Oxygen deficiency   . Pneumonia 10/2014  . Proteinuria   . Sleep apnea   . Type II diabetes mellitus (H[facility])    insulin  dependent    Problem List Patient Active Problem List   Diagnosis Date Noted  . Ischemic cardiomyopathy 09/27/2018  . Encounter for assessment of implantable cardioverter-defibrillator (ICD) 09/27/2018  . Chronic combined systolic and diastolic heart failure ([place]) 09/27/2018  . Enrolled in clinical trial of drug 06/13/2018  . Weight loss 09/19/2017  . Ventricular tachycardia ([place])   . ICD (implantable cardioverter-defibrillator) discharge 08/02/2017  . Obesity 06/17/2017  . CHF (congestive heart failure) ([place]) 07/23/2015  . Hyperlipidemia 07/11/2015  . Essential hypertension 07/11/2015  . Coronary artery disease involving autologous artery coronary bypass graft without angina pectoris 07/11/2015  . Depression 07/11/2015  . CKD (chronic kidney disease), stage III ([place]) 07/11/2015  . OSA on CPAP 07/11/2015  . Lightheadedness 07/11/2015  . Carotid artery stenosis   . S/P eye surgery 05/15/2015  . Injury of left toe 04/27/2015  . Chronic low back pain 02/13/2015  . Uncontrolled type 2 diabetes with stage 3 chronic kidney disease GFR 30-59 ([place]) 02/13/2015  . Dizziness 02/05/2015  . ICD; Biventricular  Medtronic ICD Amplia MRI QWuad CRT-D  in situ 10/29/14 10/29/2014  . NSTEMI (non-ST elevated myocardial infarction) ([place]) 07/16/2014    Past Surgical Hx Past Surgical History:  Procedure Laterality Date  . BIOPSY  09/19/2018   Procedure: BIOPSY;  Surgeon: Thornton Park, MD;  Location: [facility];  Service: Gastroenterology;;  . CARDIAC CATHETERIZATION N/A 07/16/2014   Procedure: Left Heart Cath  and Coronary Angiography;  Surgeon: Adrian Prows, MD;  Location: [place] [facility];  Service: Cardiovascular;  Laterality: N/A;  . CARDIAC CATHETERIZATION  07/16/2014   Procedure: Coronary Balloon Angioplasty;  Surgeon: Adrian Prows, MD;  Location: [place] [facility];  Service: Cardiovascular;;  . CARDIAC CATHETERIZATION  2003  . CARDIAC DEFIBRILLATOR PLACEMENT  2016  . CATARACT EXTRACTION W/  INTRAOCULAR LENS IMPLANT Left 03/2014  . COLONOSCOPY    . COLONOSCOPY WITH PROPOFOL N/A 09/19/2018   Procedure: COLONOSCOPY WITH PROPOFOL;  Surgeon: Thornton Park, MD;  [facility]: [facility];  Service: Gastroenterology;  Laterality: N/A;  . CORONARY ANGIOPLASTY WITH STENT PLACEMENT     "I've got a total of 8 stents in there; mostly [facility]ine at [facility]" (08/02/2017)  . CORONARY ARTERY BYPASS GRAFT  ~ 2003   "CABG X2"; [facility]  . CORONARY ATHERECTOMY N/A 08/16/2017   Procedure: CORONARY ATHERECTOMY;  Surgeon: Nigel Mormon, MD;  Location: [place] [facility];  Service: Cardiovascular;  Laterality: N/A;  . CORONARY BALLOON ANGIOPLASTY N/A 08/04/2017   Procedure: CORONARY BALLOON ANGIOPLASTY;  Surgeon: Adrian Prows, MD;  Location: [place] [facility];  Service: Cardiovascular;  Laterality: N/A;  . CORONARY STENT INTERVENTION N/A 08/16/2017   Procedure: CORONARY STENT INTERVENTION;  Surgeon: Nigel Mormon, MD;  Location: [place] [facility];  Service: Cardiovascular;  Laterality: N/A;  . ELBOW SURGERY Left ?2001   "pinched nerve"  . LEFT HEART CATH AND CORS/GRAFTS ANGIOGRAPHY N/A 08/04/2017   Procedure: LEFT HEART CATH AND CORS/GRAFTS ANGIOGRAPHY;  Surgeon: Adrian Prows, MD;  Location: [place] [facility];  Service: Cardiovascular;  Laterality: N/A;  . LEFT HEART CATH AND CORS/GRAFTS ANGIOGRAPHY N/A 08/20/2017   Procedure: LEFT HEART CATH AND CORS/GRAFTS ANGIOGRAPHY;  Surgeon: Nigel Mormon, MD;  Location: [place] [facility];  Service: Cardiovascular;  Laterality: N/A;  . POLYPECTOMY  09/19/2018   Procedure: POLYPECTOMY;  Surgeon: Thornton Park, MD;  [facility];  Service: Gastroenterology;;  . Lia [place]oyer INJECTION  09/19/2018   Procedure: SUBMUCOSAL INJECTION;  Surgeon: Thornton Park, MD;  [facility];  Service: Gastroenterology;;    Medications Prior to Admission medications   Medication Sig Start Date End Date Taking? Authorizing Provider   amiodarone (PACERONE) 200 MG tablet Take 200 mg by mouth daily.  11/19/14   [provider]  aspirin EC 81 MG tablet Take 81 mg by mouth daily.    [provider]  carvedilol (COREG) 12.5 MG tablet Take 6.25 mg by mouth 2 (two) times daily with a meal.    [provider]  Cholecalciferol (VITAMIN D) 2000 units tablet Take 2,000 Units by mouth 2 (two) times daily.    [provider]  empagliflozin (JARDIANCE) 25 MG TABS tablet Take 25 mg by mouth daily.    [provider]  insulin aspart protamine - aspart (NOVOLOG MIX 70/30 FLEXPEN) (70-30) 100 UNIT/ML FlexPen Inject 60 Units into the skin See admin instructions. Inject 60 units in the morning, may inject another 60 unit dose in the evening as needed for blood sugar over 200    [provider]  isosorbide mononitrate (IMDUR) 30 MG 24 hr tablet Take 30 mg by mouth daily.    [provider]  liraglutide (VICTOZA) 18 MG/3ML SOPN Inject 1.8 mg into the skin daily.    [provider]  metFORMIN (GLUCOPHAGE-XR) 500 MG 24 hr tablet Take 1,000 mg by mouth 2 (two) times daily.    [provider]  metolazone (ZAROXOLYN)  2.5 MG tablet Take 2.5 mg by mouth daily as needed (for >2 lb weight gain).    [provider]  Omega-3 Fatty Acids (FISH OIL) 1000 MG CAPS Take 1,000 mg by mouth 2 (two) times daily.     [provider]  potassium chloride SA (K-DUR,KLOR-CON) 20 MEQ tablet Take 1 tablet (20 mEq total) by mouth 2 (two) times daily. 01/07/18   Erskine Squibb, MD  PRESCRIPTION MEDICATION Inhale into the lungs at bedtime. CPAP    [provider]  rosuvastatin (CRESTOR) 40 MG tablet Take 40 mg by mouth at bedtime.    [provider]  sacubitril-valsartan (ENTRESTO) 97-103 MG Take 1 tablet by mouth 2 (two) times daily.     [provider]  sertraline (ZOLOFT) 100 MG tablet Take 50 mg by mouth at bedtime.     [provider]   ticagrelor (BRILINTA) 90 MG TABS tablet Take 1 tablet (90 mg total) by mouth 2 (two) times daily. 08/05/17   Adrian Prows, MD  torsemide (DEMADEX) 20 MG tablet TAKE 1 TABLET BY MOUTH TWICE A DAY Patient taking differently: Take 20 mg by mouth 2 (two) times daily.  06/01/18   Miquel Dunn, NP    Allergies Diltiazem  Family Hx Family History  Problem Relation Age of Onset  . Uterine cancer Mother   . Lung cancer Mother   . Hyperlipidemia Father   . Heart disease Father   . Hypertension Father   . Diabetes Father   . Colon cancer Neg Hx   . Esophageal cancer Neg Hx   . Colon polyps Neg Hx   . Rectal cancer Neg Hx   . Stomach cancer Neg Hx     Social Hx Social History   Tobacco Use  . Smoking status: Never Smoker  . Smokeless tobacco: Former Network engineer Use Topics  . Alcohol use: Yes    Comment: 08/02/2017 "maybe 1 drink/year"  . Drug use: Never     Review of Systems  Constitutional: Negative for fever. Negative for chills. Eyes: Negative for visual changes. ENT: Negative for sore throat. Cardiovascular: Negative for chest pain. Respiratory: Negative for shortness of breath. Gastrointestinal: Negative for abdominal pain. Negative for nausea. Negative for vomiting. + diarrhea Genitourinary: Negative for dysuria. Musculoskeletal: Negative for leg swelling. Skin: Negative for rash. Neurological: Negative for for headaches. + questionable LOC   Physical Exam  Vital Signs: ED Triage Vitals  Enc Vitals Group     BP 10/12/18 2051 124/72     Pulse Rate 10/12/18 2051 69     Resp 10/12/18 2051 14     Temp 10/12/18 2051 98.5 F (36.9 C)     Temp Source 10/12/18 2051 Oral     SpO2 10/12/18 2045 100 %     Weight 10/12/18 2052 215 lb (97.5 kg)     Height 10/12/18 2052 5\' 7"  (1.702 m)     Head Circumference --      Peak Flow --      Pain Score 10/12/18 2052 0     Pain Loc --      Pain Edu? --      Excl. in [place]? --     Constitutional: Alert and oriented.   Eyes: Conjunctivae clear. Sclera anicteric. Head: Normocephalic. Atraumatic. Nose: No congestion. No rhinorrhea. Mouth/Throat: Mucous membranes are slightly.  Neck: No stridor.   Cardiovascular: Normal rate, regular rhythm. Extremities well perfused. Respiratory: Normal respiratory effort.  Lungs CTAB. Gastrointestinal: Soft and  non-tender. No distention.  Musculoskeletal: No lower extremity edema. Neurologic:  Normal speech and language. No gross focal neurologic deficits are appreciated.  Skin: Skin is warm, dry and intact. No rash noted. Psychiatric: Mood and affect are appropriate for situation.  EKG  Personally reviewed.   Rate: 76 Rhythm: paced rhythm Axis: LAD Intervals: abnormal due to paced rhythm Negative Sgarbossa criteria No STEMI    Radiology  N/A   Procedures  Procedure(s) performed (including critical care):  Procedures   Initial Impression / Assessment and Plan / ED Course  63 y.o. male who presents to the ED for diarrhea, dehydration, possible LOC as above  Ddx: dehydration/orthostatsis, arrhythmia, ACS  Plan: labs, EKG, fluids, reassess.   Cr at baseline. EKG reveals paced rhythm, no STEMI equivalent.  Initial high-sensitivity troponin  20, which appears stable and even slightly improved from his baseline.  He reports he continues to feel well in the emergency department.  Will obtain a repeat delta troponin if remains stable, will plan for discharge home with outpatient PCP/cardiology follow-up.  Patient and wife are agreeable with the plan.   Final Clinical Impression(s) / ED Diagnosis  Final diagnoses:  Dehydration  Diarrhea, unspecified type       Note:  This document was prepared using Dragon voice recognition software and may include unintentional dictation errors.   Lilia Pro., MD 10/12/18 [phone]

## 2018-10-12 NOTE — ED Triage Notes (Signed)
Patient reports via EMS from home, was visiting daughter for dinner when he "had a strange feeling" and felt like passing out. Daughter states "he had a blank stare". Patient has extensive cardiac history, hx 10 stents and CABG x2. Patient reports having 4 episodes of diarrhea today

## 2018-10-12 NOTE — Discharge Instructions (Signed)
Thank you for letting us take care of you in the emergency department today.   Please continue to take any regular, prescribed medications.   Please follow up with: - Your cardiologist - Your primary care doctor to review your ER visit and follow up on your symptoms.   Please return to the ER for any new or worsening symptoms.

## 2018-10-13 ENCOUNTER — Other Ambulatory Visit: Payer: Self-pay

## 2018-10-13 ENCOUNTER — Encounter
Admission: RE | Admit: 2018-10-13 | Discharge: 2018-10-13 | Disposition: A | Discharge status: Home / Self Care | Payer: No Typology Code available for payment source | Source: Ambulatory Visit | Attending: Family Medicine | Admitting: Family Medicine

## 2018-10-13 DIAGNOSIS — M899 Disorder of bone, unspecified: Secondary | ICD-10-CM

## 2018-10-13 MED ORDER — TECHNETIUM TC 99M MEDRONATE IV KIT
20.0000 | PACK | Freq: Once | INTRAVENOUS | Status: AC | PRN
  Administered 2018-10-13: 21.46 via INTRAVENOUS

## 2018-10-18 ENCOUNTER — Encounter: Payer: Self-pay | Admitting: Family Medicine

## 2018-10-19 ENCOUNTER — Other Ambulatory Visit: Payer: Self-pay | Admitting: Cardiology

## 2018-10-23 ENCOUNTER — Telehealth: Payer: Self-pay

## 2018-10-23 DIAGNOSIS — R948 Abnormal results of function studies of other organs and systems: Secondary | ICD-10-CM

## 2018-10-23 DIAGNOSIS — E785 Hyperlipidemia, unspecified: Secondary | ICD-10-CM

## 2018-10-23 NOTE — Telephone Encounter (Signed)
Copied from [place] [id number]. Topic: General - Other >> Oct 23, 2018 10:55 AM Carolyn Stare wrote:  Pt call to say he would try the Zetia  he would also like to no his bone density results   [place]

## 2018-10-25 ENCOUNTER — Encounter: Payer: Self-pay | Admitting: Family Medicine

## 2018-10-25 NOTE — Telephone Encounter (Signed)
I called the patient back again to give bone scan results and left a message.  [place] for [employer] to advice.

## 2018-10-26 MED ORDER — EZETIMIBE 10 MG PO TABS: 10.0000 mg | ORAL_TABLET | Freq: Every day | ORAL | 3 refills | Status: DC

## 2018-10-26 NOTE — Telephone Encounter (Signed)
Zetia sent to pharmacy. Please relay the bone scan results. He needs to have an x-ray of his shoulder blade to evaluate further. I have placed this to be completed at the hospital. He does not need an appointment for this and should go to the medical mall to have this completed. He also needs a repeat lipid panel in 8 weeks. Orders placed.  Thanks.

## 2018-10-26 NOTE — Addendum Note (Signed)
Addended by: [name] on: 10/26/2018 09:03 AM   Modules accepted: Orders

## 2018-10-27 ENCOUNTER — Encounter: Payer: Self-pay | Admitting: *Deleted

## 2018-10-27 DIAGNOSIS — I5042 Chronic combined systolic (congestive) and diastolic (congestive) heart failure: Secondary | ICD-10-CM | POA: Diagnosis not present

## 2018-10-27 DIAGNOSIS — Z95810 Presence of automatic (implantable) cardiac defibrillator: Secondary | ICD-10-CM | POA: Diagnosis not present

## 2018-10-27 DIAGNOSIS — Z4502 Encounter for adjustment and management of automatic implantable cardiac defibrillator: Secondary | ICD-10-CM | POA: Diagnosis not present

## 2018-10-30 ENCOUNTER — Other Ambulatory Visit (INDEPENDENT_AMBULATORY_CARE_PROVIDER_SITE_OTHER): Payer: No Typology Code available for payment source

## 2018-10-30 ENCOUNTER — Other Ambulatory Visit: Payer: Self-pay

## 2018-10-30 DIAGNOSIS — E785 Hyperlipidemia, unspecified: Secondary | ICD-10-CM

## 2018-10-30 LAB — LIPID PANEL
Cholesterol: 144 mg/dL (ref 0–200)
HDL: 39.6 mg/dL (ref >39.00)
NonHDL: 104.6
Total CHOL/HDL Ratio: 4
Triglycerides: 240 mg/dL — ABNORMAL HIGH (ref 0.0–149.0)
VLDL: 48 mg/dL — ABNORMAL HIGH (ref 0.0–40.0)

## 2018-10-30 LAB — LDL CHOLESTEROL, DIRECT: Direct LDL: 71 mg/dL

## 2018-10-31 ENCOUNTER — Ambulatory Visit: Payer: No Typology Code available for payment source | Admitting: Gastroenterology

## 2018-10-31 ENCOUNTER — Ambulatory Visit (INDEPENDENT_AMBULATORY_CARE_PROVIDER_SITE_OTHER): Payer: No Typology Code available for payment source

## 2018-10-31 DIAGNOSIS — R948 Abnormal results of function studies of other organs and systems: Secondary | ICD-10-CM | POA: Diagnosis not present

## 2018-10-31 DIAGNOSIS — M19011 Primary osteoarthritis, right shoulder: Secondary | ICD-10-CM | POA: Diagnosis not present

## 2018-11-01 NOTE — Telephone Encounter (Signed)
Patient aware that Zetia was sent & order placed for xray. Informed to go to hospital medical mall for this. He is scheduled for labs 8 weeks out.

## 2018-11-02 ENCOUNTER — Telehealth: Payer: Self-pay

## 2018-11-02 NOTE — Telephone Encounter (Signed)
Copied from [place] [id number]. Topic: General - Other >> Nov 02, 2018 11:35 AM Rainey Pines A wrote: Sharyn Lull from [facility] stated that the patient is in the office there now and  was told to come and get an xray done. Sharyn Lull stated that there is not an order for the patient and would like a callback as soon as possible at [phone] Attempted to reach office 3x

## 2018-11-02 NOTE — Telephone Encounter (Signed)
Noted. X-ray already completed.

## 2018-11-02 NOTE — Telephone Encounter (Signed)
Copied from [employer] [id number]. Topic: General - Other >> Nov 02, 2018 11:35 AM Rainey Pines A wrote: Sharyn Lull from [facility] stated that the patient is in the office there now and  was told to come and get an xray done. Sharyn Lull stated that there is not an order for the patient and would like a callback as soon as possible at [phone] Attempted to reach office 3x

## 2018-11-07 DIAGNOSIS — I5043 Acute on chronic combined systolic (congestive) and diastolic (congestive) heart failure: Secondary | ICD-10-CM | POA: Diagnosis not present

## 2018-11-09 ENCOUNTER — Other Ambulatory Visit: Payer: Self-pay

## 2018-11-09 ENCOUNTER — Encounter: Payer: Self-pay | Admitting: Gastroenterology

## 2018-11-09 ENCOUNTER — Ambulatory Visit (INDEPENDENT_AMBULATORY_CARE_PROVIDER_SITE_OTHER): Payer: No Typology Code available for payment source | Admitting: Gastroenterology

## 2018-11-09 ENCOUNTER — Telehealth: Payer: Self-pay

## 2018-11-09 ENCOUNTER — Other Ambulatory Visit (INDEPENDENT_AMBULATORY_CARE_PROVIDER_SITE_OTHER): Payer: No Typology Code available for payment source

## 2018-11-09 VITALS — BP 120/74 | HR 78 | Temp 98.6°F | Ht 67.0 in | Wt 218.0 lb

## 2018-11-09 DIAGNOSIS — R933 Abnormal findings on diagnostic imaging of other parts of digestive tract: Secondary | ICD-10-CM

## 2018-11-09 DIAGNOSIS — D128 Benign neoplasm of rectum: Secondary | ICD-10-CM

## 2018-11-09 DIAGNOSIS — Z86010 Personal history of colonic polyps: Secondary | ICD-10-CM

## 2018-11-09 LAB — CBC
HCT: 44.4 % (ref 39.0–52.0)
Hemoglobin: 15.2 g/dL (ref 13.0–17.0)
MCHC: 34.1 g/dL (ref 30.0–36.0)
MCV: 92.2 fl (ref 78.0–100.0)
Platelets: 199 10*3/uL (ref 150.0–400.0)
RBC: 4.82 Mil/uL (ref 4.22–5.81)
RDW: 13 % (ref 11.5–15.5)
WBC: 8.4 10*3/uL (ref 4.0–10.5)

## 2018-11-09 LAB — BASIC METABOLIC PANEL
BUN: 34 mg/dL — ABNORMAL HIGH (ref 6–23)
CO2: 25 mEq/L (ref 19–32)
Calcium: 9.9 mg/dL (ref 8.4–10.5)
Chloride: 100 mEq/L (ref 96–112)
Creatinine, Ser: 1.58 mg/dL — ABNORMAL HIGH (ref 0.40–1.50)
GFR: 44.53 mL/min — ABNORMAL LOW (ref >60.00)
Glucose, Bld: 330 mg/dL — ABNORMAL HIGH (ref 70–99)
Potassium: 4.9 mEq/L (ref 3.5–5.1)
Sodium: 134 mEq/L — ABNORMAL LOW (ref 135–145)

## 2018-11-09 LAB — PROTIME-INR
INR: 1.1 ratio — ABNORMAL HIGH (ref 0.8–1.0)
Prothrombin Time: 12.3 s (ref 9.6–13.1)

## 2018-11-09 NOTE — Telephone Encounter (Signed)
Patient is scheduled for colonoscopy and I have requested for preop evaluation to hold Brilinta and procedures to be done under MAC anesthesia.  I do not have contraindication to discontinue Brilinta permanently, please continue aspirin indefinitely.  He can be taken up for the upcoming procedure with low risk.  Do not hesitate to call me if you have any questions.

## 2018-11-09 NOTE — Telephone Encounter (Signed)
Request for surgical clearance:     Endoscopy Procedure  What type of surgery is being performed?     Colonoscopy  When is this surgery scheduled?     12/18/18  What type of clearance is required ?   Pharmacy  Are there any medications that need to be held prior to surgery and how long? Brilinta x5 days prior to procedure.  Practice name and name of physician performing surgery?      [email or web address]   What is your office phone and fax number?      Phone- [phone]  [phone]  Anesthesia type (None, local, MAC, general) ?       MAC

## 2018-11-09 NOTE — Patient Instructions (Signed)
You have been scheduled for a colonoscopy. Please follow written instructions given to you at your visit today.  Please pick up your prep supplies at the pharmacy within the next 1-3 days. If you use inhalers (even only as needed), please bring them with you on the day of your procedure.  Your provider has requested that you go to the basement level for lab work before leaving today and 2 days after procedure on 12/20/18 anytime between 7:30am -4:00pm . Press "B" on the elevator. The lab is located at the first door on the left as you exit the elevator.  We will send request to hold Brilinta for 5 days prior to procedure to Dr. Nadyne Coombes office.    If you are age 56 or younger, your body mass index should be between 19-25. Your Body mass index is 34.14 kg/m. If this is out of the aformentioned range listed, please consider follow up with your Primary Care Provider.   Thank you for choosing me and [facility].  Dr. Rush Landmark

## 2018-11-10 NOTE — Telephone Encounter (Signed)
Pt informed okay to hold Brilinta for 5 days prior to 12/18/18 procedure. Pt voiced understanding.

## 2018-11-12 ENCOUNTER — Encounter: Payer: Self-pay | Admitting: Gastroenterology

## 2018-11-12 DIAGNOSIS — Z86010 Personal history of colonic polyps: Secondary | ICD-10-CM | POA: Insufficient documentation

## 2018-11-12 DIAGNOSIS — R933 Abnormal findings on diagnostic imaging of other parts of digestive tract: Secondary | ICD-10-CM | POA: Insufficient documentation

## 2018-11-12 DIAGNOSIS — D128 Benign neoplasm of rectum: Secondary | ICD-10-CM | POA: Insufficient documentation

## 2018-11-12 NOTE — Progress Notes (Signed)
[place] VISIT   Primary Care Provider [name], MD [address] [place] [address] [phone]  Referring Provider Dr. Tarri Glenn  Patient Profile: Martin Bush is a 63 y.o. male with a pmh significant for ischemic cardiomyopathy (status post PCI on Brilinta), CHF, arthritis, hypertension, hyperlipidemia, sleep apnea, diabetes, colon polyps, rectal TVA.  The patient presents to the [facility] for an evaluation and management of problem(s) noted below:  Problem List 1. Tubulovillous adenoma of rectum   2. History of colonic polyps   3. Abnormal colonoscopy     History of Present Illness Please see initial consultation note by Dr. Tarri Glenn for full details of HPI.  Interval History The patient underwent colon cancer screening via colonoscopy in August by Dr. Tarri Glenn.  He was found to have multiple colon polyps as well as a rectal lesion/mass.  Biopsies returned showing evidence of tubulovillous adenoma without high-grade dysplasia.  Underwent subsequently a CT abdomen/pelvis (without contrast) that did not show any evidence of a significant mass or lesion that was invasive.  Patient was referred for consideration of advanced polyp resection.  Patient has no other significant GI symptoms other than his previously known chronic constipation.  He and his wife today are concerned about the risk of cancer and what the best next steps will be.  He denies any overt GI bleeding as manifested by melena or hematochezia.  He remains on chronic Brilinta therapy.  GI Review of Systems Positive as above Negative for abdominal pain, pyrosis, nausea, vomiting, change in bowel habits  Review of Systems General: Denies fevers/chills/weight loss HEENT: Denies oral lesions Cardiovascular: Denies chest pain/palpitations Pulmonary: Denies shortness of breath Gastroenterological: See HPI Genitourinary: Denies darkened urine  Hematological: Denies easy bruising/bleeding Dermatological: Denies jaundice Psychological: Mood is anxious about next steps   Medications Current Outpatient Medications  Medication Sig Dispense Refill  . amiodarone (PACERONE) 200 MG tablet Take 200 mg by mouth daily.     [name] Kitchen aspirin EC 81 MG tablet Take 81 mg by mouth daily.    . carvedilol (COREG) 12.5 MG tablet Take 6.25 mg by mouth 2 (two) times daily with a meal.    . Cholecalciferol (VITAMIN D) 2000 units tablet Take 2,000 Units by mouth 2 (two) times daily.    . empagliflozin (JARDIANCE) 25 MG TABS tablet Take 25 mg by mouth daily.    [name] Kitchen ezetimibe (ZETIA) 10 MG tablet Take 1 tablet (10 mg total) by mouth daily. 90 tablet 3  . insulin aspart protamine - aspart (NOVOLOG MIX 70/30 FLEXPEN) (70-30) 100 UNIT/ML FlexPen Inject 60 Units into the skin See admin instructions. Inject 60 units in the morning, may inject another 60 unit dose in the evening as needed for blood sugar over 200    . isosorbide mononitrate (IMDUR) 30 MG 24 hr tablet Take 30 mg by mouth daily.    [name] Kitchen liraglutide (VICTOZA) 18 MG/3ML SOPN Inject 1.8 mg into the skin daily.    . metFORMIN (GLUCOPHAGE-XR) 500 MG 24 hr tablet Take 1,000 mg by mouth 2 (two) times daily.    . metolazone (ZAROXOLYN) 2.5 MG tablet Take 2.5 mg by mouth daily as needed (for >2 lb weight gain).    . Omega-3 Fatty Acids (FISH OIL) 1000 MG CAPS Take 1,000 mg by mouth 2 (two) times daily.     . potassium chloride SA (K-DUR,KLOR-CON) 20 MEQ tablet Take 1 tablet (20 mEq total) by mouth 2 (two) times daily. 60 tablet 3  .  PRESCRIPTION MEDICATION Inhale into the lungs at bedtime. CPAP    . rosuvastatin (CRESTOR) 40 MG tablet Take 40 mg by mouth at bedtime.    . sacubitril-valsartan (ENTRESTO) 97-103 MG Take 1 tablet by mouth 2 (two) times daily.     . sertraline (ZOLOFT) 100 MG tablet Take 50 mg by mouth at bedtime.     . ticagrelor (BRILINTA) 90 MG TABS tablet Take 1 tablet (90 mg total) by mouth 2 (two)  times daily. 60 tablet 3  . torsemide (DEMADEX) 20 MG tablet Take 1 tablet (20 mg total) by mouth 2 (two) times daily. 180 tablet 3   No current facility-administered medications for this visit.     Allergies Allergies  Allergen Reactions  . Diltiazem Rash    Histories Past Medical History:  Diagnosis Date  . Arthritis    "thumbs"  (08/02/2017)  . CHF (congestive heart failure) ([place])   . Chronic combined systolic and diastolic heart failure ([place]) 09/27/2018  . Colon polyps   . Coronary artery disease   . Depression   . High cholesterol   . Hypertension   . Ischemic cardiomyopathy 09/27/2018  . MI (myocardial infarction) ([place]) 2003  . OSA on CPAP   . Oxygen deficiency   . Pneumonia 10/2014  . Proteinuria   . Sleep apnea   . Type II diabetes mellitus (HCC)    insulin dependent   Past Surgical History:  Procedure Laterality Date  . BIOPSY  09/19/2018   Procedure: BIOPSY;  Surgeon: Thornton Park, MD;  Location: [facility];  Service: Gastroenterology;;  . CARDIAC CATHETERIZATION N/A 07/16/2014   Procedure: Left Heart Cath and Coronary Angiography;  Surgeon: Adrian Prows, MD;  Location: [facility];  Service: Cardiovascular;  Laterality: N/A;  . CARDIAC CATHETERIZATION  07/16/2014   Procedure: Coronary Balloon Angioplasty;  Surgeon: Adrian Prows, MD;  Location: [facility];  Service: Cardiovascular;;  . CARDIAC CATHETERIZATION  2003  . CARDIAC DEFIBRILLATOR PLACEMENT  2016  . CATARACT EXTRACTION W/ INTRAOCULAR LENS IMPLANT Left 03/2014  . COLONOSCOPY    . COLONOSCOPY WITH PROPOFOL N/A 09/19/2018   Procedure: COLONOSCOPY WITH PROPOFOL;  Surgeon: Thornton Park, MD;  Location: [facility];  Service: Gastroenterology;  Laterality: N/A;  . CORONARY ANGIOPLASTY WITH STENT PLACEMENT     "I've got a total of 8 stents in there; mostly doine at [facility]" (08/02/2017)  . CORONARY ARTERY BYPASS GRAFT  ~ 2003   "CABG X2"; [facility]  . CORONARY ATHERECTOMY N/A  08/16/2017   Procedure: CORONARY ATHERECTOMY;  Surgeon: Nigel Mormon, MD;  Location: [facility];  Service: Cardiovascular;  Laterality: N/A;  . CORONARY BALLOON ANGIOPLASTY N/A 08/04/2017   Procedure: CORONARY BALLOON ANGIOPLASTY;  Surgeon: Adrian Prows, MD;  Location: [facility];  Service: Cardiovascular;  Laterality: N/A;  . CORONARY STENT INTERVENTION N/A 08/16/2017   Procedure: CORONARY STENT INTERVENTION;  Surgeon: Nigel Mormon, MD;  Location: [facility];  Service: Cardiovascular;  Laterality: N/A;  . ELBOW SURGERY Left ?2001   "pinched nerve"  . LEFT HEART CATH AND CORS/GRAFTS ANGIOGRAPHY N/A 08/04/2017   Procedure: LEFT HEART CATH AND CORS/GRAFTS ANGIOGRAPHY;  Surgeon: Adrian Prows, MD;  Location: [facility];  Service: Cardiovascular;  Laterality: N/A;  . LEFT HEART CATH AND CORS/GRAFTS ANGIOGRAPHY N/A 08/20/2017   Procedure: LEFT HEART CATH AND CORS/GRAFTS ANGIOGRAPHY;  Surgeon: Nigel Mormon, MD;  Location: [facility];  Service: Cardiovascular;  Laterality: N/A;  .  POLYPECTOMY  09/19/2018   Procedure: POLYPECTOMY;  Surgeon: Thornton Park, MD;  Location: [facility];  Service: Gastroenterology;;  . Lia Foyer INJECTION  09/19/2018   Procedure: SUBMUCOSAL INJECTION;  Surgeon: Thornton Park, MD;  Location: [facility];  Service: Gastroenterology;;   Social History   Socioeconomic History  . Marital status: Married    Spouse name: Not on file  . Number of children: Not on file  . Years of education: Not on file  . Highest education level: Not on file  Occupational History  . Not on file  Social Needs  . Financial resource strain: Not hard at all  . Food insecurity    Worry: Never true    Inability: Never true  . Transportation needs    Medical: No    Non-medical: No  Tobacco Use  . Smoking status: Never Smoker  . Smokeless tobacco: Former Network engineer and Sexual Activity  . Alcohol use: Yes    Comment:  08/02/2017 "maybe 1 drink/year"  . Drug use: Never  . Sexual activity: Not Currently  Lifestyle  . Physical activity    Days per week: 0 days    Minutes per session: Not on file  . Stress: Not at all  Relationships  . Social Herbalist on phone: Twice a week    Gets together: Twice a week    Attends religious service: More than 4 times per year    Active member of club or organization: Yes    Attends meetings of clubs or organizations: Never    Relationship status: Married  . Intimate partner violence    Fear of current or ex partner: No    Emotionally abused: No    Physically abused: No    Forced sexual activity: No  Other Topics Concern  . Not on file  Social History Narrative  . Not on file   Family History  Problem Relation Age of Onset  . Uterine cancer Mother   . Lung cancer Mother   . Hyperlipidemia Father   . Heart disease Father   . Hypertension Father   . Diabetes Father   . Colon cancer Neg Hx   . Esophageal cancer Neg Hx   . Colon polyps Neg Hx   . Rectal cancer Neg Hx   . Stomach cancer Neg Hx   . Inflammatory bowel disease Neg Hx   . Liver disease Neg Hx   . Pancreatic cancer Neg Hx    I have reviewed his medical, social, and family history in detail and updated the electronic medical record as necessary.    PHYSICAL EXAMINATION  BP 120/74   Pulse 78   Temp 98.6 F (37 C)   Ht 5\' 7"  (1.702 m)   Wt 218 lb (98.9 kg)   BMI 34.14 kg/m  Wt Readings from Last 3 Encounters:  11/09/18 218 lb (98.9 kg)  10/12/18 215 lb (97.5 kg)  09/29/18 216 lb 3.2 oz (98.1 kg)  GEN: NAD, appears stated age, doesn't appear chronically ill, accompanied by wife PSYCH: Cooperative, without pressured speech EYE: Conjunctivae pink, sclerae anicteric ENT: MMM NECK: Supple CV: RR without R/Gs  RESP: CTAB posteriorly, without wheezing GI: NABS, soft, obese, rounded, nontender, without rebound or guarding, ventral diastases present MSK/EXT: No lower extremity  edema SKIN: No jaundice NEURO:  Alert & Oriented x 3, no focal deficits   REVIEW OF DATA  I reviewed the following data at the time of this encounter:  GI Procedures and  Studies  August 2020 colonoscopy - Perianal skin tags found on perianal exam. - One 3 mm polyp in the distal sigmoid colon, removed with a cold snare. Resected and retrieved. - One 25-30 mm polyp in the rectum. Biopsied. Tattooed. - The examination was otherwise normal on direct and retroflexion views. Pathology Diagnosis 1. Colon, polyp(s), sigmoid - TUBULAR ADENOMA, NEGATIVE FOR HIGH GRADE DYSPLASIA. 2. Rectum, polyp(s), umbilicated - VILLOUS ADENOMA, NEGATIVE FOR HIGH GRADE DYSPLASIA.  Laboratory Studies  Reviewed those in epic  Imaging Studies  September 2020 CT abdomen pelvis without contrast IMPRESSION: No acute findings in the abdomen or pelvis. Scattered colonic diverticulosis.  No diverticulitis. Aortic atherosclerosis. Mild prostate enlargement. Scattered nonspecific sclerotic foci within the right iliac bone. These could be further evaluated with nuclear medicine bone scan if felt clinically indicated   ASSESSMENT  Mr. Tillerson is a 63 y.o. male with a pmh significant for ischemic cardiomyopathy (status post PCI on Brilinta), CHF, arthritis, hypertension, hyperlipidemia, sleep apnea, diabetes, colon polyps, rectal TVA.  The patient is seen today for evaluation and management of:  1. Tubulovillous adenoma of rectum   2. History of colonic polyps   3. Abnormal colonoscopy    The patient is hemodynamically and clinically stable.  Based upon the description and endoscopic pictures I do feel that it is reasonable to pursue an Advanced Polypectomy attempt of the polyp/lesion.  We discussed some of the techniques of advanced polypectomy which include Endoscopic Mucosal Resection, OVESCO Full-Thickness Resection, Endorotor Morcellation, and Tissue Ablation via Fulguration.  The risks and benefits of  endoscopic evaluation were discussed with the patient; these include but are not limited to the risk of perforation, infection, bleeding, missed lesions, lack of diagnosis, severe illness requiring hospitalization, as well as anesthesia and sedation related illnesses.  During attempts at advanced polypectomy, the risks of bleeding and perforation/leak are increased as opposed to diagnostic and screening colonoscopies, and that was discussed with the patient as well.   In addition, I explained that with the possible need for piecemeal resection, subsequent short-interval endoscopic evaluation for follow up and potential retreatment of the lesion/area may be necessary.  I did offer, a referral to surgery in order for patient to have opportunity to discuss surgical management/intervention prior to finalizing decision for attempt at endoscopic removal, however, the patient deferred on this.  If, after attempt at removal of the polyp, it is found that the patient has a complication or that an invasive lesion or malignant lesion is found, or that the polyp continues to recur, the patient is aware and understands that surgery may still be indicated/required.  All patient questions were answered, to the best of my ability, and the patient agrees to the aforementioned plan of action with follow-up as indicated.  We will work on obtaining the okay for patient to come off Brilinta as he did previously for last colonoscopy.   PLAN  Proceed with scheduling colonoscopy with EMR Obtain approval for Brilinta hold for 5 days prior to procedure Laboratories as outlined below Follow-up to be dictated after procedure is complete   Orders Placed This Encounter  Procedures  . Procedural/ Surgical Case Request: COLONOSCOPY WITH PROPOFOL  . CBC  . Basic Metabolic Panel (BMET)  . INR/PT  . Basic Metabolic Panel (BMET)  . Ambulatory referral to Gastroenterology    New Prescriptions   No medications on file   Modified  Medications   No medications on file    Planned Follow Up No follow-ups on file.  Justice Britain, MD [facility] Advanced Endoscopy Office # [phone]

## 2018-11-22 ENCOUNTER — Encounter: Payer: Self-pay | Admitting: Cardiology

## 2018-11-22 ENCOUNTER — Ambulatory Visit (INDEPENDENT_AMBULATORY_CARE_PROVIDER_SITE_OTHER): Payer: No Typology Code available for payment source | Admitting: Cardiology

## 2018-11-22 ENCOUNTER — Other Ambulatory Visit: Payer: Self-pay

## 2018-11-22 DIAGNOSIS — I255 Ischemic cardiomyopathy: Secondary | ICD-10-CM | POA: Diagnosis not present

## 2018-11-22 DIAGNOSIS — I5042 Chronic combined systolic (congestive) and diastolic (congestive) heart failure: Secondary | ICD-10-CM | POA: Diagnosis not present

## 2018-11-22 DIAGNOSIS — Z4502 Encounter for adjustment and management of automatic implantable cardiac defibrillator: Secondary | ICD-10-CM

## 2018-11-22 DIAGNOSIS — Z95810 Presence of automatic (implantable) cardiac defibrillator: Secondary | ICD-10-CM | POA: Diagnosis not present

## 2018-11-22 NOTE — Progress Notes (Signed)
Scheduled  In office ICD 11/22/18   Single (S)/Dual (D)/BV (M) M ICD Presenting APBP  Pacer dependant: No. Underlying NSR @ 60/min. Longevity 4  Years/Voltage. Charge time 3.9 Sec. AP 47%, BP 97% AMS Episodes 0.  AT/AF burden 0%. Longest 0. Latest0. HVR 0. Longest 0, Latest 0 since 10/18/18.  Lead measurements: Stable Histogram: Low (L)/normal (N)/high (H)  Good. Patient activity 1.5 hours per day. Thoracic impedance: Stable  Observations: Normal device function.   Changes: None    ICD-10-CM   1. Encounter for assessment of implantable cardioverter-defibrillator (ICD)  Z45.02   2. ICD; Biventricular  [employer]tronic ICD Amplia MRI QWuad CRT-D  in situ 10/29/14  Z95.810   3. Chronic combined systolic and diastolic heart failure (HCC)  I50.42   4. Ischemic cardiomyopathy  I25.5

## 2018-12-04 ENCOUNTER — Ambulatory Visit (INDEPENDENT_AMBULATORY_CARE_PROVIDER_SITE_OTHER): Payer: Medicare Other

## 2018-12-04 ENCOUNTER — Other Ambulatory Visit: Payer: Self-pay

## 2018-12-04 DIAGNOSIS — Z0000 Encounter for general adult medical examination without abnormal findings: Secondary | ICD-10-CM

## 2018-12-04 NOTE — Progress Notes (Signed)
Subjective:   Martin Bush is a 63 y.o. male who presents for Medicare Annual/Subsequent preventive examination.  Review of Systems:  No ROS.  Medicare Wellness Virtual Visit.  Visual/audio telehealth visit, UTA vital signs.   See social history for additional risk factors.   Cardiac Risk Factors include: advanced age (>49men, >23 women);hypertension;diabetes mellitus     Objective:    Vitals: There were no vitals taken for this visit.  There is no height or weight on file to calculate BMI.  Advanced Directives 12/04/2018 10/12/2018 09/28/2018 09/19/2018 01/07/2018 10/26/2017 09/01/2017  Does Patient Have a Medical Advance Directive? No No No No No No No  Would patient like information on creating a medical advance directive? No - Patient declined No - Patient declined - - No - Patient declined No - Patient declined No - Patient declined    Tobacco Social History   Tobacco Use  Smoking Status Never Smoker  Smokeless Tobacco Former Engineer, structural given: Not Answered   Clinical Intake:  Pre-visit preparation completed: Yes        Diabetes: Yes(Followed by the [place])  How often do you need to have someone help you when you read instructions, pamphlets, or other written materials from your doctor or pharmacy?: 1 - Never  Interpreter Needed?: No     Past Medical History:  Diagnosis Date  . Arthritis    "thumbs"  (08/02/2017)  . CHF (congestive heart failure) ([place])   . Chronic combined systolic and diastolic heart failure ([place]) 09/27/2018  . Colon polyps   . Coronary artery disease   . Depression   . High cholesterol   . Hypertension   . Ischemic cardiomyopathy 09/27/2018  . MI (myocardial infarction) ([place]) 2003  . OSA on CPAP   . Oxygen deficiency   . Pneumonia 10/2014  . Proteinuria   . Sleep apnea   . Type II diabetes mellitus (H[facility])    insulin dependent   Past Surgical History:  Procedure Laterality Date  . BIOPSY  09/19/2018   Procedure: BIOPSY;  Surgeon:  Thornton Park, MD;  Location: [facility];  Service: Gastroenterology;;  . CARDIAC CATHETERIZATION N/A 07/16/2014   Procedure: Left Heart Cath and Coronary Angiography;  Surgeon: Adrian Prows, MD;  Location: [place] [facility];  Service: Cardiovascular;  Laterality: N/A;  . CARDIAC CATHETERIZATION  07/16/2014   Procedure: Coronary Balloon Angioplasty;  Surgeon: Adrian Prows, MD;  Location: [place] [facility];  Service: Cardiovascular;;  . CARDIAC CATHETERIZATION  2003  . CARDIAC DEFIBRILLATOR PLACEMENT  2016  . CATARACT EXTRACTION W/ INTRAOCULAR LENS IMPLANT Left 03/2014  . COLONOSCOPY    . COLONOSCOPY WITH PROPOFOL N/A 09/19/2018   Procedure: COLONOSCOPY WITH PROPOFOL;  Surgeon: Thornton Park, MD;  [facility];  Service: Gastroenterology;  Laterality: N/A;  . CORONARY ANGIOPLASTY WITH STENT PLACEMENT     "I've got a total of 8 stents in there; mostly doine at [facility]" (08/02/2017)  . CORONARY ARTERY BYPASS GRAFT  ~ 2003   "CABG X2"; [facility]  . CORONARY ATHERECTOMY N/A 08/16/2017   Procedure: CORONARY ATHERECTOMY;  Surgeon: Nigel Mormon, MD;  Location: [place] [facility];  Service: Cardiovascular;  Laterality: N/A;  . CORONARY BALLOON ANGIOPLASTY N/A 08/04/2017   Procedure: CORONARY BALLOON ANGIOPLASTY;  Surgeon: Adrian Prows, MD;  Location: [place] [facility];  Service: Cardiovascular;  Laterality: N/A;  . CORONARY STENT INTERVENTION N/A 08/16/2017   Procedure: CORONARY STENT INTERVENTION;  Surgeon: Nigel Mormon, MD;  Location: [place] [facility];  Service: Cardiovascular;  Laterality: N/A;  . ELBOW SURGERY Left ?2001   "pinched nerve"  . LEFT HEART CATH AND CORS/GRAFTS ANGIOGRAPHY N/A 08/04/2017   Procedure: LEFT HEART CATH AND CORS/GRAFTS ANGIOGRAPHY;  Surgeon: Adrian Prows, MD;  Location: [place] [facility];  Service: Cardiovascular;  Laterality: N/A;  . LEFT HEART CATH AND CORS/GRAFTS ANGIOGRAPHY N/A 08/20/2017   Procedure: LEFT HEART CATH AND CORS/GRAFTS  ANGIOGRAPHY;  Surgeon: Nigel Mormon, MD;  Location: [place] [facility];  Service: Cardiovascular;  Laterality: N/A;  . POLYPECTOMY  09/19/2018   Procedure: POLYPECTOMY;  Surgeon: Thornton Park, MD;  [facility];  Service: Gastroenterology;;  . Lia Foyer INJECTION  09/19/2018   Procedure: SUBMUCOSAL INJECTION;  Surgeon: Thornton Park, MD;  [facility];  Service: Gastroenterology;;   Family History  Problem Relation Age of Onset  . Uterine cancer Mother   . Lung cancer Mother   . Hyperlipidemia Father   . Heart disease Father   . Hypertension Father   . Diabetes Father   . Colon cancer Neg Hx   . Esophageal cancer Neg Hx   . Colon polyps Neg Hx   . Rectal cancer Neg Hx   . Stomach cancer Neg Hx   . Inflammatory bowel disease Neg Hx   . Liver disease Neg Hx   . Pancreatic cancer Neg Hx    Social History   Socioeconomic History  . Marital status: Married    Spouse name: Not on file  . Number of children: Not on file  . Years of education: Not on file  . Highest education level: Not on file  Occupational History  . Not on file  Social Needs  . Financial resource strain: Not hard at all  . Food insecurity    Worry: Never true    Inability: Never true  . Transportation needs    Medical: No    Non-medical: No  Tobacco Use  . Smoking status: Never Smoker  . Smokeless tobacco: Former Network engineer and Sexual Activity  . Alcohol use: Yes    Comment: 08/02/2017 "maybe 1 drink/year"  . Drug use: Never  . Sexual activity: Not Currently  Lifestyle  . Physical activity    Days per week: 0 days    Minutes per session: Not on file  . Stress: Not at all  Relationships  . Social Herbalist on phone: Twice a week    Gets together: Twice a week    Attends religious service: More than 4 times per year    Active member of club or organization: Yes    Attends meetings of clubs or organizations: Never    Relationship status: Married   Other Topics Concern  . Not on file  Social History Narrative  . Not on file    Outpatient Encounter Medications as of 12/04/2018  Medication Sig  . amiodarone (PACERONE) 200 MG tablet Take 200 mg by mouth daily.   [name] Kitchen aspirin EC 81 MG tablet Take 81 mg by mouth daily.  . carvedilol (COREG) 12.5 MG tablet Take 6.25 mg by mouth 2 (two) times daily with a meal.  . Cholecalciferol (VITAMIN D) 2000 units tablet Take 2,000 Units by mouth 2 (two) times daily.  . empagliflozin (JARDIANCE) 25 MG TABS tablet Take 25 mg by mouth daily.  [name] Kitchen ezetimibe (ZETIA) 10 MG tablet Take 1 tablet (10 mg total) by mouth daily.  . insulin aspart protamine - aspart (NOVOLOG  MIX 70/30 FLEXPEN) (70-30) 100 UNIT/ML FlexPen Inject 60 Units into the skin See admin instructions. Inject 60 units in the morning, may inject another 60 unit dose in the evening as needed for blood sugar over 200  . isosorbide mononitrate (IMDUR) 30 MG 24 hr tablet Take 30 mg by mouth daily.  [name] Kitchen liraglutide (VICTOZA) 18 MG/3ML SOPN Inject 1.8 mg into the skin daily.  . metFORMIN (GLUCOPHAGE-XR) 500 MG 24 hr tablet Take 1,000 mg by mouth 2 (two) times daily.  . metolazone (ZAROXOLYN) 2.5 MG tablet Take 2.5 mg by mouth daily as needed (for >2 lb weight gain).  . Omega-3 Fatty Acids (FISH OIL) 1000 MG CAPS Take 1,000 mg by mouth 2 (two) times daily.   . potassium chloride SA (K-DUR,KLOR-CON) 20 MEQ tablet Take 1 tablet (20 mEq total) by mouth 2 (two) times daily.  [name] Kitchen PRESCRIPTION MEDICATION Inhale into the lungs at bedtime. CPAP  . rosuvastatin (CRESTOR) 40 MG tablet Take 40 mg by mouth at bedtime.  . sacubitril-valsartan (ENTRESTO) 97-103 MG Take 1 tablet by mouth 2 (two) times daily.   . sertraline (ZOLOFT) 100 MG tablet Take 50 mg by mouth at bedtime.   . ticagrelor (BRILINTA) 90 MG TABS tablet Take 1 tablet (90 mg total) by mouth 2 (two) times daily.  [name] Kitchen torsemide (DEMADEX) 20 MG tablet Take 1 tablet (20 mg total) by mouth 2 (two) times daily.    No facility-administered encounter medications on file as of 12/04/2018.     Activities of Daily Living In your present state of health, do you have any difficulty performing the following activities: 12/04/2018  Hearing? N  Vision? N  Difficulty concentrating or making decisions? N  Walking or climbing stairs? N  Dressing or bathing? N  Doing errands, shopping? N  Preparing Food and eating ? N  Using the Toilet? N  In the past six months, have you accidently leaked urine? N  Do you have problems with loss of bowel control? N  Managing your Medications? N  Managing your Finances? N  Housekeeping or managing your Housekeeping? N  Some recent data might be hidden    Patient Care Team: [name], MD as PCP - General (Family Medicine)   Assessment:   This is a routine wellness examination for [name].  Nurse connected with patient 12/04/18 at 10:00 AM EST by a telephone enabled telemedicine application and verified that I am speaking with the correct person using two identifiers. Patient stated full name and DOB. Patient gave permission to continue with virtual visit. Patient's location was at home and Nurse's location was at [place] office.   Health Maintenance Due: -PNA - discussed; to be completed with doctor in visit or local pharmacy.  -Foot Exam- followed by the [facility] -Hgb A1c- followed by [facility] Update all pending maintenance due as appropriate.   See completed HM at the end of note.   Eye: Visual acuity not assessed. Virtual visit. Wears corrective lenses. Followed by their ophthalmologist every 12 months.  Retinopathy- none reported  Dental: Dentures- yes  Hearing: Demonstrates normal hearing during visit.  Safety:  Patient feels safe at home- yes Patient does have smoke detectors at home- yes Patient does wear sunscreen or protective clothing when in direct sunlight - yes Patient does wear seat belt when in a moving vehicle - yes  Patient drives- yes Adequate lighting in walkways free from debris- yes Grab bars and handrails used as appropriate- yes Ambulates with no  assistive device Cell phone on person when ambulating outside of the home- yes  Social: Alcohol intake - yes      Smoking history- never   Smokers in home? none Illicit drug use? none  Depression: PHQ 2 &9 complete. See screening below. Denies irritability, anhedonia, sadness/tearfullness.    Falls: See screening below.    Medication: Taking as directed and without issues.   Covid-19: Precautions and sickness symptoms discussed. Wears mask, social distancing, hand hygiene as appropriate.   Activities of Daily Living Patient denies needing assistance with: household chores, feeding themselves, getting from bed to chair, getting to the toilet, bathing/showering, dressing, managing money, or preparing meals.   Memory: Patient is alert. Patient denies difficulty focusing or concentrating. Correctly identified the president of the [place], season and recall. Patient likes to play computer games for brain stimulation.    BMI- discussed the importance of a healthy diet, water intake and the benefits of aerobic exercise.  Educational material provided.  Physical activity- no routine. Encouraged to walk for exercise.  Diet:  Low carb Water: good intake  Other Providers Patient Care Team: [name], MD as PCP - General (Family Medicine)  Exercise Activities and Dietary recommendations Current Exercise Habits: The patient does not participate in regular exercise at present  Goals    . Increase physical activity     Stay active, walk for exercise       Fall Risk Fall Risk  12/04/2018 09/29/2018 08/30/2018 05/27/2017 04/25/2017  Falls in the past year? 0 1 0 No No  Number falls in past yr: - 0 0 - -  Injury with Fall? - 1 - - -  Follow up - Falls evaluation completed - - -   Timed Get Up and Go Performed: no, virtual visit   Depression Screen PHQ 2/9 Scores 12/04/2018 09/29/2018 08/30/2018 01/19/2018  PHQ - 2 Score 0 0 0 0  PHQ- 9 Score - 0 - -    Cognitive Function MMSE - Mini Mental State Exam 04/25/2017 03/30/2016  Orientation to time 5 5  Orientation to Place 5 5  Registration 3 3  Attention/ Calculation 5 5  Recall 3 3  Language- name 2 objects 2 2  Language- repeat 1 1  Language- follow 3 step command 3 3  Language- read & follow direction 1 1  Write a sentence 1 1  Copy design 1 1  Total score 30 30     6CIT Screen 12/04/2018  What Year? 0 points  What month? 0 points  What time? 0 points  Count back from 20 0 points  Months in reverse 0 points  Repeat phrase 0 points  Total Score 0    Immunization History  Administered Date(s) Administered  . Influenza,inj,Quad PF,6+ Mos 03/15/2016, 09/29/2018  . Influenza-Unspecified 11/26/2014  . Pneumococcal-Unspecified 11/14/2013  . Tdap 02/25/2014   Screening Tests Health Maintenance  Topic Date Due  . PNEUMOCOCCAL POLYSACCHARIDE VACCINE AGE 30-64 HIGH RISK  12/24/1957  . URINE MICROALBUMIN  12/24/1965  . OPHTHALMOLOGY EXAM  04/24/2016  . FOOT EXAM  06/18/2018  . HEMOGLOBIN A1C  03/28/2019  . TETANUS/TDAP  02/26/2024  . COLONOSCOPY  09/18/2028  . INFLUENZA VACCINE  Completed  . Hepatitis C Screening  Completed  . HIV Screening  Completed       Plan:   Keep all routine maintenance appointments.   Next scheduled lab 12/27/18  Follow up 01/01/19  Medicare Attestation I have personally reviewed: The patient's medical and social history  Their use of alcohol, tobacco or illicit drugs Their current medications and supplements The patient's functional ability including ADLs,fall risks, home safety risks, cognitive, and hearing and visual impairment Diet and physical activities Evidence for depression   In addition, I have reviewed and discussed with patient certain preventive protocols, quality metrics, and best practice recommendations. A  written personalized care plan for preventive services as well as general preventive health recommendations were provided to patient via mail.     Varney Biles, LPN  [id number]

## 2018-12-04 NOTE — Patient Instructions (Addendum)
  Martin Bush , Thank you for taking time to come for your Medicare Wellness Visit. I appreciate your ongoing commitment to your health goals. Please review the following plan we discussed and let me know if I can assist you in the future.   These are the goals we discussed: Goals    . Increase physical activity     Stay active, walk for exercise       This is a list of the screening recommended for you and due dates:  Health Maintenance  Topic Date Due  . Pneumococcal vaccine  12/24/1957  . Urine Protein Check  12/24/1965  . Eye exam for diabetics  04/24/2016  . Complete foot exam   06/18/2018  . Hemoglobin A1C  03/28/2019  . Tetanus Vaccine  02/26/2024  . Colon Cancer Screening  09/18/2028  . Flu Shot  Completed  .  Hepatitis C: One time screening is recommended by Center for Disease Control  (CDC) for  adults born from [birth year] through 1965.   Completed  . HIV Screening  Completed

## 2018-12-08 DIAGNOSIS — I5043 Acute on chronic combined systolic (congestive) and diastolic (congestive) heart failure: Secondary | ICD-10-CM | POA: Diagnosis not present

## 2018-12-11 LAB — HEMOGLOBIN A1C: Hemoglobin A1C: 14.7

## 2018-12-14 ENCOUNTER — Other Ambulatory Visit (HOSPITAL_COMMUNITY)
Admission: RE | Admit: 2018-12-14 | Discharge: 2018-12-14 | Disposition: A | Discharge status: Home / Self Care | Payer: No Typology Code available for payment source | Source: Ambulatory Visit | Attending: Gastroenterology | Admitting: Gastroenterology

## 2018-12-14 DIAGNOSIS — Z20828 Contact with and (suspected) exposure to other viral communicable diseases: Secondary | ICD-10-CM | POA: Diagnosis not present

## 2018-12-14 DIAGNOSIS — Z01812 Encounter for preprocedural laboratory examination: Secondary | ICD-10-CM | POA: Insufficient documentation

## 2018-12-15 ENCOUNTER — Other Ambulatory Visit: Payer: Self-pay

## 2018-12-15 ENCOUNTER — Encounter (HOSPITAL_COMMUNITY): Payer: Self-pay | Admitting: *Deleted

## 2018-12-15 LAB — NOVEL CORONAVIRUS, NAA (HOSP ORDER, SEND-OUT TO REF LAB; TAT 18-24 HRS): SARS-CoV-2, NAA: NOT DETECTED

## 2018-12-15 NOTE — Progress Notes (Signed)
Mr Martin Bush denies chest pain or shortness of breath.  "I have not had chest pain for over a year." Mr Martin Bush was tested for Covid 11/19, he has been in quarantine with his wife since that time.  Dr Einar Gip gave the ok to stop Brilinta and that patient is a low risk.  Mr Martin Bush has type II diabetes, he reports that CBGs run 100- high 200's.  Patient's A1C 09/28/2018 was 12.5.  I asked patient if he had  A1C checked at [facility] since the October test, he said no. I instructed patient to not take Martin Bush Sunday or Monday.  I instructed patient to not take any medications for diabetes on Monday. I instructed patient to take 70% of 70/30 SS Insulin Sunday evening. I asked Mr Martin Bush if he checks CBG - 3 times a day and he said , "well, I need to get back on it." I encouraged patient to definitely get on it, "If CBG is over 300 on Monday- they may not do the procedure."  I instructed patient to check CBG after awaking and every 2 hours until arrival  to the hospital.  I Instructed patient if CBG is less than 70 to take 4 Glucose Tablets. Recheck CBG in 15 minutes then call pre- op desk at [phone] for further instructions.   Mr Martin Bush has an ICD, I sent Peri Op device orders to Dr Einar Gip an sent email to [employer] representatives.

## 2018-12-18 ENCOUNTER — Encounter (HOSPITAL_COMMUNITY): Payer: Self-pay | Admitting: Certified Registered Nurse Anesthetist

## 2018-12-18 ENCOUNTER — Encounter: Payer: Self-pay | Admitting: Cardiology

## 2018-12-18 ENCOUNTER — Ambulatory Visit (HOSPITAL_COMMUNITY): Payer: No Typology Code available for payment source | Admitting: Certified Registered Nurse Anesthetist

## 2018-12-18 ENCOUNTER — Other Ambulatory Visit: Payer: Self-pay

## 2018-12-18 ENCOUNTER — Ambulatory Visit (HOSPITAL_COMMUNITY)
Admission: RE | Admit: 2018-12-18 | Discharge: 2018-12-18 | Disposition: A | Discharge status: Home / Self Care | Payer: No Typology Code available for payment source | Attending: Gastroenterology | Admitting: Gastroenterology

## 2018-12-18 ENCOUNTER — Encounter (HOSPITAL_COMMUNITY)
Admission: RE | Disposition: A | Discharge status: Home / Self Care | Payer: Self-pay | Source: Home / Self Care | Attending: Gastroenterology

## 2018-12-18 DIAGNOSIS — Z794 Long term (current) use of insulin: Secondary | ICD-10-CM | POA: Insufficient documentation

## 2018-12-18 DIAGNOSIS — Z951 Presence of aortocoronary bypass graft: Secondary | ICD-10-CM | POA: Diagnosis not present

## 2018-12-18 DIAGNOSIS — Z87891 Personal history of nicotine dependence: Secondary | ICD-10-CM | POA: Insufficient documentation

## 2018-12-18 DIAGNOSIS — D759 Disease of blood and blood-forming organs, unspecified: Secondary | ICD-10-CM | POA: Insufficient documentation

## 2018-12-18 DIAGNOSIS — Z955 Presence of coronary angioplasty implant and graft: Secondary | ICD-10-CM | POA: Insufficient documentation

## 2018-12-18 DIAGNOSIS — I5042 Chronic combined systolic (congestive) and diastolic (congestive) heart failure: Secondary | ICD-10-CM | POA: Insufficient documentation

## 2018-12-18 DIAGNOSIS — G4733 Obstructive sleep apnea (adult) (pediatric): Secondary | ICD-10-CM | POA: Insufficient documentation

## 2018-12-18 DIAGNOSIS — D128 Benign neoplasm of rectum: Secondary | ICD-10-CM | POA: Insufficient documentation

## 2018-12-18 DIAGNOSIS — I2510 Atherosclerotic heart disease of native coronary artery without angina pectoris: Secondary | ICD-10-CM | POA: Insufficient documentation

## 2018-12-18 DIAGNOSIS — I110 Hypertensive heart disease with heart failure: Secondary | ICD-10-CM | POA: Insufficient documentation

## 2018-12-18 DIAGNOSIS — K6289 Other specified diseases of anus and rectum: Secondary | ICD-10-CM | POA: Diagnosis not present

## 2018-12-18 DIAGNOSIS — Z7902 Long term (current) use of antithrombotics/antiplatelets: Secondary | ICD-10-CM | POA: Insufficient documentation

## 2018-12-18 DIAGNOSIS — Z95810 Presence of automatic (implantable) cardiac defibrillator: Secondary | ICD-10-CM | POA: Insufficient documentation

## 2018-12-18 DIAGNOSIS — K641 Second degree hemorrhoids: Secondary | ICD-10-CM | POA: Diagnosis not present

## 2018-12-18 DIAGNOSIS — K621 Rectal polyp: Secondary | ICD-10-CM

## 2018-12-18 DIAGNOSIS — R933 Abnormal findings on diagnostic imaging of other parts of digestive tract: Secondary | ICD-10-CM

## 2018-12-18 DIAGNOSIS — E119 Type 2 diabetes mellitus without complications: Secondary | ICD-10-CM | POA: Insufficient documentation

## 2018-12-18 DIAGNOSIS — I252 Old myocardial infarction: Secondary | ICD-10-CM | POA: Insufficient documentation

## 2018-12-18 HISTORY — PX: HEMOSTASIS CLIP PLACEMENT: SHX6857

## 2018-12-18 HISTORY — PX: SUBMUCOSAL LIFTING INJECTION: SHX6855

## 2018-12-18 HISTORY — PX: FLEXIBLE SIGMOIDOSCOPY: SHX5431

## 2018-12-18 HISTORY — PX: ENDOSCOPIC MUCOSAL RESECTION: SHX6839

## 2018-12-18 LAB — GLUCOSE, CAPILLARY
Glucose-Capillary: 123 mg/dL — ABNORMAL HIGH (ref 70–99)
Glucose-Capillary: 117 mg/dL — ABNORMAL HIGH (ref 70–99)

## 2018-12-18 SURGERY — SIGMOIDOSCOPY, FLEXIBLE
Anesthesia: Monitor Anesthesia Care

## 2018-12-18 MED ORDER — PHENYLEPHRINE 40 MCG/ML (10ML) SYRINGE FOR IV PUSH (FOR BLOOD PRESSURE SUPPORT)
PREFILLED_SYRINGE | INTRAVENOUS | Status: DC | PRN
  Administered 2018-12-18 (×3): 80 ug via INTRAVENOUS

## 2018-12-18 MED ORDER — SODIUM CHLORIDE 0.9 % IV SOLN: INTRAVENOUS | Status: DC

## 2018-12-18 MED ORDER — PROPOFOL 500 MG/50ML IV EMUL
INTRAVENOUS | Status: DC | PRN
  Administered 2018-12-18: 75 ug/kg/min via INTRAVENOUS

## 2018-12-18 MED ORDER — LACTATED RINGERS IV SOLN
INTRAVENOUS | Status: AC | PRN
  Administered 2018-12-18: 1000 mL via INTRAVENOUS

## 2018-12-18 MED ORDER — PROPOFOL 10 MG/ML IV BOLUS
INTRAVENOUS | Status: DC | PRN
  Administered 2018-12-18 (×2): 10 mg via INTRAVENOUS

## 2018-12-18 MED ORDER — PHENYLEPHRINE HCL-NACL 10-0.9 MG/250ML-% IV SOLN
INTRAVENOUS | Status: DC | PRN
  Administered 2018-12-18: 60 ug/min via INTRAVENOUS

## 2018-12-18 MED ORDER — LACTATED RINGERS IV SOLN
INTRAVENOUS | Status: DC | PRN
  Administered 2018-12-18: 10:00:00 via INTRAVENOUS

## 2018-12-18 SURGICAL SUPPLY — 22 items

## 2018-12-18 NOTE — Progress Notes (Signed)
Per crna/bell, ICD needs no post procedure interrogation/intervention

## 2018-12-18 NOTE — Anesthesia Preprocedure Evaluation (Addendum)
Anesthesia Evaluation  Patient identified by MRN, date of birth, ID band Patient awake    Reviewed: Allergy & Precautions, NPO status , Patient's Chart, lab work & pertinent test results, reviewed documented beta blocker date and time   Airway Mallampati: II  TM Distance: >3 FB Neck ROM: Full    Dental no notable dental hx. (+) Edentulous Upper, Dental Advisory Given   Pulmonary sleep apnea and Continuous Positive Airway Pressure Ventilation , former smoker,    Pulmonary exam normal breath sounds clear to auscultation       Cardiovascular hypertension, Pt. on home beta blockers and Pt. on medications + CAD, + Past MI, + Cardiac Stents (2019), + CABG (2003) and +CHF  Normal cardiovascular exam+ Cardiac Defibrillator  Rhythm:Regular Rate:Normal  TTE 2019 - Left ventricle: The cavity size was mildly dilated. Wall   thickness was normal. Systolic function was severely reduced. The estimated ejection fraction was in the range of 20% to 25%. Moderate global hypokinesis. Severe distal anteroseptal hypokinesis. Severe inferolateral hypokinesis. Apical akinesis. Features are consistent with a pseudonormal left ventricular filling pattern, with concomitant abnormal relaxation and increased filling pressure (grade 2 diastolic dysfunction). - Right ventricle: Systolic function was mildly reduced. - Right atrium: Central venous pressure (est): 3 mm Hg. - This was a limited study to assess systolic and diastolic   Function. - no significant valvular abnormalities  LHC 2019 Left main: Patent stent, 0% restenosis/stent thrombosis LAD: Proximally occluded. LIMA-LAD: Patent Left circumflex: Dominant, patent proximal left PDA stent. Mild distal disease RCA: Nondominant. Severe diffuse disease SVG to left PDA: Known occluded LVEDP 28 mmHg Impression: Pulmonary edema with non-STEMI, likely precipitated by heart failure. No acute stent thrombosis.  Unchanged coronary anatomy with severe native vessel disease, 1 out of 2 grafts patent.   Neuro/Psych negative neurological ROS  negative psych ROS   GI/Hepatic negative GI ROS, Neg liver ROS,   Endo/Other  negative endocrine ROSdiabetes, Insulin Dependent, Oral Hypoglycemic Agents  Renal/GU negative Renal ROS  negative genitourinary   Musculoskeletal negative musculoskeletal ROS (+)   Abdominal   Peds  Hematology  (+) Blood dyscrasia (on Brilinta), ,   Anesthesia Other Findings   Reproductive/Obstetrics                           Anesthesia Physical Anesthesia Plan  ASA: IV  Anesthesia Plan: MAC   Post-op Pain Management:    Induction: Intravenous  PONV Risk Score and Plan: 1 and Propofol infusion and Treatment may vary due to age or medical condition  Airway Management Planned: Natural Airway  Additional Equipment:   Intra-op Plan:   Post-operative Plan:   Informed Consent: I have reviewed the patients History and Physical, chart, labs and discussed the procedure including the risks, benefits and alternatives for the proposed anesthesia with the patient or authorized representative who has indicated his/her understanding and acceptance.     Dental advisory given  Plan Discussed with: CRNA  Anesthesia Plan Comments:         Anesthesia Quick Evaluation

## 2018-12-18 NOTE — Discharge Instructions (Signed)
Aspirin can be restarted tomorrow. Brillanta can be restarted on Thursday PM/Evening.

## 2018-12-18 NOTE — H&P (Signed)
GASTROENTEROLOGY PROCEDURE H&P NOTE   Primary Care Physician: [name], MD  HPI: Martin Bush is a 63 y.o. male who presents for Colonoscopy with EMR attempt.  Past Medical History:  Diagnosis Date  . AICD (automatic cardioverter/defibrillator) present    Medtronic  . Arthritis    "thumbs"  (08/02/2017)  . CHF (congestive heart failure) ([place])   . Chronic combined systolic and diastolic heart failure ([place]) 09/27/2018  . Colon polyps   . Coronary artery disease   . Depression   . Encounter for assessment of implantable cardioverter-defibrillator (ICD) 09/27/2018  . High cholesterol   . Hypertension   . ICD; Biventricular  Medtronic ICD Amplia MRI QWuad CRT-D  in situ 10/29/14 10/29/2014   Remote ICD check 09.23.20:  One 6 beat NSVT. No therapy.  1 SVT episode @ 130 bpm (38 Sec).  There were 23 Vent sense episodes detected for up to 1.1 min/day (AT). Health trends (patient activity, heart rate variability, average heart rates) are stable.Trans-thoracic impedance trends and the OptiVol Fluid Index do no present significant abnormalities. Battery longevity is 4.3 years. RA pacing is 3  . Ischemic cardiomyopathy 09/27/2018  . MI (myocardial infarction) ([place]) 2003  . OSA on CPAP   . Oxygen deficiency   . Pneumonia 10/2014  . Proteinuria   . Sleep apnea   . Type II diabetes mellitus (HCC)    insulin dependent   Past Surgical History:  Procedure Laterality Date  . BIOPSY  09/19/2018   Procedure: BIOPSY;  Surgeon: Thornton Park, MD;  Location: [facility];  Service: Gastroenterology;;  . CARDIAC CATHETERIZATION N/A 07/16/2014   Procedure: Left Heart Cath and Coronary Angiography;  Surgeon: Adrian Prows, MD;  Location: [place] [facility];  Service: Cardiovascular;  Laterality: N/A;  . CARDIAC CATHETERIZATION  07/16/2014   Procedure: Coronary Balloon Angioplasty;  Surgeon: Adrian Prows, MD;  Location: [place] [facility];  Service: Cardiovascular;;  . CARDIAC CATHETERIZATION  2003  .  CARDIAC DEFIBRILLATOR PLACEMENT  2016  . CATARACT EXTRACTION W/ INTRAOCULAR LENS IMPLANT Left 03/2014  . COLONOSCOPY    . COLONOSCOPY WITH PROPOFOL N/A 09/19/2018   Procedure: COLONOSCOPY WITH PROPOFOL;  Surgeon: Thornton Park, MD;  Location: [facility];  Service: Gastroenterology;  Laterality: N/A;  . CORONARY ANGIOPLASTY WITH STENT PLACEMENT     "I've got a total of 8 stents in there; mostly doine at [facility]" (08/02/2017)  . CORONARY ARTERY BYPASS GRAFT  ~ 2003   "CABG X2"; [facility]  . CORONARY ATHERECTOMY N/A 08/16/2017   Procedure: CORONARY ATHERECTOMY;  Surgeon: Nigel Mormon, MD;  Location: [place] [facility];  Service: Cardiovascular;  Laterality: N/A;  . CORONARY BALLOON ANGIOPLASTY N/A 08/04/2017   Procedure: CORONARY BALLOON ANGIOPLASTY;  Surgeon: Adrian Prows, MD;  Location: [place] [facility];  Service: Cardiovascular;  Laterality: N/A;  . CORONARY STENT INTERVENTION N/A 08/16/2017   Procedure: CORONARY STENT INTERVENTION;  Surgeon: Nigel Mormon, MD;  Location: [place] [facility];  Service: Cardiovascular;  Laterality: N/A;  . ELBOW SURGERY Left ?2001   "pinched nerve"  . LEFT HEART CATH AND CORS/GRAFTS ANGIOGRAPHY N/A 08/04/2017   Procedure: LEFT HEART CATH AND CORS/GRAFTS ANGIOGRAPHY;  Surgeon: Adrian Prows, MD;  Location: [place] [facility];  Service: Cardiovascular;  Laterality: N/A;  . LEFT HEART CATH AND CORS/GRAFTS ANGIOGRAPHY N/A 08/20/2017   Procedure: LEFT HEART CATH AND CORS/GRAFTS ANGIOGRAPHY;  Surgeon: Nigel Mormon, MD;  Location: [place] [facility];  Service: Cardiovascular;  Laterality:  N/A;  . POLYPECTOMY  09/19/2018   Procedure: POLYPECTOMY;  Surgeon: Thornton Park, MD;  [facility];  Service: Gastroenterology;;  . Lia Foyer INJECTION  09/19/2018   Procedure: SUBMUCOSAL INJECTION;  Surgeon: Thornton Park, MD;  Location: [facility];  Service: Gastroenterology;;   Current Facility-Administered Medications   Medication Dose Route Frequency Provider Last Rate Last Dose  . 0.9 %  sodium chloride infusion   Intravenous Continuous Mansouraty, Telford Nab., MD      . lactated ringers infusion    Continuous PRN Mansouraty, Telford Nab., MD   1,000 mL at 12/18/18 0942   Allergies  Allergen Reactions  . Diltiazem Rash   Family History  Problem Relation Age of Onset  . Uterine cancer Mother   . Lung cancer Mother   . Hyperlipidemia Father   . Heart disease Father   . Hypertension Father   . Diabetes Father   . Colon cancer Neg Hx   . Esophageal cancer Neg Hx   . Colon polyps Neg Hx   . Rectal cancer Neg Hx   . Stomach cancer Neg Hx   . Inflammatory bowel disease Neg Hx   . Liver disease Neg Hx   . Pancreatic cancer Neg Hx    Social History   Socioeconomic History  . Marital status: Married    Spouse name: Not on file  . Number of children: Not on file  . Years of education: Not on file  . Highest education level: Not on file  Occupational History  . Not on file  Social Needs  . Financial resource strain: Not hard at all  . Food insecurity    Worry: Never true    Inability: Never true  . Transportation needs    Medical: No    Non-medical: No  Tobacco Use  . Smoking status: Former Smoker    Years: 27.00    Types: Cigars    Quit date: 2002    Years since quitting: 18.9  . Smokeless tobacco: Former Systems developer    Quit date: 2002  Substance and Sexual Activity  . Alcohol use: Not Currently  . Drug use: Never  . Sexual activity: Not Currently  Lifestyle  . Physical activity    Days per week: 0 days    Minutes per session: Not on file  . Stress: Not at all  Relationships  . Social Herbalist on phone: Twice a week    Gets together: Twice a week    Attends religious service: More than 4 times per year    Active member of club or organization: Yes    Attends meetings of clubs or organizations: Never    Relationship status: Married  . Intimate partner violence    Fear  of current or ex partner: No    Emotionally abused: No    Physically abused: No    Forced sexual activity: No  Other Topics Concern  . Not on file  Social History Narrative  . Not on file    Physical Exam: Vital signs in last 24 hours: Temp:  [97.4 F (36.3 C)] 97.4 F (36.3 C) (11/23 0905) Resp:  [19] 19 (11/23 0905) BP: (146)/(76) 146/76 (11/23 0905) SpO2:  [96 %] 96 % (11/23 0905) Weight:  [97.5 kg] 97.5 kg (11/23 0905)   GEN: NAD EYE: Sclerae anicteric ENT: MMM CV: Non-tachycardic GI: Soft, NT/ND NEURO:  Alert & Oriented x 3  Lab Results: No results for input(s): WBC, HGB, HCT, PLT in the  last 72 hours. BMET No results for input(s): NA, K, CL, CO2, GLUCOSE, BUN, CREATININE, CALCIUM in the last 72 hours. LFT No results for input(s): PROT, ALBUMIN, AST, ALT, ALKPHOS, BILITOT, BILIDIR, IBILI in the last 72 hours. PT/INR No results for input(s): LABPROT, INR in the last 72 hours.   Impression / Plan: This is a 63 y.o.male who presents for Colonoscopy with EMR attempt.  The risks and benefits of endoscopic evaluation were discussed with the patient; these include but are not limited to the risk of perforation, infection, bleeding, missed lesions, lack of diagnosis, severe illness requiring hospitalization, as well as anesthesia and sedation related illnesses.  The patient is agreeable to proceed.    Justice Britain, MD [facility] Advanced [facility]oscopy Office # [phone]

## 2018-12-18 NOTE — Anesthesia Procedure Notes (Signed)
Procedure Name: MAC Date/Time: 12/18/2018 10:25 AM Performed by: Harden Mo, CRNA Pre-anesthesia Checklist: Patient identified, Emergency Drugs available, Suction available and Patient being monitored Patient Re-evaluated:Patient Re-evaluated prior to induction Oxygen Delivery Method: Simple face mask Preoxygenation: Pre-oxygenation with 100% oxygen Induction Type: IV induction Placement Confirmation: positive ETCO2 and breath sounds checked- equal and bilateral Dental Injury: Teeth and Oropharynx as per pre-operative assessment

## 2018-12-18 NOTE — Op Note (Signed)
[facility] Patient Name: Martin Bush Procedure Date : 12/18/2018 MRN: [id number] Attending MD: Martin Bush , MD Date of Birth: 01/30/1955 CSN: [id number] Age: 63 Admit Type: Outpatient Procedure:                Flexible Sigmoidoscopy Indications:              For therapy of adenomatous polyps in the rectum Providers:                Martin Britain, MD, Vista Lawman, RN, Elspeth Cho Tech., Technician, Garrison Columbus, CRNA Referring MD:             Thornton Park MD, MD, Angela Adam. [place] Medicines:                Monitored Anesthesia Care Complications:            No immediate complications. Estimated Blood Loss:     Estimated blood loss was minimal. Procedure:                Pre-Anesthesia Assessment:                           - Prior to the procedure, a History and Physical                            was performed, and patient medications and                            allergies were reviewed. The patient's tolerance of                            previous anesthesia was also reviewed. The risks                            and benefits of the procedure and the sedation                            options and risks were discussed with the patient.                            All questions were answered, and informed consent                            was obtained. Prior Anticoagulants: The patient                            last took Plavix (clopidogrel) 5 days prior to the                            procedure and has taken no previous anticoagulant                            or antiplatelet agents except for aspirin. ASA  Grade Assessment: III - A patient with severe                            systemic disease. After reviewing the risks and                            benefits, the patient was deemed in satisfactory                            condition to undergo the procedure.  After obtaining informed consent, the scope was                            passed under direct vision. The [id number]                            ([id number]) Olympus therapeutic gastroscope was                            introduced through the anus and advanced to the the                            descending colon. After obtaining informed consent,                            the scope was passed under direct vision.The                            flexible sigmoidoscopy was technically difficult                            and complex. Successful completion of the procedure                            was aided by performing the maneuvers documented                            (below) in this report. The quality of the bowel                            preparation was adequate. Scope In: 10:36:14 AM Scope Out: 11:42:23 AM Total Procedure Duration: 1 hour 6 minutes 9 seconds  Findings:      The digital rectal exam findings include hemorrhoids.      A moderate amount of semi-liquid semi-solid stool was found in the       entire colon, interfering with visualization. Lavage of the area was       performed using copious amounts, resulting in clearance with adequate       visualization.      A 40 mm polyp was found in the distal rectum. The polyp was semi-sessile       and granular. Preparations were made for mucosal resection. Orise gel       was injected to raise the lesion. Piecemeal mucosal resection using a       snare was performed. Resection and retrieval were  complete. Coagulation       for tissue destruction using snare tip was successful to the edge of the       resection site. To prevent bleeding after mucosal resection, nine       hemostatic clips were successfully placed (MR conditional).       Unfortunately, the lesion is too large for zip closure. There was no       bleeding at the end of the procedure.      AN anal papilla was hypertrophied and found.      Non-bleeding non-thrombosed  internal hemorrhoids were found during       retroflexion, during perianal exam and during digital exam. The       hemorrhoids were Grade II (internal hemorrhoids that prolapse but reduce       spontaneously). Impression:               - Hemorrhoids found on digital rectal exam.                           - Stool in the entire examined colon.                           - One 40 mm polyp in the distal rectum, removed                            with piecemeal mucosal resection. Resected and                            retrieved. Treated with a hot snare tip to edge.                            Clips (MR conditional) were placed.                           - Anal papilla.                           - Non-bleeding non-thrombosed internal hemorrhoids. Recommendation:           - The patient will be observed post-procedure,                            until all discharge criteria are met.                           - Discharge patient to home.                           - Patient has a contact number available for                            emergencies. The signs and symptoms of potential                            delayed complications were discussed with the  patient. Return to normal activities tomorrow.                            Written discharge instructions were provided to the                            patient.                           - High fiber diet.                           - Await pathology results.                           - Repeat flexible sigmoidoscopy in 6 months for                            surveillance after piecemeal mucosectomy.                           - May restart Aspirin tomorrow.                           [name] should be restarted no sooner than 72                            hours (Thursday PM) to decrease risk of                            post-mucosectomy bleeding.                           - Monitor for signs/symptoms of bleeding,                             perforation, and infection. If issues please call                            our number to get further assistance as needed.                           - The findings and recommendations were discussed                            with the patient.                           - The findings and recommendations were discussed                            with the patient's family. Procedure Code(s):        --- Professional ---                           [phone], Sigmoidoscopy, flexible; with endoscopic  mucosal resection Diagnosis Code(s):        --- Professional ---                           K64.1, Second degree hemorrhoids                           K62.1, Rectal polyp                           K62.89, Other specified diseases of anus and rectum                           D12.8, Benign neoplasm of rectum CPT copyright 2019 American Medical Association. All rights reserved. The codes documented in this report are preliminary and upon coder review may  be revised to meet current compliance requirements. Martin Britain, MD 12/18/2018 12:05:25 PM Number of Addenda: 0

## 2018-12-18 NOTE — Transfer of Care (Signed)
Immediate Anesthesia Transfer of Care Note  Patient: Martin Bush  Procedure(s) Performed: FLEXIBLE SIGMOIDOSCOPY (N/A ) ENDOSCOPIC MUCOSAL RESECTION SUBMUCOSAL LIFTING INJECTION HEMOSTASIS CLIP PLACEMENT  Patient Location: PACU  Anesthesia Type:MAC  Level of Consciousness: awake, alert  and oriented  Airway & Oxygen Therapy: Patient Spontanous Breathing and Patient connected to face mask oxygen  Post-op Assessment: Report given to RN, Post -op Vital signs reviewed and stable and Patient moving all extremities X 4  Post vital signs: Reviewed and stable  Last Vitals:  Vitals Value Taken Time  BP    Temp    Pulse    Resp    SpO2      Last Pain:  Vitals:   12/18/18 0905  TempSrc: Temporal  PainSc: 0-No pain         Complications: No apparent anesthesia complications

## 2018-12-19 LAB — SURGICAL PATHOLOGY

## 2018-12-19 NOTE — Anesthesia Postprocedure Evaluation (Signed)
Anesthesia Post Note  Patient: Martin Bush  Procedure(s) Performed: FLEXIBLE SIGMOIDOSCOPY (N/A ) ENDOSCOPIC MUCOSAL RESECTION SUBMUCOSAL LIFTING INJECTION HEMOSTASIS CLIP PLACEMENT     Patient location during evaluation: Endoscopy Anesthesia Type: MAC Level of consciousness: awake and alert Pain management: pain level controlled Vital Signs Assessment: post-procedure vital signs reviewed and stable Respiratory status: spontaneous breathing, nonlabored ventilation, respiratory function stable and patient connected to nasal cannula oxygen Cardiovascular status: blood pressure returned to baseline and stable Postop Assessment: no apparent nausea or vomiting Anesthetic complications: no    Last Vitals:  Vitals:   12/18/18 1220 12/18/18 1230  BP: 115/68 124/74  Pulse: (!) 59   Resp: 14   Temp: (!) 36.1 C   SpO2: 98% 99%    Last Pain:  Vitals:   12/18/18 1154  TempSrc:   PainSc: 0-No pain   Pain Goal:                   Martin Bush L Martin Bush

## 2018-12-20 ENCOUNTER — Encounter: Payer: Self-pay | Admitting: Gastroenterology

## 2018-12-20 ENCOUNTER — Telehealth: Payer: Self-pay | Admitting: Gastroenterology

## 2018-12-20 ENCOUNTER — Other Ambulatory Visit: Payer: Self-pay | Admitting: Gastroenterology

## 2018-12-20 ENCOUNTER — Other Ambulatory Visit (INDEPENDENT_AMBULATORY_CARE_PROVIDER_SITE_OTHER): Payer: No Typology Code available for payment source

## 2018-12-20 DIAGNOSIS — R933 Abnormal findings on diagnostic imaging of other parts of digestive tract: Secondary | ICD-10-CM

## 2018-12-20 DIAGNOSIS — I5042 Chronic combined systolic (congestive) and diastolic (congestive) heart failure: Secondary | ICD-10-CM

## 2018-12-20 DIAGNOSIS — Z95810 Presence of automatic (implantable) cardiac defibrillator: Secondary | ICD-10-CM | POA: Diagnosis not present

## 2018-12-20 DIAGNOSIS — Z4502 Encounter for adjustment and management of automatic implantable cardiac defibrillator: Secondary | ICD-10-CM

## 2018-12-20 DIAGNOSIS — D128 Benign neoplasm of rectum: Secondary | ICD-10-CM

## 2018-12-20 LAB — BASIC METABOLIC PANEL
BUN: 13 mg/dL (ref 6–23)
CO2: 26 mEq/L (ref 19–32)
Calcium: 9.4 mg/dL (ref 8.4–10.5)
Chloride: 101 mEq/L (ref 96–112)
Creatinine, Ser: 1.26 mg/dL (ref 0.40–1.50)
GFR: 57.8 mL/min — ABNORMAL LOW (ref >60.00)
Glucose, Bld: 224 mg/dL — ABNORMAL HIGH (ref 70–99)
Potassium: 4.1 mEq/L (ref 3.5–5.1)
Sodium: 136 mEq/L (ref 135–145)

## 2018-12-20 NOTE — Telephone Encounter (Signed)
Pt's wife advised that labs need to be done today.

## 2018-12-25 ENCOUNTER — Other Ambulatory Visit: Payer: Self-pay

## 2018-12-27 ENCOUNTER — Other Ambulatory Visit: Payer: Self-pay

## 2018-12-27 ENCOUNTER — Other Ambulatory Visit (INDEPENDENT_AMBULATORY_CARE_PROVIDER_SITE_OTHER): Payer: No Typology Code available for payment source

## 2018-12-27 ENCOUNTER — Other Ambulatory Visit: Payer: Self-pay | Admitting: Family Medicine

## 2018-12-27 DIAGNOSIS — N183 Chronic kidney disease, stage 3 unspecified: Secondary | ICD-10-CM

## 2018-12-27 DIAGNOSIS — E785 Hyperlipidemia, unspecified: Secondary | ICD-10-CM | POA: Diagnosis not present

## 2018-12-27 DIAGNOSIS — E1165 Type 2 diabetes mellitus with hyperglycemia: Secondary | ICD-10-CM

## 2018-12-27 DIAGNOSIS — E1122 Type 2 diabetes mellitus with diabetic chronic kidney disease: Secondary | ICD-10-CM

## 2018-12-27 DIAGNOSIS — IMO0002 Reserved for concepts with insufficient information to code with codable children: Secondary | ICD-10-CM

## 2018-12-27 LAB — LIPID PANEL
Cholesterol: 103 mg/dL (ref 0–200)
HDL: 32.1 mg/dL — ABNORMAL LOW (ref >39.00)
LDL Cholesterol: 34 mg/dL (ref 0–99)
NonHDL: 70.75
Total CHOL/HDL Ratio: 3
Triglycerides: 182 mg/dL — ABNORMAL HIGH (ref 0.0–149.0)
VLDL: 36.4 mg/dL (ref 0.0–40.0)

## 2018-12-27 LAB — COMPREHENSIVE METABOLIC PANEL
ALT: 11 U/L (ref 0–53)
AST: 10 U/L (ref 0–37)
Albumin: 3.8 g/dL (ref 3.5–5.2)
Alkaline Phosphatase: 79 U/L (ref 39–117)
BUN: 26 mg/dL — ABNORMAL HIGH (ref 6–23)
CO2: 22 mEq/L (ref 19–32)
Calcium: 9.1 mg/dL (ref 8.4–10.5)
Chloride: 102 mEq/L (ref 96–112)
Creatinine, Ser: 1.3 mg/dL (ref 0.40–1.50)
GFR: 55.75 mL/min — ABNORMAL LOW (ref >60.00)
Glucose, Bld: 370 mg/dL — ABNORMAL HIGH (ref 70–99)
Potassium: 3.8 mEq/L (ref 3.5–5.1)
Sodium: 132 mEq/L — ABNORMAL LOW (ref 135–145)
Total Bilirubin: 0.4 mg/dL (ref 0.2–1.2)
Total Protein: 6.6 g/dL (ref 6.0–8.3)

## 2018-12-27 LAB — HEMOGLOBIN A1C: Hgb A1c MFr Bld: 11.6 % — ABNORMAL HIGH (ref 4.6–6.5)

## 2018-12-29 NOTE — Telephone Encounter (Signed)
Please let the patient know that I heard from his GI physician. She updated me on his CT scan and they noted that there were some nonspecific findings in some of his bones. They recommended a bone scan. I have placed an order for this. Please attempt to get this approved by his insurance. Caryl Bis

## 2019-01-01 ENCOUNTER — Encounter: Payer: Self-pay | Admitting: Family Medicine

## 2019-01-01 ENCOUNTER — Other Ambulatory Visit: Payer: Self-pay

## 2019-01-01 ENCOUNTER — Ambulatory Visit (INDEPENDENT_AMBULATORY_CARE_PROVIDER_SITE_OTHER): Payer: No Typology Code available for payment source | Admitting: Family Medicine

## 2019-01-01 VITALS — Ht 67.0 in | Wt 217.0 lb

## 2019-01-01 DIAGNOSIS — IMO0002 Reserved for concepts with insufficient information to code with codable children: Secondary | ICD-10-CM

## 2019-01-01 DIAGNOSIS — I5022 Chronic systolic (congestive) heart failure: Secondary | ICD-10-CM | POA: Diagnosis not present

## 2019-01-01 DIAGNOSIS — Z9989 Dependence on other enabling machines and devices: Secondary | ICD-10-CM

## 2019-01-01 DIAGNOSIS — E1122 Type 2 diabetes mellitus with diabetic chronic kidney disease: Secondary | ICD-10-CM

## 2019-01-01 DIAGNOSIS — N183 Chronic kidney disease, stage 3 unspecified: Secondary | ICD-10-CM

## 2019-01-01 DIAGNOSIS — G4733 Obstructive sleep apnea (adult) (pediatric): Secondary | ICD-10-CM

## 2019-01-01 DIAGNOSIS — E1165 Type 2 diabetes mellitus with hyperglycemia: Secondary | ICD-10-CM | POA: Diagnosis not present

## 2019-01-01 NOTE — Assessment & Plan Note (Signed)
Doing well.  He will continue his current regimen and follow-up with cardiology.

## 2019-01-01 NOTE — Assessment & Plan Note (Signed)
He will continue his CPAP through the [place].

## 2019-01-01 NOTE — Progress Notes (Signed)
Virtual Visit via telephone Note  This visit type was conducted due to national recommendations for restrictions regarding the COVID-19 pandemic (e.g. social distancing).  This format is felt to be most appropriate for this patient at this time.  All issues noted in this document were discussed and addressed.  No physical exam was performed (except for noted visual exam findings with Video Visits).   I connected with Martin Bush today at  3:30 PM EST by telephone and verified that I am speaking with the correct person using two identifiers. Location patient: home Location provider: home office Persons participating in the virtual visit: patient, provider  I discussed the limitations, risks, security and privacy concerns of performing an evaluation and management service by telephone and the availability of in person appointments. I also discussed with the patient that there may be a patient responsible charge related to this service. The patient expressed understanding and agreed to proceed.  Interactive audio and video telecommunications were attempted between this provider and patient, however failed, due to patient having technical difficulties OR patient did not have access to video capability.  We continued and completed visit with audio only.   Reason for visit: follow-up  HPI: Diabetes: Typically running 100s-300s.  A1c remains uncontrolled.  He continues on Jardiance though notes the [place] seems to have cut his dose in half.  He continues on Victoza, Metformin, and NovoLog 70/30.  He takes 63 units every morning of the 70/30 mix and he will take the same dose in the evening as needed for elevated blood sugars.  No polyuria or polydipsia.  He will rarely have hypoglycemia and he will take a sugar tablet for that with good benefit.  He sees ophthalmology [facility] the [facility] every 3 months.  OSA: Continues on his CPAP.  He reports he had this rechecked through the [place] recently and they noted he still  needed the CPAP.  Wears it nightly for 6 to 8 hours.  No hypersomnia.  He does wake up well rested.  CHF: He continues on current medications through cardiology.  He has follow-up with cardiology later this month.  No chest pain, shortness of breath, edema, orthopnea, or PND.   ROS: See pertinent positives and negatives per HPI.  Past Medical History:  Diagnosis Date  . AICD (automatic cardioverter/defibrillator) present    Medtronic  . Arthritis    "thumbs"  (08/02/2017)  . CHF (congestive heart failure) ([place])   . Chronic combined systolic and diastolic heart failure ([place]) 09/27/2018  . Colon polyps   . Coronary artery disease   . Depression   . Encounter for assessment of implantable cardioverter-defibrillator (ICD) 09/27/2018  . High cholesterol   . Hypertension   . ICD; Biventricular  Medtronic ICD Amplia MRI QWuad CRT-D  in situ 10/29/14 10/29/2014   Remote ICD check 09.23.20:  One 6 beat NSVT. No therapy.  1 SVT episode @ 130 bpm (38 Sec).  There were 23 Vent sense episodes detected for up to 1.1 min/day (AT). Health trends (patient activity, heart rate variability, average heart rates) are stable.Trans-thoracic impedance trends and the OptiVol Fluid Index do no present significant abnormalities. Battery longevity is 4.3 years. RA pacing is 3  . Ischemic cardiomyopathy 09/27/2018  . MI (myocardial infarction) ([place]) 2003  . NSTEMI (non-ST elevated myocardial infarction) ([place]) 07/16/2014  . OSA on CPAP   . Oxygen deficiency   . Pneumonia 10/2014  . Proteinuria   . Sleep apnea   . Type II diabetes  mellitus ([place])    insulin dependent    Past Surgical History:  Procedure Laterality Date  . BIOPSY  09/19/2018   Procedure: BIOPSY;  Surgeon: Thornton Park, MD;  Location: [facility];  Service: Gastroenterology;;  . CARDIAC CATHETERIZATION N/A 07/16/2014   Procedure: Left Heart Cath and Coronary Angiography;  Surgeon: Adrian Prows, MD;  Location: [place] [facility];  Service:  Cardiovascular;  Laterality: N/A;  . CARDIAC CATHETERIZATION  07/16/2014   Procedure: Coronary Balloon Angioplasty;  Surgeon: Adrian Prows, MD;  Location: [place] [facility];  Service: Cardiovascular;;  . CARDIAC CATHETERIZATION  2003  . CARDIAC DEFIBRILLATOR PLACEMENT  2016  . CATARACT EXTRACTION W/ INTRAOCULAR LENS IMPLANT Left 03/2014  . COLONOSCOPY    . COLONOSCOPY WITH PROPOFOL N/A 09/19/2018   Procedure: COLONOSCOPY WITH PROPOFOL;  Surgeon: Thornton Park, MD;  Location: [facility];  Service: Gastroenterology;  Laterality: N/A;  . CORONARY ANGIOPLASTY WITH STENT PLACEMENT     "I've got a total of 8 stents in there; mostly doine at [facility]" (08/02/2017)  . CORONARY ARTERY BYPASS GRAFT  ~ 2003   "CABG X2"; [employer]  . CORONARY ATHERECTOMY N/A 08/16/2017   Procedure: CORONARY ATHERECTOMY;  Surgeon: Nigel Mormon, MD;  Location: [place] [facility];  Service: Cardiovascular;  Laterality: N/A;  . CORONARY BALLOON ANGIOPLASTY N/A 08/04/2017   Procedure: CORONARY BALLOON ANGIOPLASTY;  Surgeon: Adrian Prows, MD;  Location: [place] [facility];  Service: Cardiovascular;  Laterality: N/A;  . CORONARY STENT INTERVENTION N/A 08/16/2017   Procedure: CORONARY STENT INTERVENTION;  Surgeon: Nigel Mormon, MD;  Location: [place] [facility];  Service: Cardiovascular;  Laterality: N/A;  . ELBOW SURGERY Left ?2001   "pinched nerve"  . ENDOSCOPIC MUCOSAL RESECTION  12/18/2018   Procedure: ENDOSCOPIC MUCOSAL RESECTION;  Surgeon: Rush Landmark Telford Nab., MD;  Location: [place];  Service: Gastroenterology;;  . Otho Darner SIGMOIDOSCOPY N/A 12/18/2018   Procedure: Beryle Quant;  Surgeon: Irving Copas., MD;  Location: [place];  Service: Gastroenterology;  Laterality: N/A;  . HEMOSTASIS CLIP PLACEMENT  12/18/2018   Procedure: HEMOSTASIS CLIP PLACEMENT;  Surgeon: Irving Copas., MD;  Location: [place];  Service: Gastroenterology;;  . LEFT HEART CATH AND  CORS/GRAFTS ANGIOGRAPHY N/A 08/04/2017   Procedure: LEFT HEART CATH AND CORS/GRAFTS ANGIOGRAPHY;  Surgeon: Adrian Prows, MD;  Location: [place] [facility];  Service: Cardiovascular;  Laterality: N/A;  . LEFT HEART CATH AND CORS/GRAFTS ANGIOGRAPHY N/A 08/20/2017   Procedure: LEFT HEART CATH AND CORS/GRAFTS ANGIOGRAPHY;  Surgeon: Nigel Mormon, MD;  Location: [place] [facility];  Service: Cardiovascular;  Laterality: N/A;  . POLYPECTOMY  09/19/2018   Procedure: POLYPECTOMY;  Surgeon: Thornton Park, MD;  Location: [facility];  Service: Gastroenterology;;  . Lia Foyer INJECTION  09/19/2018   Procedure: SUBMUCOSAL INJECTION;  Surgeon: Thornton Park, MD;  Location: [facility];  Service: Gastroenterology;;  . Lia Foyer LIFTING INJECTION  12/18/2018   Procedure: SUBMUCOSAL LIFTING INJECTION;  Surgeon: Irving Copas., MD;  Location: [facility];  Service: Gastroenterology;;    Family History  Problem Relation Age of Onset  . Uterine cancer Mother   . Lung cancer Mother   . Hyperlipidemia Father   . Heart disease Father   . Hypertension Father   . Diabetes Father   . Colon cancer Neg Hx   . Esophageal cancer Neg Hx   . Colon polyps Neg Hx   . Rectal cancer Neg Hx   . Stomach cancer Neg Hx   . Inflammatory bowel  disease Neg Hx   . Liver disease Neg Hx   . Pancreatic cancer Neg Hx     SOCIAL HX: Former smoker   Current Outpatient Medications:  .  amiodarone (PACERONE) 200 MG tablet, Take 200 mg by mouth daily. , Disp: , Rfl:  .  aspirin EC 81 MG tablet, Take 81 mg by mouth daily., Disp: , Rfl:  .  carvedilol (COREG) 12.5 MG tablet, Take 6.25 mg by mouth 2 (two) times daily with a meal., Disp: , Rfl:  .  Cholecalciferol (VITAMIN D) 2000 units tablet, Take 2,000 Units by mouth 2 (two) times daily., Disp: , Rfl:  .  empagliflozin (JARDIANCE) 25 MG TABS tablet, Take 25 mg by mouth daily., Disp: , Rfl:  .  ezetimibe (ZETIA) 10 MG tablet, Take 1 tablet (10 mg total) by  mouth daily. (Patient taking differently: Take 10 mg by mouth at bedtime. ), Disp: 90 tablet, Rfl: 3 .  insulin aspart protamine - aspart (NOVOLOG MIX 70/30 FLEXPEN) (70-30) 100 UNIT/ML FlexPen, Inject 63 Units into the skin 2 (two) times daily as needed (as needed for blood sugar over 200). as needed for blood sugar over 200, Disp: , Rfl:  .  isosorbide mononitrate (IMDUR) 30 MG 24 hr tablet, Take 30 mg by mouth daily., Disp: , Rfl:  .  liraglutide (VICTOZA) 18 MG/3ML SOPN, Inject 1.8 mg into the skin daily., Disp: , Rfl:  .  metFORMIN (GLUCOPHAGE-XR) 500 MG 24 hr tablet, Take 500 mg by mouth 2 (two) times daily. , Disp: , Rfl:  .  metolazone (ZAROXOLYN) 2.5 MG tablet, Take 2.5 mg by mouth daily as needed (for >2 lb weight gain)., Disp: , Rfl:  .  Omega-3 Fatty Acids (FISH OIL) 1000 MG CAPS, Take 1,000 mg by mouth 2 (two) times daily. , Disp: , Rfl:  .  potassium chloride SA (K-DUR,KLOR-CON) 20 MEQ tablet, Take 1 tablet (20 mEq total) by mouth 2 (two) times daily., Disp: 60 tablet, Rfl: 3 .  PRESCRIPTION MEDICATION, Inhale into the lungs at bedtime. CPAP, Disp: , Rfl:  .  rosuvastatin (CRESTOR) 40 MG tablet, Take 40 mg by mouth at bedtime., Disp: , Rfl:  .  sacubitril-valsartan (ENTRESTO) 97-103 MG, Take 1 tablet by mouth 2 (two) times daily. , Disp: , Rfl:  .  sertraline (ZOLOFT) 100 MG tablet, Take 50 mg by mouth at bedtime. , Disp: , Rfl:  .  ticagrelor (BRILINTA) 90 MG TABS tablet, Take 1 tablet (90 mg total) by mouth 2 (two) times daily., Disp: 60 tablet, Rfl: 3 .  torsemide (DEMADEX) 20 MG tablet, Take 1 tablet (20 mg total) by mouth 2 (two) times daily. (Patient taking differently: Take 20 mg by mouth 2 (two) times daily as needed (fluid). ), Disp: 180 tablet, Rfl: 3  EXAM: This was a telehealth telephone visit and thus no physical exam was completed  ASSESSMENT AND PLAN:  Discussed the following assessment and plan:  CHF (congestive heart failure) ([place]) Doing well.  He will continue  his current regimen and follow-up with cardiology.  OSA on CPAP He will continue his CPAP through the [place].  Uncontrolled type 2 diabetes with stage 3 chronic kidney disease GFR 30-59 (HCC) Remains uncontrolled.  Discussed increasing his Jardiance back to 25 mg daily.  We will refer her to endocrinology locally for second opinion regarding his regimen.    I discussed the assessment and treatment plan with the patient. The patient was provided an opportunity to ask questions and all  were answered. The patient agreed with the plan and demonstrated an understanding of the instructions.   The patient was advised to call back or seek an in-person evaluation if the symptoms worsen or if the condition fails to improve as anticipated.  I provided 12 minutes of non-face-to-face time during this encounter.   Tommi Rumps, MD

## 2019-01-01 NOTE — Assessment & Plan Note (Signed)
Remains uncontrolled.  Discussed increasing his Jardiance back to 25 mg daily.  We will refer her to endocrinology locally for second opinion regarding his regimen.

## 2019-01-03 ENCOUNTER — Other Ambulatory Visit: Payer: Self-pay

## 2019-01-03 ENCOUNTER — Emergency Department: Payer: No Typology Code available for payment source

## 2019-01-03 ENCOUNTER — Encounter: Payer: Self-pay | Admitting: Emergency Medicine

## 2019-01-03 ENCOUNTER — Inpatient Hospital Stay
Admission: EM | Admit: 2019-01-03 | Discharge: 2019-01-08 | DRG: 280 | Disposition: A | Discharge status: Other Acute Inpatient Hospital | Payer: No Typology Code available for payment source | Attending: Internal Medicine | Admitting: Internal Medicine

## 2019-01-03 DIAGNOSIS — T82855A Stenosis of coronary artery stent, initial encounter: Secondary | ICD-10-CM | POA: Diagnosis not present

## 2019-01-03 DIAGNOSIS — Y832 Surgical operation with anastomosis, bypass or graft as the cause of abnormal reaction of the patient, or of later complication, without mention of misadventure at the time of the procedure: Secondary | ICD-10-CM | POA: Diagnosis present

## 2019-01-03 DIAGNOSIS — E1169 Type 2 diabetes mellitus with other specified complication: Secondary | ICD-10-CM

## 2019-01-03 DIAGNOSIS — I214 Non-ST elevation (NSTEMI) myocardial infarction: Secondary | ICD-10-CM

## 2019-01-03 DIAGNOSIS — Z951 Presence of aortocoronary bypass graft: Secondary | ICD-10-CM | POA: Diagnosis not present

## 2019-01-03 DIAGNOSIS — Z87891 Personal history of nicotine dependence: Secondary | ICD-10-CM | POA: Diagnosis not present

## 2019-01-03 DIAGNOSIS — Z7982 Long term (current) use of aspirin: Secondary | ICD-10-CM | POA: Diagnosis not present

## 2019-01-03 DIAGNOSIS — N189 Chronic kidney disease, unspecified: Secondary | ICD-10-CM

## 2019-01-03 DIAGNOSIS — E785 Hyperlipidemia, unspecified: Secondary | ICD-10-CM | POA: Diagnosis not present

## 2019-01-03 DIAGNOSIS — J9600 Acute respiratory failure, unspecified whether with hypoxia or hypercapnia: Secondary | ICD-10-CM

## 2019-01-03 DIAGNOSIS — N179 Acute kidney failure, unspecified: Secondary | ICD-10-CM | POA: Diagnosis not present

## 2019-01-03 DIAGNOSIS — Z794 Long term (current) use of insulin: Secondary | ICD-10-CM | POA: Diagnosis not present

## 2019-01-03 DIAGNOSIS — R0603 Acute respiratory distress: Secondary | ICD-10-CM | POA: Diagnosis not present

## 2019-01-03 DIAGNOSIS — J9601 Acute respiratory failure with hypoxia: Secondary | ICD-10-CM | POA: Diagnosis present

## 2019-01-03 DIAGNOSIS — I97190 Other postprocedural cardiac functional disturbances following cardiac surgery: Principal | ICD-10-CM | POA: Diagnosis present

## 2019-01-03 DIAGNOSIS — Z95810 Presence of automatic (implantable) cardiac defibrillator: Secondary | ICD-10-CM | POA: Diagnosis not present

## 2019-01-03 DIAGNOSIS — E1165 Type 2 diabetes mellitus with hyperglycemia: Secondary | ICD-10-CM | POA: Diagnosis present

## 2019-01-03 DIAGNOSIS — Z79899 Other long term (current) drug therapy: Secondary | ICD-10-CM

## 2019-01-03 DIAGNOSIS — I110 Hypertensive heart disease with heart failure: Secondary | ICD-10-CM | POA: Diagnosis not present

## 2019-01-03 DIAGNOSIS — R7989 Other specified abnormal findings of blood chemistry: Secondary | ICD-10-CM | POA: Diagnosis not present

## 2019-01-03 DIAGNOSIS — N183 Chronic kidney disease, stage 3 unspecified: Secondary | ICD-10-CM | POA: Diagnosis not present

## 2019-01-03 DIAGNOSIS — J810 Acute pulmonary edema: Secondary | ICD-10-CM | POA: Diagnosis not present

## 2019-01-03 DIAGNOSIS — I252 Old myocardial infarction: Secondary | ICD-10-CM

## 2019-01-03 DIAGNOSIS — I509 Heart failure, unspecified: Secondary | ICD-10-CM | POA: Diagnosis not present

## 2019-01-03 DIAGNOSIS — E1122 Type 2 diabetes mellitus with diabetic chronic kidney disease: Secondary | ICD-10-CM | POA: Diagnosis not present

## 2019-01-03 DIAGNOSIS — Z9111 Patient's noncompliance with dietary regimen: Secondary | ICD-10-CM

## 2019-01-03 DIAGNOSIS — I255 Ischemic cardiomyopathy: Secondary | ICD-10-CM | POA: Diagnosis not present

## 2019-01-03 DIAGNOSIS — Z20828 Contact with and (suspected) exposure to other viral communicable diseases: Secondary | ICD-10-CM | POA: Diagnosis present

## 2019-01-03 DIAGNOSIS — I959 Hypotension, unspecified: Secondary | ICD-10-CM | POA: Diagnosis present

## 2019-01-03 DIAGNOSIS — I472 Ventricular tachycardia: Secondary | ICD-10-CM | POA: Diagnosis not present

## 2019-01-03 DIAGNOSIS — I447 Left bundle-branch block, unspecified: Secondary | ICD-10-CM | POA: Diagnosis present

## 2019-01-03 DIAGNOSIS — E7800 Pure hypercholesterolemia, unspecified: Secondary | ICD-10-CM | POA: Diagnosis present

## 2019-01-03 DIAGNOSIS — Z955 Presence of coronary angioplasty implant and graft: Secondary | ICD-10-CM | POA: Diagnosis not present

## 2019-01-03 DIAGNOSIS — I21A9 Other myocardial infarction type: Secondary | ICD-10-CM | POA: Diagnosis not present

## 2019-01-03 DIAGNOSIS — I5043 Acute on chronic combined systolic (congestive) and diastolic (congestive) heart failure: Secondary | ICD-10-CM | POA: Diagnosis not present

## 2019-01-03 DIAGNOSIS — I130 Hypertensive heart and chronic kidney disease with heart failure and stage 1 through stage 4 chronic kidney disease, or unspecified chronic kidney disease: Secondary | ICD-10-CM | POA: Diagnosis not present

## 2019-01-03 DIAGNOSIS — E119 Type 2 diabetes mellitus without complications: Secondary | ICD-10-CM

## 2019-01-03 DIAGNOSIS — Z833 Family history of diabetes mellitus: Secondary | ICD-10-CM | POA: Diagnosis not present

## 2019-01-03 DIAGNOSIS — I25810 Atherosclerosis of coronary artery bypass graft(s) without angina pectoris: Secondary | ICD-10-CM | POA: Diagnosis not present

## 2019-01-03 DIAGNOSIS — G4733 Obstructive sleep apnea (adult) (pediatric): Secondary | ICD-10-CM | POA: Diagnosis present

## 2019-01-03 DIAGNOSIS — I2510 Atherosclerotic heart disease of native coronary artery without angina pectoris: Secondary | ICD-10-CM | POA: Diagnosis not present

## 2019-01-03 DIAGNOSIS — R918 Other nonspecific abnormal finding of lung field: Secondary | ICD-10-CM | POA: Diagnosis not present

## 2019-01-03 DIAGNOSIS — I10 Essential (primary) hypertension: Secondary | ICD-10-CM

## 2019-01-03 DIAGNOSIS — E669 Obesity, unspecified: Secondary | ICD-10-CM | POA: Diagnosis present

## 2019-01-03 DIAGNOSIS — I222 Subsequent non-ST elevation (NSTEMI) myocardial infarction: Secondary | ICD-10-CM | POA: Diagnosis not present

## 2019-01-03 LAB — CBC WITH DIFFERENTIAL/PLATELET
Abs Immature Granulocytes: 0.18 10*3/uL — ABNORMAL HIGH (ref 0.00–0.07)
Basophils Absolute: 0.2 10*3/uL — ABNORMAL HIGH (ref 0.0–0.1)
Basophils Relative: 1 %
Eosinophils Absolute: 0.4 10*3/uL (ref 0.0–0.5)
Eosinophils Relative: 3 %
HCT: 47.5 % (ref 39.0–52.0)
Hemoglobin: 15.2 g/dL (ref 13.0–17.0)
Immature Granulocytes: 1 %
Lymphocytes Relative: 30 %
Lymphs Abs: 4.7 10*3/uL — ABNORMAL HIGH (ref 0.7–4.0)
MCH: 30.6 pg (ref 26.0–34.0)
MCHC: 32 g/dL (ref 30.0–36.0)
MCV: 95.8 fL (ref 80.0–100.0)
Monocytes Absolute: 0.8 10*3/uL (ref 0.1–1.0)
Monocytes Relative: 5 %
Neutro Abs: 9.5 10*3/uL — ABNORMAL HIGH (ref 1.7–7.7)
Neutrophils Relative %: 60 %
Platelets: 312 10*3/uL (ref 150–400)
RBC: 4.96 MIL/uL (ref 4.22–5.81)
RDW: 12.8 % (ref 11.5–15.5)
WBC: 15.8 10*3/uL — ABNORMAL HIGH (ref 4.0–10.5)
nRBC: 0 % (ref 0.0–0.2)

## 2019-01-03 LAB — RESPIRATORY PANEL BY RT PCR (FLU A&B, COVID)
Influenza A by PCR: NEGATIVE
Influenza B by PCR: NEGATIVE
SARS Coronavirus 2 by RT PCR: NEGATIVE

## 2019-01-03 LAB — COMPREHENSIVE METABOLIC PANEL
ALT: 15 U/L (ref 0–44)
AST: 21 U/L (ref 15–41)
Albumin: 4.3 g/dL (ref 3.5–5.0)
Alkaline Phosphatase: 94 U/L (ref 38–126)
Anion gap: 13 (ref 5–15)
BUN: 20 mg/dL (ref 8–23)
CO2: 20 mmol/L — ABNORMAL LOW (ref 22–32)
Calcium: 9.1 mg/dL (ref 8.9–10.3)
Chloride: 101 mmol/L (ref 98–111)
Creatinine, Ser: 1.54 mg/dL — ABNORMAL HIGH (ref 0.61–1.24)
GFR calc Af Amer: 55 mL/min — ABNORMAL LOW (ref >60)
GFR calc non Af Amer: 47 mL/min — ABNORMAL LOW (ref >60)
Glucose, Bld: 573 mg/dL (ref 70–99)
Potassium: 3.9 mmol/L (ref 3.5–5.1)
Sodium: 134 mmol/L — ABNORMAL LOW (ref 135–145)
Total Bilirubin: 0.6 mg/dL (ref 0.3–1.2)
Total Protein: 8 g/dL (ref 6.5–8.1)

## 2019-01-03 LAB — FIBRIN DERIVATIVES D-DIMER (ARMC ONLY): Fibrin derivatives D-dimer (ARMC): 1134.65 ng/mL (FEU) — ABNORMAL HIGH (ref 0.00–499.00)

## 2019-01-03 LAB — BLOOD GAS, VENOUS
Acid-base deficit: 5.5 mmol/L — ABNORMAL HIGH (ref 0.0–2.0)
Bicarbonate: 22.9 mmol/L (ref 20.0–28.0)
Delivery systems: POSITIVE
FIO2: 60
O2 Saturation: 92.7 %
Patient temperature: 37
pCO2, Ven: 56 mmHg (ref 44.0–60.0)
pH, Ven: 7.22 — ABNORMAL LOW (ref 7.250–7.430)
pO2, Ven: 79 mmHg — ABNORMAL HIGH (ref 32.0–45.0)

## 2019-01-03 LAB — PROTIME-INR
INR: 1 (ref 0.8–1.2)
Prothrombin Time: 13.1 seconds (ref 11.4–15.2)

## 2019-01-03 LAB — TROPONIN I (HIGH SENSITIVITY): Troponin I (High Sensitivity): 39 ng/L — ABNORMAL HIGH (ref <18)

## 2019-01-03 LAB — BRAIN NATRIURETIC PEPTIDE: B Natriuretic Peptide: 1262 pg/mL — ABNORMAL HIGH (ref 0.0–100.0)

## 2019-01-03 MED ORDER — IOHEXOL 350 MG/ML SOLN
100.0000 mL | Freq: Once | INTRAVENOUS | Status: AC | PRN
  Administered 2019-01-03: 100 mL via INTRAVENOUS

## 2019-01-03 MED ORDER — VANCOMYCIN HCL IN DEXTROSE 1-5 GM/200ML-% IV SOLN
1000.0000 mg | Freq: Once | INTRAVENOUS | Status: AC
  Administered 2019-01-03: 1000 mg via INTRAVENOUS

## 2019-01-03 MED ORDER — FUROSEMIDE 10 MG/ML IJ SOLN
60.0000 mg | Freq: Once | INTRAMUSCULAR | Status: AC
  Administered 2019-01-03: 60 mg via INTRAVENOUS
  Filled 2019-01-03: qty 8

## 2019-01-03 MED ORDER — VANCOMYCIN HCL IN DEXTROSE 1-5 GM/200ML-% IV SOLN
1000.0000 mg | Freq: Once | INTRAVENOUS | Status: DC
  Filled 2019-01-03: qty 200

## 2019-01-03 MED ORDER — SODIUM CHLORIDE 0.9 % IV SOLN
2.0000 g | Freq: Once | INTRAVENOUS | Status: AC
  Administered 2019-01-03: 2 g via INTRAVENOUS
  Filled 2019-01-03: qty 2

## 2019-01-03 NOTE — ED Triage Notes (Addendum)
Pt presents to ED from home via [facility] with c/o respiratory distress. Pt O2 sats 70-80% upon EMS arrival to pt home. Pt able to follow basic commands and oriented to name only. diaphoretic and cool to touch. Pt decreased level of consciousness with increased work of breathing noted upon arrival to ED.

## 2019-01-03 NOTE — ED Notes (Signed)
Pt resting on stretcher with wife at the bedside. Pt reports he is feeling much better. Work of breathing has improved and pt is able to answer questions and follow commands without issue.

## 2019-01-03 NOTE — ED Notes (Signed)
MD at the bedside for pt evaluation  

## 2019-01-03 NOTE — Progress Notes (Signed)
PHARMACY -  BRIEF ANTIBIOTIC NOTE   Pharmacy has received consult(s) for Vancomycin, Cefepime  from an ED provider.  The patient's profile has been reviewed for ht/wt/allergies/indication/available labs.    One time order(s) placed for Vancomycin 2 gm IV X 1 and Cefepime 2 gm IV X 1 .   Further antibiotics/pharmacy consults should be ordered by admitting physician if indicated.                       Thank you, Miran Kautzman D 01/03/2019  10:12 PM\

## 2019-01-03 NOTE — ED Notes (Signed)
Date and time results received: 01/03/19 10:25 PM  Test: Glucose Critical Value: 573  Name of Provider Notified: Dr. Quentin Cornwall

## 2019-01-03 NOTE — ED Provider Notes (Signed)
[facility] Emergency Department Provider Note    First MD Initiated Contact with Patient 01/03/19 2131     (approximate)  I have reviewed the triage vital signs and the nursing notes.   HISTORY  Chief Complaint Respiratory Distress  Level V Caveat  HPI Martin Bush is a 63 y.o. male extensive cardiac history presents the ER in respiratory distress.  Fully found hypoxic to the low 70s.  Placed on CPAP but unable to get his O2 sat much higher than the mid 70s.  Was not complaining of any chest pain.  Was found to be cool and diaphoretic.  Becoming increasingly drowsy in route.    Past Medical History:  Diagnosis Date  . AICD (automatic cardioverter/defibrillator) present    Medtronic  . Arthritis    "thumbs"  (08/02/2017)  . CHF (congestive heart failure) ([place])   . Chronic combined systolic and diastolic heart failure ([place]) 09/27/2018  . Colon polyps   . Coronary artery disease   . Depression   . Encounter for assessment of implantable cardioverter-defibrillator (ICD) 09/27/2018  . High cholesterol   . Hypertension   . ICD; Biventricular  Medtronic ICD Amplia MRI QWuad CRT-D  in situ 10/29/14 10/29/2014   Remote ICD check 09.23.20:  One 6 beat NSVT. No therapy.  1 SVT episode @ 130 bpm (38 Sec).  There were 23 Vent sense episodes detected for up to 1.1 min/day (AT). Health trends (patient activity, heart rate variability, average heart rates) are stable.Trans-thoracic impedance trends and the OptiVol Fluid Index do no present significant abnormalities. Battery longevity is 4.3 years. RA pacing is 3  . Ischemic cardiomyopathy 09/27/2018  . MI (myocardial infarction) ([place]) 2003  . NSTEMI (non-ST elevated myocardial infarction) ([place]) 07/16/2014  . OSA on CPAP   . Oxygen deficiency   . Pneumonia 10/2014  . Proteinuria   . Sleep apnea   . 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 Uterine cancer  Mother   . Lung cancer Mother   . Hyperlipidemia Father   . Heart disease Father   . Hypertension Father   . Diabetes Father   . Colon cancer Neg Hx   . Esophageal cancer Neg Hx   . Colon polyps Neg Hx   . Rectal cancer Neg Hx   . Stomach cancer Neg Hx   . Inflammatory bowel disease Neg Hx   . Liver disease Neg Hx   . Pancreatic cancer Neg Hx    Past Surgical History:  Procedure Laterality Date  . BIOPSY  09/19/2018   Procedure: BIOPSY;  Surgeon: Thornton Park, MD;  [facility];  Service: Gastroenterology;;  . CARDIAC CATHETERIZATION N/A 07/16/2014   Procedure: Left Heart Cath and Coronary Angiography;  Surgeon: Adrian Prows, MD;  Location: [place] [facility];  Service: Cardiovascular;  Laterality: N/A;  . CARDIAC CATHETERIZATION  07/16/2014   Procedure: Coronary Balloon Angioplasty;  Surgeon: Adrian Prows, MD;  Location: [place] [facility];  Service: Cardiovascular;;  . CARDIAC CATHETERIZATION  2003  . CARDIAC DEFIBRILLATOR PLACEMENT  2016  . CATARACT EXTRACTION W/ INTRAOCULAR LENS IMPLANT Left 03/2014  . COLONOSCOPY    . COLONOSCOPY WITH PROPOFOL N/A 09/19/2018   Procedure: COLONOSCOPY WITH PROPOFOL;  Surgeon: Thornton Park, MD;  [facility];  Service: Gastroenterology;  Laterality: N/A;  . CORONARY ANGIOPLASTY WITH STENT PLACEMENT     "I've got a total of 8 stents  in there; mostly doine at [facility]" (08/02/2017)  . CORONARY ARTERY BYPASS GRAFT  ~ 2003   "CABG X2"; [facility]  . CORONARY ATHERECTOMY N/A 08/16/2017   Procedure: CORONARY ATHERECTOMY;  Surgeon: Nigel Mormon, MD;  Location: [place] [facility];  Service: Cardiovascular;  Laterality: N/A;  . CORONARY BALLOON ANGIOPLASTY N/A 08/04/2017   Procedure: CORONARY BALLOON ANGIOPLASTY;  Surgeon: Adrian Prows, MD;  Location: [place] [facility];  Service: Cardiovascular;  Laterality: N/A;  . CORONARY STENT INTERVENTION N/A 08/16/2017   Procedure: CORONARY STENT INTERVENTION;  Surgeon: Nigel Mormon, MD;  Location: [place] [facility];  Service: Cardiovascular;  Laterality: N/A;  . ELBOW SURGERY Left ?2001   "pinched nerve"  . ENDOSCOPIC MUCOSAL RESECTION  12/18/2018   Procedure: ENDOSCOPIC MUCOSAL RESECTION;  Surgeon: Rush Landmark Telford Nab., MD;  Location: [place];  Service: Gastroenterology;;  . Otho Darner SIGMOIDOSCOPY N/A 12/18/2018   Procedure: Beryle Quant;  Surgeon: Irving Copas., MD;  Location: [place];  Service: Gastroenterology;  Laterality: N/A;  . HEMOSTASIS CLIP PLACEMENT  12/18/2018   Procedure: HEMOSTASIS CLIP PLACEMENT;  Surgeon: Irving Copas., MD;  Location: [place];  Service: Gastroenterology;;  . LEFT HEART CATH AND CORS/GRAFTS ANGIOGRAPHY N/A 08/04/2017   Procedure: LEFT HEART CATH AND CORS/GRAFTS ANGIOGRAPHY;  Surgeon: Adrian Prows, MD;  Location: [place] [facility];  Service: Cardiovascular;  Laterality: N/A;  . LEFT HEART CATH AND CORS/GRAFTS ANGIOGRAPHY N/A 08/20/2017   Procedure: LEFT HEART CATH AND CORS/GRAFTS ANGIOGRAPHY;  Surgeon: Nigel Mormon, MD;  Location: [place] [facility];  Service: Cardiovascular;  Laterality: N/A;  . POLYPECTOMY  09/19/2018   Procedure: POLYPECTOMY;  Surgeon: Thornton Park, MD;  [facility];  Service: Gastroenterology;;  . Lia Foyer INJECTION  09/19/2018   Procedure: SUBMUCOSAL INJECTION;  Surgeon: Thornton Park, MD;  [facility];  Service: Gastroenterology;;  . Lia Foyer LIFTING INJECTION  12/18/2018   Procedure: SUBMUCOSAL LIFTING INJECTION;  Surgeon: Irving Copas., MD;  Location: [place];  Service: Gastroenterology;;   Patient Active Problem List   Diagnosis Date Noted  . 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 History of colonic polyps 11/12/2018  . Abnormal colonoscopy 11/12/2018  . Ischemic cardiomyopathy 09/27/2018  . Encounter for assessment of implantable cardioverter-defibrillator (ICD) 09/27/2018  . Enrolled in  clinical trial of drug 06/13/2018  . Weight loss 09/19/2017  . Ventricular tachycardia ([place])   . ICD (implantable cardioverter-defibrillator) discharge 08/02/2017  . Obesity 06/17/2017  . CHF (congestive heart failure) ([place]) 07/23/2015  . Hyperlipidemia 07/11/2015  . Essential hypertension 07/11/2015  . Coronary artery disease involving autologous artery coronary bypass graft without angina pectoris 07/11/2015  . Depression 07/11/2015  . CKD (chronic kidney disease), stage III ([place]) 07/11/2015  . OSA on CPAP 07/11/2015  . Lightheadedness 07/11/2015  . Carotid artery stenosis   . S/P eye surgery 05/15/2015  . Injury of left toe 04/27/2015  . Chronic low back pain 02/13/2015  . Uncontrolled type 2 diabetes with stage 3 chronic kidney disease GFR 30-59 ([place]) 02/13/2015  . Dizziness 02/05/2015  . ICD; Biventricular  Medtronic ICD Amplia MRI QWuad CRT-D  in situ 10/29/14 10/29/2014      Prior to Admission medications   Medication Sig Start Date End Date Taking? Authorizing Provider  amiodarone (PACERONE) 200 MG tablet Take 200 mg by mouth daily.  11/19/14  Yes [provider]  aspirin EC 81 MG tablet Take 81 mg by mouth daily.   Yes [provider]  carvedilol (  COREG) 12.5 MG tablet Take 6.25 mg by mouth 2 (two) times daily with a meal.   Yes [provider]  Cholecalciferol (VITAMIN D) 2000 units tablet Take 2,000 Units by mouth 2 (two) times daily.   Yes [provider]  empagliflozin (JARDIANCE) 25 MG TABS tablet Take 25 mg by mouth daily.   Yes [provider]  ezetimibe (ZETIA) 10 MG tablet Take 1 tablet (10 mg total) by mouth daily. Patient taking differently: Take 10 mg by mouth at bedtime.  10/26/18  Yes [name], MD  insulin aspart protamine - aspart (NOVOLOG MIX 70/30 FLEXPEN) (70-30) 100 UNIT/ML FlexPen Inject 63 Units into the skin 2 (two) times daily as needed (as needed for blood sugar over 200). as needed for blood sugar  over 200   Yes [provider]  isosorbide mononitrate (IMDUR) 30 MG 24 hr tablet Take 30 mg by mouth daily.   Yes [provider]  liraglutide (VICTOZA) 18 MG/3ML SOPN Inject 1.8 mg into the skin daily.   Yes [provider]  metFORMIN (GLUCOPHAGE-XR) 500 MG 24 hr tablet Take 500 mg by mouth 2 (two) times daily.    Yes [provider]  metolazone (ZAROXOLYN) 2.5 MG tablet Take 2.5 mg by mouth daily as needed (for >2 lb weight gain).   Yes [provider]  Omega-3 Fatty Acids (FISH OIL) 1000 MG CAPS Take 1,000 mg by mouth 2 (two) times daily.    Yes [provider]  potassium chloride SA (K-DUR,KLOR-CON) 20 MEQ tablet Take 1 tablet (20 mEq total) by mouth 2 (two) times daily. 01/07/18  Yes Erskine Squibb, MD  PRESCRIPTION MEDICATION Inhale into the lungs at bedtime. CPAP   Yes [provider]  rosuvastatin (CRESTOR) 40 MG tablet Take 40 mg by mouth at bedtime.   Yes [provider]  sacubitril-valsartan (ENTRESTO) 97-103 MG Take 1 tablet by mouth 2 (two) times daily.    Yes [provider]  sertraline (ZOLOFT) 100 MG tablet Take 50 mg by mouth at bedtime.    Yes [provider]  ticagrelor (BRILINTA) 90 MG TABS tablet Take 1 tablet (90 mg total) by mouth 2 (two) times daily. 08/05/17  Yes Adrian Prows, MD  torsemide (DEMADEX) 20 MG tablet Take 1 tablet (20 mg total) by mouth 2 (two) times daily. Patient taking differently: Take 20 mg by mouth 2 (two) times daily as needed (fluid).  10/19/18  Yes Miquel Dunn, NP    Allergies Diltiazem    Social History Social History   Tobacco Use  . Smoking status: Former Smoker    Years: 27.00    Types: Cigars    Quit date: 2002    Years since quitting: 18.9  . Smokeless tobacco: Former Systems developer    Quit date: 2002  Substance Use Topics  . Alcohol use: Not Currently  . Drug use: Never    Review of Systems Patient denies headaches, rhinorrhea, blurry  vision, numbness, shortness of breath, chest pain, edema, cough, abdominal pain, nausea, vomiting, diarrhea, dysuria, fevers, rashes or hallucinations unless otherwise stated above in HPI. ____________________________________________   PHYSICAL EXAM:  VITAL SIGNS: Vitals:   01/03/19 2123 01/03/19 2200  BP: (!) 156/98 (!) 144/90  Pulse: 97 84  Resp: (!) 21 20  SpO2: 98% 100%    Constitutional: acute respiratory distress, diaphoretic Eyes: Conjunctivae are normal.  Head: Atraumatic. Nose: No congestion/rhinnorhea. Mouth/Throat: Mucous membranes are moist.   Neck: No stridor. Painless ROM.  Cardiovascular: Normal rate, regular rhythm. Grossly normal heart sounds.   Respiratory: tachypnea with diminished posterior breath sounds, requiring BIPAP for respiratory support Gastrointestinal: Soft and nontender. No distention. No abdominal bruits. No CVA tenderness. Genitourinary:  Musculoskeletal: No lower extremity tenderness nor edema.  No joint effusions. Neurologic:  .encephalopathic, unable to assess 2/2 resp distress Skin:  Skin is warm, dry and intact. No rash noted. Psychiatric: un able to assess 2/2 resp distress  ____________________________________________   LABS (all labs ordered are listed, but only abnormal results are displayed)  Results for orders placed or performed during the hospital encounter of 01/03/19 (from the past 24 hour(s))  CBC with Differential     Status: Abnormal   Collection Time: 01/03/19  9:32 PM  Result Value Ref Range   WBC 15.8 (H) 4.0 - 10.5 K/uL   RBC 4.96 4.22 - 5.81 MIL/uL   Hemoglobin 15.2 13.0 - 17.0 g/dL   HCT 47.5 39.0 - 52.0 %   MCV 95.8 80.0 - 100.0 fL   MCH 30.6 26.0 - 34.0 pg   MCHC 32.0 30.0 - 36.0 g/dL   RDW 12.8 11.5 - 15.5 %   Platelets 312 150 - 400 K/uL   nRBC 0.0 0.0 - 0.2 %   Neutrophils Relative % 60 %   Neutro Abs 9.5 (H) 1.7 - 7.7 K/uL   Lymphocytes Relative 30 %   Lymphs Abs 4.7 (H) 0.7 - 4.0 K/uL   Monocytes  Relative 5 %   Monocytes Absolute 0.8 0.1 - 1.0 K/uL   Eosinophils Relative 3 %   Eosinophils Absolute 0.4 0.0 - 0.5 K/uL   Basophils Relative 1 %   Basophils Absolute 0.2 (H) 0.0 - 0.1 K/uL   Immature Granulocytes 1 %   Abs Immature Granulocytes 0.18 (H) 0.00 - 0.07 K/uL  Comprehensive metabolic panel     Status: Abnormal   Collection Time: 01/03/19  9:32 PM  Result Value Ref Range   Sodium 134 (L) 135 - 145 mmol/L   Potassium 3.9 3.5 - 5.1 mmol/L   Chloride 101 98 - 111 mmol/L   CO2 20 (L) 22 - 32 mmol/L   Glucose, Bld 573 (HH) 70 - 99 mg/dL   BUN 20 8 - 23 mg/dL   Creatinine, Ser 1.54 (H) 0.61 - 1.24 mg/dL   Calcium 9.1 8.9 - 10.3 mg/dL   Total Protein 8.0 6.5 - 8.1 g/dL   Albumin 4.3 3.5 - 5.0 g/dL   AST 21 15 - 41 U/L   ALT 15 0 - 44 U/L   Alkaline Phosphatase 94 38 - 126 U/L   Total Bilirubin 0.6 0.3 - 1.2 mg/dL   GFR calc non Af Amer 47 (L) >60 mL/min   GFR calc Af Amer 55 (L) >60 mL/min   Anion gap 13 5 - 15  Troponin I (High Sensitivity)     Status: Abnormal   Collection Time: 01/03/19  9:32 PM  Result Value Ref Range   Troponin I (High Sensitivity) 39 (H) <18 ng/L  Fibrin derivatives D-Dimer ([facility] only)     Status: Abnormal   Collection Time: 01/03/19  9:32 PM  Result Value Ref Range   Fibrin derivatives D-dimer ([facility]) 1,134.65 (H) 0.00 - 499.00 ng/mL (FEU)  Protime-INR     Status: None   Collection Time: 01/03/19  9:32 PM  Result Value Ref Range   Prothrombin Time 13.1 11.4 - 15.2 seconds   INR 1.0 0.8 - 1.2  Brain natriuretic peptide  Status: Abnormal   Collection Time: 01/03/19  9:33 PM  Result Value Ref Range   B Natriuretic Peptide 1,262.0 (H) 0.0 - 100.0 pg/mL  Respiratory Panel by RT PCR (Flu A&B, Covid) - Nasopharyngeal Swab     Status: None   Collection Time: 01/03/19 10:49 PM   Specimen: Nasopharyngeal Swab  Result Value Ref Range   SARS Coronavirus 2 by RT PCR NEGATIVE NEGATIVE   Influenza A by PCR NEGATIVE NEGATIVE   Influenza B by PCR  NEGATIVE NEGATIVE  Blood gas, venous     Status: Abnormal   Collection Time: 01/03/19 11:15 PM  Result Value Ref Range   FIO2 60.00    Delivery systems BILEVEL POSITIVE AIRWAY PRESSURE    pH, Ven 7.22 (L) 7.250 - 7.430   pCO2, Ven 56 44.0 - 60.0 mmHg   pO2, Ven 79.0 (H) 32.0 - 45.0 mmHg   Bicarbonate 22.9 20.0 - 28.0 mmol/L   Acid-base deficit 5.5 (H) 0.0 - 2.0 mmol/L   O2 Saturation 92.7 %   Patient temperature 37.0    Collection site LINE    Sample type VENOUS    ____________________________________________  EKG My review and personal interpretation at Time: 21:21   Indication: resp distress  Rate: 110  Rhythm: junctional v paced rhythm Axis: left Other: lbbb, concordant ste in avr and avl.   My review and personal interpretation at Time: 44 47  Indication: resp distress  Rate: 110  Rhythm: junctional v paced rhythm Axis: left Other: lbbb, concordant ste in avr and avl. ____________________________________________  RADIOLOGY  I personally reviewed all radiographic images ordered to evaluate for the above acute complaints and reviewed radiology reports and findings.  These findings were personally discussed with the patient.  Please see medical record for radiology report.  ____________________________________________   PROCEDURES  Procedure(s) performed:  .Critical Care Performed by: Merlyn Lot, MD Authorized by: Merlyn Lot, MD   Critical care provider statement:    Critical care time (minutes):  30   Critical care time was exclusive of:  Separately billable procedures and treating other patients   Critical care was time spent personally by me on the following activities:  Development of treatment plan with patient or surrogate, discussions with consultants, evaluation of patient's response to treatment, examination of patient, obtaining history from patient or surrogate, ordering and performing treatments and interventions, ordering and review of  laboratory studies, ordering and review of radiographic studies, pulse oximetry, re-evaluation of patient's condition and review of old charts      Critical Care performed: yes ____________________________________________   INITIAL IMPRESSION / ASSESSMENT AND PLAN / ED COURSE  Pertinent labs & imaging results that were available during my care of the patient were reviewed by me and considered in my medical decision making (see chart for details).   DDX: Asthma, copd, CHF, pna, ptx, malignancy, Pe, anemia   Martin Bush is a 63 y.o. who presents to the ED with cute respiratory failure hypoxic.  Patient transitioned over onto BiPAP to temporize the patient in anticipation for intubation.  While obtaining IV access maximizing BiPAP patient's mental status improved as did his O2 saturations.  Patient following commands.  It would provide additional history denying any chest pain.  States his been compliant with the medication.  Denies any recent fevers.  Clinical Course as of Jan 03 2348  Wed Jan 03, 2019  2153 Patient's mentation is significantly improving now that he is no longer hypoxic.  Repeat EKG now normalized which I  also suspect is now improved secondary to his improved O2 sats.  He is denying any chest pain at this time.  I think his primary respiratory failure in the setting of congestive heart failure.   [PR]  2335 Patient continues improve.  VBG is reassuring.  Patient going for CTA to exclude PE.  Anticipate if is negative might be able to wean him from BiPAP.  Patient will require admission the hospital.  Will be signed out to oncoming physician to f/u on CTA.   [PR]    Clinical Course User Index [PR] Merlyn Lot, MD    The patient was evaluated in Emergency Department today for the symptoms described in the history of present illness. He/she was evaluated in the context of the global COVID-19 pandemic, which necessitated consideration that the patient might be at risk  for infection with the SARS-CoV-2 virus that causes COVID-19. Institutional protocols and algorithms that pertain to the evaluation of patients at risk for COVID-19 are in a state of rapid change based on information released by regulatory bodies including the CDC and federal and state organizations. These policies and algorithms were followed during the patient's care in the ED.  As part of my medical decision making, I reviewed the following data within the [place] notes reviewed and incorporated, Labs reviewed, notes from prior ED visits and [place] Controlled Substance Database   ____________________________________________   FINAL CLINICAL IMPRESSION(S) / ED DIAGNOSES  Final diagnoses:  Acute respiratory failure with hypoxia ([place])  Acute on chronic congestive heart failure, unspecified heart failure type ([place])  Acute pulmonary edema (HCC)      NEW MEDICATIONS STARTED DURING THIS VISIT:  New Prescriptions   No medications on file     Note:  This document was prepared using Dragon voice recognition software and may include unintentional dictation errors.    Merlyn Lot, MD 01/03/19 [phone]

## 2019-01-04 ENCOUNTER — Inpatient Hospital Stay (HOSPITAL_COMMUNITY)
Admit: 2019-01-04 | Discharge: 2019-01-04 | Disposition: A | Discharge status: Home / Self Care | Payer: No Typology Code available for payment source | Attending: Cardiovascular Disease | Admitting: Cardiovascular Disease

## 2019-01-04 ENCOUNTER — Encounter: Payer: Self-pay | Admitting: Internal Medicine

## 2019-01-04 ENCOUNTER — Telehealth: Payer: Self-pay

## 2019-01-04 ENCOUNTER — Other Ambulatory Visit: Payer: Self-pay

## 2019-01-04 DIAGNOSIS — I214 Non-ST elevation (NSTEMI) myocardial infarction: Secondary | ICD-10-CM | POA: Diagnosis not present

## 2019-01-04 DIAGNOSIS — J9601 Acute respiratory failure with hypoxia: Secondary | ICD-10-CM | POA: Diagnosis present

## 2019-01-04 DIAGNOSIS — Y832 Surgical operation with anastomosis, bypass or graft as the cause of abnormal reaction of the patient, or of later complication, without mention of misadventure at the time of the procedure: Secondary | ICD-10-CM | POA: Diagnosis present

## 2019-01-04 DIAGNOSIS — I21A9 Other myocardial infarction type: Secondary | ICD-10-CM | POA: Diagnosis present

## 2019-01-04 DIAGNOSIS — I5043 Acute on chronic combined systolic (congestive) and diastolic (congestive) heart failure: Secondary | ICD-10-CM

## 2019-01-04 DIAGNOSIS — I472 Ventricular tachycardia: Secondary | ICD-10-CM | POA: Diagnosis present

## 2019-01-04 DIAGNOSIS — I130 Hypertensive heart and chronic kidney disease with heart failure and stage 1 through stage 4 chronic kidney disease, or unspecified chronic kidney disease: Secondary | ICD-10-CM | POA: Diagnosis present

## 2019-01-04 DIAGNOSIS — Z833 Family history of diabetes mellitus: Secondary | ICD-10-CM | POA: Diagnosis not present

## 2019-01-04 DIAGNOSIS — T82855A Stenosis of coronary artery stent, initial encounter: Secondary | ICD-10-CM | POA: Diagnosis present

## 2019-01-04 DIAGNOSIS — Z794 Long term (current) use of insulin: Secondary | ICD-10-CM

## 2019-01-04 DIAGNOSIS — Z87891 Personal history of nicotine dependence: Secondary | ICD-10-CM | POA: Diagnosis not present

## 2019-01-04 DIAGNOSIS — Z95810 Presence of automatic (implantable) cardiac defibrillator: Secondary | ICD-10-CM | POA: Diagnosis not present

## 2019-01-04 DIAGNOSIS — J810 Acute pulmonary edema: Secondary | ICD-10-CM | POA: Diagnosis present

## 2019-01-04 DIAGNOSIS — N179 Acute kidney failure, unspecified: Secondary | ICD-10-CM | POA: Diagnosis present

## 2019-01-04 DIAGNOSIS — I255 Ischemic cardiomyopathy: Secondary | ICD-10-CM | POA: Diagnosis present

## 2019-01-04 DIAGNOSIS — Z955 Presence of coronary angioplasty implant and graft: Secondary | ICD-10-CM | POA: Diagnosis not present

## 2019-01-04 DIAGNOSIS — I97190 Other postprocedural cardiac functional disturbances following cardiac surgery: Secondary | ICD-10-CM | POA: Diagnosis present

## 2019-01-04 DIAGNOSIS — Z7982 Long term (current) use of aspirin: Secondary | ICD-10-CM | POA: Diagnosis not present

## 2019-01-04 DIAGNOSIS — J9600 Acute respiratory failure, unspecified whether with hypoxia or hypercapnia: Secondary | ICD-10-CM

## 2019-01-04 DIAGNOSIS — I2510 Atherosclerotic heart disease of native coronary artery without angina pectoris: Secondary | ICD-10-CM | POA: Diagnosis present

## 2019-01-04 DIAGNOSIS — G4733 Obstructive sleep apnea (adult) (pediatric): Secondary | ICD-10-CM | POA: Diagnosis present

## 2019-01-04 DIAGNOSIS — E119 Type 2 diabetes mellitus without complications: Secondary | ICD-10-CM

## 2019-01-04 DIAGNOSIS — I252 Old myocardial infarction: Secondary | ICD-10-CM | POA: Diagnosis not present

## 2019-01-04 DIAGNOSIS — Z20828 Contact with and (suspected) exposure to other viral communicable diseases: Secondary | ICD-10-CM | POA: Diagnosis present

## 2019-01-04 DIAGNOSIS — N183 Chronic kidney disease, stage 3 unspecified: Secondary | ICD-10-CM

## 2019-01-04 DIAGNOSIS — E785 Hyperlipidemia, unspecified: Secondary | ICD-10-CM | POA: Diagnosis present

## 2019-01-04 DIAGNOSIS — Z79899 Other long term (current) drug therapy: Secondary | ICD-10-CM | POA: Diagnosis not present

## 2019-01-04 DIAGNOSIS — Z951 Presence of aortocoronary bypass graft: Secondary | ICD-10-CM

## 2019-01-04 DIAGNOSIS — E1122 Type 2 diabetes mellitus with diabetic chronic kidney disease: Secondary | ICD-10-CM | POA: Diagnosis present

## 2019-01-04 DIAGNOSIS — E7800 Pure hypercholesterolemia, unspecified: Secondary | ICD-10-CM | POA: Diagnosis present

## 2019-01-04 LAB — CBC
HCT: 40 % (ref 39.0–52.0)
Hemoglobin: 13.3 g/dL (ref 13.0–17.0)
MCH: 30.4 pg (ref 26.0–34.0)
MCHC: 33.3 g/dL (ref 30.0–36.0)
MCV: 91.3 fL (ref 80.0–100.0)
Platelets: 185 10*3/uL (ref 150–400)
RBC: 4.38 MIL/uL (ref 4.22–5.81)
RDW: 12.8 % (ref 11.5–15.5)
WBC: 9.3 10*3/uL (ref 4.0–10.5)
nRBC: 0 % (ref 0.0–0.2)

## 2019-01-04 LAB — HIV ANTIBODY (ROUTINE TESTING W REFLEX): HIV Screen 4th Generation wRfx: NONREACTIVE

## 2019-01-04 LAB — BASIC METABOLIC PANEL
Anion gap: 10 (ref 5–15)
BUN: 23 mg/dL (ref 8–23)
CO2: 25 mmol/L (ref 22–32)
Calcium: 8.9 mg/dL (ref 8.9–10.3)
Chloride: 101 mmol/L (ref 98–111)
Creatinine, Ser: 1.41 mg/dL — ABNORMAL HIGH (ref 0.61–1.24)
GFR calc Af Amer: >60 mL/min (ref >60)
GFR calc non Af Amer: 53 mL/min — ABNORMAL LOW (ref >60)
Glucose, Bld: 370 mg/dL — ABNORMAL HIGH (ref 70–99)
Potassium: 4.4 mmol/L (ref 3.5–5.1)
Sodium: 136 mmol/L (ref 135–145)

## 2019-01-04 LAB — GLUCOSE, CAPILLARY
Glucose-Capillary: 254 mg/dL — ABNORMAL HIGH (ref 70–99)
Glucose-Capillary: 212 mg/dL — ABNORMAL HIGH (ref 70–99)
Glucose-Capillary: 118 mg/dL — ABNORMAL HIGH (ref 70–99)
Glucose-Capillary: 177 mg/dL — ABNORMAL HIGH (ref 70–99)
Glucose-Capillary: 142 mg/dL — ABNORMAL HIGH (ref 70–99)

## 2019-01-04 LAB — HEPARIN LEVEL (UNFRACTIONATED)
Heparin Unfractionated: 0.24 IU/mL — ABNORMAL LOW (ref 0.30–0.70)
Heparin Unfractionated: 0.41 IU/mL (ref 0.30–0.70)

## 2019-01-04 LAB — LIPID PANEL
Cholesterol: 142 mg/dL (ref 0–200)
HDL: 45 mg/dL (ref >40)
LDL Cholesterol: 67 mg/dL (ref 0–99)
Total CHOL/HDL Ratio: 3.2 RATIO
Triglycerides: 148 mg/dL (ref <150)
VLDL: 30 mg/dL (ref 0–40)

## 2019-01-04 LAB — MRSA PCR SCREENING: MRSA by PCR: NEGATIVE

## 2019-01-04 LAB — TROPONIN I (HIGH SENSITIVITY)
Troponin I (High Sensitivity): 2747 ng/L (ref <18)
Troponin I (High Sensitivity): 11045 ng/L (ref <18)
Troponin I (High Sensitivity): 9144 ng/L (ref <18)

## 2019-01-04 LAB — ECHOCARDIOGRAM COMPLETE
Height: 67 in
Weight: 3456 oz

## 2019-01-04 LAB — PROCALCITONIN: Procalcitonin: <0.1 ng/mL

## 2019-01-04 MED ORDER — INSULIN ASPART 100 UNIT/ML ~~LOC~~ SOLN
6.0000 [IU] | Freq: Three times a day (TID) | SUBCUTANEOUS | Status: DC
  Administered 2019-01-04 (×3): 6 [IU] via SUBCUTANEOUS
  Filled 2019-01-04 (×3): qty 1

## 2019-01-04 MED ORDER — CHLORHEXIDINE GLUCONATE CLOTH 2 % EX PADS
6.0000 | MEDICATED_PAD | Freq: Every day | CUTANEOUS | Status: DC
  Administered 2019-01-04 – 2019-01-06 (×2): 6 via TOPICAL

## 2019-01-04 MED ORDER — FUROSEMIDE 10 MG/ML IJ SOLN
80.0000 mg | Freq: Two times a day (BID) | INTRAMUSCULAR | Status: DC
  Administered 2019-01-04 (×2): 80 mg via INTRAVENOUS
  Filled 2019-01-04 (×3): qty 8

## 2019-01-04 MED ORDER — POTASSIUM CHLORIDE CRYS ER 20 MEQ PO TBCR
20.0000 meq | EXTENDED_RELEASE_TABLET | Freq: Two times a day (BID) | ORAL | Status: DC
  Administered 2019-01-04 – 2019-01-07 (×8): 20 meq via ORAL
  Filled 2019-01-04 (×8): qty 1

## 2019-01-04 MED ORDER — ASPIRIN EC 81 MG PO TBEC
81.0000 mg | DELAYED_RELEASE_TABLET | Freq: Every day | ORAL | Status: DC
  Administered 2019-01-04 – 2019-01-07 (×4): 81 mg via ORAL
  Filled 2019-01-04 (×4): qty 1

## 2019-01-04 MED ORDER — HEPARIN BOLUS VIA INFUSION
4000.0000 [IU] | Freq: Once | INTRAVENOUS | Status: AC
  Administered 2019-01-04: 4000 [IU] via INTRAVENOUS
  Filled 2019-01-04: qty 4000

## 2019-01-04 MED ORDER — AMIODARONE HCL 200 MG PO TABS
200.0000 mg | ORAL_TABLET | Freq: Every day | ORAL | Status: DC
  Administered 2019-01-04: 200 mg via ORAL
  Filled 2019-01-04: qty 1

## 2019-01-04 MED ORDER — HEPARIN (PORCINE) 25000 UT/250ML-% IV SOLN
1500.0000 [IU]/h | INTRAVENOUS | Status: DC
  Administered 2019-01-04: 1150 [IU]/h via INTRAVENOUS
  Administered 2019-01-05 (×2): 1300 [IU]/h via INTRAVENOUS
  Administered 2019-01-06 – 2019-01-08 (×3): 1500 [IU]/h via INTRAVENOUS
  Filled 2019-01-04 (×5): qty 250

## 2019-01-04 MED ORDER — ACETAMINOPHEN 325 MG PO TABS: 650.0000 mg | ORAL_TABLET | ORAL | Status: DC | PRN

## 2019-01-04 MED ORDER — NITROGLYCERIN 2 % TD OINT
1.0000 [in_us] | TOPICAL_OINTMENT | Freq: Four times a day (QID) | TRANSDERMAL | Status: DC
  Administered 2019-01-06 – 2019-01-08 (×6): 1 [in_us] via TOPICAL
  Filled 2019-01-04 (×9): qty 1

## 2019-01-04 MED ORDER — ISOSORBIDE MONONITRATE ER 30 MG PO TB24
30.0000 mg | ORAL_TABLET | Freq: Every day | ORAL | Status: DC
  Administered 2019-01-04 – 2019-01-07 (×4): 30 mg via ORAL
  Filled 2019-01-04 (×5): qty 1

## 2019-01-04 MED ORDER — SODIUM CHLORIDE 0.9 % IV SOLN: 250.0000 mL | INTRAVENOUS | Status: DC | PRN

## 2019-01-04 MED ORDER — INSULIN ASPART 100 UNIT/ML ~~LOC~~ SOLN
0.0000 [IU] | Freq: Every day | SUBCUTANEOUS | Status: DC
  Administered 2019-01-05: 2 [IU] via SUBCUTANEOUS
  Administered 2019-01-06: 3 [IU] via SUBCUTANEOUS
  Administered 2019-01-07: 2 [IU] via SUBCUTANEOUS
  Filled 2019-01-04 (×3): qty 1

## 2019-01-04 MED ORDER — PERFLUTREN LIPID MICROSPHERE
1.0000 mL | INTRAVENOUS | Status: AC | PRN
  Administered 2019-01-04: 2 mL via INTRAVENOUS
  Filled 2019-01-04: qty 10

## 2019-01-04 MED ORDER — VITAMIN D 25 MCG (1000 UNIT) PO TABS
2000.0000 [IU] | ORAL_TABLET | Freq: Two times a day (BID) | ORAL | Status: DC
  Administered 2019-01-04 – 2019-01-07 (×8): 2000 [IU] via ORAL
  Filled 2019-01-04 (×8): qty 2

## 2019-01-04 MED ORDER — SERTRALINE HCL 50 MG PO TABS
50.0000 mg | ORAL_TABLET | Freq: Every day | ORAL | Status: DC
  Administered 2019-01-04 – 2019-01-07 (×4): 50 mg via ORAL
  Filled 2019-01-04 (×4): qty 1

## 2019-01-04 MED ORDER — ROSUVASTATIN CALCIUM 10 MG PO TABS
40.0000 mg | ORAL_TABLET | Freq: Every day | ORAL | Status: DC
  Administered 2019-01-04 – 2019-01-07 (×4): 40 mg via ORAL
  Filled 2019-01-04: qty 8
  Filled 2019-01-04: qty 2
  Filled 2019-01-04 (×3): qty 4

## 2019-01-04 MED ORDER — HEPARIN BOLUS VIA INFUSION
1300.0000 [IU] | Freq: Once | INTRAVENOUS | Status: AC
  Administered 2019-01-04: 1300 [IU] via INTRAVENOUS
  Filled 2019-01-04: qty 1300

## 2019-01-04 MED ORDER — SODIUM CHLORIDE 0.9% FLUSH
3.0000 mL | Freq: Two times a day (BID) | INTRAVENOUS | Status: DC
  Administered 2019-01-04 – 2019-01-07 (×6): 3 mL via INTRAVENOUS

## 2019-01-04 MED ORDER — NITROGLYCERIN 0.4 MG SL SUBL: 0.4000 mg | SUBLINGUAL_TABLET | SUBLINGUAL | Status: DC | PRN

## 2019-01-04 MED ORDER — TICAGRELOR 90 MG PO TABS
90.0000 mg | ORAL_TABLET | Freq: Two times a day (BID) | ORAL | Status: DC
  Administered 2019-01-04 – 2019-01-07 (×8): 90 mg via ORAL
  Filled 2019-01-04 (×8): qty 1

## 2019-01-04 MED ORDER — SACUBITRIL-VALSARTAN 97-103 MG PO TABS
1.0000 | ORAL_TABLET | Freq: Two times a day (BID) | ORAL | Status: DC
  Administered 2019-01-04 (×2): 1 via ORAL
  Filled 2019-01-04 (×5): qty 1

## 2019-01-04 MED ORDER — CARVEDILOL 6.25 MG PO TABS
6.2500 mg | ORAL_TABLET | Freq: Two times a day (BID) | ORAL | Status: DC
  Administered 2019-01-04 – 2019-01-07 (×7): 6.25 mg via ORAL
  Filled 2019-01-04 (×7): qty 1

## 2019-01-04 MED ORDER — HEPARIN (PORCINE) 25000 UT/250ML-% IV SOLN
INTRAVENOUS | Status: AC
  Administered 2019-01-04: 1000 [IU]/h via INTRAVENOUS
  Filled 2019-01-04: qty 250

## 2019-01-04 MED ORDER — ONDANSETRON HCL 4 MG/2ML IJ SOLN: 4.0000 mg | Freq: Four times a day (QID) | INTRAMUSCULAR | Status: DC | PRN

## 2019-01-04 MED ORDER — SODIUM CHLORIDE 0.9% FLUSH: 3.0000 mL | INTRAVENOUS | Status: DC | PRN

## 2019-01-04 MED ORDER — METOLAZONE 2.5 MG PO TABS
2.5000 mg | ORAL_TABLET | Freq: Every day | ORAL | Status: DC | PRN
  Administered 2019-01-04: 2.5 mg via ORAL
  Filled 2019-01-04 (×2): qty 1

## 2019-01-04 MED ORDER — AMIODARONE HCL 200 MG PO TABS
400.0000 mg | ORAL_TABLET | Freq: Two times a day (BID) | ORAL | Status: DC
  Administered 2019-01-04 – 2019-01-07 (×7): 400 mg via ORAL
  Filled 2019-01-04 (×8): qty 2

## 2019-01-04 MED ORDER — INSULIN ASPART PROT & ASPART (70-30 MIX) 100 UNIT/ML ~~LOC~~ SUSP
20.0000 [IU] | Freq: Two times a day (BID) | SUBCUTANEOUS | Status: DC
  Administered 2019-01-04 – 2019-01-06 (×5): 20 [IU] via SUBCUTANEOUS
  Filled 2019-01-04 (×5): qty 10

## 2019-01-04 NOTE — TOC Initial Note (Signed)
Transition of Care [facility]) - Initial/Assessment Note    Patient Details  Name: Martin Bush MRN: [id number] Date of Birth: 1955-02-20  [facility]) CM/SW Contact:    Trecia Rogers, [place] Phone Number: 01/04/2019, 9:50 AM  Clinical Narrative:                  Patient came from home with shortness of breath. Patient lives at home with his wife. Patient stated that he was not able to breathe, so he came into the hospital. Patient does have DMEs at home: CPAP and Oxygen. Patient stated that he cannot remember what company he got them from, but he does use the DMEs at home.  Patient's PCP is: Tommi Rumps, MD. Patient's pharmacies are: [facility]. [facility] not [facility] them from [facility]. Patient does not have any concerns or issues obtaining his medications.   Patient stated that he is good to just go home with the DMEs that he already uses.  No other needs are needed.   Expected Discharge Plan: Home/Self Care Barriers to Discharge: Continued Medical Work up   Patient Goals and CMS Choice Patient states their goals for this hospitalization and ongoing recovery are:: "get to breathing" CMS Medicare.gov Compare Post Acute Care list provided to:: Patient Choice offered to / list presented to : Patient  Expected Discharge Plan and Services Expected Discharge Plan: Home/Self Care In-house Referral: Clinical Social Work     Living arrangements for the past 2 months: Single Family Home                                      Prior Living Arrangements/Services Living arrangements for the past 2 months: Single Family Home Lives with:: Self, Spouse Patient language and need for interpreter reviewed:: Yes Do you feel safe going back to the place where you live?: Yes      Need for Family Participation in Patient Care: Yes (Comment)(Pt's wife)   Current home services: DME(CPAP and  Oxygen) Criminal Activity/Legal Involvement Pertinent to Current Situation/Hospitalization: No - Comment as needed  Activities of Daily Living      Permission Sought/Granted                  Emotional Assessment Appearance:: Appears stated age Attitude/Demeanor/Rapport: Gracious, Charismatic, Engaged Affect (typically observed): Accepting, Adaptable Orientation: : Oriented to Self, Oriented to Place, Oriented to  Time, Oriented to Situation   Psych Involvement: No (comment)  Admission diagnosis:  NSTEMI (non-ST elevated myocardial infarction) [facility]) [I21.4] Patient Active Problem List   Diagnosis Date Noted  . NSTEMI (non-ST elevated myocardial infarction) ([place]) 01/04/2019  . Hx of CABG 01/04/2019  . Acute respiratory failure ([place]) 01/04/2019  . 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 History of colonic polyps 11/12/2018  . Abnormal colonoscopy 11/12/2018  . Ischemic cardiomyopathy 09/27/2018  . Encounter for assessment of implantable cardioverter-defibrillator (ICD) 09/27/2018  . Acute on chronic combined systolic and diastolic CHF (congestive heart failure) ([place]) 09/27/2018  . Enrolled in clinical trial of drug 06/13/2018  . Weight loss 09/19/2017  . Ventricular tachycardia ([place])   . ICD (implantable cardioverter-defibrillator) discharge 08/02/2017  . Obesity 06/17/2017  . CHF (congestive heart failure) ([place]) 07/23/2015  . Hyperlipidemia 07/11/2015  . HTN (hypertension), benign 07/11/2015  . Coronary artery disease involving autologous artery coronary bypass graft  without angina pectoris 07/11/2015  . Depression 07/11/2015  . CKD (chronic kidney disease) stage 3, GFR 30-59 ml/min 07/11/2015  . OSA on CPAP 07/11/2015  . Lightheadedness 07/11/2015  . Carotid artery stenosis   . S/P eye surgery 05/15/2015  . Injury of left toe 04/27/2015  . Chronic low back pain 02/13/2015  . Insulin dependent type 2 diabetes mellitus ([place]) 02/13/2015  . Dizziness 02/05/2015   . ICD; Biventricular  Medtronic ICD Amplia MRI QWuad CRT-D  in situ 10/29/14 10/29/2014   PCP:  [name], MD Pharmacy:   [place], [address] [place] [address] Phone: [phone] Fax: [place], [place] - [address] [place] [phone] [place] [address] Phone: [phone] Fax: [phone]     Social Determinants of Health ([place]) Interventions    Readmission Risk Interventions Readmission Risk Prevention Plan 01/04/2019  Transportation Screening Complete  Home Care Screening Complete  Some recent data might be hidden

## 2019-01-04 NOTE — Progress Notes (Signed)
RT note: Patient conscious and alert.  Removed from Bipap to 2L nasal cannula.  Denies shortness of breath at this time.  Work of breathing normal.  Pt conversive in full sentences.  Vital signs stable.

## 2019-01-04 NOTE — Progress Notes (Signed)
[name] for Heparin Indication: chest pain/ACS  Allergies  Allergen Reactions  . Diltiazem Rash    Patient Measurements: Height: 5\' 7"  (170.2 cm) Weight: 216 lb (98 kg) IBW/kg (Calculated) : 66.1 HEPARIN DW (KG): 87.2  Vital Signs: Temp: 97.5 F (36.4 C) (12/10 1944) Temp Source: Axillary (12/10 1944) BP: 95/60 (12/10 1944) Pulse Rate: 72 (12/10 1944)  Labs: Recent Labs    01/03/19 2132 01/04/19 0044 01/04/19 0640 01/04/19 1051 01/04/19 1553 01/04/19 1710 01/04/19 1800  HGB 15.2  --  13.3  --   --   --   --   HCT 47.5  --  40.0  --   --   --   --   PLT 312  --  185  --   --   --   --   LABPROT 13.1  --   --   --   --   --   --   INR 1.0  --   --   --   --   --   --   HEPARINUNFRC  --   --   --  0.24*  --   --  0.41  CREATININE 1.54*  --  1.41*  --   --   --   --   TROPONINIHS 39* 2,747*  --   --  11,045* 9,144*  --     Estimated Creatinine Clearance: 59.8 mL/min (A) (by C-G formula based on SCr of 1.41 mg/dL (H)).  Medical History: Past Medical History:  Diagnosis Date  . AICD (automatic cardioverter/defibrillator) present    Medtronic  . Arthritis    "thumbs"  (08/02/2017)  . CHF (congestive heart failure) ([place])   . Chronic combined systolic and diastolic heart failure ([place])    a. 08/2017 Echo: EF 20-25%, mod glob HK. Sev distal ant sept, inflat HK. Apical AK. Gr2 DD. Mildly reduced RV fxn.  . Colon polyps   . Coronary artery disease    a. s/p CABG x 2 (LIMA->LAD, VG->OM); b. Multiple PCI's to LM/LCX/OM; c. 07/2017 PTCA of LM/LCX w/ early ISR-->repeat PCI/DES to LM (3.5x20 Synergy DES) and LCX (3.0x20 Synergy DES); d. 07/2018 Relook Cath: LM patent stent, LAD 100ost, LCX patent stent, OM1 99/60, LIMA->LAD ok. VG->dLCX 68 (old).  . Depression   . Encounter for assessment of implantable cardioverter-defibrillator (ICD) 09/27/2018  . High cholesterol   . Hypertension   . ICD; Biventricular  Medtronic ICD Amplia MRI QWuad  CRT-D  in situ 10/29/14 10/29/2014   Remote ICD check 09.23.20:  One 6 beat NSVT. No therapy.  1 SVT episode @ 130 bpm (38 Sec).  There were 23 Vent sense episodes detected for up to 1.1 min/day (AT). Health trends (patient activity, heart rate variability, average heart rates) are stable.Trans-thoracic impedance trends and the OptiVol Fluid Index do no present significant abnormalities. Battery longevity is 4.3 years. RA pacing is 3  . Ischemic cardiomyopathy 09/27/2018  . MI (myocardial infarction) ([place]) 2003  . NSTEMI (non-ST elevated myocardial infarction) ([place]) 07/16/2014  . OSA on CPAP   . Oxygen deficiency   . Pneumonia 10/2014  . Proteinuria   . Sleep apnea   . 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 amiodarone (PACERONE) 200 MG tablet Take 200 mg by mouth daily.    01/03/2019 at Unknown time  . aspirin EC  81 MG tablet Take 81 mg by mouth daily.   01/03/2019 at Unknown time  . carvedilol (COREG) 12.5 MG tablet Take 6.25 mg by mouth 2 (two) times daily with a meal.   01/03/2019 at Unknown time  . Cholecalciferol (VITAMIN D) 2000 units tablet Take 2,000 Units by mouth 2 (two) times daily.   01/03/2019 at Unknown time  . empagliflozin (JARDIANCE) 25 MG TABS tablet Take 25 mg by mouth daily.   01/03/2019 at Unknown time  . ezetimibe (ZETIA) 10 MG tablet Take 1 tablet (10 mg total) by mouth daily. (Patient taking differently: Take 10 mg by mouth at bedtime. ) 90 tablet 3 01/02/2019 at Unknown time  . insulin aspart protamine - aspart (NOVOLOG MIX 70/30 FLEXPEN) (70-30) 100 UNIT/ML FlexPen Inject 63 Units into the skin 2 (two) times daily as needed (as needed for blood sugar over 200). as needed for blood sugar over 200   01/03/2019 at Unknown time  . isosorbide mononitrate (IMDUR) 30 MG 24 hr tablet Take 30 mg by mouth daily.   01/03/2019 at Unknown time  . liraglutide (VICTOZA) 18 MG/3ML SOPN  Inject 1.8 mg into the skin daily.   01/03/2019 at Unknown time  . metFORMIN (GLUCOPHAGE-XR) 500 MG 24 hr tablet Take 500 mg by mouth 2 (two) times daily.    01/03/2019 at Unknown time  . metolazone (ZAROXOLYN) 2.5 MG tablet Take 2.5 mg by mouth daily as needed (for >2 lb weight gain).   prn at prn  . Omega-3 Fatty Acids (FISH OIL) 1000 MG CAPS Take 1,000 mg by mouth 2 (two) times daily.    01/03/2019 at Unknown time  . potassium chloride SA (K-DUR,KLOR-CON) 20 MEQ tablet Take 1 tablet (20 mEq total) by mouth 2 (two) times daily. 60 tablet 3 01/03/2019 at Unknown time  . PRESCRIPTION MEDICATION Inhale into the lungs at bedtime. CPAP   01/02/2019 at Unknown time  . rosuvastatin (CRESTOR) 40 MG tablet Take 40 mg by mouth at bedtime.   01/02/2019 at Unknown time  . sacubitril-valsartan (ENTRESTO) 97-103 MG Take 1 tablet by mouth 2 (two) times daily.    01/03/2019 at Unknown time  . sertraline (ZOLOFT) 100 MG tablet Take 50 mg by mouth at bedtime.    01/02/2019 at Unknown time  . ticagrelor (BRILINTA) 90 MG TABS tablet Take 1 tablet (90 mg total) by mouth 2 (two) times daily. 60 tablet 3 01/03/2019 at Unknown time  . torsemide (DEMADEX) 20 MG tablet Take 1 tablet (20 mg total) by mouth 2 (two) times daily. (Patient taking differently: Take 20 mg by mouth 2 (two) times daily as needed (fluid). ) 180 tablet 3 prn at prn    Assessment: Pharmacy asked to initiate and monitor Heparin for ACS.  Heparin was initiated during computer downtime.  Pt not on anticoagulants per med list.    12/10 @ 1051 HL: 0.24 (HL was supposed to be drawn 6 hours after infusion started but it is late). Though likely will not impact level. Confirmed with ED nurse no heparin interruption.   Goal of Therapy:  Heparin level 0.3-0.7 units/ml Monitor platelets by anticoagulation protocol: Yes   Plan:  12/10 1800 HL 0.41 therapeutic x 1. Continue heparin at 1150 units/hr. HL at midnight to confirm. CBC daily while on heparin drip.   Tawnya Crook, PharmD 01/04/2019,7:48 PM

## 2019-01-04 NOTE — ED Notes (Signed)
admittin gMD at the bedside for pt evaluation. Pt alert and talkative with wife at the bedside. Continues to improve and remains on bi-pap. Denies chest pain at this time.

## 2019-01-04 NOTE — Progress Notes (Signed)
[place] for Heparin Indication: chest pain/ACS  Allergies  Allergen Reactions  . Diltiazem Rash    Patient Measurements: Height: 5\' 7"  (170.2 cm) Weight: 216 lb (98 kg) IBW/kg (Calculated) : 66.1 HEPARIN DW (KG): 87.2  Vital Signs: BP: 119/81 (12/10 1033) Pulse Rate: 69 (12/10 1033)  Labs: Recent Labs    01/03/19 2132 01/04/19 0044 01/04/19 0640 01/04/19 1051  HGB 15.2  --  13.3  --   HCT 47.5  --  40.0  --   PLT 312  --  185  --   LABPROT 13.1  --   --   --   INR 1.0  --   --   --   HEPARINUNFRC  --   --   --  0.24*  CREATININE 1.54*  --  1.41*  --   TROPONINIHS 39* 2,747*  --   --     Estimated Creatinine Clearance: 59.8 mL/min (A) (by C-G formula based on SCr of 1.41 mg/dL (H)).  Medical History: Past Medical History:  Diagnosis Date  . AICD (automatic cardioverter/defibrillator) present    Medtronic  . Arthritis    "thumbs"  (08/02/2017)  . CHF (congestive heart failure) ([place])   . Chronic combined systolic and diastolic heart failure ([place])    a. 08/2017 Echo: EF 20-25%, mod glob HK. Sev distal ant sept, inflat HK. Apical AK. Gr2 DD. Mildly reduced RV fxn.  . Colon polyps   . Coronary artery disease    a. s/p CABG x 2 (LIMA->LAD, VG->OM); b. Multiple PCI's to LM/LCX/OM; c. 07/2017 PTCA of LM/LCX w/ early ISR-->repeat PCI/DES to LM (3.5x20 Synergy DES) and LCX (3.0x20 Synergy DES); d. 07/2018 Relook Cath: LM patent stent, LAD 100ost, LCX patent stent, OM1 99/60, LIMA->LAD ok. VG->dLCX 11 (old).  . Depression   . Encounter for assessment of implantable cardioverter-defibrillator (ICD) 09/27/2018  . High cholesterol   . Hypertension   . ICD; Biventricular  Medtronic ICD Amplia MRI QWuad CRT-D  in situ 10/29/14 10/29/2014   Remote ICD check 09.23.20:  One 6 beat NSVT. No therapy.  1 SVT episode @ 130 bpm (38 Sec).  There were 23 Vent sense episodes detected for up to 1.1 min/day (AT). Health trends (patient activity, heart rate  variability, average heart rates) are stable.Trans-thoracic impedance trends and the OptiVol Fluid Index do no present significant abnormalities. Battery longevity is 4.3 years. RA pacing is 3  . Ischemic cardiomyopathy 09/27/2018  . MI (myocardial infarction) ([place]) 2003  . NSTEMI (non-ST elevated myocardial infarction) ([place]) 07/16/2014  . OSA on CPAP   . Oxygen deficiency   . Pneumonia 10/2014  . Proteinuria   . Sleep apnea   . Type II diabetes mellitus (HCC)    insulin dependent    Medications:  (Not in a hospital admission)   Assessment: Pharmacy asked to initiate and monitor Heparin for ACS.  Heparin was initiated during computer downtime.  Pt not on anticoagulants per med list.    12/10 @ 1051 HL: 0.24 (HL was supposed to be drawn 6 hours after infusion started but it is late). Though likely will not impact level. Confirmed with ED nurse no heparin interruption.   Goal of Therapy:  Heparin level 0.3-0.7 units/ml Monitor platelets by anticoagulation protocol: Yes   Plan:  Baseline labs have been ordered  Heparin DW: 87.2 kg  Give 1300 units bolus x 1 Increase heparin infusion at 1150 units/hr Check anti-Xa level in 6 hours and daily  while on heparin, per protocol Continue to monitor H&H and platelets   Martin Bush 01/04/2019,11:28 AM

## 2019-01-04 NOTE — Telephone Encounter (Signed)
I called patient and he is still in the hospital waiting to be admitted.

## 2019-01-04 NOTE — H&P (Signed)
History and Physical    Ly Stencil [id number] DOB: Jan 22, 1956 DOA: 01/03/2019  PCP: [name], MD  Patient coming from: home  I have personally briefly reviewed patient's old medical records in [place]  Chief Complaint: Shortness of breath and chest pain  HPI: Martin Bush is a 63 y.o. 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  O2 sat recorded by EMS was in the 84s.  As of the history is taken by his wife at the bedside who stated that patient was in his usual state of health until earlier in the day on the day of arrival and according to his wife he was helping move some furniture and he developed back pain radiating across the scapula and shoulder pain that felt like heaviness.  He had no associated nausea vomiting or diaphoresis and no palpitations.  His pain improved but then his wife noticed that he was having increasing shortness of breath which rapidly progressed prompted her to call EMS.  Patient was reportedly diaphoretic on arrival.  Due to his respiratory distress was placed on BiPAP in the emergency room with plans to intubate however he rapidly improved.  Patient did not have active chest pain in the emergency room.  O2 sat had improved to 98% on BiPAP.  Blood pressure was 156/98 pulse 97 and respirations 21.  Showed EKG showed no acute ST-T wave changes.  NP was elevated at 1200 and initial troponin was elevated at 39.  Cell count was elevated at 15,000.  Peak troponin after 2 hours is 2700.  And had no chest pain during this time.  Repeat EKG also showed no changes.  Patient had a CTA chest showed no PE but  did show bilateral pleural effusions and pulmonary edema. Contacted cardiologist, Dr.   Review of Systems: As per HPI otherwise 10 point review of systems negative.    Past Medical History:  Diagnosis Date  . AICD (automatic cardioverter/defibrillator) present    Medtronic  . Arthritis    "thumbs"  (08/02/2017)  . CHF (congestive heart failure) ([place])   . Chronic combined systolic and diastolic heart failure ([place]) 09/27/2018  . Colon polyps   . Coronary artery disease   . Depression   . Encounter for assessment of implantable cardioverter-defibrillator (ICD) 09/27/2018  . High cholesterol   . Hypertension   . ICD; Biventricular  Medtronic ICD Amplia MRI QWuad CRT-D  in situ 10/29/14 10/29/2014   Remote ICD check 09.23.20:  One 6 beat NSVT. No therapy.  1 SVT episode @ 130 bpm (38 Sec).  There were 23 Vent sense episodes detected for up to 1.1 min/day (AT). Health trends (patient activity, heart rate variability, average heart rates) are stable.Trans-thoracic impedance trends and the OptiVol Fluid Index do no present significant abnormalities. Battery longevity is 4.3 years. RA pacing is 3  . Ischemic cardiomyopathy 09/27/2018  . MI (myocardial infarction) ([place]) 2003  . NSTEMI (non-ST elevated myocardial infarction) ([place]) 07/16/2014  . OSA on CPAP   . Oxygen deficiency   . Pneumonia 10/2014  . Proteinuria   . Sleep apnea   . Type II diabetes mellitus ([place])  insulin dependent    Past Surgical History:  Procedure Laterality Date  . BIOPSY  09/19/2018   Procedure: BIOPSY;  Surgeon: Thornton Park, MD;  Location: [facility];  Service: Gastroenterology;;  . CARDIAC CATHETERIZATION N/A 07/16/2014   Procedure: Left Heart Cath and Coronary Angiography;  Surgeon: Adrian Prows, MD;  Location: [place] [facility];  Service: Cardiovascular;  Laterality: N/A;  . CARDIAC CATHETERIZATION  07/16/2014   Procedure: Coronary Balloon Angioplasty;  Surgeon: Adrian Prows, MD;  Location: [place] [facility];   Service: Cardiovascular;;  . CARDIAC CATHETERIZATION  2003  . CARDIAC DEFIBRILLATOR PLACEMENT  2016  . CATARACT EXTRACTION W/ INTRAOCULAR LENS IMPLANT Left 03/2014  . COLONOSCOPY    . COLONOSCOPY WITH PROPOFOL N/A 09/19/2018   Procedure: COLONOSCOPY WITH PROPOFOL;  Surgeon: Thornton Park, MD;  Location: [facility];  Service: Gastroenterology;  Laterality: N/A;  . CORONARY ANGIOPLASTY WITH STENT PLACEMENT     "I've got a total of 8 stents in there; mostly doine at [facility]" (08/02/2017)  . CORONARY ARTERY BYPASS GRAFT  ~ 2003   "CABG X2"; [facility]  . CORONARY ATHERECTOMY N/A 08/16/2017   Procedure: CORONARY ATHERECTOMY;  Surgeon: Nigel Mormon, MD;  Location: [place] [facility];  Service: Cardiovascular;  Laterality: N/A;  . CORONARY BALLOON ANGIOPLASTY N/A 08/04/2017   Procedure: CORONARY BALLOON ANGIOPLASTY;  Surgeon: Adrian Prows, MD;  Location: [place] [facility];  Service: Cardiovascular;  Laterality: N/A;  . CORONARY STENT INTERVENTION N/A 08/16/2017   Procedure: CORONARY STENT INTERVENTION;  Surgeon: Nigel Mormon, MD;  Location: [place] [facility];  Service: Cardiovascular;  Laterality: N/A;  . ELBOW SURGERY Left ?2001   "pinched nerve"  . ENDOSCOPIC MUCOSAL RESECTION  12/18/2018   Procedure: ENDOSCOPIC MUCOSAL RESECTION;  Surgeon: Rush Landmark Telford Nab., MD;  Location: [place];  Service: Gastroenterology;;  . Otho Darner SIGMOIDOSCOPY N/A 12/18/2018   Procedure: Beryle Quant;  Surgeon: Irving Copas., MD;  Location: [facility];  Service: Gastroenterology;  Laterality: N/A;  . HEMOSTASIS CLIP PLACEMENT  12/18/2018   Procedure: HEMOSTASIS CLIP PLACEMENT;  Surgeon: Irving Copas., MD;  Location: [place];  Service: Gastroenterology;;  . LEFT HEART CATH AND CORS/GRAFTS ANGIOGRAPHY N/A 08/04/2017   Procedure: LEFT HEART CATH AND CORS/GRAFTS ANGIOGRAPHY;  Surgeon: Adrian Prows, MD;  Location: [place] [facility];  Service:  Cardiovascular;  Laterality: N/A;  . LEFT HEART CATH AND CORS/GRAFTS ANGIOGRAPHY N/A 08/20/2017   Procedure: LEFT HEART CATH AND CORS/GRAFTS ANGIOGRAPHY;  Surgeon: Nigel Mormon, MD;  Location: [place] [facility];  Service: Cardiovascular;  Laterality: N/A;  . POLYPECTOMY  09/19/2018   Procedure: POLYPECTOMY;  Surgeon: Thornton Park, MD;  [facility];  Service: Gastroenterology;;  . Lia Foyer INJECTION  09/19/2018   Procedure: SUBMUCOSAL INJECTION;  Surgeon: Thornton Park, MD;  [facility];  Service: Gastroenterology;;  . Lia Foyer LIFTING INJECTION  12/18/2018   Procedure: SUBMUCOSAL LIFTING INJECTION;  Surgeon: Irving Copas., MD;  Location: [place];  Service: Gastroenterology;;     reports that he quit smoking about 18 years ago. His smoking use included cigars. He quit after 27.00 years of use. He quit smokeless tobacco use about 18 years ago. He reports previous alcohol use. He reports that he does not use drugs.  Allergies  Allergen Reactions  . Diltiazem Rash    Family History  Problem Relation Age of Onset  . Uterine cancer Mother   . Lung cancer Mother   . Hyperlipidemia Father   . Heart disease  Father   . Hypertension Father   . Diabetes Father   . Colon cancer Neg Hx   . Esophageal cancer Neg Hx   . Colon polyps Neg Hx   . Rectal cancer Neg Hx   . Stomach cancer Neg Hx   . Inflammatory bowel disease Neg Hx   . Liver disease Neg Hx   . Pancreatic cancer Neg Hx     Unacceptable: Noncontributory, unremarkable, or negative. Acceptable: (example)Family history negative for heart disease  Prior to Admission medications   Medication Sig Start Date End Date Taking? Authorizing Provider  amiodarone (PACERONE) 200 MG tablet Take 200 mg by mouth daily.  11/19/14  Yes [provider]  aspirin EC 81 MG tablet Take 81 mg by mouth daily.   Yes [provider]  carvedilol (COREG) 12.5 MG tablet Take 6.25 mg by  mouth 2 (two) times daily with a meal.   Yes [provider]  Cholecalciferol (VITAMIN D) 2000 units tablet Take 2,000 Units by mouth 2 (two) times daily.   Yes [provider]  empagliflozin (JARDIANCE) 25 MG TABS tablet Take 25 mg by mouth daily.   Yes [provider]  ezetimibe (ZETIA) 10 MG tablet Take 1 tablet (10 mg total) by mouth daily. Patient taking differently: Take 10 mg by mouth at bedtime.  10/26/18  Yes [name], MD  insulin aspart protamine - aspart (NOVOLOG MIX 70/30 FLEXPEN) (70-30) 100 UNIT/ML FlexPen Inject 63 Units into the skin 2 (two) times daily as needed (as needed for blood sugar over 200). as needed for blood sugar over 200   Yes [provider]  isosorbide mononitrate (IMDUR) 30 MG 24 hr tablet Take 30 mg by mouth daily.   Yes [provider]  liraglutide (VICTOZA) 18 MG/3ML SOPN Inject 1.8 mg into the skin daily.   Yes [provider]  metFORMIN (GLUCOPHAGE-XR) 500 MG 24 hr tablet Take 500 mg by mouth 2 (two) times daily.    Yes [provider]  metolazone (ZAROXOLYN) 2.5 MG tablet Take 2.5 mg by mouth daily as needed (for >2 lb weight gain).   Yes [provider]  Omega-3 Fatty Acids (FISH OIL) 1000 MG CAPS Take 1,000 mg by mouth 2 (two) times daily.    Yes [provider]  potassium chloride SA (K-DUR,KLOR-CON) 20 MEQ tablet Take 1 tablet (20 mEq total) by mouth 2 (two) times daily. 01/07/18  Yes Erskine Squibb, MD  PRESCRIPTION MEDICATION Inhale into the lungs at bedtime. CPAP   Yes [provider]  rosuvastatin (CRESTOR) 40 MG tablet Take 40 mg by mouth at bedtime.   Yes [provider]  sacubitril-valsartan (ENTRESTO) 97-103 MG Take 1 tablet by mouth 2 (two) times daily.    Yes [provider]  sertraline (ZOLOFT) 100 MG tablet Take 50 mg by mouth at bedtime.    Yes [provider]  ticagrelor (BRILINTA) 90 MG TABS tablet Take 1 tablet (90 mg  total) by mouth 2 (two) times daily. 08/05/17  Yes Adrian Prows, MD  torsemide (DEMADEX) 20 MG tablet Take 1 tablet (20 mg total) by mouth 2 (two) times daily. Patient taking differently: Take 20 mg by mouth 2 (two) times daily as needed (fluid).  10/19/18  Yes Miquel Dunn, NP    Physical Exam: Vitals:   01/04/19 0030 01/04/19 0100 01/04/19 0200 01/04/19 0300  BP: 140/84 114/70 122/85 123/82  Pulse: 84 75 73 69  Resp: 18 17 19  16  Temp:      TempSrc:      SpO2: 100% 97% 98% 98%  Weight:      Height:        Constitutional: NAD, calm, comfortable Vitals:   01/04/19 0030 01/04/19 0100 01/04/19 0200 01/04/19 0300  BP: 140/84 114/70 122/85 123/82  Pulse: 84 75 73 69  Resp: 18 17 19 16   Temp:      TempSrc:      SpO2: 100% 97% 98% 98%  Weight:      Height:       General: Patient awake and alert, smiling.  Has BiPAP mask on so difficult to converse Eyes: PERRL, lids and conjunctivae normal ENMT: Mucous membranes are moist.  Currently on BiPAP normal, supple, no masses, no thyromegaly Respiratory: Bilateral rales.  Patient tachypneic. Cardiovascular: Regular rate and rhythm, no murmurs / rubs / gallops. No extremity edema.  Abdomen: no tenderness, no masses palpated. No hepatosplenomegaly. Bowel sounds positive.  Musculoskeletal: no clubbing / cyanosis. No joint deformity upper and lower extremities. Good ROM, no contractures. Normal muscle tone.  Skin: no rashes, lesions, ulcers. No induration Neurologic: CN 2-12 grossly intact. Sensation intact, DTR normal. Strength 5/5 in all 4.  Psychiatric: Normal judgment and insight. Alert and oriented x 3. Normal mood.     Labs on Admission: I have personally reviewed following labs and imaging studies  CBC: Recent Labs  Lab 01/03/19 2132  WBC 15.8*  NEUTROABS 9.5*  HGB 15.2  HCT 47.5  MCV 95.8  PLT 123456   Basic Metabolic Panel: Recent Labs  Lab 01/03/19 2132  NA 134*  K 3.9  CL 101  CO2 20*  GLUCOSE 573*  BUN 20   CREATININE 1.54*  CALCIUM 9.1   GFR: Estimated Creatinine Clearance: 54.8 mL/min (A) (by C-G formula based on SCr of 1.54 mg/dL (H)). Liver Function Tests: Recent Labs  Lab 01/03/19 2132  AST 21  ALT 15  ALKPHOS 94  BILITOT 0.6  PROT 8.0  ALBUMIN 4.3   No results for input(s): LIPASE, AMYLASE in the last 168 hours. No results for input(s): AMMONIA in the last 168 hours. Coagulation Profile: Recent Labs  Lab 01/03/19 2132  INR 1.0   Cardiac Enzymes: No results for input(s): CKTOTAL, CKMB, CKMBINDEX, TROPONINI in the last 168 hours. BNP (last 3 results) No results for input(s): PROBNP in the last 8760 hours. HbA1C: No results for input(s): HGBA1C in the last 72 hours. CBG: No results for input(s): GLUCAP in the last 168 hours. Lipid Profile: No results for input(s): CHOL, HDL, LDLCALC, TRIG, CHOLHDL, LDLDIRECT in the last 72 hours. Thyroid Function Tests: No results for input(s): TSH, T4TOTAL, FREET4, T3FREE, THYROIDAB in the last 72 hours. Anemia Panel: No results for input(s): VITAMINB12, FOLATE, FERRITIN, TIBC, IRON, RETICCTPCT in the last 72 hours. Urine analysis:    Component Value Date/Time   COLORURINE YELLOW 08/02/2017 1444   APPEARANCEUR CLEAR 08/02/2017 1444   LABSPEC 1.012 08/02/2017 1444   PHURINE 5.0 08/02/2017 1444   GLUCOSEU >=500 (A) 08/02/2017 1444   HGBUR NEGATIVE 08/02/2017 1444   BILIRUBINUR NEGATIVE 08/02/2017 1444   KETONESUR NEGATIVE 08/02/2017 1444   PROTEINUR NEGATIVE 08/02/2017 1444   NITRITE NEGATIVE 08/02/2017 1444   LEUKOCYTESUR NEGATIVE 08/02/2017 1444    Radiological Exams on Admission: CT ANGIO CHEST PE W OR WO CONTRAST  Result Date: 01/04/2019 CLINICAL DATA:  PE suspected, intermediate prob, positive D-dimer. Respiratory distress. Hypoxia. EXAM: CT ANGIOGRAPHY CHEST WITH CONTRAST TECHNIQUE: Multidetector CT imaging of the  chest was performed using the standard protocol during bolus administration of intravenous contrast.  Multiplanar CT image reconstructions and MIPs were obtained to evaluate the vascular anatomy. CONTRAST:  157mL OMNIPAQUE IOHEXOL 350 MG/ML SOLN COMPARISON:  Chest radiograph earlier this day. No prior chest CT. FINDINGS: Cardiovascular: There are no filling defects within the pulmonary arteries to suggest pulmonary embolus. Moderate multi chamber cardiomegaly. Left-sided pacemaker with leads in the right atrium and ventricle. Post CABG. Atherosclerosis of the thoracic aorta. Cannot assess for dissection given phase of contrast tailored to pulmonary artery evaluation. There is no pericardial effusion. Mediastinum/Nodes: Shotty mediastinal lymph nodes with 9 mm right lower paratracheal node. There is a 12 mm right hilar node. No visualized thyroid nodule. No esophageal thickening. Lungs/Pleura: Small right and trace left pleural effusion. Adjacent atelectasis in the right lower lobe. Smooth septal thickening with patchy ground-glass opacities within both lungs, slightly more prominent on the right. There is fluid within fissures bilaterally. Mild lower lobe bronchial thickening is likely congestive. No evidence of pulmonary mass. Trachea and mainstem bronchi are patent. Upper Abdomen: Fluid distended stomach without gastric wall thickening. Musculoskeletal: Post median sternotomy. Degenerative change in the spine. There are no acute or suspicious osseous abnormalities. Review of the MIP images confirms the above findings. IMPRESSION: 1. No pulmonary embolus. 2. CHF. Cardiomegaly with small bilateral pleural effusions and pulmonary edema. Ground-glass opacities in both lungs are most consistent with pulmonary edema, an element of superimposed atypical viral pneumonia cannot be excluded on imaging findings alone, but is felt less likely. 3. Shotty mediastinal and right hilar lymph nodes are likely reactive. Aortic Atherosclerosis (ICD10-I70.0). Electronically Signed   By: Keith Rake M.D.   On: 01/04/2019 00:07    DG Chest Portable 1 View  Result Date: 01/03/2019 CLINICAL DATA:  Acute respiratory distress, O2 sat 70-80% upon EMS arrival EXAM: PORTABLE CHEST 1 VIEW COMPARISON:  Radiograph 08/20/2017 FINDINGS: Postsurgical changes related to prior CABG including intact and aligned sternotomy wires and multiple surgical clips projecting over the mediastinum. Coronary stent overlies the cardiac silhouette. Three lead pacer/defibrillator pack overlies the left chest wall with leads at the apex, right atrium, and coronary sinus. Telemetry leads overlie the chest. Diffuse interstitial and airspace opacities throughout the lungs with some fluid thickening the right minor fissure. There is marked cardiomegaly, increased from comparison. No visible pneumothorax or evidence of left pleural fluid. No acute osseous or soft tissue abnormality. IMPRESSION: Marked cardiomegaly with diffuse interstitial and airspace opacities throughout the lungs. The findings are most compatible with moderate to severe CHF/volume overload. Please note, infection or inflammation could present with similar findings. Electronically Signed   By: Lovena Le M.D.   On: 01/03/2019 22:14    EKG: Independently reviewed.   Assessment/Plan Principal Problem: Acute respiratory failure with hypoxia --Secondary to heart failure exacerbation --Continue BiPAP.  Patient already improving on BiPAP --Treat cardiac issues as outlined below   NSTEMI (non-ST elevated myocardial infarction) (HCC) --Troponin 39>>2747 --Suspect NSTEMI --Continue Heparin infusion started in the emergency room --Antiplatelets, beta blockers, nitrates, morphine as needed chest pain --Tenuous cardiac monitoring --Cardiology consult.  Discussed with Dr. Harrell Gave   Acute on chronic combined systolic and diastolic CHF (congestive heart failure) (HCC) --IV Lasix, Nitropaste, continue home beta-blockers, entresto --Daily weights with salt and fluid restriction --Input and  output monitoring Hyperglycemia and insulin dependent type 2 diabetes mellitus ([place]) --Blood sugar was elevated.  Last A1c was 11.2 about a week prior --preMixed insulin restarted but at a lower dose supplemental  insulin coverage --Consistent carbohydrate diet   HTN (hypertension), benign --New home meds   CKD (chronic kidney disease) stage 3, GFR 30-59 ml/min --Monitor renal function consult renal if worsening       DVT prophylaxis: Patient is on full dose  code Status: Full code Family Communication: Spoke with wife at bedside about diagnoses and plan of care.  Stands answered Consults called: Return consult with Dr. Harrell Gave, cardiologist Admission status: Inpatient Athena Masse MD [facility] Pager [phone] If 7PM-7AM, please contact night-coverage [email or web address] Password [email or web address]  01/04/2019, 5:06 AM

## 2019-01-04 NOTE — ED Notes (Signed)
Pt provided with meal tray and diet shasta cola

## 2019-01-04 NOTE — ED Notes (Signed)
Pt returned from CT. Resting quietly with wife at the bedside. No acute distress noted. Pt reports he is feeling much better.

## 2019-01-04 NOTE — Consult Note (Signed)
Cardiology Consult    Patient ID: Martin Bush MRN: [id number], DOB/AGE: [birth year]   Admit date: 01/03/2019 Date of Consult: 01/04/2019  Primary Physician: [name], MD Primary Cardiologist: Christen Butter, MD Requesting Provider: Chauncey Cruel. Kurtis Bushman, MD  Patient Profile    Martin Bush is a 63 y.o. male with a history of CAD s/p CABG and subsequent LM and LCX/OM interventions, ICM/HFrEF s/p AICD, HTN, HL, DMII, obesity, and OSA on CPAP w/ O2, who is being seen today for the evaluation of dyspnea/Acute pulmonary edmea at the request of Dr. Kurtis Bushman.  Past Medical History   Past Medical History:  Diagnosis Date  . AICD (automatic cardioverter/defibrillator) present    Medtronic  . Arthritis    "thumbs"  (08/02/2017)  . CHF (congestive heart failure) ([place])   . Chronic combined systolic and diastolic heart failure ([place])    a. 08/2017 Echo: EF 20-25%, mod glob HK. Sev distal ant sept, inflat HK. Apical AK. Gr2 DD. Mildly reduced RV fxn.  . Colon polyps   . Coronary artery disease    a. s/p CABG x 2 (LIMA->LAD, VG->OM); b. Multiple PCI's to LM/LCX/OM; c. 07/2017 PTCA of LM/LCX w/ early ISR-->repeat PCI/DES to LM (3.5x20 Synergy DES) and LCX (3.0x20 Synergy DES); d. 07/2018 Relook Cath: LM patent stent, LAD 100ost, LCX patent stent, OM1 99/60, LIMA->LAD ok. VG->dLCX 75 (old).  . Depression   . Encounter for assessment of implantable cardioverter-defibrillator (ICD) 09/27/2018  . High cholesterol   . Hypertension   . ICD; Biventricular  Medtronic ICD Amplia MRI QWuad CRT-D  in situ 10/29/14 10/29/2014   Remote ICD check 09.23.20:  One 6 beat NSVT. No therapy.  1 SVT episode @ 130 bpm (38 Sec).  There were 23 Vent sense episodes detected for up to 1.1 min/day (AT). Health trends (patient activity, heart rate variability, average heart rates) are stable.Trans-thoracic impedance trends and the OptiVol Fluid Index do no present significant abnormalities. Battery longevity is 4.3 years. RA pacing is 3  .  Ischemic cardiomyopathy 09/27/2018  . MI (myocardial infarction) ([place]) 2003  . NSTEMI (non-ST elevated myocardial infarction) ([place]) 07/16/2014  . OSA on CPAP   . Oxygen deficiency   . Pneumonia 10/2014  . Proteinuria   . Sleep apnea   . Type II diabetes mellitus (HCC)    insulin dependent    Past Surgical History:  Procedure Laterality Date  . BIOPSY  09/19/2018   Procedure: BIOPSY;  Surgeon: Thornton Park, MD;  Location: [facility];  Service: Gastroenterology;;  . CARDIAC CATHETERIZATION N/A 07/16/2014   Procedure: Left Heart Cath and Coronary Angiography;  Surgeon: Adrian Prows, MD;  Location: [place] [facility];  Service: Cardiovascular;  Laterality: N/A;  . CARDIAC CATHETERIZATION  07/16/2014   Procedure: Coronary Balloon Angioplasty;  Surgeon: Adrian Prows, MD;  Location: [place] [facility];  Service: Cardiovascular;;  . CARDIAC CATHETERIZATION  2003  . CARDIAC DEFIBRILLATOR PLACEMENT  2016  . CATARACT EXTRACTION W/ INTRAOCULAR LENS IMPLANT Left 03/2014  . COLONOSCOPY    . COLONOSCOPY WITH PROPOFOL N/A 09/19/2018   Procedure: COLONOSCOPY WITH PROPOFOL;  Surgeon: Thornton Park, MD;  Location: [facility];  Service: Gastroenterology;  Laterality: N/A;  . CORONARY ANGIOPLASTY WITH STENT PLACEMENT     "I've got a total of 8 stents in there; mostly doine at [facility]" (08/02/2017)  . CORONARY ARTERY BYPASS GRAFT  ~ 2003   "CABG X2"; [facility]  . CORONARY ATHERECTOMY N/A 08/16/2017   Procedure: CORONARY ATHERECTOMY;  Surgeon: Virgina Jock,  Reynold Bowen, MD;  Location: [place] [facility];  Service: Cardiovascular;  Laterality: N/A;  . CORONARY BALLOON ANGIOPLASTY N/A 08/04/2017   Procedure: CORONARY BALLOON ANGIOPLASTY;  Surgeon: Adrian Prows, MD;  Location: [place] [facility];  Service: Cardiovascular;  Laterality: N/A;  . CORONARY STENT INTERVENTION N/A 08/16/2017   Procedure: CORONARY STENT INTERVENTION;  Surgeon: Nigel Mormon, MD;  Location: [place] [facility];  Service:  Cardiovascular;  Laterality: N/A;  . ELBOW SURGERY Left ?2001   "pinched nerve"  . ENDOSCOPIC MUCOSAL RESECTION  12/18/2018   Procedure: ENDOSCOPIC MUCOSAL RESECTION;  Surgeon: Rush Landmark Telford Nab., MD;  Location: [place];  Service: Gastroenterology;;  . Otho Darner SIGMOIDOSCOPY N/A 12/18/2018   Procedure: Beryle Quant;  Surgeon: Irving Copas., MD;  Location: [place];  Service: Gastroenterology;  Laterality: N/A;  . HEMOSTASIS CLIP PLACEMENT  12/18/2018   Procedure: HEMOSTASIS CLIP PLACEMENT;  Surgeon: Irving Copas., MD;  Location: [place];  Service: Gastroenterology;;  . LEFT HEART CATH AND CORS/GRAFTS ANGIOGRAPHY N/A 08/04/2017   Procedure: LEFT HEART CATH AND CORS/GRAFTS ANGIOGRAPHY;  Surgeon: Adrian Prows, MD;  Location: [place] [facility];  Service: Cardiovascular;  Laterality: N/A;  . LEFT HEART CATH AND CORS/GRAFTS ANGIOGRAPHY N/A 08/20/2017   Procedure: LEFT HEART CATH AND CORS/GRAFTS ANGIOGRAPHY;  Surgeon: Nigel Mormon, MD;  Location: [place] [facility];  Service: Cardiovascular;  Laterality: N/A;  . POLYPECTOMY  09/19/2018   Procedure: POLYPECTOMY;  Surgeon: Thornton Park, MD;  [facility];  Service: Gastroenterology;;  . Lia Foyer INJECTION  09/19/2018   Procedure: SUBMUCOSAL INJECTION;  Surgeon: Thornton Park, MD;  Location: [facility];  Service: Gastroenterology;;  . Lia Foyer LIFTING INJECTION  12/18/2018   Procedure: SUBMUCOSAL LIFTING INJECTION;  Surgeon: Irving Copas., MD;  Location: [facility];  Service: Gastroenterology;;     Allergies  Allergies  Allergen Reactions  . Diltiazem Rash    History of Present Illness    63 year old male with a history of CAD status post CABG and subsequent left main and circumflex/obtuse marginal interventions, ischemic cardiomyopathy/HFrEF status post AICD, hypertension, hyperlipidemia, type 2 diabetes mellitus, obesity, and sleep apnea on CPAP.  He is  followed by Dr. Einar Gip in [place].  In the summer 2019, he underwent repeat catheterization and percutaneous interventions in the setting of left main and circumflex disease.  He was initially treated with PTCA but subsequently required drug-eluting stent placement to in-stent restenosis.  In late July, he underwent a third catheterization due to ongoing symptoms revealing patency of previously placed left main and circumflex stents.  He has a patent LIMA to the LAD with a known chronic occlusion of the vein graft to the distal circumflex.  The RCA is known to be nondominant and moderately diseased.  He says that following his July 2019 interventions, he has done well.  Last echo in August 2019 showed an EF of 20 to 25%.  He is not particularly active but is able to carry out routine ADLs and household chores without significant symptoms or limitations.  He does eat out for 2 meals a day and is not particular careful with his sodium intake.  That said, he does not typically experience lower extremity swelling or increase in abdominal girth.  He was in his usual state of health until the early evening of December 9, when around 5 PM, he had lifted a heavy toolbox and noted pain between his shoulder blades.  He felt may be a pulled muscle.  Initially put ice on  it and then subsequently heat without any improvement in symptoms.  He then began to experience shortness of breath and so he went and got his oxygen (typically only uses this with CPAP at night).  Unfortunately, despite applying oxygen, his dyspnea worsened and he began to experience disorientation.  His wife called EMS and upon arrival, he was found to be hypoxic with saturations in the 70s to 80s.  Patient says he does not recall much about his trip to the ER because he was struggling for breath and was somewhat disoriented.  Upon arrival to the ER, he was placed on BiPAP with significant improvement in respiratory status.  BP on arrival was 156/98.  Initial  ECG shows a wide-complex tachycardia at a rate of 109 with a left bundle branch block.  Following oxygenation and diuresis, follow-up ECG shows sinus rhythm at 76 with a left axis and IVCD.  CTA of the chest was negative for dissection or PE.  CHF was noted.  Initial troponin was 39 but then elevated to 2747.  He has been treated with intravenous Lasix and though he remains on BiPAP, is feeling significantly better.  He denies any back pain at this time.  Inpatient Medications    . amiodarone  200 mg Oral Daily  . aspirin EC  81 mg Oral Daily  . carvedilol  6.25 mg Oral BID WC  . cholecalciferol  2,000 Units Oral BID  . furosemide  80 mg Intravenous BID  . insulin aspart  0-5 Units Subcutaneous QHS  . insulin aspart  6 Units Subcutaneous TID WC  . insulin aspart protamine- aspart  20 Units Subcutaneous BID WC  . isosorbide mononitrate  30 mg Oral Daily  . nitroGLYCERIN  1 inch Topical Q6H  . potassium chloride SA  20 mEq Oral BID  . rosuvastatin  40 mg Oral QHS  . sacubitril-valsartan  1 tablet Oral BID  . sertraline  50 mg Oral QHS  . sodium chloride flush  3 mL Intravenous Q12H  . ticagrelor  90 mg Oral BID    Family History    Family History  Problem Relation Age of Onset  . Uterine cancer Mother   . Lung cancer Mother   . Hyperlipidemia Father   . Heart disease Father   . Hypertension Father   . Diabetes Father   . Colon cancer Neg Hx   . Esophageal cancer Neg Hx   . Colon polyps Neg Hx   . Rectal cancer Neg Hx   . Stomach cancer Neg Hx   . Inflammatory bowel disease Neg Hx   . Liver disease Neg Hx   . Pancreatic cancer Neg Hx    He indicated that the status of his mother is unknown. He indicated that the status of his father is unknown. He indicated that the status of his neg hx is unknown.   Social History    Social History   Socioeconomic History  . Marital status: Married    Spouse name: Not on file  . Number of children: Not on file  . Years of education:  Not on file  . Highest education level: Not on file  Occupational History  . Not on file  Tobacco Use  . Smoking status: Former Smoker    Years: 27.00    Types: Cigars    Quit date: 2002    Years since quitting: 18.9  . Smokeless tobacco: Former Systems developer    Quit date: 2002  Substance and Sexual Activity  .  Alcohol use: Not Currently  . Drug use: Never  . Sexual activity: Not Currently  Other Topics Concern  . Not on file  Social History Narrative  . Not on file   Social Determinants of Health   Financial Resource Strain: Low Risk   . Difficulty of Paying Living Expenses: Not hard at all  Food Insecurity: No Food Insecurity  . Worried About Charity fundraiser in the Last Year: Never true  . Ran Out of Food in the Last Year: Never true  Transportation Needs: No Transportation Needs  . Lack of Transportation (Medical): No  . Lack of Transportation (Non-Medical): No  Physical Activity: Unknown  . Days of Exercise per Week: 0 days  . Minutes of Exercise per Session: Not on file  Stress: No Stress Concern Present  . Feeling of Stress : Not at all  Social Connections: Not Isolated  . Frequency of Communication with Friends and Family: Twice a week  . Frequency of Social Gatherings with Friends and Family: Twice a week  . Attends Religious Services: More than 4 times per year  . Active Member of Clubs or Organizations: Yes  . Attends Archivist Meetings: Never  . Marital Status: Married  Human resources officer Violence: Not At Risk  . Fear of Current or Ex-Partner: No  . Emotionally Abused: No  . Physically Abused: No  . Sexually Abused: No     Review of Systems    General:  No chills, fever, night sweats or weight changes.  Cardiovascular:  +++ midscapular back pain and acute/progressive dyspnea last night.  No chest pain, edema, orthopnea, palpitations, paroxysmal nocturnal dyspnea. Dermatological: No rash, lesions/masses Respiratory: No cough, +++ dyspnea Urologic:  No hematuria, dysuria Abdominal:   No nausea, vomiting, diarrhea, bright red blood per rectum, melena, or hematemesis Neurologic: Was disoriented in the setting of hypoxia.  This is resolved.  No visual changes, wkns All other systems reviewed and are otherwise negative except as noted above.  Physical Exam    Blood pressure 119/81, pulse 69, temperature 97.6 F (36.4 C), temperature source Oral, resp. rate 16, height 5\' 7"  (1.702 m), weight 98 kg, SpO2 94 %.  General: Pleasant, NAD Psych: Normal affect. Neuro: Alert and oriented X 3. Moves all extremities spontaneously. HEENT: Normal  Neck: Supple obese, difficult to gauge JVP.  No bruits. Lungs:  Resp regular and unlabored, bibasilar crackles. Heart: RRR no s3, s4, or murmurs.  Abdomen: Obese, semifirm, nontender, nondistended, BS + x 4.  Extremities: No clubbing, cyanosis or edema. DP/PT/Radials 2+ and equal bilaterally.  Labs    Cardiac Enzymes Recent Labs  Lab 01/03/19 2132 01/04/19 0044  TROPONINIHS 39* 2,747*      Lab Results  Component Value Date   WBC 9.3 01/04/2019   HGB 13.3 01/04/2019   HCT 40.0 01/04/2019   MCV 91.3 01/04/2019   PLT 185 01/04/2019    Recent Labs  Lab 01/03/19 2132 01/04/19 0640  NA 134* 136  K 3.9 4.4  CL 101 101  CO2 20* 25  BUN 20 23  CREATININE 1.54* 1.41*  CALCIUM 9.1 8.9  PROT 8.0  --   BILITOT 0.6  --   ALKPHOS 94  --   ALT 15  --   AST 21  --   GLUCOSE 573* 370*   Lab Results  Component Value Date   CHOL 142 01/04/2019   HDL 45 01/04/2019   LDLCALC 67 01/04/2019   TRIG 148 01/04/2019  Radiology Studies    CT ANGIO CHEST PE W OR WO CONTRAST  Result Date: 01/04/2019 CLINICAL DATA:  PE suspected, intermediate prob, positive D-dimer. Respiratory distress. Hypoxia. EXAM: CT ANGIOGRAPHY CHEST WITH CONTRAST TECHNIQUE: Multidetector CT imaging of the chest was performed using the standard protocol during bolus administration of intravenous contrast. Multiplanar CT  image reconstructions and MIPs were obtained to evaluate the vascular anatomy. CONTRAST:  161mL OMNIPAQUE IOHEXOL 350 MG/ML SOLN COMPARISON:  Chest radiograph earlier this day. No prior chest CT. FINDINGS: Cardiovascular: There are no filling defects within the pulmonary arteries to suggest pulmonary embolus. Moderate multi chamber cardiomegaly. Left-sided pacemaker with leads in the right atrium and ventricle. Post CABG. Atherosclerosis of the thoracic aorta. Cannot assess for dissection given phase of contrast tailored to pulmonary artery evaluation. There is no pericardial effusion. Mediastinum/Nodes: Shotty mediastinal lymph nodes with 9 mm right lower paratracheal node. There is a 12 mm right hilar node. No visualized thyroid nodule. No esophageal thickening. Lungs/Pleura: Small right and trace left pleural effusion. Adjacent atelectasis in the right lower lobe. Smooth septal thickening with patchy ground-glass opacities within both lungs, slightly more prominent on the right. There is fluid within fissures bilaterally. Mild lower lobe bronchial thickening is likely congestive. No evidence of pulmonary mass. Trachea and mainstem bronchi are patent. Upper Abdomen: Fluid distended stomach without gastric wall thickening. Musculoskeletal: Post median sternotomy. Degenerative change in the spine. There are no acute or suspicious osseous abnormalities. Review of the MIP images confirms the above findings. IMPRESSION: 1. No pulmonary embolus. 2. CHF. Cardiomegaly with small bilateral pleural effusions and pulmonary edema. Ground-glass opacities in both lungs are most consistent with pulmonary edema, an element of superimposed atypical viral pneumonia cannot be excluded on imaging findings alone, but is felt less likely. 3. Shotty mediastinal and right hilar lymph nodes are likely reactive. Aortic Atherosclerosis (ICD10-I70.0). Electronically Signed   By: Keith Rake M.D.   On: 01/04/2019 00:07   DG Chest  Portable 1 View  Result Date: 01/03/2019 CLINICAL DATA:  Acute respiratory distress, O2 sat 70-80% upon EMS arrival EXAM: PORTABLE CHEST 1 VIEW COMPARISON:  Radiograph 08/20/2017 FINDINGS: Postsurgical changes related to prior CABG including intact and aligned sternotomy wires and multiple surgical clips projecting over the mediastinum. Coronary stent overlies the cardiac silhouette. Three lead pacer/defibrillator pack overlies the left chest wall with leads at the apex, right atrium, and coronary sinus. Telemetry leads overlie the chest. Diffuse interstitial and airspace opacities throughout the lungs with some fluid thickening the right minor fissure. There is marked cardiomegaly, increased from comparison. No visible pneumothorax or evidence of left pleural fluid. No acute osseous or soft tissue abnormality. IMPRESSION: Marked cardiomegaly with diffuse interstitial and airspace opacities throughout the lungs. The findings are most compatible with moderate to severe CHF/volume overload. Please note, infection or inflammation could present with similar findings. Electronically Signed   By: Lovena Le M.D.   On: 01/03/2019 22:14    ECG & Cardiac Imaging    Initial ECG showed a wide-complex tachycardia a rate of 109 with a left bundle branch block.  Follow-up showed sinus rhythm at 76, left axis deviation and IVCD- personally reviewed.  Assessment & Plan    1.  Acute pulmonary edema/ischemic cardiomyopathy/acute on chronic combined systolic and diastolic congestive heart failure: Patient with an EF of 20 to 25% by most recent echo in August 2019.  He is status post ICD in the setting of his cardiomyopathy.  He had abrupt onset of dyspnea in  the setting of back pain last night which was recalcitrant to supplemental oxygen.  Patient was hypoxic in the 70s upon EMS arrival.  He had significant improvement in respiratory status and mentation with BiPAP.  CT of the chest was negative for PE but did show CHF.   He has been treated with intravenous Lasix and is -2.3 L thus far.  Volume status somewhat difficult to gauge due to body habitus however, his proBNP was elevated at 1262.  He continues to have bibasilar crackles.  Of note, he did not think that he had been retaining fluid recently, though notes that he eats out at restaurants for at least 2 meals a day.  In the setting of the above, initial troponin was minimally elevated at 39 but then rose to 2747.  Unclear at this point if this is an ischemic event leading to acute pulmonary edema or acute pulmonary edema and worsening heart failure in the setting of dietary noncompliance with demand ischemia.  Recommend continuing to trend troponin and we will need to strongly consider diagnostic catheterization following adequate diuresis.  Continue beta-blocker, Entresto, nitrate, and Lasix 80 mg IV twice daily.  2.  Coronary artery disease/non-STEMI: In the setting of above, troponin elevated to 2747.  He had back pain at the onset of symptoms last night followed by dyspnea.  Back pain had never been an anginal equivalent for him before.  Continue to cycle enzymes as outlined above.  Continue aspirin, statin, beta-blocker, nitrate, and Brilinta.  Follow clinical progress with diuresis as well as the cycle troponins to determine timeliness of ischemic evaluation and probable cath.  3.  Essential hypertension: Blood pressure is elevated in the setting of above.  Continue home meds and titrate as necessary.  4.  Hyperlipidemia: Continue statin therapy.  LDL at goal at 67.  5.  Poorly controlled type 2 diabetes mellitus: A1c 11.6.  Management per internal medicine.  6.  Wide-complex tachycardia: Initial ECG showed a wide-complex tachycardia at a rate of 109 with a left bundle branch block morphology.  He is on amiodarone and is status post ICD placement.  We will ask Medtronic to interrogate his device if not already done.   Signed, Murray Hodgkins,  NP 01/04/2019, 11:12 AM  For questions or updates, please contact   Please consult [email or web address] for contact info under Cardiology/STEMI.

## 2019-01-04 NOTE — Progress Notes (Signed)
*  PRELIMINARY RESULTS* Echocardiogram 2D Echocardiogram has been performed.  Sherrie Sport 01/04/2019, 11:47 AM

## 2019-01-04 NOTE — Progress Notes (Signed)
ANTICOAGULATION CONSULT NOTE - Initial Consult  Pharmacy Consult for Heparin Indication: chest pain/ACS  Allergies  Allergen Reactions  . Diltiazem Rash    Patient Measurements: Height: 5\' 7"  (170.2 cm) Weight: 216 lb (98 kg) IBW/kg (Calculated) : 66.1 HEPARIN DW (KG): 87.2  Vital Signs: Temp: 97.6 F (36.4 C) (12/09 2200) Temp Source: Oral (12/09 2200) BP: 140/84 (12/10 0030) Pulse Rate: 84 (12/10 0030)  Labs: Recent Labs    01/03/19 2132 01/04/19 0044  HGB 15.2  --   HCT 47.5  --   PLT 312  --   LABPROT 13.1  --   INR 1.0  --   CREATININE 1.54*  --   TROPONINIHS 39* 2,747*    Estimated Creatinine Clearance: 54.8 mL/min (A) (by C-G formula based on SCr of 1.54 mg/dL (H)).  Medical History: Past Medical History:  Diagnosis Date  . AICD (automatic cardioverter/defibrillator) present    Medtronic  . Arthritis    "thumbs"  (08/02/2017)  . CHF (congestive heart failure) ([place])   . Chronic combined systolic and diastolic heart failure ([place]) 09/27/2018  . Colon polyps   . Coronary artery disease   . Depression   . Encounter for assessment of implantable cardioverter-defibrillator (ICD) 09/27/2018  . High cholesterol   . Hypertension   . ICD; Biventricular  Medtronic ICD Amplia MRI QWuad CRT-D  in situ 10/29/14 10/29/2014   Remote ICD check 09.23.20:  One 6 beat NSVT. No therapy.  1 SVT episode @ 130 bpm (38 Sec).  There were 23 Vent sense episodes detected for up to 1.1 min/day (AT). Health trends (patient activity, heart rate variability, average heart rates) are stable.Trans-thoracic impedance trends and the OptiVol Fluid Index do no present significant abnormalities. Battery longevity is 4.3 years. RA pacing is 3  . Ischemic cardiomyopathy 09/27/2018  . MI (myocardial infarction) ([place]) 2003  . NSTEMI (non-ST elevated myocardial infarction) ([place]) 07/16/2014  . OSA on CPAP   . Oxygen deficiency   . Pneumonia 10/2014  . Proteinuria   . Sleep apnea   . Type II diabetes  mellitus (HCC)    insulin dependent    Medications:  (Not in a hospital admission)   Assessment: Pharmacy asked to initiate and monitor Heparin for ACS.  Heparin was initiated during computer downtime.  Pt not on anticoagulants per med list.   Goal of Therapy:  Heparin level 0.3-0.7 units/ml Monitor platelets by anticoagulation protocol: Yes   Plan:  Heparin 4000 units bolus then infusion at 1000 units/hr Check HL in 6 hours  Hart Robinsons A 01/04/2019,2:54 AM

## 2019-01-04 NOTE — Progress Notes (Signed)
PROGRESS NOTE    Martin Bush  [id number] DOB: 04-Dec-1955 DOA: 01/03/2019 PCP: [name], MD    Brief Narrative:  Martin Bush is a 63 y.o. 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  O2 sat recorded by EMS was in the 29s.Showed EKG showed no acute ST-T wave changes.  NP was elevated at 1200 and initial troponin was elevated at 39.  Cell count was elevated at 15,000.  Peak troponin after 2 hours is 2700.CTA chest showed no PE but did show bilateral pleural effusions and pulmonary edema. Cardiology was consulted.  Consultants:   Cardiology  Procedures: CT  Antimicrobials:   None   Subjective: Patient states shortness of breath is improving but not at baseline yet.  No chest pain.  No other complaints  Objective: Vitals:   01/04/19 1230 01/04/19 1300 01/04/19 1330 01/04/19 1400  BP: 108/84 105/65 98/64 95/65   Pulse: 74 73 66 63  Resp: 17 19 13 11   Temp:      TempSrc:      SpO2: 98% 96% 98% 98%  Weight:      Height:        Intake/Output Summary (Last 24 hours) at 01/04/2019 1501 Last data filed at 01/04/2019 1253 Gross per 24 hour  Intake --  Output 2900 ml  Net -2900 ml   Filed Weights   01/03/19 2202  Weight: 98 kg    Examination:  General exam: Appears calm and comfortable  Respiratory system: minimal scattered rales, no wheezing. Cardiovascular system: S1 & S2 heard, RRR. No m/r/c Gastrointestinal system: Abdomen is nondistended, soft and nontender. No organomegaly or masses felt. Normal bowel sounds heard. Central nervous system: Alert and oriented. No focal neurological  deficits. Extremities:minimal edema. Psychiatry: Judgement and insight appear normal. Mood & affect appropriate.     Data Reviewed: I have personally reviewed following labs and imaging studies  CBC: Recent Labs  Lab 01/03/19 2132 01/04/19 0640  WBC 15.8* 9.3  NEUTROABS 9.5*  --   HGB 15.2 13.3  HCT 47.5 40.0  MCV 95.8 91.3  PLT 312 [id number]   Basic Metabolic Panel: Recent Labs  Lab 01/03/19 2132 01/04/19 0640  NA 134* 136  K 3.9 4.4  CL 101 101  CO2 20* 25  GLUCOSE 573* 370*  BUN 20 23  CREATININE 1.54* 1.41*  CALCIUM 9.1 8.9   GFR: Estimated Creatinine Clearance: 59.8 mL/min (A) (by C-G formula based on SCr of 1.41 mg/dL (H)). Liver Function Tests: Recent Labs  Lab 01/03/19 2132  AST 21  ALT 15  ALKPHOS 94  BILITOT 0.6  PROT 8.0  ALBUMIN 4.3   No results for input(s): LIPASE, AMYLASE in the last 168 hours. No results for input(s): AMMONIA in the last 168 hours. Coagulation Profile: Recent Labs  Lab 01/03/19 2132  INR 1.0   Cardiac Enzymes: No results for input(s): CKTOTAL, CKMB, CKMBINDEX, TROPONINI in the last 168 hours. BNP (last 3 results) No results for input(s): PROBNP in the last 8760 hours. HbA1C: No results for input(s): HGBA1C in the last 72 hours. CBG: Recent Labs  Lab 01/04/19 1028 01/04/19 1249  GLUCAP 254* 212*   Lipid  Profile: Recent Labs    01/04/19 0640  CHOL 142  HDL 45  LDLCALC 67  TRIG 148  CHOLHDL 3.2   Thyroid Function Tests: No results for input(s): TSH, T4TOTAL, FREET4, T3FREE, THYROIDAB in the last 72 hours. Anemia Panel: No results for input(s): VITAMINB12, FOLATE, FERRITIN, TIBC, IRON, RETICCTPCT in the last 72 hours. Sepsis Labs: Recent Labs  Lab 01/04/19 0044  PROCALCITON <0.10    Recent Results (from the past 240 hour(s))  Blood culture (routine x 2)     Status: None (Preliminary result)   Collection Time: 01/03/19  9:32 PM   Specimen: BLOOD  Result Value Ref Range Status   Specimen Description  BLOOD BLOOD LEFT HAND  Final   Special Requests   Final    BOTTLES DRAWN AEROBIC AND ANAEROBIC Blood Culture adequate volume   Culture   Final    NO GROWTH < 12 HOURS Performed at [facility], [address] [place], [place] [address]    Report Status PENDING  Incomplete  Blood culture (routine x 2)     Status: None (Preliminary result)   Collection Time: 01/03/19  9:32 PM   Specimen: BLOOD  Result Value Ref Range Status   Specimen Description BLOOD BLOOD LEFT HAND  Final   Special Requests   Final    BOTTLES DRAWN AEROBIC AND ANAEROBIC Blood Culture results may not be optimal due to an excessive volume of blood received in culture bottles   Culture   Final    NO GROWTH < 12 HOURS Performed at [facility], [address], [place], [place] [address]    Report Status PENDING  Incomplete  Respiratory Panel by RT PCR (Flu A&B, Covid) - Nasopharyngeal Swab     Status: None   Collection Time: 01/03/19 10:49 PM   Specimen: Nasopharyngeal Swab  Result Value Ref Range Status   SARS Coronavirus 2 by RT PCR NEGATIVE NEGATIVE Final    Comment: (NOTE) SARS-CoV-2 target nucleic acids are NOT DETECTED. The SARS-CoV-2 RNA is generally detectable in upper respiratoy specimens during the acute phase of infection. The lowest concentration of SARS-CoV-2 viral copies this assay can detect is 131 copies/mL. A negative result does not preclude SARS-Cov-2 infection and should not be used as the sole basis for treatment or other patient management decisions. A negative result may occur with  improper specimen collection/handling, submission of specimen other than nasopharyngeal swab, presence of viral mutation(s) within the areas targeted by this assay, and inadequate number of viral copies (<131 copies/mL). A negative result must be combined with clinical observations, patient history, and epidemiological information. The expected result is Negative. Fact Sheet for Patients:   PinkCheek.be Fact Sheet for Healthcare Providers:  GravelBags.it This test is not yet ap proved or cleared by the [place] FDA and  has been authorized for detection and/or diagnosis of SARS-CoV-2 by FDA under an Emergency Use Authorization (EUA). This EUA will remain  in effect (meaning this test can be used) for the duration of the COVID-19 declaration under Section 564(b)(1) of the Act, 21 U.S.C. section 360bbb-3(b)(1), unless the authorization is terminated or revoked sooner.    Influenza A by PCR NEGATIVE NEGATIVE Final   Influenza B by PCR NEGATIVE NEGATIVE Final    Comment: (NOTE) The Xpert Xpress SARS-CoV-2/FLU/RSV assay is intended as an aid in  the diagnosis of influenza from Nasopharyngeal swab specimens and  should not be used as a sole basis for treatment. Nasal washings and  aspirates are  unacceptable for Xpert Xpress SARS-CoV-2/FLU/RSV  testing. Fact Sheet for Patients: PinkCheek.be Fact Sheet for Healthcare Providers: GravelBags.it This test is not yet approved or cleared by the [place] FDA and  has been authorized for detection and/or diagnosis of SARS-CoV-2 by  FDA under an Emergency Use Authorization (EUA). This EUA will remain  in effect (meaning this test can be used) for the duration of the  Covid-19 declaration under Section 564(b)(1) of the Act, 21  U.S.C. section 360bbb-3(b)(1), unless the authorization is  terminated or revoked. Performed at [facility], [address], [place], [place] [address]          Radiology Studies: CT ANGIO CHEST PE W OR WO CONTRAST  Result Date: 01/04/2019 CLINICAL DATA:  PE suspected, intermediate prob, positive D-dimer. Respiratory distress. Hypoxia. EXAM: CT ANGIOGRAPHY CHEST WITH CONTRAST TECHNIQUE: Multidetector CT imaging of the chest was performed using the standard protocol  during bolus administration of intravenous contrast. Multiplanar CT image reconstructions and MIPs were obtained to evaluate the vascular anatomy. CONTRAST:  117mL OMNIPAQUE IOHEXOL 350 MG/ML SOLN COMPARISON:  Chest radiograph earlier this day. No prior chest CT. FINDINGS: Cardiovascular: There are no filling defects within the pulmonary arteries to suggest pulmonary embolus. Moderate multi chamber cardiomegaly. Left-sided pacemaker with leads in the right atrium and ventricle. Post CABG. Atherosclerosis of the thoracic aorta. Cannot assess for dissection given phase of contrast tailored to pulmonary artery evaluation. There is no pericardial effusion. Mediastinum/Nodes: Shotty mediastinal lymph nodes with 9 mm right lower paratracheal node. There is a 12 mm right hilar node. No visualized thyroid nodule. No esophageal thickening. Lungs/Pleura: Small right and trace left pleural effusion. Adjacent atelectasis in the right lower lobe. Smooth septal thickening with patchy ground-glass opacities within both lungs, slightly more prominent on the right. There is fluid within fissures bilaterally. Mild lower lobe bronchial thickening is likely congestive. No evidence of pulmonary mass. Trachea and mainstem bronchi are patent. Upper Abdomen: Fluid distended stomach without gastric wall thickening. Musculoskeletal: Post median sternotomy. Degenerative change in the spine. There are no acute or suspicious osseous abnormalities. Review of the MIP images confirms the above findings. IMPRESSION: 1. No pulmonary embolus. 2. CHF. Cardiomegaly with small bilateral pleural effusions and pulmonary edema. Ground-glass opacities in both lungs are most consistent with pulmonary edema, an element of superimposed atypical viral pneumonia cannot be excluded on imaging findings alone, but is felt less likely. 3. Shotty mediastinal and right hilar lymph nodes are likely reactive. Aortic Atherosclerosis (ICD10-I70.0). Electronically Signed    By: Keith Rake M.D.   On: 01/04/2019 00:07   DG Chest Portable 1 View  Result Date: 01/03/2019 CLINICAL DATA:  Acute respiratory distress, O2 sat 70-80% upon EMS arrival EXAM: PORTABLE CHEST 1 VIEW COMPARISON:  Radiograph 08/20/2017 FINDINGS: Postsurgical changes related to prior CABG including intact and aligned sternotomy wires and multiple surgical clips projecting over the mediastinum. Coronary stent overlies the cardiac silhouette. Three lead pacer/defibrillator pack overlies the left chest wall with leads at the apex, right atrium, and coronary sinus. Telemetry leads overlie the chest. Diffuse interstitial and airspace opacities throughout the lungs with some fluid thickening the right minor fissure. There is marked cardiomegaly, increased from comparison. No visible pneumothorax or evidence of left pleural fluid. No acute osseous or soft tissue abnormality. IMPRESSION: Marked cardiomegaly with diffuse interstitial and airspace opacities throughout the lungs. The findings are most compatible with moderate to severe CHF/volume overload. Please note, infection or inflammation could present with similar findings. Electronically Signed  By: Lovena Le M.D.   On: 01/03/2019 22:14        Scheduled Meds: . amiodarone  200 mg Oral Daily  . aspirin EC  81 mg Oral Daily  . carvedilol  6.25 mg Oral BID WC  . cholecalciferol  2,000 Units Oral BID  . furosemide  80 mg Intravenous BID  . heparin  1,300 Units Intravenous Once  . insulin aspart  0-5 Units Subcutaneous QHS  . insulin aspart  6 Units Subcutaneous TID WC  . insulin aspart protamine- aspart  20 Units Subcutaneous BID WC  . isosorbide mononitrate  30 mg Oral Daily  . nitroGLYCERIN  1 inch Topical Q6H  . potassium chloride SA  20 mEq Oral BID  . rosuvastatin  40 mg Oral QHS  . sacubitril-valsartan  1 tablet Oral BID  . sertraline  50 mg Oral QHS  . sodium chloride flush  3 mL Intravenous Q12H  . ticagrelor  90 mg Oral BID    Continuous Infusions: . sodium chloride    . heparin 1,150 Units/hr (01/04/19 1243)    Assessment & Plan:   Principal Problem:   NSTEMI (non-ST elevated myocardial infarction) ([place]) Active Problems:   Insulin dependent type 2 diabetes mellitus ([place])   HTN (hypertension), benign   CKD (chronic kidney disease) stage 3, GFR 30-59 ml/min   Acute on chronic combined systolic and diastolic CHF (congestive heart failure) ([facility])   Hx of CABG   Acute respiratory failure ([place])   1.  Acute on chronic combined systolic diastolic CHF Patient does report having eaten salty foods dietary noncompliance likely is attributing. Covid negative. CT negative for PE Cardiology saw patient started on IV Lasix will continue Monitor labs and electrolytes   2.  NSTEMI- Troponin elevated, possibly due to demand ischemia Continue with aspirin statin beta-blockers nitro Brilinta Cardiology following On heparin drip   3.  Hypertension Continue home meds  4.  Dyslipidemia Continue statin  5.WCT- on initial EKG found with wide-complex tachycardia at a rate of 109 with left bundle branch block morphology. On amiodarone Status post ICD placement [employer] was called by cardiology to interrogate   6. DM2- Uncontrolled Last A1c 11.2 Carb controlled diet Insulin R ISS DVT prophylaxis: Heparin Code Status: Full Family Communication: None at bedside Disposition Plan: Likely be here 2 more midnight days       LOS: 0 days   Time spent: 45 minutes with more than 50% COC    Nolberto Hanlon, MD [facility] Pager [phone]  If 7PM-7AM, please contact night-coverage [email or web address] Password [email or web address] 01/04/2019, 3:01 PM

## 2019-01-04 NOTE — ED Notes (Signed)
Date and time results received: 01/04/19 1655 (use smartphrase ".now" to insert current time)  Test: Troponin Critical Value: 11,045  Name of Provider Notified: Kurtis Bushman, MD via secure chat  Orders Received? Or Actions Taken?: No orders received

## 2019-01-04 NOTE — ED Notes (Signed)
Date and time results received: 01/04/19 01:30   Test: Troponin  Critical Value: 2,747  Name of Provider Notified: Dr. Jari Pigg & NP, Stark Klein

## 2019-01-04 NOTE — ED Notes (Signed)
CBG 254

## 2019-01-05 LAB — CBC
HCT: 38.3 % — ABNORMAL LOW (ref 39.0–52.0)
Hemoglobin: 12.6 g/dL — ABNORMAL LOW (ref 13.0–17.0)
MCH: 30.4 pg (ref 26.0–34.0)
MCHC: 32.9 g/dL (ref 30.0–36.0)
MCV: 92.5 fL (ref 80.0–100.0)
Platelets: 190 10*3/uL (ref 150–400)
RBC: 4.14 MIL/uL — ABNORMAL LOW (ref 4.22–5.81)
RDW: 12.7 % (ref 11.5–15.5)
WBC: 9.1 10*3/uL (ref 4.0–10.5)
nRBC: 0 % (ref 0.0–0.2)

## 2019-01-05 LAB — BASIC METABOLIC PANEL
Anion gap: 12 (ref 5–15)
BUN: 34 mg/dL — ABNORMAL HIGH (ref 8–23)
CO2: 24 mmol/L (ref 22–32)
Calcium: 8.4 mg/dL — ABNORMAL LOW (ref 8.9–10.3)
Chloride: 98 mmol/L (ref 98–111)
Creatinine, Ser: 1.75 mg/dL — ABNORMAL HIGH (ref 0.61–1.24)
GFR calc Af Amer: 47 mL/min — ABNORMAL LOW (ref >60)
GFR calc non Af Amer: 41 mL/min — ABNORMAL LOW (ref >60)
Glucose, Bld: 147 mg/dL — ABNORMAL HIGH (ref 70–99)
Potassium: 3.5 mmol/L (ref 3.5–5.1)
Sodium: 134 mmol/L — ABNORMAL LOW (ref 135–145)

## 2019-01-05 LAB — HEPARIN LEVEL (UNFRACTIONATED)
Heparin Unfractionated: 0.25 IU/mL — ABNORMAL LOW (ref 0.30–0.70)
Heparin Unfractionated: 0.34 IU/mL (ref 0.30–0.70)
Heparin Unfractionated: 0.43 IU/mL (ref 0.30–0.70)

## 2019-01-05 LAB — GLUCOSE, CAPILLARY
Glucose-Capillary: 150 mg/dL — ABNORMAL HIGH (ref 70–99)
Glucose-Capillary: 314 mg/dL — ABNORMAL HIGH (ref 70–99)
Glucose-Capillary: 249 mg/dL — ABNORMAL HIGH (ref 70–99)
Glucose-Capillary: 220 mg/dL — ABNORMAL HIGH (ref 70–99)

## 2019-01-05 MED ORDER — EZETIMIBE 10 MG PO TABS
10.0000 mg | ORAL_TABLET | Freq: Every day | ORAL | Status: DC
  Administered 2019-01-05 – 2019-01-07 (×3): 10 mg via ORAL
  Filled 2019-01-05 (×4): qty 1

## 2019-01-05 MED ORDER — INSULIN ASPART 100 UNIT/ML ~~LOC~~ SOLN
0.0000 [IU] | Freq: Three times a day (TID) | SUBCUTANEOUS | Status: DC
  Administered 2019-01-05: 11 [IU] via SUBCUTANEOUS
  Administered 2019-01-05: 5 [IU] via SUBCUTANEOUS
  Administered 2019-01-05: 2 [IU] via SUBCUTANEOUS
  Administered 2019-01-06: 5 [IU] via SUBCUTANEOUS
  Administered 2019-01-06: 8 [IU] via SUBCUTANEOUS
  Administered 2019-01-06 – 2019-01-07 (×2): 5 [IU] via SUBCUTANEOUS
  Administered 2019-01-07: 3 [IU] via SUBCUTANEOUS
  Administered 2019-01-07: 5 [IU] via SUBCUTANEOUS
  Administered 2019-01-08 (×2): 3 [IU] via SUBCUTANEOUS
  Filled 2019-01-05 (×11): qty 1

## 2019-01-05 NOTE — Progress Notes (Signed)
Patient ID: Martin Bush, male   DOB: 09/18/1955, 63 y.o.   MRN: [id number]  PROGRESS NOTE    Martin Bush  [id number] DOB: [birth year] DOA: 01/03/2019 PCP: [name], MD    Brief Narrative:  Martin Bush is a 63 y.o. 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  O2 sat recorded by EMS was in the 78s.Showed EKG showed no acute ST-T wave changes.  NP was elevated at 1200 and initial troponin was elevated at 39.  Cell count was elevated at 15,000.  Peak troponin after 2 hours is 2700.CTA chest showed no PE but did show bilateral pleural effusions and pulmonary edema. Cardiology was consulted.  Consultants:   Cardiology  Procedures: CT, echo  Antimicrobials:   None   Subjective: Reports shortness of breath has improved.  Able to lay flat in bed.  Denies any chest pain this morning.  Objective: Vitals:   01/05/19 1200 01/05/19 1300 01/05/19 1325 01/05/19 1352  BP: (!) 85/59  (!) 94/57 106/61  Pulse: 62 62 62 65  Resp:    20  Temp:    98.1 F (36.7 C)  TempSrc:    Oral  SpO2: 96% 94% 96% 99%  Weight:    98.5 kg  Height:    5\' 7"  (1.702 m)    Intake/Output Summary (Last 24 hours) at 01/05/2019 1436 Last data filed at 01/05/2019 1300 Gross per 24 hour  Intake 637.23 ml  Output 1600 ml  Net -962.77 ml   Filed Weights   01/03/19 2202 01/05/19 0500 01/05/19 1352  Weight: 98 kg 94.9 kg 98.5 kg    Examination:  General exam: Appears calm and comfortable  Respiratory system: More clear to auscultation, no rales rhonchi's or wheezing Cardiovascular system: RRR S1 & S2 heard, No m/r/c Gastrointestinal system:  Abdomen is nondistended, soft and nontender. Normal bowel sounds heard. Central nervous system: Alert and oriented x3. No focal neurological deficits. Extremities: No edema Psychiatry: Judgement and insight appear normal. Mood & affect appropriate.     Data Reviewed: I have personally reviewed following labs and imaging studies  CBC: Recent Labs  Lab 01/03/19 2132 01/04/19 0640 01/05/19 0726  WBC 15.8* 9.3 9.1  NEUTROABS 9.5*  --   --   HGB 15.2 13.3 12.6*  HCT 47.5 40.0 38.3*  MCV 95.8 91.3 92.5  PLT 312 185 [id number]   Basic Metabolic Panel: Recent Labs  Lab 01/03/19 2132 01/04/19 0640 01/05/19 0726  NA 134* 136 134*  K 3.9 4.4 3.5  CL 101 101 98  CO2 20* 25 24  GLUCOSE 573* 370* 147*  BUN 20 23 34*  CREATININE 1.54* 1.41* 1.75*  CALCIUM 9.1 8.9 8.4*   GFR: Estimated Creatinine Clearance: 48.3 mL/min (A) (by C-G formula based on SCr of 1.75 mg/dL (H)). Liver Function Tests: Recent Labs  Lab 01/03/19 2132  AST 21  ALT 15  ALKPHOS 94  BILITOT 0.6  PROT 8.0  ALBUMIN 4.3   No results for input(s): LIPASE, AMYLASE in the last 168 hours. No results for input(s): AMMONIA in the last 168 hours. Coagulation Profile: Recent Labs  Lab 01/03/19 2132  INR 1.0  Cardiac Enzymes: No results for input(s): CKTOTAL, CKMB, CKMBINDEX, TROPONINI in the last 168 hours. BNP (last 3 results) No results for input(s): PROBNP in the last 8760 hours. HbA1C: No results for input(s): HGBA1C in the last 72 hours. CBG: Recent Labs  Lab 01/04/19 1814 01/04/19 1848 01/04/19 2146 01/05/19 0758 01/05/19 1155  GLUCAP 118* 177* 142* 150* 314*   Lipid Profile: Recent Labs    01/04/19 0640  CHOL 142  HDL 45  LDLCALC 67  TRIG 148  CHOLHDL 3.2   Thyroid Function Tests: No results for input(s): TSH, T4TOTAL, FREET4, T3FREE, THYROIDAB in the last 72 hours. Anemia Panel: No results for input(s): VITAMINB12, FOLATE, FERRITIN, TIBC, IRON, RETICCTPCT in the last 72 hours. Sepsis  Labs: Recent Labs  Lab 01/04/19 0044  PROCALCITON <0.10    Recent Results (from the past 240 hour(s))  Blood culture (routine x 2)     Status: None (Preliminary result)   Collection Time: 01/03/19  9:32 PM   Specimen: BLOOD  Result Value Ref Range Status   Specimen Description BLOOD BLOOD LEFT HAND  Final   Special Requests   Final    BOTTLES DRAWN AEROBIC AND ANAEROBIC Blood Culture adequate volume   Culture   Final    NO GROWTH 2 DAYS Performed at [facility], [address], [place], [place] [address]    Report Status PENDING  Incomplete  Blood culture (routine x 2)     Status: None (Preliminary result)   Collection Time: 01/03/19  9:32 PM   Specimen: BLOOD  Result Value Ref Range Status   Specimen Description BLOOD BLOOD LEFT HAND  Final   Special Requests   Final    BOTTLES DRAWN AEROBIC AND ANAEROBIC Blood Culture results may not be optimal due to an excessive volume of blood received in culture bottles   Culture   Final    NO GROWTH 2 DAYS Performed at [facility], [address], [place], [place] [address]    Report Status PENDING  Incomplete  Respiratory Panel by RT PCR (Flu A&B, Covid) - Nasopharyngeal Swab     Status: None   Collection Time: 01/03/19 10:49 PM   Specimen: Nasopharyngeal Swab  Result Value Ref Range Status   SARS Coronavirus 2 by RT PCR NEGATIVE NEGATIVE Final    Comment: (NOTE) SARS-CoV-2 target nucleic acids are NOT DETECTED. The SARS-CoV-2 RNA is generally detectable in upper respiratoy specimens during the acute phase of infection. The lowest concentration of SARS-CoV-2 viral copies this assay can detect is 131 copies/mL. A negative result does not preclude SARS-Cov-2 infection and should not be used as the sole basis for treatment or other patient management decisions. A negative result may occur with  improper specimen collection/handling, submission of specimen other than nasopharyngeal swab, presence of viral  mutation(s) within the areas targeted by this assay, and inadequate number of viral copies (<131 copies/mL). A negative result must be combined with clinical observations, patient history, and epidemiological information. The expected result is Negative. Fact Sheet for Patients:  PinkCheek.be Fact Sheet for Healthcare Providers:  GravelBags.it This test is not yet ap proved or cleared by the [place] FDA and  has been authorized for detection and/or diagnosis of SARS-CoV-2 by FDA under an Emergency Use Authorization (EUA). This EUA will remain  in effect (meaning this test can be used) for the duration of the COVID-19 declaration under Section 564(b)(1) of the Act, 21 U.S.C. section 360bbb-3(b)(1), unless the authorization is terminated or revoked  sooner.    Influenza A by PCR NEGATIVE NEGATIVE Final   Influenza B by PCR NEGATIVE NEGATIVE Final    Comment: (NOTE) The Xpert Xpress SARS-CoV-2/FLU/RSV assay is intended as an aid in  the diagnosis of influenza from Nasopharyngeal swab specimens and  should not be used as a sole basis for treatment. Nasal washings and  aspirates are unacceptable for Xpert Xpress SARS-CoV-2/FLU/RSV  testing. Fact Sheet for Patients: PinkCheek.be Fact Sheet for Healthcare Providers: GravelBags.it This test is not yet approved or cleared by the [place] FDA and  has been authorized for detection and/or diagnosis of SARS-CoV-2 by  FDA under an Emergency Use Authorization (EUA). This EUA will remain  in effect (meaning this test can be used) for the duration of the  Covid-19 declaration under Section 564(b)(1) of the Act, 21  U.S.C. section 360bbb-3(b)(1), unless the authorization is  terminated or revoked. Performed at [facility], [address] [place], [place] [address]   MRSA PCR Screening     Status: None    Collection Time: 01/04/19  7:02 PM   Specimen: Nasal Mucosa; Nasopharyngeal  Result Value Ref Range Status   MRSA by PCR NEGATIVE NEGATIVE Final    Comment:        The GeneXpert MRSA Assay (FDA approved for NASAL specimens only), is one component of a comprehensive MRSA colonization surveillance program. It is not intended to diagnose MRSA infection nor to guide or monitor treatment for MRSA infections. Performed at [facility], [address], [place], [place] [address]          Radiology Studies: CT ANGIO CHEST PE W OR WO CONTRAST  Result Date: 01/04/2019 CLINICAL DATA:  PE suspected, intermediate prob, positive D-dimer. Respiratory distress. Hypoxia. EXAM: CT ANGIOGRAPHY CHEST WITH CONTRAST TECHNIQUE: Multidetector CT imaging of the chest was performed using the standard protocol during bolus administration of intravenous contrast. Multiplanar CT image reconstructions and MIPs were obtained to evaluate the vascular anatomy. CONTRAST:  152mL OMNIPAQUE IOHEXOL 350 MG/ML SOLN COMPARISON:  Chest radiograph earlier this day. No prior chest CT. FINDINGS: Cardiovascular: There are no filling defects within the pulmonary arteries to suggest pulmonary embolus. Moderate multi chamber cardiomegaly. Left-sided pacemaker with leads in the right atrium and ventricle. Post CABG. Atherosclerosis of the thoracic aorta. Cannot assess for dissection given phase of contrast tailored to pulmonary artery evaluation. There is no pericardial effusion. Mediastinum/Nodes: Shotty mediastinal lymph nodes with 9 mm right lower paratracheal node. There is a 12 mm right hilar node. No visualized thyroid nodule. No esophageal thickening. Lungs/Pleura: Small right and trace left pleural effusion. Adjacent atelectasis in the right lower lobe. Smooth septal thickening with patchy ground-glass opacities within both lungs, slightly more prominent on the right. There is fluid within fissures bilaterally. Mild  lower lobe bronchial thickening is likely congestive. No evidence of pulmonary mass. Trachea and mainstem bronchi are patent. Upper Abdomen: Fluid distended stomach without gastric wall thickening. Musculoskeletal: Post median sternotomy. Degenerative change in the spine. There are no acute or suspicious osseous abnormalities. Review of the MIP images confirms the above findings. IMPRESSION: 1. No pulmonary embolus. 2. CHF. Cardiomegaly with small bilateral pleural effusions and pulmonary edema. Ground-glass opacities in both lungs are most consistent with pulmonary edema, an element of superimposed atypical viral pneumonia cannot be excluded on imaging findings alone, but is felt less likely. 3. Shotty mediastinal and right hilar lymph nodes are likely reactive. Aortic Atherosclerosis (ICD10-I70.0). Electronically Signed   By: Aurther Loft.D.  On: 01/04/2019 00:07   DG Chest Portable 1 View  Result Date: 01/03/2019 CLINICAL DATA:  Acute respiratory distress, O2 sat 70-80% upon EMS arrival EXAM: PORTABLE CHEST 1 VIEW COMPARISON:  Radiograph 08/20/2017 FINDINGS: Postsurgical changes related to prior CABG including intact and aligned sternotomy wires and multiple surgical clips projecting over the mediastinum. Coronary stent overlies the cardiac silhouette. Three lead pacer/defibrillator pack overlies the left chest wall with leads at the apex, right atrium, and coronary sinus. Telemetry leads overlie the chest. Diffuse interstitial and airspace opacities throughout the lungs with some fluid thickening the right minor fissure. There is marked cardiomegaly, increased from comparison. No visible pneumothorax or evidence of left pleural fluid. No acute osseous or soft tissue abnormality. IMPRESSION: Marked cardiomegaly with diffuse interstitial and airspace opacities throughout the lungs. The findings are most compatible with moderate to severe CHF/volume overload. Please note, infection or inflammation could  present with similar findings. Electronically Signed   By: Lovena Le M.D.   On: 01/03/2019 22:14   ECHOCARDIOGRAM COMPLETE  Result Date: 01/04/2019   ECHOCARDIOGRAM REPORT   Patient Name:   Martin Bush Date of Exam: 01/04/2019 Medical Rec #:  [id number]     Height:       67.0 in Accession #:    [id number]    Weight:       216.0 lb Date of Birth:  1955/02/10    BSA:          2.09 m Patient Age:    21 years      BP:           119/81 mmHg Patient Gender: M             HR:           69 bpm. Exam Location:  [facility] Procedure: 2D Echo, Cardiac Doppler, Color Doppler and Intracardiac            Opacification Agent Indications:     Acute coronary syndrome 124.9  History:         Patient has prior history of Echocardiogram examinations, most                  recent 09/01/2017. AICD. Ischemic cardiomyopathy, MI, NSTEMI,                  OSA.  Sonographer:     Sherrie Sport RDCS (AE) Referring Phys:  [place] Diagnosing Phys: Ida Rogue MD  Sonographer Comments: Technically difficult study due to poor echo windows. IMPRESSIONS  1. Left ventricular ejection fraction, by visual estimation, is 25 to 30%. The left ventricle has severely decreased function. There is no left ventricular hypertrophy.  2. Definity contrast agent was given IV to delineate the left ventricular endocardial borders.  3. Left ventricular diastolic parameters are consistent with Grade II diastolic dysfunction (pseudonormalization).  4. Mildly dilated left ventricular internal cavity size.  5. The left ventricle demonstrates global hypokinesis.  6. Global right ventricle was not well visualized.The right ventricular size is not well visualized. Right vetricular wall thickness was not assessed.  7. Left atrial size was mildly dilated.  8. TR signal is inadequate for assessing pulmonary artery systolic pressure. FINDINGS  Left Ventricle: Left ventricular ejection fraction, by visual estimation, is 25 to 30%. The left ventricle has severely  decreased function. Definity contrast agent was given IV to delineate the left ventricular endocardial borders. The left ventricle demonstrates global hypokinesis. The left ventricular internal cavity size was mildly  dilated left ventricle. There is no left ventricular hypertrophy. Left ventricular diastolic parameters are consistent with Grade II diastolic dysfunction (pseudonormalization). Normal left atrial pressure. Right Ventricle: The right ventricular size is not well visualized. Right vetricular wall thickness was not assessed. Global RV systolic function is was not well visualized. The tricuspid regurgitant velocity is 1.32 m/s, and with an assumed right atrial  pressure of 10 mmHg, the estimated right ventricular systolic pressure is TR signal is inadequate for assessing PA pressure at 17.0 mmHg. Left Atrium: Left atrial size was mildly dilated. Right Atrium: Right atrial size was normal in size Pericardium: There is no evidence of pericardial effusion. Mitral Valve: The mitral valve is normal in structure. Mild mitral valve regurgitation. No evidence of mitral valve stenosis by observation. Tricuspid Valve: The tricuspid valve is normal in structure. Tricuspid valve regurgitation is not demonstrated. Aortic Valve: The aortic valve was not well visualized. Aortic valve regurgitation is not visualized. Mild to moderate aortic valve sclerosis/calcification is present, without any evidence of aortic stenosis. Aortic valve mean gradient measures 1.5 mmHg.  Aortic valve peak gradient measures 2.7 mmHg. Aortic valve area, by VTI measures 2.97 cm. Pulmonic Valve: The pulmonic valve was normal in structure. Pulmonic valve regurgitation is not visualized. Pulmonic regurgitation is not visualized. Aorta: The aortic root, ascending aorta and aortic arch are all structurally normal, with no evidence of dilitation or obstruction. Venous: The inferior vena cava is normal in size with greater than 50% respiratory  variability, suggesting right atrial pressure of 3 mmHg. IAS/Shunts: No atrial level shunt detected by color flow Doppler. There is no evidence of a patent foramen ovale. No ventricular septal defect is seen or detected. There is no evidence of an atrial septal defect.  LEFT VENTRICLE PLAX 2D LVIDd:         5.60 cm       Diastology LVIDs:         5.01 cm       LV e' lateral:   5.00 cm/s LV PW:         1.10 cm       LV E/e' lateral: 16.5 LV IVS:        1.05 cm       LV e' medial:    4.13 cm/s LVOT diam:     2.20 cm       LV E/e' medial:  20.0 LV SV:         35 ml LV SV Index:   15.93 LVOT Area:     3.80 cm  LV Volumes (MOD) LV area d, A2C:    36.10 cm LV area d, A4C:    43.40 cm LV area s, A2C:    30.80 cm LV area s, A4C:    38.10 cm LV major d, A2C:   7.77 cm LV major d, A4C:   8.55 cm LV major s, A2C:   7.78 cm LV major s, A4C:   8.10 cm LV vol d, MOD A2C: 144.0 ml LV vol d, MOD A4C: 187.0 ml LV vol s, MOD A2C: 107.0 ml LV vol s, MOD A4C: 149.0 ml LV SV MOD A2C:     37.0 ml LV SV MOD A4C:     187.0 ml LV SV MOD BP:      44.5 ml RIGHT VENTRICLE RV Basal diam:  3.73 cm RV S prime:     8.05 cm/s TAPSE (M-mode): 3.8 cm LEFT ATRIUM  Index       RIGHT ATRIUM           Index LA diam:        4.30 cm  2.06 cm/m  RA Area:     22.00 cm LA Vol (A2C):   58.3 ml  27.90 ml/m RA Volume:   62.60 ml  29.96 ml/m LA Vol (A4C):   104.0 ml 49.77 ml/m LA Biplane Vol: 84.9 ml  40.63 ml/m  AORTIC VALVE                   PULMONIC VALVE AV Area (Vmax):    3.17 cm    PV Vmax:        0.54 m/s AV Area (Vmean):   2.68 cm    PV Peak grad:   1.2 mmHg AV Area (VTI):     2.97 cm    RVOT Peak grad: 1 mmHg AV Vmax:           82.50 cm/s AV Vmean:          54.150 cm/s AV VTI:            0.158 m AV Peak Grad:      2.7 mmHg AV Mean Grad:      1.5 mmHg LVOT Vmax:         68.70 cm/s LVOT Vmean:        38.200 cm/s LVOT VTI:          0.124 m LVOT/AV VTI ratio: 0.78  AORTA Ao Root diam: 2.90 cm MITRAL VALVE                         TRICUSPID VALVE MV Area (PHT): 4.10 cm             TR Peak grad:   7.0 mmHg MV PHT:        53.65 msec           TR Vmax:        132.00 cm/s MV Decel Time: 185 msec MV E velocity: 82.50 cm/s 103 cm/s  SHUNTS MV A velocity: 56.80 cm/s 70.3 cm/s Systemic VTI:  0.12 m MV E/A ratio:  1.45       1.5       Systemic Diam: 2.20 cm  Ida Rogue MD Electronically signed by Ida Rogue MD Signature Date/Time: 01/04/2019/6:49:04 PM    Final (Updated)         Scheduled Meds: . amiodarone  400 mg Oral BID  . aspirin EC  81 mg Oral Daily  . carvedilol  6.25 mg Oral BID WC  . Chlorhexidine Gluconate Cloth  6 each Topical Daily  . cholecalciferol  2,000 Units Oral BID  . ezetimibe  10 mg Oral QHS  . insulin aspart  0-15 Units Subcutaneous TID WC  . insulin aspart  0-5 Units Subcutaneous QHS  . insulin aspart protamine- aspart  20 Units Subcutaneous BID WC  . isosorbide mononitrate  30 mg Oral Daily  . nitroGLYCERIN  1 inch Topical Q6H  . potassium chloride SA  20 mEq Oral BID  . rosuvastatin  40 mg Oral QHS  . sertraline  50 mg Oral QHS  . sodium chloride flush  3 mL Intravenous Q12H  . ticagrelor  90 mg Oral BID   Continuous Infusions: . sodium chloride    . heparin 1,300 Units/hr (01/05/19 0308)    Assessment & Plan:   Principal Problem:   NSTEMI (  non-ST elevated myocardial infarction) ([place]) Active Problems:   Insulin dependent type 2 diabetes mellitus (HCC)   HTN (hypertension), benign   CKD (chronic kidney disease) stage 3, GFR 30-59 ml/min   Acute on chronic combined systolic and diastolic CHF (congestive heart failure) (HCC)   Hx of CABG   Acute respiratory failure ([place])   1.  Acute on chronic combined systolic diastolic CHF Patient does report having eaten salty foods dietary noncompliance likely is attributing. Covid negative. CT negative for PE Clinically has improved and more euvolemic Creatinine increased-IV Lasix discontinued We will discontinue Entresto to for  now Echo EF 25 to 30%   2.  NSTEMI- Troponin elevated, possibly due to demand ischemia Continue with aspirin statin beta-blockers nitro [facility] Cardiology following On heparin drip.continue carvedilol and aspirin  plan to hold cath until creatinine improves   3.  Hypertension- Actually with low BP currently after diuresis Hold Entresto due to creatinine increasing Continue carvedilol  4.  Dyslipidemia Continue statin  5.WC/VT- on initial EKG found with wide-complex tachycardia On amiodarone, increased to 400 mg twice daily for reload per cards VT presumably ischemic in etiology Status post ICD placement [employer] was called by cardiology to interrogate due to #1   6. DM2- Uncontrolled Last A1c 11.2 Carb controlled diet Insulin RISS  7.A/CKD III-  Renal function worsening with diuresis. Lasix on hold Monitor closely Hold entresto for now    DVT prophylaxis: Heparin Code Status: Full Family Communication: None at bedside Disposition Plan: Likely be here 2 more midnight days,       LOS: 1 day   Time spent: 45 minutes with more than 50% COC    Nolberto Hanlon, MD [facility] Pager [phone]  If 7PM-7AM, please contact night-coverage [email or web address] Password [facility] 01/05/2019, 2:36 PM

## 2019-01-05 NOTE — Progress Notes (Signed)
Progress Note  Patient Name: Martin Bush Date of Encounter: 01/05/2019  Primary Cardiologist: Christen Butter, MD  Subjective   No c/p or sob overnight.  Lying flat.  No recurrent VT.  Inpatient Medications    Scheduled Meds: . amiodarone  400 mg Oral BID  . aspirin EC  81 mg Oral Daily  . carvedilol  6.25 mg Oral BID WC  . Chlorhexidine Gluconate Cloth  6 each Topical Daily  . cholecalciferol  2,000 Units Oral BID  . ezetimibe  10 mg Oral QHS  . furosemide  80 mg Intravenous BID  . insulin aspart  0-15 Units Subcutaneous TID WC  . insulin aspart  0-5 Units Subcutaneous QHS  . insulin aspart protamine- aspart  20 Units Subcutaneous BID WC  . isosorbide mononitrate  30 mg Oral Daily  . nitroGLYCERIN  1 inch Topical Q6H  . potassium chloride SA  20 mEq Oral BID  . rosuvastatin  40 mg Oral QHS  . sacubitril-valsartan  1 tablet Oral BID  . sertraline  50 mg Oral QHS  . sodium chloride flush  3 mL Intravenous Q12H  . ticagrelor  90 mg Oral BID   Continuous Infusions: . sodium chloride    . heparin 1,300 Units/hr (01/05/19 0308)   PRN Meds: sodium chloride, acetaminophen, metolazone, nitroGLYCERIN, ondansetron (ZOFRAN) IV, sodium chloride flush   Vital Signs    Vitals:   01/05/19 0000 01/05/19 0300 01/05/19 0457 01/05/19 0500  BP: (!) 93/59  93/64   Pulse: 66  62   Resp: 18     Temp:  98 F (36.7 C)    TempSrc:  Oral    SpO2: 93%  95%   Weight:    94.9 kg  Height:        Intake/Output Summary (Last 24 hours) at 01/05/2019 0753 Last data filed at 01/04/2019 2332 Gross per 24 hour  Intake 209.93 ml  Output 2400 ml  Net -2190.07 ml   Filed Weights   01/03/19 2202 01/05/19 0500  Weight: 98 kg 94.9 kg    Physical Exam   GEN: Well nourished, well developed, in no acute distress.  HEENT: Grossly normal.  Neck: Supple, obese, difficult to gauge jvp. No carotid bruits, or masses. Cardiac: RRR, no murmurs, rubs, or gallops. No clubbing, cyanosis, edema.   Radials/DP/PT 2+ and equal bilaterally.  Respiratory:  Respirations regular and unlabored, clear to auscultation bilaterally. GI: Obese, soft, nontender, nondistended, BS + x 4. MS: no deformity or atrophy. Skin: warm and dry, no rash. Neuro:  Strength and sensation are intact. Psych: AAOx3.  Normal affect.  Labs    Chemistry Recent Labs  Lab 01/03/19 2132 01/04/19 0640  NA 134* 136  K 3.9 4.4  CL 101 101  CO2 20* 25  GLUCOSE 573* 370*  BUN 20 23  CREATININE 1.54* 1.41*  CALCIUM 9.1 8.9  PROT 8.0  --   ALBUMIN 4.3  --   AST 21  --   ALT 15  --   ALKPHOS 94  --   BILITOT 0.6  --   GFRNONAA 47* 53*  GFRAA 55* >60  ANIONGAP 13 10     Hematology Recent Labs  Lab 01/03/19 2132 01/04/19 0640 01/05/19 0726  WBC 15.8* 9.3 9.1  RBC 4.96 4.38 4.14*  HGB 15.2 13.3 12.6*  HCT 47.5 40.0 38.3*  MCV 95.8 91.3 92.5  MCH 30.6 30.4 30.4  MCHC 32.0 33.3 32.9  RDW 12.8 12.8 12.7  PLT 312 185 190  Cardiac Enzymes  Recent Labs  Lab 01/03/19 2132 01/04/19 0044 01/04/19 1553 01/04/19 1710  TROPONINIHS 39* 2,747* 11,045* 9,144*      BNP Recent Labs  Lab 01/03/19 2133  BNP 1,262.0*      Radiology    CT ANGIO CHEST PE W OR WO CONTRAST  Result Date: 01/04/2019 CLINICAL DATA:  PE suspected, intermediate prob, positive D-dimer. Respiratory distress. Hypoxia. EXAM: CT ANGIOGRAPHY CHEST WITH CONTRAST TECHNIQUE: Multidetector CT imaging of the chest was performed using the standard protocol during bolus administration of intravenous contrast. Multiplanar CT image reconstructions and MIPs were obtained to evaluate the vascular anatomy. CONTRAST:  162mL OMNIPAQUE IOHEXOL 350 MG/ML SOLN COMPARISON:  Chest radiograph earlier this day. No prior chest CT. FINDINGS: Cardiovascular: There are no filling defects within the pulmonary arteries to suggest pulmonary embolus. Moderate multi chamber cardiomegaly. Left-sided pacemaker with leads in the right atrium and ventricle. Post CABG.  Atherosclerosis of the thoracic aorta. Cannot assess for dissection given phase of contrast tailored to pulmonary artery evaluation. There is no pericardial effusion. Mediastinum/Nodes: Shotty mediastinal lymph nodes with 9 mm right lower paratracheal node. There is a 12 mm right hilar node. No visualized thyroid nodule. No esophageal thickening. Lungs/Pleura: Small right and trace left pleural effusion. Adjacent atelectasis in the right lower lobe. Smooth septal thickening with patchy ground-glass opacities within both lungs, slightly more prominent on the right. There is fluid within fissures bilaterally. Mild lower lobe bronchial thickening is likely congestive. No evidence of pulmonary mass. Trachea and mainstem bronchi are patent. Upper Abdomen: Fluid distended stomach without gastric wall thickening. Musculoskeletal: Post median sternotomy. Degenerative change in the spine. There are no acute or suspicious osseous abnormalities. Review of the MIP images confirms the above findings. IMPRESSION: 1. No pulmonary embolus. 2. CHF. Cardiomegaly with small bilateral pleural effusions and pulmonary edema. Ground-glass opacities in both lungs are most consistent with pulmonary edema, an element of superimposed atypical viral pneumonia cannot be excluded on imaging findings alone, but is felt less likely. 3. Shotty mediastinal and right hilar lymph nodes are likely reactive. Aortic Atherosclerosis (ICD10-I70.0). Electronically Signed   By: Keith Rake M.D.   On: 01/04/2019 00:07   DG Chest Portable 1 View  Result Date: 01/03/2019 CLINICAL DATA:  Acute respiratory distress, O2 sat 70-80% upon EMS arrival EXAM: PORTABLE CHEST 1 VIEW COMPARISON:  Radiograph 08/20/2017 FINDINGS: Postsurgical changes related to prior CABG including intact and aligned sternotomy wires and multiple surgical clips projecting over the mediastinum. Coronary stent overlies the cardiac silhouette. Three lead pacer/defibrillator pack  overlies the left chest wall with leads at the apex, right atrium, and coronary sinus. Telemetry leads overlie the chest. Diffuse interstitial and airspace opacities throughout the lungs with some fluid thickening the right minor fissure. There is marked cardiomegaly, increased from comparison. No visible pneumothorax or evidence of left pleural fluid. No acute osseous or soft tissue abnormality. IMPRESSION: Marked cardiomegaly with diffuse interstitial and airspace opacities throughout the lungs. The findings are most compatible with moderate to severe CHF/volume overload. Please note, infection or inflammation could present with similar findings. Electronically Signed   By: Lovena Le M.D.   On: 01/03/2019 22:14    Telemetry    RSR - Personally Reviewed  Cardiac Studies   2D Echocardiogram 12.10.2020  1. Left ventricular ejection fraction, by visual estimation, is 25 to 30%. The left ventricle has severely decreased function. There is no left ventricular hypertrophy.  2. Definity contrast agent was given IV to delineate the left  ventricular endocardial borders.  3. Left ventricular diastolic parameters are consistent with Grade II diastolic dysfunction (pseudonormalization).  4. Mildly dilated left ventricular internal cavity size.  5. The left ventricle demonstrates global hypokinesis.  6. Global right ventricle was not well visualized.The right ventricular size is not well visualized. Right vetricular wall thickness was not assessed.  7. Left atrial size was mildly dilated.  8. TR signal is inadequate for assessing pulmonary artery systolic pressure.  Patient Profile     63 y.o. male with a history of CAD s/p CABG and subsequent LM and LCX/OM interventions, ICM/HFrEF s/p AICD, HTN, HL, DMII, obesity, and OSA on CPAP w/ O2, who was admitted 12/9 in the setting of acute pulm edema, slow VT, and NSTEMI.  Assessment & Plan    1.  NSTEMI/CAD:  Pt w/ complex history, s/p CABG x 2 w/  interventions to LM/LCX in 07/2017.  Patent LIMA  LAD w/ known occlusion of the VG  dLCX.  Did well until 12/9, developed back pain and abrupt onset of dyspnea, hypoxia.  In ED, he was noted to be in slow WCT/VT, which subsequently converted to sinus.  Hemodynamically stable throughout.  CT chest w/ CHF.  Initially req BiPAP but now on nasal cannula.  HsTrop up to 11,405, indicating primary ischemic event/NSTEMI, likely leading to slow VT and APE.  Feels much better following diuresis.  No c/p, back pain, dyspnea this AM.  Initially had planned to tx to [facility] for further eval by his primary cardiologist given complex anatomy and VT, however creat up slightly, therefor, no procedures today.  Cont heparin, asa, brilinta,  blocker, statin, zetia, nitrate.  Plan to follow creat this weekend and likely pursue cath on Monday @ [place].  2.  Acute on chronic combined systolic/diastolic CHF/Acute pulmonary edema:  In setting of above.  Much improved after diuresis yesterday - minus  2.1L.  Creat up this AM, therefor will hold lasix.  Cont  blocker.  Entresto not ordered on admission.  Will hold off on resumption right now, given sl rise in creat and soft bp's.  3.  AKI/CKD III: In setting of diuresis yesterday.  Hold lasix.  Follow creat this weekend.  4.  Essential HTN:  Stable to soft.  Entresto held on admission and will cont to hold off on resuming.  Follow.  5. HL: LDL 67. Cont statin rx.  6.  DMII:  Insulin per IM.  A1c recently 11.6.  7.  VT:  Slow VT on initial ECG.  On amio @ home and has ICD.  Call in to medtronic for interrogation to assess onset and burden of slow VT.  Amio inc to 400 BID for now to reload, though VT presumably ischemic in etiology given #1.  Signed, Murray Hodgkins, NP  01/05/2019, 7:53 AM    For questions or updates, please contact   Please consult [email or web address] for contact info under Cardiology/STEMI.

## 2019-01-05 NOTE — Progress Notes (Signed)
ANTICOAGULATION CONSULT NOTE   Pharmacy Consult for Heparin Indication: chest pain/ACS  Patient Measurements: Height: 5\' 7"  (170.2 cm) Weight: 217 lb 3.2 oz (98.5 kg) IBW/kg (Calculated) : 66.1 HEPARIN DW (KG): 87.4  Vital Signs: Temp: 98.1 F (36.7 C) (12/11 1352) Temp Source: Oral (12/11 1352) BP: 106/61 (12/11 1352) Pulse Rate: 65 (12/11 1352)  Labs: Recent Labs    01/03/19 2132 01/03/19 2132 01/04/19 0044 01/04/19 0640 01/04/19 1553 01/04/19 1710 01/05/19 0000 01/05/19 0726 01/05/19 1017 01/05/19 1712  HGB 15.2  --   --  13.3  --   --   --  12.6*  --   --   HCT 47.5  --   --  40.0  --   --   --  38.3*  --   --   PLT 312  --   --  185  --   --   --  190  --   --   LABPROT 13.1  --   --   --   --   --   --   --   --   --   INR 1.0  --   --   --   --   --   --   --   --   --   HEPARINUNFRC  --    < >  --   --   --   --  0.25*  --  0.43 0.34  CREATININE 1.54*  --   --  1.41*  --   --   --  1.75*  --   --   TROPONINIHS 39*  --  2,747*  --  11,045* 9,144*  --   --   --   --    < > = values in this interval not displayed.    Estimated Creatinine Clearance: 48.3 mL/min (A) (by C-G formula based on SCr of 1.75 mg/dL (H)).  Medical History: Past Medical History:  Diagnosis Date  . AICD (automatic cardioverter/defibrillator) present    [employer]tronic  . Arthritis    "thumbs"  (08/02/2017)  . CHF (congestive heart failure) ([place])   . Chronic combined systolic and diastolic heart failure ([place])    a. 08/2017 Echo: EF 20-25%, mod glob HK. Sev distal ant sept, inflat HK. Apical AK. Gr2 DD. Mildly reduced RV fxn.  . Colon polyps   . Coronary artery disease    a. s/p CABG x 2 (LIMA->LAD, VG->OM); b. Multiple PCI's to LM/LCX/OM; c. 07/2017 PTCA of LM/LCX w/ early ISR-->repeat PCI/DES to LM (3.5x20 Synergy DES) and LCX (3.0x20 Synergy DES); d. 07/2018 Relook Cath: LM patent stent, LAD 100ost, LCX patent stent, OM1 99/60, LIMA->LAD ok. VG->dLCX 49 (old).  . Depression   . Encounter for  assessment of implantable cardioverter-defibrillator (ICD) 09/27/2018  . High cholesterol   . Hypertension   . ICD; Biventricular  Medtronic ICD Amplia MRI QWuad CRT-D  in situ 10/29/14 10/29/2014   Remote ICD check 09.23.20:  One 6 beat NSVT. No therapy.  1 SVT episode @ 130 bpm (38 Sec).  There were 23 Vent sense episodes detected for up to 1.1 min/day (AT). Health trends (patient activity, heart rate variability, average heart rates) are stable.Trans-thoracic impedance trends and the OptiVol Fluid Index do no present significant abnormalities. Battery longevity is 4.3 years. RA pacing is 3  . Ischemic cardiomyopathy 09/27/2018  . MI (myocardial infarction) ([place]) 2003  . NSTEMI (non-ST elevated myocardial infarction) ([place]) 07/16/2014  . OSA on CPAP   .  Oxygen deficiency   . Pneumonia 10/2014  . Proteinuria   . Sleep apnea   . 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 amiodarone (PACERONE) 200 MG tablet Take 200 mg by mouth daily.    01/03/2019 at Unknown time  . aspirin EC 81 MG tablet Take 81 mg by mouth daily.   01/03/2019 at Unknown time  . carvedilol (COREG) 12.5 MG tablet Take 6.25 mg by mouth 2 (two) times daily with a meal.   01/03/2019 at Unknown time  . Cholecalciferol (VITAMIN D) 2000 units tablet Take 2,000 Units by mouth 2 (two) times daily.   01/03/2019 at Unknown time  . empagliflozin (JARDIANCE) 25 MG TABS tablet Take 25 mg by mouth daily.   01/03/2019 at Unknown time  . ezetimibe (ZETIA) 10 MG tablet Take 1 tablet (10 mg total) by mouth daily. (Patient taking differently: Take 10 mg by mouth at bedtime. ) 90 tablet 3 01/02/2019 at Unknown time  . insulin aspart protamine - aspart (NOVOLOG MIX 70/30 FLEXPEN) (70-30) 100 UNIT/ML FlexPen Inject 63 Units into the skin 2 (two) times daily as needed (as needed for blood sugar over 200). as needed for blood sugar over 200   01/03/2019 at  Unknown time  . isosorbide mononitrate (IMDUR) 30 MG 24 hr tablet Take 30 mg by mouth daily.   01/03/2019 at Unknown time  . liraglutide (VICTOZA) 18 MG/3ML SOPN Inject 1.8 mg into the skin daily.   01/03/2019 at Unknown time  . metFORMIN (GLUCOPHAGE-XR) 500 MG 24 hr tablet Take 500 mg by mouth 2 (two) times daily.    01/03/2019 at Unknown time  . metolazone (ZAROXOLYN) 2.5 MG tablet Take 2.5 mg by mouth daily as needed (for >2 lb weight gain).   prn at prn  . Omega-3 Fatty Acids (FISH OIL) 1000 MG CAPS Take 1,000 mg by mouth 2 (two) times daily.    01/03/2019 at Unknown time  . potassium chloride SA (K-DUR,KLOR-CON) 20 MEQ tablet Take 1 tablet (20 mEq total) by mouth 2 (two) times daily. 60 tablet 3 01/03/2019 at Unknown time  . PRESCRIPTION MEDICATION Inhale into the lungs at bedtime. CPAP   01/02/2019 at Unknown time  . rosuvastatin (CRESTOR) 40 MG tablet Take 40 mg by mouth at bedtime.   01/02/2019 at Unknown time  . sacubitril-valsartan (ENTRESTO) 97-103 MG Take 1 tablet by mouth 2 (two) times daily.    01/03/2019 at Unknown time  . sertraline (ZOLOFT) 100 MG tablet Take 50 mg by mouth at bedtime.    01/02/2019 at Unknown time  . ticagrelor (BRILINTA) 90 MG TABS tablet Take 1 tablet (90 mg total) by mouth 2 (two) times daily. 60 tablet 3 01/03/2019 at Unknown time  . torsemide (DEMADEX) 20 MG tablet Take 1 tablet (20 mg total) by mouth 2 (two) times daily. (Patient taking differently: Take 20 mg by mouth 2 (two) times daily as needed (fluid). ) 180 tablet 3 prn at prn    Assessment: Pharmacy asked to initiate and monitor Heparin for ACS/NSTEMI.   Pt not on anticoagulants per med list.  H&H, PLT trending down from baseline. The plan is to follow creatinine this weekend and likely pursue cath on Monday 12/14   Heparin Course: 12/10 initiation: 4000 units bolus, then 1000 units/hr 12/10 1800 HL 0.41: no change 12/11 0000 HL 0.25: rate inc to 1300 units/hr 12/11 1017  HL 0.43: therapeutic x 1 12/11  1712 HL 0.34: therapeutic x 2  Goal of Therapy:  Heparin level 0.3-0.7 units/ml Monitor platelets by anticoagulation protocol: Yes   Plan:   Heparin level therapeutic x2: continue heparin at 1300 units/hr  units/hr  HL/CBC in am   Lu Duffel, PharmD, BCPS Clinical Pharmacist 01/05/2019 6:39 PM

## 2019-01-05 NOTE — Progress Notes (Addendum)
Inpatient Diabetes Program Recommendations  AACE/ADA: New Consensus Statement on Inpatient Glycemic Control (2015)  Target Ranges:  Prepandial:   less than 140 mg/dL      Peak postprandial:   less than 180 mg/dL (1-2 hours)      Critically ill patients:  140 - 180 mg/dL   Results for Martin Bush, Martin Bush (MRN [id number]) as of 01/05/2019 07:41  Ref. Range 01/04/2019 10:28 01/04/2019 12:49 01/04/2019 18:14 01/04/2019 18:48 01/04/2019 21:46  Glucose-Capillary Latest Ref Range: 70 - 99 mg/dL 254 (H)  6 units NOVOLOG +  20 units 70/30 Insulin 212 (H) 118 (H)  6 units NOVOLOG +  20 units 70/30 Insulin 177 (H) 142 (H)   Results for Martin Bush, Martin Bush (MRN [id number]) as of 01/05/2019 12:53  Ref. Range 01/05/2019 07:58 01/05/2019 11:55  Glucose-Capillary Latest Ref Range: 70 - 99 mg/dL 150 (H)  2 units NOVOLOG +  20 units 70/30 Insulin  314 (H)  11 units NOVOLOG    Results for Martin Bush, Martin Bush (MRN [id number]) as of 01/05/2019 07:41  Ref. Range 01/19/2018 11:44 09/28/2018 14:17 12/27/2018 08:14  Hemoglobin A1C Latest Ref Range: 4.6 - 6.5 % 11.4 (A) 12.5 Repeated and verified X2. (H) 11.6 (H)  (287 mg/dl)    Admit with: Acute respiratory failure with hypoxia/ NSTEMI/ Acute on chronic combined systolic and diastolic CHF/ Hyperglycemia  History: DM, CABG X2, CKD3, CHF  Home DM Meds: Jardiance 25 mg Daily       70/30 Insulin 63 units BID       Metformin 500 mg BID       Victoza??  Current Orders: Novolog Moderate Correction Scale/ SSI (0-15 units) TID AC + HS      70/30 Insulin 20 units BID      MD- Note CBG at 12pm today was 314 mg/dl.  May consider increasing AM dose of 70/30 Insulin to 40 units QAM with Breakfast    Note 70/30 Insulin started yesterday AM (at reduced home dose).  Current A1c of 11.6% shows continued poor glucose control at home.  PCP: Dr. Idelle Leech seen 01/01/2019 via virtual visit--Per MD notes, the [place] reduced Martin Bush in half--Dr.  Caryl Bis went ahead and increased the Jardiance back to 25 mg Daily and stated in his notes he plans to refer pt to ENDO practice locally.  CBG 150 mg/dl this AM.   Addendum 12:45pm- Met with pt and wife today to discuss pt's elevated A1c of 11.6%.  PCP for Martin Bush aware of his A1c and increased his Jardiance dose back to 25 mg Daily at last PCP visit.  Pt told me he is lax with taking his insulin.  Often forgets or admitted to just not wanting to take it.  Discussed with pt and wife the extreme importance if taking his DM meds on a consistent basis to help better control his CBGs at home.  Emphasized the importance of good CBG control to help further prevent cardiac events and other organ damage.  Pt's wife told me they are going to try to start cooking more at home (get lots of take out).  Pt stated he does a good job of not drinking juicer or regular soda (stick to diet soda and unsweet tea).  Encouraged pt to try to eat a balanced diet and limit carbohydrate consumption.  Re-reviewed CBG goals for home.  Patient appreciative of my visit.    --Will follow patient during hospitalization--  Wyn Quaker RN, MSN, CDE Diabetes Coordinator Inpatient Glycemic Control Team  Team Pager: [phone] (8a-5p)

## 2019-01-05 NOTE — Progress Notes (Signed)
ANTICOAGULATION CONSULT NOTE   Pharmacy Consult for Heparin Indication: chest pain/ACS  Patient Measurements: Height: 5\' 7"  (170.2 cm) Weight: 209 lb 3.5 oz (94.9 kg) IBW/kg (Calculated) : 66.1 HEPARIN DW (KG): 87.2  Vital Signs: Temp: 98 F (36.7 C) (12/11 0300) Temp Source: Oral (12/11 0300) BP: 93/64 (12/11 0457) Pulse Rate: 62 (12/11 0457)  Labs: Recent Labs    01/03/19 2132 01/04/19 0044 01/04/19 0640 01/04/19 1051 01/04/19 1553 01/04/19 1710 01/04/19 1800 01/05/19 0000  HGB 15.2  --  13.3  --   --   --   --   --   HCT 47.5  --  40.0  --   --   --   --   --   PLT 312  --  185  --   --   --   --   --   LABPROT 13.1  --   --   --   --   --   --   --   INR 1.0  --   --   --   --   --   --   --   HEPARINUNFRC  --   --   --  0.24*  --   --  0.41 0.25*  CREATININE 1.54*  --  1.41*  --   --   --   --   --   TROPONINIHS 39* 2,747*  --   --  11,045* 9,144*  --   --     Estimated Creatinine Clearance: 58.9 mL/min (A) (by C-G formula based on SCr of 1.41 mg/dL (H)).  Medical History: Past Medical History:  Diagnosis Date  . AICD (automatic cardioverter/defibrillator) present    Medtronic  . Arthritis    "thumbs"  (08/02/2017)  . CHF (congestive heart failure) ([place])   . Chronic combined systolic and diastolic heart failure ([place])    a. 08/2017 Echo: EF 20-25%, mod glob HK. Sev distal ant sept, inflat HK. Apical AK. Gr2 DD. Mildly reduced RV fxn.  . Colon polyps   . Coronary artery disease    a. s/p CABG x 2 (LIMA->LAD, VG->OM); b. Multiple PCI's to LM/LCX/OM; c. 07/2017 PTCA of LM/LCX w/ early ISR-->repeat PCI/DES to LM (3.5x20 Synergy DES) and LCX (3.0x20 Synergy DES); d. 07/2018 Relook Cath: LM patent stent, LAD 100ost, LCX patent stent, OM1 99/60, LIMA->LAD ok. VG->dLCX 4 (old).  . Depression   . Encounter for assessment of implantable cardioverter-defibrillator (ICD) 09/27/2018  . High cholesterol   . Hypertension   . ICD; Biventricular  Medtronic ICD Amplia MRI QWuad  CRT-D  in situ 10/29/14 10/29/2014   Remote ICD check 09.23.20:  One 6 beat NSVT. No therapy.  1 SVT episode @ 130 bpm (38 Sec).  There were 23 Vent sense episodes detected for up to 1.1 min/day (AT). Health trends (patient activity, heart rate variability, average heart rates) are stable.Trans-thoracic impedance trends and the OptiVol Fluid Index do no present significant abnormalities. Battery longevity is 4.3 years. RA pacing is 3  . Ischemic cardiomyopathy 09/27/2018  . MI (myocardial infarction) ([place]) 2003  . NSTEMI (non-ST elevated myocardial infarction) ([place]) 07/16/2014  . OSA on CPAP   . Oxygen deficiency   . Pneumonia 10/2014  . Proteinuria   . Sleep apnea   . 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 amiodarone (PACERONE) 200 MG tablet Take 200  mg by mouth daily.    01/03/2019 at Unknown time  . aspirin EC 81 MG tablet Take 81 mg by mouth daily.   01/03/2019 at Unknown time  . carvedilol (COREG) 12.5 MG tablet Take 6.25 mg by mouth 2 (two) times daily with a meal.   01/03/2019 at Unknown time  . Cholecalciferol (VITAMIN D) 2000 units tablet Take 2,000 Units by mouth 2 (two) times daily.   01/03/2019 at Unknown time  . empagliflozin (JARDIANCE) 25 MG TABS tablet Take 25 mg by mouth daily.   01/03/2019 at Unknown time  . ezetimibe (ZETIA) 10 MG tablet Take 1 tablet (10 mg total) by mouth daily. (Patient taking differently: Take 10 mg by mouth at bedtime. ) 90 tablet 3 01/02/2019 at Unknown time  . insulin aspart protamine - aspart (NOVOLOG MIX 70/30 FLEXPEN) (70-30) 100 UNIT/ML FlexPen Inject 63 Units into the skin 2 (two) times daily as needed (as needed for blood sugar over 200). as needed for blood sugar over 200   01/03/2019 at Unknown time  . isosorbide mononitrate (IMDUR) 30 MG 24 hr tablet Take 30 mg by mouth daily.   01/03/2019 at Unknown time  . liraglutide (VICTOZA) 18 MG/3ML SOPN  Inject 1.8 mg into the skin daily.   01/03/2019 at Unknown time  . metFORMIN (GLUCOPHAGE-XR) 500 MG 24 hr tablet Take 500 mg by mouth 2 (two) times daily.    01/03/2019 at Unknown time  . metolazone (ZAROXOLYN) 2.5 MG tablet Take 2.5 mg by mouth daily as needed (for >2 lb weight gain).   prn at prn  . Omega-3 Fatty Acids (FISH OIL) 1000 MG CAPS Take 1,000 mg by mouth 2 (two) times daily.    01/03/2019 at Unknown time  . potassium chloride SA (K-DUR,KLOR-CON) 20 MEQ tablet Take 1 tablet (20 mEq total) by mouth 2 (two) times daily. 60 tablet 3 01/03/2019 at Unknown time  . PRESCRIPTION MEDICATION Inhale into the lungs at bedtime. CPAP   01/02/2019 at Unknown time  . rosuvastatin (CRESTOR) 40 MG tablet Take 40 mg by mouth at bedtime.   01/02/2019 at Unknown time  . sacubitril-valsartan (ENTRESTO) 97-103 MG Take 1 tablet by mouth 2 (two) times daily.    01/03/2019 at Unknown time  . sertraline (ZOLOFT) 100 MG tablet Take 50 mg by mouth at bedtime.    01/02/2019 at Unknown time  . ticagrelor (BRILINTA) 90 MG TABS tablet Take 1 tablet (90 mg total) by mouth 2 (two) times daily. 60 tablet 3 01/03/2019 at Unknown time  . torsemide (DEMADEX) 20 MG tablet Take 1 tablet (20 mg total) by mouth 2 (two) times daily. (Patient taking differently: Take 20 mg by mouth 2 (two) times daily as needed (fluid). ) 180 tablet 3 prn at prn    Assessment: Pharmacy asked to initiate and monitor Heparin for ACS/NSTEMI.   Pt not on anticoagulants per med list.  H&H, PLT trending down from baseline. The plan is to follow creatinine this weekend and likely pursue cath on Monday 12/14   Heparin Course: 12/10 initiation: 4000 units bolus, then 1000 units/hr 12/10 1800 HL 0.41: no change 12/11 0000 HL 0.25: rate inc to 1300 units/hr 12/11 1017 HL 0.43: no change  Goal of Therapy:  Heparin level 0.3-0.7 units/ml Monitor platelets by anticoagulation protocol: Yes   Plan:   Heparin level therapeutic x1: continue heparin at 1300  units/hr  units/hr  recheck HL in 6 hrs: if therapeutic x2 follow-ups may be done once daily  CBC  in am   Dallie Piles, PharmD 01/05/2019,7:22 AM

## 2019-01-05 NOTE — Progress Notes (Signed)
[facility] for Heparin Indication: chest pain/ACS  Allergies  Allergen Reactions  . Diltiazem Rash   Patient Measurements: Height: 5\' 7"  (170.2 cm) Weight: 216 lb (98 kg) IBW/kg (Calculated) : 66.1 HEPARIN DW (KG): 87.2  Vital Signs: Temp: 97.5 F (36.4 C) (12/10 1944) Temp Source: Axillary (12/10 1944) BP: 93/59 (12/11 0000) Pulse Rate: 66 (12/11 0000)  Labs: Recent Labs    01/03/19 2132 01/04/19 0044 01/04/19 0640 01/04/19 1051 01/04/19 1553 01/04/19 1710 01/04/19 1800 01/05/19 0000  HGB 15.2  --  13.3  --   --   --   --   --   HCT 47.5  --  40.0  --   --   --   --   --   PLT 312  --  185  --   --   --   --   --   LABPROT 13.1  --   --   --   --   --   --   --   INR 1.0  --   --   --   --   --   --   --   HEPARINUNFRC  --   --   --  0.24*  --   --  0.41 0.25*  CREATININE 1.54*  --  1.41*  --   --   --   --   --   TROPONINIHS 39* 2,747*  --   --  11,045* 9,144*  --   --     Estimated Creatinine Clearance: 59.8 mL/min (A) (by C-G formula based on SCr of 1.41 mg/dL (H)).  Medical History: Past Medical History:  Diagnosis Date  . AICD (automatic cardioverter/defibrillator) present    Medtronic  . Arthritis    "thumbs"  (08/02/2017)  . CHF (congestive heart failure) ([place])   . Chronic combined systolic and diastolic heart failure ([place])    a. 08/2017 Echo: EF 20-25%, mod glob HK. Sev distal ant sept, inflat HK. Apical AK. Gr2 DD. Mildly reduced RV fxn.  . Colon polyps   . Coronary artery disease    a. s/p CABG x 2 (LIMA->LAD, VG->OM); b. Multiple PCI's to LM/LCX/OM; c. 07/2017 PTCA of LM/LCX w/ early ISR-->repeat PCI/DES to LM (3.5x20 Synergy DES) and LCX (3.0x20 Synergy DES); d. 07/2018 Relook Cath: LM patent stent, LAD 100ost, LCX patent stent, OM1 99/60, LIMA->LAD ok. VG->dLCX 24 (old).  . Depression   . Encounter for assessment of implantable cardioverter-defibrillator (ICD) 09/27/2018  . High cholesterol   . Hypertension   .  ICD; Biventricular  Medtronic ICD Amplia MRI QWuad CRT-D  in situ 10/29/14 10/29/2014   Remote ICD check 09.23.20:  One 6 beat NSVT. No therapy.  1 SVT episode @ 130 bpm (38 Sec).  There were 23 Vent sense episodes detected for up to 1.1 min/day (AT). Health trends (patient activity, heart rate variability, average heart rates) are stable.Trans-thoracic impedance trends and the OptiVol Fluid Index do no present significant abnormalities. Battery longevity is 4.3 years. RA pacing is 3  . Ischemic cardiomyopathy 09/27/2018  . MI (myocardial infarction) ([place]) 2003  . NSTEMI (non-ST elevated myocardial infarction) ([place]) 07/16/2014  . OSA on CPAP   . Oxygen deficiency   . Pneumonia 10/2014  . Proteinuria   . Sleep apnea   . 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  amiodarone (PACERONE) 200 MG tablet Take 200 mg by mouth daily.    01/03/2019 at Unknown time  . aspirin EC 81 MG tablet Take 81 mg by mouth daily.   01/03/2019 at Unknown time  . carvedilol (COREG) 12.5 MG tablet Take 6.25 mg by mouth 2 (two) times daily with a meal.   01/03/2019 at Unknown time  . Cholecalciferol (VITAMIN D) 2000 units tablet Take 2,000 Units by mouth 2 (two) times daily.   01/03/2019 at Unknown time  . empagliflozin (JARDIANCE) 25 MG TABS tablet Take 25 mg by mouth daily.   01/03/2019 at Unknown time  . ezetimibe (ZETIA) 10 MG tablet Take 1 tablet (10 mg total) by mouth daily. (Patient taking differently: Take 10 mg by mouth at bedtime. ) 90 tablet 3 01/02/2019 at Unknown time  . insulin aspart protamine - aspart (NOVOLOG MIX 70/30 FLEXPEN) (70-30) 100 UNIT/ML FlexPen Inject 63 Units into the skin 2 (two) times daily as needed (as needed for blood sugar over 200). as needed for blood sugar over 200   01/03/2019 at Unknown time  . isosorbide mononitrate (IMDUR) 30 MG 24 hr tablet Take 30 mg by mouth daily.   01/03/2019 at  Unknown time  . liraglutide (VICTOZA) 18 MG/3ML SOPN Inject 1.8 mg into the skin daily.   01/03/2019 at Unknown time  . metFORMIN (GLUCOPHAGE-XR) 500 MG 24 hr tablet Take 500 mg by mouth 2 (two) times daily.    01/03/2019 at Unknown time  . metolazone (ZAROXOLYN) 2.5 MG tablet Take 2.5 mg by mouth daily as needed (for >2 lb weight gain).   prn at prn  . Omega-3 Fatty Acids (FISH OIL) 1000 MG CAPS Take 1,000 mg by mouth 2 (two) times daily.    01/03/2019 at Unknown time  . potassium chloride SA (K-DUR,KLOR-CON) 20 MEQ tablet Take 1 tablet (20 mEq total) by mouth 2 (two) times daily. 60 tablet 3 01/03/2019 at Unknown time  . PRESCRIPTION MEDICATION Inhale into the lungs at bedtime. CPAP   01/02/2019 at Unknown time  . rosuvastatin (CRESTOR) 40 MG tablet Take 40 mg by mouth at bedtime.   01/02/2019 at Unknown time  . sacubitril-valsartan (ENTRESTO) 97-103 MG Take 1 tablet by mouth 2 (two) times daily.    01/03/2019 at Unknown time  . sertraline (ZOLOFT) 100 MG tablet Take 50 mg by mouth at bedtime.    01/02/2019 at Unknown time  . ticagrelor (BRILINTA) 90 MG TABS tablet Take 1 tablet (90 mg total) by mouth 2 (two) times daily. 60 tablet 3 01/03/2019 at Unknown time  . torsemide (DEMADEX) 20 MG tablet Take 1 tablet (20 mg total) by mouth 2 (two) times daily. (Patient taking differently: Take 20 mg by mouth 2 (two) times daily as needed (fluid). ) 180 tablet 3 prn at prn    Assessment: Pharmacy asked to initiate and monitor Heparin for ACS.  Heparin was initiated during computer downtime.  Pt not on anticoagulants per med list.    12/10 @ 1051 HL: 0.24 (HL was supposed to be drawn 6 hours after infusion started but it is late). Though likely will not impact level. Confirmed with ED nurse no heparin interruption.   Goal of Therapy:  Heparin level 0.3-0.7 units/ml Monitor platelets by anticoagulation protocol: Yes   Plan:  12/10 1800 HL 0.41 therapeutic x 1. Continue heparin at 1150 units/hr. HL at midnight  to confirm. CBC daily while on heparin drip.  12/11 0000 HL 0.25, subtherapeutic, will increase heparin to 1300  units/hr and recheck HL in 6 hrs   Ena Dawley, PharmD 01/05/2019,1:01 AM

## 2019-01-05 NOTE — Plan of Care (Signed)

## 2019-01-06 LAB — GLUCOSE, CAPILLARY
Glucose-Capillary: 258 mg/dL — ABNORMAL HIGH (ref 70–99)
Glucose-Capillary: 236 mg/dL — ABNORMAL HIGH (ref 70–99)
Glucose-Capillary: 239 mg/dL — ABNORMAL HIGH (ref 70–99)
Glucose-Capillary: 252 mg/dL — ABNORMAL HIGH (ref 70–99)

## 2019-01-06 LAB — BASIC METABOLIC PANEL
Anion gap: 10 (ref 5–15)
BUN: 48 mg/dL — ABNORMAL HIGH (ref 8–23)
CO2: 24 mmol/L (ref 22–32)
Calcium: 8.7 mg/dL — ABNORMAL LOW (ref 8.9–10.3)
Chloride: 98 mmol/L (ref 98–111)
Creatinine, Ser: 1.85 mg/dL — ABNORMAL HIGH (ref 0.61–1.24)
GFR calc Af Amer: 44 mL/min — ABNORMAL LOW (ref >60)
GFR calc non Af Amer: 38 mL/min — ABNORMAL LOW (ref >60)
Glucose, Bld: 266 mg/dL — ABNORMAL HIGH (ref 70–99)
Potassium: 4.2 mmol/L (ref 3.5–5.1)
Sodium: 132 mmol/L — ABNORMAL LOW (ref 135–145)

## 2019-01-06 LAB — CBC
HCT: 36.8 % — ABNORMAL LOW (ref 39.0–52.0)
Hemoglobin: 12.6 g/dL — ABNORMAL LOW (ref 13.0–17.0)
MCH: 30.1 pg (ref 26.0–34.0)
MCHC: 34.2 g/dL (ref 30.0–36.0)
MCV: 87.8 fL (ref 80.0–100.0)
Platelets: 180 10*3/uL (ref 150–400)
RBC: 4.19 MIL/uL — ABNORMAL LOW (ref 4.22–5.81)
RDW: 12.7 % (ref 11.5–15.5)
WBC: 7.8 10*3/uL (ref 4.0–10.5)
nRBC: 0 % (ref 0.0–0.2)

## 2019-01-06 LAB — HEPARIN LEVEL (UNFRACTIONATED)
Heparin Unfractionated: 0.16 IU/mL — ABNORMAL LOW (ref 0.30–0.70)
Heparin Unfractionated: 0.35 IU/mL (ref 0.30–0.70)
Heparin Unfractionated: 0.37 IU/mL (ref 0.30–0.70)

## 2019-01-06 MED ORDER — INSULIN ASPART PROT & ASPART (70-30 MIX) 100 UNIT/ML ~~LOC~~ SUSP
25.0000 [IU] | Freq: Two times a day (BID) | SUBCUTANEOUS | Status: DC
  Administered 2019-01-06 – 2019-01-08 (×4): 25 [IU] via SUBCUTANEOUS
  Filled 2019-01-06 (×4): qty 10

## 2019-01-06 NOTE — Progress Notes (Signed)
ANTICOAGULATION CONSULT NOTE   Pharmacy Consult for Heparin Indication: chest pain/ACS  Patient Measurements: Height: 5\' 7"  (170.2 cm) Weight: 215 lb 11.2 oz (97.8 kg) IBW/kg (Calculated) : 66.1 HEPARIN DW (KG): 87.4  Vital Signs: Temp: 98.3 F (36.8 C) (12/12 2029) Temp Source: Oral (12/12 2029) BP: 108/59 (12/12 2029) Pulse Rate: 60 (12/12 2029)  Labs: Recent Labs    01/04/19 0044 01/04/19 0640 01/04/19 1553 01/04/19 1710 01/05/19 0726 01/06/19 0558 01/06/19 1454 01/06/19 2246  HGB  --  13.3  --   --  12.6* 12.6*  --   --   HCT  --  40.0  --   --  38.3* 36.8*  --   --   PLT  --  185  --   --  190 180  --   --   HEPARINUNFRC  --   --   --   --   --  0.16* 0.35 0.37  CREATININE  --  1.41*  --   --  1.75* 1.85*  --   --   TROPONINIHS 2,747*  --  11,045* 9,144*  --   --   --   --     Estimated Creatinine Clearance: 45.6 mL/min (A) (by C-G formula based on SCr of 1.85 mg/dL (H)).  Medical History: Past Medical History:  Diagnosis Date  . AICD (automatic cardioverter/defibrillator) present    Medtronic  . Arthritis    "thumbs"  (08/02/2017)  . CHF (congestive heart failure) ([place])   . Chronic combined systolic and diastolic heart failure ([place])    a. 08/2017 Echo: EF 20-25%, mod glob HK. Sev distal ant sept, inflat HK. Apical AK. Gr2 DD. Mildly reduced RV fxn.  . Colon polyps   . Coronary artery disease    a. s/p CABG x 2 (LIMA->LAD, VG->OM); b. Multiple PCI's to LM/LCX/OM; c. 07/2017 PTCA of LM/LCX w/ early ISR-->repeat PCI/DES to LM (3.5x20 Synergy DES) and LCX (3.0x20 Synergy DES); d. 07/2018 Relook Cath: LM patent stent, LAD 100ost, LCX patent stent, OM1 99/60, LIMA->LAD ok. VG->dLCX 34 (old).  . Depression   . Encounter for assessment of implantable cardioverter-defibrillator (ICD) 09/27/2018  . High cholesterol   . Hypertension   . ICD; Biventricular  Medtronic ICD Amplia MRI QWuad CRT-D  in situ 10/29/14 10/29/2014   Remote ICD check 09.23.20:  One 6 beat NSVT. No  therapy.  1 SVT episode @ 130 bpm (38 Sec).  There were 23 Vent sense episodes detected for up to 1.1 min/day (AT). Health trends (patient activity, heart rate variability, average heart rates) are stable.Trans-thoracic impedance trends and the OptiVol Fluid Index do no present significant abnormalities. Battery longevity is 4.3 years. RA pacing is 3  . Ischemic cardiomyopathy 09/27/2018  . MI (myocardial infarction) ([place]) 2003  . NSTEMI (non-ST elevated myocardial infarction) ([place]) 07/16/2014  . OSA on CPAP   . Oxygen deficiency   . Pneumonia 10/2014  . Proteinuria   . Sleep apnea   . 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 amiodarone (PACERONE) 200 MG tablet Take 200 mg by mouth daily.    01/03/2019 at Unknown time  . aspirin EC 81 MG tablet Take 81 mg by mouth daily.   01/03/2019 at Unknown time  . carvedilol (COREG) 12.5 MG tablet Take 6.25 mg by mouth 2 (two) times daily with a meal.   01/03/2019 at Unknown  time  . Cholecalciferol (VITAMIN D) 2000 units tablet Take 2,000 Units by mouth 2 (two) times daily.   01/03/2019 at Unknown time  . empagliflozin (JARDIANCE) 25 MG TABS tablet Take 25 mg by mouth daily.   01/03/2019 at Unknown time  . ezetimibe (ZETIA) 10 MG tablet Take 1 tablet (10 mg total) by mouth daily. (Patient taking differently: Take 10 mg by mouth at bedtime. ) 90 tablet 3 01/02/2019 at Unknown time  . insulin aspart protamine - aspart (NOVOLOG MIX 70/30 FLEXPEN) (70-30) 100 UNIT/ML FlexPen Inject 63 Units into the skin 2 (two) times daily as needed (as needed for blood sugar over 200). as needed for blood sugar over 200   01/03/2019 at Unknown time  . isosorbide mononitrate (IMDUR) 30 MG 24 hr tablet Take 30 mg by mouth daily.   01/03/2019 at Unknown time  . liraglutide (VICTOZA) 18 MG/3ML SOPN Inject 1.8 mg into the skin daily.   01/03/2019 at Unknown time  . metFORMIN  (GLUCOPHAGE-XR) 500 MG 24 hr tablet Take 500 mg by mouth 2 (two) times daily.    01/03/2019 at Unknown time  . metolazone (ZAROXOLYN) 2.5 MG tablet Take 2.5 mg by mouth daily as needed (for >2 lb weight gain).   prn at prn  . Omega-3 Fatty Acids (FISH OIL) 1000 MG CAPS Take 1,000 mg by mouth 2 (two) times daily.    01/03/2019 at Unknown time  . potassium chloride SA (K-DUR,KLOR-CON) 20 MEQ tablet Take 1 tablet (20 mEq total) by mouth 2 (two) times daily. 60 tablet 3 01/03/2019 at Unknown time  . PRESCRIPTION MEDICATION Inhale into the lungs at bedtime. CPAP   01/02/2019 at Unknown time  . rosuvastatin (CRESTOR) 40 MG tablet Take 40 mg by mouth at bedtime.   01/02/2019 at Unknown time  . sacubitril-valsartan (ENTRESTO) 97-103 MG Take 1 tablet by mouth 2 (two) times daily.    01/03/2019 at Unknown time  . sertraline (ZOLOFT) 100 MG tablet Take 50 mg by mouth at bedtime.    01/02/2019 at Unknown time  . ticagrelor (BRILINTA) 90 MG TABS tablet Take 1 tablet (90 mg total) by mouth 2 (two) times daily. 60 tablet 3 01/03/2019 at Unknown time  . torsemide (DEMADEX) 20 MG tablet Take 1 tablet (20 mg total) by mouth 2 (two) times daily. (Patient taking differently: Take 20 mg by mouth 2 (two) times daily as needed (fluid). ) 180 tablet 3 prn at prn    Assessment: Pharmacy asked to initiate and monitor Heparin for ACS/NSTEMI.   Pt not on anticoagulants per med list.  H&H, PLT trending down from baseline. The plan is to follow creatinine this weekend and likely pursue cath on Monday 12/14   Heparin Course: 12/10 initiation: 4000 units bolus, then 1000 units/hr 12/10 1800 HL 0.41: no change 12/11 0000 HL 0.25: rate inc to 1300 units/hr 12/11 1017 HL 0.43: therapeutic x 1 12/11 1712 HL 0.34: therapeutic x 2 12/12 0558 HL 0.16: subtherapeutic, confirmed w/ RN no issues w/ infusion. 12/12 1454 HL 0.35 therapeutic x 1 @ 1500 units/hr 12/12 2246 HL 0.37 therapeutic x 2 @ 1500 units/hr  Goal of Therapy:  Heparin  level 0.3-0.7 units/ml Monitor platelets by anticoagulation protocol: Yes   Plan:   Heparin level therapeutic x 2, will continue 1500 units/hr, recheck qam    HL/CBC in am   Ena Dawley, PharmD Clinical Pharmacist 01/06/2019 11:49 PM

## 2019-01-06 NOTE — Plan of Care (Signed)

## 2019-01-06 NOTE — Progress Notes (Signed)
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 NP was elevated at 1200 and initial troponin was elevated at 39. Cell count was elevated at 15,000. Peak troponin after 2 hours is 2700.CTA chest showed no PE but did show bilateral pleural effusions and pulmonary edema. Cardiology was consulted.  Consultants:   Cardiology  Procedures: CT, echo  Antimicrobials:   None  Subjective: No sob or cp. No complaints today  Objective: Vitals:   01/05/19 1352 01/05/19 1944 01/06/19 0405 01/06/19 0836  BP: 106/61 118/69 127/75 (!) 112/53  Pulse: 65 66 66 64  Resp: 20 20 20 18   Temp: 98.1 F (36.7 C) 98.3 F (36.8 C) 98.1 F (36.7 C) 97.6 F (36.4 C)  TempSrc: Oral Oral Oral Oral  SpO2: 99% 97% 96% 96%  Weight: 98.5 kg  97.8 kg   Height: 5\' 7"  (1.702 m)       Intake/Output Summary (Last 24 hours) at 01/06/2019 1423 Last data filed at 01/06/2019 1215 Gross per 24 hour  Intake 483 ml  Output 3150 ml  Net -2667 ml   Filed Weights   01/05/19 0500 01/05/19 1352 01/06/19 0405  Weight: 94.9 kg 98.5 kg 97.8 kg    Examination:  General exam: Appears calm and comfortable, NAD can lay flat in bed Respiratory system: Clear to auscultation. Respiratory effort normal.  No rales rhonchi's wheezing Cardiovascular system: S1 & S2 heard, RRR. No JVD, murmurs, rubs, gallops or clicks.  Gastrointestinal  system: Abdomen is obese, nondistended, soft and nontender,Normal bowel sounds heard.  No guarding Central nervous system: Alert and oriented x3. No focal neurological deficits. Extremities: No edema cyanosis   Consultants:   Cardiology  Procedures: CT, echo  Antimicrobials:   None      Data Reviewed: I have personally reviewed following labs and imaging studies  CBC: Recent Labs  Lab 01/03/19 2132 01/04/19 0640 01/05/19 0726 01/06/19 0558  WBC 15.8* 9.3 9.1 7.8  NEUTROABS 9.5*  --   --   --   HGB 15.2 13.3 12.6* 12.6*  HCT 47.5 40.0 38.3* 36.8*  MCV 95.8 91.3 92.5 87.8  PLT 312 185 190 [id number]   Basic Metabolic Panel: Recent Labs  Lab 01/03/19 2132 01/04/19 0640 01/05/19 0726 01/06/19 0558  NA 134* 136 134* 132*  K 3.9 4.4 3.5 4.2  CL 101 101 98 98  CO2 20* 25 24 24   GLUCOSE 573* 370* 147* 266*  BUN 20 23 34* 48*  CREATININE 1.54* 1.41* 1.75* 1.85*  CALCIUM 9.1 8.9 8.4* 8.7*   GFR: Estimated Creatinine Clearance: 45.6 mL/min (A) (by C-G formula based on SCr of 1.85 mg/dL (H)). Liver Function Tests: Recent Labs  Lab 01/03/19 2132  AST 21  ALT 15  ALKPHOS 94  BILITOT 0.6  PROT 8.0  ALBUMIN 4.3   No results for input(s): LIPASE, AMYLASE in the last 168 hours. No results for input(s): AMMONIA in the last 168 hours. Coagulation  Profile: Recent Labs  Lab 01/03/19 2132  INR 1.0   Cardiac Enzymes: No results for input(s): CKTOTAL, CKMB, CKMBINDEX, TROPONINI in the last 168 hours. BNP (last 3 results) No results for input(s): PROBNP in the last 8760 hours. HbA1C: No results for input(s): HGBA1C in the last 72 hours. CBG: Recent Labs  Lab 01/05/19 1155 01/05/19 1648 01/05/19 2128 01/06/19 0834 01/06/19 1134  GLUCAP 314* 249* 220* 258* 236*   Lipid Profile: Recent Labs    01/04/19 0640  CHOL 142  HDL 45  LDLCALC 67  TRIG 148  CHOLHDL 3.2   Thyroid Function Tests: No results for input(s): TSH, T4TOTAL, FREET4, T3FREE, THYROIDAB in  the last 72 hours. Anemia Panel: No results for input(s): VITAMINB12, FOLATE, FERRITIN, TIBC, IRON, RETICCTPCT in the last 72 hours. Sepsis Labs: Recent Labs  Lab 01/04/19 0044  PROCALCITON <0.10    Recent Results (from the past 240 hour(s))  Blood culture (routine x 2)     Status: None (Preliminary result)   Collection Time: 01/03/19  9:32 PM   Specimen: BLOOD  Result Value Ref Range Status   Specimen Description BLOOD BLOOD LEFT HAND  Final   Special Requests   Final    BOTTLES DRAWN AEROBIC AND ANAEROBIC Blood Culture adequate volume   Culture   Final    NO GROWTH 3 DAYS Performed at [facility], [address], [place], [place] [address]    Report Status PENDING  Incomplete  Blood culture (routine x 2)     Status: None (Preliminary result)   Collection Time: 01/03/19  9:32 PM   Specimen: BLOOD  Result Value Ref Range Status   Specimen Description BLOOD BLOOD LEFT HAND  Final   Special Requests   Final    BOTTLES DRAWN AEROBIC AND ANAEROBIC Blood Culture results may not be optimal due to an excessive volume of blood received in culture bottles   Culture   Final    NO GROWTH 3 DAYS Performed at [facility], [address], [place], [place] [address]    Report Status PENDING  Incomplete  Respiratory Panel by RT PCR (Flu A&B, Covid) - Nasopharyngeal Swab     Status: None   Collection Time: 01/03/19 10:49 PM   Specimen: Nasopharyngeal Swab  Result Value Ref Range Status   SARS Coronavirus 2 by RT PCR NEGATIVE NEGATIVE Final    Comment: (NOTE) SARS-CoV-2 target nucleic acids are NOT DETECTED. The SARS-CoV-2 RNA is generally detectable in upper respiratoy specimens during the acute phase of infection. The lowest concentration of SARS-CoV-2 viral copies this assay can detect is 131 copies/mL. A negative result does not preclude SARS-Cov-2 infection and should not be used as the sole basis for treatment or other patient management decisions. A negative  result may occur with  improper specimen collection/handling, submission of specimen other than nasopharyngeal swab, presence of viral mutation(s) within the areas targeted by this assay, and inadequate number of viral copies (<131 copies/mL). A negative result must be combined with clinical observations, patient history, and epidemiological information. The expected result is Negative. Fact Sheet for Patients:  PinkCheek.be Fact Sheet for Healthcare Providers:  GravelBags.it This test is not yet ap proved or cleared by the [place] FDA and  has been authorized for detection and/or diagnosis of SARS-CoV-2 by FDA under an Emergency Use Authorization (EUA). This EUA will remain  in effect (meaning this test can be used) for the duration of the COVID-19 declaration under Section 564(b)(1) of the  Act, 21 U.S.C. section 360bbb-3(b)(1), unless the authorization is terminated or revoked sooner.    Influenza A by PCR NEGATIVE NEGATIVE Final   Influenza B by PCR NEGATIVE NEGATIVE Final    Comment: (NOTE) The Xpert Xpress SARS-CoV-2/FLU/RSV assay is intended as an aid in  the diagnosis of influenza from Nasopharyngeal swab specimens and  should not be used as a sole basis for treatment. Nasal washings and  aspirates are unacceptable for Xpert Xpress SARS-CoV-2/FLU/RSV  testing. Fact Sheet for Patients: PinkCheek.be Fact Sheet for Healthcare Providers: GravelBags.it This test is not yet approved or cleared by the [place] FDA and  has been authorized for detection and/or diagnosis of SARS-CoV-2 by  FDA under an Emergency Use Authorization (EUA). This EUA will remain  in effect (meaning this test can be used) for the duration of the  Covid-19 declaration under Section 564(b)(1) of the Act, 21  U.S.C. section 360bbb-3(b)(1), unless the authorization is  terminated or  revoked. Performed at [facility], [address] [place], [place] [address]   MRSA PCR Screening     Status: None   Collection Time: 01/04/19  7:02 PM   Specimen: Nasal Mucosa; Nasopharyngeal  Result Value Ref Range Status   MRSA by PCR NEGATIVE NEGATIVE Final    Comment:        The GeneXpert MRSA Assay (FDA approved for NASAL specimens only), is one component of a comprehensive MRSA colonization surveillance program. It is not intended to diagnose MRSA infection nor to guide or monitor treatment for MRSA infections. Performed at [facility], [address] [place], [place] [address]          Radiology Studies: No results found.      Scheduled Meds: . amiodarone  400 mg Oral BID  . aspirin EC  81 mg Oral Daily  . carvedilol  6.25 mg Oral BID WC  . Chlorhexidine Gluconate Cloth  6 each Topical Daily  . cholecalciferol  2,000 Units Oral BID  . ezetimibe  10 mg Oral QHS  . insulin aspart  0-15 Units Subcutaneous TID WC  . insulin aspart  0-5 Units Subcutaneous QHS  . insulin aspart protamine- aspart  20 Units Subcutaneous BID WC  . isosorbide mononitrate  30 mg Oral Daily  . nitroGLYCERIN  1 inch Topical Q6H  . potassium chloride SA  20 mEq Oral BID  . rosuvastatin  40 mg Oral QHS  . sertraline  50 mg Oral QHS  . sodium chloride flush  3 mL Intravenous Q12H  . ticagrelor  90 mg Oral BID   Continuous Infusions: . sodium chloride    . heparin 1,500 Units/hr (01/06/19 0727)    Assessment & Plan:   Principal Problem:   NSTEMI (non-ST elevated myocardial infarction) ([place]) Active Problems:   Insulin dependent type 2 diabetes mellitus ([place])   HTN (hypertension), benign   CKD (chronic kidney disease) stage 3, GFR 30-59 ml/min   Acute on chronic combined systolic and diastolic CHF (congestive heart failure) ([place])   Hx of CABG   Acute respiratory failure ([place])   1.  Acute on chronic combined systolic diastolic CHF Patient does report  having eaten salty foods dietary noncompliance likely is attributing. Covid negative. CT negative for PE Clinically has improved and more euvolemic Creatinine increased-IV Lasix discontinued We will discontinue Entresto to for now Echo EF 25 to 30%   2.  NSTEMI- Troponin elevated, possibly due to demand ischemia Continue with aspirin statin beta-blockers nitro [facility]  following On heparin drip.continue carvedilol and aspirin  plan to hold cath until creatinine improves, possibly Monday   3.  Hypertension- Actually with low BP currently after diuresis Hold Entresto due to creatinine increasing Continue carvedilol  4.  Dyslipidemia Continue statin  5.WC/VT- on initial EKG found with wide-complex tachycardia On amiodarone, increased to 400 mg twice daily for reload per cards VT presumably ischemic in etiology Status post ICD placement [employer] was called by cardiology to interrogate due to #1   6. DM2- Uncontrolled Last A1c 11.2 Carb controlled diet Insulin RISS  7.A/CKD III-  Renal function worsening Diuretics on hold still  with diuresis.    Held entresto  We will continue monitoring closely, if continues to increase will consult nephrology  DVT prophylaxis: Heparin Code Status: Full Family Communication: None at bedside Disposition Plan: Likely be here 2 more midnight days, cath on Monday        LOS: 2 days   Time spent: 45 minutes with more than 50 percent on [place], MD [facility] Pager [phone]  If 7PM-7AM, please contact night-coverage [email or web address] Password [facility] 01/06/2019, 2:23 PM

## 2019-01-06 NOTE — Progress Notes (Addendum)
Progress Note  Patient Name: Martin Bush Date of Encounter: 01/06/2019  Primary Cardiologist: Christen Butter, MD  Subjective   No CP or SOB  Inpatient Medications    Scheduled Meds: . amiodarone  400 mg Oral BID  . aspirin EC  81 mg Oral Daily  . carvedilol  6.25 mg Oral BID WC  . Chlorhexidine Gluconate Cloth  6 each Topical Daily  . cholecalciferol  2,000 Units Oral BID  . ezetimibe  10 mg Oral QHS  . insulin aspart  0-15 Units Subcutaneous TID WC  . insulin aspart  0-5 Units Subcutaneous QHS  . insulin aspart protamine- aspart  20 Units Subcutaneous BID WC  . isosorbide mononitrate  30 mg Oral Daily  . nitroGLYCERIN  1 inch Topical Q6H  . potassium chloride SA  20 mEq Oral BID  . rosuvastatin  40 mg Oral QHS  . sertraline  50 mg Oral QHS  . sodium chloride flush  3 mL Intravenous Q12H  . ticagrelor  90 mg Oral BID   Continuous Infusions: . sodium chloride    . heparin 1,500 Units/hr (01/06/19 0727)   PRN Meds: sodium chloride, acetaminophen, nitroGLYCERIN, ondansetron (ZOFRAN) IV, sodium chloride flush   Vital Signs    Vitals:   01/05/19 1352 01/05/19 1944 01/06/19 0405 01/06/19 0836  BP: 106/61 118/69 127/75 (!) 112/53  Pulse: 65 66 66 64  Resp: 20 20 20 18   Temp: 98.1 F (36.7 C) 98.3 F (36.8 C) 98.1 F (36.7 C) 97.6 F (36.4 C)  TempSrc: Oral Oral Oral Oral  SpO2: 99% 97% 96% 96%  Weight: 98.5 kg  97.8 kg   Height: 5\' 7"  (1.702 m)       Intake/Output Summary (Last 24 hours) at 01/06/2019 1003 Last data filed at 01/06/2019 0939 Gross per 24 hour  Intake 282 ml  Output 2800 ml  Net -2518 ml   Filed Weights   01/05/19 0500 01/05/19 1352 01/06/19 0405  Weight: 94.9 kg 98.5 kg 97.8 kg    Physical Exam   GEN: Well nourished, well developed, in no acute distress.  HEENT: Grossly normal.  Neck: JVP is normal   Cardiac: RRR, no murmurs, rubs, or gallops. No clubbing, cyanosis, edema.  Radials/DP/PT 2+ and equal bilaterally.  Respiratory:   Respirations regular and unlabored, clear to auscultation bilaterally. GI: Obese, soft, nontender, nondistended, BS + x 4. MS: no deformity or atrophy. Skin: warm and dry, no rash. Neuro:  Strength and sensation are intact. Psych: AAOx3.  Normal affect.  Labs    Chemistry Recent Labs  Lab 01/03/19 2132 01/04/19 0640 01/05/19 0726 01/06/19 0558  NA 134* 136 134* 132*  K 3.9 4.4 3.5 4.2  CL 101 101 98 98  CO2 20* 25 24 24   GLUCOSE 573* 370* 147* 266*  BUN 20 23 34* 48*  CREATININE 1.54* 1.41* 1.75* 1.85*  CALCIUM 9.1 8.9 8.4* 8.7*  PROT 8.0  --   --   --   ALBUMIN 4.3  --   --   --   AST 21  --   --   --   ALT 15  --   --   --   ALKPHOS 94  --   --   --   BILITOT 0.6  --   --   --   GFRNONAA 47* 53* 41* 38*  GFRAA 55* >60 47* 44*  ANIONGAP 13 10 12 10      Hematology Recent Labs  Lab 01/04/19 0640 01/05/19  [id number] 01/06/19 0558  WBC 9.3 9.1 7.8  RBC 4.38 4.14* 4.19*  HGB 13.3 12.6* 12.6*  HCT 40.0 38.3* 36.8*  MCV 91.3 92.5 87.8  MCH 30.4 30.4 30.1  MCHC 33.3 32.9 34.2  RDW 12.8 12.7 12.7  PLT 185 190 180    Cardiac Enzymes  Recent Labs  Lab 01/03/19 2132 01/04/19 0044 01/04/19 1553 01/04/19 1710  TROPONINIHS 39* 2,747* 11,045* 9,144*      BNP Recent Labs  Lab 01/03/19 2133  BNP 1,262.0*      Radiology    CT ANGIO CHEST PE W OR WO CONTRAST  Result Date: 01/04/2019 CLINICAL DATA:  PE suspected, intermediate prob, positive D-dimer. Respiratory distress. Hypoxia. EXAM: CT ANGIOGRAPHY CHEST WITH CONTRAST TECHNIQUE: Multidetector CT imaging of the chest was performed using the standard protocol during bolus administration of intravenous contrast. Multiplanar CT image reconstructions and MIPs were obtained to evaluate the vascular anatomy. CONTRAST:  178mL OMNIPAQUE IOHEXOL 350 MG/ML SOLN COMPARISON:  Chest radiograph earlier this day. No prior chest CT. FINDINGS: Cardiovascular: There are no filling defects within the pulmonary arteries to suggest  pulmonary embolus. Moderate multi chamber cardiomegaly. Left-sided pacemaker with leads in the right atrium and ventricle. Post CABG. Atherosclerosis of the thoracic aorta. Cannot assess for dissection given phase of contrast tailored to pulmonary artery evaluation. There is no pericardial effusion. Mediastinum/Nodes: Shotty mediastinal lymph nodes with 9 mm right lower paratracheal node. There is a 12 mm right hilar node. No visualized thyroid nodule. No esophageal thickening. Lungs/Pleura: Small right and trace left pleural effusion. Adjacent atelectasis in the right lower lobe. Smooth septal thickening with patchy ground-glass opacities within both lungs, slightly more prominent on the right. There is fluid within fissures bilaterally. Mild lower lobe bronchial thickening is likely congestive. No evidence of pulmonary mass. Trachea and mainstem bronchi are patent. Upper Abdomen: Fluid distended stomach without gastric wall thickening. Musculoskeletal: Post median sternotomy. Degenerative change in the spine. There are no acute or suspicious osseous abnormalities. Review of the MIP images confirms the above findings. IMPRESSION: 1. No pulmonary embolus. 2. CHF. Cardiomegaly with small bilateral pleural effusions and pulmonary edema. Ground-glass opacities in both lungs are most consistent with pulmonary edema, an element of superimposed atypical viral pneumonia cannot be excluded on imaging findings alone, but is felt less likely. 3. Shotty mediastinal and right hilar lymph nodes are likely reactive. Aortic Atherosclerosis (ICD10-I70.0). Electronically Signed   By: Keith Rake M.D.   On: 01/04/2019 00:07   DG Chest Portable 1 View  Result Date: 01/03/2019 CLINICAL DATA:  Acute respiratory distress, O2 sat 70-80% upon EMS arrival EXAM: PORTABLE CHEST 1 VIEW COMPARISON:  Radiograph 08/20/2017 FINDINGS: Postsurgical changes related to prior CABG including intact and aligned sternotomy wires and multiple  surgical clips projecting over the mediastinum. Coronary stent overlies the cardiac silhouette. Three lead pacer/defibrillator pack overlies the left chest wall with leads at the apex, right atrium, and coronary sinus. Telemetry leads overlie the chest. Diffuse interstitial and airspace opacities throughout the lungs with some fluid thickening the right minor fissure. There is marked cardiomegaly, increased from comparison. No visible pneumothorax or evidence of left pleural fluid. No acute osseous or soft tissue abnormality. IMPRESSION: Marked cardiomegaly with diffuse interstitial and airspace opacities throughout the lungs. The findings are most compatible with moderate to severe CHF/volume overload. Please note, infection or inflammation could present with similar findings. Electronically Signed   By: Lovena Le M.D.   On: 01/03/2019 22:14    Telemetry  Paced  - Personally Reviewed  Cardiac Studies   2D Echocardiogram 12.10.2020  1. Left ventricular ejection fraction, by visual estimation, is 25 to 30%. The left ventricle has severely decreased function. There is no left ventricular hypertrophy.  2. Definity contrast agent was given IV to delineate the left ventricular endocardial borders.  3. Left ventricular diastolic parameters are consistent with Grade II diastolic dysfunction (pseudonormalization).  4. Mildly dilated left ventricular internal cavity size.  5. The left ventricle demonstrates global hypokinesis.  6. Global right ventricle was not well visualized.The right ventricular size is not well visualized. Right vetricular wall thickness was not assessed.  7. Left atrial size was mildly dilated.  8. TR signal is inadequate for assessing pulmonary artery systolic pressure.  Patient Profile     63 y.o. male with a history of CAD s/p CABG and subsequent LM and LCX/OM interventions, ICM/HFrEF s/p AICD, HTN, HL, DMII, obesity, and OSA on CPAP w/ O2, who was admitted 12/9 in the  setting of acute pulm edema, slow VT, and NSTEMI.  Assessment & Plan    1.  NSTEMI/CAD:  Pt w/ complex history, s/p CABG x 2 w/ interventions to LM/LCX in 07/2017.  Patent LIMA  LAD w/ known occlusion of the VG  dLCX.  Did well until 12/9, developed back pain and abrupt onset of dyspnea, hypoxia.  In ED, he was noted to be in slow WCT/VT, which subsequently converted to sinus.  Hemodynamically stable throughout.  CT chest w/ CHF.  Initially req BiPAP but now on nasal cannula.  HsTrop up to 11,405, indicating primary ischemic event/NSTEMI, likely leading to slow VT and APE. Cr stll increased  No diuretics   REcheck in AM   WIll hydrate Cont heparin, asa, brilinta,  blocker, statin, zetia, nitrate.  Plan to follow creat this weekend and likely pursue cath on Monday @ [facility].  2.  Acute on chronic combined systolic/diastolic CHF/Acute pulmonary edema:  In setting of above.  Much improved after diuresis yesterday - minus  2.1L.   Hold further diruesis as Cr bumped  3.  AKI/CKD III: In setting of diuresis yesterday.  Hold lasix.    4.  Essential HTN:  BP is OK    5. HL: LDL 67. Cont statin rx.  6.  DMII:  Insulin per IM.  A1c recently 11.6.  7.  VT:  Slow VT on initial ECG.  On amio @ home and has ICD.  Call in to [employer] for interrogation to assess onset and burden of slow VT.  Amio inc to 400 BID for now to reload, though VT presumably ischemic in etiology given #1.  Tele with no VT now   Signed, Dorris Carnes, MD  01/06/2019, 10:03 AM    For questions or updates, please contact   Please consult [email or web address] for contact info under Cardiology/STEMI.

## 2019-01-06 NOTE — Progress Notes (Signed)
ANTICOAGULATION CONSULT NOTE   Pharmacy Consult for Heparin Indication: chest pain/ACS  Patient Measurements: Height: 5\' 7"  (170.2 cm) Weight: 215 lb 11.2 oz (97.8 kg) IBW/kg (Calculated) : 66.1 HEPARIN DW (KG): 87.4  Vital Signs: Temp: 98.1 F (36.7 C) (12/12 0405) Temp Source: Oral (12/12 0405) BP: 127/75 (12/12 0405) Pulse Rate: 66 (12/12 0405)  Labs: Recent Labs    01/03/19 2132 01/03/19 2132 01/04/19 0044 01/04/19 0640 01/04/19 1553 01/04/19 1710 01/05/19 0726 01/05/19 1017 01/05/19 1712 01/06/19 0558  HGB 15.2  --   --  13.3  --   --  12.6*  --   --  12.6*  HCT 47.5  --   --  40.0  --   --  38.3*  --   --  36.8*  PLT 312  --   --  185  --   --  190  --   --  180  LABPROT 13.1  --   --   --   --   --   --   --   --   --   INR 1.0  --   --   --   --   --   --   --   --   --   HEPARINUNFRC  --    < >  --   --   --   --   --  0.43 0.34 0.16*  CREATININE 1.54*  --   --  1.41*  --   --  1.75*  --   --  1.85*  TROPONINIHS 39*  --  2,747*  --  11,045* 9,144*  --   --   --   --    < > = values in this interval not displayed.    Estimated Creatinine Clearance: 45.6 mL/min (A) (by C-G formula based on SCr of 1.85 mg/dL (H)).  Medical History: Past Medical History:  Diagnosis Date  . AICD (automatic cardioverter/defibrillator) present    Medtronic  . Arthritis    "thumbs"  (08/02/2017)  . CHF (congestive heart failure) ([place])   . Chronic combined systolic and diastolic heart failure ([place])    a. 08/2017 Echo: EF 20-25%, mod glob HK. Sev distal ant sept, inflat HK. Apical AK. Gr2 DD. Mildly reduced RV fxn.  . Colon polyps   . Coronary artery disease    a. s/p CABG x 2 (LIMA->LAD, VG->OM); b. Multiple PCI's to LM/LCX/OM; c. 07/2017 PTCA of LM/LCX w/ early ISR-->repeat PCI/DES to LM (3.5x20 Synergy DES) and LCX (3.0x20 Synergy DES); d. 07/2018 Relook Cath: LM patent stent, LAD 100ost, LCX patent stent, OM1 99/60, LIMA->LAD ok. VG->dLCX 61 (old).  . Depression   . Encounter  for assessment of implantable cardioverter-defibrillator (ICD) 09/27/2018  . High cholesterol   . Hypertension   . ICD; Biventricular  Medtronic ICD Amplia MRI QWuad CRT-D  in situ 10/29/14 10/29/2014   Remote ICD check 09.23.20:  One 6 beat NSVT. No therapy.  1 SVT episode @ 130 bpm (38 Sec).  There were 23 Vent sense episodes detected for up to 1.1 min/day (AT). Health trends (patient activity, heart rate variability, average heart rates) are stable.Trans-thoracic impedance trends and the OptiVol Fluid Index do no present significant abnormalities. Battery longevity is 4.3 years. RA pacing is 3  . Ischemic cardiomyopathy 09/27/2018  . MI (myocardial infarction) ([place]) 2003  . NSTEMI (non-ST elevated myocardial infarction) ([place]) 07/16/2014  . OSA on CPAP   . Oxygen deficiency   .  Pneumonia 10/2014  . Proteinuria   . Sleep apnea   . 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 amiodarone (PACERONE) 200 MG tablet Take 200 mg by mouth daily.    01/03/2019 at Unknown time  . aspirin EC 81 MG tablet Take 81 mg by mouth daily.   01/03/2019 at Unknown time  . carvedilol (COREG) 12.5 MG tablet Take 6.25 mg by mouth 2 (two) times daily with a meal.   01/03/2019 at Unknown time  . Cholecalciferol (VITAMIN D) 2000 units tablet Take 2,000 Units by mouth 2 (two) times daily.   01/03/2019 at Unknown time  . empagliflozin (JARDIANCE) 25 MG TABS tablet Take 25 mg by mouth daily.   01/03/2019 at Unknown time  . ezetimibe (ZETIA) 10 MG tablet Take 1 tablet (10 mg total) by mouth daily. (Patient taking differently: Take 10 mg by mouth at bedtime. ) 90 tablet 3 01/02/2019 at Unknown time  . insulin aspart protamine - aspart (NOVOLOG MIX 70/30 FLEXPEN) (70-30) 100 UNIT/ML FlexPen Inject 63 Units into the skin 2 (two) times daily as needed (as needed for blood sugar over 200). as needed for blood sugar over 200   01/03/2019  at Unknown time  . isosorbide mononitrate (IMDUR) 30 MG 24 hr tablet Take 30 mg by mouth daily.   01/03/2019 at Unknown time  . liraglutide (VICTOZA) 18 MG/3ML SOPN Inject 1.8 mg into the skin daily.   01/03/2019 at Unknown time  . metFORMIN (GLUCOPHAGE-XR) 500 MG 24 hr tablet Take 500 mg by mouth 2 (two) times daily.    01/03/2019 at Unknown time  . metolazone (ZAROXOLYN) 2.5 MG tablet Take 2.5 mg by mouth daily as needed (for >2 lb weight gain).   prn at prn  . Omega-3 Fatty Acids (FISH OIL) 1000 MG CAPS Take 1,000 mg by mouth 2 (two) times daily.    01/03/2019 at Unknown time  . potassium chloride SA (K-DUR,KLOR-CON) 20 MEQ tablet Take 1 tablet (20 mEq total) by mouth 2 (two) times daily. 60 tablet 3 01/03/2019 at Unknown time  . PRESCRIPTION MEDICATION Inhale into the lungs at bedtime. CPAP   01/02/2019 at Unknown time  . rosuvastatin (CRESTOR) 40 MG tablet Take 40 mg by mouth at bedtime.   01/02/2019 at Unknown time  . sacubitril-valsartan (ENTRESTO) 97-103 MG Take 1 tablet by mouth 2 (two) times daily.    01/03/2019 at Unknown time  . sertraline (ZOLOFT) 100 MG tablet Take 50 mg by mouth at bedtime.    01/02/2019 at Unknown time  . ticagrelor (BRILINTA) 90 MG TABS tablet Take 1 tablet (90 mg total) by mouth 2 (two) times daily. 60 tablet 3 01/03/2019 at Unknown time  . torsemide (DEMADEX) 20 MG tablet Take 1 tablet (20 mg total) by mouth 2 (two) times daily. (Patient taking differently: Take 20 mg by mouth 2 (two) times daily as needed (fluid). ) 180 tablet 3 prn at prn    Assessment: Pharmacy asked to initiate and monitor Heparin for ACS/NSTEMI.   Pt not on anticoagulants per med list.  H&H, PLT trending down from baseline. The plan is to follow creatinine this weekend and likely pursue cath on Monday 12/14   Heparin Course: 12/10 initiation: 4000 units bolus, then 1000 units/hr 12/10 1800 HL 0.41: no change 12/11 0000 HL 0.25: rate inc to 1300 units/hr 12/11 1017 HL 0.43: therapeutic x 1  12/11  1712 HL 0.34: therapeutic x 2 12/12 0558 HL 0.16: subtherapeutic, confirmed w/ RN no issues w/ infusion.  Goal of Therapy:  Heparin level 0.3-0.7 units/ml Monitor platelets by anticoagulation protocol: Yes   Plan:   Heparin level subtherapeutic, will increase heparin to 1500 units/hr, recheck in 8 hours    HL/CBC in am   Ena Dawley, PharmD Clinical Pharmacist 01/06/2019 6:39 AM

## 2019-01-06 NOTE — Progress Notes (Signed)
ANTICOAGULATION CONSULT NOTE   Pharmacy Consult for Heparin Indication: chest pain/ACS  Patient Measurements: Height: 5\' 7"  (170.2 cm) Weight: 215 lb 11.2 oz (97.8 kg) IBW/kg (Calculated) : 66.1 HEPARIN DW (KG): 87.4  Vital Signs: Temp: 97.6 F (36.4 C) (12/12 0836) Temp Source: Oral (12/12 0836) BP: 112/53 (12/12 0836) Pulse Rate: 64 (12/12 0836)  Labs: Recent Labs    01/03/19 2132 01/03/19 2132 01/04/19 0044 01/04/19 0640 01/04/19 1553 01/04/19 1710 01/05/19 0726 01/05/19 1712 01/06/19 0558 01/06/19 1454  HGB 15.2  --   --  13.3  --   --  12.6*  --  12.6*  --   HCT 47.5  --   --  40.0  --   --  38.3*  --  36.8*  --   PLT 312  --   --  185  --   --  190  --  180  --   LABPROT 13.1  --   --   --   --   --   --   --   --   --   INR 1.0  --   --   --   --   --   --   --   --   --   HEPARINUNFRC  --    < >  --   --   --   --   --  0.34 0.16* 0.35  CREATININE 1.54*  --   --  1.41*  --   --  1.75*  --  1.85*  --   TROPONINIHS 39*  --  2,747*  --  11,045* 9,144*  --   --   --   --    < > = values in this interval not displayed.    Estimated Creatinine Clearance: 45.6 mL/min (A) (by C-G formula based on SCr of 1.85 mg/dL (H)).  Medical History: Past Medical History:  Diagnosis Date  . AICD (automatic cardioverter/defibrillator) present    Medtronic  . Arthritis    "thumbs"  (08/02/2017)  . CHF (congestive heart failure) ([place])   . Chronic combined systolic and diastolic heart failure ([place])    a. 08/2017 Echo: EF 20-25%, mod glob HK. Sev distal ant sept, inflat HK. Apical AK. Gr2 DD. Mildly reduced RV fxn.  . Colon polyps   . Coronary artery disease    a. s/p CABG x 2 (LIMA->LAD, VG->OM); b. Multiple PCI's to LM/LCX/OM; c. 07/2017 PTCA of LM/LCX w/ early ISR-->repeat PCI/DES to LM (3.5x20 Synergy DES) and LCX (3.0x20 Synergy DES); d. 07/2018 Relook Cath: LM patent stent, LAD 100ost, LCX patent stent, OM1 99/60, LIMA->LAD ok. VG->dLCX 38 (old).  . Depression   . Encounter  for assessment of implantable cardioverter-defibrillator (ICD) 09/27/2018  . High cholesterol   . Hypertension   . ICD; Biventricular  Medtronic ICD Amplia MRI QWuad CRT-D  in situ 10/29/14 10/29/2014   Remote ICD check 09.23.20:  One 6 beat NSVT. No therapy.  1 SVT episode @ 130 bpm (38 Sec).  There were 23 Vent sense episodes detected for up to 1.1 min/day (AT). Health trends (patient activity, heart rate variability, average heart rates) are stable.Trans-thoracic impedance trends and the OptiVol Fluid Index do no present significant abnormalities. Battery longevity is 4.3 years. RA pacing is 3  . Ischemic cardiomyopathy 09/27/2018  . MI (myocardial infarction) ([place]) 2003  . NSTEMI (non-ST elevated myocardial infarction) ([place]) 07/16/2014  . OSA on CPAP   . Oxygen deficiency   .  Pneumonia 10/2014  . Proteinuria   . Sleep apnea   . 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 amiodarone (PACERONE) 200 MG tablet Take 200 mg by mouth daily.    01/03/2019 at Unknown time  . aspirin EC 81 MG tablet Take 81 mg by mouth daily.   01/03/2019 at Unknown time  . carvedilol (COREG) 12.5 MG tablet Take 6.25 mg by mouth 2 (two) times daily with a meal.   01/03/2019 at Unknown time  . Cholecalciferol (VITAMIN D) 2000 units tablet Take 2,000 Units by mouth 2 (two) times daily.   01/03/2019 at Unknown time  . empagliflozin (JARDIANCE) 25 MG TABS tablet Take 25 mg by mouth daily.   01/03/2019 at Unknown time  . ezetimibe (ZETIA) 10 MG tablet Take 1 tablet (10 mg total) by mouth daily. (Patient taking differently: Take 10 mg by mouth at bedtime. ) 90 tablet 3 01/02/2019 at Unknown time  . insulin aspart protamine - aspart (NOVOLOG MIX 70/30 FLEXPEN) (70-30) 100 UNIT/ML FlexPen Inject 63 Units into the skin 2 (two) times daily as needed (as needed for blood sugar over 200). as needed for blood sugar over 200   01/03/2019  at Unknown time  . isosorbide mononitrate (IMDUR) 30 MG 24 hr tablet Take 30 mg by mouth daily.   01/03/2019 at Unknown time  . liraglutide (VICTOZA) 18 MG/3ML SOPN Inject 1.8 mg into the skin daily.   01/03/2019 at Unknown time  . metFORMIN (GLUCOPHAGE-XR) 500 MG 24 hr tablet Take 500 mg by mouth 2 (two) times daily.    01/03/2019 at Unknown time  . metolazone (ZAROXOLYN) 2.5 MG tablet Take 2.5 mg by mouth daily as needed (for >2 lb weight gain).   prn at prn  . Omega-3 Fatty Acids (FISH OIL) 1000 MG CAPS Take 1,000 mg by mouth 2 (two) times daily.    01/03/2019 at Unknown time  . potassium chloride SA (K-DUR,KLOR-CON) 20 MEQ tablet Take 1 tablet (20 mEq total) by mouth 2 (two) times daily. 60 tablet 3 01/03/2019 at Unknown time  . PRESCRIPTION MEDICATION Inhale into the lungs at bedtime. CPAP   01/02/2019 at Unknown time  . rosuvastatin (CRESTOR) 40 MG tablet Take 40 mg by mouth at bedtime.   01/02/2019 at Unknown time  . sacubitril-valsartan (ENTRESTO) 97-103 MG Take 1 tablet by mouth 2 (two) times daily.    01/03/2019 at Unknown time  . sertraline (ZOLOFT) 100 MG tablet Take 50 mg by mouth at bedtime.    01/02/2019 at Unknown time  . ticagrelor (BRILINTA) 90 MG TABS tablet Take 1 tablet (90 mg total) by mouth 2 (two) times daily. 60 tablet 3 01/03/2019 at Unknown time  . torsemide (DEMADEX) 20 MG tablet Take 1 tablet (20 mg total) by mouth 2 (two) times daily. (Patient taking differently: Take 20 mg by mouth 2 (two) times daily as needed (fluid). ) 180 tablet 3 prn at prn    Assessment: Pharmacy asked to initiate and monitor Heparin for ACS/NSTEMI.   Pt not on anticoagulants per med list.  H&H, PLT trending down from baseline. The plan is to follow creatinine this weekend and likely pursue cath on Monday 12/14   Heparin Course: 12/10 initiation: 4000 units bolus, then 1000 units/hr 12/10 1800 HL 0.41: no change 12/11 0000 HL 0.25: rate inc to 1300 units/hr 12/11 1017 HL 0.43: therapeutic x 1  12/11  1712 HL 0.34: therapeutic x 2 12/12 0558 HL 0.16: subtherapeutic, confirmed w/ RN no issues w/ infusion. 12/12 1454 HL 0.35 therapeutic x 1 @ 1500 units/hr  Goal of Therapy:  Heparin level 0.3-0.7 units/ml Monitor platelets by anticoagulation protocol: Yes   Plan:   Heparin level therapeutic x 1, will continue 1500 units/hr, recheck in 8 hours to confirm    HL/CBC in am   Lu Duffel, PharmD Clinical Pharmacist 01/06/2019 4:11 PM

## 2019-01-07 LAB — BASIC METABOLIC PANEL
Anion gap: 9 (ref 5–15)
BUN: 45 mg/dL — ABNORMAL HIGH (ref 8–23)
CO2: 23 mmol/L (ref 22–32)
Calcium: 9 mg/dL (ref 8.9–10.3)
Chloride: 101 mmol/L (ref 98–111)
Creatinine, Ser: 1.5 mg/dL — ABNORMAL HIGH (ref 0.61–1.24)
GFR calc Af Amer: 57 mL/min — ABNORMAL LOW (ref >60)
GFR calc non Af Amer: 49 mL/min — ABNORMAL LOW (ref >60)
Glucose, Bld: 164 mg/dL — ABNORMAL HIGH (ref 70–99)
Potassium: 4.5 mmol/L (ref 3.5–5.1)
Sodium: 133 mmol/L — ABNORMAL LOW (ref 135–145)

## 2019-01-07 LAB — CBC
HCT: 35 % — ABNORMAL LOW (ref 39.0–52.0)
Hemoglobin: 12.1 g/dL — ABNORMAL LOW (ref 13.0–17.0)
MCH: 30.2 pg (ref 26.0–34.0)
MCHC: 34.6 g/dL (ref 30.0–36.0)
MCV: 87.3 fL (ref 80.0–100.0)
Platelets: 189 10*3/uL (ref 150–400)
RBC: 4.01 MIL/uL — ABNORMAL LOW (ref 4.22–5.81)
RDW: 12.7 % (ref 11.5–15.5)
WBC: 7.5 10*3/uL (ref 4.0–10.5)
nRBC: 0 % (ref 0.0–0.2)

## 2019-01-07 LAB — GLUCOSE, CAPILLARY
Glucose-Capillary: 164 mg/dL — ABNORMAL HIGH (ref 70–99)
Glucose-Capillary: 273 mg/dL — ABNORMAL HIGH (ref 70–99)
Glucose-Capillary: 225 mg/dL — ABNORMAL HIGH (ref 70–99)
Glucose-Capillary: 215 mg/dL — ABNORMAL HIGH (ref 70–99)

## 2019-01-07 LAB — HEPARIN LEVEL (UNFRACTIONATED): Heparin Unfractionated: 0.36 IU/mL (ref 0.30–0.70)

## 2019-01-07 NOTE — H&P (View-Only) (Signed)
Progress Note  Patient Name: Martin Bush Date of Encounter: 01/07/2019  Primary Cardiologist: Christen Butter, MD  Subjective   Breathing is OK  NO CP    Inpatient Medications    Scheduled Meds: . amiodarone  400 mg Oral BID  . aspirin EC  81 mg Oral Daily  . carvedilol  6.25 mg Oral BID WC  . Chlorhexidine Gluconate Cloth  6 each Topical Daily  . cholecalciferol  2,000 Units Oral BID  . ezetimibe  10 mg Oral QHS  . insulin aspart  0-15 Units Subcutaneous TID WC  . insulin aspart  0-5 Units Subcutaneous QHS  . insulin aspart protamine- aspart  25 Units Subcutaneous BID WC  . isosorbide mononitrate  30 mg Oral Daily  . nitroGLYCERIN  1 inch Topical Q6H  . potassium chloride SA  20 mEq Oral BID  . rosuvastatin  40 mg Oral QHS  . sertraline  50 mg Oral QHS  . sodium chloride flush  3 mL Intravenous Q12H  . ticagrelor  90 mg Oral BID   Continuous Infusions: . sodium chloride    . heparin 1,500 Units/hr (01/06/19 1502)   PRN Meds: sodium chloride, acetaminophen, nitroGLYCERIN, ondansetron (ZOFRAN) IV, sodium chloride flush   Vital Signs    Vitals:   01/06/19 1622 01/06/19 2029 01/07/19 0346 01/07/19 0746  BP: 120/67 (!) 108/59 127/75 134/74  Pulse: 62 60 62 61  Resp: 18 18 16 16   Temp: 98.2 F (36.8 C) 98.3 F (36.8 C) 98.4 F (36.9 C) 97.9 F (36.6 C)  TempSrc: Oral Oral Oral Oral  SpO2: 98% 97% 96% 96%  Weight:   96.2 kg   Height:        Intake/Output Summary (Last 24 hours) at 01/07/2019 0937 Last data filed at 01/07/2019 0349 Gross per 24 hour  Intake 243 ml  Output 1775 ml  Net -1532 ml   Net Neg 8.26L  Filed Weights   01/05/19 1352 01/06/19 0405 01/07/19 0346  Weight: 98.5 kg 97.8 kg 96.2 kg    Physical Exam   GEN:  Pt is in no acute distress.  HEENT: Grossly normal.  Neck: JVP is normal   Cardiac: RRR, no murmurs, rubs, or gallops. LE with no edema Respiratory:  Clear bilaterally  GI: Obese, soft, nontender, nondistended, BS + x 4. MS: no  deformity or atrophy. Skin: warm and dry, no rash. Neuro:  Strength and sensation are intact. Psych: AAOx3.  Normal affect.  Labs    Chemistry Recent Labs  Lab 01/03/19 2132 01/05/19 0726 01/06/19 0558 01/07/19 0443  NA 134* 134* 132* 133*  K 3.9 3.5 4.2 4.5  CL 101 98 98 101  CO2 20* 24 24 23   GLUCOSE 573* 147* 266* 164*  BUN 20 34* 48* 45*  CREATININE 1.54* 1.75* 1.85* 1.50*  CALCIUM 9.1 8.4* 8.7* 9.0  PROT 8.0  --   --   --   ALBUMIN 4.3  --   --   --   AST 21  --   --   --   ALT 15  --   --   --   ALKPHOS 94  --   --   --   BILITOT 0.6  --   --   --   GFRNONAA 47* 41* 38* 49*  GFRAA 55* 47* 44* 57*  ANIONGAP 13 12 10 9      Hematology Recent Labs  Lab 01/05/19 0726 01/06/19 0558 01/07/19 0443  WBC 9.1 7.8 7.5  RBC 4.14* 4.19* 4.01*  HGB 12.6* 12.6* 12.1*  HCT 38.3* 36.8* 35.0*  MCV 92.5 87.8 87.3  MCH 30.4 30.1 30.2  MCHC 32.9 34.2 34.6  RDW 12.7 12.7 12.7  PLT 190 180 189    Cardiac Enzymes  Recent Labs  Lab 01/03/19 2132 01/04/19 0044 01/04/19 1553 01/04/19 1710  TROPONINIHS 39* 2,747* 11,045* 9,144*      BNP Recent Labs  Lab 01/03/19 2133  BNP 1,262.0*      Radiology    CT ANGIO CHEST PE W OR WO CONTRAST  Result Date: 01/04/2019 CLINICAL DATA:  PE suspected, intermediate prob, positive D-dimer. Respiratory distress. Hypoxia. EXAM: CT ANGIOGRAPHY CHEST WITH CONTRAST TECHNIQUE: Multidetector CT imaging of the chest was performed using the standard protocol during bolus administration of intravenous contrast. Multiplanar CT image reconstructions and MIPs were obtained to evaluate the vascular anatomy. CONTRAST:  17mL OMNIPAQUE IOHEXOL 350 MG/ML SOLN COMPARISON:  Chest radiograph earlier this day. No prior chest CT. FINDINGS: Cardiovascular: There are no filling defects within the pulmonary arteries to suggest pulmonary embolus. Moderate multi chamber cardiomegaly. Left-sided pacemaker with leads in the right atrium and ventricle. Post CABG.  Atherosclerosis of the thoracic aorta. Cannot assess for dissection given phase of contrast tailored to pulmonary artery evaluation. There is no pericardial effusion. Mediastinum/Nodes: Shotty mediastinal lymph nodes with 9 mm right lower paratracheal node. There is a 12 mm right hilar node. No visualized thyroid nodule. No esophageal thickening. Lungs/Pleura: Small right and trace left pleural effusion. Adjacent atelectasis in the right lower lobe. Smooth septal thickening with patchy ground-glass opacities within both lungs, slightly more prominent on the right. There is fluid within fissures bilaterally. Mild lower lobe bronchial thickening is likely congestive. No evidence of pulmonary mass. Trachea and mainstem bronchi are patent. Upper Abdomen: Fluid distended stomach without gastric wall thickening. Musculoskeletal: Post median sternotomy. Degenerative change in the spine. There are no acute or suspicious osseous abnormalities. Review of the MIP images confirms the above findings. IMPRESSION: 1. No pulmonary embolus. 2. CHF. Cardiomegaly with small bilateral pleural effusions and pulmonary edema. Ground-glass opacities in both lungs are most consistent with pulmonary edema, an element of superimposed atypical viral pneumonia cannot be excluded on imaging findings alone, but is felt less likely. 3. Shotty mediastinal and right hilar lymph nodes are likely reactive. Aortic Atherosclerosis (ICD10-I70.0). Electronically Signed   By: Keith Rake M.D.   On: 01/04/2019 00:07   DG Chest Portable 1 View  Result Date: 01/03/2019 CLINICAL DATA:  Acute respiratory distress, O2 sat 70-80% upon EMS arrival EXAM: PORTABLE CHEST 1 VIEW COMPARISON:  Radiograph 08/20/2017 FINDINGS: Postsurgical changes related to prior CABG including intact and aligned sternotomy wires and multiple surgical clips projecting over the mediastinum. Coronary stent overlies the cardiac silhouette. Three lead pacer/defibrillator pack  overlies the left chest wall with leads at the apex, right atrium, and coronary sinus. Telemetry leads overlie the chest. Diffuse interstitial and airspace opacities throughout the lungs with some fluid thickening the right minor fissure. There is marked cardiomegaly, increased from comparison. No visible pneumothorax or evidence of left pleural fluid. No acute osseous or soft tissue abnormality. IMPRESSION: Marked cardiomegaly with diffuse interstitial and airspace opacities throughout the lungs. The findings are most compatible with moderate to severe CHF/volume overload. Please note, infection or inflammation could present with similar findings. Electronically Signed   By: Lovena Le M.D.   On: 01/03/2019 22:14    Telemetry    Paced rhythm  - Personally Reviewed  Cardiac  Studies   2D Echocardiogram 12.10.2020  1. Left ventricular ejection fraction, by visual estimation, is 25 to 30%. The left ventricle has severely decreased function. There is no left ventricular hypertrophy.  2. Definity contrast agent was given IV to delineate the left ventricular endocardial borders.  3. Left ventricular diastolic parameters are consistent with Grade II diastolic dysfunction (pseudonormalization).  4. Mildly dilated left ventricular internal cavity size.  5. The left ventricle demonstrates global hypokinesis.  6. Global right ventricle was not well visualized.The right ventricular size is not well visualized. Right vetricular wall thickness was not assessed.  7. Left atrial size was mildly dilated.  8. TR signal is inadequate for assessing pulmonary artery systolic pressure.  Patient Profile     63 y.o. male with a history of CAD s/p CABG and subsequent LM and LCX/OM interventions, ICM/HFrEF s/p AICD, HTN, HL, DMII, obesity, and OSA on CPAP w/ O2, who was admitted 12/9 in the setting of acute pulm edema, slow VT, and NSTEMI.  Assessment & Plan    1.  NSTEMI/CAD:  Pt w/ complex history, s/p CABG x 2  w/ interventions to LM/LCX in 07/2017.  Patent LIMA  LAD w/ known occlusion of the VG  dLCX.  Did well until 12/9, developed back pain and abrupt onset of dyspnea, hypoxia.  In ED, he was noted to be in slow WCT/VT, which subsequently converted to sinus.  Hemodynamically stable throughout.  CT chest w/ CHF.  Initially req BiPAP but now on nasal cannula.  HsTrop up to 11,405, indicating primary ischemic event/NSTEMI, likely leading to slow VT and APE. Cr stll increased  No diuretics   REcheck in AM   WIll hydrate Cont heparin, asa, brilinta,  blocker, statin, zetia, nitrate.   Plan for L heart cath tommorow   Hydrate tonight  2.  Acute on chronic combined systolic/diastolic CHF/Acute pulmonary edema:    VOlume status is pretty good  Cre has improved   Will give some fluid tonight to help with renal function   3.  AKI/CKD III:  Cr is 1.5 today   Will hydrate before cath    4.  Essential HTN:  BP is OK    5. HL: LDL 67. Cont statin and Zetia rx.  6.  DMII:  Insulin per IM.  A1c recently 11.6.  7.  VT:  Slow VT on initial ECG.  On amio @ home and has ICD.  Call in to [employer] for interrogation to assess onset and burden of slow VT.  Amio has been increased  to 400 BID while here , though VT presumably ischemic in etiology given #1.  No recurrence    Signed, Dorris Carnes, MD  01/07/2019, 9:37 AM    For questions or updates, please contact   Please consult [email or web address] for contact info under Cardiology/STEMI.

## 2019-01-07 NOTE — Progress Notes (Signed)
ANTICOAGULATION CONSULT NOTE   Pharmacy Consult for Heparin Indication: chest pain/ACS  Patient Measurements: Height: 5\' 7"  (170.2 cm) Weight: 212 lb 1.6 oz (96.2 kg) IBW/kg (Calculated) : 66.1 HEPARIN DW (KG): 87.4  Vital Signs: Temp: 98.4 F (36.9 C) (12/13 0346) Temp Source: Oral (12/13 0346) BP: 127/75 (12/13 0346) Pulse Rate: 62 (12/13 0346)  Labs: Recent Labs    01/04/19 1553 01/04/19 1710 01/05/19 0726 01/06/19 0558 01/06/19 1454 01/06/19 2246 01/07/19 0443  HGB  --   --  12.6* 12.6*  --   --  12.1*  HCT  --   --  38.3* 36.8*  --   --  35.0*  PLT  --   --  190 180  --   --  189  HEPARINUNFRC  --   --   --  0.16* 0.35 0.37 0.36  CREATININE  --   --  1.75* 1.85*  --   --  1.50*  TROPONINIHS 11,045* 9,144*  --   --   --   --   --     Estimated Creatinine Clearance: 55.7 mL/min (A) (by C-G formula based on SCr of 1.5 mg/dL (H)).  Medical History: Past Medical History:  Diagnosis Date  . AICD (automatic cardioverter/defibrillator) present    Medtronic  . Arthritis    "thumbs"  (08/02/2017)  . CHF (congestive heart failure) ([place])   . Chronic combined systolic and diastolic heart failure ([place])    a. 08/2017 Echo: EF 20-25%, mod glob HK. Sev distal ant sept, inflat HK. Apical AK. Gr2 DD. Mildly reduced RV fxn.  . Colon polyps   . Coronary artery disease    a. s/p CABG x 2 (LIMA->LAD, VG->OM); b. Multiple PCI's to LM/LCX/OM; c. 07/2017 PTCA of LM/LCX w/ early ISR-->repeat PCI/DES to LM (3.5x20 Synergy DES) and LCX (3.0x20 Synergy DES); d. 07/2018 Relook Cath: LM patent stent, LAD 100ost, LCX patent stent, OM1 99/60, LIMA->LAD ok. VG->dLCX 68 (old).  . Depression   . Encounter for assessment of implantable cardioverter-defibrillator (ICD) 09/27/2018  . High cholesterol   . Hypertension   . ICD; Biventricular  Medtronic ICD Amplia MRI QWuad CRT-D  in situ 10/29/14 10/29/2014   Remote ICD check 09.23.20:  One 6 beat NSVT. No therapy.  1 SVT episode @ 130 bpm (38 Sec).   There were 23 Vent sense episodes detected for up to 1.1 min/day (AT). Health trends (patient activity, heart rate variability, average heart rates) are stable.Trans-thoracic impedance trends and the OptiVol Fluid Index do no present significant abnormalities. Battery longevity is 4.3 years. RA pacing is 3  . Ischemic cardiomyopathy 09/27/2018  . MI (myocardial infarction) ([place]) 2003  . NSTEMI (non-ST elevated myocardial infarction) ([place]) 07/16/2014  . OSA on CPAP   . Oxygen deficiency   . Pneumonia 10/2014  . Proteinuria   . Sleep apnea   . 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 amiodarone (PACERONE) 200 MG tablet Take 200 mg by mouth daily.    01/03/2019 at Unknown time  . aspirin EC 81 MG tablet Take 81 mg by mouth daily.   01/03/2019 at Unknown time  . carvedilol (COREG) 12.5 MG tablet Take 6.25 mg by mouth 2 (two) times daily with a meal.   01/03/2019 at Unknown time  . Cholecalciferol (VITAMIN D) 2000 units tablet Take 2,000 Units by mouth 2 (two) times daily.  01/03/2019 at Unknown time  . empagliflozin (JARDIANCE) 25 MG TABS tablet Take 25 mg by mouth daily.   01/03/2019 at Unknown time  . ezetimibe (ZETIA) 10 MG tablet Take 1 tablet (10 mg total) by mouth daily. (Patient taking differently: Take 10 mg by mouth at bedtime. ) 90 tablet 3 01/02/2019 at Unknown time  . insulin aspart protamine - aspart (NOVOLOG MIX 70/30 FLEXPEN) (70-30) 100 UNIT/ML FlexPen Inject 63 Units into the skin 2 (two) times daily as needed (as needed for blood sugar over 200). as needed for blood sugar over 200   01/03/2019 at Unknown time  . isosorbide mononitrate (IMDUR) 30 MG 24 hr tablet Take 30 mg by mouth daily.   01/03/2019 at Unknown time  . liraglutide (VICTOZA) 18 MG/3ML SOPN Inject 1.8 mg into the skin daily.   01/03/2019 at Unknown time  . metFORMIN (GLUCOPHAGE-XR) 500 MG 24 hr tablet Take 500 mg by  mouth 2 (two) times daily.    01/03/2019 at Unknown time  . metolazone (ZAROXOLYN) 2.5 MG tablet Take 2.5 mg by mouth daily as needed (for >2 lb weight gain).   prn at prn  . Omega-3 Fatty Acids (FISH OIL) 1000 MG CAPS Take 1,000 mg by mouth 2 (two) times daily.    01/03/2019 at Unknown time  . potassium chloride SA (K-DUR,KLOR-CON) 20 MEQ tablet Take 1 tablet (20 mEq total) by mouth 2 (two) times daily. 60 tablet 3 01/03/2019 at Unknown time  . PRESCRIPTION MEDICATION Inhale into the lungs at bedtime. CPAP   01/02/2019 at Unknown time  . rosuvastatin (CRESTOR) 40 MG tablet Take 40 mg by mouth at bedtime.   01/02/2019 at Unknown time  . sacubitril-valsartan (ENTRESTO) 97-103 MG Take 1 tablet by mouth 2 (two) times daily.    01/03/2019 at Unknown time  . sertraline (ZOLOFT) 100 MG tablet Take 50 mg by mouth at bedtime.    01/02/2019 at Unknown time  . ticagrelor (BRILINTA) 90 MG TABS tablet Take 1 tablet (90 mg total) by mouth 2 (two) times daily. 60 tablet 3 01/03/2019 at Unknown time  . torsemide (DEMADEX) 20 MG tablet Take 1 tablet (20 mg total) by mouth 2 (two) times daily. (Patient taking differently: Take 20 mg by mouth 2 (two) times daily as needed (fluid). ) 180 tablet 3 prn at prn    Assessment: Pharmacy asked to initiate and monitor Heparin for ACS/NSTEMI.   Pt not on anticoagulants per med list.  H&H, PLT trending down from baseline. The plan is to follow creatinine this weekend and likely pursue cath on Monday 12/14   Heparin Course: 12/10 initiation: 4000 units bolus, then 1000 units/hr 12/10 1800 HL 0.41: no change 12/11 0000 HL 0.25: rate inc to 1300 units/hr 12/11 1017 HL 0.43: therapeutic x 1 12/11 1712 HL 0.34: therapeutic x 2 12/12 0558 HL 0.16: subtherapeutic, confirmed w/ RN no issues w/ infusion. 12/12 1454 HL 0.35 therapeutic x 1 @ 1500 units/hr 12/12 2246 HL 0.37 therapeutic x 2 @ 1500 units/hr 12/13 0443 HL 0.36 therapeutic x 3 @ 1500 units/hr, CBC stable.  Goal of Therapy:   Heparin level 0.3-0.7 units/ml Monitor platelets by anticoagulation protocol: Yes   Plan:   Heparin level therapeutic x 3, will continue 1500 units/hr, recheck qam    HL/CBC in am   Ena Dawley, PharmD Clinical Pharmacist 01/07/2019 6:35 AM

## 2019-01-07 NOTE — Progress Notes (Signed)
Progress Note  Patient Name: Martin Bush Date of Encounter: 01/07/2019  Primary Cardiologist: Christen Butter, MD  Subjective   Breathing is OK  NO CP    Inpatient Medications    Scheduled Meds: . amiodarone  400 mg Oral BID  . aspirin EC  81 mg Oral Daily  . carvedilol  6.25 mg Oral BID WC  . Chlorhexidine Gluconate Cloth  6 each Topical Daily  . cholecalciferol  2,000 Units Oral BID  . ezetimibe  10 mg Oral QHS  . insulin aspart  0-15 Units Subcutaneous TID WC  . insulin aspart  0-5 Units Subcutaneous QHS  . insulin aspart protamine- aspart  25 Units Subcutaneous BID WC  . isosorbide mononitrate  30 mg Oral Daily  . nitroGLYCERIN  1 inch Topical Q6H  . potassium chloride SA  20 mEq Oral BID  . rosuvastatin  40 mg Oral QHS  . sertraline  50 mg Oral QHS  . sodium chloride flush  3 mL Intravenous Q12H  . ticagrelor  90 mg Oral BID   Continuous Infusions: . sodium chloride    . heparin 1,500 Units/hr (01/06/19 1502)   PRN Meds: sodium chloride, acetaminophen, nitroGLYCERIN, ondansetron (ZOFRAN) IV, sodium chloride flush   Vital Signs    Vitals:   01/06/19 1622 01/06/19 2029 01/07/19 0346 01/07/19 0746  BP: 120/67 (!) 108/59 127/75 134/74  Pulse: 62 60 62 61  Resp: 18 18 16 16   Temp: 98.2 F (36.8 C) 98.3 F (36.8 C) 98.4 F (36.9 C) 97.9 F (36.6 C)  TempSrc: Oral Oral Oral Oral  SpO2: 98% 97% 96% 96%  Weight:   96.2 kg   Height:        Intake/Output Summary (Last 24 hours) at 01/07/2019 0937 Last data filed at 01/07/2019 0349 Gross per 24 hour  Intake 243 ml  Output 1775 ml  Net -1532 ml   Net Neg 8.26L  Filed Weights   01/05/19 1352 01/06/19 0405 01/07/19 0346  Weight: 98.5 kg 97.8 kg 96.2 kg    Physical Exam   GEN:  Pt is in no acute distress.  HEENT: Grossly normal.  Neck: JVP is normal   Cardiac: RRR, no murmurs, rubs, or gallops. LE with no edema Respiratory:  Clear bilaterally  GI: Obese, soft, nontender, nondistended, BS + x 4. MS: no  deformity or atrophy. Skin: warm and dry, no rash. Neuro:  Strength and sensation are intact. Psych: AAOx3.  Normal affect.  Labs    Chemistry Recent Labs  Lab 01/03/19 2132 01/05/19 0726 01/06/19 0558 01/07/19 0443  NA 134* 134* 132* 133*  K 3.9 3.5 4.2 4.5  CL 101 98 98 101  CO2 20* 24 24 23   GLUCOSE 573* 147* 266* 164*  BUN 20 34* 48* 45*  CREATININE 1.54* 1.75* 1.85* 1.50*  CALCIUM 9.1 8.4* 8.7* 9.0  PROT 8.0  --   --   --   ALBUMIN 4.3  --   --   --   AST 21  --   --   --   ALT 15  --   --   --   ALKPHOS 94  --   --   --   BILITOT 0.6  --   --   --   GFRNONAA 47* 41* 38* 49*  GFRAA 55* 47* 44* 57*  ANIONGAP 13 12 10 9      Hematology Recent Labs  Lab 01/05/19 0726 01/06/19 0558 01/07/19 0443  WBC 9.1 7.8 7.5  RBC 4.14* 4.19* 4.01*  HGB 12.6* 12.6* 12.1*  HCT 38.3* 36.8* 35.0*  MCV 92.5 87.8 87.3  MCH 30.4 30.1 30.2  MCHC 32.9 34.2 34.6  RDW 12.7 12.7 12.7  PLT 190 180 189    Cardiac Enzymes  Recent Labs  Lab 01/03/19 2132 01/04/19 0044 01/04/19 1553 01/04/19 1710  TROPONINIHS 39* 2,747* 11,045* 9,144*      BNP Recent Labs  Lab 01/03/19 2133  BNP 1,262.0*      Radiology    CT ANGIO CHEST PE W OR WO CONTRAST  Result Date: 01/04/2019 CLINICAL DATA:  PE suspected, intermediate prob, positive D-dimer. Respiratory distress. Hypoxia. EXAM: CT ANGIOGRAPHY CHEST WITH CONTRAST TECHNIQUE: Multidetector CT imaging of the chest was performed using the standard protocol during bolus administration of intravenous contrast. Multiplanar CT image reconstructions and MIPs were obtained to evaluate the vascular anatomy. CONTRAST:  136mL OMNIPAQUE IOHEXOL 350 MG/ML SOLN COMPARISON:  Chest radiograph earlier this day. No prior chest CT. FINDINGS: Cardiovascular: There are no filling defects within the pulmonary arteries to suggest pulmonary embolus. Moderate multi chamber cardiomegaly. Left-sided pacemaker with leads in the right atrium and ventricle. Post CABG.  Atherosclerosis of the thoracic aorta. Cannot assess for dissection given phase of contrast tailored to pulmonary artery evaluation. There is no pericardial effusion. Mediastinum/Nodes: Shotty mediastinal lymph nodes with 9 mm right lower paratracheal node. There is a 12 mm right hilar node. No visualized thyroid nodule. No esophageal thickening. Lungs/Pleura: Small right and trace left pleural effusion. Adjacent atelectasis in the right lower lobe. Smooth septal thickening with patchy ground-glass opacities within both lungs, slightly more prominent on the right. There is fluid within fissures bilaterally. Mild lower lobe bronchial thickening is likely congestive. No evidence of pulmonary mass. Trachea and mainstem bronchi are patent. Upper Abdomen: Fluid distended stomach without gastric wall thickening. Musculoskeletal: Post median sternotomy. Degenerative change in the spine. There are no acute or suspicious osseous abnormalities. Review of the MIP images confirms the above findings. IMPRESSION: 1. No pulmonary embolus. 2. CHF. Cardiomegaly with small bilateral pleural effusions and pulmonary edema. Ground-glass opacities in both lungs are most consistent with pulmonary edema, an element of superimposed atypical viral pneumonia cannot be excluded on imaging findings alone, but is felt less likely. 3. Shotty mediastinal and right hilar lymph nodes are likely reactive. Aortic Atherosclerosis (ICD10-I70.0). Electronically Signed   By: Keith Rake M.D.   On: 01/04/2019 00:07   DG Chest Portable 1 View  Result Date: 01/03/2019 CLINICAL DATA:  Acute respiratory distress, O2 sat 70-80% upon EMS arrival EXAM: PORTABLE CHEST 1 VIEW COMPARISON:  Radiograph 08/20/2017 FINDINGS: Postsurgical changes related to prior CABG including intact and aligned sternotomy wires and multiple surgical clips projecting over the mediastinum. Coronary stent overlies the cardiac silhouette. Three lead pacer/defibrillator pack  overlies the left chest wall with leads at the apex, right atrium, and coronary sinus. Telemetry leads overlie the chest. Diffuse interstitial and airspace opacities throughout the lungs with some fluid thickening the right minor fissure. There is marked cardiomegaly, increased from comparison. No visible pneumothorax or evidence of left pleural fluid. No acute osseous or soft tissue abnormality. IMPRESSION: Marked cardiomegaly with diffuse interstitial and airspace opacities throughout the lungs. The findings are most compatible with moderate to severe CHF/volume overload. Please note, infection or inflammation could present with similar findings. Electronically Signed   By: Lovena Le M.D.   On: 01/03/2019 22:14    Telemetry    Paced rhythm  - Personally Reviewed  Cardiac  Studies   2D Echocardiogram 12.10.2020  1. Left ventricular ejection fraction, by visual estimation, is 25 to 30%. The left ventricle has severely decreased function. There is no left ventricular hypertrophy.  2. Definity contrast agent was given IV to delineate the left ventricular endocardial borders.  3. Left ventricular diastolic parameters are consistent with Grade II diastolic dysfunction (pseudonormalization).  4. Mildly dilated left ventricular internal cavity size.  5. The left ventricle demonstrates global hypokinesis.  6. Global right ventricle was not well visualized.The right ventricular size is not well visualized. Right vetricular wall thickness was not assessed.  7. Left atrial size was mildly dilated.  8. TR signal is inadequate for assessing pulmonary artery systolic pressure.  Patient Profile     63 y.o. male with a history of CAD s/p CABG and subsequent LM and LCX/OM interventions, ICM/HFrEF s/p AICD, HTN, HL, DMII, obesity, and OSA on CPAP w/ O2, who was admitted 12/9 in the setting of acute pulm edema, slow VT, and NSTEMI.  Assessment & Plan    1.  NSTEMI/CAD:  Pt w/ complex history, s/p CABG x 2  w/ interventions to LM/LCX in 07/2017.  Patent LIMA  LAD w/ known occlusion of the VG  dLCX.  Did well until 12/9, developed back pain and abrupt onset of dyspnea, hypoxia.  In ED, he was noted to be in slow WCT/VT, which subsequently converted to sinus.  Hemodynamically stable throughout.  CT chest w/ CHF.  Initially req BiPAP but now on nasal cannula.  HsTrop up to 11,405, indicating primary ischemic event/NSTEMI, likely leading to slow VT and APE. Cr stll increased  No diuretics   REcheck in AM   WIll hydrate Cont heparin, asa, brilinta,  blocker, statin, zetia, nitrate.   Plan for L heart cath tommorow   Hydrate tonight  2.  Acute on chronic combined systolic/diastolic CHF/Acute pulmonary edema:    VOlume status is pretty good  Cre has improved   Will give some fluid tonight to help with renal function   3.  AKI/CKD III:  Cr is 1.5 today   Will hydrate before cath    4.  Essential HTN:  BP is OK    5. HL: LDL 67. Cont statin and Zetia rx.  6.  DMII:  Insulin per IM.  A1c recently 11.6.  7.  VT:  Slow VT on initial ECG.  On amio @ home and has ICD.  Call in to [employer] for interrogation to assess onset and burden of slow VT.  Amio has been increased  to 400 BID while here , though VT presumably ischemic in etiology given #1.  No recurrence    Signed, Dorris Carnes, MD  01/07/2019, 9:37 AM    For questions or updates, please contact   Please consult [email or web address] for contact info under Cardiology/STEMI.

## 2019-01-07 NOTE — Progress Notes (Signed)
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 NP was elevated at 1200 and initial troponin was elevated at 39. Cell count was elevated at 15,000. Peak troponin after 2 hours is 2700.CTA chest showed no PE but did show bilateral pleural effusions and pulmonary edema. Cardiology was consulted.  Consultants:   Cardiology  Procedures: CT, echo  Antimicrobials:   None  Subjective: Denies shortness of breath, chest pain, dizziness or any other symptoms.  Objective: Vitals:   01/06/19 1622 01/06/19 2029 01/07/19 0346 01/07/19 0746  BP: 120/67 (!) 108/59 127/75 134/74  Pulse: 62 60 62 61  Resp: 18 18 16 16   Temp: 98.2 F (36.8 C) 98.3 F (36.8 C) 98.4 F (36.9 C) 97.9 F (36.6 C)  TempSrc: Oral Oral Oral Oral  SpO2: 98% 97% 96% 96%  Weight:   96.2 kg   Height:        Intake/Output Summary (Last 24 hours) at 01/07/2019 1408 Last data filed at 01/07/2019 1035 Gross per 24 hour  Intake 240 ml  Output 1975 ml  Net -1735 ml   Filed Weights   01/05/19 1352 01/06/19 0405 01/07/19 0346  Weight: 98.5 kg 97.8 kg 96.2 kg    Examination:  General exam: Appears calm and comfortable, NAD  Respiratory system: CTA, respiratory effort normal.  No rales rhonchi's wheezing Cardiovascular system: S1 & S2 heard, RRR. No JVD, murmurs, rubs, gallops or clicks.  Gastrointestinal system: Abdomen is  obese, nondistended, soft and nontender,Normal bowel sounds heard.  No guarding or rebound Central nervous system: Alert and oriented x3. No focal neurological deficits. Extremities: No edema /cyanosis       Data Reviewed: I have personally reviewed following labs and imaging studies  CBC: Recent Labs  Lab 01/03/19 2132 01/04/19 0640 01/05/19 0726 01/06/19 0558 01/07/19 0443  WBC 15.8* 9.3 9.1 7.8 7.5  NEUTROABS 9.5*  --   --   --   --   HGB 15.2 13.3 12.6* 12.6* 12.1*  HCT 47.5 40.0 38.3* 36.8* 35.0*  MCV 95.8 91.3 92.5 87.8 87.3  PLT 312 185 190 180 [id number]   Basic Metabolic Panel: Recent Labs  Lab 01/03/19 2132 01/04/19 0640 01/05/19 0726 01/06/19 0558 01/07/19 0443  NA 134* 136 134* 132* 133*  K 3.9 4.4 3.5 4.2 4.5  CL 101 101 98 98 101  CO2 20* 25 24 24 23   GLUCOSE 573* 370* 147* 266* 164*  BUN 20 23 34* 48* 45*  CREATININE 1.54* 1.41* 1.75* 1.85* 1.50*  CALCIUM 9.1 8.9 8.4* 8.7* 9.0   GFR: Estimated Creatinine Clearance: 55.7 mL/min (A) (by C-G formula based on SCr of 1.5 mg/dL (H)). Liver Function Tests: Recent Labs  Lab 01/03/19 2132  AST 21  ALT 15  ALKPHOS 94  BILITOT 0.6  PROT 8.0  ALBUMIN 4.3   No results for input(s): LIPASE, AMYLASE in the last 168 hours. No results for input(s): AMMONIA in the last 168  hours. Coagulation Profile: Recent Labs  Lab 01/03/19 2132  INR 1.0   Cardiac Enzymes: No results for input(s): CKTOTAL, CKMB, CKMBINDEX, TROPONINI in the last 168 hours. BNP (last 3 results) No results for input(s): PROBNP in the last 8760 hours. HbA1C: No results for input(s): HGBA1C in the last 72 hours. CBG: Recent Labs  Lab 01/06/19 1134 01/06/19 1622 01/06/19 2036 01/07/19 0747 01/07/19 1137  GLUCAP 236* 239* 252* 164* 273*   Lipid Profile: No results for input(s): CHOL, HDL, LDLCALC, TRIG, CHOLHDL, LDLDIRECT in the last 72 hours. Thyroid Function Tests: No results for input(s): TSH, T4TOTAL, FREET4, T3FREE, THYROIDAB  in the last 72 hours. Anemia Panel: No results for input(s): VITAMINB12, FOLATE, FERRITIN, TIBC, IRON, RETICCTPCT in the last 72 hours. Sepsis Labs: Recent Labs  Lab 01/04/19 0044  PROCALCITON <0.10    Recent Results (from the past 240 hour(s))  Blood culture (routine x 2)     Status: None (Preliminary result)   Collection Time: 01/03/19  9:32 PM   Specimen: BLOOD  Result Value Ref Range Status   Specimen Description BLOOD BLOOD LEFT HAND  Final   Special Requests   Final    BOTTLES DRAWN AEROBIC AND ANAEROBIC Blood Culture adequate volume   Culture   Final    NO GROWTH 4 DAYS Performed at [facility], [address] [place], [place] [address]    Report Status PENDING  Incomplete  Blood culture (routine x 2)     Status: None (Preliminary result)   Collection Time: 01/03/19  9:32 PM   Specimen: BLOOD  Result Value Ref Range Status   Specimen Description BLOOD BLOOD LEFT HAND  Final   Special Requests   Final    BOTTLES DRAWN AEROBIC AND ANAEROBIC Blood Culture results may not be optimal due to an excessive volume of blood received in culture bottles   Culture   Final    NO GROWTH 4 DAYS Performed at [facility], [address], [place], [place] [address]    Report Status PENDING  Incomplete  Respiratory Panel by RT PCR (Flu A&B, Covid) - Nasopharyngeal Swab     Status: None   Collection Time: 01/03/19 10:49 PM   Specimen: Nasopharyngeal Swab  Result Value Ref Range Status   SARS Coronavirus 2 by RT PCR NEGATIVE NEGATIVE Final    Comment: (NOTE) SARS-CoV-2 target nucleic acids are NOT DETECTED. The SARS-CoV-2 RNA is generally detectable in upper respiratoy specimens during the acute phase of infection. The lowest concentration of SARS-CoV-2 viral copies this assay can detect is 131 copies/mL. A negative result does not preclude SARS-Cov-2 infection and should not be used as the sole basis for treatment or other patient management decisions. A  negative result may occur with  improper specimen collection/handling, submission of specimen other than nasopharyngeal swab, presence of viral mutation(s) within the areas targeted by this assay, and inadequate number of viral copies (<131 copies/mL). A negative result must be combined with clinical observations, patient history, and epidemiological information. The expected result is Negative. Fact Sheet for Patients:  PinkCheek.be Fact Sheet for Healthcare Providers:  GravelBags.it This test is not yet ap proved or cleared by the [place] FDA and  has been authorized for detection and/or diagnosis of SARS-CoV-2 by FDA under an Emergency Use Authorization (EUA). This EUA will remain  in effect (meaning this test can be used) for the duration of the COVID-19 declaration under Section 564(b)(1) of the Act, 21 U.S.C. section 360bbb-3(b)(1), unless the  authorization is terminated or revoked sooner.    Influenza A by PCR NEGATIVE NEGATIVE Final   Influenza B by PCR NEGATIVE NEGATIVE Final    Comment: (NOTE) The Xpert Xpress SARS-CoV-2/FLU/RSV assay is intended as an aid in  the diagnosis of influenza from Nasopharyngeal swab specimens and  should not be used as a sole basis for treatment. Nasal washings and  aspirates are unacceptable for Xpert Xpress SARS-CoV-2/FLU/RSV  testing. Fact Sheet for Patients: PinkCheek.be Fact Sheet for Healthcare Providers: GravelBags.it This test is not yet approved or cleared by the [place] FDA and  has been authorized for detection and/or diagnosis of SARS-CoV-2 by  FDA under an Emergency Use Authorization (EUA). This EUA will remain  in effect (meaning this test can be used) for the duration of the  Covid-19 declaration under Section 564(b)(1) of the Act, 21  U.S.C. section 360bbb-3(b)(1), unless the authorization is   terminated or revoked. Performed at [facility], [address] [place], [place] [address]   MRSA PCR Screening     Status: None   Collection Time: 01/04/19  7:02 PM   Specimen: Nasal Mucosa; Nasopharyngeal  Result Value Ref Range Status   MRSA by PCR NEGATIVE NEGATIVE Final    Comment:        The GeneXpert MRSA Assay (FDA approved for NASAL specimens only), is one component of a comprehensive MRSA colonization surveillance program. It is not intended to diagnose MRSA infection nor to guide or monitor treatment for MRSA infections. Performed at [facility], [address], [place], [place] [address]          Radiology Studies: No results found.      Scheduled Meds: . amiodarone  400 mg Oral BID  . aspirin EC  81 mg Oral Daily  . carvedilol  6.25 mg Oral BID WC  . Chlorhexidine Gluconate Cloth  6 each Topical Daily  . cholecalciferol  2,000 Units Oral BID  . ezetimibe  10 mg Oral QHS  . insulin aspart  0-15 Units Subcutaneous TID WC  . insulin aspart  0-5 Units Subcutaneous QHS  . insulin aspart protamine- aspart  25 Units Subcutaneous BID WC  . isosorbide mononitrate  30 mg Oral Daily  . nitroGLYCERIN  1 inch Topical Q6H  . potassium chloride SA  20 mEq Oral BID  . rosuvastatin  40 mg Oral QHS  . sertraline  50 mg Oral QHS  . sodium chloride flush  3 mL Intravenous Q12H  . ticagrelor  90 mg Oral BID   Continuous Infusions: . sodium chloride    . heparin 1,500 Units/hr (01/07/19 1126)    Assessment & Plan:   Principal Problem:   NSTEMI (non-ST elevated myocardial infarction) ([place]) Active Problems:   Insulin dependent type 2 diabetes mellitus (HCC)   HTN (hypertension), benign   CKD (chronic kidney disease) stage 3, GFR 30-59 ml/min   Acute on chronic combined systolic and diastolic CHF (congestive heart failure) (HCC)   Hx of CABG   Acute respiratory failure ([place])   1.  Acute on chronic combined systolic diastolic  CHF Patient does report having eaten salty foods dietary noncompliance likely is attributing. Covid negative. CT negative for PE Improved and euvolemic  Creatinine increased-IV Lasix discontinued Discontinued Entresto too for now due to decreased renal function Echo EF 25 to 30%   2.  NSTEMI- Troponin elevated, possibly due to demand ischemia Continue with aspirin statin beta-blockers nitro Brilinta Cardiology following On heparin drip.continue carvedilol and  aspirin  plan to hold cath until creatinine improves, possibly Monday   3.  Hypertension- Actually with low BP currently after diuresis Hold Entresto due to creatinine increasing Continue carvedilol  4.  Dyslipidemia Continue statin  5.WC/VT- on initial EKG found with wide-complex tachycardia On amiodarone, increased to 400 mg twice daily for reload per cards VT presumably ischemic in etiology Status post ICD placement [employer] was called by cardiology to interrogate due to #1   6. DM2- Uncontrolled Last A1c 11.2 Carb controlled diet Insulin RISS  7.A/CKD III-  Renal function was  worsening , now improved , close to baseline Diuretics on hold  Held entresto   DVT prophylaxis: Heparin Code Status: Full Family Communication: None at bedside Disposition Plan: Likely be here 2 more midnight days, cath on Monday        LOS: 3 days   Time spent: 45 minutes with more than 50 percent on [place], MD [facility] Pager [phone]  If 7PM-7AM, please contact night-coverage [email or web address] Password [facility] 01/07/2019, 2:08 PM Patient ID: Martin Bush, male   DOB: 10-09-1955, 63 y.o.   MRN: [id number]

## 2019-01-07 NOTE — Plan of Care (Signed)
  Problem: Education: Goal: Knowledge of General Education information will improve Description Including pain rating scale, medication(s)/side effects and non-pharmacologic comfort measures Outcome: Progressing   Problem: Activity: Goal: Risk for activity intolerance will decrease Outcome: Progressing   Problem: Safety: Goal: Ability to remain free from injury will improve Outcome: Progressing   

## 2019-01-08 ENCOUNTER — Inpatient Hospital Stay (HOSPITAL_COMMUNITY)
Admission: AD | Admit: 2019-01-08 | Discharge: 2019-01-11 | DRG: 250 | Disposition: A | Discharge status: Home / Self Care | Payer: No Typology Code available for payment source | Source: Other Acute Inpatient Hospital | Attending: Cardiology | Admitting: Cardiology

## 2019-01-08 ENCOUNTER — Encounter: Payer: Self-pay | Admitting: Internal Medicine

## 2019-01-08 ENCOUNTER — Encounter
Admission: EM | Disposition: A | Discharge status: Other Acute Inpatient Hospital | Payer: Self-pay | Source: Home / Self Care | Attending: Internal Medicine

## 2019-01-08 DIAGNOSIS — K439 Ventral hernia without obstruction or gangrene: Secondary | ICD-10-CM | POA: Diagnosis not present

## 2019-01-08 DIAGNOSIS — Z20828 Contact with and (suspected) exposure to other viral communicable diseases: Secondary | ICD-10-CM | POA: Diagnosis present

## 2019-01-08 DIAGNOSIS — I25110 Atherosclerotic heart disease of native coronary artery with unstable angina pectoris: Secondary | ICD-10-CM | POA: Diagnosis present

## 2019-01-08 DIAGNOSIS — I5043 Acute on chronic combined systolic (congestive) and diastolic (congestive) heart failure: Secondary | ICD-10-CM | POA: Diagnosis not present

## 2019-01-08 DIAGNOSIS — I252 Old myocardial infarction: Secondary | ICD-10-CM | POA: Diagnosis not present

## 2019-01-08 DIAGNOSIS — T82855A Stenosis of coronary artery stent, initial encounter: Secondary | ICD-10-CM | POA: Diagnosis not present

## 2019-01-08 DIAGNOSIS — I2510 Atherosclerotic heart disease of native coronary artery without angina pectoris: Secondary | ICD-10-CM | POA: Diagnosis not present

## 2019-01-08 DIAGNOSIS — Z8049 Family history of malignant neoplasm of other genital organs: Secondary | ICD-10-CM

## 2019-01-08 DIAGNOSIS — Z9981 Dependence on supplemental oxygen: Secondary | ICD-10-CM

## 2019-01-08 DIAGNOSIS — E669 Obesity, unspecified: Secondary | ICD-10-CM | POA: Diagnosis present

## 2019-01-08 DIAGNOSIS — I255 Ischemic cardiomyopathy: Secondary | ICD-10-CM | POA: Diagnosis present

## 2019-01-08 DIAGNOSIS — E1159 Type 2 diabetes mellitus with other circulatory complications: Secondary | ICD-10-CM | POA: Diagnosis not present

## 2019-01-08 DIAGNOSIS — Z7902 Long term (current) use of antithrombotics/antiplatelets: Secondary | ICD-10-CM

## 2019-01-08 DIAGNOSIS — Z9861 Coronary angioplasty status: Secondary | ICD-10-CM

## 2019-01-08 DIAGNOSIS — Z801 Family history of malignant neoplasm of trachea, bronchus and lung: Secondary | ICD-10-CM

## 2019-01-08 DIAGNOSIS — E782 Mixed hyperlipidemia: Secondary | ICD-10-CM | POA: Diagnosis present

## 2019-01-08 DIAGNOSIS — Z9842 Cataract extraction status, left eye: Secondary | ICD-10-CM

## 2019-01-08 DIAGNOSIS — I25810 Atherosclerosis of coronary artery bypass graft(s) without angina pectoris: Secondary | ICD-10-CM

## 2019-01-08 DIAGNOSIS — Z95810 Presence of automatic (implantable) cardiac defibrillator: Secondary | ICD-10-CM | POA: Diagnosis not present

## 2019-01-08 DIAGNOSIS — N1832 Chronic kidney disease, stage 3b: Secondary | ICD-10-CM | POA: Diagnosis not present

## 2019-01-08 DIAGNOSIS — I130 Hypertensive heart and chronic kidney disease with heart failure and stage 1 through stage 4 chronic kidney disease, or unspecified chronic kidney disease: Secondary | ICD-10-CM | POA: Diagnosis not present

## 2019-01-08 DIAGNOSIS — Z955 Presence of coronary angioplasty implant and graft: Secondary | ICD-10-CM

## 2019-01-08 DIAGNOSIS — Z6833 Body mass index (BMI) 33.0-33.9, adult: Secondary | ICD-10-CM

## 2019-01-08 DIAGNOSIS — Z794 Long term (current) use of insulin: Secondary | ICD-10-CM

## 2019-01-08 DIAGNOSIS — Z9989 Dependence on other enabling machines and devices: Secondary | ICD-10-CM

## 2019-01-08 DIAGNOSIS — N183 Chronic kidney disease, stage 3 unspecified: Secondary | ICD-10-CM | POA: Diagnosis present

## 2019-01-08 DIAGNOSIS — I447 Left bundle-branch block, unspecified: Secondary | ICD-10-CM | POA: Diagnosis not present

## 2019-01-08 DIAGNOSIS — Y831 Surgical operation with implant of artificial internal device as the cause of abnormal reaction of the patient, or of later complication, without mention of misadventure at the time of the procedure: Secondary | ICD-10-CM | POA: Diagnosis present

## 2019-01-08 DIAGNOSIS — I472 Ventricular tachycardia: Secondary | ICD-10-CM | POA: Diagnosis present

## 2019-01-08 DIAGNOSIS — Z86010 Personal history of colonic polyps: Secondary | ICD-10-CM

## 2019-01-08 DIAGNOSIS — I214 Non-ST elevation (NSTEMI) myocardial infarction: Secondary | ICD-10-CM | POA: Diagnosis not present

## 2019-01-08 DIAGNOSIS — Z888 Allergy status to other drugs, medicaments and biological substances status: Secondary | ICD-10-CM

## 2019-01-08 DIAGNOSIS — F329 Major depressive disorder, single episode, unspecified: Secondary | ICD-10-CM | POA: Diagnosis present

## 2019-01-08 DIAGNOSIS — Z87891 Personal history of nicotine dependence: Secondary | ICD-10-CM

## 2019-01-08 DIAGNOSIS — Z951 Presence of aortocoronary bypass graft: Secondary | ICD-10-CM

## 2019-01-08 DIAGNOSIS — Z79899 Other long term (current) drug therapy: Secondary | ICD-10-CM

## 2019-01-08 DIAGNOSIS — I25700 Atherosclerosis of coronary artery bypass graft(s), unspecified, with unstable angina pectoris: Secondary | ICD-10-CM | POA: Diagnosis not present

## 2019-01-08 DIAGNOSIS — Z8349 Family history of other endocrine, nutritional and metabolic diseases: Secondary | ICD-10-CM

## 2019-01-08 DIAGNOSIS — I97190 Other postprocedural cardiac functional disturbances following cardiac surgery: Secondary | ICD-10-CM | POA: Diagnosis not present

## 2019-01-08 DIAGNOSIS — E1165 Type 2 diabetes mellitus with hyperglycemia: Secondary | ICD-10-CM | POA: Diagnosis present

## 2019-01-08 DIAGNOSIS — E785 Hyperlipidemia, unspecified: Secondary | ICD-10-CM | POA: Diagnosis present

## 2019-01-08 DIAGNOSIS — Y712 Prosthetic and other implants, materials and accessory cardiovascular devices associated with adverse incidents: Secondary | ICD-10-CM | POA: Diagnosis present

## 2019-01-08 DIAGNOSIS — Z7982 Long term (current) use of aspirin: Secondary | ICD-10-CM

## 2019-01-08 DIAGNOSIS — Z961 Presence of intraocular lens: Secondary | ICD-10-CM | POA: Diagnosis present

## 2019-01-08 DIAGNOSIS — I5042 Chronic combined systolic (congestive) and diastolic (congestive) heart failure: Secondary | ICD-10-CM | POA: Diagnosis not present

## 2019-01-08 DIAGNOSIS — G4733 Obstructive sleep apnea (adult) (pediatric): Secondary | ICD-10-CM | POA: Diagnosis not present

## 2019-01-08 DIAGNOSIS — Z8249 Family history of ischemic heart disease and other diseases of the circulatory system: Secondary | ICD-10-CM

## 2019-01-08 DIAGNOSIS — I10 Essential (primary) hypertension: Secondary | ICD-10-CM | POA: Diagnosis present

## 2019-01-08 DIAGNOSIS — M180 Bilateral primary osteoarthritis of first carpometacarpal joints: Secondary | ICD-10-CM | POA: Diagnosis present

## 2019-01-08 DIAGNOSIS — E1122 Type 2 diabetes mellitus with diabetic chronic kidney disease: Secondary | ICD-10-CM | POA: Diagnosis present

## 2019-01-08 DIAGNOSIS — N179 Acute kidney failure, unspecified: Secondary | ICD-10-CM | POA: Diagnosis not present

## 2019-01-08 DIAGNOSIS — J810 Acute pulmonary edema: Secondary | ICD-10-CM | POA: Diagnosis not present

## 2019-01-08 DIAGNOSIS — Z833 Family history of diabetes mellitus: Secondary | ICD-10-CM

## 2019-01-08 HISTORY — PX: LEFT HEART CATH AND CORS/GRAFTS ANGIOGRAPHY: CATH118250

## 2019-01-08 LAB — CBC
HCT: 37.4 % — ABNORMAL LOW (ref 39.0–52.0)
HCT: 37.9 % — ABNORMAL LOW (ref 39.0–52.0)
Hemoglobin: 12.6 g/dL — ABNORMAL LOW (ref 13.0–17.0)
Hemoglobin: 12.6 g/dL — ABNORMAL LOW (ref 13.0–17.0)
MCH: 29.9 pg (ref 26.0–34.0)
MCH: 31 pg (ref 26.0–34.0)
MCHC: 33.7 g/dL (ref 30.0–36.0)
MCHC: 33.2 g/dL (ref 30.0–36.0)
MCV: 88.6 fL (ref 80.0–100.0)
MCV: 93.3 fL (ref 80.0–100.0)
Platelets: 200 10*3/uL (ref 150–400)
Platelets: 198 10*3/uL (ref 150–400)
RBC: 4.22 MIL/uL (ref 4.22–5.81)
RBC: 4.06 MIL/uL — ABNORMAL LOW (ref 4.22–5.81)
RDW: 12.7 % (ref 11.5–15.5)
RDW: 12.8 % (ref 11.5–15.5)
WBC: 8 10*3/uL (ref 4.0–10.5)
WBC: 7.5 10*3/uL (ref 4.0–10.5)
nRBC: 0 % (ref 0.0–0.2)
nRBC: 0 % (ref 0.0–0.2)

## 2019-01-08 LAB — GLUCOSE, CAPILLARY
Glucose-Capillary: 141 mg/dL — ABNORMAL HIGH (ref 70–99)
Glucose-Capillary: 160 mg/dL — ABNORMAL HIGH (ref 70–99)
Glucose-Capillary: 198 mg/dL — ABNORMAL HIGH (ref 70–99)
Glucose-Capillary: 214 mg/dL — ABNORMAL HIGH (ref 70–99)

## 2019-01-08 LAB — CULTURE, BLOOD (ROUTINE X 2)
Culture: NO GROWTH
Culture: NO GROWTH
Special Requests: ADEQUATE

## 2019-01-08 LAB — HEPARIN LEVEL (UNFRACTIONATED): Heparin Unfractionated: 0.53 IU/mL (ref 0.30–0.70)

## 2019-01-08 SURGERY — LEFT HEART CATH AND CORS/GRAFTS ANGIOGRAPHY
Anesthesia: Moderate Sedation

## 2019-01-08 MED ORDER — POTASSIUM CHLORIDE CRYS ER 20 MEQ PO TBCR
20.0000 meq | EXTENDED_RELEASE_TABLET | Freq: Two times a day (BID) | ORAL | Status: DC
  Administered 2019-01-08 – 2019-01-10 (×5): 20 meq via ORAL
  Filled 2019-01-08 (×2): qty 2
  Filled 2019-01-08: qty 1
  Filled 2019-01-08: qty 2
  Filled 2019-01-08: qty 1

## 2019-01-08 MED ORDER — EZETIMIBE 10 MG PO TABS
10.0000 mg | ORAL_TABLET | Freq: Every day | ORAL | Status: DC
  Administered 2019-01-09 – 2019-01-10 (×2): 10 mg via ORAL
  Filled 2019-01-08 (×2): qty 1

## 2019-01-08 MED ORDER — HEPARIN SODIUM (PORCINE) 5000 UNIT/ML IJ SOLN: 5000.0000 [IU] | Freq: Three times a day (TID) | INTRAMUSCULAR | Status: DC

## 2019-01-08 MED ORDER — TICAGRELOR 90 MG PO TABS: 90.0000 mg | ORAL_TABLET | Freq: Two times a day (BID) | ORAL | Status: DC

## 2019-01-08 MED ORDER — CARVEDILOL 6.25 MG PO TABS
6.2500 mg | ORAL_TABLET | Freq: Two times a day (BID) | ORAL | Status: DC
  Administered 2019-01-08 (×2): 6.25 mg via ORAL
  Filled 2019-01-08 (×2): qty 1

## 2019-01-08 MED ORDER — NITROGLYCERIN 0.4 MG SL SUBL: 0.4000 mg | SUBLINGUAL_TABLET | SUBLINGUAL | 12 refills | Status: DC | PRN

## 2019-01-08 MED ORDER — SODIUM CHLORIDE 0.9% FLUSH
3.0000 mL | Freq: Two times a day (BID) | INTRAVENOUS | Status: DC
  Administered 2019-01-09 – 2019-01-10 (×5): 3 mL via INTRAVENOUS

## 2019-01-08 MED ORDER — ISOSORBIDE MONONITRATE ER 30 MG PO TB24
30.0000 mg | ORAL_TABLET | Freq: Every day | ORAL | Status: DC
  Administered 2019-01-08: 30 mg via ORAL
  Filled 2019-01-08: qty 1

## 2019-01-08 MED ORDER — ROSUVASTATIN CALCIUM 20 MG PO TABS
40.0000 mg | ORAL_TABLET | Freq: Every day | ORAL | Status: DC
  Administered 2019-01-09 – 2019-01-10 (×2): 40 mg via ORAL
  Filled 2019-01-08 (×2): qty 2

## 2019-01-08 MED ORDER — NITROGLYCERIN 2 % TD OINT: 1.0000 [in_us] | TOPICAL_OINTMENT | Freq: Four times a day (QID) | TRANSDERMAL | Status: DC

## 2019-01-08 MED ORDER — ASPIRIN EC 81 MG PO TBEC: 81.0000 mg | DELAYED_RELEASE_TABLET | Freq: Every day | ORAL | Status: DC

## 2019-01-08 MED ORDER — ASPIRIN 81 MG PO TBEC: 81.0000 mg | DELAYED_RELEASE_TABLET | Freq: Every day | ORAL | Status: AC

## 2019-01-08 MED ORDER — OMEGA-3-ACID ETHYL ESTERS 1 G PO CAPS
2.0000 g | ORAL_CAPSULE | Freq: Two times a day (BID) | ORAL | Status: DC
  Administered 2019-01-08 – 2019-01-10 (×5): 2 g via ORAL
  Filled 2019-01-08 (×5): qty 2

## 2019-01-08 MED ORDER — CARVEDILOL 6.25 MG PO TABS: 6.2500 mg | ORAL_TABLET | Freq: Two times a day (BID) | ORAL | Status: DC

## 2019-01-08 MED ORDER — NITROGLYCERIN 2 % TD OINT
1.0000 [in_us] | TOPICAL_OINTMENT | Freq: Four times a day (QID) | TRANSDERMAL | Status: DC
  Administered 2019-01-09 – 2019-01-11 (×10): 1 [in_us] via TOPICAL
  Filled 2019-01-08: qty 30

## 2019-01-08 MED ORDER — VERAPAMIL HCL 2.5 MG/ML IV SOLN
INTRAVENOUS | Status: DC | PRN
  Administered 2019-01-08: 2.5 mg via INTRA_ARTERIAL

## 2019-01-08 MED ORDER — INSULIN ASPART PROT & ASPART (70-30 MIX) 100 UNIT/ML ~~LOC~~ SUSP: 25.0000 [IU] | Freq: Two times a day (BID) | SUBCUTANEOUS | 11 refills | Status: DC

## 2019-01-08 MED ORDER — IOHEXOL 300 MG/ML  SOLN
INTRAMUSCULAR | Status: DC | PRN
  Administered 2019-01-08: 55 mL

## 2019-01-08 MED ORDER — FENTANYL CITRATE (PF) 100 MCG/2ML IJ SOLN
INTRAMUSCULAR | Status: AC
  Filled 2019-01-08: qty 2

## 2019-01-08 MED ORDER — AMIODARONE HCL 400 MG PO TABS: 400.0000 mg | ORAL_TABLET | Freq: Two times a day (BID) | ORAL | Status: DC

## 2019-01-08 MED ORDER — ASPIRIN 81 MG PO CHEW
81.0000 mg | CHEWABLE_TABLET | ORAL | Status: AC
  Administered 2019-01-08: 81 mg via ORAL
  Filled 2019-01-08: qty 1

## 2019-01-08 MED ORDER — INSULIN ASPART 100 UNIT/ML ~~LOC~~ SOLN: 0.0000 [IU] | Freq: Every day | SUBCUTANEOUS | Status: DC

## 2019-01-08 MED ORDER — ROSUVASTATIN CALCIUM 40 MG PO TABS: 40.0000 mg | ORAL_TABLET | Freq: Every day | ORAL | Status: AC

## 2019-01-08 MED ORDER — HEPARIN (PORCINE) 25000 UT/250ML-% IV SOLN
1500.0000 [IU]/h | INTRAVENOUS | Status: DC
  Administered 2019-01-08: 1500 [IU]/h via INTRAVENOUS
  Filled 2019-01-08: qty 250

## 2019-01-08 MED ORDER — INSULIN ASPART 100 UNIT/ML ~~LOC~~ SOLN: 0.0000 [IU] | Freq: Three times a day (TID) | SUBCUTANEOUS | Status: DC

## 2019-01-08 MED ORDER — POTASSIUM CHLORIDE CRYS ER 20 MEQ PO TBCR: 20.0000 meq | EXTENDED_RELEASE_TABLET | Freq: Two times a day (BID) | ORAL | Status: DC

## 2019-01-08 MED ORDER — VITAMIN D 25 MCG (1000 UNIT) PO TABS: 2000.0000 [IU] | ORAL_TABLET | Freq: Two times a day (BID) | ORAL | Status: DC

## 2019-01-08 MED ORDER — ASPIRIN EC 81 MG PO TBEC
81.0000 mg | DELAYED_RELEASE_TABLET | Freq: Every day | ORAL | Status: DC
  Administered 2019-01-09 – 2019-01-11 (×3): 81 mg via ORAL
  Filled 2019-01-08 (×3): qty 1

## 2019-01-08 MED ORDER — ACETAMINOPHEN 325 MG PO TABS: 650.0000 mg | ORAL_TABLET | ORAL | Status: DC | PRN

## 2019-01-08 MED ORDER — ONDANSETRON HCL 4 MG/2ML IJ SOLN: 4.0000 mg | Freq: Four times a day (QID) | INTRAMUSCULAR | Status: DC | PRN

## 2019-01-08 MED ORDER — SODIUM CHLORIDE 0.9 % IV SOLN: 250.0000 mL | INTRAVENOUS | Status: DC | PRN

## 2019-01-08 MED ORDER — VITAMIN D 25 MCG (1000 UNIT) PO TABS
2000.0000 [IU] | ORAL_TABLET | Freq: Two times a day (BID) | ORAL | Status: DC
  Administered 2019-01-08 – 2019-01-10 (×5): 2000 [IU] via ORAL
  Filled 2019-01-08 (×5): qty 2

## 2019-01-08 MED ORDER — INSULIN ASPART PROT & ASPART (70-30 MIX) 100 UNIT/ML ~~LOC~~ SUSP
25.0000 [IU] | Freq: Two times a day (BID) | SUBCUTANEOUS | Status: DC
  Administered 2019-01-09 – 2019-01-10 (×4): 25 [IU] via SUBCUTANEOUS
  Filled 2019-01-08: qty 10

## 2019-01-08 MED ORDER — ISOSORBIDE MONONITRATE ER 30 MG PO TB24: 30.0000 mg | ORAL_TABLET | Freq: Every day | ORAL | Status: DC

## 2019-01-08 MED ORDER — VERAPAMIL HCL 2.5 MG/ML IV SOLN
INTRAVENOUS | Status: AC
  Filled 2019-01-08: qty 2

## 2019-01-08 MED ORDER — MIDAZOLAM HCL 2 MG/2ML IJ SOLN
INTRAMUSCULAR | Status: AC
  Filled 2019-01-08: qty 2

## 2019-01-08 MED ORDER — MIDAZOLAM HCL 2 MG/2ML IJ SOLN
INTRAMUSCULAR | Status: DC | PRN
  Administered 2019-01-08: 1 mg via INTRAVENOUS

## 2019-01-08 MED ORDER — EZETIMIBE 10 MG PO TABS: 10.0000 mg | ORAL_TABLET | Freq: Every day | ORAL | Status: DC

## 2019-01-08 MED ORDER — AMIODARONE HCL 200 MG PO TABS: 400.0000 mg | ORAL_TABLET | Freq: Two times a day (BID) | ORAL | Status: DC

## 2019-01-08 MED ORDER — SODIUM CHLORIDE 0.9 % IV SOLN
INTRAVENOUS | Status: DC
  Administered 2019-01-08: 05:00:00 via INTRAVENOUS

## 2019-01-08 MED ORDER — HEPARIN SODIUM (PORCINE) 1000 UNIT/ML IJ SOLN
INTRAMUSCULAR | Status: AC
  Filled 2019-01-08: qty 1

## 2019-01-08 MED ORDER — SODIUM CHLORIDE 0.9% FLUSH: 3.0000 mL | Freq: Two times a day (BID) | INTRAVENOUS | Status: DC

## 2019-01-08 MED ORDER — SODIUM CHLORIDE 0.9 % IV SOLN: INTRAVENOUS | Status: AC

## 2019-01-08 MED ORDER — ISOSORBIDE MONONITRATE ER 30 MG PO TB24
30.0000 mg | ORAL_TABLET | Freq: Every day | ORAL | Status: DC
  Administered 2019-01-09 – 2019-01-10 (×2): 30 mg via ORAL
  Filled 2019-01-08 (×2): qty 1

## 2019-01-08 MED ORDER — SERTRALINE HCL 50 MG PO TABS
50.0000 mg | ORAL_TABLET | Freq: Every day | ORAL | Status: DC
  Administered 2019-01-08 – 2019-01-10 (×3): 50 mg via ORAL
  Filled 2019-01-08 (×3): qty 1

## 2019-01-08 MED ORDER — AMIODARONE HCL 200 MG PO TABS
400.0000 mg | ORAL_TABLET | Freq: Two times a day (BID) | ORAL | Status: DC
  Administered 2019-01-08 – 2019-01-10 (×5): 400 mg via ORAL
  Filled 2019-01-08 (×5): qty 2

## 2019-01-08 MED ORDER — ROSUVASTATIN CALCIUM 20 MG PO TABS: 40.0000 mg | ORAL_TABLET | Freq: Every day | ORAL | Status: DC

## 2019-01-08 MED ORDER — HEPARIN SODIUM (PORCINE) 1000 UNIT/ML IJ SOLN
INTRAMUSCULAR | Status: DC | PRN
  Administered 2019-01-08: 5000 [IU] via INTRAVENOUS

## 2019-01-08 MED ORDER — NITROGLYCERIN 2 % TD OINT: 1.0000 [in_us] | TOPICAL_OINTMENT | Freq: Four times a day (QID) | TRANSDERMAL | 0 refills | Status: DC

## 2019-01-08 MED ORDER — NITROGLYCERIN 0.4 MG SL SUBL: 0.4000 mg | SUBLINGUAL_TABLET | SUBLINGUAL | Status: DC | PRN

## 2019-01-08 MED ORDER — SODIUM CHLORIDE FLUSH 0.9 % IV SOLN
INTRAVENOUS | Status: AC
  Filled 2019-01-08: qty 10

## 2019-01-08 MED ORDER — HEPARIN SODIUM (PORCINE) 5000 UNIT/ML IJ SOLN
5000.0000 [IU] | Freq: Three times a day (TID) | INTRAMUSCULAR | Status: DC
  Administered 2019-01-09 – 2019-01-11 (×8): 5000 [IU] via SUBCUTANEOUS
  Filled 2019-01-08 (×8): qty 1

## 2019-01-08 MED ORDER — POTASSIUM CHLORIDE CRYS ER 20 MEQ PO TBCR
20.0000 meq | EXTENDED_RELEASE_TABLET | Freq: Two times a day (BID) | ORAL | Status: DC
  Administered 2019-01-08: 20 meq via ORAL
  Filled 2019-01-08: qty 1

## 2019-01-08 MED ORDER — SODIUM CHLORIDE 0.9% FLUSH: 3.0000 mL | INTRAVENOUS | Status: DC | PRN

## 2019-01-08 MED ORDER — VITAMIN D 25 MCG (1000 UNIT) PO TABS
2000.0000 [IU] | ORAL_TABLET | Freq: Two times a day (BID) | ORAL | Status: DC
  Administered 2019-01-08: 2000 [IU] via ORAL
  Filled 2019-01-08: qty 2

## 2019-01-08 MED ORDER — TICAGRELOR 90 MG PO TABS
90.0000 mg | ORAL_TABLET | Freq: Two times a day (BID) | ORAL | Status: DC
  Administered 2019-01-08 – 2019-01-11 (×6): 90 mg via ORAL
  Filled 2019-01-08 (×6): qty 1

## 2019-01-08 MED ORDER — NITROGLYCERIN 0.3 MG SL SUBL: 0.3000 mg | SUBLINGUAL_TABLET | SUBLINGUAL | Status: DC | PRN

## 2019-01-08 MED ORDER — TICAGRELOR 90 MG PO TABS
90.0000 mg | ORAL_TABLET | Freq: Two times a day (BID) | ORAL | Status: DC
  Administered 2019-01-08: 90 mg via ORAL
  Filled 2019-01-08: qty 1

## 2019-01-08 MED ORDER — HEPARIN (PORCINE) IN NACL 1000-0.9 UT/500ML-% IV SOLN
INTRAVENOUS | Status: DC | PRN
  Administered 2019-01-08: 500 mL

## 2019-01-08 MED ORDER — LIRAGLUTIDE 18 MG/3ML ~~LOC~~ SOPN: 1.8000 mg | PEN_INJECTOR | Freq: Every day | SUBCUTANEOUS | Status: DC

## 2019-01-08 MED ORDER — ONDANSETRON HCL 4 MG/2ML IJ SOLN: 4.0000 mg | Freq: Four times a day (QID) | INTRAMUSCULAR | 0 refills | Status: DC | PRN

## 2019-01-08 MED ORDER — INSULIN ASPART 100 UNIT/ML ~~LOC~~ SOLN: 0.0000 [IU] | Freq: Three times a day (TID) | SUBCUTANEOUS | 11 refills | Status: DC

## 2019-01-08 MED ORDER — SERTRALINE HCL 50 MG PO TABS: 50.0000 mg | ORAL_TABLET | Freq: Every day | ORAL | Status: DC

## 2019-01-08 MED ORDER — ASPIRIN 81 MG PO TBEC: 81.0000 mg | DELAYED_RELEASE_TABLET | Freq: Every day | ORAL | Status: DC

## 2019-01-08 MED ORDER — VITAMIN D3 25 MCG PO TABS: 2000.0000 [IU] | ORAL_TABLET | Freq: Two times a day (BID) | ORAL | Status: DC

## 2019-01-08 MED ORDER — FENTANYL CITRATE (PF) 100 MCG/2ML IJ SOLN
INTRAMUSCULAR | Status: DC | PRN
  Administered 2019-01-08: 50 ug via INTRAVENOUS

## 2019-01-08 MED ORDER — CARVEDILOL 6.25 MG PO TABS
6.2500 mg | ORAL_TABLET | Freq: Two times a day (BID) | ORAL | Status: DC
  Administered 2019-01-09 – 2019-01-11 (×5): 6.25 mg via ORAL
  Filled 2019-01-08 (×5): qty 1

## 2019-01-08 MED ORDER — AMIODARONE HCL 200 MG PO TABS
400.0000 mg | ORAL_TABLET | Freq: Two times a day (BID) | ORAL | Status: DC
  Administered 2019-01-08: 400 mg via ORAL
  Filled 2019-01-08: qty 2

## 2019-01-08 MED ORDER — HEPARIN (PORCINE) IN NACL 1000-0.9 UT/500ML-% IV SOLN
INTRAVENOUS | Status: AC
  Filled 2019-01-08: qty 1000

## 2019-01-08 MED ORDER — INSULIN ASPART 100 UNIT/ML ~~LOC~~ SOLN: 0.0000 [IU] | Freq: Every day | SUBCUTANEOUS | 11 refills | Status: DC

## 2019-01-08 SURGICAL SUPPLY — 8 items
CATH INFINITI 5 FR JL3.5 (CATHETERS) ×2 IMPLANT
CATH INFINITI 5FR JL4 (CATHETERS) ×2 IMPLANT
CATH INFINITI JR4 5F (CATHETERS) ×2 IMPLANT
DEVICE RAD TR BAND REGULAR (VASCULAR PRODUCTS) ×2 IMPLANT
GLIDESHEATH SLEND SS 6F .021 (SHEATH) ×2 IMPLANT
KIT MANI 3VAL PERCEP (MISCELLANEOUS) ×2 IMPLANT
PACK CARDIAC CATH (CUSTOM PROCEDURE TRAY) ×2 IMPLANT
WIRE ROSEN-J .035X260CM (WIRE) ×2 IMPLANT

## 2019-01-08 NOTE — Progress Notes (Signed)
Report called to receiving RN, Verdis Frederickson Smith-Fisher. EMTALA being completed, to be printed and sent with pt.

## 2019-01-08 NOTE — H&P (Signed)
Cardiology Admission History and Physical:   Patient ID: Martin Bush MRN: [id number]; DOB: 10/09/1955   Admission date: (Not on file)  Primary Care Provider: [name], MD Primary Cardiologist: Dr. Einar Bush Primary Electrophysiologist:  None   Chief Complaint: Severe three-vessel CAD with in stent restenosis, NSTEMI  Patient Profile:   Martin Bush is a 63 y.o. male with history of CAD s/p CABG and subsequent LM and LCx/OM interventions, ICM/HFrEF s/p AICD, hypertension, hyperlipidemia, DM2, obesity, and OSA on CPAP with O2, and who is being transferred from [facility] to [facility] today for the CVTS evaluation of repeat CABG versus high risk PCI.   History of Present Illness:   Martin Bush is a 63 year old male with PMH as above.  He is followed by Dr. Einar Bush in [place].  In the summer 2019, he underwent repeat catheterization and PCI in the setting of left main and circumflex disease.  He was initially treated with PTCA but subsequently required DES to in-stent restenosis.  In late July, he underwent a third catheterization due to ongoing symptoms.  After this intervention, he was reportedly doing well with last echo 08/2017 showing EF 20 to 25%.  He was not particularly active but able to carry out routine ADLs and household chores without significant symptoms or limitations.  He was eating out 2 meals a day and not particularly careful with his sodium intake.  He reportedly did not experience much lower extremity edema or increase in abdominal girth.    On 01/03/2019, and around 5 PM, he was lifting a heavy toolbox when he noted pain between his shoulder blades.  He felt as if he pulled a muscle.  He initially put ice on the pain, then he tried heat without any improvement in symptoms.  He subsequently began to experience shortness of breath and attempted to treat this with oxygen; however, despite the oxygen, his dyspnea worsened and he began to experience disorientation.  His wife  called EMS, and upon arrival at [facility] ED, he was found to be hypoxic with saturations in the 70s to 80s.  He did not recall much about his trip to the emergency department, because he was struggling for breath and disoriented.  He was placed on BiPAP with significant improvement.  BP 156/98.  Initial EKG showed wide-complex tachycardia at 109 bpm with left bundle branch block.  Following oxygenation and diuresis, EKG showed NSR, 76 bpm, LAD, IVCD.  CTA was negative for dissection or PE.  CHF noted.  Initial troponin 39  2747.  01/04/2019 echo showed LVEF 25 to 30%, G2 DD, mildly dilated LV with hypokinesis.  On 12/14, he underwent repeat catheterization as below with findings of severe underlying three-vessel CAD with patent LIMA to LAD, chronically occluded SVG to OM, and severe in-stent restenosis in the left main stent and extending into the proximal left circumflex.  In addition, it was noted that there is significant restenosis in the stent placed in the left posterior AV groove artery.  RCA was documented to be small in size and severely diseased.  LVEF was not performed due to chronic kidney disease and LVEDP was 13 mmHg.  It was thought to be a difficult management situation overall and given that the patient had very aggressive restenosis in both stents placed the previous year.  It was noted that repeat balloon angioplasty would technically be possible but long-term results may not last.  As a result, recommendation was for transfer to [facility] for surgical consultation and possible  redo CABG versus high risk PCI.  The case was discussed with Dr. Einar Bush and purulent contacted for transfer to [facility] under Dr. Einar Bush service with consultation to CVTS. It was recommended that he be continued on ticagrelor until revascularization decision made with restart of heparin 8h after sheath pull.    [facility] and Dr. Einar Bush / [facility] notified of transfer to [facility].  Orders have been placed. [place]  IM notified and EMTALA and discharge summary to be completed.  Heart Pathway Score:     Past Medical History:  Diagnosis Date  . AICD (automatic cardioverter/defibrillator) present    Medtronic  . Arthritis    "thumbs"  (08/02/2017)  . CHF (congestive heart failure) ([place])   . Chronic combined systolic and diastolic heart failure ([place])    a. 08/2017 Echo: EF 20-25%, mod glob HK. Sev distal ant sept, inflat HK. Apical AK. Gr2 DD. Mildly reduced RV fxn.  . Colon polyps   . Coronary artery disease    a. s/p CABG x 2 (LIMA->LAD, VG->OM); b. Multiple PCI's to LM/LCX/OM; c. 07/2017 PTCA of LM/LCX w/ early ISR-->repeat PCI/DES to LM (3.5x20 Synergy DES) and LCX (3.0x20 Synergy DES); d. 07/2018 Relook Cath: LM patent stent, LAD 100ost, LCX patent stent, OM1 99/60, LIMA->LAD ok. VG->dLCX 56 (old).  . Depression   . Encounter for assessment of implantable cardioverter-defibrillator (ICD) 09/27/2018  . High cholesterol   . Hypertension   . ICD; Biventricular  Medtronic ICD Amplia MRI QWuad CRT-D  in situ 10/29/14 10/29/2014   Remote ICD check 09.23.20:  One 6 beat NSVT. No therapy.  1 SVT episode @ 130 bpm (38 Sec).  There were 23 Vent sense episodes detected for up to 1.1 min/day (AT). Health trends (patient activity, heart rate variability, average heart rates) are stable.Trans-thoracic impedance trends and the OptiVol Fluid Index do no present significant abnormalities. Battery longevity is 4.3 years. RA pacing is 3  . Ischemic cardiomyopathy 09/27/2018  . MI (myocardial infarction) ([place]) 2003  . NSTEMI (non-ST elevated myocardial infarction) ([place]) 07/16/2014  . OSA on CPAP   . Oxygen deficiency   . Pneumonia 10/2014  . Proteinuria   . Sleep apnea   . Type II diabetes mellitus (HCC)    insulin dependent    Past Surgical History:  Procedure Laterality Date  . BIOPSY  09/19/2018   Procedure: BIOPSY;  Surgeon: Martin Park, MD;  Location: [facility];  Service: Gastroenterology;;  . CARDIAC  CATHETERIZATION N/A 07/16/2014   Procedure: Left Heart Cath and Coronary Angiography;  Surgeon: Adrian Prows, MD;  Location: [place] [facility];  Service: Cardiovascular;  Laterality: N/A;  . CARDIAC CATHETERIZATION  07/16/2014   Procedure: Coronary Balloon Angioplasty;  Surgeon: Adrian Prows, MD;  Location: [place] [facility];  Service: Cardiovascular;;  . CARDIAC CATHETERIZATION  2003  . CARDIAC DEFIBRILLATOR PLACEMENT  2016  . CATARACT EXTRACTION W/ INTRAOCULAR LENS IMPLANT Left 03/2014  . COLONOSCOPY    . COLONOSCOPY WITH PROPOFOL N/A 09/19/2018   Procedure: COLONOSCOPY WITH PROPOFOL;  Surgeon: Martin Park, MD;  [facility]: [facility];  Service: Gastroenterology;  Laterality: N/A;  . CORONARY ANGIOPLASTY WITH STENT PLACEMENT     "I've got a total of 8 stents in there; mostly doine at [facility]" (08/02/2017)  . CORONARY ARTERY BYPASS GRAFT  ~ 2003   "CABG X2"; [facility]  . CORONARY ATHERECTOMY N/A 08/16/2017   Procedure: CORONARY ATHERECTOMY;  Surgeon: Nigel Mormon, MD;  Location: [place] CV LAB;  Service: Cardiovascular;  Laterality: N/A;  . CORONARY BALLOON ANGIOPLASTY N/A 08/04/2017   Procedure: CORONARY BALLOON ANGIOPLASTY;  Surgeon: Adrian Prows, MD;  Location: [place] [facility];  Service: Cardiovascular;  Laterality: N/A;  . CORONARY STENT INTERVENTION N/A 08/16/2017   Procedure: CORONARY STENT INTERVENTION;  Surgeon: Nigel Mormon, MD;  Location: [place] [facility];  Service: Cardiovascular;  Laterality: N/A;  . ELBOW SURGERY Left ?2001   "pinched nerve"  . ENDOSCOPIC MUCOSAL RESECTION  12/18/2018   Procedure: ENDOSCOPIC MUCOSAL RESECTION;  Surgeon: Rush Landmark Telford Nab., MD;  Location: [place];  Service: Gastroenterology;;  . Otho Darner SIGMOIDOSCOPY N/A 12/18/2018   Procedure: Beryle Quant;  Surgeon: Irving Copas., MD;  Location: [place];  Service: Gastroenterology;  Laterality: N/A;  . HEMOSTASIS CLIP PLACEMENT  12/18/2018    Procedure: HEMOSTASIS CLIP PLACEMENT;  Surgeon: Irving Copas., MD;  Location: [place];  Service: Gastroenterology;;  . LEFT HEART CATH AND CORS/GRAFTS ANGIOGRAPHY N/A 08/04/2017   Procedure: LEFT HEART CATH AND CORS/GRAFTS ANGIOGRAPHY;  Surgeon: Adrian Prows, MD;  Location: [place] [facility];  Service: Cardiovascular;  Laterality: N/A;  . LEFT HEART CATH AND CORS/GRAFTS ANGIOGRAPHY N/A 08/20/2017   Procedure: LEFT HEART CATH AND CORS/GRAFTS ANGIOGRAPHY;  Surgeon: Nigel Mormon, MD;  Location: [place] [facility];  Service: Cardiovascular;  Laterality: N/A;  . POLYPECTOMY  09/19/2018   Procedure: POLYPECTOMY;  Surgeon: Martin Park, MD;  [facility];  Service: Gastroenterology;;  . Lia Foyer INJECTION  09/19/2018   Procedure: SUBMUCOSAL INJECTION;  Surgeon: Martin Park, MD;  [facility];  Service: Gastroenterology;;  . Lia Foyer LIFTING INJECTION  12/18/2018   Procedure: SUBMUCOSAL LIFTING INJECTION;  Surgeon: Irving Copas., MD;  Location: [place];  Service: Gastroenterology;;     Medications Prior to Admission: Prior to Admission medications   Medication Sig Start Date End Date Taking? Authorizing Provider  acetaminophen (TYLENOL) 325 MG tablet Take 2 tablets (650 mg total) by mouth every 4 (four) hours as needed for headache or mild pain. 01/08/19   Nolberto Hanlon, MD  amiodarone (PACERONE) 200 MG tablet Take 200 mg by mouth daily.  11/19/14   [provider]  amiodarone (PACERONE) 400 MG tablet Take 1 tablet (400 mg total) by mouth 2 (two) times daily. 01/08/19   Nolberto Hanlon, MD  aspirin EC 81 MG EC tablet Take 1 tablet (81 mg total) by mouth daily. 01/08/19   Nolberto Hanlon, MD  aspirin EC 81 MG tablet Take 81 mg by mouth daily.    [provider]  carvedilol (COREG) 12.5 MG tablet Take 6.25 mg by mouth 2 (two) times daily with a meal.    [provider]  carvedilol (COREG) 6.25 MG tablet Take 1 tablet  (6.25 mg total) by mouth 2 (two) times daily with a meal. 01/08/19   Nolberto Hanlon, MD  Cholecalciferol (VITAMIN D) 2000 units tablet Take 2,000 Units by mouth 2 (two) times daily.    [provider]  cholecalciferol (VITAMIN D) 25 MCG tablet Take 2 tablets (2,000 Units total) by mouth 2 (two) times daily. 01/08/19   Nolberto Hanlon, MD  empagliflozin (JARDIANCE) 25 MG TABS tablet Take 25 mg by mouth daily.    [provider]  ezetimibe (ZETIA) 10 MG tablet Take 1 tablet (10 mg total) by mouth daily. Patient taking differently: Take 10 mg by mouth at bedtime.  10/26/18   Martin Haven, MD  ezetimibe (ZETIA) 10 MG tablet Take 1 tablet (10 mg total) by  mouth at bedtime. 01/08/19   Nolberto Hanlon, MD  insulin aspart (NOVOLOG) 100 UNIT/ML injection Inject 0-15 Units into the skin 3 (three) times daily with meals. 01/08/19   Nolberto Hanlon, MD  insulin aspart (NOVOLOG) 100 UNIT/ML injection Inject 0-5 Units into the skin at bedtime. 01/08/19   Nolberto Hanlon, MD  insulin aspart protamine - aspart (NOVOLOG MIX 70/30 FLEXPEN) (70-30) 100 UNIT/ML FlexPen Inject 63 Units into the skin 2 (two) times daily as needed (as needed for blood sugar over 200). as needed for blood sugar over 200    [provider]  insulin aspart protamine- aspart (NOVOLOG MIX 70/30) (70-30) 100 UNIT/ML injection Inject 0.25 mLs (25 Units total) into the skin 2 (two) times daily with a meal. 01/08/19   Nolberto Hanlon, MD  isosorbide mononitrate (IMDUR) 30 MG 24 hr tablet Take 30 mg by mouth daily.    [provider]  isosorbide mononitrate (IMDUR) 30 MG 24 hr tablet Take 1 tablet (30 mg total) by mouth daily. 01/08/19   Nolberto Hanlon, MD  liraglutide (VICTOZA) 18 MG/3ML SOPN Inject 1.8 mg into the skin daily.    [provider]  metFORMIN (GLUCOPHAGE-XR) 500 MG 24 hr tablet Take 500 mg by mouth 2 (two) times daily.     [provider]  metolazone (ZAROXOLYN) 2.5 MG tablet Take 2.5 mg by mouth  daily as needed (for >2 lb weight gain).    [provider]  nitroGLYCERIN (NITROGLYN) 2 % ointment Apply 1 inch topically every 6 (six) hours. 01/08/19   Nolberto Hanlon, MD  nitroGLYCERIN (NITROSTAT) 0.4 MG SL tablet Place 1 tablet (0.4 mg total) under the tongue every 5 (five) minutes x 3 doses as needed for chest pain. 01/08/19   Nolberto Hanlon, MD  Omega-3 Fatty Acids (FISH OIL) 1000 MG CAPS Take 1,000 mg by mouth 2 (two) times daily.     [provider]  ondansetron (ZOFRAN) 4 MG/2ML SOLN injection Inject 2 mLs (4 mg total) into the vein every 6 (six) hours as needed for nausea. 01/08/19   Nolberto Hanlon, MD  potassium chloride SA (K-DUR,KLOR-CON) 20 MEQ tablet Take 1 tablet (20 mEq total) by mouth 2 (two) times daily. 01/07/18   Erskine Squibb, MD  potassium chloride SA (KLOR-CON) 20 MEQ tablet Take 1 tablet (20 mEq total) by mouth 2 (two) times daily. 01/08/19   Nolberto Hanlon, MD  PRESCRIPTION MEDICATION Inhale into the lungs at bedtime. CPAP    [provider]  rosuvastatin (CRESTOR) 40 MG tablet Take 40 mg by mouth at bedtime.    [provider]  rosuvastatin (CRESTOR) 40 MG tablet Take 1 tablet (40 mg total) by mouth at bedtime. 01/08/19   Nolberto Hanlon, MD  sacubitril-valsartan (ENTRESTO) 97-103 MG Take 1 tablet by mouth 2 (two) times daily.     [provider]  sertraline (ZOLOFT) 100 MG tablet Take 50 mg by mouth at bedtime.     [provider]  sertraline (ZOLOFT) 50 MG tablet Take 1 tablet (50 mg total) by mouth at bedtime. 01/08/19   Nolberto Hanlon, MD  ticagrelor (BRILINTA) 90 MG TABS tablet Take 1 tablet (90 mg total) by mouth 2 (two) times daily. 08/05/17   Adrian Prows, MD  ticagrelor (BRILINTA) 90 MG TABS tablet Take 1 tablet (90 mg total) by mouth 2 (two) times daily. 01/08/19   Nolberto Hanlon, MD  torsemide (DEMADEX) 20 MG tablet Take 1 tablet (20 mg total) by mouth 2 (two) times daily. Patient  taking differently: Take 20 mg by mouth 2  (two) times daily as needed (fluid).  10/19/18   Miquel Dunn, NP     Allergies:    Allergies  Allergen Reactions  . Diltiazem Rash    Social History:   Social History   Socioeconomic History  . Marital status: Married    Spouse name: Not on file  . Number of children: Not on file  . Years of education: Not on file  . Highest education level: Not on file  Occupational History  . Not on file  Tobacco Use  . Smoking status: Former Smoker    Years: 27.00    Types: Cigars    Quit date: 2002    Years since quitting: 18.9  . Smokeless tobacco: Former Systems developer    Quit date: 2002  Substance and Sexual Activity  . Alcohol use: Not Currently  . Drug use: Never  . Sexual activity: Not Currently  Other Topics Concern  . Not on file  Social History Narrative  . Not on file   Social Determinants of Health   Financial Resource Strain:   . Difficulty of Paying Living Expenses: Not on file  Food Insecurity:   . Worried About Charity fundraiser in the Last Year: Not on file  . Ran Out of Food in the Last Year: Not on file  Transportation Needs:   . Lack of Transportation (Medical): Not on file  . Lack of Transportation (Non-Medical): Not on file  Physical Activity:   . Days of Exercise per Week: Not on file  . Minutes of Exercise per Session: Not on file  Stress:   . Feeling of Stress : Not on file  Social Connections:   . Frequency of Communication with Friends and Family: Not on file  . Frequency of Social Gatherings with Friends and Family: Not on file  . Attends Religious Services: Not on file  . Active Member of Clubs or Organizations: Not on file  . Attends Archivist Meetings: Not on file  . Marital Status: Not on file  Intimate Partner Violence:   . Fear of Current or Ex-Partner: Not on file  . Emotionally Abused: Not on file  . Physically Abused: Not on file  . Sexually Abused: Not on file    Family History:   The patient's family history  includes Diabetes in his father; Heart disease in his father; Hyperlipidemia in his father; Hypertension in his father; Lung cancer in his mother; Uterine cancer in his mother. There is no history of Colon cancer, Esophageal cancer, Colon polyps, Rectal cancer, Stomach cancer, Inflammatory bowel disease, Liver disease, or Pancreatic cancer.    ROS:  Please see the history of present illness.  Please refer to MD ROS.    Physical Exam/Data:  There were no vitals filed for this visit. No intake or output data in the 24 hours ending 01/08/19 1143 Last 3 Weights 01/08/2019 01/07/2019 01/06/2019  Weight (lbs) 210 lb 1.6 oz 212 lb 1.6 oz 215 lb 11.2 oz  Weight (kg) 95.301 kg 96.208 kg 97.841 kg     There is no height or weight on file to calculate BMI.   Please refer to MD exam.  EKG:  No new tracings.  Relevant CV Studies:  LHC  01/08/19 Coronary Findings  Diagnostic Dominance: Left Left Main  Ost LM to Dist LM lesion 99% stenosed  Ost LM to Dist LM lesion is 99% stenosed. The lesion was previously treated.  Left Anterior Descending  Ost LAD to Mid LAD lesion 100% stenosed  Ost LAD to Mid LAD lesion is 100% stenosed. The lesion was previously treated.  Mid LAD lesion 50% stenosed  Mid LAD lesion is 50% stenosed.  Second Diagonal Branch  Collaterals  2nd Diag filled by collaterals from 3rd Diag.    Left Circumflex  Ost Cx to Prox Cx lesion 99% stenosed  Ost Cx to Prox Cx lesion is 99% stenosed. The lesion was previously treated.  Third Obtuse Marginal Branch  3rd Mrg lesion 60% stenosed  3rd Mrg lesion is 60% stenosed. The lesion was previously treated.  Left Posterior Atrioventricular Artery  LPAV lesion 90% stenosed  LPAV lesion is 90% stenosed. The lesion was previously treated.  Right Coronary Artery  There is severe diffuse disease throughout the vessel.  LIMA LIMA Graft To Mid LAD  LIMA and is normal in caliber. The graft exhibits no disease.  Intervention  No  interventions have been documented. Coronary Diagrams  Diagnostic Dominance: Left      Ost LAD to Mid LAD lesion is 100% stenosed.  LIMA and is normal in caliber.  The graft exhibits no disease.  Mid LAD lesion is 50% stenosed.  Ost Cx to Prox Cx lesion is 99% stenosed.  Ost LM to Dist LM lesion is 99% stenosed.  3rd Mrg lesion is 60% stenosed.  LPAV lesion is 90% stenosed.   1.  Severe underlying three-vessel coronary artery disease with patent LIMA to LAD.  Chronically occluded SVG to OM.  Severe in-stent restenosis in left main stent extending into the proximal left circumflex.  In addition, there is significant restenosis in the stent placed in the left posterior AV groove artery.  The RCA is known to be small in size and severely diseased. 2.  Left ventricular angiography was not performed due to chronic kidney disease.  LVEDP was 13 mmHg.  Recommendations: This is a difficult management situation overall.  It appears that the patient has very aggressive restenosis in both stents that were placed last year.  Although repeat balloon angioplasty is technically possible, the long-term results might not be durable.  Due to that, I think it is worth obtaining surgical consultation for redo CABG.  I discussed the case with Dr. Einar Bush.  We will transfer the patient to [facility] under Dr. Einar Bush service and we will consult CVTS for consult.  The patient will be continued on ticagrelor at the present time until revascularization decision is made.  Resume heparin 8 hours after sheath pull.  Echo 01/04/19  1. Left ventricular ejection fraction, by visual estimation, is 25 to 30%. The left ventricle has severely decreased function. There is no left ventricular hypertrophy.  2. Definity contrast agent was given IV to delineate the left ventricular endocardial borders.  3. Left ventricular diastolic parameters are consistent with Grade II diastolic dysfunction  (pseudonormalization).  4. Mildly dilated left ventricular internal cavity size.  5. The left ventricle demonstrates global hypokinesis.  6. Global right ventricle was not well visualized.The right ventricular size is not well visualized. Right vetricular wall thickness was not assessed.  7. Left atrial size was mildly dilated.  8. TR signal is inadequate for assessing pulmonary artery systolic pressure.   Laboratory Data:  High Sensitivity Troponin:   Recent Labs  Lab 01/03/19 2132 01/04/19 0044 01/04/19 1553 01/04/19 1710  TROPONINIHS 39* 2,747* 11,045* 9,144*      Cardiac EnzymesNo results for input(s): TROPONINI in the last 168 hours.  No results for input(s): TROPIPOC in the last 168 hours.  Chemistry Recent Labs  Lab 01/06/19 0558 01/07/19 0443  NA 132* 133*  K 4.2 4.5  CL 98 101  CO2 24 23  GLUCOSE 266* 164*  BUN 48* 45*  CREATININE 1.85* 1.50*  CALCIUM 8.7* 9.0  GFRNONAA 38* 49*  GFRAA 44* 57*  ANIONGAP 10 9    Recent Labs  Lab 01/03/19 2132  PROT 8.0  ALBUMIN 4.3  AST 21  ALT 15  ALKPHOS 94  BILITOT 0.6   Hematology Recent Labs  Lab 01/07/19 0443 01/08/19 0535  WBC 7.5 8.0  RBC 4.01* 4.22  HGB 12.1* 12.6*  HCT 35.0* 37.4*  MCV 87.3 88.6  MCH 30.2 29.9  MCHC 34.6 33.7  RDW 12.7 12.7  PLT 189 200   BNP Recent Labs  Lab 01/03/19 2133  BNP 1,262.0*    DDimer No results for input(s): DDIMER in the last 168 hours.   Radiology/Studies:  CARDIAC CATHETERIZATION  Result Date: 01/08/2019  Ost LAD to Mid LAD lesion is 100% stenosed.  LIMA and is normal in caliber.  The graft exhibits no disease.  Mid LAD lesion is 50% stenosed.  Ost Cx to Prox Cx lesion is 99% stenosed.  Ost LM to Dist LM lesion is 99% stenosed.  3rd Mrg lesion is 60% stenosed.  LPAV lesion is 90% stenosed.  1.  Severe underlying three-vessel coronary artery disease with patent LIMA to LAD.  Chronically occluded SVG to OM.  Severe in-stent restenosis in left main stent  extending into the proximal left circumflex.  In addition, there is significant restenosis in the stent placed in the left posterior AV groove artery.  The RCA is known to be small in size and severely diseased. 2.  Left ventricular angiography was not performed due to chronic kidney disease.  LVEDP was 13 mmHg. Recommendations: This is a difficult management situation overall.  It appears that the patient has very aggressive restenosis in both stents that were placed last year.  Although repeat balloon angioplasty is technically possible, the long-term results might not be durable.  Due to that, I think it is worth obtaining surgical consultation for redo CABG.  I discussed the case with Dr. Einar Bush.  We will transfer the patient to [facility] under Dr. Einar Bush service and we will consult CVTS for consult.  The patient will be continued on ticagrelor at the present time until revascularization decision is made.  Resume heparin 8 hours after sheath pull.    Assessment and Plan:   NSTEMI/CAD --Complex history as above in HPI and s/p CABG x2 with interventions to include left main/left circumflex 07/2017. He was reportedly doing well until he developed back pain with dyspnea and hypoxia. In the [facility] ED, he was noted to be in slow WCT/VD and subsequently converted to NSR.  Initially required BiPAP and now on nasal cannula.  HS Tn 1,1405, consistent with NSTEMI, likely leading to slow VT and APE. AKI. Repeat catheterization 12/14 with results as above and recommendation for transfer to [facility].   --Heart healthy and carb modified diet, NPO after midnight. --Resume heparin 8h s/p sheath pull. Daily CBC. --Continue DAPT for now and pending decision regarding repeat CABG. This has been indicated in notes of order for Brilinta.  Continue carvedilol 6.25 mg twice daily.  SL nitro, nitropaste PRN for chest pain. Continue Imdur. Continue statin for risk factor modification with Zetia 10 mg daily.  Patient not  currently on  ACE/ARB/ARNI/AA due to AKI.  Acute on chronic combined systolic/diastolic CHF/acute pulmonary edema --Echo as above. S/p IVF to assist with AKI.  Continue medical management as above. Patient not currently on ACE/ARB/ARNI/AA due to AKI.  Escalate heart failure therapy as renal function tolerates.  AKI with CKDIII --S/p IVF with recommendations as above.  HTN --Continue BB and BP monitoring.  HLD --Continue statin and Zetia.  DM2 --SSI. --Per IM.  VT --As above, slow VT on initial EKG.  On amiodarone at home and has ICD.  Was placed to [employer] for interrogation to assess onset and burden of slow VT.  Continue amiodarone, increased to 400 twice daily. Patient will need monitoring per guidelines on this medications.   Severity of Illness: The appropriate patient status for this patient is INPATIENT. Inpatient status is judged to be reasonable and necessary in order to provide the required intensity of service to ensure the patient's safety. The patient's presenting symptoms, physical exam findings, and initial radiographic and laboratory data in the context of their chronic comorbidities is felt to place them at high risk for further clinical deterioration. Furthermore, it is not anticipated that the patient will be medically stable for discharge from the hospital within 2 midnights of admission. The following factors support the patient status of inpatient.   " The patient's presenting symptoms include Severe 3v CAD. " The worrisome physical exam findings include Severe 3v CAD. " The initial radiographic and laboratory data are worrisome because of Severe 3v CAD. " The chronic co-morbidities include Severe 3v CAD.   * I certify that at the point of admission it is my clinical judgment that the patient will require inpatient hospital care spanning beyond 2 midnights from the point of admission due to high intensity of service, high risk for further deterioration and high  frequency of surveillance required.*    For questions or updates, please contact [place] Please consult [email or web address] for contact info under        Signed, Arvil Chaco, PA-C  01/08/2019 11:43 AM

## 2019-01-08 NOTE — Progress Notes (Signed)
ANTICOAGULATION CONSULT NOTE   Pharmacy Consult for Heparin Indication: chest pain/ACS  Patient Measurements: Height: 5\' 7"  (170.2 cm) Weight: 210 lb 1.6 oz (95.3 kg) IBW/kg (Calculated) : 66.1 HEPARIN DW (KG): 87.4  Vital Signs: Temp: 98.1 F (36.7 C) (12/14 0533) Temp Source: Oral (12/14 0533) BP: 133/75 (12/14 0533) Pulse Rate: 61 (12/14 0533)  Labs: Recent Labs    01/05/19 0726 01/06/19 0558 01/06/19 2246 01/07/19 0443 01/08/19 0535  HGB 12.6* 12.6*  --  12.1* 12.6*  HCT 38.3* 36.8*  --  35.0* 37.4*  PLT 190 180  --  189 200  HEPARINUNFRC  --  0.16* 0.37 0.36 0.53  CREATININE 1.75* 1.85*  --  1.50*  --     Estimated Creatinine Clearance: 55.5 mL/min (A) (by C-G formula based on SCr of 1.5 mg/dL (H)).  Medical History: Past Medical History:  Diagnosis Date  . AICD (automatic cardioverter/defibrillator) present    Medtronic  . Arthritis    "thumbs"  (08/02/2017)  . CHF (congestive heart failure) ([place])   . Chronic combined systolic and diastolic heart failure ([place])    a. 08/2017 Echo: EF 20-25%, mod glob HK. Sev distal ant sept, inflat HK. Apical AK. Gr2 DD. Mildly reduced RV fxn.  . Colon polyps   . Coronary artery disease    a. s/p CABG x 2 (LIMA->LAD, VG->OM); b. Multiple PCI's to LM/LCX/OM; c. 07/2017 PTCA of LM/LCX w/ early ISR-->repeat PCI/DES to LM (3.5x20 Synergy DES) and LCX (3.0x20 Synergy DES); d. 07/2018 Relook Cath: LM patent stent, LAD 100ost, LCX patent stent, OM1 99/60, LIMA->LAD ok. VG->dLCX 9 (old).  . Depression   . Encounter for assessment of implantable cardioverter-defibrillator (ICD) 09/27/2018  . High cholesterol   . Hypertension   . ICD; Biventricular  Medtronic ICD Amplia MRI QWuad CRT-D  in situ 10/29/14 10/29/2014   Remote ICD check 09.23.20:  One 6 beat NSVT. No therapy.  1 SVT episode @ 130 bpm (38 Sec).  There were 23 Vent sense episodes detected for up to 1.1 min/day (AT). Health trends (patient activity, heart rate variability, average  heart rates) are stable.Trans-thoracic impedance trends and the OptiVol Fluid Index do no present significant abnormalities. Battery longevity is 4.3 years. RA pacing is 3  . Ischemic cardiomyopathy 09/27/2018  . MI (myocardial infarction) ([place]) 2003  . NSTEMI (non-ST elevated myocardial infarction) ([place]) 07/16/2014  . OSA on CPAP   . Oxygen deficiency   . Pneumonia 10/2014  . Proteinuria   . Sleep apnea   . 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 amiodarone (PACERONE) 200 MG tablet Take 200 mg by mouth daily.    01/03/2019 at Unknown time  . aspirin EC 81 MG tablet Take 81 mg by mouth daily.   01/03/2019 at Unknown time  . carvedilol (COREG) 12.5 MG tablet Take 6.25 mg by mouth 2 (two) times daily with a meal.   01/03/2019 at Unknown time  . Cholecalciferol (VITAMIN D) 2000 units tablet Take 2,000 Units by mouth 2 (two) times daily.   01/03/2019 at Unknown time  . empagliflozin (JARDIANCE) 25 MG TABS tablet Take 25 mg by mouth daily.   01/03/2019 at Unknown time  . ezetimibe (ZETIA) 10 MG tablet Take 1 tablet (10 mg total) by mouth daily. (Patient taking differently: Take 10 mg by mouth at bedtime. ) 90 tablet 3 01/02/2019 at Unknown time  .  insulin aspart protamine - aspart (NOVOLOG MIX 70/30 FLEXPEN) (70-30) 100 UNIT/ML FlexPen Inject 63 Units into the skin 2 (two) times daily as needed (as needed for blood sugar over 200). as needed for blood sugar over 200   01/03/2019 at Unknown time  . isosorbide mononitrate (IMDUR) 30 MG 24 hr tablet Take 30 mg by mouth daily.   01/03/2019 at Unknown time  . liraglutide (VICTOZA) 18 MG/3ML SOPN Inject 1.8 mg into the skin daily.   01/03/2019 at Unknown time  . metFORMIN (GLUCOPHAGE-XR) 500 MG 24 hr tablet Take 500 mg by mouth 2 (two) times daily.    01/03/2019 at Unknown time  . metolazone (ZAROXOLYN) 2.5 MG tablet Take 2.5 mg by mouth daily as needed (for  >2 lb weight gain).   prn at prn  . Omega-3 Fatty Acids (FISH OIL) 1000 MG CAPS Take 1,000 mg by mouth 2 (two) times daily.    01/03/2019 at Unknown time  . potassium chloride SA (K-DUR,KLOR-CON) 20 MEQ tablet Take 1 tablet (20 mEq total) by mouth 2 (two) times daily. 60 tablet 3 01/03/2019 at Unknown time  . PRESCRIPTION MEDICATION Inhale into the lungs at bedtime. CPAP   01/02/2019 at Unknown time  . rosuvastatin (CRESTOR) 40 MG tablet Take 40 mg by mouth at bedtime.   01/02/2019 at Unknown time  . sacubitril-valsartan (ENTRESTO) 97-103 MG Take 1 tablet by mouth 2 (two) times daily.    01/03/2019 at Unknown time  . sertraline (ZOLOFT) 100 MG tablet Take 50 mg by mouth at bedtime.    01/02/2019 at Unknown time  . ticagrelor (BRILINTA) 90 MG TABS tablet Take 1 tablet (90 mg total) by mouth 2 (two) times daily. 60 tablet 3 01/03/2019 at Unknown time  . torsemide (DEMADEX) 20 MG tablet Take 1 tablet (20 mg total) by mouth 2 (two) times daily. (Patient taking differently: Take 20 mg by mouth 2 (two) times daily as needed (fluid). ) 180 tablet 3 prn at prn   Assessment: Pharmacy asked to initiate and monitor Heparin for ACS/NSTEMI.   Pt not on anticoagulants per med list.  H&H, PLT trending down from baseline. The plan is to follow creatinine this weekend and likely pursue cath on Monday 12/14   Heparin Course: 12/10 initiation: 4000 units bolus, then 1000 units/hr 12/10 1800 HL 0.41: no change 12/11 0000 HL 0.25: rate inc to 1300 units/hr 12/11 1017 HL 0.43: therapeutic x 1 12/11 1712 HL 0.34: therapeutic x 2 12/12 0558 HL 0.16: subtherapeutic, confirmed w/ RN no issues w/ infusion. 12/12 1454 HL 0.35 therapeutic x 1 @ 1500 units/hr 12/12 2246 HL 0.37 therapeutic x 2 @ 1500 units/hr 12/13 0443 HL 0.36 therapeutic x 3 @ 1500 units/hr, CBC stable. 12/14 0535 HL 0.53 therapeutic x 4 @ 1500 units/hr, CBC stable  Goal of Therapy:  Heparin level 0.3-0.7 units/ml Monitor platelets by anticoagulation  protocol: Yes   Plan:   Heparin level therapeutic x 3, will continue 1500 units/hr, recheck qam    HL/CBC in am   Ena Dawley, PharmD Clinical Pharmacist 01/08/2019 6:50 AM

## 2019-01-08 NOTE — Care Management Important Message (Signed)
Important Message  Patient Details  Name: Devlyn Placek MRN: [id number] Date of Birth: [birth year]   Medicare Important Message Given:  Yes     Dannette Barbara 01/08/2019, 3:45 PM

## 2019-01-08 NOTE — Progress Notes (Signed)
[place] attempted to give report. Informed currently in handoff and to callback.   [place] RN attempted report during handoff. Informed this RN and charge RN unavailable to take report.

## 2019-01-08 NOTE — Discharge Summary (Signed)
Martin Bush [id number] DOB: [birth year] DOA: 01/03/2019  PCP: [name], MD  Admit date: 01/03/2019 Discharge date: 01/08/2019  Admitted From: Home Disposition:  [facility]  Recommendations for Outpatient Follow-up:  1. Follow up with PCP in 1 week 2. Please obtain BMP/CBC in one week 3. 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 NP was elevated at 1200 and initial troponin was elevated at 39. Cell count was elevated at 15,000. Peak troponin after 2 hours is 2700.CTA chest showed no PE but did show bilateral pleural effusions and pulmonary edema.  Echocardiogram was obtained revealing EF of 25 to 30%.  He was started on IV Lasix with good urine output.  His creatinine did increase and his Entresto and Lasix were held.  Over the weekend he did not have any chest pain or shortness of breath and he was able to lay flat in bed.  We were waiting for his creatinine to improve for cardiac catheterization today.  Also he had some slow VT on initial ECG and his amiodarone was increased to 400 twice daily.  It was thought DVT presumably is ischemic in etiology given that he came in with non-STEMI.  He was also tested for Covid which was.  On admission he was started on heparin drip.  His renal function improved to baseline.  He had  catheterization today that revealed severe in-stent restenosis in the left main and left circumflex and a decision by cardiology has been made to transfer the patient to [facility] under Dr. Irven Shelling service for surgical evaluation.  From his diabetes standpoint it is uncontrolled with the last A1c of 11.2.  He is going to be continued on insulin sliding scale and his p.o. antiglycemic medications are on hold.  Patient was educated on low-sodium diet as he does eat a lot of salty food upon questioning.  Patient seen and examined this morning after cardiac catheterization.  He is stable to be transferred to [facility].  He denies any shortness of breath or chest pain.  Discharge Diagnoses:  Principal Problem:   NSTEMI (non-ST elevated myocardial infarction) ([place]) Active Problems:   Insulin dependent type 2 diabetes mellitus ([place])   HTN (hypertension), benign   CKD (chronic kidney disease) stage 3, GFR 30-59 ml/min   Acute on chronic combined systolic and diastolic CHF (congestive heart failure) (HCC)   Hx of CABG   Acute respiratory failure [facility])    Discharge Instructions  Discharge Instructions    Diet - low sodium heart healthy   Complete by: As directed    Increase activity slowly   Complete by: As directed      Allergies as of 01/08/2019      Reactions   Diltiazem Rash      Medication List    STOP taking these medications   Entresto 97-103 MG Generic drug: sacubitril-valsartan   Jardiance 25 MG Tabs tablet Generic drug:  empagliflozin   metFORMIN 500 MG 24 hr tablet Commonly known as: GLUCOPHAGE-XR   metolazone 2.5 MG tablet Commonly known as: ZAROXOLYN   NovoLOG Mix 70/30 FlexPen (70-30) 100 UNIT/ML FlexPen Generic drug: insulin aspart protamine - aspart Replaced by: insulin aspart protamine- aspart (70-30) 100 UNIT/ML injection   torsemide 20 MG tablet Commonly known as: DEMADEX     TAKE these medications   acetaminophen 325 MG tablet Commonly known as:  TYLENOL Take 2 tablets (650 mg total) by mouth every 4 (four) hours as needed for headache or mild pain.   amiodarone 400 MG tablet Commonly known as: PACERONE Take 1 tablet (400 mg total) by mouth 2 (two) times daily. What changed:   medication strength  how much to take  when to take this   aspirin 81 MG EC tablet Take 1 tablet (81 mg total) by mouth daily.   carvedilol 6.25 MG tablet Commonly known as: COREG Take 1 tablet (6.25 mg total) by mouth 2 (two) times daily with a meal. What changed: medication strength   ezetimibe 10 MG tablet Commonly known as: ZETIA Take 1 tablet (10 mg total) by mouth at bedtime.   Fish Oil 1000 MG Caps Take 1,000 mg by mouth 2 (two) times daily.   insulin aspart 100 UNIT/ML injection Commonly known as: novoLOG Inject 0-15 Units into the skin 3 (three) times daily with meals.   insulin aspart 100 UNIT/ML injection Commonly known as: novoLOG Inject 0-5 Units into the skin at bedtime.   insulin aspart protamine- aspart (70-30) 100 UNIT/ML injection Commonly known as: NOVOLOG MIX 70/30 Inject 0.25 mLs (25 Units total) into the skin 2 (two) times daily with a meal. Replaces: NovoLOG Mix 70/30 FlexPen (70-30) 100 UNIT/ML FlexPen   isosorbide mononitrate 30 MG 24 hr tablet Commonly known as: IMDUR Take 1 tablet (30 mg total) by mouth daily.   nitroGLYCERIN 2 % ointment Commonly known as: NITROGLYN Apply 1 inch topically every 6 (six) hours.   nitroGLYCERIN 0.4 MG SL tablet Commonly known as: NITROSTAT Place 1 tablet (0.4 mg total) under the tongue every 5 (five) minutes x 3 doses as needed for chest pain.   ondansetron 4 MG/2ML Soln injection Commonly known as: ZOFRAN Inject 2 mLs (4 mg total) into the vein every 6 (six) hours as needed for nausea.   potassium chloride SA 20 MEQ tablet Commonly known as: KLOR-CON Take 1 tablet (20 mEq total) by mouth 2 (two) times daily.   PRESCRIPTION MEDICATION Inhale into the lungs at  bedtime. CPAP   rosuvastatin 40 MG tablet Commonly known as: CRESTOR Take 1 tablet (40 mg total) by mouth at bedtime.   sertraline 50 MG tablet Commonly known as: ZOLOFT Take 1 tablet (50 mg total) by mouth at bedtime. What changed: medication strength   ticagrelor 90 MG Tabs tablet Commonly known as: BRILINTA Take 1 tablet (90 mg total) by mouth 2 (two) times daily.   Victoza 18 MG/3ML Sopn Generic drug: liraglutide Inject 1.8 mg into the skin daily.   Vitamin D3 25 MCG tablet Commonly known as: Vitamin D Take 2 tablets (2,000 Units total) by mouth 2 (two) times daily. What changed: medication strength      Follow-up Information    [facility] Follow up on 01/15/2019.   Specialty: Cardiology Why: at 2:00pm. Enter through the [place] entrance Contact information: [place] [phone]  Allergies  Allergen Reactions  . Diltiazem Rash    Consultations:  Cardiology   Procedures/Studies: CT ANGIO CHEST PE W OR WO CONTRAST  Result Date: 01/04/2019 CLINICAL DATA:  PE suspected, intermediate prob, positive D-dimer. Respiratory distress. Hypoxia. EXAM: CT ANGIOGRAPHY CHEST WITH CONTRAST TECHNIQUE: Multidetector CT imaging of the chest was performed using the standard protocol during bolus administration of intravenous contrast. Multiplanar CT image reconstructions and MIPs were obtained to evaluate the vascular anatomy. CONTRAST:  175mL OMNIPAQUE IOHEXOL 350 MG/ML SOLN COMPARISON:  Chest radiograph earlier this day. No prior chest CT. FINDINGS: Cardiovascular: There are no filling defects within the pulmonary arteries to suggest pulmonary embolus. Moderate multi chamber cardiomegaly. Left-sided pacemaker with leads in the right atrium and ventricle. Post CABG. Atherosclerosis of the thoracic aorta. Cannot assess for dissection given phase of contrast tailored to  pulmonary artery evaluation. There is no pericardial effusion. Mediastinum/Nodes: Shotty mediastinal lymph nodes with 9 mm right lower paratracheal node. There is a 12 mm right hilar node. No visualized thyroid nodule. No esophageal thickening. Lungs/Pleura: Small right and trace left pleural effusion. Adjacent atelectasis in the right lower lobe. Smooth septal thickening with patchy ground-glass opacities within both lungs, slightly more prominent on the right. There is fluid within fissures bilaterally. Mild lower lobe bronchial thickening is likely congestive. No evidence of pulmonary mass. Trachea and mainstem bronchi are patent. Upper Abdomen: Fluid distended stomach without gastric wall thickening. Musculoskeletal: Post median sternotomy. Degenerative change in the spine. There are no acute or suspicious osseous abnormalities. Review of the MIP images confirms the above findings. IMPRESSION: 1. No pulmonary embolus. 2. CHF. Cardiomegaly with small bilateral pleural effusions and pulmonary edema. Ground-glass opacities in both lungs are most consistent with pulmonary edema, an element of superimposed atypical viral pneumonia cannot be excluded on imaging findings alone, but is felt less likely. 3. Shotty mediastinal and right hilar lymph nodes are likely reactive. Aortic Atherosclerosis (ICD10-I70.0). Electronically Signed   By: Keith Rake M.D.   On: 01/04/2019 00:07   CARDIAC CATHETERIZATION  Result Date: 01/08/2019  Ost LAD to Mid LAD lesion is 100% stenosed.  LIMA and is normal in caliber.  The graft exhibits no disease.  Mid LAD lesion is 50% stenosed.  Ost Cx to Prox Cx lesion is 99% stenosed.  Ost LM to Dist LM lesion is 99% stenosed.  3rd Mrg lesion is 60% stenosed.  LPAV lesion is 90% stenosed.  1.  Severe underlying three-vessel coronary artery disease with patent LIMA to LAD.  Chronically occluded SVG to OM.  Severe in-stent restenosis in left main stent extending into the proximal  left circumflex.  In addition, there is significant restenosis in the stent placed in the left posterior AV groove artery.  The RCA is known to be small in size and severely diseased. 2.  Left ventricular angiography was not performed due to chronic kidney disease.  LVEDP was 13 mmHg. Recommendations: This is a difficult management situation overall.  It appears that the patient has very aggressive restenosis in both stents that were placed last year.  Although repeat balloon angioplasty is technically possible, the long-term results might not be durable.  Due to that, I think it is worth obtaining surgical consultation for redo CABG.  I discussed the case with Dr. Einar Gip.  We will transfer the patient to [facility] under Dr. Einar Gip service and we will consult CVTS for consult.  The patient will be continued on ticagrelor at the present time until revascularization decision  is made.  Resume heparin 8 hours after sheath pull.   DG Chest Portable 1 View  Result Date: 01/03/2019 CLINICAL DATA:  Acute respiratory distress, O2 sat 70-80% upon EMS arrival EXAM: PORTABLE CHEST 1 VIEW COMPARISON:  Radiograph 08/20/2017 FINDINGS: Postsurgical changes related to prior CABG including intact and aligned sternotomy wires and multiple surgical clips projecting over the mediastinum. Coronary stent overlies the cardiac silhouette. Three lead pacer/defibrillator pack overlies the left chest wall with leads at the apex, right atrium, and coronary sinus. Telemetry leads overlie the chest. Diffuse interstitial and airspace opacities throughout the lungs with some fluid thickening the right minor fissure. There is marked cardiomegaly, increased from comparison. No visible pneumothorax or evidence of left pleural fluid. No acute osseous or soft tissue abnormality. IMPRESSION: Marked cardiomegaly with diffuse interstitial and airspace opacities throughout the lungs. The findings are most compatible with moderate to severe  CHF/volume overload. Please note, infection or inflammation could present with similar findings. Electronically Signed   By: Lovena Le M.D.   On: 01/03/2019 22:14   ECHOCARDIOGRAM COMPLETE  Result Date: 01/04/2019   ECHOCARDIOGRAM REPORT   Patient Name:   HERRICK HAFEMAN Date of Exam: 01/04/2019 Medical Rec #:  [id number]     Height:       67.0 in Accession #:    [id number]    Weight:       216.0 lb Date of Birth:  18-Sep-1955    BSA:          2.09 m Patient Age:    63 years      BP:           119/81 mmHg Patient Gender: M             HR:           69 bpm. Exam Location:  [facility] Procedure: 2D Echo, Cardiac Doppler, Color Doppler and Intracardiac            Opacification Agent Indications:     Acute coronary syndrome 124.9  History:         Patient has prior history of Echocardiogram examinations, most                  recent 09/01/2017. AICD. Ischemic cardiomyopathy, MI, NSTEMI,                  OSA.  Sonographer:     Sherrie Sport RDCS (AE) Referring Phys:  [place] Diagnosing Phys: Ida Rogue MD  Sonographer Comments: Technically difficult study due to poor echo windows. IMPRESSIONS  1. Left ventricular ejection fraction, by visual estimation, is 25 to 30%. The left ventricle has severely decreased function. There is no left ventricular hypertrophy.  2. Definity contrast agent was given IV to delineate the left ventricular endocardial borders.  3. Left ventricular diastolic parameters are consistent with Grade II diastolic dysfunction (pseudonormalization).  4. Mildly dilated left ventricular internal cavity size.  5. The left ventricle demonstrates global hypokinesis.  6. Global right ventricle was not well visualized.The right ventricular size is not well visualized. Right vetricular wall thickness was not assessed.  7. Left atrial size was mildly dilated.  8. TR signal is inadequate for assessing pulmonary artery systolic pressure. FINDINGS  Left Ventricle: Left ventricular ejection fraction, by  visual estimation, is 25 to 30%. The left ventricle has severely decreased function. Definity contrast agent was given IV to delineate the left ventricular endocardial borders. The left ventricle demonstrates global hypokinesis. The  left ventricular internal cavity size was mildly dilated left ventricle. There is no left ventricular hypertrophy. Left ventricular diastolic parameters are consistent with Grade II diastolic dysfunction (pseudonormalization). Normal left atrial pressure. Right Ventricle: The right ventricular size is not well visualized. Right vetricular wall thickness was not assessed. Global RV systolic function is was not well visualized. The tricuspid regurgitant velocity is 1.32 m/s, and with an assumed right atrial  pressure of 10 mmHg, the estimated right ventricular systolic pressure is TR signal is inadequate for assessing PA pressure at 17.0 mmHg. Left Atrium: Left atrial size was mildly dilated. Right Atrium: Right atrial size was normal in size Pericardium: There is no evidence of pericardial effusion. Mitral Valve: The mitral valve is normal in structure. Mild mitral valve regurgitation. No evidence of mitral valve stenosis by observation. Tricuspid Valve: The tricuspid valve is normal in structure. Tricuspid valve regurgitation is not demonstrated. Aortic Valve: The aortic valve was not well visualized. Aortic valve regurgitation is not visualized. Mild to moderate aortic valve sclerosis/calcification is present, without any evidence of aortic stenosis. Aortic valve mean gradient measures 1.5 mmHg.  Aortic valve peak gradient measures 2.7 mmHg. Aortic valve area, by VTI measures 2.97 cm. Pulmonic Valve: The pulmonic valve was normal in structure. Pulmonic valve regurgitation is not visualized. Pulmonic regurgitation is not visualized. Aorta: The aortic root, ascending aorta and aortic arch are all structurally normal, with no evidence of dilitation or obstruction. Venous: The inferior  vena cava is normal in size with greater than 50% respiratory variability, suggesting right atrial pressure of 3 mmHg. IAS/Shunts: No atrial level shunt detected by color flow Doppler. There is no evidence of a patent foramen ovale. No ventricular septal defect is seen or detected. There is no evidence of an atrial septal defect.  LEFT VENTRICLE PLAX 2D LVIDd:         5.60 cm       Diastology LVIDs:         5.01 cm       LV e' lateral:   5.00 cm/s LV PW:         1.10 cm       LV E/e' lateral: 16.5 LV IVS:        1.05 cm       LV e' medial:    4.13 cm/s LVOT diam:     2.20 cm       LV E/e' medial:  20.0 LV SV:         35 ml LV SV Index:   15.93 LVOT Area:     3.80 cm  LV Volumes (MOD) LV area d, A2C:    36.10 cm LV area d, A4C:    43.40 cm LV area s, A2C:    30.80 cm LV area s, A4C:    38.10 cm LV major d, A2C:   7.77 cm LV major d, A4C:   8.55 cm LV major s, A2C:   7.78 cm LV major s, A4C:   8.10 cm LV vol d, MOD A2C: 144.0 ml LV vol d, MOD A4C: 187.0 ml LV vol s, MOD A2C: 107.0 ml LV vol s, MOD A4C: 149.0 ml LV SV MOD A2C:     37.0 ml LV SV MOD A4C:     187.0 ml LV SV MOD BP:      44.5 ml RIGHT VENTRICLE RV Basal diam:  3.73 cm RV S prime:     8.05 cm/s TAPSE (M-mode): 3.8 cm LEFT ATRIUM  Index       RIGHT ATRIUM           Index LA diam:        4.30 cm  2.06 cm/m  RA Area:     22.00 cm LA Vol (A2C):   58.3 ml  27.90 ml/m RA Volume:   62.60 ml  29.96 ml/m LA Vol (A4C):   104.0 ml 49.77 ml/m LA Biplane Vol: 84.9 ml  40.63 ml/m  AORTIC VALVE                   PULMONIC VALVE AV Area (Vmax):    3.17 cm    PV Vmax:        0.54 m/s AV Area (Vmean):   2.68 cm    PV Peak grad:   1.2 mmHg AV Area (VTI):     2.97 cm    RVOT Peak grad: 1 mmHg AV Vmax:           82.50 cm/s AV Vmean:          54.150 cm/s AV VTI:            0.158 m AV Peak Grad:      2.7 mmHg AV Mean Grad:      1.5 mmHg LVOT Vmax:         68.70 cm/s LVOT Vmean:        38.200 cm/s LVOT VTI:          0.124 m LVOT/AV VTI ratio: 0.78  AORTA  Ao Root diam: 2.90 cm MITRAL VALVE                        TRICUSPID VALVE MV Area (PHT): 4.10 cm             TR Peak grad:   7.0 mmHg MV PHT:        53.65 msec           TR Vmax:        132.00 cm/s MV Decel Time: 185 msec MV E velocity: 82.50 cm/s 103 cm/s  SHUNTS MV A velocity: 56.80 cm/s 70.3 cm/s Systemic VTI:  0.12 m MV E/A ratio:  1.45       1.5       Systemic Diam: 2.20 cm  Ida Rogue MD Electronically signed by Ida Rogue MD Signature Date/Time: 01/04/2019/6:49:04 PM    Final (Updated)        Subjective:  Denies any chest pain or shortness of breath status post Discharge Exam: Vitals:   01/08/19 1015 01/08/19 1030  BP: 128/71 116/68  Pulse: 60 60  Resp: 14 12  Temp:    SpO2: 94% 95%   Vitals:   01/08/19 0945 01/08/19 1000 01/08/19 1015 01/08/19 1030  BP: 129/70 122/69 128/71 116/68  Pulse: (!) 59 (!) 59 60 60  Resp: 14 19 14 12   Temp:      TempSrc:      SpO2: 95% 96% 94% 95%  Weight:      Height:        General: Pt is alert, awake, not in acute distress Cardiovascular: RRR, S1/S2 +, no rubs, no gallops Respiratory: CTA bilaterally, no wheezing, no rhonchi Abdominal: Soft, NT, ND, bowel sounds + Extremities: no edema, no cyanosis    The results of significant diagnostics from this hospitalization (including imaging, microbiology, ancillary and laboratory) are listed below for reference.     Microbiology: Recent Results (from the past  240 hour(s))  Blood culture (routine x 2)     Status: None   Collection Time: 01/03/19  9:32 PM   Specimen: BLOOD  Result Value Ref Range Status   Specimen Description BLOOD BLOOD LEFT HAND  Final   Special Requests   Final    BOTTLES DRAWN AEROBIC AND ANAEROBIC Blood Culture adequate volume   Culture   Final    NO GROWTH 5 DAYS Performed at [facility], [address], [place], [place] [address]    Report Status 01/08/2019 FINAL  Final  Blood culture (routine x 2)     Status: None   Collection Time:  01/03/19  9:32 PM   Specimen: BLOOD  Result Value Ref Range Status   Specimen Description BLOOD BLOOD LEFT HAND  Final   Special Requests   Final    BOTTLES DRAWN AEROBIC AND ANAEROBIC Blood Culture results may not be optimal due to an excessive volume of blood received in culture bottles   Culture   Final    NO GROWTH 5 DAYS Performed at [facility], [address] [place], [place] [address]    Report Status 01/08/2019 FINAL  Final  Respiratory Panel by RT PCR (Flu A&B, Covid) - Nasopharyngeal Swab     Status: None   Collection Time: 01/03/19 10:49 PM   Specimen: Nasopharyngeal Swab  Result Value Ref Range Status   SARS Coronavirus 2 by RT PCR NEGATIVE NEGATIVE Final    Comment: (NOTE) SARS-CoV-2 target nucleic acids are NOT DETECTED. The SARS-CoV-2 RNA is generally detectable in upper respiratoy specimens during the acute phase of infection. The lowest concentration of SARS-CoV-2 viral copies this assay can detect is 131 copies/mL. A negative result does not preclude SARS-Cov-2 infection and should not be used as the sole basis for treatment or other patient management decisions. A negative result may occur with  improper specimen collection/handling, submission of specimen other than nasopharyngeal swab, presence of viral mutation(s) within the areas targeted by this assay, and inadequate number of viral copies (<131 copies/mL). A negative result must be combined with clinical observations, patient history, and epidemiological information. The expected result is Negative. Fact Sheet for Patients:  PinkCheek.be Fact Sheet for Healthcare Providers:  GravelBags.it This test is not yet ap proved or cleared by the [place] FDA and  has been authorized for detection and/or diagnosis of SARS-CoV-2 by FDA under an Emergency Use Authorization (EUA). This EUA will remain  in effect (meaning this test can be used)  for the duration of the COVID-19 declaration under Section 564(b)(1) of the Act, 21 U.S.C. section 360bbb-3(b)(1), unless the authorization is terminated or revoked sooner.    Influenza A by PCR NEGATIVE NEGATIVE Final   Influenza B by PCR NEGATIVE NEGATIVE Final    Comment: (NOTE) The Xpert Xpress SARS-CoV-2/FLU/RSV assay is intended as an aid in  the diagnosis of influenza from Nasopharyngeal swab specimens and  should not be used as a sole basis for treatment. Nasal washings and  aspirates are unacceptable for Xpert Xpress SARS-CoV-2/FLU/RSV  testing. Fact Sheet for Patients: PinkCheek.be Fact Sheet for Healthcare Providers: GravelBags.it This test is not yet approved or cleared by the [place] FDA and  has been authorized for detection and/or diagnosis of SARS-CoV-2 by  FDA under an Emergency Use Authorization (EUA). This EUA will remain  in effect (meaning this test can be used) for the duration of the  Covid-19 declaration under Section 564(b)(1) of the Act, 21  U.S.C. section  360bbb-3(b)(1), unless the authorization is  terminated or revoked. Performed at [facility], [address] [place], [place] [address]   MRSA PCR Screening     Status: None   Collection Time: 01/04/19  7:02 PM   Specimen: Nasal Mucosa; Nasopharyngeal  Result Value Ref Range Status   MRSA by PCR NEGATIVE NEGATIVE Final    Comment:        The GeneXpert MRSA Assay (FDA approved for NASAL specimens only), is one component of a comprehensive MRSA colonization surveillance program. It is not intended to diagnose MRSA infection nor to guide or monitor treatment for MRSA infections. Performed at [facility], [address] [place], [place] [address]      Labs: BNP (last 3 results) Recent Labs    01/03/19 2133  BNP [id number]*   Basic Metabolic Panel: Recent Labs  Lab 01/03/19 2132 01/04/19 0640  01/05/19 0726 01/06/19 0558 01/07/19 0443  NA 134* 136 134* 132* 133*  K 3.9 4.4 3.5 4.2 4.5  CL 101 101 98 98 101  CO2 20* 25 24 24 23   GLUCOSE 573* 370* 147* 266* 164*  BUN 20 23 34* 48* 45*  CREATININE 1.54* 1.41* 1.75* 1.85* 1.50*  CALCIUM 9.1 8.9 8.4* 8.7* 9.0   Liver Function Tests: Recent Labs  Lab 01/03/19 2132  AST 21  ALT 15  ALKPHOS 94  BILITOT 0.6  PROT 8.0  ALBUMIN 4.3   No results for input(s): LIPASE, AMYLASE in the last 168 hours. No results for input(s): AMMONIA in the last 168 hours. CBC: Recent Labs  Lab 01/03/19 2132 01/04/19 0640 01/05/19 0726 01/06/19 0558 01/07/19 0443 01/08/19 0535  WBC 15.8* 9.3 9.1 7.8 7.5 8.0  NEUTROABS 9.5*  --   --   --   --   --   HGB 15.2 13.3 12.6* 12.6* 12.1* 12.6*  HCT 47.5 40.0 38.3* 36.8* 35.0* 37.4*  MCV 95.8 91.3 92.5 87.8 87.3 88.6  PLT 312 185 190 180 189 200   Cardiac Enzymes: No results for input(s): CKTOTAL, CKMB, CKMBINDEX, TROPONINI in the last 168 hours. BNP: Invalid input(s): POCBNP CBG: Recent Labs  Lab 01/07/19 0747 01/07/19 1137 01/07/19 1710 01/07/19 2103 01/08/19 0723  GLUCAP 164* 273* 225* 215* 141*   D-Dimer No results for input(s): DDIMER in the last 72 hours. Hgb A1c No results for input(s): HGBA1C in the last 72 hours. Lipid Profile No results for input(s): CHOL, HDL, LDLCALC, TRIG, CHOLHDL, LDLDIRECT in the last 72 hours. Thyroid function studies No results for input(s): TSH, T4TOTAL, T3FREE, THYROIDAB in the last 72 hours.  Invalid input(s): FREET3 Anemia work up No results for input(s): VITAMINB12, FOLATE, FERRITIN, TIBC, IRON, RETICCTPCT in the last 72 hours. Urinalysis    Component Value Date/Time   COLORURINE YELLOW 08/02/2017 1444   APPEARANCEUR CLEAR 08/02/2017 1444   LABSPEC 1.012 08/02/2017 1444   PHURINE 5.0 08/02/2017 1444   GLUCOSEU >=500 (A) 08/02/2017 1444   HGBUR NEGATIVE 08/02/2017 1444   BILIRUBINUR NEGATIVE 08/02/2017 1444   KETONESUR NEGATIVE  08/02/2017 1444   PROTEINUR NEGATIVE 08/02/2017 1444   NITRITE NEGATIVE 08/02/2017 1444   LEUKOCYTESUR NEGATIVE 08/02/2017 1444   Sepsis Labs Invalid input(s): PROCALCITONIN,  WBC,  LACTICIDVEN Microbiology Recent Results (from the past 240 hour(s))  Blood culture (routine x 2)     Status: None   Collection Time: 01/03/19  9:32 PM   Specimen: BLOOD  Result Value Ref Range Status   Specimen Description BLOOD BLOOD LEFT HAND  Final  Special Requests   Final    BOTTLES DRAWN AEROBIC AND ANAEROBIC Blood Culture adequate volume   Culture   Final    NO GROWTH 5 DAYS Performed at [facility], [address] [place], [place] [address]    Report Status 01/08/2019 FINAL  Final  Blood culture (routine x 2)     Status: None   Collection Time: 01/03/19  9:32 PM   Specimen: BLOOD  Result Value Ref Range Status   Specimen Description BLOOD BLOOD LEFT HAND  Final   Special Requests   Final    BOTTLES DRAWN AEROBIC AND ANAEROBIC Blood Culture results may not be optimal due to an excessive volume of blood received in culture bottles   Culture   Final    NO GROWTH 5 DAYS Performed at [facility], [address], [place], [place] [address]    Report Status 01/08/2019 FINAL  Final  Respiratory Panel by RT PCR (Flu A&B, Covid) - Nasopharyngeal Swab     Status: None   Collection Time: 01/03/19 10:49 PM   Specimen: Nasopharyngeal Swab  Result Value Ref Range Status   SARS Coronavirus 2 by RT PCR NEGATIVE NEGATIVE Final    Comment: (NOTE) SARS-CoV-2 target nucleic acids are NOT DETECTED. The SARS-CoV-2 RNA is generally detectable in upper respiratoy specimens during the acute phase of infection. The lowest concentration of SARS-CoV-2 viral copies this assay can detect is 131 copies/mL. A negative result does not preclude SARS-Cov-2 infection and should not be used as the sole basis for treatment or other patient management decisions. A negative result may occur with   improper specimen collection/handling, submission of specimen other than nasopharyngeal swab, presence of viral mutation(s) within the areas targeted by this assay, and inadequate number of viral copies (<131 copies/mL). A negative result must be combined with clinical observations, patient history, and epidemiological information. The expected result is Negative. Fact Sheet for Patients:  PinkCheek.be Fact Sheet for Healthcare Providers:  GravelBags.it This test is not yet ap proved or cleared by the [place] FDA and  has been authorized for detection and/or diagnosis of SARS-CoV-2 by FDA under an Emergency Use Authorization (EUA). This EUA will remain  in effect (meaning this test can be used) for the duration of the COVID-19 declaration under Section 564(b)(1) of the Act, 21 U.S.C. section 360bbb-3(b)(1), unless the authorization is terminated or revoked sooner.    Influenza A by PCR NEGATIVE NEGATIVE Final   Influenza B by PCR NEGATIVE NEGATIVE Final    Comment: (NOTE) The Xpert Xpress SARS-CoV-2/FLU/RSV assay is intended as an aid in  the diagnosis of influenza from Nasopharyngeal swab specimens and  should not be used as a sole basis for treatment. Nasal washings and  aspirates are unacceptable for Xpert Xpress SARS-CoV-2/FLU/RSV  testing. Fact Sheet for Patients: PinkCheek.be Fact Sheet for Healthcare Providers: GravelBags.it This test is not yet approved or cleared by the [place] FDA and  has been authorized for detection and/or diagnosis of SARS-CoV-2 by  FDA under an Emergency Use Authorization (EUA). This EUA will remain  in effect (meaning this test can be used) for the duration of the  Covid-19 declaration under Section 564(b)(1) of the Act, 21  U.S.C. section 360bbb-3(b)(1), unless the authorization is  terminated or revoked. Performed at  [facility], [address] [place], [place] [address]   MRSA PCR Screening     Status: None   Collection Time: 01/04/19  7:02 PM   Specimen: Nasal  Mucosa; Nasopharyngeal  Result Value Ref Range Status   MRSA by PCR NEGATIVE NEGATIVE Final    Comment:        The GeneXpert MRSA Assay (FDA approved for NASAL specimens only), is one component of a comprehensive MRSA colonization surveillance program. It is not intended to diagnose MRSA infection nor to guide or monitor treatment for MRSA infections. Performed at [facility], [address], [place], [place] [address]      Time coordinating discharge: Over 30 minutes  SIGNED:   Nolberto Hanlon, MD  [facility] 01/08/2019, 10:54 AM Pager   If 7PM-7AM, please contact night-coverage [email or web address] Password [email or web address]

## 2019-01-08 NOTE — Progress Notes (Signed)
ANTICOAGULATION CONSULT NOTE   Pharmacy Consult for Heparin Indication: chest pain/ACS  Patient Measurements: Height: 5\' 7"  (170.2 cm) Weight: 210 lb 1.6 oz (95.3 kg) IBW/kg (Calculated) : 66.1 HEPARIN DW (KG): 87.4  Vital Signs: Temp: 98 F (36.7 C) (12/14 0746) Temp Source: Oral (12/14 0746) BP: 143/79 (12/14 1139) Pulse Rate: 61 (12/14 1139)  Labs: Recent Labs    01/06/19 0558 01/06/19 2246 01/07/19 0443 01/08/19 0535  HGB 12.6*  --  12.1* 12.6*  HCT 36.8*  --  35.0* 37.4*  PLT 180  --  189 200  HEPARINUNFRC 0.16* 0.37 0.36 0.53  CREATININE 1.85*  --  1.50*  --     Estimated Creatinine Clearance: 55.5 mL/min (A) (by C-G formula based on SCr of 1.5 mg/dL (H)).  Medical History: Past Medical History:  Diagnosis Date  . AICD (automatic cardioverter/defibrillator) present    Medtronic  . Arthritis    "thumbs"  (08/02/2017)  . CHF (congestive heart failure) ([place])   . Chronic combined systolic and diastolic heart failure ([place])    a. 08/2017 Echo: EF 20-25%, mod glob HK. Sev distal ant sept, inflat HK. Apical AK. Gr2 DD. Mildly reduced RV fxn.  . Colon polyps   . Coronary artery disease    a. s/p CABG x 2 (LIMA->LAD, VG->OM); b. Multiple PCI's to LM/LCX/OM; c. 07/2017 PTCA of LM/LCX w/ early ISR-->repeat PCI/DES to LM (3.5x20 Synergy DES) and LCX (3.0x20 Synergy DES); d. 07/2018 Relook Cath: LM patent stent, LAD 100ost, LCX patent stent, OM1 99/60, LIMA->LAD ok. VG->dLCX 48 (old).  . Depression   . Encounter for assessment of implantable cardioverter-defibrillator (ICD) 09/27/2018  . High cholesterol   . Hypertension   . ICD; Biventricular  Medtronic ICD Amplia MRI QWuad CRT-D  in situ 10/29/14 10/29/2014   Remote ICD check 09.23.20:  One 6 beat NSVT. No therapy.  1 SVT episode @ 130 bpm (38 Sec).  There were 23 Vent sense episodes detected for up to 1.1 min/day (AT). Health trends (patient activity, heart rate variability, average heart rates) are stable.Trans-thoracic  impedance trends and the OptiVol Fluid Index do no present significant abnormalities. Battery longevity is 4.3 years. RA pacing is 3  . Ischemic cardiomyopathy 09/27/2018  . MI (myocardial infarction) ([place]) 2003  . NSTEMI (non-ST elevated myocardial infarction) ([place]) 07/16/2014  . OSA on CPAP   . Oxygen deficiency   . Pneumonia 10/2014  . Proteinuria   . Sleep apnea   . 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 amiodarone (PACERONE) 200 MG tablet Take 200 mg by mouth daily.    01/03/2019 at Unknown time  . aspirin EC 81 MG tablet Take 81 mg by mouth daily.   01/03/2019 at Unknown time  . carvedilol (COREG) 12.5 MG tablet Take 6.25 mg by mouth 2 (two) times daily with a meal.   01/03/2019 at Unknown time  . Cholecalciferol (VITAMIN D) 2000 units tablet Take 2,000 Units by mouth 2 (two) times daily.   01/03/2019 at Unknown time  . empagliflozin (JARDIANCE) 25 MG TABS tablet Take 25 mg by mouth daily.   01/03/2019 at Unknown time  . ezetimibe (ZETIA) 10 MG tablet Take 1 tablet (10 mg total) by mouth daily. (Patient taking differently: Take 10 mg by mouth at bedtime. ) 90 tablet 3 01/02/2019 at Unknown time  . insulin aspart protamine - aspart (NOVOLOG MIX 70/30  FLEXPEN) (70-30) 100 UNIT/ML FlexPen Inject 63 Units into the skin 2 (two) times daily as needed (as needed for blood sugar over 200). as needed for blood sugar over 200   01/03/2019 at Unknown time  . isosorbide mononitrate (IMDUR) 30 MG 24 hr tablet Take 30 mg by mouth daily.   01/03/2019 at Unknown time  . liraglutide (VICTOZA) 18 MG/3ML SOPN Inject 1.8 mg into the skin daily.   01/03/2019 at Unknown time  . metFORMIN (GLUCOPHAGE-XR) 500 MG 24 hr tablet Take 500 mg by mouth 2 (two) times daily.    01/03/2019 at Unknown time  . metolazone (ZAROXOLYN) 2.5 MG tablet Take 2.5 mg by mouth daily as needed (for >2 lb weight gain).   prn at prn  .  Omega-3 Fatty Acids (FISH OIL) 1000 MG CAPS Take 1,000 mg by mouth 2 (two) times daily.    01/03/2019 at Unknown time  . potassium chloride SA (K-DUR,KLOR-CON) 20 MEQ tablet Take 1 tablet (20 mEq total) by mouth 2 (two) times daily. 60 tablet 3 01/03/2019 at Unknown time  . PRESCRIPTION MEDICATION Inhale into the lungs at bedtime. CPAP   01/02/2019 at Unknown time  . rosuvastatin (CRESTOR) 40 MG tablet Take 40 mg by mouth at bedtime.   01/02/2019 at Unknown time  . sacubitril-valsartan (ENTRESTO) 97-103 MG Take 1 tablet by mouth 2 (two) times daily.    01/03/2019 at Unknown time  . sertraline (ZOLOFT) 100 MG tablet Take 50 mg by mouth at bedtime.    01/02/2019 at Unknown time  . ticagrelor (BRILINTA) 90 MG TABS tablet Take 1 tablet (90 mg total) by mouth 2 (two) times daily. 60 tablet 3 01/03/2019 at Unknown time  . torsemide (DEMADEX) 20 MG tablet Take 1 tablet (20 mg total) by mouth 2 (two) times daily. (Patient taking differently: Take 20 mg by mouth 2 (two) times daily as needed (fluid). ) 180 tablet 3 prn at prn   Assessment: Pharmacy asked to initiate and monitor Heparin for ACS/NSTEMI.   Pt not on anticoagulants per med list.  H&H, PLT trending down from baseline. The plan is to follow creatinine this weekend and likely pursue cath on Monday 12/14   Heparin Course: 12/10 initiation: 4000 units bolus, then 1000 units/hr 12/10 1800 HL 0.41: no change 12/11 0000 HL 0.25: rate inc to 1300 units/hr 12/11 1017 HL 0.43: therapeutic x 1 12/11 1712 HL 0.34: therapeutic x 2 12/12 0558 HL 0.16: subtherapeutic, confirmed w/ RN no issues w/ infusion. 12/12 1454 HL 0.35 therapeutic x 1 @ 1500 units/hr 12/12 2246 HL 0.37 therapeutic x 2 @ 1500 units/hr 12/13 0443 HL 0.36 therapeutic x 3 @ 1500 units/hr, CBC stable. 12/14 0535 HL 0.53 therapeutic x 4 @ 1500 units/hr, CBC stable  12/14 0838 Heparin infusion stopped due to procedure: left heart catheter and cors/grafts angiography  Goal of Therapy:   Heparin level 0.3-0.7 units/ml Monitor platelets by anticoagulation protocol: Yes   Plan:   Per cardiologist, will resume heparin infusion 8 hours post-sheath removal(which was removed at approximately 1000 this AM). The infusion has been scheduled to restart@1800  this evening at 1500units/hr(no bolus required).     Will recheck HL approximately 6 hours after restart of infusion. Check CBC with AM labs and at least once every 3 days per protocol.   Pearla Dubonnet, PharmD Clinical Pharmacist 01/08/2019 1:23 PM

## 2019-01-08 NOTE — Interval H&P Note (Signed)
Cath Lab Visit (complete for each Cath Lab visit)  Clinical Evaluation Leading to the Procedure:   ACS: Yes.    Non-ACS:  n/a    History and Physical Interval Note:  01/08/2019 8:56 AM  Martin Bush  has presented today for surgery, with the diagnosis of NSTEMI.  The various methods of treatment have been discussed with the patient and family. After consideration of risks, benefits and other options for treatment, the patient has consented to  Procedure(s): LEFT HEART CATH AND CORS/GRAFTS ANGIOGRAPHY (N/A) as a surgical intervention.  The patient's history has been reviewed, patient examined, no change in status, stable for surgery.  I have reviewed the patient's chart and labs.  Questions were answered to the patient's satisfaction.     Kathlyn [place]

## 2019-01-08 NOTE — TOC Transition Note (Signed)
Transition of Care [facility]) - CM/SW Discharge Note   Patient Details  Name: Martin Bush MRN: [id number] Date of Birth: Aug 17, 1955  [facility] CM/SW Contact:  Ross Ludwig, LCSW Phone Number: 01/08/2019, 1:38 PM   Clinical Narrative:    Patient will be transferring to [facility] for a Cab[facility], [place] signing off.     Barriers to Discharge: Continued Medical Work up   Patient Goals and CMS Choice Patient states their goals for this hospitalization and ongoing recovery are:: "get to breathing" CMS Medicare.gov Compare Post Acute Care list provided to:: Patient Choice offered to / list presented to : Patient  Discharge Placement                       Discharge Plan and Services In-house Referral: Clinical Social Work                                   Social Determinants of Health ([place]) Interventions     Readmission Risk Interventions Readmission Risk Prevention Plan 01/04/2019  Transportation Screening Complete  Home Care Screening Complete  Some recent data might be hidden

## 2019-01-09 DIAGNOSIS — I5043 Acute on chronic combined systolic (congestive) and diastolic (congestive) heart failure: Secondary | ICD-10-CM

## 2019-01-09 DIAGNOSIS — I472 Ventricular tachycardia: Secondary | ICD-10-CM

## 2019-01-09 DIAGNOSIS — I25810 Atherosclerosis of coronary artery bypass graft(s) without angina pectoris: Secondary | ICD-10-CM

## 2019-01-09 DIAGNOSIS — I5042 Chronic combined systolic (congestive) and diastolic (congestive) heart failure: Secondary | ICD-10-CM

## 2019-01-09 DIAGNOSIS — I214 Non-ST elevation (NSTEMI) myocardial infarction: Secondary | ICD-10-CM

## 2019-01-09 DIAGNOSIS — I129 Hypertensive chronic kidney disease with stage 1 through stage 4 chronic kidney disease, or unspecified chronic kidney disease: Secondary | ICD-10-CM

## 2019-01-09 DIAGNOSIS — N1832 Chronic kidney disease, stage 3b: Secondary | ICD-10-CM

## 2019-01-09 DIAGNOSIS — I25110 Atherosclerotic heart disease of native coronary artery with unstable angina pectoris: Secondary | ICD-10-CM

## 2019-01-09 LAB — GLUCOSE, CAPILLARY
Glucose-Capillary: 115 mg/dL — ABNORMAL HIGH (ref 70–99)
Glucose-Capillary: 183 mg/dL — ABNORMAL HIGH (ref 70–99)
Glucose-Capillary: 172 mg/dL — ABNORMAL HIGH (ref 70–99)
Glucose-Capillary: 172 mg/dL — ABNORMAL HIGH (ref 70–99)

## 2019-01-09 LAB — CREATININE, SERUM
Creatinine, Ser: 1.6 mg/dL — ABNORMAL HIGH (ref 0.61–1.24)
GFR calc Af Amer: 52 mL/min — ABNORMAL LOW (ref >60)
GFR calc non Af Amer: 45 mL/min — ABNORMAL LOW (ref >60)

## 2019-01-09 LAB — BASIC METABOLIC PANEL
Anion gap: 9 (ref 5–15)
BUN: 32 mg/dL — ABNORMAL HIGH (ref 8–23)
CO2: 23 mmol/L (ref 22–32)
Calcium: 9.5 mg/dL (ref 8.9–10.3)
Chloride: 104 mmol/L (ref 98–111)
Creatinine, Ser: 1.61 mg/dL — ABNORMAL HIGH (ref 0.61–1.24)
GFR calc Af Amer: 52 mL/min — ABNORMAL LOW (ref >60)
GFR calc non Af Amer: 45 mL/min — ABNORMAL LOW (ref >60)
Glucose, Bld: 125 mg/dL — ABNORMAL HIGH (ref 70–99)
Potassium: 4.7 mmol/L (ref 3.5–5.1)
Sodium: 136 mmol/L (ref 135–145)

## 2019-01-09 MED ORDER — INSULIN ASPART 100 UNIT/ML ~~LOC~~ SOLN
0.0000 [IU] | Freq: Every day | SUBCUTANEOUS | Status: DC
  Administered 2019-01-09: 2 [IU] via SUBCUTANEOUS

## 2019-01-09 MED ORDER — INSULIN ASPART 100 UNIT/ML ~~LOC~~ SOLN
0.0000 [IU] | Freq: Three times a day (TID) | SUBCUTANEOUS | Status: DC
  Administered 2019-01-09 – 2019-01-10 (×3): 3 [IU] via SUBCUTANEOUS
  Administered 2019-01-10: 5 [IU] via SUBCUTANEOUS
  Administered 2019-01-11: 2 [IU] via SUBCUTANEOUS

## 2019-01-09 NOTE — H&P (Signed)
CARDIOLOGY ADMIT NOTE   Patient ID: Martin Bush MRN: [id number] DOB/AGE: [birth year] 63 y.o.  Admit date: 01/08/2019 Primary Physician:  [name], MD  Patient ID: Martin Bush, male    DOB: 10-May-1955, 63 y.o.   MRN: [id number]  No chief complaint on file.  HPI:    Martin Bush  is a 63 y.o. Caucasian male with CAD and history of CABG x2 in 2002 with only LIMA to LAD being patent, Circumflex coronary artery is super dominant. H/O  Left main stenting in 2015 and in 2016 he had complex PCI to in-stent restenotic circumflex/OM bifurcation, due to recurrent restenosis, underwent stenting to left main and circumflex proximal and midsegment with implantation of 2 overlapping 3.5 x 20 and 3.0 x 20 mm  Synergy DES on 08/16/2017 when he presented with decompensated heart failure and recurrent VT.  He is now readmitted with similar complaints of pulmonary edema, respiratory distress, non-STEMI and sustained VT on 08/02/2017 to [facility] needing up titration of amiodarone and underwent coronary angiography on 08/04/2017 revealing severely restenotic left main stent that extends into the circumflex proximal and also old mid circumflex stent.   Due to complex tear of coronary disease, he is now transferred to [facility] for further management and evaluation.  Fortunately since transfer, he has not had any further chest pain or shortness of breath or palpitations, feels well and thinks that he may be able to return home.  His wife is present at the bedside.  Past Medical History:  Diagnosis Date  . AICD (automatic cardioverter/defibrillator) present    Medtronic  . Arthritis    "thumbs"  (08/02/2017)  . CHF (congestive heart failure) ([place])   . Chronic combined systolic and diastolic heart failure ([place])    a. 08/2017 Echo: EF 20-25%, mod glob HK. Sev distal ant sept, inflat HK. Apical AK. Gr2 DD. Mildly reduced RV fxn.  . Colon polyps   . Coronary artery disease     a. s/p CABG x 2 (LIMA->LAD, VG->OM); b. Multiple PCI's to LM/LCX/OM; c. 07/2017 PTCA of LM/LCX w/ early ISR-->repeat PCI/DES to LM (3.5x20 Synergy DES) and LCX (3.0x20 Synergy DES); d. 07/2018 Relook Cath: LM patent stent, LAD 100ost, LCX patent stent, OM1 99/60, LIMA->LAD ok. VG->dLCX 54 (old).  . Depression   . Encounter for assessment of implantable cardioverter-defibrillator (ICD) 09/27/2018  . High cholesterol   . Hypertension   . ICD; Biventricular  Medtronic ICD Amplia MRI QWuad CRT-D  in situ 10/29/14 10/29/2014   Remote ICD check 09.23.20:  One 6 beat NSVT. No therapy.  1 SVT episode @ 130 bpm (38 Sec).  There were 23 Vent sense episodes detected for up to 1.1 min/day (AT). Health trends (patient activity, heart rate variability, average heart rates) are stable.Trans-thoracic impedance trends and the OptiVol Fluid Index do no present significant abnormalities. Battery longevity is 4.3 years. RA pacing is 3  . Ischemic cardiomyopathy 09/27/2018  . MI (myocardial infarction) ([place]) 2003  . NSTEMI (non-ST elevated myocardial infarction) ([place]) 07/16/2014  . OSA on CPAP   . Oxygen deficiency   . Pneumonia 10/2014  . Proteinuria   . Sleep apnea   . Type II diabetes mellitus (HCC)    insulin dependent   Past Surgical History:  Procedure Laterality Date  . BIOPSY  09/19/2018   Procedure: BIOPSY;  Surgeon: Thornton Park, MD;  Location: [facility];  Service: Gastroenterology;;  . CARDIAC CATHETERIZATION N/A 07/16/2014   Procedure: Left Heart Cath and  Coronary Angiography;  Surgeon: Adrian Prows, MD;  Location: [place] [facility];  Service: Cardiovascular;  Laterality: N/A;  . CARDIAC CATHETERIZATION  07/16/2014   Procedure: Coronary Balloon Angioplasty;  Surgeon: Adrian Prows, MD;  Location: [place] [facility];  Service: Cardiovascular;;  . CARDIAC CATHETERIZATION  2003  . CARDIAC DEFIBRILLATOR PLACEMENT  2016  . CATARACT EXTRACTION W/ INTRAOCULAR LENS IMPLANT Left 03/2014  . COLONOSCOPY    .  COLONOSCOPY WITH PROPOFOL N/A 09/19/2018   Procedure: COLONOSCOPY WITH PROPOFOL;  Surgeon: Thornton Park, MD;  Location: [facility];  Service: Gastroenterology;  Laterality: N/A;  . CORONARY ANGIOPLASTY WITH STENT PLACEMENT     "I've got a total of 8 stents in there; mostly doine at [facility]" (08/02/2017)  . CORONARY ARTERY BYPASS GRAFT  ~ 2003   "CABG X2"; [facility]  . CORONARY ATHERECTOMY N/A 08/16/2017   Procedure: CORONARY ATHERECTOMY;  Surgeon: Nigel Mormon, MD;  Location: [place] [facility];  Service: Cardiovascular;  Laterality: N/A;  . CORONARY BALLOON ANGIOPLASTY N/A 08/04/2017   Procedure: CORONARY BALLOON ANGIOPLASTY;  Surgeon: Adrian Prows, MD;  Location: [place] [facility];  Service: Cardiovascular;  Laterality: N/A;  . CORONARY STENT INTERVENTION N/A 08/16/2017   Procedure: CORONARY STENT INTERVENTION;  Surgeon: Nigel Mormon, MD;  Location: [place] [facility];  Service: Cardiovascular;  Laterality: N/A;  . ELBOW SURGERY Left ?2001   "pinched nerve"  . ENDOSCOPIC MUCOSAL RESECTION  12/18/2018   Procedure: ENDOSCOPIC MUCOSAL RESECTION;  Surgeon: Rush Landmark Telford Nab., MD;  Location: [place];  Service: Gastroenterology;;  . Otho Darner SIGMOIDOSCOPY N/A 12/18/2018   Procedure: Beryle Quant;  Surgeon: Irving Copas., MD;  Location: [place];  Service: Gastroenterology;  Laterality: N/A;  . HEMOSTASIS CLIP PLACEMENT  12/18/2018   Procedure: HEMOSTASIS CLIP PLACEMENT;  Surgeon: Irving Copas., MD;  Location: [place];  Service: Gastroenterology;;  . LEFT HEART CATH AND CORS/GRAFTS ANGIOGRAPHY N/A 08/04/2017   Procedure: LEFT HEART CATH AND CORS/GRAFTS ANGIOGRAPHY;  Surgeon: Adrian Prows, MD;  Location: [place] [facility];  Service: Cardiovascular;  Laterality: N/A;  . LEFT HEART CATH AND CORS/GRAFTS ANGIOGRAPHY N/A 08/20/2017   Procedure: LEFT HEART CATH AND CORS/GRAFTS ANGIOGRAPHY;  Surgeon: Nigel Mormon, MD;   Location: [place] [facility];  Service: Cardiovascular;  Laterality: N/A;  . LEFT HEART CATH AND CORS/GRAFTS ANGIOGRAPHY N/A 01/08/2019   Procedure: LEFT HEART CATH AND CORS/GRAFTS ANGIOGRAPHY;  Surgeon: Wellington Hampshire, MD;  Location: [place] [facility];  Service: Cardiovascular;  Laterality: N/A;  . POLYPECTOMY  09/19/2018   Procedure: POLYPECTOMY;  Surgeon: Thornton Park, MD;  [facility];  Service: Gastroenterology;;  . Lia Foyer INJECTION  09/19/2018   Procedure: SUBMUCOSAL INJECTION;  Surgeon: Thornton Park, MD;  Location: [facility];  Service: Gastroenterology;;  . Lia Foyer LIFTING INJECTION  12/18/2018   Procedure: SUBMUCOSAL LIFTING INJECTION;  Surgeon: Irving Copas., MD;  Location: [facility] ENDOSCOPY;  Service: Gastroenterology;;   Social History   Socioeconomic History  . Marital status: Married    Spouse name: Not on file  . Number of children: Not on file  . Years of education: Not on file  . Highest education level: Not on file  Occupational History  . Not on file  Tobacco Use  . Smoking status: Former Smoker    Years: 27.00    Types: Cigars    Quit date: 2002    Years since quitting: 18.9  . Smokeless tobacco: Former Systems developer    Quit date: 2002  Substance and Sexual Activity  . Alcohol use: Not Currently  . Drug use: Never  . Sexual activity: Not Currently  Other Topics Concern  . Not on file  Social History Narrative  . Not on file   Social Determinants of Health   Financial Resource Strain:   . Difficulty of Paying Living Expenses: Not on file  Food Insecurity:   . Worried About Charity fundraiser in the Last Year: Not on file  . Ran Out of Food in the Last Year: Not on file  Transportation Needs:   . Lack of Transportation (Medical): Not on file  . Lack of Transportation (Non-Medical): Not on file  Physical Activity:   . Days of Exercise per Week: Not on file  . Minutes of Exercise per Session: Not on file  Stress:   .  Feeling of Stress : Not on file  Social Connections:   . Frequency of Communication with Friends and Family: Not on file  . Frequency of Social Gatherings with Friends and Family: Not on file  . Attends Religious Services: Not on file  . Active Member of Clubs or Organizations: Not on file  . Attends Archivist Meetings: Not on file  . Marital Status: Not on file  Intimate Partner Violence:   . Fear of Current or Ex-Partner: Not on file  . Emotionally Abused: Not on file  . Physically Abused: Not on file  . Sexually Abused: Not on file   ROS  Review of Systems  Constitution: Positive for weight gain. Negative for chills, decreased appetite and malaise/fatigue.  Cardiovascular: Positive for dyspnea on exertion. Negative for leg swelling and syncope.  Endocrine: Negative for cold intolerance.  Hematologic/Lymphatic: Does not bruise/bleed easily.  Musculoskeletal: Negative for joint swelling.  Gastrointestinal: Negative for abdominal pain, anorexia, change in bowel habit, hematochezia and melena.  Neurological: Negative for headaches and light-headedness.  Psychiatric/Behavioral: Negative for depression and substance abuse.  All other systems reviewed and are negative.  Objective   Vitals with BMI 01/09/2019 01/09/2019 01/09/2019  Height - - -  Weight - - 211 lbs 14 oz  BMI - - [id number]  Systolic [id number]  Diastolic 81 66 61  Pulse 64 63 61      Physical Exam  Constitutional: He is oriented to person, place, and time. Vital signs are normal. He does not appear ill.  He is moderately built and mildly obese in no acute distress.  HENT:  Head: Normocephalic and atraumatic.  Cardiovascular: Normal rate, regular rhythm, normal heart sounds and intact distal pulses.  Pulses:      Femoral pulses are 2+ on the right side and 2+ on the left side.      Popliteal pulses are 1+ on the right side and 1+ on the left side.       Dorsalis pedis pulses are 0 on the right side and  0 on the left side.       Posterior tibial pulses are 0 on the right side and 0 on the left side.  No leg edema, no JVD.   Pulmonary/Chest: Effort normal and breath sounds normal. No accessory muscle usage. No respiratory distress.  Abdominal: Soft. Bowel sounds are normal. A hernia is present. Hernia confirmed positive in the ventral area (reducible).  Musculoskeletal:        General: Normal range of motion.     Cervical back: Normal range of motion and neck supple.  Neurological: He is alert and oriented  to person, place, and time.  Skin: Skin is warm and dry.  Vitals reviewed.  Laboratory examination:   Recent Labs    01/06/19 0558 01/07/19 0443 01/08/19 2314 01/09/19 0546  NA 132* 133*  --  136  K 4.2 4.5  --  4.7  CL 98 101  --  104  CO2 24 23  --  23  GLUCOSE 266* 164*  --  125*  BUN 48* 45*  --  32*  CREATININE 1.85* 1.50* 1.60* 1.61*  CALCIUM 8.7* 9.0  --  9.5  GFRNONAA 38* 49* 45* 45*  GFRAA 44* 57* 52* 52*   estimated creatinine clearance is 51.9 mL/min (A) (by C-G formula based on SCr of 1.61 mg/dL (H)).  CMP Latest Ref Rng & Units 01/09/2019 01/08/2019 01/07/2019  Glucose 70 - 99 mg/dL 125(H) - 164(H)  BUN 8 - 23 mg/dL 32(H) - 45(H)  Creatinine 0.61 - 1.24 mg/dL 1.61(H) 1.60(H) 1.50(H)  Sodium 135 - 145 mmol/L 136 - 133(L)  Potassium 3.5 - 5.1 mmol/L 4.7 - 4.5  Chloride 98 - 111 mmol/L 104 - 101  CO2 22 - 32 mmol/L 23 - 23  Calcium 8.9 - 10.3 mg/dL 9.5 - 9.0  Total Protein 6.5 - 8.1 g/dL - - -  Total Bilirubin 0.3 - 1.2 mg/dL - - -  Alkaline Phos 38 - 126 U/L - - -  AST 15 - 41 U/L - - -  ALT 0 - 44 U/L - - -   CBC Latest Ref Rng & Units 01/08/2019 01/08/2019 01/07/2019  WBC 4.0 - 10.5 K/uL 7.5 8.0 7.5  Hemoglobin 13.0 - 17.0 g/dL 12.6(L) 12.6(L) 12.1(L)  Hematocrit 39.0 - 52.0 % 37.9(L) 37.4(L) 35.0(L)  Platelets 150 - 400 K/uL 198 200 189   Lipid Panel     Component Value Date/Time   CHOL 142 01/04/2019 0640   TRIG 148 01/04/2019 0640   HDL 45  01/04/2019 0640   CHOLHDL 3.2 01/04/2019 0640   VLDL 30 01/04/2019 0640   LDLCALC 67 01/04/2019 0640   LDLDIRECT 71.0 10/30/2018 1030   HEMOGLOBIN A1C Lab Results  Component Value Date   HGBA1C 11.6 (H) 12/27/2018   MPG 263 07/12/2015   TSH No results for input(s): TSH in the last 8760 hours. BNP (last 3 results) Recent Labs    01/03/19 2133  BNP 1,262.0*    Medications and allergies   Allergies  Allergen Reactions  . Diltiazem Rash     Prior to Admission medications   Medication Sig Start Date End Date Taking? Authorizing Provider  acetaminophen (TYLENOL) 325 MG tablet Take 2 tablets (650 mg total) by mouth every 4 (four) hours as needed for headache or mild pain. 01/08/19  Yes Nolberto Hanlon, MD  amiodarone (PACERONE) 400 MG tablet Take 1 tablet (400 mg total) by mouth 2 (two) times daily. 01/08/19  Yes Nolberto Hanlon, MD  aspirin EC 81 MG EC tablet Take 1 tablet (81 mg total) by mouth daily. 01/08/19  Yes Nolberto Hanlon, MD  carvedilol (COREG) 6.25 MG tablet Take 1 tablet (6.25 mg total) by mouth 2 (two) times daily with a meal. 01/08/19  Yes Nolberto Hanlon, MD  cholecalciferol (VITAMIN D) 25 MCG tablet Take 2 tablets (2,000 Units total) by mouth 2 (two) times daily. 01/08/19  Yes Nolberto Hanlon, MD  ezetimibe (ZETIA) 10 MG tablet Take 1 tablet (10 mg total) by mouth at bedtime. 01/08/19  Yes Nolberto Hanlon, MD  insulin aspart protamine- aspart (NOVOLOG MIX 70/30) (70-30)  100 UNIT/ML injection Inject 0.25 mLs (25 Units total) into the skin 2 (two) times daily with a meal. Patient taking differently: Inject 63 Units into the skin 2 (two) times daily with a meal.  01/08/19  Yes Nolberto Hanlon, MD  isosorbide mononitrate (IMDUR) 30 MG 24 hr tablet Take 1 tablet (30 mg total) by mouth daily. 01/08/19  Yes Nolberto Hanlon, MD  liraglutide (VICTOZA) 18 MG/3ML SOPN Inject 1.8 mg into the skin daily.   Yes [provider]  nitroGLYCERIN (NITROGLYN) 2 % ointment Apply 1 inch topically every  6 (six) hours. 01/08/19  Yes Nolberto Hanlon, MD  nitroGLYCERIN (NITROSTAT) 0.4 MG SL tablet Place 1 tablet (0.4 mg total) under the tongue every 5 (five) minutes x 3 doses as needed for chest pain. 01/08/19  Yes Nolberto Hanlon, MD  Omega-3 Fatty Acids (FISH OIL) 1000 MG CAPS Take 1,000 mg by mouth 2 (two) times daily.    Yes [provider]  potassium chloride SA (KLOR-CON) 20 MEQ tablet Take 1 tablet (20 mEq total) by mouth 2 (two) times daily. 01/08/19  Yes Nolberto Hanlon, MD  PRESCRIPTION MEDICATION Inhale into the lungs at bedtime. CPAP   Yes [provider]  rosuvastatin (CRESTOR) 40 MG tablet Take 1 tablet (40 mg total) by mouth at bedtime. 01/08/19  Yes Nolberto Hanlon, MD  sertraline (ZOLOFT) 50 MG tablet Take 1 tablet (50 mg total) by mouth at bedtime. 01/08/19  Yes Nolberto Hanlon, MD  ticagrelor (BRILINTA) 90 MG TABS tablet Take 1 tablet (90 mg total) by mouth 2 (two) times daily. 01/08/19  Yes Nolberto Hanlon, MD    . sodium chloride      Current Outpatient Medications  Medication Instructions  . acetaminophen (TYLENOL) 650 mg, Oral, Every 4 hours PRN  . amiodarone (PACERONE) 400 mg, Oral, 2 times daily  . aspirin 81 mg, Oral, Daily  . carvedilol (COREG) 6.25 mg, Oral, 2 times daily with meals  . ezetimibe (ZETIA) 10 mg, Oral, Daily at bedtime  . Fish Oil 1,000 mg, Oral, 2 times daily  . insulin aspart protamine- aspart (NOVOLOG MIX 70/30) (70-30) 100 UNIT/ML injection 25 Units, Subcutaneous, 2 times daily with meals  . isosorbide mononitrate (IMDUR) 30 mg, Oral, Daily  . liraglutide (VICTOZA) 1.8 mg, Subcutaneous, Daily  . nitroGLYCERIN (NITROGLYN) 2 % ointment 1 inch, Topical, Every 6 hours  . nitroGLYCERIN (NITROSTAT) 0.4 mg, Sublingual, Every 5 min x3 PRN  . potassium chloride SA (KLOR-CON) 20 MEQ tablet 20 mEq, Oral, 2 times daily  . PRESCRIPTION MEDICATION Inhalation, Daily at bedtime, CPAP  . rosuvastatin (CRESTOR) 40 mg, Oral, Daily at bedtime  . sertraline  (ZOLOFT) 50 mg, Oral, Daily at bedtime  . ticagrelor (BRILINTA) 90 mg, Oral, 2 times daily  . Vitamin D3 (VITAMIN D) 2,000 Units, Oral, 2 times daily    I/O last 3 completed shifts: In: 243 [P.O.:240; I.V.:3] Out: 700 [Urine:700] Total I/O In: 640 [P.O.:640] Out: 500 [Urine:500]   Radiology:  CARDIAC CATHETERIZATION  Result Date: 01/08/2019  Ost LAD to Mid LAD lesion is 100% stenosed.  LIMA and is normal in caliber.  The graft exhibits no disease.  Mid LAD lesion is 50% stenosed.  Ost Cx to Prox Cx lesion is 99% stenosed.  Ost LM to Dist LM lesion is 99% stenosed.  3rd Mrg lesion is 60% stenosed.  LPAV lesion is 90% stenosed.  1.  Severe underlying three-vessel coronary artery disease with patent LIMA to LAD.  Chronically occluded SVG to OM.  Severe in-stent restenosis in left main stent extending into the proximal left circumflex.  In addition, there is significant restenosis in the stent placed in the left posterior AV groove artery.  The RCA is known to be small in size and severely diseased. 2.  Left ventricular angiography was not performed due to chronic kidney disease.  LVEDP was 13 mmHg. Recommendations: This is a difficult management situation overall.  It appears that the patient has very aggressive restenosis in both stents that were placed last year.  Although repeat balloon angioplasty is technically possible, the long-term results might not be durable.  Due to that, I think it is worth obtaining surgical consultation for redo CABG.  I discussed the case with Dr. Einar Gip.  We will transfer the patient to [facility] under Dr. Einar Gip service and we will consult CVTS for consult.  The patient will be continued on ticagrelor at the present time until revascularization decision is made.  Resume heparin 8 hours after sheath pull.   Cardiac Studies:   Renal artery duplex  09/02/2017: Right: Abnormal right Resistive Index. No evidence of right renal  artery stenosis. Left:   Normal left Resistive Index. No evidence of left renal artery stenosis. Mesenteric: Normal Celiac artery findings. 70 to 99% stenosis in the superior mesenteric artery.  Echocardiogram 01/04/2019:  1. Left ventricular ejection fraction, by visual estimation, is 25 to 30%. The left ventricle has severely decreased function. There is no left ventricular hypertrophy. Definity contrast agent was given IV to delineate the left ventricular endocardial borders.  Left ventricular diastolic parameters are consistent with Grade II diastolic dysfunction (pseudonormalization).  Mildly dilated left ventricular internal cavity size. The left ventricle demonstrates global hypokinesis. 2. RV not well visualized.  3. Left atrial size was mildly dilated.  Left Heart Catheterization 01/08/19:  1.  Severe underlying three-vessel coronary artery disease with patent LIMA to LAD.  Chronically occluded SVG to OM.  Severe in-stent restenosis in left main stent extending into the proximal left circumflex.  In addition, there is significant restenosis in the stent placed in the left posterior AV groove artery.  The RCA is known to be small in size and severely diseased. 2.  Left ventricular angiography was not performed due to chronic kidney disease.  LVEDP was 13 mmHg.  Assessment   Martin Bush  is a 63 y.o. Caucasian male with CAD and history of CABG x2 in 2002 with only LIMA to LAD being patent, Circumflex coronary artery is super dominant. H/O  Left main stenting in 2015 and in 2016 he had complex PCI to in-stent restenotic circumflex/OM bifurcation, due to recurrent restenosis, underwent stenting to left main and circumflex proximal and midsegment with implantation of 2 overlapping 3.5 x 20 and 3.0 x 20 mm  Synergy DES on 08/16/2017 when he presented with decompensated heart failure and recurrent VT.  Similar presentation on 01/03/2019, coronary angiography on 08/04/2017 revealing severely restenotic left main stent that  extends into the circumflex proximal and also old mid circumflex stent.   1. Non-STEMI related to superdominant circumflex and left main stent restenosis. 2. Acute renal failure stage III, hemoglobin fortunately has improved and stable. 3. Acute on chronic systolic and diastolic heart failure 4. Diabetes mellitus type II uncontrolled with hyperglycemia. 5. Obstructive sleep apnea on CPAP Hypertension controlled with stage III chronic kidney disease 6. Mixed hyperlipidemia 7. Patient enrolled in clinical trials for anemia in CHF  With Injectafer 8. VT H/O ICD; Biventricular  Medtronic ICD Amplia MRI  QWuad CRT-D  in situ 10/29/14. No further VT on telemetry last 24 hours since admission.   EKG 01/09/2019: AV paced rhythm, biventricular pacemaker detected.  No further analysis.  Ventricular rate 60 bpm.  Recommendations:   Patient presenting with pulmonary edema, non-STEMI similar presentation on 08/04/2017 and now again on 01/03/2019, unfortunately showing very aggressive nature of restenosis to all previously placed stents including stents placed in 2015 and 2016.  Patient has signed with stent in the left main and proximal circumflex.  Has mid circumflex in-stent restenosis ias  well.  I am not very certain that PCI would be optimal without laser atherectomy in view of severe amount of new intimal hyperplasia.  I have discussed the findings with Dr. Melodie Bouillon for consideration for single vessel CABG.  Otherwise I may consider scoring balloon angioplasty or CSI atherectomy followed by pulmonary but not restenting and consider transfer out to a [place], [place].  Previously was the past 6 months or so ago, he had made significant lifestyle changes and had lost weight and had diabetes and control but now diabetes is also uncontrolled and he has gained significant amount of weight as well  Fortunately lipids are well controlled, blood pressure is stable and normal.  He is on  appropriate medical therapy.  With regard to sustained VT, it is ischemic in etiology.  Overall extremely guarded long-term prognosis with high risk for cardiac mortality.  We'll await surgical evaluation prior to making decision.  I have discussed this extensively with the patient and his wife today.  Adrian Prows, MD, [facility] 01/09/2019, 2:38 PM [facility] [place] Pager: [phone] Office: [phone]

## 2019-01-09 NOTE — Consult Note (Signed)
[name]       [name] [address]             [phone]        Martin Bush [facility] Medical Record [id number] Date of Birth: [birth year]  Referring: No ref. provider found Primary Care: [name], MD Primary Cardiologist:No primary care provider on file.  Chief Complaint:   No chief complaint on file.   History of Present Illness:     63 yo male transferred from [facility] for evaluation of redo-CABG vs high risk PCI.  He is followed by Dr. Einar Gip in [place].  In the summer 2019, he underwent repeat catheterization and PCI in the setting of left main and circumflex disease.  He was initially treated with PTCA but subsequently required DES to in-stent restenosis.  In late July, he underwent a third catheterization due to ongoing symptoms.  After this intervention, he was reportedly doing well with last echo 08/2017 showing EF 20 to 25%.  He was not particularly active but able to carry out routine ADLs and household chores without significant symptoms or limitations.  He was eating out 2 meals a day and not particularly careful with his sodium intake.  He reportedly did not experience much lower extremity edema or increase in abdominal girth.     Past Medical and Surgical History: Previous Chest Surgery: yes, CABG in 2002 Previous Chest Radiation: no Diabetes Mellitus: yes.   Creatinine: 1.61  Past Medical History:  Diagnosis Date  . AICD (automatic cardioverter/defibrillator) present    Medtronic  . Arthritis    "thumbs"  (08/02/2017)  . CHF (congestive heart failure) ([place])   . Chronic combined systolic and diastolic heart failure ([place])    a. 08/2017 Echo: EF 20-25%, mod glob HK. Sev distal ant sept, inflat HK. Apical AK. Gr2 DD. Mildly reduced RV fxn.  . Colon polyps   . Coronary artery disease    a. s/p CABG x 2 (LIMA->LAD, VG->OM); b. Multiple PCI's to LM/LCX/OM; c. 07/2017 PTCA of LM/LCX w/ early ISR-->repeat PCI/DES to LM (3.5x20 Synergy  DES) and LCX (3.0x20 Synergy DES); d. 07/2018 Relook Cath: LM patent stent, LAD 100ost, LCX patent stent, OM1 99/60, LIMA->LAD ok. VG->dLCX 43 (old).  . Depression   . Encounter for assessment of implantable cardioverter-defibrillator (ICD) 09/27/2018  . High cholesterol   . Hypertension   . ICD; Biventricular  Medtronic ICD Amplia MRI QWuad CRT-D  in situ 10/29/14 10/29/2014   Remote ICD check 09.23.20:  One 6 beat NSVT. No therapy.  1 SVT episode @ 130 bpm (38 Sec).  There were 23 Vent sense episodes detected for up to 1.1 min/day (AT). Health trends (patient activity, heart rate variability, average heart rates) are stable.Trans-thoracic impedance trends and the OptiVol Fluid Index do no present significant abnormalities. Battery longevity is 4.3 years. RA pacing is 3  . Ischemic cardiomyopathy 09/27/2018  . MI (myocardial infarction) ([place]) 2003  . NSTEMI (non-ST elevated myocardial infarction) ([place]) 07/16/2014  . OSA on CPAP   . Oxygen deficiency   . Pneumonia 10/2014  . Proteinuria   . Sleep apnea   . Type II diabetes mellitus (H[facility])    insulin dependent    Past Surgical History:  Procedure Laterality Date  . BIOPSY  09/19/2018   Procedure: BIOPSY;  Surgeon: Thornton Park, MD;  Location: [facility];  Service: Gastroenterology;;  . CARDIAC CATHETERIZATION N/A 07/16/2014   Procedure: Left Heart Cath and Coronary Angiography;  Surgeon: Adrian Prows, MD;  Location: [place] [facility];  Service: Cardiovascular;  Laterality: N/A;  . CARDIAC CATHETERIZATION  07/16/2014   Procedure: Coronary Balloon Angioplasty;  Surgeon: Adrian Prows, MD;  Location: [place] [facility];  Service: Cardiovascular;;  . CARDIAC CATHETERIZATION  2003  . CARDIAC DEFIBRILLATOR PLACEMENT  2016  . CATARACT EXTRACTION W/ INTRAOCULAR LENS IMPLANT Left 03/2014  . COLONOSCOPY    . COLONOSCOPY WITH PROPOFOL N/A 09/19/2018   Procedure: COLONOSCOPY WITH PROPOFOL;  Surgeon: Thornton Park, MD;  Location: [facility];  Service:  Gastroenterology;  Laterality: N/A;  . CORONARY ANGIOPLASTY WITH STENT PLACEMENT     "I've got a total of 8 stents in there; mostly doine at [facility]" (08/02/2017)  . CORONARY ARTERY BYPASS GRAFT  ~ 2003   "CABG X2"; [facility]  . CORONARY ATHERECTOMY N/A 08/16/2017   Procedure: CORONARY ATHERECTOMY;  Surgeon: Nigel Mormon, MD;  Location: [place] [facility];  Service: Cardiovascular;  Laterality: N/A;  . CORONARY BALLOON ANGIOPLASTY N/A 08/04/2017   Procedure: CORONARY BALLOON ANGIOPLASTY;  Surgeon: Adrian Prows, MD;  Location: [place] [facility];  Service: Cardiovascular;  Laterality: N/A;  . CORONARY STENT INTERVENTION N/A 08/16/2017   Procedure: CORONARY STENT INTERVENTION;  Surgeon: Nigel Mormon, MD;  Location: [place] [facility];  Service: Cardiovascular;  Laterality: N/A;  . ELBOW SURGERY Left ?2001   "pinched nerve"  . ENDOSCOPIC MUCOSAL RESECTION  12/18/2018   Procedure: ENDOSCOPIC MUCOSAL RESECTION;  Surgeon: Rush Landmark Telford Nab., MD;  Location: [place];  Service: Gastroenterology;;  . Otho Darner SIGMOIDOSCOPY N/A 12/18/2018   Procedure: Beryle Quant;  Surgeon: Irving Copas., MD;  Location: [place];  Service: Gastroenterology;  Laterality: N/A;  . HEMOSTASIS CLIP PLACEMENT  12/18/2018   Procedure: HEMOSTASIS CLIP PLACEMENT;  Surgeon: Irving Copas., MD;  Location: [place];  Service: Gastroenterology;;  . LEFT HEART CATH AND CORS/GRAFTS ANGIOGRAPHY N/A 08/04/2017   Procedure: LEFT HEART CATH AND CORS/GRAFTS ANGIOGRAPHY;  Surgeon: Adrian Prows, MD;  Location: [place] [facility];  Service: Cardiovascular;  Laterality: N/A;  . LEFT HEART CATH AND CORS/GRAFTS ANGIOGRAPHY N/A 08/20/2017   Procedure: LEFT HEART CATH AND CORS/GRAFTS ANGIOGRAPHY;  Surgeon: Nigel Mormon, MD;  Location: [place] [facility];  Service: Cardiovascular;  Laterality: N/A;  . LEFT HEART CATH AND CORS/GRAFTS ANGIOGRAPHY N/A 01/08/2019   Procedure: LEFT  HEART CATH AND CORS/GRAFTS ANGIOGRAPHY;  Surgeon: Wellington Hampshire, MD;  Location: [place] [facility];  Service: Cardiovascular;  Laterality: N/A;  . POLYPECTOMY  09/19/2018   Procedure: POLYPECTOMY;  Surgeon: Thornton Park, MD;  [facility];  Service: Gastroenterology;;  . Lia Foyer INJECTION  09/19/2018   Procedure: SUBMUCOSAL INJECTION;  Surgeon: Thornton Park, MD;  [facility];  Service: Gastroenterology;;  . Lia Foyer LIFTING INJECTION  12/18/2018   Procedure: SUBMUCOSAL LIFTING INJECTION;  Surgeon: Irving Copas., MD;  Location: [facility];  Service: Gastroenterology;;      Social History   Tobacco Use  Smoking Status Former Smoker  . Years: 27.00  . Types: Cigars  . Quit date: 2002  . Years since quitting: 18.9  Smokeless Tobacco Former Systems developer  . Quit date: 2002    Social History   Substance and Sexual Activity  Alcohol Use Not Currently     Allergies  Allergen Reactions  . Diltiazem Rash    Current Facility-Administered Medications  Medication Dose Route Frequency Provider Last Rate Last Admin  . 0.9 %  sodium chloride infusion  250 mL  Intravenous PRN Marrianne Mood D, PA-C      . acetaminophen (TYLENOL) tablet 650 mg  650 mg Oral Q4H PRN Marrianne Mood D, PA-C      . amiodarone (PACERONE) tablet 400 mg  400 mg Oral BID Marrianne Mood D, PA-C   400 mg at 01/09/19 [id number]  . aspirin EC tablet 81 mg  81 mg Oral Daily Marrianne Mood D, PA-C   81 mg at 01/09/19 [id number]  . carvedilol (COREG) tablet 6.25 mg  6.25 mg Oral BID WC Visser, Jacquelyn D, PA-C   6.25 mg at 01/09/19 0842  . cholecalciferol (VITAMIN D3) tablet 2,000 Units  2,000 Units Oral BID Adrian Prows, MD   2,000 Units at 01/09/19 [phone]  . ezetimibe (ZETIA) tablet 10 mg  10 mg Oral Daily Marrianne Mood D, PA-C   10 mg at 01/09/19 0843  . heparin injection 5,000 Units  5,000 Units Subcutaneous Q8H Marrianne Mood D, PA-C   5,000 Units at 01/09/19 [id number]  .  insulin aspart (novoLOG) injection 0-15 Units  0-15 Units Subcutaneous TID WC Adrian Prows, MD      . insulin aspart (novoLOG) injection 0-5 Units  0-5 Units Subcutaneous QHS Adrian Prows, MD   2 Units at 01/09/19 0115  . insulin aspart protamine- aspart (NOVOLOG MIX 70/30) injection 25 Units  25 Units Subcutaneous BID WC Adrian Prows, MD   25 Units at 01/09/19 0739  . isosorbide mononitrate (IMDUR) 24 hr tablet 30 mg  30 mg Oral Daily Marrianne Mood D, PA-C   30 mg at 01/09/19 0844  . liraglutide (VICTOZA) SOPN 1.8 mg  1.8 mg Subcutaneous Daily Adrian Prows, MD      . nitroGLYCERIN (NITROGLYN) 2 % ointment 1 inch  1 inch Topical Q6H Marrianne Mood D, PA-C   1 inch at 01/09/19 [phone]  . nitroGLYCERIN (NITROSTAT) SL tablet 0.4 mg  0.4 mg Sublingual Q5 Min x 3 PRN Adrian Prows, MD      . omega-3 acid ethyl esters (LOVAZA) capsule 2 g  2 g Oral BID Adrian Prows, MD   2 g at 01/09/19 0843  . ondansetron (ZOFRAN) injection 4 mg  4 mg Intravenous Q6H PRN Mickle Plumb, Jacquelyn D, PA-C      . potassium chloride SA (KLOR-CON) CR tablet 20 mEq  20 mEq Oral BID Marrianne Mood D, PA-C   20 mEq at 01/09/19 0844  . rosuvastatin (CRESTOR) tablet 40 mg  40 mg Oral q1800 Marrianne Mood D, PA-C      . sertraline (ZOLOFT) tablet 50 mg  50 mg Oral QHS Visser, Jacquelyn D, PA-C   50 mg at 01/08/19 2350  . sodium chloride flush (NS) 0.9 % injection 3 mL  3 mL Intravenous Q12H Visser, Jacquelyn D, PA-C   3 mL at 01/09/19 0006  . sodium chloride flush (NS) 0.9 % injection 3 mL  3 mL Intravenous PRN Mickle Plumb, Jacquelyn D, PA-C      . ticagrelor [facility]) tablet 90 mg  90 mg Oral BID Arvil Chaco, PA-C   90 mg at 01/09/19 [id number]    Medications Prior to Admission  Medication Sig Dispense Refill Last Dose  . acetaminophen (TYLENOL) 325 MG tablet Take 2 tablets (650 mg total) by mouth every 4 (four) hours as needed for headache or mild pain.   Past Month at Unknown time  . amiodarone (PACERONE) 400 MG tablet Take 1 tablet (400  mg total) by mouth 2 (two) times daily.   01/03/2019  . aspirin EC 81  MG EC tablet Take 1 tablet (81 mg total) by mouth daily.   01/03/2019  . carvedilol (COREG) 6.25 MG tablet Take 1 tablet (6.25 mg total) by mouth 2 (two) times daily with a meal.   01/03/2019 at 2100  . cholecalciferol (VITAMIN D) 25 MCG tablet Take 2 tablets (2,000 Units total) by mouth 2 (two) times daily.   01/03/2019  . ezetimibe (ZETIA) 10 MG tablet Take 1 tablet (10 mg total) by mouth at bedtime.   01/03/2019  . insulin aspart protamine- aspart (NOVOLOG MIX 70/30) (70-30) 100 UNIT/ML injection Inject 0.25 mLs (25 Units total) into the skin 2 (two) times daily with a meal. (Patient taking differently: Inject 63 Units into the skin 2 (two) times daily with a meal. ) 10 mL 11 01/03/2019  . isosorbide mononitrate (IMDUR) 30 MG 24 hr tablet Take 1 tablet (30 mg total) by mouth daily.   01/03/2019  . liraglutide (VICTOZA) 18 MG/3ML SOPN Inject 1.8 mg into the skin daily.   01/03/2019  . nitroGLYCERIN (NITROGLYN) 2 % ointment Apply 1 inch topically every 6 (six) hours. 30 g 0 unk  . nitroGLYCERIN (NITROSTAT) 0.4 MG SL tablet Place 1 tablet (0.4 mg total) under the tongue every 5 (five) minutes x 3 doses as needed for chest pain.  12 unk  . Omega-3 Fatty Acids (FISH OIL) 1000 MG CAPS Take 1,000 mg by mouth 2 (two) times daily.    01/03/2019  . potassium chloride SA (KLOR-CON) 20 MEQ tablet Take 1 tablet (20 mEq total) by mouth 2 (two) times daily.   01/03/2019  . PRESCRIPTION MEDICATION Inhale into the lungs at bedtime. CPAP   01/03/2019  . rosuvastatin (CRESTOR) 40 MG tablet Take 1 tablet (40 mg total) by mouth at bedtime.   01/03/2019  . sertraline (ZOLOFT) 50 MG tablet Take 1 tablet (50 mg total) by mouth at bedtime.   01/03/2019  . ticagrelor (BRILINTA) 90 MG TABS tablet Take 1 tablet (90 mg total) by mouth 2 (two) times daily. 60 tablet  01/03/2019 at 2100    Family History  Problem Relation Age of Onset  . Uterine cancer Mother   . Lung  cancer Mother   . Hyperlipidemia Father   . Heart disease Father   . Hypertension Father   . Diabetes Father   . Colon cancer Neg Hx   . Esophageal cancer Neg Hx   . Colon polyps Neg Hx   . Rectal cancer Neg Hx   . Stomach cancer Neg Hx   . Inflammatory bowel disease Neg Hx   . Liver disease Neg Hx   . Pancreatic cancer Neg Hx      Review of Systems:   Review of Systems  Constitutional: Positive for malaise/fatigue.  Respiratory: Positive for cough and shortness of breath.   Cardiovascular: Positive for orthopnea. Negative for chest pain and leg swelling.  Gastrointestinal: Negative.       Physical Exam: BP 135/81 (BP Location: Left Arm)   Pulse 64   Temp 98 F (36.7 C) (Oral)   Resp 18   Ht 5\' 7"  (1.702 m)   Wt 96.1 kg Comment: scale b  SpO2 99%   BMI 33.19 kg/m  Physical Exam  Constitutional: He is oriented to person, place, and time and well-developed, well-nourished, and in no distress. No distress.  HENT:  Head: Normocephalic.  Eyes: Conjunctivae are normal. No scleral icterus.  Neck: No tracheal deviation present.  Cardiovascular: Normal rate.  Pulmonary/Chest: Effort normal. No respiratory  distress.  Abdominal: He exhibits no distension.  Neurological: He is alert and oriented to person, place, and time.  Skin: Skin is warm and dry. He is not diaphoretic.       I have independently reviewed the above radiologic studies and discussed with the patient   Recent Lab Findings: Lab Results  Component Value Date   WBC 7.5 01/08/2019   HGB 12.6 (L) 01/08/2019   HCT 37.9 (L) 01/08/2019   PLT 198 01/08/2019   GLUCOSE 125 (H) 01/09/2019   CHOL 142 01/04/2019   TRIG 148 01/04/2019   HDL 45 01/04/2019   LDLDIRECT 71.0 10/30/2018   LDLCALC 67 01/04/2019   ALT 15 01/03/2019   AST 21 01/03/2019   NA 136 01/09/2019   K 4.7 01/09/2019   CL 104 01/09/2019   CREATININE 1.61 (H) 01/09/2019   BUN 32 (H) 01/09/2019   CO2 23 01/09/2019   TSH 2.30 09/19/2017    INR 1.0 01/03/2019   HGBA1C 11.6 (H) 12/27/2018    Assessment / Plan:   63 yo male s/p CABG in 2002 with thrombosed vein graft to the circ, and patent LIMA LAD, now with ISR to circumflex stent placed last year, and LM disease.  Additionally he has an EF of 20-25%, and has a CRT-D device in place.  He presently mostly with heart failure symptoms.  On review of the LHC, the ISR is in the circ proper which would be very challenging to revascularized.  The OM would be the only possible target, but this would not address the ISR.  Based on his STS calculations, his operative risk is 28%, which is quite his given that he will only get 1 bypass.  His case will be discussed with our heart failure team to determine the best course of action.  Risk of Mortality: 28.066% Renal Failure: 17.416% Morbidity or Mortality: 55.372% Short Length of Stay: 12.029% Long Length of Stay: 32.557%      I  spent 30 minutes counseling the patient face to face.   Martin Bush 01/09/2019 12:00 PM

## 2019-01-09 NOTE — Plan of Care (Signed)
  Problem: Education: Goal: Knowledge of General Education information will improve Description: Including pain rating scale, medication(s)/side effects and non-pharmacologic comfort measures Outcome: Progressing   Problem: Coping: Goal: Level of anxiety will decrease Outcome: Progressing   Problem: Safety: Goal: Ability to remain free from injury will improve Outcome: Progressing   

## 2019-01-09 NOTE — Progress Notes (Signed)
Received call from patient wife at bedside 1725 and informed wife cannot stay overnight with patient. Reviewed visitnig hours. Wife requested call from MD if patient to have procedure.   Paged Ganji at [phone]. Patient's wife [name] (listed in chart) would like call in morning with updates & if patient to have any procedure. Upon callback Ganji indicated awaiting cardiothoracic decision regarding surgery. Informed wife at bedside and patient awaiting cardiothoracic.

## 2019-01-10 ENCOUNTER — Encounter (HOSPITAL_COMMUNITY): Payer: Self-pay | Admitting: Cardiology

## 2019-01-10 ENCOUNTER — Telehealth: Payer: Self-pay

## 2019-01-10 LAB — GLUCOSE, CAPILLARY
Glucose-Capillary: 102 mg/dL — ABNORMAL HIGH (ref 70–99)
Glucose-Capillary: 234 mg/dL — ABNORMAL HIGH (ref 70–99)
Glucose-Capillary: 174 mg/dL — ABNORMAL HIGH (ref 70–99)
Glucose-Capillary: 191 mg/dL — ABNORMAL HIGH (ref 70–99)

## 2019-01-10 MED ORDER — SODIUM CHLORIDE 0.9% FLUSH: 3.0000 mL | INTRAVENOUS | Status: DC | PRN

## 2019-01-10 MED ORDER — SODIUM CHLORIDE 0.9% FLUSH
3.0000 mL | Freq: Two times a day (BID) | INTRAVENOUS | Status: DC
  Administered 2019-01-10: 3 mL via INTRAVENOUS

## 2019-01-10 MED ORDER — SODIUM CHLORIDE 0.9 % IV SOLN: INTRAVENOUS | Status: DC

## 2019-01-10 MED ORDER — SODIUM CHLORIDE 0.9 % IV SOLN: 250.0000 mL | INTRAVENOUS | Status: DC | PRN

## 2019-01-10 MED ORDER — HEPARIN (PORCINE) IN NACL 1000-0.9 UT/500ML-% IV SOLN
INTRAVENOUS | Status: AC
  Filled 2019-01-10: qty 1000

## 2019-01-10 MED ORDER — LIDOCAINE HCL (PF) 1 % IJ SOLN
INTRAMUSCULAR | Status: AC
  Filled 2019-01-10: qty 30

## 2019-01-10 NOTE — Progress Notes (Signed)
Subjective:  No specific complaints. No chest pain or dyspnea at rest.  Intake/Output from previous day:  I/O last 3 completed shifts: In: 1860 [P.O.:1857; I.V.:3] Out: 2900 [Urine:2900] Total I/O In: 243 [P.O.:240; I.V.:3] Out: -   Blood pressure 126/70, pulse 61, temperature 97.6 F (36.4 C), temperature source Oral, resp. rate 18, height 5' 7" (1.702 m), weight 95.8 kg, SpO2 97 %. Physical Exam  Constitutional: He is oriented to person, place, and time. Vital signs are normal. He does not appear ill.  He is moderately built and mildly obese in no acute distress.  HENT:  Head: Normocephalic and atraumatic.  Cardiovascular: Normal rate, regular rhythm, normal heart sounds and intact distal pulses.  Pulses:      Femoral pulses are 2+ on the right side and 2+ on the left side.      Popliteal pulses are 1+ on the right side and 1+ on the left side.       Dorsalis pedis pulses are 0 on the right side and 0 on the left side.       Posterior tibial pulses are 0 on the right side and 0 on the left side.  No leg edema, no JVD.   Pulmonary/Chest: Effort normal and breath sounds normal. No accessory muscle usage. No respiratory distress.  Abdominal: Soft. Bowel sounds are normal. A hernia is present. Hernia confirmed positive in the ventral area (reducible).  Musculoskeletal:        General: Normal range of motion.     Cervical back: Normal range of motion and neck supple.  Neurological: He is alert and oriented to person, place, and time.  Skin: Skin is warm and dry.  Vitals reviewed.  Lab Results: BMP BNP (last 3 results) Recent Labs    01/03/19 2133  BNP 1,262.0*    ProBNP (last 3 results) No results for input(s): PROBNP in the last 8760 hours. BMP Latest Ref Rng & Units 01/09/2019 01/08/2019 01/07/2019  Glucose 70 - 99 mg/dL 125(H) - 164(H)  BUN 8 - 23 mg/dL 32(H) - 45(H)  Creatinine 0.61 - 1.24 mg/dL 1.61(H) 1.60(H) 1.50(H)  BUN/Creat Ratio 10 - 24 - - -  Sodium 135 -  145 mmol/L 136 - 133(L)  Potassium 3.5 - 5.1 mmol/L 4.7 - 4.5  Chloride 98 - 111 mmol/L 104 - 101  CO2 22 - 32 mmol/L 23 - 23  Calcium 8.9 - 10.3 mg/dL 9.5 - 9.0   Hepatic Function Latest Ref Rng & Units 01/03/2019 12/27/2018 10/12/2018  Total Protein 6.5 - 8.1 g/dL 8.0 6.6 6.8  Albumin 3.5 - 5.0 g/dL 4.3 3.8 3.9  AST 15 - 41 U/L _0 ALT 0 - 44 U/L _1 Alk Phosphatase 38 - 126 U/L 94 79 83  Total Bilirubin 0.3 - 1.2 mg/dL 0.6 0.4 0.7   CBC Latest Ref Rng & Units 01/08/2019 01/08/2019 01/07/2019  WBC 4.0 - 10.5 K/uL 7.5 8.0 7.5  Hemoglobin 13.0 - 17.0 g/dL 12.6(L) 12.6(L) 12.1(L)  Hematocrit 39.0 - 52.0 % 37.9(L) 37.4(L) 35.0(L)  Platelets 150 - 400 K/uL 198 200 189   Lipid Panel     Component Value Date/Time   CHOL 142 01/04/2019 0640   TRIG 148 01/04/2019 0640   HDL 45 01/04/2019 0640   CHOLHDL 3.2 01/04/2019 0640   VLDL 30 01/04/2019 0640   LDLCALC 67 01/04/2019 0640   LDLDIRECT 71.0 10/30/2018 1030   Cardiac Panel (last 3 results) No results for input(s): CKTOTAL, CKMB,  TROPONINI, RELINDX in the last 72 hours.  HEMOGLOBIN A1C Lab Results  Component Value Date   HGBA1C 11.6 (H) 12/27/2018   MPG 263 07/12/2015   TSH No results for input(s): TSH in the last 8760 hours. Imaging: No results found.  Cardiac Studies:  Renal artery duplex  09/02/2017: Right: Abnormal right Resistive Index. No evidence of right renal artery stenosis. Left: Normal left Resistive Index. No evidence of left renal artery stenosis. Mesenteric: Normal Celiac artery findings. 70 to 99% stenosis in the superior mesenteric artery.  Echocardiogram 01/04/2019:  1. Left ventricular ejection fraction, by visual estimation, is 25 to 30%. The left ventricle has severely decreased function. There is no left ventricular hypertrophy. Definity contrast agent was given IV to delineate the left ventricular endocardial borders.  Left ventricular diastolic parameters are consistent with Grade II  diastolic dysfunction (pseudonormalization).  Mildly dilated left ventricular internal cavity size. The left ventricle demonstrates global hypokinesis. 2. RV not well visualized.  3. Left atrial size was mildly dilated.  Left Heart Catheterization 01/08/19:  1. Severe underlying three-vessel coronary artery disease with patent LIMA to LAD. Chronically occluded SVG to OM. Severe in-stent restenosis in left main stent extending into the proximal left circumflex. In addition, there is significant restenosis in the stent placed in the left posterior AV groove artery. The RCA is known to be small in size and severely diseased. 2. Left ventricular angiography was not performed due to chronic kidney disease. LVEDP was 13 mmHg.  Scheduled Meds: . amiodarone  400 mg Oral BID  . aspirin EC  81 mg Oral Daily  . carvedilol  6.25 mg Oral BID WC  . cholecalciferol  2,000 Units Oral BID  . ezetimibe  10 mg Oral Daily  . heparin  5,000 Units Subcutaneous Q8H  . insulin aspart  0-15 Units Subcutaneous TID WC  . insulin aspart  0-5 Units Subcutaneous QHS  . insulin aspart protamine- aspart  25 Units Subcutaneous BID WC  . isosorbide mononitrate  30 mg Oral Daily  . liraglutide  1.8 mg Subcutaneous Daily  . nitroGLYCERIN  1 inch Topical Q6H  . omega-3 acid ethyl esters  2 g Oral BID  . potassium chloride  20 mEq Oral BID  . rosuvastatin  40 mg Oral q1800  . sertraline  50 mg Oral QHS  . sodium chloride flush  3 mL Intravenous Q12H  . ticagrelor  90 mg Oral BID   Continuous Infusions: . sodium chloride     PRN Meds:.sodium chloride, acetaminophen, nitroGLYCERIN, ondansetron (ZOFRAN) IV, sodium chloride flush  Assessment/Plan:  Martin Bush  is a 63 y.o. Caucasian male with CAD and history of CABG x2 in 2002 with only LIMA to LAD being patent, Circumflex coronary artery is super dominant. H/O  Left main stenting in 2015 and in 2016 he had complex PCI to in-stent restenotic circumflex/OM  bifurcation, due to recurrent restenosis, underwent stenting to left main and circumflex proximal and midsegment with implantation of 2 overlapping 3.5 x 20 and 3.0 x 20 mm  Synergy DES on 08/16/2017 when he presented with decompensated heart failure and recurrent VT.  Similar presentation on 01/03/2019, coronary angiography on 08/04/2017 revealing severely restenotic left main stent that extends into the circumflex proximal and also old mid circumflex stent.   1. Non-STEMI related to superdominant circumflex and left main stent restenosis. 2. Chronic renal failure stage III,  stable. 3. Chronic systolic and diastolic heart failure 4. VT S/P ICD  EKG 01/09/2019: AV paced rhythm,  biventricular pacemaker detected.  No further analysis.  Ventricular rate 60 bpm.  Recommendation: Patient remains asymptomatic at rest, on telemetry he has not had any significant arrhythmias, no clinical evidence of congestive heart failure, lungs are clear.  We will wait for surgical team to evaluate, once decision is made will decide upon whether he needs angioplasty for recurrent circumflex restenosis and left main restenosis versus angioplasty and stenting versus need for balloon angioplasty alone followed by consideration for laser atherectomy and repeat stenting.  Overall extremely guarded prognosis in view of the fact that he has not done well with any statins in the past.  Otherwise appropriate medical therapy.  He is presently on Brilinta awaiting surgical recommendation.  Adrian Prows, M.D. 01/10/2019, 10:16 AM [facility] Pager: [phone] Office: [phone] If no answer: [phone]

## 2019-01-10 NOTE — Progress Notes (Signed)
     [address]       [place] [address]             [phone]       Case was discussed in further detail with Dr. Cyndia Bent, and we both agree that surgical revascularization would be a very high risk in this patient.  We recommend that he undergo PCI and medical optimization.  Martin Bush

## 2019-01-10 NOTE — Plan of Care (Signed)
  Problem: Activity: Goal: Risk for activity intolerance will decrease Outcome: Progressing   Problem: Safety: Goal: Ability to remain free from injury will improve Outcome: Progressing   

## 2019-01-10 NOTE — Telephone Encounter (Signed)
Patient wife and wants to know if you can call her because they wont let her back into the hospital for his procedure  [phone]

## 2019-01-10 NOTE — H&P (View-Only) (Signed)
Subjective:  No specific complaints. No chest pain or dyspnea at rest.  Intake/Output from previous day:  I/O last 3 completed shifts: In: 1860 [P.O.:1857; I.V.:3] Out: 2900 [Urine:2900] Total I/O In: 243 [P.O.:240; I.V.:3] Out: -   Blood pressure 126/70, pulse 61, temperature 97.6 F (36.4 C), temperature source Oral, resp. rate 18, height 5' 7" (1.702 m), weight 95.8 kg, SpO2 97 %. Physical Exam  Constitutional: He is oriented to person, place, and time. Vital signs are normal. He does not appear ill.  He is moderately built and mildly obese in no acute distress.  HENT:  Head: Normocephalic and atraumatic.  Cardiovascular: Normal rate, regular rhythm, normal heart sounds and intact distal pulses.  Pulses:      Femoral pulses are 2+ on the right side and 2+ on the left side.      Popliteal pulses are 1+ on the right side and 1+ on the left side.       Dorsalis pedis pulses are 0 on the right side and 0 on the left side.       Posterior tibial pulses are 0 on the right side and 0 on the left side.  No leg edema, no JVD.   Pulmonary/Chest: Effort normal and breath sounds normal. No accessory muscle usage. No respiratory distress.  Abdominal: Soft. Bowel sounds are normal. A hernia is present. Hernia confirmed positive in the ventral area (reducible).  Musculoskeletal:        General: Normal range of motion.     Cervical back: Normal range of motion and neck supple.  Neurological: He is alert and oriented to person, place, and time.  Skin: Skin is warm and dry.  Vitals reviewed.  Lab Results: BMP BNP (last 3 results) Recent Labs    01/03/19 2133  BNP 1,262.0*    ProBNP (last 3 results) No results for input(s): PROBNP in the last 8760 hours. BMP Latest Ref Rng & Units 01/09/2019 01/08/2019 01/07/2019  Glucose 70 - 99 mg/dL 125(H) - 164(H)  BUN 8 - 23 mg/dL 32(H) - 45(H)  Creatinine 0.61 - 1.24 mg/dL 1.61(H) 1.60(H) 1.50(H)  BUN/Creat Ratio 10 - 24 - - -  Sodium 135 -  145 mmol/L 136 - 133(L)  Potassium 3.5 - 5.1 mmol/L 4.7 - 4.5  Chloride 98 - 111 mmol/L 104 - 101  CO2 22 - 32 mmol/L 23 - 23  Calcium 8.9 - 10.3 mg/dL 9.5 - 9.0   Hepatic Function Latest Ref Rng & Units 01/03/2019 12/27/2018 10/12/2018  Total Protein 6.5 - 8.1 g/dL 8.0 6.6 6.8  Albumin 3.5 - 5.0 g/dL 4.3 3.8 3.9  AST 15 - 41 U/L _0 ALT 0 - 44 U/L _1 Alk Phosphatase 38 - 126 U/L 94 79 83  Total Bilirubin 0.3 - 1.2 mg/dL 0.6 0.4 0.7   CBC Latest Ref Rng & Units 01/08/2019 01/08/2019 01/07/2019  WBC 4.0 - 10.5 K/uL 7.5 8.0 7.5  Hemoglobin 13.0 - 17.0 g/dL 12.6(L) 12.6(L) 12.1(L)  Hematocrit 39.0 - 52.0 % 37.9(L) 37.4(L) 35.0(L)  Platelets 150 - 400 K/uL 198 200 189   Lipid Panel     Component Value Date/Time   CHOL 142 01/04/2019 0640   TRIG 148 01/04/2019 0640   HDL 45 01/04/2019 0640   CHOLHDL 3.2 01/04/2019 0640   VLDL 30 01/04/2019 0640   LDLCALC 67 01/04/2019 0640   LDLDIRECT 71.0 10/30/2018 1030   Cardiac Panel (last 3 results) No results for input(s): CKTOTAL, CKMB,  TROPONINI, RELINDX in the last 72 hours.  HEMOGLOBIN A1C Lab Results  Component Value Date   HGBA1C 11.6 (H) 12/27/2018   MPG 263 07/12/2015   TSH No results for input(s): TSH in the last 8760 hours. Imaging: No results found.  Cardiac Studies:  Renal artery duplex  09/02/2017: Right: Abnormal right Resistive Index. No evidence of right renal artery stenosis. Left: Normal left Resistive Index. No evidence of left renal artery stenosis. Mesenteric: Normal Celiac artery findings. 70 to 99% stenosis in the superior mesenteric artery.  Echocardiogram 01/04/2019:  1. Left ventricular ejection fraction, by visual estimation, is 25 to 30%. The left ventricle has severely decreased function. There is no left ventricular hypertrophy. Definity contrast agent was given IV to delineate the left ventricular endocardial borders.  Left ventricular diastolic parameters are consistent with Grade II  diastolic dysfunction (pseudonormalization).  Mildly dilated left ventricular internal cavity size. The left ventricle demonstrates global hypokinesis. 2. RV not well visualized.  3. Left atrial size was mildly dilated.  Left Heart Catheterization 01/08/19:  1. Severe underlying three-vessel coronary artery disease with patent LIMA to LAD. Chronically occluded SVG to OM. Severe in-stent restenosis in left main stent extending into the proximal left circumflex. In addition, there is significant restenosis in the stent placed in the left posterior AV groove artery. The RCA is known to be small in size and severely diseased. 2. Left ventricular angiography was not performed due to chronic kidney disease. LVEDP was 13 mmHg.  Scheduled Meds: . amiodarone  400 mg Oral BID  . aspirin EC  81 mg Oral Daily  . carvedilol  6.25 mg Oral BID WC  . cholecalciferol  2,000 Units Oral BID  . ezetimibe  10 mg Oral Daily  . heparin  5,000 Units Subcutaneous Q8H  . insulin aspart  0-15 Units Subcutaneous TID WC  . insulin aspart  0-5 Units Subcutaneous QHS  . insulin aspart protamine- aspart  25 Units Subcutaneous BID WC  . isosorbide mononitrate  30 mg Oral Daily  . liraglutide  1.8 mg Subcutaneous Daily  . nitroGLYCERIN  1 inch Topical Q6H  . omega-3 acid ethyl esters  2 g Oral BID  . potassium chloride  20 mEq Oral BID  . rosuvastatin  40 mg Oral q1800  . sertraline  50 mg Oral QHS  . sodium chloride flush  3 mL Intravenous Q12H  . ticagrelor  90 mg Oral BID   Continuous Infusions: . sodium chloride     PRN Meds:.sodium chloride, acetaminophen, nitroGLYCERIN, ondansetron (ZOFRAN) IV, sodium chloride flush  Assessment/Plan:  Martin Bush  is a 63 y.o. Caucasian male with CAD and history of CABG x2 in 2002 with only LIMA to LAD being patent, Circumflex coronary artery is super dominant. H/O  Left main stenting in 2015 and in 2016 he had complex PCI to in-stent restenotic circumflex/OM  bifurcation, due to recurrent restenosis, underwent stenting to left main and circumflex proximal and midsegment with implantation of 2 overlapping 3.5 x 20 and 3.0 x 20 mm  Synergy DES on 08/16/2017 when he presented with decompensated heart failure and recurrent VT.  Similar presentation on 01/03/2019, coronary angiography on 08/04/2017 revealing severely restenotic left main stent that extends into the circumflex proximal and also old mid circumflex stent.   1. Non-STEMI related to superdominant circumflex and left main stent restenosis. 2. Chronic renal failure stage III,  stable. 3. Chronic systolic and diastolic heart failure 4. VT S/P ICD  EKG 01/09/2019: AV paced rhythm,  biventricular pacemaker detected.  No further analysis.  Ventricular rate 60 bpm.  Recommendation: Patient remains asymptomatic at rest, on telemetry he has not had any significant arrhythmias, no clinical evidence of congestive heart failure, lungs are clear.  We will wait for surgical team to evaluate, once decision is made will decide upon whether he needs angioplasty for recurrent circumflex restenosis and left main restenosis versus angioplasty and stenting versus need for balloon angioplasty alone followed by consideration for laser atherectomy and repeat stenting.  Overall extremely guarded prognosis in view of the fact that he has not done well with any statins in the past.  Otherwise appropriate medical therapy.  He is presently on Brilinta awaiting surgical recommendation.  Adrian Prows, M.D. 01/10/2019, 10:16 AM [facility] Pager: [phone] Office: [phone] If no answer: [phone]

## 2019-01-11 ENCOUNTER — Encounter (HOSPITAL_COMMUNITY)
Admission: AD | Disposition: A | Discharge status: Home / Self Care | Payer: Self-pay | Source: Other Acute Inpatient Hospital | Attending: Cardiology

## 2019-01-11 ENCOUNTER — Ambulatory Visit: Payer: No Typology Code available for payment source | Admitting: Cardiology

## 2019-01-11 DIAGNOSIS — Z95810 Presence of automatic (implantable) cardiac defibrillator: Secondary | ICD-10-CM

## 2019-01-11 DIAGNOSIS — I2510 Atherosclerotic heart disease of native coronary artery without angina pectoris: Secondary | ICD-10-CM

## 2019-01-11 HISTORY — PX: CORONARY BALLOON ANGIOPLASTY: CATH118233

## 2019-01-11 HISTORY — PX: LEFT HEART CATH AND CORONARY ANGIOGRAPHY: CATH118249

## 2019-01-11 LAB — CBC
HCT: 33.2 % — ABNORMAL LOW (ref 39.0–52.0)
Hemoglobin: 11.3 g/dL — ABNORMAL LOW (ref 13.0–17.0)
MCH: 31.2 pg (ref 26.0–34.0)
MCHC: 34 g/dL (ref 30.0–36.0)
MCV: 91.7 fL (ref 80.0–100.0)
Platelets: 181 10*3/uL (ref 150–400)
RBC: 3.62 MIL/uL — ABNORMAL LOW (ref 4.22–5.81)
RDW: 12.8 % (ref 11.5–15.5)
WBC: 7.9 10*3/uL (ref 4.0–10.5)
nRBC: 0 % (ref 0.0–0.2)

## 2019-01-11 LAB — BASIC METABOLIC PANEL
Anion gap: 8 (ref 5–15)
BUN: 29 mg/dL — ABNORMAL HIGH (ref 8–23)
CO2: 23 mmol/L (ref 22–32)
Calcium: 8.8 mg/dL — ABNORMAL LOW (ref 8.9–10.3)
Chloride: 103 mmol/L (ref 98–111)
Creatinine, Ser: 1.49 mg/dL — ABNORMAL HIGH (ref 0.61–1.24)
GFR calc Af Amer: 57 mL/min — ABNORMAL LOW (ref >60)
GFR calc non Af Amer: 49 mL/min — ABNORMAL LOW (ref >60)
Glucose, Bld: 120 mg/dL — ABNORMAL HIGH (ref 70–99)
Potassium: 4.7 mmol/L (ref 3.5–5.1)
Sodium: 134 mmol/L — ABNORMAL LOW (ref 135–145)

## 2019-01-11 LAB — GLUCOSE, CAPILLARY
Glucose-Capillary: 130 mg/dL — ABNORMAL HIGH (ref 70–99)
Glucose-Capillary: 122 mg/dL — ABNORMAL HIGH (ref 70–99)
Glucose-Capillary: 320 mg/dL — ABNORMAL HIGH (ref 70–99)

## 2019-01-11 LAB — POCT ACTIVATED CLOTTING TIME: Activated Clotting Time: 329 seconds

## 2019-01-11 SURGERY — CORONARY BALLOON ANGIOPLASTY
Anesthesia: LOCAL

## 2019-01-11 MED ORDER — SODIUM CHLORIDE 0.9% FLUSH: 3.0000 mL | Freq: Two times a day (BID) | INTRAVENOUS | Status: DC

## 2019-01-11 MED ORDER — HEPARIN SODIUM (PORCINE) 1000 UNIT/ML IJ SOLN
INTRAMUSCULAR | Status: DC | PRN
  Administered 2019-01-11: 10000 [IU] via INTRAVENOUS

## 2019-01-11 MED ORDER — AMIODARONE HCL 400 MG PO TABS: 200.0000 mg | ORAL_TABLET | Freq: Every day | ORAL | Status: DC

## 2019-01-11 MED ORDER — SODIUM CHLORIDE 0.9 % IV SOLN: INTRAVENOUS | Status: AC

## 2019-01-11 MED ORDER — LIDOCAINE HCL (PF) 1 % IJ SOLN
INTRAMUSCULAR | Status: AC
  Filled 2019-01-11: qty 30

## 2019-01-11 MED ORDER — MIDAZOLAM HCL 2 MG/2ML IJ SOLN
INTRAMUSCULAR | Status: AC
  Filled 2019-01-11: qty 2

## 2019-01-11 MED ORDER — FENTANYL CITRATE (PF) 100 MCG/2ML IJ SOLN
INTRAMUSCULAR | Status: DC | PRN
  Administered 2019-01-11: 25 ug via INTRAVENOUS

## 2019-01-11 MED ORDER — SODIUM CHLORIDE 0.9 % IV SOLN: 250.0000 mL | INTRAVENOUS | Status: DC | PRN

## 2019-01-11 MED ORDER — HEPARIN SODIUM (PORCINE) 1000 UNIT/ML IJ SOLN
INTRAMUSCULAR | Status: AC
  Filled 2019-01-11: qty 1

## 2019-01-11 MED ORDER — IOHEXOL 350 MG/ML SOLN
INTRAVENOUS | Status: DC | PRN
  Administered 2019-01-11: 65 mL

## 2019-01-11 MED ORDER — LIDOCAINE HCL (PF) 1 % IJ SOLN
INTRAMUSCULAR | Status: DC | PRN
  Administered 2019-01-11: 3 mL

## 2019-01-11 MED ORDER — NITROGLYCERIN 1 MG/10 ML FOR IR/CATH LAB
INTRA_ARTERIAL | Status: AC
  Filled 2019-01-11: qty 10

## 2019-01-11 MED ORDER — VERAPAMIL HCL 2.5 MG/ML IV SOLN
INTRAVENOUS | Status: DC | PRN
  Administered 2019-01-11: 10 mL via INTRA_ARTERIAL

## 2019-01-11 MED ORDER — HEPARIN (PORCINE) IN NACL 1000-0.9 UT/500ML-% IV SOLN
INTRAVENOUS | Status: AC
  Filled 2019-01-11: qty 1000

## 2019-01-11 MED ORDER — MIDAZOLAM HCL 2 MG/2ML IJ SOLN
INTRAMUSCULAR | Status: DC | PRN
  Administered 2019-01-11: 2 mg via INTRAVENOUS

## 2019-01-11 MED ORDER — HEPARIN (PORCINE) IN NACL 1000-0.9 UT/500ML-% IV SOLN
INTRAVENOUS | Status: DC | PRN
  Administered 2019-01-11 (×2): 500 mL

## 2019-01-11 MED ORDER — SODIUM CHLORIDE 0.9% FLUSH: 3.0000 mL | INTRAVENOUS | Status: DC | PRN

## 2019-01-11 MED ORDER — HYDRALAZINE HCL 20 MG/ML IJ SOLN: 5.0000 mg | INTRAMUSCULAR | Status: DC | PRN

## 2019-01-11 MED ORDER — FENTANYL CITRATE (PF) 100 MCG/2ML IJ SOLN
INTRAMUSCULAR | Status: AC
  Filled 2019-01-11: qty 2

## 2019-01-11 MED ORDER — NITROGLYCERIN 1 MG/10 ML FOR IR/CATH LAB
INTRA_ARTERIAL | Status: DC | PRN
  Administered 2019-01-11: 200 ug via INTRACORONARY

## 2019-01-11 MED ORDER — VERAPAMIL HCL 2.5 MG/ML IV SOLN
INTRAVENOUS | Status: AC
  Filled 2019-01-11: qty 2

## 2019-01-11 SURGICAL SUPPLY — 16 items
BALLN SAPPHIRE 2.5X20 (BALLOONS) ×2
BALLN WOLVERINE 3.50X10 (BALLOONS) ×2
BALLOON SAPPHIRE 2.5X20 (BALLOONS) IMPLANT
BALLOON WOLVERINE 3.50X10 (BALLOONS) IMPLANT
CATH VISTA GUIDE 6FR XB3.5 (CATHETERS) ×1 IMPLANT
DEVICE RAD COMP TR BAND LRG (VASCULAR PRODUCTS) ×1 IMPLANT
GLIDESHEATH SLEND A-KIT 6F 22G (SHEATH) ×1 IMPLANT
GUIDEWIRE INQWIRE 1.5J.035X260 (WIRE) IMPLANT
INQWIRE 1.5J .035X260CM (WIRE) ×2
KIT ENCORE 26 ADVANTAGE (KITS) ×1 IMPLANT
KIT HEART LEFT (KITS) ×2 IMPLANT
PACK CARDIAC CATHETERIZATION (CUSTOM PROCEDURE TRAY) ×2 IMPLANT
TRANSDUCER W/STOPCOCK (MISCELLANEOUS) ×2 IMPLANT
TUBING CIL FLEX 10 FLL-RA (TUBING) ×2 IMPLANT
WIRE COUGAR XT STRL 190CM (WIRE) ×1 IMPLANT
WIRE VIPERWIRE COR FLEX .012 (WIRE) ×1 IMPLANT

## 2019-01-11 NOTE — Discharge Summary (Signed)
Physician Discharge Summary  Patient ID: Martin Bush MRN: [id number] DOB/AGE: 1955/07/21 63 y.o.  Admit date: 01/08/2019 Discharge date: 01/11/2019  Primary Discharge Diagnosis Acute pulmonary edema and non-ST elevation MI secondary to progression of native vessel disease and in-stent restenosis to the left main, ostial circumflex and mid circumflex.  Dominant circumflex coronary artery. 2.  NSTEMI 3.  CAD of native vessel with unstable angina, CAD of bypass graft. 4.  Sustained ventricular tachycardia due to myocardial ischemia 5.  Acute renal failure on chronic kidney disease, stage IIIb 6.  Acute on chronic systolic and diastolic heart failure  Secondary Discharge Diagnosis H/O ICD; Biventricular  [employer] ICD Amplia MRI QWuad CRT-D  in situ 10/29/14. 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 No further analysis. Ventricular rate 60 bpm.  Left Heart Catheterization 01/08/19:  1. Severe underlying three-vessel coronary artery disease with patent LIMA to LAD. Chronically occluded SVG to OM. Severe in-stent restenosis in left main stent extending into the proximal left circumflex. In addition, there is significant restenosis in the stent placed in the left posterior AV groove artery. The RCA is known to be small in size and severely diseased. 2. Left ventricular angiography was not performed due to chronic kidney disease. LVEDP was 13 mmHg.  Coronary angioplasty of the left main and mid circumflex coronary artery 01/11/2019: Successful Wolverine cutting balloon angioplasty of the left main, 3.5 x 10 mm balloon utilized and high-pressure 12 atmospheric pressure inflations performed throughout the in-stent restenotic left main and proximal ostial circumflex and also mid circumflex coronary artery, 99% reduced to 0% with TIMI II to TIMI-3  flow.  60 mill contrast utilized.  Hospital Course: Patient presented with flash pulmonary edema and needing BiPAP support, had non-ST elevation MI, also developed acute renal failure, patient was stabilized and underwent coronary angiography which revealed complex left main, ostial circumflex and mid circumflex in-stent restenosis, circumflex is a dominant vessel.  Patent LIMA to LAD.  He has nondominant RCA.  Known occluded SVG to OM.  He was transferred to [facility] from [facility] for further evaluation, surgical consultation was obtained and who felt that he would be high risk and not a candidate.  So he underwent repeat angiography with angioplasty to the left main and circumflex with excellent result and felt stable to be discharged same day.  He will be followed up in the office within a week.  He will be continued with dual antiplatelet therapy. Discharge Exam: Blood pressure 126/68, pulse 60, temperature 98.3 F (36.8 C), temperature source Oral, resp. rate 14, height 5\' 7"  (1.702 m), weight 95.9 kg, SpO2 94 %.   Physical Exam  Constitutional: He is oriented to person, place, and time. Vital signs are normal. He does not appear ill.  He is moderately built and mildly obese in no acute distress.  HENT:  Head: Normocephalic and atraumatic.  Cardiovascular: Normal rate, regular rhythm, normal heart sounds and intact distal pulses.  Pulses:      Femoral pulses are 2+ on the right side and 2+ on the left side.      Popliteal pulses are 1+ on the right side and 1+ on the left side.       Dorsalis pedis pulses are 0 on the right side and 0 on the left side.       Posterior tibial pulses are 0 on the right side and  0 on the left side.  No leg edema, no JVD.   Pulmonary/Chest: Effort normal and breath sounds normal. No accessory muscle usage. No respiratory distress.  Abdominal: Soft. Bowel sounds are normal. A hernia is present. Hernia confirmed positive in the  ventral area (reducible).  Musculoskeletal:        General: Normal range of motion.     Cervical back: Normal range of motion and neck supple.  Neurological: He is alert and oriented to person, place, and time.  Skin: Skin is warm and dry.  Vitals reviewed.   Labs:   Lab Results  Component Value Date   WBC 7.9 01/11/2019   HGB 11.3 (L) 01/11/2019   HCT 33.2 (L) 01/11/2019   MCV 91.7 01/11/2019   PLT 181 01/11/2019    Recent Labs  Lab 01/11/19 0604  NA 134*  K 4.7  CL 103  CO2 23  BUN 29*  CREATININE 1.49*  CALCIUM 8.8*  GLUCOSE 120*    Lipid Panel     Component Value Date/Time   CHOL 142 01/04/2019 0640   TRIG 148 01/04/2019 0640   HDL 45 01/04/2019 0640   CHOLHDL 3.2 01/04/2019 0640   VLDL 30 01/04/2019 0640   LDLCALC 67 01/04/2019 0640    BNP (last 3 results) Recent Labs    01/03/19 2133  BNP 1,262.0*    HEMOGLOBIN A1C Lab Results  Component Value Date   HGBA1C 11.6 (H) 12/27/2018   MPG 263 07/12/2015    Cardiac Panel (last 3 results) No results for input(s): CKTOTAL, CKMB, TROPONINI, RELINDX in the last 8760 hours.  Lab Results  Component Value Date   TROPONINI 0.06 ([place]) 08/03/2017     TSH No results for input(s): TSH in the last 8760 hours.  Radiology: CT ANGIO CHEST PE W OR WO CONTRAST  Result Date: 01/04/2019 CLINICAL DATA:  PE suspected, intermediate prob, positive D-dimer. Respiratory distress. Hypoxia. EXAM: CT ANGIOGRAPHY CHEST WITH CONTRAST TECHNIQUE: Multidetector CT imaging of the chest was performed using the standard protocol during bolus administration of intravenous contrast. Multiplanar CT image reconstructions and MIPs were obtained to evaluate the vascular anatomy. CONTRAST:  133mL OMNIPAQUE IOHEXOL 350 MG/ML SOLN COMPARISON:  Chest radiograph earlier this day. No prior chest CT. FINDINGS: Cardiovascular: There are no filling defects within the pulmonary arteries to suggest pulmonary embolus. Moderate multi chamber  cardiomegaly. Left-sided pacemaker with leads in the right atrium and ventricle. Post CABG. Atherosclerosis of the thoracic aorta. Cannot assess for dissection given phase of contrast tailored to pulmonary artery evaluation. There is no pericardial effusion. Mediastinum/Nodes: Shotty mediastinal lymph nodes with 9 mm right lower paratracheal node. There is a 12 mm right hilar node. No visualized thyroid nodule. No esophageal thickening. Lungs/Pleura: Small right and trace left pleural effusion. Adjacent atelectasis in the right lower lobe. Smooth septal thickening with patchy ground-glass opacities within both lungs, slightly more prominent on the right. There is fluid within fissures bilaterally. Mild lower lobe bronchial thickening is likely congestive. No evidence of pulmonary mass. Trachea and mainstem bronchi are patent. Upper Abdomen: Fluid distended stomach without gastric wall thickening. Musculoskeletal: Post median sternotomy. Degenerative change in the spine. There are no acute or suspicious osseous abnormalities. Review of the MIP images confirms the above findings. IMPRESSION: 1. No pulmonary embolus. 2. CHF. Cardiomegaly with small bilateral pleural effusions and pulmonary edema. Ground-glass opacities in both lungs are most consistent with pulmonary edema, an element of superimposed atypical viral pneumonia cannot be excluded on imaging findings alone,  but is felt less likely. 3. Shotty mediastinal and right hilar lymph nodes are likely reactive. Aortic Atherosclerosis (ICD10-I70.0). Electronically Signed   By: Keith Rake M.D.   On: 01/04/2019 00:07   CARDIAC CATHETERIZATION  Result Date: 01/11/2019 Coronary angioplasty of the left main and mid circumflex coronary artery 01/11/2019: Successful Wolverine cutting balloon angioplasty of the left main, 3.5 x 10 mm balloon utilized and high-pressure 12 atmospheric pressure inflations performed throughout the in-stent restenotic left main and  proximal ostial circumflex and also mid circumflex coronary artery, 99% reduced to 0% with TIMI II to TIMI-3 flow.  60 mill contrast utilized. Recommendation: Patient will be discharged home today with outpatient follow-up.  He will continue with dual antiplatelet therapy.  CARDIAC CATHETERIZATION  Result Date: 01/08/2019  Ost LAD to Mid LAD lesion is 100% stenosed.  LIMA and is normal in caliber.  The graft exhibits no disease.  Mid LAD lesion is 50% stenosed.  Ost Cx to Prox Cx lesion is 99% stenosed.  Ost LM to Dist LM lesion is 99% stenosed.  3rd Mrg lesion is 60% stenosed.  LPAV lesion is 90% stenosed.  1.  Severe underlying three-vessel coronary artery disease with patent LIMA to LAD.  Chronically occluded SVG to OM.  Severe in-stent restenosis in left main stent extending into the proximal left circumflex.  In addition, there is significant restenosis in the stent placed in the left posterior AV groove artery.  The RCA is known to be small in size and severely diseased. 2.  Left ventricular angiography was not performed due to chronic kidney disease.  LVEDP was 13 mmHg. Recommendations: This is a difficult management situation overall.  It appears that the patient has very aggressive restenosis in both stents that were placed last year.  Although repeat balloon angioplasty is technically possible, the long-term results might not be durable.  Due to that, I think it is worth obtaining surgical consultation for redo CABG.  I discussed the case with Dr. Einar Gip.  We will transfer the patient to [facility] under Dr. Einar Gip service and we will consult CVTS for consult.  The patient will be continued on ticagrelor at the present time until revascularization decision is made.  Resume heparin 8 hours after sheath pull.   DG Chest Portable 1 View  Result Date: 01/03/2019 CLINICAL DATA:  Acute respiratory distress, O2 sat 70-80% upon EMS arrival EXAM: PORTABLE CHEST 1 VIEW COMPARISON:  Radiograph  08/20/2017 FINDINGS: Postsurgical changes related to prior CABG including intact and aligned sternotomy wires and multiple surgical clips projecting over the mediastinum. Coronary stent overlies the cardiac silhouette. Three lead pacer/defibrillator pack overlies the left chest wall with leads at the apex, right atrium, and coronary sinus. Telemetry leads overlie the chest. Diffuse interstitial and airspace opacities throughout the lungs with some fluid thickening the right minor fissure. There is marked cardiomegaly, increased from comparison. No visible pneumothorax or evidence of left pleural fluid. No acute osseous or soft tissue abnormality. IMPRESSION: Marked cardiomegaly with diffuse interstitial and airspace opacities throughout the lungs. The findings are most compatible with moderate to severe CHF/volume overload. Please note, infection or inflammation could present with similar findings. Electronically Signed   By: Lovena Le M.D.   On: 01/03/2019 22:14   ECHOCARDIOGRAM COMPLETE  Result Date: 01/04/2019   ECHOCARDIOGRAM REPORT   Patient Name:   Martin Bush Date of Exam: 01/04/2019 Medical Rec #:  [id number]     Height:       67.0 in  Accession #:    [id number]    Weight:       216.0 lb Date of Birth:  Feb 20, 1955    BSA:          2.09 m Patient Age:    53 years      BP:           119/81 mmHg Patient Gender: M             HR:           69 bpm. Exam Location:  [facility] Procedure: 2D Echo, Cardiac Doppler, Color Doppler and Intracardiac            Opacification Agent Indications:     Acute coronary syndrome 124.9  History:         Patient has prior history of Echocardiogram examinations, most                  recent 09/01/2017. AICD. Ischemic cardiomyopathy, MI, NSTEMI,                  OSA.  Sonographer:     Sherrie Sport RDCS (AE) Referring Phys:  [place] Diagnosing Phys: Ida Rogue MD  Sonographer Comments: Technically difficult study due to poor echo windows. IMPRESSIONS  1. Left  ventricular ejection fraction, by visual estimation, is 25 to 30%. The left ventricle has severely decreased function. There is no left ventricular hypertrophy.  2. Definity contrast agent was given IV to delineate the left ventricular endocardial borders.  3. Left ventricular diastolic parameters are consistent with Grade II diastolic dysfunction (pseudonormalization).  4. Mildly dilated left ventricular internal cavity size.  5. The left ventricle demonstrates global hypokinesis.  6. Global right ventricle was not well visualized.The right ventricular size is not well visualized. Right vetricular wall thickness was not assessed.  7. Left atrial size was mildly dilated.  8. TR signal is inadequate for assessing pulmonary artery systolic pressure. FINDINGS  Left Ventricle: Left ventricular ejection fraction, by visual estimation, is 25 to 30%. The left ventricle has severely decreased function. Definity contrast agent was given IV to delineate the left ventricular endocardial borders. The left ventricle demonstrates global hypokinesis. The left ventricular internal cavity size was mildly dilated left ventricle. There is no left ventricular hypertrophy. Left ventricular diastolic parameters are consistent with Grade II diastolic dysfunction (pseudonormalization). Normal left atrial pressure. Right Ventricle: The right ventricular size is not well visualized. Right vetricular wall thickness was not assessed. Global RV systolic function is was not well visualized. The tricuspid regurgitant velocity is 1.32 m/s, and with an assumed right atrial  pressure of 10 mmHg, the estimated right ventricular systolic pressure is TR signal is inadequate for assessing PA pressure at 17.0 mmHg. Left Atrium: Left atrial size was mildly dilated. Right Atrium: Right atrial size was normal in size Pericardium: There is no evidence of pericardial effusion. Mitral Valve: The mitral valve is normal in structure. Mild mitral valve  regurgitation. No evidence of mitral valve stenosis by observation. Tricuspid Valve: The tricuspid valve is normal in structure. Tricuspid valve regurgitation is not demonstrated. Aortic Valve: The aortic valve was not well visualized. Aortic valve regurgitation is not visualized. Mild to moderate aortic valve sclerosis/calcification is present, without any evidence of aortic stenosis. Aortic valve mean gradient measures 1.5 mmHg.  Aortic valve peak gradient measures 2.7 mmHg. Aortic valve area, by VTI measures 2.97 cm. Pulmonic Valve: The pulmonic valve was normal in structure. Pulmonic valve regurgitation is not  visualized. Pulmonic regurgitation is not visualized. Aorta: The aortic root, ascending aorta and aortic arch are all structurally normal, with no evidence of dilitation or obstruction. Venous: The inferior vena cava is normal in size with greater than 50% respiratory variability, suggesting right atrial pressure of 3 mmHg. IAS/Shunts: No atrial level shunt detected by color flow Doppler. There is no evidence of a patent foramen ovale. No ventricular septal defect is seen or detected. There is no evidence of an atrial septal defect.  LEFT VENTRICLE PLAX 2D LVIDd:         5.60 cm       Diastology LVIDs:         5.01 cm       LV e' lateral:   5.00 cm/s LV PW:         1.10 cm       LV E/e' lateral: 16.5 LV IVS:        1.05 cm       LV e' medial:    4.13 cm/s LVOT diam:     2.20 cm       LV E/e' medial:  20.0 LV SV:         35 ml LV SV Index:   15.93 LVOT Area:     3.80 cm  LV Volumes (MOD) LV area d, A2C:    36.10 cm LV area d, A4C:    43.40 cm LV area s, A2C:    30.80 cm LV area s, A4C:    38.10 cm LV major d, A2C:   7.77 cm LV major d, A4C:   8.55 cm LV major s, A2C:   7.78 cm LV major s, A4C:   8.10 cm LV vol d, MOD A2C: 144.0 ml LV vol d, MOD A4C: 187.0 ml LV vol s, MOD A2C: 107.0 ml LV vol s, MOD A4C: 149.0 ml LV SV MOD A2C:     37.0 ml LV SV MOD A4C:     187.0 ml LV SV MOD BP:      44.5 ml RIGHT  VENTRICLE RV Basal diam:  3.73 cm RV S prime:     8.05 cm/s TAPSE (M-mode): 3.8 cm LEFT ATRIUM              Index       RIGHT ATRIUM           Index LA diam:        4.30 cm  2.06 cm/m  RA Area:     22.00 cm LA Vol (A2C):   58.3 ml  27.90 ml/m RA Volume:   62.60 ml  29.96 ml/m LA Vol (A4C):   104.0 ml 49.77 ml/m LA Biplane Vol: 84.9 ml  40.63 ml/m  AORTIC VALVE                   PULMONIC VALVE AV Area (Vmax):    3.17 cm    PV Vmax:        0.54 m/s AV Area (Vmean):   2.68 cm    PV Peak grad:   1.2 mmHg AV Area (VTI):     2.97 cm    RVOT Peak grad: 1 mmHg AV Vmax:           82.50 cm/s AV Vmean:          54.150 cm/s AV VTI:            0.158 m AV Peak Grad:      2.7  mmHg AV Mean Grad:      1.5 mmHg LVOT Vmax:         68.70 cm/s LVOT Vmean:        38.200 cm/s LVOT VTI:          0.124 m LVOT/AV VTI ratio: 0.78  AORTA Ao Root diam: 2.90 cm MITRAL VALVE                        TRICUSPID VALVE MV Area (PHT): 4.10 cm             TR Peak grad:   7.0 mmHg MV PHT:        53.65 msec           TR Vmax:        132.00 cm/s MV Decel Time: 185 msec MV E velocity: 82.50 cm/s 103 cm/s  SHUNTS MV A velocity: 56.80 cm/s 70.3 cm/s Systemic VTI:  0.12 m MV E/A ratio:  1.45       1.5       Systemic Diam: 2.20 cm  Ida Rogue MD Electronically signed by Ida Rogue MD Signature Date/Time: 01/04/2019/6:49:04 PM    Final (Updated)    [facility] Discharge Instructions    AMB Referral to Cardiac Rehabilitation - Phase II   Complete by: As directed    Diagnosis:  PTCA NSTEMI     After initial evaluation and assessments completed: Virtual Based Care may be provided alone or in conjunction with Phase 2 Cardiac Rehab based on patient barriers.: Yes     Allergies as of 01/11/2019      Reactions   Diltiazem Rash      Medication List    TAKE these medications   acetaminophen 325 MG tablet Commonly known as: TYLENOL Take 2 tablets (650 mg total) by mouth every 4 (four) hours as needed for headache  or mild pain.   amiodarone 400 MG tablet Commonly known as: PACERONE Take 0.5 tablets (200 mg total) by mouth daily. What changed:   how much to take  when to take this   aspirin 81 MG EC tablet Take 1 tablet (81 mg total) by mouth daily.   carvedilol 6.25 MG tablet Commonly known as: COREG Take 1 tablet (6.25 mg total) by mouth 2 (two) times daily with a meal.   ezetimibe 10 MG tablet Commonly known as: ZETIA Take 1 tablet (10 mg total) by mouth at bedtime.   Fish Oil 1000 MG Caps Take 1,000 mg by mouth 2 (two) times daily.   insulin aspart protamine- aspart (70-30) 100 UNIT/ML injection Commonly known as: NOVOLOG MIX 70/30 Inject 0.25 mLs (25 Units total) into the skin 2 (two) times daily with a meal. What changed: how much to take   isosorbide mononitrate 30 MG 24 hr tablet Commonly known as: IMDUR Take 1 tablet (30 mg total) by mouth daily.   nitroGLYCERIN 2 % ointment Commonly known as: NITROGLYN Apply 1 inch topically every 6 (six) hours.   nitroGLYCERIN 0.4 MG SL tablet Commonly known as: NITROSTAT Place 1 tablet (0.4 mg total) under the tongue every 5 (five) minutes x 3 doses as needed for chest pain.   potassium chloride SA 20 MEQ tablet Commonly known as: KLOR-CON Take 1 tablet (20 mEq total) by mouth 2 (two) times daily.   PRESCRIPTION MEDICATION Inhale into the lungs at bedtime. CPAP   rosuvastatin 40 MG tablet Commonly known as: CRESTOR Take 1 tablet (  40 mg total) by mouth at bedtime.   sertraline 50 MG tablet Commonly known as: ZOLOFT Take 1 tablet (50 mg total) by mouth at bedtime.   ticagrelor 90 MG Tabs tablet Commonly known as: BRILINTA Take 1 tablet (90 mg total) by mouth 2 (two) times daily.   Victoza 18 MG/3ML Sopn Generic drug: liraglutide Inject 1.8 mg into the skin daily.   Vitamin D3 25 MCG tablet Commonly known as: Vitamin D Take 2 tablets (2,000 Units total) by mouth 2 (two) times daily.      Follow-up Information     Adrian Prows, MD On 01/09/2019.   Specialty: Cardiology Why: 10:15 AM. Bring all medications Contact information: [place] [phone]          Adrian Prows, MD 01/11/2019, 9:47 AM  Pager: [phone] Office: [phone] If no answer: [phone]

## 2019-01-11 NOTE — Interval H&P Note (Signed)
History and Physical Interval Note:  01/11/2019 8:04 AM  Martin Bush  has presented today for surgery, with the diagnosis of cad.  The various methods of treatment have been discussed with the patient and family. After consideration of risks, benefits and other options for treatment, the patient has consented to  Procedure(s): CORONARY STENT INTERVENTION (N/A) CORONARY ATHERECTOMY (N/A) as a surgical intervention.  The patient's history has been reviewed, patient examined, no change in status, stable for surgery.  I have reviewed the patient's chart and labs.  Questions were answered to the patient's satisfaction.   Cath Lab Visit (complete for each Cath Lab visit)  Clinical Evaluation Leading to the Procedure:   ACS: Yes.    Non-ACS:    Anginal Classification: CCS IV  Anti-ischemic medical therapy: Maximal Therapy (2 or more classes of medications)  Non-Invasive Test Results: No non-invasive testing performed  Prior CABG: Previous CABG  Adrian Prows

## 2019-01-11 NOTE — Progress Notes (Signed)
CARDIAC REHAB PHASE I   Discussed PCI, Brilinta, restrictions, MI, HF, weighing daily, diet, exercise, NTG and CRPII. Pt receptive, encouraged him to tighten up on diet, exercise and daily wts. Will refer to [facility]. He could do virtual or inperson.  Pt is interested in participating in Virtual Cardiac and Pulmonary Rehab. Pt advised that Virtual Cardiac and Pulmonary Rehab is provided at no cost to the patient.  Checklist:  1. Pt has smart device  ie smartphone and/or ipad for downloading an app  Yes 2. Reliable internet/wifi service    Yes 3. Understands how to use their smartphone and navigate within an app.  Yes Pt verbalized understanding and is in agreement. [id number]  [place], ACSM 01/11/2019 12:04 PM

## 2019-01-11 NOTE — Discharge Instructions (Signed)
Radial Site Care ° °This sheet gives you information about how to care for yourself after your procedure. Your health care provider may also give you more specific instructions. If you have problems or questions, contact your health care provider. °What can I expect after the procedure? °After the procedure, it is common to have: °· Bruising and tenderness at the catheter insertion area. °Follow these instructions at home: °Medicines °· Take over-the-counter and prescription medicines only as told by your health care provider. °Insertion site care °· Follow instructions from your health care provider about how to take care of your insertion site. Make sure you: °? Wash your hands with soap and water before you change your bandage (dressing). If soap and water are not available, use hand sanitizer. °? Change your dressing as told by your health care provider. °? Leave stitches (sutures), skin glue, or adhesive strips in place. These skin closures may need to stay in place for 2 weeks or longer. If adhesive strip edges start to loosen and curl up, you may trim the loose edges. Do not remove adhesive strips completely unless your health care provider tells you to do that. °· Check your insertion site every day for signs of infection. Check for: °? Redness, swelling, or pain. °? Fluid or blood. °? Pus or a bad smell. °? Warmth. °· Do not take baths, swim, or use a hot tub until your health care provider approves. °· You may shower 24-48 hours after the procedure, or as directed by your health care provider. °? Remove the dressing and gently wash the site with plain soap and water. °? Pat the area dry with a clean towel. °? Do not rub the site. That could cause bleeding. °· Do not apply powder or lotion to the site. °Activity ° °· For 24 hours after the procedure, or as directed by your health care provider: °? Do not flex or bend the affected arm. °? Do not push or pull heavy objects with the affected arm. °? Do not  drive yourself home from the hospital or clinic. You may drive 24 hours after the procedure unless your health care provider tells you not to. °? Do not operate machinery or power tools. °· Do not lift anything that is heavier than 10 lb (4.5 kg), or the limit that you are told, until your health care provider says that it is safe. °· Ask your health care provider when it is okay to: °? Return to work or school. °? Resume usual physical activities or sports. °? Resume sexual activity. °General instructions °· If the catheter site starts to bleed, raise your arm and put firm pressure on the site. If the bleeding does not stop, get help right away. This is a medical emergency. °· If you went home on the same day as your procedure, a responsible adult should be with you for the first 24 hours after you arrive home. °· Keep all follow-up visits as told by your health care provider. This is important. °Contact a health care provider if: °· You have a fever. °· You have redness, swelling, or yellow drainage around your insertion site. °Get help right away if: °· You have unusual pain at the radial site. °· The catheter insertion area swells very fast. °· The insertion area is bleeding, and the bleeding does not stop when you hold steady pressure on the area. °· Your arm or hand becomes pale, cool, tingly, or numb. °These symptoms may represent a serious problem   that is an emergency. Do not wait to see if the symptoms will go away. Get medical help right away. Call your local emergency services (911 in the [place] Do not drive yourself to the hospital. °Summary °· After the procedure, it is common to have bruising and tenderness at the site. °· Follow instructions from your health care provider about how to take care of your radial site wound. Check the wound every day for signs of infection. °· Do not lift anything that is heavier than 10 lb (4.5 kg), or the limit that you are told, until your health care provider says  that it is safe. °This information is not intended to replace advice given to you by your health care provider. Make sure you discuss any questions you have with your health care provider. °Document Released: 02/13/2010 Document Revised: 02/16/2017 Document Reviewed: 02/16/2017 °Elsevier Patient Education © 2020 [employer] ° °

## 2019-01-12 ENCOUNTER — Other Ambulatory Visit: Payer: Self-pay | Admitting: *Deleted

## 2019-01-12 NOTE — Consult Note (Signed)
   [facility] CM Inpatient Consult   01/12/2019  Martin Bush 02-Aug-1955 [id number]    Patientreviewed for 7 day and 30 day readmissions, with 25%highrisk score for unplanned readmission, 2 hospitalizations and 2 ED visits in the past 6 months under his [employer] benefits; and to check for potential [facility] care management services needed.  Chart reviewed andMD notes reveal as follows: Martin Bush is a 63 y.o. male with history of CAD s/p CABG x 2, s/p AICD, hypertension, hyperlipidemia, DM2, obesity, and OSA on CPAP with O2, and who is being transferred from [facility] to [facility]  for the CVTS evaluation of repeat CABG versus high risk PCI. 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 HIPAA verified.  Discussedwith patient about[facility] care management services and mentioned about his uncontrolled DM (recent A1C of 11.6).  Patient states that he has a blood sugar device but admits to not checking his blood sugar regularly and does not really follow diet restrictions, but trying as best as he can.  Patient endorses primary care provider as Dr. Tommi Bush with [facility], listed as providing transitionof care. Although he mentioned seeing a provider and obtaining medications from [facility]). He is aware of need for help to control and manage chronic health issues like DM. Patient verbally agreed with [facility] services as another layer of support. He denies any barriers with medications/ pharmacy and transportation. He states that wife Martin Bush) assists him with his needs at home.    Referral made to THNRNcaremanagementcoordinator for complex disease management of poor controlled DM and assessforfurther needs post discharge.   For questions and additional information, please call:  Lemoyne Scarpati A. Blossom Crume, BSN, RN-BC [facility] Liaison Cell: [phone]

## 2019-01-15 ENCOUNTER — Ambulatory Visit: Payer: No Typology Code available for payment source | Admitting: Family

## 2019-01-15 ENCOUNTER — Other Ambulatory Visit: Payer: Self-pay | Admitting: *Deleted

## 2019-01-15 ENCOUNTER — Encounter: Payer: Self-pay | Admitting: *Deleted

## 2019-01-15 ENCOUNTER — Telehealth: Payer: Self-pay

## 2019-01-15 NOTE — Patient Outreach (Addendum)
[place] [facility]) Care Management [place] Telephone Outreach PCP completes Transition of Care follow up post-hospital discharge Post-hospital discharge day # 4  01/15/2019  Diego Duble [birth year] [id number]  Successful telephone outreach to [name], 63 y/o male referred 01/12/2019 to [place] by [facility] Liaison after recent hospitalization December 9- 17, 2020 for acute pulmonary edema/ NSTEMI with native vessel disease and stent re-stenosis; patient had cardiac catherization 01/08/2019 and eventual PCI/ angioplasty.  Patient was discharged home to self care without home health services in place.  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  HIPAA/ identity verified with patient and [facility] CM services discussed with patient, who provides verbal consent to [facility] CM services/ involvement in his care post-recent hospital discharge.  Patient reports "doing good" and he denies pain/ new recent falls and sounds to be in no distress throughout phone call today.   Patient further reports:  Medications: -- Has all medicationsand takes as prescribed;denies questions/ concerns around current medications; able to accurately verbalize changes to medications made at time of hospital discharge and confirms that he has made the changes as instructed -- self-manage medications- takes directly out of prescriptions bottles -- denies issues with swallowing medications -- patient declines medication review today but agrees to complete at time of next outreach, after scheduled cardiology office visit  Provider appointments: -- All upcoming provider appointments were reviewed with patient today; upcoming cardiology office visit scheduled tomorrow, 01/16/2019; verbalizes plans to attend  Safety/ Mobility/ Falls: -- denies new/ recent falls- states no falls over last  year -- assistive devices: none -- general fall risks/ prevention education discussed with patient today  Holiday representative needs: -- currently denies community resource needs, stating supportive spouse that assist with care needs as indicated -- patient still drives and family provides transportation for patient to all provider appointments, errands, etc if indicated -- SDOH completed for: depression/ food insecurity/ financial resource strain/ transportation- no concerns identified  Scientist, physiological (AD) Planning:   -- reports does not currently have exisisting AD in place and would like information- will send through mail -- thoroughly discussed basics of Advanced Directive planning with patient  -- patient endorses being full code  Self-health management of CHF/ CAD/ DM: -- reports has had long extensive cardiac history with previous MI/ CABG/ stents/ ICD placement -- uses home O2 at 2 L/min "mainly at night only;" also uses with CPAP -- able to accurately verbalize signs/ symptoms MI along with corresponding action plan; patient states that he has NTG available for use at home, but has never needed to use; discussed specific safety precautions/ guidelines for use of NTG and encouraged patient to take only when sitting/ lying down; to space doses out by 5 minutes apart and to not take more than 3 in one episode chest pain; encouraged patient to contact EMS promptly should he experience chest pain needing > 1 dose NTG.  Made patient aware of decreased potency of medication once bottle is opened. -- has performed daily weights at home in the past, but has not consistently done over the last few months, stating, "I just kind of didn't feel like it;" patient is able to accurately verbalize weight gain guidelines in setting of CHF along with corresponding action plan; reports did not weight this morning, but weighed yesterday with weight of 211 lbs; reports general weight ranges  between  210-215 lbs.  Encouraged patient to resume monitoring/ recording daily weights at home and discussed value of daily weights as the earliest indicator of fluid retention; patient agrees today to resume daily weight monitoring at home -- DM: reports "usually" checks blood sugars QD/ fasting am; states that "they always run in the low 200's;" states has not checked this morning.  Patient was encouraged to check blood sugars regularly and confirms that he is familiar with signs/ symptoms hypo- hyper- glycemia and denies both.  States his blood sugars "always run high" and reports that he "seldom" runs low; confirms that he is able to "tell" when he runs low and he verbalizes an accurate understanding of action plan for episode sof hypoglycemia.  Patient denies further issues, concerns, or problems today.  I provided/ confirmed that patient has my direct phone number, the main [facility] CM office phone number, and the [facility] CM 24-hour nurse advice phone number should issues arise prior to next scheduled [place] outreach.  Encouraged patient to contact me directly if needs, questions, issues, or concerns arise prior to next scheduled outreach; patient agreed to do so.  Plan:  Patient will take medications as prescribed and will attend all scheduled provider appointments  Patient will promptly notify care providers for any new concerns/ issues/ problems that arise  Patient will resume monitoring/ recording daily weights at home and will follow weight gain guidelines and corresponding action plan for weight gain  I will make patient's PCP aware of [place] RN CM involvement in patient's care-- will send barriers letter  [place] outreach to continue with scheduled phone call in 2 weeks for completion of medication review and hopefully [facility] CM initial assessment  [facility] CM Care Plan Problem One     Most Recent Value  Care Plan Problem One  High Risk for hospital readmission, related to/ as  evidenced by recent hospital admission for NSTEMI/ with PCI  Role Documenting the Problem One  Care Management Coordinator  Care Plan for Problem One  Active  [facility] Long Term Goal   Over the next 31 dyas, patient will not experience hospital readmission as evidenced by patient reporting and review of EMR during [facility] RN CM outreach  [facility] Long Term Goal Start Date  01/15/19  Interventions for Problem One Long Term Goal  Discussed with patient his recent hospitalization and current clinical condition,  confirmed that patient has no current clinical concerns,  confirmed that patient has all prescribed medications and is able to verbalize recent hospital discharge changes to medications,  discussed safe use of NTG with patient and action plan for MI,  [facility] CM program initiated  [facility] CM Short Term Goal #1   Over the next 30 days, patient will resume monitoring and recording daily weights at home and will follow established action plan for weight gain, as evidenced by patient reporting during [facility] RN CM outreach  [facility] CM Short Term Goal #1 Start Date  01/15/19  Interventions for Short Term Goal #1  Discussed with patient his current routine for monitoring his self-health,  encouraged patient to resume daily weight monitoring and recording and confirmed that patient is able to independently verbalize weight gain guidelines in setting of CHF/ CAD, along with appropriate action plan for same  [facility] CM Short Term Goal #2   Over the next 30 days, patient will attend all scheduled provider appointments as evidenced by patient reporting and review of EMR with collaboration with care providers as  indicated during [place] outreach  [facility] CM Short Term Goal #2 Start Date  01/15/19  Interventions for Short Term Goal #2  Confirmed that patient has accurate understanding of scheduled provider office visits post- recent hospitalization and confirmed that he has stable and reliable transportation to all appointments,  encouraged patient  to consider making prompt appointment with PCP post-hospital discharge     I appreciate the opportunity to participate in Kailo's care,  Oneta Rack, RN, BSN, [employer] Coordinator [facility] Care Management  [phone]

## 2019-01-15 NOTE — Telephone Encounter (Signed)
Called patient to confirm follow up with cardiology for transitional care management. No answer. Follow as appropriate.

## 2019-01-16 ENCOUNTER — Ambulatory Visit (INDEPENDENT_AMBULATORY_CARE_PROVIDER_SITE_OTHER): Payer: No Typology Code available for payment source | Admitting: Cardiology

## 2019-01-16 ENCOUNTER — Encounter: Payer: Self-pay | Admitting: Cardiology

## 2019-01-16 ENCOUNTER — Other Ambulatory Visit: Payer: Self-pay

## 2019-01-16 VITALS — BP 122/61 | HR 75 | Ht 67.0 in | Wt 216.9 lb

## 2019-01-16 DIAGNOSIS — I214 Non-ST elevation (NSTEMI) myocardial infarction: Secondary | ICD-10-CM | POA: Diagnosis not present

## 2019-01-16 DIAGNOSIS — I5042 Chronic combined systolic (congestive) and diastolic (congestive) heart failure: Secondary | ICD-10-CM | POA: Diagnosis not present

## 2019-01-16 DIAGNOSIS — IMO0002 Reserved for concepts with insufficient information to code with codable children: Secondary | ICD-10-CM

## 2019-01-16 DIAGNOSIS — E1165 Type 2 diabetes mellitus with hyperglycemia: Secondary | ICD-10-CM

## 2019-01-16 DIAGNOSIS — E1122 Type 2 diabetes mellitus with diabetic chronic kidney disease: Secondary | ICD-10-CM

## 2019-01-16 DIAGNOSIS — I25810 Atherosclerosis of coronary artery bypass graft(s) without angina pectoris: Secondary | ICD-10-CM

## 2019-01-16 DIAGNOSIS — Z95810 Presence of automatic (implantable) cardiac defibrillator: Secondary | ICD-10-CM

## 2019-01-16 DIAGNOSIS — N183 Chronic kidney disease, stage 3 unspecified: Secondary | ICD-10-CM

## 2019-01-16 DIAGNOSIS — I255 Ischemic cardiomyopathy: Secondary | ICD-10-CM

## 2019-01-16 NOTE — Telephone Encounter (Signed)
Second attempt to reach patient for tcm. No answer. Patient to follow up with cardiology.

## 2019-01-16 NOTE — Progress Notes (Signed)
Primary Physician/Referring:  [name], MD  Patient ID: Martin Bush, male    DOB: 12/15/1955, 63 y.o.   MRN: [id number]  Chief Complaint  Patient presents with  . Coronary Artery Disease  . Congestive Heart Failure  . Transitions Of Care   HPI:    Martin Bush  is a 63 y.o.  Caucasian male  with CAD and history of CABG x2 in 2002 with only LIMA to LAD being patent, Circumflex coronary artery is super dominant. H/O  Left main stenting in 2015 and in 2016 he had complex PCI to in-stent restenotic circumflex/OM bifurcation, due to recurrent restenosis, underwent stenting to left main and circumflex proximal and midsegment with implantation of 2 overlapping 3.5 x 20 and 3.0 x 20 mm  Synergy DES on 08/16/2017 when he presented with decompensated heart failure and recurrent VT.  Similar presentation on 01/03/2019, coronary angiography on 01/05/2019 revealing severely restenotic left main stent that extends into the circumflex proximal and also old mid circumflex stent and successful Wolverine 3.5 mm cutting balloon angioplasty on 01/11/19. He also had slow VT but stable hemodynamics at presentation. He is on chronic amiodarone.   He is feeling well and has not had any further dyspnea, palpitations or chest pain.   Past Medical History:  Diagnosis Date  . AICD (automatic cardioverter/defibrillator) present    Medtronic  . Arthritis    "thumbs"  (08/02/2017)  . CHF (congestive heart failure) ([place])   . Chronic combined systolic and diastolic heart failure ([place])    a. 08/2017 Echo: EF 20-25%, mod glob HK. Sev distal ant sept, inflat HK. Apical AK. Gr2 DD. Mildly reduced RV fxn.  . Colon polyps   . Coronary artery disease    a. s/p CABG x 2 (LIMA->LAD, VG->OM); b. Multiple PCI's to LM/LCX/OM; c. 07/2017 PTCA of LM/LCX w/ early ISR-->repeat PCI/DES to LM (3.5x20 Synergy DES) and LCX (3.0x20 Synergy DES); d. 07/2018 Relook Cath: LM patent stent, LAD 100ost, LCX patent stent, OM1 99/60,  LIMA->LAD ok. VG->dLCX 53 (old).  . Depression   . Encounter for assessment of implantable cardioverter-defibrillator (ICD) 09/27/2018  . High cholesterol   . Hypertension   . ICD; Biventricular  Medtronic ICD Amplia MRI QWuad CRT-D  in situ 10/29/14 10/29/2014   Remote ICD check 09.23.20:  One 6 beat NSVT. No therapy.  1 SVT episode @ 130 bpm (38 Sec).  There were 23 Vent sense episodes detected for up to 1.1 min/day (AT). Health trends (patient activity, heart rate variability, average heart rates) are stable.Trans-thoracic impedance trends and the OptiVol Fluid Index do no present significant abnormalities. Battery longevity is 4.3 years. RA pacing is 3  . Ischemic cardiomyopathy 09/27/2018  . MI (myocardial infarction) ([place]) 2003  . NSTEMI (non-ST elevated myocardial infarction) ([place]) 07/16/2014  . OSA on CPAP   . Oxygen deficiency   . Pneumonia 10/2014  . Proteinuria   . Sleep apnea   . Type II diabetes mellitus (HCC)    insulin dependent   Past Surgical History:  Procedure Laterality Date  . BIOPSY  09/19/2018   Procedure: BIOPSY;  Surgeon: Thornton Park, MD;  Location: [facility];  Service: Gastroenterology;;  . CARDIAC CATHETERIZATION N/A 07/16/2014   Procedure: Left Heart Cath and Coronary Angiography;  Surgeon: Adrian Prows, MD;  Location: [place] [facility];  Service: Cardiovascular;  Laterality: N/A;  . CARDIAC CATHETERIZATION  07/16/2014   Procedure: Coronary Balloon Angioplasty;  Surgeon: Adrian Prows, MD;  Location: [place] [facility];  Service: Cardiovascular;;  . CARDIAC CATHETERIZATION  2003  . CARDIAC DEFIBRILLATOR PLACEMENT  2016  . CATARACT EXTRACTION W/ INTRAOCULAR LENS IMPLANT Left 03/2014  . COLONOSCOPY    . COLONOSCOPY WITH PROPOFOL N/A 09/19/2018   Procedure: COLONOSCOPY WITH PROPOFOL;  Surgeon: Thornton Park, MD;  Location: [facility];  Service: Gastroenterology;  Laterality: N/A;  . CORONARY ANGIOPLASTY WITH STENT PLACEMENT     "I've got a total of 8 stents  in there; mostly doine at [facility]" (08/02/2017)  . CORONARY ARTERY BYPASS GRAFT  ~ 2003   "CABG X2"; [facility]  . CORONARY ATHERECTOMY N/A 08/16/2017   Procedure: CORONARY ATHERECTOMY;  Surgeon: Nigel Mormon, MD;  Location: [place] [facility];  Service: Cardiovascular;  Laterality: N/A;  . CORONARY BALLOON ANGIOPLASTY N/A 08/04/2017   Procedure: CORONARY BALLOON ANGIOPLASTY;  Surgeon: Adrian Prows, MD;  Location: [place] [facility];  Service: Cardiovascular;  Laterality: N/A;  . CORONARY BALLOON ANGIOPLASTY N/A 01/11/2019   Procedure: CORONARY BALLOON ANGIOPLASTY;  Surgeon: Adrian Prows, MD;  Location: [place] [facility];  Service: Cardiovascular;  Laterality: N/A;  . CORONARY STENT INTERVENTION N/A 08/16/2017   Procedure: CORONARY STENT INTERVENTION;  Surgeon: Nigel Mormon, MD;  Location: [place] [facility];  Service: Cardiovascular;  Laterality: N/A;  . ELBOW SURGERY Left ?2001   "pinched nerve"  . ENDOSCOPIC MUCOSAL RESECTION  12/18/2018   Procedure: ENDOSCOPIC MUCOSAL RESECTION;  Surgeon: Rush Landmark Telford Nab., MD;  Location: [place];  Service: Gastroenterology;;  . Otho Darner SIGMOIDOSCOPY N/A 12/18/2018   Procedure: Beryle Quant;  Surgeon: Irving Copas., MD;  Location: [place];  Service: Gastroenterology;  Laterality: N/A;  . HEMOSTASIS CLIP PLACEMENT  12/18/2018   Procedure: HEMOSTASIS CLIP PLACEMENT;  Surgeon: Irving Copas., MD;  Location: [place];  Service: Gastroenterology;;  . LEFT HEART CATH AND CORONARY ANGIOGRAPHY N/A 01/11/2019   Procedure: LEFT HEART CATH AND CORONARY ANGIOGRAPHY;  Surgeon: Adrian Prows, MD;  Location: [place] [facility];  Service: Cardiovascular;  Laterality: N/A;  . LEFT HEART CATH AND CORS/GRAFTS ANGIOGRAPHY N/A 08/04/2017   Procedure: LEFT HEART CATH AND CORS/GRAFTS ANGIOGRAPHY;  Surgeon: Adrian Prows, MD;  Location: [place] [facility];  Service: Cardiovascular;  Laterality: N/A;  . LEFT HEART CATH  AND CORS/GRAFTS ANGIOGRAPHY N/A 08/20/2017   Procedure: LEFT HEART CATH AND CORS/GRAFTS ANGIOGRAPHY;  Surgeon: Nigel Mormon, MD;  Location: [place] [facility];  Service: Cardiovascular;  Laterality: N/A;  . LEFT HEART CATH AND CORS/GRAFTS ANGIOGRAPHY N/A 01/08/2019   Procedure: LEFT HEART CATH AND CORS/GRAFTS ANGIOGRAPHY;  Surgeon: Wellington Hampshire, MD;  Location: [place] [facility];  Service: Cardiovascular;  Laterality: N/A;  . POLYPECTOMY  09/19/2018   Procedure: POLYPECTOMY;  Surgeon: Thornton Park, MD;  [facility]: [facility];  Service: Gastroenterology;;  . Lia Foyer INJECTION  09/19/2018   Procedure: SUBMUCOSAL INJECTION;  Surgeon: Thornton Park, MD;  Location: [facility];  Service: Gastroenterology;;  . Lia Foyer LIFTING INJECTION  12/18/2018   Procedure: SUBMUCOSAL LIFTING INJECTION;  Surgeon: Irving Copas., MD;  Location: [facility];  Service: Gastroenterology;;   Social History   Socioeconomic History  . Marital status: Married    Spouse name: Not on file  . Number of children: 3  . Years of education: Not on file  . Highest education level: Not on file  Occupational History  . Not on file  Tobacco Use  . Smoking status: Former Smoker    Years: 27.00    Types: Cigars    Quit  date: 2002    Years since quitting: 18.9  . Smokeless tobacco: Former Systems developer    Quit date: 2002  Substance and Sexual Activity  . Alcohol use: Not Currently  . Drug use: Never  . Sexual activity: Not Currently  Other Topics Concern  . Not on file  Social History Narrative  . Not on file   Social Determinants of Health   Financial Resource Strain: Low Risk   . Difficulty of Paying Living Expenses: Not hard at all  Food Insecurity: No Food Insecurity  . Worried About Charity fundraiser in the Last Year: Never true  . Ran Out of Food in the Last Year: Never true  Transportation Needs: No Transportation Needs  . Lack of Transportation (Medical): No  . Lack of  Transportation (Non-Medical): No  Physical Activity:   . Days of Exercise per Week: Not on file  . Minutes of Exercise per Session: Not on file  Stress:   . Feeling of Stress : Not on file  Social Connections:   . Frequency of Communication with Friends and Family: Not on file  . Frequency of Social Gatherings with Friends and Family: Not on file  . Attends Religious Services: Not on file  . Active Member of Clubs or Organizations: Not on file  . Attends Archivist Meetings: Not on file  . Marital Status: Not on file  Intimate Partner Violence:   . Fear of Current or Ex-Partner: Not on file  . Emotionally Abused: Not on file  . Physically Abused: Not on file  . Sexually Abused: Not on file   ROS  Review of Systems  Constitution: Negative for decreased appetite, malaise/fatigue, weight gain and weight loss.  Eyes: Negative for visual disturbance.  Cardiovascular: Positive for dyspnea on exertion. Negative for chest pain, claudication, leg swelling, orthopnea, palpitations and syncope.  Respiratory: Negative for hemoptysis and wheezing.   Endocrine: Negative for cold intolerance and heat intolerance.  Hematologic/Lymphatic: Does not bruise/bleed easily.  Skin: Negative for nail changes.  Musculoskeletal: Negative for muscle weakness and myalgias.  Gastrointestinal: Negative for abdominal pain, change in bowel habit, nausea and vomiting.  Neurological: Negative for difficulty with concentration, dizziness, focal weakness and headaches.  Psychiatric/Behavioral: Negative for altered mental status and suicidal ideas.  All other systems reviewed and are negative.  Objective  Blood pressure 122/61, pulse 75, height 5\' 7"  (1.702 m), weight 216 lb 14.4 oz (98.4 kg), SpO2 98 %.  Vitals with BMI 01/16/2019 01/11/2019 01/11/2019  Height 5\' 7"  - -  Weight 216 lbs 14 oz - -  BMI Q000111Q - -  Systolic 123XX123 Q000111Q [id number]  Diastolic 61 76 78  Pulse 75 60 60     Physical Exam    Constitutional: He is oriented to person, place, and time. Vital signs are normal. He does not appear ill.  He is moderately built and mildly obese in no acute distress.  HENT:  Head: Normocephalic and atraumatic.  Cardiovascular: Normal rate, regular rhythm, normal heart sounds and intact distal pulses.  Pulses:      Femoral pulses are 2+ on the right side and 2+ on the left side.      Popliteal pulses are 1+ on the right side and 1+ on the left side.       Dorsalis pedis pulses are 0 on the right side and 0 on the left side.       Posterior tibial pulses are 0 on the right side and 0  on the left side.  No leg edema, no JVD.   Pulmonary/Chest: Effort normal and breath sounds normal. No accessory muscle usage. No respiratory distress.  Abdominal: Soft. Bowel sounds are normal. A hernia is present. Hernia confirmed positive in the ventral area (reducible).  obese  Musculoskeletal:        General: Normal range of motion.     Cervical back: Normal range of motion and neck supple.  Neurological: He is alert and oriented to person, place, and time.  Skin: Skin is warm and dry.  Vitals reviewed.  Laboratory examination:   Recent Labs    01/07/19 0443 01/08/19 2314 01/09/19 0546 01/11/19 0604  NA 133*  --  136 134*  K 4.5  --  4.7 4.7  CL 101  --  104 103  CO2 23  --  23 23  GLUCOSE 164*  --  125* 120*  BUN 45*  --  32* 29*  CREATININE 1.50* 1.60* 1.61* 1.49*  CALCIUM 9.0  --  9.5 8.8*  GFRNONAA 49* 45* 45* 49*  GFRAA 57* 52* 52* 57*   estimated creatinine clearance is 56.7 mL/min (A) (by C-G formula based on SCr of 1.49 mg/dL (H)).  CMP Latest Ref Rng & Units 01/11/2019 01/09/2019 01/08/2019  Glucose 70 - 99 mg/dL 120(H) 125(H) -  BUN 8 - 23 mg/dL 29(H) 32(H) -  Creatinine 0.61 - 1.24 mg/dL 1.49(H) 1.61(H) 1.60(H)  Sodium 135 - 145 mmol/L 134(L) 136 -  Potassium 3.5 - 5.1 mmol/L 4.7 4.7 -  Chloride 98 - 111 mmol/L 103 104 -  CO2 22 - 32 mmol/L 23 23 -  Calcium 8.9 - 10.3  mg/dL 8.8(L) 9.5 -  Total Protein 6.5 - 8.1 g/dL - - -  Total Bilirubin 0.3 - 1.2 mg/dL - - -  Alkaline Phos 38 - 126 U/L - - -  AST 15 - 41 U/L - - -  ALT 0 - 44 U/L - - -   CBC Latest Ref Rng & Units 01/11/2019 01/08/2019 01/08/2019  WBC 4.0 - 10.5 K/uL 7.9 7.5 8.0  Hemoglobin 13.0 - 17.0 g/dL 11.3(L) 12.6(L) 12.6(L)  Hematocrit 39.0 - 52.0 % 33.2(L) 37.9(L) 37.4(L)  Platelets 150 - 400 K/uL 181 198 200   Lipid Panel     Component Value Date/Time   CHOL 142 01/04/2019 0640   TRIG 148 01/04/2019 0640   HDL 45 01/04/2019 0640   CHOLHDL 3.2 01/04/2019 0640   VLDL 30 01/04/2019 0640   LDLCALC 67 01/04/2019 0640   LDLDIRECT 71.0 10/30/2018 1030   HEMOGLOBIN A1C Lab Results  Component Value Date   HGBA1C 11.6 (H) 12/27/2018   MPG 263 07/12/2015   TSH No results for input(s): TSH in the last 8760 hours.  Medications and allergies   Allergies  Allergen Reactions  . Diltiazem Rash     Current Outpatient Medications  Medication Instructions  . acetaminophen (TYLENOL) 650 mg, Oral, Every 4 hours PRN  . amiodarone (PACERONE) 200 mg, Oral, Daily  . aspirin 81 mg, Oral, Daily  . carvedilol (COREG) 6.25 mg, Oral, 2 times daily with meals  . ezetimibe (ZETIA) 10 mg, Oral, Daily at bedtime  . Fish Oil 1,000 mg, Oral, 2 times daily  . furosemide (LASIX) 40 mg, Oral, Daily  . insulin aspart protamine- aspart (NOVOLOG MIX 70/30) (70-30) 100 UNIT/ML injection 25 Units, Subcutaneous, 2 times daily with meals  . isosorbide mononitrate (IMDUR) 30 mg, Oral, Daily  . liraglutide (VICTOZA) 1.8 mg, Subcutaneous, Daily  .  nitroGLYCERIN (NITROSTAT) 0.4 mg, Sublingual, Every 5 min x3 PRN  . potassium chloride SA (KLOR-CON) 20 MEQ tablet 20 mEq, Oral, 2 times daily  . PRESCRIPTION MEDICATION Inhalation, Daily at bedtime, CPAP  . rosuvastatin (CRESTOR) 40 mg, Oral, Daily at bedtime  . sertraline (ZOLOFT) 50 mg, Oral, Daily at bedtime  . ticagrelor (BRILINTA) 90 mg, Oral, 2 times daily  .  Vitamin D3 (VITAMIN D) 2,000 Units, Oral, 2 times daily    Radiology:  No results found.  Cardiac Studies:   Renal artery duplex  09/02/2017: Right: Abnormal right Resistive Index. No evidence of right renal  artery stenosis. Left:  Normal left Resistive Index. No evidence of left renal artery stenosis. Mesenteric: Normal Celiac artery findings. 70 to 99% stenosis in the superior mesenteric artery.  Echocardiogram 01/04/2019:  1. Left ventricular ejection fraction, by visual estimation, is 25 to 30%. The left ventricle has severely decreased function. There is no left ventricular hypertrophy. Definity contrast agent was given IV to delineate the left ventricular endocardial borders.  Left ventricular diastolic parameters are consistent with Grade II diastolic dysfunction (pseudonormalization).  Mildly dilated left ventricular internal cavity size. The left ventricle demonstrates global hypokinesis. 2. RV not well visualized.  3. Left atrial size was mildly dilated.  Left Heart Catheterization 01/08/19:  1. Severe underlying three-vessel coronary artery disease with patent LIMA to LAD. Chronically occluded SVG to OM. Severe in-stent restenosis in left main stent extending into the proximal left circumflex. In addition, there is significant restenosis in the stent placed in the left posterior AV groove artery. The RCA is known to be small in size and severely diseased. 2. Left ventricular angiography was not performed due to chronic kidney disease. LVEDP was 13 mmHg.  Coronary angioplasty of the left main and mid circumflex coronary artery 01/11/2019: Successful Wolverine cutting balloon angioplasty of the left main, 3.5 x 10 mm balloon utilized and high-pressure 12 atmospheric pressure inflations performed throughout the in-stent restenotic left main and proximal ostial circumflex and also mid circumflex coronary artery, 99% reduced to 0% with TIMI II to TIMI-3 flow. 60 mill contrast  utilized.  Assessment     ICD-10-CM   1. NSTEMI (non-ST elevated myocardial infarction) ([place])  I21.4   2. Coronary artery disease involving autologous artery coronary bypass graft without angina pectoris  I25.810 EKG 12-Lead  3. Chronic combined systolic and diastolic heart failure (HCC)  I50.42   4. Ischemic cardiomyopathy  I25.5   5. Uncontrolled type 2 diabetes with stage 3 chronic kidney disease GFR 30-59 (HCC)  E11.22    E11.65    N18.30   6. ICD; Biventricular  Medtronic ICD Amplia MRI QWuad CRT-D  in situ 10/29/14  Z95.810     EKG 01/16/2019: AV paced rhythm at rate of 77 bpm, no further analysis.    Scheduled Remote ICD check 12.23.20:  One VR episode. No therapy. VF detection may be delayed: VF Detection Interval is faster than 300 ms (200 bpm). No AHR episodes.  OptiVol fluid accumulation: 21-Dec-2018 -- 06-Jan-2019. Battery longevity is 3.6 years. RA pacing is 66.1%, RV pacing is 99 %, and LV pacing is 99 %.  Recommendations:  No orders of the defined types were placed in this encounter.   Martin Bush  is a 63 y.o. Caucasian male with CAD and history of CABG x2 in 2002 with only LIMA to LAD being patent, Circumflex coronary artery is super dominant. H/O  Left main stenting in 2015 and in 2016 he had complex  PCI to in-stent restenotic circumflex/OM bifurcation, due to recurrent restenosis, underwent stenting to left main and circumflex proximal and midsegment with implantation of 2 overlapping 3.5 x 20 and 3.0 x 20 mm  Synergy DES on 08/16/2017 when he presented with decompensated heart failure and recurrent VT.  Similar presentation on 01/03/2019, coronary angiography on 01/04/2018 revealing severely restenotic left main stent that extends into the circumflex proximal and also old mid circumflex stent and successful Wolverine 3.5 mm cutting balloon angioplasty on 01/11/19.   Past medical history significant for uncontrolled diabetes mellitus, hypertension, chronic systolic and  diastolic heart failure, stage III chronic kidney disease, obstructive sleep apnea on CPAP. Renal function now back to baseline. I have discussed the gravity of the situation with him regarding his poor diet and poor long term prognosis.   Is presently doing well, no clinical evidence of heart failure, I also reviewed his ICD data from 01/17/2019 which not surprisingly shows CHF and VT. I will see him back in 4 weeks.   Adrian Prows, MD, [facility] 01/20/2019, 9:33 AM [facility] Pager: [phone] Office: [phone]

## 2019-01-20 DIAGNOSIS — Z95810 Presence of automatic (implantable) cardiac defibrillator: Secondary | ICD-10-CM | POA: Diagnosis not present

## 2019-01-20 DIAGNOSIS — Z4502 Encounter for adjustment and management of automatic implantable cardiac defibrillator: Secondary | ICD-10-CM | POA: Diagnosis not present

## 2019-01-20 DIAGNOSIS — I5042 Chronic combined systolic (congestive) and diastolic (congestive) heart failure: Secondary | ICD-10-CM

## 2019-01-22 NOTE — Progress Notes (Signed)
Patient ID: Martin Bush, male    DOB: 02-02-1955, 63 y.o.   MRN: [id number]  HPI  Martin Bush is a 63 y/o male with a history of CAD, DM, hyperlipidemia, HTN, depression, obstructive sleep apnea, previous tobacco use and chronic heart failure.   Echo report from 01/04/2019 showed an EF of 25-30%.  Catheterization done and showed:  Ost LAD to Mid LAD lesion is 100% stenosed.  LIMA and is normal in caliber.  The graft exhibits no disease.  Mid LAD lesion is 50% stenosed.  Ost Cx to Prox Cx lesion is 99% stenosed.  Ost LM to Dist LM lesion is 99% stenosed.  3rd Mrg lesion is 60% stenosed.  LPAV lesion is 90% stenosed.   1.  Severe underlying three-vessel coronary artery disease with patent LIMA to LAD.  Chronically occluded SVG to OM.  Severe in-stent restenosis in left main stent extending into the proximal left circumflex.  In addition, there is significant restenosis in the stent placed in the left posterior AV groove artery.  The RCA is known to be small in size and severely diseased. 2.  Left ventricular angiography was not performed due to chronic kidney disease.  LVEDP was 13 mmHg.  Admitted 01/08/2019 due to acute HF and NSTEMI. Cardiothoracic consult obtained. Had coronary angioplasty of the left main and mid circumflex coronary artery 01/11/2019. Discharged after 3 days. Admitted 01/03/2019 due to Cardiology consult obtained. Initially needed bipap. Chest CTA was negative for PE. Initially given IV lasix and heparin. COVID negative. Transferred after 5 days.   He presents today for his initial visit with a chief complaint of mild, chronic cough. He says that this has been present for several years. He has no associated symptoms and specifically denies any difficulty sleeping, dizziness, abdominal distention, palpitations, pedal edema, chest pain, shortness of breath, fatigue or weight gain.   Past Medical History:  Diagnosis Date  . AICD (automatic  cardioverter/defibrillator) present    Medtronic  . Arthritis    "thumbs"  (08/02/2017)  . CHF (congestive heart failure) ([place])   . Chronic combined systolic and diastolic heart failure ([place])    a. 08/2017 Echo: EF 20-25%, mod glob HK. Sev distal ant sept, inflat HK. Apical AK. Gr2 DD. Mildly reduced RV fxn.  . Colon polyps   . Coronary artery disease    a. s/p CABG x 2 (LIMA->LAD, VG->OM); b. Multiple PCI's to LM/LCX/OM; c. 07/2017 PTCA of LM/LCX w/ early ISR-->repeat PCI/DES to LM (3.5x20 Synergy DES) and LCX (3.0x20 Synergy DES); d. 07/2018 Relook Cath: LM patent stent, LAD 100ost, LCX patent stent, OM1 99/60, LIMA->LAD ok. VG->dLCX 41 (old).  . Depression   . Encounter for assessment of implantable cardioverter-defibrillator (ICD) 09/27/2018  . High cholesterol   . Hypertension   . ICD; Biventricular  Medtronic ICD Amplia MRI QWuad CRT-D  in situ 10/29/14 10/29/2014   Remote ICD check 09.23.20:  One 6 beat NSVT. No therapy.  1 SVT episode @ 130 bpm (38 Sec).  There were 23 Vent sense episodes detected for up to 1.1 min/day (AT). Health trends (patient activity, heart rate variability, average heart rates) are stable.Trans-thoracic impedance trends and the OptiVol Fluid Index do no present significant abnormalities. Battery longevity is 4.3 years. RA pacing is 3  . Ischemic cardiomyopathy 09/27/2018  . MI (myocardial infarction) ([place]) 2003  . NSTEMI (non-ST elevated myocardial infarction) ([place]) 07/16/2014  . OSA on CPAP   . Oxygen deficiency   . Pneumonia 10/2014  . Proteinuria   .  Sleep apnea   . Type II diabetes mellitus (HCC)    insulin dependent   Past Surgical History:  Procedure Laterality Date  . BIOPSY  09/19/2018   Procedure: BIOPSY;  Surgeon: Thornton Park, MD;  [facility]: [facility];  Service: Gastroenterology;;  . CARDIAC CATHETERIZATION N/A 07/16/2014   Procedure: Left Heart Cath and Coronary Angiography;  Surgeon: Adrian Prows, MD;  Location: [place] [facility];  Service:  Cardiovascular;  Laterality: N/A;  . CARDIAC CATHETERIZATION  07/16/2014   Procedure: Coronary Balloon Angioplasty;  Surgeon: Adrian Prows, MD;  Location: [place] [facility];  Service: Cardiovascular;;  . CARDIAC CATHETERIZATION  2003  . CARDIAC DEFIBRILLATOR PLACEMENT  2016  . CATARACT EXTRACTION W/ INTRAOCULAR LENS IMPLANT Left 03/2014  . COLONOSCOPY    . COLONOSCOPY WITH PROPOFOL N/A 09/19/2018   Procedure: COLONOSCOPY WITH PROPOFOL;  Surgeon: Thornton Park, MD;  [facility];  Service: Gastroenterology;  Laterality: N/A;  . CORONARY ANGIOPLASTY WITH STENT PLACEMENT     "I've got a total of 8 stents in there; mostly doine at [facility]" (08/02/2017)  . CORONARY ARTERY BYPASS GRAFT  ~ 2003   "CABG X2"; [facility]  . CORONARY ATHERECTOMY N/A 08/16/2017   Procedure: CORONARY ATHERECTOMY;  Surgeon: Nigel Mormon, MD;  Location: [place] [facility];  Service: Cardiovascular;  Laterality: N/A;  . CORONARY BALLOON ANGIOPLASTY N/A 08/04/2017   Procedure: CORONARY BALLOON ANGIOPLASTY;  Surgeon: Adrian Prows, MD;  Location: [place] [facility];  Service: Cardiovascular;  Laterality: N/A;  . CORONARY BALLOON ANGIOPLASTY N/A 01/11/2019   Procedure: CORONARY BALLOON ANGIOPLASTY;  Surgeon: Adrian Prows, MD;  Location: [place] [facility];  Service: Cardiovascular;  Laterality: N/A;  . CORONARY STENT INTERVENTION N/A 08/16/2017   Procedure: CORONARY STENT INTERVENTION;  Surgeon: Nigel Mormon, MD;  Location: [place] [facility];  Service: Cardiovascular;  Laterality: N/A;  . ELBOW SURGERY Left ?2001   "pinched nerve"  . ENDOSCOPIC MUCOSAL RESECTION  12/18/2018   Procedure: ENDOSCOPIC MUCOSAL RESECTION;  Surgeon: Rush Landmark Telford Nab., MD;  Location: [place];  Service: Gastroenterology;;  . Otho Darner SIGMOIDOSCOPY N/A 12/18/2018   Procedure: Beryle Quant;  Surgeon: Irving Copas., MD;  Location: [place];  Service: Gastroenterology;  Laterality: N/A;  .  HEMOSTASIS CLIP PLACEMENT  12/18/2018   Procedure: HEMOSTASIS CLIP PLACEMENT;  Surgeon: Irving Copas., MD;  Location: [place];  Service: Gastroenterology;;  . LEFT HEART CATH AND CORONARY ANGIOGRAPHY N/A 01/11/2019   Procedure: LEFT HEART CATH AND CORONARY ANGIOGRAPHY;  Surgeon: Adrian Prows, MD;  Location: [place] [facility];  Service: Cardiovascular;  Laterality: N/A;  . LEFT HEART CATH AND CORS/GRAFTS ANGIOGRAPHY N/A 08/04/2017   Procedure: LEFT HEART CATH AND CORS/GRAFTS ANGIOGRAPHY;  Surgeon: Adrian Prows, MD;  Location: [place] [facility];  Service: Cardiovascular;  Laterality: N/A;  . LEFT HEART CATH AND CORS/GRAFTS ANGIOGRAPHY N/A 08/20/2017   Procedure: LEFT HEART CATH AND CORS/GRAFTS ANGIOGRAPHY;  Surgeon: Nigel Mormon, MD;  Location: [place] [facility];  Service: Cardiovascular;  Laterality: N/A;  . LEFT HEART CATH AND CORS/GRAFTS ANGIOGRAPHY N/A 01/08/2019   Procedure: LEFT HEART CATH AND CORS/GRAFTS ANGIOGRAPHY;  Surgeon: Wellington Hampshire, MD;  Location: [place] [facility];  Service: Cardiovascular;  Laterality: N/A;  . POLYPECTOMY  09/19/2018   Procedure: POLYPECTOMY;  Surgeon: Thornton Park, MD;  [facility];  Service: Gastroenterology;;  . Lia Foyer INJECTION  09/19/2018   Procedure: SUBMUCOSAL INJECTION;  Surgeon: Thornton Park, MD;  [facility];  Service:  Gastroenterology;;  . Lia Foyer LIFTING INJECTION  12/18/2018   Procedure: SUBMUCOSAL LIFTING INJECTION;  Surgeon: Irving Copas., MD;  [facility];  Service: Gastroenterology;;   Family History  Problem Relation Age of Onset  . Uterine cancer Mother   . Lung cancer Mother   . Hyperlipidemia Father   . Heart disease Father   . Hypertension Father   . Diabetes Father   . Colon cancer Neg Hx   . Esophageal cancer Neg Hx   . Colon polyps Neg Hx   . Rectal cancer Neg Hx   . Stomach cancer Neg Hx   . Inflammatory bowel disease Neg Hx   . Liver disease  Neg Hx   . Pancreatic cancer Neg Hx    Social History   Tobacco Use  . Smoking status: Former Smoker    Years: 27.00    Types: Cigars    Quit date: 2002    Years since quitting: 19.0  . Smokeless tobacco: Former Systems developer    Quit date: 2002  Substance Use Topics  . Alcohol use: Not Currently   Allergies  Allergen Reactions  . Diltiazem Rash   Prior to Admission medications   Medication Sig Start Date End Date Taking? Authorizing Provider  acetaminophen (TYLENOL) 325 MG tablet Take 2 tablets (650 mg total) by mouth every 4 (four) hours as needed for headache or mild pain. 01/08/19  Yes Nolberto Hanlon, MD  amiodarone (PACERONE) 400 MG tablet Take 0.5 tablets (200 mg total) by mouth daily. 01/11/19  Yes Adrian Prows, MD  aspirin EC 81 MG EC tablet Take 1 tablet (81 mg total) by mouth daily. 01/08/19  Yes Nolberto Hanlon, MD  carvedilol (COREG) 6.25 MG tablet Take 1 tablet (6.25 mg total) by mouth 2 (two) times daily with a meal. 01/08/19  Yes Nolberto Hanlon, MD  cholecalciferol (VITAMIN D) 25 MCG tablet Take 2 tablets (2,000 Units total) by mouth 2 (two) times daily. 01/08/19  Yes Nolberto Hanlon, MD  ezetimibe (ZETIA) 10 MG tablet Take 1 tablet (10 mg total) by mouth at bedtime. 01/08/19  Yes Nolberto Hanlon, MD  furosemide (LASIX) 40 MG tablet Take 40 mg by mouth daily.   Yes [provider]  insulin aspart protamine- aspart (NOVOLOG MIX 70/30) (70-30) 100 UNIT/ML injection Inject 0.25 mLs (25 Units total) into the skin 2 (two) times daily with a meal. Patient taking differently: Inject 63 Units into the skin 2 (two) times daily with a meal.  01/08/19  Yes Nolberto Hanlon, MD  isosorbide mononitrate (IMDUR) 30 MG 24 hr tablet Take 1 tablet (30 mg total) by mouth daily. 01/08/19  Yes Nolberto Hanlon, MD  liraglutide (VICTOZA) 18 MG/3ML SOPN Inject 1.8 mg into the skin daily.   Yes [provider]  nitroGLYCERIN (NITROSTAT) 0.4 MG SL tablet Place 1 tablet (0.4 mg total) under the tongue every 5  (five) minutes x 3 doses as needed for chest pain. 01/08/19  Yes Nolberto Hanlon, MD  Omega-3 Fatty Acids (FISH OIL) 1000 MG CAPS Take 1,000 mg by mouth 2 (two) times daily.    Yes [provider]  potassium chloride SA (KLOR-CON) 20 MEQ tablet Take 1 tablet (20 mEq total) by mouth 2 (two) times daily. 01/08/19  Yes Nolberto Hanlon, MD  PRESCRIPTION MEDICATION Inhale into the lungs at bedtime. CPAP   Yes [provider]  rosuvastatin (CRESTOR) 40 MG tablet Take 1 tablet (40 mg total) by mouth at bedtime. 01/08/19  Yes Nolberto Hanlon, MD  sertraline (  ZOLOFT) 50 MG tablet Take 1 tablet (50 mg total) by mouth at bedtime. 01/08/19  Yes Nolberto Hanlon, MD  ticagrelor (BRILINTA) 90 MG TABS tablet Take 1 tablet (90 mg total) by mouth 2 (two) times daily. 01/08/19  Yes Nolberto Hanlon, MD     Review of Systems  Constitutional: Negative for appetite change and fatigue.  HENT: Negative for congestion, postnasal drip and sore throat.   Eyes: Negative.   Respiratory: Positive for cough (dry cough). Negative for shortness of breath.   Cardiovascular: Negative for chest pain, palpitations and leg swelling.  Gastrointestinal: Negative for abdominal distention and abdominal pain.  Endocrine: Negative.   Genitourinary: Negative.   Musculoskeletal: Negative for back pain and neck pain.  Skin: Negative.   Allergic/Immunologic: Negative.   Neurological: Negative for dizziness and light-headedness.  Hematological: Negative for adenopathy. Does not bruise/bleed easily.  Psychiatric/Behavioral: Negative for dysphoric mood and sleep disturbance (wearing CPAP and oxygen at 2L at bedtime). The patient is not nervous/anxious.    Vitals:   01/23/19 1331  BP: 116/81  Pulse: 79  Resp: 20  SpO2: 98%  Weight: 212 lb (96.2 kg)  Height: 5\' 7"  (1.702 m)   Wt Readings from Last 3 Encounters:  01/23/19 212 lb (96.2 kg)  01/16/19 216 lb 14.4 oz (98.4 kg)  01/11/19 211 lb 8 oz (95.9 kg)   Lab Results   Component Value Date   CREATININE 1.49 (H) 01/11/2019   CREATININE 1.61 (H) 01/09/2019   CREATININE 1.60 (H) 01/08/2019   Physical Exam Vitals and nursing note reviewed.  Constitutional:      Appearance: Normal appearance.  HENT:     Head: Normocephalic and atraumatic.  Cardiovascular:     Rate and Rhythm: Normal rate and regular rhythm.  Pulmonary:     Effort: Pulmonary effort is normal. No respiratory distress.     Breath sounds: No wheezing or rales.  Abdominal:     General: There is no distension.     Palpations: Abdomen is soft.  Musculoskeletal:        General: No tenderness.     Cervical back: Normal range of motion and neck supple.     Right lower leg: No edema.     Left lower leg: No edema.  Skin:    General: Skin is warm and dry.  Neurological:     General: No focal deficit present.     Mental Status: He is alert and oriented to person, place, and time.  Psychiatric:        Mood and Affect: Mood normal.        Behavior: Behavior normal.     Assessment & Plan:  1: Chronic heart failure with reduced ejection fraction- - NYHA class I - euvolemic today - weighing daily and he was reminded to call for an overnight weight gain of >2 pounds or a weekly weight gain of >5 pounds - not adding salt; importance of following a low sodium diet was discussed and written information and a low sodium cookbook were given to him - saw cardiology Einar Gip) 01/16/2019 - BNP 01/03/2019 was 1262.0 - says that he's received his flu vaccine for this season - current BP does not allow the addition of entresto  2: HTN- - BP looks good today - saw PCP Caryl Bis) 01/01/2019 - BMP 01/11/2019 reviewed and showed sodium 134, potassium 4.7, creatinine 1.49 and GFR 49  3: DM-  - A1c 12/27/2018 was 11.6% - fasting glucose in clinic today was 381 but  he says that he hasn't eaten yet so hasn't taken his insulin yet - says that at home after taking his insulin, his glucose is in the  100's  Patient did not bring his medications nor a list. Each medication was verbally reviewed with the patient and he was encouraged to bring the bottles to every visit to confirm accuracy of list.  Return in 2 months or sooner for any questions/problems before then.

## 2019-01-23 ENCOUNTER — Ambulatory Visit: Payer: Medicare Other | Attending: Family | Admitting: Family

## 2019-01-23 ENCOUNTER — Other Ambulatory Visit: Payer: Self-pay

## 2019-01-23 ENCOUNTER — Encounter: Payer: Self-pay | Admitting: Family

## 2019-01-23 VITALS — BP 116/81 | HR 79 | Resp 20 | Ht 67.0 in | Wt 212.0 lb

## 2019-01-23 DIAGNOSIS — I252 Old myocardial infarction: Secondary | ICD-10-CM | POA: Diagnosis not present

## 2019-01-23 DIAGNOSIS — Z23 Encounter for immunization: Secondary | ICD-10-CM | POA: Insufficient documentation

## 2019-01-23 DIAGNOSIS — IMO0002 Reserved for concepts with insufficient information to code with codable children: Secondary | ICD-10-CM

## 2019-01-23 DIAGNOSIS — Z87891 Personal history of nicotine dependence: Secondary | ICD-10-CM | POA: Insufficient documentation

## 2019-01-23 DIAGNOSIS — I130 Hypertensive heart and chronic kidney disease with heart failure and stage 1 through stage 4 chronic kidney disease, or unspecified chronic kidney disease: Secondary | ICD-10-CM | POA: Diagnosis not present

## 2019-01-23 DIAGNOSIS — G4733 Obstructive sleep apnea (adult) (pediatric): Secondary | ICD-10-CM | POA: Diagnosis not present

## 2019-01-23 DIAGNOSIS — M1990 Unspecified osteoarthritis, unspecified site: Secondary | ICD-10-CM | POA: Diagnosis not present

## 2019-01-23 DIAGNOSIS — N183 Chronic kidney disease, stage 3 unspecified: Secondary | ICD-10-CM

## 2019-01-23 DIAGNOSIS — E7800 Pure hypercholesterolemia, unspecified: Secondary | ICD-10-CM | POA: Diagnosis not present

## 2019-01-23 DIAGNOSIS — E1122 Type 2 diabetes mellitus with diabetic chronic kidney disease: Secondary | ICD-10-CM | POA: Diagnosis not present

## 2019-01-23 DIAGNOSIS — Z955 Presence of coronary angioplasty implant and graft: Secondary | ICD-10-CM | POA: Diagnosis not present

## 2019-01-23 DIAGNOSIS — I10 Essential (primary) hypertension: Secondary | ICD-10-CM

## 2019-01-23 DIAGNOSIS — F329 Major depressive disorder, single episode, unspecified: Secondary | ICD-10-CM | POA: Insufficient documentation

## 2019-01-23 DIAGNOSIS — I5042 Chronic combined systolic (congestive) and diastolic (congestive) heart failure: Secondary | ICD-10-CM | POA: Insufficient documentation

## 2019-01-23 DIAGNOSIS — Z8249 Family history of ischemic heart disease and other diseases of the circulatory system: Secondary | ICD-10-CM | POA: Insufficient documentation

## 2019-01-23 DIAGNOSIS — Z801 Family history of malignant neoplasm of trachea, bronchus and lung: Secondary | ICD-10-CM | POA: Diagnosis not present

## 2019-01-23 DIAGNOSIS — I255 Ischemic cardiomyopathy: Secondary | ICD-10-CM | POA: Diagnosis not present

## 2019-01-23 DIAGNOSIS — Z951 Presence of aortocoronary bypass graft: Secondary | ICD-10-CM | POA: Insufficient documentation

## 2019-01-23 DIAGNOSIS — I2510 Atherosclerotic heart disease of native coronary artery without angina pectoris: Secondary | ICD-10-CM | POA: Insufficient documentation

## 2019-01-23 DIAGNOSIS — Z95810 Presence of automatic (implantable) cardiac defibrillator: Secondary | ICD-10-CM | POA: Diagnosis not present

## 2019-01-23 DIAGNOSIS — Z86010 Personal history of colonic polyps: Secondary | ICD-10-CM | POA: Diagnosis not present

## 2019-01-23 DIAGNOSIS — Z79899 Other long term (current) drug therapy: Secondary | ICD-10-CM | POA: Diagnosis not present

## 2019-01-23 DIAGNOSIS — Z833 Family history of diabetes mellitus: Secondary | ICD-10-CM | POA: Insufficient documentation

## 2019-01-23 DIAGNOSIS — Z7982 Long term (current) use of aspirin: Secondary | ICD-10-CM | POA: Insufficient documentation

## 2019-01-23 DIAGNOSIS — N189 Chronic kidney disease, unspecified: Secondary | ICD-10-CM | POA: Diagnosis present

## 2019-01-23 DIAGNOSIS — Z8049 Family history of malignant neoplasm of other genital organs: Secondary | ICD-10-CM | POA: Insufficient documentation

## 2019-01-23 DIAGNOSIS — Z794 Long term (current) use of insulin: Secondary | ICD-10-CM | POA: Diagnosis not present

## 2019-01-23 DIAGNOSIS — E785 Hyperlipidemia, unspecified: Secondary | ICD-10-CM | POA: Insufficient documentation

## 2019-01-23 DIAGNOSIS — Z8349 Family history of other endocrine, nutritional and metabolic diseases: Secondary | ICD-10-CM | POA: Insufficient documentation

## 2019-01-23 LAB — GLUCOSE, CAPILLARY: Glucose-Capillary: 370 mg/dL — ABNORMAL HIGH (ref 70–99)

## 2019-01-23 NOTE — Patient Instructions (Signed)
Continue weighing daily and call for an overnight weight gain of > 2 pounds or a weekly weight gain of >5 pounds. 

## 2019-01-24 ENCOUNTER — Ambulatory Visit: Payer: No Typology Code available for payment source | Admitting: Cardiology

## 2019-01-30 ENCOUNTER — Encounter: Payer: Self-pay | Admitting: *Deleted

## 2019-01-30 ENCOUNTER — Other Ambulatory Visit: Payer: Self-pay | Admitting: *Deleted

## 2019-01-30 NOTE — Patient Outreach (Signed)
[facility]) [place] Telephone Outreach PCP office completes Transition of Care follow up post-hospital discharge Post-hospital discharge day # 19  01/30/2019  Martin Bush May 26, 1955 [id number]  Successful telephone outreach to Martin Bush, 64 y/o male referred 01/12/2019 to [place] by [facility] Liaison after recent hospitalization December 9- 17, 2020 for acute pulmonary edema/ NSTEMI with native vessel disease and stent re-stenosis; patient had cardiac catherization 01/08/2019 and eventual PCI/ angioplasty.  Patient was discharged home to self care without home health services in place.  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  HIPAA/ identity verified with patient; patient reports "doing well," and he denies pain and new/ recent falls.  Patient sounds to be in no distress throughout call today.  Patient further reports:  -- no medication concerns; no recent changes to medications; continues self-managing medications; patient was recently discharged from hospital and all medications were reviewed with patient today; several medications patient reported taking were noton EMR (Entresto/ jardiance/ metformin) so medication list was updated according to patient report- no concerns identified ---- noted from review of EMR that patient was recently prescribed NTG SL-- education around safe use of NTG was reiterated/ provided   Medications Reviewed Today    Reviewed by Knox Royalty, RN (Registered Nurse) on 01/30/19 at [place] List Status: <None>  Medication Order Taking? Sig Documenting Provider Last Dose Status Informant  acetaminophen (TYLENOL) 325 MG tablet [id number]  Take 2 tablets (650 mg total) by mouth every 4 (four) hours as needed for headache or mild pain. Nolberto Hanlon, MD  Active Self  amiodarone (PACERONE) 400 MG  tablet [id number]  Take 0.5 tablets (200 mg total) by mouth daily. Adrian Prows, MD  Active   aspirin EC 81 MG EC tablet [id number]  Take 1 tablet (81 mg total) by mouth daily. Nolberto Hanlon, MD  Active Self  carvedilol (COREG) 6.25 MG tablet [id number]  Take 1 tablet (6.25 mg total) by mouth 2 (two) times daily with a meal. Nolberto Hanlon, MD  Active Self  cholecalciferol (VITAMIN D) 25 MCG tablet [id number]  Take 2 tablets (2,000 Units total) by mouth 2 (two) times daily. Nolberto Hanlon, MD  Active Self  ezetimibe (ZETIA) 10 MG tablet [id number]  Take 1 tablet (10 mg total) by mouth at bedtime. Nolberto Hanlon, MD  Active Self  furosemide (LASIX) 40 MG tablet [id number]  Take 40 mg by mouth daily. [provider]  Active Self  insulin aspart protamine- aspart (NOVOLOG MIX 70/30) (70-30) 100 UNIT/ML injection [id number]  Inject 0.25 mLs (25 Units total) into the skin 2 (two) times daily with a meal.  Patient taking differently: Inject 63 Units into the skin 2 (two) times daily with a meal.    Nolberto Hanlon, MD  Active Self  isosorbide mononitrate (IMDUR) 30 MG 24 hr tablet [id number]  Take 1 tablet (30 mg total) by mouth daily. Nolberto Hanlon, MD  Active Self  liraglutide (VICTOZA) 18 MG/3ML SOPN [id number]  Inject 1.8 mg into the skin daily. [provider]  Active Self  nitroGLYCERIN (NITROSTAT) 0.4 MG SL tablet [id number]  Place 1 tablet (0.4 mg total) under the tongue every 5 (five) minutes x 3 doses as needed for chest pain. Nolberto Hanlon, MD  Active Self  Omega-3 Fatty Acids (FISH OIL) 1000 MG CAPS [id number]  Take 1,000 mg by  mouth 2 (two) times daily.  [provider]  Active Self  potassium chloride SA (KLOR-CON) 20 MEQ tablet [id number]  Take 1 tablet (20 mEq total) by mouth 2 (two) times daily. Nolberto Hanlon, MD  Active Self  PRESCRIPTION MEDICATION [id number]  Inhale into the lungs at bedtime. CPAP [provider]  Active Self  rosuvastatin (CRESTOR) 40 MG tablet [id number]  Take 1  tablet (40 mg total) by mouth at bedtime. Nolberto Hanlon, MD  Active Self  sertraline (ZOLOFT) 50 MG tablet [id number]  Take 1 tablet (50 mg total) by mouth at bedtime. Nolberto Hanlon, MD  Active Self  ticagrelor (BRILINTA) 90 MG TABS tablet [id number]  Take 1 tablet (90 mg total) by mouth 2 (two) times daily. Nolberto Hanlon, MD  Active Self         -- recent and upcoming provider appointments were reviewed with patient; he reports that he has attended all as scheduled; patient continues driving self to appointments, or has his wife drive him ---- next cardiology appointment scheduled February 21, 2019 ---- encouraged patient to promptly notify care providers for development of any new concerns/ issues/ problems   Self-health management of CHF/ CAD/ DM: -- continues using home O2 at 2 L/min "mainly at night only;" also uses with CPAP; has not needed to use home O2 except at night, post-hospital discharge per patient report -- has resumed daily weight monitoring/ recording at home; reviewed with patient daily recorded weights and he reports weights consistently between 210-211 pounds; patient confirms that he received previously mailed printed educational material around weight gain guidelines and corresponding action plan in setting of CHF; discussed that weight gain is early indicator of fluid retention and also discussed late signs- SOB; cough; peripheral edema, as signs/ symptoms yellow CHF zone; using teach back method, provided education around action plan for development of signs/ symptoms yellow CHF zone -- continues monitoring/ recording blood sugars at home and denies issues around blood sugars, again reports "they are always in the 200-range; " reports today that PCP manages DM  Patient denies further issues, concerns, or problems today. I provided/ confirmed that patient hasmy direct phone number, the main [facility] CM office phone number, and the [facility] CM 24-hour nurse advice phone number should  issues arise prior to next scheduled [place] outreach.  Encouraged patient to contact me directly if needs, questions, issues, or concerns arise prior to next scheduled outreach; patient agreed to do so.  Plan:  Patient will take medications as prescribed and will attend all scheduled provider appointments  Patient will promptly notify care providers for any new concerns/ issues/ problems that arise  Patient will contiue monitoring/ recording daily weights at home and will follow weight gain guidelines and corresponding action plan for weight gain  I will share today's [facility] RN CM note/ medication list/ care plan with patient's PCP as [place] RN CM initial assessment  [place] outreach to continue with scheduled phone call in 2 weeks for completion of medication review and hopefully [facility] CM initial assessment  [facility] CM Care Plan Problem One     Most Recent Value  Care Plan Problem One  High Risk for hospital readmission, related to/ as evidenced by recent hospital admission for NSTEMI/ with PCI  Role Documenting the Problem One  Care Management Coordinator  Care Plan for Problem One  Active  [facility] Long Term Goal   Over the next 31 days, patient will not experience hospital readmission as evidenced by  patient reporting and review of EMR during [facility] RN CM outreach  [facility] Long Term Goal Start Date  01/15/19  Interventions for Problem One Long Term Goal  Discussed with patient his current clinical condition and confirmed that he does not have any current clinical concerns,  medication review completed and medication list in EMR updated accordingly,  sent patient's PCP [facility] RN CM initial assessment with care plan and updated medication list  [facility] CM Short Term Goal #1   Over the next 30 days, patient will resume monitoring and recording daily weights at home and will follow established action plan for weight gain, as evidenced by patient reporting during [facility] RN CM outreach  [facility] CM  Short Term Goal #1 Start Date  01/15/19  Interventions for Short Term Goal #1  Reviewed with patient daily weights at home and confirmed that he has resumed daily weight monitoring,  initiated education around weight gain guidelines in setting of CHF, along with other signs/ symptoms yellow CHF zone,  confirmed that patient has no concerns aorund shortness of breath, weight gain, peripheral swelling,  discussed action plan for yellow CHF zone  [facility] CM Short Term Goal #2   Over the next 30 days, patient will attend all scheduled provider appointments as evidenced by patient reporting and review of EMR with collaboration with care providers as indicated during [facility] RN CM outreach  [facility] CM Short Term Goal #2 Start Date  01/15/19  Interventions for Short Term Goal #2  Reviewed recent and upcoming provider appointments with patient and confirmed that he has attended all recent scheduled appointments,  initiated education around newly prescribed SL NTG by cardiology provider     Oneta Rack, RN, BSN, [place] Coordinator [facility] Care Management  [phone]

## 2019-02-07 DIAGNOSIS — I5043 Acute on chronic combined systolic (congestive) and diastolic (congestive) heart failure: Secondary | ICD-10-CM | POA: Diagnosis not present

## 2019-02-08 ENCOUNTER — Telehealth: Payer: Self-pay | Admitting: Family Medicine

## 2019-02-08 DIAGNOSIS — E1165 Type 2 diabetes mellitus with hyperglycemia: Secondary | ICD-10-CM | POA: Diagnosis not present

## 2019-02-08 DIAGNOSIS — E1121 Type 2 diabetes mellitus with diabetic nephropathy: Secondary | ICD-10-CM | POA: Diagnosis not present

## 2019-02-08 DIAGNOSIS — E782 Mixed hyperlipidemia: Secondary | ICD-10-CM | POA: Diagnosis not present

## 2019-02-08 DIAGNOSIS — I10 Essential (primary) hypertension: Secondary | ICD-10-CM | POA: Diagnosis not present

## 2019-02-08 NOTE — Telephone Encounter (Signed)
Pt wife called about pt possibly having vertigo. No appt avail. I transferred pt to [place] on Access Nurse line.

## 2019-02-14 ENCOUNTER — Encounter: Payer: Self-pay | Admitting: *Deleted

## 2019-02-14 ENCOUNTER — Other Ambulatory Visit: Payer: Self-pay | Admitting: *Deleted

## 2019-02-14 NOTE — Patient Outreach (Signed)
[facility]) [place] Telephone Outreach PCP office completes Transition of Care follow up post- hospital discharge Post-hospital discharge day # 34 Unsuccessful (consecutive) outreach attempt # 1- previously engaged patient  02/14/2019  Martin Bush Apr 20, 1955 [id number]  Unsuccessful telephone outreach to Martin Bush, 64 y/o male referred 01/12/2019 to [place] by [facility] Liaison after recent hospitalization December 9- 17, 2020 for acute pulmonary edema/ NSTEMI with native vessel disease and stent re-stenosis; patient had cardiac catherization 01/08/2019 and eventual PCI/ angioplasty.Patient was discharged home to self care withouthome health services in place. 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  HIPAA compliant voice mail message left for patient, requesting return call back.  Plan:  Will re-attempt [place] telephone outreach within 4 business days if I do not hear back from patient first.  Oneta Rack, RN, BSN, [place] Coordinator [facility] Care Management  [phone]

## 2019-02-20 ENCOUNTER — Other Ambulatory Visit: Payer: Self-pay | Admitting: *Deleted

## 2019-02-20 ENCOUNTER — Encounter: Payer: Self-pay | Admitting: *Deleted

## 2019-02-20 NOTE — Patient Outreach (Signed)
[facility]) [name] Telephone Outreach PCP office completes Transition of Care follow up post-hospital discharge Post-hospital discharge day # 40 without unplanned hospital re-admission 02/20/2019  Early Steel [birth year] [id number]  Successful telephone outreach to Martin Bush, 64 y/o male referred 01/12/2019 to [place] by [facility] Liaison after recent hospitalization December 9- 17, 2020 for acute pulmonary edema/ NSTEMI with native vessel disease and stent re-stenosis; patient had cardiac catherization 01/08/2019 and eventual PCI/ angioplasty.Patient was discharged home to self care withouthome health services in place. 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  HIPAA/ identity verified with patient; patient reports "doing well," and he denies pain and new/ recent falls.  Patient sounds to be in no distress throughout call today.  Patient further reports:  -- no medication concerns; no recent changes to medications; continues self-managing medications; patient able to verbalize accurate understanding of current insulin doses. -- reviewed with patient recent/ upcoming provider appointments;  verbalizes accurate understanding of upcoming appointments and plans to attend all- continues driving self; wife can assist if indicated ---- cardiology hospital follow up appointment tomorrow ---- March 15, 2019: virtual appointment with PCP (through [place])  Self-health management ofCHF/ CAD/ DM: -- continues using home O2 at 2 L/min "at night only;" also uses with CPAP; has not needed to use home O2 except at night, post-hospital discharge per patient report; adherent to CPAP- denies problems with either -- has continued daily weight monitoring/ recording at home; reviewed with patient daily recorded weights and he reports  weights consistently between 205-206 pounds; patient reports weight this morning of "206 lbs;"  Using teach back method, discussed/ reviewed with patient signs/ symptoms yellow CHF zones, and patient is able to accurately verbalize along with corresponding action plan -- continues monitoring/ recording blood sugars at home and denies issues around blood sugars, reports "they are starting to get a little lower," adding that his endocrinologist at the [facility] manages his diabetes, and that he has been attempting to become more adherent to following dietary strategies for self- health management of DM; reports "takes medicines the way (he) is supposed to," which he believes is the main reason his sugars at home have been lower lately.  Reports blood sugar last night HS at "126," and states that morning fasting blood sugars are "lower than 200." ---- endorses ongoing adherence to low salt heart healthy/ low carb diet; discussed general daily carb intake guidelines with patient and several easy strategies for adherence to limiting carbs: use of fiber in diet; eliminating sugary drinks, etc ---- discussed significance of A1-C values- patient verbalizes a good general understanding of A1-C significance; reviewed personal A1-C trends over last year with patient and discussed recommended A1-C goals ---- offered to place printed educational material in mail to patient around dietary strategies to decrease A1-C/ blood sugars, however, patient reports he "already has a bunch of information at home," and he declines, stating he does not need additional information/ education  Patient denies further issues, concerns, or problems today. Iprovided/confirmed that patient hasmy direct phone number, the [facility] CM office phone number, and the [facility] CM 24-hour nurse advice phone number should issues arise prior to next scheduled [place] outreach. Encouraged patient to contact me directly if needs, questions, issues, or  concerns arise prior to next scheduled outreach; patient agreed to do so.  Plan:  Patient will take medications as prescribed and will attend all scheduled provider appointments  Patient will promptly notify care providers for any new concerns/ issues/ problems that arise  Patient willcontiuemonitoring/ recording daily weights at home and will follow weight gain guidelines and corresponding action plan for weight gain  Patient will continue following heart healthy and diabetic diet  Patient will continue monitoring and recording blood sugars at home  [place] outreach to continue with scheduled phone callnext month  [facility] CM Care Plan Problem One     Most Recent Value  Care Plan Problem One  High Risk for hospital readmission, related to/ as evidenced by recent hospital admission for NSTEMI/ with PCI  Role Documenting the Problem One  Care Management Coordinator  Care Plan for Problem One  Not Active  [facility] Long Term Goal   Over the next 31 days, patient will not experience hospital readmission as evidenced by patient reporting and review of EMR during [facility] CM outreach  [facility] Long Term Goal Start Date  01/15/19  [facility] Long Term Goal Met Date  02/20/19 [facility] met]  Interventions for Problem One Long Term Goal  Confirmed that patient has not had any hospital re-admissions,  discussed current clinical condition with patient and confirmed that he has no current concerns  [facility] CM Short Term Goal #1   Over the next 30 days, patient will resume monitoring and recording daily weights at home and will follow established action plan for weight gain, as evidenced by patient reporting during [facility] RN CM outreach  [facility] CM Short Term Goal #1 Start Date  01/15/19  [facility] CM Short Term Goal #1 Met Date  02/20/19 [Goal Met]  Interventions for Short Term Goal #1  Confirmed that patient has continues monitoring and recording daily weights at home and is able to verbalize weight gain guidelines in setting  of CHF,  confirmed that patient is able to verbalize other signs/ symptoms yellow CHF zone, along with appropriate action plan  [facility] CM Short Term Goal #2   Over the next 30 days, patient will attend all scheduled provider appointments as evidenced by patient reporting and review of EMR with collaboration with care providers as indicated during [facility] RN CM outreach  [facility] CM Short Term Goal #2 Start Date  01/15/19  [facility] CM Short Term Goal #2 Met Date  02/20/19 [Goal Met]  Interventions for Short Term Goal #2  Reviewed recent and upcoming provider appointments with patient and cofirmed that he is aware of all and has plans to attend all,  confirmed that patient continues to drive self to appointments and has no transportation concerns    [facility] CM Care Plan Problem Two     Most Recent Value  Care Plan Problem Two  Ongoing reinforcement of self-health management of chronic disease states of CHF and Diabetes, as evidenced by patient reporting   Role Documenting the Problem Two  Care Management Coordinator  Care Plan for Problem Two  Active  Interventions for Problem Two Long Term Goal   Reviewed with patient recent daily weights and blood sugars at home,  offered to provide patient with printed educational material around dietary self-health management of DM, however, patient declined.  Using teach back method, reviewed with patient signs/ symptoms yellow CHF zone along with corresponding action plan,  simple dietary strategies to lower blood sugar,  significance of A1-C value.  Reviewed trends of patient's A1-C values over last year and discussed general carbohydrate recommendations for DM diet.  Confirmed that patient has had no recent changes to medicatins and continues taking all as prescribed,  confirmed that patient continues using home O2 and CPAP at night  [facility] Long Term Goal  Over the next 60 days, patient will continue monitoring and recording daily weights and blood sugars at home, as evidenced by patient  reporting and review of same during [facility] RN CM outreach  [place] Term Goal Start Date  02/20/19     Oneta Rack, RN, BSN, [place] Coordinator [facility] Care Management  [phone]

## 2019-02-21 ENCOUNTER — Ambulatory Visit (INDEPENDENT_AMBULATORY_CARE_PROVIDER_SITE_OTHER): Payer: No Typology Code available for payment source | Admitting: Cardiology

## 2019-02-21 ENCOUNTER — Other Ambulatory Visit: Payer: Self-pay

## 2019-02-21 ENCOUNTER — Encounter: Payer: Self-pay | Admitting: Cardiology

## 2019-02-21 VITALS — BP 117/70 | HR 82 | Temp 97.6°F | Ht 67.0 in | Wt 214.0 lb

## 2019-02-21 DIAGNOSIS — I2510 Atherosclerotic heart disease of native coronary artery without angina pectoris: Secondary | ICD-10-CM

## 2019-02-21 DIAGNOSIS — I129 Hypertensive chronic kidney disease with stage 1 through stage 4 chronic kidney disease, or unspecified chronic kidney disease: Secondary | ICD-10-CM

## 2019-02-21 DIAGNOSIS — IMO0002 Reserved for concepts with insufficient information to code with codable children: Secondary | ICD-10-CM

## 2019-02-21 DIAGNOSIS — I25810 Atherosclerosis of coronary artery bypass graft(s) without angina pectoris: Secondary | ICD-10-CM | POA: Diagnosis not present

## 2019-02-21 DIAGNOSIS — I5042 Chronic combined systolic (congestive) and diastolic (congestive) heart failure: Secondary | ICD-10-CM

## 2019-02-21 DIAGNOSIS — Z4502 Encounter for adjustment and management of automatic implantable cardiac defibrillator: Secondary | ICD-10-CM

## 2019-02-21 DIAGNOSIS — E1122 Type 2 diabetes mellitus with diabetic chronic kidney disease: Secondary | ICD-10-CM

## 2019-02-21 DIAGNOSIS — E1165 Type 2 diabetes mellitus with hyperglycemia: Secondary | ICD-10-CM

## 2019-02-21 DIAGNOSIS — Z95810 Presence of automatic (implantable) cardiac defibrillator: Secondary | ICD-10-CM

## 2019-02-21 DIAGNOSIS — N183 Chronic kidney disease, stage 3 unspecified: Secondary | ICD-10-CM

## 2019-02-21 NOTE — Progress Notes (Signed)
Primary Physician/Referring:  [name], MD  Patient ID: Martin Bush, male    DOB: 1955/09/21, 64 y.o.   MRN: [id number]  Chief Complaint  Patient presents with  . Congestive Heart Failure  . Coronary Artery Disease  . Follow-up   HPI:    Martin Bush  is a 64 y.o.  Caucasian male  with CAD and history of CABG x2 in 2002 with only LIMA to LAD being patent, Circumflex coronary artery is super dominant. H/O  Left main stenting in 2015 and in 2016 he had complex PCI to in-stent restenotic circumflex/OM bifurcation, due to recurrent restenosis, underwent stenting to left main and circumflex proximal and midsegment with implantation of 2 overlapping 3.5 x 20 and 3.0 x 20 mm  Synergy DES on 08/16/2017 when he presented with decompensated heart failure and recurrent VT.  Similar presentation on 01/03/2019, coronary angiography on 01/05/2019 revealing severely restenotic left main stent that extends into the circumflex proximal and also old mid circumflex stent and successful Wolverine 3.5 mm cutting balloon angioplasty on 01/11/19. He also had slow VT but stable hemodynamics at presentation. He is on chronic amiodarone.   He is feeling well and has not had any further dyspnea, palpitations or chest pain. Presents for a 4 week OV and is tolerating all his medications and anti platelet therapy without complications. DM continues to be uncontrolled.   Past Medical History:  Diagnosis Date  . AICD (automatic cardioverter/defibrillator) present    Medtronic  . Arthritis    "thumbs"  (08/02/2017)  . CHF (congestive heart failure) ([place])   . Chronic combined systolic and diastolic heart failure ([place])    a. 08/2017 Echo: EF 20-25%, mod glob HK. Sev distal ant sept, inflat HK. Apical AK. Gr2 DD. Mildly reduced RV fxn.  . Colon polyps   . Coronary artery disease    a. s/p CABG x 2 (LIMA->LAD, VG->OM); b. Multiple PCI's to LM/LCX/OM; c. 07/2017 PTCA of LM/LCX w/ early ISR-->repeat PCI/DES to LM  (3.5x20 Synergy DES) and LCX (3.0x20 Synergy DES); d. 07/2018 Relook Cath: LM patent stent, LAD 100ost, LCX patent stent, OM1 99/60, LIMA->LAD ok. VG->dLCX 3 (old).  . Depression   . Encounter for assessment of implantable cardioverter-defibrillator (ICD) 09/27/2018  . High cholesterol   . Hypertension   . ICD; Biventricular  Medtronic ICD Amplia MRI QWuad CRT-D  in situ 10/29/14 10/29/2014   Remote ICD check 09.23.20:  One 6 beat NSVT. No therapy.  1 SVT episode @ 130 bpm (38 Sec).  There were 23 Vent sense episodes detected for up to 1.1 min/day (AT). Health trends (patient activity, heart rate variability, average heart rates) are stable.Trans-thoracic impedance trends and the OptiVol Fluid Index do no present significant abnormalities. Battery longevity is 4.3 years. RA pacing is 3  . Ischemic cardiomyopathy 09/27/2018  . MI (myocardial infarction) ([place]) 2003  . NSTEMI (non-ST elevated myocardial infarction) ([place]) 07/16/2014  . OSA on CPAP   . Oxygen deficiency   . Pneumonia 10/2014  . Proteinuria   . Sleep apnea   . Type II diabetes mellitus (HCC)    insulin dependent   Past Surgical History:  Procedure Laterality Date  . BIOPSY  09/19/2018   Procedure: BIOPSY;  Surgeon: Thornton Park, MD;  Location: [facility];  Service: Gastroenterology;;  . CARDIAC CATHETERIZATION N/A 07/16/2014   Procedure: Left Heart Cath and Coronary Angiography;  Surgeon: Adrian Prows, MD;  Location: [place] [facility];  Service: Cardiovascular;  Laterality: N/A;  .  CARDIAC CATHETERIZATION  07/16/2014   Procedure: Coronary Balloon Angioplasty;  Surgeon: Adrian Prows, MD;  Location: [place] [facility];  Service: Cardiovascular;;  . CARDIAC CATHETERIZATION  2003  . CARDIAC DEFIBRILLATOR PLACEMENT  2016  . CATARACT EXTRACTION W/ INTRAOCULAR LENS IMPLANT Left 03/2014  . COLONOSCOPY    . COLONOSCOPY WITH PROPOFOL N/A 09/19/2018   Procedure: COLONOSCOPY WITH PROPOFOL;  Surgeon: Thornton Park, MD;  Location: [facility];  Service: Gastroenterology;  Laterality: N/A;  . CORONARY ANGIOPLASTY WITH STENT PLACEMENT     "I've got a total of 8 stents in there; mostly doine at [facility]" (08/02/2017)  . CORONARY ARTERY BYPASS GRAFT  ~ 2003   "CABG X2"; [facility]  . CORONARY ATHERECTOMY N/A 08/16/2017   Procedure: CORONARY ATHERECTOMY;  Surgeon: Nigel Mormon, MD;  Location: [place] [facility];  Service: Cardiovascular;  Laterality: N/A;  . CORONARY BALLOON ANGIOPLASTY N/A 08/04/2017   Procedure: CORONARY BALLOON ANGIOPLASTY;  Surgeon: Adrian Prows, MD;  Location: [place] [facility];  Service: Cardiovascular;  Laterality: N/A;  . CORONARY BALLOON ANGIOPLASTY N/A 01/11/2019   Procedure: CORONARY BALLOON ANGIOPLASTY;  Surgeon: Adrian Prows, MD;  Location: [place] [facility];  Service: Cardiovascular;  Laterality: N/A;  . CORONARY STENT INTERVENTION N/A 08/16/2017   Procedure: CORONARY STENT INTERVENTION;  Surgeon: Nigel Mormon, MD;  Location: [place] [facility];  Service: Cardiovascular;  Laterality: N/A;  . ELBOW SURGERY Left ?2001   "pinched nerve"  . ENDOSCOPIC MUCOSAL RESECTION  12/18/2018   Procedure: ENDOSCOPIC MUCOSAL RESECTION;  Surgeon: Rush Landmark Telford Nab., MD;  Location: [place];  Service: Gastroenterology;;  . Otho Darner SIGMOIDOSCOPY N/A 12/18/2018   Procedure: Beryle Quant;  Surgeon: Irving Copas., MD;  Location: [place];  Service: Gastroenterology;  Laterality: N/A;  . HEMOSTASIS CLIP PLACEMENT  12/18/2018   Procedure: HEMOSTASIS CLIP PLACEMENT;  Surgeon: Irving Copas., MD;  Location: [place];  Service: Gastroenterology;;  . LEFT HEART CATH AND CORONARY ANGIOGRAPHY N/A 01/11/2019   Procedure: LEFT HEART CATH AND CORONARY ANGIOGRAPHY;  Surgeon: Adrian Prows, MD;  Location: [place] [facility];  Service: Cardiovascular;  Laterality: N/A;  . LEFT HEART CATH AND CORS/GRAFTS ANGIOGRAPHY N/A 08/04/2017   Procedure: LEFT HEART CATH AND  CORS/GRAFTS ANGIOGRAPHY;  Surgeon: Adrian Prows, MD;  Location: [place] [facility];  Service: Cardiovascular;  Laterality: N/A;  . LEFT HEART CATH AND CORS/GRAFTS ANGIOGRAPHY N/A 08/20/2017   Procedure: LEFT HEART CATH AND CORS/GRAFTS ANGIOGRAPHY;  Surgeon: Nigel Mormon, MD;  Location: [place] [facility];  Service: Cardiovascular;  Laterality: N/A;  . LEFT HEART CATH AND CORS/GRAFTS ANGIOGRAPHY N/A 01/08/2019   Procedure: LEFT HEART CATH AND CORS/GRAFTS ANGIOGRAPHY;  Surgeon: Wellington Hampshire, MD;  Location: [place] [facility];  Service: Cardiovascular;  Laterality: N/A;  . POLYPECTOMY  09/19/2018   Procedure: POLYPECTOMY;  Surgeon: Thornton Park, MD;  Location: [facility];  Service: Gastroenterology;;  . Lia Foyer INJECTION  09/19/2018   Procedure: SUBMUCOSAL INJECTION;  Surgeon: Thornton Park, MD;  Location: [facility];  Service: Gastroenterology;;  . Lia Foyer LIFTING INJECTION  12/18/2018   Procedure: SUBMUCOSAL LIFTING INJECTION;  Surgeon: Irving Copas., MD;  Location: [facility];  Service: Gastroenterology;;   Social History   Socioeconomic History  . Marital status: Married    Spouse name: Not on file  . Number of children: 3  . Years of education: Not on file  . Highest education level: Not on file  Occupational History  . Not on file  Tobacco Use  . Smoking status: Former Smoker    Packs/day: 0.25    Years: 27.00    Pack years: 6.75    Types: Cigars    Quit date: 2002    Years since quitting: 19.0  . Smokeless tobacco: Former Systems developer    Quit date: 2002  Substance and Sexual Activity  . Alcohol use: Not Currently  . Drug use: Never  . Sexual activity: Not Currently  Other Topics Concern  . Not on file  Social History Narrative  . Not on file   Social Determinants of Health   Financial Resource Strain: Low Risk   . Difficulty of Paying Living Expenses: Not hard at all  Food Insecurity: No Food Insecurity  . Worried About Sales executive in the Last Year: Never true  . Ran Out of Food in the Last Year: Never true  Transportation Needs: No Transportation Needs  . Lack of Transportation (Medical): No  . Lack of Transportation (Non-Medical): No  Physical Activity:   . Days of Exercise per Week: Not on file  . Minutes of Exercise per Session: Not on file  Stress:   . Feeling of Stress : Not on file  Social Connections:   . Frequency of Communication with Friends and Family: Not on file  . Frequency of Social Gatherings with Friends and Family: Not on file  . Attends Religious Services: Not on file  . Active Member of Clubs or Organizations: Not on file  . Attends Archivist Meetings: Not on file  . Marital Status: Not on file  Intimate Partner Violence:   . Fear of Current or Ex-Partner: Not on file  . Emotionally Abused: Not on file  . Physically Abused: Not on file  . Sexually Abused: Not on file   ROS  Review of Systems  Constitution: Negative for decreased appetite, malaise/fatigue, weight gain and weight loss.  Eyes: Negative for visual disturbance.  Cardiovascular: Positive for dyspnea on exertion. Negative for chest pain, claudication, leg swelling, orthopnea, palpitations and syncope.  Respiratory: Negative for hemoptysis and wheezing.   Endocrine: Negative for cold intolerance and heat intolerance.  Hematologic/Lymphatic: Does not bruise/bleed easily.  Skin: Negative for nail changes.  Musculoskeletal: Negative for muscle weakness and myalgias.  Gastrointestinal: Negative for abdominal pain, change in bowel habit, nausea and vomiting.  Neurological: Negative for difficulty with concentration, dizziness, focal weakness and headaches.  Psychiatric/Behavioral: Negative for altered mental status and suicidal ideas.  All other systems reviewed and are negative.  Objective  Blood pressure 117/70, pulse 82, temperature 97.6 F (36.4 C), height 5\' 7"  (1.702 m), weight 214 lb (97.1 kg), SpO2 95 %.    Vitals with BMI 02/21/2019 01/23/2019 01/16/2019  Height 5\' 7"  5\' 7"  5\' 7"   Weight 214 lbs 212 lbs 216 lbs 14 oz  BMI 33.51 Q000111Q Q000111Q  Systolic 123XX123 [id number]  Diastolic 70 81 61  Pulse 82 79 75     Physical Exam  Constitutional: He is oriented to person, place, and time. Vital signs are normal. He does not appear ill.  He is moderately built and mildly obese in no acute distress.  HENT:  Head: Normocephalic and atraumatic.  Cardiovascular: Normal rate, regular rhythm, normal heart sounds and intact distal pulses.  Pulses:      Femoral pulses are 2+ on the right side and 2+ on the left side.      Popliteal pulses are 1+ on the right side and 1+ on the  left side.       Dorsalis pedis pulses are 0 on the right side and 0 on the left side.       Posterior tibial pulses are 0 on the right side and 0 on the left side.  No leg edema, no JVD.   Pulmonary/Chest: Effort normal and breath sounds normal. No accessory muscle usage. No respiratory distress.  Abdominal: Soft. Bowel sounds are normal. A hernia is present. Hernia confirmed positive in the ventral area (reducible).  obese  Musculoskeletal:        General: Normal range of motion.     Cervical back: Normal range of motion and neck supple.  Neurological: He is alert and oriented to person, place, and time.  Skin: Skin is warm and dry.  Vitals reviewed.  Laboratory examination:   Recent Labs    01/07/19 0443 01/07/19 0443 01/08/19 2314 01/09/19 0546 01/11/19 0604  NA 133*  --   --  136 134*  K 4.5  --   --  4.7 4.7  CL 101  --   --  104 103  CO2 23  --   --  23 23  GLUCOSE 164*  --   --  125* 120*  BUN 45*  --   --  32* 29*  CREATININE 1.50*   < > 1.60* 1.61* 1.49*  CALCIUM 9.0  --   --  9.5 8.8*  GFRNONAA 49*   < > 45* 45* 49*  GFRAA 57*   < > 52* 52* 57*   < > = values in this interval not displayed.   CrCl cannot be calculated (Patient's most recent lab result is older than the maximum 21 days allowed.).  CMP Latest  Ref Rng & Units 01/11/2019 01/09/2019 01/08/2019  Glucose 70 - 99 mg/dL 120(H) 125(H) -  BUN 8 - 23 mg/dL 29(H) 32(H) -  Creatinine 0.61 - 1.24 mg/dL 1.49(H) 1.61(H) 1.60(H)  Sodium 135 - 145 mmol/L 134(L) 136 -  Potassium 3.5 - 5.1 mmol/L 4.7 4.7 -  Chloride 98 - 111 mmol/L 103 104 -  CO2 22 - 32 mmol/L 23 23 -  Calcium 8.9 - 10.3 mg/dL 8.8(L) 9.5 -  Total Protein 6.5 - 8.1 g/dL - - -  Total Bilirubin 0.3 - 1.2 mg/dL - - -  Alkaline Phos 38 - 126 U/L - - -  AST 15 - 41 U/L - - -  ALT 0 - 44 U/L - - -   CBC Latest Ref Rng & Units 01/11/2019 01/08/2019 01/08/2019  WBC 4.0 - 10.5 K/uL 7.9 7.5 8.0  Hemoglobin 13.0 - 17.0 g/dL 11.3(L) 12.6(L) 12.6(L)  Hematocrit 39.0 - 52.0 % 33.2(L) 37.9(L) 37.4(L)  Platelets 150 - 400 K/uL 181 198 200   Lipid Panel     Component Value Date/Time   CHOL 142 01/04/2019 0640   TRIG 148 01/04/2019 0640   HDL 45 01/04/2019 0640   CHOLHDL 3.2 01/04/2019 0640   VLDL 30 01/04/2019 0640   LDLCALC 67 01/04/2019 0640   LDLDIRECT 71.0 10/30/2018 1030   HEMOGLOBIN A1C Lab Results  Component Value Date   HGBA1C 11.6 (H) 12/27/2018   MPG 263 07/12/2015   TSH No results for input(s): TSH in the last 8760 hours.  Medications and allergies   Allergies  Allergen Reactions  . Diltiazem Rash     Current Outpatient Medications  Medication Instructions  . acetaminophen (TYLENOL) 650 mg, Oral, Every 4 hours PRN  . amiodarone (PACERONE) 200 mg, Oral,  Daily  . aspirin 81 mg, Oral, Daily  . carvedilol (COREG) 6.25 mg, Oral, 2 times daily with meals  . Cholecalciferol (VITAMIN D3) 50 MCG (2000 UT) TABS Oral, 2 times daily  . empagliflozin (JARDIANCE) 25 mg, Oral, Daily  . ezetimibe (ZETIA) 10 mg, Oral, Daily at bedtime  . Fish Oil 1,000 mg, Oral, 2 times daily  . furosemide (LASIX) 40 mg, Oral, Daily  . insulin aspart protamine- aspart (NOVOLOG MIX 70/30) (70-30) 100 UNIT/ML injection 25 Units, Subcutaneous, 2 times daily with meals  . isosorbide  mononitrate (IMDUR) 30 mg, Oral, Daily  . liraglutide (VICTOZA) 1.8 mg, Subcutaneous, Daily  . metFORMIN (GLUCOPHAGE) 500 mg, Oral, 2 times daily with meals, Patient reports prescribed  4 times per day with meals- this is how patient report taking   . nitroGLYCERIN (NITROSTAT) 0.4 mg, Sublingual, Every 5 min x3 PRN  . potassium chloride SA (KLOR-CON) 20 MEQ tablet 20 mEq, Oral, 2 times daily  . PRESCRIPTION MEDICATION Inhalation, Daily at bedtime, CPAP  . rosuvastatin (CRESTOR) 40 mg, Oral, Daily at bedtime  . sacubitril-valsartan (ENTRESTO) 97-103 MG 1 tablet, Oral, 2 times daily  . sertraline (ZOLOFT) 50 mg, Oral, Daily at bedtime  . ticagrelor (BRILINTA) 90 mg, Oral, 2 times daily   Radiology:  No results found.  Cardiac Studies:   Renal artery duplex  09/02/2017: Right: Abnormal right Resistive Index. No evidence of right renal  artery stenosis. Left:  Normal left Resistive Index. No evidence of left renal artery stenosis. Mesenteric: Normal Celiac artery findings. 70 to 99% stenosis in the superior mesenteric artery.  Echocardiogram 01/04/2019:  1. Left ventricular ejection fraction, by visual estimation, is 25 to 30%. The left ventricle has severely decreased function. There is no left ventricular hypertrophy. Definity contrast agent was given IV to delineate the left ventricular endocardial borders.  Left ventricular diastolic parameters are consistent with Grade II diastolic dysfunction (pseudonormalization).  Mildly dilated left ventricular internal cavity size. The left ventricle demonstrates global hypokinesis. 2. RV not well visualized.  3. Left atrial size was mildly dilated.  Left Heart Catheterization 01/08/19:  1. Severe underlying three-vessel coronary artery disease with patent LIMA to LAD. Chronically occluded SVG to OM. Severe in-stent restenosis in left main stent extending into the proximal left circumflex. In addition, there is significant restenosis in the stent  placed in the left posterior AV groove artery. The RCA is known to be small in size and severely diseased. 2. Left ventricular angiography was not performed due to chronic kidney disease. LVEDP was 13 mmHg.  Coronary angioplasty of the left main and mid circumflex coronary artery 01/11/2019: Successful Wolverine cutting balloon angioplasty of the left main, 3.5 x 10 mm balloon utilized and high-pressure 12 atmospheric pressure inflations performed throughout the in-stent restenotic left main and proximal ostial circumflex and also mid circumflex coronary artery, 99% reduced to 0% with TIMI II to TIMI-3 flow. 60 mill contrast utilized.  Assessment     ICD-10-CM   1. Coronary artery disease involving native coronary artery of native heart without angina pectoris  I25.10   2. Coronary artery disease involving autologous artery coronary bypass graft without angina pectoris  I25.810   3. Chronic combined systolic and diastolic heart failure (HCC)  I50.42   4. ICD (implantable cardioverter-defibrillator) in place  Z95.810   5. Uncontrolled type 2 diabetes with stage 3 chronic kidney disease GFR 30-59 (HCC)  E11.22    E11.65    N18.30     EKG 01/16/2019: AV  paced rhythm at rate of 77 bpm, no further analysis.    Scheduled Remote ICD check 12.23.20:  One VR episode. No therapy. VF detection may be delayed: VF Detection Interval is faster than 300 ms (200 bpm). No AHR episodes.  OptiVol fluid accumulation: 21-Dec-2018 -- 06-Jan-2019. Battery longevity is 3.6 years. RA pacing is 66.1%, RV pacing is 99 %, and LV pacing is 99 %.  Recommendations:  No orders of the defined types were placed in this encounter.   Martin Bush  is a 64 y.o. Caucasian male with CAD and history of CABG x2 in 2002 with only LIMA to LAD being patent, Circumflex coronary artery is super dominant. H/O  Left main stenting in 2015 and in 2016 when he had complex PCI to in-stent restenotic circumflex/OM bifurcation, due to  recurrent restenosis, underwent stenting to left main and circumflex proximal and midsegment with implantation of 2 overlapping 3.5 x 20 and 3.0 x 20 mm  Synergy DES on 08/16/2017 when he presented with decompensated heart failure and recurrent VT.  Similar presentation on 01/03/2019, coronary angiography on 01/04/2018 revealing severely restenotic left main stent that extends into the circumflex proximal and also old mid circumflex stent and successful Wolverine 3.5 mm cutting balloon angioplasty on 01/11/19.   Past medical history significant for uncontrolled diabetes mellitus, hypertension, chronic systolic and diastolic heart failure, stage III chronic kidney disease, obstructive sleep apnea on CPAP.   He is presently doing well, no clinical evidence of heart failure, I also reviewed his ICD data, normal function and also no e/o CHF.  Continue dual antiplatelet therapy with aspirin and Brilinta.  Lipids are at goal.  Discussed compliance with diet again.  I reviewed his medical records, unfortunately diabetes continues to be very much uncontrolled.  Very poor long-term outcomes in view of underlying medical comorbidities including renal dysfunction.  Continue present medications and will see him in 3 months.   Adrian Prows, MD, [facility] 02/21/2019, 10:12 PM [facility] [place] Pager: [phone] Office: [phone]

## 2019-02-28 ENCOUNTER — Other Ambulatory Visit: Payer: Self-pay

## 2019-02-28 ENCOUNTER — Encounter: Payer: Self-pay | Admitting: *Deleted

## 2019-02-28 ENCOUNTER — Encounter: Payer: Medicare Other | Attending: Cardiology | Admitting: *Deleted

## 2019-02-28 DIAGNOSIS — I110 Hypertensive heart disease with heart failure: Secondary | ICD-10-CM | POA: Insufficient documentation

## 2019-02-28 DIAGNOSIS — E118 Type 2 diabetes mellitus with unspecified complications: Secondary | ICD-10-CM | POA: Insufficient documentation

## 2019-02-28 DIAGNOSIS — Z79899 Other long term (current) drug therapy: Secondary | ICD-10-CM | POA: Insufficient documentation

## 2019-02-28 DIAGNOSIS — Z794 Long term (current) use of insulin: Secondary | ICD-10-CM | POA: Insufficient documentation

## 2019-02-28 DIAGNOSIS — G4733 Obstructive sleep apnea (adult) (pediatric): Secondary | ICD-10-CM | POA: Insufficient documentation

## 2019-02-28 DIAGNOSIS — Z7901 Long term (current) use of anticoagulants: Secondary | ICD-10-CM | POA: Insufficient documentation

## 2019-02-28 DIAGNOSIS — I5042 Chronic combined systolic (congestive) and diastolic (congestive) heart failure: Secondary | ICD-10-CM | POA: Insufficient documentation

## 2019-02-28 DIAGNOSIS — I214 Non-ST elevation (NSTEMI) myocardial infarction: Secondary | ICD-10-CM | POA: Insufficient documentation

## 2019-02-28 DIAGNOSIS — Z9861 Coronary angioplasty status: Secondary | ICD-10-CM

## 2019-02-28 DIAGNOSIS — F329 Major depressive disorder, single episode, unspecified: Secondary | ICD-10-CM | POA: Insufficient documentation

## 2019-02-28 DIAGNOSIS — Z7982 Long term (current) use of aspirin: Secondary | ICD-10-CM | POA: Insufficient documentation

## 2019-02-28 DIAGNOSIS — Z87891 Personal history of nicotine dependence: Secondary | ICD-10-CM | POA: Insufficient documentation

## 2019-02-28 DIAGNOSIS — F439 Reaction to severe stress, unspecified: Secondary | ICD-10-CM | POA: Insufficient documentation

## 2019-02-28 DIAGNOSIS — E7800 Pure hypercholesterolemia, unspecified: Secondary | ICD-10-CM | POA: Insufficient documentation

## 2019-02-28 DIAGNOSIS — F419 Anxiety disorder, unspecified: Secondary | ICD-10-CM | POA: Insufficient documentation

## 2019-02-28 NOTE — Progress Notes (Signed)
Completed virtual orientation today.  EP eval scheduled for 2/9 at 2pm.

## 2019-03-06 ENCOUNTER — Other Ambulatory Visit: Payer: Self-pay

## 2019-03-06 ENCOUNTER — Encounter: Payer: Medicare Other | Admitting: *Deleted

## 2019-03-06 VITALS — Ht 67.75 in | Wt 213.2 lb

## 2019-03-06 DIAGNOSIS — F329 Major depressive disorder, single episode, unspecified: Secondary | ICD-10-CM | POA: Diagnosis not present

## 2019-03-06 DIAGNOSIS — Z79899 Other long term (current) drug therapy: Secondary | ICD-10-CM | POA: Diagnosis not present

## 2019-03-06 DIAGNOSIS — I214 Non-ST elevation (NSTEMI) myocardial infarction: Secondary | ICD-10-CM | POA: Diagnosis not present

## 2019-03-06 DIAGNOSIS — F439 Reaction to severe stress, unspecified: Secondary | ICD-10-CM | POA: Diagnosis not present

## 2019-03-06 DIAGNOSIS — E7800 Pure hypercholesterolemia, unspecified: Secondary | ICD-10-CM | POA: Diagnosis not present

## 2019-03-06 DIAGNOSIS — Z9861 Coronary angioplasty status: Secondary | ICD-10-CM | POA: Diagnosis not present

## 2019-03-06 DIAGNOSIS — G4733 Obstructive sleep apnea (adult) (pediatric): Secondary | ICD-10-CM | POA: Diagnosis not present

## 2019-03-06 DIAGNOSIS — F419 Anxiety disorder, unspecified: Secondary | ICD-10-CM | POA: Diagnosis not present

## 2019-03-06 DIAGNOSIS — Z794 Long term (current) use of insulin: Secondary | ICD-10-CM | POA: Diagnosis not present

## 2019-03-06 DIAGNOSIS — Z7982 Long term (current) use of aspirin: Secondary | ICD-10-CM | POA: Diagnosis not present

## 2019-03-06 DIAGNOSIS — I110 Hypertensive heart disease with heart failure: Secondary | ICD-10-CM | POA: Diagnosis not present

## 2019-03-06 DIAGNOSIS — E118 Type 2 diabetes mellitus with unspecified complications: Secondary | ICD-10-CM | POA: Diagnosis not present

## 2019-03-06 DIAGNOSIS — Z7901 Long term (current) use of anticoagulants: Secondary | ICD-10-CM | POA: Diagnosis not present

## 2019-03-06 DIAGNOSIS — Z87891 Personal history of nicotine dependence: Secondary | ICD-10-CM | POA: Diagnosis not present

## 2019-03-06 DIAGNOSIS — I5042 Chronic combined systolic (congestive) and diastolic (congestive) heart failure: Secondary | ICD-10-CM | POA: Diagnosis not present

## 2019-03-06 NOTE — Progress Notes (Signed)
Cardiac Individual Treatment Plan  Patient Details  Name: Martin Bush MRN: [id number] Date of Birth: 08/06/1955 Referring Provider:     Cardiac Rehab from 03/06/2019 in [facility] Cardiac and Pulmonary Rehab  Referring Provider  Adrian Prows MD      Initial Encounter Date:    Cardiac Rehab from 03/06/2019 in [facility] Cardiac and Pulmonary Rehab  Date  03/06/19      Visit Diagnosis: NSTEMI (non-ST elevated myocardial infarction) ([place])  S/P PTCA (percutaneous transluminal coronary angioplasty)  Patient's Home Medications on Admission:  Current Outpatient Medications:  .  acetaminophen (TYLENOL) 325 MG tablet, Take 2 tablets (650 mg total) by mouth every 4 (four) hours as needed for headache or mild pain., Disp:  , Rfl:  .  amiodarone (PACERONE) 400 MG tablet, Take 0.5 tablets (200 mg total) by mouth daily., Disp:  , Rfl:  .  aspirin EC 81 MG EC tablet, Take 1 tablet (81 mg total) by mouth daily., Disp:  , Rfl:  .  carvedilol (COREG) 6.25 MG tablet, Take 1 tablet (6.25 mg total) by mouth 2 (two) times daily with a meal., Disp:  , Rfl:  .  Cholecalciferol (VITAMIN D3) 50 MCG (2000 UT) TABS, Take by mouth 2 (two) times daily., Disp: , Rfl:  .  empagliflozin (JARDIANCE) 25 MG TABS tablet, Take 25 mg by mouth daily., Disp: , Rfl:  .  ezetimibe (ZETIA) 10 MG tablet, Take 1 tablet (10 mg total) by mouth at bedtime., Disp:  , Rfl:  .  furosemide (LASIX) 40 MG tablet, Take 40 mg by mouth daily., Disp: , Rfl:  .  insulin aspart protamine- aspart (NOVOLOG MIX 70/30) (70-30) 100 UNIT/ML injection, Inject 0.25 mLs (25 Units total) into the skin 2 (two) times daily with a meal. (Patient taking differently: Inject 63 Units into the skin 2 (two) times daily with a meal. ), Disp: 10 mL, Rfl: 11 .  isosorbide mononitrate (IMDUR) 30 MG 24 hr tablet, Take 1 tablet (30 mg total) by mouth daily., Disp:  , Rfl:  .  liraglutide (VICTOZA) 18 MG/3ML SOPN, Inject 1.8 mg into the skin daily., Disp: , Rfl:  .  metFORMIN  (GLUCOPHAGE) 500 MG tablet, Take 500 mg by mouth 2 (two) times daily with a meal. Patient reports prescribed  4 times per day with meals- this is how patient report taking, Disp: , Rfl:  .  nitroGLYCERIN (NITROSTAT) 0.4 MG SL tablet, Place 1 tablet (0.4 mg total) under the tongue every 5 (five) minutes x 3 doses as needed for chest pain., Disp:  , Rfl: 12 .  Omega-3 Fatty Acids (FISH OIL) 1000 MG CAPS, Take 1,000 mg by mouth 2 (two) times daily. , Disp: , Rfl:  .  potassium chloride SA (KLOR-CON) 20 MEQ tablet, Take 1 tablet (20 mEq total) by mouth 2 (two) times daily., Disp:  , Rfl:  .  PRESCRIPTION MEDICATION, Inhale into the lungs at bedtime. CPAP, Disp: , Rfl:  .  rosuvastatin (CRESTOR) 40 MG tablet, Take 1 tablet (40 mg total) by mouth at bedtime., Disp:  , Rfl:  .  sacubitril-valsartan (ENTRESTO) 97-103 MG, Take 1 tablet by mouth 2 (two) times daily., Disp: , Rfl:  .  sertraline (ZOLOFT) 50 MG tablet, Take 1 tablet (50 mg total) by mouth at bedtime., Disp:  , Rfl:  .  ticagrelor (BRILINTA) 90 MG TABS tablet, Take 1 tablet (90 mg total) by mouth 2 (two) times daily., Disp: 60 tablet, Rfl:   Past Medical History: Past  Medical History:  Diagnosis Date  . AICD (automatic cardioverter/defibrillator) present    Medtronic  . Arthritis    "thumbs"  (08/02/2017)  . CHF (congestive heart failure) ([place])   . Chronic combined systolic and diastolic heart failure ([place])    a. 08/2017 Echo: EF 20-25%, mod glob HK. Sev distal ant sept, inflat HK. Apical AK. Gr2 DD. Mildly reduced RV fxn.  . Colon polyps   . Coronary artery disease    a. s/p CABG x 2 (LIMA->LAD, VG->OM); b. Multiple PCI's to LM/LCX/OM; c. 07/2017 PTCA of LM/LCX w/ early ISR-->repeat PCI/DES to LM (3.5x20 Synergy DES) and LCX (3.0x20 Synergy DES); d. 07/2018 Relook Cath: LM patent stent, LAD 100ost, LCX patent stent, OM1 99/60, LIMA->LAD ok. VG->dLCX 48 (old).  . Depression   . Encounter for assessment of implantable  cardioverter-defibrillator (ICD) 09/27/2018  . High cholesterol   . Hypertension   . ICD; Biventricular  Medtronic ICD Amplia MRI QWuad CRT-D  in situ 10/29/14 10/29/2014   Remote ICD check 09.23.20:  One 6 beat NSVT. No therapy.  1 SVT episode @ 130 bpm (38 Sec).  There were 23 Vent sense episodes detected for up to 1.1 min/day (AT). Health trends (patient activity, heart rate variability, average heart rates) are stable.Trans-thoracic impedance trends and the OptiVol Fluid Index do no present significant abnormalities. Battery longevity is 4.3 years. RA pacing is 3  . Ischemic cardiomyopathy 09/27/2018  . MI (myocardial infarction) ([place]) 2003  . NSTEMI (non-ST elevated myocardial infarction) ([place]) 07/16/2014  . OSA on CPAP   . Oxygen deficiency   . Pneumonia 10/2014  . Proteinuria   . Sleep apnea   . 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 Packs/day: 0.25  . Years: 27.00  . Pack years: 6.75  . Types: Cigars  . Quit date: 2002  . Years since quitting: 19.1  Smokeless Tobacco Former Systems developer  . Quit date: 2002    Labs: Recent Review Flowsheet Data    Labs for ITP Cardiac and Pulmonary Rehab Latest Ref Rng & Units 10/30/2018 11/21/2018 12/27/2018 01/03/2019 01/04/2019   Cholestrol 0 - 200 mg/dL 144 - 103 - 142   LDLCALC 0 - 99 mg/dL - - 34 - 67   LDLDIRECT mg/dL 71.0 - - - -   HDL >40 mg/dL 39.60 - 32.10(L) - 45   Trlycerides <150 mg/dL 240.0(H) - 182.0(H) - 148   Hemoglobin A1c 4.6 - 6.5 % - 14.7 11.6(H) - -   PHART 7.350 - 7.450 - - - - -   PCO2ART 32.0 - 48.0 mmHg - - - - -   HCO3 20.0 - 28.0 mmol/L - - - 22.9 -   TCO2 22 - 32 mmol/L - - - - -   ACIDBASEDEF 0.0 - 2.0 mmol/L - - - 5.5(H) -   O2SAT % - - - 92.7 -       Exercise Target Goals: Exercise Program Goal: Individual exercise prescription set using results from initial 6 min walk test and THRR while considering  patient's  activity barriers and safety.   Exercise Prescription Goal: Initial exercise prescription builds to 30-45 minutes a day of aerobic activity, 2-3 days per week.  Home exercise guidelines will be given to patient during program as part of exercise prescription that the participant will acknowledge.  Activity Barriers & Risk Stratification: Activity Barriers & Cardiac Risk  Stratification - 03/06/19 1527      Activity Barriers & Cardiac Risk Stratification   Activity Barriers  Arthritis;Back Problems;Deconditioning;Muscular Weakness;Joint Problems;Balance Concerns   arth is thumbs and back   Cardiac Risk Stratification  High       6 Minute Walk: 6 Minute Walk    Row Name 03/06/19 1526         6 Minute Walk   Phase  Initial     Distance  1325 feet     Walk Time  6 minutes     # of Rest Breaks  0     MPH  2.51     METS  3.18     RPE  13     Perceived Dyspnea   1     VO2 Peak  11.15     Symptoms  Yes (comment)     Comments  SOB     Resting HR  65 bpm     Resting BP  124/70     Resting Oxygen Saturation   97 %     Exercise Oxygen Saturation  during 6 min walk  98 %     Max Ex. HR  110 bpm     Max Ex. BP  138/74     2 Minute Post BP  128/74        Oxygen Initial Assessment:   Oxygen Re-Evaluation:   Oxygen Discharge (Final Oxygen Re-Evaluation):   Initial Exercise Prescription: Initial Exercise Prescription - 03/06/19 1500      Date of Initial Exercise RX and Referring Provider   Date  03/06/19    Referring Provider  Adrian Prows MD      Treadmill   MPH  2.3    Grade  0.5    Minutes  15    METs  2.9      NuStep   Level  2    SPM  80    Minutes  15    METs  2      Biostep-RELP   Level  2    SPM  50    Minutes  15    METs  2      Prescription Details   Frequency (times per week)  3    Duration  Progress to 30 minutes of continuous aerobic without signs/symptoms of physical distress      Intensity   THRR 40-80% of Max Heartrate  102-139    Ratings  of Perceived Exertion  11-13    Perceived Dyspnea  0-4      Progression   Progression  Continue to progress workloads to maintain intensity without signs/symptoms of physical distress.      Resistance Training   Training Prescription  Yes    Weight  3 lb       Perform Capillary Blood Glucose checks as needed.  Exercise Prescription Changes: Exercise Prescription Changes    Row Name 03/06/19 1500             Response to Exercise   Blood Pressure (Admit)  124/70       Blood Pressure (Exercise)  138/74       Blood Pressure (Exit)  128/74       Heart Rate (Admit)  65 bpm       Heart Rate (Exercise)  110 bpm       Heart Rate (Exit)  75 bpm       Oxygen Saturation (Admit)  97 %  Oxygen Saturation (Exercise)  98 %       Rating of Perceived Exertion (Exercise)  13       Perceived Dyspnea (Exercise)  1       Symptoms  SOB       Comments  walk test results          Exercise Comments:   Exercise Goals and Review: Exercise Goals    Row Name 03/06/19 1538             Exercise Goals   Increase Physical Activity  Yes       Intervention  Provide advice, education, support and counseling about physical activity/exercise needs.;Develop an individualized exercise prescription for aerobic and resistive training based on initial evaluation findings, risk stratification, comorbidities and participant's personal goals.       Expected Outcomes  Short Term: Attend rehab on a regular basis to increase amount of physical activity.;Long Term: Add in home exercise to make exercise part of routine and to increase amount of physical activity.;Long Term: Exercising regularly at least 3-5 days a week.       Increase Strength and Stamina  Yes       Intervention  Provide advice, education, support and counseling about physical activity/exercise needs.;Develop an individualized exercise prescription for aerobic and resistive training based on initial evaluation findings, risk stratification,  comorbidities and participant's personal goals.       Expected Outcomes  Short Term: Increase workloads from initial exercise prescription for resistance, speed, and METs.;Short Term: Perform resistance training exercises routinely during rehab and add in resistance training at home;Long Term: Improve cardiorespiratory fitness, muscular endurance and strength as measured by increased METs and functional capacity (6MWT)       Able to understand and use rate of perceived exertion (RPE) scale  Yes       Intervention  Provide education and explanation on how to use RPE scale       Expected Outcomes  Short Term: Able to use RPE daily in rehab to express subjective intensity level;Long Term:  Able to use RPE to guide intensity level when exercising independently       Able to understand and use Dyspnea scale  Yes       Intervention  Provide education and explanation on how to use Dyspnea scale       Expected Outcomes  Short Term: Able to use Dyspnea scale daily in rehab to express subjective sense of shortness of breath during exertion;Long Term: Able to use Dyspnea scale to guide intensity level when exercising independently       Knowledge and understanding of Target Heart Rate Range (THRR)  Yes       Intervention  Provide education and explanation of THRR including how the numbers were predicted and where they are located for reference       Expected Outcomes  Short Term: Able to state/look up THRR;Short Term: Able to use daily as guideline for intensity in rehab;Long Term: Able to use THRR to govern intensity when exercising independently       Able to check pulse independently  Yes       Intervention  Provide education and demonstration on how to check pulse in carotid and radial arteries.;Review the importance of being able to check your own pulse for safety during independent exercise       Expected Outcomes  Short Term: Able to explain why pulse checking is important during independent exercise;Long  Term: Able to check pulse  independently and accurately       Understanding of Exercise Prescription  Yes       Intervention  Provide education, explanation, and written materials on patient's individual exercise prescription       Expected Outcomes  Short Term: Able to explain program exercise prescription;Long Term: Able to explain home exercise prescription to exercise independently          Exercise Goals Re-Evaluation :   Discharge Exercise Prescription (Final Exercise Prescription Changes): Exercise Prescription Changes - 03/06/19 1500      Response to Exercise   Blood Pressure (Admit)  124/70    Blood Pressure (Exercise)  138/74    Blood Pressure (Exit)  128/74    Heart Rate (Admit)  65 bpm    Heart Rate (Exercise)  110 bpm    Heart Rate (Exit)  75 bpm    Oxygen Saturation (Admit)  97 %    Oxygen Saturation (Exercise)  98 %    Rating of Perceived Exertion (Exercise)  13    Perceived Dyspnea (Exercise)  1    Symptoms  SOB    Comments  walk test results       Nutrition:  Target Goals: Understanding of nutrition guidelines, daily intake of sodium <1530m, cholesterol <2034m calories 30% from fat and 7% or less from saturated fats, daily to have 5 or more servings of fruits and vegetables.  Biometrics: Pre Biometrics - 03/06/19 1606      Pre Biometrics   Height  5' 7.75" (1.721 m)    Weight  213 lb 3.2 oz (96.7 kg)    BMI (Calculated)  32.65    Single Leg Stand  3.19 seconds        Nutrition Therapy Plan and Nutrition Goals:   Nutrition Assessments: Nutrition Assessments - 03/06/19 1607      MEDFICTS Scores   Pre Score  42       Nutrition Goals Re-Evaluation:   Nutrition Goals Discharge (Final Nutrition Goals Re-Evaluation):   Psychosocial: Target Goals: Acknowledge presence or absence of significant depression and/or stress, maximize coping skills, provide positive support system. Participant is able to verbalize types and ability to use techniques  and skills needed for reducing stress and depression.   Initial Review & Psychosocial Screening: Initial Psych Review & Screening - 02/28/19 1109      Initial Review   Current issues with  History of Depression;Current Psychotropic Meds;Current Stress Concerns    Source of Stress Concerns  Chronic Illness    Comments  No current symptoms of depression on Zoloft and feels that it is working well for him, significant health history      FaHachita Yes   wife     Barriers   Psychosocial barriers to participate in program  The patient should benefit from training in stress management and relaxation.;Psychosocial barriers identified (see note)      Screening Interventions   Interventions  Encouraged to exercise;To provide support and resources with identified psychosocial needs;Provide feedback about the scores to participant    Expected Outcomes  Long Term Goal: Stressors or current issues are controlled or eliminated.;Short Term goal: Identification and review with participant of any Quality of Life or Depression concerns found by scoring the questionnaire.;Short Term goal: Utilizing psychosocial counselor, staff and physician to assist with identification of specific Stressors or current issues interfering with healing process. Setting desired goal for each stressor or current issue identified.;Long Term goal: The participant improves  quality of Life and PHQ9 Scores as seen by post scores and/or verbalization of changes       Quality of Life Scores:  Quality of Life - 03/06/19 1607      Quality of Life   Select  Quality of Life      Quality of Life Scores   Health/Function Pre  20.57 %    Socioeconomic Pre  27 %    Psych/Spiritual Pre  25.5 %    Family Pre  30 %    GLOBAL Pre  24.41 %      Scores of 19 and below usually indicate a poorer quality of life in these areas.  A difference of  2-3 points is a clinically meaningful difference.  A difference of  2-3 points in the total score of the Quality of Life Index has been associated with significant improvement in overall quality of life, self-image, physical symptoms, and general health in studies assessing change in quality of life.  PHQ-9: Recent Review Flowsheet Data    Depression screen [facility] 2/9 03/06/2019 01/15/2019 12/04/2018 09/29/2018 08/30/2018   Decreased Interest 0 0 0 0 0   Down, Depressed, Hopeless 0 0 0 0 0   PHQ - 2 Score 0 0 0 0 0   Altered sleeping 1 - - 0 -   Tired, decreased energy 0 - - 0 -   Change in appetite 0 - - 0 -   Feeling bad or failure about yourself  0 - - 0 -   Trouble concentrating 0 - - 0 -   Moving slowly or fidgety/restless 0 - - 0 -   Suicidal thoughts 0 - - 0 -   PHQ-9 Score 1 - - 0 -   Difficult doing work/chores Not difficult at all - - Not difficult at all -     Interpretation of Total Score  Total Score Depression Severity:  1-4 = Minimal depression, 5-9 = Mild depression, 10-14 = Moderate depression, 15-19 = Moderately severe depression, 20-27 = Severe depression   Psychosocial Evaluation and Intervention: Psychosocial Evaluation - 02/28/19 1115      Psychosocial Evaluation & Interventions   Interventions  Encouraged to exercise with the program and follow exercise prescription    Comments  Reyhan 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  He has a Estate manager/land agent in place.  He also has uncontrolled diabetes according to office notes, but feels that he is doing well overall.  His wife is his primary support system and they try not to have too many worries.  He is looking forward to getting his heart stronger and back in better shape overall.    Expected Outcomes  Short: Attend rehab to rebuild stamina again.  Long: Continue to cope well with his health.    Continue Psychosocial Services   Follow up required by staff       Psychosocial Re-Evaluation:   Psychosocial  Discharge (Final Psychosocial Re-Evaluation):   Vocational Rehabilitation: Provide vocational rehab assistance to qualifying candidates.   Vocational Rehab Evaluation & Intervention: Vocational Rehab - 02/28/19 1109      Initial Vocational Rehab Evaluation & Intervention   Assessment shows need for Vocational Rehabilitation  No       Education: Education Goals: Education classes will be provided on a variety of topics geared toward better understanding of heart health and risk factor modification. Participant will state understanding/return  demonstration of topics presented as noted by education test scores.  Learning Barriers/Preferences: Learning Barriers/Preferences - 02/28/19 1108      Learning Barriers/Preferences   Learning Barriers  Sight   glasses   Learning Preferences  None       Education Topics:  AED/CPR: - Group verbal and written instruction with the use of models to demonstrate the basic use of the AED with the basic ABC's of resuscitation.   General Nutrition Guidelines/Fats and Fiber: -Group instruction provided by verbal, written material, models and posters to present the general guidelines for heart healthy nutrition. Gives an explanation and review of dietary fats and fiber.   Controlling Sodium/Reading Food Labels: -Group verbal and written material supporting the discussion of sodium use in heart healthy nutrition. Review and explanation with models, verbal and written materials for utilization of the food label.   Exercise Physiology & General Exercise Guidelines: - Group verbal and written instruction with models to review the exercise physiology of the cardiovascular system and associated critical values. Provides general exercise guidelines with specific guidelines to those with heart or lung disease.    Aerobic Exercise & Resistance Training: - Gives group verbal and written instruction on the various components of exercise. Focuses on aerobic  and resistive training programs and the benefits of this training and how to safely progress through these programs..   Flexibility, Balance, Mind/Body Relaxation: Provides group verbal/written instruction on the benefits of flexibility and balance training, including mind/body exercise modes such as yoga, pilates and tai chi.  Demonstration and skill practice provided.   Stress and Anxiety: - Provides group verbal and written instruction about the health risks of elevated stress and causes of high stress.  Discuss the correlation between heart/lung disease and anxiety and treatment options. Review healthy ways to manage with stress and anxiety.   Depression: - Provides group verbal and written instruction on the correlation between heart/lung disease and depressed mood, treatment options, and the stigmas associated with seeking treatment.   Anatomy & Physiology of the Heart: - Group verbal and written instruction and models provide basic cardiac anatomy and physiology, with the coronary electrical and arterial systems. Review of Valvular disease and Heart Failure   Cardiac Procedures: - Group verbal and written instruction to review commonly prescribed medications for heart disease. Reviews the medication, class of the drug, and side effects. Includes the steps to properly store meds and maintain the prescription regimen. (beta blockers and nitrates)   Cardiac Medications I: - Group verbal and written instruction to review commonly prescribed medications for heart disease. Reviews the medication, class of the drug, and side effects. Includes the steps to properly store meds and maintain the prescription regimen.   Cardiac Medications II: -Group verbal and written instruction to review commonly prescribed medications for heart disease. Reviews the medication, class of the drug, and side effects. (all other drug classes)    Go Sex-Intimacy & Heart Disease, Get SMART - Goal Setting: -  Group verbal and written instruction through game format to discuss heart disease and the return to sexual intimacy. Provides group verbal and written material to discuss and apply goal setting through the application of the S.M.A.R.T. Method.   Other Matters of the Heart: - Provides group verbal, written materials and models to describe Stable Angina and Peripheral Artery. Includes description of the disease process and treatment options available to the cardiac patient.   Exercise & Equipment Safety: - Individual verbal instruction and demonstration of equipment use and safety with  use of the equipment.   Cardiac Rehab from 03/06/2019 in [facility] Cardiac and Pulmonary Rehab  Date  03/06/19  Educator  [facility]  Instruction Review Code  1- Verbalizes Understanding      Infection Prevention: - Provides verbal and written material to individual with discussion of infection control including proper hand washing and proper equipment cleaning during exercise session.   Cardiac Rehab from 03/06/2019 in [facility] Cardiac and Pulmonary Rehab  Date  03/06/19  Educator  [facility]  Instruction Review Code  1- Verbalizes Understanding      Falls Prevention: - Provides verbal and written material to individual with discussion of falls prevention and safety.   Cardiac Rehab from 03/06/2019 in [facility] Cardiac and Pulmonary Rehab  Date  03/06/19  Educator  [facility]  Instruction Review Code  1- Verbalizes Understanding      Diabetes: - Individual verbal and written instruction to review signs/symptoms of diabetes, desired ranges of glucose level fasting, after meals and with exercise. Acknowledge that pre and post exercise glucose checks will be done for 3 sessions at entry of program.   Know Your Numbers and Risk Factors: -Group verbal and written instruction about important numbers in your health.  Discussion of what are risk factors and how they play a role in the disease process.  Review of Cholesterol, Blood Pressure,  Diabetes, and BMI and the role they play in your overall health.   Sleep Hygiene: -Provides group verbal and written instruction about how sleep can affect your health.  Define sleep hygiene, discuss sleep cycles and impact of sleep habits. Review good sleep hygiene tips.    Other: -Provides group and verbal instruction on various topics (see comments)   Knowledge Questionnaire Score: Knowledge Questionnaire Score - 03/06/19 1607      Knowledge Questionnaire Score   Pre Score  24/26 Education Focus: Angina and Nurtition       Core Components/Risk Factors/Patient Goals at Admission: Personal Goals and Risk Factors at Admission - 03/06/19 1608      Core Components/Risk Factors/Patient Goals on Admission    Weight Management  Yes;Weight Loss;Obesity    Intervention  Weight Management: Develop a combined nutrition and exercise program designed to reach desired caloric intake, while maintaining appropriate intake of nutrient and fiber, sodium and fats, and appropriate energy expenditure required for the weight goal.;Weight Management: Provide education and appropriate resources to help participant work on and attain dietary goals.;Weight Management/Obesity: Establish reasonable short term and long term weight goals.;Obesity: Provide education and appropriate resources to help participant work on and attain dietary goals.    Admit Weight  213 lb 3.2 oz (96.7 kg)    Goal Weight: Short Term  208 lb (94.3 kg)    Goal Weight: Long Term  200 lb (90.7 kg)    Expected Outcomes  Long Term: Adherence to nutrition and physical activity/exercise program aimed toward attainment of established weight goal;Short Term: Continue to assess and modify interventions until short term weight is achieved;Weight Loss: Understanding of general recommendations for a balanced deficit meal plan, which promotes 1-2 lb weight loss per week and includes a negative energy balance of [phone] kcal/d;Understanding  recommendations for meals to include 15-35% energy as protein, 25-35% energy from fat, 35-60% energy from carbohydrates, less than 248m of dietary cholesterol, 20-35 gm of total fiber daily;Understanding of distribution of calorie intake throughout the day with the consumption of 4-5 meals/snacks    Diabetes  Yes    Intervention  Provide education about signs/symptoms and action  to take for hypo/hyperglycemia.;Provide education about proper nutrition, including hydration, and aerobic/resistive exercise prescription along with prescribed medications to achieve blood glucose in normal ranges: Fasting glucose 65-99 mg/dL    Expected Outcomes  Short Term: Participant verbalizes understanding of the signs/symptoms and immediate care of hyper/hypoglycemia, proper foot care and importance of medication, aerobic/resistive exercise and nutrition plan for blood glucose control.;Long Term: Attainment of HbA1C < 7%.    Heart Failure  Yes    Intervention  Provide a combined exercise and nutrition program that is supplemented with education, support and counseling about heart failure. Directed toward relieving symptoms such as shortness of breath, decreased exercise tolerance, and extremity edema.    Expected Outcomes  Improve functional capacity of life;Short term: Attendance in program 2-3 days a week with increased exercise capacity. Reported lower sodium intake. Reported increased fruit and vegetable intake. Reports medication compliance.;Short term: Daily weights obtained and reported for increase. Utilizing diuretic protocols set by physician.;Long term: Adoption of self-care skills and reduction of barriers for early signs and symptoms recognition and intervention leading to self-care maintenance.    Hypertension  Yes    Intervention  Provide education on lifestyle modifcations including regular physical activity/exercise, weight management, moderate sodium restriction and increased consumption of fresh fruit,  vegetables, and low fat dairy, alcohol moderation, and smoking cessation.;Monitor prescription use compliance.    Expected Outcomes  Short Term: Continued assessment and intervention until BP is < 140/86m HG in hypertensive participants. < 130/833mHG in hypertensive participants with diabetes, heart failure or chronic kidney disease.    Lipids  Yes    Intervention  Provide education and support for participant on nutrition & aerobic/resistive exercise along with prescribed medications to achieve LDL <7045mHDL >39m61m  Expected Outcomes  Short Term: Participant states understanding of desired cholesterol values and is compliant with medications prescribed. Participant is following exercise prescription and nutrition guidelines.;Long Term: Cholesterol controlled with medications as prescribed, with individualized exercise RX and with personalized nutrition plan. Value goals: LDL < 70mg10mL > 40 mg.       Core Components/Risk Factors/Patient Goals Review:    Core Components/Risk Factors/Patient Goals at Discharge (Final Review):    ITP Comments: ITP Comments    Row Name 02/28/19 1120 03/06/19 1526         ITP Comments  Completed virtual orientation today.  EP eval scheduled for 2/9 at 2pm. [facility]  Completed 6MWT and gym orientation.  Initial ITP created and sent for review to Dr. Mark Emily Filbertical Director.         Comments: Initial ITP

## 2019-03-06 NOTE — Patient Instructions (Signed)
Patient Instructions  Patient Details  Name: Martin Bush MRN: [id number] Date of Birth: Feb 07, 1955 Referring Provider:  Adrian Prows, MD  Below are your personal goals for exercise, nutrition, and risk factors. Our goal is to help you stay on track towards obtaining and maintaining these goals. We will be discussing your progress on these goals with you throughout the program.  Initial Exercise Prescription: Initial Exercise Prescription - 03/06/19 1500      Date of Initial Exercise RX and Referring Provider   Date  03/06/19    Referring Provider  Adrian Prows MD      Treadmill   MPH  2.3    Grade  0.5    Minutes  15    METs  2.9      NuStep   Level  2    SPM  80    Minutes  15    METs  2      Biostep-RELP   Level  2    SPM  50    Minutes  15    METs  2      Prescription Details   Frequency (times per week)  3    Duration  Progress to 30 minutes of continuous aerobic without signs/symptoms of physical distress      Intensity   THRR 40-80% of Max Heartrate  102-139    Ratings of Perceived Exertion  11-13    Perceived Dyspnea  0-4      Progression   Progression  Continue to progress workloads to maintain intensity without signs/symptoms of physical distress.      Resistance Training   Training Prescription  Yes    Weight  3 lb       Exercise Goals: Frequency: Be able to perform aerobic exercise two to three times per week in program working toward 2-5 days per week of home exercise.  Intensity: Work with a perceived exertion of 11 (fairly light) - 15 (hard) while following your exercise prescription.  We will make changes to your prescription with you as you progress through the program.   Duration: Be able to do 30 to 45 minutes of continuous aerobic exercise in addition to a 5 minute warm-up and a 5 minute cool-down routine.   Nutrition Goals: Your personal nutrition goals will be established when you do your nutrition analysis with the dietician.  The  following are general nutrition guidelines to follow: Cholesterol < 200mg /day Sodium < 1500mg /day Fiber: Men over 50 yrs - 30 grams per day  Personal Goals: Personal Goals and Risk Factors at Admission - 03/06/19 1608      Core Components/Risk Factors/Patient Goals on Admission    Weight Management  Yes;Weight Loss;Obesity    Intervention  Weight Management: Develop a combined nutrition and exercise program designed to reach desired caloric intake, while maintaining appropriate intake of nutrient and fiber, sodium and fats, and appropriate energy expenditure required for the weight goal.;Weight Management: Provide education and appropriate resources to help participant work on and attain dietary goals.;Weight Management/Obesity: Establish reasonable short term and long term weight goals.;Obesity: Provide education and appropriate resources to help participant work on and attain dietary goals.    Admit Weight  213 lb 3.2 oz (96.7 kg)    Goal Weight: Short Term  208 lb (94.3 kg)    Goal Weight: Long Term  200 lb (90.7 kg)    Expected Outcomes  Long Term: Adherence to nutrition and physical activity/exercise program aimed toward attainment of established weight  goal;Short Term: Continue to assess and modify interventions until short term weight is achieved;Weight Loss: Understanding of general recommendations for a balanced deficit meal plan, which promotes 1-2 lb weight loss per week and includes a negative energy balance of [phone] kcal/d;Understanding recommendations for meals to include 15-35% energy as protein, 25-35% energy from fat, 35-60% energy from carbohydrates, less than 200mg  of dietary cholesterol, 20-35 gm of total fiber daily;Understanding of distribution of calorie intake throughout the day with the consumption of 4-5 meals/snacks    Diabetes  Yes    Intervention  Provide education about signs/symptoms and action to take for hypo/hyperglycemia.;Provide education about proper nutrition,  including hydration, and aerobic/resistive exercise prescription along with prescribed medications to achieve blood glucose in normal ranges: Fasting glucose 65-99 mg/dL    Expected Outcomes  Short Term: Participant verbalizes understanding of the signs/symptoms and immediate care of hyper/hypoglycemia, proper foot care and importance of medication, aerobic/resistive exercise and nutrition plan for blood glucose control.;Long Term: Attainment of HbA1C < 7%.    Heart Failure  Yes    Intervention  Provide a combined exercise and nutrition program that is supplemented with education, support and counseling about heart failure. Directed toward relieving symptoms such as shortness of breath, decreased exercise tolerance, and extremity edema.    Expected Outcomes  Improve functional capacity of life;Short term: Attendance in program 2-3 days a week with increased exercise capacity. Reported lower sodium intake. Reported increased fruit and vegetable intake. Reports medication compliance.;Short term: Daily weights obtained and reported for increase. Utilizing diuretic protocols set by physician.;Long term: Adoption of self-care skills and reduction of barriers for early signs and symptoms recognition and intervention leading to self-care maintenance.    Hypertension  Yes    Intervention  Provide education on lifestyle modifcations including regular physical activity/exercise, weight management, moderate sodium restriction and increased consumption of fresh fruit, vegetables, and low fat dairy, alcohol moderation, and smoking cessation.;Monitor prescription use compliance.    Expected Outcomes  Short Term: Continued assessment and intervention until BP is < 140/73mm HG in hypertensive participants. < 130/70mm HG in hypertensive participants with diabetes, heart failure or chronic kidney disease.    Lipids  Yes    Intervention  Provide education and support for participant on nutrition & aerobic/resistive exercise  along with prescribed medications to achieve LDL 70mg , HDL >40mg .    Expected Outcomes  Short Term: Participant states understanding of desired cholesterol values and is compliant with medications prescribed. Participant is following exercise prescription and nutrition guidelines.;Long Term: Cholesterol controlled with medications as prescribed, with individualized exercise RX and with personalized nutrition plan. Value goals: LDL < 70mg , HDL > 40 mg.       Tobacco Use Initial Evaluation: Social History   Tobacco Use  Smoking Status Former Smoker  . Packs/day: 0.25  . Years: 27.00  . Pack years: 6.75  . Types: Cigars  . Quit date: 2002  . Years since quitting: 19.1  Smokeless Tobacco Former Systems developer  . Quit date: 2002    Exercise Goals and Review: Exercise Goals    Row Name 03/06/19 1538             Exercise Goals   Increase Physical Activity  Yes       Intervention  Provide advice, education, support and counseling about physical activity/exercise needs.;Develop an individualized exercise prescription for aerobic and resistive training based on initial evaluation findings, risk stratification, comorbidities and participant's personal goals.       Expected Outcomes  Short  Term: Attend rehab on a regular basis to increase amount of physical activity.;Long Term: Add in home exercise to make exercise part of routine and to increase amount of physical activity.;Long Term: Exercising regularly at least 3-5 days a week.       Increase Strength and Stamina  Yes       Intervention  Provide advice, education, support and counseling about physical activity/exercise needs.;Develop an individualized exercise prescription for aerobic and resistive training based on initial evaluation findings, risk stratification, comorbidities and participant's personal goals.       Expected Outcomes  Short Term: Increase workloads from initial exercise prescription for resistance, speed, and METs.;Short Term:  Perform resistance training exercises routinely during rehab and add in resistance training at home;Long Term: Improve cardiorespiratory fitness, muscular endurance and strength as measured by increased METs and functional capacity (6MWT)       Able to understand and use rate of perceived exertion (RPE) scale  Yes       Intervention  Provide education and explanation on how to use RPE scale       Expected Outcomes  Short Term: Able to use RPE daily in rehab to express subjective intensity level;Long Term:  Able to use RPE to guide intensity level when exercising independently       Able to understand and use Dyspnea scale  Yes       Intervention  Provide education and explanation on how to use Dyspnea scale       Expected Outcomes  Short Term: Able to use Dyspnea scale daily in rehab to express subjective sense of shortness of breath during exertion;Long Term: Able to use Dyspnea scale to guide intensity level when exercising independently       Knowledge and understanding of Target Heart Rate Range (THRR)  Yes       Intervention  Provide education and explanation of THRR including how the numbers were predicted and where they are located for reference       Expected Outcomes  Short Term: Able to state/look up THRR;Short Term: Able to use daily as guideline for intensity in rehab;Long Term: Able to use THRR to govern intensity when exercising independently       Able to check pulse independently  Yes       Intervention  Provide education and demonstration on how to check pulse in carotid and radial arteries.;Review the importance of being able to check your own pulse for safety during independent exercise       Expected Outcomes  Short Term: Able to explain why pulse checking is important during independent exercise;Long Term: Able to check pulse independently and accurately       Understanding of Exercise Prescription  Yes       Intervention  Provide education, explanation, and written materials on  patient's individual exercise prescription       Expected Outcomes  Short Term: Able to explain program exercise prescription;Long Term: Able to explain home exercise prescription to exercise independently          Copy of goals given to participant.

## 2019-03-08 ENCOUNTER — Other Ambulatory Visit: Payer: Self-pay

## 2019-03-08 ENCOUNTER — Encounter: Payer: Medicare Other | Admitting: *Deleted

## 2019-03-08 DIAGNOSIS — G4733 Obstructive sleep apnea (adult) (pediatric): Secondary | ICD-10-CM | POA: Diagnosis not present

## 2019-03-08 DIAGNOSIS — Z7901 Long term (current) use of anticoagulants: Secondary | ICD-10-CM | POA: Diagnosis not present

## 2019-03-08 DIAGNOSIS — Z79899 Other long term (current) drug therapy: Secondary | ICD-10-CM | POA: Diagnosis not present

## 2019-03-08 DIAGNOSIS — I214 Non-ST elevation (NSTEMI) myocardial infarction: Secondary | ICD-10-CM | POA: Diagnosis not present

## 2019-03-08 DIAGNOSIS — Z794 Long term (current) use of insulin: Secondary | ICD-10-CM | POA: Diagnosis not present

## 2019-03-08 DIAGNOSIS — Z7982 Long term (current) use of aspirin: Secondary | ICD-10-CM | POA: Diagnosis not present

## 2019-03-08 DIAGNOSIS — I110 Hypertensive heart disease with heart failure: Secondary | ICD-10-CM | POA: Diagnosis not present

## 2019-03-08 DIAGNOSIS — Z9861 Coronary angioplasty status: Secondary | ICD-10-CM

## 2019-03-08 DIAGNOSIS — E118 Type 2 diabetes mellitus with unspecified complications: Secondary | ICD-10-CM | POA: Diagnosis not present

## 2019-03-08 DIAGNOSIS — Z87891 Personal history of nicotine dependence: Secondary | ICD-10-CM | POA: Diagnosis not present

## 2019-03-08 DIAGNOSIS — I5042 Chronic combined systolic (congestive) and diastolic (congestive) heart failure: Secondary | ICD-10-CM | POA: Diagnosis not present

## 2019-03-08 DIAGNOSIS — E7800 Pure hypercholesterolemia, unspecified: Secondary | ICD-10-CM | POA: Diagnosis not present

## 2019-03-08 LAB — GLUCOSE, CAPILLARY
Glucose-Capillary: 161 mg/dL — ABNORMAL HIGH (ref 70–99)
Glucose-Capillary: 113 mg/dL — ABNORMAL HIGH (ref 70–99)

## 2019-03-08 NOTE — Progress Notes (Signed)
Daily Session Note  Patient Details  Name: Martin Bush MRN: [id number] Date of Birth: 12/17/1955 Referring Provider:     Cardiac Rehab from 03/06/2019 in [facility] Cardiac and Pulmonary Rehab  Referring Provider  Adrian Prows MD      Encounter Date: 03/08/2019  Check In: Session Check In - 03/08/19 1621      Check-In   Supervising physician immediately available to respond to emergencies  See telemetry face sheet for immediately available ER MD    Location  [facility]-Cardiac & Pulmonary Rehab    Staff Present  Renita Papa, RN Moises Blood, BS, ACSM CEP, Exercise Physiologist;Joseph Darrin Nipper, [name], RCEP, CCRP, CCET    Virtual Visit  No    Medication changes reported      No    Fall or balance concerns reported     No    Warm-up and Cool-down  Performed on first and last piece of equipment    Resistance Training Performed  Yes    VAD Patient?  No    PAD/SET Patient?  No      Pain Assessment   Currently in Pain?  No/denies          Social History   Tobacco Use  Smoking Status Former Smoker  . Packs/day: 0.25  . Years: 27.00  . Pack years: 6.75  . Types: Cigars  . Quit date: 2002  . Years since quitting: 19.1  Smokeless Tobacco Former Systems developer  . Quit date: 2002    Goals Met:  Independence with exercise equipment Exercise tolerated well No report of cardiac concerns or symptoms Strength training completed today  Goals Unmet:  Not Applicable  Comments: First full day of exercise!  Patient was oriented to gym and equipment including functions, settings, policies, and procedures.  Patient's individual exercise prescription and treatment plan were reviewed.  All starting workloads were established based on the results of the 6 minute walk test done at initial orientation visit.  The plan for exercise progression was also introduced and progression will be customized based on patient's performance and goals.    Dr. Emily Filbert is Medical Director for  [place] and [facility].

## 2019-03-10 DIAGNOSIS — I5043 Acute on chronic combined systolic (congestive) and diastolic (congestive) heart failure: Secondary | ICD-10-CM | POA: Diagnosis not present

## 2019-03-12 ENCOUNTER — Other Ambulatory Visit: Payer: Self-pay

## 2019-03-12 ENCOUNTER — Encounter: Payer: Medicare Other | Admitting: *Deleted

## 2019-03-12 DIAGNOSIS — Z794 Long term (current) use of insulin: Secondary | ICD-10-CM | POA: Diagnosis not present

## 2019-03-12 DIAGNOSIS — Z9861 Coronary angioplasty status: Secondary | ICD-10-CM | POA: Diagnosis not present

## 2019-03-12 DIAGNOSIS — Z79899 Other long term (current) drug therapy: Secondary | ICD-10-CM | POA: Diagnosis not present

## 2019-03-12 DIAGNOSIS — I214 Non-ST elevation (NSTEMI) myocardial infarction: Secondary | ICD-10-CM | POA: Diagnosis not present

## 2019-03-12 DIAGNOSIS — I110 Hypertensive heart disease with heart failure: Secondary | ICD-10-CM | POA: Diagnosis not present

## 2019-03-12 DIAGNOSIS — I5042 Chronic combined systolic (congestive) and diastolic (congestive) heart failure: Secondary | ICD-10-CM | POA: Diagnosis not present

## 2019-03-12 DIAGNOSIS — Z7982 Long term (current) use of aspirin: Secondary | ICD-10-CM | POA: Diagnosis not present

## 2019-03-12 DIAGNOSIS — Z7901 Long term (current) use of anticoagulants: Secondary | ICD-10-CM | POA: Diagnosis not present

## 2019-03-12 DIAGNOSIS — E7800 Pure hypercholesterolemia, unspecified: Secondary | ICD-10-CM | POA: Diagnosis not present

## 2019-03-12 DIAGNOSIS — Z87891 Personal history of nicotine dependence: Secondary | ICD-10-CM | POA: Diagnosis not present

## 2019-03-12 DIAGNOSIS — G4733 Obstructive sleep apnea (adult) (pediatric): Secondary | ICD-10-CM | POA: Diagnosis not present

## 2019-03-12 DIAGNOSIS — E118 Type 2 diabetes mellitus with unspecified complications: Secondary | ICD-10-CM | POA: Diagnosis not present

## 2019-03-12 NOTE — Progress Notes (Signed)
Daily Session Note  Patient Details  Name: Caide Campi MRN: [id number] Date of Birth: 08/13/1955 Referring Provider:     Cardiac Rehab from 03/06/2019 in [facility] Cardiac and Pulmonary Rehab  Referring Provider  Adrian Prows MD      Encounter Date: 03/12/2019  Check In: Session Check In - 03/12/19 1551      Check-In   Supervising physician immediately available to respond to emergencies  See telemetry face sheet for immediately available ER MD    Location  [facility]-Cardiac & Pulmonary Rehab    Staff Present  Renita Papa, RN Moises Blood, BS, ACSM CEP, Exercise Physiologist;Joseph Foy [place], [place], ACSM CEP, Exercise Physiologist    Virtual Visit  No    Medication changes reported      No    Fall or balance concerns reported     No    Warm-up and Cool-down  Performed on first and last piece of equipment    Resistance Training Performed  Yes    VAD Patient?  No    PAD/SET Patient?  No      Pain Assessment   Currently in Pain?  No/denies          Social History   Tobacco Use  Smoking Status Former Smoker  . Packs/day: 0.25  . Years: 27.00  . Pack years: 6.75  . Types: Cigars  . Quit date: 2002  . Years since quitting: 19.1  Smokeless Tobacco Former Systems developer  . Quit date: 2002    Goals Met:  Independence with exercise equipment Exercise tolerated well No report of cardiac concerns or symptoms Strength training completed today  Goals Unmet:  Not Applicable  Comments: Pt able to follow exercise prescription today without complaint.  Will continue to monitor for progression.    Dr. Emily Filbert is Medical Director for [place] and [facility].

## 2019-03-19 ENCOUNTER — Encounter: Payer: Medicare Other | Admitting: *Deleted

## 2019-03-19 ENCOUNTER — Other Ambulatory Visit: Payer: Self-pay

## 2019-03-19 DIAGNOSIS — I214 Non-ST elevation (NSTEMI) myocardial infarction: Secondary | ICD-10-CM

## 2019-03-19 DIAGNOSIS — Z9861 Coronary angioplasty status: Secondary | ICD-10-CM

## 2019-03-19 DIAGNOSIS — G4733 Obstructive sleep apnea (adult) (pediatric): Secondary | ICD-10-CM | POA: Diagnosis not present

## 2019-03-19 DIAGNOSIS — Z7982 Long term (current) use of aspirin: Secondary | ICD-10-CM | POA: Diagnosis not present

## 2019-03-19 DIAGNOSIS — E7800 Pure hypercholesterolemia, unspecified: Secondary | ICD-10-CM | POA: Diagnosis not present

## 2019-03-19 DIAGNOSIS — Z79899 Other long term (current) drug therapy: Secondary | ICD-10-CM | POA: Diagnosis not present

## 2019-03-19 DIAGNOSIS — I110 Hypertensive heart disease with heart failure: Secondary | ICD-10-CM | POA: Diagnosis not present

## 2019-03-19 DIAGNOSIS — Z87891 Personal history of nicotine dependence: Secondary | ICD-10-CM | POA: Diagnosis not present

## 2019-03-19 DIAGNOSIS — Z7901 Long term (current) use of anticoagulants: Secondary | ICD-10-CM | POA: Diagnosis not present

## 2019-03-19 DIAGNOSIS — Z794 Long term (current) use of insulin: Secondary | ICD-10-CM | POA: Diagnosis not present

## 2019-03-19 DIAGNOSIS — I5042 Chronic combined systolic (congestive) and diastolic (congestive) heart failure: Secondary | ICD-10-CM | POA: Diagnosis not present

## 2019-03-19 DIAGNOSIS — E118 Type 2 diabetes mellitus with unspecified complications: Secondary | ICD-10-CM | POA: Diagnosis not present

## 2019-03-19 NOTE — Progress Notes (Signed)
Daily Session Note  Patient Details  Name: Martin Bush MRN: [id number] Date of Birth: Dec 26, 1955 Referring Provider:     Cardiac Rehab from 03/06/2019 in [facility] Cardiac and Pulmonary Rehab  Referring Provider  Adrian Prows MD      Encounter Date: 03/19/2019  Check In: Session Check In - 03/19/19 1543      Check-In   Supervising physician immediately available to respond to emergencies  See telemetry face sheet for immediately available ER MD    Location  [facility]-Cardiac & Pulmonary Rehab    Staff Present  Earlean Shawl, BS, ACSM CEP, Exercise Physiologist;Billy Turvey Sherryll Burger, RN BSN;Joseph Hood RCP,RRT,BSRT    Virtual Visit  No    Medication changes reported      No    Fall or balance concerns reported     No    Warm-up and Cool-down  Performed on first and last piece of equipment    Resistance Training Performed  Yes    VAD Patient?  No    PAD/SET Patient?  No      Pain Assessment   Currently in Pain?  No/denies          Social History   Tobacco Use  Smoking Status Former Smoker  . Packs/day: 0.25  . Years: 27.00  . Pack years: 6.75  . Types: Cigars  . Quit date: 2002  . Years since quitting: 19.1  Smokeless Tobacco Former Systems developer  . Quit date: 2002    Goals Met:  Independence with exercise equipment Exercise tolerated well No report of cardiac concerns or symptoms Strength training completed today  Goals Unmet:  Not Applicable  Comments: Pt able to follow exercise prescription today without complaint.  Will continue to monitor for progression.    Dr. Emily Filbert is Medical Director for [place] and [facility].

## 2019-03-20 ENCOUNTER — Encounter: Payer: Self-pay | Admitting: *Deleted

## 2019-03-20 ENCOUNTER — Other Ambulatory Visit: Payer: Self-pay | Admitting: *Deleted

## 2019-03-20 NOTE — Patient Outreach (Addendum)
[facility] [place] Telephone Outreach PCP completes Transition of Care follow up post-hospital discharge Post-hospital discharge day # 68 without unplanned hospital readmission  03/20/2019  Martin Bush [birth year] [id number]  Successful telephone outreach to Martin Bush, 64 y/o male referred 01/12/2019 to [place] by [facility]iaison after recent hospitalization December 9- 17, 2020 for acute pulmonary edema/ NSTEMI with native vessel disease and stent re-stenosis; patient had cardiac catherization 01/08/2019 and eventual PCI/ angioplasty.Patient was discharged home to self care withouthome health services in place. 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  HIPAA/ identity verified with patient; patient reports "doing about the same, just fine," and he denies pain and new/ recent falls. Patient sounds to be in no distress throughout call today.  Patient further reports:  -- no medicationconcerns; no recent changes to medications; continues self-managing medications; patient able to verbalize accurate understanding of current insulin doses; medications were reviewed with patient today  -- reviewed with patient recent/ upcoming provider appointments;  verbalizes accurate understanding of upcoming appointments and plans to attend all- continues driving self; wife assists if indicated ---- cardiology hospital follow up appointment 02/21/19: attended/ no changes to overall plan of care; cardiac rehabilitation in place ---- March 15, 2019: virtual appointment with PCP (through [place])- attended; has follow up in person appointment for lab work and office evaluation scheduled 03/27/19  Self-health management ofCHF/ CAD/ DM: --continuesusinghome O2 at 2 L/min "at night only;" also uses with CPAP; has not needed to use home  O2 except at night, post-hospital discharge per patient report; adherent to CPAP- denies problems with either -- has continued daily weight monitoring/ recording at home; reviewed with patient daily recorded weights and today he reports weights consistently between 214-216 lbs-- noted this was a significant increase from last reported outreach 02/20/19, at which time her reported daily weights between 205-206 pounds; patient states that he has not experienced weight gain > 3 lbs overnight/ 5 lbs in one week, and states that the weight "has just gradually increased."  ---- using teach back method, reviewed weight gain guidelines in setting of CHF, along with corresponding action plan; patient remains able to independently verbalize without prompting; he declines telephone review of specific weight gain according to his calendar or recorded weights at home ---- noted upcoming scheduled provider office visit with CHF clinic: encouraged patient to physically take his recorded weights from home monitoring to office visit with him for review by NP- patient verbalizes agreement and states he will do -- has begun participating in cardiac rehabilitation 2 times per week; soon to be increased to 3 times per week; states "going just fine" and reports that he "has no problem" tolerating activity of cardiac rehabilitation exercises   -- continues monitoring/ recording blood sugars at homeand denies issues and concerns around blood sugars, again reports "they are still lower than they have been in the past." Patient declines review of specific blood sugar values recorded at home, but tells me that his last recorded fasting blood sugar (yesterday morning) was "114" ---- does not offer specific values for post-prandial blood sugar ranges; just states "they look better than they did before" ---- expects to have next A1-C value checked on 03/27/19 by care provider at [facility] that manages his DM ---- re-reviewed with patient his  A1-C trends over last year and using teach back  method, provided education around how blood sugar values at home correlate to A1-C values 2020 A1-C values noted: 14.7 in October 2020 and 11.6 in December 2020 ---- endorses ongoing adherence to dietary strategies as previously discussed with patient; patient again declines desire to have additional education around dietary management of high blood sugar   Patient denies further issues, concerns, or problems today. Iconfirmed that patient hasmy direct phone number, the main [facility] CM office phone number, and the [facility] CM 24-hour nurse advice phone number should issues arise prior to next scheduled [name] outreach next month. Encouraged patient to contact me directly if needs, questions, issues, or concerns arise prior to next scheduled outreach; patient agreed to do so.  Plan:  Patient will take medications as prescribed and will attend all scheduled provider appointments  Patient will promptly notify care providers for any new concerns/ issues/ problems that arise  Patient willcontinuemonitoring/ recording daily weights at home and will follow weight gain guidelines and corresponding action plan for weight gain  I will send CHF clinic NP provider update via secure messaging through EMR re: patient's reported weight gain as FYI  Patient will continue following heart healthy and diabetic diet  Patient will continue monitoring and recording blood sugars at home  I will place printed educational material in mail to patient around correlation of A1-C values to blood sugars at home and complications of chronic high blood sugars  [facility] CM outreach to continue with scheduled phone callnext month  [facility] CM Care Plan Problem Two     Most Recent Value  Care Plan Problem Two  Ongoing reinforcement of self-health management of chronic disease states of CHF and Diabetes, as evidenced by patient reporting   Role Documenting the  Problem Two  Care Management Coordinator  Care Plan for Problem Two  Active  Interventions for Problem Two Long Term Goal   Discussed with patient his recent daily weights and increase in reported weight over last month,  enocuraged him to take actual recorded weights to upcoming CHF clinic appointment and provided care coordination outreach to CHF clinic provider to inform of patient's reported weight gain over last month,  confirmed that patient has not had weight gain > 3 lbs overnight/ 5 lbs in a week and reiterated previously provided education around weight gain guidelines/ action plan for CHF,  reviewed recently attended cardiology provider appointment with patient and current medications,  confirmed that patient has no current medication concerns  [facility] Long Term Goal  Over the next 60 days, patient will continue monitoring and recording daily weights and blood sugars at home, as evidenced by patient reporting and review of same during [facility] RN CM outreach  [facility] Long Term Goal Start Date  02/20/19  [facility] CM Short Term Goal #1   Over the next 30 days, patient will continue actively participating in cardiac rehabilitation as evidenced by patient reporting and review of EMR as indicated during [facility] RN CM outreach  [facility] CM Short Term Goal #1 Start Date  03/20/19  Interventions for Short Term Goal #2   Confirmed that patient has been actively participating in cardiac rehabilitation as scheduled,  confirmed that patient is tolerating without reported problems,  encouragement and positive reinforcement provided,  discussed benefits of ongoing heart healthy activity in managing CHF  [facility] CM Short Term Goal #2   Over the next 30 days, patient will verbalize most recent A1-C level as evidenced by patient reporting and review of EMR during [facility] RN  CM outreach  [facility] CM Short Term Goal #2 Start Date  03/20/19  Interventions for Short Term Goal #2  Using teach back method, reviewed/ discussed with patient his A1-C values  over last year and confirmed that he hremains able to independently verbalize significance of A1-C test,  confirmed that he continues monitoring and recording blood sugars at home,  placed printed educational material in mail to patient around correlation of A1-C to blood sugars at home and complications of high blood sugars over time     Oneta Rack, RN, BSN, [place] [facility]  [phone]   [place] Telephone Outreach Care Coordination  ----- Message ----- From: Knox Royalty, RN Sent: 03/20/2019   2:50 PM EST To: Alisa Graff, FNP Subject: Juluis Rainier- patient reported weight gain              Hi Tina,  Hope you are well.    Wanted to let you know that I spoke to this patient earlier today and am concerned around his reported weight gain over the last month since my last conversation with him 02/20/19...  He has continued daily weight monitoring/ recording at home; today he reports weights consistently between 214-216 lbs-- noted this was a significant increase from last reported outreach 02/20/19, at which time he reported daily weights between 205-206 pounds; patient states that he has not experienced weight gain > 3 lbs overnight/ 5 lbs in one week, and states that the weight "has just gradually increased."   He did not wish to review the recorded daily weights from his calendar with me and I encouraged him to bring recorded home values to his scheduled appointment with you on Monday 3/01, so you could verify/ assess.  He understands weight gain guidelines in CHF, and did not sound to be in distress today.... but I thought you would want to know about his reported weight gain today.  He denied SOB/ increased swelling, etc.... so this is just an [place]  Let me know if you have any questions around my conversation with him today.  Thanks,  Oneta Rack, RN, BSN, [employer] Coordinator [facility]  Management  [phone]

## 2019-03-21 ENCOUNTER — Encounter: Payer: Self-pay | Admitting: *Deleted

## 2019-03-21 DIAGNOSIS — I214 Non-ST elevation (NSTEMI) myocardial infarction: Secondary | ICD-10-CM

## 2019-03-21 DIAGNOSIS — Z9861 Coronary angioplasty status: Secondary | ICD-10-CM

## 2019-03-21 NOTE — Progress Notes (Signed)
Cardiac Individual Treatment Plan  Patient Details  Name: Dyer Klug MRN: [id number] Date of Birth: 1955/05/01 Referring Provider:     Cardiac Rehab from 03/06/2019 in [facility] Cardiac and Pulmonary Rehab  Referring Provider  Adrian Prows MD      Initial Encounter Date:    Cardiac Rehab from 03/06/2019 in [facility] Cardiac and Pulmonary Rehab  Date  03/06/19      Visit Diagnosis: NSTEMI (non-ST elevated myocardial infarction) ([place])  S/P PTCA (percutaneous transluminal coronary angioplasty)  Patient's Home Medications on Admission:  Current Outpatient Medications:  .  acetaminophen (TYLENOL) 325 MG tablet, Take 2 tablets (650 mg total) by mouth every 4 (four) hours as needed for headache or mild pain., Disp:  , Rfl:  .  amiodarone (PACERONE) 400 MG tablet, Take 0.5 tablets (200 mg total) by mouth daily., Disp:  , Rfl:  .  aspirin EC 81 MG EC tablet, Take 1 tablet (81 mg total) by mouth daily., Disp:  , Rfl:  .  carvedilol (COREG) 6.25 MG tablet, Take 1 tablet (6.25 mg total) by mouth 2 (two) times daily with a meal., Disp:  , Rfl:  .  Cholecalciferol (VITAMIN D3) 50 MCG (2000 UT) TABS, Take by mouth 2 (two) times daily., Disp: , Rfl:  .  empagliflozin (JARDIANCE) 25 MG TABS tablet, Take 25 mg by mouth daily., Disp: , Rfl:  .  ezetimibe (ZETIA) 10 MG tablet, Take 1 tablet (10 mg total) by mouth at bedtime., Disp:  , Rfl:  .  furosemide (LASIX) 40 MG tablet, Take 40 mg by mouth daily., Disp: , Rfl:  .  insulin aspart protamine- aspart (NOVOLOG MIX 70/30) (70-30) 100 UNIT/ML injection, Inject 0.25 mLs (25 Units total) into the skin 2 (two) times daily with a meal. (Patient taking differently: Inject 63 Units into the skin 2 (two) times daily with a meal. ), Disp: 10 mL, Rfl: 11 .  isosorbide mononitrate (IMDUR) 30 MG 24 hr tablet, Take 1 tablet (30 mg total) by mouth daily., Disp:  , Rfl:  .  liraglutide (VICTOZA) 18 MG/3ML SOPN, Inject 1.8 mg into the skin daily., Disp: , Rfl:  .  metFORMIN  (GLUCOPHAGE) 500 MG tablet, Take 500 mg by mouth 2 (two) times daily with a meal. Patient reports prescribed  4 times per day with meals- this is how patient report taking, Disp: , Rfl:  .  nitroGLYCERIN (NITROSTAT) 0.4 MG SL tablet, Place 1 tablet (0.4 mg total) under the tongue every 5 (five) minutes x 3 doses as needed for chest pain., Disp:  , Rfl: 12 .  Omega-3 Fatty Acids (FISH OIL) 1000 MG CAPS, Take 1,000 mg by mouth 2 (two) times daily. , Disp: , Rfl:  .  potassium chloride SA (KLOR-CON) 20 MEQ tablet, Take 1 tablet (20 mEq total) by mouth 2 (two) times daily., Disp:  , Rfl:  .  PRESCRIPTION MEDICATION, Inhale into the lungs at bedtime. CPAP, Disp: , Rfl:  .  rosuvastatin (CRESTOR) 40 MG tablet, Take 1 tablet (40 mg total) by mouth at bedtime., Disp:  , Rfl:  .  sacubitril-valsartan (ENTRESTO) 97-103 MG, Take 1 tablet by mouth 2 (two) times daily., Disp: , Rfl:  .  sertraline (ZOLOFT) 50 MG tablet, Take 1 tablet (50 mg total) by mouth at bedtime., Disp:  , Rfl:  .  ticagrelor (BRILINTA) 90 MG TABS tablet, Take 1 tablet (90 mg total) by mouth 2 (two) times daily., Disp: 60 tablet, Rfl:   Past Medical History: Past  Medical History:  Diagnosis Date  . AICD (automatic cardioverter/defibrillator) present    Medtronic  . Arthritis    "thumbs"  (08/02/2017)  . CHF (congestive heart failure) ([place])   . Chronic combined systolic and diastolic heart failure ([place])    a. 08/2017 Echo: EF 20-25%, mod glob HK. Sev distal ant sept, inflat HK. Apical AK. Gr2 DD. Mildly reduced RV fxn.  . Colon polyps   . Coronary artery disease    a. s/p CABG x 2 (LIMA->LAD, VG->OM); b. Multiple PCI's to LM/LCX/OM; c. 07/2017 PTCA of LM/LCX w/ early ISR-->repeat PCI/DES to LM (3.5x20 Synergy DES) and LCX (3.0x20 Synergy DES); d. 07/2018 Relook Cath: LM patent stent, LAD 100ost, LCX patent stent, OM1 99/60, LIMA->LAD ok. VG->dLCX 64 (old).  . Depression   . Encounter for assessment of implantable  cardioverter-defibrillator (ICD) 09/27/2018  . High cholesterol   . Hypertension   . ICD; Biventricular  Medtronic ICD Amplia MRI QWuad CRT-D  in situ 10/29/14 10/29/2014   Remote ICD check 09.23.20:  One 6 beat NSVT. No therapy.  1 SVT episode @ 130 bpm (38 Sec).  There were 23 Vent sense episodes detected for up to 1.1 min/day (AT). Health trends (patient activity, heart rate variability, average heart rates) are stable.Trans-thoracic impedance trends and the OptiVol Fluid Index do no present significant abnormalities. Battery longevity is 4.3 years. RA pacing is 3  . Ischemic cardiomyopathy 09/27/2018  . MI (myocardial infarction) ([place]) 2003  . NSTEMI (non-ST elevated myocardial infarction) ([place]) 07/16/2014  . OSA on CPAP   . Oxygen deficiency   . Pneumonia 10/2014  . Proteinuria   . Sleep apnea   . 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 Packs/day: 0.25  . Years: 27.00  . Pack years: 6.75  . Types: Cigars  . Quit date: 2002  . Years since quitting: 19.1  Smokeless Tobacco Former Systems developer  . Quit date: 2002    Labs: Recent Review Flowsheet Data    Labs for ITP Cardiac and Pulmonary Rehab Latest Ref Rng & Units 10/30/2018 11/21/2018 12/27/2018 01/03/2019 01/04/2019   Cholestrol 0 - 200 mg/dL 144 - 103 - 142   LDLCALC 0 - 99 mg/dL - - 34 - 67   LDLDIRECT mg/dL 71.0 - - - -   HDL >40 mg/dL 39.60 - 32.10(L) - 45   Trlycerides <150 mg/dL 240.0(H) - 182.0(H) - 148   Hemoglobin A1c 4.6 - 6.5 % - 14.7 11.6(H) - -   PHART 7.350 - 7.450 - - - - -   PCO2ART 32.0 - 48.0 mmHg - - - - -   HCO3 20.0 - 28.0 mmol/L - - - 22.9 -   TCO2 22 - 32 mmol/L - - - - -   ACIDBASEDEF 0.0 - 2.0 mmol/L - - - 5.5(H) -   O2SAT % - - - 92.7 -       Exercise Target Goals: Exercise Program Goal: Individual exercise prescription set using results from initial 6 min walk test and THRR while considering  patient's  activity barriers and safety.   Exercise Prescription Goal: Initial exercise prescription builds to 30-45 minutes a day of aerobic activity, 2-3 days per week.  Home exercise guidelines will be given to patient during program as part of exercise prescription that the participant will acknowledge.  Activity Barriers & Risk Stratification: Activity Barriers & Cardiac Risk  Stratification - 03/06/19 1527      Activity Barriers & Cardiac Risk Stratification   Activity Barriers  Arthritis;Back Problems;Deconditioning;Muscular Weakness;Joint Problems;Balance Concerns   arth is thumbs and back   Cardiac Risk Stratification  High       6 Minute Walk: 6 Minute Walk    Row Name 03/06/19 1526         6 Minute Walk   Phase  Initial     Distance  1325 feet     Walk Time  6 minutes     # of Rest Breaks  0     MPH  2.51     METS  3.18     RPE  13     Perceived Dyspnea   1     VO2 Peak  11.15     Symptoms  Yes (comment)     Comments  SOB     Resting HR  65 bpm     Resting BP  124/70     Resting Oxygen Saturation   97 %     Exercise Oxygen Saturation  during 6 min walk  98 %     Max Ex. HR  110 bpm     Max Ex. BP  138/74     2 Minute Post BP  128/74        Oxygen Initial Assessment:   Oxygen Re-Evaluation:   Oxygen Discharge (Final Oxygen Re-Evaluation):   Initial Exercise Prescription: Initial Exercise Prescription - 03/06/19 1500      Date of Initial Exercise RX and Referring Provider   Date  03/06/19    Referring Provider  Adrian Prows MD      Treadmill   MPH  2.3    Grade  0.5    Minutes  15    METs  2.9      NuStep   Level  2    SPM  80    Minutes  15    METs  2      Biostep-RELP   Level  2    SPM  50    Minutes  15    METs  2      Prescription Details   Frequency (times per week)  3    Duration  Progress to 30 minutes of continuous aerobic without signs/symptoms of physical distress      Intensity   THRR 40-80% of Max Heartrate  102-139    Ratings  of Perceived Exertion  11-13    Perceived Dyspnea  0-4      Progression   Progression  Continue to progress workloads to maintain intensity without signs/symptoms of physical distress.      Resistance Training   Training Prescription  Yes    Weight  3 lb       Perform Capillary Blood Glucose checks as needed.  Exercise Prescription Changes: Exercise Prescription Changes    Row Name 03/06/19 1500 03/15/19 0700           Response to Exercise   Blood Pressure (Admit)  124/70  98/60      Blood Pressure (Exercise)  138/74  144/60      Blood Pressure (Exit)  128/74  90/52      Heart Rate (Admit)  65 bpm  82 bpm      Heart Rate (Exercise)  110 bpm  92 bpm      Heart Rate (Exit)  75 bpm  71 bpm  Oxygen Saturation (Admit)  97 %  --      Oxygen Saturation (Exercise)  98 %  --      Rating of Perceived Exertion (Exercise)  13  15      Perceived Dyspnea (Exercise)  1  --      Symptoms  SOB  back pain on TM      Comments  walk test results  second full day of exericse      Duration  --  Continue with 30 min of aerobic exercise without signs/symptoms of physical distress.      Intensity  --  THRR unchanged        Progression   Progression  --  Continue to progress workloads to maintain intensity without signs/symptoms of physical distress.      Average METs  --  2.38        Resistance Training   Training Prescription  --  Yes      Weight  --  3 lb      Reps  --  10-15        Interval Training   Interval Training  --  No        Treadmill   MPH  --  1.5      Grade  --  0.5      Minutes  --  15      METs  --  2.25        NuStep   Level  --  2      Minutes  --  15      METs  --  2.9        Biostep-RELP   Level  --  2      Minutes  --  15      METs  --  2         Exercise Comments:   Exercise Goals and Review: Exercise Goals    Row Name 03/06/19 1538             Exercise Goals   Increase Physical Activity  Yes       Intervention  Provide advice,  education, support and counseling about physical activity/exercise needs.;Develop an individualized exercise prescription for aerobic and resistive training based on initial evaluation findings, risk stratification, comorbidities and participant's personal goals.       Expected Outcomes  Short Term: Attend rehab on a regular basis to increase amount of physical activity.;Long Term: Add in home exercise to make exercise part of routine and to increase amount of physical activity.;Long Term: Exercising regularly at least 3-5 days a week.       Increase Strength and Stamina  Yes       Intervention  Provide advice, education, support and counseling about physical activity/exercise needs.;Develop an individualized exercise prescription for aerobic and resistive training based on initial evaluation findings, risk stratification, comorbidities and participant's personal goals.       Expected Outcomes  Short Term: Increase workloads from initial exercise prescription for resistance, speed, and METs.;Short Term: Perform resistance training exercises routinely during rehab and add in resistance training at home;Long Term: Improve cardiorespiratory fitness, muscular endurance and strength as measured by increased METs and functional capacity (6MWT)       Able to understand and use rate of perceived exertion (RPE) scale  Yes       Intervention  Provide education and explanation on how to use RPE scale       Expected  Outcomes  Short Term: Able to use RPE daily in rehab to express subjective intensity level;Long Term:  Able to use RPE to guide intensity level when exercising independently       Able to understand and use Dyspnea scale  Yes       Intervention  Provide education and explanation on how to use Dyspnea scale       Expected Outcomes  Short Term: Able to use Dyspnea scale daily in rehab to express subjective sense of shortness of breath during exertion;Long Term: Able to use Dyspnea scale to guide intensity  level when exercising independently       Knowledge and understanding of Target Heart Rate Range (THRR)  Yes       Intervention  Provide education and explanation of THRR including how the numbers were predicted and where they are located for reference       Expected Outcomes  Short Term: Able to state/look up THRR;Short Term: Able to use daily as guideline for intensity in rehab;Long Term: Able to use THRR to govern intensity when exercising independently       Able to check pulse independently  Yes       Intervention  Provide education and demonstration on how to check pulse in carotid and radial arteries.;Review the importance of being able to check your own pulse for safety during independent exercise       Expected Outcomes  Short Term: Able to explain why pulse checking is important during independent exercise;Long Term: Able to check pulse independently and accurately       Understanding of Exercise Prescription  Yes       Intervention  Provide education, explanation, and written materials on patient's individual exercise prescription       Expected Outcomes  Short Term: Able to explain program exercise prescription;Long Term: Able to explain home exercise prescription to exercise independently          Exercise Goals Re-Evaluation : Exercise Goals Re-Evaluation    Row Name 03/08/19 1624 03/15/19 0743           Exercise Goal Re-Evaluation   Exercise Goals Review  Increase Physical Activity;Able to understand and use rate of perceived exertion (RPE) scale;Knowledge and understanding of Target Heart Rate Range (THRR);Understanding of Exercise Prescription;Increase Strength and Stamina;Able to check pulse independently  Increase Physical Activity;Increase Strength and Stamina;Understanding of Exercise Prescription      Comments  Reviewed RPE scale, THR and program prescription with pt today.  Pt voiced understanding and was given a copy of goals to take home.  Jerald is off to a good start in  rehab.  He has completed his first two full days of exercise.  He was only able to get 8 min on the treadmill.  We will continue to work with him on this.  We will continue to monitor  his progress.      Expected Outcomes  Short: Use RPE daily to regulate intensity. Long: Follow program prescription in THR.  Short: Continue to attend rehab regularly  Long: Continue to improve stamina.         Discharge Exercise Prescription (Final Exercise Prescription Changes): Exercise Prescription Changes - 03/15/19 0700      Response to Exercise   Blood Pressure (Admit)  98/60    Blood Pressure (Exercise)  144/60    Blood Pressure (Exit)  90/52    Heart Rate (Admit)  82 bpm    Heart Rate (Exercise)  92 bpm  Heart Rate (Exit)  71 bpm    Rating of Perceived Exertion (Exercise)  15    Symptoms  back pain on TM    Comments  second full day of exericse    Duration  Continue with 30 min of aerobic exercise without signs/symptoms of physical distress.    Intensity  THRR unchanged      Progression   Progression  Continue to progress workloads to maintain intensity without signs/symptoms of physical distress.    Average METs  2.38      Resistance Training   Training Prescription  Yes    Weight  3 lb    Reps  10-15      Interval Training   Interval Training  No      Treadmill   MPH  1.5    Grade  0.5    Minutes  15    METs  2.25      NuStep   Level  2    Minutes  15    METs  2.9      Biostep-RELP   Level  2    Minutes  15    METs  2       Nutrition:  Target Goals: Understanding of nutrition guidelines, daily intake of sodium <1552m, cholesterol <2093m calories 30% from fat and 7% or less from saturated fats, daily to have 5 or more servings of fruits and vegetables.  Biometrics: Pre Biometrics - 03/06/19 1606      Pre Biometrics   Height  5' 7.75" (1.721 m)    Weight  213 lb 3.2 oz (96.7 kg)    BMI (Calculated)  32.65    Single Leg Stand  3.19 seconds        Nutrition  Therapy Plan and Nutrition Goals:   Nutrition Assessments: Nutrition Assessments - 03/06/19 1607      MEDFICTS Scores   Pre Score  42       Nutrition Goals Re-Evaluation:   Nutrition Goals Discharge (Final Nutrition Goals Re-Evaluation):   Psychosocial: Target Goals: Acknowledge presence or absence of significant depression and/or stress, maximize coping skills, provide positive support system. Participant is able to verbalize types and ability to use techniques and skills needed for reducing stress and depression.   Initial Review & Psychosocial Screening: Initial Psych Review & Screening - 02/28/19 1109      Initial Review   Current issues with  History of Depression;Current Psychotropic Meds;Current Stress Concerns    Source of Stress Concerns  Chronic Illness    Comments  No current symptoms of depression on Zoloft and feels that it is working well for him, significant health history      [place] Yes   wife     Barriers   Psychosocial barriers to participate in program  The patient should benefit from training in stress management and relaxation.;Psychosocial barriers identified (see note)      Screening Interventions   Interventions  Encouraged to exercise;To provide support and resources with identified psychosocial needs;Provide feedback about the scores to participant    Expected Outcomes  Long Term Goal: Stressors or current issues are controlled or eliminated.;Short Term goal: Identification and review with participant of any Quality of Life or Depression concerns found by scoring the questionnaire.;Short Term goal: Utilizing psychosocial counselor, staff and physician to assist with identification of specific Stressors or current issues interfering with healing process. Setting desired goal for each stressor or current issue identified.;Long  Term goal: The participant improves quality of Life and PHQ9 Scores as seen by post scores and/or  verbalization of changes       Quality of Life Scores:  Quality of Life - 03/06/19 1607      Quality of Life   Select  Quality of Life      Quality of Life Scores   Health/Function Pre  20.57 %    Socioeconomic Pre  27 %    Psych/Spiritual Pre  25.5 %    Family Pre  30 %    GLOBAL Pre  24.41 %      Scores of 19 and below usually indicate a poorer quality of life in these areas.  A difference of  2-3 points is a clinically meaningful difference.  A difference of 2-3 points in the total score of the Quality of Life Index has been associated with significant improvement in overall quality of life, self-image, physical symptoms, and general health in studies assessing change in quality of life.  PHQ-9: Recent Review Flowsheet Data    Depression screen [facility] 2/9 03/06/2019 01/15/2019 12/04/2018 09/29/2018 08/30/2018   Decreased Interest 0 0 0 0 0   Down, Depressed, Hopeless 0 0 0 0 0   PHQ - 2 Score 0 0 0 0 0   Altered sleeping 1 - - 0 -   Tired, decreased energy 0 - - 0 -   Change in appetite 0 - - 0 -   Feeling bad or failure about yourself  0 - - 0 -   Trouble concentrating 0 - - 0 -   Moving slowly or fidgety/restless 0 - - 0 -   Suicidal thoughts 0 - - 0 -   PHQ-9 Score 1 - - 0 -   Difficult doing work/chores Not difficult at all - - Not difficult at all -     Interpretation of Total Score  Total Score Depression Severity:  1-4 = Minimal depression, 5-9 = Mild depression, 10-14 = Moderate depression, 15-19 = Moderately severe depression, 20-27 = Severe depression   Psychosocial Evaluation and Intervention: Psychosocial Evaluation - 02/28/19 1115      Psychosocial Evaluation & Interventions   Interventions  Encouraged to exercise with the program and follow exercise prescription    Comments  Mohammad 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  He has a Estate manager/land agent in place.  He also has uncontrolled  diabetes according to office notes, but feels that he is doing well overall.  His wife is his primary support system and they try not to have too many worries.  He is looking forward to getting his heart stronger and back in better shape overall.    Expected Outcomes  Short: Attend rehab to rebuild stamina again.  Long: Continue to cope well with his health.    Continue Psychosocial Services   Follow up required by staff       Psychosocial Re-Evaluation:   Psychosocial Discharge (Final Psychosocial Re-Evaluation):   Vocational Rehabilitation: Provide vocational rehab assistance to qualifying candidates.   Vocational Rehab Evaluation & Intervention: Vocational Rehab - 02/28/19 1109      Initial Vocational Rehab Evaluation & Intervention   Assessment shows need for Vocational Rehabilitation  No       Education: Education Goals: Education classes will be provided on a variety of topics geared toward better understanding of heart health and risk factor  modification. Participant will state understanding/return demonstration of topics presented as noted by education test scores.  Learning Barriers/Preferences: Learning Barriers/Preferences - 02/28/19 1108      Learning Barriers/Preferences   Learning Barriers  Sight   glasses   Learning Preferences  None       Education Topics:  AED/CPR: - Group verbal and written instruction with the use of models to demonstrate the basic use of the AED with the basic ABC's of resuscitation.   General Nutrition Guidelines/Fats and Fiber: -Group instruction provided by verbal, written material, models and posters to present the general guidelines for heart healthy nutrition. Gives an explanation and review of dietary fats and fiber.   Controlling Sodium/Reading Food Labels: -Group verbal and written material supporting the discussion of sodium use in heart healthy nutrition. Review and explanation with models, verbal and written materials for  utilization of the food label.   Exercise Physiology & General Exercise Guidelines: - Group verbal and written instruction with models to review the exercise physiology of the cardiovascular system and associated critical values. Provides general exercise guidelines with specific guidelines to those with heart or lung disease.    Aerobic Exercise & Resistance Training: - Gives group verbal and written instruction on the various components of exercise. Focuses on aerobic and resistive training programs and the benefits of this training and how to safely progress through these programs..   Flexibility, Balance, Mind/Body Relaxation: Provides group verbal/written instruction on the benefits of flexibility and balance training, including mind/body exercise modes such as yoga, pilates and tai chi.  Demonstration and skill practice provided.   Stress and Anxiety: - Provides group verbal and written instruction about the health risks of elevated stress and causes of high stress.  Discuss the correlation between heart/lung disease and anxiety and treatment options. Review healthy ways to manage with stress and anxiety.   Depression: - Provides group verbal and written instruction on the correlation between heart/lung disease and depressed mood, treatment options, and the stigmas associated with seeking treatment.   Anatomy & Physiology of the Heart: - Group verbal and written instruction and models provide basic cardiac anatomy and physiology, with the coronary electrical and arterial systems. Review of Valvular disease and Heart Failure   Cardiac Procedures: - Group verbal and written instruction to review commonly prescribed medications for heart disease. Reviews the medication, class of the drug, and side effects. Includes the steps to properly store meds and maintain the prescription regimen. (beta blockers and nitrates)   Cardiac Medications I: - Group verbal and written instruction to  review commonly prescribed medications for heart disease. Reviews the medication, class of the drug, and side effects. Includes the steps to properly store meds and maintain the prescription regimen.   Cardiac Medications II: -Group verbal and written instruction to review commonly prescribed medications for heart disease. Reviews the medication, class of the drug, and side effects. (all other drug classes)    Go Sex-Intimacy & Heart Disease, Get SMART - Goal Setting: - Group verbal and written instruction through game format to discuss heart disease and the return to sexual intimacy. Provides group verbal and written material to discuss and apply goal setting through the application of the S.M.A.R.T. Method.   Other Matters of the Heart: - Provides group verbal, written materials and models to describe Stable Angina and Peripheral Artery. Includes description of the disease process and treatment options available to the cardiac patient.   Exercise & Equipment Safety: - Individual verbal instruction and demonstration of  equipment use and safety with use of the equipment.   Cardiac Rehab from 03/06/2019 in [facility] Cardiac and Pulmonary Rehab  Date  03/06/19  Educator  [facility]  Instruction Review Code  1- Verbalizes Understanding      Infection Prevention: - Provides verbal and written material to individual with discussion of infection control including proper hand washing and proper equipment cleaning during exercise session.   Cardiac Rehab from 03/06/2019 in [facility] Cardiac and Pulmonary Rehab  Date  03/06/19  Educator  [facility]  Instruction Review Code  1- Verbalizes Understanding      Falls Prevention: - Provides verbal and written material to individual with discussion of falls prevention and safety.   Cardiac Rehab from 03/06/2019 in [facility] Cardiac and Pulmonary Rehab  Date  03/06/19  Educator  [facility]  Instruction Review Code  1- Verbalizes Understanding      Diabetes: - Individual verbal and  written instruction to review signs/symptoms of diabetes, desired ranges of glucose level fasting, after meals and with exercise. Acknowledge that pre and post exercise glucose checks will be done for 3 sessions at entry of program.   Know Your Numbers and Risk Factors: -Group verbal and written instruction about important numbers in your health.  Discussion of what are risk factors and how they play a role in the disease process.  Review of Cholesterol, Blood Pressure, Diabetes, and BMI and the role they play in your overall health.   Sleep Hygiene: -Provides group verbal and written instruction about how sleep can affect your health.  Define sleep hygiene, discuss sleep cycles and impact of sleep habits. Review good sleep hygiene tips.    Other: -Provides group and verbal instruction on various topics (see comments)   Knowledge Questionnaire Score: Knowledge Questionnaire Score - 03/06/19 1607      Knowledge Questionnaire Score   Pre Score  24/26 Education Focus: Angina and Nurtition       Core Components/Risk Factors/Patient Goals at Admission: Personal Goals and Risk Factors at Admission - 03/06/19 1608      Core Components/Risk Factors/Patient Goals on Admission    Weight Management  Yes;Weight Loss;Obesity    Intervention  Weight Management: Develop a combined nutrition and exercise program designed to reach desired caloric intake, while maintaining appropriate intake of nutrient and fiber, sodium and fats, and appropriate energy expenditure required for the weight goal.;Weight Management: Provide education and appropriate resources to help participant work on and attain dietary goals.;Weight Management/Obesity: Establish reasonable short term and long term weight goals.;Obesity: Provide education and appropriate resources to help participant work on and attain dietary goals.    Admit Weight  213 lb 3.2 oz (96.7 kg)    Goal Weight: Short Term  208 lb (94.3 kg)    Goal Weight:  Long Term  200 lb (90.7 kg)    Expected Outcomes  Long Term: Adherence to nutrition and physical activity/exercise program aimed toward attainment of established weight goal;Short Term: Continue to assess and modify interventions until short term weight is achieved;Weight Loss: Understanding of general recommendations for a balanced deficit meal plan, which promotes 1-2 lb weight loss per week and includes a negative energy balance of [phone] kcal/d;Understanding recommendations for meals to include 15-35% energy as protein, 25-35% energy from fat, 35-60% energy from carbohydrates, less than 270m of dietary cholesterol, 20-35 gm of total fiber daily;Understanding of distribution of calorie intake throughout the day with the consumption of 4-5 meals/snacks    Diabetes  Yes    Intervention  Provide  education about signs/symptoms and action to take for hypo/hyperglycemia.;Provide education about proper nutrition, including hydration, and aerobic/resistive exercise prescription along with prescribed medications to achieve blood glucose in normal ranges: Fasting glucose 65-99 mg/dL    Expected Outcomes  Short Term: Participant verbalizes understanding of the signs/symptoms and immediate care of hyper/hypoglycemia, proper foot care and importance of medication, aerobic/resistive exercise and nutrition plan for blood glucose control.;Long Term: Attainment of HbA1C < 7%.    Heart Failure  Yes    Intervention  Provide a combined exercise and nutrition program that is supplemented with education, support and counseling about heart failure. Directed toward relieving symptoms such as shortness of breath, decreased exercise tolerance, and extremity edema.    Expected Outcomes  Improve functional capacity of life;Short term: Attendance in program 2-3 days a week with increased exercise capacity. Reported lower sodium intake. Reported increased fruit and vegetable intake. Reports medication compliance.;Short term: Daily  weights obtained and reported for increase. Utilizing diuretic protocols set by physician.;Long term: Adoption of self-care skills and reduction of barriers for early signs and symptoms recognition and intervention leading to self-care maintenance.    Hypertension  Yes    Intervention  Provide education on lifestyle modifcations including regular physical activity/exercise, weight management, moderate sodium restriction and increased consumption of fresh fruit, vegetables, and low fat dairy, alcohol moderation, and smoking cessation.;Monitor prescription use compliance.    Expected Outcomes  Short Term: Continued assessment and intervention until BP is < 140/11m HG in hypertensive participants. < 130/857mHG in hypertensive participants with diabetes, heart failure or chronic kidney disease.    Lipids  Yes    Intervention  Provide education and support for participant on nutrition & aerobic/resistive exercise along with prescribed medications to achieve LDL <7085mHDL >57m18m  Expected Outcomes  Short Term: Participant states understanding of desired cholesterol values and is compliant with medications prescribed. Participant is following exercise prescription and nutrition guidelines.;Long Term: Cholesterol controlled with medications as prescribed, with individualized exercise RX and with personalized nutrition plan. Value goals: LDL < 70mg46mL > 40 mg.       Core Components/Risk Factors/Patient Goals Review:    Core Components/Risk Factors/Patient Goals at Discharge (Final Review):    ITP Comments: ITP Comments    Row Name 02/28/19 1120 03/06/19 1526 03/08/19 1622 03/21/19 0704     ITP Comments  Completed virtual orientation today.  EP eval scheduled for 2/9 at 2pm. [facility].  Completed 6MWT and gym orientation.  Initial ITP created and sent for review to Dr. Mark Emily Filbertical Director.  First full day of exercise!  Patient was oriented to gym  and equipment including functions, settings, policies, and procedures.  Patient's individual exercise prescription and treatment plan were reviewed.  All starting workloads were established based on the results of the 6 minute walk test done at initial orientation visit.  The plan for exercise progression was also introduced and progression will be customized based on patient's performance and goals.  30 day chart review completed. ITP sent to Dr M MilZachery Dakinscal Director, for review,changes as needed and signature.       Comments:

## 2019-03-22 ENCOUNTER — Other Ambulatory Visit: Payer: Self-pay

## 2019-03-22 ENCOUNTER — Encounter: Payer: Medicare Other | Admitting: *Deleted

## 2019-03-22 DIAGNOSIS — I214 Non-ST elevation (NSTEMI) myocardial infarction: Secondary | ICD-10-CM | POA: Diagnosis not present

## 2019-03-22 DIAGNOSIS — E7800 Pure hypercholesterolemia, unspecified: Secondary | ICD-10-CM | POA: Diagnosis not present

## 2019-03-22 DIAGNOSIS — Z7901 Long term (current) use of anticoagulants: Secondary | ICD-10-CM | POA: Diagnosis not present

## 2019-03-22 DIAGNOSIS — Z9861 Coronary angioplasty status: Secondary | ICD-10-CM

## 2019-03-22 DIAGNOSIS — Z794 Long term (current) use of insulin: Secondary | ICD-10-CM | POA: Diagnosis not present

## 2019-03-22 DIAGNOSIS — Z7982 Long term (current) use of aspirin: Secondary | ICD-10-CM | POA: Diagnosis not present

## 2019-03-22 DIAGNOSIS — I5042 Chronic combined systolic (congestive) and diastolic (congestive) heart failure: Secondary | ICD-10-CM | POA: Diagnosis not present

## 2019-03-22 DIAGNOSIS — E118 Type 2 diabetes mellitus with unspecified complications: Secondary | ICD-10-CM | POA: Diagnosis not present

## 2019-03-22 DIAGNOSIS — Z79899 Other long term (current) drug therapy: Secondary | ICD-10-CM | POA: Diagnosis not present

## 2019-03-22 DIAGNOSIS — Z87891 Personal history of nicotine dependence: Secondary | ICD-10-CM | POA: Diagnosis not present

## 2019-03-22 DIAGNOSIS — G4733 Obstructive sleep apnea (adult) (pediatric): Secondary | ICD-10-CM | POA: Diagnosis not present

## 2019-03-22 DIAGNOSIS — I110 Hypertensive heart disease with heart failure: Secondary | ICD-10-CM | POA: Diagnosis not present

## 2019-03-22 LAB — GLUCOSE, CAPILLARY
Glucose-Capillary: 301 mg/dL — ABNORMAL HIGH (ref 70–99)
Glucose-Capillary: 254 mg/dL — ABNORMAL HIGH (ref 70–99)

## 2019-03-22 NOTE — Progress Notes (Signed)
Daily Session Note  Patient Details  Name: Martin Bush MRN: [id number] Date of Birth: 1955-06-04 Referring Provider:     Cardiac Rehab from 03/06/2019 in [facility] Cardiac and Pulmonary Rehab  Referring Provider  Adrian Prows MD      Encounter Date: 03/22/2019  Check In: Session Check In - 03/22/19 1505      Check-In   Supervising physician immediately available to respond to emergencies  See telemetry face sheet for immediately available ER MD    Location  [facility]-Cardiac & Pulmonary Rehab    Staff Present  Nada Maclachlan, BA, ACSM CEP, Exercise Physiologist;Jessica Luan Pulling, MA, RCEP, CCRP, CCET;Caprisha Bridgett Sherryll Burger, RN BSN    Virtual Visit  No    Medication changes reported      No    Fall or balance concerns reported     No    Warm-up and Cool-down  Performed on first and last piece of equipment    Resistance Training Performed  Yes    VAD Patient?  No    PAD/SET Patient?  No      Pain Assessment   Currently in Pain?  No/denies          Social History   Tobacco Use  Smoking Status Former Smoker  . Packs/day: 0.25  . Years: 27.00  . Pack years: 6.75  . Types: Cigars  . Quit date: 2002  . Years since quitting: 19.1  Smokeless Tobacco Former Systems developer  . Quit date: 2002    Goals Met:  Independence with exercise equipment Exercise tolerated well No report of cardiac concerns or symptoms Strength training completed today  Goals Unmet:  Not Applicable  Comments: Pt able to follow exercise prescription today without complaint.  Will continue to monitor for progression.    Dr. Emily Filbert is Medical Director for [place] and [facility].

## 2019-03-24 DIAGNOSIS — Z4502 Encounter for adjustment and management of automatic implantable cardiac defibrillator: Secondary | ICD-10-CM | POA: Diagnosis not present

## 2019-03-24 DIAGNOSIS — I5042 Chronic combined systolic (congestive) and diastolic (congestive) heart failure: Secondary | ICD-10-CM | POA: Diagnosis not present

## 2019-03-24 DIAGNOSIS — Z95810 Presence of automatic (implantable) cardiac defibrillator: Secondary | ICD-10-CM | POA: Diagnosis not present

## 2019-03-26 ENCOUNTER — Ambulatory Visit: Payer: No Typology Code available for payment source | Admitting: Family

## 2019-03-26 NOTE — Progress Notes (Signed)
Patient ID: Martin Bush, male    DOB: Mar 11, 1955, 64 y.o.   MRN: [id number]  HPI  Mr Polczynski is a 64 y/o male with a history of CAD, DM, hyperlipidemia, HTN, depression, obstructive sleep apnea, previous tobacco use and chronic heart failure.   Echo report from 01/04/2019 showed an EF of 25-30%.  Catheterization done and showed:  Ost LAD to Mid LAD lesion is 100% stenosed.  LIMA and is normal in caliber.  The graft exhibits no disease.  Mid LAD lesion is 50% stenosed.  Ost Cx to Prox Cx lesion is 99% stenosed.  Ost LM to Dist LM lesion is 99% stenosed.  3rd Mrg lesion is 60% stenosed.  LPAV lesion is 90% stenosed.   1.  Severe underlying three-vessel coronary artery disease with patent LIMA to LAD.  Chronically occluded SVG to OM.  Severe in-stent restenosis in left main stent extending into the proximal left circumflex.  In addition, there is significant restenosis in the stent placed in the left posterior AV groove artery.  The RCA is known to be small in size and severely diseased. 2.  Left ventricular angiography was not performed due to chronic kidney disease.  LVEDP was 13 mmHg.  Admitted 01/08/2019 due to acute HF and NSTEMI. Cardiothoracic consult obtained. Had coronary angioplasty of the left main and mid circumflex coronary artery 01/11/2019. Discharged after 3 days. Admitted 01/03/2019 due to Cardiology consult obtained. Initially needed bipap. Chest CTA was negative for PE. Initially given IV lasix and heparin. COVID negative. Transferred after 5 days.   He presents today for a follow-up visit with a chief complaint of a dry cough. He says that this has been present for quite some time. He has gradual weight gain along with this. He denies any difficulty sleeping, abdominal distention, palpitations, pedal edema, chest pain, dizziness, shortness of breath or fatigue.   He says that he's been eating more and not moving around as much due to [place] pandemic. Received his  first COVID vaccine earlier today.   Past Medical History:  Diagnosis Date  . AICD (automatic cardioverter/defibrillator) present    Medtronic  . Arthritis    "thumbs"  (08/02/2017)  . CHF (congestive heart failure) ([place])   . Chronic combined systolic and diastolic heart failure ([place])    a. 08/2017 Echo: EF 20-25%, mod glob HK. Sev distal ant sept, inflat HK. Apical AK. Gr2 DD. Mildly reduced RV fxn.  . Colon polyps   . Coronary artery disease    a. s/p CABG x 2 (LIMA->LAD, VG->OM); b. Multiple PCI's to LM/LCX/OM; c. 07/2017 PTCA of LM/LCX w/ early ISR-->repeat PCI/DES to LM (3.5x20 Synergy DES) and LCX (3.0x20 Synergy DES); d. 07/2018 Relook Cath: LM patent stent, LAD 100ost, LCX patent stent, OM1 99/60, LIMA->LAD ok. VG->dLCX 26 (old).  . Depression   . Encounter for assessment of implantable cardioverter-defibrillator (ICD) 09/27/2018  . High cholesterol   . Hypertension   . ICD; Biventricular  Medtronic ICD Amplia MRI QWuad CRT-D  in situ 10/29/14 10/29/2014   Remote ICD check 09.23.20:  One 6 beat NSVT. No therapy.  1 SVT episode @ 130 bpm (38 Sec).  There were 23 Vent sense episodes detected for up to 1.1 min/day (AT). Health trends (patient activity, heart rate variability, average heart rates) are stable.Trans-thoracic impedance trends and the OptiVol Fluid Index do no present significant abnormalities. Battery longevity is 4.3 years. RA pacing is 3  . Ischemic cardiomyopathy 09/27/2018  . MI (myocardial infarction) ([place]) 2003  .  NSTEMI (non-ST elevated myocardial infarction) ([place]) 07/16/2014  . OSA on CPAP   . Oxygen deficiency   . Pneumonia 10/2014  . Proteinuria   . Sleep apnea   . Type II diabetes mellitus ([facility])    insulin dependent   Past Surgical History:  Procedure Laterality Date  . BIOPSY  09/19/2018   Procedure: BIOPSY;  Surgeon: Thornton Park, MD;  Location: [facility];  Service: Gastroenterology;;  . CARDIAC CATHETERIZATION N/A 07/16/2014   Procedure: Left Heart Cath  and Coronary Angiography;  Surgeon: Adrian Prows, MD;  Location: [place] CV LAB;  Service: Cardiovascular;  Laterality: N/A;  . CARDIAC CATHETERIZATION  07/16/2014   Procedure: Coronary Balloon Angioplasty;  Surgeon: Adrian Prows, MD;  Location: [place] CV LAB;  Service: Cardiovascular;;  . CARDIAC CATHETERIZATION  2003  . CARDIAC DEFIBRILLATOR PLACEMENT  2016  . CATARACT EXTRACTION W/ INTRAOCULAR LENS IMPLANT Left 03/2014  . COLONOSCOPY    . COLONOSCOPY WITH PROPOFOL N/A 09/19/2018   Procedure: COLONOSCOPY WITH PROPOFOL;  Surgeon: Thornton Park, MD;  Location: [facility];  Service: Gastroenterology;  Laterality: N/A;  . CORONARY ANGIOPLASTY WITH STENT PLACEMENT     "I've got a total of 8 stents in there; mostly doine at [facility]" (08/02/2017)  . CORONARY ARTERY BYPASS GRAFT  ~ 2003   "CABG X2"; [facility]  . CORONARY ATHERECTOMY N/A 08/16/2017   Procedure: CORONARY ATHERECTOMY;  Surgeon: Nigel Mormon, MD;  Location: [place] CV LAB;  Service: Cardiovascular;  Laterality: N/A;  . CORONARY BALLOON ANGIOPLASTY N/A 08/04/2017   Procedure: CORONARY BALLOON ANGIOPLASTY;  Surgeon: Adrian Prows, MD;  Location: [place] CV LAB;  Service: Cardiovascular;  Laterality: N/A;  . CORONARY BALLOON ANGIOPLASTY N/A 01/11/2019   Procedure: CORONARY BALLOON ANGIOPLASTY;  Surgeon: Adrian Prows, MD;  Location: [place] CV LAB;  Service: Cardiovascular;  Laterality: N/A;  . CORONARY STENT INTERVENTION N/A 08/16/2017   Procedure: CORONARY STENT INTERVENTION;  Surgeon: Nigel Mormon, MD;  Location: [place] CV LAB;  Service: Cardiovascular;  Laterality: N/A;  . ELBOW SURGERY Left ?2001   "pinched nerve"  . ENDOSCOPIC MUCOSAL RESECTION  12/18/2018   Procedure: ENDOSCOPIC MUCOSAL RESECTION;  Surgeon: Rush Landmark Telford Nab., MD;  Location: [place];  Service: Gastroenterology;;  . Otho Darner SIGMOIDOSCOPY N/A 12/18/2018   Procedure: Beryle Quant;  Surgeon: Irving Copas., MD;  Location: [place];  Service: Gastroenterology;  Laterality: N/A;  . HEMOSTASIS CLIP PLACEMENT  12/18/2018   Procedure: HEMOSTASIS CLIP PLACEMENT;  Surgeon: Irving Copas., MD;  Location: [place];  Service: Gastroenterology;;  . LEFT HEART CATH AND CORONARY ANGIOGRAPHY N/A 01/11/2019   Procedure: LEFT HEART CATH AND CORONARY ANGIOGRAPHY;  Surgeon: Adrian Prows, MD;  Location: [place] CV LAB;  Service: Cardiovascular;  Laterality: N/A;  . LEFT HEART CATH AND CORS/GRAFTS ANGIOGRAPHY N/A 08/04/2017   Procedure: LEFT HEART CATH AND CORS/GRAFTS ANGIOGRAPHY;  Surgeon: Adrian Prows, MD;  Location: [place] CV LAB;  Service: Cardiovascular;  Laterality: N/A;  . LEFT HEART CATH AND CORS/GRAFTS ANGIOGRAPHY N/A 08/20/2017   Procedure: LEFT HEART CATH AND CORS/GRAFTS ANGIOGRAPHY;  Surgeon: Nigel Mormon, MD;  Location: [place] CV LAB;  Service: Cardiovascular;  Laterality: N/A;  . LEFT HEART CATH AND CORS/GRAFTS ANGIOGRAPHY N/A 01/08/2019   Procedure: LEFT HEART CATH AND CORS/GRAFTS ANGIOGRAPHY;  Surgeon: Wellington Hampshire, MD;  Location: [place] CV LAB;  Service: Cardiovascular;  Laterality: N/A;  . POLYPECTOMY  09/19/2018   Procedure: POLYPECTOMY;  Surgeon: Thornton Park, MD;  [facility];  Service: Gastroenterology;;  . Lia Foyer INJECTION  09/19/2018   Procedure: SUBMUCOSAL INJECTION;  Surgeon: Thornton Park, MD;  Location: [facility];  Service: Gastroenterology;;  . Lia Foyer LIFTING INJECTION  12/18/2018   Procedure: SUBMUCOSAL LIFTING INJECTION;  Surgeon: Irving Copas., MD;  Location: [facility] ENDOSCOPY;  Service: Gastroenterology;;   Family History  Problem Relation Age of Onset  . Uterine cancer Mother   . Lung cancer Mother   . Hyperlipidemia Father   . Heart disease Father   . Hypertension Father   . Diabetes Father   . Colon cancer Neg Hx   . Esophageal cancer Neg Hx   . Colon polyps Neg Hx   . Rectal cancer  Neg Hx   . Stomach cancer Neg Hx   . Inflammatory bowel disease Neg Hx   . Liver disease Neg Hx   . Pancreatic cancer Neg Hx    Social History   Tobacco Use  . Smoking status: Former Smoker    Packs/day: 0.25    Years: 27.00    Pack years: 6.75    Types: Cigars    Quit date: 2002    Years since quitting: 19.1  . Smokeless tobacco: Former Systems developer    Quit date: 2002  Substance Use Topics  . Alcohol use: Not Currently   Allergies  Allergen Reactions  . Diltiazem Rash   Prior to Admission medications   Medication Sig Start Date End Date Taking? Authorizing Provider  acetaminophen (TYLENOL) 325 MG tablet Take 2 tablets (650 mg total) by mouth every 4 (four) hours as needed for headache or mild pain. 01/08/19  Yes Nolberto Hanlon, MD  amiodarone (PACERONE) 400 MG tablet Take 0.5 tablets (200 mg total) by mouth daily. 01/11/19  Yes Adrian Prows, MD  aspirin EC 81 MG EC tablet Take 1 tablet (81 mg total) by mouth daily. 01/08/19  Yes Nolberto Hanlon, MD  carvedilol (COREG) 6.25 MG tablet Take 1 tablet (6.25 mg total) by mouth 2 (two) times daily with a meal. 01/08/19  Yes Nolberto Hanlon, MD  Cholecalciferol (VITAMIN D3) 50 MCG (2000 UT) TABS Take by mouth 2 (two) times daily.   Yes [provider]  empagliflozin (JARDIANCE) 25 MG TABS tablet Take 25 mg by mouth daily.   Yes [name], MD  ezetimibe (ZETIA) 10 MG tablet Take 1 tablet (10 mg total) by mouth at bedtime. 01/08/19  Yes Nolberto Hanlon, MD  furosemide (LASIX) 40 MG tablet Take 40 mg by mouth daily.   Yes [provider]  insulin aspart protamine- aspart (NOVOLOG MIX 70/30) (70-30) 100 UNIT/ML injection Inject 0.25 mLs (25 Units total) into the skin 2 (two) times daily with a meal. Patient taking differently: Inject 63 Units into the skin 2 (two) times daily with a meal.  01/08/19  Yes Nolberto Hanlon, MD  isosorbide mononitrate (IMDUR) 30 MG 24 hr tablet Take 1 tablet (30 mg total) by mouth daily. 01/08/19  Yes Nolberto Hanlon, MD  liraglutide (VICTOZA) 18 MG/3ML SOPN Inject 1.8 mg into the skin daily.   Yes [provider]  metFORMIN (GLUCOPHAGE) 500 MG tablet Take 500 mg by mouth 2 (two) times daily with a meal. Patient reports prescribed  4 times per day with meals- this is how patient report taking   Yes [name], MD  nitroGLYCERIN (NITROSTAT) 0.4 MG SL tablet Place 1 tablet (0.4 mg total) under the tongue every 5 (five) minutes x 3 doses as needed for chest pain.  01/08/19  Yes Nolberto Hanlon, MD  Omega-3 Fatty Acids (FISH OIL) 1000 MG CAPS Take 1,000 mg by mouth 2 (two) times daily.    Yes [provider]  potassium chloride SA (KLOR-CON) 20 MEQ tablet Take 1 tablet (20 mEq total) by mouth 2 (two) times daily. 01/08/19  Yes Nolberto Hanlon, MD  PRESCRIPTION MEDICATION Inhale into the lungs at bedtime. CPAP   Yes [provider]  rosuvastatin (CRESTOR) 40 MG tablet Take 1 tablet (40 mg total) by mouth at bedtime. 01/08/19  Yes Nolberto Hanlon, MD  sacubitril-valsartan (ENTRESTO) 97-103 MG Take 1 tablet by mouth 2 (two) times daily.   Yes [name], MD  sertraline (ZOLOFT) 50 MG tablet Take 1 tablet (50 mg total) by mouth at bedtime. 01/08/19  Yes Nolberto Hanlon, MD  ticagrelor (BRILINTA) 90 MG TABS tablet Take 1 tablet (90 mg total) by mouth 2 (two) times daily. 01/08/19  Yes Nolberto Hanlon, MD     Review of Systems  Constitutional: Negative for appetite change and fatigue.  HENT: Negative for congestion, postnasal drip and sore throat.   Eyes: Negative.   Respiratory: Positive for cough (dry cough). Negative for shortness of breath.   Cardiovascular: Negative for chest pain, palpitations and leg swelling.  Gastrointestinal: Negative for abdominal distention and abdominal pain.  Endocrine: Negative.   Genitourinary: Negative.   Musculoskeletal: Negative for back pain and neck pain.  Skin: Negative.   Allergic/Immunologic: Negative.   Neurological: Negative for dizziness  and light-headedness.  Hematological: Negative for adenopathy. Does not bruise/bleed easily.  Psychiatric/Behavioral: Negative for dysphoric mood and sleep disturbance (wearing CPAP and oxygen at 2L at bedtime). The patient is not nervous/anxious.    Vitals:   03/27/19 1514  BP: (!) 156/94  Pulse: 81  Resp: 20  SpO2: 99%  Weight: 219 lb (99.3 kg)  Height: 5\' 7"  (1.702 m)   Wt Readings from Last 3 Encounters:  03/27/19 219 lb (99.3 kg)  03/06/19 213 lb 3.2 oz (96.7 kg)  02/21/19 214 lb (97.1 kg)   Lab Results  Component Value Date   CREATININE 1.49 (H) 01/11/2019   CREATININE 1.61 (H) 01/09/2019   CREATININE 1.60 (H) 01/08/2019    Physical Exam Vitals and nursing note reviewed.  Constitutional:      Appearance: Normal appearance.  HENT:     Head: Normocephalic and atraumatic.  Cardiovascular:     Rate and Rhythm: Normal rate and regular rhythm.  Pulmonary:     Effort: Pulmonary effort is normal. No respiratory distress.     Breath sounds: No wheezing or rales.  Abdominal:     General: There is no distension.     Palpations: Abdomen is soft.  Musculoskeletal:        General: No tenderness.     Cervical back: Normal range of motion and neck supple.     Right lower leg: No edema.     Left lower leg: No edema.  Skin:    General: Skin is warm and dry.  Neurological:     General: No focal deficit present.     Mental Status: He is alert and oriented to person, place, and time.  Psychiatric:        Mood and Affect: Mood normal.        Behavior: Behavior normal.     Assessment & Plan:  1: Chronic heart failure with reduced ejection fraction- - NYHA class I - euvolemic today - weighing daily and he was reminded to call  for an overnight weight gain of >2 pounds or a weekly weight gain of >5 pounds - weight up 7 pounds from last visit here 2 months ago but he says that he's eating more than he should - not adding salt; importance of following a low sodium diet was  discussed  - saw cardiology Einar Gip) 02/21/19 - BNP 01/03/2019 was 1262.0 - participating in cardiac rehab - says that he's received his flu vaccine for this season - is now on max dose of entresto  2: HTN- - BP mildly elevated today but he's picked up some weight; encouraged him to watch how much he's eating - saw PCP Caryl Bis) 01/01/2019 - BMP 01/11/2019 reviewed and showed sodium 134, potassium 4.7, creatinine 1.49 and GFR 49  3: DM-  - A1c 12/27/2018 was 11.6% - saw endocrinology Honor Junes) 02/08/19 - nonfasting glucose today in clinic was 208 - patient saw nephrology at the [facility] earlier today and has labs drawn   Patient did not bring his medications nor a list. Each medication was verbally reviewed with the patient and he was encouraged to bring the bottles to every visit to confirm accuracy of list.  Due to stability of HF, will not make a return appointment for patient at this time. Advised patient that he could call back at anytime to schedule another appointment and patient was comfortable with this plan.

## 2019-03-27 ENCOUNTER — Ambulatory Visit: Payer: No Typology Code available for payment source | Attending: Family | Admitting: Family

## 2019-03-27 ENCOUNTER — Encounter: Payer: Self-pay | Admitting: Family

## 2019-03-27 ENCOUNTER — Other Ambulatory Visit: Payer: Self-pay

## 2019-03-27 VITALS — BP 156/94 | HR 81 | Resp 20 | Ht 67.0 in | Wt 219.0 lb

## 2019-03-27 DIAGNOSIS — I130 Hypertensive heart and chronic kidney disease with heart failure and stage 1 through stage 4 chronic kidney disease, or unspecified chronic kidney disease: Secondary | ICD-10-CM | POA: Insufficient documentation

## 2019-03-27 DIAGNOSIS — Z8249 Family history of ischemic heart disease and other diseases of the circulatory system: Secondary | ICD-10-CM | POA: Insufficient documentation

## 2019-03-27 DIAGNOSIS — E785 Hyperlipidemia, unspecified: Secondary | ICD-10-CM | POA: Insufficient documentation

## 2019-03-27 DIAGNOSIS — Z87891 Personal history of nicotine dependence: Secondary | ICD-10-CM | POA: Diagnosis not present

## 2019-03-27 DIAGNOSIS — I2510 Atherosclerotic heart disease of native coronary artery without angina pectoris: Secondary | ICD-10-CM | POA: Insufficient documentation

## 2019-03-27 DIAGNOSIS — F329 Major depressive disorder, single episode, unspecified: Secondary | ICD-10-CM | POA: Diagnosis not present

## 2019-03-27 DIAGNOSIS — Z7982 Long term (current) use of aspirin: Secondary | ICD-10-CM | POA: Insufficient documentation

## 2019-03-27 DIAGNOSIS — E1122 Type 2 diabetes mellitus with diabetic chronic kidney disease: Secondary | ICD-10-CM | POA: Insufficient documentation

## 2019-03-27 DIAGNOSIS — Z8049 Family history of malignant neoplasm of other genital organs: Secondary | ICD-10-CM | POA: Diagnosis not present

## 2019-03-27 DIAGNOSIS — Z79899 Other long term (current) drug therapy: Secondary | ICD-10-CM | POA: Insufficient documentation

## 2019-03-27 DIAGNOSIS — Z7902 Long term (current) use of antithrombotics/antiplatelets: Secondary | ICD-10-CM | POA: Diagnosis not present

## 2019-03-27 DIAGNOSIS — Z8349 Family history of other endocrine, nutritional and metabolic diseases: Secondary | ICD-10-CM | POA: Diagnosis not present

## 2019-03-27 DIAGNOSIS — I255 Ischemic cardiomyopathy: Secondary | ICD-10-CM | POA: Diagnosis not present

## 2019-03-27 DIAGNOSIS — Z794 Long term (current) use of insulin: Secondary | ICD-10-CM | POA: Insufficient documentation

## 2019-03-27 DIAGNOSIS — Z955 Presence of coronary angioplasty implant and graft: Secondary | ICD-10-CM | POA: Diagnosis not present

## 2019-03-27 DIAGNOSIS — Z95810 Presence of automatic (implantable) cardiac defibrillator: Secondary | ICD-10-CM | POA: Insufficient documentation

## 2019-03-27 DIAGNOSIS — I10 Essential (primary) hypertension: Secondary | ICD-10-CM

## 2019-03-27 DIAGNOSIS — G4733 Obstructive sleep apnea (adult) (pediatric): Secondary | ICD-10-CM | POA: Insufficient documentation

## 2019-03-27 DIAGNOSIS — I5042 Chronic combined systolic (congestive) and diastolic (congestive) heart failure: Secondary | ICD-10-CM | POA: Diagnosis not present

## 2019-03-27 DIAGNOSIS — Z951 Presence of aortocoronary bypass graft: Secondary | ICD-10-CM | POA: Diagnosis not present

## 2019-03-27 DIAGNOSIS — Z801 Family history of malignant neoplasm of trachea, bronchus and lung: Secondary | ICD-10-CM | POA: Diagnosis not present

## 2019-03-27 DIAGNOSIS — I252 Old myocardial infarction: Secondary | ICD-10-CM | POA: Diagnosis not present

## 2019-03-27 DIAGNOSIS — N183 Chronic kidney disease, stage 3 unspecified: Secondary | ICD-10-CM | POA: Diagnosis not present

## 2019-03-27 DIAGNOSIS — Z833 Family history of diabetes mellitus: Secondary | ICD-10-CM | POA: Insufficient documentation

## 2019-03-27 DIAGNOSIS — I509 Heart failure, unspecified: Secondary | ICD-10-CM | POA: Diagnosis present

## 2019-03-27 DIAGNOSIS — IMO0002 Reserved for concepts with insufficient information to code with codable children: Secondary | ICD-10-CM

## 2019-03-27 DIAGNOSIS — M1990 Unspecified osteoarthritis, unspecified site: Secondary | ICD-10-CM | POA: Insufficient documentation

## 2019-03-27 LAB — GLUCOSE, CAPILLARY: Glucose-Capillary: 208 mg/dL — ABNORMAL HIGH (ref 70–99)

## 2019-03-27 NOTE — Patient Instructions (Addendum)
Continue weighing daily and call for an overnight weight gain of > 2 pounds or a weekly weight gain of >5 pounds.   Call us in the future if you'd like to make another appointment 

## 2019-03-28 ENCOUNTER — Encounter: Payer: Medicare Other | Attending: Cardiology | Admitting: *Deleted

## 2019-03-28 ENCOUNTER — Other Ambulatory Visit: Payer: Self-pay

## 2019-03-28 DIAGNOSIS — Z9861 Coronary angioplasty status: Secondary | ICD-10-CM | POA: Diagnosis not present

## 2019-03-28 DIAGNOSIS — G4733 Obstructive sleep apnea (adult) (pediatric): Secondary | ICD-10-CM | POA: Diagnosis not present

## 2019-03-28 DIAGNOSIS — Z79899 Other long term (current) drug therapy: Secondary | ICD-10-CM | POA: Diagnosis not present

## 2019-03-28 DIAGNOSIS — Z7901 Long term (current) use of anticoagulants: Secondary | ICD-10-CM | POA: Insufficient documentation

## 2019-03-28 DIAGNOSIS — I214 Non-ST elevation (NSTEMI) myocardial infarction: Secondary | ICD-10-CM | POA: Insufficient documentation

## 2019-03-28 DIAGNOSIS — F329 Major depressive disorder, single episode, unspecified: Secondary | ICD-10-CM | POA: Diagnosis not present

## 2019-03-28 DIAGNOSIS — E118 Type 2 diabetes mellitus with unspecified complications: Secondary | ICD-10-CM | POA: Insufficient documentation

## 2019-03-28 DIAGNOSIS — F419 Anxiety disorder, unspecified: Secondary | ICD-10-CM | POA: Insufficient documentation

## 2019-03-28 DIAGNOSIS — I110 Hypertensive heart disease with heart failure: Secondary | ICD-10-CM | POA: Insufficient documentation

## 2019-03-28 DIAGNOSIS — I5042 Chronic combined systolic (congestive) and diastolic (congestive) heart failure: Secondary | ICD-10-CM | POA: Insufficient documentation

## 2019-03-28 DIAGNOSIS — Z7982 Long term (current) use of aspirin: Secondary | ICD-10-CM | POA: Insufficient documentation

## 2019-03-28 DIAGNOSIS — Z794 Long term (current) use of insulin: Secondary | ICD-10-CM | POA: Insufficient documentation

## 2019-03-28 DIAGNOSIS — Z87891 Personal history of nicotine dependence: Secondary | ICD-10-CM | POA: Diagnosis not present

## 2019-03-28 DIAGNOSIS — E7800 Pure hypercholesterolemia, unspecified: Secondary | ICD-10-CM | POA: Diagnosis not present

## 2019-03-28 DIAGNOSIS — F439 Reaction to severe stress, unspecified: Secondary | ICD-10-CM | POA: Insufficient documentation

## 2019-03-28 NOTE — Progress Notes (Signed)
Daily Session Note  Patient Details  Name: Martin Bush MRN: [id number] Date of Birth: [birth year] Referring Provider:     Cardiac Rehab from 03/06/2019 in [facility] Cardiac and Pulmonary Rehab  Referring Provider  Adrian Prows MD      Encounter Date: 03/28/2019  Check In: Session Check In - 03/28/19 1551      Check-In   Supervising physician immediately available to respond to emergencies  See telemetry face sheet for immediately available ER MD    Location  [facility]-Cardiac & Pulmonary Rehab    Staff Present  Nada Maclachlan, BA, ACSM CEP, Exercise Physiologist;[name] Caiola RDN, LDN;Shmiel Morton Sherryll Burger, RN BSN    Virtual Visit  No    Medication changes reported      No    Fall or balance concerns reported     No    Warm-up and Cool-down  Performed on first and last piece of equipment    Resistance Training Performed  Yes    VAD Patient?  No    PAD/SET Patient?  No      Pain Assessment   Currently in Pain?  No/denies          Social History   Tobacco Use  Smoking Status Former Smoker  . Packs/day: 0.25  . Years: 27.00  . Pack years: 6.75  . Types: Cigars  . Quit date: 2002  . Years since quitting: 19.1  Smokeless Tobacco Former Systems developer  . Quit date: 2002    Goals Met:  Independence with exercise equipment Exercise tolerated well No report of cardiac concerns or symptoms Strength training completed today  Goals Unmet:  Not Applicable  Comments: Pt able to follow exercise prescription today without complaint.  Will continue to monitor for progression.    Dr. Emily Filbert is Medical Director for [place] and [facility].

## 2019-03-29 ENCOUNTER — Encounter: Payer: Medicare Other | Admitting: *Deleted

## 2019-03-29 ENCOUNTER — Other Ambulatory Visit: Payer: Self-pay

## 2019-03-29 DIAGNOSIS — I5042 Chronic combined systolic (congestive) and diastolic (congestive) heart failure: Secondary | ICD-10-CM | POA: Diagnosis not present

## 2019-03-29 DIAGNOSIS — I110 Hypertensive heart disease with heart failure: Secondary | ICD-10-CM | POA: Diagnosis not present

## 2019-03-29 DIAGNOSIS — E7800 Pure hypercholesterolemia, unspecified: Secondary | ICD-10-CM | POA: Diagnosis not present

## 2019-03-29 DIAGNOSIS — Z7982 Long term (current) use of aspirin: Secondary | ICD-10-CM | POA: Diagnosis not present

## 2019-03-29 DIAGNOSIS — I214 Non-ST elevation (NSTEMI) myocardial infarction: Secondary | ICD-10-CM

## 2019-03-29 DIAGNOSIS — Z9861 Coronary angioplasty status: Secondary | ICD-10-CM

## 2019-03-29 DIAGNOSIS — E118 Type 2 diabetes mellitus with unspecified complications: Secondary | ICD-10-CM | POA: Diagnosis not present

## 2019-03-29 DIAGNOSIS — G4733 Obstructive sleep apnea (adult) (pediatric): Secondary | ICD-10-CM | POA: Diagnosis not present

## 2019-03-29 DIAGNOSIS — Z794 Long term (current) use of insulin: Secondary | ICD-10-CM | POA: Diagnosis not present

## 2019-03-29 DIAGNOSIS — Z7901 Long term (current) use of anticoagulants: Secondary | ICD-10-CM | POA: Diagnosis not present

## 2019-03-29 DIAGNOSIS — Z79899 Other long term (current) drug therapy: Secondary | ICD-10-CM | POA: Diagnosis not present

## 2019-03-29 DIAGNOSIS — Z87891 Personal history of nicotine dependence: Secondary | ICD-10-CM | POA: Diagnosis not present

## 2019-03-29 NOTE — Progress Notes (Signed)
Reviewed home exercise with pt today.  Pt plans to walk and use bike at home for exercise.  Also talked about using staff videos. Reviewed THR, pulse, RPE, sign and symptoms, NTG use, and when to call 911 or MD.  Also discussed weather considerations and indoor options.  Pt voiced understanding.

## 2019-03-29 NOTE — Progress Notes (Signed)
Daily Session Note  Patient Details  Name: Martin Bush MRN: [id number] Date of Birth: [birth year] Referring Provider:     Cardiac Rehab from 03/06/2019 in [facility] Cardiac and Pulmonary Rehab  Referring Provider  Adrian Prows MD      Encounter Date: 03/29/2019  Check In: Session Check In - 03/29/19 1513      Check-In   Supervising physician immediately available to respond to emergencies  See telemetry face sheet for immediately available ER MD    Location  [facility]-Cardiac & Pulmonary Rehab    Staff Present  Alberteen Sam, MA, RCEP, CCRP, CCET;Posie Lillibridge Sherryll Burger, RN BSN;Joseph Hood RCP,RRT,BSRT    Virtual Visit  No    Medication changes reported      No    Fall or balance concerns reported     No    Warm-up and Cool-down  Performed on first and last piece of equipment    Resistance Training Performed  Yes    VAD Patient?  No    PAD/SET Patient?  No      Pain Assessment   Currently in Pain?  No/denies          Social History   Tobacco Use  Smoking Status Former Smoker  . Packs/day: 0.25  . Years: 27.00  . Pack years: 6.75  . Types: Cigars  . Quit date: 2002  . Years since quitting: 19.1  Smokeless Tobacco Former Systems developer  . Quit date: 2002    Goals Met:  Independence with exercise equipment Exercise tolerated well No report of cardiac concerns or symptoms Strength training completed today  Goals Unmet:  Not Applicable  Comments: Pt able to follow exercise prescription today without complaint.  Will continue to monitor for progression.    Dr. Emily Filbert is Medical Director for [place] and [facility].

## 2019-04-02 ENCOUNTER — Encounter: Payer: Medicare Other | Admitting: *Deleted

## 2019-04-02 ENCOUNTER — Other Ambulatory Visit: Payer: Self-pay

## 2019-04-02 DIAGNOSIS — Z7982 Long term (current) use of aspirin: Secondary | ICD-10-CM | POA: Diagnosis not present

## 2019-04-02 DIAGNOSIS — Z794 Long term (current) use of insulin: Secondary | ICD-10-CM | POA: Diagnosis not present

## 2019-04-02 DIAGNOSIS — I110 Hypertensive heart disease with heart failure: Secondary | ICD-10-CM | POA: Diagnosis not present

## 2019-04-02 DIAGNOSIS — I214 Non-ST elevation (NSTEMI) myocardial infarction: Secondary | ICD-10-CM

## 2019-04-02 DIAGNOSIS — G4733 Obstructive sleep apnea (adult) (pediatric): Secondary | ICD-10-CM | POA: Diagnosis not present

## 2019-04-02 DIAGNOSIS — E7800 Pure hypercholesterolemia, unspecified: Secondary | ICD-10-CM | POA: Diagnosis not present

## 2019-04-02 DIAGNOSIS — E118 Type 2 diabetes mellitus with unspecified complications: Secondary | ICD-10-CM | POA: Diagnosis not present

## 2019-04-02 DIAGNOSIS — Z9861 Coronary angioplasty status: Secondary | ICD-10-CM | POA: Diagnosis not present

## 2019-04-02 DIAGNOSIS — Z79899 Other long term (current) drug therapy: Secondary | ICD-10-CM | POA: Diagnosis not present

## 2019-04-02 DIAGNOSIS — Z87891 Personal history of nicotine dependence: Secondary | ICD-10-CM | POA: Diagnosis not present

## 2019-04-02 DIAGNOSIS — I5042 Chronic combined systolic (congestive) and diastolic (congestive) heart failure: Secondary | ICD-10-CM | POA: Diagnosis not present

## 2019-04-02 DIAGNOSIS — Z7901 Long term (current) use of anticoagulants: Secondary | ICD-10-CM | POA: Diagnosis not present

## 2019-04-02 NOTE — Progress Notes (Signed)
Daily Session Note  Patient Details  Name: Martin Bush MRN: [id number] Date of Birth: 1955-04-27 Referring Provider:     Cardiac Rehab from 03/06/2019 in [facility] Cardiac and Pulmonary Rehab  Referring Provider  Adrian Prows MD      Encounter Date: 04/02/2019  Check In: Session Check In - 04/02/19 1537      Check-In   Supervising physician immediately available to respond to emergencies  See telemetry face sheet for immediately available ER MD    Location  [facility]Cardiac [facility] Pulmonary Rehab    Staff Present  Earlean Shawl, BS, ACSM CEP, Exercise Physiologist;Roc Streett Sherryll Burger, RN BSN;Joseph Hood RCP,RRT,BSRT    Virtual Visit  No    Medication changes reported      No    Fall or balance concerns reported     No    Warm-up and Cool-down  Performed on first and last piece of equipment    Resistance Training Performed  Yes    VAD Patient?  No    PAD/SET Patient?  No      Pain Assessment   Currently in Pain?  No/denies          Social History   Tobacco Use  Smoking Status Former Smoker  . Packs/day: 0.25  . Years: 27.00  . Pack years: 6.75  . Types: Cigars  . Quit date: 2002  . Years since quitting: 19.1  Smokeless Tobacco Former Systems developer  . Quit date: 2002    Goals Met:  Independence with exercise equipment Exercise tolerated well No report of cardiac concerns or symptoms Strength training completed today  Goals Unmet:  Not Applicable  Comments: Pt able to follow exercise prescription today without complaint.  Will continue to monitor for progression.    Dr. Emily Filbert is Medical Director for [place] and [facility].

## 2019-04-07 DIAGNOSIS — I5043 Acute on chronic combined systolic (congestive) and diastolic (congestive) heart failure: Secondary | ICD-10-CM | POA: Diagnosis not present

## 2019-04-09 ENCOUNTER — Other Ambulatory Visit: Payer: Self-pay

## 2019-04-09 ENCOUNTER — Encounter: Payer: Medicare Other | Admitting: *Deleted

## 2019-04-09 DIAGNOSIS — Z79899 Other long term (current) drug therapy: Secondary | ICD-10-CM | POA: Diagnosis not present

## 2019-04-09 DIAGNOSIS — E1165 Type 2 diabetes mellitus with hyperglycemia: Secondary | ICD-10-CM | POA: Diagnosis not present

## 2019-04-09 DIAGNOSIS — Z7982 Long term (current) use of aspirin: Secondary | ICD-10-CM | POA: Diagnosis not present

## 2019-04-09 DIAGNOSIS — Z9861 Coronary angioplasty status: Secondary | ICD-10-CM

## 2019-04-09 DIAGNOSIS — Z7901 Long term (current) use of anticoagulants: Secondary | ICD-10-CM | POA: Diagnosis not present

## 2019-04-09 DIAGNOSIS — E782 Mixed hyperlipidemia: Secondary | ICD-10-CM | POA: Diagnosis not present

## 2019-04-09 DIAGNOSIS — E1121 Type 2 diabetes mellitus with diabetic nephropathy: Secondary | ICD-10-CM | POA: Diagnosis not present

## 2019-04-09 DIAGNOSIS — Z87891 Personal history of nicotine dependence: Secondary | ICD-10-CM | POA: Diagnosis not present

## 2019-04-09 DIAGNOSIS — I214 Non-ST elevation (NSTEMI) myocardial infarction: Secondary | ICD-10-CM | POA: Diagnosis not present

## 2019-04-09 DIAGNOSIS — E7800 Pure hypercholesterolemia, unspecified: Secondary | ICD-10-CM | POA: Diagnosis not present

## 2019-04-09 DIAGNOSIS — I5042 Chronic combined systolic (congestive) and diastolic (congestive) heart failure: Secondary | ICD-10-CM | POA: Diagnosis not present

## 2019-04-09 DIAGNOSIS — Z794 Long term (current) use of insulin: Secondary | ICD-10-CM | POA: Diagnosis not present

## 2019-04-09 DIAGNOSIS — E118 Type 2 diabetes mellitus with unspecified complications: Secondary | ICD-10-CM | POA: Diagnosis not present

## 2019-04-09 DIAGNOSIS — I10 Essential (primary) hypertension: Secondary | ICD-10-CM | POA: Diagnosis not present

## 2019-04-09 DIAGNOSIS — G4733 Obstructive sleep apnea (adult) (pediatric): Secondary | ICD-10-CM | POA: Diagnosis not present

## 2019-04-09 DIAGNOSIS — I110 Hypertensive heart disease with heart failure: Secondary | ICD-10-CM | POA: Diagnosis not present

## 2019-04-09 NOTE — Progress Notes (Signed)
Daily Session Note  Patient Details  Name: Aldean Pipe MRN: [id number] Date of Birth: 1955/04/02 Referring Provider:     Cardiac Rehab from 03/06/2019 in [facility] Cardiac and Pulmonary Rehab  Referring Provider  Adrian Prows MD      Encounter Date: 04/09/2019  Check In: Session Check In - 04/09/19 1526      Check-In   Supervising physician immediately available to respond to emergencies  See telemetry face sheet for immediately available ER MD    Location  [facility]Cardiac & Pulmonary Rehab    Staff Present  Renita Papa, RN BSN;Joseph [address] [place], [place], ACSM CEP, Exercise Physiologist    Virtual Visit  No    Medication changes reported      No    Fall or balance concerns reported     No    Warm-up and Cool-down  Performed on first and last piece of equipment    Resistance Training Performed  Yes    VAD Patient?  No    PAD/SET Patient?  No      Pain Assessment   Currently in Pain?  No/denies          Social History   Tobacco Use  Smoking Status Former Smoker  . Packs/day: 0.25  . Years: 27.00  . Pack years: 6.75  . Types: Cigars  . Quit date: 2002  . Years since quitting: 19.2  Smokeless Tobacco Former Systems developer  . Quit date: 2002    Goals Met:  Independence with exercise equipment Exercise tolerated well No report of cardiac concerns or symptoms Strength training completed today  Goals Unmet:  Not Applicable  Comments: Pt able to follow exercise prescription today without complaint.  Will continue to monitor for progression.    Dr. Emily Filbert is Medical Director for [place] and [facility].

## 2019-04-11 ENCOUNTER — Encounter: Payer: Medicare Other | Admitting: *Deleted

## 2019-04-11 ENCOUNTER — Other Ambulatory Visit: Payer: Self-pay

## 2019-04-11 DIAGNOSIS — Z9861 Coronary angioplasty status: Secondary | ICD-10-CM | POA: Diagnosis not present

## 2019-04-11 DIAGNOSIS — Z794 Long term (current) use of insulin: Secondary | ICD-10-CM | POA: Diagnosis not present

## 2019-04-11 DIAGNOSIS — E118 Type 2 diabetes mellitus with unspecified complications: Secondary | ICD-10-CM | POA: Diagnosis not present

## 2019-04-11 DIAGNOSIS — I110 Hypertensive heart disease with heart failure: Secondary | ICD-10-CM | POA: Diagnosis not present

## 2019-04-11 DIAGNOSIS — Z7901 Long term (current) use of anticoagulants: Secondary | ICD-10-CM | POA: Diagnosis not present

## 2019-04-11 DIAGNOSIS — E7800 Pure hypercholesterolemia, unspecified: Secondary | ICD-10-CM | POA: Diagnosis not present

## 2019-04-11 DIAGNOSIS — G4733 Obstructive sleep apnea (adult) (pediatric): Secondary | ICD-10-CM | POA: Diagnosis not present

## 2019-04-11 DIAGNOSIS — I5042 Chronic combined systolic (congestive) and diastolic (congestive) heart failure: Secondary | ICD-10-CM | POA: Diagnosis not present

## 2019-04-11 DIAGNOSIS — I214 Non-ST elevation (NSTEMI) myocardial infarction: Secondary | ICD-10-CM

## 2019-04-11 DIAGNOSIS — Z79899 Other long term (current) drug therapy: Secondary | ICD-10-CM | POA: Diagnosis not present

## 2019-04-11 DIAGNOSIS — Z87891 Personal history of nicotine dependence: Secondary | ICD-10-CM | POA: Diagnosis not present

## 2019-04-11 DIAGNOSIS — Z7982 Long term (current) use of aspirin: Secondary | ICD-10-CM | POA: Diagnosis not present

## 2019-04-11 NOTE — Progress Notes (Signed)
Daily Session Note  Patient Details  Name: Martin Bush MRN: [id number] Date of Birth: 07/12/1955 Referring Provider:     Cardiac Rehab from 03/06/2019 in [facility] Cardiac and Pulmonary Rehab  Referring Provider  Ganji, Jay MD      Encounter Date: 04/11/2019  Check In: Session Check In - 04/11/19 1509      Check-In   Supervising physician immediately available to respond to emergencies  See telemetry face sheet for immediately available ER MD    Location  [facility]-Cardiac & Pulmonary Rehab    Staff Present  Meredith Craven, RN BSN;Joseph Hood RCP,RRT,BSRT;[name] Caiola RDN, LDN    Virtual Visit  No    Medication changes reported      No    Fall or balance concerns reported     No    Warm-up and Cool-down  Performed on first and last piece of equipment    Resistance Training Performed  Yes    VAD Patient?  No    PAD/SET Patient?  No      Pain Assessment   Currently in Pain?  No/denies          Social History   Tobacco Use  Smoking Status Former Smoker  . Packs/day: 0.25  . Years: 27.00  . Pack years: 6.75  . Types: Cigars  . Quit date: 2002  . Years since quitting: 19.2  Smokeless Tobacco Former User  . Quit date: 2002    Goals Met:  Independence with exercise equipment Exercise tolerated well No report of cardiac concerns or symptoms Strength training completed today  Goals Unmet:  Not Applicable  Comments: Pt able to follow exercise prescription today without complaint.  Will continue to monitor for progression.    Dr. Mark Miller is Medical Director for [facility] and [facility]. 

## 2019-04-12 ENCOUNTER — Encounter: Payer: Medicare Other | Admitting: *Deleted

## 2019-04-12 ENCOUNTER — Other Ambulatory Visit: Payer: Self-pay

## 2019-04-12 DIAGNOSIS — I214 Non-ST elevation (NSTEMI) myocardial infarction: Secondary | ICD-10-CM | POA: Diagnosis not present

## 2019-04-12 DIAGNOSIS — Z87891 Personal history of nicotine dependence: Secondary | ICD-10-CM | POA: Diagnosis not present

## 2019-04-12 DIAGNOSIS — E7800 Pure hypercholesterolemia, unspecified: Secondary | ICD-10-CM | POA: Diagnosis not present

## 2019-04-12 DIAGNOSIS — I110 Hypertensive heart disease with heart failure: Secondary | ICD-10-CM | POA: Diagnosis not present

## 2019-04-12 DIAGNOSIS — Z7901 Long term (current) use of anticoagulants: Secondary | ICD-10-CM | POA: Diagnosis not present

## 2019-04-12 DIAGNOSIS — E118 Type 2 diabetes mellitus with unspecified complications: Secondary | ICD-10-CM | POA: Diagnosis not present

## 2019-04-12 DIAGNOSIS — Z9861 Coronary angioplasty status: Secondary | ICD-10-CM | POA: Diagnosis not present

## 2019-04-12 DIAGNOSIS — Z7982 Long term (current) use of aspirin: Secondary | ICD-10-CM | POA: Diagnosis not present

## 2019-04-12 DIAGNOSIS — Z794 Long term (current) use of insulin: Secondary | ICD-10-CM | POA: Diagnosis not present

## 2019-04-12 DIAGNOSIS — I5042 Chronic combined systolic (congestive) and diastolic (congestive) heart failure: Secondary | ICD-10-CM | POA: Diagnosis not present

## 2019-04-12 DIAGNOSIS — G4733 Obstructive sleep apnea (adult) (pediatric): Secondary | ICD-10-CM | POA: Diagnosis not present

## 2019-04-12 DIAGNOSIS — Z79899 Other long term (current) drug therapy: Secondary | ICD-10-CM | POA: Diagnosis not present

## 2019-04-12 NOTE — Progress Notes (Signed)
Daily Session Note  Patient Details  Name: Martin Bush MRN: [id number] Date of Birth: 1956-01-12 Referring Provider:     Cardiac Rehab from 03/06/2019 in [facility] Cardiac and Pulmonary Rehab  Referring Provider  Adrian Prows MD      Encounter Date: 04/12/2019  Check In: Session Check In - 04/12/19 1530      Check-In   Supervising physician immediately available to respond to emergencies  See telemetry face sheet for immediately available ER MD    Location  [facility]Cardiac & Pulmonary Rehab    Staff Present  Renita Papa, RN BSN;Joseph [address] [place], [name], RCEP, CCRP, CCET    Virtual Visit  No    Medication changes reported      No    Fall or balance concerns reported     No    Warm-up and Cool-down  Performed on first and last piece of equipment    Resistance Training Performed  Yes    VAD Patient?  No    PAD/SET Patient?  No      Pain Assessment   Currently in Pain?  No/denies          Social History   Tobacco Use  Smoking Status Former Smoker  . Packs/day: 0.25  . Years: 27.00  . Pack years: 6.75  . Types: Cigars  . Quit date: 2002  . Years since quitting: 19.2  Smokeless Tobacco Former Systems developer  . Quit date: 2002    Goals Met:  Independence with exercise equipment Exercise tolerated well No report of cardiac concerns or symptoms Strength training completed today  Goals Unmet:  Not Applicable  Comments: Pt able to follow exercise prescription today without complaint.  Will continue to monitor for progression.    Dr. Emily Filbert is Medical Director for [place] and [facility].

## 2019-04-13 ENCOUNTER — Encounter: Payer: Self-pay | Admitting: *Deleted

## 2019-04-13 ENCOUNTER — Other Ambulatory Visit: Payer: Self-pay | Admitting: *Deleted

## 2019-04-13 NOTE — Patient Outreach (Signed)
[facility]) Care Management [place] Telephone Outreach Post-hospital discharge day # 92 without unplanned hospital re-admission  04/13/2019  Martin Bush 30-May-1955 [id number]  Successful telephone outreach to Martin Bush, 64 y/o male referred 01/12/2019 to [facility] by [facility] Liaison after recent hospitalization December 9- 17, 2020 for acute pulmonary edema/ NSTEMI with native vessel disease and stent re-stenosis; patient had cardiac catherization 01/08/2019 and eventual PCI/ angioplasty.Patient was discharged home to self care withouthome health services in place. 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  HIPAA/ identity verified with patient; patient reports "doing just fine," and states "nothing new;" he denies pain and new/ recent falls. Patient sounds to be in no distress throughout call today.  Patient further reports:  -- no medicationconcerns; no recently prescribed changes to medications; continues self-managing medications; patientable to verbalize accurate understanding of current insulin doses; he reports today that he has begun taking his Lasix PRN-- stating concerns around signs/ symptoms dehydration; reports taking "about twice a week," and reports last took "about 3 days ago."  Encouraged patient to share this information with cardiology provider at upcoming provider office visit Monday 04/16/19-- also informed patient that I would reach out to cardiology provider as an FYI; medication list in EHR updated according to patient report ---- discussed with patient that medications may need to be adjusted, as we reviewed his daily weights at home and he continues to report gradual increase weight ranges over time without other signs/ symptoms yellow CHF zone  -- reviewed with patient recent/ upcoming provider appointments;  verbalizes accurate understanding of upcoming appointments and plans to attend all- continues driving self; wife assists if indicated ---- cardiology follow up appointment 04/16/19: verbalizes plans to attend ---- cardiac rehabilitation in progress: reports adherence to attending all sessions and states "going good;" states that he has muscle soreness form all the "working out;" patient was encouraged to continue exercises learned once his time with cardiac rehabilitation is completed ---- attended CHF clinic office visit 03/27/19: reports he was released from ongoing follow up with this clinic ---- attended [facility] provider endocrinology/ PCP office visit "recently" but cannot remember exact date: reports A1-C was completed but has not heard results yet: encouraged patient to reach out to [place] soon to obtain results and again reviewed A1-C trends as available in EHR  Self-health management ofCHF/ CAD/ DM: --continuesusinghome O2 at 2 L/min "at night only;" also uses with CPAP; has not needed to use home O2 except at night, post-hospital discharge per patient report; adherent to CPAP- denies problems with either -- hascontinueddaily weight monitoring/ recording at home; reviewed with patient daily recorded weights and today he reports weights consistently between 219-220:  noted this was a significant increase from previously reported ranges 02/20/19, 205-206pounds and then again 03/20/19 of 214-216 lbs-- ; patientagain states that he has not experienced weight gain > 3 lbs overnight/ 5 lbs in one week, and states that the weight "has just gradually increased."  Denies shortness of breath/ activity intolerance/ swelling outside of baseline ---- confirmed that patient continues able to independently verbalize signs/ symptoms CHF yellow zone and weight gain guidelines in setting of CHF along with corresponding action plan -- has continues actively participating in cardiac rehabilitation 3 times per week;  states "going just fine" and reports that he "has sore muscles" from the workout.  Encouraged his ongoing participation  as well as considering to continue activity once sessions are completed  -- continues monitoring/ recording blood sugars at homeand denies issues and concerns around blood sugars, again reports "they areall between the high 100's to low 200's fasting" and post-prandial are "between 200-225." Denies signs/ symptoms hypo- hyper- glycemia and again does not offer specific values for blood sugar ranges stating he is currently not at home to review specifics ---- re-reviewed with patient his A1-C trends over last year and using teach back method, provided education around how blood sugar values at home correlate to A1-C values: encouraged him to pro-actively reach out to [place] provider to obtain latest A1-C result, as above 2020 A1-C values noted: 14.7 in October 2020 and 11.6 in December 2020 -- confirmed that patient received previously provided printed educational material around self-health management of DM: using teachback method, reviewed with patient today.   Patient denies further issues, concerns, or problems today. Iconfirmed that patient hasmy direct phone number, the main [facility] CM office phone number, and the [facility] CM 24-hour nurse advice phone number should issues arise prior to next scheduled [place] outreach next month. Encouraged patient to contact me directly if needs, questions, issues, or concerns arise prior to next scheduled outreach; patient agreed to do so.  Plan:  Patient will take medications as prescribed and will attend all scheduled provider appointments  Patient will promptly notify care providers for any new concerns/ issues/ problems that arise  Patient willcontinuemonitoring/ recording daily weights at home and will follow weight gain guidelines and corresponding action plan for weight gain  I will send PCP and cardiology provider today's  note as an FYI around patient's reported use of diuretic and weight gain, as [facility] CM quarterly update   Patient will continue following heart healthy and diabetic diet  Patient will continue monitoring and recording blood sugars at home and will reach out to [place] provider to obtain most recent A1-C value  [place] outreach to continue with scheduled phone callnext month  [facility] CM Care Plan Problem Two     Most Recent Value  Care Plan Problem Two  Ongoing reinforcement of self-health management of chronic disease states of CHF and Diabetes, as evidenced by patient reporting   Role Documenting the Problem Two  Care Management Coordinator  Care Plan for Problem Two  Active  Interventions for Problem Two Long Term Goal   Discussed current clinical condition with patient and confirmed that he has no current clinical concerns,  reviewed with patient recently recorded weights and blood sugars at home,  encouraged patient to take all recorded values to upcoming provider appointments for review,  discussed use of diuretic for CHF and sent cardiology provider today's notes as FYI regarding patient's newly reported PRN use of diuretic due to concerns around symptoms of dehydration.  Confirmed that patient received and reviewed previously provided educational material around selfhealth management of chronic disease states of CHF and DM-- using teach back method, reviewed these items with patient today.  Discussed with patient best times to monitor blood sugars at home for evaluation of established treatment plan: fasting and 2-hours post-prandial- encouraged patient to monitor during these times and share home values with care providers  [facility] Long Term Goal  Over the next 60 days, patient will continue monitoring and recording daily weights and blood sugars at home, as evidenced by patient reporting and review of same during [facility] RN CM outreach [Goal re-established today]  [facility] Long Term Goal Start Date  04/13/19  [facility] CM Short Term Goal #1   Over the next 30 days, patient will continue actively participating in cardiac rehabilitation as evidenced by patient reporting and review of EMR as indicated during [facility] RN CM outreach  [facility] CM Short Term Goal #1 Start Date  03/20/19  [facility] CM Short Term Goal #1 Met Date   04/13/19 [Goal met]  Interventions for Short Term Goal #2   Confirmed that patient has continued active participation in cardiac rehabilitation and encouraged his ongoing participation,  confirmed that patient is tolerating program well- encouraged him to consider continuing exercises learned, beyond currently scheduled cardiac rehabilitation program sessions  [facility] CM Short Term Goal #2   Over the next 30 days, patient will verbalize most recent A1-C level as evidenced by patient reporting and review of EMR during [facility] RN CM outreach [Goal re-established today]  [facility] CM Short Term Goal #2 Start Date  04/13/19 [Goal re-established today]  Interventions for Short Term Goal #2  Confirmed that patient attended endocrinology appointment at [facility],  confirmed that A1-C was completed, but that patient has not yet received results,  again reviewed trends of most recently documented A1-C's and encouraged patient to obtain results promptly for further education/ empowerment     Oneta Rack, RN, BSN, [place] Coordinator [facility] Care Management  [phone]

## 2019-04-16 ENCOUNTER — Encounter: Payer: Self-pay | Admitting: Cardiology

## 2019-04-16 ENCOUNTER — Ambulatory Visit: Payer: No Typology Code available for payment source | Admitting: Cardiology

## 2019-04-16 ENCOUNTER — Other Ambulatory Visit: Payer: Self-pay

## 2019-04-16 VITALS — BP 112/73 | HR 84 | Temp 97.6°F | Resp 16 | Ht 67.0 in | Wt 221.5 lb

## 2019-04-16 DIAGNOSIS — I2510 Atherosclerotic heart disease of native coronary artery without angina pectoris: Secondary | ICD-10-CM

## 2019-04-16 DIAGNOSIS — Z95810 Presence of automatic (implantable) cardiac defibrillator: Secondary | ICD-10-CM | POA: Diagnosis not present

## 2019-04-16 DIAGNOSIS — IMO0002 Reserved for concepts with insufficient information to code with codable children: Secondary | ICD-10-CM

## 2019-04-16 DIAGNOSIS — I25810 Atherosclerosis of coronary artery bypass graft(s) without angina pectoris: Secondary | ICD-10-CM | POA: Diagnosis not present

## 2019-04-16 DIAGNOSIS — N183 Chronic kidney disease, stage 3 unspecified: Secondary | ICD-10-CM

## 2019-04-16 DIAGNOSIS — I10 Essential (primary) hypertension: Secondary | ICD-10-CM | POA: Diagnosis not present

## 2019-04-16 DIAGNOSIS — Z79899 Other long term (current) drug therapy: Secondary | ICD-10-CM

## 2019-04-16 DIAGNOSIS — I5042 Chronic combined systolic (congestive) and diastolic (congestive) heart failure: Secondary | ICD-10-CM

## 2019-04-16 DIAGNOSIS — E1122 Type 2 diabetes mellitus with diabetic chronic kidney disease: Secondary | ICD-10-CM

## 2019-04-16 NOTE — Progress Notes (Signed)
Primary Physician/Referring:  [name], MD  Patient ID: Martin Bush, male    DOB: 1955/07/27, 64 y.o.   MRN: [id number]  Chief Complaint  Patient presents with  . Coronary Artery Disease  . Follow-up    3 month   HPI:    Martin Bush  is a 64 y.o.  Caucasian male  with CAD and history of CABG x2 in 2002 with only LIMA to LAD being patent, Circumflex coronary artery is super dominant. H/O  Left main stenting in 2015 and in 2016 he had complex PCI to in-stent restenotic circumflex/OM bifurcation, due to recurrent restenosis, underwent stenting to left main and circumflex proximal and midsegment with implantation of 2 overlapping 3.5 x 20 and 3.0 x 20 mm  Synergy DES on 08/16/2017 when he presented with decompensated heart failure and recurrent VT.    Similar presentation on 01/03/2019, coronary angiography on 01/05/2019 revealing severely restenotic left main stent that extends into the circumflex proximal and also old mid circumflex stent and successful Wolverine 3.5 mm cutting balloon angioplasty on 01/11/19. He also had slow VT but stable hemodynamics at presentation. He is on chronic amiodarone.   He is feeling well and has not had any further dyspnea, palpitations or chest pain. Presents for a 3 month OV and is tolerating all his medications and anti platelet therapy without complications.    Past Medical History:  Diagnosis Date  . AICD (automatic cardioverter/defibrillator) present    Medtronic  . Arthritis    "thumbs"  (08/02/2017)  . CHF (congestive heart failure) ([place])   . Chronic combined systolic and diastolic heart failure ([place])    a. 08/2017 Echo: EF 20-25%, mod glob HK. Sev distal ant sept, inflat HK. Apical AK. Gr2 DD. Mildly reduced RV fxn.  . Colon polyps   . Coronary artery disease    a. s/p CABG x 2 (LIMA->LAD, VG->OM); b. Multiple PCI's to LM/LCX/OM; c. 07/2017 PTCA of LM/LCX w/ early ISR-->repeat PCI/DES to LM (3.5x20 Synergy DES) and LCX (3.0x20 Synergy  DES); d. 07/2018 Relook Cath: LM patent stent, LAD 100ost, LCX patent stent, OM1 99/60, LIMA->LAD ok. VG->dLCX 49 (old).  . Depression   . Encounter for assessment of implantable cardioverter-defibrillator (ICD) 09/27/2018  . High cholesterol   . Hypertension   . ICD; Biventricular  Medtronic ICD Amplia MRI QWuad CRT-D  in situ 10/29/14 10/29/2014   Remote ICD check 09.23.20:  One 6 beat NSVT. No therapy.  1 SVT episode @ 130 bpm (38 Sec).  There were 23 Vent sense episodes detected for up to 1.1 min/day (AT). Health trends (patient activity, heart rate variability, average heart rates) are stable.Trans-thoracic impedance trends and the OptiVol Fluid Index do no present significant abnormalities. Battery longevity is 4.3 years. RA pacing is 3  . Ischemic cardiomyopathy 09/27/2018  . MI (myocardial infarction) ([place]) 2003  . NSTEMI (non-ST elevated myocardial infarction) ([place]) 07/16/2014  . OSA on CPAP   . Oxygen deficiency   . Pneumonia 10/2014  . Proteinuria   . Sleep apnea   . Type II diabetes mellitus (HCC)    insulin dependent   Past Surgical History:  Procedure Laterality Date  . BIOPSY  09/19/2018   Procedure: BIOPSY;  Surgeon: Thornton Park, MD;  Location: [facility];  Service: Gastroenterology;;  . CARDIAC CATHETERIZATION N/A 07/16/2014   Procedure: Left Heart Cath and Coronary Angiography;  Surgeon: Adrian Prows, MD;  Location: [place] [facility];  Service: Cardiovascular;  Laterality: N/A;  . CARDIAC CATHETERIZATION  07/16/2014   Procedure: Coronary Balloon Angioplasty;  Surgeon: Adrian Prows, MD;  Location: [place] [facility];  Service: Cardiovascular;;  . CARDIAC CATHETERIZATION  2003  . CARDIAC DEFIBRILLATOR PLACEMENT  2016  . CATARACT EXTRACTION W/ INTRAOCULAR LENS IMPLANT Left 03/2014  . COLONOSCOPY    . COLONOSCOPY WITH PROPOFOL N/A 09/19/2018   Procedure: COLONOSCOPY WITH PROPOFOL;  Surgeon: Thornton Park, MD;  Location: [facility];  Service: Gastroenterology;  Laterality:  N/A;  . CORONARY ANGIOPLASTY WITH STENT PLACEMENT     "I've got a total of 8 stents in there; mostly doine at [facility]" (08/02/2017)  . CORONARY ARTERY BYPASS GRAFT  ~ 2003   "CABG X2"; [facility]  . CORONARY ATHERECTOMY N/A 08/16/2017   Procedure: CORONARY ATHERECTOMY;  Surgeon: Nigel Mormon, MD;  Location: [place] [facility];  Service: Cardiovascular;  Laterality: N/A;  . CORONARY BALLOON ANGIOPLASTY N/A 08/04/2017   Procedure: CORONARY BALLOON ANGIOPLASTY;  Surgeon: Adrian Prows, MD;  Location: [place] [facility];  Service: Cardiovascular;  Laterality: N/A;  . CORONARY BALLOON ANGIOPLASTY N/A 01/11/2019   Procedure: CORONARY BALLOON ANGIOPLASTY;  Surgeon: Adrian Prows, MD;  Location: [place] [facility];  Service: Cardiovascular;  Laterality: N/A;  . CORONARY STENT INTERVENTION N/A 08/16/2017   Procedure: CORONARY STENT INTERVENTION;  Surgeon: Nigel Mormon, MD;  Location: [place] [facility];  Service: Cardiovascular;  Laterality: N/A;  . ELBOW SURGERY Left ?2001   "pinched nerve"  . ENDOSCOPIC MUCOSAL RESECTION  12/18/2018   Procedure: ENDOSCOPIC MUCOSAL RESECTION;  Surgeon: Rush Landmark Telford Nab., MD;  Location: [place];  Service: Gastroenterology;;  . Otho Darner SIGMOIDOSCOPY N/A 12/18/2018   Procedure: Beryle Quant;  Surgeon: Irving Copas., MD;  Location: [place];  Service: Gastroenterology;  Laterality: N/A;  . HEMOSTASIS CLIP PLACEMENT  12/18/2018   Procedure: HEMOSTASIS CLIP PLACEMENT;  Surgeon: Irving Copas., MD;  Location: [place];  Service: Gastroenterology;;  . LEFT HEART CATH AND CORONARY ANGIOGRAPHY N/A 01/11/2019   Procedure: LEFT HEART CATH AND CORONARY ANGIOGRAPHY;  Surgeon: Adrian Prows, MD;  Location: [place] [facility];  Service: Cardiovascular;  Laterality: N/A;  . LEFT HEART CATH AND CORS/GRAFTS ANGIOGRAPHY N/A 08/04/2017   Procedure: LEFT HEART CATH AND CORS/GRAFTS ANGIOGRAPHY;  Surgeon: Adrian Prows, MD;  Location:  [place] [facility];  Service: Cardiovascular;  Laterality: N/A;  . LEFT HEART CATH AND CORS/GRAFTS ANGIOGRAPHY N/A 08/20/2017   Procedure: LEFT HEART CATH AND CORS/GRAFTS ANGIOGRAPHY;  Surgeon: Nigel Mormon, MD;  Location: [place] [facility];  Service: Cardiovascular;  Laterality: N/A;  . LEFT HEART CATH AND CORS/GRAFTS ANGIOGRAPHY N/A 01/08/2019   Procedure: LEFT HEART CATH AND CORS/GRAFTS ANGIOGRAPHY;  Surgeon: Wellington Hampshire, MD;  Location: [place] [facility];  Service: Cardiovascular;  Laterality: N/A;  . POLYPECTOMY  09/19/2018   Procedure: POLYPECTOMY;  Surgeon: Thornton Park, MD;  Location: [facility];  Service: Gastroenterology;;  . Lia Foyer INJECTION  09/19/2018   Procedure: SUBMUCOSAL INJECTION;  Surgeon: Thornton Park, MD;  [facility]: [facility];  Service: Gastroenterology;;  . Lia Foyer LIFTING INJECTION  12/18/2018   Procedure: SUBMUCOSAL LIFTING INJECTION;  Surgeon: Irving Copas., MD;  Location: [facility];  Service: Gastroenterology;;   Social History   Tobacco Use  . Smoking status: Former Smoker    Packs/day: 0.25    Years: 27.00    Pack years: 6.75    Types: Cigars    Quit date: 2002    Years since quitting: 19.2  . Smokeless tobacco: Former Systems developer  Quit date: 2002  Substance Use Topics  . Alcohol use: Not Currently    ROS  Review of Systems  Cardiovascular: Positive for dyspnea on exertion. Negative for chest pain and leg swelling.  Gastrointestinal: Negative for melena.   Objective  Blood pressure 112/73, pulse 84, temperature 97.6 F (36.4 C), temperature source Temporal, resp. rate 16, height _0  (1.702 m), weight 221 lb 8 oz (100.5 kg), SpO2 96 %.  Vitals with BMI 04/16/2019 03/27/2019 03/06/2019  Height _1  _2  5' 7.75"  Weight 221 lbs 8 oz 219 lbs 213 lbs 3 oz  BMI 34.68 82.50 53.97  Systolic 673 419 -  Diastolic 73 94 -  Pulse 84 81 -     Physical Exam  Constitutional: Vital signs are normal. He does not appear  ill.  He is moderately built and mildly obese in no acute distress.  Cardiovascular: Normal rate, regular rhythm, normal heart sounds and intact distal pulses.  Pulses:      Femoral pulses are 2+ on the right side and 2+ on the left side.      Popliteal pulses are 1+ on the right side and 1+ on the left side.       Dorsalis pedis pulses are 0 on the right side and 0 on the left side.       Posterior tibial pulses are 0 on the right side and 0 on the left side.  No JVD.  1-2+ bilateral below knee pitting edema  Pulmonary/Chest: Effort normal and breath sounds normal. No accessory muscle usage. No respiratory distress.  Abdominal: Soft. Bowel sounds are normal. A hernia is present. Hernia confirmed positive in the ventral area (reducible).  obese  Vitals reviewed.  Laboratory examination:   Recent Labs    01/07/19 0443 01/07/19 0443 01/08/19 2314 01/09/19 0546 01/11/19 0604  NA 133*  --   --  136 134*  K 4.5  --   --  4.7 4.7  CL 101  --   --  104 103  CO2 23  --   --  23 23  GLUCOSE 164*  --   --  125* 120*  BUN 45*  --   --  32* 29*  CREATININE 1.50*   < > 1.60* 1.61* 1.49*  CALCIUM 9.0  --   --  9.5 8.8*  GFRNONAA 49*   < > 45* 45* 49*  GFRAA 57*   < > 52* 52* 57*   < > = values in this interval not displayed.   CrCl cannot be calculated (Patient's most recent lab result is older than the maximum 21 days allowed.).  CMP Latest Ref Rng & Units 01/11/2019 01/09/2019 01/08/2019  Glucose 70 - 99 mg/dL 120(H) 125(H) -  BUN 8 - 23 mg/dL 29(H) 32(H) -  Creatinine 0.61 - 1.24 mg/dL 1.49(H) 1.61(H) 1.60(H)  Sodium 135 - 145 mmol/L 134(L) 136 -  Potassium 3.5 - 5.1 mmol/L 4.7 4.7 -  Chloride 98 - 111 mmol/L 103 104 -  CO2 22 - 32 mmol/L 23 23 -  Calcium 8.9 - 10.3 mg/dL 8.8(L) 9.5 -  Total Protein 6.5 - 8.1 g/dL - - -  Total Bilirubin 0.3 - 1.2 mg/dL - - -  Alkaline Phos 38 - 126 U/L - - -  AST 15 - 41 U/L - - -  ALT 0 - 44 U/L - - -   CBC Latest Ref Rng & Units 01/11/2019  01/08/2019 01/08/2019  WBC 4.0 - 10.5  K/uL 7.9 7.5 8.0  Hemoglobin 13.0 - 17.0 g/dL 11.3(L) 12.6(L) 12.6(L)  Hematocrit 39.0 - 52.0 % 33.2(L) 37.9(L) 37.4(L)  Platelets 150 - 400 K/uL 181 198 200   Lipid Panel     Component Value Date/Time   CHOL 142 01/04/2019 0640   TRIG 148 01/04/2019 0640   HDL 45 01/04/2019 0640   CHOLHDL 3.2 01/04/2019 0640   VLDL 30 01/04/2019 0640   LDLCALC 67 01/04/2019 0640   LDLDIRECT 71.0 10/30/2018 1030   HEMOGLOBIN A1C Lab Results  Component Value Date   HGBA1C 11.6 (H) 12/27/2018   MPG 263 07/12/2015   TSH No results for input(s): TSH in the last 8760 hours.  Medications and allergies   Allergies  Allergen Reactions  . Diltiazem Rash     Current Outpatient Medications  Medication Instructions  . acetaminophen (TYLENOL) 650 mg, Oral, Every 4 hours PRN  . amiodarone (PACERONE) 200 mg, Oral, Daily  . aspirin 81 mg, Oral, Daily  . carvedilol (COREG) 6.25 mg, Oral, 2 times daily with meals  . Cholecalciferol (VITAMIN D3) 50 MCG (2000 UT) TABS Oral, 2 times daily  . empagliflozin (JARDIANCE) 25 mg, Oral, Daily  . ezetimibe (ZETIA) 10 mg, Oral, Daily at bedtime  . Fish Oil 1,000 mg, Oral, 2 times daily  . furosemide (LASIX) 40 mg, Oral, Daily  . insulin aspart protamine- aspart (NOVOLOG MIX 70/30) (70-30) 100 UNIT/ML injection 25 Units, Subcutaneous, 2 times daily with meals  . isosorbide mononitrate (IMDUR) 30 mg, Oral, Daily  . liraglutide (VICTOZA) 1.8 mg, Subcutaneous, Daily  . metFORMIN (GLUCOPHAGE) 500 mg, Oral, 2 times daily with meals, Patient reports prescribed  4 times per day with meals- this is how patient report taking   . nitroGLYCERIN (NITROSTAT) 0.4 mg, Sublingual, Every 5 min x3 PRN  . potassium chloride SA (KLOR-CON) 20 MEQ tablet 20 mEq, Oral, 2 times daily  . PRESCRIPTION MEDICATION Inhalation, Daily at bedtime, CPAP  . rosuvastatin (CRESTOR) 40 mg, Oral, Daily at bedtime  . sacubitril-valsartan (ENTRESTO) 97-103 MG 1  tablet, Oral, 2 times daily  . sertraline (ZOLOFT) 50 mg, Oral, Daily at bedtime  . ticagrelor (BRILINTA) 90 mg, Oral, 2 times daily   Radiology:  No results found.  Cardiac Studies:   Renal artery duplex  09/02/2017: Right: Abnormal right Resistive Index. No evidence of right renal  artery stenosis. Left:  Normal left Resistive Index. No evidence of left renal artery stenosis. Mesenteric: Normal Celiac artery findings. 70 to 99% stenosis in the superior mesenteric artery.  Echocardiogram 01/04/2019:  1. Left ventricular ejection fraction, by visual estimation, is 25 to 30%. The left ventricle has severely decreased function. There is no left ventricular hypertrophy. Definity contrast agent was given IV to delineate the left ventricular endocardial borders.  Left ventricular diastolic parameters are consistent with Grade II diastolic dysfunction (pseudonormalization).  Mildly dilated left ventricular internal cavity size. The left ventricle demonstrates global hypokinesis. 2. RV not well visualized.  3. Left atrial size was mildly dilated.  Left Heart Catheterization 01/08/19:  1. Severe underlying three-vessel coronary artery disease with patent LIMA to LAD. Chronically occluded SVG to OM. Severe in-stent restenosis in left main stent extending into the proximal left circumflex. In addition, there is significant restenosis in the stent placed in the left posterior AV groove artery. The RCA is known to be small in size and severely diseased. 2. Left ventricular angiography was not performed due to chronic kidney disease. LVEDP was 13 mmHg.  Coronary angioplasty of  the left main and mid circumflex coronary artery 01/11/2019: Successful Wolverine cutting balloon angioplasty of the left main, 3.5 x 10 mm balloon utilized and high-pressure 12 atmospheric pressure inflations performed throughout the in-stent restenotic left main and proximal ostial circumflex and also mid circumflex  coronary artery, 99% reduced to 0% with TIMI II to TIMI-3 flow. 60 mill contrast utilized.  Scheduled Remote ICD check 02.24.2021: No VT/VF episodes. No therapy. Ventricular sensing episodes detected, likely correlating with SVT vs sinus tachycardia. Health trends (patient activity, heart rate variability, average heart rates) are stable. Trans-thoracic impedance trends and the Optivol Fluid Index do no present significant abnormalities. Battery longevity is 3.5 years. RA pacing is 47.8 %, RV pacing is 98.9 %, and LV pacing is 99.8 %.  EKG: EKG 01/16/2019: AV paced rhythm at rate of 77 bpm, no further analysis.     Assessment     ICD-10-CM   1. Chronic combined systolic and diastolic heart failure (HCC)  I50.42 Brain natriuretic peptide  2. Coronary artery disease involving native coronary artery of native heart without angina pectoris  I25.10 Lipid Panel With LDL/HDL Ratio  3. Coronary artery disease involving autologous artery coronary bypass graft without angina pectoris  I25.810   4. ICD (implantable cardioverter-defibrillator) in place  Z95.810   5. Essential hypertension  I10   6. High risk medication use  Z79.899 TSH  7. Uncontrolled type 2 diabetes with stage 3 chronic kidney disease GFR 30-59 (HCC)  E11.22 Hgb A1c w/o eAG   E11.65 CMP14+EGFR   N18.30     Recommendations:  No orders of the defined types were placed in this encounter.   Martin Bush  is a 64 y.o. Caucasian male with CAD and history of CABG x2 in 2002 with only LIMA to LAD being patent, Circumflex coronary artery is super dominant. H/O  Left main stenting in 2015 and in 2016 when he had complex PCI to in-stent restenotic circumflex/OM bifurcation, due to recurrent restenosis, underwent stenting to left main and circumflex proximal and midsegment with implantation of 2 overlapping 3.5 x 20 and 3.0 x 20 mm  Synergy DES on 08/16/2017 when he presented with decompensated heart failure and recurrent VT.  Similar  presentation on 01/03/2019, coronary angiography on 01/04/2018 revealing severely restenotic left main stent that extends into the circumflex proximal and also old mid circumflex stent and successful Wolverine 3.5 mm cutting balloon angioplasty on 01/11/19.   Past medical history significant for uncontrolled diabetes mellitus, hypertension, chronic systolic and diastolic heart failure, stage III chronic kidney disease, obstructive sleep apnea on CPAP.    Although presently doing well, he has developed mild leg edema and this could be early onset of congestive heart failure.  As his wife recently had shoulder surgery they have been eating out and not cooking at home and we discussed regarding acute decompensation of heart failure again.  I also reviewed his ICD data, normal function and also no e/o CHF.  Continue dual antiplatelet therapy with aspirin and Brilinta, I will consider discontinuing this on his next office visit and continue aspirin alone.  He has not had a follow-up with his PCP, I urged him to make an appointment.  I will obtain labs and I will forward him a copy including A1c.  He needs therapeutic drug monitoring especially since being on amiodarone.    In view of extreme high risk for recurrent hospitalization, severe LV systolic dysfunction, risk for acute CHF exacerbation, I will place him on PCM (chronic care  management).  Continue present medications and will see him in 3 months.   Adrian Prows, MD, [facility] 04/16/2019, 12:26 PM [facility] Pager: [phone] Office: [phone]

## 2019-04-18 ENCOUNTER — Encounter: Payer: Self-pay | Admitting: *Deleted

## 2019-04-18 ENCOUNTER — Other Ambulatory Visit: Payer: Self-pay

## 2019-04-18 ENCOUNTER — Encounter: Payer: Medicare Other | Admitting: *Deleted

## 2019-04-18 DIAGNOSIS — G4733 Obstructive sleep apnea (adult) (pediatric): Secondary | ICD-10-CM | POA: Diagnosis not present

## 2019-04-18 DIAGNOSIS — I214 Non-ST elevation (NSTEMI) myocardial infarction: Secondary | ICD-10-CM

## 2019-04-18 DIAGNOSIS — E7800 Pure hypercholesterolemia, unspecified: Secondary | ICD-10-CM | POA: Diagnosis not present

## 2019-04-18 DIAGNOSIS — E118 Type 2 diabetes mellitus with unspecified complications: Secondary | ICD-10-CM | POA: Diagnosis not present

## 2019-04-18 DIAGNOSIS — I2510 Atherosclerotic heart disease of native coronary artery without angina pectoris: Secondary | ICD-10-CM | POA: Diagnosis not present

## 2019-04-18 DIAGNOSIS — I110 Hypertensive heart disease with heart failure: Secondary | ICD-10-CM | POA: Diagnosis not present

## 2019-04-18 DIAGNOSIS — Z9861 Coronary angioplasty status: Secondary | ICD-10-CM

## 2019-04-18 DIAGNOSIS — E1122 Type 2 diabetes mellitus with diabetic chronic kidney disease: Secondary | ICD-10-CM | POA: Diagnosis not present

## 2019-04-18 DIAGNOSIS — Z87891 Personal history of nicotine dependence: Secondary | ICD-10-CM | POA: Diagnosis not present

## 2019-04-18 DIAGNOSIS — Z7982 Long term (current) use of aspirin: Secondary | ICD-10-CM | POA: Diagnosis not present

## 2019-04-18 DIAGNOSIS — Z794 Long term (current) use of insulin: Secondary | ICD-10-CM | POA: Diagnosis not present

## 2019-04-18 DIAGNOSIS — E1165 Type 2 diabetes mellitus with hyperglycemia: Secondary | ICD-10-CM | POA: Diagnosis not present

## 2019-04-18 DIAGNOSIS — Z7901 Long term (current) use of anticoagulants: Secondary | ICD-10-CM | POA: Diagnosis not present

## 2019-04-18 DIAGNOSIS — Z79899 Other long term (current) drug therapy: Secondary | ICD-10-CM | POA: Diagnosis not present

## 2019-04-18 DIAGNOSIS — I5042 Chronic combined systolic (congestive) and diastolic (congestive) heart failure: Secondary | ICD-10-CM | POA: Diagnosis not present

## 2019-04-18 NOTE — Progress Notes (Signed)
Daily Session Note  Patient Details  Name: Mal Asher MRN: [id number] Date of Birth: 05/03/1955 Referring Provider:     Cardiac Rehab from 03/06/2019 in [facility]  Referring Provider  Adrian Prows MD      Encounter Date: 04/18/2019  Check In: Session Check In - 04/18/19 1556      Check-In   Supervising physician immediately available to respond to emergencies  See telemetry face sheet for immediately available ER MD    Location  [facility]-Cardiac & Pulmonary Re[facility]    Staff Present  Heath Lark, RN, BSN, CCRP;[name] Caiola RDN, LDN;Meredith Sherryll Burger, RN BSN    Virtual Visit  No    Medication changes reported      No    Fall or balance concerns reported     No    Warm-up and Cool-down  Performed on first and last piece of equipment    Resistance Training Performed  Yes    VAD Patient?  No    PAD/SET Patient?  No      Pain Assessment   Currently in Pain?  No/denies          Social History   Tobacco Use  Smoking Status Former Smoker  . Packs/day: 0.25  . Years: 27.00  . Pack years: 6.75  . Types: Cigars  . Quit date: 2002  . Years since quitting: 19.2  Smokeless Tobacco Former Systems developer  . Quit date: 2002    Goals Met:  Independence with exercise equipment Exercise tolerated well No report of cardiac concerns or symptoms  Goals Unmet:  Not Applicable  Comments: Pt able to follow exercise prescription today without complaint.  Will continue to monitor for progression.    Dr. Emily Filbert is Medical Director for [place] and [facility].

## 2019-04-18 NOTE — Progress Notes (Signed)
Cardiac Individual Treatment Plan  Patient Details  Name: Martin Bush MRN: [id number] Date of Birth: [birth year] Referring Provider:     Cardiac Rehab from 03/06/2019 in [facility] Cardiac and Pulmonary Rehab  Referring Provider  Adrian Prows MD      Initial Encounter Date:    Cardiac Rehab from 03/06/2019 in [facility] Cardiac and Pulmonary Rehab  Date  03/06/19      Visit Diagnosis: NSTEMI (non-ST elevated myocardial infarction) ([place])  S/P PTCA (percutaneous transluminal coronary angioplasty)  Patient's Home Medications on Admission:  Current Outpatient Medications:  .  acetaminophen (TYLENOL) 325 MG tablet, Take 2 tablets (650 mg total) by mouth every 4 (four) hours as needed for headache or mild pain., Disp:  , Rfl:  .  amiodarone (PACERONE) 400 MG tablet, Take 0.5 tablets (200 mg total) by mouth daily. (Patient taking differently: Take 400 mg by mouth daily. 1/2 tablet in the morning, 1/2 tablet in the evening), Disp:  , Rfl:  .  aspirin EC 81 MG EC tablet, Take 1 tablet (81 mg total) by mouth daily., Disp:  , Rfl:  .  carvedilol (COREG) 6.25 MG tablet, Take 1 tablet (6.25 mg total) by mouth 2 (two) times daily with a meal., Disp:  , Rfl:  .  Cholecalciferol (VITAMIN D3) 50 MCG (2000 UT) TABS, Take by mouth 2 (two) times daily., Disp: , Rfl:  .  empagliflozin (JARDIANCE) 25 MG TABS tablet, Take 25 mg by mouth daily., Disp: , Rfl:  .  ezetimibe (ZETIA) 10 MG tablet, Take 1 tablet (10 mg total) by mouth at bedtime., Disp:  , Rfl:  .  furosemide (LASIX) 40 MG tablet, Take 40 mg by mouth daily., Disp: , Rfl:  .  insulin aspart protamine- aspart (NOVOLOG MIX 70/30) (70-30) 100 UNIT/ML injection, Inject 0.25 mLs (25 Units total) into the skin 2 (two) times daily with a meal. (Patient taking differently: Inject 63 Units into the skin 2 (two) times daily with a meal. ), Disp: 10 mL, Rfl: 11 .  isosorbide mononitrate (IMDUR) 30 MG 24 hr tablet, Take 1 tablet (30 mg total) by mouth daily., Disp:  , Rfl:   .  liraglutide (VICTOZA) 18 MG/3ML SOPN, Inject 1.8 mg into the skin daily., Disp: , Rfl:  .  metFORMIN (GLUCOPHAGE) 500 MG tablet, Take 500 mg by mouth 2 (two) times daily with a meal. Patient reports prescribed  4 times per day with meals- this is how patient report taking, Disp: , Rfl:  .  nitroGLYCERIN (NITROSTAT) 0.4 MG SL tablet, Place 1 tablet (0.4 mg total) under the tongue every 5 (five) minutes x 3 doses as needed for chest pain., Disp:  , Rfl: 12 .  Omega-3 Fatty Acids (FISH OIL) 1000 MG CAPS, Take 1,000 mg by mouth 2 (two) times daily. , Disp: , Rfl:  .  potassium chloride SA (KLOR-CON) 20 MEQ tablet, Take 1 tablet (20 mEq total) by mouth 2 (two) times daily., Disp:  , Rfl:  .  PRESCRIPTION MEDICATION, Inhale into the lungs at bedtime. CPAP, Disp: , Rfl:  .  rosuvastatin (CRESTOR) 40 MG tablet, Take 1 tablet (40 mg total) by mouth at bedtime., Disp:  , Rfl:  .  sacubitril-valsartan (ENTRESTO) 97-103 MG, Take 1 tablet by mouth 2 (two) times daily., Disp: , Rfl:  .  sertraline (ZOLOFT) 50 MG tablet, Take 1 tablet (50 mg total) by mouth at bedtime., Disp:  , Rfl:  .  ticagrelor (BRILINTA) 90 MG TABS tablet, Take 1 tablet (  90 mg total) by mouth 2 (two) times daily., Disp: 60 tablet, Rfl:   Past Medical History: Past Medical History:  Diagnosis Date  . AICD (automatic cardioverter/defibrillator) present    Medtronic  . Arthritis    "thumbs"  (08/02/2017)  . CHF (congestive heart failure) ([place])   . Chronic combined systolic and diastolic heart failure ([place])    a. 08/2017 Echo: EF 20-25%, mod glob HK. Sev distal ant sept, inflat HK. Apical AK. Gr2 DD. Mildly reduced RV fxn.  . Colon polyps   . Coronary artery disease    a. s/p CABG x 2 (LIMA->LAD, VG->OM); b. Multiple PCI's to LM/LCX/OM; c. 07/2017 PTCA of LM/LCX w/ early ISR-->repeat PCI/DES to LM (3.5x20 Synergy DES) and LCX (3.0x20 Synergy DES); d. 07/2018 Relook Cath: LM patent stent, LAD 100ost, LCX patent stent, OM1 99/60, LIMA->LAD  ok. VG->dLCX 36 (old).  . Depression   . Encounter for assessment of implantable cardioverter-defibrillator (ICD) 09/27/2018  . High cholesterol   . Hypertension   . ICD; Biventricular  Medtronic ICD Amplia MRI QWuad CRT-D  in situ 10/29/14 10/29/2014   Remote ICD check 09.23.20:  One 6 beat NSVT. No therapy.  1 SVT episode @ 130 bpm (38 Sec).  There were 23 Vent sense episodes detected for up to 1.1 min/day (AT). Health trends (patient activity, heart rate variability, average heart rates) are stable.Trans-thoracic impedance trends and the OptiVol Fluid Index do no present significant abnormalities. Battery longevity is 4.3 years. RA pacing is 3  . Ischemic cardiomyopathy 09/27/2018  . MI (myocardial infarction) ([place]) 2003  . NSTEMI (non-ST elevated myocardial infarction) ([place]) 07/16/2014  . OSA on CPAP   . Oxygen deficiency   . Pneumonia 10/2014  . Proteinuria   . Sleep apnea   . 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 Packs/day: 0.25  . Years: 27.00  . Pack years: 6.75  . Types: Cigars  . Quit date: 2002  . Years since quitting: 19.2  Smokeless Tobacco Former Systems developer  . Quit date: 2002    Labs: Recent Review Flowsheet Data    Labs for Martin Bush Cardiac and Pulmonary Rehab Latest Ref Rng & Units 10/30/2018 11/21/2018 12/27/2018 01/03/2019 01/04/2019   Cholestrol 0 - 200 mg/dL 144 - 103 - 142   LDLCALC 0 - 99 mg/dL - - 34 - 67   LDLDIRECT mg/dL 71.0 - - - -   HDL >40 mg/dL 39.60 - 32.10(L) - 45   Trlycerides <150 mg/dL 240.0(H) - 182.0(H) - 148   Hemoglobin A1c 4.6 - 6.5 % - 14.7 11.6(H) - -   PHART 7.350 - 7.450 - - - - -   PCO2ART 32.0 - 48.0 mmHg - - - - -   HCO3 20.0 - 28.0 mmol/L - - - 22.9 -   TCO2 22 - 32 mmol/L - - - - -   ACIDBASEDEF 0.0 - 2.0 mmol/L - - - 5.5(H) -   O2SAT % - - - 92.7 -       Exercise Target Goals: Exercise Program Goal: Individual exercise prescription set  using results from initial 6 min walk test and THRR while considering  patient's activity barriers and safety.   Exercise Prescription Goal: Initial exercise prescription builds to 30-45 minutes a day of aerobic activity, 2-3 days per week.  Home exercise guidelines will be given to patient during program as part  of exercise prescription that the participant will acknowledge.  Activity Barriers & Risk Stratification: Activity Barriers & Cardiac Risk Stratification - 03/06/19 1527      Activity Barriers & Cardiac Risk Stratification   Activity Barriers  Arthritis;Back Problems;Deconditioning;Muscular Weakness;Joint Problems;Balance Concerns   arth is thumbs and back   Cardiac Risk Stratification  High       6 Minute Walk: 6 Minute Walk    Row Name 03/06/19 1526         6 Minute Walk   Phase  Initial     Distance  1325 feet     Walk Time  6 minutes     # of Rest Breaks  0     MPH  2.51     METS  3.18     RPE  13     Perceived Dyspnea   1     VO2 Peak  11.15     Symptoms  Yes (comment)     Comments  SOB     Resting HR  65 bpm     Resting BP  124/70     Resting Oxygen Saturation   97 %     Exercise Oxygen Saturation  during 6 min walk  98 %     Max Ex. HR  110 bpm     Max Ex. BP  138/74     2 Minute Post BP  128/74        Oxygen Initial Assessment:   Oxygen Re-Evaluation:   Oxygen Discharge (Final Oxygen Re-Evaluation):   Initial Exercise Prescription: Initial Exercise Prescription - 03/06/19 1500      Date of Initial Exercise RX and Referring Provider   Date  03/06/19    Referring Provider  Adrian Prows MD      Treadmill   MPH  2.3    Grade  0.5    Minutes  15    METs  2.9      NuStep   Level  2    SPM  80    Minutes  15    METs  2      Biostep-RELP   Level  2    SPM  50    Minutes  15    METs  2      Prescription Details   Frequency (times per week)  3    Duration  Progress to 30 minutes of continuous aerobic without signs/symptoms of  physical distress      Intensity   THRR 40-80% of Max Heartrate  102-139    Ratings of Perceived Exertion  11-13    Perceived Dyspnea  0-4      Progression   Progression  Continue to progress workloads to maintain intensity without signs/symptoms of physical distress.      Resistance Training   Training Prescription  Yes    Weight  3 lb       Perform Capillary Blood Glucose checks as needed.  Exercise Prescription Changes: Exercise Prescription Changes    Row Name 03/06/19 1500 03/15/19 0700 03/28/19 1800 03/29/19 1500 04/10/19 1500     Response to Exercise   Blood Pressure (Admit)  124/70  98/60  126/74  --  138/82   Blood Pressure (Exercise)  138/74  144/60  124/70  --  140/80   Blood Pressure (Exit)  128/74  90/52  122/70  --  128/68   Heart Rate (Admit)  65 bpm  82 bpm  91 bpm  --  86 bpm   Heart Rate (Exercise)  110 bpm  92 bpm  102 bpm  --  97 bpm   Heart Rate (Exit)  75 bpm  71 bpm  81 bpm  --  75 bpm   Oxygen Saturation (Admit)  97 %  --  --  --  --   Oxygen Saturation (Exercise)  98 %  --  --  --  --   Rating of Perceived Exertion (Exercise)  '13  15  14  ' --  12   Perceived Dyspnea (Exercise)  1  --  --  --  --   Symptoms  SOB  back pain on TM  none  --  none   Comments  walk test results  second full day of exericse  --  --  --   Duration  --  Continue with 30 min of aerobic exercise without signs/symptoms of physical distress.  Continue with 30 min of aerobic exercise without signs/symptoms of physical distress.  --  Continue with 30 min of aerobic exercise without signs/symptoms of physical distress.   Intensity  --  THRR unchanged  THRR unchanged  --  THRR unchanged     Progression   Progression  --  Continue to progress workloads to maintain intensity without signs/symptoms of physical distress.  Continue to progress workloads to maintain intensity without signs/symptoms of physical distress.  --  Continue to progress workloads to maintain intensity without  signs/symptoms of physical distress.   Average METs  --  2.38  2.8  --  3.2     Resistance Training   Training Prescription  --  Yes  Yes  --  Yes   Weight  --  3 lb  3 lb  --  3 lb   Reps  --  10-15  10-15  --  10-15     Interval Training   Interval Training  --  No  No  --  No     Treadmill   MPH  --  1.5  2.3  --  1.3   Grade  --  0.5  0.5  --  0.5   Minutes  --  15  15  --  15   METs  --  2.25  2.9  --  2.9     NuStep   Level  --  2  4  --  4   SPM  --  --  80  --  --   Minutes  --  15  15  --  15   METs  --  2.9  2.7  --  3.2     Biostep-RELP   Level  --  2  --  --  4   Minutes  --  15  --  --  15   METs  --  2  --  --  3.7     Home Exercise Plan   Plans to continue exercise at  --  --  --  Home (comment) walking, rec bike  Home (comment) walking, rec bike   Frequency  --  --  --  Add 2 additional days to program exercise sessions.  Add 2 additional days to program exercise sessions.   Initial Home Exercises Provided  --  --  --  03/29/19  03/29/19      Exercise Comments:   Exercise Goals and Review: Exercise Goals    Row Name 03/06/19 1538  Exercise Goals   Increase Physical Activity  Yes       Intervention  Provide advice, education, support and counseling about physical activity/exercise needs.;Develop an individualized exercise prescription for aerobic and resistive training based on initial evaluation findings, risk stratification, comorbidities and participant's personal goals.       Expected Outcomes  Short Term: Attend rehab on a regular basis to increase amount of physical activity.;Long Term: Add in home exercise to make exercise part of routine and to increase amount of physical activity.;Long Term: Exercising regularly at least 3-5 days a week.       Increase Strength and Stamina  Yes       Intervention  Provide advice, education, support and counseling about physical activity/exercise needs.;Develop an individualized exercise  prescription for aerobic and resistive training based on initial evaluation findings, risk stratification, comorbidities and participant's personal goals.       Expected Outcomes  Short Term: Increase workloads from initial exercise prescription for resistance, speed, and METs.;Short Term: Perform resistance training exercises routinely during rehab and add in resistance training at home;Long Term: Improve cardiorespiratory fitness, muscular endurance and strength as measured by increased METs and functional capacity (6MWT)       Able to understand and use rate of perceived exertion (RPE) scale  Yes       Intervention  Provide education and explanation on how to use RPE scale       Expected Outcomes  Short Term: Able to use RPE daily in rehab to express subjective intensity level;Long Term:  Able to use RPE to guide intensity level when exercising independently       Able to understand and use Dyspnea scale  Yes       Intervention  Provide education and explanation on how to use Dyspnea scale       Expected Outcomes  Short Term: Able to use Dyspnea scale daily in rehab to express subjective sense of shortness of breath during exertion;Long Term: Able to use Dyspnea scale to guide intensity level when exercising independently       Knowledge and understanding of Target Heart Rate Range (THRR)  Yes       Intervention  Provide education and explanation of THRR including how the numbers were predicted and where they are located for reference       Expected Outcomes  Short Term: Able to state/look up THRR;Short Term: Able to use daily as guideline for intensity in rehab;Long Term: Able to use THRR to govern intensity when exercising independently       Able to check pulse independently  Yes       Intervention  Provide education and demonstration on how to check pulse in carotid and radial arteries.;Review the importance of being able to check your own pulse for safety during independent exercise        Expected Outcomes  Short Term: Able to explain why pulse checking is important during independent exercise;Long Term: Able to check pulse independently and accurately       Understanding of Exercise Prescription  Yes       Intervention  Provide education, explanation, and written materials on patient's individual exercise prescription       Expected Outcomes  Short Term: Able to explain program exercise prescription;Long Term: Able to explain home exercise prescription to exercise independently          Exercise Goals Re-Evaluation : Exercise Goals Re-Evaluation    Row Name 03/08/19 1624 03/15/19 0743 03/28/19 1809  03/29/19 1513 04/10/19 1557     Exercise Goal Re-Evaluation   Exercise Goals Review  Increase Physical Activity;Able to understand and use rate of perceived exertion (RPE) scale;Knowledge and understanding of Target Heart Rate Range (THRR);Understanding of Exercise Prescription;Increase Strength and Stamina;Able to check pulse independently  Increase Physical Activity;Increase Strength and Stamina;Understanding of Exercise Prescription  Increase Physical Activity;Increase Strength and Stamina;Able to understand and use rate of perceived exertion (RPE) scale;Able to understand and use Dyspnea scale;Knowledge and understanding of Target Heart Rate Range (THRR);Able to check pulse independently;Understanding of Exercise Prescription  Increase Physical Activity;Increase Strength and Stamina;Able to understand and use rate of perceived exertion (RPE) scale;Able to understand and use Dyspnea scale;Knowledge and understanding of Target Heart Rate Range (THRR);Able to check pulse independently;Understanding of Exercise Prescription  Increase Physical Activity;Increase Strength and Stamina;Understanding of Exercise Prescription   Comments  Reviewed RPE scale, THR and program prescription with pt today.  Pt voiced understanding and was given a copy of goals to take home.  Martin Bush is off to a good start in  rehab.  He has completed his first two full days of exercise.  He was only able to get 8 min on the treadmill.  We will continue to work with him on this.  We will continue to monitor  his progress.  Martin Bush has progressed to level 4 on NS and level 3 on Biostep.  Staff will monitor progress.  Reviewed home exercise with pt today.  Pt plans to walk and use bike at home for exercise.  Also talked about using staff videos. Reviewed THR, pulse, RPE, sign and symptoms, NTG use, and when to call 911 or MD.  Also discussed weather considerations and indoor options.  Pt voiced understanding.  Martin Bush is doing well in rehab.  He is up to level 4 on the NuStep. We will continue to montior his progress.   Expected Outcomes  Short: Use RPE daily to regulate intensity. Long: Follow program prescription in THR.  Short: Continue to attend rehab regularly  Long: Continue to improve stamina.  Short : continue to build stamina on TM Long:  improve overall MET level  Short: Start to add in walking at home.  Long: Continue to improve stamina.  Short: Increase weights  Long: Continue to improve stamina.      Discharge Exercise Prescription (Final Exercise Prescription Changes): Exercise Prescription Changes - 04/10/19 1500      Response to Exercise   Blood Pressure (Admit)  138/82    Blood Pressure (Exercise)  140/80    Blood Pressure (Exit)  128/68    Heart Rate (Admit)  86 bpm    Heart Rate (Exercise)  97 bpm    Heart Rate (Exit)  75 bpm    Rating of Perceived Exertion (Exercise)  12    Symptoms  none    Duration  Continue with 30 min of aerobic exercise without signs/symptoms of physical distress.    Intensity  THRR unchanged      Progression   Progression  Continue to progress workloads to maintain intensity without signs/symptoms of physical distress.    Average METs  3.2      Resistance Training   Training Prescription  Yes    Weight  3 lb    Reps  10-15      Interval Training   Interval Training  No       Treadmill   MPH  1.3    Grade  0.5    Minutes  15    METs  2.9      NuStep   Level  4    Minutes  15    METs  3.2      Biostep-RELP   Level  4    Minutes  15    METs  3.7      Home Exercise Plan   Plans to continue exercise at  Home (comment)   walking, rec bike   Frequency  Add 2 additional days to program exercise sessions.    Initial Home Exercises Provided  03/29/19       Nutrition:  Target Goals: Understanding of nutrition guidelines, daily intake of sodium <1560m, cholesterol <2060m calories 30% from fat and 7% or less from saturated fats, daily to have 5 or more servings of fruits and vegetables.  Biometrics: Pre Biometrics - 03/06/19 1606      Pre Biometrics   Height  5' 7.75" (1.721 m)    Weight  213 lb 3.2 oz (96.7 kg)    BMI (Calculated)  32.65    Single Leg Stand  3.19 seconds        Nutrition Therapy Plan and Nutrition Goals:   Nutrition Assessments: Nutrition Assessments - 03/06/19 1607      MEDFICTS Scores   Pre Score  42       Nutrition Goals Re-Evaluation:   Nutrition Goals Discharge (Final Nutrition Goals Re-Evaluation):   Psychosocial: Target Goals: Acknowledge presence or absence of significant depression and/or stress, maximize coping skills, provide positive support system. Participant is able to verbalize types and ability to use techniques and skills needed for reducing stress and depression.   Initial Review & Psychosocial Screening: Initial Psych Review & Screening - 02/28/19 1109      Initial Review   Current issues with  History of Depression;Current Psychotropic Meds;Current Stress Concerns    Source of Stress Concerns  Chronic Illness    Comments  No current symptoms of depression on Zoloft and feels that it is working well for him, significant health history      [place] Yes   wife     Barriers   Psychosocial barriers to participate in program  The patient should benefit from  training in stress management and relaxation.;Psychosocial barriers identified (see note)      Screening Interventions   Interventions  Encouraged to exercise;To provide support and resources with identified psychosocial needs;Provide feedback about the scores to participant    Expected Outcomes  Long Term Goal: Stressors or current issues are controlled or eliminated.;Short Term goal: Identification and review with participant of any Quality of Life or Depression concerns found by scoring the questionnaire.;Short Term goal: Utilizing psychosocial counselor, staff and physician to assist with identification of specific Stressors or current issues interfering with healing process. Setting desired goal for each stressor or current issue identified.;Long Term goal: The participant improves quality of Life and PHQ9 Scores as seen by post scores and/or verbalization of changes       Quality of Life Scores:  Quality of Life - 03/06/19 1607      Quality of Life   Select  Quality of Life      Quality of Life Scores   Health/Function Pre  20.57 %    Socioeconomic Pre  27 %    Psych/Spiritual Pre  25.5 %    Family Pre  30 %    GLOBAL Pre  24.41 %  Scores of 19 and below usually indicate a poorer quality of life in these areas.  A difference of  2-3 points is a clinically meaningful difference.  A difference of 2-3 points in the total score of the Quality of Life Index has been associated with significant improvement in overall quality of life, self-image, physical symptoms, and general health in studies assessing change in quality of life.  PHQ-9: Recent Review Flowsheet Data    Depression screen [facility] 2/9 03/06/2019 01/15/2019 12/04/2018 09/29/2018 08/30/2018   Decreased Interest 0 0 0 0 0   Down, Depressed, Hopeless 0 0 0 0 0   PHQ - 2 Score 0 0 0 0 0   Altered sleeping 1 - - 0 -   Tired, decreased energy 0 - - 0 -   Change in appetite 0 - - 0 -   Feeling bad or failure about yourself  0 - - 0 -    Trouble concentrating 0 - - 0 -   Moving slowly or fidgety/restless 0 - - 0 -   Suicidal thoughts 0 - - 0 -   PHQ-9 Score 1 - - 0 -   Difficult doing work/chores Not difficult at all - - Not difficult at all -     Interpretation of Total Score  Total Score Depression Severity:  1-4 = Minimal depression, 5-9 = Mild depression, 10-14 = Moderate depression, 15-19 = Moderately severe depression, 20-27 = Severe depression   Psychosocial Evaluation and Intervention: Psychosocial Evaluation - 02/28/19 1115      Psychosocial Evaluation & Interventions   Interventions  Encouraged to exercise with the program and follow exercise prescription    Comments  Martin Bush 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  He has a Estate manager/land agent in place.  He also has uncontrolled diabetes according to office notes, but feels that he is doing well overall.  His wife is his primary support system and they try not to have too many worries.  He is looking forward to getting his heart stronger and back in better shape overall.    Expected Outcomes  Short: Attend rehab to rebuild stamina again.  Long: Continue to cope well with his health.    Continue Psychosocial Services   Follow up required by staff       Psychosocial Re-Evaluation: Psychosocial Re-Evaluation    Martin Bush Name 04/09/19 1535             Psychosocial Re-Evaluation   Current issues with  Current Stress Concerns       Comments  Martin Bush wife has had shoulder surgery and is helping taking care of her. He has back pain and it is tough to care for her at times. Overall he has a good outlook on his health.  He can look to his wife, son and daughter for support.       Expected Outcomes  Short: Attend HeartTrack stress management education to decrease stress. Long: Maintain exercise Post HeartTrack to keep stress at a minimum.       Interventions  Encouraged to attend Cardiac  Rehabilitation for the exercise       Continue Psychosocial Services   Follow up required by staff          Psychosocial Discharge (Final Psychosocial Re-Evaluation): Psychosocial Re-Evaluation - 04/09/19 1535      Psychosocial Re-Evaluation   Current issues with  Current Stress Concerns  Comments  Martin Bush wife has had shoulder surgery and is helping taking care of her. He has back pain and it is tough to care for her at times. Overall he has a good outlook on his health.  He can look to his wife, son and daughter for support.    Expected Outcomes  Short: Attend HeartTrack stress management education to decrease stress. Long: Maintain exercise Post HeartTrack to keep stress at a minimum.    Interventions  Encouraged to attend Cardiac Rehabilitation for the exercise    Continue Psychosocial Services   Follow up required by staff       Vocational Rehabilitation: Provide vocational rehab assistance to qualifying candidates.   Vocational Rehab Evaluation & Intervention: Vocational Rehab - 02/28/19 1109      Initial Vocational Rehab Evaluation & Intervention   Assessment shows need for Vocational Rehabilitation  No       Education: Education Goals: Education classes will be provided on a variety of topics geared toward better understanding of heart health and risk factor modification. Participant will state understanding/return demonstration of topics presented as noted by education test scores.  Learning Barriers/Preferences: Learning Barriers/Preferences - 02/28/19 1108      Learning Barriers/Preferences   Learning Barriers  Sight   glasses   Learning Preferences  None       Education Topics:  AED/CPR: - Group verbal and written instruction with the use of models to demonstrate the basic use of the AED with the basic ABC's of resuscitation.   General Nutrition Guidelines/Fats and Fiber: -Group instruction provided by verbal, written material, models and posters to present  the general guidelines for heart healthy nutrition. Gives an explanation and review of dietary fats and fiber.   Controlling Sodium/Reading Food Labels: -Group verbal and written material supporting the discussion of sodium use in heart healthy nutrition. Review and explanation with models, verbal and written materials for utilization of the food label.   Exercise Physiology & General Exercise Guidelines: - Group verbal and written instruction with models to review the exercise physiology of the cardiovascular system and associated critical values. Provides general exercise guidelines with specific guidelines to those with heart or lung disease.    Aerobic Exercise & Resistance Training: - Gives group verbal and written instruction on the various components of exercise. Focuses on aerobic and resistive training programs and the benefits of this training and how to safely progress through these programs..   Flexibility, Balance, Mind/Body Relaxation: Provides group verbal/written instruction on the benefits of flexibility and balance training, including mind/body exercise modes such as yoga, pilates and tai chi.  Demonstration and skill practice provided.   Stress and Anxiety: - Provides group verbal and written instruction about the health risks of elevated stress and causes of high stress.  Discuss the correlation between heart/lung disease and anxiety and treatment options. Review healthy ways to manage with stress and anxiety.   Depression: - Provides group verbal and written instruction on the correlation between heart/lung disease and depressed mood, treatment options, and the stigmas associated with seeking treatment.   Anatomy & Physiology of the Heart: - Group verbal and written instruction and models provide basic cardiac anatomy and physiology, with the coronary electrical and arterial systems. Review of Valvular disease and Heart Failure   Cardiac Procedures: - Group verbal  and written instruction to review commonly prescribed medications for heart disease. Reviews the medication, class of the drug, and side effects. Includes the steps to properly store meds and maintain the  prescription regimen. (beta blockers and nitrates)   Cardiac Medications I: - Group verbal and written instruction to review commonly prescribed medications for heart disease. Reviews the medication, class of the drug, and side effects. Includes the steps to properly store meds and maintain the prescription regimen.   Cardiac Medications II: -Group verbal and written instruction to review commonly prescribed medications for heart disease. Reviews the medication, class of the drug, and side effects. (all other drug classes)    Go Sex-Intimacy & Heart Disease, Get SMART - Goal Setting: - Group verbal and written instruction through game format to discuss heart disease and the return to sexual intimacy. Provides group verbal and written material to discuss and apply goal setting through the application of the S.M.A.R.T. Method.   Other Matters of the Heart: - Provides group verbal, written materials and models to describe Stable Angina and Peripheral Artery. Includes description of the disease process and treatment options available to the cardiac patient.   Exercise & Equipment Safety: - Individual verbal instruction and demonstration of equipment use and safety with use of the equipment.   Cardiac Rehab from 03/06/2019 in [facility] Cardiac and Pulmonary Rehab  Date  03/06/19  Educator  [facility]  Instruction Review Code  1- Verbalizes Understanding      Infection Prevention: - Provides verbal and written material to individual with discussion of infection control including proper hand washing and proper equipment cleaning during exercise session.   Cardiac Rehab from 03/06/2019 in [facility] Cardiac and Pulmonary Rehab  Date  03/06/19  Educator  [facility]  Instruction Review Code  1- Verbalizes Understanding       Falls Prevention: - Provides verbal and written material to individual with discussion of falls prevention and safety.   Cardiac Rehab from 03/06/2019 in [facility] Cardiac and Pulmonary Rehab  Date  03/06/19  Educator  [facility]  Instruction Review Code  1- Verbalizes Understanding      Diabetes: - Individual verbal and written instruction to review signs/symptoms of diabetes, desired ranges of glucose level fasting, after meals and with exercise. Acknowledge that pre and post exercise glucose checks will be done for 3 sessions at entry of program.   Know Your Numbers and Risk Factors: -Group verbal and written instruction about important numbers in your health.  Discussion of what are risk factors and how they play a role in the disease process.  Review of Cholesterol, Blood Pressure, Diabetes, and BMI and the role they play in your overall health.   Sleep Hygiene: -Provides group verbal and written instruction about how sleep can affect your health.  Define sleep hygiene, discuss sleep cycles and impact of sleep habits. Review good sleep hygiene tips.    Other: -Provides group and verbal instruction on various topics (see comments)   Knowledge Questionnaire Score: Knowledge Questionnaire Score - 03/06/19 1607      Knowledge Questionnaire Score   Pre Score  24/26 Education Focus: Angina and Nurtition       Core Components/Risk Factors/Patient Goals at Admission: Personal Goals and Risk Factors at Admission - 03/06/19 1608      Core Components/Risk Factors/Patient Goals on Admission    Weight Management  Yes;Weight Loss;Obesity    Intervention  Weight Management: Develop a combined nutrition and exercise program designed to reach desired caloric intake, while maintaining appropriate intake of nutrient and fiber, sodium and fats, and appropriate energy expenditure required for the weight goal.;Weight Management: Provide education and appropriate resources to help participant work on and  attain dietary  goals.;Weight Management/Obesity: Establish reasonable short term and long term weight goals.;Obesity: Provide education and appropriate resources to help participant work on and attain dietary goals.    Admit Weight  213 lb 3.2 oz (96.7 kg)    Goal Weight: Short Term  208 lb (94.3 kg)    Goal Weight: Long Term  200 lb (90.7 kg)    Expected Outcomes  Long Term: Adherence to nutrition and physical activity/exercise program aimed toward attainment of established weight goal;Short Term: Continue to assess and modify interventions until short term weight is achieved;Weight Loss: Understanding of general recommendations for a balanced deficit meal plan, which promotes 1-2 lb weight loss per week and includes a negative energy balance of [phone] kcal/d;Understanding recommendations for meals to include 15-35% energy as protein, 25-35% energy from fat, 35-60% energy from carbohydrates, less than 234m of dietary cholesterol, 20-35 gm of total fiber daily;Understanding of distribution of calorie intake throughout the day with the consumption of 4-5 meals/snacks    Diabetes  Yes    Intervention  Provide education about signs/symptoms and action to take for hypo/hyperglycemia.;Provide education about proper nutrition, including hydration, and aerobic/resistive exercise prescription along with prescribed medications to achieve blood glucose in normal ranges: Fasting glucose 65-99 mg/dL    Expected Outcomes  Short Term: Participant verbalizes understanding of the signs/symptoms and immediate care of hyper/hypoglycemia, proper foot care and importance of medication, aerobic/resistive exercise and nutrition plan for blood glucose control.;Long Term: Attainment of HbA1C < 7%.    Heart Failure  Yes    Intervention  Provide a combined exercise and nutrition program that is supplemented with education, support and counseling about heart failure. Directed toward relieving symptoms such as shortness of breath,  decreased exercise tolerance, and extremity edema.    Expected Outcomes  Improve functional capacity of life;Short term: Attendance in program 2-3 days a week with increased exercise capacity. Reported lower sodium intake. Reported increased fruit and vegetable intake. Reports medication compliance.;Short term: Daily weights obtained and reported for increase. Utilizing diuretic protocols set by physician.;Long term: Adoption of self-care skills and reduction of barriers for early signs and symptoms recognition and intervention leading to self-care maintenance.    Hypertension  Yes    Intervention  Provide education on lifestyle modifcations including regular physical activity/exercise, weight management, moderate sodium restriction and increased consumption of fresh fruit, vegetables, and low fat dairy, alcohol moderation, and smoking cessation.;Monitor prescription use compliance.    Expected Outcomes  Short Term: Continued assessment and intervention until BP is < 140/960mHG in hypertensive participants. < 130/8015mG in hypertensive participants with diabetes, heart failure or chronic kidney disease.    Lipids  Yes    Intervention  Provide education and support for participant on nutrition & aerobic/resistive exercise along with prescribed medications to achieve LDL <32m92mDL >40mg3m Expected Outcomes  Short Term: Participant states understanding of desired cholesterol values and is compliant with medications prescribed. Participant is following exercise prescription and nutrition guidelines.;Long Term: Cholesterol controlled with medications as prescribed, with individualized exercise RX and with personalized nutrition plan. Value goals: LDL < 32mg,31m > 40 mg.       Core Components/Risk Factors/Patient Goals Review:  Goals and Risk Factor Review    Row Name 04/09/19 1538             Core Components/Risk Factors/Patient Goals Review   Personal Goals Review  Weight  Management/Obesity;Diabetes;Lipids;Hypertension;Heart Failure       Review  Martin Bush iCaydeking his medications  properly and has been to the doctors recently for his diabetes. His A1C went from an 11 to a 10. Martin Bush has no questions about his medications at this time. He is checking his sugar two times a day, one time in the morning and one time at night.       Expected Outcomes  Short: Exercise and take medications as prescribed to help with overall health. Long: maintain exercise and medications independently to improve upon overall health.          Core Components/Risk Factors/Patient Goals at Discharge (Final Review):  Goals and Risk Factor Review - 04/09/19 1538      Core Components/Risk Factors/Patient Goals Review   Personal Goals Review  Weight Management/Obesity;Diabetes;Lipids;Hypertension;Heart Failure    Review  Martin Bush is taking his medications properly and has been to the doctors recently for his diabetes. His A1C went from an 11 to a 10. Martin Bush has no questions about his medications at this time. He is checking his sugar two times a day, one time in the morning and one time at night.    Expected Outcomes  Short: Exercise and take medications as prescribed to help with overall health. Long: maintain exercise and medications independently to improve upon overall health.       Martin Bush Comments: Martin Bush Comments    Row Name 02/28/19 1120 03/06/19 1526 03/08/19 1622 03/21/19 0704 04/18/19 1026   Martin Bush Comments  Completed virtual orientation today.  EP eval scheduled for 2/9 at 2pm. Documentation can be found in [facility] encounter 01/08/19.  Completed 6MWT and gym orientation.  Initial Martin Bush created and sent for review to Dr. Emily Filbert, Medical Director.  First full day of exercise!  Patient was oriented to gym and equipment including functions, settings, policies, and procedures.  Patient's individual exercise prescription and treatment plan were reviewed.  All starting workloads were established based on the  results of the 6 minute walk test done at initial orientation visit.  The plan for exercise progression was also introduced and progression will be customized based on patient's performance and goals.  30 day chart review completed. Martin Bush sent to Dr Zachery Dakins Medical Director, for review,changes as needed and signature.  30 day chart review completed. Martin Bush sent to Dr Zachery Dakins Medical Director, for review,changes as needed and signature.      Comments:

## 2019-04-19 ENCOUNTER — Encounter: Payer: Medicare Other | Admitting: *Deleted

## 2019-04-19 ENCOUNTER — Other Ambulatory Visit: Payer: Self-pay

## 2019-04-19 DIAGNOSIS — Z9861 Coronary angioplasty status: Secondary | ICD-10-CM

## 2019-04-19 DIAGNOSIS — E7800 Pure hypercholesterolemia, unspecified: Secondary | ICD-10-CM | POA: Diagnosis not present

## 2019-04-19 DIAGNOSIS — I5042 Chronic combined systolic (congestive) and diastolic (congestive) heart failure: Secondary | ICD-10-CM | POA: Diagnosis not present

## 2019-04-19 DIAGNOSIS — E118 Type 2 diabetes mellitus with unspecified complications: Secondary | ICD-10-CM | POA: Diagnosis not present

## 2019-04-19 DIAGNOSIS — Z794 Long term (current) use of insulin: Secondary | ICD-10-CM | POA: Diagnosis not present

## 2019-04-19 DIAGNOSIS — I214 Non-ST elevation (NSTEMI) myocardial infarction: Secondary | ICD-10-CM

## 2019-04-19 DIAGNOSIS — Z7901 Long term (current) use of anticoagulants: Secondary | ICD-10-CM | POA: Diagnosis not present

## 2019-04-19 DIAGNOSIS — G4733 Obstructive sleep apnea (adult) (pediatric): Secondary | ICD-10-CM | POA: Diagnosis not present

## 2019-04-19 DIAGNOSIS — I110 Hypertensive heart disease with heart failure: Secondary | ICD-10-CM | POA: Diagnosis not present

## 2019-04-19 DIAGNOSIS — Z87891 Personal history of nicotine dependence: Secondary | ICD-10-CM | POA: Diagnosis not present

## 2019-04-19 DIAGNOSIS — Z7982 Long term (current) use of aspirin: Secondary | ICD-10-CM | POA: Diagnosis not present

## 2019-04-19 DIAGNOSIS — Z79899 Other long term (current) drug therapy: Secondary | ICD-10-CM | POA: Diagnosis not present

## 2019-04-19 LAB — CMP14+EGFR
ALT: 13 IU/L (ref 0–44)
AST: 12 IU/L (ref 0–40)
Albumin/Globulin Ratio: 1.6 (ref 1.2–2.2)
Albumin: 4.1 g/dL (ref 3.8–4.8)
Alkaline Phosphatase: 116 IU/L (ref 39–117)
BUN/Creatinine Ratio: 19 (ref 10–24)
BUN: 31 mg/dL — ABNORMAL HIGH (ref 8–27)
Bilirubin Total: 0.3 mg/dL (ref 0.0–1.2)
CO2: 21 mmol/L (ref 20–29)
Calcium: 9.5 mg/dL (ref 8.6–10.2)
Chloride: 98 mmol/L (ref 96–106)
Creatinine, Ser: 1.65 mg/dL — ABNORMAL HIGH (ref 0.76–1.27)
GFR calc Af Amer: 50 mL/min/{1.73_m2} — ABNORMAL LOW (ref >59)
GFR calc non Af Amer: 44 mL/min/{1.73_m2} — ABNORMAL LOW (ref >59)
Globulin, Total: 2.5 g/dL (ref 1.5–4.5)
Glucose: 473 mg/dL — ABNORMAL HIGH (ref 65–99)
Potassium: 4.7 mmol/L (ref 3.5–5.2)
Sodium: 135 mmol/L (ref 134–144)
Total Protein: 6.6 g/dL (ref 6.0–8.5)

## 2019-04-19 LAB — LIPID PANEL WITH LDL/HDL RATIO
Cholesterol, Total: 127 mg/dL (ref 100–199)
HDL: 39 mg/dL — ABNORMAL LOW (ref >39)
LDL Chol Calc (NIH): 58 mg/dL (ref 0–99)
LDL/HDL Ratio: 1.5 ratio (ref 0.0–3.6)
Triglycerides: 181 mg/dL — ABNORMAL HIGH (ref 0–149)
VLDL Cholesterol Cal: 30 mg/dL (ref 5–40)

## 2019-04-19 LAB — BRAIN NATRIURETIC PEPTIDE: BNP: 820.8 pg/mL — ABNORMAL HIGH (ref 0.0–100.0)

## 2019-04-19 LAB — TSH: TSH: 1.46 u[IU]/mL (ref 0.450–4.500)

## 2019-04-19 LAB — HGB A1C W/O EAG: Hgb A1c MFr Bld: 12.1 % — ABNORMAL HIGH (ref 4.8–5.6)

## 2019-04-19 NOTE — Addendum Note (Signed)
Addended by: Kela Millin on: 04/19/2019 10:47 PM   Modules accepted: Orders

## 2019-04-19 NOTE — Progress Notes (Signed)
Daily Session Note  Patient Details  Name: Martin Bush MRN: [id number] Date of Birth: 03-May-1955 Referring Provider:     Cardiac Rehab from 03/06/2019 in [facility] Cardiac and Pulmonary Rehab  Referring Provider  Adrian Prows MD      Encounter Date: 04/19/2019  Check In: Session Check In - 04/19/19 1533      Check-In   Supervising physician immediately available to respond to emergencies  See telemetry face sheet for immediately available ER MD    Location  [facility]-Cardiac & Pulmonary Rehab    Staff Present  Renita Papa, RN BSN;Joseph [address] [place], [name], RCEP, CCRP, CCET    Virtual Visit  No    Medication changes reported      No    Fall or balance concerns reported     No    Warm-up and Cool-down  Performed on first and last piece of equipment    Resistance Training Performed  Yes    VAD Patient?  No    PAD/SET Patient?  No      Pain Assessment   Currently in Pain?  No/denies          Social History   Tobacco Use  Smoking Status Former Smoker  . Packs/day: 0.25  . Years: 27.00  . Pack years: 6.75  . Types: Cigars  . Quit date: 2002  . Years since quitting: 19.2  Smokeless Tobacco Former Systems developer  . Quit date: 2002    Goals Met:  Independence with exercise equipment Exercise tolerated well No report of cardiac concerns or symptoms Strength training completed today  Goals Unmet:  Not Applicable  Comments: Pt able to follow exercise prescription today without complaint.  Will continue to monitor for progression.    Dr. Emily Filbert is Medical Director for [place] and [facility].

## 2019-04-21 ENCOUNTER — Telehealth: Payer: Self-pay | Admitting: Family Medicine

## 2019-04-21 NOTE — Telephone Encounter (Signed)
Please let the patient know that his A1c is more uncontrolled than previously. Please fax these results to [facility] endocrinology so they can review and make adjustments as needed. Thanks.

## 2019-04-23 ENCOUNTER — Other Ambulatory Visit: Payer: Self-pay

## 2019-04-23 ENCOUNTER — Encounter: Payer: Medicare Other | Admitting: *Deleted

## 2019-04-23 DIAGNOSIS — E7800 Pure hypercholesterolemia, unspecified: Secondary | ICD-10-CM | POA: Diagnosis not present

## 2019-04-23 DIAGNOSIS — Z7901 Long term (current) use of anticoagulants: Secondary | ICD-10-CM | POA: Diagnosis not present

## 2019-04-23 DIAGNOSIS — Z79899 Other long term (current) drug therapy: Secondary | ICD-10-CM | POA: Diagnosis not present

## 2019-04-23 DIAGNOSIS — Z9861 Coronary angioplasty status: Secondary | ICD-10-CM | POA: Diagnosis not present

## 2019-04-23 DIAGNOSIS — I214 Non-ST elevation (NSTEMI) myocardial infarction: Secondary | ICD-10-CM | POA: Diagnosis not present

## 2019-04-23 DIAGNOSIS — G4733 Obstructive sleep apnea (adult) (pediatric): Secondary | ICD-10-CM | POA: Diagnosis not present

## 2019-04-23 DIAGNOSIS — E118 Type 2 diabetes mellitus with unspecified complications: Secondary | ICD-10-CM | POA: Diagnosis not present

## 2019-04-23 DIAGNOSIS — Z87891 Personal history of nicotine dependence: Secondary | ICD-10-CM | POA: Diagnosis not present

## 2019-04-23 DIAGNOSIS — I110 Hypertensive heart disease with heart failure: Secondary | ICD-10-CM | POA: Diagnosis not present

## 2019-04-23 DIAGNOSIS — Z7982 Long term (current) use of aspirin: Secondary | ICD-10-CM | POA: Diagnosis not present

## 2019-04-23 DIAGNOSIS — Z794 Long term (current) use of insulin: Secondary | ICD-10-CM | POA: Diagnosis not present

## 2019-04-23 DIAGNOSIS — I5042 Chronic combined systolic (congestive) and diastolic (congestive) heart failure: Secondary | ICD-10-CM | POA: Diagnosis not present

## 2019-04-23 NOTE — Progress Notes (Signed)
Daily Session Note  Patient Details  Name: Martin Bush MRN: [id number] Date of Birth: April 09, 1955 Referring Provider:     Cardiac Rehab from 03/06/2019 in [facility] Cardiac and Pulmonary Rehab  Referring Provider  Adrian Prows MD      Encounter Date: 04/23/2019  Check In: Session Check In - 04/23/19 1524      Check-In   Supervising physician immediately available to respond to emergencies  See telemetry face sheet for immediately available ER MD    Location  [facility]-Cardiac & Pulmonary Rehab    Staff Present  Renita Papa, RN Moises Blood, BS, ACSM CEP, Exercise Physiologist;Joseph Darrin Nipper, [name], RCEP, CCRP, CCET    Virtual Visit  No    Medication changes reported      No    Fall or balance concerns reported     No    Warm-up and Cool-down  Performed on first and last piece of equipment    Resistance Training Performed  Yes    VAD Patient?  No    PAD/SET Patient?  No      Pain Assessment   Currently in Pain?  No/denies          Social History   Tobacco Use  Smoking Status Former Smoker  . Packs/day: 0.25  . Years: 27.00  . Pack years: 6.75  . Types: Cigars  . Quit date: 2002  . Years since quitting: 19.2  Smokeless Tobacco Former Systems developer  . Quit date: 2002    Goals Met:  Independence with exercise equipment Exercise tolerated well No report of cardiac concerns or symptoms Strength training completed today  Goals Unmet:  Not Applicable  Comments: Pt able to follow exercise prescription today without complaint.  Will continue to monitor for progression.    Dr. Emily Filbert is Medical Director for [place] and [facility].

## 2019-04-23 NOTE — Telephone Encounter (Signed)
I called and informed the patient of his lab results and I faxed the results to Solum, Betsey Holiday, MD at [facility] endocrinology.  Caliana Spires,cma

## 2019-04-24 DIAGNOSIS — Z4502 Encounter for adjustment and management of automatic implantable cardiac defibrillator: Secondary | ICD-10-CM | POA: Diagnosis not present

## 2019-04-24 DIAGNOSIS — I5042 Chronic combined systolic (congestive) and diastolic (congestive) heart failure: Secondary | ICD-10-CM | POA: Diagnosis not present

## 2019-04-24 DIAGNOSIS — Z95810 Presence of automatic (implantable) cardiac defibrillator: Secondary | ICD-10-CM | POA: Diagnosis not present

## 2019-04-25 ENCOUNTER — Encounter: Payer: Medicare Other | Admitting: *Deleted

## 2019-04-25 ENCOUNTER — Other Ambulatory Visit: Payer: Self-pay

## 2019-04-25 DIAGNOSIS — G4733 Obstructive sleep apnea (adult) (pediatric): Secondary | ICD-10-CM | POA: Diagnosis not present

## 2019-04-25 DIAGNOSIS — I5022 Chronic systolic (congestive) heart failure: Secondary | ICD-10-CM | POA: Diagnosis not present

## 2019-04-25 DIAGNOSIS — Z7901 Long term (current) use of anticoagulants: Secondary | ICD-10-CM | POA: Diagnosis not present

## 2019-04-25 DIAGNOSIS — Z87891 Personal history of nicotine dependence: Secondary | ICD-10-CM | POA: Diagnosis not present

## 2019-04-25 DIAGNOSIS — E7800 Pure hypercholesterolemia, unspecified: Secondary | ICD-10-CM | POA: Diagnosis not present

## 2019-04-25 DIAGNOSIS — Z7982 Long term (current) use of aspirin: Secondary | ICD-10-CM | POA: Diagnosis not present

## 2019-04-25 DIAGNOSIS — I214 Non-ST elevation (NSTEMI) myocardial infarction: Secondary | ICD-10-CM

## 2019-04-25 DIAGNOSIS — Z9861 Coronary angioplasty status: Secondary | ICD-10-CM | POA: Diagnosis not present

## 2019-04-25 DIAGNOSIS — Z79899 Other long term (current) drug therapy: Secondary | ICD-10-CM | POA: Diagnosis not present

## 2019-04-25 DIAGNOSIS — I110 Hypertensive heart disease with heart failure: Secondary | ICD-10-CM | POA: Diagnosis not present

## 2019-04-25 DIAGNOSIS — I5042 Chronic combined systolic (congestive) and diastolic (congestive) heart failure: Secondary | ICD-10-CM | POA: Diagnosis not present

## 2019-04-25 DIAGNOSIS — E118 Type 2 diabetes mellitus with unspecified complications: Secondary | ICD-10-CM | POA: Diagnosis not present

## 2019-04-25 DIAGNOSIS — Z794 Long term (current) use of insulin: Secondary | ICD-10-CM | POA: Diagnosis not present

## 2019-04-25 NOTE — Progress Notes (Signed)
Daily Session Note  Patient Details  Name: Martin Bush MRN: [id number] Date of Birth: 1955-01-27 Referring Provider:     Cardiac Rehab from 03/06/2019 in [facility] Cardiac and Pulmonary Rehab  Referring Provider  Adrian Prows MD      Encounter Date: 04/25/2019  Check In: Session Check In - 04/25/19 1718      Check-In   Supervising physician immediately available to respond to emergencies  See telemetry face sheet for immediately available ER MD    Staff Present  Nyoka Cowden, RN, BSN, MA;Meredith Sherryll Burger, RN BSN;[name] Caiola RDN, LDN    Virtual Visit  No    Medication changes reported      No    Fall or balance concerns reported     No    Warm-up and Cool-down  Performed on first and last piece of equipment    VAD Patient?  No    PAD/SET Patient?  No      Pain Assessment   Currently in Pain?  No/denies          Social History   Tobacco Use  Smoking Status Former Smoker  . Packs/day: 0.25  . Years: 27.00  . Pack years: 6.75  . Types: Cigars  . Quit date: 2002  . Years since quitting: 19.2  Smokeless Tobacco Former Systems developer  . Quit date: 2002    Goals Met:  Independence with exercise equipment Personal goals reviewed No report of cardiac concerns or symptoms  Goals Unmet:  Not Applicable  Comments: Pt able to follow exercise prescription today without complaint.  Will continue to monitor for progression.   Dr. Emily Filbert is Medical Director for [place] and [facility].

## 2019-05-02 ENCOUNTER — Other Ambulatory Visit: Payer: Self-pay

## 2019-05-02 ENCOUNTER — Encounter: Payer: Medicare Other | Attending: Cardiology | Admitting: *Deleted

## 2019-05-02 DIAGNOSIS — F439 Reaction to severe stress, unspecified: Secondary | ICD-10-CM | POA: Insufficient documentation

## 2019-05-02 DIAGNOSIS — Z7982 Long term (current) use of aspirin: Secondary | ICD-10-CM | POA: Diagnosis not present

## 2019-05-02 DIAGNOSIS — I214 Non-ST elevation (NSTEMI) myocardial infarction: Secondary | ICD-10-CM | POA: Insufficient documentation

## 2019-05-02 DIAGNOSIS — G4733 Obstructive sleep apnea (adult) (pediatric): Secondary | ICD-10-CM | POA: Diagnosis not present

## 2019-05-02 DIAGNOSIS — I5042 Chronic combined systolic (congestive) and diastolic (congestive) heart failure: Secondary | ICD-10-CM | POA: Insufficient documentation

## 2019-05-02 DIAGNOSIS — F329 Major depressive disorder, single episode, unspecified: Secondary | ICD-10-CM | POA: Insufficient documentation

## 2019-05-02 DIAGNOSIS — Z87891 Personal history of nicotine dependence: Secondary | ICD-10-CM | POA: Diagnosis not present

## 2019-05-02 DIAGNOSIS — E7800 Pure hypercholesterolemia, unspecified: Secondary | ICD-10-CM | POA: Diagnosis not present

## 2019-05-02 DIAGNOSIS — F419 Anxiety disorder, unspecified: Secondary | ICD-10-CM | POA: Insufficient documentation

## 2019-05-02 DIAGNOSIS — Z7901 Long term (current) use of anticoagulants: Secondary | ICD-10-CM | POA: Insufficient documentation

## 2019-05-02 DIAGNOSIS — Z794 Long term (current) use of insulin: Secondary | ICD-10-CM | POA: Insufficient documentation

## 2019-05-02 DIAGNOSIS — Z79899 Other long term (current) drug therapy: Secondary | ICD-10-CM | POA: Diagnosis not present

## 2019-05-02 DIAGNOSIS — Z9861 Coronary angioplasty status: Secondary | ICD-10-CM | POA: Diagnosis not present

## 2019-05-02 DIAGNOSIS — I110 Hypertensive heart disease with heart failure: Secondary | ICD-10-CM | POA: Diagnosis not present

## 2019-05-02 DIAGNOSIS — E118 Type 2 diabetes mellitus with unspecified complications: Secondary | ICD-10-CM | POA: Insufficient documentation

## 2019-05-02 NOTE — Progress Notes (Signed)
Daily Session Note  Patient Details  Name: Martin Bush MRN: [id number] Date of Birth: 08-Feb-1955 Referring Provider:     Cardiac Rehab from 03/06/2019 in [facility] Cardiac and Pulmonary Rehab  Referring Provider  Adrian Prows MD      Encounter Date: 05/02/2019  Check In: Session Check In - 05/02/19 1304      Check-In   Supervising physician immediately available to respond to emergencies  See telemetry face sheet for immediately available ER MD    Location  [facility]-Cardiac & Pulmonary Rehab    Staff Present  Renita Papa, RN BSN;Joseph Hood RCP,RRT,BSRT;Amanda Oletta Darter, [place], ACSM CEP, Exercise Physiologist    Virtual Visit  No    Medication changes reported      No    Fall or balance concerns reported     No    Warm-up and Cool-down  Performed on first and last piece of equipment    Resistance Training Performed  Yes    VAD Patient?  No    PAD/SET Patient?  No      Pain Assessment   Currently in Pain?  No/denies          Social History   Tobacco Use  Smoking Status Former Smoker  . Packs/day: 0.25  . Years: 27.00  . Pack years: 6.75  . Types: Cigars  . Quit date: 2002  . Years since quitting: 19.2  Smokeless Tobacco Former Systems developer  . Quit date: 2002    Goals Met:  Independence with exercise equipment Exercise tolerated well No report of cardiac concerns or symptoms Strength training completed today  Goals Unmet:  Not Applicable  Comments: Pt able to follow exercise prescription today without complaint.  Will continue to monitor for progression.    Dr. Emily Filbert is Medical Director for [place] and [facility].

## 2019-05-03 ENCOUNTER — Other Ambulatory Visit: Payer: Self-pay

## 2019-05-03 ENCOUNTER — Encounter: Payer: Medicare Other | Admitting: *Deleted

## 2019-05-03 DIAGNOSIS — E7800 Pure hypercholesterolemia, unspecified: Secondary | ICD-10-CM | POA: Diagnosis not present

## 2019-05-03 DIAGNOSIS — G4733 Obstructive sleep apnea (adult) (pediatric): Secondary | ICD-10-CM | POA: Diagnosis not present

## 2019-05-03 DIAGNOSIS — I214 Non-ST elevation (NSTEMI) myocardial infarction: Secondary | ICD-10-CM

## 2019-05-03 DIAGNOSIS — Z794 Long term (current) use of insulin: Secondary | ICD-10-CM | POA: Diagnosis not present

## 2019-05-03 DIAGNOSIS — I5042 Chronic combined systolic (congestive) and diastolic (congestive) heart failure: Secondary | ICD-10-CM | POA: Diagnosis not present

## 2019-05-03 DIAGNOSIS — Z79899 Other long term (current) drug therapy: Secondary | ICD-10-CM | POA: Diagnosis not present

## 2019-05-03 DIAGNOSIS — Z7982 Long term (current) use of aspirin: Secondary | ICD-10-CM | POA: Diagnosis not present

## 2019-05-03 DIAGNOSIS — E118 Type 2 diabetes mellitus with unspecified complications: Secondary | ICD-10-CM | POA: Diagnosis not present

## 2019-05-03 DIAGNOSIS — Z7901 Long term (current) use of anticoagulants: Secondary | ICD-10-CM | POA: Diagnosis not present

## 2019-05-03 DIAGNOSIS — Z87891 Personal history of nicotine dependence: Secondary | ICD-10-CM | POA: Diagnosis not present

## 2019-05-03 DIAGNOSIS — I110 Hypertensive heart disease with heart failure: Secondary | ICD-10-CM | POA: Diagnosis not present

## 2019-05-03 DIAGNOSIS — Z9861 Coronary angioplasty status: Secondary | ICD-10-CM | POA: Diagnosis not present

## 2019-05-03 NOTE — Progress Notes (Signed)
Daily Session Note  Patient Details  Name: Martin Bush MRN: [id number] Date of Birth: 05/26/1955 Referring Provider:     Cardiac Rehab from 03/06/2019 in [facility] Cardiac and Pulmonary Rehab  Referring Provider  Ganji, Jay MD      Encounter Date: 05/03/2019  Check In: Session Check In - 05/03/19 1315      Check-In   Supervising physician immediately available to respond to emergencies  See telemetry face sheet for immediately available ER MD    Location  [facility]-Cardiac & Pulmonary Rehab    Staff Present  Mary Jo Abernethy, RN, BSN, MA;Meredith Craven, RN BSN;Joseph Hood RCP,RRT,BSRT    Virtual Visit  No    Medication changes reported      No    Fall or balance concerns reported     No    Warm-up and Cool-down  Performed on first and last piece of equipment    Resistance Training Performed  Yes    VAD Patient?  No    PAD/SET Patient?  No      Pain Assessment   Currently in Pain?  No/denies          Social History   Tobacco Use  Smoking Status Former Smoker  . Packs/day: 0.25  . Years: 27.00  . Pack years: 6.75  . Types: Cigars  . Quit date: 2002  . Years since quitting: 19.2  Smokeless Tobacco Former User  . Quit date: 2002    Goals Met:  Independence with exercise equipment Exercise tolerated well No report of cardiac concerns or symptoms  Goals Unmet:  Not Applicable  Comments: Pt able to follow exercise prescription today without complaint.  Will continue to monitor for progression.   Dr. Mark Miller is Medical Director for [facility] and [facility]. 

## 2019-05-07 ENCOUNTER — Encounter: Payer: Medicare Other | Admitting: *Deleted

## 2019-05-07 ENCOUNTER — Other Ambulatory Visit: Payer: Self-pay

## 2019-05-07 DIAGNOSIS — Z7982 Long term (current) use of aspirin: Secondary | ICD-10-CM | POA: Diagnosis not present

## 2019-05-07 DIAGNOSIS — I214 Non-ST elevation (NSTEMI) myocardial infarction: Secondary | ICD-10-CM

## 2019-05-07 DIAGNOSIS — Z9861 Coronary angioplasty status: Secondary | ICD-10-CM | POA: Diagnosis not present

## 2019-05-07 DIAGNOSIS — I5042 Chronic combined systolic (congestive) and diastolic (congestive) heart failure: Secondary | ICD-10-CM | POA: Diagnosis not present

## 2019-05-07 DIAGNOSIS — E7800 Pure hypercholesterolemia, unspecified: Secondary | ICD-10-CM | POA: Diagnosis not present

## 2019-05-07 DIAGNOSIS — Z7901 Long term (current) use of anticoagulants: Secondary | ICD-10-CM | POA: Diagnosis not present

## 2019-05-07 DIAGNOSIS — E118 Type 2 diabetes mellitus with unspecified complications: Secondary | ICD-10-CM | POA: Diagnosis not present

## 2019-05-07 DIAGNOSIS — I110 Hypertensive heart disease with heart failure: Secondary | ICD-10-CM | POA: Diagnosis not present

## 2019-05-07 DIAGNOSIS — G4733 Obstructive sleep apnea (adult) (pediatric): Secondary | ICD-10-CM | POA: Diagnosis not present

## 2019-05-07 DIAGNOSIS — Z87891 Personal history of nicotine dependence: Secondary | ICD-10-CM | POA: Diagnosis not present

## 2019-05-07 DIAGNOSIS — Z794 Long term (current) use of insulin: Secondary | ICD-10-CM | POA: Diagnosis not present

## 2019-05-07 DIAGNOSIS — Z79899 Other long term (current) drug therapy: Secondary | ICD-10-CM | POA: Diagnosis not present

## 2019-05-07 NOTE — Progress Notes (Signed)
Daily Session Note  Patient Details  Name: Martin Bush MRN: [id number] Date of Birth: [birth year] Referring Provider:     Cardiac Rehab from 03/06/2019 in [facility] Cardiac and Pulmonary Rehab  Referring Provider  Adrian Prows MD      Encounter Date: 05/07/2019  Check In: Session Check In - 05/07/19 1316      Check-In   Supervising physician immediately available to respond to emergencies  See telemetry face sheet for immediately available ER MD    Location  [facility]-Cardiac & Pulmonary Rehab    Staff Present  Heath Lark, RN, BSN, CCRP;Meredith Sherryll Burger, RN Moises Blood, BS, ACSM CEP, Exercise Physiologist;Joseph [address] [place], [name], RCEP, CCRP, CCET;[name] [place] RDN, LDN    Virtual Visit  No    Medication changes reported      No    Fall or balance concerns reported     No    Warm-up and Cool-down  Performed on first and last piece of equipment    Resistance Training Performed  Yes    VAD Patient?  No    PAD/SET Patient?  No      Pain Assessment   Currently in Pain?  No/denies          Social History   Tobacco Use  Smoking Status Former Smoker  . Packs/day: 0.25  . Years: 27.00  . Pack years: 6.75  . Types: Cigars  . Quit date: 2002  . Years since quitting: 19.2  Smokeless Tobacco Former Systems developer  . Quit date: 2002    Goals Met:  Independence with exercise equipment Exercise tolerated well No report of cardiac concerns or symptoms  Goals Unmet:  Not Applicable  Comments: Pt able to follow exercise prescription today without complaint.  Will continue to monitor for progression.    Dr. Emily Filbert is Medical Director for [place] and [facility].

## 2019-05-08 DIAGNOSIS — I5043 Acute on chronic combined systolic (congestive) and diastolic (congestive) heart failure: Secondary | ICD-10-CM | POA: Diagnosis not present

## 2019-05-09 ENCOUNTER — Encounter: Payer: Medicare Other | Admitting: *Deleted

## 2019-05-09 ENCOUNTER — Other Ambulatory Visit: Payer: Self-pay

## 2019-05-09 DIAGNOSIS — G4733 Obstructive sleep apnea (adult) (pediatric): Secondary | ICD-10-CM | POA: Diagnosis not present

## 2019-05-09 DIAGNOSIS — I110 Hypertensive heart disease with heart failure: Secondary | ICD-10-CM | POA: Diagnosis not present

## 2019-05-09 DIAGNOSIS — I214 Non-ST elevation (NSTEMI) myocardial infarction: Secondary | ICD-10-CM

## 2019-05-09 DIAGNOSIS — Z9861 Coronary angioplasty status: Secondary | ICD-10-CM | POA: Diagnosis not present

## 2019-05-09 DIAGNOSIS — E118 Type 2 diabetes mellitus with unspecified complications: Secondary | ICD-10-CM | POA: Diagnosis not present

## 2019-05-09 DIAGNOSIS — Z794 Long term (current) use of insulin: Secondary | ICD-10-CM | POA: Diagnosis not present

## 2019-05-09 DIAGNOSIS — Z87891 Personal history of nicotine dependence: Secondary | ICD-10-CM | POA: Diagnosis not present

## 2019-05-09 DIAGNOSIS — I5042 Chronic combined systolic (congestive) and diastolic (congestive) heart failure: Secondary | ICD-10-CM | POA: Diagnosis not present

## 2019-05-09 DIAGNOSIS — Z7901 Long term (current) use of anticoagulants: Secondary | ICD-10-CM | POA: Diagnosis not present

## 2019-05-09 DIAGNOSIS — Z79899 Other long term (current) drug therapy: Secondary | ICD-10-CM | POA: Diagnosis not present

## 2019-05-09 DIAGNOSIS — Z7982 Long term (current) use of aspirin: Secondary | ICD-10-CM | POA: Diagnosis not present

## 2019-05-09 DIAGNOSIS — E7800 Pure hypercholesterolemia, unspecified: Secondary | ICD-10-CM | POA: Diagnosis not present

## 2019-05-09 NOTE — Progress Notes (Signed)
Daily Session Note  Patient Details  Name: Martin Bush MRN: [id number] Date of Birth: 15-Sep-1955 Referring Provider:     Cardiac Rehab from 03/06/2019 in [facility] Cardiac and Pulmonary Rehab  Referring Provider  Adrian Prows MD      Encounter Date: 05/09/2019  Check In: Session Check In - 05/09/19 1318      Check-In   Supervising physician immediately available to respond to emergencies  See telemetry face sheet for immediately available ER MD    Location  [facility]-Cardiac & Pulmonary Rehab    Staff Present  Heath Lark, RN, BSN, CCRP;Meredith Sherryll Burger, RN BSN;[name] Caiola RDN, LDN;Joseph Hood RCP,RRT,BSRT    Virtual Visit  No    Medication changes reported      No    Fall or balance concerns reported     No    Warm-up and Cool-down  Performed on first and last piece of equipment    Resistance Training Performed  Yes    VAD Patient?  No    PAD/SET Patient?  No      Pain Assessment   Currently in Pain?  No/denies          Social History   Tobacco Use  Smoking Status Former Smoker  . Packs/day: 0.25  . Years: 27.00  . Pack years: 6.75  . Types: Cigars  . Quit date: 2002  . Years since quitting: 19.2  Smokeless Tobacco Former Systems developer  . Quit date: 2002    Goals Met:  Independence with exercise equipment Exercise tolerated well No report of cardiac concerns or symptoms  Goals Unmet:  Not Applicable  Comments: Pt able to follow exercise prescription today without complaint.  Will continue to monitor for progression.    Dr. Emily Filbert is Medical Director for [facility].

## 2019-05-10 ENCOUNTER — Other Ambulatory Visit: Payer: Self-pay | Admitting: Cardiology

## 2019-05-14 ENCOUNTER — Other Ambulatory Visit: Payer: Self-pay

## 2019-05-14 ENCOUNTER — Encounter: Payer: Medicare Other | Admitting: *Deleted

## 2019-05-14 DIAGNOSIS — Z87891 Personal history of nicotine dependence: Secondary | ICD-10-CM | POA: Diagnosis not present

## 2019-05-14 DIAGNOSIS — Z7982 Long term (current) use of aspirin: Secondary | ICD-10-CM | POA: Diagnosis not present

## 2019-05-14 DIAGNOSIS — G4733 Obstructive sleep apnea (adult) (pediatric): Secondary | ICD-10-CM | POA: Diagnosis not present

## 2019-05-14 DIAGNOSIS — Z79899 Other long term (current) drug therapy: Secondary | ICD-10-CM | POA: Diagnosis not present

## 2019-05-14 DIAGNOSIS — I214 Non-ST elevation (NSTEMI) myocardial infarction: Secondary | ICD-10-CM | POA: Diagnosis not present

## 2019-05-14 DIAGNOSIS — I5042 Chronic combined systolic (congestive) and diastolic (congestive) heart failure: Secondary | ICD-10-CM | POA: Diagnosis not present

## 2019-05-14 DIAGNOSIS — Z794 Long term (current) use of insulin: Secondary | ICD-10-CM | POA: Diagnosis not present

## 2019-05-14 DIAGNOSIS — I110 Hypertensive heart disease with heart failure: Secondary | ICD-10-CM | POA: Diagnosis not present

## 2019-05-14 DIAGNOSIS — Z9861 Coronary angioplasty status: Secondary | ICD-10-CM

## 2019-05-14 DIAGNOSIS — Z7901 Long term (current) use of anticoagulants: Secondary | ICD-10-CM | POA: Diagnosis not present

## 2019-05-14 DIAGNOSIS — E118 Type 2 diabetes mellitus with unspecified complications: Secondary | ICD-10-CM | POA: Diagnosis not present

## 2019-05-14 DIAGNOSIS — E7800 Pure hypercholesterolemia, unspecified: Secondary | ICD-10-CM | POA: Diagnosis not present

## 2019-05-14 NOTE — Progress Notes (Signed)
Daily Session Note  Patient Details  Name: Martin Bush MRN: [id number] Date of Birth: 1955/11/08 Referring Provider:     Cardiac Rehab from 03/06/2019 in [facility] Cardiac and Pulmonary Rehab  Referring Provider  Adrian Prows MD      Encounter Date: 05/14/2019  Check In: Session Check In - 05/14/19 1318      Check-In   Supervising physician immediately available to respond to emergencies  See telemetry face sheet for immediately available ER MD    Location  [facility]-Cardiac & Pulmonary Rehab    Staff Present  Heath Lark, RN, BSN, CCRP;[name] Caiola RDN, Wilhelmina Mcardle, BS, ACSM CEP, Exercise Physiologist;Joseph Tessie Fass RCP,RRT,BSRT    Virtual Visit  No    Medication changes reported      No    Fall or balance concerns reported     No    Warm-up and Cool-down  Performed on first and last piece of equipment    Resistance Training Performed  Yes    VAD Patient?  No    PAD/SET Patient?  No      Pain Assessment   Currently in Pain?  No/denies          Social History   Tobacco Use  Smoking Status Former Smoker  . Packs/day: 0.25  . Years: 27.00  . Pack years: 6.75  . Types: Cigars  . Quit date: 2002  . Years since quitting: 19.3  Smokeless Tobacco Former Systems developer  . Quit date: 2002    Goals Met:  Independence with exercise equipment Exercise tolerated well No report of cardiac concerns or symptoms  Goals Unmet:  Not Applicable  Comments: Pt able to follow exercise prescription today without complaint.  Will continue to monitor for progression.    Dr. Emily Filbert is Medical Director for [facility].

## 2019-05-15 ENCOUNTER — Encounter: Payer: Self-pay | Admitting: *Deleted

## 2019-05-15 ENCOUNTER — Other Ambulatory Visit: Payer: Self-pay | Admitting: *Deleted

## 2019-05-15 NOTE — Patient Outreach (Signed)
[facility]) Care Management [place] Telephone Outreach Post-hospital discharge day # 124 without unplanned hospital re-admission Unsuccessful (consecutive) outreach attempt # 1- previously engaged patient  05/15/2019  Finbar Malec [birth year] [id number]  Unsuccessful telephone outreach to [name], 64 y/o male referred 01/12/2019 to [place] by [facility] Liaison after recent hospitalization December 9- 17, 2020 for acute pulmonary edema/ NSTEMI with native vessel disease and stent re-stenosis; patient had cardiac catherization 01/08/2019 and eventual PCI/ angioplasty.Patient was discharged home to self care withouthome health services in place. 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  Male person answering phone stated that patient is not available, "we are in the middle of something important;" she requests call back at another time, and I agreed to re-attempt outreach to patient later this week.  Plan:  Will re-attempt [place] telephone outreach within 4 business days if I do not hear back from patient first.  Oneta Rack, RN, BSN, [place] Coordinator [facility] Care Management  [phone]

## 2019-05-16 ENCOUNTER — Other Ambulatory Visit: Payer: Self-pay

## 2019-05-16 ENCOUNTER — Encounter: Payer: Self-pay | Admitting: *Deleted

## 2019-05-16 ENCOUNTER — Telehealth: Payer: Self-pay | Admitting: Pharmacist

## 2019-05-16 ENCOUNTER — Encounter: Payer: Medicare Other | Admitting: *Deleted

## 2019-05-16 DIAGNOSIS — I214 Non-ST elevation (NSTEMI) myocardial infarction: Secondary | ICD-10-CM | POA: Diagnosis not present

## 2019-05-16 DIAGNOSIS — E7800 Pure hypercholesterolemia, unspecified: Secondary | ICD-10-CM | POA: Diagnosis not present

## 2019-05-16 DIAGNOSIS — Z9861 Coronary angioplasty status: Secondary | ICD-10-CM | POA: Diagnosis not present

## 2019-05-16 DIAGNOSIS — E118 Type 2 diabetes mellitus with unspecified complications: Secondary | ICD-10-CM | POA: Diagnosis not present

## 2019-05-16 DIAGNOSIS — I110 Hypertensive heart disease with heart failure: Secondary | ICD-10-CM | POA: Diagnosis not present

## 2019-05-16 DIAGNOSIS — Z794 Long term (current) use of insulin: Secondary | ICD-10-CM | POA: Diagnosis not present

## 2019-05-16 DIAGNOSIS — Z79899 Other long term (current) drug therapy: Secondary | ICD-10-CM | POA: Diagnosis not present

## 2019-05-16 DIAGNOSIS — Z7982 Long term (current) use of aspirin: Secondary | ICD-10-CM | POA: Diagnosis not present

## 2019-05-16 DIAGNOSIS — Z7901 Long term (current) use of anticoagulants: Secondary | ICD-10-CM | POA: Diagnosis not present

## 2019-05-16 DIAGNOSIS — G4733 Obstructive sleep apnea (adult) (pediatric): Secondary | ICD-10-CM | POA: Diagnosis not present

## 2019-05-16 DIAGNOSIS — I5042 Chronic combined systolic (congestive) and diastolic (congestive) heart failure: Secondary | ICD-10-CM | POA: Diagnosis not present

## 2019-05-16 DIAGNOSIS — Z87891 Personal history of nicotine dependence: Secondary | ICD-10-CM | POA: Diagnosis not present

## 2019-05-16 NOTE — Progress Notes (Signed)
Daily Session Note  Patient Details  Name: Martin Bush MRN: [id number] Date of Birth: [birth year] Referring Provider:     Cardiac Rehab from 03/06/2019 in [facility] Cardiac and Pulmonary Rehab  Referring Provider  Adrian Prows MD      Encounter Date: 05/16/2019  Check In: Session Check In - 05/16/19 1320      Check-In   Supervising physician immediately available to respond to emergencies  See telemetry face sheet for immediately available ER MD    Location  [facility]-Cardiac & Pulmonary Rehab    Staff Present  Heath Lark, RN, BSN, CCRP;[name] [place] RDN, LDN;Joseph Hood [place], [name], [place], [place], [facility]    Virtual Visit  No    Medication changes reported      No    Fall or balance concerns reported     No    Warm-up and Cool-down  Performed on first and last piece of equipment    Resistance Training Performed  Yes    VAD Patient?  No    PAD/SET Patient?  No      Pain Assessment   Currently in Pain?  No/denies          Social History   Tobacco Use  Smoking Status Former Smoker  . Packs/day: 0.25  . Years: 27.00  . Pack years: 6.75  . Types: Cigars  . Quit date: 2002  . Years since quitting: 19.3  Smokeless Tobacco Former Systems developer  . Quit date: 2002    Goals Met:  Independence with exercise equipment Exercise tolerated well No report of cardiac concerns or symptoms  Goals Unmet:  Not Applicable  Comments: Pt able to follow exercise prescription today without complaint.  Will continue to monitor for progression.    Dr. Emily Filbert is Medical Director for [place] [facility].

## 2019-05-16 NOTE — Progress Notes (Signed)
Cardiac Individual Treatment Plan  Patient Details  Name: Martin Bush MRN: [id number] Date of Birth: 1955-06-23 Referring Provider:     Cardiac Rehab from 03/06/2019 in [facility] Cardiac and Pulmonary Rehab  Referring Provider  Adrian Prows MD      Initial Encounter Date:    Cardiac Rehab from 03/06/2019 in [facility] Cardiac and Pulmonary Rehab  Date  03/06/19      Visit Diagnosis: NSTEMI (non-ST elevated myocardial infarction) ([place])  S/P PTCA (percutaneous transluminal coronary angioplasty)  Patient's Home Medications on Admission:  Current Outpatient Medications:  .  acetaminophen (TYLENOL) 325 MG tablet, Take 2 tablets (650 mg total) by mouth every 4 (four) hours as needed for headache or mild pain., Disp:  , Rfl:  .  amiodarone (PACERONE) 400 MG tablet, Take 0.5 tablets (200 mg total) by mouth daily. (Patient taking differently: Take 400 mg by mouth daily. 1/2 tablet in the morning, 1/2 tablet in the evening), Disp:  , Rfl:  .  aspirin EC 81 MG EC tablet, Take 1 tablet (81 mg total) by mouth daily., Disp:  , Rfl:  .  carvedilol (COREG) 6.25 MG tablet, Take 1 tablet (6.25 mg total) by mouth 2 (two) times daily with a meal., Disp:  , Rfl:  .  Cholecalciferol (VITAMIN D3) 50 MCG (2000 UT) TABS, Take by mouth 2 (two) times daily., Disp: , Rfl:  .  empagliflozin (JARDIANCE) 25 MG TABS tablet, Take 25 mg by mouth daily., Disp: , Rfl:  .  ezetimibe (ZETIA) 10 MG tablet, Take 1 tablet (10 mg total) by mouth at bedtime., Disp:  , Rfl:  .  furosemide (LASIX) 40 MG tablet, Take 40 mg by mouth daily., Disp: , Rfl:  .  insulin aspart protamine- aspart (NOVOLOG MIX 70/30) (70-30) 100 UNIT/ML injection, Inject 0.25 mLs (25 Units total) into the skin 2 (two) times daily with a meal. (Patient taking differently: Inject 63 Units into the skin 2 (two) times daily with a meal. ), Disp: 10 mL, Rfl: 11 .  isosorbide mononitrate (IMDUR) 30 MG 24 hr tablet, Take 1 tablet (30 mg total) by mouth daily., Disp:  , Rfl:   .  liraglutide (VICTOZA) 18 MG/3ML SOPN, Inject 1.8 mg into the skin daily., Disp: , Rfl:  .  metFORMIN (GLUCOPHAGE) 500 MG tablet, Take 500 mg by mouth 2 (two) times daily with a meal. Patient reports prescribed  4 times per day with meals- this is how patient report taking, Disp: , Rfl:  .  nitroGLYCERIN (NITROSTAT) 0.4 MG SL tablet, Place 1 tablet (0.4 mg total) under the tongue every 5 (five) minutes x 3 doses as needed for chest pain., Disp:  , Rfl: 12 .  Omega-3 Fatty Acids (FISH OIL) 1000 MG CAPS, Take 1,000 mg by mouth 2 (two) times daily. , Disp: , Rfl:  .  potassium chloride SA (KLOR-CON) 20 MEQ tablet, TAKE 1 TABLET BY MOUTH TWICE A DAY, Disp: 60 tablet, Rfl: 1 .  PRESCRIPTION MEDICATION, Inhale into the lungs at bedtime. CPAP, Disp: , Rfl:  .  rosuvastatin (CRESTOR) 40 MG tablet, Take 1 tablet (40 mg total) by mouth at bedtime., Disp:  , Rfl:  .  sacubitril-valsartan (ENTRESTO) 97-103 MG, Take 1 tablet by mouth 2 (two) times daily., Disp: , Rfl:  .  sertraline (ZOLOFT) 50 MG tablet, Take 1 tablet (50 mg total) by mouth at bedtime., Disp:  , Rfl:  .  ticagrelor (BRILINTA) 90 MG TABS tablet, Take 1 tablet (90 mg total) by  mouth 2 (two) times daily., Disp: 60 tablet, Rfl:   Past Medical History: Past Medical History:  Diagnosis Date  . AICD (automatic cardioverter/defibrillator) present    Medtronic  . Arthritis    "thumbs"  (08/02/2017)  . CHF (congestive heart failure) ([place])   . Chronic combined systolic and diastolic heart failure ([place])    a. 08/2017 Echo: EF 20-25%, mod glob HK. Sev distal ant sept, inflat HK. Apical AK. Gr2 DD. Mildly reduced RV fxn.  . Colon polyps   . Coronary artery disease    a. s/p CABG x 2 (LIMA->LAD, VG->OM); b. Multiple PCI's to LM/LCX/OM; c. 07/2017 PTCA of LM/LCX w/ early ISR-->repeat PCI/DES to LM (3.5x20 Synergy DES) and LCX (3.0x20 Synergy DES); d. 07/2018 Relook Cath: LM patent stent, LAD 100ost, LCX patent stent, OM1 99/60, LIMA->LAD ok. VG->dLCX 57  (old).  . Depression   . Encounter for assessment of implantable cardioverter-defibrillator (ICD) 09/27/2018  . High cholesterol   . Hypertension   . ICD; Biventricular  Medtronic ICD Amplia MRI QWuad CRT-D  in situ 10/29/14 10/29/2014   Remote ICD check 09.23.20:  One 6 beat NSVT. No therapy.  1 SVT episode @ 130 bpm (38 Sec).  There were 23 Vent sense episodes detected for up to 1.1 min/day (AT). Health trends (patient activity, heart rate variability, average heart rates) are stable.Trans-thoracic impedance trends and the OptiVol Fluid Index do no present significant abnormalities. Battery longevity is 4.3 years. RA pacing is 3  . Ischemic cardiomyopathy 09/27/2018  . MI (myocardial infarction) ([place]) 2003  . NSTEMI (non-ST elevated myocardial infarction) ([place]) 07/16/2014  . OSA on CPAP   . Oxygen deficiency   . Pneumonia 10/2014  . Proteinuria   . Sleep apnea   . 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 Packs/day: 0.25  . Years: 27.00  . Pack years: 6.75  . Types: Cigars  . Quit date: 2002  . Years since quitting: 19.3  Smokeless Tobacco Former Systems developer  . Quit date: 2002    Labs: Recent Review Flowsheet Data    Labs for ITP Cardiac and Pulmonary Rehab Latest Ref Rng & Units 11/21/2018 12/27/2018 01/03/2019 01/04/2019 04/18/2019   Cholestrol 100 - 199 mg/dL - 103 - 142 127   LDLCALC 0 - 99 mg/dL - 34 - 67 58   LDLDIRECT mg/dL - - - - -   HDL >39 mg/dL - 32.10(L) - 45 39(L)   Trlycerides 0 - 149 mg/dL - 182.0(H) - 148 181(H)   Hemoglobin A1c 4.8 - 5.6 % 14.7 11.6(H) - - 12.1(H)   PHART 7.350 - 7.450 - - - - -   PCO2ART 32.0 - 48.0 mmHg - - - - -   HCO3 20.0 - 28.0 mmol/L - - 22.9 - -   TCO2 22 - 32 mmol/L - - - - -   ACIDBASEDEF 0.0 - 2.0 mmol/L - - 5.5(H) - -   O2SAT % - - 92.7 - -       Exercise Target Goals: Exercise Program Goal: Individual exercise prescription set using results  from initial 6 min walk test and THRR while considering  patient's activity barriers and safety.   Exercise Prescription Goal: Initial exercise prescription builds to 30-45 minutes a day of aerobic activity, 2-3 days per week.  Home exercise guidelines will be given to patient during program as part of exercise  prescription that the participant will acknowledge.   Education: Aerobic Exercise & Resistance Training: - Gives group verbal and written instruction on the various components of exercise. Focuses on aerobic and resistive training programs and the benefits of this training and how to safely progress through these programs..   Education: Exercise & Equipment Safety: - Individual verbal instruction and demonstration of equipment use and safety with use of the equipment.   Cardiac Rehab from 03/06/2019 in [facility] Cardiac and Pulmonary Rehab  Date  03/06/19  Educator  [facility]  Instruction Review Code  1- Verbalizes Understanding      Education: Exercise Physiology & General Exercise Guidelines: - Group verbal and written instruction with models to review the exercise physiology of the cardiovascular system and associated critical values. Provides general exercise guidelines with specific guidelines to those with heart or lung disease.    Education: Flexibility, Balance, Mind/Body Relaxation: Provides group verbal/written instruction on the benefits of flexibility and balance training, including mind/body exercise modes such as yoga, pilates and tai chi.  Demonstration and skill practice provided.   Activity Barriers & Risk Stratification: Activity Barriers & Cardiac Risk Stratification - 03/06/19 1527      Activity Barriers & Cardiac Risk Stratification   Activity Barriers  Arthritis;Back Problems;Deconditioning;Muscular Weakness;Joint Problems;Balance Concerns   arth is thumbs and back   Cardiac Risk Stratification  High       6 Minute Walk: 6 Minute Walk    Row Name 03/06/19 1526          6 Minute Walk   Phase  Initial     Distance  1325 feet     Walk Time  6 minutes     # of Rest Breaks  0     MPH  2.51     METS  3.18     RPE  13     Perceived Dyspnea   1     VO2 Peak  11.15     Symptoms  Yes (comment)     Comments  SOB     Resting HR  65 bpm     Resting BP  124/70     Resting Oxygen Saturation   97 %     Exercise Oxygen Saturation  during 6 min walk  98 %     Max Ex. HR  110 bpm     Max Ex. BP  138/74     2 Minute Post BP  128/74        Oxygen Initial Assessment:   Oxygen Re-Evaluation:   Oxygen Discharge (Final Oxygen Re-Evaluation):   Initial Exercise Prescription: Initial Exercise Prescription - 03/06/19 1500      Date of Initial Exercise RX and Referring Provider   Date  03/06/19    Referring Provider  Adrian Prows MD      Treadmill   MPH  2.3    Grade  0.5    Minutes  15    METs  2.9      NuStep   Level  2    SPM  80    Minutes  15    METs  2      Biostep-RELP   Level  2    SPM  50    Minutes  15    METs  2      Prescription Details   Frequency (times per week)  3    Duration  Progress to 30 minutes of continuous aerobic without signs/symptoms of physical distress  Intensity   THRR 40-80% of Max Heartrate  102-139    Ratings of Perceived Exertion  11-13    Perceived Dyspnea  0-4      Progression   Progression  Continue to progress workloads to maintain intensity without signs/symptoms of physical distress.      Resistance Training   Training Prescription  Yes    Weight  3 lb       Perform Capillary Blood Glucose checks as needed.  Exercise Prescription Changes: Exercise Prescription Changes    Row Name 03/06/19 1500 03/15/19 0700 03/28/19 1800 03/29/19 1500 04/10/19 1500     Response to Exercise   Blood Pressure (Admit)  124/70  98/60  126/74  --  138/82   Blood Pressure (Exercise)  138/74  144/60  124/70  --  140/80   Blood Pressure (Exit)  128/74  90/52  122/70  --  128/68   Heart Rate (Admit)  65  bpm  82 bpm  91 bpm  --  86 bpm   Heart Rate (Exercise)  110 bpm  92 bpm  102 bpm  --  97 bpm   Heart Rate (Exit)  75 bpm  71 bpm  81 bpm  --  75 bpm   Oxygen Saturation (Admit)  97 %  --  --  --  --   Oxygen Saturation (Exercise)  98 %  --  --  --  --   Rating of Perceived Exertion (Exercise)  '13  15  14  ' --  12   Perceived Dyspnea (Exercise)  1  --  --  --  --   Symptoms  SOB  back pain on TM  none  --  none   Comments  walk test results  second full day of exericse  --  --  --   Duration  --  Continue with 30 min of aerobic exercise without signs/symptoms of physical distress.  Continue with 30 min of aerobic exercise without signs/symptoms of physical distress.  --  Continue with 30 min of aerobic exercise without signs/symptoms of physical distress.   Intensity  --  THRR unchanged  THRR unchanged  --  THRR unchanged     Progression   Progression  --  Continue to progress workloads to maintain intensity without signs/symptoms of physical distress.  Continue to progress workloads to maintain intensity without signs/symptoms of physical distress.  --  Continue to progress workloads to maintain intensity without signs/symptoms of physical distress.   Average METs  --  2.38  2.8  --  3.2     Resistance Training   Training Prescription  --  Yes  Yes  --  Yes   Weight  --  3 lb  3 lb  --  3 lb   Reps  --  10-15  10-15  --  10-15     Interval Training   Interval Training  --  No  No  --  No     Treadmill   MPH  --  1.5  2.3  --  1.3   Grade  --  0.5  0.5  --  0.5   Minutes  --  15  15  --  15   METs  --  2.25  2.9  --  2.9     NuStep   Level  --  2  4  --  4   SPM  --  --  80  --  --   Minutes  --  15  15  --  15   METs  --  2.9  2.7  --  3.2     Biostep-RELP   Level  --  2  --  --  4   Minutes  --  15  --  --  15   METs  --  2  --  --  3.7     Home Exercise Plan   Plans to continue exercise at  --  --  --  Home (comment) walking, rec bike  Home (comment) walking, rec bike    Frequency  --  --  --  Add 2 additional days to program exercise sessions.  Add 2 additional days to program exercise sessions.   Initial Home Exercises Provided  --  --  --  03/29/19  03/29/19   Row Name 04/25/19 1400 05/10/19 1200           Response to Exercise   Blood Pressure (Admit)  124/60  136/80      Blood Pressure (Exercise)  134/74  160/64      Blood Pressure (Exit)  126/64  128/64      Heart Rate (Admit)  58 bpm  82 bpm      Heart Rate (Exercise)  102 bpm  127 bpm      Heart Rate (Exit)  77 bpm  81 bpm      Rating of Perceived Exertion (Exercise)  13  11      Symptoms  none  none      Duration  Continue with 30 min of aerobic exercise without signs/symptoms of physical distress.  Continue with 30 min of aerobic exercise without signs/symptoms of physical distress.      Intensity  THRR unchanged  THRR unchanged        Progression   Progression  Continue to progress workloads to maintain intensity without signs/symptoms of physical distress.  Continue to progress workloads to maintain intensity without signs/symptoms of physical distress.      Average METs  2.68  3.22        Resistance Training   Training Prescription  Yes  Yes      Weight  3 lb  3 lb      Reps  10-15  10-15        Interval Training   Interval Training  No  No        Treadmill   MPH  2.3  1.7      Grade  0.5  4      Minutes  15  15      METs  2.92  3.24        NuStep   Level  4  --      Minutes  15  --      METs  3.8  --        REL-XR   Level  --  4      Minutes  --  15      METs  --  3.2        Biostep-RELP   Level  4  --      Minutes  15  --      METs  3  --        Home Exercise Plan   Plans to continue exercise at  Home (comment) walking, rec bike  Home (comment) walking, rec bike      Frequency  Add 2 additional days to program  exercise sessions.  Add 2 additional days to program exercise sessions.      Initial Home Exercises Provided  03/29/19  03/29/19         Exercise  Comments:   Exercise Goals and Review: Exercise Goals    Row Name 03/06/19 1538             Exercise Goals   Increase Physical Activity  Yes       Intervention  Provide advice, education, support and counseling about physical activity/exercise needs.;Develop an individualized exercise prescription for aerobic and resistive training based on initial evaluation findings, risk stratification, comorbidities and participant's personal goals.       Expected Outcomes  Short Term: Attend rehab on a regular basis to increase amount of physical activity.;Long Term: Add in home exercise to make exercise part of routine and to increase amount of physical activity.;Long Term: Exercising regularly at least 3-5 days a week.       Increase Strength and Stamina  Yes       Intervention  Provide advice, education, support and counseling about physical activity/exercise needs.;Develop an individualized exercise prescription for aerobic and resistive training based on initial evaluation findings, risk stratification, comorbidities and participant's personal goals.       Expected Outcomes  Short Term: Increase workloads from initial exercise prescription for resistance, speed, and METs.;Short Term: Perform resistance training exercises routinely during rehab and add in resistance training at home;Long Term: Improve cardiorespiratory fitness, muscular endurance and strength as measured by increased METs and functional capacity (6MWT)       Able to understand and use rate of perceived exertion (RPE) scale  Yes       Intervention  Provide education and explanation on how to use RPE scale       Expected Outcomes  Short Term: Able to use RPE daily in rehab to express subjective intensity level;Long Term:  Able to use RPE to guide intensity level when exercising independently       Able to understand and use Dyspnea scale  Yes       Intervention  Provide education and explanation on how to use Dyspnea scale        Expected Outcomes  Short Term: Able to use Dyspnea scale daily in rehab to express subjective sense of shortness of breath during exertion;Long Term: Able to use Dyspnea scale to guide intensity level when exercising independently       Knowledge and understanding of Target Heart Rate Range (THRR)  Yes       Intervention  Provide education and explanation of THRR including how the numbers were predicted and where they are located for reference       Expected Outcomes  Short Term: Able to state/look up THRR;Short Term: Able to use daily as guideline for intensity in rehab;Long Term: Able to use THRR to govern intensity when exercising independently       Able to check pulse independently  Yes       Intervention  Provide education and demonstration on how to check pulse in carotid and radial arteries.;Review the importance of being able to check your own pulse for safety during independent exercise       Expected Outcomes  Short Term: Able to explain why pulse checking is important during independent exercise;Long Term: Able to check pulse independently and accurately       Understanding of Exercise Prescription  Yes       Intervention  Provide education, explanation, and  written materials on patient's individual exercise prescription       Expected Outcomes  Short Term: Able to explain program exercise prescription;Long Term: Able to explain home exercise prescription to exercise independently          Exercise Goals Re-Evaluation : Exercise Goals Re-Evaluation    Row Name 03/08/19 1624 03/15/19 0743 03/28/19 1809 03/29/19 1513 04/10/19 1557     Exercise Goal Re-Evaluation   Exercise Goals Review  Increase Physical Activity;Able to understand and use rate of perceived exertion (RPE) scale;Knowledge and understanding of Target Heart Rate Range (THRR);Understanding of Exercise Prescription;Increase Strength and Stamina;Able to check pulse independently  Increase Physical Activity;Increase Strength and  Stamina;Understanding of Exercise Prescription  Increase Physical Activity;Increase Strength and Stamina;Able to understand and use rate of perceived exertion (RPE) scale;Able to understand and use Dyspnea scale;Knowledge and understanding of Target Heart Rate Range (THRR);Able to check pulse independently;Understanding of Exercise Prescription  Increase Physical Activity;Increase Strength and Stamina;Able to understand and use rate of perceived exertion (RPE) scale;Able to understand and use Dyspnea scale;Knowledge and understanding of Target Heart Rate Range (THRR);Able to check pulse independently;Understanding of Exercise Prescription  Increase Physical Activity;Increase Strength and Stamina;Understanding of Exercise Prescription   Comments  Reviewed RPE scale, THR and program prescription with pt today.  Pt voiced understanding and was given a copy of goals to take home.  Bert is off to a good start in rehab.  He has completed his first two full days of exercise.  He was only able to get 8 min on the treadmill.  We will continue to work with him on this.  We will continue to monitor  his progress.  Cashel has progressed to level 4 on NS and level 3 on Biostep.  Staff will monitor progress.  Reviewed home exercise with pt today.  Pt plans to walk and use bike at home for exercise.  Also talked about using staff videos. Reviewed THR, pulse, RPE, sign and symptoms, NTG use, and when to call 911 or MD.  Also discussed weather considerations and indoor options.  Pt voiced understanding.  Wenzel is doing well in rehab.  He is up to level 4 on the NuStep. We will continue to montior his progress.   Expected Outcomes  Short: Use RPE daily to regulate intensity. Long: Follow program prescription in THR.  Short: Continue to attend rehab regularly  Long: Continue to improve stamina.  Short : continue to build stamina on TM Long:  improve overall MET level  Short: Start to add in walking at home.  Long: Continue to improve  stamina.  Short: Increase weights  Long: Continue to improve stamina.   [place] Name 04/25/19 1402 05/10/19 1200           Exercise Goal Re-Evaluation   Exercise Goals Review  Increase Physical Activity;Increase Strength and Stamina;Understanding of Exercise Prescription  Increase Physical Activity;Increase Strength and Stamina;Understanding of Exercise Prescription      Comments  Bawi has been doing well in rehab.  His back continues to give him difficulty on the treadmill.  Some days he is able to work through it, others he prefers to just use the seated equipment.  We will continue to monitor his progress.  Tarvaris continues to do well in rehab.  His back still limits his time on the treadmill.  He is doing well in the earlier class this month, but will switch back in May.  We will continue to monitor his progress.  Expected Outcomes  Short: Take advantage of treadmill on good days  Long: Continue to improve stamina.  Short: Continue to work with back pain.  Long; Continue to improve stamina.         Discharge Exercise Prescription (Final Exercise Prescription Changes): Exercise Prescription Changes - 05/10/19 1200      Response to Exercise   Blood Pressure (Admit)  136/80    Blood Pressure (Exercise)  160/64    Blood Pressure (Exit)  128/64    Heart Rate (Admit)  82 bpm    Heart Rate (Exercise)  127 bpm    Heart Rate (Exit)  81 bpm    Rating of Perceived Exertion (Exercise)  11    Symptoms  none    Duration  Continue with 30 min of aerobic exercise without signs/symptoms of physical distress.    Intensity  THRR unchanged      Progression   Progression  Continue to progress workloads to maintain intensity without signs/symptoms of physical distress.    Average METs  3.22      Resistance Training   Training Prescription  Yes    Weight  3 lb    Reps  10-15      Interval Training   Interval Training  No      Treadmill   MPH  1.7    Grade  4    Minutes  15    METs  3.24       REL-XR   Level  4    Minutes  15    METs  3.2      Home Exercise Plan   Plans to continue exercise at  Home (comment)   walking, rec bike   Frequency  Add 2 additional days to program exercise sessions.    Initial Home Exercises Provided  03/29/19       Nutrition:  Target Goals: Understanding of nutrition guidelines, daily intake of sodium <1532m, cholesterol <206m calories 30% from fat and 7% or less from saturated fats, daily to have 5 or more servings of fruits and vegetables.  Education: Controlling Sodium/Reading Food Labels -Group verbal and written material supporting the discussion of sodium use in heart healthy nutrition. Review and explanation with models, verbal and written materials for utilization of the food label.   Education: General Nutrition Guidelines/Fats and Fiber: -Group instruction provided by verbal, written material, models and posters to present the general guidelines for heart healthy nutrition. Gives an explanation and review of dietary fats and fiber.   Biometrics: Pre Biometrics - 03/06/19 1606      Pre Biometrics   Height  5' 7.75" (1.721 m)    Weight  213 lb 3.2 oz (96.7 kg)    BMI (Calculated)  32.65    Single Leg Stand  3.19 seconds        Nutrition Therapy Plan and Nutrition Goals: Nutrition Therapy & Goals - 04/19/19 1530      Nutrition Therapy   Diet  HH, Low NA, DM    Protein (specify units)  80-85g    Fiber  30 grams    Whole Grain Foods  3 servings    Saturated Fats  12 max. grams    Fruits and Vegetables  5 servings/day    Sodium  1.5 grams      Personal Nutrition Goals   Nutrition Goal  ST: continue with current changes  LT: increase fitness    Comments  Beef has given him problems. No breakfast, L:  BLT or chicken/fish baked sandwich baked made himself D: chicken, sweet potato, salad (baked) - will have frozen meals occasionally. Coke zero or water (alternating). Whole wheat when he can. uses olive oil. A1c is now 8 went  down from 14. Discussed HH and DM friendly eating. Pt would like to continue with current changes.      Intervention Plan   Intervention  Prescribe, educate and counsel regarding individualized specific dietary modifications aiming towards targeted core components such as weight, hypertension, lipid management, diabetes, heart failure and other comorbidities.;Nutrition handout(s) given to patient.    Expected Outcomes  Short Term Goal: Understand basic principles of dietary content, such as calories, fat, sodium, cholesterol and nutrients.;Short Term Goal: A plan has been developed with personal nutrition goals set during dietitian appointment.;Long Term Goal: Adherence to prescribed nutrition plan.       Nutrition Assessments: Nutrition Assessments - 03/06/19 1607      MEDFICTS Scores   Pre Score  42       MEDIFICTS Score Key:          ?70 Need to make dietary changes          40-70 Heart Healthy Diet         ? 40 Therapeutic Level Cholesterol Diet  Nutrition Goals Re-Evaluation:   Nutrition Goals Discharge (Final Nutrition Goals Re-Evaluation):   Psychosocial: Target Goals: Acknowledge presence or absence of significant depression and/or stress, maximize coping skills, provide positive support system. Participant is able to verbalize types and ability to use techniques and skills needed for reducing stress and depression.   Education: Depression - Provides group verbal and written instruction on the correlation between heart/lung disease and depressed mood, treatment options, and the stigmas associated with seeking treatment.   Education: Sleep Hygiene -Provides group verbal and written instruction about how sleep can affect your health.  Define sleep hygiene, discuss sleep cycles and impact of sleep habits. Review good sleep hygiene tips.     Education: Stress and Anxiety: - Provides group verbal and written instruction about the health risks of elevated stress and causes  of high stress.  Discuss the correlation between heart/lung disease and anxiety and treatment options. Review healthy ways to manage with stress and anxiety.    Initial Review & Psychosocial Screening: Initial Psych Review & Screening - 02/28/19 1109      Initial Review   Current issues with  History of Depression;Current Psychotropic Meds;Current Stress Concerns    Source of Stress Concerns  Chronic Illness    Comments  No current symptoms of depression on Zoloft and feels that it is working well for him, significant health history      [place]?  Yes   wife     Barriers   Psychosocial barriers to participate in program  The patient should benefit from training in stress management and relaxation.;Psychosocial barriers identified (see note)      Screening Interventions   Interventions  Encouraged to exercise;To provide support and resources with identified psychosocial needs;Provide feedback about the scores to participant    Expected Outcomes  Long Term Goal: Stressors or current issues are controlled or eliminated.;Short Term goal: Identification and review with participant of any Quality of Life or Depression concerns found by scoring the questionnaire.;Short Term goal: Utilizing psychosocial counselor, staff and physician to assist with identification of specific Stressors or current issues interfering with healing process. Setting desired goal for each stressor or current issue identified.;Long Term goal:  The participant improves quality of Life and PHQ9 Scores as seen by post scores and/or verbalization of changes       Quality of Life Scores:  Quality of Life - 03/06/19 1607      Quality of Life   Select  Quality of Life      Quality of Life Scores   Health/Function Pre  20.57 %    Socioeconomic Pre  27 %    Psych/Spiritual Pre  25.5 %    Family Pre  30 %    GLOBAL Pre  24.41 %      Scores of 19 and below usually indicate a poorer quality of  life in these areas.  A difference of  2-3 points is a clinically meaningful difference.  A difference of 2-3 points in the total score of the Quality of Life Index has been associated with significant improvement in overall quality of life, self-image, physical symptoms, and general health in studies assessing change in quality of life.  PHQ-9: Recent Review Flowsheet Data    Depression screen [facility] 2/9 03/06/2019 01/15/2019 12/04/2018 09/29/2018 08/30/2018   Decreased Interest 0 0 0 0 0   Down, Depressed, Hopeless 0 0 0 0 0   PHQ - 2 Score 0 0 0 0 0   Altered sleeping 1 - - 0 -   Tired, decreased energy 0 - - 0 -   Change in appetite 0 - - 0 -   Feeling bad or failure about yourself  0 - - 0 -   Trouble concentrating 0 - - 0 -   Moving slowly or fidgety/restless 0 - - 0 -   Suicidal thoughts 0 - - 0 -   PHQ-9 Score 1 - - 0 -   Difficult doing work/chores Not difficult at all - - Not difficult at all -     Interpretation of Total Score  Total Score Depression Severity:  1-4 = Minimal depression, 5-9 = Mild depression, 10-14 = Moderate depression, 15-19 = Moderately severe depression, 20-27 = Severe depression   Psychosocial Evaluation and Intervention: Psychosocial Evaluation - 02/28/19 1115      Psychosocial Evaluation & Interventions   Interventions  Encouraged to exercise with the program and follow exercise prescription    Comments  Ashland 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  He has a Estate manager/land agent in place.  He also has uncontrolled diabetes according to office notes, but feels that he is doing well overall.  His wife is his primary support system and they try not to have too many worries.  He is looking forward to getting his heart stronger and back in better shape overall.    Expected Outcomes  Short: Attend rehab to rebuild stamina again.  Long: Continue to cope well with his health.    Continue  Psychosocial Services   Follow up required by staff       Psychosocial Re-Evaluation: Psychosocial Re-Evaluation    [place] Name 04/09/19 1535 05/02/19 1312           Psychosocial Re-Evaluation   Current issues with  Current Stress Concerns  Current Stress Concerns      Comments  Deans wife has had shoulder surgery and is helping taking care of her. He has back pain and it is tough to care for her at times. Overall he has a good outlook on his health.  He can  look to his wife, son and daughter for support.  [place] is coming to HT at a different time due to his wifes PT.  He has no other stress concerns - he says he got rid of stress when he retired.      Expected Outcomes  Short: Attend [facility] stress management education to decrease stress. Long: Maintain exercise Post HeartTrack to keep stress at a minimum.  Short: continue to attend HT Long: maintain positive attitude      Interventions  Encouraged to attend Cardiac Rehabilitation for the exercise  --      Continue Psychosocial Services   Follow up required by staff  --         Psychosocial Discharge (Final Psychosocial Re-Evaluation): Psychosocial Re-Evaluation - 05/02/19 1312      Psychosocial Re-Evaluation   Current issues with  Current Stress Concerns    Comments  Quadir is coming to HT at a different time due to his wifes PT.  He has no other stress concerns - he says he got rid of stress when he retired.    Expected Outcomes  Short: continue to attend HT Long: maintain positive attitude       Vocational Rehabilitation: Provide vocational rehab assistance to qualifying candidates.   Vocational Rehab Evaluation & Intervention: Vocational Rehab - 02/28/19 1109      Initial Vocational Rehab Evaluation & Intervention   Assessment shows need for Vocational Rehabilitation  No       Education: Education Goals: Education classes will be provided on a variety of topics geared toward better understanding of heart health and risk  factor modification. Participant will state understanding/return demonstration of topics presented as noted by education test scores.  Learning Barriers/Preferences: Learning Barriers/Preferences - 02/28/19 1108      Learning Barriers/Preferences   Learning Barriers  Sight   glasses   Learning Preferences  None       General Cardiac Education Topics:  AED/CPR: - Group verbal and written instruction with the use of models to demonstrate the basic use of the AED with the basic ABC's of resuscitation.   Anatomy & Physiology of the Heart: - Group verbal and written instruction and models provide basic cardiac anatomy and physiology, with the coronary electrical and arterial systems. Review of Valvular disease and Heart Failure   Cardiac Procedures: - Group verbal and written instruction to review commonly prescribed medications for heart disease. Reviews the medication, class of the drug, and side effects. Includes the steps to properly store meds and maintain the prescription regimen. (beta blockers and nitrates)   Cardiac Medications I: - Group verbal and written instruction to review commonly prescribed medications for heart disease. Reviews the medication, class of the drug, and side effects. Includes the steps to properly store meds and maintain the prescription regimen.   Cardiac Medications II: -Group verbal and written instruction to review commonly prescribed medications for heart disease. Reviews the medication, class of the drug, and side effects. (all other drug classes)    Go Sex-Intimacy & Heart Disease, Get SMART - Goal Setting: - Group verbal and written instruction through game format to discuss heart disease and the return to sexual intimacy. Provides group verbal and written material to discuss and apply goal setting through the application of the S.M.A.R.T. Method.   Other Matters of the Heart: - Provides group verbal, written materials and models to describe  Stable Angina and Peripheral Artery. Includes description of the disease process and treatment options available to the cardiac  patient.   Infection Prevention: - Provides verbal and written material to individual with discussion of infection control including proper hand washing and proper equipment cleaning during exercise session.   Cardiac Rehab from 03/06/2019 in [facility] Cardiac [facility] Pulmonary Re[facility]  Date  03/06/19  Educator  [facility]  Instruction Review Code  1- Verbalizes Understanding      Falls Prevention: - Provides verbal and written material to individual with discussion of falls prevention and safety.   Cardiac Rehab from 03/06/2019 in [facility] Cardiac and Pulmonary Rehab  Date  03/06/19  Educator  [facility]  Instruction Review Code  1- Verbalizes Understanding      Other: -Provides group and verbal instruction on various topics (see comments)   Knowledge Questionnaire Score: Knowledge Questionnaire Score - 03/06/19 1607      Knowledge Questionnaire Score   Pre Score  24/26 Education Focus: Angina and Nurtition       Core Components/Risk Factors/Patient Goals at Admission: Personal Goals and Risk Factors at Admission - 03/06/19 1608      Core Components/Risk Factors/Patient Goals on Admission    Weight Management  Yes;Weight Loss;Obesity    Intervention  Weight Management: Develop a combined nutrition and exercise program designed to reach desired caloric intake, while maintaining appropriate intake of nutrient and fiber, sodium and fats, and appropriate energy expenditure required for the weight goal.;Weight Management: Provide education and appropriate resources to help participant work on and attain dietary goals.;Weight Management/Obesity: Establish reasonable short term and long term weight goals.;Obesity: Provide education and appropriate resources to help participant work on and attain dietary goals.    Admit Weight  213 lb 3.2 oz (96.7 kg)    Goal Weight: Short Term  208 lb (94.3  kg)    Goal Weight: Long Term  200 lb (90.7 kg)    Expected Outcomes  Long Term: Adherence to nutrition and physical activity/exercise program aimed toward attainment of established weight goal;Short Term: Continue to assess and modify interventions until short term weight is achieved;Weight Loss: Understanding of general recommendations for a balanced deficit meal plan, which promotes 1-2 lb weight loss per week and includes a negative energy balance of [phone] kcal/d;Understanding recommendations for meals to include 15-35% energy as protein, 25-35% energy from fat, 35-60% energy from carbohydrates, less than 268m of dietary cholesterol, 20-35 gm of total fiber daily;Understanding of distribution of calorie intake throughout the day with the consumption of 4-5 meals/snacks    Diabetes  Yes    Intervention  Provide education about signs/symptoms and action to take for hypo/hyperglycemia.;Provide education about proper nutrition, including hydration, and aerobic/resistive exercise prescription along with prescribed medications to achieve blood glucose in normal ranges: Fasting glucose 65-99 mg/dL    Expected Outcomes  Short Term: Participant verbalizes understanding of the signs/symptoms and immediate care of hyper/hypoglycemia, proper foot care and importance of medication, aerobic/resistive exercise and nutrition plan for blood glucose control.;Long Term: Attainment of HbA1C < 7%.    Heart Failure  Yes    Intervention  Provide a combined exercise and nutrition program that is supplemented with education, support and counseling about heart failure. Directed toward relieving symptoms such as shortness of breath, decreased exercise tolerance, and extremity edema.    Expected Outcomes  Improve functional capacity of life;Short term: Attendance in program 2-3 days a week with increased exercise capacity. Reported lower sodium intake. Reported increased fruit and vegetable intake. Reports medication  compliance.;Short term: Daily weights obtained and reported for increase. Utilizing diuretic protocols set by physician.;Long term:  Adoption of self-care skills and reduction of barriers for early signs and symptoms recognition and intervention leading to self-care maintenance.    Hypertension  Yes    Intervention  Provide education on lifestyle modifcations including regular physical activity/exercise, weight management, moderate sodium restriction and increased consumption of fresh fruit, vegetables, and low fat dairy, alcohol moderation, and smoking cessation.;Monitor prescription use compliance.    Expected Outcomes  Short Term: Continued assessment and intervention until BP is < 140/17m HG in hypertensive participants. < 130/841mHG in hypertensive participants with diabetes, heart failure or chronic kidney disease.    Lipids  Yes    Intervention  Provide education and support for participant on nutrition & aerobic/resistive exercise along with prescribed medications to achieve LDL <7060mHDL >79m80m  Expected Outcomes  Short Term: Participant states understanding of desired cholesterol values and is compliant with medications prescribed. Participant is following exercise prescription and nutrition guidelines.;Long Term: Cholesterol controlled with medications as prescribed, with individualized exercise RX and with personalized nutrition plan. Value goals: LDL < 70mg24mL > 40 mg.       Education:Diabetes - Individual verbal and written instruction to review signs/symptoms of diabetes, desired ranges of glucose level fasting, after meals and with exercise. Acknowledge that pre and post exercise glucose checks will be done for 3 sessions at entry of program.   Education: Know Your Numbers and Risk Factors: -Group verbal and written instruction about important numbers in your health.  Discussion of what are risk factors and how they play a role in the disease process.  Review of Cholesterol,  Blood Pressure, Diabetes, and BMI and the role they play in your overall health.   Core Components/Risk Factors/Patient Goals Review:  Goals and Risk Factor Review    Row Name 04/09/19 1538 05/02/19 1309           Core Components/Risk Factors/Patient Goals Review   Personal Goals Review  Weight Management/Obesity;Diabetes;Lipids;Hypertension;Heart Failure  --      Review  Demetry Katieaking his medications properly and has been to the doctors recently for his diabetes. His A1C went from an 11 to a 10. Zeferino Antoneyno questions about his medications at this time. He is checking his sugar two times a day, one time in the morning and one time at night.  Takuma Christoper check BG at home but hasnt today.  He is taking all meds as directed.  He states hes not getting winded as quick with ADLs.      Expected Outcomes  Short: Exercise and take medications as prescribed to help with overall health. Long: maintain exercise and medications independently to improve upon overall health.  Short: continue to exercise and monitor sugars Long: manage risk factors         Core Components/Risk Factors/Patient Goals at Discharge (Final Review):  Goals and Risk Factor Review - 05/02/19 1309      Core Components/Risk Factors/Patient Goals Review   Review  Clance Mykell check BG at home but hasnt today.  He is taking all meds as directed.  He states hes not getting winded as quick with ADLs.    Expected Outcomes  Short: continue to exercise and monitor sugars Long: manage risk factors       ITP Comments: ITP Comments    Row Name 02/28/19 1120 03/06/19 1526 03/08/19 1622 03/21/19 0704 04/18/19 1026   ITP Comments  Completed virtual orientation today.  EP eval scheduled for 2/9 at 2pm. Documentation can be found in  [facility] encounter 01/08/19.  Completed 6MWT and gym orientation.  Initial ITP created and sent for review to Dr. Emily Filbert, Medical Director.  First full day of exercise!  Patient was oriented to gym and equipment  including functions, settings, policies, and procedures.  Patient's individual exercise prescription and treatment plan were reviewed.  All starting workloads were established based on the results of the 6 minute walk test done at initial orientation visit.  The plan for exercise progression was also introduced and progression will be customized based on patient's performance and goals.  30 day chart review completed. ITP sent to Dr Zachery Dakins Medical Director, for review,changes as needed and signature.  30 day chart review completed. ITP sent to Dr Zachery Dakins Medical Director, for review,changes as needed and signature.   [place] Name 05/16/19 0620           ITP Comments  30 Day review completed. Medical Director review done, changes made as directed,and approval shown by signature of Market researcher.          Comments:

## 2019-05-16 NOTE — Telephone Encounter (Signed)
Called per [facility] follow up. Reviewed and updated med list together. Wt readings remains stable and down trending. Pt reports to be feeling well overall and reports no complains since last OV. Pt reports to be going to cardiac rehab and is able to tolerate it without any issues. Denies any recent complains of SOB, edema, dyspnea. Denies any recent CHF related complications or hospitalizations. Will continue current therapy and remote weight monitoring. Pt aware to let the office know if his symptoms worsen.

## 2019-05-18 ENCOUNTER — Other Ambulatory Visit: Payer: Self-pay | Admitting: *Deleted

## 2019-05-18 ENCOUNTER — Encounter: Payer: Self-pay | Admitting: *Deleted

## 2019-05-18 NOTE — Patient Outreach (Signed)
[place] [facility]) Care Management [place] Telephone Outreach Post-hospital discharge day # 127 without unplanned hospital re-admission [place] Telephone [facility] Coordination   05/18/2019  Martin Bush 11-Nov-1955 [id number]  Successful telephone outreach to [name], 64 y/o male referred 01/12/2019 to [place] by [facility] Liaison after recent hospitalization December 9- 17, 2020 for acute pulmonary edema/ NSTEMI with native vessel disease and stent re-stenosis; patient had cardiac catherization 01/08/2019 and eventual PCI/ angioplasty.Patient was discharged home to self care withouthome health services in place. 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  HIPAA/ identity verified with patient; patient reports "doing pretty good," and he denies pain, new/ recent falls; reports he continues driving self to appointments/ errands, and he sounds in no distress throughout call today.  Patient tells me that he had a "bad day" on Wednesday 05/16/19-- stated he took his lunch time insulin but did not eat and then attended cardiac rehabilitation session; stated he got sick during visit and was hypoglycemic; states cardiac rehab team provided him with crackers, and his sugar increased, and he was better.  Denies other hypoglycemic episodes since last outreach.  Patient further reports:  -- no medicationconcerns; no recently prescribed changes to medications; continues self-managing medications; re-reviewed medications with patient today, and medication list updated in EHR accordingly ---- counseled patient on importance of eating when he takes meal time insulin, given his report of hypoglycemia at cardiac rehab session this week ---- continues taking lasix "only as needed for weight gain;" reports took last dose on Monday 05/14/19; confirms that  cardiology provider is aware of his current use of lasix and agrees with prn use  -- reviewed with patient recent provider appointment with cardiology 04/16/19; verbalizes accurate understanding of upcoming appointments and plans to attend all- continues driving self; wife assistsif indicated ---- cardiology follow up appointment4/29/21: verbalizes plans to attend ---- cardiac rehabilitation in progress: reports adherence to attending all sessions and states "going good;" states that he "can tell" his activity tolerance has improved since starting cardiac rehabilitation ---- reviewed with patient last office visit with endocrinology 04/09/19 and discussed need to schedule follow up appointment for June 2021: patient agrees to schedule appointment; tells me he requested [name] from endocrinologist at time of visit, "but hasn't heard anything back;" will place care coordination outreach to patient's endocrinologist to follow up on patient's request   Self-health management ofCHF/ CAD/ DM: --continuesusinghome O2 at 2 L/min "at night only;" also uses with CPAP; has not needed to use home O2 except at night, per patient report; adherent to CPAP- denies problems with either -- hascontinueddaily weight monitoring/ recording at home; reviewed with patient recent daily recorded weights recorded at home, and today he reports ranges between 206-210, which is markedly improved from last outreach report of 219-220 lbs; confirmed that patient remains able to independently verbalize weight gain guidelines in setting of CHF with appropriate established action plan; as noted above, took last dose of lasix on Monday 05/14/19 -- denies issues with breathing status: believes he is at baseline, "or better," now that he is attending cardiac rehabilitation -- has continues actively participating in cardiac rehabilitation 3 times per week; positive reinforcement provided  -- continues "occasionally"  monitoring/ recording blood sugars at homeand reports he does not monitor blood sugars at home consistently as he does not like sticking himself; states  he checks blood sugars at home "about 2-3 times a week;" discussed need for consistent monitoring at home for optimum self-management of DM. ---- reports he attended new patient endocrinology appointment April 09, 2019 at [facility] and requested [employer]/ CGM system, but "they never got back with me;" patient states that he does not yet have scheduled follow up appointment with endocrinologist ---- discussed that I would place care coordination outreach to follow up, and encouraged patient to follow up with provider as well, should he not hear back within a reasonable time period ---- using teach back method, again reviewed with patient significance of A1-C level (most recently obtained 04/18/19 during cardiology provider office visit-- resulted at 12.1 !!) and reviewed his personal trends over last 18 months; patient has misplaced previously provided educational material mailed to him-- I will re-send  ---- reports fasting blood sugar trends "generally" 175-200 and post-prandial "always between 200-300" ---- nutritional assessment completed; discussed with patient value of keeping food journal; encouraged him to resume consistent blood sugar monitoring at least one time per day, either fasting or 2-hours after a meal and using teach back method, reviewed with patient why these times are value able for managing treatment for DM; patient agrees today to resume monitoring blood sugars at home daily and will consider keeping food journal- patient states he has given up eating white potatoes and red meat, except on rare occasions -- discussed cardiac complications around DM in setting of known CAD/ CHF  Patient denies further issues, concerns, or problems today. Iconfirmed that patient hasmy direct phone number, the main [facility] CM office phone  number, and the [facility] CM 24-hour nurse advice phone number should issues arise prior to next scheduled [facility] CM outreachnext month. Encouraged patient to contact me directly if needs, questions, issues, or concerns arise prior to next scheduled outreach; patient agreed to do so.  Plan:  Patient will take medications as prescribed and will attend all scheduled provider appointments  Patient will promptly notify care providers for any new concerns/ issues/ problems that arise  Patient willcontinuemonitoring/ recording daily weights at home and will follow weight gain guidelines and corresponding action plan for weight gain  I will place care coordination outreach to patient's endocrinology provider to inform of most recent A1-C level; inquire about his previously placed request for CGM; and to encouraged next appointment be scheduled with patient  Patient will continue following heart healthy and diabetic diet and will consider food journaling  Patient will continue monitoring and recording blood sugars at home every day at least one time per day  I will re-send patient previously mailed printed educational material around A1-C correlation to blood sugars at home, for his review  [place] outreach to continue with scheduled phone callnext month, unless indicated sooner   [facility] CM Care Plan Problem Two     Most Recent Value  Care Plan Problem Two  Ongoing reinforcement of self-health management of chronic disease states of CHF and Diabetes, as evidenced by patient reporting   Role Documenting the Problem Two  Care Management Coordinator  Care Plan for Problem Two  Active  Interventions for Problem Two Long Term Goal   Discussed with patient current clinical condition, recent provider office visits, daily weight trends,  confirmed that patient is not consistently monitoring his blood sugars at home and discussed his barriers to completing consistent blood sugar monitoring at home,   provided ongoing education around importance of blood sugar monitoring at home and most  recent A1-C level,  placed care coordination outreach to endocrinologist to inquire about CGM system,  discussed with patient his recent hypoglycemic episode at cardiac rehabilitation and completed medication review,  discussed with patient importance of taking meal time insulin only if he eats.  Provided education around value of food journaling and best times to monitor blood sugars at home  [place] Term Goal  Over the next 60 days, patient will continue monitoring and recording daily weights and blood sugars at home, as evidenced by patient reporting and review of same during [facility] RN CM outreach  [place] Term Goal Start Date  04/13/19  [facility] CM Short Term Goal #1   Over the next 30 days, patient will schedule next appointment (due in June) with endocrinology provider, as evidenced by patient reporting and collaboration with endocrinology provider as indicated during [facility] RN CM outreach  [facility] CM Short Term Goal #1 Start Date  05/18/19  Interventions for Short Term Goal #2   Confirmed that patient has had initial office visit with endocrinology provider and has next appointment recommended in June 2021,  placed care coordination outreach to endocrinology provider to follow up on his previous request for CGM system  [facility] CM Short Term Goal #2   Over the next 30 days, patient will verbalize most recent A1-C level as evidenced by patient reporting and review of EMR during [facility] RN CM outreach [Goal re-established today]  [facility] CM Short Term Goal #2 Start Date  04/13/19  [facility] CM Short Term Goal #2 Met Date  05/18/19 [Goal met]  Interventions for Short Term Goal #2  Reviewed with patient his most current A1-C level and discussed/ reviewed A1-C trends from past 18 months,  discussed cardiac effects/ complications in setting of DM,  placed printed EMMI education in mail to patient around A1-C significance, since patient reports he lost  previously provided education     Oneta Rack, RN, BSN, [place] Care Management  [phone]

## 2019-05-18 NOTE — Patient Outreach (Signed)
[place] [facility]) Care Management [place] Telephone Outreach Care Coordination  05/18/2019  Martin Bush [birth year] [id number]  [place] Telephone Outreach Care Coordination re:  Martin Bush, 64 y/o male referred 01/12/2019 to [place] by [facility] Liaison after recent hospitalization December 9- 17, 2020 for acute pulmonary edema/ NSTEMI with native vessel disease and stent re-stenosis; patient had cardiac catherization 01/08/2019 and eventual PCI/ angioplasty.Patient was discharged home to self care withouthome health services in place. 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  [facility] endocrinology provider office to follow up on patient's request for CGM system order, which was made at time of office visit 04/09/19 with Eleonore Chiquito NP; discussed with [name] need for scheduling of next office visit; and made her aware of patient's most recent A1-C which was obtained at cardiology provider office visit (12.1 on 04/18/19).  [name] stated she would contact "[name]" at endocrinology office to follow up with patient on status of order for CGM system and to schedule next appointment for June 2021  Plan:  Will collaborate as indicated with care providers  Oneta Rack, RN, BSN, [place] Care Management  [phone]

## 2019-05-21 ENCOUNTER — Other Ambulatory Visit: Payer: Self-pay

## 2019-05-21 ENCOUNTER — Encounter: Payer: Medicare Other | Admitting: *Deleted

## 2019-05-21 DIAGNOSIS — Z9861 Coronary angioplasty status: Secondary | ICD-10-CM | POA: Diagnosis not present

## 2019-05-21 DIAGNOSIS — I110 Hypertensive heart disease with heart failure: Secondary | ICD-10-CM | POA: Diagnosis not present

## 2019-05-21 DIAGNOSIS — Z7982 Long term (current) use of aspirin: Secondary | ICD-10-CM | POA: Diagnosis not present

## 2019-05-21 DIAGNOSIS — Z79899 Other long term (current) drug therapy: Secondary | ICD-10-CM | POA: Diagnosis not present

## 2019-05-21 DIAGNOSIS — G4733 Obstructive sleep apnea (adult) (pediatric): Secondary | ICD-10-CM | POA: Diagnosis not present

## 2019-05-21 DIAGNOSIS — E118 Type 2 diabetes mellitus with unspecified complications: Secondary | ICD-10-CM | POA: Diagnosis not present

## 2019-05-21 DIAGNOSIS — I214 Non-ST elevation (NSTEMI) myocardial infarction: Secondary | ICD-10-CM | POA: Diagnosis not present

## 2019-05-21 DIAGNOSIS — Z7901 Long term (current) use of anticoagulants: Secondary | ICD-10-CM | POA: Diagnosis not present

## 2019-05-21 DIAGNOSIS — I5042 Chronic combined systolic (congestive) and diastolic (congestive) heart failure: Secondary | ICD-10-CM | POA: Diagnosis not present

## 2019-05-21 DIAGNOSIS — Z87891 Personal history of nicotine dependence: Secondary | ICD-10-CM | POA: Diagnosis not present

## 2019-05-21 DIAGNOSIS — E7800 Pure hypercholesterolemia, unspecified: Secondary | ICD-10-CM | POA: Diagnosis not present

## 2019-05-21 DIAGNOSIS — Z794 Long term (current) use of insulin: Secondary | ICD-10-CM | POA: Diagnosis not present

## 2019-05-21 NOTE — Progress Notes (Signed)
Daily Session Note  Patient Details  Name: Martin Bush MRN: [id number] Date of Birth: 01-Jan-1956 Referring Provider:     Cardiac Rehab from 03/06/2019 in [facility] Cardiac and Pulmonary Rehab  Referring Provider  Adrian Prows MD      Encounter Date: 05/21/2019  Check In:      Social History   Tobacco Use  Smoking Status Former Smoker  . Packs/day: 0.25  . Years: 27.00  . Pack years: 6.75  . Types: Cigars  . Quit date: 2002  . Years since quitting: 19.3  Smokeless Tobacco Former Systems developer  . Quit date: 2002    Goals Met:  Independence with exercise equipment Exercise tolerated well No report of cardiac concerns or symptoms  Goals Unmet:  Not Applicable  Comments: Pt able to follow exercise prescription today without complaint.  Will continue to monitor for progression.     Dr. Emily Filbert is Medical Director for [place] and [facility].

## 2019-05-23 ENCOUNTER — Other Ambulatory Visit: Payer: Self-pay

## 2019-05-23 ENCOUNTER — Ambulatory Visit: Payer: No Typology Code available for payment source | Admitting: Cardiology

## 2019-05-23 ENCOUNTER — Encounter: Payer: Medicare Other | Admitting: *Deleted

## 2019-05-23 DIAGNOSIS — Z7982 Long term (current) use of aspirin: Secondary | ICD-10-CM | POA: Diagnosis not present

## 2019-05-23 DIAGNOSIS — E118 Type 2 diabetes mellitus with unspecified complications: Secondary | ICD-10-CM | POA: Diagnosis not present

## 2019-05-23 DIAGNOSIS — Z79899 Other long term (current) drug therapy: Secondary | ICD-10-CM | POA: Diagnosis not present

## 2019-05-23 DIAGNOSIS — Z794 Long term (current) use of insulin: Secondary | ICD-10-CM | POA: Diagnosis not present

## 2019-05-23 DIAGNOSIS — G4733 Obstructive sleep apnea (adult) (pediatric): Secondary | ICD-10-CM | POA: Diagnosis not present

## 2019-05-23 DIAGNOSIS — Z9861 Coronary angioplasty status: Secondary | ICD-10-CM | POA: Diagnosis not present

## 2019-05-23 DIAGNOSIS — I110 Hypertensive heart disease with heart failure: Secondary | ICD-10-CM | POA: Diagnosis not present

## 2019-05-23 DIAGNOSIS — I214 Non-ST elevation (NSTEMI) myocardial infarction: Secondary | ICD-10-CM | POA: Diagnosis not present

## 2019-05-23 DIAGNOSIS — Z7901 Long term (current) use of anticoagulants: Secondary | ICD-10-CM | POA: Diagnosis not present

## 2019-05-23 DIAGNOSIS — I5042 Chronic combined systolic (congestive) and diastolic (congestive) heart failure: Secondary | ICD-10-CM | POA: Diagnosis not present

## 2019-05-23 DIAGNOSIS — E7800 Pure hypercholesterolemia, unspecified: Secondary | ICD-10-CM | POA: Diagnosis not present

## 2019-05-23 DIAGNOSIS — Z87891 Personal history of nicotine dependence: Secondary | ICD-10-CM | POA: Diagnosis not present

## 2019-05-23 NOTE — Progress Notes (Signed)
Daily Session Note  Patient Details  Name: Martin Bush MRN: [id number] Date of Birth: 06/06/1955 Referring Provider:     Cardiac Rehab from 03/06/2019 in [facility] Cardiac and Pulmonary Rehab  Referring Provider  Ganji, Jay MD      Encounter Date: 05/23/2019  Check In: Session Check In - 05/23/19 1324      Check-In   Supervising physician immediately available to respond to emergencies  See telemetry face sheet for immediately available ER MD    Location  [facility]-Cardiac & Pulmonary Rehab    Staff Present  Susanne Bice, RN, BSN, CCRP;Meredith Craven, RN BSN;Jessica Hawkins, MA, RCEP, CCRP, CCET;[name] Caiola RDN, LDN    Virtual Visit  No    Medication changes reported      No    Fall or balance concerns reported     No    Warm-up and Cool-down  Performed on first and last piece of equipment    Resistance Training Performed  Yes    VAD Patient?  No    PAD/SET Patient?  No      Pain Assessment   Currently in Pain?  No/denies          Social History   Tobacco Use  Smoking Status Former Smoker  . Packs/day: 0.25  . Years: 27.00  . Pack years: 6.75  . Types: Cigars  . Quit date: 2002  . Years since quitting: 19.3  Smokeless Tobacco Former User  . Quit date: 2002    Goals Met:  Independence with exercise equipment Exercise tolerated well No report of cardiac concerns or symptoms  Goals Unmet:  Not Applicable  Comments: Pt able to follow exercise prescription today without complaint.  Will continue to monitor for progression.    Dr. Mark Miller is Medical Director for [facility] and [facility]. 

## 2019-05-24 ENCOUNTER — Ambulatory Visit: Payer: No Typology Code available for payment source | Admitting: Cardiology

## 2019-05-25 DIAGNOSIS — Z4502 Encounter for adjustment and management of automatic implantable cardiac defibrillator: Secondary | ICD-10-CM | POA: Diagnosis not present

## 2019-05-25 DIAGNOSIS — I5022 Chronic systolic (congestive) heart failure: Secondary | ICD-10-CM | POA: Diagnosis not present

## 2019-05-25 DIAGNOSIS — I5042 Chronic combined systolic (congestive) and diastolic (congestive) heart failure: Secondary | ICD-10-CM | POA: Diagnosis not present

## 2019-05-25 DIAGNOSIS — Z95810 Presence of automatic (implantable) cardiac defibrillator: Secondary | ICD-10-CM | POA: Diagnosis not present

## 2019-05-25 NOTE — Progress Notes (Signed)
Primary Physician/Referring:  [name], MD  Patient ID: Martin Bush, male    DOB: 1955-05-25, 64 y.o.   MRN: [id number]  Chief Complaint  Patient presents with  . Coronary Artery Disease  . Hyperlipidemia  . Follow-up    3 month   HPI:    Martin Bush  is a 64 y.o.  Caucasian male  with CAD and history of CABG x2 in 2002 with only LIMA to LAD being patent, Circumflex coronary artery is super dominant. H/O  Left main stenting in 2015 and in 2016 he had complex PCI to in-stent restenotic circumflex/OM bifurcation, due to recurrent restenosis, underwent stenting to left main and circumflex proximal and midsegment with implantation of 2 overlapping 3.5 x 20 and 3.0 x 20 mm  Synergy DES on 08/16/2017 when he presented with decompensated heart failure and recurrent VT.    Similar presentation on 01/03/2019, coronary angiography on 01/05/2019 revealing severely restenotic left main stent that extends into the circumflex proximal and also old mid circumflex stent and successful Wolverine 3.5 mm cutting balloon angioplasty on 01/11/19. He also had slow VT but stable hemodynamics at presentation. He is on chronic amiodarone.   He is feeling well and has not had any further dyspnea, palpitations or chest pain. Presents for a 3 month OV and is tolerating all his medications and anti platelet therapy without complications.    Past Medical History:  Diagnosis Date  . AICD (automatic cardioverter/defibrillator) present    Medtronic  . Arthritis    "thumbs"  (08/02/2017)  . CHF (congestive heart failure) ([place])   . Chronic combined systolic and diastolic heart failure ([place])    a. 08/2017 Echo: EF 20-25%, mod glob HK. Sev distal ant sept, inflat HK. Apical AK. Gr2 DD. Mildly reduced RV fxn.  . Colon polyps   . Coronary artery disease    a. s/p CABG x 2 (LIMA->LAD, VG->OM); b. Multiple PCI's to LM/LCX/OM; c. 07/2017 PTCA of LM/LCX w/ early ISR-->repeat PCI/DES to LM (3.5x20 Synergy DES) and LCX  (3.0x20 Synergy DES); d. 07/2018 Relook Cath: LM patent stent, LAD 100ost, LCX patent stent, OM1 99/60, LIMA->LAD ok. VG->dLCX 65 (old).  . Depression   . Encounter for assessment of implantable cardioverter-defibrillator (ICD) 09/27/2018  . High cholesterol   . Hypertension   . ICD; Biventricular  Medtronic ICD Amplia MRI QWuad CRT-D  in situ 10/29/14 10/29/2014   Remote ICD check 09.23.20:  One 6 beat NSVT. No therapy.  1 SVT episode @ 130 bpm (38 Sec).  There were 23 Vent sense episodes detected for up to 1.1 min/day (AT). Health trends (patient activity, heart rate variability, average heart rates) are stable.Trans-thoracic impedance trends and the OptiVol Fluid Index do no present significant abnormalities. Battery longevity is 4.3 years. RA pacing is 3  . Ischemic cardiomyopathy 09/27/2018  . MI (myocardial infarction) ([place]) 2003  . NSTEMI (non-ST elevated myocardial infarction) ([place]) 07/16/2014  . OSA on CPAP   . Oxygen deficiency   . Pneumonia 10/2014  . Proteinuria   . Sleep apnea   . Type II diabetes mellitus (HCC)    insulin dependent   Past Surgical History:  Procedure Laterality Date  . BIOPSY  09/19/2018   Procedure: BIOPSY;  Surgeon: Thornton Park, MD;  Location: [facility];  Service: Gastroenterology;;  . CARDIAC CATHETERIZATION N/A 07/16/2014   Procedure: Left Heart Cath and Coronary Angiography;  Surgeon: Adrian Prows, MD;  Location: [place] [facility];  Service: Cardiovascular;  Laterality: N/A;  .  CARDIAC CATHETERIZATION  07/16/2014   Procedure: Coronary Balloon Angioplasty;  Surgeon: Adrian Prows, MD;  Location: [place] [facility];  Service: Cardiovascular;;  . CARDIAC CATHETERIZATION  2003  . CARDIAC DEFIBRILLATOR PLACEMENT  2016  . CATARACT EXTRACTION W/ INTRAOCULAR LENS IMPLANT Left 03/2014  . COLONOSCOPY    . COLONOSCOPY WITH PROPOFOL N/A 09/19/2018   Procedure: COLONOSCOPY WITH PROPOFOL;  Surgeon: Thornton Park, MD;  Location: [facility];  Service:  Gastroenterology;  Laterality: N/A;  . CORONARY ANGIOPLASTY WITH STENT PLACEMENT     "I've got a total of 8 stents in there; mostly doine at [facility]" (08/02/2017)  . CORONARY ARTERY BYPASS GRAFT  ~ 2003   "CABG X2"; [facility]  . CORONARY ATHERECTOMY N/A 08/16/2017   Procedure: CORONARY ATHERECTOMY;  Surgeon: Nigel Mormon, MD;  Location: [place] [facility];  Service: Cardiovascular;  Laterality: N/A;  . CORONARY BALLOON ANGIOPLASTY N/A 08/04/2017   Procedure: CORONARY BALLOON ANGIOPLASTY;  Surgeon: Adrian Prows, MD;  Location: [place] [facility];  Service: Cardiovascular;  Laterality: N/A;  . CORONARY BALLOON ANGIOPLASTY N/A 01/11/2019   Procedure: CORONARY BALLOON ANGIOPLASTY;  Surgeon: Adrian Prows, MD;  Location: [place] [facility];  Service: Cardiovascular;  Laterality: N/A;  . CORONARY STENT INTERVENTION N/A 08/16/2017   Procedure: CORONARY STENT INTERVENTION;  Surgeon: Nigel Mormon, MD;  Location: [place] [facility];  Service: Cardiovascular;  Laterality: N/A;  . ELBOW SURGERY Left ?2001   "pinched nerve"  . ENDOSCOPIC MUCOSAL RESECTION  12/18/2018   Procedure: ENDOSCOPIC MUCOSAL RESECTION;  Surgeon: Rush Landmark Telford Nab., MD;  Location: [place];  Service: Gastroenterology;;  . Otho Darner SIGMOIDOSCOPY N/A 12/18/2018   Procedure: Beryle Quant;  Surgeon: Irving Copas., MD;  Location: [employer];  Service: Gastroenterology;  Laterality: N/A;  . HEMOSTASIS CLIP PLACEMENT  12/18/2018   Procedure: HEMOSTASIS CLIP PLACEMENT;  Surgeon: Irving Copas., MD;  Location: [place];  Service: Gastroenterology;;  . LEFT HEART CATH AND CORONARY ANGIOGRAPHY N/A 01/11/2019   Procedure: LEFT HEART CATH AND CORONARY ANGIOGRAPHY;  Surgeon: Adrian Prows, MD;  Location: [place] [facility];  Service: Cardiovascular;  Laterality: N/A;  . LEFT HEART CATH AND CORS/GRAFTS ANGIOGRAPHY N/A 08/04/2017   Procedure: LEFT HEART CATH AND CORS/GRAFTS ANGIOGRAPHY;   Surgeon: Adrian Prows, MD;  Location: [place] [facility];  Service: Cardiovascular;  Laterality: N/A;  . LEFT HEART CATH AND CORS/GRAFTS ANGIOGRAPHY N/A 08/20/2017   Procedure: LEFT HEART CATH AND CORS/GRAFTS ANGIOGRAPHY;  Surgeon: Nigel Mormon, MD;  Location: [place] [facility];  Service: Cardiovascular;  Laterality: N/A;  . LEFT HEART CATH AND CORS/GRAFTS ANGIOGRAPHY N/A 01/08/2019   Procedure: LEFT HEART CATH AND CORS/GRAFTS ANGIOGRAPHY;  Surgeon: Wellington Hampshire, MD;  Location: [place] [facility];  Service: Cardiovascular;  Laterality: N/A;  . POLYPECTOMY  09/19/2018   Procedure: POLYPECTOMY;  Surgeon: Thornton Park, MD;  [facility];  Service: Gastroenterology;;  . Lia Foyer INJECTION  09/19/2018   Procedure: SUBMUCOSAL INJECTION;  Surgeon: Thornton Park, MD;  [facility]: [facility];  Service: Gastroenterology;;  . Lia Foyer LIFTING INJECTION  12/18/2018   Procedure: SUBMUCOSAL LIFTING INJECTION;  Surgeon: Irving Copas., MD;  Location: [facility];  Service: Gastroenterology;;    Family History  Problem Relation Age of Onset  . Uterine cancer Mother   . Lung cancer Mother   . Hyperlipidemia Father   . Heart disease Father   . Hypertension Father   . Diabetes Father   . Colon cancer Neg Hx   .  Esophageal cancer Neg Hx   . Colon polyps Neg Hx   . Rectal cancer Neg Hx   . Stomach cancer Neg Hx   . Inflammatory bowel disease Neg Hx   . Liver disease Neg Hx   . Pancreatic cancer Neg Hx    Social History   Tobacco Use  . Smoking status: Former Smoker    Packs/day: 0.25    Years: 27.00    Pack years: 6.75    Types: Cigars    Quit date: 2002    Years since quitting: 19.3  . Smokeless tobacco: Former Systems developer    Quit date: 2002  Substance Use Topics  . Alcohol use: Not Currently    Marital Status: Married ROS  Review of Systems  Cardiovascular: Positive for dyspnea on exertion. Negative for chest pain and leg swelling.  Gastrointestinal:  Negative for melena.   Objective  Blood pressure 124/72, pulse 84, temperature 97.9 F (36.6 C), temperature source Temporal, resp. rate 16, height 5\' 7"  (1.702 m), weight 219 lb 6.4 oz (99.5 kg), SpO2 95 %.  Vitals with BMI 05/28/2019 04/16/2019 03/27/2019  Height 5\' 7"  5\' 7"  5\' 7"   Weight 219 lbs 6 oz 221 lbs 8 oz 219 lbs  BMI 34.35 [id number]  Systolic [id number]  Diastolic 72 73 94  Pulse 84 84 81     Physical Exam  Constitutional: Vital signs are normal. He does not appear ill.  He is moderately built and mildly obese in no acute distress.  Cardiovascular: Normal rate, regular rhythm, normal heart sounds and intact distal pulses.  Pulses:      Femoral pulses are 2+ on the right side and 2+ on the left side.      Popliteal pulses are 1+ on the right side and 1+ on the left side.       Dorsalis pedis pulses are 0 on the right side and 0 on the left side.       Posterior tibial pulses are 0 on the right side and 0 on the left side.  No JVD.  No pedal edema.  Pulmonary/Chest: Effort normal and breath sounds normal. No accessory muscle usage. No respiratory distress.  Abdominal: Soft. Bowel sounds are normal. A hernia is present. Hernia confirmed positive in the ventral area (reducible).  obese  Vitals reviewed.  Laboratory examination:   Recent Labs    01/09/19 0546 01/11/19 0604 04/18/19 1019  NA 136 134* 135  K 4.7 4.7 4.7  CL 104 103 98  CO2 23 23 21   GLUCOSE 125* 120* 473*  BUN 32* 29* 31*  CREATININE 1.61* 1.49* 1.65*  CALCIUM 9.5 8.8* 9.5  GFRNONAA 45* 49* 44*  GFRAA 52* 57* 50*   CrCl cannot be calculated (Patient's most recent lab result is older than the maximum 21 days allowed.).  CMP Latest Ref Rng & Units 04/18/2019 01/11/2019 01/09/2019  Glucose 65 - 99 mg/dL 473(H) 120(H) 125(H)  BUN 8 - 27 mg/dL 31(H) 29(H) 32(H)  Creatinine 0.76 - 1.27 mg/dL 1.65(H) 1.49(H) 1.61(H)  Sodium 134 - 144 mmol/L 135 134(L) 136  Potassium 3.5 - 5.2 mmol/L 4.7 4.7 4.7    Chloride 96 - 106 mmol/L 98 103 104  CO2 20 - 29 mmol/L 21 23 23   Calcium 8.6 - 10.2 mg/dL 9.5 8.8(L) 9.5  Total Protein 6.0 - 8.5 g/dL 6.6 - -  Total Bilirubin 0.0 - 1.2 mg/dL 0.3 - -  Alkaline Phos 39 - 117 IU/L 116 - -  AST 0 - 40 IU/L 12 - -  ALT 0 - 44 IU/L 13 - -   CBC Latest Ref Rng & Units 01/11/2019 01/08/2019 01/08/2019  WBC 4.0 - 10.5 K/uL 7.9 7.5 8.0  Hemoglobin 13.0 - 17.0 g/dL 11.3(L) 12.6(L) 12.6(L)  Hematocrit 39.0 - 52.0 % 33.2(L) 37.9(L) 37.4(L)  Platelets 150 - 400 K/uL 181 198 200   Lipid Panel     Component Value Date/Time   CHOL 127 04/18/2019 1019   TRIG 181 (H) 04/18/2019 1019   HDL 39 (L) 04/18/2019 1019   CHOLHDL 3.2 01/04/2019 0640   VLDL 30 01/04/2019 0640   LDLCALC 58 04/18/2019 1019   LDLDIRECT 71.0 10/30/2018 1030   HEMOGLOBIN A1C Lab Results  Component Value Date   HGBA1C 12.1 (H) 04/18/2019   MPG 263 07/12/2015   TSH Recent Labs    04/18/19 1019  TSH 1.460   BNP    Component Value Date/Time   BNP 820.8 (H) 04/18/2019 1019   BNP 1,262.0 (H) 01/03/2019 2133    Medications and allergies   Allergies  Allergen Reactions  . Diltiazem Rash     Current Outpatient Medications  Medication Instructions  . acetaminophen (TYLENOL) 650 mg, Oral, Every 4 hours PRN  . amiodarone (PACERONE) 100 mg, Oral, Daily  . aspirin 81 mg, Oral, Daily  . carvedilol (COREG) 6.25 mg, Oral, 2 times daily with meals  . Cholecalciferol (VITAMIN D3) 50 MCG (2000 UT) TABS Oral, 2 times daily  . empagliflozin (JARDIANCE) 25 mg, Oral, Daily  . ezetimibe (ZETIA) 10 mg, Oral, Daily at bedtime  . Fish Oil 1,000 mg, Oral, 2 times daily  . furosemide (LASIX) 40 mg, Oral, Daily, Currently taking ~1/week  . insulin aspart protamine- aspart (NOVOLOG MIX 70/30) (70-30) 100 UNIT/ML injection 25 Units, Subcutaneous, 2 times daily with meals  . isosorbide mononitrate (IMDUR) 30 mg, Oral, Daily  . liraglutide (VICTOZA) 1.8 mg, Subcutaneous, Daily  . metFORMIN  (GLUCOPHAGE) 1,000 mg, Oral, 2 times daily with meals, Taking 2 tabs twice a day  . nitroGLYCERIN (NITROSTAT) 0.4 mg, Sublingual, Every 5 min x3 PRN  . potassium chloride SA (KLOR-CON) 20 MEQ tablet TAKE 1 TABLET BY MOUTH TWICE A DAY  . PRESCRIPTION MEDICATION Inhalation, Daily at bedtime, CPAP  . rosuvastatin (CRESTOR) 40 mg, Oral, Daily at bedtime  . sacubitril-valsartan (ENTRESTO) 97-103 MG 1 tablet, Oral, 2 times daily  . sertraline (ZOLOFT) 50 mg, Oral, Daily at bedtime  . ticagrelor (BRILINTA) 90 mg, Oral, 2 times daily   Radiology:  No results found.  Cardiac Studies:   Renal artery duplex 09/02/2017: Right: Abnormal right Resistive Index. No evidence of right renal  artery stenosis. Left: Normal left Resistive Index. No evidence of left renal artery stenosis. Mesenteric: Normal Celiac artery findings. 70 to 99% stenosis in the superior mesenteric artery.  Echocardiogram 01/04/2019:  1. Left ventricular ejection fraction, by visual estimation, is 25 to 30%. The left ventricle has severely decreased function. There is no left ventricular hypertrophy. Definity contrast agent was given IV to delineate the left ventricular endocardial borders.  Left ventricular diastolic parameters are consistent with Grade II diastolic dysfunction (pseudonormalization).  Mildly dilated left ventricular internal cavity size. The left ventricle demonstrates global hypokinesis. 2. RV not well visualized.  3. Left atrial size was mildly dilated.  Left Heart Catheterization12/14/2020:  1. Severe underlying three-vessel coronary artery disease with patent LIMA to LAD. Chronically occluded SVG to OM. Severe in-stent restenosis in left main stent extending into the proximal  left circumflex. In addition, there is significant restenosis in the stent placed in the left posterior AV groove artery. The RCA is known to be small in size and severely diseased. 2. Left ventricular angiography was not performed due  to chronic kidney disease. LVEDP was 13 mmHg.  Coronary angioplasty of the left main and mid circumflex coronary artery 01/11/2019: Successful Wolverine cutting balloon angioplasty of the left main, 3.5 x 10 mm balloon utilized and high-pressure 12 atmospheric pressure inflations performed throughout the in-stent restenotic left main and proximal ostial circumflex and also mid circumflex coronary artery, 99% reduced to 0% with TIMI II to TIMI-3 flow. 60 mill contrast utilized.  Scheduled Remote ICD check 04/24/2019:  No VT/VF episodes. No therapy. Ventricular sensing episodes detected, no EGMs available. AV rates suggest SVT vs sinus tachycardia lasting up to 5 minutes and 24 seconds in duration. Health trends (patient activity, heart rate variability, average heart rates) are stable. Trans-thoracic impedance trends and the Optivol Fluid Index do no present significant abnormalities. Battery longevity is 3.3 years. RA pacing is 40.6 %, RV pacing is 98.9 %, and LV pacing is 98.9 %.   EKG:  01/16/2019: AV paced rhythm at rate of 77 bpm, no further analysis.    Assessment     ICD-10-CM   1. Ischemic cardiomyopathy  I25.5 amiodarone (PACERONE) 200 MG tablet  2. Chronic combined systolic and diastolic heart failure (HCC)  I50.42 PCV ECHOCARDIOGRAM COMPLETE  3. Coronary artery disease involving native coronary artery of native heart without angina pectoris  I25.10 PCV ECHOCARDIOGRAM COMPLETE  4. Essential hypertension  I10   5. High risk medication use  Z79.899   6. ICD; Biventricular  Medtronic ICD Amplia MRI QWuad CRT-D  in situ 10/29/14  Z95.810     Meds ordered this encounter  Medications  . amiodarone (PACERONE) 200 MG tablet    Sig: Take 0.5 tablets (100 mg total) by mouth daily.   Medications Discontinued During This Encounter  Medication Reason  . amiodarone (PACERONE) 400 MG tablet     Recommendations:   Martin Bush  is a 64 y.o. Caucasian male with CAD and history of CABG x2  in 2002 with only LIMA to LAD being patent, Circumflex coronary artery is super dominant. H/O  Left main stenting in 2015 and in 2016 when he had complex PCI to in-stent restenotic circumflex/OM bifurcation, due to recurrent restenosis, underwent stenting to left main and circumflex proximal and midsegment with implantation of 2 overlapping 3.5 x 20 and 3.0 x 20 mm  Synergy DES on 08/16/2017 when he presented with decompensated heart failure and recurrent VT. Similar presentation on 01/03/2019, coronary angiography on 01/04/2018 revealing severely restenotic left main stent that extends into the circumflex proximal and also old mid circumflex stent and successful Wolverine 3.5 mm cutting balloon angioplasty on 01/11/19.   Past medical history significant for uncontrolled diabetes mellitus, hypertension, chronic systolic and diastolic heart failure, stage III chronic kidney disease, obstructive sleep apnea on CPAP.  This is the month office visit, presently doing well and is trying his best to maintain regular diet.  No further leg edema, no PND orthopnea.  I would like to repeat his echocardiogram to reevaluate his LV systolic function as it has been close to 6 months.  He has developed mild depression and also loss of temper per his wife, could increase Zoloft to 100 mg daily.  But advised him to discuss with his PCP.   I also reviewed his ICD data, normal function  and also no e/o CHF. Continue dual antiplatelet therapy with aspirin and Brilinta, I will consider discontinuing this on his next office visit and continue aspirin alone.   I reviewed his labs, lipids in excellent control, blood pressure is well controlled, his only medical issue has been uncontrolled diabetes mellitus.  Adrian Prows, MD, [facility] 05/28/2019, 11:59 AM [facility] Pager: [phone] Office: [phone]

## 2019-05-28 ENCOUNTER — Encounter: Payer: Self-pay | Admitting: Cardiology

## 2019-05-28 ENCOUNTER — Other Ambulatory Visit: Payer: Self-pay

## 2019-05-28 ENCOUNTER — Encounter: Payer: Medicare Other | Attending: Cardiology | Admitting: *Deleted

## 2019-05-28 ENCOUNTER — Ambulatory Visit: Payer: No Typology Code available for payment source | Admitting: Cardiology

## 2019-05-28 VITALS — BP 124/72 | HR 84 | Temp 97.9°F | Resp 16 | Ht 67.0 in | Wt 219.4 lb

## 2019-05-28 DIAGNOSIS — Z79899 Other long term (current) drug therapy: Secondary | ICD-10-CM

## 2019-05-28 DIAGNOSIS — I5042 Chronic combined systolic (congestive) and diastolic (congestive) heart failure: Secondary | ICD-10-CM | POA: Diagnosis not present

## 2019-05-28 DIAGNOSIS — Z95810 Presence of automatic (implantable) cardiac defibrillator: Secondary | ICD-10-CM

## 2019-05-28 DIAGNOSIS — E1122 Type 2 diabetes mellitus with diabetic chronic kidney disease: Secondary | ICD-10-CM

## 2019-05-28 DIAGNOSIS — I2510 Atherosclerotic heart disease of native coronary artery without angina pectoris: Secondary | ICD-10-CM

## 2019-05-28 DIAGNOSIS — I252 Old myocardial infarction: Secondary | ICD-10-CM | POA: Insufficient documentation

## 2019-05-28 DIAGNOSIS — I214 Non-ST elevation (NSTEMI) myocardial infarction: Secondary | ICD-10-CM

## 2019-05-28 DIAGNOSIS — I255 Ischemic cardiomyopathy: Secondary | ICD-10-CM

## 2019-05-28 DIAGNOSIS — E1165 Type 2 diabetes mellitus with hyperglycemia: Secondary | ICD-10-CM

## 2019-05-28 DIAGNOSIS — Z9861 Coronary angioplasty status: Secondary | ICD-10-CM | POA: Diagnosis not present

## 2019-05-28 DIAGNOSIS — I10 Essential (primary) hypertension: Secondary | ICD-10-CM | POA: Diagnosis not present

## 2019-05-28 MED ORDER — AMIODARONE HCL 200 MG PO TABS: 100.0000 mg | ORAL_TABLET | Freq: Every day | ORAL | Status: DC

## 2019-05-28 NOTE — Progress Notes (Signed)
Daily Session Note  Patient Details  Name: Martin Bush MRN: [id number] Date of Birth: Dec 15, 1955 Referring Provider:     Cardiac Rehab from 03/06/2019 in [facility] Cardiac and Pulmonary Rehab  Referring Provider  Adrian Prows MD      Encounter Date: 05/28/2019  Check In: Session Check In - 05/28/19 1510      Check-In   Supervising physician immediately available to respond to emergencies  See telemetry face sheet for immediately available ER MD    Location  [facility]-Cardiac & Pulmonary Rehab    Staff Present  Renita Papa, RN Moises Blood, BS, ACSM CEP, Exercise Physiologist;Joseph Tessie Fass RCP,RRT,BSRT    Virtual Visit  No    Medication changes reported      Yes    Comments  cut amio in 1/2    Fall or balance concerns reported     No    Warm-up and Cool-down  Performed on first and last piece of equipment    Resistance Training Performed  Yes    VAD Patient?  No    PAD/SET Patient?  No      Pain Assessment   Currently in Pain?  No/denies          Social History   Tobacco Use  Smoking Status Former Smoker  . Packs/day: 0.25  . Years: 27.00  . Pack years: 6.75  . Types: Cigars  . Quit date: 2002  . Years since quitting: 19.3  Smokeless Tobacco Former Systems developer  . Quit date: 2002    Goals Met:  Independence with exercise equipment Exercise tolerated well No report of cardiac concerns or symptoms Strength training completed today  Goals Unmet:  Not Applicable  Comments: Pt able to follow exercise prescription today without complaint.  Will continue to monitor for progression.    Dr. Emily Filbert is Medical Director for [facility].

## 2019-05-30 ENCOUNTER — Other Ambulatory Visit: Payer: Self-pay

## 2019-05-30 ENCOUNTER — Encounter: Payer: Medicare Other | Admitting: *Deleted

## 2019-05-30 DIAGNOSIS — Z9861 Coronary angioplasty status: Secondary | ICD-10-CM

## 2019-05-30 DIAGNOSIS — I252 Old myocardial infarction: Secondary | ICD-10-CM | POA: Diagnosis not present

## 2019-05-30 DIAGNOSIS — I214 Non-ST elevation (NSTEMI) myocardial infarction: Secondary | ICD-10-CM

## 2019-05-30 NOTE — Progress Notes (Signed)
Daily Session Note  Patient Details  Name: Martin Bush MRN: [id number] Date of Birth: 03/29/1955 Referring Provider:     Cardiac Rehab from 03/06/2019 in [facility] Cardiac and Pulmonary Rehab  Referring Provider  Ganji, Jay MD      Encounter Date: 05/30/2019  Check In: Session Check In - 05/30/19 1514      Check-In   Supervising physician immediately available to respond to emergencies  See telemetry face sheet for immediately available ER MD    Location  [facility]    Staff Present  Meredith Craven, RN BSN;[name] Caiola RDN, LDN;Amanda Sommer, BA, ACSM CEP, Exercise Physiologist    Virtual Visit  No    Medication changes reported      No    Fall or balance concerns reported     No    Warm-up and Cool-down  Performed on first and last piece of equipment    Resistance Training Performed  Yes    VAD Patient?  No    PAD/SET Patient?  No      Pain Assessment   Currently in Pain?  No/denies          Social History   Tobacco Use  Smoking Status Former Smoker  . Packs/day: 0.25  . Years: 27.00  . Pack years: 6.75  . Types: Cigars  . Quit date: 2002  . Years since quitting: 19.3  Smokeless Tobacco Former User  . Quit date: 2002    Goals Met:  Independence with exercise equipment Exercise tolerated well No report of cardiac concerns or symptoms Strength training completed today  Goals Unmet:  Not Applicable  Comments: Pt able to follow exercise prescription today without complaint.  Will continue to monitor for progression.    Dr. Mark Miller is Medical Director for [facility] and [facility]. 

## 2019-05-31 ENCOUNTER — Other Ambulatory Visit: Payer: Self-pay

## 2019-05-31 ENCOUNTER — Encounter: Payer: Medicare Other | Admitting: *Deleted

## 2019-05-31 DIAGNOSIS — Z9861 Coronary angioplasty status: Secondary | ICD-10-CM | POA: Diagnosis not present

## 2019-05-31 DIAGNOSIS — I214 Non-ST elevation (NSTEMI) myocardial infarction: Secondary | ICD-10-CM

## 2019-05-31 DIAGNOSIS — I252 Old myocardial infarction: Secondary | ICD-10-CM | POA: Diagnosis not present

## 2019-05-31 NOTE — Progress Notes (Signed)
Daily Session Note  Patient Details  Name: Martin Bush MRN: [id number] Date of Birth: 08/25/1955 Referring Provider:     Cardiac Rehab from 03/06/2019 in [facility] Cardiac and Pulmonary Rehab  Referring Provider  Adrian Prows MD      Encounter Date: 05/31/2019  Check In: Session Check In - 05/31/19 1501      Check-In   Supervising physician immediately available to respond to emergencies  See telemetry face sheet for immediately available ER MD    Location  [facility]Cardiac [facility] Pulmonary Rehab    Staff Present  Renita Papa, RN BSN;Joseph [address] [place], [name], [place], CCRP, CCET    Virtual Visit  No    Medication changes reported      No    Fall or balance concerns reported     No    Warm-up and Cool-down  Performed on first and last piece of equipment    Resistance Training Performed  Yes    VAD Patient?  No    PAD/SET Patient?  No      Pain Assessment   Currently in Pain?  No/denies          Social History   Tobacco Use  Smoking Status Former Smoker  . Packs/day: 0.25  . Years: 27.00  . Pack years: 6.75  . Types: Cigars  . Quit date: 2002  . Years since quitting: 19.3  Smokeless Tobacco Former Systems developer  . Quit date: 2002    Goals Met:  Independence with exercise equipment Exercise tolerated well No report of cardiac concerns or symptoms Strength training completed today  Goals Unmet:  Not Applicable  Comments: Pt able to follow exercise prescription today without complaint.  Will continue to monitor for progression.    Dr. Emily Filbert is Medical Director for [place] and [facility].

## 2019-06-04 ENCOUNTER — Other Ambulatory Visit: Payer: Self-pay

## 2019-06-04 ENCOUNTER — Encounter: Payer: Medicare Other | Admitting: *Deleted

## 2019-06-04 DIAGNOSIS — I252 Old myocardial infarction: Secondary | ICD-10-CM | POA: Diagnosis not present

## 2019-06-04 DIAGNOSIS — Z9861 Coronary angioplasty status: Secondary | ICD-10-CM

## 2019-06-04 DIAGNOSIS — I214 Non-ST elevation (NSTEMI) myocardial infarction: Secondary | ICD-10-CM

## 2019-06-04 NOTE — Progress Notes (Signed)
Daily Session Note  Patient Details  Name: Martin Bush MRN: [id number] Date of Birth: [birth year] Referring Provider:     Cardiac Rehab from 03/06/2019 in [facility] Cardiac and Pulmonary Rehab  Referring Provider  Adrian Prows MD      Encounter Date: 06/04/2019  Check In: Session Check In - 06/04/19 1514      Check-In   Supervising physician immediately available to respond to emergencies  See telemetry face sheet for immediately available ER MD    Location  [facility]Cardiac [facility] Pul[facility] Re[facility]    Staff Present  Renita Papa, RN Moises Blood, BS, ACSM CEP, Exercise Physiologist;Joseph Tessie Fass RCP,RRT,BSRT    Virtual Visit  No    Medication changes reported      No    Fall or balance concerns reported     No    Warm-up and Cool-down  Performed on first and last piece of equipment    Resistance Training Performed  No      Pain Assessment   Currently in Pain?  No/denies          Social History   Tobacco Use  Smoking Status Former Smoker  . Packs/day: 0.25  . Years: 27.00  . Pack years: 6.75  . Types: Cigars  . Quit date: 2002  . Years since quitting: 19.3  Smokeless Tobacco Former Systems developer  . Quit date: 2002    Goals Met:  Independence with exercise equipment Exercise tolerated well No report of cardiac concerns or symptoms Strength training completed today  Goals Unmet:  Not Applicable  Comments: Pt able to follow exercise prescription today without complaint.  Will continue to monitor for progression.    Dr. Emily Filbert is Medical Director for [place] and [facility].

## 2019-06-05 ENCOUNTER — Encounter: Payer: Self-pay | Admitting: *Deleted

## 2019-06-05 ENCOUNTER — Other Ambulatory Visit: Payer: Self-pay | Admitting: *Deleted

## 2019-06-05 NOTE — Patient Outreach (Addendum)
Martin Bush) Care Management [facility] CM Telephone Outreach Post-hospital discharge day # 145 without unplanned hospital re-admission  06/05/2019  Martin Bush 04/15/1955 [id number]  Successful telephone outreach to Martin Bush, 64 y/o male referred 01/12/2019 to [place] by [facility] Liaison after recent hospitalization December 9- 17, 2020 for acute pulmonary edema/ NSTEMI with native vessel disease and stent re-stenosis; patient had cardiac catherization 01/08/2019 and eventual PCI/ angioplasty.Patient was discharged home to self care withouthome health services in place. 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  HIPAA/ identity verified with patient; patient reports "doing fine," and he denies pain, new/ recent falls, clinical concerns; reports he continues driving self to appointments/ errands, and he sounds in no distress throughout call today.    Patient further reports:  -- no medicationconcerns; reports continues to self-manage medications and takes all medications as prescribed; continues taking diuretic as needed; reports took dose yesterday; reports taking 2-3 times per week as needed for weight gain/ swelling ---- able to accurately/ independently verbalize new dosing instructions for amiodarone post- recent cardiology provider office visit ---- continues able to independently verbalize insulin dosing; denies changes to insulin, continues to self-manage  -- verbalizes accurate understanding of upcoming provider appointments and plans to attend all- continues driving self ---- reviewed with patient recent cardiology office visit May 28, 2019: verbalizes accurate understanding of post-office visit instructions ---- states he "knows" he has an endocrinology provider office visit scheduled "sometime" in June; states unable to check his calendar;  also states that he did receive a call from the endocrinology provider office team after our last conversation/ care coordination outreach (05/18/19), but states that he lost the phone number to the provider office and has not yet called them back  [place] [facility] Coordination via 3-way call placed, with patient on line: 10:15 am:  [name] endocrinology provider [facility]) [phone]); spoke with Martin Bush, who confirms that patient's next scheduled appointment is June 17 at 09:45; she also confirms that she will ask Martin Bush nurse to contact patient to follow up on CGM [facility]) status, as requested at time of 05/18/19 care coordination outreach  Patient reports that he does not have his calendar with him and requests that I text message him the contact information for endocrinology provider and upcoming scheduled appointment date- this was completed  -- continues to attend cardiac rehabilitation; tolerating well; discussed value of exercise/ activity in setting of DM and CAD/ CHF/ COPD; encouraged patient to consider participating in wellness benefits through insurance/ community resource options; he states he "might" do ---- continues using home O2 and CPAP; tolerating well ---- continues able to independently verbalize signs/ symptoms yellow CHF zone; denies all today ---- reports continues completing daily weights and reports weight ranges between 210-213 lbs; continues using digital smart scale that sends weight directly to cardiology office; continues able to independently verbalize accurate understanding of weight gain guidelines in setting of CHF  -- continues monitoring blood sugars at home "several times per week;" does not record; states "I just look at the monitor;" confirms that he has not initiated food journaling; cannot tell me what specific blood sugar values are for fasting vs. post-prandial numbers; he randomly reads  values of "94 and 158" for fasting and "222/ 188/ 241/ 226" stating he "thinks" these are post-prandial values.  Again reiterated value  of purposefully recording on paper fasting and post-prandial values and discussed rationale of same with patient ---- again reviewed with patient A1-C trends over last year along with correlation to blood sugar averages over time; patient reports he "might" have gotten educational material mailed to him at time of last outreach, but admits he has "not read it yet;" encouraged patient to consider reading, explaining that he will have more control if he will try the simple strategies we have been discussing; patient admits he is not very motivated ---- again discussed potential long-term complications of consistently high blood sugars; will re-place printed educational material in mail to patient as he reports he can't remember if he received previously mailed items- encouraged patient to promptly review  Patient denies further issues, concerns, or problems today. Iconfirmed that patient hasmy direct phone number, the main [facility] CM office phone number, and the [facility] CM 24-hour nurse advice phone number should issues arise prior to next scheduled [facility] CM outreachnext month. Encouraged patient to contact me directly if needs, questions, issues, or concerns arise prior to next scheduled outreach; patient agreed to do so.  Plan:  Patient will take medications as prescribed and will attend all scheduled provider appointments  Patient will promptly notify care providers for any new concerns/ issues/ problems that arise  Patient willcontinuemonitoring/ recording daily weights at home and will follow weight gain guidelines and corresponding action plan for weight gain  Patient will continue following heart healthy and diabetic diet and will consider food journaling  Patient will continue monitoring and begin recording blood sugars at homeevery day at least one time  per day  I will send patient printed educational material around self-health management of DM for his review  [place] outreach to continue with scheduled phone callnext month, unless indicated sooner  [facility] CM Care Plan Problem One     Most Recent Value  Care Plan Problem One  Self- health management of chronic disease state of DM as evidenced by patient reporting and ongoing elevated A1-C levels over time  Role Documenting the Problem One  Care Management Coordinator  Care Plan for Problem One  Active  [facility] Long Term Goal   Over the next 60 days, patient will monitor and record daily blood sugars as evidenced by review of same with patient during [facility] RN CM outreach  [facility] Long Term Goal Start Date  06/05/19  Interventions for Problem One Long Term Goal  Discussed with patient his current blood sugar monitoring habits at home,  encouraged him to consistently monitor and record blood sugars daily on paper,  discussed optimal times to monitor blood sugars at home, and value of recording fasting and post-prandial blood sugars on paper,  discussed value of keeping food journal for self-management empowerment and enocuraged patient to consider beginning this helpful practice,  reviewed current medications for DM with patient and confirmed that he is able to accurately verbalize current medications and reports taking as prescribed,  confirmed that patient received previously printed education around A1-C levels and has not yet read- encouraged him to do so soon,  placed new printed educational material in mail to patient around long term complications associated with ongoing high blood sugar levels/ uncontrolled DM  [facility] CM Short Term Goal #1   Over the next 30 days, patient will follow up with endocrinology provider around possibility of obtaining CGM as evidenced by patient reporting and collaboration with endocrinology provider as indicated during [facility] RN CM outreach  [facility] CM Short  Term Goal #1 Start  Date  06/05/19  Interventions for Short Term Goal #1  Confirmed that patient did not follow up with endocrinology provider after last care coordination outreach on 05/18/19,  placed second care coordination outreach to provider via 3-way call and encouraged patient to engage with endocrinology provider once he receives return call,  sent patient text message with endocrinology provider contact information for him to add to his phone contact list,  confirmed with endocrinology team patient's next appointment and sent text message to patient confirming time and date of scheduled appointment,  changed [facility] CM program from heart failure to Diabetes    [facility] CM Care Plan Problem Two     Most Recent Value  Care Plan Problem Two  Ongoing reinforcement of self-health management of chronic disease states of CHF, as evidenced by patient reporting  [Goal modified/ extended,  program changed from HF to DM]  Role Documenting the Problem Two  Care Management Coordinator  Care Plan for Problem Two  Active  Interventions for Problem Two Long Term Goal   Confirmed that patient continues monitoring and recording daily weights at home using digital scale that transfers to cardiology provider office,  reviewed recent weights at home with patient and confirmed that he continues taking diuretic as needed,  reviewed with patient recent cardiology office visit and confirmed that he has contact information for cardiology provider,  confirmed that patient is able to accurately verbalize changes made to medications at time of office visit,  reviewed use of home O2/ CPAP q HS with patient and confirmed that he has no concerns around either and is using as prescribed  [facility] Long Term Goal  Over the next 60 days, patient will continue monitoring and recording daily weights at home, as evidenced by patient reporting and review of same during [facility] RN CM outreach [goal modified today]  [facility] Long Term Goal Start Date  06/05/2019- goal extended  today  [facility] CM Short Term Goal #1   Over the next 30 days, patient will schedule next appointment (due in June) with endocrinology provider, as evidenced by patient reporting and collaboration with endocrinology provider as indicated during [facility] RN CM outreach  [facility] CM Short Term Goal #1 Start Date  05/18/19  [facility] CM Short Term Goal #1 Met Date   06/05/19 [Goal met]  Interventions for Short Term Goal #2   Placed care coordination outreach to patient's endocrinology office to confirm next appointment,  sent appointment details to patient via text message per his request and encouraged patient to add his calendar and plan to attend appointment as scheduled  [facility] CM Short Term Goal #2   Over the next 30 days, patient will continue attending cardiac rehabilitation sessions, as evidenced by review of EHR and reporting of patient during [facility] RN CM outreach  [facility] CM Short Term Goal #2 Start Date  06/05/19  Interventions for Short Term Goal #2  Confirmed that patient continues attending cardiac rehabilitation sessions and is tolerating well,  discussed insurance benefits/ community resource options available to patient for wellness,  discussed value of continuing regular structured exercise program once cardiac rehabilitation sessions are completed     Oneta Rack, RN, BSN, [employer] Coordinator [facility] Care Management  [phone]

## 2019-06-06 ENCOUNTER — Other Ambulatory Visit: Payer: Self-pay

## 2019-06-06 ENCOUNTER — Encounter: Payer: Medicare Other | Admitting: *Deleted

## 2019-06-06 DIAGNOSIS — I214 Non-ST elevation (NSTEMI) myocardial infarction: Secondary | ICD-10-CM

## 2019-06-06 DIAGNOSIS — Z9861 Coronary angioplasty status: Secondary | ICD-10-CM | POA: Diagnosis not present

## 2019-06-06 DIAGNOSIS — I252 Old myocardial infarction: Secondary | ICD-10-CM | POA: Diagnosis not present

## 2019-06-06 NOTE — Progress Notes (Signed)
Daily Session Note  Patient Details  Name: Martin Bush MRN: [id number] Date of Birth: 11/24/1955 Referring Provider:     Cardiac Rehab from 03/06/2019 in [facility] Cardiac and Pulmonary Rehab  Referring Provider  Adrian Prows MD      Encounter Date: 06/06/2019  Check In: Session Check In - 06/06/19 1510      Check-In   Supervising physician immediately available to respond to emergencies  See telemetry face sheet for immediately available ER MD    Location  [facility]-Cardiac & Pulmonary Rehab    Staff Present  Renita Papa, RN Vickki Hearing, BA, ACSM CEP, Exercise Physiologist;Jessica Luan Pulling, MA, RCEP, CCRP, CCET;[name] Caiola RDN, LDN    Virtual Visit  No    Medication changes reported      No    Warm-up and Cool-down  Performed on first and last piece of equipment    Resistance Training Performed  Yes    VAD Patient?  No    PAD/SET Patient?  No      Pain Assessment   Currently in Pain?  No/denies          Social History   Tobacco Use  Smoking Status Former Smoker  . Packs/day: 0.25  . Years: 27.00  . Pack years: 6.75  . Types: Cigars  . Quit date: 2002  . Years since quitting: 19.3  Smokeless Tobacco Former Systems developer  . Quit date: 2002    Goals Met:  Independence with exercise equipment Exercise tolerated well No report of cardiac concerns or symptoms Strength training completed today  Goals Unmet:  Not Applicable  Comments: Pt able to follow exercise prescription today without complaint.  Will continue to monitor for progression.    Dr. Emily Filbert is Medical Director for [place] and [facility].

## 2019-06-07 ENCOUNTER — Encounter: Payer: Medicare Other | Admitting: *Deleted

## 2019-06-07 ENCOUNTER — Other Ambulatory Visit: Payer: Self-pay

## 2019-06-07 DIAGNOSIS — I5043 Acute on chronic combined systolic (congestive) and diastolic (congestive) heart failure: Secondary | ICD-10-CM | POA: Diagnosis not present

## 2019-06-07 DIAGNOSIS — Z9861 Coronary angioplasty status: Secondary | ICD-10-CM | POA: Diagnosis not present

## 2019-06-07 DIAGNOSIS — I214 Non-ST elevation (NSTEMI) myocardial infarction: Secondary | ICD-10-CM

## 2019-06-07 DIAGNOSIS — I252 Old myocardial infarction: Secondary | ICD-10-CM | POA: Diagnosis not present

## 2019-06-07 NOTE — Progress Notes (Signed)
Daily Session Note  Patient Details  Name: Martin Bush MRN: [id number] Date of Birth: 1955/11/13 Referring Provider:     Cardiac Rehab from 03/06/2019 in [facility] Cardiac and Pulmonary Rehab  Referring Provider  Adrian Prows MD      Encounter Date: 06/07/2019  Check In: Session Check In - 06/07/19 1459      Check-In   Supervising physician immediately available to respond to emergencies  See telemetry face sheet for immediately available ER MD    Location  [facility]-Cardiac & Pulmonary Rehab    Staff Present  Hope Budds RDN, LDN;Joseph Alcus Dad, RN BSN    Virtual Visit  No    Medication changes reported      No    Fall or balance concerns reported     No    Warm-up and Cool-down  Performed on first and last piece of equipment    Resistance Training Performed  Yes    VAD Patient?  No    PAD/SET Patient?  No      Pain Assessment   Currently in Pain?  No/denies          Social History   Tobacco Use  Smoking Status Former Smoker  . Packs/day: 0.25  . Years: 27.00  . Pack years: 6.75  . Types: Cigars  . Quit date: 2002  . Years since quitting: 19.3  Smokeless Tobacco Former Systems developer  . Quit date: 2002    Goals Met:  Independence with exercise equipment Exercise tolerated well No report of cardiac concerns or symptoms Strength training completed today  Goals Unmet:  Not Applicable  Comments: Pt able to follow exercise prescription today without complaint.  Will continue to monitor for progression.    Dr. Emily Filbert is Medical Director for [place] and [facility].

## 2019-06-13 ENCOUNTER — Other Ambulatory Visit: Payer: Self-pay

## 2019-06-13 ENCOUNTER — Encounter: Payer: Self-pay | Admitting: *Deleted

## 2019-06-13 ENCOUNTER — Encounter: Payer: Medicare Other | Admitting: *Deleted

## 2019-06-13 DIAGNOSIS — Z9861 Coronary angioplasty status: Secondary | ICD-10-CM | POA: Diagnosis not present

## 2019-06-13 DIAGNOSIS — I214 Non-ST elevation (NSTEMI) myocardial infarction: Secondary | ICD-10-CM

## 2019-06-13 DIAGNOSIS — I252 Old myocardial infarction: Secondary | ICD-10-CM | POA: Diagnosis not present

## 2019-06-13 NOTE — Progress Notes (Signed)
Daily Session Note  Patient Details  Name: Martin Bush MRN: [id number] Date of Birth: 06-30-1955 Referring Provider:     Cardiac Rehab from 03/06/2019 in [facility] Cardiac and Pulmonary Rehab  Referring Provider  Adrian Prows MD      Encounter Date: 06/13/2019  Check In: Session Check In - 06/13/19 1507      Check-In   Supervising physician immediately available to respond to emergencies  See telemetry face sheet for immediately available ER MD    Location  [facility]-Cardiac & Pulmonary Rehab    Staff Present  Renita Papa, RN Vickki Hearing, BA, ACSM CEP, Exercise Physiologist;[name] Caiola RDN, LDN    Virtual Visit  No    Medication changes reported      No    Fall or balance concerns reported     No    Warm-up and Cool-down  Performed on first and last piece of equipment    Resistance Training Performed  Yes    VAD Patient?  No    PAD/SET Patient?  No      Pain Assessment   Currently in Pain?  No/denies          Social History   Tobacco Use  Smoking Status Former Smoker  . Packs/day: 0.25  . Years: 27.00  . Pack years: 6.75  . Types: Cigars  . Quit date: 2002  . Years since quitting: 19.3  Smokeless Tobacco Former Systems developer  . Quit date: 2002    Goals Met:  Independence with exercise equipment Exercise tolerated well No report of cardiac concerns or symptoms Strength training completed today  Goals Unmet:  Not Applicable  Comments: Pt able to follow exercise prescription today without complaint.  Will continue to monitor for progression.    Dr. Emily Filbert is Medical Director for [place] and [facility].

## 2019-06-13 NOTE — Progress Notes (Signed)
Cardiac Individual Treatment Plan  Patient Details  Name: Martin Bush MRN: [id number] Date of Birth: [birth year] Referring Provider:     Cardiac Rehab from 03/06/2019 in [facility] Cardiac and Pulmonary Rehab  Referring Provider  Adrian Prows MD      Initial Encounter Date:    Cardiac Rehab from 03/06/2019 in [facility] Cardiac and Pulmonary Rehab  Date  03/06/19      Visit Diagnosis: NSTEMI (non-ST elevated myocardial infarction) ([place])  S/P PTCA (percutaneous transluminal coronary angioplasty)  Patient's Home Medications on Admission:  Current Outpatient Medications:  .  acetaminophen (TYLENOL) 325 MG tablet, Take 2 tablets (650 mg total) by mouth every 4 (four) hours as needed for headache or mild pain., Disp:  , Rfl:  .  amiodarone (PACERONE) 200 MG tablet, Take 0.5 tablets (100 mg total) by mouth daily., Disp: , Rfl:  .  aspirin EC 81 MG EC tablet, Take 1 tablet (81 mg total) by mouth daily., Disp:  , Rfl:  .  carvedilol (COREG) 6.25 MG tablet, Take 1 tablet (6.25 mg total) by mouth 2 (two) times daily with a meal., Disp:  , Rfl:  .  Cholecalciferol (VITAMIN D3) 50 MCG (2000 UT) TABS, Take by mouth 2 (two) times daily., Disp: , Rfl:  .  empagliflozin (JARDIANCE) 25 MG TABS tablet, Take 25 mg by mouth daily., Disp: , Rfl:  .  ezetimibe (ZETIA) 10 MG tablet, Take 1 tablet (10 mg total) by mouth at bedtime., Disp:  , Rfl:  .  furosemide (LASIX) 40 MG tablet, Take 40 mg by mouth daily. Currently taking ~1/week, Disp: , Rfl:  .  insulin aspart protamine- aspart (NOVOLOG MIX 70/30) (70-30) 100 UNIT/ML injection, Inject 0.25 mLs (25 Units total) into the skin 2 (two) times daily with a meal. (Patient taking differently: Inject 63 Units into the skin 2 (two) times daily with a meal. ), Disp: 10 mL, Rfl: 11 .  isosorbide mononitrate (IMDUR) 30 MG 24 hr tablet, Take 1 tablet (30 mg total) by mouth daily., Disp:  , Rfl:  .  liraglutide (VICTOZA) 18 MG/3ML SOPN, Inject 1.8 mg into the skin daily., Disp: ,  Rfl:  .  metFORMIN (GLUCOPHAGE) 500 MG tablet, Take 1,000 mg by mouth 2 (two) times daily with a meal. Taking 2 tabs twice a day, Disp: , Rfl:  .  nitroGLYCERIN (NITROSTAT) 0.4 MG SL tablet, Place 1 tablet (0.4 mg total) under the tongue every 5 (five) minutes x 3 doses as needed for chest pain., Disp:  , Rfl: 12 .  Omega-3 Fatty Acids (FISH OIL) 1000 MG CAPS, Take 1,000 mg by mouth 2 (two) times daily. , Disp: , Rfl:  .  potassium chloride SA (KLOR-CON) 20 MEQ tablet, TAKE 1 TABLET BY MOUTH TWICE A DAY, Disp: 60 tablet, Rfl: 1 .  PRESCRIPTION MEDICATION, Inhale into the lungs at bedtime. CPAP, Disp: , Rfl:  .  rosuvastatin (CRESTOR) 40 MG tablet, Take 1 tablet (40 mg total) by mouth at bedtime., Disp:  , Rfl:  .  sacubitril-valsartan (ENTRESTO) 97-103 MG, Take 1 tablet by mouth 2 (two) times daily., Disp: , Rfl:  .  sertraline (ZOLOFT) 50 MG tablet, Take 1 tablet (50 mg total) by mouth at bedtime., Disp:  , Rfl:  .  ticagrelor (BRILINTA) 90 MG TABS tablet, Take 1 tablet (90 mg total) by mouth 2 (two) times daily., Disp: 60 tablet, Rfl:   Past Medical History: Past Medical History:  Diagnosis Date  . AICD (automatic cardioverter/defibrillator) present  Medtronic  . Arthritis    "thumbs"  (08/02/2017)  . CHF (congestive heart failure) ([place])   . Chronic combined systolic and diastolic heart failure ([place])    a. 08/2017 Echo: EF 20-25%, mod glob HK. Sev distal ant sept, inflat HK. Apical AK. Gr2 DD. Mildly reduced RV fxn.  . Colon polyps   . Coronary artery disease    a. s/p CABG x 2 (LIMA->LAD, VG->OM); b. Multiple PCI's to LM/LCX/OM; c. 07/2017 PTCA of LM/LCX w/ early ISR-->repeat PCI/DES to LM (3.5x20 Synergy DES) and LCX (3.0x20 Synergy DES); d. 07/2018 Relook Cath: LM patent stent, LAD 100ost, LCX patent stent, OM1 99/60, LIMA->LAD ok. VG->dLCX 31 (old).  . Depression   . Encounter for assessment of implantable cardioverter-defibrillator (ICD) 09/27/2018  . High cholesterol   . Hypertension    . ICD; Biventricular  Medtronic ICD Amplia MRI QWuad CRT-D  in situ 10/29/14 10/29/2014   Remote ICD check 09.23.20:  One 6 beat NSVT. No therapy.  1 SVT episode @ 130 bpm (38 Sec).  There were 23 Vent sense episodes detected for up to 1.1 min/day (AT). Health trends (patient activity, heart rate variability, average heart rates) are stable.Trans-thoracic impedance trends and the OptiVol Fluid Index do no present significant abnormalities. Battery longevity is 4.3 years. RA pacing is 3  . Ischemic cardiomyopathy 09/27/2018  . MI (myocardial infarction) ([place]) 2003  . NSTEMI (non-ST elevated myocardial infarction) ([place]) 07/16/2014  . OSA on CPAP   . Oxygen deficiency   . Pneumonia 10/2014  . Proteinuria   . Sleep apnea   . 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 Packs/day: 0.25  . Years: 27.00  . Pack years: 6.75  . Types: Cigars  . Quit date: 2002  . Years since quitting: 19.3  Smokeless Tobacco Former Systems developer  . Quit date: 2002    Labs: Recent Review Flowsheet Data    Labs for ITP Cardiac and Pulmonary Rehab Latest Ref Rng & Units 11/21/2018 12/27/2018 01/03/2019 01/04/2019 04/18/2019   Cholestrol 100 - 199 mg/dL - 103 - 142 127   LDLCALC 0 - 99 mg/dL - 34 - 67 58   LDLDIRECT mg/dL - - - - -   HDL >39 mg/dL - 32.10(L) - 45 39(L)   Trlycerides 0 - 149 mg/dL - 182.0(H) - 148 181(H)   Hemoglobin A1c 4.8 - 5.6 % 14.7 11.6(H) - - 12.1(H)   PHART 7.350 - 7.450 - - - - -   PCO2ART 32.0 - 48.0 mmHg - - - - -   HCO3 20.0 - 28.0 mmol/L - - 22.9 - -   TCO2 22 - 32 mmol/L - - - - -   ACIDBASEDEF 0.0 - 2.0 mmol/L - - 5.5(H) - -   O2SAT % - - 92.7 - -       Exercise Target Goals: Exercise Program Goal: Individual exercise prescription set using results from initial 6 min walk test and THRR while considering  patient's activity barriers and safety.   Exercise Prescription Goal: Initial exercise  prescription builds to 30-45 minutes a day of aerobic activity, 2-3 days per week.  Home exercise guidelines will be given to patient during program as part of exercise prescription that the participant will acknowledge.   Education: Aerobic Exercise & Resistance Training: - Gives group verbal and written instruction on the various components of exercise. Focuses on  aerobic and resistive training programs and the benefits of this training and how to safely progress through these programs..   Education: Exercise & Equipment Safety: - Individual verbal instruction and demonstration of equipment use and safety with use of the equipment.   Cardiac Rehab from 03/06/2019 in [facility] Cardiac and Pulmonary Rehab  Date  03/06/19  Educator  [facility]  Instruction Review Code  1- Verbalizes Understanding      Education: Exercise Physiology & General Exercise Guidelines: - Group verbal and written instruction with models to review the exercise physiology of the cardiovascular system and associated critical values. Provides general exercise guidelines with specific guidelines to those with heart or lung disease.    Education: Flexibility, Balance, Mind/Body Relaxation: Provides group verbal/written instruction on the benefits of flexibility and balance training, including mind/body exercise modes such as yoga, pilates and tai chi.  Demonstration and skill practice provided.   Activity Barriers & Risk Stratification: Activity Barriers & Cardiac Risk Stratification - 03/06/19 1527      Activity Barriers & Cardiac Risk Stratification   Activity Barriers  Arthritis;Back Problems;Deconditioning;Muscular Weakness;Joint Problems;Balance Concerns   arth is thumbs and back   Cardiac Risk Stratification  High       6 Minute Walk: 6 Minute Walk    Row Name 03/06/19 1526         6 Minute Walk   Phase  Initial     Distance  1325 feet     Walk Time  6 minutes     # of Rest Breaks  0     MPH  2.51     METS   3.18     RPE  13     Perceived Dyspnea   1     VO2 Peak  11.15     Symptoms  Yes (comment)     Comments  SOB     Resting HR  65 bpm     Resting BP  124/70     Resting Oxygen Saturation   97 %     Exercise Oxygen Saturation  during 6 min walk  98 %     Max Ex. HR  110 bpm     Max Ex. BP  138/74     2 Minute Post BP  128/74        Oxygen Initial Assessment:   Oxygen Re-Evaluation:   Oxygen Discharge (Final Oxygen Re-Evaluation):   Initial Exercise Prescription: Initial Exercise Prescription - 03/06/19 1500      Date of Initial Exercise RX and Referring Provider   Date  03/06/19    Referring Provider  Adrian Prows MD      Treadmill   MPH  2.3    Grade  0.5    Minutes  15    METs  2.9      NuStep   Level  2    SPM  80    Minutes  15    METs  2      Biostep-RELP   Level  2    SPM  50    Minutes  15    METs  2      Prescription Details   Frequency (times per week)  3    Duration  Progress to 30 minutes of continuous aerobic without signs/symptoms of physical distress      Intensity   THRR 40-80% of Max Heartrate  102-139    Ratings of Perceived Exertion  11-13    Perceived Dyspnea  0-4      Progression   Progression  Continue to progress workloads to maintain intensity without signs/symptoms of physical distress.      Resistance Training   Training Prescription  Yes    Weight  3 lb       Perform Capillary Blood Glucose checks as needed.  Exercise Prescription Changes: Exercise Prescription Changes    Row Name 03/06/19 1500 03/15/19 0700 03/28/19 1800 03/29/19 1500 04/10/19 1500     Response to Exercise   Blood Pressure (Admit)  124/70  98/60  126/74  --  138/82   Blood Pressure (Exercise)  138/74  144/60  124/70  --  140/80   Blood Pressure (Exit)  128/74  90/52  122/70  --  128/68   Heart Rate (Admit)  65 bpm  82 bpm  91 bpm  --  86 bpm   Heart Rate (Exercise)  110 bpm  92 bpm  102 bpm  --  97 bpm   Heart Rate (Exit)  75 bpm  71 bpm  81 bpm  --   75 bpm   Oxygen Saturation (Admit)  97 %  --  --  --  --   Oxygen Saturation (Exercise)  98 %  --  --  --  --   Rating of Perceived Exertion (Exercise)  '13  15  14  ' --  12   Perceived Dyspnea (Exercise)  1  --  --  --  --   Symptoms  SOB  back pain on TM  none  --  none   Comments  walk test results  second full day of exericse  --  --  --   Duration  --  Continue with 30 min of aerobic exercise without signs/symptoms of physical distress.  Continue with 30 min of aerobic exercise without signs/symptoms of physical distress.  --  Continue with 30 min of aerobic exercise without signs/symptoms of physical distress.   Intensity  --  THRR unchanged  THRR unchanged  --  THRR unchanged     Progression   Progression  --  Continue to progress workloads to maintain intensity without signs/symptoms of physical distress.  Continue to progress workloads to maintain intensity without signs/symptoms of physical distress.  --  Continue to progress workloads to maintain intensity without signs/symptoms of physical distress.   Average METs  --  2.38  2.8  --  3.2     Resistance Training   Training Prescription  --  Yes  Yes  --  Yes   Weight  --  3 lb  3 lb  --  3 lb   Reps  --  10-15  10-15  --  10-15     Interval Training   Interval Training  --  No  No  --  No     Treadmill   MPH  --  1.5  2.3  --  1.3   Grade  --  0.5  0.5  --  0.5   Minutes  --  15  15  --  15   METs  --  2.25  2.9  --  2.9     NuStep   Level  --  2  4  --  4   SPM  --  --  80  --  --   Minutes  --  15  15  --  15   METs  --  2.9  2.7  --  3.2  Biostep-RELP   Level  --  2  --  --  4   Minutes  --  15  --  --  15   METs  --  2  --  --  3.7     Home Exercise Plan   Plans to continue exercise at  --  --  --  Home (comment) walking, rec bike  Home (comment) walking, rec bike   Frequency  --  --  --  Add 2 additional days to program exercise sessions.  Add 2 additional days to program exercise sessions.   Initial Home  Exercises Provided  --  --  --  03/29/19  03/29/19   Row Name 04/25/19 1400 05/10/19 1200 05/21/19 1700 06/04/19 0900       Response to Exercise   Blood Pressure (Admit)  124/60  136/80  132/66  126/64    Blood Pressure (Exercise)  134/74  160/64  150/80  156/64    Blood Pressure (Exit)  126/64  128/64  112/60  124/62    Heart Rate (Admit)  58 bpm  82 bpm  72 bpm  78 bpm    Heart Rate (Exercise)  102 bpm  127 bpm  103 bpm  98 bpm    Heart Rate (Exit)  77 bpm  81 bpm  81 bpm  86 bpm    Rating of Perceived Exertion (Exercise)  '13  11  11  12    ' Symptoms  none  none  none  none    Duration  Continue with 30 min of aerobic exercise without signs/symptoms of physical distress.  Continue with 30 min of aerobic exercise without signs/symptoms of physical distress.  Continue with 30 min of aerobic exercise without signs/symptoms of physical distress.  Continue with 30 min of aerobic exercise without signs/symptoms of physical distress.    Intensity  THRR unchanged  THRR unchanged  THRR unchanged  THRR unchanged      Progression   Progression  Continue to progress workloads to maintain intensity without signs/symptoms of physical distress.  Continue to progress workloads to maintain intensity without signs/symptoms of physical distress.  Continue to progress workloads to maintain intensity without signs/symptoms of physical distress.  Continue to progress workloads to maintain intensity without signs/symptoms of physical distress.    Average METs  2.68  3.22  3.2  2.02      Resistance Training   Training Prescription  Yes  Yes  Yes  Yes    Weight  3 lb  3 lb  3 lb  5 lb    Reps  10-15  10-15  10-15  10-15      Interval Training   Interval Training  No  No  No  No      Treadmill   MPH  2.3  1.7  1.7  1.6    Grade  0.'5  4  4  4    ' Minutes  '15  15  15  15    ' METs  2.92  3.24  3.24  3.11      Recumbant Bike   Level  --  --  --  3    Minutes  --  --  --  15    METs  --  --  --  3.2       NuStep   Level  4  --  --  4    Minutes  15  --  --  15  METs  3.8  --  --  3.2      REL-XR   Level  --  '4  4  4    ' Watts  --  --  50  --    Minutes  --  '15  15  15    ' METs  --  3.2  3.1  2.8      Biostep-RELP   Level  4  --  --  --    Minutes  15  --  --  --    METs  3  --  --  --      Home Exercise Plan   Plans to continue exercise at  Home (comment) walking, rec bike  Home (comment) walking, rec bike  Home (comment) walking, rec bike  Home (comment) walking, rec bike    Frequency  Add 2 additional days to program exercise sessions.  Add 2 additional days to program exercise sessions.  Add 2 additional days to program exercise sessions.  Add 2 additional days to program exercise sessions.    Initial Home Exercises Provided  03/29/19  03/29/19  03/29/19  03/29/19       Exercise Comments:   Exercise Goals and Review: Exercise Goals    Row Name 03/06/19 1538             Exercise Goals   Increase Physical Activity  Yes       Intervention  Provide advice, education, support and counseling about physical activity/exercise needs.;Develop an individualized exercise prescription for aerobic and resistive training based on initial evaluation findings, risk stratification, comorbidities and participant's personal goals.       Expected Outcomes  Short Term: Attend rehab on a regular basis to increase amount of physical activity.;Long Term: Add in home exercise to make exercise part of routine and to increase amount of physical activity.;Long Term: Exercising regularly at least 3-5 days a week.       Increase Strength and Stamina  Yes       Intervention  Provide advice, education, support and counseling about physical activity/exercise needs.;Develop an individualized exercise prescription for aerobic and resistive training based on initial evaluation findings, risk stratification, comorbidities and participant's personal goals.       Expected Outcomes  Short Term: Increase workloads  from initial exercise prescription for resistance, speed, and METs.;Short Term: Perform resistance training exercises routinely during rehab and add in resistance training at home;Long Term: Improve cardiorespiratory fitness, muscular endurance and strength as measured by increased METs and functional capacity (6MWT)       Able to understand and use rate of perceived exertion (RPE) scale  Yes       Intervention  Provide education and explanation on how to use RPE scale       Expected Outcomes  Short Term: Able to use RPE daily in rehab to express subjective intensity level;Long Term:  Able to use RPE to guide intensity level when exercising independently       Able to understand and use Dyspnea scale  Yes       Intervention  Provide education and explanation on how to use Dyspnea scale       Expected Outcomes  Short Term: Able to use Dyspnea scale daily in rehab to express subjective sense of shortness of breath during exertion;Long Term: Able to use Dyspnea scale to guide intensity level when exercising independently       Knowledge and understanding of Target Heart Rate Range (THRR)  Yes       Intervention  Provide education and explanation of THRR including how the numbers were predicted and where they are located for reference       Expected Outcomes  Short Term: Able to state/look up THRR;Short Term: Able to use daily as guideline for intensity in rehab;Long Term: Able to use THRR to govern intensity when exercising independently       Able to check pulse independently  Yes       Intervention  Provide education and demonstration on how to check pulse in carotid and radial arteries.;Review the importance of being able to check your own pulse for safety during independent exercise       Expected Outcomes  Short Term: Able to explain why pulse checking is important during independent exercise;Long Term: Able to check pulse independently and accurately       Understanding of Exercise Prescription  Yes        Intervention  Provide education, explanation, and written materials on patient's individual exercise prescription       Expected Outcomes  Short Term: Able to explain program exercise prescription;Long Term: Able to explain home exercise prescription to exercise independently          Exercise Goals Re-Evaluation : Exercise Goals Re-Evaluation    Row Name 03/08/19 1624 03/15/19 0743 03/28/19 1809 03/29/19 1513 04/10/19 1557     Exercise Goal Re-Evaluation   Exercise Goals Review  Increase Physical Activity;Able to understand and use rate of perceived exertion (RPE) scale;Knowledge and understanding of Target Heart Rate Range (THRR);Understanding of Exercise Prescription;Increase Strength and Stamina;Able to check pulse independently  Increase Physical Activity;Increase Strength and Stamina;Understanding of Exercise Prescription  Increase Physical Activity;Increase Strength and Stamina;Able to understand and use rate of perceived exertion (RPE) scale;Able to understand and use Dyspnea scale;Knowledge and understanding of Target Heart Rate Range (THRR);Able to check pulse independently;Understanding of Exercise Prescription  Increase Physical Activity;Increase Strength and Stamina;Able to understand and use rate of perceived exertion (RPE) scale;Able to understand and use Dyspnea scale;Knowledge and understanding of Target Heart Rate Range (THRR);Able to check pulse independently;Understanding of Exercise Prescription  Increase Physical Activity;Increase Strength and Stamina;Understanding of Exercise Prescription   Comments  Reviewed RPE scale, THR and program prescription with pt today.  Pt voiced understanding and was given a copy of goals to take home.  Ilhan is off to a good start in rehab.  He has completed his first two full days of exercise.  He was only able to get 8 min on the treadmill.  We will continue to work with him on this.  We will continue to monitor  his progress.  Tevita has  progressed to level 4 on NS and level 3 on Biostep.  Staff will monitor progress.  Reviewed home exercise with pt today.  Pt plans to walk and use bike at home for exercise.  Also talked about using staff videos. Reviewed THR, pulse, RPE, sign and symptoms, NTG use, and when to call 911 or MD.  Also discussed weather considerations and indoor options.  Pt voiced understanding.  Kordell is doing well in rehab.  He is up to level 4 on the NuStep. We will continue to montior his progress.   Expected Outcomes  Short: Use RPE daily to regulate intensity. Long: Follow program prescription in THR.  Short: Continue to attend rehab regularly  Long: Continue to improve stamina.  Short : continue to build stamina on TM Long:  improve overall MET level  Short: Start to add in walking at home.  Long: Continue to improve stamina.  Short: Increase weights  Long: Continue to improve stamina.   [place] Name 04/25/19 1402 05/10/19 1200 05/21/19 1739 05/28/19 1517 06/04/19 0905     Exercise Goal Re-Evaluation   Exercise Goals Review  Increase Physical Activity;Increase Strength and Stamina;Understanding of Exercise Prescription  Increase Physical Activity;Increase Strength and Stamina;Understanding of Exercise Prescription  Increase Physical Activity;Increase Strength and Stamina;Able to understand and use rate of perceived exertion (RPE) scale;Able to understand and use Dyspnea scale;Knowledge and understanding of Target Heart Rate Range (THRR);Able to check pulse independently;Understanding of Exercise Prescription  Increase Physical Activity;Increase Strength and Stamina  Increase Physical Activity;Increase Strength and Stamina;Understanding of Exercise Prescription   Comments  Ares has been doing well in rehab.  His back continues to give him difficulty on the treadmill.  Some days he is able to work through it, others he prefers to just use the seated equipment.  We will continue to monitor his progress.  Squire continues to do well  in rehab.  His back still limits his time on the treadmill.  He is doing well in the earlier class this month, but will switch back in May.  We will continue to monitor his progress.  Honest attends consistently.  The TM is still challenging.  He is at RPE of 10-11 on XR so staff will increase workload.  Patient is doing well in [facility] He wants to get to a point where he is not as winded when exercising. He has a stationary bike and walks at home. Informed him to continue exercise at home and continue when he is done with the program.  Malek is doing well in rehab.  He was actually able to walk for the full 20 min (includes warm up) on the treadmill last week.  His back was hurting but not to the point to make him stop. He is up to level 4 on the XR at 2.8 METs.  We will continue to monitor his progress.   Expected Outcomes  Short: Take advantage of treadmill on good days  Long: Continue to improve stamina.  Short: Continue to work with back pain.  Long; Continue to improve stamina.  Short: increase workloads on seated equipment Long: improve stamina  Short: continue to increase loads and become less short of breath. Long: maintain workloads and exercise independently  Short: Continue to take advantage of good back days with treadmill  Long: Continue to improve stamina.      Discharge Exercise Prescription (Final Exercise Prescription Changes): Exercise Prescription Changes - 06/04/19 0900      Response to Exercise   Blood Pressure (Admit)  126/64    Blood Pressure (Exercise)  156/64    Blood Pressure (Exit)  124/62    Heart Rate (Admit)  78 bpm    Heart Rate (Exercise)  98 bpm    Heart Rate (Exit)  86 bpm    Rating of Perceived Exertion (Exercise)  12    Symptoms  none    Duration  Continue with 30 min of aerobic exercise without signs/symptoms of physical distress.    Intensity  THRR unchanged      Progression   Progression  Continue to progress workloads to maintain intensity without  signs/symptoms of physical distress.    Average METs  2.02      Resistance Training   Training Prescription  Yes    Weight  5 lb    Reps  10-15      Interval Training   Interval Training  No      Treadmill   MPH  1.6    Grade  4    Minutes  15    METs  3.11      Recumbant Bike   Level  3    Minutes  15    METs  3.2      NuStep   Level  4    Minutes  15    METs  3.2      REL-XR   Level  4    Minutes  15    METs  2.8      Home Exercise Plan   Plans to continue exercise at  Home (comment)   walking, rec bike   Frequency  Add 2 additional days to program exercise sessions.    Initial Home Exercises Provided  03/29/19       Nutrition:  Target Goals: Understanding of nutrition guidelines, daily intake of sodium <1544m, cholesterol <2017m calories 30% from fat and 7% or less from saturated fats, daily to have 5 or more servings of fruits and vegetables.  Education: Controlling Sodium/Reading Food Labels -Group verbal and written material supporting the discussion of sodium use in heart healthy nutrition. Review and explanation with models, verbal and written materials for utilization of the food label.   Education: General Nutrition Guidelines/Fats and Fiber: -Group instruction provided by verbal, written material, models and posters to present the general guidelines for heart healthy nutrition. Gives an explanation and review of dietary fats and fiber.   Biometrics: Pre Biometrics - 03/06/19 1606      Pre Biometrics   Height  5' 7.75" (1.721 m)    Weight  213 lb 3.2 oz (96.7 kg)    BMI (Calculated)  32.65    Single Leg Stand  3.19 seconds        Nutrition Therapy Plan and Nutrition Goals: Nutrition Therapy & Goals - 04/19/19 1530      Nutrition Therapy   Diet  HH, Low NA, DM    Protein (specify units)  80-85g    Fiber  30 grams    Whole Grain Foods  3 servings    Saturated Fats  12 max. grams    Fruits and Vegetables  5 servings/day    Sodium  1.5  grams      Personal Nutrition Goals   Nutrition Goal  ST: continue with current changes  LT: increase fitness    Comments  Beef has given him problems. No breakfast, L: BLT or chicken/fish baked sandwich baked made himself D: chicken, sweet potato, salad (baked) - will have frozen meals occasionally. Coke zero or water (alternating). Whole wheat when he can. uses olive oil. A1c is now 8 went down from 14. Discussed HH and DM friendly eating. Pt would like to continue with current changes.      Intervention Plan   Intervention  Prescribe, educate and counsel regarding individualized specific dietary modifications aiming towards targeted core components such as weight, hypertension, lipid management, diabetes, heart failure and other comorbidities.;Nutrition handout(s) given to patient.    Expected Outcomes  Short Term Goal: Understand basic principles of dietary content, such as calories, fat, sodium, cholesterol and nutrients.;Short Term Goal: A plan has been developed with personal nutrition goals set during dietitian appointment.;Long Term Goal: Adherence to prescribed nutrition plan.       Nutrition Assessments: Nutrition Assessments - 03/06/19 1607  MEDFICTS Scores   Pre Score  42       MEDIFICTS Score Key:          ?70 Need to make dietary changes          40-70 Heart Healthy Diet         ? 40 Therapeutic Level Cholesterol Diet  Nutrition Goals Re-Evaluation: Nutrition Goals Re-Evaluation    [place] Name 06/07/19 1514             Goals   Nutrition Goal  ST: continue with current changes  LT: increase fitness       Comment  Pt would like to continue with current changes       Expected Outcome  ST: continue with current changes  LT: increase fitness          Nutrition Goals Discharge (Final Nutrition Goals Re-Evaluation): Nutrition Goals Re-Evaluation - 06/07/19 1514      Goals   Nutrition Goal  ST: continue with current changes  LT: increase fitness    Comment  Pt  would like to continue with current changes    Expected Outcome  ST: continue with current changes  LT: increase fitness       Psychosocial: Target Goals: Acknowledge presence or absence of significant depression and/or stress, maximize coping skills, provide positive support system. Participant is able to verbalize types and ability to use techniques and skills needed for reducing stress and depression.   Education: Depression - Provides group verbal and written instruction on the correlation between heart/lung disease and depressed mood, treatment options, and the stigmas associated with seeking treatment.   Education: Sleep Hygiene -Provides group verbal and written instruction about how sleep can affect your health.  Define sleep hygiene, discuss sleep cycles and impact of sleep habits. Review good sleep hygiene tips.     Education: Stress and Anxiety: - Provides group verbal and written instruction about the health risks of elevated stress and causes of high stress.  Discuss the correlation between heart/lung disease and anxiety and treatment options. Review healthy ways to manage with stress and anxiety.    Initial Review & Psychosocial Screening: Initial Psych Review & Screening - 02/28/19 1109      Initial Review   Current issues with  History of Depression;Current Psychotropic Meds;Current Stress Concerns    Source of Stress Concerns  Chronic Illness    Comments  No current symptoms of depression on Zoloft and feels that it is working well for him, significant health history      [place]?  Yes   wife     Barriers   Psychosocial barriers to participate in program  The patient should benefit from training in stress management and relaxation.;Psychosocial barriers identified (see note)      Screening Interventions   Interventions  Encouraged to exercise;To provide support and resources with identified psychosocial needs;Provide feedback about  the scores to participant    Expected Outcomes  Long Term Goal: Stressors or current issues are controlled or eliminated.;Short Term goal: Identification and review with participant of any Quality of Life or Depression concerns found by scoring the questionnaire.;Short Term goal: Utilizing psychosocial counselor, staff and physician to assist with identification of specific Stressors or current issues interfering with healing process. Setting desired goal for each stressor or current issue identified.;Long Term goal: The participant improves quality of Life and PHQ9 Scores as seen by post scores and/or verbalization of changes  Quality of Life Scores:  Quality of Life - 03/06/19 1607      Quality of Life   Select  Quality of Life      Quality of Life Scores   Health/Function Pre  20.57 %    Socioeconomic Pre  27 %    Psych/Spiritual Pre  25.5 %    Family Pre  30 %    GLOBAL Pre  24.41 %      Scores of 19 and below usually indicate a poorer quality of life in these areas.  A difference of  2-3 points is a clinically meaningful difference.  A difference of 2-3 points in the total score of the Quality of Life Index has been associated with significant improvement in overall quality of life, self-image, physical symptoms, and general health in studies assessing change in quality of life.  PHQ-9: Recent Review Flowsheet Data    Depression screen [facility] 2/9 03/06/2019 01/15/2019 12/04/2018 09/29/2018 08/30/2018   Decreased Interest 0 0 0 0 0   Down, Depressed, Hopeless 0 0 0 0 0   PHQ - 2 Score 0 0 0 0 0   Altered sleeping 1 - - 0 -   Tired, decreased energy 0 - - 0 -   Change in appetite 0 - - 0 -   Feeling bad or failure about yourself  0 - - 0 -   Trouble concentrating 0 - - 0 -   Moving slowly or fidgety/restless 0 - - 0 -   Suicidal thoughts 0 - - 0 -   PHQ-9 Score 1 - - 0 -   Difficult doing work/chores Not difficult at all - - Not difficult at all -     Interpretation of Total Score   Total Score Depression Severity:  1-4 = Minimal depression, 5-9 = Mild depression, 10-14 = Moderate depression, 15-19 = Moderately severe depression, 20-27 = Severe depression   Psychosocial Evaluation and Intervention: Psychosocial Evaluation - 02/28/19 1115      Psychosocial Evaluation & Interventions   Interventions  Encouraged to exercise with the program and follow exercise prescription    Comments  Harriet 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  He has a Estate manager/land agent in place.  He also has uncontrolled diabetes according to office notes, but feels that he is doing well overall.  His wife is his primary support system and they try not to have too many worries.  He is looking forward to getting his heart stronger and back in better shape overall.    Expected Outcomes  Short: Attend rehab to rebuild stamina again.  Long: Continue to cope well with his health.    Continue Psychosocial Services   Follow up required by staff       Psychosocial Re-Evaluation: Psychosocial Re-Evaluation    [place] Name 04/09/19 1535 05/02/19 1312 05/28/19 1519         Psychosocial Re-Evaluation   Current issues with  Current Stress Concerns  Current Stress Concerns  None Identified     Comments  Deans wife has had shoulder surgery and is helping taking care of her. He has back pain and it is tough to care for her at times. Overall he has a good outlook on his health.  He can look to his wife, son and daughter for support.  Burley is coming to HT at a different time due to his wifes  PT.  He has no other stress concerns - he says he got rid of stress when he retired.  Patient states that he does not have any concerns with his mental health. He like to stay at home and is not interested in being around people. He is positive about his health and has a good hold on what he needs to do when he is done with the program.     Expected  Outcomes  Short: Attend HeartTrack stress management education to decrease stress. Long: Maintain exercise Post HeartTrack to keep stress at a minimum.  Short: continue to attend HT Long: maintain positive attitude  Short: maintain a positive attitude in [place], Long: maintain a positive outlook on physical and mental health.     Interventions  Encouraged to attend Cardiac Rehabilitation for the exercise  --  Encouraged to attend Cardiac Rehabilitation for the exercise     Continue Psychosocial Services   Follow up required by staff  --  Follow up required by staff        Psychosocial Discharge (Final Psychosocial Re-Evaluation): Psychosocial Re-Evaluation - 05/28/19 1519      Psychosocial Re-Evaluation   Current issues with  None Identified    Comments  Patient states that he does not have any concerns with his mental health. He like to stay at home and is not interested in being around people. He is positive about his health and has a good hold on what he needs to do when he is done with the program.    Expected Outcomes  Short: maintain a positive attitude in [place], Long: maintain a positive outlook on physical and mental health.    Interventions  Encouraged to attend Cardiac Rehabilitation for the exercise    Continue Psychosocial Services   Follow up required by staff       Vocational Rehabilitation: Provide vocational rehab assistance to qualifying candidates.   Vocational Rehab Evaluation & Intervention: Vocational Rehab - 02/28/19 1109      Initial Vocational Rehab Evaluation & Intervention   Assessment shows need for Vocational Rehabilitation  No       Education: Education Goals: Education classes will be provided on a variety of topics geared toward better understanding of heart health and risk factor modification. Participant will state understanding/return demonstration of topics presented as noted by education test scores.  Learning  Barriers/Preferences: Learning Barriers/Preferences - 02/28/19 1108      Learning Barriers/Preferences   Learning Barriers  Sight   glasses   Learning Preferences  None       General Cardiac Education Topics:  AED/CPR: - Group verbal and written instruction with the use of models to demonstrate the basic use of the AED with the basic ABC's of resuscitation.   Anatomy & Physiology of the Heart: - Group verbal and written instruction and models provide basic cardiac anatomy and physiology, with the coronary electrical and arterial systems. Review of Valvular disease and Heart Failure   Cardiac Procedures: - Group verbal and written instruction to review commonly prescribed medications for heart disease. Reviews the medication, class of the drug, and side effects. Includes the steps to properly store meds and maintain the prescription regimen. (beta blockers and nitrates)   Cardiac Medications I: - Group verbal and written instruction to review commonly prescribed medications for heart disease. Reviews the medication, class of the drug, and side effects. Includes the steps to properly store meds and maintain the prescription regimen.   Cardiac Medications II: -Group verbal and  written instruction to review commonly prescribed medications for heart disease. Reviews the medication, class of the drug, and side effects. (all other drug classes)    Go Sex-Intimacy & Heart Disease, Get SMART - Goal Setting: - Group verbal and written instruction through game format to discuss heart disease and the return to sexual intimacy. Provides group verbal and written material to discuss and apply goal setting through the application of the S.M.A.R.T. Method.   Other Matters of the Heart: - Provides group verbal, written materials and models to describe Stable Angina and Peripheral Artery. Includes description of the disease process and treatment options available to the cardiac  patient.   Infection Prevention: - Provides verbal and written material to individual with discussion of infection control including proper hand washing and proper equipment cleaning during exercise session.   Cardiac Rehab from 03/06/2019 in [facility] Cardiac [facility] Pulmonary Rehab  Date  03/06/19  Educator  [facility]  Instruction Review Code  1- Verbalizes Understanding      Falls Prevention: - Provides verbal and written material to individual with discussion of falls prevention and safety.   Cardiac Rehab from 03/06/2019 in [facility] Cardiac [facility] Rehab  Date  03/06/19  Educator  [facility]  Instruction Review Code  1- Verbalizes Understanding      Other: -Provides group and verbal instruction on various topics (see comments)   Knowledge Questionnaire Score: Knowledge Questionnaire Score - 03/06/19 1607      Knowledge Questionnaire Score   Pre Score  24/26 Education Focus: Angina and Nurtition       Core Components/Risk Factors/Patient Goals at Admission: Personal Goals and Risk Factors at Admission - 03/06/19 1608      Core Components/Risk Factors/Patient Goals on Admission    Weight Management  Yes;Weight Loss;Obesity    Intervention  Weight Management: Develop a combined nutrition and exercise program designed to reach desired caloric intake, while maintaining appropriate intake of nutrient and fiber, sodium and fats, and appropriate energy expenditure required for the weight goal.;Weight Management: Provide education and appropriate resources to help participant work on and attain dietary goals.;Weight Management/Obesity: Establish reasonable short term and long term weight goals.;Obesity: Provide education and appropriate resources to help participant work on and attain dietary goals.    Admit Weight  213 lb 3.2 oz (96.7 kg)    Goal Weight: Short Term  208 lb (94.3 kg)    Goal Weight: Long Term  200 lb (90.7 kg)    Expected Outcomes  Long Term: Adherence to nutrition and physical  activity/exercise program aimed toward attainment of established weight goal;Short Term: Continue to assess and modify interventions until short term weight is achieved;Weight Loss: Understanding of general recommendations for a balanced deficit meal plan, which promotes 1-2 lb weight loss per week and includes a negative energy balance of [phone] kcal/d;Understanding recommendations for meals to include 15-35% energy as protein, 25-35% energy from fat, 35-60% energy from carbohydrates, less than 277m of dietary cholesterol, 20-35 gm of total fiber daily;Understanding of distribution of calorie intake throughout the day with the consumption of 4-5 meals/snacks    Diabetes  Yes    Intervention  Provide education about signs/symptoms and action to take for hypo/hyperglycemia.;Provide education about proper nutrition, including hydration, and aerobic/resistive exercise prescription along with prescribed medications to achieve blood glucose in normal ranges: Fasting glucose 65-99 mg/dL    Expected Outcomes  Short Term: Participant verbalizes understanding of the signs/symptoms and immediate care of hyper/hypoglycemia, proper foot care and importance of medication, aerobic/resistive  exercise and nutrition plan for blood glucose control.;Long Term: Attainment of HbA1C < 7%.    Heart Failure  Yes    Intervention  Provide a combined exercise and nutrition program that is supplemented with education, support and counseling about heart failure. Directed toward relieving symptoms such as shortness of breath, decreased exercise tolerance, and extremity edema.    Expected Outcomes  Improve functional capacity of life;Short term: Attendance in program 2-3 days a week with increased exercise capacity. Reported lower sodium intake. Reported increased fruit and vegetable intake. Reports medication compliance.;Short term: Daily weights obtained and reported for increase. Utilizing diuretic protocols set by physician.;Long  term: Adoption of self-care skills and reduction of barriers for early signs and symptoms recognition and intervention leading to self-care maintenance.    Hypertension  Yes    Intervention  Provide education on lifestyle modifcations including regular physical activity/exercise, weight management, moderate sodium restriction and increased consumption of fresh fruit, vegetables, and low fat dairy, alcohol moderation, and smoking cessation.;Monitor prescription use compliance.    Expected Outcomes  Short Term: Continued assessment and intervention until BP is < 140/45m HG in hypertensive participants. < 130/830mHG in hypertensive participants with diabetes, heart failure or chronic kidney disease.    Lipids  Yes    Intervention  Provide education and support for participant on nutrition & aerobic/resistive exercise along with prescribed medications to achieve LDL <7026mHDL >49m47m  Expected Outcomes  Short Term: Participant states understanding of desired cholesterol values and is compliant with medications prescribed. Participant is following exercise prescription and nutrition guidelines.;Long Term: Cholesterol controlled with medications as prescribed, with individualized exercise RX and with personalized nutrition plan. Value goals: LDL < 70mg44mL > 40 mg.       Education:Diabetes - Individual verbal and written instruction to review signs/symptoms of diabetes, desired ranges of glucose level fasting, after meals and with exercise. Acknowledge that pre and post exercise glucose checks will be done for 3 sessions at entry of program.   Education: Know Your Numbers and Risk Factors: -Group verbal and written instruction about important numbers in your health.  Discussion of what are risk factors and how they play a role in the disease process.  Review of Cholesterol, Blood Pressure, Diabetes, and BMI and the role they play in your overall health.   Core Components/Risk Factors/Patient Goals  Review:  Goals and Risk Factor Review    Row Name 04/09/19 1538 05/02/19 1309 05/28/19 1521         Core Components/Risk Factors/Patient Goals Review   Personal Goals Review  Weight Management/Obesity;Diabetes;Lipids;Hypertension;Heart Failure  --  Weight Management/Obesity;Diabetes;Heart Failure;Hypertension;Lipids     Review  Burlie Davariaking his medications properly and has been to the doctors recently for his diabetes. His A1C went from an 11 to a 10. Vinh Orvillno questions about his medications at this time. He is checking his sugar two times a day, one time in the morning and one time at night.  Torri Moustafa check BG at home but hasnt today.  He is taking all meds as directed.  He states hes not getting winded as quick with ADLs.  Martavius Kavontetill wanting to work on losing weight in the program. He does check his blood sugar at home regularly. His blood pressure has been doing good and he has just halved his amilodipine dosage.     Expected Outcomes  Short: Exercise and take medications as prescribed to help with overall health. Long: maintain exercise and  medications independently to improve upon overall health.  Short: continue to exercise and monitor sugars Long: manage risk factors  Short: continue to exercise for weight loss and blood pressure reading. Long: maintain blood pressure and more weight loss independently        Core Components/Risk Factors/Patient Goals at Discharge (Final Review):  Goals and Risk Factor Review - 05/28/19 1521      Core Components/Risk Factors/Patient Goals Review   Personal Goals Review  Weight Management/Obesity;Diabetes;Heart Failure;Hypertension;Lipids    Review  Sheridan is still wanting to work on losing weight in the program. He does check his blood sugar at home regularly. His blood pressure has been doing good and he has just halved his amilodipine dosage.    Expected Outcomes  Short: continue to exercise for weight loss and blood pressure reading. Long:  maintain blood pressure and more weight loss independently       ITP Comments: ITP Comments    Row Name 02/28/19 1120 03/06/19 1526 03/08/19 1622 03/21/19 0704 04/18/19 1026   ITP Comments  Completed virtual orientation today.  EP eval scheduled for 2/9 at 2pm. Documentation can be found in [facility] encounter 01/08/19.  Completed 6MWT and gym orientation.  Initial ITP created and sent for review to Dr. Emily Filbert, Medical Director.  First full day of exercise!  Patient was oriented to gym and equipment including functions, settings, policies, and procedures.  Patient's individual exercise prescription and treatment plan were reviewed.  All starting workloads were established based on the results of the 6 minute walk test done at initial orientation visit.  The plan for exercise progression was also introduced and progression will be customized based on patient's performance and goals.  30 day chart review completed. ITP sent to Dr Zachery Dakins Medical Director, for review,changes as needed and signature.  30 day chart review completed. ITP sent to Dr Zachery Dakins Medical Director, for review,changes as needed and signature.   [place] Name 05/16/19 0620 06/13/19 0650         ITP Comments  30 Day review completed. Medical Director review done, changes made as directed,and approval shown by signature of Market researcher.  30 Day review completed. ITP review done, changes made as directed,and approval shown by signature of  Scientist, research (life sciences).         Comments:

## 2019-06-15 ENCOUNTER — Ambulatory Visit: Payer: No Typology Code available for payment source

## 2019-06-15 ENCOUNTER — Other Ambulatory Visit: Payer: Self-pay

## 2019-06-15 DIAGNOSIS — I5042 Chronic combined systolic (congestive) and diastolic (congestive) heart failure: Secondary | ICD-10-CM

## 2019-06-15 DIAGNOSIS — I2510 Atherosclerotic heart disease of native coronary artery without angina pectoris: Secondary | ICD-10-CM

## 2019-06-18 ENCOUNTER — Other Ambulatory Visit: Payer: Self-pay

## 2019-06-18 ENCOUNTER — Encounter: Payer: Medicare Other | Admitting: *Deleted

## 2019-06-18 DIAGNOSIS — Z9861 Coronary angioplasty status: Secondary | ICD-10-CM | POA: Diagnosis not present

## 2019-06-18 DIAGNOSIS — I252 Old myocardial infarction: Secondary | ICD-10-CM | POA: Diagnosis not present

## 2019-06-18 DIAGNOSIS — I214 Non-ST elevation (NSTEMI) myocardial infarction: Secondary | ICD-10-CM

## 2019-06-18 NOTE — Progress Notes (Signed)
LVEF has improved compared to prior echo. Continue present medications

## 2019-06-18 NOTE — Progress Notes (Signed)
Daily Session Note  Patient Details  Name: Martin Bush MRN: [id number] Date of Birth: Nov 13, 1955 Referring Provider:     Cardiac Rehab from 03/06/2019 in [facility] Cardiac and Pulmonary Rehab  Referring Provider  Adrian Prows MD      Encounter Date: 06/18/2019  Check In: Session Check In - 06/18/19 1506      Check-In   Supervising physician immediately available to respond to emergencies  See telemetry face sheet for immediately available ER MD    Location  [facility]-Cardiac & Pulmonary Rehab    Staff Present  Renita Papa, RN BSN;Joseph [address] [place], [place], ACSM CEP, Exercise Physiologist    Virtual Visit  No    Medication changes reported      No    Fall or balance concerns reported     No    Warm-up and Cool-down  Performed on first and last piece of equipment    Resistance Training Performed  Yes    VAD Patient?  No    PAD/SET Patient?  No      Pain Assessment   Currently in Pain?  No/denies          Social History   Tobacco Use  Smoking Status Former Smoker  . Packs/day: 0.25  . Years: 27.00  . Pack years: 6.75  . Types: Cigars  . Quit date: 2002  . Years since quitting: 19.4  Smokeless Tobacco Former Systems developer  . Quit date: 2002    Goals Met:  Independence with exercise equipment Exercise tolerated well No report of cardiac concerns or symptoms Strength training completed today  Goals Unmet:  Not Applicable  Comments: Pt able to follow exercise prescription today without complaint.  Will continue to monitor for progression.    Dr. Emily Filbert is Medical Director for [facility].

## 2019-06-19 ENCOUNTER — Other Ambulatory Visit: Payer: No Typology Code available for payment source

## 2019-06-20 ENCOUNTER — Other Ambulatory Visit: Payer: Self-pay

## 2019-06-20 ENCOUNTER — Encounter: Payer: Medicare Other | Admitting: *Deleted

## 2019-06-20 DIAGNOSIS — Z9861 Coronary angioplasty status: Secondary | ICD-10-CM

## 2019-06-20 DIAGNOSIS — I214 Non-ST elevation (NSTEMI) myocardial infarction: Secondary | ICD-10-CM

## 2019-06-20 DIAGNOSIS — I252 Old myocardial infarction: Secondary | ICD-10-CM | POA: Diagnosis not present

## 2019-06-20 NOTE — Progress Notes (Signed)
Daily Session Note  Patient Details  Name: Martin Bush MRN: [id number] Date of Birth: [birth year] Referring Provider:     Cardiac Rehab from 03/06/2019 in [facility] Cardiac and Pulmonary Rehab  Referring Provider  Adrian Prows MD      Encounter Date: 06/20/2019  Check In: Session Check In - 06/20/19 1507      Check-In   Supervising physician immediately available to respond to emergencies  See telemetry face sheet for immediately available ER MD    Location  [facility]-Cardiac & Pulmonary Rehab    Staff Present  Renita Papa, RN BSN;[name] Caiola RDN, Rowe Pavy, BA, ACSM CEP, Exercise Physiologist    Virtual Visit  No    Medication changes reported      No    Fall or balance concerns reported     No    Warm-up and Cool-down  Performed on first and last piece of equipment    Resistance Training Performed  Yes    VAD Patient?  No    PAD/SET Patient?  No      Pain Assessment   Currently in Pain?  No/denies          Social History   Tobacco Use  Smoking Status Former Smoker  . Packs/day: 0.25  . Years: 27.00  . Pack years: 6.75  . Types: Cigars  . Quit date: 2002  . Years since quitting: 19.4  Smokeless Tobacco Former Systems developer  . Quit date: 2002    Goals Met:  Independence with exercise equipment Exercise tolerated well No report of cardiac concerns or symptoms Strength training completed today  Goals Unmet:  Not Applicable  Comments: Pt able to follow exercise prescription today without complaint.  Will continue to monitor for progression.    Dr. Emily Filbert is Medical Director for [place] and [facility].

## 2019-06-21 ENCOUNTER — Other Ambulatory Visit: Payer: Self-pay

## 2019-06-21 ENCOUNTER — Encounter: Payer: Medicare Other | Admitting: *Deleted

## 2019-06-21 DIAGNOSIS — I252 Old myocardial infarction: Secondary | ICD-10-CM | POA: Diagnosis not present

## 2019-06-21 DIAGNOSIS — Z9861 Coronary angioplasty status: Secondary | ICD-10-CM

## 2019-06-21 DIAGNOSIS — I214 Non-ST elevation (NSTEMI) myocardial infarction: Secondary | ICD-10-CM

## 2019-06-21 NOTE — Progress Notes (Signed)
Daily Session Note  Patient Details  Name: Martin Bush MRN: [id number] Date of Birth: [birth year] Referring Provider:     Cardiac Rehab from 03/06/2019 in [facility] Cardiac and Pulmonary Rehab  Referring Provider  Martin Prows MD      Encounter Date: 06/21/2019  Check In: Session Check In - 06/21/19 1516      Check-In   Supervising physician immediately available to respond to emergencies  See telemetry face sheet for immediately available ER MD    Location  [facility]-Cardiac & Pulmonary Rehab    Staff Present  Martin Papa, RN BSN;[name], [name], [name], CCRP, CCET    Virtual Visit  No    Medication changes reported      No    Fall or balance concerns reported     No    Warm-up and Cool-down  Performed on first and last piece of equipment    Resistance Training Performed  Yes    VAD Patient?  No    PAD/SET Patient?  No      Pain Assessment   Currently in Pain?  No/denies          Social History   Tobacco Use  Smoking Status Former Smoker  . Packs/day: 0.25  . Years: 27.00  . Pack years: 6.75  . Types: Cigars  . Quit date: 2002  . Years since quitting: 19.4  Smokeless Tobacco Former Systems developer  . Quit date: 2002    Goals Met:  Independence with exercise equipment Exercise tolerated well No report of cardiac concerns or symptoms Strength training completed today  Goals Unmet:  Not Applicable  Comments: Pt able to follow exercise prescription today without complaint.  Will continue to monitor for progression.    Dr. Emily Bush is Medical Director for [place] and [facility].

## 2019-06-22 DIAGNOSIS — I5022 Chronic systolic (congestive) heart failure: Secondary | ICD-10-CM | POA: Diagnosis not present

## 2019-06-27 ENCOUNTER — Encounter: Payer: Medicare Other | Attending: Cardiology | Admitting: *Deleted

## 2019-06-27 ENCOUNTER — Other Ambulatory Visit: Payer: Self-pay

## 2019-06-27 DIAGNOSIS — I252 Old myocardial infarction: Secondary | ICD-10-CM | POA: Insufficient documentation

## 2019-06-27 DIAGNOSIS — Z9861 Coronary angioplasty status: Secondary | ICD-10-CM | POA: Diagnosis not present

## 2019-06-27 DIAGNOSIS — I214 Non-ST elevation (NSTEMI) myocardial infarction: Secondary | ICD-10-CM

## 2019-06-27 NOTE — Progress Notes (Signed)
Daily Session Note  Patient Details  Name: Martin Bush MRN: [id number] Date of Birth: [birth year] Referring Provider:     Cardiac Rehab from 03/06/2019 in [facility] Cardiac and Pulmonary Rehab  Referring Provider  Adrian Prows MD      Encounter Date: 06/27/2019  Check In: Session Check In - 06/27/19 1503      Check-In   Supervising physician immediately available to respond to emergencies  See telemetry face sheet for immediately available ER MD    Location  [facility]-Cardiac & Pulmonary Rehab    Staff Present  Renita Papa, RN BSN;[name] Caiola RDN, Rowe Pavy, BA, ACSM CEP, Exercise Physiologist    Virtual Visit  No    Medication changes reported      No    Fall or balance concerns reported     No    Warm-up and Cool-down  Performed on first and last piece of equipment    Resistance Training Performed  Yes    VAD Patient?  No    PAD/SET Patient?  No      Pain Assessment   Currently in Pain?  No/denies          Social History   Tobacco Use  Smoking Status Former Smoker  . Packs/day: 0.25  . Years: 27.00  . Pack years: 6.75  . Types: Cigars  . Quit date: 2002  . Years since quitting: 19.4  Smokeless Tobacco Former Systems developer  . Quit date: 2002    Goals Met:  Independence with exercise equipment Exercise tolerated well No report of cardiac concerns or symptoms Strength training completed today  Goals Unmet:  Not Applicable  Comments: Pt able to follow exercise prescription today without complaint.  Will continue to monitor for progression.    Dr. Emily Filbert is Medical Director for [place] and [facility].

## 2019-06-28 ENCOUNTER — Other Ambulatory Visit: Payer: Self-pay

## 2019-06-28 ENCOUNTER — Encounter: Payer: Medicare Other | Admitting: *Deleted

## 2019-06-28 DIAGNOSIS — Z9861 Coronary angioplasty status: Secondary | ICD-10-CM

## 2019-06-28 DIAGNOSIS — I214 Non-ST elevation (NSTEMI) myocardial infarction: Secondary | ICD-10-CM

## 2019-06-28 DIAGNOSIS — I252 Old myocardial infarction: Secondary | ICD-10-CM | POA: Diagnosis not present

## 2019-06-28 NOTE — Progress Notes (Signed)
Daily Session Note  Patient Details  Name: Martin Bush MRN: [id number] Date of Birth: April 01, 1955 Referring Provider:     Cardiac Rehab from 03/06/2019 in [facility] Cardiac and Pulmonary Rehab  Referring Provider  Adrian Prows MD      Encounter Date: 06/28/2019  Check In: Session Check In - 06/28/19 1511      Check-In   Supervising physician immediately available to respond to emergencies  See telemetry face sheet for immediately available ER MD    Location  [facility]-Cardiac & Pulmonary Rehab    Staff Present  Renita Papa, RN Vickki Hearing, BA, ACSM CEP, Exercise Physiologist;Laureen Owens Shark, BS, RRT, CPFT    Virtual Visit  No    Medication changes reported      No    Fall or balance concerns reported     No    Warm-up and Cool-down  Performed on first and last piece of equipment    Resistance Training Performed  Yes    VAD Patient?  No    PAD/SET Patient?  No      Pain Assessment   Currently in Pain?  No/denies          Social History   Tobacco Use  Smoking Status Former Smoker  . Packs/day: 0.25  . Years: 27.00  . Pack years: 6.75  . Types: Cigars  . Quit date: 2002  . Years since quitting: 19.4  Smokeless Tobacco Former Systems developer  . Quit date: 2002    Goals Met:  Independence with exercise equipment Exercise tolerated well No report of cardiac concerns or symptoms Strength training completed today  Goals Unmet:  Not Applicable  Comments: Pt able to follow exercise prescription today without complaint.  Will continue to monitor for progression.    Dr. Emily Filbert is Medical Director for [place] and [facility].

## 2019-07-02 ENCOUNTER — Encounter: Payer: Medicare Other | Admitting: *Deleted

## 2019-07-02 ENCOUNTER — Other Ambulatory Visit: Payer: Self-pay

## 2019-07-02 DIAGNOSIS — I214 Non-ST elevation (NSTEMI) myocardial infarction: Secondary | ICD-10-CM

## 2019-07-02 DIAGNOSIS — Z9861 Coronary angioplasty status: Secondary | ICD-10-CM | POA: Diagnosis not present

## 2019-07-02 DIAGNOSIS — I252 Old myocardial infarction: Secondary | ICD-10-CM | POA: Diagnosis not present

## 2019-07-02 NOTE — Progress Notes (Signed)
Discharge Progress Report  Patient Details  Name: Martin Bush MRN: [id number] Date of Birth: 26-Feb-1955 Referring Provider:     Cardiac Rehab from 03/06/2019 in [facility] Cardiac and Pulmonary Rehab  Referring Provider  Adrian Prows MD       Number of Visits: 36  Reason for Discharge:  Patient reached a stable level of exercise. Patient independent in their exercise. Patient has met program and personal goals.  Smoking History:  Social History   Tobacco Use  Smoking Status Former Smoker  . Packs/day: 0.25  . Years: 27.00  . Pack years: 6.75  . Types: Cigars  . Quit date: 2002  . Years since quitting: 19.4  Smokeless Tobacco Former Systems developer  . Quit date: 2002    Diagnosis:  NSTEMI (non-ST elevated myocardial infarction) ([place])  S/P PTCA (percutaneous transluminal coronary angioplasty)  ADL UCSD:   Initial Exercise Prescription: Initial Exercise Prescription - 03/06/19 1500      Date of Initial Exercise RX and Referring Provider   Date  03/06/19    Referring Provider  Adrian Prows MD      Treadmill   MPH  2.3    Grade  0.5    Minutes  15    METs  2.9      NuStep   Level  2    SPM  80    Minutes  15    METs  2      Biostep-RELP   Level  2    SPM  50    Minutes  15    METs  2      Prescription Details   Frequency (times per week)  3    Duration  Progress to 30 minutes of continuous aerobic without signs/symptoms of physical distress      Intensity   THRR 40-80% of Max Heartrate  102-139    Ratings of Perceived Exertion  11-13    Perceived Dyspnea  0-4      Progression   Progression  Continue to progress workloads to maintain intensity without signs/symptoms of physical distress.      Resistance Training   Training Prescription  Yes    Weight  3 lb       Discharge Exercise Prescription (Final Exercise Prescription Changes): Exercise Prescription Changes - 06/19/19 1500      Response to Exercise   Blood Pressure (Admit)  138/82    Blood Pressure  (Exercise)  142/82    Blood Pressure (Exit)  134/82    Heart Rate (Admit)  48 bpm    Heart Rate (Exercise)  100 bpm    Heart Rate (Exit)  84 bpm    Rating of Perceived Exertion (Exercise)  12    Symptoms  none    Duration  Continue with 30 min of aerobic exercise without signs/symptoms of physical distress.    Intensity  THRR unchanged      Progression   Progression  Continue to progress workloads to maintain intensity without signs/symptoms of physical distress.    Average METs  3.5      Resistance Training   Training Prescription  Yes    Weight  5 lb    Reps  10-15      Interval Training   Interval Training  No      Recumbant Bike   Level  3    RPM  60    Minutes  15    METs  3.5      NuStep  Level  5    SPM  80    Minutes  15    METs  3.6       Functional Capacity: 6 Minute Walk    Row Name 03/06/19 1526 06/13/19 1608       6 Minute Walk   Phase  Initial  Discharge    Distance  1325 feet  1335 feet    Distance % Change  --  10 %    Walk Time  6 minutes  6 minutes    # of Rest Breaks  0  0    MPH  2.51  2.53    METS  3.18  3.05    RPE  13  15    Perceived Dyspnea   1  1    VO2 Peak  11.15  10.69    Symptoms  Yes (comment)  No    Comments  SOB  --    Resting HR  65 bpm  67 bpm    Resting BP  124/70  118/62    Resting Oxygen Saturation   97 %  --    Exercise Oxygen Saturation  during 6 min walk  98 %  --    Max Ex. HR  110 bpm  112 bpm    Max Ex. BP  138/74  124/66    2 Minute Post BP  128/74  --       Psychological, QOL, Others - Outcomes: PHQ 2/9: Depression screen [facility] 2/9 07/02/2019 03/06/2019 01/15/2019 12/04/2018 09/29/2018  Decreased Interest 0 0 0 0 0  Down, Depressed, Hopeless 0 0 0 0 0  PHQ - 2 Score 0 0 0 0 0  Altered sleeping 0 1 - - 0  Tired, decreased energy 0 0 - - 0  Change in appetite 0 0 - - 0  Feeling bad or failure about yourself  0 0 - - 0  Trouble concentrating 0 0 - - 0  Moving slowly or fidgety/restless 0 0 - - 0  Suicidal  thoughts 0 0 - - 0  PHQ-9 Score 0 1 - - 0  Difficult doing work/chores Not difficult at all Not difficult at all - - Not difficult at all    Quality of Life: Quality of Life - 07/02/19 1522      Quality of Life   Select  Quality of Life      Quality of Life Scores   Health/Function Pre  20.57 %    Health/Function Post  28 %    Health/Function % Change  36.12 %    Socioeconomic Pre  27 %    Socioeconomic Post  30 %    Socioeconomic % Change   11.11 %    Psych/Spiritual Pre  25.5 %    Psych/Spiritual Post  30 %    Psych/Spiritual % Change  17.65 %    Family Pre  30 %    Family Post  30 %    Family % Change  0 %    GLOBAL Pre  24.41 %    GLOBAL Post  29.09 %    GLOBAL % Change  19.17 %        Nutrition & Weight - Outcomes: Pre Biometrics - 03/06/19 1606      Pre Biometrics   Height  5' 7.75" (1.721 m)    Weight  213 lb 3.2 oz (96.7 kg)    BMI (Calculated)  32.65    Single Leg Stand  3.19 seconds        Nutrition: Nutrition Therapy & Goals - 04/19/19 1530      Nutrition Therapy   Diet  HH, Low NA, DM    Protein (specify units)  80-85g    Fiber  30 grams    Whole Grain Foods  3 servings    Saturated Fats  12 max. grams    Fruits and Vegetables  5 servings/day    Sodium  1.5 grams      Personal Nutrition Goals   Nutrition Goal  ST: continue with current changes  LT: increase fitness    Comments  Beef has given him problems. No breakfast, L: BLT or chicken/fish baked sandwich baked made himself D: chicken, sweet potato, salad (baked) - will have frozen meals occasionally. Coke zero or water (alternating). Whole wheat when he can. uses olive oil. A1c is now 8 went down from 14. Discussed HH and DM friendly eating. Pt would like to continue with current changes.      Intervention Plan   Intervention  Prescribe, educate and counsel regarding individualized specific dietary modifications aiming towards targeted core components such as weight, hypertension, lipid  management, diabetes, heart failure and other comorbidities.;Nutrition handout(s) given to patient.    Expected Outcomes  Short Term Goal: Understand basic principles of dietary content, such as calories, fat, sodium, cholesterol and nutrients.;Short Term Goal: A plan has been developed with personal nutrition goals set during dietitian appointment.;Long Term Goal: Adherence to prescribed nutrition plan.       Nutrition Discharge: Nutrition Assessments - 03/06/19 1607      MEDFICTS Scores   Pre Score  42       Education Questionnaire Score: Knowledge Questionnaire Score - 07/02/19 1522      Knowledge Questionnaire Score   Pre Score  24/26 Education Focus: Angina and Nurtition    Post Score  25/26       Goals reviewed with patient; copy given to patient.

## 2019-07-02 NOTE — Patient Instructions (Signed)
Discharge Patient Instructions  Patient Details  Name: Martin Bush MRN: [id number] Date of Birth: March 14, 1955 Referring Provider:  Adrian Prows, MD   Number of Visits: 53  Reason for Discharge:  Patient reached a stable level of exercise. Patient independent in their exercise. Patient has met program and personal goals.  Smoking History:  Social History   Tobacco Use  Smoking Status Former Smoker  . Packs/day: 0.25  . Years: 27.00  . Pack years: 6.75  . Types: Cigars  . Quit date: 2002  . Years since quitting: 19.4  Smokeless Tobacco Former Systems developer  . Quit date: 2002    Diagnosis:  NSTEMI (non-ST elevated myocardial infarction) ([place])  S/P PTCA (percutaneous transluminal coronary angioplasty)  Initial Exercise Prescription: Initial Exercise Prescription - 03/06/19 1500      Date of Initial Exercise RX and Referring Provider   Date  03/06/19    Referring Provider  Adrian Prows MD      Treadmill   MPH  2.3    Grade  0.5    Minutes  15    METs  2.9      NuStep   Level  2    SPM  80    Minutes  15    METs  2      Biostep-RELP   Level  2    SPM  50    Minutes  15    METs  2      Prescription Details   Frequency (times per week)  3    Duration  Progress to 30 minutes of continuous aerobic without signs/symptoms of physical distress      Intensity   THRR 40-80% of Max Heartrate  102-139    Ratings of Perceived Exertion  11-13    Perceived Dyspnea  0-4      Progression   Progression  Continue to progress workloads to maintain intensity without signs/symptoms of physical distress.      Resistance Training   Training Prescription  Yes    Weight  3 lb       Discharge Exercise Prescription (Final Exercise Prescription Changes): Exercise Prescription Changes - 06/19/19 1500      Response to Exercise   Blood Pressure (Admit)  138/82    Blood Pressure (Exercise)  142/82    Blood Pressure (Exit)  134/82    Heart Rate (Admit)  48 bpm    Heart Rate  (Exercise)  100 bpm    Heart Rate (Exit)  84 bpm    Rating of Perceived Exertion (Exercise)  12    Symptoms  none    Duration  Continue with 30 min of aerobic exercise without signs/symptoms of physical distress.    Intensity  THRR unchanged      Progression   Progression  Continue to progress workloads to maintain intensity without signs/symptoms of physical distress.    Average METs  3.5      Resistance Training   Training Prescription  Yes    Weight  5 lb    Reps  10-15      Interval Training   Interval Training  No      Recumbant Bike   Level  3    RPM  60    Minutes  15    METs  3.5      NuStep   Level  5    SPM  80    Minutes  15    METs  3.6  Functional Capacity: 6 Minute Walk    Row Name 03/06/19 1526 06/13/19 1608       6 Minute Walk   Phase  Initial  Discharge    Distance  1325 feet  1335 feet    Distance % Change  --  10 %    Walk Time  6 minutes  6 minutes    # of Rest Breaks  0  0    MPH  2.51  2.53    METS  3.18  3.05    RPE  13  15    Perceived Dyspnea   1  1    VO2 Peak  11.15  10.69    Symptoms  Yes (comment)  No    Comments  SOB  --    Resting HR  65 bpm  67 bpm    Resting BP  124/70  118/62    Resting Oxygen Saturation   97 %  --    Exercise Oxygen Saturation  during 6 min walk  98 %  --    Max Ex. HR  110 bpm  112 bpm    Max Ex. BP  138/74  124/66    2 Minute Post BP  128/74  --       Quality of Life: Quality of Life - 03/06/19 1607      Quality of Life   Select  Quality of Life      Quality of Life Scores   Health/Function Pre  20.57 %    Socioeconomic Pre  27 %    Psych/Spiritual Pre  25.5 %    Family Pre  30 %    GLOBAL Pre  24.41 %        Nutrition & Weight - Outcomes: Pre Biometrics - 03/06/19 1606      Pre Biometrics   Height  5' 7.75" (1.721 m)    Weight  213 lb 3.2 oz (96.7 kg)    BMI (Calculated)  32.65    Single Leg Stand  3.19 seconds        Nutrition: Nutrition Therapy & Goals - 04/19/19  1530      Nutrition Therapy   Diet  HH, Low NA, DM    Protein (specify units)  80-85g    Fiber  30 grams    Whole Grain Foods  3 servings    Saturated Fats  12 max. grams    Fruits and Vegetables  5 servings/day    Sodium  1.5 grams      Personal Nutrition Goals   Nutrition Goal  ST: continue with current changes  LT: increase fitness    Comments  Beef has given him problems. No breakfast, L: BLT or chicken/fish baked sandwich baked made himself D: chicken, sweet potato, salad (baked) - will have frozen meals occasionally. Coke zero or water (alternating). Whole wheat when he can. uses olive oil. A1c is now 8 went down from 14. Discussed HH and DM friendly eating. Pt would like to continue with current changes.      Intervention Plan   Intervention  Prescribe, educate and counsel regarding individualized specific dietary modifications aiming towards targeted core components such as weight, hypertension, lipid management, diabetes, heart failure and other comorbidities.;Nutrition handout(s) given to patient.    Expected Outcomes  Short Term Goal: Understand basic principles of dietary content, such as calories, fat, sodium, cholesterol and nutrients.;Short Term Goal: A plan has been developed with personal nutrition goals set during dietitian appointment.;Long  Term Goal: Adherence to prescribed nutrition plan.       Nutrition Discharge: Nutrition Assessments - 03/06/19 1607      MEDFICTS Scores   Pre Score  42       Education Questionnaire Score: Knowledge Questionnaire Score - 03/06/19 1607      Knowledge Questionnaire Score   Pre Score  24/26 Education Focus: Angina and Nurtition       Goals reviewed with patient; copy given to patient.

## 2019-07-02 NOTE — Progress Notes (Signed)
Cardiac Individual Treatment Plan  Patient Details  Name: Martin Bush MRN: [id number] Date of Birth: Dec 20, 1955 Referring Provider:     Cardiac Rehab from 03/06/2019 in [facility] Cardiac and Pulmonary Rehab  Referring Provider  Adrian Prows MD      Initial Encounter Date:    Cardiac Rehab from 03/06/2019 in [facility] Cardiac and Pulmonary Rehab  Date  03/06/19      Visit Diagnosis: NSTEMI (non-ST elevated myocardial infarction) ([place])  S/P PTCA (percutaneous transluminal coronary angioplasty)  Patient's Home Medications on Admission:  Current Outpatient Medications:  .  acetaminophen (TYLENOL) 325 MG tablet, Take 2 tablets (650 mg total) by mouth every 4 (four) hours as needed for headache or mild pain., Disp:  , Rfl:  .  amiodarone (PACERONE) 200 MG tablet, Take 0.5 tablets (100 mg total) by mouth daily., Disp: , Rfl:  .  aspirin EC 81 MG EC tablet, Take 1 tablet (81 mg total) by mouth daily., Disp:  , Rfl:  .  carvedilol (COREG) 6.25 MG tablet, Take 1 tablet (6.25 mg total) by mouth 2 (two) times daily with a meal., Disp:  , Rfl:  .  Cholecalciferol (VITAMIN D3) 50 MCG (2000 UT) TABS, Take by mouth 2 (two) times daily., Disp: , Rfl:  .  empagliflozin (JARDIANCE) 25 MG TABS tablet, Take 25 mg by mouth daily., Disp: , Rfl:  .  ezetimibe (ZETIA) 10 MG tablet, Take 1 tablet (10 mg total) by mouth at bedtime., Disp:  , Rfl:  .  furosemide (LASIX) 40 MG tablet, Take 40 mg by mouth daily. Currently taking ~1/week, Disp: , Rfl:  .  insulin aspart protamine- aspart (NOVOLOG MIX 70/30) (70-30) 100 UNIT/ML injection, Inject 0.25 mLs (25 Units total) into the skin 2 (two) times daily with a meal. (Patient taking differently: Inject 63 Units into the skin 2 (two) times daily with a meal. ), Disp: 10 mL, Rfl: 11 .  isosorbide mononitrate (IMDUR) 30 MG 24 hr tablet, Take 1 tablet (30 mg total) by mouth daily., Disp:  , Rfl:  .  liraglutide (VICTOZA) 18 MG/3ML SOPN, Inject 1.8 mg into the skin daily., Disp: ,  Rfl:  .  metFORMIN (GLUCOPHAGE) 500 MG tablet, Take 1,000 mg by mouth 2 (two) times daily with a meal. Taking 2 tabs twice a day, Disp: , Rfl:  .  nitroGLYCERIN (NITROSTAT) 0.4 MG SL tablet, Place 1 tablet (0.4 mg total) under the tongue every 5 (five) minutes x 3 doses as needed for chest pain., Disp:  , Rfl: 12 .  Omega-3 Fatty Acids (FISH OIL) 1000 MG CAPS, Take 1,000 mg by mouth 2 (two) times daily. , Disp: , Rfl:  .  potassium chloride SA (KLOR-CON) 20 MEQ tablet, TAKE 1 TABLET BY MOUTH TWICE A DAY, Disp: 60 tablet, Rfl: 1 .  PRESCRIPTION MEDICATION, Inhale into the lungs at bedtime. CPAP, Disp: , Rfl:  .  rosuvastatin (CRESTOR) 40 MG tablet, Take 1 tablet (40 mg total) by mouth at bedtime., Disp:  , Rfl:  .  sacubitril-valsartan (ENTRESTO) 97-103 MG, Take 1 tablet by mouth 2 (two) times daily., Disp: , Rfl:  .  sertraline (ZOLOFT) 50 MG tablet, Take 1 tablet (50 mg total) by mouth at bedtime., Disp:  , Rfl:  .  ticagrelor (BRILINTA) 90 MG TABS tablet, Take 1 tablet (90 mg total) by mouth 2 (two) times daily., Disp: 60 tablet, Rfl:   Past Medical History: Past Medical History:  Diagnosis Date  . AICD (automatic cardioverter/defibrillator) present  Medtronic  . Arthritis    "thumbs"  (08/02/2017)  . CHF (congestive heart failure) ([place])   . Chronic combined systolic and diastolic heart failure ([place])    a. 08/2017 Echo: EF 20-25%, mod glob HK. Sev distal ant sept, inflat HK. Apical AK. Gr2 DD. Mildly reduced RV fxn.  . Colon polyps   . Coronary artery disease    a. s/p CABG x 2 (LIMA->LAD, VG->OM); b. Multiple PCI's to LM/LCX/OM; c. 07/2017 PTCA of LM/LCX w/ early ISR-->repeat PCI/DES to LM (3.5x20 Synergy DES) and LCX (3.0x20 Synergy DES); d. 07/2018 Relook Cath: LM patent stent, LAD 100ost, LCX patent stent, OM1 99/60, LIMA->LAD ok. VG->dLCX 16 (old).  . Depression   . Encounter for assessment of implantable cardioverter-defibrillator (ICD) 09/27/2018  . High cholesterol   . Hypertension    . ICD; Biventricular  Medtronic ICD Amplia MRI QWuad CRT-D  in situ 10/29/14 10/29/2014   Remote ICD check 09.23.20:  One 6 beat NSVT. No therapy.  1 SVT episode @ 130 bpm (38 Sec).  There were 23 Vent sense episodes detected for up to 1.1 min/day (AT). Health trends (patient activity, heart rate variability, average heart rates) are stable.Trans-thoracic impedance trends and the OptiVol Fluid Index do no present significant abnormalities. Battery longevity is 4.3 years. RA pacing is 3  . Ischemic cardiomyopathy 09/27/2018  . MI (myocardial infarction) ([place]) 2003  . NSTEMI (non-ST elevated myocardial infarction) ([place]) 07/16/2014  . OSA on CPAP   . Oxygen deficiency   . Pneumonia 10/2014  . Proteinuria   . Sleep apnea   . 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 Packs/day: 0.25  . Years: 27.00  . Pack years: 6.75  . Types: Cigars  . Quit date: 2002  . Years since quitting: 19.4  Smokeless Tobacco Former Systems developer  . Quit date: 2002    Labs: Recent Review Flowsheet Data    Labs for ITP Cardiac and Pulmonary Rehab Latest Ref Rng & Units 11/21/2018 12/27/2018 01/03/2019 01/04/2019 04/18/2019   Cholestrol 100 - 199 mg/dL - 103 - 142 127   LDLCALC 0 - 99 mg/dL - 34 - 67 58   LDLDIRECT mg/dL - - - - -   HDL >39 mg/dL - 32.10(L) - 45 39(L)   Trlycerides 0 - 149 mg/dL - 182.0(H) - 148 181(H)   Hemoglobin A1c 4.8 - 5.6 % 14.7 11.6(H) - - 12.1(H)   PHART 7.350 - 7.450 - - - - -   PCO2ART 32.0 - 48.0 mmHg - - - - -   HCO3 20.0 - 28.0 mmol/L - - 22.9 - -   TCO2 22 - 32 mmol/L - - - - -   ACIDBASEDEF 0.0 - 2.0 mmol/L - - 5.5(H) - -   O2SAT % - - 92.7 - -       Exercise Target Goals: Exercise Program Goal: Individual exercise prescription set using results from initial 6 min walk test and THRR while considering  patient's activity barriers and safety.   Exercise Prescription Goal: Initial exercise  prescription builds to 30-45 minutes a day of aerobic activity, 2-3 days per week.  Home exercise guidelines will be given to patient during program as part of exercise prescription that the participant will acknowledge.   Education: Aerobic Exercise & Resistance Training: - Gives group verbal and written instruction on the various components of exercise. Focuses on  aerobic and resistive training programs and the benefits of this training and how to safely progress through these programs..   Education: Exercise & Equipment Safety: - Individual verbal instruction and demonstration of equipment use and safety with use of the equipment.   Cardiac Rehab from 03/06/2019 in [facility] Cardiac and Pulmonary Rehab  Date  03/06/19  Educator  [facility]  Instruction Review Code  1- Verbalizes Understanding      Education: Exercise Physiology & General Exercise Guidelines: - Group verbal and written instruction with models to review the exercise physiology of the cardiovascular system and associated critical values. Provides general exercise guidelines with specific guidelines to those with heart or lung disease.    Education: Flexibility, Balance, Mind/Body Relaxation: Provides group verbal/written instruction on the benefits of flexibility and balance training, including mind/body exercise modes such as yoga, pilates and tai chi.  Demonstration and skill practice provided.   Activity Barriers & Risk Stratification: Activity Barriers & Cardiac Risk Stratification - 03/06/19 1527      Activity Barriers & Cardiac Risk Stratification   Activity Barriers  Arthritis;Back Problems;Deconditioning;Muscular Weakness;Joint Problems;Balance Concerns   arth is thumbs and back   Cardiac Risk Stratification  High       6 Minute Walk: 6 Minute Walk    Row Name 03/06/19 1526 06/13/19 1608       6 Minute Walk   Phase  Initial  Discharge    Distance  1325 feet  1335 feet    Distance % Change  --  10 %    Walk Time  6  minutes  6 minutes    # of Rest Breaks  0  0    MPH  2.51  2.53    METS  3.18  3.05    RPE  13  15    Perceived Dyspnea   1  1    VO2 Peak  11.15  10.69    Symptoms  Yes (comment)  No    Comments  SOB  --    Resting HR  65 bpm  67 bpm    Resting BP  124/70  118/62    Resting Oxygen Saturation   97 %  --    Exercise Oxygen Saturation  during 6 min walk  98 %  --    Max Ex. HR  110 bpm  112 bpm    Max Ex. BP  138/74  124/66    2 Minute Post BP  128/74  --       Oxygen Initial Assessment:   Oxygen Re-Evaluation:   Oxygen Discharge (Final Oxygen Re-Evaluation):   Initial Exercise Prescription: Initial Exercise Prescription - 03/06/19 1500      Date of Initial Exercise RX and Referring Provider   Date  03/06/19    Referring Provider  Adrian Prows MD      Treadmill   MPH  2.3    Grade  0.5    Minutes  15    METs  2.9      NuStep   Level  2    SPM  80    Minutes  15    METs  2      Biostep-RELP   Level  2    SPM  50    Minutes  15    METs  2      Prescription Details   Frequency (times per week)  3    Duration  Progress to 30 minutes of continuous aerobic without  signs/symptoms of physical distress      Intensity   THRR 40-80% of Max Heartrate  102-139    Ratings of Perceived Exertion  11-13    Perceived Dyspnea  0-4      Progression   Progression  Continue to progress workloads to maintain intensity without signs/symptoms of physical distress.      Resistance Training   Training Prescription  Yes    Weight  3 lb       Perform Capillary Blood Glucose checks as needed.  Exercise Prescription Changes: Exercise Prescription Changes    Row Name 03/06/19 1500 03/15/19 0700 03/28/19 1800 03/29/19 1500 04/10/19 1500     Response to Exercise   Blood Pressure (Admit)  124/70  98/60  126/74  --  138/82   Blood Pressure (Exercise)  138/74  144/60  124/70  --  140/80   Blood Pressure (Exit)  128/74  90/52  122/70  --  128/68   Heart Rate (Admit)  65 bpm  82  bpm  91 bpm  --  86 bpm   Heart Rate (Exercise)  110 bpm  92 bpm  102 bpm  --  97 bpm   Heart Rate (Exit)  75 bpm  71 bpm  81 bpm  --  75 bpm   Oxygen Saturation (Admit)  97 %  --  --  --  --   Oxygen Saturation (Exercise)  98 %  --  --  --  --   Rating of Perceived Exertion (Exercise)  '13  15  14  ' --  12   Perceived Dyspnea (Exercise)  1  --  --  --  --   Symptoms  SOB  back pain on TM  none  --  none   Comments  walk test results  second full day of exericse  --  --  --   Duration  --  Continue with 30 min of aerobic exercise without signs/symptoms of physical distress.  Continue with 30 min of aerobic exercise without signs/symptoms of physical distress.  --  Continue with 30 min of aerobic exercise without signs/symptoms of physical distress.   Intensity  --  THRR unchanged  THRR unchanged  --  THRR unchanged     Progression   Progression  --  Continue to progress workloads to maintain intensity without signs/symptoms of physical distress.  Continue to progress workloads to maintain intensity without signs/symptoms of physical distress.  --  Continue to progress workloads to maintain intensity without signs/symptoms of physical distress.   Average METs  --  2.38  2.8  --  3.2     Resistance Training   Training Prescription  --  Yes  Yes  --  Yes   Weight  --  3 lb  3 lb  --  3 lb   Reps  --  10-15  10-15  --  10-15     Interval Training   Interval Training  --  No  No  --  No     Treadmill   MPH  --  1.5  2.3  --  1.3   Grade  --  0.5  0.5  --  0.5   Minutes  --  15  15  --  15   METs  --  2.25  2.9  --  2.9     NuStep   Level  --  2  4  --  4   SPM  --  --  80  --  --   Minutes  --  15  15  --  15   METs  --  2.9  2.7  --  3.2     Biostep-RELP   Level  --  2  --  --  4   Minutes  --  15  --  --  15   METs  --  2  --  --  3.7     Home Exercise Plan   Plans to continue exercise at  --  --  --  Home (comment) walking, rec bike  Home (comment) walking, rec bike   Frequency   --  --  --  Add 2 additional days to program exercise sessions.  Add 2 additional days to program exercise sessions.   Initial Home Exercises Provided  --  --  --  03/29/19  03/29/19   Row Name 04/25/19 1400 05/10/19 1200 05/21/19 1700 06/04/19 0900 06/18/19 1000     Response to Exercise   Blood Pressure (Admit)  124/60  136/80  132/66  126/64  118/62   Blood Pressure (Exercise)  134/74  160/64  150/80  156/64  124/66   Blood Pressure (Exit)  126/64  128/64  112/60  124/62  120/60   Heart Rate (Admit)  58 bpm  82 bpm  72 bpm  78 bpm  67 bpm   Heart Rate (Exercise)  102 bpm  127 bpm  103 bpm  98 bpm  112 bpm   Heart Rate (Exit)  77 bpm  81 bpm  81 bpm  86 bpm  69 bpm   Rating of Perceived Exertion (Exercise)  '13  11  11  12  14   ' Symptoms  none  none  none  none  none   Duration  Continue with 30 min of aerobic exercise without signs/symptoms of physical distress.  Continue with 30 min of aerobic exercise without signs/symptoms of physical distress.  Continue with 30 min of aerobic exercise without signs/symptoms of physical distress.  Continue with 30 min of aerobic exercise without signs/symptoms of physical distress.  Continue with 30 min of aerobic exercise without signs/symptoms of physical distress.   Intensity  THRR unchanged  THRR unchanged  THRR unchanged  THRR unchanged  THRR unchanged     Progression   Progression  Continue to progress workloads to maintain intensity without signs/symptoms of physical distress.  Continue to progress workloads to maintain intensity without signs/symptoms of physical distress.  Continue to progress workloads to maintain intensity without signs/symptoms of physical distress.  Continue to progress workloads to maintain intensity without signs/symptoms of physical distress.  Continue to progress workloads to maintain intensity without signs/symptoms of physical distress.   Average METs  2.68  3.22  3.2  2.02  3.25     Resistance Training   Training  Prescription  Yes  Yes  Yes  Yes  Yes   Weight  3 lb  3 lb  3 lb  5 lb  5 lb   Reps  10-15  10-15  10-15  10-15  10-15     Interval Training   Interval Training  No  No  No  No  No     Treadmill   MPH  2.3  1.7  1.7  1.6  --   Grade  0.'5  4  4  4  ' --   Minutes  '15  15  15  15  ' --   METs  2.92  3.24  3.24  3.11  --     Recumbant Bike   Level  --  --  --  3  3   Minutes  --  --  --  15  15   METs  --  --  --  3.2  2.79     NuStep   Level  4  --  --  4  5   SPM  --  --  --  --  80   Minutes  15  --  --  15  15   METs  3.8  --  --  3.2  3.7     REL-XR   Level  --  '4  4  4  ' --   Watts  --  --  50  --  --   Minutes  --  '15  15  15  ' --   METs  --  3.2  3.1  2.8  --     Biostep-RELP   Level  4  --  --  --  --   Minutes  15  --  --  --  --   METs  3  --  --  --  --     Home Exercise Plan   Plans to continue exercise at  Home (comment) walking, rec bike  Home (comment) walking, rec bike  Home (comment) walking, rec bike  Home (comment) walking, rec bike  Home (comment) walking, rec bike   Frequency  Add 2 additional days to program exercise sessions.  Add 2 additional days to program exercise sessions.  Add 2 additional days to program exercise sessions.  Add 2 additional days to program exercise sessions.  Add 2 additional days to program exercise sessions.   Initial Home Exercises Provided  03/29/19  03/29/19  03/29/19  03/29/19  03/29/19   Row Name 06/19/19 1500             Response to Exercise   Blood Pressure (Admit)  138/82       Blood Pressure (Exercise)  142/82       Blood Pressure (Exit)  134/82       Heart Rate (Admit)  48 bpm       Heart Rate (Exercise)  100 bpm       Heart Rate (Exit)  84 bpm       Rating of Perceived Exertion (Exercise)  12       Symptoms  none       Duration  Continue with 30 min of aerobic exercise without signs/symptoms of physical distress.       Intensity  THRR unchanged         Progression   Progression  Continue to progress  workloads to maintain intensity without signs/symptoms of physical distress.       Average METs  3.5         Resistance Training   Training Prescription  Yes       Weight  5 lb       Reps  10-15         Interval Training   Interval Training  No         Recumbant Bike   Level  3       RPM  60       Minutes  15       METs  3.5         NuStep   Level  5       SPM  80       Minutes  15       METs  3.6          Exercise Comments: Exercise Comments    Row Name 07/02/19 1441           Exercise Comments  Wyley graduated today from  rehab with 36 sessions completed.  Details of the patient's exercise prescription and what He needs to do in order to continue the prescription and progress were discussed with patient.  Patient was given a copy of prescription and goals.  Patient verbalized understanding.  Ayce plans to continue to exercise by walking at home.          Exercise Goals and Review: Exercise Goals    Row Name 03/06/19 1538             Exercise Goals   Increase Physical Activity  Yes       Intervention  Provide advice, education, support and counseling about physical activity/exercise needs.;Develop an individualized exercise prescription for aerobic and resistive training based on initial evaluation findings, risk stratification, comorbidities and participant's personal goals.       Expected Outcomes  Short Term: Attend rehab on a regular basis to increase amount of physical activity.;Long Term: Add in home exercise to make exercise part of routine and to increase amount of physical activity.;Long Term: Exercising regularly at least 3-5 days a week.       Increase Strength and Stamina  Yes       Intervention  Provide advice, education, support and counseling about physical activity/exercise needs.;Develop an individualized exercise prescription for aerobic and resistive training based on initial evaluation findings, risk stratification, comorbidities and participant's  personal goals.       Expected Outcomes  Short Term: Increase workloads from initial exercise prescription for resistance, speed, and METs.;Short Term: Perform resistance training exercises routinely during rehab and add in resistance training at home;Long Term: Improve cardiorespiratory fitness, muscular endurance and strength as measured by increased METs and functional capacity (6MWT)       Able to understand and use rate of perceived exertion (RPE) scale  Yes       Intervention  Provide education and explanation on how to use RPE scale       Expected Outcomes  Short Term: Able to use RPE daily in rehab to express subjective intensity level;Long Term:  Able to use RPE to guide intensity level when exercising independently       Able to understand and use Dyspnea scale  Yes       Intervention  Provide education and explanation on how to use Dyspnea scale       Expected Outcomes  Short Term: Able to use Dyspnea scale daily in rehab to express subjective sense of shortness of breath during exertion;Long Term: Able to use Dyspnea scale to guide intensity level when exercising independently       Knowledge and understanding of Target Heart Rate Range (THRR)  Yes       Intervention  Provide education and explanation of THRR including how the numbers were predicted and where they are located for reference       Expected Outcomes  Short Term: Able to state/look up THRR;Short Term: Able to use daily as guideline for intensity in rehab;Long Term: Able to use THRR to govern intensity when exercising independently       Able to check pulse independently  Yes       Intervention  Provide education and demonstration on how to check pulse in carotid and radial arteries.;Review the importance of being able to check your own pulse for safety during independent exercise       Expected Outcomes  Short Term: Able to explain why pulse checking is important during independent exercise;Long Term: Able to check pulse  independently and accurately       Understanding of Exercise Prescription  Yes       Intervention  Provide education, explanation, and written materials on patient's individual exercise prescription       Expected Outcomes  Short Term: Able to explain program exercise prescription;Long Term: Able to explain home exercise prescription to exercise independently          Exercise Goals Re-Evaluation : Exercise Goals Re-Evaluation    Row Name 03/08/19 1624 03/15/19 0743 03/28/19 1809 03/29/19 1513 04/10/19 1557     Exercise Goal Re-Evaluation   Exercise Goals Review  Increase Physical Activity;Able to understand and use rate of perceived exertion (RPE) scale;Knowledge and understanding of Target Heart Rate Range (THRR);Understanding of Exercise Prescription;Increase Strength and Stamina;Able to check pulse independently  Increase Physical Activity;Increase Strength and Stamina;Understanding of Exercise Prescription  Increase Physical Activity;Increase Strength and Stamina;Able to understand and use rate of perceived exertion (RPE) scale;Able to understand and use Dyspnea scale;Knowledge and understanding of Target Heart Rate Range (THRR);Able to check pulse independently;Understanding of Exercise Prescription  Increase Physical Activity;Increase Strength and Stamina;Able to understand and use rate of perceived exertion (RPE) scale;Able to understand and use Dyspnea scale;Knowledge and understanding of Target Heart Rate Range (THRR);Able to check pulse independently;Understanding of Exercise Prescription  Increase Physical Activity;Increase Strength and Stamina;Understanding of Exercise Prescription   Comments  Reviewed RPE scale, THR and program prescription with pt today.  Pt voiced understanding and was given a copy of goals to take home.  Tesean is off to a good start in rehab.  He has completed his first two full days of exercise.  He was only able to get 8 min on the treadmill.  We will continue to work  with him on this.  We will continue to monitor  his progress.  Armas has progressed to level 4 on NS and level 3 on Biostep.  Staff will monitor progress.  Reviewed home exercise with pt today.  Pt plans to walk and use bike at home for exercise.  Also talked about using staff videos. Reviewed THR, pulse, RPE, sign and symptoms, NTG use, and when to call 911 or MD.  Also discussed weather considerations and indoor options.  Pt voiced understanding.  Eyoel is doing well in rehab.  He is up to level 4 on the NuStep. We will continue to montior his progress.   Expected Outcomes  Short: Use RPE daily to regulate intensity. Long: Follow program prescription in THR.  Short: Continue to attend rehab regularly  Long: Continue to improve stamina.  Short : continue to build stamina on TM Long:  improve overall MET level  Short: Start to add in walking at home.  Long: Continue to improve stamina.  Short: Increase weights  Long: Continue to improve stamina.   [place] Name 04/25/19 1402 05/10/19 1200 05/21/19 1739 05/28/19 1517 06/04/19 0905     Exercise Goal Re-Evaluation   Exercise Goals Review  Increase Physical Activity;Increase Strength and Stamina;Understanding of Exercise Prescription  Increase Physical Activity;Increase Strength and Stamina;Understanding of Exercise Prescription  Increase Physical Activity;Increase Strength and Stamina;Able  to understand and use rate of perceived exertion (RPE) scale;Able to understand and use Dyspnea scale;Knowledge and understanding of Target Heart Rate Range (THRR);Able to check pulse independently;Understanding of Exercise Prescription  Increase Physical Activity;Increase Strength and Stamina  Increase Physical Activity;Increase Strength and Stamina;Understanding of Exercise Prescription   Comments  Jerimah has been doing well in rehab.  His back continues to give him difficulty on the treadmill.  Some days he is able to work through it, others he prefers to just use the seated  equipment.  We will continue to monitor his progress.  Dak continues to do well in rehab.  His back still limits his time on the treadmill.  He is doing well in the earlier class this month, but will switch back in May.  We will continue to monitor his progress.  Jed attends consistently.  The TM is still challenging.  He is at RPE of 10-11 on XR so staff will increase workload.  Patient is doing well in [facility] He wants to get to a point where he is not as winded when exercising. He has a stationary bike and walks at home. Informed him to continue exercise at home and continue when he is done with the program.  Oma is doing well in rehab.  He was actually able to walk for the full 20 min (includes warm up) on the treadmill last week.  His back was hurting but not to the point to make him stop. He is up to level 4 on the XR at 2.8 METs.  We will continue to monitor his progress.   Expected Outcomes  Short: Take advantage of treadmill on good days  Long: Continue to improve stamina.  Short: Continue to work with back pain.  Long; Continue to improve stamina.  Short: increase workloads on seated equipment Long: improve stamina  Short: continue to increase loads and become less short of breath. Long: maintain workloads and exercise independently  Short: Continue to take advantage of good back days with treadmill  Long: Continue to improve stamina.   [place] Name 06/18/19 1041             Exercise Goal Re-Evaluation   Exercise Goals Review  Increase Physical Activity;Increase Strength and Stamina;Able to understand and use rate of perceived exertion (RPE) scale;Able to understand and use Dyspnea scale;Knowledge and understanding of Target Heart Rate Range (THRR);Able to check pulse independently;Understanding of Exercise Prescription       Comments  Everette has progressed to level 5 on NS.  He works at 1.7 mph and 4% incline on TM.       Expected Outcomes  Short:  complete HT program Long: maintain fitness on  his own          Discharge Exercise Prescription (Final Exercise Prescription Changes): Exercise Prescription Changes - 06/19/19 1500      Response to Exercise   Blood Pressure (Admit)  138/82    Blood Pressure (Exercise)  142/82    Blood Pressure (Exit)  134/82    Heart Rate (Admit)  48 bpm    Heart Rate (Exercise)  100 bpm    Heart Rate (Exit)  84 bpm    Rating of Perceived Exertion (Exercise)  12    Symptoms  none    Duration  Continue with 30 min of aerobic exercise without signs/symptoms of physical distress.    Intensity  THRR unchanged      Progression   Progression  Continue to progress workloads to maintain  intensity without signs/symptoms of physical distress.    Average METs  3.5      Resistance Training   Training Prescription  Yes    Weight  5 lb    Reps  10-15      Interval Training   Interval Training  No      Recumbant Bike   Level  3    RPM  60    Minutes  15    METs  3.5      NuStep   Level  5    SPM  80    Minutes  15    METs  3.6       Nutrition:  Target Goals: Understanding of nutrition guidelines, daily intake of sodium <1540m, cholesterol <2042m calories 30% from fat and 7% or less from saturated fats, daily to have 5 or more servings of fruits and vegetables.  Education: Controlling Sodium/Reading Food Labels -Group verbal and written material supporting the discussion of sodium use in heart healthy nutrition. Review and explanation with models, verbal and written materials for utilization of the food label.   Education: General Nutrition Guidelines/Fats and Fiber: -Group instruction provided by verbal, written material, models and posters to present the general guidelines for heart healthy nutrition. Gives an explanation and review of dietary fats and fiber.   Biometrics: Pre Biometrics - 03/06/19 1606      Pre Biometrics   Height  5' 7.75" (1.721 m)    Weight  213 lb 3.2 oz (96.7 kg)    BMI (Calculated)  32.65    Single Leg  Stand  3.19 seconds        Nutrition Therapy Plan and Nutrition Goals: Nutrition Therapy & Goals - 04/19/19 1530      Nutrition Therapy   Diet  HH, Low NA, DM    Protein (specify units)  80-85g    Fiber  30 grams    Whole Grain Foods  3 servings    Saturated Fats  12 max. grams    Fruits and Vegetables  5 servings/day    Sodium  1.5 grams      Personal Nutrition Goals   Nutrition Goal  ST: continue with current changes  LT: increase fitness    Comments  Beef has given him problems. No breakfast, L: BLT or chicken/fish baked sandwich baked made himself D: chicken, sweet potato, salad (baked) - will have frozen meals occasionally. Coke zero or water (alternating). Whole wheat when he can. uses olive oil. A1c is now 8 went down from 14. Discussed HH and DM friendly eating. Pt would like to continue with current changes.      Intervention Plan   Intervention  Prescribe, educate and counsel regarding individualized specific dietary modifications aiming towards targeted core components such as weight, hypertension, lipid management, diabetes, heart failure and other comorbidities.;Nutrition handout(s) given to patient.    Expected Outcomes  Short Term Goal: Understand basic principles of dietary content, such as calories, fat, sodium, cholesterol and nutrients.;Short Term Goal: A plan has been developed with personal nutrition goals set during dietitian appointment.;Long Term Goal: Adherence to prescribed nutrition plan.       Nutrition Assessments: Nutrition Assessments - 03/06/19 1607      MEDFICTS Scores   Pre Score  42       MEDIFICTS Score Key:          ?70 Need to make dietary changes          40-70 Heart Healthy  Diet         ? 40 Therapeutic Level Cholesterol Diet  Nutrition Goals Re-Evaluation: Nutrition Goals Re-Evaluation    [place] Name 06/07/19 1514             Goals   Nutrition Goal  ST: continue with current changes  LT: increase fitness       Comment  Pt would  like to continue with current changes       Expected Outcome  ST: continue with current changes  LT: increase fitness          Nutrition Goals Discharge (Final Nutrition Goals Re-Evaluation): Nutrition Goals Re-Evaluation - 06/07/19 1514      Goals   Nutrition Goal  ST: continue with current changes  LT: increase fitness    Comment  Pt would like to continue with current changes    Expected Outcome  ST: continue with current changes  LT: increase fitness       Psychosocial: Target Goals: Acknowledge presence or absence of significant depression and/or stress, maximize coping skills, provide positive support system. Participant is able to verbalize types and ability to use techniques and skills needed for reducing stress and depression.   Education: Depression - Provides group verbal and written instruction on the correlation between heart/lung disease and depressed mood, treatment options, and the stigmas associated with seeking treatment.   Education: Sleep Hygiene -Provides group verbal and written instruction about how sleep can affect your health.  Define sleep hygiene, discuss sleep cycles and impact of sleep habits. Review good sleep hygiene tips.     Education: Stress and Anxiety: - Provides group verbal and written instruction about the health risks of elevated stress and causes of high stress.  Discuss the correlation between heart/lung disease and anxiety and treatment options. Review healthy ways to manage with stress and anxiety.    Initial Review & Psychosocial Screening: Initial Psych Review & Screening - 02/28/19 1109      Initial Review   Current issues with  History of Depression;Current Psychotropic Meds;Current Stress Concerns    Source of Stress Concerns  Chronic Illness    Comments  No current symptoms of depression on Zoloft and feels that it is working well for him, significant health history      [place]?  Yes   wife      Barriers   Psychosocial barriers to participate in program  The patient should benefit from training in stress management and relaxation.;Psychosocial barriers identified (see note)      Screening Interventions   Interventions  Encouraged to exercise;To provide support and resources with identified psychosocial needs;Provide feedback about the scores to participant    Expected Outcomes  Long Term Goal: Stressors or current issues are controlled or eliminated.;Short Term goal: Identification and review with participant of any Quality of Life or Depression concerns found by scoring the questionnaire.;Short Term goal: Utilizing psychosocial counselor, staff and physician to assist with identification of specific Stressors or current issues interfering with healing process. Setting desired goal for each stressor or current issue identified.;Long Term goal: The participant improves quality of Life and PHQ9 Scores as seen by post scores and/or verbalization of changes       Quality of Life Scores:  Quality of Life - 07/02/19 1522      Quality of Life   Select  Quality of Life      Quality of Life Scores   Health/Function Pre  20.57 %  Health/Function Post  28 %    Health/Function % Change  36.12 %    Socioeconomic Pre  27 %    Socioeconomic Post  30 %    Socioeconomic % Change   11.11 %    Psych/Spiritual Pre  25.5 %    Psych/Spiritual Post  30 %    Psych/Spiritual % Change  17.65 %    Family Pre  30 %    Family Post  30 %    Family % Change  0 %    GLOBAL Pre  24.41 %    GLOBAL Post  29.09 %    GLOBAL % Change  19.17 %      Scores of 19 and below usually indicate a poorer quality of life in these areas.  A difference of  2-3 points is a clinically meaningful difference.  A difference of 2-3 points in the total score of the Quality of Life Index has been associated with significant improvement in overall quality of life, self-image, physical symptoms, and general health in studies  assessing change in quality of life.  PHQ-9: Recent Review Flowsheet Data    Depression screen [facility] 2/9 07/02/2019 03/06/2019 01/15/2019 12/04/2018 09/29/2018   Decreased Interest 0 0 0 0 0   Down, Depressed, Hopeless 0 0 0 0 0   PHQ - 2 Score 0 0 0 0 0   Altered sleeping 0 1 - - 0   Tired, decreased energy 0 0 - - 0   Change in appetite 0 0 - - 0   Feeling bad or failure about yourself  0 0 - - 0   Trouble concentrating 0 0 - - 0   Moving slowly or fidgety/restless 0 0 - - 0   Suicidal thoughts 0 0 - - 0   PHQ-9 Score 0 1 - - 0   Difficult doing work/chores Not difficult at all Not difficult at all - - Not difficult at all     Interpretation of Total Score  Total Score Depression Severity:  1-4 = Minimal depression, 5-9 = Mild depression, 10-14 = Moderate depression, 15-19 = Moderately severe depression, 20-27 = Severe depression   Psychosocial Evaluation and Intervention: Psychosocial Evaluation - 02/28/19 1115      Psychosocial Evaluation & Interventions   Interventions  Encouraged to exercise with the program and follow exercise prescription    Comments  Kemoni 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  He has a Estate manager/land agent in place.  He also has uncontrolled diabetes according to office notes, but feels that he is doing well overall.  His wife is his primary support system and they try not to have too many worries.  He is looking forward to getting his heart stronger and back in better shape overall.    Expected Outcomes  Short: Attend rehab to rebuild stamina again.  Long: Continue to cope well with his health.    Continue Psychosocial Services   Follow up required by staff       Psychosocial Re-Evaluation: Psychosocial Re-Evaluation    [place] Name 04/09/19 1535 05/02/19 1312 05/28/19 1519         Psychosocial Re-Evaluation   Current issues with  Current Stress Concerns  Current Stress Concerns   None Identified     Comments  Deans wife has had shoulder surgery and is helping taking care of her. He has back pain  and it is tough to care for her at times. Overall he has a good outlook on his health.  He can look to his wife, son and daughter for support.  Wacey is coming to HT at a different time due to his wifes PT.  He has no other stress concerns - he says he got rid of stress when he retired.  Patient states that he does not have any concerns with his mental health. He like to stay at home and is not interested in being around people. He is positive about his health and has a good hold on what he needs to do when he is done with the program.     Expected Outcomes  Short: Attend HeartTrack stress management education to decrease stress. Long: Maintain exercise Post HeartTrack to keep stress at a minimum.  Short: continue to attend HT Long: maintain positive attitude  Short: maintain a positive attitude in [place], Long: maintain a positive outlook on physical and mental health.     Interventions  Encouraged to attend Cardiac Rehabilitation for the exercise  --  Encouraged to attend Cardiac Rehabilitation for the exercise     Continue Psychosocial Services   Follow up required by staff  --  Follow up required by staff        Psychosocial Discharge (Final Psychosocial Re-Evaluation): Psychosocial Re-Evaluation - 05/28/19 1519      Psychosocial Re-Evaluation   Current issues with  None Identified    Comments  Patient states that he does not have any concerns with his mental health. He like to stay at home and is not interested in being around people. He is positive about his health and has a good hold on what he needs to do when he is done with the program.    Expected Outcomes  Short: maintain a positive attitude in [place], Long: maintain a positive outlook on physical and mental health.    Interventions  Encouraged to attend Cardiac Rehabilitation for the exercise    Continue  Psychosocial Services   Follow up required by staff       Vocational Rehabilitation: Provide vocational rehab assistance to qualifying candidates.   Vocational Rehab Evaluation & Intervention: Vocational Rehab - 02/28/19 1109      Initial Vocational Rehab Evaluation & Intervention   Assessment shows need for Vocational Rehabilitation  No       Education: Education Goals: Education classes will be provided on a variety of topics geared toward better understanding of heart health and risk factor modification. Participant will state understanding/return demonstration of topics presented as noted by education test scores.  Learning Barriers/Preferences: Learning Barriers/Preferences - 02/28/19 1108      Learning Barriers/Preferences   Learning Barriers  Sight   glasses   Learning Preferences  None       General Cardiac Education Topics:  AED/CPR: - Group verbal and written instruction with the use of models to demonstrate the basic use of the AED with the basic ABC's of resuscitation.   Anatomy & Physiology of the Heart: - Group verbal and written instruction and models provide basic cardiac anatomy and physiology, with the coronary electrical and arterial systems. Review of Valvular disease and Heart Failure   Cardiac Procedures: - Group verbal and written instruction to review commonly prescribed medications for heart disease. Reviews the medication, class of the drug, and side effects. Includes the steps to properly store meds and maintain the prescription regimen. (beta blockers and nitrates)   Cardiac Medications I: -  Group verbal and written instruction to review commonly prescribed medications for heart disease. Reviews the medication, class of the drug, and side effects. Includes the steps to properly store meds and maintain the prescription regimen.   Cardiac Medications II: -Group verbal and written instruction to review commonly prescribed medications for heart  disease. Reviews the medication, class of the drug, and side effects. (all other drug classes)    Go Sex-Intimacy & Heart Disease, Get SMART - Goal Setting: - Group verbal and written instruction through game format to discuss heart disease and the return to sexual intimacy. Provides group verbal and written material to discuss and apply goal setting through the application of the S.M.A.R.T. Method.   Other Matters of the Heart: - Provides group verbal, written materials and models to describe Stable Angina and Peripheral Artery. Includes description of the disease process and treatment options available to the cardiac patient.   Infection Prevention: - Provides verbal and written material to individual with discussion of infection control including proper hand washing and proper equipment cleaning during exercise session.   Cardiac Rehab from 03/06/2019 in [facility]  Date  03/06/19  Educator  [facility]  Instruction Review Code  1- Verbalizes Understanding      Falls Prevention: - Provides verbal and written material to individual with discussion of falls prevention and safety.   Cardiac Rehab from 03/06/2019 in [facility] Cardiac and Pulmonary Rehab  Date  03/06/19  Educator  [facility]  Instruction Review Code  1- Verbalizes Understanding      Other: -Provides group and verbal instruction on various topics (see comments)   Knowledge Questionnaire Score: Knowledge Questionnaire Score - 07/02/19 1522      Knowledge Questionnaire Score   Pre Score  24/26 Education Focus: Angina and Nurtition    Post Score  25/26       Core Components/Risk Factors/Patient Goals at Admission: Personal Goals and Risk Factors at Admission - 03/06/19 1608      Core Components/Risk Factors/Patient Goals on Admission    Weight Management  Yes;Weight Loss;Obesity    Intervention  Weight Management: Develop a combined nutrition and exercise program designed to reach desired caloric intake, while  maintaining appropriate intake of nutrient and fiber, sodium and fats, and appropriate energy expenditure required for the weight goal.;Weight Management: Provide education and appropriate resources to help participant work on and attain dietary goals.;Weight Management/Obesity: Establish reasonable short term and long term weight goals.;Obesity: Provide education and appropriate resources to help participant work on and attain dietary goals.    Admit Weight  213 lb 3.2 oz (96.7 kg)    Goal Weight: Short Term  208 lb (94.3 kg)    Goal Weight: Long Term  200 lb (90.7 kg)    Expected Outcomes  Long Term: Adherence to nutrition and physical activity/exercise program aimed toward attainment of established weight goal;Short Term: Continue to assess and modify interventions until short term weight is achieved;Weight Loss: Understanding of general recommendations for a balanced deficit meal plan, which promotes 1-2 lb weight loss per week and includes a negative energy balance of [phone] kcal/d;Understanding recommendations for meals to include 15-35% energy as protein, 25-35% energy from fat, 35-60% energy from carbohydrates, less than 267m of dietary cholesterol, 20-35 gm of total fiber daily;Understanding of distribution of calorie intake throughout the day with the consumption of 4-5 meals/snacks    Diabetes  Yes    Intervention  Provide education about signs/symptoms and action to take for hypo/hyperglycemia.;Provide education about  proper nutrition, including hydration, and aerobic/resistive exercise prescription along with prescribed medications to achieve blood glucose in normal ranges: Fasting glucose 65-99 mg/dL    Expected Outcomes  Short Term: Participant verbalizes understanding of the signs/symptoms and immediate care of hyper/hypoglycemia, proper foot care and importance of medication, aerobic/resistive exercise and nutrition plan for blood glucose control.;Long Term: Attainment of HbA1C < 7%.     Heart Failure  Yes    Intervention  Provide a combined exercise and nutrition program that is supplemented with education, support and counseling about heart failure. Directed toward relieving symptoms such as shortness of breath, decreased exercise tolerance, and extremity edema.    Expected Outcomes  Improve functional capacity of life;Short term: Attendance in program 2-3 days a week with increased exercise capacity. Reported lower sodium intake. Reported increased fruit and vegetable intake. Reports medication compliance.;Short term: Daily weights obtained and reported for increase. Utilizing diuretic protocols set by physician.;Long term: Adoption of self-care skills and reduction of barriers for early signs and symptoms recognition and intervention leading to self-care maintenance.    Hypertension  Yes    Intervention  Provide education on lifestyle modifcations including regular physical activity/exercise, weight management, moderate sodium restriction and increased consumption of fresh fruit, vegetables, and low fat dairy, alcohol moderation, and smoking cessation.;Monitor prescription use compliance.    Expected Outcomes  Short Term: Continued assessment and intervention until BP is < 140/30m HG in hypertensive participants. < 130/833mHG in hypertensive participants with diabetes, heart failure or chronic kidney disease.    Lipids  Yes    Intervention  Provide education and support for participant on nutrition & aerobic/resistive exercise along with prescribed medications to achieve LDL <7091mHDL >70m24m  Expected Outcomes  Short Term: Participant states understanding of desired cholesterol values and is compliant with medications prescribed. Participant is following exercise prescription and nutrition guidelines.;Long Term: Cholesterol controlled with medications as prescribed, with individualized exercise RX and with personalized nutrition plan. Value goals: LDL < 70mg70mL > 40 mg.        Education:Diabetes - Individual verbal and written instruction to review signs/symptoms of diabetes, desired ranges of glucose level fasting, after meals and with exercise. Acknowledge that pre and post exercise glucose checks will be done for 3 sessions at entry of program.   Education: Know Your Numbers and Risk Factors: -Group verbal and written instruction about important numbers in your health.  Discussion of what are risk factors and how they play a role in the disease process.  Review of Cholesterol, Blood Pressure, Diabetes, and BMI and the role they play in your overall health.   Core Components/Risk Factors/Patient Goals Review:  Goals and Risk Factor Review    Row Name 04/09/19 1538 05/02/19 1309 05/28/19 1521         Core Components/Risk Factors/Patient Goals Review   Personal Goals Review  Weight Management/Obesity;Diabetes;Lipids;Hypertension;Heart Failure  --  Weight Management/Obesity;Diabetes;Heart Failure;Hypertension;Lipids     Review  Gerrard Lorenaking his medications properly and has been to the doctors recently for his diabetes. His A1C went from an 11 to a 10. Macoy Wandano questions about his medications at this time. He is checking his sugar two times a day, one time in the morning and one time at night.  Joziyah Kmarion check BG at home but hasnt today.  He is taking all meds as directed.  He states hes not getting winded as quick with ADLs.  Cornelius Ulestill wanting to work on losing weight  in the program. He does check his blood sugar at home regularly. His blood pressure has been doing good and he has just halved his amilodipine dosage.     Expected Outcomes  Short: Exercise and take medications as prescribed to help with overall health. Long: maintain exercise and medications independently to improve upon overall health.  Short: continue to exercise and monitor sugars Long: manage risk factors  Short: continue to exercise for weight loss and blood pressure reading. Long:  maintain blood pressure and more weight loss independently        Core Components/Risk Factors/Patient Goals at Discharge (Final Review):  Goals and Risk Factor Review - 05/28/19 1521      Core Components/Risk Factors/Patient Goals Review   Personal Goals Review  Weight Management/Obesity;Diabetes;Heart Failure;Hypertension;Lipids    Review  [place] is still wanting to work on losing weight in the program. He does check his blood sugar at home regularly. His blood pressure has been doing good and he has just halved his amilodipine dosage.    Expected Outcomes  Short: continue to exercise for weight loss and blood pressure reading. Long: maintain blood pressure and more weight loss independently       ITP Comments: ITP Comments    Row Name 02/28/19 1120 03/06/19 1526 03/08/19 1622 03/21/19 0704 04/18/19 1026   ITP Comments  Completed virtual orientation today.  EP eval scheduled for 2/9 at 2pm. Documentation can be found in [facility] encounter 01/08/19.  Completed 6MWT and gym orientation.  Initial ITP created and sent for review to Dr. Emily Filbert, Medical Director.  First full day of exercise!  Patient was oriented to gym and equipment including functions, settings, policies, and procedures.  Patient's individual exercise prescription and treatment plan were reviewed.  All starting workloads were established based on the results of the 6 minute walk test done at initial orientation visit.  The plan for exercise progression was also introduced and progression will be customized based on patient's performance and goals.  30 day chart review completed. ITP sent to Dr Zachery Dakins Medical Director, for review,changes as needed and signature.  30 day chart review completed. ITP sent to Dr Zachery Dakins Medical Director, for review,changes as needed and signature.   [place] Name 05/16/19 0620 06/13/19 0650         ITP Comments  30 Day review completed. Medical Director review done, changes made as  directed,and approval shown by signature of Market researcher.  30 Day review completed. ITP review done, changes made as directed,and approval shown by signature of  Scientist, research (life sciences).         Comments: discharge ITP

## 2019-07-02 NOTE — Progress Notes (Signed)
Daily Session Note  Patient Details  Name: Martin Bush MRN: [id number] Date of Birth: 09/29/1955 Referring Provider:     Cardiac Rehab from 03/06/2019 in [facility]  Referring Provider  Martin Prows MD      Encounter Date: 07/02/2019  Check In: Session Check In - 07/02/19 1440      Check-In   Supervising physician immediately available to respond to emergencies  See telemetry face sheet for immediately available ER MD    Location  [facility]-Cardiac & Pulmonary Rehab    Staff Present  Martin Bush, BS, ACSM CEP, Exercise Physiologist;Martin Eliezer Bottom, MS Exercise Physiologist;Martin Linward Natal, RN, BSN    Virtual Visit  No    Medication changes reported      No    Fall or balance concerns reported     No    Warm-up and Cool-down  Performed on first and last piece of equipment    Resistance Training Performed  Yes    VAD Patient?  No    PAD/SET Patient?  No      Pain Assessment   Currently in Pain?  No/denies          Social History   Tobacco Use  Smoking Status Former Smoker  . Packs/day: 0.25  . Years: 27.00  . Pack years: 6.75  . Types: Cigars  . Quit date: 2002  . Years since quitting: 19.4  Smokeless Tobacco Former Systems developer  . Quit date: 2002    Goals Met:  Independence with exercise equipment Exercise tolerated well Personal goals reviewed No report of cardiac concerns or symptoms Strength training completed today  Goals Unmet:  Not Applicable  Comments:  Martin Bush graduated today from  rehab with 36 sessions completed.  Details of the patient's exercise prescription and what He needs to do in order to continue the prescription and progress were discussed with patient.  Patient was given a copy of prescription and goals.  Patient verbalized understanding.  Martin Bush plans to continue to exercise by walking at home.    Dr. Emily Bush is Medical Director for [place] [facility].

## 2019-07-03 ENCOUNTER — Encounter: Payer: Self-pay | Admitting: *Deleted

## 2019-07-03 ENCOUNTER — Other Ambulatory Visit: Payer: Self-pay | Admitting: *Deleted

## 2019-07-03 NOTE — Patient Outreach (Signed)
[facility]) Care Management [facility] CM Telephone Outreach Post-hospital discharge day # 173 without unplanned hospital re-admission Unsuccessful (consecutive) outreach attempt # 1- previously engaged patient  07/03/2019  Nery Frappier 1955-06-05 [id number]  Unsuccessful telephone outreach to [name], 64 y/o male referred 01/12/2019 to [place] by [facility] Liaison after recent hospitalization December 9- 17, 2020 for acute pulmonary edema/ NSTEMI with native vessel disease and stent re-stenosis; patient had cardiac catherization 01/08/2019 and eventual PCI/ angioplasty.Patient was discharged home to self care withouthome health services in place. 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  HIPAA compliant voice mail message left for patient, requesting return call back.  Plan:  Will re-attempt [facility] CM telephone outreach within 4 business days if I do not hear back from patient first.   Oneta Rack, RN, BSN, [place] Coordinator [facility] Care Management  [phone]

## 2019-07-04 ENCOUNTER — Encounter: Payer: Self-pay | Admitting: *Deleted

## 2019-07-04 ENCOUNTER — Other Ambulatory Visit: Payer: Self-pay | Admitting: *Deleted

## 2019-07-04 NOTE — Patient Outreach (Signed)
[facility]) Care Management [facility] CM Telephone Outreach Post-hospital discharge day # 174 without unplanned hospital re-admission Unsuccessful (consecutive) outreach attempt # 2- previously engaged patient  07/04/2019  Martin Bush 12/17/1955 [id number]  10:15 am: Unsuccessful telephone outreach to Martin Bush, 64 y/o male referred 01/12/2019 to [place] by [facility] Liaison after hospitalization December 9- 17, 2020 for acute pulmonary edema/ NSTEMI with native vessel disease and stent re-stenosis; patient had cardiac catherization 01/08/2019 and eventual PCI/ angioplasty.Patient was discharged home to self care withouthome health services in place. 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  HIPAA compliant voice mail message left for patient, requesting return call back.  Plan:  Will place [facility] CM unsuccessful patient outreach letter in mail requesting call back in writing, as this is the second consecutive unsuccessful outreach attempt with previously engaged patient  Will re-attempt [facility] CM telephone outreach later within 4 business days if I do not hear back from patient first.  Oneta Rack, RN, BSN, [place] Care Management  [phone]

## 2019-07-06 ENCOUNTER — Other Ambulatory Visit: Payer: Self-pay | Admitting: *Deleted

## 2019-07-06 ENCOUNTER — Encounter: Payer: Self-pay | Admitting: *Deleted

## 2019-07-06 NOTE — Patient Outreach (Signed)
[place] [facility]) Care Management [facility] CM Telephone Outreach Post-hospital discharge day # 176 without hospital re-admission Unsuccessful (consecutive) outreach attempt # 3- previously engaged patient  07/06/2019  Martin Bush April 24, 1955 [id number]  Unsuccessful consecutive third telephone outreach attempt to [name], 64 y/o male referred 01/12/2019 to [place] by [facility] Liaison after recent hospitalization December 9- 17, 2020 for acute pulmonary edema/ NSTEMI with native vessel disease and stent re-stenosis; patient had cardiac catherization 01/08/2019 and eventual PCI/ angioplasty.Patient was discharged home to self care withouthome health services in place. 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  HIPAA compliant voice mail message left for patient, requesting return call back.  Plan:  Verified [facility] CM unsuccessful patient outreach letter placed in mail July 04, 2019, requesting call back in writing  Will re-attempt [facility] CM telephone outreach again in 3 weeks week if I do not hear back from patient first.  Oneta Rack, RN, BSN, CCRN [facility] Care Management  [phone]

## 2019-07-08 DIAGNOSIS — I5043 Acute on chronic combined systolic (congestive) and diastolic (congestive) heart failure: Secondary | ICD-10-CM | POA: Diagnosis not present

## 2019-07-19 ENCOUNTER — Ambulatory Visit: Payer: No Typology Code available for payment source | Admitting: Cardiology

## 2019-07-25 DIAGNOSIS — I5022 Chronic systolic (congestive) heart failure: Secondary | ICD-10-CM | POA: Diagnosis not present

## 2019-07-26 ENCOUNTER — Telehealth: Payer: Self-pay

## 2019-07-27 ENCOUNTER — Encounter: Payer: Self-pay | Admitting: *Deleted

## 2019-07-27 ENCOUNTER — Other Ambulatory Visit: Payer: Self-pay | Admitting: *Deleted

## 2019-07-27 NOTE — Patient Outreach (Signed)
[place] [facility]) Care Management [facility] CM Telephone Outreach, Case Closure  07/27/2019  Julious Langlois [birth year] [id number]  Successful telephone outreach to [name], 64 y/o male referred 01/12/2019 to [place] by [facility] Liaison after recent hospitalization December 9- 17, 2020 for acute pulmonary edema/ NSTEMI with native vessel disease and stent re-stenosis; patient had cardiac catherization 01/08/2019 and eventual PCI/ angioplasty.Patient was discharged home to self care withouthome health services in place. 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  Today is the first successful outreach with patient since Jun 05, 2019 and represents the fourth call attempt after 3 consecutive unsuccessful previous attempts.  HIPAA/ identity verified with patient; he reports "doing great," and states that he has been traveling and is getting ready to leave again "shortly" to continue his travel for "the next 3 or 4 months;" reports he plans to travel to [place], [place], and [place].  He tells me he is doing "great," and "having no problems or concerns" and would like to stop his ongoing participation in [place] program due to his upcoming travels as he can not commit to maintaining contact with [facility] CM team.    Discussed with patient that he is welcome to re-contact me or the [place] office to re-engage once he returns home from his travels, and he confirms that he has my direct phone number and contact information for [facility] CM.  Patient declines proactive call after he returns, states he "will call us" if he decides he would like to re-engage with [facility] CM program.  Plan:  Will make patient inactive with [facility] CM as he has declined ongoing [facility] CM follow up and will make patient's PCP aware of same.  Oneta Rack, RN, BSN, [employer] [facility] [facility] Care Management  [phone]

## 2019-08-07 DIAGNOSIS — I5043 Acute on chronic combined systolic (congestive) and diastolic (congestive) heart failure: Secondary | ICD-10-CM | POA: Diagnosis not present

## 2019-08-07 LAB — HEMOGLOBIN A1C: Hemoglobin A1C: 12.6

## 2019-08-08 IMAGING — DX DG CHEST 2V
2 series · 2 of 2 positions shown · non-contrast
Comparison: 09/01/2017

CLINICAL DATA: Chest pain today, dizziness, history coronary
coronary artery disease post stenting 2 months ago, CHF, type II
diabetes mellitus, NSTEMI, essential hypertension

EXAM:
CHEST - 2 VIEW

[w chest pa]
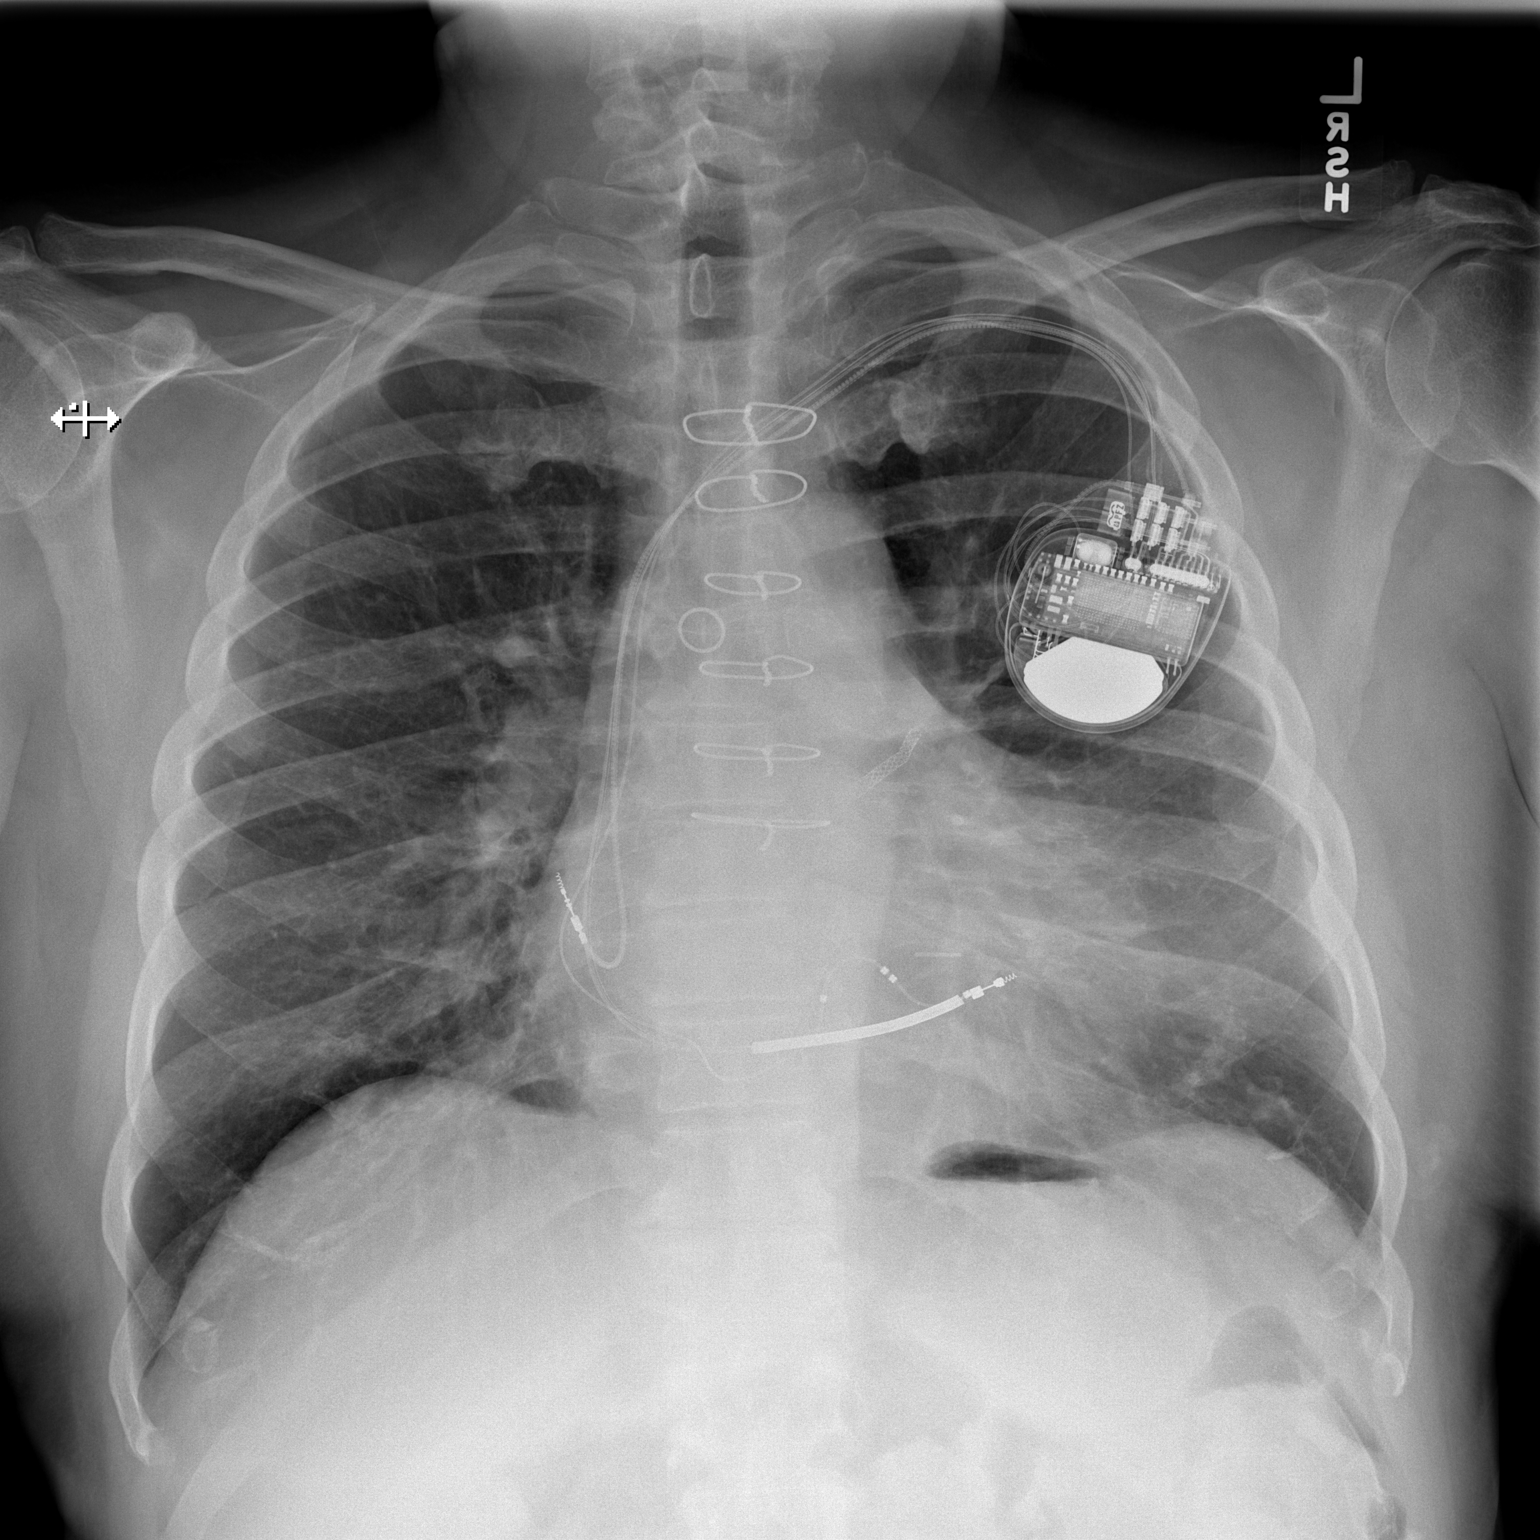

[w chest lat]
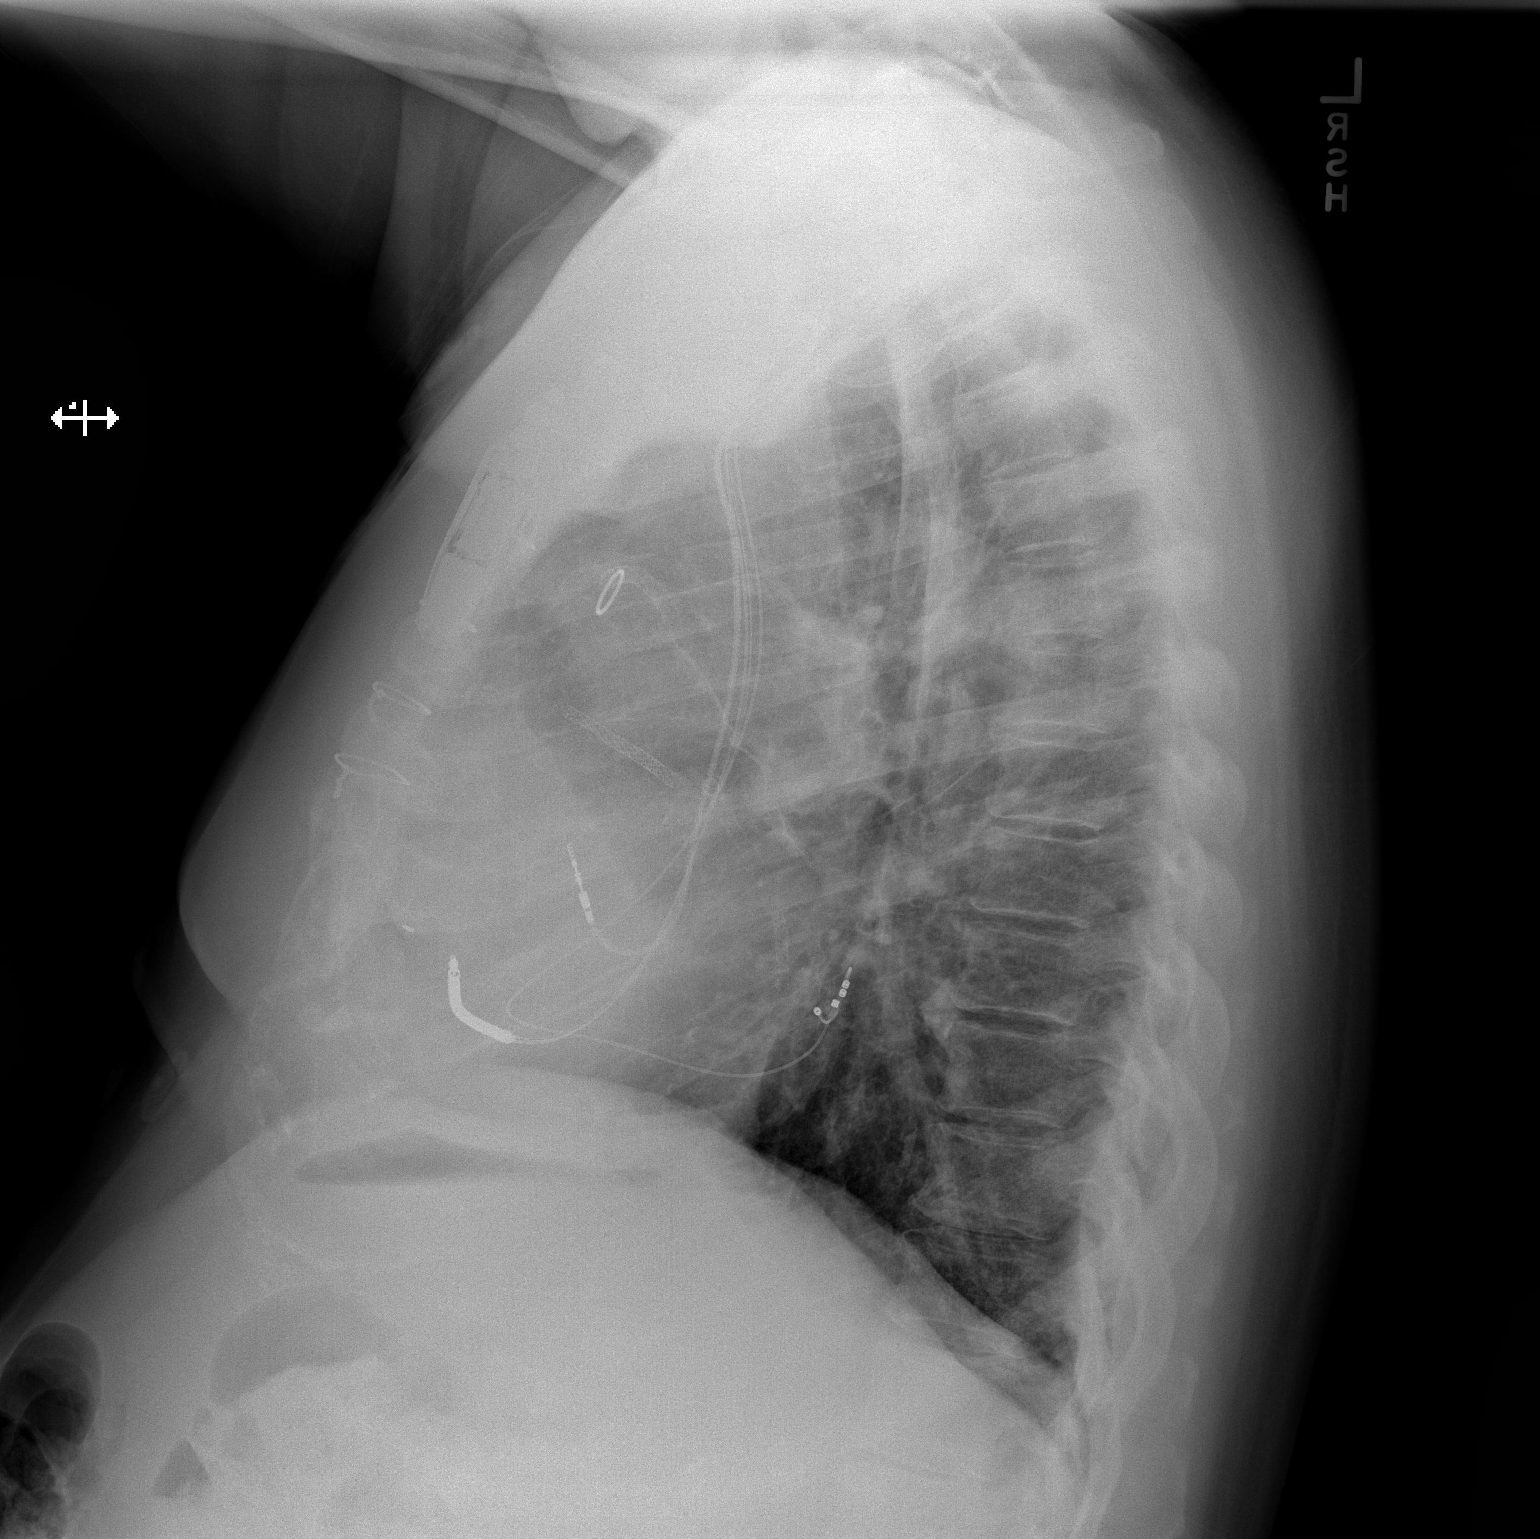

[2 of 2 positions shown; findings below may reference images not displayed]

FINDINGS: LEFT subclavian pacemaker/AICD leads project at RIGHT atrium, RIGHT
ventricle and coronary sinus.

Enlargement of cardiac silhouette post CABG and PTCA with stenting.

Mediastinal contours and pulmonary vascularity normal.

Lungs clear.

No infiltrate, pleural effusion or pneumothorax.

Bones unremarkable.
IMPRESSION: Enlargement of cardiac silhouette post AICD, CABG and PTCA with
stenting.

No acute abnormalities.

## 2019-08-22 ENCOUNTER — Telehealth: Payer: Self-pay | Admitting: Gastroenterology

## 2019-08-22 NOTE — Telephone Encounter (Signed)
Plan for a 16-month follow-up flex sig with EMR

## 2019-08-22 NOTE — Telephone Encounter (Signed)
Patient needs to be scheduled for recall colon at [facility] also has recall for Flex Sig at [facility] please call the wife to get scheduled

## 2019-08-23 ENCOUNTER — Telehealth: Payer: Self-pay | Admitting: Gastroenterology

## 2019-08-23 ENCOUNTER — Other Ambulatory Visit: Payer: Self-pay

## 2019-08-23 DIAGNOSIS — D128 Benign neoplasm of rectum: Secondary | ICD-10-CM

## 2019-08-23 NOTE — Telephone Encounter (Signed)
The pt appt has been cancelled and he will call back when he is ready to reschedule.

## 2019-08-23 NOTE — Telephone Encounter (Signed)
Patient called needs to cancel procedure at the hospital

## 2019-08-23 NOTE — Telephone Encounter (Signed)
The pt has been scheduled for Flex EMR on 09/03/19 at 730 am at [facility] with Dr Rush Landmark.  COVID test on 08/30/19 at 835 am.  The pt has been advised and instructed.  All information was sent to the pt My Chart per wife request.

## 2019-08-24 ENCOUNTER — Encounter: Payer: Self-pay | Admitting: Cardiology

## 2019-08-24 ENCOUNTER — Other Ambulatory Visit: Payer: Self-pay

## 2019-08-24 ENCOUNTER — Ambulatory Visit: Payer: No Typology Code available for payment source | Admitting: Cardiology

## 2019-08-24 VITALS — BP 101/56 | HR 64 | Resp 17 | Ht 67.0 in | Wt 219.0 lb

## 2019-08-24 DIAGNOSIS — Z95810 Presence of automatic (implantable) cardiac defibrillator: Secondary | ICD-10-CM

## 2019-08-24 DIAGNOSIS — I10 Essential (primary) hypertension: Secondary | ICD-10-CM

## 2019-08-24 DIAGNOSIS — I255 Ischemic cardiomyopathy: Secondary | ICD-10-CM

## 2019-08-24 DIAGNOSIS — I5042 Chronic combined systolic (congestive) and diastolic (congestive) heart failure: Secondary | ICD-10-CM

## 2019-08-24 NOTE — Progress Notes (Signed)
Primary Physician/Referring:  [name], MD  Patient ID: Martin Bush, male    DOB: [birth year], 64 y.o.   MRN: [id number]  Chief Complaint  Patient presents with  . Congestive Heart Failure   HPI:    Martin Bush  is a 64 y.o.  Caucasian male  with CAD and history of CABG x2 in 2002 with only LIMA to LAD being patent, Circumflex coronary artery is super dominant. H/O  Left main stenting in 2015 and in 2016 he had complex PCI to in-stent restenotic circumflex/OM bifurcation, due to recurrent restenosis, underwent stenting to left main and circumflex proximal and midsegment with implantation of 2 overlapping 3.5 x 20 and 3.0 x 20 mm  Synergy DES on 08/16/2017 when he presented with decompensated heart failure and recurrent VT.    Similar presentation on 01/03/2019, coronary angiography on 01/05/2019 revealing severely restenotic left main stent that extends into the circumflex proximal and also old mid circumflex stent and successful Wolverine 3.5 mm cutting balloon angioplasty on 01/11/19. He also had slow VT but stable hemodynamics at presentation. He is on chronic amiodarone.   He is feeling well and has not had any further dyspnea, palpitations or chest pain. Presents for a 3 month OV and is tolerating all his medications and anti platelet therapy without complications.    Past Medical History:  Diagnosis Date  . AICD (automatic cardioverter/defibrillator) present    Medtronic  . Arthritis    "thumbs"  (08/02/2017)  . CHF (congestive heart failure) ([place])   . Chronic combined systolic and diastolic heart failure ([place])    a. 08/2017 Echo: EF 20-25%, mod glob HK. Sev distal ant sept, inflat HK. Apical AK. Gr2 DD. Mildly reduced RV fxn.  . Colon polyps   . Coronary artery disease    a. s/p CABG x 2 (LIMA->LAD, VG->OM); b. Multiple PCI's to LM/LCX/OM; c. 07/2017 PTCA of LM/LCX w/ early ISR-->repeat PCI/DES to LM (3.5x20 Synergy DES) and LCX (3.0x20 Synergy DES); d. 07/2018 Relook Cath:  LM patent stent, LAD 100ost, LCX patent stent, OM1 99/60, LIMA->LAD ok. VG->dLCX 18 (old).  . Depression   . Encounter for assessment of implantable cardioverter-defibrillator (ICD) 09/27/2018  . High cholesterol   . Hypertension   . ICD; Biventricular  Medtronic ICD Amplia MRI QWuad CRT-D  in situ 10/29/14 10/29/2014   Remote ICD check 09.23.20:  One 6 beat NSVT. No therapy.  1 SVT episode @ 130 bpm (38 Sec).  There were 23 Vent sense episodes detected for up to 1.1 min/day (AT). Health trends (patient activity, heart rate variability, average heart rates) are stable.Trans-thoracic impedance trends and the OptiVol Fluid Index do no present significant abnormalities. Battery longevity is 4.3 years. RA pacing is 3  . Ischemic cardiomyopathy 09/27/2018  . MI (myocardial infarction) ([place]) 2003  . NSTEMI (non-ST elevated myocardial infarction) ([place]) 07/16/2014  . OSA on CPAP   . Oxygen deficiency   . Pneumonia 10/2014  . Proteinuria   . Sleep apnea   . Type II diabetes mellitus (HCC)    insulin dependent   Past Surgical History:  Procedure Laterality Date  . BIOPSY  09/19/2018   Procedure: BIOPSY;  Surgeon: Thornton Park, MD;  Location: [facility];  Service: Gastroenterology;;  . CARDIAC CATHETERIZATION N/A 07/16/2014   Procedure: Left Heart Cath and Coronary Angiography;  Surgeon: Adrian Prows, MD;  Location: [place] [facility];  Service: Cardiovascular;  Laterality: N/A;  . CARDIAC CATHETERIZATION  07/16/2014   Procedure: Coronary Balloon Angioplasty;  Surgeon: Adrian Prows, MD;  Location: [place] [facility];  Service: Cardiovascular;;  . CARDIAC CATHETERIZATION  2003  . CARDIAC DEFIBRILLATOR PLACEMENT  2016  . CATARACT EXTRACTION W/ INTRAOCULAR LENS IMPLANT Left 03/2014  . COLONOSCOPY    . COLONOSCOPY WITH PROPOFOL N/A 09/19/2018   Procedure: COLONOSCOPY WITH PROPOFOL;  Surgeon: Thornton Park, MD;  Location: [facility];  Service: Gastroenterology;  Laterality: N/A;  . CORONARY ANGIOPLASTY  WITH STENT PLACEMENT     "I've got a total of 8 stents in there; mostly doine at [facility]" (08/02/2017)  . CORONARY ARTERY BYPASS GRAFT  ~ 2003   "CABG X2"; [facility]  . CORONARY ATHERECTOMY N/A 08/16/2017   Procedure: CORONARY ATHERECTOMY;  Surgeon: Nigel Mormon, MD;  Location: [place] [facility];  Service: Cardiovascular;  Laterality: N/A;  . CORONARY BALLOON ANGIOPLASTY N/A 08/04/2017   Procedure: CORONARY BALLOON ANGIOPLASTY;  Surgeon: Adrian Prows, MD;  Location: [place] [facility];  Service: Cardiovascular;  Laterality: N/A;  . CORONARY BALLOON ANGIOPLASTY N/A 01/11/2019   Procedure: CORONARY BALLOON ANGIOPLASTY;  Surgeon: Adrian Prows, MD;  Location: [place] [facility];  Service: Cardiovascular;  Laterality: N/A;  . CORONARY STENT INTERVENTION N/A 08/16/2017   Procedure: CORONARY STENT INTERVENTION;  Surgeon: Nigel Mormon, MD;  Location: [place] [facility];  Service: Cardiovascular;  Laterality: N/A;  . ELBOW SURGERY Left ?2001   "pinched nerve"  . ENDOSCOPIC MUCOSAL RESECTION  12/18/2018   Procedure: ENDOSCOPIC MUCOSAL RESECTION;  Surgeon: Rush Landmark Telford Nab., MD;  Location: [place];  Service: Gastroenterology;;  . Otho Darner SIGMOIDOSCOPY N/A 12/18/2018   Procedure: Beryle Quant;  Surgeon: Irving Copas., MD;  Location: [place];  Service: Gastroenterology;  Laterality: N/A;  . HEMOSTASIS CLIP PLACEMENT  12/18/2018   Procedure: HEMOSTASIS CLIP PLACEMENT;  Surgeon: Irving Copas., MD;  Location: [place];  Service: Gastroenterology;;  . LEFT HEART CATH AND CORONARY ANGIOGRAPHY N/A 01/11/2019   Procedure: LEFT HEART CATH AND CORONARY ANGIOGRAPHY;  Surgeon: Adrian Prows, MD;  Location: [place] [facility];  Service: Cardiovascular;  Laterality: N/A;  . LEFT HEART CATH AND CORS/GRAFTS ANGIOGRAPHY N/A 08/04/2017   Procedure: LEFT HEART CATH AND CORS/GRAFTS ANGIOGRAPHY;  Surgeon: Adrian Prows, MD;  Location: [place] [facility];  Service:  Cardiovascular;  Laterality: N/A;  . LEFT HEART CATH AND CORS/GRAFTS ANGIOGRAPHY N/A 08/20/2017   Procedure: LEFT HEART CATH AND CORS/GRAFTS ANGIOGRAPHY;  Surgeon: Nigel Mormon, MD;  Location: [place] [facility];  Service: Cardiovascular;  Laterality: N/A;  . LEFT HEART CATH AND CORS/GRAFTS ANGIOGRAPHY N/A 01/08/2019   Procedure: LEFT HEART CATH AND CORS/GRAFTS ANGIOGRAPHY;  Surgeon: Wellington Hampshire, MD;  Location: [place] [facility];  Service: Cardiovascular;  Laterality: N/A;  . POLYPECTOMY  09/19/2018   Procedure: POLYPECTOMY;  Surgeon: Thornton Park, MD;  [facility];  Service: Gastroenterology;;  . Lia Foyer INJECTION  09/19/2018   Procedure: SUBMUCOSAL INJECTION;  Surgeon: Thornton Park, MD;  [facility]: [facility];  Service: Gastroenterology;;  . Lia Foyer LIFTING INJECTION  12/18/2018   Procedure: SUBMUCOSAL LIFTING INJECTION;  Surgeon: Irving Copas., MD;  Location: [facility];  Service: Gastroenterology;;    Family History  Problem Relation Age of Onset  . Uterine cancer Mother   . Lung cancer Mother   . Hyperlipidemia Father   . Heart disease Father   . Hypertension Father   . Diabetes Father   . Colon cancer Neg Hx   . Esophageal cancer Neg Hx   . Colon polyps Neg  Hx   . Rectal cancer Neg Hx   . Stomach cancer Neg Hx   . Inflammatory bowel disease Neg Hx   . Liver disease Neg Hx   . Pancreatic cancer Neg Hx    Social History   Tobacco Use  . Smoking status: Former Smoker    Packs/day: 0.25    Years: 27.00    Pack years: 6.75    Types: Cigars    Quit date: 2002    Years since quitting: 19.5  . Smokeless tobacco: Former Systems developer    Quit date: 2002  Substance Use Topics  . Alcohol use: Not Currently    Marital Status: Married ROS  Review of Systems  Cardiovascular: Positive for dyspnea on exertion. Negative for chest pain and leg swelling.  Gastrointestinal: Negative for melena.   Objective  Blood pressure (!) 101/56,  pulse 64, resp. rate 17, height 5\' 7"  (1.702 m), weight (!) 219 lb (99.3 kg), SpO2 95 %.  Vitals with BMI 08/24/2019 05/28/2019 04/16/2019  Height 5\' 7"  5\' 7"  5\' 7"   Weight 219 lbs 219 lbs 6 oz 221 lbs 8 oz  BMI 34.29 17.49 44.96  Systolic 759 163 846  Diastolic 56 72 73  Pulse 64 84 84     Physical Exam Vitals reviewed.  Constitutional:      Comments: He is moderately built and mildly obese in no acute distress.  Cardiovascular:     Rate and Rhythm: Normal rate and regular rhythm.     Pulses: Intact distal pulses.          Femoral pulses are 2+ on the right side and 2+ on the left side.      Popliteal pulses are 1+ on the right side and 1+ on the left side.       Dorsalis pedis pulses are 0 on the right side and 0 on the left side.       Posterior tibial pulses are 0 on the right side and 0 on the left side.     Heart sounds: Normal heart sounds.     Comments: No JVD.  No pedal edema. Pulmonary:     Effort: Pulmonary effort is normal. No accessory muscle usage or respiratory distress.     Breath sounds: Normal breath sounds.  Abdominal:     General: Bowel sounds are normal.     Palpations: Abdomen is soft.     Hernia: A hernia is present. Hernia is present in the ventral area (reducible).     Comments: obese    Laboratory examination:   Recent Labs    01/09/19 0546 01/11/19 0604 04/18/19 1019  NA 136 134* 135  K 4.7 4.7 4.7  CL 104 103 98  CO2 23 23 21   GLUCOSE 125* 120* 473*  BUN 32* 29* 31*  CREATININE 1.61* 1.49* 1.65*  CALCIUM 9.5 8.8* 9.5  GFRNONAA 45* 49* 44*  GFRAA 52* 57* 50*   CrCl cannot be calculated (Patient's most recent lab result is older than the maximum 21 days allowed.).  CMP Latest Ref Rng & Units 04/18/2019 01/11/2019 01/09/2019  Glucose 65 - 99 mg/dL 473(H) 120(H) 125(H)  BUN 8 - 27 mg/dL 31(H) 29(H) 32(H)  Creatinine 0.76 - 1.27 mg/dL 1.65(H) 1.49(H) 1.61(H)  Sodium 134 - 144 mmol/L 135 134(L) 136  Potassium 3.5 - 5.2 mmol/L 4.7 4.7 4.7    Chloride 96 - 106 mmol/L 98 103 104  CO2 20 - 29 mmol/L 21 23 23   Calcium 8.6 -  10.2 mg/dL 9.5 8.8(L) 9.5  Total Protein 6.0 - 8.5 g/dL 6.6 - -  Total Bilirubin 0.0 - 1.2 mg/dL 0.3 - -  Alkaline Phos 39 - 117 IU/L 116 - -  AST 0 - 40 IU/L 12 - -  ALT 0 - 44 IU/L 13 - -   CBC Latest Ref Rng & Units 01/11/2019 01/08/2019 01/08/2019  WBC 4.0 - 10.5 K/uL 7.9 7.5 8.0  Hemoglobin 13.0 - 17.0 g/dL 11.3(L) 12.6(L) 12.6(L)  Hematocrit 39 - 52 % 33.2(L) 37.9(L) 37.4(L)  Platelets 150 - 400 K/uL 181 198 200   Lipid Panel Recent Labs    09/28/18 1417 09/28/18 1417 10/30/18 1030 10/30/18 1030 12/27/18 0814 01/04/19 0640 04/18/19 1019  CHOL 156   < > 144   < > 103 142 127  TRIG 233.0*   < > 240.0*   < > 182.0* 148 181*  LDLCALC  --   --   --   --  34 67 58  VLDL 46.6*   < > 48.0*  --  36.4 30  --   HDL 37.60*   < > 39.60   < > 32.10* 45 39*  CHOLHDL 4   < > 4  --  3 3.2  --   LDLDIRECT 88.0  --  71.0  --   --   --   --    < > = values in this interval not displayed.    HEMOGLOBIN A1C Lab Results  Component Value Date   HGBA1C 12.1 (H) 04/18/2019   MPG 263 07/12/2015   TSH Recent Labs    04/18/19 1019  TSH 1.460   BNP    Component Value Date/Time   BNP 820.8 (H) 04/18/2019 1019   BNP 1,262.0 (H) 01/03/2019 2133    Medications and allergies   Allergies  Allergen Reactions  . Diltiazem Rash     Current Outpatient Medications  Medication Instructions  . acetaminophen (TYLENOL) 650 mg, Oral, Every 4 hours PRN  . amiodarone (PACERONE) 100 mg, Oral, Daily  . aspirin 81 mg, Oral, Daily  . carvedilol (COREG) 6.25 mg, Oral, 2 times daily with meals  . Cholecalciferol (VITAMIN D3) 50 MCG (2000 UT) TABS Oral, 2 times daily  . empagliflozin (JARDIANCE) 25 mg, Oral, Daily  . ezetimibe (ZETIA) 10 mg, Oral, Daily at bedtime  . Fish Oil 1,000 mg, Oral, 2 times daily  . furosemide (LASIX) 40 mg, Oral, Daily, Currently taking ~1/week  . insulin aspart protamine- aspart  (NOVOLOG MIX 70/30) (70-30) 100 UNIT/ML injection 25 Units, Subcutaneous, 2 times daily with meals  . isosorbide mononitrate (IMDUR) 30 mg, Oral, Daily  . liraglutide (VICTOZA) 1.8 mg, Subcutaneous, Daily  . metFORMIN (GLUCOPHAGE) 1,000 mg, Oral, 2 times daily with meals, Taking 2 tabs twice a day  . nitroGLYCERIN (NITROSTAT) 0.4 mg, Sublingual, Every 5 min x3 PRN  . potassium chloride SA (KLOR-CON) 20 MEQ tablet TAKE 1 TABLET BY MOUTH TWICE A DAY  . PRESCRIPTION MEDICATION Inhalation, Daily at bedtime, CPAP  . rosuvastatin (CRESTOR) 40 mg, Oral, Daily at bedtime  . sacubitril-valsartan (ENTRESTO) 97-103 MG 1 tablet, Oral, 2 times daily  . sertraline (ZOLOFT) 50 mg, Oral, Daily at bedtime  . ticagrelor (BRILINTA) 90 mg, Oral, 2 times daily   Radiology:  No results found.  Cardiac Studies:   Renal artery duplex 09/02/2017: Right: Abnormal right Resistive Index. No evidence of right renal  artery stenosis. Left: Normal left Resistive Index. No evidence of left renal artery stenosis.  Mesenteric: Normal Celiac artery findings. 70 to 99% stenosis in the superior mesenteric artery.  Left Heart Catheterization12/14/2020:  1. Severe underlying three-vessel coronary artery disease with patent LIMA to LAD. Chronically occluded SVG to OM. Severe in-stent restenosis in left main stent extending into the proximal left circumflex. In addition, there is significant restenosis in the stent placed in the left posterior AV groove artery. The RCA is known to be small in size and severely diseased. 2. Left ventricular angiography was not performed due to chronic kidney disease. LVEDP was 13 mmHg.  Unscheduled (Alert) 07/25/2019: 1 nonsustained episode of ventricular tachycardia. On 6.30.21 (17:43) an episode of VT detected in the VF zone was effectively terminated via shock. Trans-thoracic impedance trends and the Optivol Fluid Index do no present significant abnormalities. Battery longevity is 3.1  years. RA pacing is 23.0 %, RV pacing is 98.2 %, and LV pacing is 99.0 %.  Coronary angioplasty of the left main and mid circumflex coronary artery 01/11/2019: Successful Wolverine cutting balloon angioplasty of the left main, 3.5 x 10 mm balloon utilized and high-pressure 12 atmospheric pressure inflations performed throughout the in-stent restenotic left main and proximal ostial circumflex and also mid circumflex coronary artery, 99% reduced to 0% with TIMI II to TIMI-3 flow. 60 mill contrast utilized.  Echocardiogram 06/15/2019:  Left ventricle cavity is moderately dilated. Moderate concentric  hypertrophy of the left ventricle. Doppler evidence of grade III  (restrictive) diastolic dysfunction, elevated LAP. Left ventricle regional  wall motion findings: Basal inferolateral akinesis. 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 Mild global hypokinesis. Abnormal septal wall motion due  to post-operative coronary artery bypass graft. Moderately depressed LV  systolic function with visual EF 35-40%.  Left atrial cavity is mildly dilated at 4.3 cm.  Right ventricle cavity is normal in size. Normal right ventricular  function. Pacemaker lead/ICD lead noted in the RV.  Trileaflet aortic valve. Mild (Grade I) aortic regurgitation.  Compared to 01/04/2019, EF improved from 25-30%  Scheduled Remote ICD check 07/18/2019:  No VT/VF episodes. No therapy. 1 NSVT for 6 beats. VSE most likely represents sinus tachycardia vs SVT; longest episode occurred on 07/03/19 lasting 3 minutes and 52 seconds. This was noted previously as well.  Health trends (patient activity, heart rate variability, average heart rates) are stable. Trans-thoracic impedance trends and the Optivol Fluid Index do no present significant abnormalities. Battery longevity is 3.2 years. RA pacing is 55.6 %, RV pacing is 99.5 %, and LV pacing is 99.5 %.  Unscheduled (Alert) 07/25/2019: 1 nonsustained episode of ventricular tachycardia. On 6.30.21  (17:43) an episode of VT detected in the VF zone was effectively terminated via shock. Trans-thoracic impedance trends and the Optivol Fluid Index do no present significant abnormalities. Battery longevity is 3.1 years. RA pacing is 23.0 %, RV pacing is 98.2 %, and LV pacing is 99.0 %.  EKG:  01/16/2019: AV paced rhythm at rate of 77 bpm, no further analysis.    Assessment     ICD-10-CM   1. Ischemic cardiomyopathy  I25.5   2. Chronic combined systolic and diastolic heart failure (HCC)  I50.42   3. ICD; Biventricular  Medtronic ICD Amplia MRI QWuad CRT-D  in situ 10/29/14  Z95.810   4. Essential hypertension  I10 CANCELED: EKG 12-Lead    No orders of the defined types were placed in this encounter.  There are no discontinued medications.  Recommendations:   Osiah Haring  is a 64 y.o. Caucasian male with CAD and history of CABG  x2 in 2002 with only LIMA to LAD being patent, Circumflex coronary artery is super dominant. H/O  Left main stenting in 2015 and in 2016 when he had complex PCI to in-stent restenotic circumflex/OM bifurcation, due to recurrent restenosis, underwent stenting to left main and circumflex proximal and midsegment with implantation of 2 overlapping 3.5 x 20 and 3.0 x 20 mm  Synergy DES on 08/16/2017 when he presented with decompensated heart failure and recurrent VT. Similar presentation on 01/03/2019, coronary angiography on 01/04/2018 revealing severely restenotic left main stent that extends into the circumflex proximal and also old mid circumflex stent and successful Wolverine 3.5 mm cutting balloon angioplasty on 01/11/19.   Past medical history significant for uncontrolled diabetes mellitus, hypertension, chronic systolic and diastolic heart failure, stage III chronic kidney disease, obstructive sleep apnea on CPAP. This is a 3 month office visit, presently doing well and is trying his best to maintain regular diet.  No further leg edema, no PND orthopnea.  Fortunately he  has not had any weight gain.  No clinical evidence of heart failure.  LVEF is improved.  I discussed with him regarding his uncontrolled diabetes is only pending risk factor along with being overweight and he could certainly improve his life and longevity by making some changes.  I will see him back in 3 months.    Adrian Prows, MD, [facility] 08/25/2019, 2:00 PM Office: [phone]

## 2019-08-30 ENCOUNTER — Other Ambulatory Visit (HOSPITAL_COMMUNITY): Payer: No Typology Code available for payment source

## 2019-08-30 ENCOUNTER — Ambulatory Visit: Payer: No Typology Code available for payment source | Admitting: Cardiology

## 2019-09-03 ENCOUNTER — Ambulatory Visit (HOSPITAL_COMMUNITY): Admit: 2019-09-03 | Payer: No Typology Code available for payment source | Admitting: Gastroenterology

## 2019-09-03 ENCOUNTER — Encounter (HOSPITAL_COMMUNITY): Payer: Self-pay

## 2019-09-03 SURGERY — SIGMOIDOSCOPY, FLEXIBLE
Anesthesia: Monitor Anesthesia Care

## 2019-09-07 DIAGNOSIS — I5043 Acute on chronic combined systolic (congestive) and diastolic (congestive) heart failure: Secondary | ICD-10-CM | POA: Diagnosis not present

## 2019-09-11 ENCOUNTER — Telehealth: Payer: Self-pay

## 2019-09-11 NOTE — Telephone Encounter (Signed)
-----   Message from [name], MD sent at 09/10/2019  5:36 PM EDT ----- Martin Bush,   Can you call this patient and offer him follow-up with me for his diabetes? Thanks.   ----- Message ----- From: De Hollingshead, [facility] Sent: 09/10/2019   5:04 PM EDT To: [name], MD  Patient flagged on A1c >9% report. Looks like he is WAY past overdue for follow up with you, and with endocrinology. Just FYI.  Catie

## 2019-09-20 NOTE — Telephone Encounter (Signed)
I called and spoke with the patient and scheduled a f/up with the provider.  Amry Cathy,cma

## 2019-09-20 NOTE — Telephone Encounter (Signed)
A1c was 12.6 recently.  He needs to be seen for diabetes follow-up.  Please try to call him again.  Thanks.

## 2019-10-08 ENCOUNTER — Encounter (HOSPITAL_COMMUNITY): Payer: Self-pay | Admitting: Emergency Medicine

## 2019-10-08 ENCOUNTER — Inpatient Hospital Stay (HOSPITAL_COMMUNITY)
Admission: EM | Admit: 2019-10-08 | Discharge: 2019-10-09 | DRG: 246 | Disposition: A | Discharge status: Home / Self Care | Payer: No Typology Code available for payment source | Attending: Internal Medicine | Admitting: Internal Medicine

## 2019-10-08 DIAGNOSIS — G4733 Obstructive sleep apnea (adult) (pediatric): Secondary | ICD-10-CM | POA: Diagnosis present

## 2019-10-08 DIAGNOSIS — I252 Old myocardial infarction: Secondary | ICD-10-CM

## 2019-10-08 DIAGNOSIS — I255 Ischemic cardiomyopathy: Secondary | ICD-10-CM | POA: Diagnosis present

## 2019-10-08 DIAGNOSIS — E7800 Pure hypercholesterolemia, unspecified: Secondary | ICD-10-CM | POA: Diagnosis present

## 2019-10-08 DIAGNOSIS — Z95820 Peripheral vascular angioplasty status with implants and grafts: Secondary | ICD-10-CM

## 2019-10-08 DIAGNOSIS — Z83438 Family history of other disorder of lipoprotein metabolism and other lipidemia: Secondary | ICD-10-CM

## 2019-10-08 DIAGNOSIS — J810 Acute pulmonary edema: Secondary | ICD-10-CM | POA: Diagnosis present

## 2019-10-08 DIAGNOSIS — E1169 Type 2 diabetes mellitus with other specified complication: Secondary | ICD-10-CM

## 2019-10-08 DIAGNOSIS — E1122 Type 2 diabetes mellitus with diabetic chronic kidney disease: Secondary | ICD-10-CM | POA: Diagnosis present

## 2019-10-08 DIAGNOSIS — N179 Acute kidney failure, unspecified: Secondary | ICD-10-CM | POA: Diagnosis present

## 2019-10-08 DIAGNOSIS — R0602 Shortness of breath: Secondary | ICD-10-CM | POA: Diagnosis not present

## 2019-10-08 DIAGNOSIS — M19042 Primary osteoarthritis, left hand: Secondary | ICD-10-CM | POA: Diagnosis present

## 2019-10-08 DIAGNOSIS — J9621 Acute and chronic respiratory failure with hypoxia: Secondary | ICD-10-CM

## 2019-10-08 DIAGNOSIS — Z8249 Family history of ischemic heart disease and other diseases of the circulatory system: Secondary | ICD-10-CM

## 2019-10-08 DIAGNOSIS — Z87891 Personal history of nicotine dependence: Secondary | ICD-10-CM

## 2019-10-08 DIAGNOSIS — Z955 Presence of coronary angioplasty implant and graft: Secondary | ICD-10-CM

## 2019-10-08 DIAGNOSIS — I5043 Acute on chronic combined systolic (congestive) and diastolic (congestive) heart failure: Secondary | ICD-10-CM | POA: Diagnosis present

## 2019-10-08 DIAGNOSIS — M19041 Primary osteoarthritis, right hand: Secondary | ICD-10-CM | POA: Diagnosis present

## 2019-10-08 DIAGNOSIS — Z20822 Contact with and (suspected) exposure to covid-19: Secondary | ICD-10-CM | POA: Diagnosis present

## 2019-10-08 DIAGNOSIS — I214 Non-ST elevation (NSTEMI) myocardial infarction: Secondary | ICD-10-CM | POA: Diagnosis not present

## 2019-10-08 DIAGNOSIS — Z95810 Presence of automatic (implantable) cardiac defibrillator: Secondary | ICD-10-CM | POA: Diagnosis present

## 2019-10-08 DIAGNOSIS — I25110 Atherosclerotic heart disease of native coronary artery with unstable angina pectoris: Secondary | ICD-10-CM

## 2019-10-08 DIAGNOSIS — E1165 Type 2 diabetes mellitus with hyperglycemia: Secondary | ICD-10-CM | POA: Diagnosis present

## 2019-10-08 DIAGNOSIS — Z8049 Family history of malignant neoplasm of other genital organs: Secondary | ICD-10-CM

## 2019-10-08 DIAGNOSIS — N189 Chronic kidney disease, unspecified: Secondary | ICD-10-CM | POA: Diagnosis present

## 2019-10-08 DIAGNOSIS — Z801 Family history of malignant neoplasm of trachea, bronchus and lung: Secondary | ICD-10-CM

## 2019-10-08 DIAGNOSIS — E119 Type 2 diabetes mellitus without complications: Secondary | ICD-10-CM

## 2019-10-08 DIAGNOSIS — E785 Hyperlipidemia, unspecified: Secondary | ICD-10-CM | POA: Diagnosis present

## 2019-10-08 DIAGNOSIS — N183 Chronic kidney disease, stage 3 unspecified: Secondary | ICD-10-CM | POA: Diagnosis present

## 2019-10-08 DIAGNOSIS — E1141 Type 2 diabetes mellitus with diabetic mononeuropathy: Secondary | ICD-10-CM | POA: Diagnosis present

## 2019-10-08 DIAGNOSIS — I130 Hypertensive heart and chronic kidney disease with heart failure and stage 1 through stage 4 chronic kidney disease, or unspecified chronic kidney disease: Secondary | ICD-10-CM | POA: Diagnosis present

## 2019-10-08 DIAGNOSIS — Z833 Family history of diabetes mellitus: Secondary | ICD-10-CM

## 2019-10-08 DIAGNOSIS — I2510 Atherosclerotic heart disease of native coronary artery without angina pectoris: Secondary | ICD-10-CM

## 2019-10-08 DIAGNOSIS — Z951 Presence of aortocoronary bypass graft: Secondary | ICD-10-CM

## 2019-10-08 MED ORDER — NITROGLYCERIN IN D5W 200-5 MCG/ML-% IV SOLN
INTRAVENOUS | Status: AC
  Filled 2019-10-08: qty 250

## 2019-10-08 MED ORDER — NITROGLYCERIN IN D5W 200-5 MCG/ML-% IV SOLN
0.0000 ug/min | INTRAVENOUS | Status: DC
  Administered 2019-10-09: 50 ug/min via INTRAVENOUS
  Filled 2019-10-08: qty 250

## 2019-10-08 NOTE — ED Notes (Signed)
BIB EMS from home. Patient had sudden onset of gen weakness, N/V, SOB. O2 sats 80's upon EMS arrival. Placed on CPAP en route. Patient lethargic but will follow commands.

## 2019-10-09 ENCOUNTER — Other Ambulatory Visit (HOSPITAL_COMMUNITY): Payer: No Typology Code available for payment source

## 2019-10-09 ENCOUNTER — Telehealth: Payer: Self-pay

## 2019-10-09 ENCOUNTER — Encounter (HOSPITAL_COMMUNITY): Payer: Self-pay | Admitting: Internal Medicine

## 2019-10-09 ENCOUNTER — Inpatient Hospital Stay (HOSPITAL_COMMUNITY)
Admission: EM | Disposition: A | Discharge status: Home / Self Care | Payer: Self-pay | Source: Home / Self Care | Attending: Internal Medicine

## 2019-10-09 ENCOUNTER — Emergency Department (HOSPITAL_COMMUNITY): Payer: No Typology Code available for payment source

## 2019-10-09 ENCOUNTER — Telehealth: Payer: Self-pay | Admitting: Student

## 2019-10-09 DIAGNOSIS — E1141 Type 2 diabetes mellitus with diabetic mononeuropathy: Secondary | ICD-10-CM | POA: Diagnosis present

## 2019-10-09 DIAGNOSIS — I5043 Acute on chronic combined systolic (congestive) and diastolic (congestive) heart failure: Secondary | ICD-10-CM

## 2019-10-09 DIAGNOSIS — I130 Hypertensive heart and chronic kidney disease with heart failure and stage 1 through stage 4 chronic kidney disease, or unspecified chronic kidney disease: Secondary | ICD-10-CM | POA: Diagnosis present

## 2019-10-09 DIAGNOSIS — M19042 Primary osteoarthritis, left hand: Secondary | ICD-10-CM | POA: Diagnosis present

## 2019-10-09 DIAGNOSIS — Z801 Family history of malignant neoplasm of trachea, bronchus and lung: Secondary | ICD-10-CM | POA: Diagnosis not present

## 2019-10-09 DIAGNOSIS — E1165 Type 2 diabetes mellitus with hyperglycemia: Secondary | ICD-10-CM | POA: Diagnosis present

## 2019-10-09 DIAGNOSIS — R0602 Shortness of breath: Secondary | ICD-10-CM | POA: Diagnosis present

## 2019-10-09 DIAGNOSIS — Z83438 Family history of other disorder of lipoprotein metabolism and other lipidemia: Secondary | ICD-10-CM | POA: Diagnosis not present

## 2019-10-09 DIAGNOSIS — Z8249 Family history of ischemic heart disease and other diseases of the circulatory system: Secondary | ICD-10-CM | POA: Diagnosis not present

## 2019-10-09 DIAGNOSIS — E7800 Pure hypercholesterolemia, unspecified: Secondary | ICD-10-CM | POA: Diagnosis present

## 2019-10-09 DIAGNOSIS — Z833 Family history of diabetes mellitus: Secondary | ICD-10-CM | POA: Diagnosis not present

## 2019-10-09 DIAGNOSIS — M19041 Primary osteoarthritis, right hand: Secondary | ICD-10-CM | POA: Diagnosis present

## 2019-10-09 DIAGNOSIS — I252 Old myocardial infarction: Secondary | ICD-10-CM | POA: Diagnosis not present

## 2019-10-09 DIAGNOSIS — E119 Type 2 diabetes mellitus without complications: Secondary | ICD-10-CM

## 2019-10-09 DIAGNOSIS — J810 Acute pulmonary edema: Secondary | ICD-10-CM | POA: Diagnosis not present

## 2019-10-09 DIAGNOSIS — N183 Chronic kidney disease, stage 3 unspecified: Secondary | ICD-10-CM | POA: Diagnosis present

## 2019-10-09 DIAGNOSIS — I255 Ischemic cardiomyopathy: Secondary | ICD-10-CM | POA: Diagnosis present

## 2019-10-09 DIAGNOSIS — I214 Non-ST elevation (NSTEMI) myocardial infarction: Secondary | ICD-10-CM | POA: Diagnosis present

## 2019-10-09 DIAGNOSIS — Z794 Long term (current) use of insulin: Secondary | ICD-10-CM | POA: Diagnosis not present

## 2019-10-09 DIAGNOSIS — E785 Hyperlipidemia, unspecified: Secondary | ICD-10-CM | POA: Diagnosis present

## 2019-10-09 DIAGNOSIS — E1122 Type 2 diabetes mellitus with diabetic chronic kidney disease: Secondary | ICD-10-CM | POA: Diagnosis present

## 2019-10-09 DIAGNOSIS — G4733 Obstructive sleep apnea (adult) (pediatric): Secondary | ICD-10-CM | POA: Diagnosis present

## 2019-10-09 DIAGNOSIS — Z951 Presence of aortocoronary bypass graft: Secondary | ICD-10-CM | POA: Diagnosis not present

## 2019-10-09 DIAGNOSIS — Z20822 Contact with and (suspected) exposure to covid-19: Secondary | ICD-10-CM | POA: Diagnosis present

## 2019-10-09 DIAGNOSIS — Z8049 Family history of malignant neoplasm of other genital organs: Secondary | ICD-10-CM | POA: Diagnosis not present

## 2019-10-09 DIAGNOSIS — Z955 Presence of coronary angioplasty implant and graft: Secondary | ICD-10-CM | POA: Diagnosis not present

## 2019-10-09 DIAGNOSIS — I25110 Atherosclerotic heart disease of native coronary artery with unstable angina pectoris: Secondary | ICD-10-CM

## 2019-10-09 DIAGNOSIS — Z95810 Presence of automatic (implantable) cardiac defibrillator: Secondary | ICD-10-CM | POA: Diagnosis not present

## 2019-10-09 DIAGNOSIS — I2510 Atherosclerotic heart disease of native coronary artery without angina pectoris: Secondary | ICD-10-CM

## 2019-10-09 HISTORY — PX: CORONARY BALLOON ANGIOPLASTY: CATH118233

## 2019-10-09 HISTORY — PX: CORONARY STENT INTERVENTION: CATH118234

## 2019-10-09 LAB — HEPATIC FUNCTION PANEL
ALT: 15 U/L (ref 0–44)
AST: 17 U/L (ref 15–41)
Albumin: 3.2 g/dL — ABNORMAL LOW (ref 3.5–5.0)
Alkaline Phosphatase: 118 U/L (ref 38–126)
Bilirubin, Direct: 0.1 mg/dL (ref 0.0–0.2)
Indirect Bilirubin: 0.6 mg/dL (ref 0.3–0.9)
Total Bilirubin: 0.7 mg/dL (ref 0.3–1.2)
Total Protein: 6.2 g/dL — ABNORMAL LOW (ref 6.5–8.1)

## 2019-10-09 LAB — CBC
HCT: 44.1 % (ref 39.0–52.0)
Hemoglobin: 13.9 g/dL (ref 13.0–17.0)
MCH: 30.1 pg (ref 26.0–34.0)
MCHC: 31.5 g/dL (ref 30.0–36.0)
MCV: 95.5 fL (ref 80.0–100.0)
Platelets: 266 10*3/uL (ref 150–400)
RBC: 4.62 MIL/uL (ref 4.22–5.81)
RDW: 12.9 % (ref 11.5–15.5)
WBC: 17 10*3/uL — ABNORMAL HIGH (ref 4.0–10.5)
nRBC: 0 % (ref 0.0–0.2)

## 2019-10-09 LAB — BASIC METABOLIC PANEL
Anion gap: 8 (ref 5–15)
BUN: 25 mg/dL — ABNORMAL HIGH (ref 8–23)
CO2: 26 mmol/L (ref 22–32)
Calcium: 8.5 mg/dL — ABNORMAL LOW (ref 8.9–10.3)
Chloride: 100 mmol/L (ref 98–111)
Creatinine, Ser: 1.63 mg/dL — ABNORMAL HIGH (ref 0.61–1.24)
GFR calc Af Amer: 51 mL/min — ABNORMAL LOW (ref >60)
GFR calc non Af Amer: 44 mL/min — ABNORMAL LOW (ref >60)
Glucose, Bld: 488 mg/dL — ABNORMAL HIGH (ref 70–99)
Potassium: 4.2 mmol/L (ref 3.5–5.1)
Sodium: 134 mmol/L — ABNORMAL LOW (ref 135–145)

## 2019-10-09 LAB — CBC WITH DIFFERENTIAL/PLATELET
Abs Immature Granulocytes: 0.08 10*3/uL — ABNORMAL HIGH (ref 0.00–0.07)
Basophils Absolute: 0.1 10*3/uL (ref 0.0–0.1)
Basophils Relative: 1 %
Eosinophils Absolute: 0.1 10*3/uL (ref 0.0–0.5)
Eosinophils Relative: 1 %
HCT: 37.1 % — ABNORMAL LOW (ref 39.0–52.0)
Hemoglobin: 11.7 g/dL — ABNORMAL LOW (ref 13.0–17.0)
Immature Granulocytes: 1 %
Lymphocytes Relative: 5 %
Lymphs Abs: 0.6 10*3/uL — ABNORMAL LOW (ref 0.7–4.0)
MCH: 30 pg (ref 26.0–34.0)
MCHC: 31.5 g/dL (ref 30.0–36.0)
MCV: 95.1 fL (ref 80.0–100.0)
Monocytes Absolute: 1.1 10*3/uL — ABNORMAL HIGH (ref 0.1–1.0)
Monocytes Relative: 10 %
Neutro Abs: 8.7 10*3/uL — ABNORMAL HIGH (ref 1.7–7.7)
Neutrophils Relative %: 82 %
Platelets: 177 10*3/uL (ref 150–400)
RBC: 3.9 MIL/uL — ABNORMAL LOW (ref 4.22–5.81)
RDW: 13 % (ref 11.5–15.5)
WBC: 10.6 10*3/uL — ABNORMAL HIGH (ref 4.0–10.5)
nRBC: 0 % (ref 0.0–0.2)

## 2019-10-09 LAB — HEMOGLOBIN A1C
Hgb A1c MFr Bld: 11.7 % — ABNORMAL HIGH (ref 4.8–5.6)
Mean Plasma Glucose: 289.09 mg/dL

## 2019-10-09 LAB — COMPREHENSIVE METABOLIC PANEL
ALT: 18 U/L (ref 0–44)
AST: 23 U/L (ref 15–41)
Albumin: 3.7 g/dL (ref 3.5–5.0)
Alkaline Phosphatase: 155 U/L — ABNORMAL HIGH (ref 38–126)
Anion gap: 14 (ref 5–15)
BUN: 23 mg/dL (ref 8–23)
CO2: 21 mmol/L — ABNORMAL LOW (ref 22–32)
Calcium: 8.7 mg/dL — ABNORMAL LOW (ref 8.9–10.3)
Chloride: 98 mmol/L (ref 98–111)
Creatinine, Ser: 1.59 mg/dL — ABNORMAL HIGH (ref 0.61–1.24)
GFR calc Af Amer: 53 mL/min — ABNORMAL LOW (ref >60)
GFR calc non Af Amer: 46 mL/min — ABNORMAL LOW (ref >60)
Glucose, Bld: 558 mg/dL (ref 70–99)
Potassium: 4.5 mmol/L (ref 3.5–5.1)
Sodium: 133 mmol/L — ABNORMAL LOW (ref 135–145)
Total Bilirubin: 1 mg/dL (ref 0.3–1.2)
Total Protein: 7.4 g/dL (ref 6.5–8.1)

## 2019-10-09 LAB — BRAIN NATRIURETIC PEPTIDE: B Natriuretic Peptide: 2026.8 pg/mL — ABNORMAL HIGH (ref 0.0–100.0)

## 2019-10-09 LAB — TSH: TSH: 1.422 u[IU]/mL (ref 0.350–4.500)

## 2019-10-09 LAB — POCT ACTIVATED CLOTTING TIME
Activated Clotting Time: 224 seconds
Activated Clotting Time: 296 seconds

## 2019-10-09 LAB — MAGNESIUM: Magnesium: 1.8 mg/dL (ref 1.7–2.4)

## 2019-10-09 LAB — TROPONIN I (HIGH SENSITIVITY)
Troponin I (High Sensitivity): 432 ng/L (ref <18)
Troponin I (High Sensitivity): 474 ng/L (ref <18)
Troponin I (High Sensitivity): 80 ng/L — ABNORMAL HIGH (ref <18)

## 2019-10-09 LAB — GLUCOSE, CAPILLARY: Glucose-Capillary: 263 mg/dL — ABNORMAL HIGH (ref 70–99)

## 2019-10-09 LAB — CBG MONITORING, ED: Glucose-Capillary: 419 mg/dL — ABNORMAL HIGH (ref 70–99)

## 2019-10-09 LAB — SARS CORONAVIRUS 2 BY RT PCR (HOSPITAL ORDER, PERFORMED IN ~~LOC~~ HOSPITAL LAB): SARS Coronavirus 2: NEGATIVE

## 2019-10-09 SURGERY — LEFT HEART CATH AND CORS/GRAFTS ANGIOGRAPHY
Anesthesia: LOCAL

## 2019-10-09 MED ORDER — HEPARIN SODIUM (PORCINE) 1000 UNIT/ML IJ SOLN
INTRAMUSCULAR | Status: AC
  Filled 2019-10-09: qty 1

## 2019-10-09 MED ORDER — SACUBITRIL-VALSARTAN 97-103 MG PO TABS
1.0000 | ORAL_TABLET | Freq: Two times a day (BID) | ORAL | Status: DC
Start: 2019-10-12 — End: 2019-10-17

## 2019-10-09 MED ORDER — OMEGA-3-ACID ETHYL ESTERS 1 G PO CAPS: 1.0000 g | ORAL_CAPSULE | Freq: Two times a day (BID) | ORAL | Status: DC

## 2019-10-09 MED ORDER — IOHEXOL 350 MG/ML SOLN
INTRAVENOUS | Status: DC | PRN
  Administered 2019-10-09: 70 mL

## 2019-10-09 MED ORDER — ONDANSETRON HCL 4 MG/2ML IJ SOLN: 4.0000 mg | Freq: Four times a day (QID) | INTRAMUSCULAR | Status: DC | PRN

## 2019-10-09 MED ORDER — SODIUM CHLORIDE 0.9 % IV SOLN: INTRAVENOUS | Status: DC

## 2019-10-09 MED ORDER — SODIUM CHLORIDE 0.9 % WEIGHT BASED INFUSION: 3.0000 mL/kg/h | INTRAVENOUS | Status: AC

## 2019-10-09 MED ORDER — SODIUM CHLORIDE 0.9% FLUSH: 3.0000 mL | INTRAVENOUS | Status: DC | PRN

## 2019-10-09 MED ORDER — FUROSEMIDE 10 MG/ML IJ SOLN: 60.0000 mg | Freq: Two times a day (BID) | INTRAMUSCULAR | Status: DC

## 2019-10-09 MED ORDER — LABETALOL HCL 5 MG/ML IV SOLN: 10.0000 mg | INTRAVENOUS | Status: AC | PRN

## 2019-10-09 MED ORDER — ACETAMINOPHEN 325 MG PO TABS: 650.0000 mg | ORAL_TABLET | ORAL | Status: DC | PRN

## 2019-10-09 MED ORDER — SODIUM CHLORIDE 0.9 % WEIGHT BASED INFUSION: 1.0000 mL/kg/h | INTRAVENOUS | Status: DC

## 2019-10-09 MED ORDER — SACUBITRIL-VALSARTAN 97-103 MG PO TABS
1.0000 | ORAL_TABLET | Freq: Two times a day (BID) | ORAL | Status: DC
Start: 2019-10-09 — End: 2019-10-09

## 2019-10-09 MED ORDER — HEPARIN BOLUS VIA INFUSION
4000.0000 [IU] | Freq: Once | INTRAVENOUS | Status: AC
  Administered 2019-10-09: 4000 [IU] via INTRAVENOUS
  Filled 2019-10-09: qty 4000

## 2019-10-09 MED ORDER — TICAGRELOR 90 MG PO TABS
ORAL_TABLET | ORAL | Status: DC | PRN
  Administered 2019-10-09: 90 mg via ORAL

## 2019-10-09 MED ORDER — LIDOCAINE HCL (PF) 1 % IJ SOLN
INTRAMUSCULAR | Status: DC | PRN
  Administered 2019-10-09: 2 mL

## 2019-10-09 MED ORDER — HEPARIN SODIUM (PORCINE) 1000 UNIT/ML IJ SOLN
INTRAMUSCULAR | Status: DC | PRN
  Administered 2019-10-09: 8000 [IU] via INTRAVENOUS
  Administered 2019-10-09: 2000 [IU] via INTRAVENOUS
  Administered 2019-10-09: 3000 [IU] via INTRAVENOUS

## 2019-10-09 MED ORDER — EZETIMIBE 10 MG PO TABS: 10.0000 mg | ORAL_TABLET | Freq: Every day | ORAL | Status: DC

## 2019-10-09 MED ORDER — ONDANSETRON HCL 4 MG PO TABS: 4.0000 mg | ORAL_TABLET | Freq: Four times a day (QID) | ORAL | Status: DC | PRN

## 2019-10-09 MED ORDER — INSULIN GLARGINE 100 UNIT/ML ~~LOC~~ SOLN
40.0000 [IU] | Freq: Every day | SUBCUTANEOUS | Status: DC
  Administered 2019-10-09: 40 [IU] via SUBCUTANEOUS
  Filled 2019-10-09: qty 0.4

## 2019-10-09 MED ORDER — ASPIRIN 81 MG PO CHEW
81.0000 mg | CHEWABLE_TABLET | ORAL | Status: AC
  Administered 2019-10-09: 81 mg via ORAL
  Filled 2019-10-09: qty 1

## 2019-10-09 MED ORDER — ASPIRIN EC 81 MG PO TBEC: 81.0000 mg | DELAYED_RELEASE_TABLET | Freq: Every day | ORAL | Status: DC

## 2019-10-09 MED ORDER — ROSUVASTATIN CALCIUM 20 MG PO TABS: 40.0000 mg | ORAL_TABLET | Freq: Every day | ORAL | Status: DC

## 2019-10-09 MED ORDER — AMIODARONE HCL 200 MG PO TABS: 100.0000 mg | ORAL_TABLET | Freq: Every day | ORAL | Status: DC

## 2019-10-09 MED ORDER — FUROSEMIDE 10 MG/ML IJ SOLN
80.0000 mg | Freq: Once | INTRAMUSCULAR | Status: AC
  Administered 2019-10-09: 80 mg via INTRAVENOUS
  Filled 2019-10-09: qty 8

## 2019-10-09 MED ORDER — ENOXAPARIN SODIUM 40 MG/0.4ML ~~LOC~~ SOLN: 40.0000 mg | Freq: Every day | SUBCUTANEOUS | Status: DC

## 2019-10-09 MED ORDER — FENTANYL CITRATE (PF) 100 MCG/2ML IJ SOLN
INTRAMUSCULAR | Status: DC | PRN
Start: 2019-10-09 — End: 2019-10-09
  Administered 2019-10-09: 25 ug via INTRAVENOUS

## 2019-10-09 MED ORDER — TICAGRELOR 90 MG PO TABS
ORAL_TABLET | ORAL | Status: AC
  Filled 2019-10-09: qty 1

## 2019-10-09 MED ORDER — SODIUM CHLORIDE 0.9% FLUSH: 3.0000 mL | Freq: Two times a day (BID) | INTRAVENOUS | Status: DC

## 2019-10-09 MED ORDER — CARVEDILOL 12.5 MG PO TABS: 6.2500 mg | ORAL_TABLET | Freq: Two times a day (BID) | ORAL | Status: DC

## 2019-10-09 MED ORDER — NITROGLYCERIN 1 MG/10 ML FOR IR/CATH LAB
INTRA_ARTERIAL | Status: AC
  Filled 2019-10-09: qty 10

## 2019-10-09 MED ORDER — INSULIN ASPART 100 UNIT/ML ~~LOC~~ SOLN
0.0000 [IU] | Freq: Three times a day (TID) | SUBCUTANEOUS | Status: DC
  Administered 2019-10-09: 15 [IU] via SUBCUTANEOUS

## 2019-10-09 MED ORDER — METFORMIN HCL 500 MG PO TABS
1000.0000 mg | ORAL_TABLET | Freq: Two times a day (BID) | ORAL | Status: DC
Start: 2019-10-12 — End: 2019-12-17

## 2019-10-09 MED ORDER — SERTRALINE HCL 50 MG PO TABS: 50.0000 mg | ORAL_TABLET | Freq: Every day | ORAL | Status: DC

## 2019-10-09 MED ORDER — TICAGRELOR 90 MG PO TABS: 90.0000 mg | ORAL_TABLET | Freq: Two times a day (BID) | ORAL | Status: DC

## 2019-10-09 MED ORDER — HEPARIN (PORCINE) IN NACL 1000-0.9 UT/500ML-% IV SOLN
INTRAVENOUS | Status: DC | PRN
  Administered 2019-10-09 (×2): 500 mL

## 2019-10-09 MED ORDER — INSULIN ASPART 100 UNIT/ML ~~LOC~~ SOLN
12.0000 [IU] | Freq: Once | SUBCUTANEOUS | Status: AC
  Administered 2019-10-09: 12 [IU] via SUBCUTANEOUS

## 2019-10-09 MED ORDER — MIDAZOLAM HCL 2 MG/2ML IJ SOLN
INTRAMUSCULAR | Status: DC | PRN
  Administered 2019-10-09: 1 mg via INTRAVENOUS

## 2019-10-09 MED ORDER — MIDAZOLAM HCL 2 MG/2ML IJ SOLN
INTRAMUSCULAR | Status: AC
  Filled 2019-10-09: qty 2

## 2019-10-09 MED ORDER — FENTANYL CITRATE (PF) 100 MCG/2ML IJ SOLN
INTRAMUSCULAR | Status: AC
  Filled 2019-10-09: qty 2

## 2019-10-09 MED ORDER — SODIUM CHLORIDE 0.9 % IV SOLN: 250.0000 mL | INTRAVENOUS | Status: DC | PRN

## 2019-10-09 MED ORDER — HEPARIN (PORCINE) 25000 UT/250ML-% IV SOLN
1150.0000 [IU]/h | INTRAVENOUS | Status: DC
  Administered 2019-10-09: 1150 [IU]/h via INTRAVENOUS
  Filled 2019-10-09: qty 250

## 2019-10-09 MED ORDER — POTASSIUM CHLORIDE CRYS ER 20 MEQ PO TBCR: 20.0000 meq | EXTENDED_RELEASE_TABLET | Freq: Two times a day (BID) | ORAL | Status: DC

## 2019-10-09 MED ORDER — VERAPAMIL HCL 2.5 MG/ML IV SOLN
INTRAVENOUS | Status: DC | PRN
  Administered 2019-10-09: 4 mL via INTRA_ARTERIAL

## 2019-10-09 MED ORDER — INSULIN ASPART 100 UNIT/ML ~~LOC~~ SOLN: 0.0000 [IU] | SUBCUTANEOUS | Status: DC

## 2019-10-09 MED ORDER — INSULIN ASPART 100 UNIT/ML ~~LOC~~ SOLN
SUBCUTANEOUS | Status: AC
  Filled 2019-10-09: qty 1

## 2019-10-09 SURGICAL SUPPLY — 21 items
BALLN SAPPHIRE 2.0X15 (BALLOONS) ×2
BALLN WOLVERINE 3.00X10 (BALLOONS) ×2
BALLN WOLVERINE 3.75X15 (BALLOONS) ×2
BALLOON SAPPHIRE 2.0X15 (BALLOONS) IMPLANT
BALLOON WOLVERINE 3.00X10 (BALLOONS) IMPLANT
BALLOON WOLVERINE 3.75X15 (BALLOONS) IMPLANT
CATH INFINITI JR4 5F (CATHETERS) ×1 IMPLANT
CATH LAUNCHER 6FR EBU3.5 (CATHETERS) ×1 IMPLANT
CATH OPTITORQUE TIG 4.0 5F (CATHETERS) ×1 IMPLANT
DEVICE RAD COMP TR BAND LRG (VASCULAR PRODUCTS) ×1 IMPLANT
GLIDESHEATH SLEND A-KIT 6F 22G (SHEATH) ×1 IMPLANT
GUIDEWIRE INQWIRE 1.5J.035X260 (WIRE) IMPLANT
INQWIRE 1.5J .035X260CM (WIRE) ×4
KIT ENCORE 26 ADVANTAGE (KITS) ×1 IMPLANT
KIT HEART LEFT (KITS) ×2 IMPLANT
PACK CARDIAC CATHETERIZATION (CUSTOM PROCEDURE TRAY) ×2 IMPLANT
SHEATH PROBE COVER 6X72 (BAG) ×1 IMPLANT
STENT RESOLUTE ONYX 4.0X18 (Permanent Stent) ×1 IMPLANT
TRANSDUCER W/STOPCOCK (MISCELLANEOUS) ×2 IMPLANT
TUBING CIL FLEX 10 FLL-RA (TUBING) ×2 IMPLANT
WIRE COUGAR XT STRL 190CM (WIRE) ×1 IMPLANT

## 2019-10-09 NOTE — ED Notes (Signed)
C-collar removed after speaking with Dr.Mesner, bipap mask repositioned

## 2019-10-09 NOTE — Telephone Encounter (Signed)
TOC needed. Pt scheduled for 10/17/2019

## 2019-10-09 NOTE — Interval H&P Note (Signed)
History and Physical Interval Note:  10/09/2019 9:34 AM  Martin Bush  has presented today for surgery, with the diagnosis of unstable angina.  The various methods of treatment have been discussed with the patient and family. After consideration of risks, benefits and other options for treatment, the patient has consented to  Procedure(s): LEFT HEART CATH AND CORS/GRAFTS ANGIOGRAPHY (N/A) and possible PCI as a surgical intervention.  The patient's history has been reviewed, patient examined, no change in status, stable for surgery.  I have reviewed the patient's chart and labs.  Questions were answered to the patient's satisfaction.   Cath Lab Visit (complete for each Cath Lab visit)  Clinical Evaluation Leading to the Procedure:   ACS: Yes.    Non-ACS:    Anginal Classification: CCS IV  Anti-ischemic medical therapy: Maximal Therapy (2 or more classes of medications)  Non-Invasive Test Results: No non-invasive testing performed  Prior CABG: Previous CABG  Adrian Prows

## 2019-10-09 NOTE — ED Notes (Signed)
Cardiologist at bedside.  

## 2019-10-09 NOTE — Progress Notes (Addendum)
  PROGRESS NOTE  Patient admitted earlier this morning. See H&P.   Martin Bush is a 64 y.o. male with history of CAD status post CABG and multiple cardiac stenting last one in 2019 with history of ischemic cardiomyopathy last EF measured in December 2020 was 25 to 30% with chronic kidney disease stage III diabetes mellitus type 2 poorly controlled obesity presents to the ER with sudden onset of shortness of breath since last evening while watching TV with his wife.  Work-up revealed findings consistent with flash pulmonary edema.  Cardiology was consulted.  Patient was started on IV heparin and IV nitro.  Patient evaluated in the emergency department, states that he remains chest pain-free.  Breathing has improved since being in the hospital.  Cardiology is planning on proceeding with heart cath this afternoon. Insulin regimen adjusted for hyperglycemia.    Status is: Inpatient  Remains inpatient appropriate because:Ongoing diagnostic testing needed not appropriate for outpatient work up, IV treatments appropriate due to intensity of illness or inability to take PO and Inpatient level of care appropriate due to severity of illness   Dispo: The patient is from: Home              Anticipated d/c is to: Home              Anticipated d/c date is: 2 days              Patient currently is not medically stable to d/c.  Heart cath this afternoon.  Remains on IV heparin, IV nitro.  Remains on nasal cannula O2.    Dessa Phi, DO [facility] 10/09/2019, 9:52 AM  Available via Epic secure chat 7am-7pm After these hours, please refer to coverage provider listed on [email or web address]    Addendum: Patient ready to discharge post-heart cath. Appreciate Dr. Irven Shelling assistance in discharge planning.

## 2019-10-09 NOTE — Discharge Summary (Signed)
Physician Discharge Summary  Patient ID: Martin Bush MRN: [id number] DOB/AGE: 64-Jun-1957 64 y.o. Martin Haven, MD   Admit date: 10/08/2019 Discharge date: 10/09/2019  Primary Discharge Diagnosis 1. Acute pulmonary edema 2. NSTEMI 3. CAD of the native vessel with unstable angina pectoris 4. DM uncontrolled with hyperglycemia with stage 3 CKD  Significant Diagnostic Studies:  Left heart catheterization 10/09/2019: LV: Normal LVEDP.  There was no pressure gradient across the aortic valve.  Angiogram not performed to conserve contrast. Right coronary artery nondominant and severely diffusely diseased unchanged from prior cardiac catheterization. Left main: Mid to distal left main has in-stent restenosis of 99% and extends into the dominant circumflex which also has proximal in-stent 99% stenosis with TIMI II flow.  Successful scoring balloon angioplasty with 3.5 x 15 mm Wolverine at high 14 atmospheric pressure, due to recall, haziness, stented with 4.0 x 18 mm resolute Onyx DES at 18 atmospheric pressure for 60 seconds, stenosis reduced in the left main and proximal in-stent restenosis from 99% to 0% with improvement in TIMI flow from 2-3. Successful PTCA and Cutting Balloon angioplasty with Wolverine 3.0 x 10 mm balloon in the mid segment of the dominant circumflex coronary artery in-stent restenosis.  Stenosis reduced to 0% at 14 atmospheric pressure inflations x2.  TIMI-3 to TIMI-3 flow maintained. LAD: Is occluded in the proximal segment.  Distal LAD supplied by LIMA.  LIMA to LAD is widely patent.  Recommendation: Patient will be observed for 6 to 8 hours, will be ambulated, if he remains stable, I would like to discharge him today in view of COVID-19 pandemic and I will follow him this afternoon in the recovery.  70 mL of contrast was utilized.  Extremely complex patient but with excellent results and hopefully he will not have recurrent restenosis in the left main and proximal  circumflex stent.  With regard to risk factor modification, he is on appropriate medical therapy, he is also on dual antiplatelet therapy.  Diabetes needs to be controlled better.  Prior studies:  Renal artery duplex 09/02/2017: Right: Abnormal right Resistive Index. No evidence of right renal  artery stenosis. Left: Normal left Resistive Index. No evidence of left renal artery stenosis. Mesenteric: Normal Celiac artery findings. 70 to 99% stenosis in the superior mesenteric artery.  Left Heart Catheterization12/14/2020:  1. Severe underlying three-vessel coronary artery disease with patent LIMA to LAD. Chronically occluded SVG to OM. Severe in-stent restenosis in left main stent extending into the proximal left circumflex. In addition, there is significant restenosis in the stent placed in the left posterior AV groove artery. The RCA is known to be small in size and severely diseased. 2. Left ventricular angiography was not performed due to chronic kidney disease. LVEDP was 13 mmHg.  Coronary angioplasty of the left main and mid circumflex coronary artery 01/11/2019: Successful Wolverine cutting balloon angioplasty of the left main, 3.5 x 10 mm balloon utilized and high-pressure 12 atmospheric pressure inflations performed throughout the in-stent restenotic left main and proximal ostial circumflex and also mid circumflex coronary artery, 99% reduced to 0% with TIMI II to TIMI-3 flow. 60 mill contrast utilized.  Echocardiogram 06/15/2019:  Left ventricle cavity is moderately dilated. Moderate concentric  hypertrophy of the left ventricle. Doppler evidence of grade III  (restrictive) diastolic dysfunction, elevated LAP. Left ventricle regional  wall motion findings: Basal inferolateral akinesis. 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 Mild global hypokinesis. Abnormal septal wall motion due  to post-operative coronary artery bypass graft. Moderately depressed LV  systolic function with  visual EF 35-40%.  Left atrial cavity is mildly dilated at 4.3 cm.  Right ventricle cavity is normal in size. Normal right ventricular  function. Pacemaker lead/ICD lead noted in the RV.  Trileaflet aortic valve. Mild (Grade I) aortic regurgitation.  Compared to 01/04/2019, EF improved from 25-30%  Scheduled Remote ICD check 09/19/2019:  There were 0 atrial high rate episodes detected. There were 0 VF episodes, 0 FVT episodes, 0 VT episodes and 1 nonsustained episode of ventricular tachycardia (1 Sec duration). Health trends (patient activity, heart rate variability, average heart rates) are stable. Trans-thoracic impedance trends and the Optivol Fluid Index do no present significant abnormalities. Battery longevity is 2.7 years. RA pacing is 34.9 %, RV pacing is 96.4 %, and LV pacing is 99.3 %.  EKG:  EKG 10/09/2019: Sinus rhythm with ventricularly paced rhythm.  Biventricular pacemaker detected.  No further analysis.  Radiology: DG Chest Portable 1 View  Result Date: 10/09/2019 CLINICAL DATA:  Shortness of breath EXAM: PORTABLE CHEST 1 VIEW COMPARISON:  01/03/2019 FINDINGS: Unchanged cardiomegaly. Worsened bilateral interstitial opacities. Remote median sternotomy with unchanged position of left chest wall 3 lead AICD. IMPRESSION: Cardiomegaly and worsened bilateral interstitial opacities likely indicating pulmonary edema. Electronically Signed   By: Ulyses Jarred M.D.   On: 10/09/2019 00:19   Hospital Course: Martin Bush is a 64 y.o. male  patient patient admitted with critical illness with acute pulmonary edema, non-ST elevation myocardial infarction and underwent urgent cardiac catheterization and found to have high-grade restenosis in the left main and also circumflex coronary artery.  He also had a new stenosis in a large dominant mid circumflex.  He underwent successful angioplasty with establishment of TIMI-3 flow from TIMI II flow.  I saw the patient around 3:30 PM, he was doing  well without recurrence of dyspnea, breathing has improved, feels stable.  In view of faster recovery than expected, in view of COVID-19 senses and difficulty with beds, patient having ICD implanted, I felt he is stable for discharge with home medications and discussed with him regarding diet and salt restriction and weight loss.  I also called his wife and spoke to her.  There is no left wrist hematoma.  Recommendations on discharge: To manage weight, obesity, DM better. DAPT for a year. OP f/u < 1 week for TOC visit.   Discharge Exam: Vitals with BMI 10/09/2019 10/09/2019 10/09/2019  Height - - -  Weight - - -  BMI - - -  Systolic [phone]  Diastolic 80 87 85  Pulse 77 73 75    Physical Exam Vitals reviewed.  Constitutional:      Comments: He is moderately built and mildly obese in no acute distress.  Cardiovascular:     Rate and Rhythm: Normal rate and regular rhythm.     Pulses: Intact distal pulses.          Femoral pulses are 2+ on the right side and 2+ on the left side.      Popliteal pulses are 1+ on the right side and 1+ on the left side.       Dorsalis pedis pulses are 0 on the right side and 0 on the left side.       Posterior tibial pulses are 0 on the right side and 0 on the left side.     Heart sounds: Normal heart sounds.     Comments: No JVD.  No pedal edema. Pulmonary:     Effort:  Pulmonary effort is normal. No accessory muscle usage or respiratory distress.     Breath sounds: Normal breath sounds.  Abdominal:     General: Bowel sounds are normal.     Palpations: Abdomen is soft.     Hernia: A hernia is present. Hernia is present in the ventral area (reducible).     Comments: obese    Labs: Lab Results  Component Value Date   WBC 10.6 (H) 10/09/2019   HGB 11.7 (L) 10/09/2019   HCT 37.1 (L) 10/09/2019   MCV 95.1 10/09/2019   PLT 177 10/09/2019    Recent Labs  Lab 10/09/19 0558  NA 134*  K 4.2  CL 100  CO2 26  BUN 25*  CREATININE 1.63*  CALCIUM  8.5*  PROT 6.2*  BILITOT 0.7  ALKPHOS 118  ALT 15  AST 17  GLUCOSE 488*   Lipid Panel     Component Value Date/Time   CHOL 127 04/18/2019 1019   TRIG 181 (H) 04/18/2019 1019   HDL 39 (L) 04/18/2019 1019   CHOLHDL 3.2 01/04/2019 0640   VLDL 30 01/04/2019 0640   LDLCALC 58 04/18/2019 1019   BNP (last 3 results) Recent Labs    01/03/19 2133 04/18/19 1019 10/09/19 0005  BNP 1,262.0* 820.8* 2,026.8*    HEMOGLOBIN A1C Lab Results  Component Value Date   HGBA1C 11.7 (H) 10/09/2019   MPG 289.09 10/09/2019   Cardiac Panel    Ref Range & Units  (10/09/19)   (10/09/19)  (10/09/19)     Troponin I (High Sensitivity) <18 ng/L 432High Panic  474High Panic CM  80High          TSH Recent Labs    04/18/19 1019 10/09/19 0559  TSH 1.460 1.422   FOLLOW UP PLANS AND APPOINTMENTS Discharge Instructions    Amb Referral to Cardiac Rehabilitation   Complete by: As directed    Diagnosis:  Coronary Stents NSTEMI     After initial evaluation and assessments completed: Virtual Based Care may be provided alone or in conjunction with Phase 2 Cardiac Rehab based on patient barriers.: Yes     Allergies as of 10/09/2019      Reactions   Diltiazem Rash      Medication List    STOP taking these medications   acetaminophen 325 MG tablet Commonly known as: TYLENOL     TAKE these medications   amiodarone 200 MG tablet Commonly known as: PACERONE Take 0.5 tablets (100 mg total) by mouth daily.   aspirin 81 MG EC tablet Take 1 tablet (81 mg total) by mouth daily.   carvedilol 6.25 MG tablet Commonly known as: COREG Take 1 tablet (6.25 mg total) by mouth 2 (two) times daily with a meal.   empagliflozin 25 MG Tabs tablet Commonly known as: JARDIANCE Take 25 mg by mouth daily.   ezetimibe 10 MG tablet Commonly known as: ZETIA Take 1 tablet (10 mg total) by mouth at bedtime.   Fish Oil 1000 MG Caps Take 1,000 mg by mouth 2 (two) times daily.   furosemide 40 MG  tablet Commonly known as: LASIX Take 40 mg by mouth daily. Currently taking ~1/week   insulin aspart protamine- aspart (70-30) 100 UNIT/ML injection Commonly known as: NOVOLOG MIX 70/30 Inject 0.25 mLs (25 Units total) into the skin 2 (two) times daily with a meal. What changed: how much to take   isosorbide mononitrate 30 MG 24 hr tablet Commonly known as: IMDUR Take 1 tablet (30 mg  total) by mouth daily.   metFORMIN 500 MG tablet Commonly known as: GLUCOPHAGE Take 2 tablets (1,000 mg total) by mouth 2 (two) times daily with a meal. Taking 2 tabs twice a day Start taking on: October 12, 2019 What changed: These instructions start on October 12, 2019. If you are unsure what to do until then, ask your doctor or other care provider.   nitroGLYCERIN 0.4 MG SL tablet Commonly known as: NITROSTAT Place 1 tablet (0.4 mg total) under the tongue every 5 (five) minutes x 3 doses as needed for chest pain.   potassium chloride SA 20 MEQ tablet Commonly known as: KLOR-CON TAKE 1 TABLET BY MOUTH TWICE A DAY   PRESCRIPTION MEDICATION Inhale into the lungs at bedtime. CPAP   rosuvastatin 40 MG tablet Commonly known as: CRESTOR Take 1 tablet (40 mg total) by mouth at bedtime.   sacubitril-valsartan 97-103 MG Commonly known as: ENTRESTO Take 1 tablet by mouth 2 (two) times daily. Start taking on: October 12, 2019 What changed: These instructions start on October 12, 2019. If you are unsure what to do until then, ask your doctor or other care provider.   sertraline 50 MG tablet Commonly known as: ZOLOFT Take 1 tablet (50 mg total) by mouth at bedtime.   ticagrelor 90 MG Tabs tablet Commonly known as: BRILINTA Take 1 tablet (90 mg total) by mouth 2 (two) times daily.   Victoza 18 MG/3ML Sopn Generic drug: liraglutide Inject 1.8 mg into the skin daily.   Vitamin D3 50 MCG (2000 UT) Tabs Take by mouth 2 (two) times daily.       Follow-up Information    Alethia Berthold, PA-C On 10/17/2019.   Specialty: Cardiology Why: 9 AM. Bring all medications. Dr. Einar Gip will also see you.  Contact information: [place] [address] [phone]                Adrian Prows, MD, [facility] 10/09/2019, 3:41 PM Office: [phone]

## 2019-10-09 NOTE — Progress Notes (Signed)
ANTICOAGULATION CONSULT NOTE - Initial Consult  Pharmacy Consult for Heparin Indication: chest pain/ACS  Allergies  Allergen Reactions  . Diltiazem Rash    Patient Measurements: Height: 5\' 7"  (170.2 cm) Weight: 99.3 kg (218 lb 14.7 oz) IBW/kg (Calculated) : 66.1 Heparin Dosing Weight: 87 kg  Vital Signs: Temp: 96.7 F (35.9 C) (09/14 0006) Temp Source: Temporal (09/14 0006) BP: 123/80 (09/14 0545) Pulse Rate: 72 (09/14 0545)  Labs: Recent Labs    10/09/19 0005  HGB 13.9  HCT 44.1  PLT 266  CREATININE 1.59*  TROPONINIHS 80*    Estimated Creatinine Clearance: 53.4 mL/min (A) (by C-G formula based on SCr of 1.59 mg/dL (H)).   Medical History: Past Medical History:  Diagnosis Date  . AICD (automatic cardioverter/defibrillator) present    Medtronic  . Arthritis    "thumbs"  (08/02/2017)  . CHF (congestive heart failure) ([place])   . Chronic combined systolic and diastolic heart failure ([place])    a. 08/2017 Echo: EF 20-25%, mod glob HK. Sev distal ant sept, inflat HK. Apical AK. Gr2 DD. Mildly reduced RV fxn.  . Colon polyps   . Coronary artery disease    a. s/p CABG x 2 (LIMA->LAD, VG->OM); b. Multiple PCI's to LM/LCX/OM; c. 07/2017 PTCA of LM/LCX w/ early ISR-->repeat PCI/DES to LM (3.5x20 Synergy DES) and LCX (3.0x20 Synergy DES); d. 07/2018 Relook Cath: LM patent stent, LAD 100ost, LCX patent stent, OM1 99/60, LIMA->LAD ok. VG->dLCX 98 (old).  . Depression   . Encounter for assessment of implantable cardioverter-defibrillator (ICD) 09/27/2018  . High cholesterol   . Hypertension   . ICD; Biventricular  Medtronic ICD Amplia MRI QWuad CRT-D  in situ 10/29/14 10/29/2014   Remote ICD check 09.23.20:  One 6 beat NSVT. No therapy.  1 SVT episode @ 130 bpm (38 Sec).  There were 23 Vent sense episodes detected for up to 1.1 min/day (AT). Health trends (patient activity, heart rate variability, average heart rates) are stable.Trans-thoracic impedance trends and the OptiVol Fluid  Index do no present significant abnormalities. Battery longevity is 4.3 years. RA pacing is 3  . Ischemic cardiomyopathy 09/27/2018  . MI (myocardial infarction) ([place]) 2003  . NSTEMI (non-ST elevated myocardial infarction) ([place]) 07/16/2014  . OSA on CPAP   . Oxygen deficiency   . Pneumonia 10/2014  . Proteinuria   . Sleep apnea   . Type II diabetes mellitus ([facility])    insulin dependent    Medications:  See electronic med rec  Assessment: 64 y.o. M presents with SOB. To begin heparin for r/o ACS. No AC PTA. CBC ok on admission.  Goal of Therapy:  Heparin level 0.3-0.7 units/ml Monitor platelets by anticoagulation protocol: Yes   Plan:  Heparin IV bolus 4000 units Heparin gtt at 1150 units/hr Will f/u heparin level in 6 hours Daily heparin level and CBC  Sherlon Handing, PharmD, BCPS Please see amion for complete clinical pharmacist phone list 10/09/2019,6:42 AM

## 2019-10-09 NOTE — ED Notes (Signed)
Pt resting comfortably on R side, pt remains on 10L NRB, VSS

## 2019-10-09 NOTE — Telephone Encounter (Signed)
Called patient, Martin Bush, LMAM

## 2019-10-09 NOTE — Consult Note (Signed)
CARDIOLOGY CONSULT NOTE  Patient ID: Martin Bush MRN: [id number] DOB/AGE: 64-Jun-1957 64 y.o.  Admit date: 10/08/2019 Referring Physician  Doylene Bode, MD Primary Physician:  [name], MD Reason for Consultation  CHF  Patient ID: Martin Bush, male    DOB: Jul 10, 1955, 64 y.o.   MRN: [id number]  Chief Complaint  Patient presents with  . Shortness of Breath   HPI:    [name]  is a 64 y.o. Caucasian male  with CAD and history of CABG x2 in 2002 with only LIMA to LAD being patent, Circumflex coronary artery is super dominant. H/O  Left main stenting in 2015 and in 2016 he had complex PCI to in-stent restenotic circumflex/OM bifurcation, due to recurrent restenosis, underwent stenting to left main and circumflex proximal and midsegment with implantation of 2 overlapping 3.5 x 20 and 3.0 x 20 mm  Synergy DES on 08/16/2017 when he presented with decompensated heart failure and recurrent VT.    Similar presentation on 01/03/2019, coronary angiography on 01/05/2019 revealing severely restenotic left main stent that extends into the circumflex proximal and also old mid circumflex stent and successful Wolverine 3.5 mm cutting balloon angioplasty on 01/11/19. He also had slow VT but stable hemodynamics at presentation. He is on chronic amiodarone.   Past medical history significant for uncontrolled diabetes mellitus, hypertension, chronic systolic and diastolic heart failure, stage III chronic kidney disease, obstructive sleep apnea on CPAP.  He had been doing well until last evening, after watching TV, he felt nauseous and slightly febrile.  He went to the bathroom and then this was followed by acute onset of marked dyspnea.  EMS was activated and patient was brought to the emergency room in florid pulmonary edema.  This morning he feels well, he is laying flat in the bed, he is not in any acute respiratory distress.  He has diuresed well overnight.  He did not have any chest pain  or palpitations.  Past Medical History:  Diagnosis Date  . AICD (automatic cardioverter/defibrillator) present    Medtronic  . Arthritis    "thumbs"  (08/02/2017)  . CHF (congestive heart failure) ([place])   . Chronic combined systolic and diastolic heart failure ([place])    a. 08/2017 Echo: EF 20-25%, mod glob HK. Sev distal ant sept, inflat HK. Apical AK. Gr2 DD. Mildly reduced RV fxn.  . Colon polyps   . Coronary artery disease    a. s/p CABG x 2 (LIMA->LAD, VG->OM); b. Multiple PCI's to LM/LCX/OM; c. 07/2017 PTCA of LM/LCX w/ early ISR-->repeat PCI/DES to LM (3.5x20 Synergy DES) and LCX (3.0x20 Synergy DES); d. 07/2018 Relook Cath: LM patent stent, LAD 100ost, LCX patent stent, OM1 99/60, LIMA->LAD ok. VG->dLCX 37 (old).  . Depression   . Encounter for assessment of implantable cardioverter-defibrillator (ICD) 09/27/2018  . High cholesterol   . Hypertension   . ICD; Biventricular  Medtronic ICD Amplia MRI QWuad CRT-D  in situ 10/29/14 10/29/2014   Remote ICD check 09.23.20:  One 6 beat NSVT. No therapy.  1 SVT episode @ 130 bpm (38 Sec).  There were 23 Vent sense episodes detected for up to 1.1 min/day (AT). Health trends (patient activity, heart rate variability, average heart rates) are stable.Trans-thoracic impedance trends and the OptiVol Fluid Index do no present significant abnormalities. Battery longevity is 4.3 years. RA pacing is 3  . Ischemic cardiomyopathy 09/27/2018  . MI (myocardial infarction) ([place]) 2003  . NSTEMI (non-ST elevated myocardial infarction) ([place]) 07/16/2014  . OSA on CPAP   .  Oxygen deficiency   . Pneumonia 10/2014  . Proteinuria   . Sleep apnea   . Type II diabetes mellitus ([facility])    insulin dependent   Past Surgical History:  Procedure Laterality Date  . BIOPSY  09/19/2018   Procedure: BIOPSY;  Surgeon: Thornton Park, MD;  Location: [facility];  Service: Gastroenterology;;  . CARDIAC CATHETERIZATION N/A 07/16/2014   Procedure: Left Heart Cath and Coronary  Angiography;  Surgeon: Adrian Prows, MD;  Location: [place] [facility];  Service: Cardiovascular;  Laterality: N/A;  . CARDIAC CATHETERIZATION  07/16/2014   Procedure: Coronary Balloon Angioplasty;  Surgeon: Adrian Prows, MD;  Location: [place] [facility];  Service: Cardiovascular;;  . CARDIAC CATHETERIZATION  2003  . CARDIAC DEFIBRILLATOR PLACEMENT  2016  . CATARACT EXTRACTION W/ INTRAOCULAR LENS IMPLANT Left 03/2014  . COLONOSCOPY    . COLONOSCOPY WITH PROPOFOL N/A 09/19/2018   Procedure: COLONOSCOPY WITH PROPOFOL;  Surgeon: Thornton Park, MD;  [facility]: [facility];  Service: Gastroenterology;  Laterality: N/A;  . CORONARY ANGIOPLASTY WITH STENT PLACEMENT     "I've got a total of 8 stents in there; mostly doine at [facility]" (08/02/2017)  . CORONARY ARTERY BYPASS GRAFT  ~ 2003   "CABG X2"; [facility]  . CORONARY ATHERECTOMY N/A 08/16/2017   Procedure: CORONARY ATHERECTOMY;  Surgeon: Nigel Mormon, MD;  Location: [place] [facility];  Service: Cardiovascular;  Laterality: N/A;  . CORONARY BALLOON ANGIOPLASTY N/A 08/04/2017   Procedure: CORONARY BALLOON ANGIOPLASTY;  Surgeon: Adrian Prows, MD;  Location: [place] [facility];  Service: Cardiovascular;  Laterality: N/A;  . CORONARY BALLOON ANGIOPLASTY N/A 01/11/2019   Procedure: CORONARY BALLOON ANGIOPLASTY;  Surgeon: Adrian Prows, MD;  Location: [place] [facility];  Service: Cardiovascular;  Laterality: N/A;  . CORONARY STENT INTERVENTION N/A 08/16/2017   Procedure: CORONARY STENT INTERVENTION;  Surgeon: Nigel Mormon, MD;  Location: [place] [facility];  Service: Cardiovascular;  Laterality: N/A;  . ELBOW SURGERY Left ?2001   "pinched nerve"  . ENDOSCOPIC MUCOSAL RESECTION  12/18/2018   Procedure: ENDOSCOPIC MUCOSAL RESECTION;  Surgeon: Rush Landmark Telford Nab., MD;  Location: [place];  Service: Gastroenterology;;  . Otho Darner SIGMOIDOSCOPY N/A 12/18/2018   Procedure: Beryle Quant;  Surgeon: Irving Copas., MD;   Location: [place];  Service: Gastroenterology;  Laterality: N/A;  . HEMOSTASIS CLIP PLACEMENT  12/18/2018   Procedure: HEMOSTASIS CLIP PLACEMENT;  Surgeon: Irving Copas., MD;  Location: [place];  Service: Gastroenterology;;  . LEFT HEART CATH AND CORONARY ANGIOGRAPHY N/A 01/11/2019   Procedure: LEFT HEART CATH AND CORONARY ANGIOGRAPHY;  Surgeon: Adrian Prows, MD;  Location: [place] [facility];  Service: Cardiovascular;  Laterality: N/A;  . LEFT HEART CATH AND CORS/GRAFTS ANGIOGRAPHY N/A 08/04/2017   Procedure: LEFT HEART CATH AND CORS/GRAFTS ANGIOGRAPHY;  Surgeon: Adrian Prows, MD;  Location: [place] [facility];  Service: Cardiovascular;  Laterality: N/A;  . LEFT HEART CATH AND CORS/GRAFTS ANGIOGRAPHY N/A 08/20/2017   Procedure: LEFT HEART CATH AND CORS/GRAFTS ANGIOGRAPHY;  Surgeon: Nigel Mormon, MD;  Location: [place] [facility];  Service: Cardiovascular;  Laterality: N/A;  . LEFT HEART CATH AND CORS/GRAFTS ANGIOGRAPHY N/A 01/08/2019   Procedure: LEFT HEART CATH AND CORS/GRAFTS ANGIOGRAPHY;  Surgeon: Wellington Hampshire, MD;  Location: [place] [facility];  Service: Cardiovascular;  Laterality: N/A;  . POLYPECTOMY  09/19/2018   Procedure: POLYPECTOMY;  Surgeon: Thornton Park, MD;  [facility];  Service: Gastroenterology;;  . Lia Foyer INJECTION  09/19/2018   Procedure:  SUBMUCOSAL INJECTION;  Surgeon: Thornton Park, MD;  Location[facility];  Service: Gastroenterology;;  . Lia Foyer LIFTING INJECTION  12/18/2018   Procedure: SUBMUCOSAL LIFTING INJECTION;  Surgeon: Irving Copas., MD;  Location: [place];  Service: Gastroenterology;;   Social History   Tobacco Use  . Smoking status: Former Smoker    Packs/day: 0.25    Years: 27.00    Pack years: 6.75    Types: Cigars    Quit date: 2002    Years since quitting: 19.7  . Smokeless tobacco: Former Systems developer    Quit date: 2002  Substance Use Topics  . Alcohol use: Not Currently    Marital  Sttus: Married  ROS  Review of Systems  Constitutional: Positive for fever (felt feverish last night).  Cardiovascular: Positive for dyspnea on exertion and leg swelling. Negative for chest pain.  Gastrointestinal: Negative for melena.  All other systems reviewed and are negative.  Objective   Vitals with BMI 10/09/2019 10/09/2019 10/09/2019  Height - - -  Weight - - -  BMI - - -  Systolic 568 127 517  Diastolic 78 80 72  Pulse 74 72 75    Blood pressure 125/78, pulse 74, temperature 98.3 F (36.8 C), temperature source Oral, resp. rate 17, height 5\' 7"  (1.702 m), weight 99.3 kg, SpO2 99 %.    Physical Exam Cardiovascular:     Rate and Rhythm: Normal rate and regular rhythm.     Pulses: Intact distal pulses.          Carotid pulses are 2+ on the right side and 2+ on the left side.      Femoral pulses are 2+ on the right side and 2+ on the left side.      Dorsalis pedis pulses are 0 on the right side and 0 on the left side.       Posterior tibial pulses are 0 on the right side and 0 on the left side.     Heart sounds: Normal heart sounds. No murmur heard.  No gallop.      Comments: 2+ bilaterl below knee leg edema, difficult to make out JVD due to short neck. Pulmonary:     Effort: Pulmonary effort is normal.     Breath sounds: Examination of the right-middle field reveals rales. Examination of the left-middle field reveals rales. Examination of the right-lower field reveals rales. Examination of the left-lower field reveals rales. Rhonchi and rales present.  Abdominal:     General: Bowel sounds are normal.     Palpations: Abdomen is soft.     Comments: Obese    Laboratory examination:   Recent Labs    04/18/19 1019 10/09/19 0005 10/09/19 0558  NA 135 133* 134*  K 4.7 4.5 4.2  CL 98 98 100  CO2 21 21* 26  GLUCOSE 473* 558* 488*  BUN 31* 23 25*  CREATININE 1.65* 1.59* 1.63*  CALCIUM 9.5 8.7* 8.5*  GFRNONAA 44* 46* 44*  GFRAA 50* 53* 51*   estimated creatinine  clearance is 52.1 mL/min (A) (by C-G formula based on SCr of 1.63 mg/dL (H)).  CMP Latest Ref Rng & Units 10/09/2019 10/09/2019 04/18/2019  Glucose 70 - 99 mg/dL 488(H) 558(HH) 473(H)  BUN 8 - 23 mg/dL 25(H) 23 31(H)  Creatinine 0.61 - 1.24 mg/dL 1.63(H) 1.59(H) 1.65(H)  Sodium 135 - 145 mmol/L 134(L) 133(L) 135  Potassium 3.5 - 5.1 mmol/L 4.2 4.5 4.7  Chloride 98 - 111 mmol/L 100 98 98  CO2 22 - 32 mmol/L 26 21(L) 21  Calcium 8.9 - 10.3 mg/dL 8.5(L) 8.7(L) 9.5  Total Protein 6.5 - 8.1 g/dL 6.2(L) 7.4 6.6  Total Bilirubin 0.3 - 1.2 mg/dL 0.7 1.0 0.3  Alkaline Phos 38 - 126 U/L 118 155(H) 116  AST 15 - 41 U/L 17 23 12   ALT 0 - 44 U/L 15 18 13    CBC Latest Ref Rng & Units 10/09/2019 10/09/2019 01/11/2019  WBC 4.0 - 10.5 K/uL 10.6(H) 17.0(H) 7.9  Hemoglobin 13.0 - 17.0 g/dL 11.7(L) 13.9 11.3(L)  Hematocrit 39 - 52 % 37.1(L) 44.1 33.2(L)  Platelets 150 - 400 K/uL 177 266 181   Lipid Panel Recent Labs    10/30/18 1030 10/30/18 1030 12/27/18 0814 01/04/19 0640 04/18/19 1019  CHOL 144   < > 103 142 127  TRIG 240.0*   < > 182.0* 148 181*  LDLCALC  --   --  34 67 58  VLDL 48.0*  --  36.4 30  --   HDL 39.60   < > 32.10* 45 39*  CHOLHDL 4  --  3 3.2  --   LDLDIRECT 71.0  --   --   --   --    < > = values in this interval not displayed.    HEMOGLOBIN A1C Lab Results  Component Value Date   HGBA1C 12.6 08/07/2019   MPG 263 07/12/2015   TSH Recent Labs    04/18/19 1019  TSH 1.460   BNP (last 3 results) Recent Labs    01/03/19 2133 04/18/19 1019 10/09/19 0005  BNP 1,262.0* 820.8* 2,026.8*    Ref Range & Units 05:58  (10/09/19) 00:05  (10/09/19)      Troponin I (High Sensitivity) <18 ng/L 474High Panic  80High CM        Medications and allergies   Allergies  Allergen Reactions  . Diltiazem Rash     No outpatient medications have been marked as taking for the 10/08/19 encounter [facility] Encounter).   No current facility-administered medications on file prior to  encounter.   Current Outpatient Medications on File Prior to Encounter  Medication Sig Dispense Refill  . acetaminophen (TYLENOL) 325 MG tablet Take 2 tablets (650 mg total) by mouth every 4 (four) hours as needed for headache or mild pain. (Patient not taking: Reported on 08/24/2019)    . amiodarone (PACERONE) 200 MG tablet Take 0.5 tablets (100 mg total) by mouth daily.    [name] Kitchen aspirin EC 81 MG EC tablet Take 1 tablet (81 mg total) by mouth daily.    . carvedilol (COREG) 6.25 MG tablet Take 1 tablet (6.25 mg total) by mouth 2 (two) times daily with a meal.    . Cholecalciferol (VITAMIN D3) 50 MCG (2000 UT) TABS Take by mouth 2 (two) times daily.    . empagliflozin (JARDIANCE) 25 MG TABS tablet Take 25 mg by mouth daily.    [name] Kitchen ezetimibe (ZETIA) 10 MG tablet Take 1 tablet (10 mg total) by mouth at bedtime.    . furosemide (LASIX) 40 MG tablet Take 40 mg by mouth daily. Currently taking ~1/week    . insulin aspart protamine- aspart (NOVOLOG MIX 70/30) (70-30) 100 UNIT/ML injection Inject 0.25 mLs (25 Units total) into the skin 2 (two) times daily with a meal. (Patient taking differently: Inject 63 Units into the skin 2 (two) times daily with a meal. ) 10 mL 11  . isosorbide mononitrate (IMDUR) 30 MG 24 hr tablet Take 1 tablet (30  mg total) by mouth daily.    [name] Kitchen liraglutide (VICTOZA) 18 MG/3ML SOPN Inject 1.8 mg into the skin daily.    . metFORMIN (GLUCOPHAGE) 500 MG tablet Take 1,000 mg by mouth 2 (two) times daily with a meal. Taking 2 tabs twice a day    . nitroGLYCERIN (NITROSTAT) 0.4 MG SL tablet Place 1 tablet (0.4 mg total) under the tongue every 5 (five) minutes x 3 doses as needed for chest pain. (Patient not taking: Reported on 08/24/2019)  12  . Omega-3 Fatty Acids (FISH OIL) 1000 MG CAPS Take 1,000 mg by mouth 2 (two) times daily.     . potassium chloride SA (KLOR-CON) 20 MEQ tablet TAKE 1 TABLET BY MOUTH TWICE A DAY 60 tablet 1  . PRESCRIPTION MEDICATION Inhale into the lungs at bedtime. CPAP     . rosuvastatin (CRESTOR) 40 MG tablet Take 1 tablet (40 mg total) by mouth at bedtime.    . sacubitril-valsartan (ENTRESTO) 97-103 MG Take 1 tablet by mouth 2 (two) times daily.    . sertraline (ZOLOFT) 50 MG tablet Take 1 tablet (50 mg total) by mouth at bedtime.    . ticagrelor (BRILINTA) 90 MG TABS tablet Take 1 tablet (90 mg total) by mouth 2 (two) times daily. 60 tablet      Scheduled Meds: . amiodarone  100 mg Oral Daily  . aspirin EC  81 mg Oral Daily  . carvedilol  6.25 mg Oral BID WC  . ezetimibe  10 mg Oral QHS  . furosemide  60 mg Intravenous BID  . insulin aspart  0-15 Units Subcutaneous TID WC  . insulin glargine  40 Units Subcutaneous Daily  . omega-3 acid ethyl esters  1 g Oral BID  . potassium chloride SA  20 mEq Oral BID  . rosuvastatin  40 mg Oral QHS  . sacubitril-valsartan  1 tablet Oral BID  . sertraline  50 mg Oral QHS  . ticagrelor  90 mg Oral BID   Continuous Infusions: . heparin 1,150 Units/hr (10/09/19 0746)  . nitroGLYCERIN 75 mcg/min (10/09/19 0200)   PRN Meds:.ondansetron **OR** ondansetron (ZOFRAN) IV   I/O last 3 completed shifts: In: -  Out: 1000 [Urine:1000] No intake/output data recorded.   Radiology:   DG Chest Portable 1 View  Result Date: 10/09/2019 CLINICAL DATA:  Shortness of breath EXAM: PORTABLE CHEST 1 VIEW COMPARISON:  01/03/2019 FINDINGS: Unchanged cardiomegaly. Worsened bilateral interstitial opacities. Remote median sternotomy with unchanged position of left chest wall 3 lead AICD. IMPRESSION: Cardiomegaly and worsened bilateral interstitial opacities likely indicating pulmonary edema. Electronically Signed   By: Ulyses Jarred M.D.   On: 10/09/2019 00:19    Cardiac Studies:   Renal artery duplex 09/02/2017: Right: Abnormal right Resistive Index. No evidence of right renal  artery stenosis. Left: Normal left Resistive Index. No evidence of left renal artery stenosis. Mesenteric: Normal Celiac artery findings. 70 to 99%  stenosis in the superior mesenteric artery.  Left Heart Catheterization12/14/2020:  1. Severe underlying three-vessel coronary artery disease with patent LIMA to LAD. Chronically occluded SVG to OM. Severe in-stent restenosis in left main stent extending into the proximal left circumflex. In addition, there is significant restenosis in the stent placed in the left posterior AV groove artery. The RCA is known to be small in size and severely diseased. 2. Left ventricular angiography was not performed due to chronic kidney disease. LVEDP was 13 mmHg.  Coronary angioplasty of the left main and mid circumflex coronary artery 01/11/2019: Successful  Wolverine cutting balloon angioplasty of the left main, 3.5 x 10 mm balloon utilized and high-pressure 12 atmospheric pressure inflations performed throughout the in-stent restenotic left main and proximal ostial circumflex and also mid circumflex coronary artery, 99% reduced to 0% with TIMI II to TIMI-3 flow. 60 mill contrast utilized.  Echocardiogram 06/15/2019:  Left ventricle cavity is moderately dilated. Moderate concentric  hypertrophy of the left ventricle. Doppler evidence of grade III  (restrictive) diastolic dysfunction, elevated LAP. Left ventricle regional  wall motion findings: Basal inferolateral akinesis. 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 Mild global hypokinesis. Abnormal septal wall motion due  to post-operative coronary artery bypass graft. Moderately depressed LV  systolic function with visual EF 35-40%.  Left atrial cavity is mildly dilated at 4.3 cm.  Right ventricle cavity is normal in size. Normal right ventricular  function. Pacemaker lead/ICD lead noted in the RV.  Trileaflet aortic valve. Mild (Grade I) aortic regurgitation.  Compared to 01/04/2019, EF improved from 25-30%  Scheduled Remote ICD check 09/19/2019:  There were 0 atrial high rate episodes detected. There were 0 VF episodes, 0 FVT episodes, 0 VT episodes and  1 nonsustained episode of ventricular tachycardia (1 Sec duration). Health trends (patient activity, heart rate variability, average heart rates) are stable. Trans-thoracic impedance trends and the Optivol Fluid Index do no present significant abnormalities. Battery longevity is 2.7 years. RA pacing is 34.9 %, RV pacing is 96.4 %, and LV pacing is 99.3 %.  EKG:  EKG 10/09/2019: Sinus rhythm with ventricularly paced rhythm.  Biventricular pacemaker detected.  No further analysis.  Assessment   1.  Flash pulmonary edema 2.  Coronary artery disease of the native vessel and also bypass graft with unstable angina/NSTEMI 3.  Diabetes mellitus type 2 uncontrolled without hyperglycemia 4.  Stage III chronic kidney disease with diabetes mellitus as a complication 5.  Hyperlipidemia Medications Discontinued During This Encounter  Medication Reason  . enoxaparin (LOVENOX) injection 40 mg   . insulin aspart (novoLOG) injection 0-9 Units     Recommendations:   Josuel Koeppen  is a 63 y.o. Caucasian male  with CAD and history of CABG x2 in 2002 with only LIMA to LAD being patent, Circumflex coronary artery is super dominant. H/O  Left main stenting in 2015 and in 2016 he had complex PCI to in-stent restenotic circumflex/OM bifurcation, due to recurrent restenosis, underwent stenting to left main and circumflex proximal and midsegment with implantation of 2 overlapping 3.5 x 20 and 3.0 x 20 mm  Synergy DES on 08/16/2017 when he presented with decompensated heart failure and recurrent VT.    Similar presentation on 01/03/2019, coronary angiography on 01/05/2019 revealing severely restenotic left main stent that extends into the circumflex proximal and also old mid circumflex stent and successful Wolverine 3.5 mm cutting balloon angioplasty on 01/11/19. He also had slow VT but stable hemodynamics at presentation. He is on chronic amiodarone.   Past medical history significant for uncontrolled diabetes mellitus,  hypertension, chronic systolic and diastolic heart failure, stage III chronic kidney disease, obstructive sleep apnea on CPAP.  He is now presenting with acute onset shortness of breath with flash pulmonary edema, I suspect recurrent restenosis in the left main coronary artery.  I will schedule him for cardiac catheterization this afternoon.  Continue IV heparin and IV nitroglycerin for now, otherwise he is on appropriate medical therapy.  We will make further recommendations following angiography.  Schedule for cardiac catheterization, and possible angioplasty. We discussed regarding risks, benefits, alternatives to  this including stress testing, CTA and continued medical therapy. Patient wants to proceed. Understands <1-2% risk of death, stroke, MI, urgent CABG, bleeding, infection, renal failure but not limited to these.  I have discussed again regarding weight loss and diabetes control.  Blood pressure is well controlled, lipids are also well controlled.   Adrian Prows, MD, [facility] 10/09/2019, 8:18 AM Office: [phone]

## 2019-10-09 NOTE — H&P (View-Only) (Signed)
CARDIOLOGY CONSULT NOTE  Patient ID: Osman Calzadilla MRN: [id number] DOB/AGE: 07/26/1955 64 y.o.  Admit date: 10/08/2019 Referring Physician  Doylene Bode, MD Primary Physician:  [name], MD Reason for Consultation  CHF  Patient ID: Rajveer Handler, male    DOB: [birth year], 64 y.o.   MRN: [id number]  Chief Complaint  Patient presents with  . Shortness of Breath   HPI:    Derak Schurman  is a 64 y.o. Caucasian male  with CAD and history of CABG x2 in 2002 with only LIMA to LAD being patent, Circumflex coronary artery is super dominant. H/O  Left main stenting in 2015 and in 2016 he had complex PCI to in-stent restenotic circumflex/OM bifurcation, due to recurrent restenosis, underwent stenting to left main and circumflex proximal and midsegment with implantation of 2 overlapping 3.5 x 20 and 3.0 x 20 mm  Synergy DES on 08/16/2017 when he presented with decompensated heart failure and recurrent VT.    Similar presentation on 01/03/2019, coronary angiography on 01/05/2019 revealing severely restenotic left main stent that extends into the circumflex proximal and also old mid circumflex stent and successful Wolverine 3.5 mm cutting balloon angioplasty on 01/11/19. He also had slow VT but stable hemodynamics at presentation. He is on chronic amiodarone.   Past medical history significant for uncontrolled diabetes mellitus, hypertension, chronic systolic and diastolic heart failure, stage III chronic kidney disease, obstructive sleep apnea on CPAP.  He had been doing well until last evening, after watching TV, he felt nauseous and slightly febrile.  He went to the bathroom and then this was followed by acute onset of marked dyspnea.  EMS was activated and patient was brought to the emergency room in florid pulmonary edema.  This morning he feels well, he is laying flat in the bed, he is not in any acute respiratory distress.  He has diuresed well overnight.  He did not have any chest pain  or palpitations.  Past Medical History:  Diagnosis Date  . AICD (automatic cardioverter/defibrillator) present    Medtronic  . Arthritis    "thumbs"  (08/02/2017)  . CHF (congestive heart failure) ([place])   . Chronic combined systolic and diastolic heart failure ([place])    a. 08/2017 Echo: EF 20-25%, mod glob HK. Sev distal ant sept, inflat HK. Apical AK. Gr2 DD. Mildly reduced RV fxn.  . Colon polyps   . Coronary artery disease    a. s/p CABG x 2 (LIMA->LAD, VG->OM); b. Multiple PCI's to LM/LCX/OM; c. 07/2017 PTCA of LM/LCX w/ early ISR-->repeat PCI/DES to LM (3.5x20 Synergy DES) and LCX (3.0x20 Synergy DES); d. 07/2018 Relook Cath: LM patent stent, LAD 100ost, LCX patent stent, OM1 99/60, LIMA->LAD ok. VG->dLCX 43 (old).  . Depression   . Encounter for assessment of implantable cardioverter-defibrillator (ICD) 09/27/2018  . High cholesterol   . Hypertension   . ICD; Biventricular  Medtronic ICD Amplia MRI QWuad CRT-D  in situ 10/29/14 10/29/2014   Remote ICD check 09.23.20:  One 6 beat NSVT. No therapy.  1 SVT episode @ 130 bpm (38 Sec).  There were 23 Vent sense episodes detected for up to 1.1 min/day (AT). Health trends (patient activity, heart rate variability, average heart rates) are stable.Trans-thoracic impedance trends and the OptiVol Fluid Index do no present significant abnormalities. Battery longevity is 4.3 years. RA pacing is 3  . Ischemic cardiomyopathy 09/27/2018  . MI (myocardial infarction) ([place]) 2003  . NSTEMI (non-ST elevated myocardial infarction) ([place]) 07/16/2014  . OSA on CPAP   .  Oxygen deficiency   . Pneumonia 10/2014  . Proteinuria   . Sleep apnea   . Type II diabetes mellitus (H[facility])    insulin dependent   Past Surgical History:  Procedure Laterality Date  . BIOPSY  09/19/2018   Procedure: BIOPSY;  Surgeon: Thornton Park, MD;  Location: [facility];  Service: Gastroenterology;;  . CARDIAC CATHETERIZATION N/A 07/16/2014   Procedure: Left Heart Cath and Coronary  Angiography;  Surgeon: Adrian Prows, MD;  Location: [place] [facility];  Service: Cardiovascular;  Laterality: N/A;  . CARDIAC CATHETERIZATION  07/16/2014   Procedure: Coronary Balloon Angioplasty;  Surgeon: Adrian Prows, MD;  Location: [place] [facility];  Service: Cardiovascular;;  . CARDIAC CATHETERIZATION  2003  . CARDIAC DEFIBRILLATOR PLACEMENT  2016  . CATARACT EXTRACTION W/ INTRAOCULAR LENS IMPLANT Left 03/2014  . COLONOSCOPY    . COLONOSCOPY WITH PROPOFOL N/A 09/19/2018   Procedure: COLONOSCOPY WITH PROPOFOL;  Surgeon: Thornton Park, MD;  Location: [facility];  Service: Gastroenterology;  Laterality: N/A;  . CORONARY ANGIOPLASTY WITH STENT PLACEMENT     "I've got a total of 8 stents in there; mostly doine at [facility] [place]" (08/02/2017)  . CORONARY ARTERY BYPASS GRAFT  ~ 2003   "CABG X2"; [facility]  . CORONARY ATHERECTOMY N/A 08/16/2017   Procedure: CORONARY ATHERECTOMY;  Surgeon: Nigel Mormon, MD;  Location: [place] [facility];  Service: Cardiovascular;  Laterality: N/A;  . CORONARY BALLOON ANGIOPLASTY N/A 08/04/2017   Procedure: CORONARY BALLOON ANGIOPLASTY;  Surgeon: Adrian Prows, MD;  Location: [place] [facility];  Service: Cardiovascular;  Laterality: N/A;  . CORONARY BALLOON ANGIOPLASTY N/A 01/11/2019   Procedure: CORONARY BALLOON ANGIOPLASTY;  Surgeon: Adrian Prows, MD;  Location: [place] [facility];  Service: Cardiovascular;  Laterality: N/A;  . CORONARY STENT INTERVENTION N/A 08/16/2017   Procedure: CORONARY STENT INTERVENTION;  Surgeon: Nigel Mormon, MD;  Location: [place] [facility];  Service: Cardiovascular;  Laterality: N/A;  . ELBOW SURGERY Left ?2001   "pinched nerve"  . ENDOSCOPIC MUCOSAL RESECTION  12/18/2018   Procedure: ENDOSCOPIC MUCOSAL RESECTION;  Surgeon: Rush Landmark Telford Nab., MD;  Location: [place];  Service: Gastroenterology;;  . Otho Darner SIGMOIDOSCOPY N/A 12/18/2018   Procedure: Beryle Quant;  Surgeon: Irving Copas., MD;   Location: [place];  Service: Gastroenterology;  Laterality: N/A;  . HEMOSTASIS CLIP PLACEMENT  12/18/2018   Procedure: HEMOSTASIS CLIP PLACEMENT;  Surgeon: Irving Copas., MD;  Location: [place];  Service: Gastroenterology;;  . LEFT HEART CATH AND CORONARY ANGIOGRAPHY N/A 01/11/2019   Procedure: LEFT HEART CATH AND CORONARY ANGIOGRAPHY;  Surgeon: Adrian Prows, MD;  Location: [place] [facility];  Service: Cardiovascular;  Laterality: N/A;  . LEFT HEART CATH AND CORS/GRAFTS ANGIOGRAPHY N/A 08/04/2017   Procedure: LEFT HEART CATH AND CORS/GRAFTS ANGIOGRAPHY;  Surgeon: Adrian Prows, MD;  Location: [place] [facility];  Service: Cardiovascular;  Laterality: N/A;  . LEFT HEART CATH AND CORS/GRAFTS ANGIOGRAPHY N/A 08/20/2017   Procedure: LEFT HEART CATH AND CORS/GRAFTS ANGIOGRAPHY;  Surgeon: Nigel Mormon, MD;  Location: [place] [facility];  Service: Cardiovascular;  Laterality: N/A;  . LEFT HEART CATH AND CORS/GRAFTS ANGIOGRAPHY N/A 01/08/2019   Procedure: LEFT HEART CATH AND CORS/GRAFTS ANGIOGRAPHY;  Surgeon: Wellington Hampshire, MD;  Location: [place] [facility];  Service: Cardiovascular;  Laterality: N/A;  . POLYPECTOMY  09/19/2018   Procedure: POLYPECTOMY;  Surgeon: Thornton Park, MD;  [facility];  Service: Gastroenterology;;  . Lia Foyer INJECTION  09/19/2018   Procedure:  SUBMUCOSAL INJECTION;  Surgeon: Thornton Park, MD;  Location: [facility];  Service: Gastroenterology;;  . Lia Foyer LIFTING INJECTION  12/18/2018   Procedure: SUBMUCOSAL LIFTING INJECTION;  Surgeon: Irving Copas., MD;  Location: [place];  Service: Gastroenterology;;   Social History   Tobacco Use  . Smoking status: Former Smoker    Packs/day: 0.25    Years: 27.00    Pack years: 6.75    Types: Cigars    Quit date: 2002    Years since quitting: 19.7  . Smokeless tobacco: Former Systems developer    Quit date: 2002  Substance Use Topics  . Alcohol use: Not Currently    Marital  Sttus: Married  ROS  Review of Systems  Constitutional: Positive for fever (felt feverish last night).  Cardiovascular: Positive for dyspnea on exertion and leg swelling. Negative for chest pain.  Gastrointestinal: Negative for melena.  All other systems reviewed and are negative.  Objective   Vitals with BMI 10/09/2019 10/09/2019 10/09/2019  Height - - -  Weight - - -  BMI - - -  Systolic 283 662 947  Diastolic 78 80 72  Pulse 74 72 75    Blood pressure 125/78, pulse 74, temperature 98.3 F (36.8 C), temperature source Oral, resp. rate 17, height 5\' 7"  (1.702 m), weight 99.3 kg, SpO2 99 %.    Physical Exam Cardiovascular:     Rate and Rhythm: Normal rate and regular rhythm.     Pulses: Intact distal pulses.          Carotid pulses are 2+ on the right side and 2+ on the left side.      Femoral pulses are 2+ on the right side and 2+ on the left side.      Dorsalis pedis pulses are 0 on the right side and 0 on the left side.       Posterior tibial pulses are 0 on the right side and 0 on the left side.     Heart sounds: Normal heart sounds. No murmur heard.  No gallop.      Comments: 2+ bilaterl below knee leg edema, difficult to make out JVD due to short neck. Pulmonary:     Effort: Pulmonary effort is normal.     Breath sounds: Examination of the right-middle field reveals rales. Examination of the left-middle field reveals rales. Examination of the right-lower field reveals rales. Examination of the left-lower field reveals rales. Rhonchi and rales present.  Abdominal:     General: Bowel sounds are normal.     Palpations: Abdomen is soft.     Comments: Obese    Laboratory examination:   Recent Labs    04/18/19 1019 10/09/19 0005 10/09/19 0558  NA 135 133* 134*  K 4.7 4.5 4.2  CL 98 98 100  CO2 21 21* 26  GLUCOSE 473* 558* 488*  BUN 31* 23 25*  CREATININE 1.65* 1.59* 1.63*  CALCIUM 9.5 8.7* 8.5*  GFRNONAA 44* 46* 44*  GFRAA 50* 53* 51*   estimated creatinine  clearance is 52.1 mL/min (A) (by C-G formula based on SCr of 1.63 mg/dL (H)).  CMP Latest Ref Rng & Units 10/09/2019 10/09/2019 04/18/2019  Glucose 70 - 99 mg/dL 488(H) 558(HH) 473(H)  BUN 8 - 23 mg/dL 25(H) 23 31(H)  Creatinine 0.61 - 1.24 mg/dL 1.63(H) 1.59(H) 1.65(H)  Sodium 135 - 145 mmol/L 134(L) 133(L) 135  Potassium 3.5 - 5.1 mmol/L 4.2 4.5 4.7  Chloride 98 - 111 mmol/L 100 98 98  CO2 22 - 32 mmol/L 26 21(L) 21  Calcium 8.9 - 10.3 mg/dL 8.5(L) 8.7(L) 9.5  Total Protein 6.5 - 8.1 g/dL 6.2(L) 7.4 6.6  Total Bilirubin 0.3 - 1.2 mg/dL 0.7 1.0 0.3  Alkaline Phos 38 - 126 U/L 118 155(H) 116  AST 15 - 41 U/L 17 23 12   ALT 0 - 44 U/L 15 18 13    CBC Latest Ref Rng & Units 10/09/2019 10/09/2019 01/11/2019  WBC 4.0 - 10.5 K/uL 10.6(H) 17.0(H) 7.9  Hemoglobin 13.0 - 17.0 g/dL 11.7(L) 13.9 11.3(L)  Hematocrit 39 - 52 % 37.1(L) 44.1 33.2(L)  Platelets 150 - 400 K/uL 177 266 181   Lipid Panel Recent Labs    10/30/18 1030 10/30/18 1030 12/27/18 0814 01/04/19 0640 04/18/19 1019  CHOL 144   < > 103 142 127  TRIG 240.0*   < > 182.0* 148 181*  LDLCALC  --   --  34 67 58  VLDL 48.0*  --  36.4 30  --   HDL 39.60   < > 32.10* 45 39*  CHOLHDL 4  --  3 3.2  --   LDLDIRECT 71.0  --   --   --   --    < > = values in this interval not displayed.    HEMOGLOBIN A1C Lab Results  Component Value Date   HGBA1C 12.6 08/07/2019   MPG 263 07/12/2015   TSH Recent Labs    04/18/19 1019  TSH 1.460   BNP (last 3 results) Recent Labs    01/03/19 2133 04/18/19 1019 10/09/19 0005  BNP 1,262.0* 820.8* 2,026.8*    Ref Range & Units 05:58  (10/09/19) 00:05  (10/09/19)      Troponin I (High Sensitivity) <18 ng/L 474High Panic  80High CM        Medications and allergies   Allergies  Allergen Reactions  . Diltiazem Rash     No outpatient medications have been marked as taking for the 10/08/19 encounter [facility] Encounter).   No current facility-administered medications on file prior to  encounter.   Current Outpatient Medications on File Prior to Encounter  Medication Sig Dispense Refill  . acetaminophen (TYLENOL) 325 MG tablet Take 2 tablets (650 mg total) by mouth every 4 (four) hours as needed for headache or mild pain. (Patient not taking: Reported on 08/24/2019)    . amiodarone (PACERONE) 200 MG tablet Take 0.5 tablets (100 mg total) by mouth daily.    [name] Kitchen aspirin EC 81 MG EC tablet Take 1 tablet (81 mg total) by mouth daily.    . carvedilol (COREG) 6.25 MG tablet Take 1 tablet (6.25 mg total) by mouth 2 (two) times daily with a meal.    . Cholecalciferol (VITAMIN D3) 50 MCG (2000 UT) TABS Take by mouth 2 (two) times daily.    . empagliflozin (JARDIANCE) 25 MG TABS tablet Take 25 mg by mouth daily.    [name] Kitchen ezetimibe (ZETIA) 10 MG tablet Take 1 tablet (10 mg total) by mouth at bedtime.    . furosemide (LASIX) 40 MG tablet Take 40 mg by mouth daily. Currently taking ~1/week    . insulin aspart protamine- aspart (NOVOLOG MIX 70/30) (70-30) 100 UNIT/ML injection Inject 0.25 mLs (25 Units total) into the skin 2 (two) times daily with a meal. (Patient taking differently: Inject 63 Units into the skin 2 (two) times daily with a meal. ) 10 mL 11  . isosorbide mononitrate (IMDUR) 30 MG 24 hr tablet Take 1 tablet (30  mg total) by mouth daily.    [name] Kitchen liraglutide (VICTOZA) 18 MG/3ML SOPN Inject 1.8 mg into the skin daily.    . metFORMIN (GLUCOPHAGE) 500 MG tablet Take 1,000 mg by mouth 2 (two) times daily with a meal. Taking 2 tabs twice a day    . nitroGLYCERIN (NITROSTAT) 0.4 MG SL tablet Place 1 tablet (0.4 mg total) under the tongue every 5 (five) minutes x 3 doses as needed for chest pain. (Patient not taking: Reported on 08/24/2019)  12  . Omega-3 Fatty Acids (FISH OIL) 1000 MG CAPS Take 1,000 mg by mouth 2 (two) times daily.     . potassium chloride SA (KLOR-CON) 20 MEQ tablet TAKE 1 TABLET BY MOUTH TWICE A DAY 60 tablet 1  . PRESCRIPTION MEDICATION Inhale into the lungs at bedtime. CPAP     . rosuvastatin (CRESTOR) 40 MG tablet Take 1 tablet (40 mg total) by mouth at bedtime.    . sacubitril-valsartan (ENTRESTO) 97-103 MG Take 1 tablet by mouth 2 (two) times daily.    . sertraline (ZOLOFT) 50 MG tablet Take 1 tablet (50 mg total) by mouth at bedtime.    . ticagrelor (BRILINTA) 90 MG TABS tablet Take 1 tablet (90 mg total) by mouth 2 (two) times daily. 60 tablet      Scheduled Meds: . amiodarone  100 mg Oral Daily  . aspirin EC  81 mg Oral Daily  . carvedilol  6.25 mg Oral BID WC  . ezetimibe  10 mg Oral QHS  . furosemide  60 mg Intravenous BID  . insulin aspart  0-15 Units Subcutaneous TID WC  . insulin glargine  40 Units Subcutaneous Daily  . omega-3 acid ethyl esters  1 g Oral BID  . potassium chloride SA  20 mEq Oral BID  . rosuvastatin  40 mg Oral QHS  . sacubitril-valsartan  1 tablet Oral BID  . sertraline  50 mg Oral QHS  . ticagrelor  90 mg Oral BID   Continuous Infusions: . heparin 1,150 Units/hr (10/09/19 0746)  . nitroGLYCERIN 75 mcg/min (10/09/19 0200)   PRN Meds:.ondansetron **OR** ondansetron (ZOFRAN) IV   I/O last 3 completed shifts: In: -  Out: 1000 [Urine:1000] No intake/output data recorded.   Radiology:   DG Chest Portable 1 View  Result Date: 10/09/2019 CLINICAL DATA:  Shortness of breath EXAM: PORTABLE CHEST 1 VIEW COMPARISON:  01/03/2019 FINDINGS: Unchanged cardiomegaly. Worsened bilateral interstitial opacities. Remote median sternotomy with unchanged position of left chest wall 3 lead AICD. IMPRESSION: Cardiomegaly and worsened bilateral interstitial opacities likely indicating pulmonary edema. Electronically Signed   By: Ulyses Jarred M.D.   On: 10/09/2019 00:19    Cardiac Studies:   Renal artery duplex 09/02/2017: Right: Abnormal right Resistive Index. No evidence of right renal  artery stenosis. Left: Normal left Resistive Index. No evidence of left renal artery stenosis. Mesenteric: Normal Celiac artery findings. 70 to 99%  stenosis in the superior mesenteric artery.  Left Heart Catheterization12/14/2020:  1. Severe underlying three-vessel coronary artery disease with patent LIMA to LAD. Chronically occluded SVG to OM. Severe in-stent restenosis in left main stent extending into the proximal left circumflex. In addition, there is significant restenosis in the stent placed in the left posterior AV groove artery. The RCA is known to be small in size and severely diseased. 2. Left ventricular angiography was not performed due to chronic kidney disease. LVEDP was 13 mmHg.  Coronary angioplasty of the left main and mid circumflex coronary artery 01/11/2019: Successful  Wolverine cutting balloon angioplasty of the left main, 3.5 x 10 mm balloon utilized and high-pressure 12 atmospheric pressure inflations performed throughout the in-stent restenotic left main and proximal ostial circumflex and also mid circumflex coronary artery, 99% reduced to 0% with TIMI II to TIMI-3 flow. 60 mill contrast utilized.  Echocardiogram 06/15/2019:  Left ventricle cavity is moderately dilated. Moderate concentric  hypertrophy of the left ventricle. Doppler evidence of grade III  (restrictive) diastolic dysfunction, elevated LAP. Left ventricle regional  wall motion findings: Basal inferolateral akinesis. 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 Mild global hypokinesis. Abnormal septal wall motion due  to post-operative coronary artery bypass graft. Moderately depressed LV  systolic function with visual EF 35-40%.  Left atrial cavity is mildly dilated at 4.3 cm.  Right ventricle cavity is normal in size. Normal right ventricular  function. Pacemaker lead/ICD lead noted in the RV.  Trileaflet aortic valve. Mild (Grade I) aortic regurgitation.  Compared to 01/04/2019, EF improved from 25-30%  Scheduled Remote ICD check 09/19/2019:  There were 0 atrial high rate episodes detected. There were 0 VF episodes, 0 FVT episodes, 0 VT episodes and  1 nonsustained episode of ventricular tachycardia (1 Sec duration). Health trends (patient activity, heart rate variability, average heart rates) are stable. Trans-thoracic impedance trends and the Optivol Fluid Index do no present significant abnormalities. Battery longevity is 2.7 years. RA pacing is 34.9 %, RV pacing is 96.4 %, and LV pacing is 99.3 %.  EKG:  EKG 10/09/2019: Sinus rhythm with ventricularly paced rhythm.  Biventricular pacemaker detected.  No further analysis.  Assessment   1.  Flash pulmonary edema 2.  Coronary artery disease of the native vessel and also bypass graft with unstable angina/NSTEMI 3.  Diabetes mellitus type 2 uncontrolled without hyperglycemia 4.  Stage III chronic kidney disease with diabetes mellitus as a complication 5.  Hyperlipidemia Medications Discontinued During This Encounter  Medication Reason  . enoxaparin (LOVENOX) injection 40 mg   . insulin aspart (novoLOG) injection 0-9 Units     Recommendations:   Janos Shampine  is a 64 y.o. Caucasian male  with CAD and history of CABG x2 in 2002 with only LIMA to LAD being patent, Circumflex coronary artery is super dominant. H/O  Left main stenting in 2015 and in 2016 he had complex PCI to in-stent restenotic circumflex/OM bifurcation, due to recurrent restenosis, underwent stenting to left main and circumflex proximal and midsegment with implantation of 2 overlapping 3.5 x 20 and 3.0 x 20 mm  Synergy DES on 08/16/2017 when he presented with decompensated heart failure and recurrent VT.    Similar presentation on 01/03/2019, coronary angiography on 01/05/2019 revealing severely restenotic left main stent that extends into the circumflex proximal and also old mid circumflex stent and successful Wolverine 3.5 mm cutting balloon angioplasty on 01/11/19. He also had slow VT but stable hemodynamics at presentation. He is on chronic amiodarone.   Past medical history significant for uncontrolled diabetes mellitus,  hypertension, chronic systolic and diastolic heart failure, stage III chronic kidney disease, obstructive sleep apnea on CPAP.  He is now presenting with acute onset shortness of breath with flash pulmonary edema, I suspect recurrent restenosis in the left main coronary artery.  I will schedule him for cardiac catheterization this afternoon.  Continue IV heparin and IV nitroglycerin for now, otherwise he is on appropriate medical therapy.  We will make further recommendations following angiography.  Schedule for cardiac catheterization, and possible angioplasty. We discussed regarding risks, benefits, alternatives to  this including stress testing, CTA and continued medical therapy. Patient wants to proceed. Understands <1-2% risk of death, stroke, MI, urgent CABG, bleeding, infection, renal failure but not limited to these.  I have discussed again regarding weight loss and diabetes control.  Blood pressure is well controlled, lipids are also well controlled.   Adrian Prows, MD, [facility] 10/09/2019, 8:18 AM Office: [phone]

## 2019-10-09 NOTE — ED Provider Notes (Signed)
Emergency Department Provider Note  I have reviewed the triage vital signs and the nursing notes.  HISTORY  Chief Complaint Shortness of Breath   HPI Martin Bush is a 64 y.o. male who presents via EMS secondary to acute onset of generalized weakness and respiratory distress.  Apparently patient was in his normal state of health.  No illnesses, fevers.  He is fully vaccinated against Covid.  He has been taking all his medications and eating appropriately.  Approximate hour prior to arrival here patient had acute onset of generalized weakness and change in mental status.  Patient became progressively more altered on arrival paramedics found oxygen saturation in the 70s.  Patient was found to have crackles in his lungs, mild lower extremity edema, increased respiratory rate, increased blood pressure.  Patient was started on BiPAP and IV was obtained in the left hand and brought here for further evaluation.   No other associated or modifying symptoms.   Level 5 caveat applies on initial evaluation secondary to respiratory distress and altered mental status.  Past Medical History:  Diagnosis Date  . AICD (automatic cardioverter/defibrillator) present    Medtronic  . Arthritis    "thumbs"  (08/02/2017)  . CHF (congestive heart failure) ([place])   . Chronic combined systolic and diastolic heart failure ([place])    a. 08/2017 Echo: EF 20-25%, mod glob HK. Sev distal ant sept, inflat HK. Apical AK. Gr2 DD. Mildly reduced RV fxn.  . Colon polyps   . Coronary artery disease    a. s/p CABG x 2 (LIMA->LAD, VG->OM); b. Multiple PCI's to LM/LCX/OM; c. 07/2017 PTCA of LM/LCX w/ early ISR-->repeat PCI/DES to LM (3.5x20 Synergy DES) and LCX (3.0x20 Synergy DES); d. 07/2018 Relook Cath: LM patent stent, LAD 100ost, LCX patent stent, OM1 99/60, LIMA->LAD ok. VG->dLCX 37 (old).  . Depression   . Encounter for assessment of implantable cardioverter-defibrillator (ICD) 09/27/2018  . High cholesterol   .  Hypertension   . ICD; Biventricular  Medtronic ICD Amplia MRI QWuad CRT-D  in situ 10/29/14 10/29/2014   Remote ICD check 09.23.20:  One 6 beat NSVT. No therapy.  1 SVT episode @ 130 bpm (38 Sec).  There were 23 Vent sense episodes detected for up to 1.1 min/day (AT). Health trends (patient activity, heart rate variability, average heart rates) are stable.Trans-thoracic impedance trends and the OptiVol Fluid Index do no present significant abnormalities. Battery longevity is 4.3 years. RA pacing is 3  . Ischemic cardiomyopathy 09/27/2018  . MI (myocardial infarction) ([place]) 2003  . NSTEMI (non-ST elevated myocardial infarction) ([place]) 07/16/2014  . OSA on CPAP   . Oxygen deficiency   . Pneumonia 10/2014  . Proteinuria   . Sleep apnea   . Type II diabetes mellitus ([facility])    insulin dependent    Patient Active Problem List   Diagnosis Date Noted  . NSTEMI (non-ST elevated myocardial infarction) ([place]) 01/04/2019  . Hx of CABG 01/04/2019  . Acute respiratory failure ([place]) 01/04/2019  . 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 History of colonic polyps 11/12/2018  . Abnormal colonoscopy 11/12/2018  . Ischemic cardiomyopathy 09/27/2018  . Encounter for assessment of implantable cardioverter-defibrillator (ICD) 09/27/2018  . Acute on chronic combined systolic and diastolic CHF (congestive heart failure) ([place]) 09/27/2018  . Enrolled in clinical trial of drug 06/13/2018  . Weight loss 09/19/2017  . Ventricular tachycardia ([place])   . ICD (implantable cardioverter-defibrillator) discharge 08/02/2017  . Obesity 06/17/2017  . CHF (congestive heart failure) ([place]) 07/23/2015  .  Hyperlipidemia 07/11/2015  . HTN (hypertension), benign 07/11/2015  . Coronary artery disease involving autologous artery coronary bypass graft without angina pectoris 07/11/2015  . Depression 07/11/2015  . CKD (chronic kidney disease) stage 3, GFR 30-59 ml/min 07/11/2015  . OSA on CPAP 07/11/2015  . Lightheadedness  07/11/2015  . Carotid artery stenosis   . S/P eye surgery 05/15/2015  . Injury of left toe 04/27/2015  . Chronic low back pain 02/13/2015  . Insulin dependent type 2 diabetes mellitus ([place]) 02/13/2015  . Dizziness 02/05/2015  . ICD; Biventricular  Medtronic ICD Amplia MRI QWuad CRT-D  in situ 10/29/14 10/29/2014    Past Surgical History:  Procedure Laterality Date  . BIOPSY  09/19/2018   Procedure: BIOPSY;  Surgeon: Thornton Park, MD;  Location: [facility];  Service: Gastroenterology;;  . CARDIAC CATHETERIZATION N/A 07/16/2014   Procedure: Left Heart Cath and Coronary Angiography;  Surgeon: Adrian Prows, MD;  Location: [place] [facility];  Service: Cardiovascular;  Laterality: N/A;  . CARDIAC CATHETERIZATION  07/16/2014   Procedure: Coronary Balloon Angioplasty;  Surgeon: Adrian Prows, MD;  Location: [place] [facility];  Service: Cardiovascular;;  . CARDIAC CATHETERIZATION  2003  . CARDIAC DEFIBRILLATOR PLACEMENT  2016  . CATARACT EXTRACTION W/ INTRAOCULAR LENS IMPLANT Left 03/2014  . COLONOSCOPY    . COLONOSCOPY WITH PROPOFOL N/A 09/19/2018   Procedure: COLONOSCOPY WITH PROPOFOL;  Surgeon: Thornton Park, MD;  [facility];  Service: Gastroenterology;  Laterality: N/A;  . CORONARY ANGIOPLASTY WITH STENT PLACEMENT     "I've got a total of 8 stents in there; mostly doine at [facility]" (08/02/2017)  . CORONARY ARTERY BYPASS GRAFT  ~ 2003   "CABG X2"; [facility]  . CORONARY ATHERECTOMY N/A 08/16/2017   Procedure: CORONARY ATHERECTOMY;  Surgeon: Nigel Mormon, MD;  Location: [place] [facility];  Service: Cardiovascular;  Laterality: N/A;  . CORONARY BALLOON ANGIOPLASTY N/A 08/04/2017   Procedure: CORONARY BALLOON ANGIOPLASTY;  Surgeon: Adrian Prows, MD;  Location: [place] [facility];  Service: Cardiovascular;  Laterality: N/A;  . CORONARY BALLOON ANGIOPLASTY N/A 01/11/2019   Procedure: CORONARY BALLOON ANGIOPLASTY;  Surgeon: Adrian Prows, MD;  Location: [place] [facility];   Service: Cardiovascular;  Laterality: N/A;  . CORONARY STENT INTERVENTION N/A 08/16/2017   Procedure: CORONARY STENT INTERVENTION;  Surgeon: Nigel Mormon, MD;  Location: [place] [facility];  Service: Cardiovascular;  Laterality: N/A;  . ELBOW SURGERY Left ?2001   "pinched nerve"  . ENDOSCOPIC MUCOSAL RESECTION  12/18/2018   Procedure: ENDOSCOPIC MUCOSAL RESECTION;  Surgeon: Rush Landmark Telford Nab., MD;  Location: [place];  Service: Gastroenterology;;  . Otho Darner SIGMOIDOSCOPY N/A 12/18/2018   Procedure: Beryle Quant;  Surgeon: Irving Copas., MD;  Location: [place];  Service: Gastroenterology;  Laterality: N/A;  . HEMOSTASIS CLIP PLACEMENT  12/18/2018   Procedure: HEMOSTASIS CLIP PLACEMENT;  Surgeon: Irving Copas., MD;  Location: [place];  Service: Gastroenterology;;  . LEFT HEART CATH AND CORONARY ANGIOGRAPHY N/A 01/11/2019   Procedure: LEFT HEART CATH AND CORONARY ANGIOGRAPHY;  Surgeon: Adrian Prows, MD;  Location: [place] [facility];  Service: Cardiovascular;  Laterality: N/A;  . LEFT HEART CATH AND CORS/GRAFTS ANGIOGRAPHY N/A 08/04/2017   Procedure: LEFT HEART CATH AND CORS/GRAFTS ANGIOGRAPHY;  Surgeon: Adrian Prows, MD;  Location: [place] [facility];  Service: Cardiovascular;  Laterality: N/A;  . LEFT HEART CATH AND CORS/GRAFTS ANGIOGRAPHY N/A 08/20/2017   Procedure: LEFT HEART CATH AND CORS/GRAFTS ANGIOGRAPHY;  Surgeon: Nigel Mormon, MD;  Location: [place] [facility];  Service: Cardiovascular;  Laterality: N/A;  . LEFT HEART CATH AND CORS/GRAFTS ANGIOGRAPHY N/A 01/08/2019   Procedure: LEFT HEART CATH AND CORS/GRAFTS ANGIOGRAPHY;  Surgeon: [place] Hampshire, MD;  Location: [place] [facility];  Service: Cardiovascular;  Laterality: N/A;  . POLYPECTOMY  09/19/2018   Procedure: POLYPECTOMY;  Surgeon: Thornton Park, MD;  [facility];  Service: Gastroenterology;;  . Lia Foyer INJECTION  09/19/2018   Procedure: SUBMUCOSAL  INJECTION;  Surgeon: Thornton Park, MD;  [facility];  Service: Gastroenterology;;  . Lia Foyer LIFTING INJECTION  12/18/2018   Procedure: SUBMUCOSAL LIFTING INJECTION;  Surgeon: Irving Copas., MD;  Location: [place];  Service: Gastroenterology;;    Current Outpatient Rx  . Order #: [id number] Class: OTC  . Order #: [id number] Class: No Print  . Order #: [id number] Class: No Print  . Order #: [id number] Class: No Print  . Order #: [id number] Class: Historical Med  . Order #: [id number] Class: Historical Med  . Order #: [id number] Class: No Print  . Order #: [id number] Class: Historical Med  . Order #: [id number] Class: Print  . Order #: [id number] Class: No Print  . Order #: [id number] Class: Historical Med  . Order #: [id number] Class: Historical Med  . Order #: [id number] Class: No Print  . Order #: [id number] Class: Historical Med  . Order #: [id number] Class: Normal  . Order #: [id number] Class: Historical Med  . Order #: [id number] Class: No Print  . Order #: [id number] Class: Historical Med  . Order #: [id number] Class: No Print  . Order #: [id number] Class: No Print    Allergies Diltiazem  Family History  Problem Relation Age of Onset  . Uterine cancer Mother   . Lung cancer Mother   . Hyperlipidemia Father   . Heart disease Father   . Hypertension Father   . Diabetes Father   . Colon cancer Neg Hx   . Esophageal cancer Neg Hx   . Colon polyps Neg Hx   . Rectal cancer Neg Hx   . Stomach cancer Neg Hx   . Inflammatory bowel disease Neg Hx   . Liver disease Neg Hx   . Pancreatic cancer Neg Hx     Social History Social History   Tobacco Use  . Smoking status: Former Smoker    Packs/day: 0.25    Years: 27.00    Pack years: 6.75    Types: Cigars    Quit date: 2002    Years since quitting: 19.7  . Smokeless tobacco: Former Systems developer    Quit date: 2002  Vaping Use  . Vaping Use: Never used  Substance Use Topics  . Alcohol use: Not Currently  . Drug use: Never     Review of Systems  All other systems negative except as documented in the HPI. All pertinent positives and negatives as reviewed in the HPI. ____________________________________________  PHYSICAL EXAM:  VITAL SIGNS: ED Triage Vitals  Enc Vitals Group     BP 10/08/19 2352 (!) 159/96     Pulse Rate 10/08/19 2352 97     Resp 10/08/19 2352 (!) 30     Temp 10/09/19 0006 (!) 96.7 F (35.9 C)     Temp Source 10/09/19 0006 Temporal     SpO2 10/08/19 2352 90 %     Weight 10/08/19 2345 218 lb 14.7 oz (99.3 kg)     Height 10/08/19 2345 5\' 7"  (1.702 m)    Constitutional: Alert and following commands but slow. Well appearing and in respiratory distress Eyes: Conjunctivae are normal. PERRL. EOMI. Head: Atraumatic.  Nose: No congestion/rhinnorhea. Mouth/Throat: Mucous membranes are dry.  Oropharynx non-erythematous. Neck: No stridor.  No meningeal signs.   Cardiovascular: Normal rate, regular rhythm. Good peripheral circulation. Grossly normal heart sounds.   Respiratory: Respiratory distress.  Tachypneic, retractions, accessory muscle usage.  Patient has rales bilateral lung fields worse lower.  Hypoxic to low 60s here when transitioning BiPAP. Gastrointestinal: Soft and nontender. No distention.  Musculoskeletal: No lower extremity tenderness mild 1+ pitting edema to bilateral lower extremities to just proximal the ankle. No gross deformities of extremities. Neurologic: Patient able to follow commands but did not fully assess secondary to situation Skin:  Skin is cool, dry and intact. No rash noted.  ____________________________________________   LABS (all labs ordered are listed, but only abnormal results are displayed)  Labs Reviewed  SARS CORONAVIRUS 2 BY RT PCR (HOSPITAL ORDER, [place] [facility])  CBC  COMPREHENSIVE METABOLIC PANEL  BRAIN NATRIURETIC PEPTIDE  TROPONIN I (HIGH SENSITIVITY)   ____________________________________________  EKG   EKG  Interpretation  Date/Time:  Monday October 08 2019 23:48:06 EDT Ventricular Rate:  98 PR Interval:    QRS Duration: 216 QT Interval:  466 QTC Calculation: 596 R Axis:   -114 Text Interpretation: paced rhytthm widened QRS still with paced rhythm compard to december Confirmed by Merrily Pew [phone]) on 10/09/2019 12:09:46 AM       ____________________________________________  RADIOLOGY  No results found. ____________________________________________  PROCEDURES  Procedure(s) performed:   Procedures ____________________________________________  INITIAL IMPRESSION / ASSESSMENT AND PLAN / ED COURSE   This patient presents to the ED for concern of respiratory distress and weakness, this involves an extensive number of treatment options, and is a complaint that carries with it a high risk of complications and morbidity.  The differential diagnosis includes acute pulmonary edema, aspiration, asthma, pneumonia/pneumonitis secondary to viral or bacterial causes, pulmonary embolus.  Secondary to his high blood pressure and previous presentation with similar findings suspect likely acute pulmonary edema.  Transition to our BiPAP, nitroglycerin started after reviewing the chest x-ray which shows significant interstitial markings consistent with fluid overload.  We will also give IV dose of Lasix.  Will assess for stability on the BiPAP and possibly try to wean him off of 100% and discuss with his cardiologist, Dr. Einar Gip, for further management     Lab Tests:   I Ordered, reviewed, and interpreted labs, which included elevated BNP, slightly elevated troponin  Medicines ordered:   I ordered medication nitroglycerin  For flash pulmonary edema   Imaging Studies ordered:   I independently visualized and interpreted imaging cxr which showed c/w pulmonary edema  Additional history obtained:   Additional history obtained from ems  Previous records obtained and reviewed in  epic  Consultations Obtained:   I consulted cardiology multiple times but did not get a return call, ultimately admitted to medicine  Reevaluation:  After the interventions stated above, I reevaluated the patient and found improving breathing status. Weaned off of bipap to nonrebreather.   Critical Interventions: bipap NTG infusion Rapid titration of medications and O2  ____________________________________________  FINAL CLINICAL IMPRESSION(S) / ED DIAGNOSES  Final diagnoses:  None    MEDICATIONS GIVEN DURING THIS VISIT:  Medications  nitroGLYCERIN 50 mg in dextrose 5 % 250 mL (0.2 mg/mL) infusion (50 mcg/min Intravenous New Bag/Given 10/09/19 0000)  nitroGLYCERIN 0.2 mg/mL in dextrose 5 % infusion (has no administration in time range)  furosemide (LASIX) injection 80 mg (has no administration in time range)  NEW OUTPATIENT MEDICATIONS STARTED DURING THIS VISIT:  New Prescriptions   No medications on file    Note:  This note was prepared with assistance of Dragon voice recognition software. Occasional wrong-word or sound-a-like substitutions may have occurred due to the inherent limitations of voice recognition software.   Velena Keegan, Corene Cornea, MD 10/10/19 [phone]

## 2019-10-09 NOTE — ED Notes (Signed)
Pt transitioned to 10L NRB; sats at 98%

## 2019-10-09 NOTE — Discharge Instructions (Signed)
Drink plenty of fluids for 48 hours and keep wrist elevated at heart level for 24 hours  Radial Site Care   This sheet gives you information about how to care for yourself after your procedure. Your health care provider may also give you more specific instructions. If you have problems or questions, contact your health care provider. What can I expect after the procedure? After the procedure, it is common to have:  Bruising and tenderness at the catheter insertion area. Follow these instructions at home: Medicines  Take over-the-counter and prescription medicines only as told by your health care provider. Insertion site care 1. Follow instructions from your health care provider about how to take care of your insertion site. Make sure you: ? Wash your hands with soap and water before you change your bandage (dressing). If soap and water are not available, use hand sanitizer. ? Remove your dressing as told by your health care provider. In 24 hours 2. Check your insertion site every day for signs of infection. Check for: ? Redness, swelling, or pain. ? Fluid or blood. ? Pus or a bad smell. ? Warmth. 3. Do not take baths, swim, or use a hot tub until your health care provider approves. 4. You may shower 24-48 hours after the procedure, or as directed by your health care provider. ? Remove the dressing and gently wash the site with plain soap and water. ? Pat the area dry with a clean towel. ? Do not rub the site. That could cause bleeding. 5. Do not apply powder or lotion to the site. Activity   1. For 24 hours after the procedure, or as directed by your health care provider: ? Do not flex or bend the affected arm. ? Do not push or pull heavy objects with the affected arm. ? Do not drive yourself home from the hospital or clinic. You may drive 24 hours after the procedure unless your health care provider tells you not to. ? Do not operate machinery or power tools. 2. Do not lift  anything that is heavier than 5 lb, or the limit that you are told, until your health care provider says that it is safe.  For 1 week 3. Ask your health care provider when it is okay to: ? Return to work or school. ? Resume usual physical activities or sports. ? Resume sexual activity. General instructions  If the catheter site starts to bleed, raise your arm and put firm pressure on the site. If the bleeding does not stop, get help right away. This is a medical emergency.  If you went home on the same day as your procedure, a responsible adult should be with you for the first 24 hours after you arrive home.  Keep all follow-up visits as told by your health care provider. This is important. Contact a health care provider if:  You have a fever.  You have redness, swelling, or yellow drainage around your insertion site. Get help right away if:  You have unusual pain at the radial site.  The catheter insertion area swells very fast.  The insertion area is bleeding, and the bleeding does not stop when you hold steady pressure on the area.  Your arm or hand becomes pale, cool, tingly, or numb. These symptoms may represent a serious problem that is an emergency. Do not wait to see if the symptoms will go away. Get medical help right away. Call your local emergency services (911 in the [place] Do not drive   yourself to the hospital. Summary  After the procedure, it is common to have bruising and tenderness at the site.  Follow instructions from your health care provider about how to take care of your radial site wound. Check the wound every day for signs of infection.  Do not lift anything that is heavier than 5 lb, or the limit that you are told, until your health care provider says that it is safe. This information is not intended to replace advice given to you by your health care provider. Make sure you discuss any questions you have with your health care provider. Document Revised:  02/16/2017 Document Reviewed: 02/16/2017 Elsevier Patient Education  2020 [employer]  

## 2019-10-09 NOTE — Telephone Encounter (Signed)
Please do TOC for this patient.

## 2019-10-09 NOTE — H&P (Signed)
History and Physical    Martin Bush QBH:[id number] DOB: 1955/11/20 DOA: 10/08/2019  PCP: Martin Haven, MD  Patient coming from: Home.  Chief Complaint: Shortness of breath.  HPI: Martin Bush is a 64 y.o. male with history of CAD status post CABG and multiple cardiac stenting last one in 2019 with history of ischemic cardiomyopathy last EF measured in December 2020 was 25 to 30% with chronic kidney disease stage III diabetes mellitus type 2 poorly controlled obesity presents to the ER with sudden onset of shortness of breath since last evening while watching TV with his wife.  Patient also had diaphoresis but no chest pain.  Denies any fever chills productive cough.  ED Course: In the ER patient's blood pressure was 160/98 and was edematous on exam with chest x-ray showing pulmonary edema with BNP of 2000 and high-sensitivity troponin of 80 labs are significant for blood glucose of 558 bicarb of 21 WBC of 17 Covid test negative.  EKG shows paced rhythm.  Patient was given Lasix IV started on nitroglycerin infusion following which patient's respiration improved.  Cardiologist on-call Dr. Einar Gip was consulted.  Started on heparin as per the cardiology request.  Review of Systems: As per HPI, rest all negative.   Past Medical History:  Diagnosis Date  . AICD (automatic cardioverter/defibrillator) present    Medtronic  . Arthritis    "thumbs"  (08/02/2017)  . CHF (congestive heart failure) ([place])   . Chronic combined systolic and diastolic heart failure ([place])    a. 08/2017 Echo: EF 20-25%, mod glob HK. Sev distal ant sept, inflat HK. Apical AK. Gr2 DD. Mildly reduced RV fxn.  . Colon polyps   . Coronary artery disease    a. s/p CABG x 2 (LIMA->LAD, VG->OM); b. Multiple PCI's to LM/LCX/OM; c. 07/2017 PTCA of LM/LCX w/ early ISR-->repeat PCI/DES to LM (3.5x20 Synergy DES) and LCX (3.0x20 Synergy DES); d. 07/2018 Relook Cath: LM patent stent, LAD 100ost, LCX patent stent, OM1 99/60,  LIMA->LAD ok. VG->dLCX 2 (old).  . Depression   . Encounter for assessment of implantable cardioverter-defibrillator (ICD) 09/27/2018  . High cholesterol   . Hypertension   . ICD; Biventricular  Medtronic ICD Amplia MRI QWuad CRT-D  in situ 10/29/14 10/29/2014   Remote ICD check 09.23.20:  One 6 beat NSVT. No therapy.  1 SVT episode @ 130 bpm (38 Sec).  There were 23 Vent sense episodes detected for up to 1.1 min/day (AT). Health trends (patient activity, heart rate variability, average heart rates) are stable.Trans-thoracic impedance trends and the OptiVol Fluid Index do no present significant abnormalities. Battery longevity is 4.3 years. RA pacing is 3  . Ischemic cardiomyopathy 09/27/2018  . MI (myocardial infarction) ([place]) 2003  . NSTEMI (non-ST elevated myocardial infarction) ([place]) 07/16/2014  . OSA on CPAP   . Oxygen deficiency   . Pneumonia 10/2014  . Proteinuria   . Sleep apnea   . Type II diabetes mellitus (HCC)    insulin dependent    Past Surgical History:  Procedure Laterality Date  . BIOPSY  09/19/2018   Procedure: BIOPSY;  Surgeon: Thornton Park, MD;  Location: [facility];  Service: Gastroenterology;;  . CARDIAC CATHETERIZATION N/A 07/16/2014   Procedure: Left Heart Cath and Coronary Angiography;  Surgeon: Adrian Prows, MD;  Location: [place] [facility];  Service: Cardiovascular;  Laterality: N/A;  . CARDIAC CATHETERIZATION  07/16/2014   Procedure: Coronary Balloon Angioplasty;  Surgeon: Adrian Prows, MD;  Location: [place] [facility];  Service:  Cardiovascular;;  . CARDIAC CATHETERIZATION  2003  . CARDIAC DEFIBRILLATOR PLACEMENT  2016  . CATARACT EXTRACTION W/ INTRAOCULAR LENS IMPLANT Left 03/2014  . COLONOSCOPY    . COLONOSCOPY WITH PROPOFOL N/A 09/19/2018   Procedure: COLONOSCOPY WITH PROPOFOL;  Surgeon: Thornton Park, MD;  Location: [facility];  Service: Gastroenterology;  Laterality: N/A;  . CORONARY ANGIOPLASTY WITH STENT PLACEMENT     "I've got a total of 8 stents  in there; mostly [facility]ine [facility]" (08/02/2017)  . CORONARY ARTERY BYPASS GRAFT  ~ 2003   "CABG X2"; [facility]  . CORONARY ATHERECTOMY N/A 08/16/2017   Procedure: CORONARY ATHERECTOMY;  Surgeon: Nigel Mormon, MD;  Location: [place] [facility];  Service: Cardiovascular;  Laterality: N/A;  . CORONARY BALLOON ANGIOPLASTY N/A 08/04/2017   Procedure: CORONARY BALLOON ANGIOPLASTY;  Surgeon: Adrian Prows, MD;  Location: [place] [facility];  Service: Cardiovascular;  Laterality: N/A;  . CORONARY BALLOON ANGIOPLASTY N/A 01/11/2019   Procedure: CORONARY BALLOON ANGIOPLASTY;  Surgeon: Adrian Prows, MD;  Location: [place] [facility];  Service: Cardiovascular;  Laterality: N/A;  . CORONARY STENT INTERVENTION N/A 08/16/2017   Procedure: CORONARY STENT INTERVENTION;  Surgeon: Nigel Mormon, MD;  Location: [place] [facility];  Service: Cardiovascular;  Laterality: N/A;  . ELBOW SURGERY Left ?2001   "pinched nerve"  . ENDOSCOPIC MUCOSAL RESECTION  12/18/2018   Procedure: ENDOSCOPIC MUCOSAL RESECTION;  Surgeon: Rush Landmark Telford Nab., MD;  Location: [place];  Service: Gastroenterology;;  . Otho Darner SIGMOIDOSCOPY N/A 12/18/2018   Procedure: Beryle Quant;  Surgeon: Irving Copas., MD;  Location: [place];  Service: Gastroenterology;  Laterality: N/A;  . HEMOSTASIS CLIP PLACEMENT  12/18/2018   Procedure: HEMOSTASIS CLIP PLACEMENT;  Surgeon: Irving Copas., MD;  Location: [place];  Service: Gastroenterology;;  . LEFT HEART CATH AND CORONARY ANGIOGRAPHY N/A 01/11/2019   Procedure: LEFT HEART CATH AND CORONARY ANGIOGRAPHY;  Surgeon: Adrian Prows, MD;  Location: [place] [facility];  Service: Cardiovascular;  Laterality: N/A;  . LEFT HEART CATH AND CORS/GRAFTS ANGIOGRAPHY N/A 08/04/2017   Procedure: LEFT HEART CATH AND CORS/GRAFTS ANGIOGRAPHY;  Surgeon: Adrian Prows, MD;  Location: [place] [facility];  Service: Cardiovascular;  Laterality: N/A;  . LEFT HEART CATH  AND CORS/GRAFTS ANGIOGRAPHY N/A 08/20/2017   Procedure: LEFT HEART CATH AND CORS/GRAFTS ANGIOGRAPHY;  Surgeon: Nigel Mormon, MD;  Location: [place] [facility];  Service: Cardiovascular;  Laterality: N/A;  . LEFT HEART CATH AND CORS/GRAFTS ANGIOGRAPHY N/A 01/08/2019   Procedure: LEFT HEART CATH AND CORS/GRAFTS ANGIOGRAPHY;  Surgeon: Wellington Hampshire, MD;  Location: [place] [facility];  Service: Cardiovascular;  Laterality: N/A;  . POLYPECTOMY  09/19/2018   Procedure: POLYPECTOMY;  Surgeon: Thornton Park, MD;  [facility];  Service: Gastroenterology;;  . Lia Foyer INJECTION  09/19/2018   Procedure: SUBMUCOSAL INJECTION;  Surgeon: Thornton Park, MD;  [facility];  Service: Gastroenterology;;  . Lia Foyer LIFTING INJECTION  12/18/2018   Procedure: SUBMUCOSAL LIFTING INJECTION;  Surgeon: Irving Copas., MD;  Location: [place];  Service: Gastroenterology;;     reports that he quit smoking about 19 years ago. His smoking use included cigars. He has a 6.75 pack-year smoking history. He quit smokeless tobacco use about 19 years ago. He reports previous alcohol use. He reports that he does not use drugs.  Allergies  Allergen Reactions  . Diltiazem Rash    Family History  Problem Relation Age of Onset  . Uterine cancer Mother   .  Lung cancer Mother   . Hyperlipidemia Father   . Heart disease Father   . Hypertension Father   . Diabetes Father   . Colon cancer Neg Hx   . Esophageal cancer Neg Hx   . Colon polyps Neg Hx   . Rectal cancer Neg Hx   . Stomach cancer Neg Hx   . Inflammatory bowel disease Neg Hx   . Liver disease Neg Hx   . Pancreatic cancer Neg Hx     Prior to Admission medications   Medication Sig Start Date End Date Taking? Authorizing Provider  acetaminophen (TYLENOL) 325 MG tablet Take 2 tablets (650 mg total) by mouth every 4 (four) hours as needed for headache or mild pain. Patient not taking: Reported on 08/24/2019  01/08/19   Nolberto Hanlon, MD  amiodarone (PACERONE) 200 MG tablet Take 0.5 tablets (100 mg total) by mouth daily. 05/28/19   Adrian Prows, MD  aspirin EC 81 MG EC tablet Take 1 tablet (81 mg total) by mouth daily. 01/08/19   Nolberto Hanlon, MD  carvedilol (COREG) 6.25 MG tablet Take 1 tablet (6.25 mg total) by mouth 2 (two) times daily with a meal. 01/08/19   Nolberto Hanlon, MD  Cholecalciferol (VITAMIN D3) 50 MCG (2000 UT) TABS Take by mouth 2 (two) times daily.    [provider]  empagliflozin (JARDIANCE) 25 MG TABS tablet Take 25 mg by mouth daily.    Martin Haven, MD  ezetimibe (ZETIA) 10 MG tablet Take 1 tablet (10 mg total) by mouth at bedtime. 01/08/19   Nolberto Hanlon, MD  furosemide (LASIX) 40 MG tablet Take 40 mg by mouth daily. Currently taking ~1/week    [provider]  insulin aspart protamine- aspart (NOVOLOG MIX 70/30) (70-30) 100 UNIT/ML injection Inject 0.25 mLs (25 Units total) into the skin 2 (two) times daily with a meal. Patient taking differently: Inject 63 Units into the skin 2 (two) times daily with a meal.  01/08/19   Nolberto Hanlon, MD  isosorbide mononitrate (IMDUR) 30 MG 24 hr tablet Take 1 tablet (30 mg total) by mouth daily. 01/08/19   Nolberto Hanlon, MD  liraglutide (VICTOZA) 18 MG/3ML SOPN Inject 1.8 mg into the skin daily.    [provider]  metFORMIN (GLUCOPHAGE) 500 MG tablet Take 1,000 mg by mouth 2 (two) times daily with a meal. Taking 2 tabs twice a day    Martin Haven, MD  nitroGLYCERIN (NITROSTAT) 0.4 MG SL tablet Place 1 tablet (0.4 mg total) under the tongue every 5 (five) minutes x 3 doses as needed for chest pain. Patient not taking: Reported on 08/24/2019 01/08/19   Nolberto Hanlon, MD  Omega-3 Fatty Acids (FISH OIL) 1000 MG CAPS Take 1,000 mg by mouth 2 (two) times daily.     [provider]  potassium chloride SA (KLOR-CON) 20 MEQ tablet TAKE 1 TABLET BY MOUTH TWICE A DAY 05/10/19   Adrian Prows, MD  PRESCRIPTION MEDICATION  Inhale into the lungs at bedtime. CPAP    [provider]  rosuvastatin (CRESTOR) 40 MG tablet Take 1 tablet (40 mg total) by mouth at bedtime. 01/08/19   Nolberto Hanlon, MD  sacubitril-valsartan (ENTRESTO) 97-103 MG Take 1 tablet by mouth 2 (two) times daily.    Martin Haven, MD  sertraline (ZOLOFT) 50 MG tablet Take 1 tablet (50 mg total) by mouth at bedtime. 01/08/19   Nolberto Hanlon, MD  ticagrelor (BRILINTA) 90 MG TABS tablet Take 1 tablet (90 mg total)  by mouth 2 (two) times daily. 01/08/19   Nolberto Hanlon, MD    Physical Exam: Constitutional: Moderately built and nourished. Vitals:   10/09/19 0200 10/09/19 0245 10/09/19 0430 10/09/19 0545  BP: (!) 92/56 (!) 100/58 111/72 123/80  Pulse: 77 75 75 72  Resp: 20 19 16 17   Temp:      TempSrc:      SpO2: 95% 99% 96% 99%  Weight:      Height:       Eyes: Anicteric no pallor. ENMT: No discharge from the ears eyes nose or mouth. Neck: JVD elevated no mass felt. Respiratory: No rhonchi or crepitations. Cardiovascular: S1-S2 heard. Abdomen: Soft nontender bowel sounds present. Musculoskeletal: Bilateral lower extremity edema present. Skin: Chronic skin changes. Neurologic: Alert awake oriented to time place and person.  Moves all extremities. Psychiatric: Appears normal.  Normal affect.   Labs on Admission: I have personally reviewed following labs and imaging studies  CBC: Recent Labs  Lab 10/09/19 0005  WBC 17.0*  HGB 13.9  HCT 44.1  MCV 95.5  PLT 702   Basic Metabolic Panel: Recent Labs  Lab 10/09/19 0005  NA 133*  K 4.5  CL 98  CO2 21*  GLUCOSE 558*  BUN 23  CREATININE 1.59*  CALCIUM 8.7*   GFR: Estimated Creatinine Clearance: 53.4 mL/min (A) (by C-G formula based on SCr of 1.59 mg/dL (H)). Liver Function Tests: Recent Labs  Lab 10/09/19 0005  AST 23  ALT 18  ALKPHOS 155*  BILITOT 1.0  PROT 7.4  ALBUMIN 3.7   No results for input(s): LIPASE, AMYLASE in the last 168 hours. No results for  input(s): AMMONIA in the last 168 hours. Coagulation Profile: No results for input(s): INR, PROTIME in the last 168 hours. Cardiac Enzymes: No results for input(s): CKTOTAL, CKMB, CKMBINDEX, TROPONINI in the last 168 hours. BNP (last 3 results) No results for input(s): PROBNP in the last 8760 hours. HbA1C: No results for input(s): HGBA1C in the last 72 hours. CBG: No results for input(s): GLUCAP in the last 168 hours. Lipid Profile: No results for input(s): CHOL, HDL, LDLCALC, TRIG, CHOLHDL, LDLDIRECT in the last 72 hours. Thyroid Function Tests: No results for input(s): TSH, T4TOTAL, FREET4, T3FREE, THYROIDAB in the last 72 hours. Anemia Panel: No results for input(s): VITAMINB12, FOLATE, FERRITIN, TIBC, IRON, RETICCTPCT in the last 72 hours. Urine analysis:    Component Value Date/Time   COLORURINE YELLOW 08/02/2017 1444   APPEARANCEUR CLEAR 08/02/2017 1444   LABSPEC 1.012 08/02/2017 1444   PHURINE 5.0 08/02/2017 1444   GLUCOSEU >=500 (A) 08/02/2017 1444   HGBUR NEGATIVE 08/02/2017 1444   BILIRUBINUR NEGATIVE 08/02/2017 1444   KETONESUR NEGATIVE 08/02/2017 1444   PROTEINUR NEGATIVE 08/02/2017 1444   NITRITE NEGATIVE 08/02/2017 1444   LEUKOCYTESUR NEGATIVE 08/02/2017 1444   Sepsis Labs: @LABRCNTIP (procalcitonin:4,lacticidven:4) ) Recent Results (from the past 240 hour(s))  SARS Coronavirus 2 by RT PCR (hospital order, performed in [place] hospital lab) Nasopharyngeal Nasopharyngeal Swab     Status: None   Collection Time: 10/09/19 12:05 AM   Specimen: Nasopharyngeal Swab  Result Value Ref Range Status   SARS Coronavirus 2 NEGATIVE NEGATIVE Final    Comment: (NOTE) SARS-CoV-2 target nucleic acids are NOT DETECTED.  The SARS-CoV-2 RNA is generally detectable in upper and lower respiratory specimens during the acute phase of infection. The lowest concentration of SARS-CoV-2 viral copies this assay can detect is 250 copies / mL. A negative result does not preclude  SARS-CoV-2 infection and  should not be used as the sole basis for treatment or other patient management decisions.  A negative result may occur with improper specimen collection / handling, submission of specimen other than nasopharyngeal swab, presence of viral mutation(s) within the areas targeted by this assay, and inadequate number of viral copies (<250 copies / mL). A negative result must be combined with clinical observations, patient history, and epidemiological information.  Fact Sheet for Patients:   StrictlyIdeas.no  Fact Sheet for Healthcare Providers: BankingDealers.co.za  This test is not yet approved or  cleared by the [place] FDA and has been authorized for detection and/or diagnosis of SARS-CoV-2 by FDA under an Emergency Use Authorization (EUA).  This EUA will remain in effect (meaning this test can be used) for the duration of the COVID-19 declaration under Section 564(b)(1) of the Act, 21 U.S.C. section 360bbb-3(b)(1), unless the authorization is terminated or revoked sooner.  Performed at [facility], [place] [address] [place], [place] [address]      Radiological Exams on Admission: DG Chest Portable 1 View  Result Date: 10/09/2019 CLINICAL DATA:  Shortness of breath EXAM: PORTABLE CHEST 1 VIEW COMPARISON:  01/03/2019 FINDINGS: Unchanged cardiomegaly. Worsened bilateral interstitial opacities. Remote median sternotomy with unchanged position of left chest wall 3 lead AICD. IMPRESSION: Cardiomegaly and worsened bilateral interstitial opacities likely indicating pulmonary edema. Electronically Signed   By: Ulyses Jarred M.D.   On: 10/09/2019 00:19    EKG: Independently reviewed.  Paced rhythm.  Assessment/Plan Principal Problem:   Acute pulmonary edema (HCC) Active Problems:   Insulin dependent type 2 diabetes mellitus (HCC)   CKD (chronic kidney disease) stage 3, GFR 30-59 ml/min   ICD; Biventricular   Medtronic ICD Amplia MRI QWuad CRT-D  in situ 10/29/14   Ischemic cardiomyopathy   Acute on chronic combined systolic and diastolic CHF (congestive heart failure) ([place])    1. Acute pulmonary edema with history of ischemic cardiomyopathy last EF measured was 25 to 30% in December 2020.  Patient received Lasix and I placed patient on Lasix 60 mg IV every 12 hourly.  Discussed with patient's cardiologist Dr. Einar Gip who advised patient to be started on heparin for possible ACS given the history of multiple cardiac stents and CABG.  Patient be kept n.p.o. except medications follow intake output Daily weights metabolic panel continue present medications including carvedilol amiodarone and Entresto and patient is on nitroglycerin infusion. 2. History of CAD status post CABG and stenting aspirin Brilinta statins heparin infusion at this time.  Cardiology to see patient likely cardiac cath.  Patient kept n.p.o. except medications. 3. Diabetes mellitus type 2 uncontrolled -we will check hemoglobin A1c.  Repeat metabolic panel stat to make sure patient is not going into DKA.  Will convert NovoLog 70/30 to Lantus at this time.  Discussed with pharmacy and will place patient on 40 units at this time with sliding scale coverage every 4 change to [facility] at bedtime when patient can eat.  Your metabolic panel shows any worsening anion gap will need insulin drip. 4. Chronic kidney disease stage III creatinine appears to be at baseline. 5. Hypertension we will continue home medications.  Since patient has acute pulmonary edema with likely ACS will need close monitoring for any further worsening in inpatient status.   DVT prophylaxis: Heparin. Code Status: Full code. Family Communication: Discussed with patient. Disposition Plan: Home. Consults called: Cardiology. Admission status: Inpatient.   Rise Patience MD [facility] Pager [phone].  If 7PM-7AM, please  contact  night-coverage [email or web address] Password [facility]  10/09/2019, 5:59 AM

## 2019-10-09 NOTE — Progress Notes (Signed)
1310-1400 Dicussed with pt the importance of brilinta with stent. Gave MI booklet and discussed restrictions, NTG use, walking for ex, heart healthy and low sodium diets, low carb foods, and CRP 2. Pt just completed CRP 2 recently at [facility]. Will refer back. Discussed with pt the importance of getting diabetes more controlled, and watching for signs/symptoms of CHF. Discussed daily weights and when to call MD and 2000 mg sodium restriction. Pt voiced understanding of ed. Graylon Good RN BSN 10/09/2019 2:01 PM

## 2019-10-10 ENCOUNTER — Telehealth: Payer: Self-pay | Admitting: Gastroenterology

## 2019-10-10 ENCOUNTER — Other Ambulatory Visit: Payer: Self-pay

## 2019-10-10 ENCOUNTER — Telehealth: Payer: Self-pay | Admitting: Student

## 2019-10-10 ENCOUNTER — Ambulatory Visit (INDEPENDENT_AMBULATORY_CARE_PROVIDER_SITE_OTHER): Payer: No Typology Code available for payment source | Admitting: Family Medicine

## 2019-10-10 ENCOUNTER — Telehealth: Payer: Self-pay

## 2019-10-10 ENCOUNTER — Encounter: Payer: Self-pay | Admitting: Family Medicine

## 2019-10-10 VITALS — BP 130/78 | HR 85 | Temp 98.2°F | Ht 67.0 in | Wt 223.0 lb

## 2019-10-10 DIAGNOSIS — I214 Non-ST elevation (NSTEMI) myocardial infarction: Secondary | ICD-10-CM | POA: Diagnosis not present

## 2019-10-10 DIAGNOSIS — I25110 Atherosclerotic heart disease of native coronary artery with unstable angina pectoris: Secondary | ICD-10-CM

## 2019-10-10 DIAGNOSIS — E119 Type 2 diabetes mellitus without complications: Secondary | ICD-10-CM

## 2019-10-10 DIAGNOSIS — Z794 Long term (current) use of insulin: Secondary | ICD-10-CM | POA: Diagnosis not present

## 2019-10-10 DIAGNOSIS — R238 Other skin changes: Secondary | ICD-10-CM

## 2019-10-10 MED ORDER — INSULIN ASPART PROT & ASPART (70-30 MIX) 100 UNIT/ML ~~LOC~~ SUSP
SUBCUTANEOUS | 11 refills | Status: DC
Start: 2019-10-10 — End: 2020-07-08

## 2019-10-10 MED FILL — Nitroglycerin IV Soln 100 MCG/ML in D5W: INTRA_ARTERIAL | Qty: 10 | Status: AC

## 2019-10-10 NOTE — Telephone Encounter (Signed)
Location of hospitalization: [facility] Reason for hospitalization: Patient could not breathe, he felt hot, temperature kept going up, sweating and sitting on the floor and couldn't move.  Date of discharge: 10/10/2019 Date of first communication with patient: today Person contacting patient: Me Current symptoms: Feeling a lot better Do you understand why you were in the Hospital: Yes Questions regarding discharge instructions: None Where were you discharged to: Home Medications reviewed: Yes Allergies reviewed: Yes Dietary changes reviewed: Yes. Discussed low fat and low salt diet.  Referals reviewed: NA Activities of Daily Living: Able to with mild limitations Any transportation issues/concerns: None Any patient concerns: Patient is just feeling cold all the time now, is that normal?  Confirmed importance & date/time of Follow up appt: Yes Confirmed with patient if condition begins to worsen call. Pt was given the office number and encouraged to call back with questions or concerns: Yes

## 2019-10-10 NOTE — Assessment & Plan Note (Signed)
Patient has done well status post catheterization and angioplasty.  He will keep his appointment with cardiology.

## 2019-10-10 NOTE — Telephone Encounter (Signed)
Patients wife calling to rescheudle procedure

## 2019-10-10 NOTE — Assessment & Plan Note (Signed)
Uncontrolled.  We will increase his insulin 70/30 to 66 units in the morning with breakfast and 60 units at night with his evening meal.  He will continue his Victoza and Metformin.  He will see the endocrinologist at the [facility] as planned in the next month or so.

## 2019-10-10 NOTE — Patient Instructions (Signed)
Nice to see you. We will increase your insulin 70/30 mix to 66 units with breakfast and 60 units with your evening meal.  Please see the endocrinologist as planned. Please contact the [facility] regarding the irritation on the back of your neck from your CPAP mask.

## 2019-10-10 NOTE — Progress Notes (Signed)
Martin Rumps, MD Phone: [phone]  Martin Bush is a 64 y.o. male who presents today for follow-up.  Diabetes: Typically in the 200s.  Taking Jardiance, 70/30 insulin 60 units twice daily, Victoza, and Metformin.  No polyuria or polydipsia.  No hypoglycemia.  He scheduled to see ophthalmology later this year.  MI: Notes he had a heart attack earlier this week.  He presented with dyspnea.  He underwent cardiac catheterization and was found to have high-grade restenosis of the left main and also circumflex coronary artery.  He also had new stenosis in a large dominant mid circumflex.  He underwent successful angioplasty.  He has follow-up with cardiology next week.  He continues on aspirin, Zetia, Lasix, Imdur, Crestor, Entresto, and Brilinta.  He has done well and has not had any recurrence of symptoms.  Skin irritation: Patient notes there is an area on the back of his neck where his CPAP strap rubs and causes irritation.  Social History   Tobacco Use  Smoking Status Former Smoker  . Packs/day: 0.25  . Years: 27.00  . Pack years: 6.75  . Types: Cigars  . Quit date: 2002  . Years since quitting: 19.7  Smokeless Tobacco Former Systems developer  . Quit date: 2002     ROS see history of present illness  Objective  Physical Exam Vitals:   10/10/19 1517  BP: 130/78  Pulse: 85  Temp: 98.2 F (36.8 C)  SpO2: 95%    BP Readings from Last 3 Encounters:  10/10/19 130/78  10/09/19 (!) 141/81  08/24/19 (!) 101/56   Wt Readings from Last 3 Encounters:  10/10/19 223 lb (101.2 kg)  10/08/19 218 lb 14.7 oz (99.3 kg)  08/24/19 (!) 219 lb (99.3 kg)    Physical Exam Constitutional:      General: He is not in acute distress.    Appearance: He is not diaphoretic.  Neck:   Cardiovascular:     Rate and Rhythm: Normal rate and regular rhythm.     Heart sounds: Normal heart sounds.  Pulmonary:     Effort: Pulmonary effort is normal.     Breath sounds: Normal breath sounds.    Musculoskeletal:     Right lower leg: No edema.     Left lower leg: No edema.  Skin:    General: Skin is warm and dry.  Neurological:     Mental Status: He is alert.      Assessment/Plan: Please see individual problem list.  Non-ST elevation (NSTEMI) myocardial infarction [facility]) Recently underwent cardiac catheterization and angioplasty for NSTEMI and shortness of breath.  He has had no recurrence of symptoms since discharge.  He will keep his appointment with cardiology next week.  He will continue aspirin, Zetia, Lasix, Imdur, Crestor, Entresto, and Brilinta.  Insulin dependent type 2 diabetes mellitus (HCC) Uncontrolled.  We will increase his insulin 70/30 to 66 units in the morning with breakfast and 60 units at night with his evening meal.  He will continue his Victoza and Metformin.  He will see the endocrinologist at the [facility] as planned in the next month or so.  Skin irritation Likely related to his CPAP strap.  I encouraged him to contact the [facility] to see if they can provide him with a different strap or face mask.  Coronary artery disease involving native coronary artery of native heart with unstable angina pectoris [facility]) Patient has done well status post catheterization and angioplasty.  He will keep his appointment with cardiology.   Orders  Placed This Encounter  Procedures  . Urine Microalbumin w/creat. ratio    Meds ordered this encounter  Medications  . insulin aspart protamine- aspart (NOVOLOG MIX 70/30) (70-30) 100 UNIT/ML injection    Sig: Inject 0.66 mLs (66 Units total) into the skin daily with breakfast AND 0.6 mLs (60 Units total) daily with supper.    Dispense:  10 mL    Refill:  11    Martise was seen today for follow-up.  Diagnoses and all orders for this visit:  Insulin dependent type 2 diabetes mellitus ([place]) -     Urine Microalbumin w/creat. ratio  Non-ST elevation (NSTEMI) myocardial infarction [facility])  Skin irritation  Coronary artery disease  involving native coronary artery of native heart with unstable angina pectoris ([place])  Other orders -     insulin aspart protamine- aspart (NOVOLOG MIX 70/30) (70-30) 100 UNIT/ML injection; Inject 0.66 mLs (66 Units total) into the skin daily with breakfast AND 0.6 mLs (60 Units total) daily with supper.     This visit occurred during the SARS-CoV-2 public health emergency.  Safety protocols were in place, including screening questions prior to the visit, additional usage of staff PPE, and extensive cleaning of exam room while observing appropriate contact time as indicated for disinfecting solutions.    Martin Rumps, MD [place]

## 2019-10-10 NOTE — Assessment & Plan Note (Signed)
Likely related to his CPAP strap.  I encouraged him to contact the [facility] to see if they can provide him with a different strap or face mask.

## 2019-10-10 NOTE — Telephone Encounter (Signed)
Error

## 2019-10-10 NOTE — Assessment & Plan Note (Signed)
Recently underwent cardiac catheterization and angioplasty for NSTEMI and shortness of breath.  He has had no recurrence of symptoms since discharge.  He will keep his appointment with cardiology next week.  He will continue aspirin, Zetia, Lasix, Imdur, Crestor, Entresto, and Brilinta.

## 2019-10-11 ENCOUNTER — Other Ambulatory Visit: Payer: Self-pay

## 2019-10-11 DIAGNOSIS — D128 Benign neoplasm of rectum: Secondary | ICD-10-CM

## 2019-10-11 LAB — MICROALBUMIN / CREATININE URINE RATIO
Creatinine,U: 16.6 mg/dL
Microalb Creat Ratio: 33.5 mg/g — ABNORMAL HIGH (ref 0.0–30.0)
Microalb, Ur: 5.6 mg/dL — ABNORMAL HIGH (ref 0.0–1.9)

## 2019-10-11 NOTE — Telephone Encounter (Signed)
Flex scheduled, pt instructed and medications reviewed. Patient instructions sent to My Chart.  Patient to call with any questions or concerns. Covid information also advised. The pt has been advised of the information and verbalized understanding.

## 2019-10-11 NOTE — Telephone Encounter (Signed)
Appt made for 10/22/19 at [facility] with Dr Rush Landmark at 3 pm.  COVID on 9/23 at 940 am.  Left message on machine to call back

## 2019-10-13 ENCOUNTER — Other Ambulatory Visit (HOSPITAL_COMMUNITY): Payer: No Typology Code available for payment source

## 2019-10-15 ENCOUNTER — Telehealth: Payer: Self-pay

## 2019-10-15 NOTE — Telephone Encounter (Signed)
I see that patient is seeing Cardiology or has an appointment for Cardiology on 9/22. If there is any concern in regards to his stability or ability to come off anti-PLT therapy, then we will need to postpone things. Please send request to Cardiology office, however, also state if they have concerns or think that patient will need further risk stratification then we will be happy to wait if necessary. Thanks. GM

## 2019-10-15 NOTE — Telephone Encounter (Signed)
Patient recently had NSTEMI and stenting. Will need to continue dual antiplatelet therapy. Patient will likely not be ready for at least 6 months for procedure, please postpone. Thank you!

## 2019-10-15 NOTE — Telephone Encounter (Signed)
Please review and make recommendations regarding 9/27 procedure.  Thank you

## 2019-10-15 NOTE — Telephone Encounter (Signed)
The pt has a flex at [facility] on 9/27 and anesthesia called and are requiring cardiac clearance for recent angioplasty for non stemi MI on 9/14.  Please advise

## 2019-10-15 NOTE — Progress Notes (Signed)
Jessica Zanetto P.A. reviewed chart, due to recent PTCA 9/14, anesthesia requests cardiac clearance prior to procedure on 9/27.  Spoke with Patty at Dr. Donneta Romberg office to make them aware.

## 2019-10-16 NOTE — Progress Notes (Signed)
Primary Physician/Referring:  [name], MD  Patient ID: Martin Bush, male    DOB: 03/18/1955, 64 y.o.   MRN: [id number]  Chief Complaint  Patient presents with  . Hospitalization Follow-up  . Shortness of Breath  . Coronary Artery Disease   HPI:    Martin Bush  is a 64 y.o. Caucasian male with CAD and history of CABG x2 in 2002  with only LIMA to LAD being patent, Circumflex coronary artery is super dominant. History of stenting to circumflex/OM bifurcation due to recurrent restenosis in 2015, left main and circumflex proximal and midsegment in 2016, angioplasty to left main and proximal and mid circumflex in 2020 and again 10/08/2019.  He also has a history of VT with ICD, ischemic cardiomyopathy, chronic systolic and diastolic heart failure  Patient recently presented to the hospital and was admitted on 10/08/2019 with acute pulmonary edema and non-ST elevation MI, he underwent urgent cardiac catheterization with angioplasty to left main and circumflex coronary arteries.  He presents today for post hospital follow-up.  Denies chest pain, shortness of breath, PND, orthopnea.  Does report mild bilateral lower leg edema, which he reports is baseline for him.  Since hospital discharge patient has been doing well.  Of note he is taking Entresto 24/26 mg twice daily and Coreg 6.25 mg once daily, despite discharge recommendations instructed otherwise.  He continues to take torsemide 20 mg daily as needed for edema.  Reports he has taken this once in the last week.  He is tolerating dual antiplatelet therapy without bleeding diathesis.  He states he has been slowly increasing his daily activity, however at baseline he is not very active on a daily basis.  Discharge summary 10/09/2019 via Dr. Einar Gip: Hospital Course: Martin Bush is a 64 y.o. male  patient patient admitted with critical illness with acute pulmonary edema, non-ST elevation myocardial infarction and underwent urgent cardiac  catheterization and found to have high-grade restenosis in the left main and also circumflex coronary artery.  He also had a new stenosis in a large dominant mid circumflex.  He underwent successful angioplasty with establishment of TIMI-3 flow from TIMI II flow.  I saw the patient around 3:30 PM, he was doing well without recurrence of dyspnea, breathing has improved, feels stable.  In view of faster recovery than expected, in view of COVID-19 senses and difficulty with beds, patient having ICD implanted, I felt he is stable for discharge with home medications and discussed with him regarding diet and salt restriction and weight loss.  I also called his wife and spoke to her.  There is no left wrist hematoma.  Recommendations on discharge: To manage weight, obesity, DM better. DAPT for a year. OP f/u < 1 week for TOC visit.   Past Medical History:  Diagnosis Date  . AICD (automatic cardioverter/defibrillator) present    Medtronic  . Arthritis    "thumbs"  (08/02/2017)  . CHF (congestive heart failure) ([place])   . Chronic combined systolic and diastolic heart failure ([place])    a. 08/2017 Echo: EF 20-25%, mod glob HK. Sev distal ant sept, inflat HK. Apical AK. Gr2 DD. Mildly reduced RV fxn.  . Colon polyps   . Coronary artery disease    a. s/p CABG x 2 (LIMA->LAD, VG->OM); b. Multiple PCI's to LM/LCX/OM; c. 07/2017 PTCA of LM/LCX w/ early ISR-->repeat PCI/DES to LM (3.5x20 Synergy DES) and LCX (3.0x20 Synergy DES); d. 07/2018 Relook Cath: LM patent stent, LAD 100ost, LCX patent stent, OM1  99/60, LIMA->LAD ok. VG->dLCX 88 (old).  . Depression   . Encounter for assessment of implantable cardioverter-defibrillator (ICD) 09/27/2018  . High cholesterol   . Hypertension   . ICD; Biventricular  Medtronic ICD Amplia MRI QWuad CRT-D  in situ 10/29/14 10/29/2014   Remote ICD check 09.23.20:  One 6 beat NSVT. No therapy.  1 SVT episode @ 130 bpm (38 Sec).  There were 23 Vent sense episodes detected for up to 1.1  min/day (AT). Health trends (patient activity, heart rate variability, average heart rates) are stable.Trans-thoracic impedance trends and the OptiVol Fluid Index do no present significant abnormalities. Battery longevity is 4.3 years. RA pacing is 3  . Ischemic cardiomyopathy 09/27/2018  . MI (myocardial infarction) ([place]) 2003  . NSTEMI (non-ST elevated myocardial infarction) ([place]) 07/16/2014  . OSA on CPAP   . Oxygen deficiency   . Pneumonia 10/2014  . Proteinuria   . Sleep apnea   . Type II diabetes mellitus (HCC)    insulin dependent   Past Surgical History:  Procedure Laterality Date  . BIOPSY  09/19/2018   Procedure: BIOPSY;  Surgeon: Thornton Park, MD;  Location: [facility];  Service: Gastroenterology;;  . CARDIAC CATHETERIZATION N/A 07/16/2014   Procedure: Left Heart Cath and Coronary Angiography;  Surgeon: Adrian Prows, MD;  Location: [place] [facility];  Service: Cardiovascular;  Laterality: N/A;  . CARDIAC CATHETERIZATION  07/16/2014   Procedure: Coronary Balloon Angioplasty;  Surgeon: Adrian Prows, MD;  Location: [place] [facility];  Service: Cardiovascular;;  . CARDIAC CATHETERIZATION  2003  . CARDIAC DEFIBRILLATOR PLACEMENT  2016  . CATARACT EXTRACTION W/ INTRAOCULAR LENS IMPLANT Left 03/2014  . COLONOSCOPY    . COLONOSCOPY WITH PROPOFOL N/A 09/19/2018   Procedure: COLONOSCOPY WITH PROPOFOL;  Surgeon: Thornton Park, MD;  [facility];  Service: Gastroenterology;  Laterality: N/A;  . CORONARY ANGIOPLASTY WITH STENT PLACEMENT     "I've got a total of 8 stents in there; mostly doine at [facility]" (08/02/2017)  . CORONARY ARTERY BYPASS GRAFT  ~ 2003   "CABG X2"; [facility]  . CORONARY ATHERECTOMY N/A 08/16/2017   Procedure: CORONARY ATHERECTOMY;  Surgeon: Nigel Mormon, MD;  Location: [place] [facility];  Service: Cardiovascular;  Laterality: N/A;  . CORONARY BALLOON ANGIOPLASTY N/A 08/04/2017   Procedure: CORONARY BALLOON ANGIOPLASTY;  Surgeon: Adrian Prows,  MD;  Location: [place] [facility];  Service: Cardiovascular;  Laterality: N/A;  . CORONARY BALLOON ANGIOPLASTY N/A 01/11/2019   Procedure: CORONARY BALLOON ANGIOPLASTY;  Surgeon: Adrian Prows, MD;  Location: [place] [facility];  Service: Cardiovascular;  Laterality: N/A;  . CORONARY BALLOON ANGIOPLASTY N/A 10/09/2019   Procedure: CORONARY BALLOON ANGIOPLASTY;  Surgeon: Adrian Prows, MD;  Location: [place] [facility];  Service: Cardiovascular;  Laterality: N/A;  . CORONARY STENT INTERVENTION N/A 08/16/2017   Procedure: CORONARY STENT INTERVENTION;  Surgeon: Nigel Mormon, MD;  Location: [place] [facility];  Service: Cardiovascular;  Laterality: N/A;  . CORONARY STENT INTERVENTION N/A 10/09/2019   Procedure: CORONARY STENT INTERVENTION;  Surgeon: Adrian Prows, MD;  Location: [place] [facility];  Service: Cardiovascular;  Laterality: N/A;  . ELBOW SURGERY Left ?2001   "pinched nerve"  . ENDOSCOPIC MUCOSAL RESECTION  12/18/2018   Procedure: ENDOSCOPIC MUCOSAL RESECTION;  Surgeon: Rush Landmark Telford Nab., MD;  Location: [place];  Service: Gastroenterology;;  . Otho Darner SIGMOIDOSCOPY N/A 12/18/2018   Procedure: Beryle Quant;  Surgeon: Irving Copas., MD;  Location: [place];  Service: Gastroenterology;  Laterality:  N/A;  . HEMOSTASIS CLIP PLACEMENT  12/18/2018   Procedure: HEMOSTASIS CLIP PLACEMENT;  Surgeon: Irving Copas., MD;  Location: [place];  Service: Gastroenterology;;  . LEFT HEART CATH AND CORONARY ANGIOGRAPHY N/A 01/11/2019   Procedure: LEFT HEART CATH AND CORONARY ANGIOGRAPHY;  Surgeon: Adrian Prows, MD;  Location: [place] [facility];  Service: Cardiovascular;  Laterality: N/A;  . LEFT HEART CATH AND CORS/GRAFTS ANGIOGRAPHY N/A 08/04/2017   Procedure: LEFT HEART CATH AND CORS/GRAFTS ANGIOGRAPHY;  Surgeon: Adrian Prows, MD;  Location: [place] [facility];  Service: Cardiovascular;  Laterality: N/A;  . LEFT HEART CATH AND CORS/GRAFTS ANGIOGRAPHY N/A  08/20/2017   Procedure: LEFT HEART CATH AND CORS/GRAFTS ANGIOGRAPHY;  Surgeon: Nigel Mormon, MD;  Location: [place] [facility];  Service: Cardiovascular;  Laterality: N/A;  . LEFT HEART CATH AND CORS/GRAFTS ANGIOGRAPHY N/A 01/08/2019   Procedure: LEFT HEART CATH AND CORS/GRAFTS ANGIOGRAPHY;  Surgeon: Wellington Hampshire, MD;  Location: [place] [facility];  Service: Cardiovascular;  Laterality: N/A;  . LEFT HEART CATH AND CORS/GRAFTS ANGIOGRAPHY N/A 10/09/2019   Procedure: LEFT HEART CATH AND CORS/GRAFTS ANGIOGRAPHY;  Surgeon: Adrian Prows, MD;  Location: [place] [facility];  Service: Cardiovascular;  Laterality: N/A;  . POLYPECTOMY  09/19/2018   Procedure: POLYPECTOMY;  Surgeon: Thornton Park, MD;  [facility];  Service: Gastroenterology;;  . Lia Foyer INJECTION  09/19/2018   Procedure: SUBMUCOSAL INJECTION;  Surgeon: Thornton Park, MD;  [facility];  Service: Gastroenterology;;  . Lia Foyer LIFTING INJECTION  12/18/2018   Procedure: SUBMUCOSAL LIFTING INJECTION;  Surgeon: Irving Copas., MD;  Location[facility];  Service: Gastroenterology;;    Family History  Problem Relation Age of Onset  . Uterine cancer Mother   . Lung cancer Mother   . Hyperlipidemia Father   . Heart disease Father   . Hypertension Father   . Diabetes Father   . Colon cancer Neg Hx   . Esophageal cancer Neg Hx   . Colon polyps Neg Hx   . Rectal cancer Neg Hx   . Stomach cancer Neg Hx   . Inflammatory bowel disease Neg Hx   . Liver disease Neg Hx   . Pancreatic cancer Neg Hx    Social History   Tobacco Use  . Smoking status: Former Smoker    Packs/day: 0.25    Years: 27.00    Pack years: 6.75    Types: Cigars    Quit date: 2002    Years since quitting: 19.7  . Smokeless tobacco: Former Systems developer    Quit date: 2002  Substance Use Topics  . Alcohol use: Not Currently    Marital Status: Married ROS  Review of Systems  Cardiovascular: Positive for dyspnea on  exertion (improving) and leg swelling. Negative for chest pain, claudication, orthopnea, palpitations, paroxysmal nocturnal dyspnea and syncope.  Respiratory: Negative for shortness of breath.   Hematologic/Lymphatic: Does not bruise/bleed easily.  Gastrointestinal: Negative for melena.  Neurological: Negative for dizziness.   Objective  Blood pressure 122/77, pulse 80, resp. rate 16, height 5\' 7"  (1.702 m), weight 219 lb 12.8 oz (99.7 kg), SpO2 97 %.  Vitals with BMI 10/17/2019 10/10/2019 10/09/2019  Height 5\' 7"  5\' 7"  -  Weight 219 lbs 13 oz 223 lbs -  BMI 09.32 67.12 -  Systolic 458 099 833  Diastolic 77 78 81  Pulse 80 85 78     Physical Exam Vitals reviewed.  Constitutional:      Comments: He is moderately built  and mildly obese in no acute distress.  Cardiovascular:     Rate and Rhythm: Normal rate and regular rhythm.     Pulses:          Carotid pulses are 1+ on the right side with bruit and 1+ on the left side with bruit.      Femoral pulses are 2+ on the right side and 2+ on the left side.      Popliteal pulses are 1+ on the right side and 1+ on the left side.       Dorsalis pedis pulses are 0 on the right side and 0 on the left side.       Posterior tibial pulses are 1+ on the right side and 0 on the left side.     Heart sounds: Normal heart sounds.     Comments: No JVD.  1+ pitting edema below knee bilateral  Pulmonary:     Effort: Pulmonary effort is normal. No accessory muscle usage or respiratory distress.     Breath sounds: Normal breath sounds.  Abdominal:     Comments: obese    Laboratory examination:   Recent Labs    04/18/19 1019 10/09/19 0005 10/09/19 0558  NA 135 133* 134*  K 4.7 4.5 4.2  CL 98 98 100  CO2 21 21* 26  GLUCOSE 473* 558* 488*  BUN 31* 23 25*  CREATININE 1.65* 1.59* 1.63*  CALCIUM 9.5 8.7* 8.5*  GFRNONAA 44* 46* 44*  GFRAA 50* 53* 51*   estimated creatinine clearance is 52.2 mL/min (A) (by C-G formula based on SCr of 1.63 mg/dL  (H)).  CMP Latest Ref Rng & Units 10/09/2019 10/09/2019 04/18/2019  Glucose 70 - 99 mg/dL 488(H) 558(HH) 473(H)  BUN 8 - 23 mg/dL 25(H) 23 31(H)  Creatinine 0.61 - 1.24 mg/dL 1.63(H) 1.59(H) 1.65(H)  Sodium 135 - 145 mmol/L 134(L) 133(L) 135  Potassium 3.5 - 5.1 mmol/L 4.2 4.5 4.7  Chloride 98 - 111 mmol/L 100 98 98  CO2 22 - 32 mmol/L 26 21(L) 21  Calcium 8.9 - 10.3 mg/dL 8.5(L) 8.7(L) 9.5  Total Protein 6.5 - 8.1 g/dL 6.2(L) 7.4 6.6  Total Bilirubin 0.3 - 1.2 mg/dL 0.7 1.0 0.3  Alkaline Phos 38 - 126 U/L 118 155(H) 116  AST 15 - 41 U/L 17 23 12   ALT 0 - 44 U/L 15 18 13    CBC Latest Ref Rng & Units 10/09/2019 10/09/2019 01/11/2019  WBC 4.0 - 10.5 K/uL 10.6(H) 17.0(H) 7.9  Hemoglobin 13.0 - 17.0 g/dL 11.7(L) 13.9 11.3(L)  Hematocrit 39 - 52 % 37.1(L) 44.1 33.2(L)  Platelets 150 - 400 K/uL 177 266 181   Lipid Panel Recent Labs    10/30/18 1030 10/30/18 1030 12/27/18 0814 01/04/19 0640 04/18/19 1019  CHOL 144   < > 103 142 127  TRIG 240.0*   < > 182.0* 148 181*  LDLCALC  --   --  34 67 58  VLDL 48.0*  --  36.4 30  --   HDL 39.60   < > 32.10* 45 39*  CHOLHDL 4  --  3 3.2  --   LDLDIRECT 71.0  --   --   --   --    < > = values in this interval not displayed.    HEMOGLOBIN A1C Lab Results  Component Value Date   HGBA1C 11.7 (H) 10/09/2019   MPG 289.09 10/09/2019   TSH Recent Labs    04/18/19 1019 10/09/19 0559  TSH  1.460 1.422   BNP    Component Value Date/Time   BNP 2,026.8 (H) 10/09/2019 0005    Medications and allergies   Allergies  Allergen Reactions  . Diltiazem Rash     Current Outpatient Medications  Medication Instructions  . amiodarone (PACERONE) 100 mg, Oral, Daily  . aspirin 81 mg, Oral, Daily  . carvedilol (COREG) 6.25 mg, Oral, 2 times daily with meals  . Fish Oil 1,000 mg, Oral, 2 times daily  . insulin aspart protamine- aspart (NOVOLOG MIX 70/30) (70-30) 100 UNIT/ML injection Inject 0.66 mLs (66 Units total) into the skin daily with  breakfast AND 0.6 mLs (60 Units total) daily with supper.  . isosorbide mononitrate (IMDUR) 30 mg, Oral, Daily  . liraglutide (VICTOZA) 1.8 mg, Subcutaneous, Daily  . metFORMIN (GLUCOPHAGE) 1,000 mg, Oral, 2 times daily with meals, Taking 2 tabs twice a day  . nitroGLYCERIN (NITROSTAT) 0.4 mg, Sublingual, Every 5 min x3 PRN  . potassium chloride SA (KLOR-CON) 20 MEQ tablet TAKE 1 TABLET BY MOUTH TWICE A DAY  . PRESCRIPTION MEDICATION Inhalation, Daily at bedtime, CPAP  . rosuvastatin (CRESTOR) 40 mg, Oral, Daily at bedtime  . sertraline (ZOLOFT) 50 mg, Oral, Daily at bedtime  . ticagrelor (BRILINTA) 90 mg, Oral, 2 times daily  . torsemide (DEMADEX) 20 mg, Oral, Daily PRN  . Vitamin D3 2,000 Units, Oral, 2 times daily   Radiology:  No results found.  Chest X-Ray 10/09/2019:  FINDINGS: Unchanged cardiomegaly. Worsened bilateral interstitial opacities. Remote median sternotomy with unchanged position of left chest wall 3 lead AICD. IMPRESSION: Cardiomegaly and worsened bilateral interstitial opacities likely indicating pulmonary edema.  Cardiac Studies:   Renal artery duplex 09/02/2017: Right: Abnormal right Resistive Index. No evidence of right renal  artery stenosis. Left: Normal left Resistive Index. No evidence of left renal artery stenosis. Mesenteric: Normal Celiac artery findings. 70 to 99% stenosis in the superior mesenteric artery.  Left Heart Catheterization12/14/2020:  1. Severe underlying three-vessel coronary artery disease with patent LIMA to LAD. Chronically occluded SVG to OM. Severe in-stent restenosis in left main stent extending into the proximal left circumflex. In addition, there is significant restenosis in the stent placed in the left posterior AV groove artery. The RCA is known to be small in size and severely diseased. 2. Left ventricular angiography was not performed due to chronic kidney disease. LVEDP was 13 mmHg.  Unscheduled (Alert) 07/25/2019: 1  nonsustained episode of ventricular tachycardia. On 6.30.21 (17:43) an episode of VT detected in the VF zone was effectively terminated via shock. Trans-thoracic impedance trends and the Optivol Fluid Index do no present significant abnormalities. Battery longevity is 3.1 years. RA pacing is 23.0 %, RV pacing is 98.2 %, and LV pacing is 99.0 %.  Coronary angioplasty of the left main and mid circumflex coronary artery 01/11/2019: Successful Wolverine cutting balloon angioplasty of the left main, 3.5 x 10 mm balloon utilized and high-pressure 12 atmospheric pressure inflations performed throughout the in-stent restenotic left main and proximal ostial circumflex and also mid circumflex coronary artery, 99% reduced to 0% with TIMI II to TIMI-3 flow. 60 mill contrast utilized.  Echocardiogram 06/15/2019:  Left ventricle cavity is moderately dilated. Moderate concentric  hypertrophy of the left ventricle. Doppler evidence of grade III  (restrictive) diastolic dysfunction, elevated LAP. Left ventricle regional  wall motion findings: Basal inferolateral akinesis. 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 Mild global hypokinesis. Abnormal septal wall motion due  to post-operative coronary artery bypass graft. Moderately depressed LV  systolic function with visual EF 35-40%.  Left atrial cavity is mildly dilated at 4.3 cm.  Right ventricle cavity is normal in size. Normal right ventricular  function. Pacemaker lead/ICD lead noted in the RV.  Trileaflet aortic valve. Mild (Grade I) aortic regurgitation.  Compared to 01/04/2019, EF improved from 25-30%  Scheduled Remote ICD check 07/18/2019:  No VT/VF episodes. No therapy. 1 NSVT for 6 beats. VSE most likely represents sinus tachycardia vs SVT; longest episode occurred on 07/03/19 lasting 3 minutes and 52 seconds. This was noted previously as well.  Health trends (patient activity, heart rate variability, average heart rates) are stable. Trans-thoracic impedance  trends and the Optivol Fluid Index do no present significant abnormalities. Battery longevity is 3.2 years. RA pacing is 55.6 %, RV pacing is 99.5 %, and LV pacing is 99.5 %.  Unscheduled (Alert) 07/25/2019: 1 nonsustained episode of ventricular tachycardia. On 6.30.21 (17:43) an episode of VT detected in the VF zone was effectively terminated via shock. Trans-thoracic impedance trends and the Optivol Fluid Index do no present significant abnormalities. Battery longevity is 3.1 years. RA pacing is 23.0 %, RV pacing is 98.2 %, and LV pacing is 99.0 %.  Left Heart Catheterization 10/09/2019:  LV: Normal LVEDP.  There was no pressure gradient across the aortic valve.  Angiogram not performed to conserve contrast. Right coronary artery nondominant and severely diffusely diseased unchanged from prior cardiac catheterization. Left main: Mid to distal left main has in-stent restenosis of 99% and extends into the dominant circumflex which also has proximal in-stent 99% stenosis with TIMI II flow.  Successful scoring balloon angioplasty with 3.5 x 15 mm Wolverine at high 14 atmospheric pressure, due to recall, haziness, stented with 4.0 x 18 mm resolute Onyx DES at 18 atmospheric pressure for 60 seconds, stenosis reduced in the left main and proximal in-stent restenosis from 99% to 0% with improvement in TIMI flow from 2-3. Successful PTCA and Cutting Balloon angioplasty with Wolverine 3.0 x 10 mm balloon in the mid segment of the dominant circumflex coronary artery in-stent restenosis.  Stenosis reduced to 0% at 14 atmospheric pressure inflations x2.  TIMI-3 to TIMI-3 flow maintained. LAD: Is occluded in the proximal segment.  Distal LAD supplied by LIMA.  LIMA to LAD is widely patent.  Recommendation: Patient will be observed for 6 to 8 hours, will be ambulated, if he remains stable, I would like to discharge him today in view of COVID-19 pandemic.  70 mL of contrast was utilized.  Extremely complex patient  but with excellent results and hopefully he will not have recurrent restenosis in the left main and proximal circumflex stent. With regard to risk factor modification, he is on appropriate medical therapy, he is also on dual antiplatelet therapy.  Diabetes needs to be controlled better   EKG: 10/17/2019: AV paced rhythm at rate of 75 bpm. No further analysis.    Assessment     ICD-10-CM   1. Coronary artery disease involving autologous artery coronary bypass graft without angina pectoris  I25.810 EKG [id number]    Basic metabolic panel    Pro b natriuretic peptide (BNP)[facility])    sacubitril-valsartan (ENTRESTO) 49-51 mg per tablet  2. Ischemic cardiomyopathy  I25.5   3. Chronic combined systolic and diastolic heart failure (HCC)  I50.42   4. Essential hypertension  Q65 Basic metabolic panel  5. ICD; Biventricular  Medtronic ICD Amplia MRI QWuad CRT-D  in situ 10/29/14  Z95.810   6. Stage 3b chronic  kidney disease  N18.32     Meds ordered this encounter  Medications  . sacubitril-valsartan (ENTRESTO) 49-51 mg per tablet    Order Specific Question:   ACE-inhibitors have NOT been administered in the past 36-hours.    Answer:   YES (confirmed by ordering provider)   Medications Discontinued During This Encounter  Medication Reason  . sacubitril-valsartan (ENTRESTO) 97-103 MG Change in therapy  . sacubitril-valsartan (ENTRESTO) 24-26 MG Change in therapy    Recommendations:   Billye Nydam  is a 65 y.o. Caucasian male with CAD and history of CABG x2 in 2002  with only LIMA to LAD being patent, Circumflex coronary artery is super dominant. History of stenting to circumflex/OM bifurcation due to recurrent restenosis in 2015, left main and circumflex proximal and midsegment in 2016, angioplasty to left main and proximal and mid circumflex in 2020 and again 10/08/2019.  He also has a history of VT with ICD, ischemic cardiomyopathy, chronic systolic and diastolic heart  failure, uncontrolled diabetes mellitus, hypertension, stage III chronic kidney disease, obstructive sleep apnea on CPAP.   Patient is presently doing well without angina, and significant improvement in shortness of breath. There are no clinical signs of heart failure at this time.  Patient reports he is currently taking 24-26 mg of Entresto twice daily, however according to our records in the past he has been on 97-103 mg of Entresto.  Renal function has remained at baseline.  Will increase Entresto from 24-26mg  to 49-51 mg twice daily.  Will obtain BMP and pro BNP in 1 week.  Patient was discharged on 6.25 mg of carvedilol twice daily, however he is reportedly only taking this once per day.  Will increase carvedilol from 6.25 mg once daily to 6.25 mg twice daily.  As patient's blood pressure is 122/77 and heart rate 80 bpm, I believe that patient will tolerate increasing both Entresto and carvedilol today.  Encouraged him to monitor blood pressure and heart rate at home, and to let our office know if he experiences symptoms of hypotension or bradycardia.  Also encouraged him to stay well-hydrated.  Blood pressure is currently well controlled.  At next visit we will consider titrating Entresto if tolerated or carvedilol.  Of note patient does have active referral to cardiac rehab, encouraged him to follow-up if he has not heard from them to schedule in the next 2 weeks.   Follow-up in 2 weeks for medication titration following lab work in 1 week.  Patient was seen in collaboration with Dr. Virgina Jock. He also reviewed patient's chart and Dr. Virgina Jock is in agreement of the plan.    Alethia Berthold, PA-C 10/17/2019, 12:27 PM Office: [phone]

## 2019-10-16 NOTE — Telephone Encounter (Signed)
PA [place], Thank you for update. Understand. We would recommend holding on repeat procedure for now as well. Celina about your patient and the need to hold on follow up due to recent NSTEMI and PCI Intervention. Patty - please put back in our que for rescheduling from 28-months from now. If the patient wants [place] to do a look without intervention, we certainly could offer him that, but I think it is OK to wait for 6-more months. Thanks to all. GM

## 2019-10-16 NOTE — Telephone Encounter (Signed)
Dr Rush Landmark Juluis Rainier

## 2019-10-16 NOTE — Telephone Encounter (Signed)
The pt has been advised that the procedure has been cancelled. COVID test has also been cancelled. Recall entered

## 2019-10-16 NOTE — Telephone Encounter (Signed)
Agree with cautious approach with hopeful procedure in 6 months. Thanks for your help.

## 2019-10-17 ENCOUNTER — Encounter: Payer: Self-pay | Admitting: Student

## 2019-10-17 ENCOUNTER — Ambulatory Visit: Payer: No Typology Code available for payment source | Admitting: Student

## 2019-10-17 ENCOUNTER — Other Ambulatory Visit: Payer: Self-pay

## 2019-10-17 VITALS — BP 122/77 | HR 80 | Resp 16 | Ht 67.0 in | Wt 219.8 lb

## 2019-10-17 DIAGNOSIS — Z95810 Presence of automatic (implantable) cardiac defibrillator: Secondary | ICD-10-CM

## 2019-10-17 DIAGNOSIS — I5042 Chronic combined systolic (congestive) and diastolic (congestive) heart failure: Secondary | ICD-10-CM

## 2019-10-17 DIAGNOSIS — I10 Essential (primary) hypertension: Secondary | ICD-10-CM

## 2019-10-17 DIAGNOSIS — I255 Ischemic cardiomyopathy: Secondary | ICD-10-CM

## 2019-10-17 DIAGNOSIS — N1832 Chronic kidney disease, stage 3b: Secondary | ICD-10-CM

## 2019-10-17 DIAGNOSIS — I25810 Atherosclerosis of coronary artery bypass graft(s) without angina pectoris: Secondary | ICD-10-CM

## 2019-10-17 MED ORDER — SACUBITRIL-VALSARTAN 49-51 MG PO TABS: 1.0000 | ORAL_TABLET | Freq: Two times a day (BID) | ORAL | Status: DC

## 2019-10-18 ENCOUNTER — Other Ambulatory Visit (HOSPITAL_COMMUNITY): Payer: No Typology Code available for payment source

## 2019-10-19 NOTE — Addendum Note (Signed)
Addended by: Lawerance Cruel L on: 10/19/2019 11:04 AM   Modules accepted: Orders

## 2019-10-22 ENCOUNTER — Ambulatory Visit (HOSPITAL_COMMUNITY): Admit: 2019-10-22 | Payer: No Typology Code available for payment source | Admitting: Gastroenterology

## 2019-10-22 ENCOUNTER — Encounter (HOSPITAL_COMMUNITY): Payer: Self-pay

## 2019-10-22 SURGERY — SIGMOIDOSCOPY, FLEXIBLE
Anesthesia: Monitor Anesthesia Care

## 2019-10-23 DIAGNOSIS — R0602 Shortness of breath: Secondary | ICD-10-CM | POA: Diagnosis not present

## 2019-10-23 DIAGNOSIS — I25810 Atherosclerosis of coronary artery bypass graft(s) without angina pectoris: Secondary | ICD-10-CM | POA: Diagnosis not present

## 2019-10-24 LAB — PRO B NATRIURETIC PEPTIDE: NT-Pro BNP: 3952 pg/mL — ABNORMAL HIGH (ref 0–210)

## 2019-10-25 ENCOUNTER — Other Ambulatory Visit: Payer: Self-pay

## 2019-10-25 DIAGNOSIS — I25810 Atherosclerosis of coronary artery bypass graft(s) without angina pectoris: Secondary | ICD-10-CM

## 2019-10-25 DIAGNOSIS — I5022 Chronic systolic (congestive) heart failure: Secondary | ICD-10-CM | POA: Diagnosis not present

## 2019-10-26 ENCOUNTER — Other Ambulatory Visit: Payer: Self-pay | Admitting: Cardiology

## 2019-10-26 LAB — SPECIMEN STATUS REPORT

## 2019-10-26 LAB — BASIC METABOLIC PANEL
BUN/Creatinine Ratio: 21 (ref 10–24)
BUN: 28 mg/dL — ABNORMAL HIGH (ref 8–27)
CO2: 20 mmol/L (ref 20–29)
Calcium: 9.4 mg/dL (ref 8.6–10.2)
Chloride: 100 mmol/L (ref 96–106)
Creatinine, Ser: 1.32 mg/dL — ABNORMAL HIGH (ref 0.76–1.27)
GFR calc Af Amer: 66 mL/min/{1.73_m2} (ref >59)
GFR calc non Af Amer: 57 mL/min/{1.73_m2} — ABNORMAL LOW (ref >59)
Glucose: 356 mg/dL — ABNORMAL HIGH (ref 65–99)
Potassium: 4.6 mmol/L (ref 3.5–5.2)
Sodium: 136 mmol/L (ref 134–144)

## 2019-10-29 NOTE — Progress Notes (Signed)
Please inform patient renal function and electrolytes are stable. Pro-BNP is still high, which we expect at this point. Will discuss further at upcoming visit.

## 2019-10-29 NOTE — Progress Notes (Signed)
Relayed information to patient. Patient voiced understanding.  

## 2019-10-30 NOTE — Progress Notes (Signed)
Primary Physician/Referring:  [name], MD  Patient ID: Martin Bush, male    DOB: 02/15/1955, 64 y.o.   MRN: [id number]  Chief Complaint  Patient presents with  . Hypertension    heart failure, med titration  . Follow-up   HPI:    Martin Bush  is a 64 y.o. Caucasian male with CAD and history of CABG x2 in 2002  with only LIMA to LAD being patent, Circumflex coronary artery is super dominant. History of stenting to circumflex/OM bifurcation due to recurrent restenosis in 2015, left main and circumflex proximal and midsegment in 2016, angioplasty to left main and proximal and mid circumflex in 2020 and again 10/08/2019.  He also has a history of VT with ICD, ischemic cardiomyopathy, chronic systolic and diastolic heart failure.  He was recently admitted to the hospital on 10/08/2019 with acute pulmonary edema and non-ST elevation MI for which she underwent cardiac catheterization with angioplasty to left main and left circumflex coronary arteries.  Patient presents for 2 week follow up. At last visit increased Entresto to 49/51mg  from 24/26 mg and increased carvedilol from 6.25 once daily to 6.25mg  twice daily.  He is presently doing well and tolerating medication increases from last visit.  Denies chest pain, shortness of breath PND, orthopnea.  Does report continued mild bilateral lower leg swelling.  Reports he continues to take torsemide 20 mg daily as needed for volume overload.  He has taken torsemide 5 times in the last 2 weeks when he notes increase in daily weight.  He is tolerating dual antiplatelet therapy without bleeding diathesis.  He continues to slowly increase his daily activity without issue.  He has not yet scheduled cardiac rehab.  Past Medical History:  Diagnosis Date  . AICD (automatic cardioverter/defibrillator) present    Medtronic  . Arthritis    "thumbs"  (08/02/2017)  . CHF (congestive heart failure) ([place])   . Chronic combined systolic and diastolic heart  failure ([place])    a. 08/2017 Echo: EF 20-25%, mod glob HK. Sev distal ant sept, inflat HK. Apical AK. Gr2 DD. Mildly reduced RV fxn.  . Colon polyps   . Coronary artery disease    a. s/p CABG x 2 (LIMA->LAD, VG->OM); b. Multiple PCI's to LM/LCX/OM; c. 07/2017 PTCA of LM/LCX w/ early ISR-->repeat PCI/DES to LM (3.5x20 Synergy DES) and LCX (3.0x20 Synergy DES); d. 07/2018 Relook Cath: LM patent stent, LAD 100ost, LCX patent stent, OM1 99/60, LIMA->LAD ok. VG->dLCX 64 (old).  . Depression   . Encounter for assessment of implantable cardioverter-defibrillator (ICD) 09/27/2018  . High cholesterol   . Hypertension   . ICD; Biventricular  Medtronic ICD Amplia MRI QWuad CRT-D  in situ 10/29/14 10/29/2014   Remote ICD check 09.23.20:  One 6 beat NSVT. No therapy.  1 SVT episode @ 130 bpm (38 Sec).  There were 23 Vent sense episodes detected for up to 1.1 min/day (AT). Health trends (patient activity, heart rate variability, average heart rates) are stable.Trans-thoracic impedance trends and the OptiVol Fluid Index do no present significant abnormalities. Battery longevity is 4.3 years. RA pacing is 3  . Ischemic cardiomyopathy 09/27/2018  . MI (myocardial infarction) ([place]) 2003  . NSTEMI (non-ST elevated myocardial infarction) ([place]) 07/16/2014  . OSA on CPAP   . Oxygen deficiency   . Pneumonia 10/2014  . Proteinuria   . Sleep apnea   . Type II diabetes mellitus (HCC)    insulin dependent   Past Surgical History:  Procedure Laterality Date  .  BIOPSY  09/19/2018   Procedure: BIOPSY;  Surgeon: Thornton Park, MD;  Location: [facility];  Service: Gastroenterology;;  . CARDIAC CATHETERIZATION N/A 07/16/2014   Procedure: Left Heart Cath and Coronary Angiography;  Surgeon: Adrian Prows, MD;  Location: [place] [facility];  Service: Cardiovascular;  Laterality: N/A;  . CARDIAC CATHETERIZATION  07/16/2014   Procedure: Coronary Balloon Angioplasty;  Surgeon: Adrian Prows, MD;  Location: [place] [facility];  Service:  Cardiovascular;;  . CARDIAC CATHETERIZATION  2003  . CARDIAC DEFIBRILLATOR PLACEMENT  2016  . CATARACT EXTRACTION W/ INTRAOCULAR LENS IMPLANT Left 03/2014  . COLONOSCOPY    . COLONOSCOPY WITH PROPOFOL N/A 09/19/2018   Procedure: COLONOSCOPY WITH PROPOFOL;  Surgeon: Thornton Park, MD;  Location: [facility];  Service: Gastroenterology;  Laterality: N/A;  . CORONARY ANGIOPLASTY WITH STENT PLACEMENT     "I've got a total of 8 stents in there; mostly doine at [facility] [address]" (08/02/2017)  . CORONARY ARTERY BYPASS GRAFT  ~ 2003   "CABG X2"; [facility]  . CORONARY ATHERECTOMY N/A 08/16/2017   Procedure: CORONARY ATHERECTOMY;  Surgeon: Nigel Mormon, MD;  Location: [place] [facility];  Service: Cardiovascular;  Laterality: N/A;  . CORONARY BALLOON ANGIOPLASTY N/A 08/04/2017   Procedure: CORONARY BALLOON ANGIOPLASTY;  Surgeon: Adrian Prows, MD;  Location: [place] [facility];  Service: Cardiovascular;  Laterality: N/A;  . CORONARY BALLOON ANGIOPLASTY N/A 01/11/2019   Procedure: CORONARY BALLOON ANGIOPLASTY;  Surgeon: Adrian Prows, MD;  Location: [place] [facility];  Service: Cardiovascular;  Laterality: N/A;  . CORONARY BALLOON ANGIOPLASTY N/A 10/09/2019   Procedure: CORONARY BALLOON ANGIOPLASTY;  Surgeon: Adrian Prows, MD;  Location: [place] [facility];  Service: Cardiovascular;  Laterality: N/A;  . CORONARY STENT INTERVENTION N/A 08/16/2017   Procedure: CORONARY STENT INTERVENTION;  Surgeon: Nigel Mormon, MD;  Location: [place] [facility];  Service: Cardiovascular;  Laterality: N/A;  . CORONARY STENT INTERVENTION N/A 10/09/2019   Procedure: CORONARY STENT INTERVENTION;  Surgeon: Adrian Prows, MD;  Location: [place] [facility];  Service: Cardiovascular;  Laterality: N/A;  . ELBOW SURGERY Left ?2001   "pinched nerve"  . ENDOSCOPIC MUCOSAL RESECTION  12/18/2018   Procedure: ENDOSCOPIC MUCOSAL RESECTION;  Surgeon: Rush Landmark Telford Nab., MD;  Location: [place];  Service: Gastroenterology;;   . Otho Darner SIGMOIDOSCOPY N/A 12/18/2018   Procedure: Beryle Quant;  Surgeon: Irving Copas., MD;  Location: [place];  Service: Gastroenterology;  Laterality: N/A;  . HEMOSTASIS CLIP PLACEMENT  12/18/2018   Procedure: HEMOSTASIS CLIP PLACEMENT;  Surgeon: Irving Copas., MD;  Location: [place];  Service: Gastroenterology;;  . LEFT HEART CATH AND CORONARY ANGIOGRAPHY N/A 01/11/2019   Procedure: LEFT HEART CATH AND CORONARY ANGIOGRAPHY;  Surgeon: Adrian Prows, MD;  Location: [place] [facility];  Service: Cardiovascular;  Laterality: N/A;  . LEFT HEART CATH AND CORS/GRAFTS ANGIOGRAPHY N/A 08/04/2017   Procedure: LEFT HEART CATH AND CORS/GRAFTS ANGIOGRAPHY;  Surgeon: Adrian Prows, MD;  Location: [place] [facility];  Service: Cardiovascular;  Laterality: N/A;  . LEFT HEART CATH AND CORS/GRAFTS ANGIOGRAPHY N/A 08/20/2017   Procedure: LEFT HEART CATH AND CORS/GRAFTS ANGIOGRAPHY;  Surgeon: Nigel Mormon, MD;  Location: [place] [facility];  Service: Cardiovascular;  Laterality: N/A;  . LEFT HEART CATH AND CORS/GRAFTS ANGIOGRAPHY N/A 01/08/2019   Procedure: LEFT HEART CATH AND CORS/GRAFTS ANGIOGRAPHY;  Surgeon: Wellington Hampshire, MD;  Location: [place] [facility];  Service: Cardiovascular;  Laterality: N/A;  . LEFT HEART CATH AND CORS/GRAFTS ANGIOGRAPHY N/A  10/09/2019   Procedure: LEFT HEART CATH AND CORS/GRAFTS ANGIOGRAPHY;  Surgeon: Adrian Prows, MD;  Location: [place] [facility];  Service: Cardiovascular;  Laterality: N/A;  . POLYPECTOMY  09/19/2018   Procedure: POLYPECTOMY;  Surgeon: Thornton Park, MD;  [facility];  Service: Gastroenterology;;  . Lia Foyer INJECTION  09/19/2018   Procedure: SUBMUCOSAL INJECTION;  Surgeon: Thornton Park, MD;  [facility];  Service: Gastroenterology;;  . Lia Foyer LIFTING INJECTION  12/18/2018   Procedure: SUBMUCOSAL LIFTING INJECTION;  Surgeon: Irving Copas., MD;  Location: [facility];   Service: Gastroenterology;;    Family History  Problem Relation Age of Onset  . Uterine cancer Mother   . Lung cancer Mother   . Hyperlipidemia Father   . Heart disease Father   . Hypertension Father   . Diabetes Father   . Colon cancer Neg Hx   . Esophageal cancer Neg Hx   . Colon polyps Neg Hx   . Rectal cancer Neg Hx   . Stomach cancer Neg Hx   . Inflammatory bowel disease Neg Hx   . Liver disease Neg Hx   . Pancreatic cancer Neg Hx    Social History   Tobacco Use  . Smoking status: Former Smoker    Packs/day: 0.25    Years: 27.00    Pack years: 6.75    Types: Cigars    Quit date: 2002    Years since quitting: 19.7  . Smokeless tobacco: Former Systems developer    Quit date: 2002  Substance Use Topics  . Alcohol use: Not Currently    Marital Status: Married ROS  Review of Systems  Cardiovascular: Positive for dyspnea on exertion (improved) and leg swelling. Negative for chest pain, claudication, orthopnea, palpitations, paroxysmal nocturnal dyspnea and syncope.  Respiratory: Negative for shortness of breath.   Hematologic/Lymphatic: Does not bruise/bleed easily.  Gastrointestinal: Negative for melena.  Neurological: Negative for dizziness.   Objective  Blood pressure 135/77, pulse 69, height 5\' 7"  (1.702 m), weight 220 lb (99.8 kg), SpO2 96 %.  Vitals with BMI 10/31/2019 10/17/2019 10/10/2019  Height 5\' 7"  5\' 7"  5\' 7"   Weight 220 lbs 219 lbs 13 oz 223 lbs  BMI 34.45 37.90 24.09  Systolic 735 329 924  Diastolic 77 77 78  Pulse 69 80 85     Physical Exam Vitals reviewed.  Constitutional:      Comments: He is moderately built and mildly obese in no acute distress.  Cardiovascular:     Rate and Rhythm: Normal rate and regular rhythm.     Pulses:          Carotid pulses are 1+ on the right side with bruit and 1+ on the left side with bruit.      Femoral pulses are 2+ on the right side and 2+ on the left side.      Popliteal pulses are 1+ on the right side and 1+ on the  left side.       Dorsalis pedis pulses are 0 on the right side and 0 on the left side.       Posterior tibial pulses are 1+ on the right side and 0 on the left side.     Heart sounds: Normal heart sounds.     Comments: No JVD.  1+ pitting edema below knee bilateral  Pulmonary:     Effort: Pulmonary effort is normal. No accessory muscle usage or respiratory distress.     Breath sounds: Normal breath sounds.  Laboratory examination:   Recent Labs    10/09/19 0005 10/09/19 0558 10/23/19 1347  NA 133* 134* 136  K 4.5 4.2 4.6  CL 98 100 100  CO2 21* 26 20  GLUCOSE 558* 488* 356*  BUN 23 25* 28*  CREATININE 1.59* 1.63* 1.32*  CALCIUM 8.7* 8.5* 9.4  GFRNONAA 46* 44* 57*  GFRAA 53* 51* 66   estimated creatinine clearance is 64.5 mL/min (A) (by C-G formula based on SCr of 1.32 mg/dL (H)).  CMP Latest Ref Rng & Units 10/23/2019 10/09/2019 10/09/2019  Glucose 65 - 99 mg/dL 356(H) 488(H) 558(HH)  BUN 8 - 27 mg/dL 28(H) 25(H) 23  Creatinine 0.76 - 1.27 mg/dL 1.32(H) 1.63(H) 1.59(H)  Sodium 134 - 144 mmol/L 136 134(L) 133(L)  Potassium 3.5 - 5.2 mmol/L 4.6 4.2 4.5  Chloride 96 - 106 mmol/L 100 100 98  CO2 20 - 29 mmol/L 20 26 21(L)  Calcium 8.6 - 10.2 mg/dL 9.4 8.5(L) 8.7(L)  Total Protein 6.5 - 8.1 g/dL - 6.2(L) 7.4  Total Bilirubin 0.3 - 1.2 mg/dL - 0.7 1.0  Alkaline Phos 38 - 126 U/L - 118 155(H)  AST 15 - 41 U/L - 17 23  ALT 0 - 44 U/L - 15 18   CBC Latest Ref Rng & Units 10/09/2019 10/09/2019 01/11/2019  WBC 4.0 - 10.5 K/uL 10.6(H) 17.0(H) 7.9  Hemoglobin 13.0 - 17.0 g/dL 11.7(L) 13.9 11.3(L)  Hematocrit 39 - 52 % 37.1(L) 44.1 33.2(L)  Platelets 150 - 400 K/uL 177 266 181   Lipid Panel Recent Labs    12/27/18 0814 01/04/19 0640 04/18/19 1019  CHOL 103 142 127  TRIG 182.0* 148 181*  LDLCALC 34 67 58  VLDL 36.4 30  --   HDL 32.10* 45 39*  CHOLHDL 3 3.2  --     HEMOGLOBIN A1C Lab Results  Component Value Date   HGBA1C 11.7 (H) 10/09/2019   MPG 289.09 10/09/2019    TSH Recent Labs    04/18/19 1019 10/09/19 0559  TSH 1.460 1.422   BNP    Component Value Date/Time   BNP 2,026.8 (H) 10/09/2019 0005    Medications and allergies   Allergies  Allergen Reactions  . Diltiazem Rash     Current Outpatient Medications  Medication Instructions  . amiodarone (PACERONE) 100 mg, Oral, Daily  . aspirin 81 mg, Oral, Daily  . carvedilol (COREG) 12.5 mg, Oral, 2 times daily  . Fish Oil 1,000 mg, Oral, 2 times daily  . insulin aspart protamine- aspart (NOVOLOG MIX 70/30) (70-30) 100 UNIT/ML injection Inject 0.66 mLs (66 Units total) into the skin daily with breakfast AND 0.6 mLs (60 Units total) daily with supper.  . isosorbide mononitrate (IMDUR) 30 mg, Oral, Daily  . liraglutide (VICTOZA) 1.8 mg, Subcutaneous, Daily  . metFORMIN (GLUCOPHAGE) 1,000 mg, Oral, 2 times daily with meals, Taking 2 tabs twice a day  . nitroGLYCERIN (NITROSTAT) 0.4 mg, Sublingual, Every 5 min x3 PRN  . potassium chloride SA (KLOR-CON) 20 MEQ tablet TAKE 1 TABLET BY MOUTH TWICE A DAY  . PRESCRIPTION MEDICATION Inhalation, Daily at bedtime, CPAP  . rosuvastatin (CRESTOR) 40 mg, Oral, Daily at bedtime  . sertraline (ZOLOFT) 50 mg, Oral, Daily at bedtime  . ticagrelor (BRILINTA) 90 mg, Oral, 2 times daily  . torsemide (DEMADEX) 20 MG tablet TAKE 1 TABLET BY MOUTH TWICE A DAY  . Vitamin D3 2,000 Units, Oral, 2 times daily   Radiology:  No results found.  Chest X-Ray 10/09/2019:  FINDINGS: Unchanged cardiomegaly. Worsened bilateral interstitial opacities. Remote median sternotomy with unchanged position of left chest wall 3 lead AICD. IMPRESSION: Cardiomegaly and worsened bilateral interstitial opacities likely indicating pulmonary edema.  Cardiac Studies:   Renal artery duplex 09/02/2017: Right: Abnormal right Resistive Index. No evidence of right renal  artery stenosis. Left: Normal left Resistive Index. No evidence of left renal artery stenosis. Mesenteric: Normal  Celiac artery findings. 70 to 99% stenosis in the superior mesenteric artery.  Left Heart Catheterization12/14/2020:  1. Severe underlying three-vessel coronary artery disease with patent LIMA to LAD. Chronically occluded SVG to OM. Severe in-stent restenosis in left main stent extending into the proximal left circumflex. In addition, there is significant restenosis in the stent placed in the left posterior AV groove artery. The RCA is known to be small in size and severely diseased. 2. Left ventricular angiography was not performed due to chronic kidney disease. LVEDP was 13 mmHg.  Unscheduled (Alert) 07/25/2019: 1 nonsustained episode of ventricular tachycardia. On 6.30.21 (17:43) an episode of VT detected in the VF zone was effectively terminated via shock. Trans-thoracic impedance trends and the Optivol Fluid Index do no present significant abnormalities. Battery longevity is 3.1 years. RA pacing is 23.0 %, RV pacing is 98.2 %, and LV pacing is 99.0 %.  Coronary angioplasty of the left main and mid circumflex coronary artery 01/11/2019: Successful Wolverine cutting balloon angioplasty of the left main, 3.5 x 10 mm balloon utilized and high-pressure 12 atmospheric pressure inflations performed throughout the in-stent restenotic left main and proximal ostial circumflex and also mid circumflex coronary artery, 99% reduced to 0% with TIMI II to TIMI-3 flow. 60 mill contrast utilized.  Echocardiogram 06/15/2019:  Left ventricle cavity is moderately dilated. Moderate concentric  hypertrophy of the left ventricle. Doppler evidence of grade III  (restrictive) diastolic dysfunction, elevated LAP. Left ventricle regional  wall motion findings: Basal inferolateral akinesis. 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 Mild global hypokinesis. Abnormal septal wall motion due  to post-operative coronary artery bypass graft. Moderately depressed LV  systolic function with visual EF 35-40%.  Left atrial  cavity is mildly dilated at 4.3 cm.  Right ventricle cavity is normal in size. Normal right ventricular  function. Pacemaker lead/ICD lead noted in the RV.  Trileaflet aortic valve. Mild (Grade I) aortic regurgitation.  Compared to 01/04/2019, EF improved from 25-30%  Scheduled Remote ICD check 07/18/2019:  No VT/VF episodes. No therapy. 1 NSVT for 6 beats. VSE most likely represents sinus tachycardia vs SVT; longest episode occurred on 07/03/19 lasting 3 minutes and 52 seconds. This was noted previously as well.  Health trends (patient activity, heart rate variability, average heart rates) are stable. Trans-thoracic impedance trends and the Optivol Fluid Index do no present significant abnormalities. Battery longevity is 3.2 years. RA pacing is 55.6 %, RV pacing is 99.5 %, and LV pacing is 99.5 %.  Unscheduled (Alert) 07/25/2019: 1 nonsustained episode of ventricular tachycardia. On 6.30.21 (17:43) an episode of VT detected in the VF zone was effectively terminated via shock. Trans-thoracic impedance trends and the Optivol Fluid Index do no present significant abnormalities. Battery longevity is 3.1 years. RA pacing is 23.0 %, RV pacing is 98.2 %, and LV pacing is 99.0 %.  Left Heart Catheterization 10/09/2019:  LV: Normal LVEDP.  There was no pressure gradient across the aortic valve.  Angiogram not performed to conserve contrast. Right coronary artery nondominant and severely diffusely diseased unchanged from prior cardiac catheterization. Left main: Mid to distal left main  has in-stent restenosis of 99% and extends into the dominant circumflex which also has proximal in-stent 99% stenosis with TIMI II flow.  Successful scoring balloon angioplasty with 3.5 x 15 mm Wolverine at high 14 atmospheric pressure, due to recall, haziness, stented with 4.0 x 18 mm resolute Onyx DES at 18 atmospheric pressure for 60 seconds, stenosis reduced in the left main and proximal in-stent restenosis from 99% to 0%  with improvement in TIMI flow from 2-3. Successful PTCA and Cutting Balloon angioplasty with Wolverine 3.0 x 10 mm balloon in the mid segment of the dominant circumflex coronary artery in-stent restenosis.  Stenosis reduced to 0% at 14 atmospheric pressure inflations x2.  TIMI-3 to TIMI-3 flow maintained. LAD: Is occluded in the proximal segment.  Distal LAD supplied by LIMA.  LIMA to LAD is widely patent.  Recommendation: Patient will be observed for 6 to 8 hours, will be ambulated, if he remains stable, I would like to discharge him today in view of COVID-19 pandemic.  70 mL of contrast was utilized.  Extremely complex patient but with excellent results and hopefully he will not have recurrent restenosis in the left main and proximal circumflex stent. With regard to risk factor modification, he is on appropriate medical therapy, he is also on dual antiplatelet therapy.  Diabetes needs to be controlled better  Scheduled Remote ICD check 10/22/2019:  There were 0 atrial high rate episodes detected. There were 0 VF episodes, 0 FVT episodes and 2 nonsustained episode of ventricular tachycardia (1 Sec duration - 4 beats). Health trends (patient activity, heart rate variability, average heart rates) are stable. Trans-thoracic impedance trends and the Optivol Fluid Index do no present significant abnormalities. Patient activity <1 hour per day.  Battery longevity is 2.7 years. RA pacing is 36.8 %, RV pacing is 98.5 %, and LV pacing is 98.5 %  EKG: 10/17/2019: AV paced rhythm at rate of 75 bpm. No further analysis.    Assessment     ICD-10-CM   1. Coronary artery disease involving autologous artery coronary bypass graft without angina pectoris  I25.810 carvedilol (COREG) 12.5 MG tablet    sacubitril-valsartan (ENTRESTO) 97-103 mg per tablet    Basic metabolic panel    Basic metabolic panel  2. Ischemic cardiomyopathy  I25.5 carvedilol (COREG) 12.5 MG tablet    sacubitril-valsartan (ENTRESTO) 97-103  mg per tablet    Basic metabolic panel    Basic metabolic panel  3. Chronic combined systolic and diastolic heart failure (HCC)  I50.42 carvedilol (COREG) 12.5 MG tablet    sacubitril-valsartan (ENTRESTO) 97-103 mg per tablet    Basic metabolic panel    Basic metabolic panel  4. Essential hypertension  I10 carvedilol (COREG) 12.5 MG tablet    sacubitril-valsartan (ENTRESTO) 97-103 mg per tablet  5. ICD; Biventricular  Medtronic ICD Amplia MRI QWuad CRT-D  in situ 10/29/14  Z95.810     Meds ordered this encounter  Medications  . carvedilol (COREG) 12.5 MG tablet    Sig: Take 1 tablet (12.5 mg total) by mouth 2 (two) times daily.    Dispense:  180 tablet    Refill:  3  . sacubitril-valsartan (ENTRESTO) 97-103 mg per tablet    Order Specific Question:   ACE-inhibitors have NOT been administered in the past 36-hours.    Answer:   YES (confirmed by ordering provider)   Medications Discontinued During This Encounter  Medication Reason  . carvedilol (COREG) 6.25 MG tablet Change in therapy  . sacubitril-valsartan (ENTRESTO) 49-51 mg  per tablet     Recommendations:   Kelvon Giannini  is a 64 y.o. Caucasian male with CAD and history of CABG x2 in 2002  with only LIMA to LAD being patent, Circumflex coronary artery is super dominant. History of stenting to circumflex/OM bifurcation due to recurrent restenosis in 2015, left main and circumflex proximal and midsegment in 2016, angioplasty to left main and proximal and mid circumflex in 2020 and again 10/08/2019.  He also has a history of VT with ICD, ischemic cardiomyopathy, chronic systolic and diastolic heart failure, uncontrolled diabetes mellitus, hypertension, stage III chronic kidney disease, obstructive sleep apnea on CPAP. He was recently admitted to the hospital on 10/08/2019 with acute pulmonary edema and non-ST elevation MI for which she underwent cardiac catheterization with angioplasty to left main and left circumflex coronary arteries.   Patient presents for 2-week follow-up and medication titration.  There are currently no clinical signs of heart failure and he denies symptoms of angina or shortness of breath.  He is presently tolerating Entresto 49/51 mg twice daily and carvedilol 6.25 mg twice daily.  Reviewed labs, renal function remains stable.  Blood pressure mildly elevated in office today and heart rate at 69 bpm.  I believe patient will tolerate again increasing both Entresto and carvedilol.  Will increase Entresto to 97/103 mg twice daily and increase carvedilol to 12.5 mg twice daily.  Encourage patient to continue to stay well-hydrated and to notify the office if he experiences symptoms concerning for bradycardia or hypotension.  Will obtain basic metabolic panel in 1 week.   Again discussed with patient the importance and benefit of cardiac rehab.  He states he has been contacted, but has not yet scheduled an appointment.  Encouraged him to follow-up with cardiac rehab and start the program.  Discussed with patient results of recent remote ICD check on 10/22/2019.  Follow-up 2 weeks for heart failure and medication titration.    Alethia Berthold, PA-C 10/31/2019, 1:46 PM Office: [phone]

## 2019-10-31 ENCOUNTER — Ambulatory Visit: Payer: No Typology Code available for payment source | Admitting: Student

## 2019-10-31 ENCOUNTER — Encounter: Payer: Self-pay | Admitting: Student

## 2019-10-31 ENCOUNTER — Other Ambulatory Visit: Payer: Self-pay

## 2019-10-31 VITALS — BP 135/77 | HR 69 | Ht 67.0 in | Wt 220.0 lb

## 2019-10-31 DIAGNOSIS — I25810 Atherosclerosis of coronary artery bypass graft(s) without angina pectoris: Secondary | ICD-10-CM

## 2019-10-31 DIAGNOSIS — I255 Ischemic cardiomyopathy: Secondary | ICD-10-CM

## 2019-10-31 DIAGNOSIS — Z95810 Presence of automatic (implantable) cardiac defibrillator: Secondary | ICD-10-CM

## 2019-10-31 DIAGNOSIS — I5042 Chronic combined systolic (congestive) and diastolic (congestive) heart failure: Secondary | ICD-10-CM

## 2019-10-31 DIAGNOSIS — I10 Essential (primary) hypertension: Secondary | ICD-10-CM

## 2019-10-31 MED ORDER — SACUBITRIL-VALSARTAN 97-103 MG PO TABS: 1.0000 | ORAL_TABLET | Freq: Two times a day (BID) | ORAL | Status: DC

## 2019-10-31 MED ORDER — CARVEDILOL 12.5 MG PO TABS: 12.5000 mg | ORAL_TABLET | Freq: Two times a day (BID) | ORAL | 3 refills | Status: DC

## 2019-10-31 NOTE — Patient Instructions (Signed)
Blood work at [place] in 1 week, follow up in 2 weeks.

## 2019-11-07 DIAGNOSIS — I5043 Acute on chronic combined systolic (congestive) and diastolic (congestive) heart failure: Secondary | ICD-10-CM | POA: Diagnosis not present

## 2019-11-14 DIAGNOSIS — I5042 Chronic combined systolic (congestive) and diastolic (congestive) heart failure: Secondary | ICD-10-CM | POA: Diagnosis not present

## 2019-11-14 DIAGNOSIS — I25810 Atherosclerosis of coronary artery bypass graft(s) without angina pectoris: Secondary | ICD-10-CM | POA: Diagnosis not present

## 2019-11-14 DIAGNOSIS — I255 Ischemic cardiomyopathy: Secondary | ICD-10-CM | POA: Diagnosis not present

## 2019-11-15 LAB — BASIC METABOLIC PANEL
BUN/Creatinine Ratio: 17 (ref 10–24)
BUN: 24 mg/dL (ref 8–27)
CO2: 21 mmol/L (ref 20–29)
Calcium: 9.3 mg/dL (ref 8.6–10.2)
Chloride: 102 mmol/L (ref 96–106)
Creatinine, Ser: 1.43 mg/dL — ABNORMAL HIGH (ref 0.76–1.27)
GFR calc Af Amer: 60 mL/min/{1.73_m2} (ref >59)
GFR calc non Af Amer: 52 mL/min/{1.73_m2} — ABNORMAL LOW (ref >59)
Glucose: 362 mg/dL — ABNORMAL HIGH (ref 65–99)
Potassium: 4.8 mmol/L (ref 3.5–5.2)
Sodium: 138 mmol/L (ref 134–144)

## 2019-11-15 NOTE — Progress Notes (Signed)
Please inform patient: Kidney function remains stable, but please drink plenty of water to stay well hydrated.

## 2019-11-16 ENCOUNTER — Telehealth: Payer: Self-pay | Admitting: Student

## 2019-11-16 NOTE — Telephone Encounter (Signed)
Attempted to call patient regarding lab results. Patient not available.

## 2019-11-16 NOTE — Progress Notes (Signed)
Called patient, NA, LMAM

## 2019-11-16 NOTE — Progress Notes (Signed)
I called patient back, and discussed the results with him. Patient advised to drink plenty of water.

## 2019-11-22 ENCOUNTER — Other Ambulatory Visit: Payer: Self-pay

## 2019-11-22 ENCOUNTER — Encounter: Payer: Self-pay | Admitting: Cardiology

## 2019-11-22 ENCOUNTER — Ambulatory Visit: Payer: No Typology Code available for payment source | Admitting: Cardiology

## 2019-11-22 VITALS — BP 140/89 | HR 85 | Resp 16 | Ht 67.0 in | Wt 225.0 lb

## 2019-11-22 DIAGNOSIS — Z4502 Encounter for adjustment and management of automatic implantable cardiac defibrillator: Secondary | ICD-10-CM

## 2019-11-22 DIAGNOSIS — I2510 Atherosclerotic heart disease of native coronary artery without angina pectoris: Secondary | ICD-10-CM

## 2019-11-22 DIAGNOSIS — I5042 Chronic combined systolic (congestive) and diastolic (congestive) heart failure: Secondary | ICD-10-CM

## 2019-11-22 DIAGNOSIS — Z95810 Presence of automatic (implantable) cardiac defibrillator: Secondary | ICD-10-CM

## 2019-11-22 DIAGNOSIS — N1832 Chronic kidney disease, stage 3b: Secondary | ICD-10-CM

## 2019-11-22 MED ORDER — SACUBITRIL-VALSARTAN 97-103 MG PO TABS: 1.0000 | ORAL_TABLET | Freq: Two times a day (BID) | ORAL | Status: DC

## 2019-11-22 NOTE — Progress Notes (Signed)
Primary Physician/Referring:  [name], MD  Patient ID: Martin Bush, male    DOB: 12-24-1955, 64 y.o.   MRN: [id number]  Chief Complaint  Patient presents with  . Congestive Heart Failure  . Pacemaker Check  . Follow-up    1 year   HPI:    Martin Bush  is a 64 y.o. Caucasian male with CAD and history of CABG x2 in 2002  with only LIMA to LAD being patent, Circumflex coronary artery is super dominant. History of stenting to circumflex/OM bifurcation due to recurrent restenosis in 2015, left main and circumflex proximal and midsegment in 2016, angioplasty to left main and proximal and mid circumflex in 2020 and again 10/08/2019.  He also has a history of VT with ICD, ischemic cardiomyopathy, chronic systolic and diastolic heart failure.  He was admitted to the hospital on 10/08/2019 with acute pulmonary edema and non-ST elevation MI for which she underwent repeat angioplasty and stenting to left main and left circumflex coronary arteries.  Patient presents for 2 week follow up. At last visit increased Entresto to 49/51mg   and increased carvedilol from 6.25 mg to 12.5 mg twice daily.  He is presently doing well and tolerating medication increases from last visit.  Denies chest pain, shortness of breath PND, orthopnea.  He is tolerating dual antiplatelet therapy without bleeding diathesis.  He continues to slowly increase his daily activity without issue.    Past Medical History:  Diagnosis Date  . AICD (automatic cardioverter/defibrillator) present    Medtronic  . Arthritis    "thumbs"  (08/02/2017)  . CHF (congestive heart failure) ([place])   . Chronic combined systolic and diastolic heart failure ([place])    a. 08/2017 Echo: EF 20-25%, mod glob HK. Sev distal ant sept, inflat HK. Apical AK. Gr2 DD. Mildly reduced RV fxn.  . Colon polyps   . Coronary artery disease    a. s/p CABG x 2 (LIMA->LAD, VG->OM); b. Multiple PCI's to LM/LCX/OM; c. 07/2017 PTCA of LM/LCX w/ early ISR-->repeat  PCI/DES to LM (3.5x20 Synergy DES) and LCX (3.0x20 Synergy DES); d. 07/2018 Relook Cath: LM patent stent, LAD 100ost, LCX patent stent, OM1 99/60, LIMA->LAD ok. VG->dLCX 24 (old).  . Depression   . Encounter for assessment of implantable cardioverter-defibrillator (ICD) 09/27/2018  . High cholesterol   . Hypertension   . ICD; Biventricular  Medtronic ICD Amplia MRI QWuad CRT-D  in situ 10/29/14 10/29/2014   Remote ICD check 09.23.20:  One 6 beat NSVT. No therapy.  1 SVT episode @ 130 bpm (38 Sec).  There were 23 Vent sense episodes detected for up to 1.1 min/day (AT). Health trends (patient activity, heart rate variability, average heart rates) are stable.Trans-thoracic impedance trends and the OptiVol Fluid Index do no present significant abnormalities. Battery longevity is 4.3 years. RA pacing is 3  . Ischemic cardiomyopathy 09/27/2018  . MI (myocardial infarction) ([place]) 2003  . NSTEMI (non-ST elevated myocardial infarction) ([place]) 07/16/2014  . OSA on CPAP   . Oxygen deficiency   . Pneumonia 10/2014  . Proteinuria   . Sleep apnea   . Type II diabetes mellitus (HCC)    insulin dependent   Past Surgical History:  Procedure Laterality Date  . BIOPSY  09/19/2018   Procedure: BIOPSY;  Surgeon: Thornton Park, MD;  Location: [facility];  Service: Gastroenterology;;  . CARDIAC CATHETERIZATION N/A 07/16/2014   Procedure: Left Heart Cath and Coronary Angiography;  Surgeon: Adrian Prows, MD;  Location: [place] [facility];  Service: Cardiovascular;  Laterality: N/A;  . CARDIAC CATHETERIZATION  07/16/2014   Procedure: Coronary Balloon Angioplasty;  Surgeon: Adrian Prows, MD;  Location: [place] [facility];  Service: Cardiovascular;;  . CARDIAC CATHETERIZATION  2003  . CARDIAC DEFIBRILLATOR PLACEMENT  2016  . CATARACT EXTRACTION W/ INTRAOCULAR LENS IMPLANT Left 03/2014  . COLONOSCOPY    . COLONOSCOPY WITH PROPOFOL N/A 09/19/2018   Procedure: COLONOSCOPY WITH PROPOFOL;  Surgeon: Thornton Park, MD;   Location: [facility];  Service: Gastroenterology;  Laterality: N/A;  . CORONARY ANGIOPLASTY WITH STENT PLACEMENT     "I've got a total of 8 stents in there; mostly doine at [facility]" (08/02/2017)  . CORONARY ARTERY BYPASS GRAFT  ~ 2003   "CABG X2"; [facility]  . CORONARY ATHERECTOMY N/A 08/16/2017   Procedure: CORONARY ATHERECTOMY;  Surgeon: Nigel Mormon, MD;  Location: [place] [facility];  Service: Cardiovascular;  Laterality: N/A;  . CORONARY BALLOON ANGIOPLASTY N/A 08/04/2017   Procedure: CORONARY BALLOON ANGIOPLASTY;  Surgeon: Adrian Prows, MD;  Location: [place] [facility];  Service: Cardiovascular;  Laterality: N/A;  . CORONARY BALLOON ANGIOPLASTY N/A 01/11/2019   Procedure: CORONARY BALLOON ANGIOPLASTY;  Surgeon: Adrian Prows, MD;  Location: [place] [facility];  Service: Cardiovascular;  Laterality: N/A;  . CORONARY BALLOON ANGIOPLASTY N/A 10/09/2019   Procedure: CORONARY BALLOON ANGIOPLASTY;  Surgeon: Adrian Prows, MD;  Location: [place] [facility];  Service: Cardiovascular;  Laterality: N/A;  . CORONARY STENT INTERVENTION N/A 08/16/2017   Procedure: CORONARY STENT INTERVENTION;  Surgeon: Nigel Mormon, MD;  Location: [place] [facility];  Service: Cardiovascular;  Laterality: N/A;  . CORONARY STENT INTERVENTION N/A 10/09/2019   Procedure: CORONARY STENT INTERVENTION;  Surgeon: Adrian Prows, MD;  Location: [place] [facility];  Service: Cardiovascular;  Laterality: N/A;  . ELBOW SURGERY Left ?2001   "pinched nerve"  . ENDOSCOPIC MUCOSAL RESECTION  12/18/2018   Procedure: ENDOSCOPIC MUCOSAL RESECTION;  Surgeon: Rush Landmark Telford Nab., MD;  Location: [place];  Service: Gastroenterology;;  . Otho Darner SIGMOIDOSCOPY N/A 12/18/2018   Procedure: Beryle Quant;  Surgeon: Irving Copas., MD;  Location: [place];  Service: Gastroenterology;  Laterality: N/A;  . HEMOSTASIS CLIP PLACEMENT  12/18/2018   Procedure: HEMOSTASIS CLIP PLACEMENT;  Surgeon: Irving Copas., MD;  Location: [place];  Service: Gastroenterology;;  . LEFT HEART CATH AND CORONARY ANGIOGRAPHY N/A 01/11/2019   Procedure: LEFT HEART CATH AND CORONARY ANGIOGRAPHY;  Surgeon: Adrian Prows, MD;  Location: [place] [facility];  Service: Cardiovascular;  Laterality: N/A;  . LEFT HEART CATH AND CORS/GRAFTS ANGIOGRAPHY N/A 08/04/2017   Procedure: LEFT HEART CATH AND CORS/GRAFTS ANGIOGRAPHY;  Surgeon: Adrian Prows, MD;  Location: [place] [facility];  Service: Cardiovascular;  Laterality: N/A;  . LEFT HEART CATH AND CORS/GRAFTS ANGIOGRAPHY N/A 08/20/2017   Procedure: LEFT HEART CATH AND CORS/GRAFTS ANGIOGRAPHY;  Surgeon: Nigel Mormon, MD;  Location: [place] [facility];  Service: Cardiovascular;  Laterality: N/A;  . LEFT HEART CATH AND CORS/GRAFTS ANGIOGRAPHY N/A 01/08/2019   Procedure: LEFT HEART CATH AND CORS/GRAFTS ANGIOGRAPHY;  Surgeon: Wellington Hampshire, MD;  Location: [place] [facility];  Service: Cardiovascular;  Laterality: N/A;  . LEFT HEART CATH AND CORS/GRAFTS ANGIOGRAPHY N/A 10/09/2019   Procedure: LEFT HEART CATH AND CORS/GRAFTS ANGIOGRAPHY;  Surgeon: Adrian Prows, MD;  Location: [place] [facility];  Service: Cardiovascular;  Laterality: N/A;  . POLYPECTOMY  09/19/2018   Procedure: POLYPECTOMY;  Surgeon: Thornton Park, MD;  [facility];  Service: Gastroenterology;;  . Lia Foyer INJECTION  09/19/2018   Procedure: SUBMUCOSAL INJECTION;  Surgeon: Thornton Park, MD;  Location: [facility];  Service: Gastroenterology;;  . Lia Foyer LIFTING INJECTION  12/18/2018   Procedure: SUBMUCOSAL LIFTING INJECTION;  Surgeon: Irving Copas., MD;  Location: [facility];  Service: Gastroenterology;;    Family History  Problem Relation Age of Onset  . Uterine cancer Mother   . Lung cancer Mother   . Hyperlipidemia Father   . Heart disease Father   . Hypertension Father   . Diabetes Father   . Colon cancer Neg Hx   . Esophageal cancer Neg Hx   . Colon  polyps Neg Hx   . Rectal cancer Neg Hx   . Stomach cancer Neg Hx   . Inflammatory bowel disease Neg Hx   . Liver disease Neg Hx   . Pancreatic cancer Neg Hx    Social History   Tobacco Use  . Smoking status: Former Smoker    Packs/day: 0.25    Years: 27.00    Pack years: 6.75    Types: Cigars    Quit date: 2002    Years since quitting: 19.8  . Smokeless tobacco: Former Systems developer    Quit date: 2002  Substance Use Topics  . Alcohol use: Not Currently    Marital Status: Married ROS  Review of Systems  Cardiovascular: Positive for dyspnea on exertion (improved) and leg swelling. Negative for chest pain, claudication, orthopnea, palpitations, paroxysmal nocturnal dyspnea and syncope.  Respiratory: Negative for shortness of breath.   Hematologic/Lymphatic: Does not bruise/bleed easily.  Gastrointestinal: Negative for melena.  Neurological: Negative for dizziness.   Objective  Blood pressure 140/89, pulse 85, resp. rate 16, height 5\' 7"  (1.702 m), weight 225 lb (102.1 kg), SpO2 94 %.  Vitals with BMI 11/22/2019 10/31/2019 10/17/2019  Height 5\' 7"  5\' 7"  5\' 7"   Weight 225 lbs 220 lbs 219 lbs 13 oz  BMI 35.23 85.46 27.03  Systolic 500 938 182  Diastolic 89 77 77  Pulse 85 69 80     Physical Exam Vitals reviewed.  Constitutional:      Comments: He is moderately built and mildly obese in no acute distress.  Cardiovascular:     Rate and Rhythm: Normal rate and regular rhythm.     Pulses:          Carotid pulses are 1+ on the right side with bruit and 1+ on the left side with bruit.      Femoral pulses are 2+ on the right side and 2+ on the left side.      Popliteal pulses are 1+ on the right side and 1+ on the left side.       Dorsalis pedis pulses are 0 on the right side and 0 on the left side.       Posterior tibial pulses are 1+ on the right side and 0 on the left side.     Heart sounds: Normal heart sounds.     Comments: No JVD.  1+ pitting edema below knee bilateral   Pulmonary:     Effort: Pulmonary effort is normal. No accessory muscle usage or respiratory distress.     Breath sounds: Normal breath sounds.    Laboratory examination:   Recent Labs    10/09/19 0558 10/23/19 1347 11/14/19 1514  NA 134* 136 138  K 4.2 4.6 4.8  CL 100 100 102  CO2 26 20 21   GLUCOSE 488* 356* 362*  BUN  25* 28* 24  CREATININE 1.63* 1.32* 1.43*  CALCIUM 8.5* 9.4 9.3  GFRNONAA 44* 57* 52*  GFRAA 51* 66 60   estimated creatinine clearance is 60.2 mL/min (A) (by C-G formula based on SCr of 1.43 mg/dL (H)).  CMP Latest Ref Rng & Units 11/14/2019 10/23/2019 10/09/2019  Glucose 65 - 99 mg/dL 362(H) 356(H) 488(H)  BUN 8 - 27 mg/dL 24 28(H) 25(H)  Creatinine 0.76 - 1.27 mg/dL 1.43(H) 1.32(H) 1.63(H)  Sodium 134 - 144 mmol/L 138 136 134(L)  Potassium 3.5 - 5.2 mmol/L 4.8 4.6 4.2  Chloride 96 - 106 mmol/L 102 100 100  CO2 20 - 29 mmol/L 21 20 26   Calcium 8.6 - 10.2 mg/dL 9.3 9.4 8.5(L)  Total Protein 6.5 - 8.1 g/dL - - 6.2(L)  Total Bilirubin 0.3 - 1.2 mg/dL - - 0.7  Alkaline Phos 38 - 126 U/L - - 118  AST 15 - 41 U/L - - 17  ALT 0 - 44 U/L - - 15   CBC Latest Ref Rng & Units 10/09/2019 10/09/2019 01/11/2019  WBC 4.0 - 10.5 K/uL 10.6(H) 17.0(H) 7.9  Hemoglobin 13.0 - 17.0 g/dL 11.7(L) 13.9 11.3(L)  Hematocrit 39 - 52 % 37.1(L) 44.1 33.2(L)  Platelets 150 - 400 K/uL 177 266 181   Lipid Panel Recent Labs    12/27/18 0814 01/04/19 0640 04/18/19 1019  CHOL 103 142 127  TRIG 182.0* 148 181*  LDLCALC 34 67 58  VLDL 36.4 30  --   HDL 32.10* 45 39*  CHOLHDL 3 3.2  --     HEMOGLOBIN A1C Lab Results  Component Value Date   HGBA1C 11.7 (H) 10/09/2019   MPG 289.09 10/09/2019   TSH Recent Labs    04/18/19 1019 10/09/19 0559  TSH 1.460 1.422   BNP    Component Value Date/Time   BNP 2,026.8 (H) 10/09/2019 0005   Medications and allergies   Allergies  Allergen Reactions  . Diltiazem Rash    Current Outpatient Medications on File Prior to Visit   Medication Sig Dispense Refill  . amiodarone (PACERONE) 200 MG tablet Take 0.5 tablets (100 mg total) by mouth daily.    [name] Kitchen aspirin EC 81 MG EC tablet Take 1 tablet (81 mg total) by mouth daily.    . carvedilol (COREG) 12.5 MG tablet Take 1 tablet (12.5 mg total) by mouth 2 (two) times daily. 180 tablet 3  . Cholecalciferol (VITAMIN D3) 50 MCG (2000 UT) TABS Take 2,000 Units by mouth 2 (two) times daily.     . Cyanocobalamin 2500 MCG CHEW Chew 1 tablet by mouth daily.    . insulin aspart protamine- aspart (NOVOLOG MIX 70/30) (70-30) 100 UNIT/ML injection Inject 0.66 mLs (66 Units total) into the skin daily with breakfast AND 0.6 mLs (60 Units total) daily with supper. (Patient taking differently: Inject 0.6 mLs (60 Units total) into the skin daily with breakfast AND 0.6 mLs (60 Units total) daily with supper.) 10 mL 11  . isosorbide mononitrate (IMDUR) 30 MG 24 hr tablet Take 1 tablet (30 mg total) by mouth daily.    [name] Kitchen liraglutide (VICTOZA) 18 MG/3ML SOPN Inject 1.8 mg into the skin daily.    . metFORMIN (GLUCOPHAGE) 500 MG tablet Take 2 tablets (1,000 mg total) by mouth 2 (two) times daily with a meal. Taking 2 tabs twice a day (Patient taking differently: Take 1,000 mg by mouth 2 (two) times daily with a meal. )    . nitroGLYCERIN (NITROSTAT) 0.4 MG SL  tablet Place 1 tablet (0.4 mg total) under the tongue every 5 (five) minutes x 3 doses as needed for chest pain.  12  . Omega-3 Fatty Acids (FISH OIL) 1000 MG CAPS Take 1,000 mg by mouth 2 (two) times daily.     . potassium chloride SA (KLOR-CON) 20 MEQ tablet TAKE 1 TABLET BY MOUTH TWICE A DAY (Patient taking differently: Take 20 mEq by mouth 2 (two) times daily. ) 60 tablet 1  . PRESCRIPTION MEDICATION Inhale into the lungs at bedtime. CPAP    . rosuvastatin (CRESTOR) 40 MG tablet Take 1 tablet (40 mg total) by mouth at bedtime.    . sertraline (ZOLOFT) 50 MG tablet Take 1 tablet (50 mg total) by mouth at bedtime. (Patient taking differently: Take  100 mg by mouth at bedtime. )    . ticagrelor (BRILINTA) 90 MG TABS tablet Take 1 tablet (90 mg total) by mouth 2 (two) times daily. 60 tablet   . torsemide (DEMADEX) 20 MG tablet TAKE 1 TABLET BY MOUTH TWICE A DAY 60 tablet 0   No current facility-administered medications on file prior to visit.    Radiology:   Chest X-Ray 10/09/2019:  FINDINGS: Unchanged cardiomegaly. Worsened bilateral interstitial opacities. Remote median sternotomy with unchanged position of left chest wall 3 lead AICD. IMPRESSION: Cardiomegaly and worsened bilateral interstitial opacities likely indicating pulmonary edema.  Cardiac Studies:   Renal artery duplex 09/02/2017: Right: Abnormal right Resistive Index. No evidence of right renal  artery stenosis. Left: Normal left Resistive Index. No evidence of left renal artery stenosis. Mesenteric: Normal Celiac artery findings. 70 to 99% stenosis in the superior mesenteric artery.  Left Heart Catheterization12/14/2020:  1. Severe underlying three-vessel coronary artery disease with patent LIMA to LAD. Chronically occluded SVG to OM. Severe in-stent restenosis in left main stent extending into the proximal left circumflex. In addition, there is significant restenosis in the stent placed in the left posterior AV groove artery. The RCA is known to be small in size and severely diseased. 2. Left ventricular angiography was not performed due to chronic kidney disease. LVEDP was 13 mmHg.  Coronary angioplasty of the left main and mid circumflex coronary artery 01/11/2019: Successful Wolverine cutting balloon angioplasty of the left main, 3.5 x 10 mm balloon utilized and high-pressure 12 atmospheric pressure inflations performed throughout the in-stent restenotic left main and proximal ostial circumflex and also mid circumflex coronary artery, 99% reduced to 0% with TIMI II to TIMI-3 flow. 60 mill contrast utilized.  Echocardiogram 06/15/2019:  Left ventricle cavity  is moderately dilated. Moderate concentric  hypertrophy of the left ventricle. Doppler evidence of grade III  (restrictive) diastolic dysfunction, elevated LAP. Left ventricle regional  wall motion findings: Basal inferolateral akinesis. 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 Mild global hypokinesis. Abnormal septal wall motion due  to post-operative coronary artery bypass graft. Moderately depressed LV  systolic function with visual EF 35-40%.  Left atrial cavity is mildly dilated at 4.3 cm.  Right ventricle cavity is normal in size. Normal right ventricular  function. Pacemaker lead/ICD lead noted in the RV.  Trileaflet aortic valve. Mild (Grade I) aortic regurgitation.  Compared to 01/04/2019, EF improved from 25-30%  Left Heart Catheterization 10/09/2019:  LV: Normal LVEDP.  There was no pressure gradient across the aortic valve.  Angiogram not performed to conserve contrast. Right coronary artery nondominant and severely diffusely diseased unchanged from prior cardiac catheterization. Left main: Mid to distal left main has in-stent restenosis of 99% and extends into  the dominant circumflex which also has proximal in-stent 99% stenosis with TIMI II flow.  Successful scoring balloon angioplasty with 3.5 x 15 mm Wolverine at high 14 atmospheric pressure, due to recall, haziness, stented with 4.0 x 18 mm resolute Onyx DES at 18 atmospheric pressure for 60 seconds, stenosis reduced in the left main and proximal in-stent restenosis from 99% to 0% with improvement in TIMI flow from 2-3. Successful PTCA and Cutting Balloon angioplasty with Wolverine 3.0 x 10 mm balloon in the mid segment of the dominant circumflex coronary artery in-stent restenosis.  Stenosis reduced to 0% at 14 atmospheric pressure inflations x2.  TIMI-3 to TIMI-3 flow maintained. LAD: Is occluded in the proximal segment.  Distal LAD supplied by LIMA.  LIMA to LAD is widely patent.  Device check: Medtronic CRTD Amplia MRI QWuad CRT-D  10/29/14   Scheduled Remote ICD check 10/22/2019:  There were 0 atrial high rate episodes detected. There were 0 VF episodes, 0 FVT episodes and 2 nonsustained episode of ventricular tachycardia (1 Sec duration - 4 beats). Health trends (patient activity, heart rate variability, average heart rates) are stable. Trans-thoracic impedance trends and the Optivol Fluid Index do no present significant abnormalities. Patient activity <1 hour per day.  Battery longevity is 2.7 years. RA pacing is 36.8 %, RV pacing is 98.5 %, and LV pacing is 98.5 %.  Unscheduled Remote ICD check patient activated 11/22/2019:  Patient activated event, accidental.  Possible fluid overload, impedance back to baseline.  A pace 49%, BiV pace 99%.  Scheduled  In office ICD 11/22/19  Single (S)/Dual (D)/BV (M) BV Presenting APBP Pacer dependant: No. Underlying NSR @ 55/min. AP 48%, BP 98.4% AMS Episodes 1.  SVT 11 S. HVR 0 since prior record 11/22/2019.   Longevity 2.7 Years/Voltage. Charge time 9.8Sec. Lead measurements: Stable Histogram: Low (L)/normal (N)/high (H)  Normal Patient activity Low. Thoracic impedance: Fluid accumulation last one month now back to baseline  Observations: Normal BV ICD.  Changes: Changed AP rate to 50 from 60 for preserving battery. Patient is programmed to [place] pace 100%   EKG:    10/17/2019: AV paced rhythm at rate of 75 bpm. No further analysis.   Assessment     ICD-10-CM   1. Encounter for assessment of implantable cardioverter-defibrillator (ICD)  Z45.02   2. ICD; Biventricular  Medtronic ICD Amplia MRI QWuad CRT-D  in situ 10/29/14  Z95.810   3. Chronic combined systolic and diastolic heart failure (HCC)  I50.42 sacubitril-valsartan (ENTRESTO) 97-103 MG    Basic metabolic panel    Brain natriuretic peptide    PCV ECHOCARDIOGRAM COMPLETE  4. Coronary artery disease involving native coronary artery of native heart without angina pectoris  I25.10   5. Stage 3b chronic kidney disease  (HCC)  X83.38 Basic metabolic panel    Meds ordered this encounter  Medications  . sacubitril-valsartan (ENTRESTO) 97-103 MG    Sig: Take 1 tablet by mouth 2 (two) times daily.    Dispense:  60 tablet   Medications Discontinued During This Encounter  Medication Reason  . sacubitril-valsartan (ENTRESTO) 97-103 mg per tablet Entry Error  . sacubitril-valsartan (ENTRESTO) 97-103 MG Reorder   Recommendations:   Martin Bush  is a 64 y.o. Caucasian male with CAD and history of CABG x2 in 2002  with only LIMA to LAD being patent, Circumflex coronary artery is super dominant. History of stenting to circumflex/OM bifurcation due to recurrent restenosis in 2015, left main and circumflex proximal and midsegment in 2016,  angioplasty to left main and proximal and mid circumflex in 2020 and again 10/08/2019.  He also has a history of VT with ICD, ischemic cardiomyopathy, chronic systolic and diastolic heart failure, uncontrolled diabetes mellitus, hypertension, stage III chronic kidney disease, obstructive sleep apnea on CPAP. He was recently admitted to the hospital on 10/08/2019 with acute pulmonary edema and non-ST elevation MI for which she underwent cardiac catheterization with angioplasty to left main and left circumflex coronary arteries.  Patient presents for 2-week follow-up and medication titration.  There are currently no clinical signs of heart failure and he denies symptoms of angina or shortness of breath.  He is presently tolerating Entresto 49/51 mg twice daily and carvedilol 12.5 mg twice daily.  Reviewed labs, renal function remains stable.  Blood pressure mildly elevated. Will increase Entresto to 97/103 mg twice daily.  I will obtain BMP in 2 weeks in view of underlying stage III chronic kidney disease.  He was only taking Entresto 97/103 mg 1/2 tablet twice daily.  I will repeat echocardiogram to reevaluate his LV systolic function since his left main is no stented.  I also discussed with  him regarding ICD data and presence of heart failure previously which is back to baseline, weight loss again discussed extensively.  I reviewed the data of the ICD, I have made minor changes to his underlying a pacing rate to increase battery life.  He will continue to BiV pace.  We will continue remote monitoring.  I will see him back in 4 weeks for follow-up.    Adrian Prows, PA-C 11/22/2019, 6:22 PM Office: [phone]

## 2019-11-22 NOTE — Patient Instructions (Signed)
Blood work in 2 weeks.

## 2019-11-25 DIAGNOSIS — I5021 Acute systolic (congestive) heart failure: Secondary | ICD-10-CM | POA: Diagnosis not present

## 2019-11-26 ENCOUNTER — Ambulatory Visit: Payer: No Typology Code available for payment source | Admitting: Cardiology

## 2019-11-28 ENCOUNTER — Other Ambulatory Visit: Payer: Self-pay | Admitting: Cardiology

## 2019-11-30 ENCOUNTER — Ambulatory Visit: Payer: No Typology Code available for payment source | Admitting: Gastroenterology

## 2019-12-05 ENCOUNTER — Ambulatory Visit (INDEPENDENT_AMBULATORY_CARE_PROVIDER_SITE_OTHER): Payer: No Typology Code available for payment source

## 2019-12-05 VITALS — Ht 67.0 in | Wt 225.0 lb

## 2019-12-05 DIAGNOSIS — Z0000 Encounter for general adult medical examination without abnormal findings: Secondary | ICD-10-CM | POA: Diagnosis not present

## 2019-12-05 NOTE — Progress Notes (Signed)
Subjective:   Martin Bush is a 64 y.o. male who presents for Medicare Annual/Subsequent preventive examination.  Review of Systems    No ROS.  Medicare Wellness Virtual Visit.   Cardiac Risk Factors include: advanced age (>92men, >72 women);male gender;hypertension;diabetes mellitus     Objective:    Today's Vitals   12/05/19 1339  Weight: 225 lb (102.1 kg)  Height: 5\' 7"  (1.702 m)   Body mass index is 35.24 kg/m.  Advanced Directives 12/05/2019 02/28/2019 01/15/2019 01/04/2019 01/03/2019 12/18/2018 12/04/2018  Does Patient Have a Medical Advance Directive? No No No No No No No  Would patient like information on creating a medical advance directive? No - Patient declined No - Patient declined Yes (MAU/Ambulatory/Procedural Areas - Information given) No - Patient declined No - Patient declined No - Patient declined No - Patient declined    Current Medications (verified) Outpatient Encounter Medications as of 12/05/2019  Medication Sig  . amiodarone (PACERONE) 200 MG tablet Take 0.5 tablets (100 mg total) by mouth daily.  [name] Kitchen aspirin EC 81 MG EC tablet Take 1 tablet (81 mg total) by mouth daily.  . carvedilol (COREG) 12.5 MG tablet Take 1 tablet (12.5 mg total) by mouth 2 (two) times daily.  . Cholecalciferol (VITAMIN D3) 50 MCG (2000 UT) TABS Take 2,000 Units by mouth 2 (two) times daily.   . Cyanocobalamin 2500 MCG CHEW Chew 1 tablet by mouth daily.  . insulin aspart protamine- aspart (NOVOLOG MIX 70/30) (70-30) 100 UNIT/ML injection Inject 0.66 mLs (66 Units total) into the skin daily with breakfast AND 0.6 mLs (60 Units total) daily with supper. (Patient taking differently: Inject 0.6 mLs (60 Units total) into the skin daily with breakfast AND 0.6 mLs (60 Units total) daily with supper.)  . isosorbide mononitrate (IMDUR) 30 MG 24 hr tablet Take 1 tablet (30 mg total) by mouth daily.  [name] Kitchen liraglutide (VICTOZA) 18 MG/3ML SOPN Inject 1.8 mg into the skin daily.  . metFORMIN  (GLUCOPHAGE) 500 MG tablet Take 2 tablets (1,000 mg total) by mouth 2 (two) times daily with a meal. Taking 2 tabs twice a day (Patient taking differently: Take 1,000 mg by mouth 2 (two) times daily with a meal. )  . nitroGLYCERIN (NITROSTAT) 0.4 MG SL tablet Place 1 tablet (0.4 mg total) under the tongue every 5 (five) minutes x 3 doses as needed for chest pain.  . Omega-3 Fatty Acids (FISH OIL) 1000 MG CAPS Take 1,000 mg by mouth 2 (two) times daily.   . potassium chloride SA (KLOR-CON) 20 MEQ tablet TAKE 1 TABLET BY MOUTH TWICE A DAY  . PRESCRIPTION MEDICATION Inhale into the lungs at bedtime. CPAP  . rosuvastatin (CRESTOR) 40 MG tablet Take 1 tablet (40 mg total) by mouth at bedtime.  . sacubitril-valsartan (ENTRESTO) 97-103 MG Take 1 tablet by mouth 2 (two) times daily. (Patient taking differently: Take 0.5 tablets by mouth 2 (two) times daily. )  . sertraline (ZOLOFT) 50 MG tablet Take 1 tablet (50 mg total) by mouth at bedtime. (Patient taking differently: Take 100 mg by mouth at bedtime. )  . ticagrelor (BRILINTA) 90 MG TABS tablet Take 1 tablet (90 mg total) by mouth 2 (two) times daily.  [name] Kitchen torsemide (DEMADEX) 20 MG tablet TAKE 1 TABLET BY MOUTH TWICE A DAY   No facility-administered encounter medications on file as of 12/05/2019.    Allergies (verified) Diltiazem   History: Past Medical History:  Diagnosis Date  . AICD (automatic cardioverter/defibrillator) present  Medtronic  . Arthritis    "thumbs"  (08/02/2017)  . CHF (congestive heart failure) ([place])   . Chronic combined systolic and diastolic heart failure ([place])    a. 08/2017 Echo: EF 20-25%, mod glob HK. Sev distal ant sept, inflat HK. Apical AK. Gr2 DD. Mildly reduced RV fxn.  . Colon polyps   . Coronary artery disease    a. s/p CABG x 2 (LIMA->LAD, VG->OM); b. Multiple PCI's to LM/LCX/OM; c. 07/2017 PTCA of LM/LCX w/ early ISR-->repeat PCI/DES to LM (3.5x20 Synergy DES) and LCX (3.0x20 Synergy DES); d. 07/2018 Relook Cath:  LM patent stent, LAD 100ost, LCX patent stent, OM1 99/60, LIMA->LAD ok. VG->dLCX 45 (old).  . Depression   . Encounter for assessment of implantable cardioverter-defibrillator (ICD) 09/27/2018  . High cholesterol   . Hypertension   . ICD; Biventricular  Medtronic ICD Amplia MRI QWuad CRT-D  in situ 10/29/14 10/29/2014   Remote ICD check 09.23.20:  One 6 beat NSVT. No therapy.  1 SVT episode @ 130 bpm (38 Sec).  There were 23 Vent sense episodes detected for up to 1.1 min/day (AT). Health trends (patient activity, heart rate variability, average heart rates) are stable.Trans-thoracic impedance trends and the OptiVol Fluid Index do no present significant abnormalities. Battery longevity is 4.3 years. RA pacing is 3  . Ischemic cardiomyopathy 09/27/2018  . MI (myocardial infarction) ([place]) 2003  . NSTEMI (non-ST elevated myocardial infarction) ([place]) 07/16/2014  . OSA on CPAP   . Oxygen deficiency   . Pneumonia 10/2014  . Proteinuria   . Sleep apnea   . Type II diabetes mellitus ([facility])    insulin dependent   Past Surgical History:  Procedure Laterality Date  . BIOPSY  09/19/2018   Procedure: BIOPSY;  Surgeon: Thornton Park, MD;  Location: [facility];  Service: Gastroenterology;;  . CARDIAC CATHETERIZATION N/A 07/16/2014   Procedure: Left Heart Cath and Coronary Angiography;  Surgeon: Adrian Prows, MD;  Location: [place] [facility];  Service: Cardiovascular;  Laterality: N/A;  . CARDIAC CATHETERIZATION  07/16/2014   Procedure: Coronary Balloon Angioplasty;  Surgeon: Adrian Prows, MD;  Location: [place] [facility];  Service: Cardiovascular;;  . CARDIAC CATHETERIZATION  2003  . CARDIAC DEFIBRILLATOR PLACEMENT  2016  . CATARACT EXTRACTION W/ INTRAOCULAR LENS IMPLANT Left 03/2014  . COLONOSCOPY    . COLONOSCOPY WITH PROPOFOL N/A 09/19/2018   Procedure: COLONOSCOPY WITH PROPOFOL;  Surgeon: Thornton Park, MD;  [facility]: [facility];  Service: Gastroenterology;  Laterality: N/A;  . CORONARY ANGIOPLASTY  WITH STENT PLACEMENT     "I've got a total of 8 stents in there; mostly doine at [facility]" (08/02/2017)  . CORONARY ARTERY BYPASS GRAFT  ~ 2003   "CABG X2"; [facility]  . CORONARY ATHERECTOMY N/A 08/16/2017   Procedure: CORONARY ATHERECTOMY;  Surgeon: Nigel Mormon, MD;  Location: [place] [facility];  Service: Cardiovascular;  Laterality: N/A;  . CORONARY BALLOON ANGIOPLASTY N/A 08/04/2017   Procedure: CORONARY BALLOON ANGIOPLASTY;  Surgeon: Adrian Prows, MD;  Location: [place] [facility];  Service: Cardiovascular;  Laterality: N/A;  . CORONARY BALLOON ANGIOPLASTY N/A 01/11/2019   Procedure: CORONARY BALLOON ANGIOPLASTY;  Surgeon: Adrian Prows, MD;  Location: [place] [facility];  Service: Cardiovascular;  Laterality: N/A;  . CORONARY BALLOON ANGIOPLASTY N/A 10/09/2019   Procedure: CORONARY BALLOON ANGIOPLASTY;  Surgeon: Adrian Prows, MD;  Location: [place] [facility];  Service: Cardiovascular;  Laterality: N/A;  . CORONARY STENT INTERVENTION N/A 08/16/2017   Procedure: CORONARY STENT INTERVENTION;  Surgeon: Nigel Mormon, MD;  Location: [place] [facility];  Service: Cardiovascular;  Laterality: N/A;  . CORONARY STENT INTERVENTION N/A 10/09/2019   Procedure: CORONARY STENT INTERVENTION;  Surgeon: Adrian Prows, MD;  Location: [place] [facility];  Service: Cardiovascular;  Laterality: N/A;  . ELBOW SURGERY Left ?2001   "pinched nerve"  . ENDOSCOPIC MUCOSAL RESECTION  12/18/2018   Procedure: ENDOSCOPIC MUCOSAL RESECTION;  Surgeon: Rush Landmark Telford Nab., MD;  Location: [place];  Service: Gastroenterology;;  . Otho Darner SIGMOIDOSCOPY N/A 12/18/2018   Procedure: Beryle Quant;  Surgeon: Irving Copas., MD;  Location: [place];  Service: Gastroenterology;  Laterality: N/A;  . HEMOSTASIS CLIP PLACEMENT  12/18/2018   Procedure: HEMOSTASIS CLIP PLACEMENT;  Surgeon: Irving Copas., MD;  Location: [place];  Service: Gastroenterology;;  . LEFT HEART CATH AND  CORONARY ANGIOGRAPHY N/A 01/11/2019   Procedure: LEFT HEART CATH AND CORONARY ANGIOGRAPHY;  Surgeon: Adrian Prows, MD;  Location: [place] [facility];  Service: Cardiovascular;  Laterality: N/A;  . LEFT HEART CATH AND CORS/GRAFTS ANGIOGRAPHY N/A 08/04/2017   Procedure: LEFT HEART CATH AND CORS/GRAFTS ANGIOGRAPHY;  Surgeon: Adrian Prows, MD;  Location: [place] [facility];  Service: Cardiovascular;  Laterality: N/A;  . LEFT HEART CATH AND CORS/GRAFTS ANGIOGRAPHY N/A 08/20/2017   Procedure: LEFT HEART CATH AND CORS/GRAFTS ANGIOGRAPHY;  Surgeon: Nigel Mormon, MD;  Location: [place] [facility];  Service: Cardiovascular;  Laterality: N/A;  . LEFT HEART CATH AND CORS/GRAFTS ANGIOGRAPHY N/A 01/08/2019   Procedure: LEFT HEART CATH AND CORS/GRAFTS ANGIOGRAPHY;  Surgeon: Wellington Hampshire, MD;  Location: [place] [facility];  Service: Cardiovascular;  Laterality: N/A;  . LEFT HEART CATH AND CORS/GRAFTS ANGIOGRAPHY N/A 10/09/2019   Procedure: LEFT HEART CATH AND CORS/GRAFTS ANGIOGRAPHY;  Surgeon: Adrian Prows, MD;  Location: [place] [facility];  Service: Cardiovascular;  Laterality: N/A;  . POLYPECTOMY  09/19/2018   Procedure: POLYPECTOMY;  Surgeon: Thornton Park, MD;  [facility];  Service: Gastroenterology;;  . Lia Foyer INJECTION  09/19/2018   Procedure: SUBMUCOSAL INJECTION;  Surgeon: Thornton Park, MD;  [facility];  Service: Gastroenterology;;  . Lia Foyer LIFTING INJECTION  12/18/2018   Procedure: SUBMUCOSAL LIFTING INJECTION;  Surgeon: Irving Copas., MD;  Location: [facility];  Service: Gastroenterology;;   Family History  Problem Relation Age of Onset  . Uterine cancer Mother   . Lung cancer Mother   . Hyperlipidemia Father   . Heart disease Father   . Hypertension Father   . Diabetes Father   . Colon cancer Neg Hx   . Esophageal cancer Neg Hx   . Colon polyps Neg Hx   . Rectal cancer Neg Hx   . Stomach cancer Neg Hx   . Inflammatory bowel disease  Neg Hx   . Liver disease Neg Hx   . Pancreatic cancer Neg Hx    Social History   Socioeconomic History  . Marital status: Married    Spouse name: Not on file  . Number of children: 3  . Years of education: Not on file  . Highest education level: Not on file  Occupational History  . Not on file  Tobacco Use  . Smoking status: Former Smoker    Packs/day: 0.25    Years: 27.00    Pack years: 6.75    Types: Cigars    Quit date: 2002    Years since quitting: 19.8  . Smokeless tobacco: Former Systems developer    Quit date: 2002  Vaping Use  .  Vaping Use: Never used  Substance and Sexual Activity  . Alcohol use: Not Currently  . Drug use: Never  . Sexual activity: Not Currently  Other Topics Concern  . Not on file  Social History Narrative  . Not on file   Social Determinants of Health   Financial Resource Strain: Low Risk   . Difficulty of Paying Living Expenses: Not hard at all  Food Insecurity: No Food Insecurity  . Worried About Charity fundraiser in the Last Year: Never true  . Ran Out of Food in the Last Year: Never true  Transportation Needs: No Transportation Needs  . Lack of Transportation (Medical): No  . Lack of Transportation (Non-Medical): No  Physical Activity:   . Days of Exercise per Week: Not on file  . Minutes of Exercise per Session: Not on file  Stress: No Stress Concern Present  . Feeling of Stress : Not at all  Social Connections:   . Frequency of Communication with Friends and Family: Not on file  . Frequency of Social Gatherings with Friends and Family: Not on file  . Attends Religious Services: Not on file  . Active Member of Clubs or Organizations: Not on file  . Attends Archivist Meetings: Not on file  . Marital Status: Not on file    Tobacco Counseling Counseling given: Not Answered   Clinical Intake:  Pre-visit preparation completed: Yes        Diabetes: Yes (Followed by [facility])  How often do you need to have  someone help you when you read instructions, pamphlets, or other written materials from your doctor or pharmacy?: 1 - Never Nutrition Risk Assessment: Has the patient had any N/V/D within the last 2 months?  No  Does the patient have any non-healing wounds?  No  Has the patient had any unintentional weight loss or weight gain?  No   Diabetes: If diabetic, was a CBG obtained today?  No  Did the patient bring in their glucometer from home?  No  How often do you monitor your CBG's? Once a week.   Financial Strains and Diabetes Management: Are you having any financial strains with the device, your supplies or your medication? No .  Does the patient want to be seen by Chronic Care Management for management of their diabetes?  No  Would the patient like to be referred to a Nutritionist or for Diabetic Management?  No   Diabetic Exams: Eye exam- Plans to schedule with [facility].  Foot exam- denies wounds, numbness, tingling and all other symptoms.   Interpreter Needed?: No      Activities of Daily Living In your present state of health, do you have any difficulty performing the following activities: 12/05/2019 03/27/2019  Hearing? N N  Vision? N N  Difficulty concentrating or making decisions? N N  Walking or climbing stairs? N Y  Dressing or bathing? N N  Doing errands, shopping? N N  Preparing Food and eating ? N -  Using the Toilet? N -  In the past six months, have you accidently leaked urine? N -  Do you have problems with loss of bowel control? N -  Managing your Medications? N -  Managing your Finances? Y -  Comment Wife manages -  Housekeeping or managing your Housekeeping? N -  Some recent data might be hidden    Patient Care Team: [name], MD as PCP - General (Family Medicine) Adrian Prows, MD  as Consulting Physician (Cardiology)  Indicate any recent Medical Services you may have received from other than [facility] providers in the past year (date may be  approximate).     Assessment:   This is a routine wellness examination for [name].  I connected with Martin Bush today by telephone and verified that I am speaking with the correct person using two identifiers. Location patient: home Location provider: work Persons participating in the virtual visit: patient, Marine scientist.    I discussed the limitations, risks, security and privacy concerns of performing an evaluation and management service by telephone and the availability of in person appointments. The patient expressed understanding and verbally consented to this telephonic visit.    Interactive audio and video telecommunications were attempted between this provider and patient, however failed, due to patient having technical difficulties OR patient did not have access to video capability.  We continued and completed visit with audio only.  Some vital signs may be absent or patient reported.   Hearing/Vision screen  Hearing Screening   125Hz  250Hz  500Hz  1000Hz  2000Hz  3000Hz  4000Hz  6000Hz  8000Hz   Right ear:           Left ear:           Comments: Patient is able to hear conversational tones without difficulty.  No issues reported.  Vision Screening Comments: Followed [facility]  Wears corrective lenses  Annual visits  Virtual visit.  Plans to schedule appointment.   Dietary issues and exercise activities discussed: Current Exercise Habits: Home exercise routine, Type of exercise: walking, Intensity: Mild  Regular diet   Goals    . Increase physical activity     Stay active, walk for exercise      Depression Screen PHQ 2/9 Scores 12/05/2019 07/02/2019 03/06/2019 01/15/2019 12/04/2018 09/29/2018 08/30/2018  PHQ - 2 Score 0 0 0 0 0 0 0  PHQ- 9 Score - 0 1 - - 0 -    Fall Risk Fall Risk  12/05/2019 06/05/2019 03/27/2019 02/28/2019 01/30/2019  Falls in the past year? 0 0 0 0 (No Data)  Comment - - - - Falls screening previously completed- patient denies new/ recent falls  Number falls in  past yr: 0 0 0 0 -  Comment - No new falls reported - - -  Injury with Fall? - 0 0 0 -  Comment - N/A- no falls reported - - -  Risk for fall due to : - Medication side effect - No Fall Risks -  Risk for fall due to: Comment - - - - -  Follow up Falls evaluation completed Falls prevention discussed Falls evaluation completed - Falls prevention discussed   Handrails in use when climbing stairs? Yes Home free of loose throw rugs in walkways, pet beds, electrical cords, etc? Yes  Adequate lighting in your home to reduce risk of falls? Yes   ASSISTIVE DEVICES UTILIZED TO PREVENT FALLS: Use of a cane, walker or w/c? No   TIMED UP AND GO: Was the test performed? No . Virtual visit.   Cognitive Function: Patient is alert and oriented x3.  Denies difficulty focusing, making decisions, memory loss.  Enjoys reading for brain health.  MMSE/6CIT deferred. Normal by direct communication/observation.   MMSE - Mini Mental State Exam 04/25/2017 03/30/2016  Orientation to time 5 5  Orientation to Place 5 5  Registration 3 3  Attention/ Calculation 5 5  Recall 3 3  Language- name 2 objects 2 2  Language- repeat 1 1  Language- follow 3 step command 3 3  Language- read & follow direction 1 1  Write a sentence 1 1  Copy design 1 1  Total score 30 30     6CIT Screen 12/04/2018  What Year? 0 points  What month? 0 points  What time? 0 points  Count back from 20 0 points  Months in reverse 0 points  Repeat phrase 0 points  Total Score 0    Immunizations Immunization History  Administered Date(s) Administered  . Influenza,inj,Quad PF,6+ Mos 03/15/2016, 09/29/2018  . Influenza-Unspecified 11/26/2014, 09/24/2019  . Moderna SARS-COVID-2 Vaccination 03/27/2019, 04/24/2019  . Pneumococcal-Unspecified 11/14/2013  . Tdap 02/25/2014   Health Maintenance Health Maintenance  Topic Date Due  . PNEUMOCOCCAL POLYSACCHARIDE VACCINE AGE 26-64 HIGH RISK  Never done  . OPHTHALMOLOGY EXAM  04/24/2016  .  HEMOGLOBIN A1C  04/07/2020  . FOOT EXAM  10/09/2020  . URINE MICROALBUMIN  10/09/2020  . TETANUS/TDAP  02/26/2024  . COLONOSCOPY  09/18/2028  . INFLUENZA VACCINE  Completed  . COVID-19 Vaccine  Completed  . Hepatitis C Screening  Completed  . HIV Screening  Completed   Pneumococcal vaccine- notes received last [facility] at [facility]. Agrees to update immunization record.   Colorectal cancer screening: Completed 09/19/18. Repeat every 10 years  Lung Cancer Screening: (Low Dose CT Chest recommended if Age 30-80 years, 30 pack-year currently smoking OR have quit w/in 15years.) does not qualify.   Hepatitis C Screening: Completed 02/27/25.   Dental Screening: Recommended annual dental exams for proper oral hygiene. Dentures.  Community Resource Referral / Chronic Care Management: CRR required this visit?  No   CCM required this visit?  No      Plan:   Keep all routine maintenance appointments.   I have personally reviewed and noted the following in the patient's chart:   . Medical and social history . Use of alcohol, tobacco or illicit drugs  . Current medications and supplements . Functional ability and status . Nutritional status . Physical activity . Advanced directives . List of other physicians . Hospitalizations, surgeries, and ER visits in previous 12 months . Vitals . Screenings to include cognitive, depression, and falls . Referrals and appointments  In addition, I have reviewed and discussed with patient certain preventive protocols, quality metrics, and best practice recommendations. A written personalized care plan for preventive services as well as general preventive health recommendations were provided to patient via mychart.     Varney Biles, LPN   [id number]

## 2019-12-05 NOTE — Patient Instructions (Addendum)
[name] , Thank you for taking time to come for your Medicare Wellness Visit. I appreciate your ongoing commitment to your health goals. Please review the following plan we discussed and let me know if I can assist you in the future.   These are the goals we discussed: Goals    . Increase physical activity     Stay active, walk for exercise       This is a list of the screening recommended for you and due dates:  Health Maintenance  Topic Date Due  . Pneumococcal vaccine  Never done  . Eye exam for diabetics  04/24/2016  . Hemoglobin A1C  04/07/2020  . Complete foot exam   10/09/2020  . Urine Protein Check  10/09/2020  . Tetanus Vaccine  02/26/2024  . Colon Cancer Screening  09/18/2028  . Flu Shot  Completed  . COVID-19 Vaccine  Completed  .  Hepatitis C: One time screening is recommended by Center for Disease Control  (CDC) for  adults born from [birth year] through 1965.   Completed  . HIV Screening  Completed    Immunizations Immunization History  Administered Date(s) Administered  . Influenza,inj,Quad PF,6+ Mos 03/15/2016, 09/29/2018  . Influenza-Unspecified 11/26/2014, 09/24/2019  . Moderna SARS-COVID-2 Vaccination 03/27/2019, 04/24/2019  . Pneumococcal-Unspecified 11/14/2013  . Tdap 02/25/2014   Keep all routine maintenance appointments.   Advanced directives: not yet completed  Conditions/risks identified: none new  Follow up in one year for your annual wellness visit.  Preventive Care 40-64 Years, Male Preventive care refers to lifestyle choices and visits with your health care provider that can promote health and wellness. What does preventive care include?  A yearly physical exam. This is also called an annual well check.  Dental exams once or twice a year.  Routine eye exams. Ask your health care provider how often you should have your eyes checked.  Personal lifestyle choices, including:  Daily care of your teeth and gums.  Regular physical  activity.  Eating a healthy diet.  Avoiding tobacco and drug use.  Limiting alcohol use.  Practicing safe sex.  Taking low-dose aspirin every day starting at age 36. What happens during an annual well check? The services and screenings done by your health care provider during your annual well check will depend on your age, overall health, lifestyle risk factors, and family history of disease. Counseling  Your health care provider may ask you questions about your:  Alcohol use.  Tobacco use.  Drug use.  Emotional well-being.  Home and relationship well-being.  Sexual activity.  Eating habits.  Work and work Statistician. Screening  You may have the following tests or measurements:  Height, weight, and BMI.  Blood pressure.  Lipid and cholesterol levels. These may be checked every 5 years, or more frequently if you are over 31 years old.  Skin check.  Lung cancer screening. You may have this screening every year starting at age 52 if you have a 30-pack-year history of smoking and currently smoke or have quit within the past 15 years.  Fecal occult blood test (FOBT) of the stool. You may have this test every year starting at age 44.  Flexible sigmoidoscopy or colonoscopy. You may have a sigmoidoscopy every 5 years or a colonoscopy every 10 years starting at age 46.  Prostate cancer screening. Recommendations will vary depending on your family history and other risks.  Hepatitis C blood test.  Hepatitis B blood test.  Sexually transmitted disease (STD) testing.  Diabetes screening. This is done by checking your blood sugar (glucose) after you have not eaten for a while (fasting). You may have this done every 1-3 years. Discuss your test results, treatment options, and if necessary, the need for more tests with your health care provider. Vaccines  Your health care provider may recommend certain vaccines, such as:  Influenza vaccine. This is recommended every  year.  Tetanus, diphtheria, and acellular pertussis (Tdap, Td) vaccine. You may need a Td booster every 10 years.  Zoster vaccine. You may need this after age 75.  Pneumococcal 13-valent conjugate (PCV13) vaccine. You may need this if you have certain conditions and have not been vaccinated.  Pneumococcal polysaccharide (PPSV23) vaccine. You may need one or two doses if you smoke cigarettes or if you have certain conditions. Talk to your health care provider about which screenings and vaccines you need and how often you need them. This information is not intended to replace advice given to you by your health care provider. Make sure you discuss any questions you have with your health care provider. Document Released: 02/07/2015 Document Revised: 10/01/2015 Document Reviewed: 11/12/2014 Elsevier Interactive Patient Education  2017 [employer] Prevention in the Home Falls can cause injuries. They can happen to people of all ages. There are many things you can do to make your home safe and to help prevent falls. What can I do on the outside of my home?  Regularly fix the edges of walkways and driveways and fix any cracks.  Remove anything that might make you trip as you walk through a door, such as a raised step or threshold.  Trim any bushes or trees on the path to your home.  Use bright outdoor lighting.  Clear any walking paths of anything that might make someone trip, such as rocks or tools.  Regularly check to see if handrails are loose or broken. Make sure that both sides of any steps have handrails.  Any raised decks and porches should have guardrails on the edges.  Have any leaves, snow, or ice cleared regularly.  Use sand or salt on walking paths during winter.  Clean up any spills in your garage right away. This includes oil or grease spills. What can I do in the bathroom?  Use night lights.  Install grab bars by the toilet and in the tub and shower. Do not  use towel bars as grab bars.  Use non-skid mats or decals in the tub or shower.  If you need to sit down in the shower, use a plastic, non-slip stool.  Keep the floor dry. Clean up any water that spills on the floor as soon as it happens.  Remove soap buildup in the tub or shower regularly.  Attach bath mats securely with double-sided non-slip rug tape.  Do not have throw rugs and other things on the floor that can make you trip. What can I do in the bedroom?  Use night lights.  Make sure that you have a light by your bed that is easy to reach.  Do not use any sheets or blankets that are too big for your bed. They should not hang down onto the floor.  Have a firm chair that has side arms. You can use this for support while you get dressed.  Do not have throw rugs and other things on the floor that can make you trip. What can I do in the kitchen?  Clean up any spills right away.  Avoid walking on wet floors.  Keep items that you use a lot in easy-to-reach places.  If you need to reach something above you, use a strong step stool that has a grab bar.  Keep electrical cords out of the way.  Do not use floor polish or wax that makes floors slippery. If you must use wax, use non-skid floor wax.  Do not have throw rugs and other things on the floor that can make you trip. What can I do with my stairs?  Do not leave any items on the stairs.  Make sure that there are handrails on both sides of the stairs and use them. Fix handrails that are broken or loose. Make sure that handrails are as long as the stairways.  Check any carpeting to make sure that it is firmly attached to the stairs. Fix any carpet that is loose or worn.  Avoid having throw rugs at the top or bottom of the stairs. If you do have throw rugs, attach them to the floor with carpet tape.  Make sure that you have a light switch at the top of the stairs and the bottom of the stairs. If you do not have them, ask  someone to add them for you. What else can I do to help prevent falls?  Wear shoes that:  Do not have high heels.  Have rubber bottoms.  Are comfortable and fit you well.  Are closed at the toe. Do not wear sandals.  If you use a stepladder:  Make sure that it is fully opened. Do not climb a closed stepladder.  Make sure that both sides of the stepladder are locked into place.  Ask someone to hold it for you, if possible.  Clearly mark and make sure that you can see:  Any grab bars or handrails.  First and last steps.  Where the edge of each step is.  Use tools that help you move around (mobility aids) if they are needed. These include:  Canes.  Walkers.  Scooters.  Crutches.  Turn on the lights when you go into a dark area. Replace any light bulbs as soon as they burn out.  Set up your furniture so you have a clear path. Avoid moving your furniture around.  If any of your floors are uneven, fix them.  If there are any pets around you, be aware of where they are.  Review your medicines with your doctor. Some medicines can make you feel dizzy. This can increase your chance of falling. Ask your doctor what other things that you can do to help prevent falls. This information is not intended to replace advice given to you by your health care provider. Make sure you discuss any questions you have with your health care provider. Document Released: 11/07/2008 Document Revised: 06/19/2015 Document Reviewed: 02/15/2014 Elsevier Interactive Patient Education  2017 [employer].

## 2019-12-06 DIAGNOSIS — N1832 Chronic kidney disease, stage 3b: Secondary | ICD-10-CM | POA: Diagnosis not present

## 2019-12-06 DIAGNOSIS — I5042 Chronic combined systolic (congestive) and diastolic (congestive) heart failure: Secondary | ICD-10-CM | POA: Diagnosis not present

## 2019-12-07 LAB — BASIC METABOLIC PANEL
BUN/Creatinine Ratio: 13 (ref 10–24)
BUN: 17 mg/dL (ref 8–27)
CO2: 24 mmol/L (ref 20–29)
Calcium: 8.9 mg/dL (ref 8.6–10.2)
Chloride: 103 mmol/L (ref 96–106)
Creatinine, Ser: 1.3 mg/dL — ABNORMAL HIGH (ref 0.76–1.27)
GFR calc Af Amer: 67 mL/min/{1.73_m2} (ref >59)
GFR calc non Af Amer: 58 mL/min/{1.73_m2} — ABNORMAL LOW (ref >59)
Glucose: 377 mg/dL — ABNORMAL HIGH (ref 65–99)
Potassium: 5.1 mmol/L (ref 3.5–5.2)
Sodium: 141 mmol/L (ref 134–144)

## 2019-12-07 LAB — BRAIN NATRIURETIC PEPTIDE: BNP: 720.3 pg/mL — ABNORMAL HIGH (ref 0.0–100.0)

## 2019-12-08 DIAGNOSIS — I5043 Acute on chronic combined systolic (congestive) and diastolic (congestive) heart failure: Secondary | ICD-10-CM | POA: Diagnosis not present

## 2019-12-09 ENCOUNTER — Inpatient Hospital Stay (HOSPITAL_COMMUNITY)
Admission: EM | Admit: 2019-12-09 | Discharge: 2019-12-12 | DRG: 286 | Disposition: A | Discharge status: Home / Self Care | Payer: No Typology Code available for payment source | Attending: Cardiology | Admitting: Cardiology

## 2019-12-09 ENCOUNTER — Other Ambulatory Visit: Payer: Self-pay

## 2019-12-09 ENCOUNTER — Emergency Department (HOSPITAL_COMMUNITY): Payer: No Typology Code available for payment source

## 2019-12-09 ENCOUNTER — Telehealth: Payer: Self-pay | Admitting: Student

## 2019-12-09 DIAGNOSIS — Z83438 Family history of other disorder of lipoprotein metabolism and other lipidemia: Secondary | ICD-10-CM

## 2019-12-09 DIAGNOSIS — J810 Acute pulmonary edema: Secondary | ICD-10-CM | POA: Diagnosis present

## 2019-12-09 DIAGNOSIS — E1122 Type 2 diabetes mellitus with diabetic chronic kidney disease: Secondary | ICD-10-CM | POA: Diagnosis present

## 2019-12-09 DIAGNOSIS — I2510 Atherosclerotic heart disease of native coronary artery without angina pectoris: Secondary | ICD-10-CM | POA: Diagnosis present

## 2019-12-09 DIAGNOSIS — I471 Supraventricular tachycardia: Secondary | ICD-10-CM | POA: Diagnosis present

## 2019-12-09 DIAGNOSIS — I5043 Acute on chronic combined systolic (congestive) and diastolic (congestive) heart failure: Principal | ICD-10-CM | POA: Diagnosis present

## 2019-12-09 DIAGNOSIS — E785 Hyperlipidemia, unspecified: Secondary | ICD-10-CM | POA: Diagnosis present

## 2019-12-09 DIAGNOSIS — M19042 Primary osteoarthritis, left hand: Secondary | ICD-10-CM | POA: Diagnosis present

## 2019-12-09 DIAGNOSIS — J9602 Acute respiratory failure with hypercapnia: Secondary | ICD-10-CM | POA: Diagnosis present

## 2019-12-09 DIAGNOSIS — Z951 Presence of aortocoronary bypass graft: Secondary | ICD-10-CM

## 2019-12-09 DIAGNOSIS — Z888 Allergy status to other drugs, medicaments and biological substances status: Secondary | ICD-10-CM

## 2019-12-09 DIAGNOSIS — R778 Other specified abnormalities of plasma proteins: Secondary | ICD-10-CM

## 2019-12-09 DIAGNOSIS — Z86010 Personal history of colonic polyps: Secondary | ICD-10-CM

## 2019-12-09 DIAGNOSIS — I252 Old myocardial infarction: Secondary | ICD-10-CM

## 2019-12-09 DIAGNOSIS — Z20822 Contact with and (suspected) exposure to covid-19: Secondary | ICD-10-CM | POA: Diagnosis present

## 2019-12-09 DIAGNOSIS — M19041 Primary osteoarthritis, right hand: Secondary | ICD-10-CM | POA: Diagnosis present

## 2019-12-09 DIAGNOSIS — I130 Hypertensive heart and chronic kidney disease with heart failure and stage 1 through stage 4 chronic kidney disease, or unspecified chronic kidney disease: Secondary | ICD-10-CM | POA: Diagnosis present

## 2019-12-09 DIAGNOSIS — E6609 Other obesity due to excess calories: Secondary | ICD-10-CM | POA: Diagnosis present

## 2019-12-09 DIAGNOSIS — I25118 Atherosclerotic heart disease of native coronary artery with other forms of angina pectoris: Secondary | ICD-10-CM | POA: Diagnosis present

## 2019-12-09 DIAGNOSIS — J90 Pleural effusion, not elsewhere classified: Secondary | ICD-10-CM | POA: Diagnosis not present

## 2019-12-09 DIAGNOSIS — E872 Acidosis: Secondary | ICD-10-CM | POA: Diagnosis present

## 2019-12-09 DIAGNOSIS — Z8679 Personal history of other diseases of the circulatory system: Secondary | ICD-10-CM

## 2019-12-09 DIAGNOSIS — J9601 Acute respiratory failure with hypoxia: Secondary | ICD-10-CM

## 2019-12-09 DIAGNOSIS — N1831 Chronic kidney disease, stage 3a: Secondary | ICD-10-CM | POA: Diagnosis present

## 2019-12-09 DIAGNOSIS — F32A Depression, unspecified: Secondary | ICD-10-CM | POA: Diagnosis present

## 2019-12-09 DIAGNOSIS — I5020 Unspecified systolic (congestive) heart failure: Secondary | ICD-10-CM | POA: Diagnosis present

## 2019-12-09 DIAGNOSIS — Z79899 Other long term (current) drug therapy: Secondary | ICD-10-CM

## 2019-12-09 DIAGNOSIS — I255 Ischemic cardiomyopathy: Secondary | ICD-10-CM | POA: Diagnosis present

## 2019-12-09 DIAGNOSIS — Z955 Presence of coronary angioplasty implant and graft: Secondary | ICD-10-CM

## 2019-12-09 DIAGNOSIS — J811 Chronic pulmonary edema: Secondary | ICD-10-CM | POA: Diagnosis not present

## 2019-12-09 DIAGNOSIS — I472 Ventricular tachycardia: Secondary | ICD-10-CM | POA: Diagnosis present

## 2019-12-09 DIAGNOSIS — Z9981 Dependence on supplemental oxygen: Secondary | ICD-10-CM

## 2019-12-09 DIAGNOSIS — Z6835 Body mass index (BMI) 35.0-35.9, adult: Secondary | ICD-10-CM

## 2019-12-09 DIAGNOSIS — Z794 Long term (current) use of insulin: Secondary | ICD-10-CM

## 2019-12-09 DIAGNOSIS — N289 Disorder of kidney and ureter, unspecified: Secondary | ICD-10-CM

## 2019-12-09 DIAGNOSIS — R7989 Other specified abnormal findings of blood chemistry: Secondary | ICD-10-CM

## 2019-12-09 DIAGNOSIS — Z833 Family history of diabetes mellitus: Secondary | ICD-10-CM

## 2019-12-09 DIAGNOSIS — Z8249 Family history of ischemic heart disease and other diseases of the circulatory system: Secondary | ICD-10-CM

## 2019-12-09 DIAGNOSIS — Z87891 Personal history of nicotine dependence: Secondary | ICD-10-CM

## 2019-12-09 DIAGNOSIS — I517 Cardiomegaly: Secondary | ICD-10-CM | POA: Diagnosis not present

## 2019-12-09 DIAGNOSIS — G4733 Obstructive sleep apnea (adult) (pediatric): Secondary | ICD-10-CM | POA: Diagnosis present

## 2019-12-09 DIAGNOSIS — Z95810 Presence of automatic (implantable) cardiac defibrillator: Secondary | ICD-10-CM

## 2019-12-09 LAB — CBC WITH DIFFERENTIAL/PLATELET
Abs Immature Granulocytes: 0.11 10*3/uL — ABNORMAL HIGH (ref 0.00–0.07)
Basophils Absolute: 0.1 10*3/uL (ref 0.0–0.1)
Basophils Relative: 1 %
Eosinophils Absolute: 0 10*3/uL (ref 0.0–0.5)
Eosinophils Relative: 0 %
HCT: 44.6 % (ref 39.0–52.0)
Hemoglobin: 13.9 g/dL (ref 13.0–17.0)
Immature Granulocytes: 1 %
Lymphocytes Relative: 3 %
Lymphs Abs: 0.5 10*3/uL — ABNORMAL LOW (ref 0.7–4.0)
MCH: 30 pg (ref 26.0–34.0)
MCHC: 31.2 g/dL (ref 30.0–36.0)
MCV: 96.3 fL (ref 80.0–100.0)
Monocytes Absolute: 0.9 10*3/uL (ref 0.1–1.0)
Monocytes Relative: 6 %
Neutro Abs: 13.8 10*3/uL — ABNORMAL HIGH (ref 1.7–7.7)
Neutrophils Relative %: 89 %
Platelets: 198 10*3/uL (ref 150–400)
RBC: 4.63 MIL/uL (ref 4.22–5.81)
RDW: 13.3 % (ref 11.5–15.5)
WBC: 15.4 10*3/uL — ABNORMAL HIGH (ref 4.0–10.5)
nRBC: 0 % (ref 0.0–0.2)

## 2019-12-09 LAB — BASIC METABOLIC PANEL
Anion gap: 12 (ref 5–15)
BUN: 27 mg/dL — ABNORMAL HIGH (ref 8–23)
CO2: 18 mmol/L — ABNORMAL LOW (ref 22–32)
Calcium: 8.7 mg/dL — ABNORMAL LOW (ref 8.9–10.3)
Chloride: 104 mmol/L (ref 98–111)
Creatinine, Ser: 1.6 mg/dL — ABNORMAL HIGH (ref 0.61–1.24)
GFR, Estimated: 48 mL/min — ABNORMAL LOW (ref >60)
Glucose, Bld: 342 mg/dL — ABNORMAL HIGH (ref 70–99)
Potassium: 4.9 mmol/L (ref 3.5–5.1)
Sodium: 134 mmol/L — ABNORMAL LOW (ref 135–145)

## 2019-12-09 LAB — I-STAT ARTERIAL BLOOD GAS, ED
Acid-base deficit: 5 mmol/L — ABNORMAL HIGH (ref 0.0–2.0)
Acid-base deficit: 2 mmol/L (ref 0.0–2.0)
Bicarbonate: 25 mmol/L (ref 20.0–28.0)
Bicarbonate: 24.1 mmol/L (ref 20.0–28.0)
Calcium, Ion: 1.21 mmol/L (ref 1.15–1.40)
Calcium, Ion: 1.18 mmol/L (ref 1.15–1.40)
HCT: 44 % (ref 39.0–52.0)
HCT: 40 % (ref 39.0–52.0)
Hemoglobin: 15 g/dL (ref 13.0–17.0)
Hemoglobin: 13.6 g/dL (ref 13.0–17.0)
O2 Saturation: 95 %
O2 Saturation: 99 %
Patient temperature: 95.6
Patient temperature: 95.6
Potassium: 4.7 mmol/L (ref 3.5–5.1)
Potassium: 4.7 mmol/L (ref 3.5–5.1)
Sodium: 138 mmol/L (ref 135–145)
Sodium: 136 mmol/L (ref 135–145)
TCO2: 27 mmol/L (ref 22–32)
TCO2: 25 mmol/L (ref 22–32)
pCO2 arterial: 60.9 mmHg — ABNORMAL HIGH (ref 32.0–48.0)
pCO2 arterial: 43 mmHg (ref 32.0–48.0)
pH, Arterial: 7.212 — ABNORMAL LOW (ref 7.350–7.450)
pH, Arterial: 7.348 — ABNORMAL LOW (ref 7.350–7.450)
pO2, Arterial: 83 mmHg (ref 83.0–108.0)
pO2, Arterial: 144 mmHg — ABNORMAL HIGH (ref 83.0–108.0)

## 2019-12-09 LAB — MAGNESIUM: Magnesium: 1.7 mg/dL (ref 1.7–2.4)

## 2019-12-09 LAB — TROPONIN I (HIGH SENSITIVITY)
Troponin I (High Sensitivity): 584 ng/L (ref <18)
Troponin I (High Sensitivity): 1728 ng/L (ref <18)
Troponin I (High Sensitivity): 1620 ng/L (ref <18)

## 2019-12-09 LAB — CBC
HCT: 38.6 % — ABNORMAL LOW (ref 39.0–52.0)
Hemoglobin: 12.3 g/dL — ABNORMAL LOW (ref 13.0–17.0)
MCH: 29.5 pg (ref 26.0–34.0)
MCHC: 31.9 g/dL (ref 30.0–36.0)
MCV: 92.6 fL (ref 80.0–100.0)
Platelets: 230 10*3/uL (ref 150–400)
RBC: 4.17 MIL/uL — ABNORMAL LOW (ref 4.22–5.81)
RDW: 13.5 % (ref 11.5–15.5)
WBC: 10.3 10*3/uL (ref 4.0–10.5)
nRBC: 0 % (ref 0.0–0.2)

## 2019-12-09 LAB — GLUCOSE, CAPILLARY
Glucose-Capillary: 177 mg/dL — ABNORMAL HIGH (ref 70–99)
Glucose-Capillary: 344 mg/dL — ABNORMAL HIGH (ref 70–99)

## 2019-12-09 LAB — BRAIN NATRIURETIC PEPTIDE: B Natriuretic Peptide: 910 pg/mL — ABNORMAL HIGH (ref 0.0–100.0)

## 2019-12-09 LAB — HEMOGLOBIN A1C
Hgb A1c MFr Bld: 10.8 % — ABNORMAL HIGH (ref 4.8–5.6)
Mean Plasma Glucose: 263.26 mg/dL

## 2019-12-09 LAB — HEPARIN LEVEL (UNFRACTIONATED): Heparin Unfractionated: <0.1 IU/mL — ABNORMAL LOW (ref 0.30–0.70)

## 2019-12-09 LAB — CREATININE, SERUM
Creatinine, Ser: 1.47 mg/dL — ABNORMAL HIGH (ref 0.61–1.24)
GFR, Estimated: 53 mL/min — ABNORMAL LOW (ref >60)

## 2019-12-09 LAB — RESPIRATORY PANEL BY RT PCR (FLU A&B, COVID)
Influenza A by PCR: NEGATIVE
Influenza B by PCR: NEGATIVE
SARS Coronavirus 2 by RT PCR: NEGATIVE

## 2019-12-09 MED ORDER — HEPARIN BOLUS VIA INFUSION
4000.0000 [IU] | Freq: Once | INTRAVENOUS | Status: AC
  Administered 2019-12-09: 4000 [IU] via INTRAVENOUS
  Filled 2019-12-09: qty 4000

## 2019-12-09 MED ORDER — SACUBITRIL-VALSARTAN 97-103 MG PO TABS
1.0000 | ORAL_TABLET | Freq: Two times a day (BID) | ORAL | Status: DC
  Administered 2019-12-10 – 2019-12-11 (×3): 1 via ORAL
  Filled 2019-12-09 (×7): qty 1

## 2019-12-09 MED ORDER — ROSUVASTATIN CALCIUM 20 MG PO TABS
40.0000 mg | ORAL_TABLET | Freq: Every day | ORAL | Status: DC
  Administered 2019-12-09 – 2019-12-11 (×3): 40 mg via ORAL
  Filled 2019-12-09 (×3): qty 2

## 2019-12-09 MED ORDER — HEPARIN (PORCINE) 25000 UT/250ML-% IV SOLN
1500.0000 [IU]/h | INTRAVENOUS | Status: DC
  Administered 2019-12-09: 1100 [IU]/h via INTRAVENOUS
  Administered 2019-12-09 – 2019-12-10 (×2): 1500 [IU]/h via INTRAVENOUS
  Filled 2019-12-09 (×2): qty 250

## 2019-12-09 MED ORDER — HEPARIN (PORCINE) 25000 UT/250ML-% IV SOLN
1100.0000 [IU]/h | INTRAVENOUS | Status: DC
  Administered 2019-12-09 (×2): 1100 [IU]/h via INTRAVENOUS
  Filled 2019-12-09: qty 250

## 2019-12-09 MED ORDER — SODIUM CHLORIDE 0.9 % IV SOLN: 250.0000 mL | INTRAVENOUS | Status: DC | PRN

## 2019-12-09 MED ORDER — SODIUM CHLORIDE 0.9% FLUSH: 3.0000 mL | INTRAVENOUS | Status: DC | PRN

## 2019-12-09 MED ORDER — ISOSORBIDE MONONITRATE ER 30 MG PO TB24
30.0000 mg | ORAL_TABLET | Freq: Every day | ORAL | Status: DC
  Administered 2019-12-10 – 2019-12-12 (×2): 30 mg via ORAL
  Filled 2019-12-09 (×2): qty 1

## 2019-12-09 MED ORDER — SERTRALINE HCL 50 MG PO TABS
50.0000 mg | ORAL_TABLET | Freq: Every day | ORAL | Status: DC
  Administered 2019-12-09 – 2019-12-12 (×3): 50 mg via ORAL
  Filled 2019-12-09 (×3): qty 1

## 2019-12-09 MED ORDER — VITAMIN B-12 1000 MCG PO TABS
2000.0000 ug | ORAL_TABLET | Freq: Every day | ORAL | Status: DC
  Administered 2019-12-10 – 2019-12-12 (×2): 2000 ug via ORAL
  Filled 2019-12-09 (×2): qty 2

## 2019-12-09 MED ORDER — CARVEDILOL 12.5 MG PO TABS
12.5000 mg | ORAL_TABLET | Freq: Two times a day (BID) | ORAL | Status: DC
  Administered 2019-12-09 – 2019-12-12 (×5): 12.5 mg via ORAL
  Filled 2019-12-09 (×5): qty 1

## 2019-12-09 MED ORDER — SODIUM CHLORIDE 0.9 % IV BOLUS
500.0000 mL | Freq: Once | INTRAVENOUS | Status: AC
  Administered 2019-12-09: 500 mL via INTRAVENOUS

## 2019-12-09 MED ORDER — SACUBITRIL-VALSARTAN 97-103 MG PO TABS: 1.0000 | ORAL_TABLET | Freq: Two times a day (BID) | ORAL | Status: DC

## 2019-12-09 MED ORDER — ASPIRIN EC 81 MG PO TBEC
81.0000 mg | DELAYED_RELEASE_TABLET | Freq: Every day | ORAL | Status: DC
  Administered 2019-12-10 – 2019-12-12 (×3): 81 mg via ORAL
  Filled 2019-12-09 (×4): qty 1

## 2019-12-09 MED ORDER — TICAGRELOR 90 MG PO TABS
90.0000 mg | ORAL_TABLET | Freq: Two times a day (BID) | ORAL | Status: DC
  Administered 2019-12-09 – 2019-12-10 (×2): 90 mg via ORAL
  Filled 2019-12-09 (×2): qty 1

## 2019-12-09 MED ORDER — VITAMIN D 25 MCG (1000 UNIT) PO TABS
2000.0000 [IU] | ORAL_TABLET | Freq: Two times a day (BID) | ORAL | Status: DC
  Administered 2019-12-09 – 2019-12-12 (×5): 2000 [IU] via ORAL
  Filled 2019-12-09 (×7): qty 2

## 2019-12-09 MED ORDER — SODIUM CHLORIDE 0.9% FLUSH
3.0000 mL | Freq: Two times a day (BID) | INTRAVENOUS | Status: DC
  Administered 2019-12-09 – 2019-12-10 (×4): 3 mL via INTRAVENOUS

## 2019-12-09 MED ORDER — FUROSEMIDE 10 MG/ML IJ SOLN
40.0000 mg | Freq: Once | INTRAMUSCULAR | Status: AC
  Administered 2019-12-09: 40 mg via INTRAVENOUS
  Filled 2019-12-09: qty 4

## 2019-12-09 MED ORDER — AMIODARONE HCL 100 MG PO TABS
100.0000 mg | ORAL_TABLET | Freq: Every day | ORAL | Status: DC
  Administered 2019-12-10 – 2019-12-12 (×2): 100 mg via ORAL
  Filled 2019-12-09 (×2): qty 1

## 2019-12-09 MED ORDER — ACETAMINOPHEN 325 MG PO TABS: 650.0000 mg | ORAL_TABLET | ORAL | Status: DC | PRN

## 2019-12-09 MED ORDER — ONDANSETRON HCL 4 MG/2ML IJ SOLN
4.0000 mg | Freq: Four times a day (QID) | INTRAMUSCULAR | Status: DC | PRN
  Administered 2019-12-09: 4 mg via INTRAVENOUS
  Filled 2019-12-09: qty 2

## 2019-12-09 MED ORDER — NITROGLYCERIN 0.4 MG SL SUBL: 0.4000 mg | SUBLINGUAL_TABLET | SUBLINGUAL | Status: DC | PRN

## 2019-12-09 MED ORDER — HEPARIN SODIUM (PORCINE) 5000 UNIT/ML IJ SOLN: 5000.0000 [IU] | Freq: Three times a day (TID) | INTRAMUSCULAR | Status: DC

## 2019-12-09 MED ORDER — INSULIN ASPART PROT & ASPART (70-30 MIX) 100 UNIT/ML ~~LOC~~ SUSP
60.0000 [IU] | Freq: Two times a day (BID) | SUBCUTANEOUS | Status: DC
  Administered 2019-12-10 – 2019-12-11 (×2): 60 [IU] via SUBCUTANEOUS
  Filled 2019-12-09 (×2): qty 10

## 2019-12-09 MED ORDER — BUMETANIDE 0.25 MG/ML IJ SOLN
1.0000 mg | Freq: Two times a day (BID) | INTRAMUSCULAR | Status: DC
  Administered 2019-12-10 – 2019-12-11 (×3): 1 mg via INTRAVENOUS
  Filled 2019-12-09 (×7): qty 4

## 2019-12-09 MED ORDER — NITROGLYCERIN 2 % TD OINT
1.0000 [in_us] | TOPICAL_OINTMENT | Freq: Once | TRANSDERMAL | Status: AC
  Administered 2019-12-09: 1 [in_us] via TOPICAL
  Filled 2019-12-09: qty 1

## 2019-12-09 MED ORDER — INSULIN ASPART 100 UNIT/ML ~~LOC~~ SOLN
0.0000 [IU] | Freq: Every day | SUBCUTANEOUS | Status: DC
  Administered 2019-12-09: 4 [IU] via SUBCUTANEOUS
  Administered 2019-12-11: 3 [IU] via SUBCUTANEOUS

## 2019-12-09 MED ORDER — INSULIN ASPART 100 UNIT/ML ~~LOC~~ SOLN
0.0000 [IU] | Freq: Three times a day (TID) | SUBCUTANEOUS | Status: DC
  Administered 2019-12-09: 3 [IU] via SUBCUTANEOUS
  Administered 2019-12-10 (×2): 8 [IU] via SUBCUTANEOUS
  Administered 2019-12-11: 5 [IU] via SUBCUTANEOUS

## 2019-12-09 NOTE — ED Notes (Signed)
Pts IV was not working. Heparin was stopped. RN took IV out. RN put in IV consult.

## 2019-12-09 NOTE — H&P (Signed)
HISTORY AND PHYSICAL  Patient ID: Martin Bush MRN: [id number] DOB/AGE: [birth year] 64 y.o.  Admit date: 12/09/2019 Attending physician: Rex Kras, DO, [facility] Primary Physician:  [name], MD  Chief complaint: Shortness of breath  HPI:  Martin Bush is a 64 y.o. 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  Recently hospitalized in September 2021 with acute pulmonary edema and a non-STEMI during that hospitalization he underwent repeat angioplasty and stenting of the left main and LCx.  He has been followed up in the office closely given his complex cardiac history.  Patient is accompanied by his wife Martin Bush at bedside during today's encounter.  Patient states that he was doing well until yesterday night after supper he started sweating profusely, experienced shortness of breath, his wife thought he was pale looking, she placed him on oxygen at 2 L he continued to be hypoxic.  They were concerned and therefore EMS was called and patient was transferred to hospital for further evaluation and management.  Per electronic medical records when EMS arrived patient was hypoxic at 83% on room air and once placed on nonrebreather the oxygen saturation improved to 89%.  He continued to be diaphoretic and given his history he was brought [facility] via EMS for further evaluation and management.    Initial work-up in the ED noted acute exacerbation of heart failure  with reduced EF.  He was acidotic and pulmonary edema per chest x-ray patient was placed on BiPAP as well as given IV diuretics and symptomatically has improved overnight.  Cardiology was consulted for admission and further evaluation and management.  At the time of evaluation patient is sitting upright, resting in bed comfortably, and in no acute distress.  He denies any chest pain.  States that his shortness of breath is his anginal equivalent as he had similar symptoms back in September 2021.  He also states that he stopped taking torsemide as of Tuesday of last week because he is concerned of becoming dehydration and constipation.    When he presented to the hospital his systolic blood pressure was 177/104, 89% on nonrebreather, 26% respiratory rate, 88 bpm, and temperature 95.6.  He also complains of symptoms of orthopnea and paroxysmal nocturnal dyspnea.  Patient states that he is compliant with his dual antiplatelet therapy given the recent angioplasty and stents.   ALLERGIES: Allergies  Allergen Reactions  . Diltiazem Rash    PAST MEDICAL HISTORY: Past Medical History:  Diagnosis Date  . AICD (automatic cardioverter/defibrillator) present    Medtronic  . Arthritis    "thumbs"  (08/02/2017)  . CHF (congestive heart failure) ([place])   . Chronic combined systolic and diastolic heart failure ([place])    a. 08/2017 Echo: EF 20-25%, mod glob HK. Sev distal ant sept, inflat HK. Apical AK. Gr2 DD. Mildly reduced RV fxn.  . Colon polyps   . Coronary artery disease    a. s/p CABG x 2 (LIMA->LAD, VG->OM); b. Multiple PCI's to LM/LCX/OM; c. 07/2017 PTCA of LM/LCX w/ early ISR-->repeat PCI/DES  to LM (3.5x20 Synergy DES) and LCX (3.0x20 Synergy DES); d. 07/2018 Relook Cath: LM patent stent, LAD 100ost, LCX patent stent, OM1 99/60, LIMA->LAD ok. VG->dLCX 63 (old).  . Depression   . Encounter for assessment of implantable cardioverter-defibrillator (ICD) 09/27/2018  . High cholesterol   . Hypertension   .  ICD; Biventricular  Medtronic ICD Amplia MRI QWuad CRT-D  in situ 10/29/14 10/29/2014   Remote ICD check 09.23.20:  One 6 beat NSVT. No therapy.  1 SVT episode @ 130 bpm (38 Sec).  There were 23 Vent sense episodes detected for up to 1.1 min/day (AT). Health trends (patient activity, heart rate variability, average heart rates) are stable.Trans-thoracic impedance trends and the OptiVol Fluid Index do no present significant abnormalities. Battery longevity is 4.3 years. RA pacing is 3  . Ischemic cardiomyopathy 09/27/2018  . MI (myocardial infarction) ([place]) 2003  . NSTEMI (non-ST elevated myocardial infarction) ([place]) 07/16/2014  . OSA on CPAP   . Oxygen deficiency   . Pneumonia 10/2014  . Proteinuria   . Sleep apnea   . Type II diabetes mellitus (HCC)    insulin dependent    PAST SURGICAL HISTORY: Past Surgical History:  Procedure Laterality Date  . BIOPSY  09/19/2018   Procedure: BIOPSY;  Surgeon: Thornton Park, MD;  [facility]: [facility];  Service: Gastroenterology;;  . CARDIAC CATHETERIZATION N/A 07/16/2014   Procedure: Left Heart Cath and Coronary Angiography;  Surgeon: Adrian Prows, MD;  Location: [place] [facility];  Service: Cardiovascular;  Laterality: N/A;  . CARDIAC CATHETERIZATION  07/16/2014   Procedure: Coronary Balloon Angioplasty;  Surgeon: Adrian Prows, MD;  Location: [place] [facility];  Service: Cardiovascular;;  . CARDIAC CATHETERIZATION  2003  . CARDIAC DEFIBRILLATOR PLACEMENT  2016  . CATARACT EXTRACTION W/ INTRAOCULAR LENS IMPLANT Left 03/2014  . COLONOSCOPY    . COLONOSCOPY WITH PROPOFOL N/A 09/19/2018   Procedure: COLONOSCOPY WITH PROPOFOL;  Surgeon: Thornton Park, MD;  [facility];  Service: Gastroenterology;  Laterality: N/A;  . CORONARY ANGIOPLASTY WITH STENT PLACEMENT     "I've got a total of 8 stents in there; mostly doine at [facility]" (08/02/2017)  . CORONARY ARTERY BYPASS GRAFT  ~ 2003   "CABG X2"; [facility]  . CORONARY ATHERECTOMY N/A  08/16/2017   Procedure: CORONARY ATHERECTOMY;  Surgeon: Nigel Mormon, MD;  Location: [place] [facility];  Service: Cardiovascular;  Laterality: N/A;  . CORONARY BALLOON ANGIOPLASTY N/A 08/04/2017   Procedure: CORONARY BALLOON ANGIOPLASTY;  Surgeon: Adrian Prows, MD;  Location: [place] [facility];  Service: Cardiovascular;  Laterality: N/A;  . CORONARY BALLOON ANGIOPLASTY N/A 01/11/2019   Procedure: CORONARY BALLOON ANGIOPLASTY;  Surgeon: Adrian Prows, MD;  Location: [place] [facility];  Service: Cardiovascular;  Laterality: N/A;  . CORONARY BALLOON ANGIOPLASTY N/A 10/09/2019   Procedure: CORONARY BALLOON ANGIOPLASTY;  Surgeon: Adrian Prows, MD;  Location: [place] [facility];  Service: Cardiovascular;  Laterality: N/A;  . CORONARY STENT INTERVENTION N/A 08/16/2017   Procedure: CORONARY STENT INTERVENTION;  Surgeon: Nigel Mormon, MD;  Location: [place] [facility];  Service: Cardiovascular;  Laterality: N/A;  . CORONARY STENT INTERVENTION N/A 10/09/2019   Procedure: CORONARY STENT INTERVENTION;  Surgeon: Adrian Prows, MD;  Location: [place] [facility];  Service: Cardiovascular;  Laterality: N/A;  . ELBOW SURGERY Left ?2001   "pinched nerve"  . ENDOSCOPIC MUCOSAL RESECTION  12/18/2018   Procedure: ENDOSCOPIC MUCOSAL RESECTION;  Surgeon: Rush Landmark Telford Nab., MD;  Location: [place];  Service:  Gastroenterology;;  . Otho Darner SIGMOIDOSCOPY N/A 12/18/2018   Procedure: Beryle Quant;  Surgeon: Irving Copas., MD;  Location: [place];  Service: Gastroenterology;  Laterality: N/A;  . HEMOSTASIS CLIP PLACEMENT  12/18/2018   Procedure: HEMOSTASIS CLIP PLACEMENT;  Surgeon: Irving Copas., MD;  Location: [place];  Service: Gastroenterology;;  . LEFT HEART CATH AND CORONARY ANGIOGRAPHY N/A 01/11/2019   Procedure: LEFT HEART CATH AND CORONARY ANGIOGRAPHY;  Surgeon: Adrian Prows, MD;  Location: [place] [facility];  Service: Cardiovascular;  Laterality: N/A;  . LEFT  HEART CATH AND CORS/GRAFTS ANGIOGRAPHY N/A 08/04/2017   Procedure: LEFT HEART CATH AND CORS/GRAFTS ANGIOGRAPHY;  Surgeon: Adrian Prows, MD;  Location: [place] [facility];  Service: Cardiovascular;  Laterality: N/A;  . LEFT HEART CATH AND CORS/GRAFTS ANGIOGRAPHY N/A 08/20/2017   Procedure: LEFT HEART CATH AND CORS/GRAFTS ANGIOGRAPHY;  Surgeon: Nigel Mormon, MD;  Location: [place] [facility];  Service: Cardiovascular;  Laterality: N/A;  . LEFT HEART CATH AND CORS/GRAFTS ANGIOGRAPHY N/A 01/08/2019   Procedure: LEFT HEART CATH AND CORS/GRAFTS ANGIOGRAPHY;  Surgeon: Wellington Hampshire, MD;  Location: [place] [facility];  Service: Cardiovascular;  Laterality: N/A;  . LEFT HEART CATH AND CORS/GRAFTS ANGIOGRAPHY N/A 10/09/2019   Procedure: LEFT HEART CATH AND CORS/GRAFTS ANGIOGRAPHY;  Surgeon: Adrian Prows, MD;  Location: [place] [facility];  Service: Cardiovascular;  Laterality: N/A;  . POLYPECTOMY  09/19/2018   Procedure: POLYPECTOMY;  Surgeon: Thornton Park, MD;  [facility];  Service: Gastroenterology;;  . Lia Foyer INJECTION  09/19/2018   Procedure: SUBMUCOSAL INJECTION;  Surgeon: Thornton Park, MD;  [facility];  Service: Gastroenterology;;  . Lia Foyer LIFTING INJECTION  12/18/2018   Procedure: SUBMUCOSAL LIFTING INJECTION;  Surgeon: Irving Copas., MD;  Location: [facility];  Service: Gastroenterology;;    FAMILY HISTORY: The patient family history includes Diabetes in his father; Heart disease in his father; Hyperlipidemia in his father; Hypertension in his father; Lung cancer in his mother; Uterine cancer in his mother.   SOCIAL HISTORY:  The patient  reports that he quit smoking about 19 years ago. His smoking use included cigars. He has a 6.75 pack-year smoking history. He quit smokeless tobacco use about 19 years ago. He reports previous alcohol use. He reports that he does not use drugs.  MEDICATIONS: Current Outpatient Medications  Medication  Instructions  . amiodarone (PACERONE) 100 mg, Oral, Daily  . aspirin 81 mg, Oral, Daily  . carvedilol (COREG) 12.5 mg, Oral, 2 times daily  . Cyanocobalamin 2,500 mcg, Oral, Daily  . Fish Oil 2,000 mg, Oral, 3 times daily  . insulin aspart protamine- aspart (NOVOLOG MIX 70/30) (70-30) 100 UNIT/ML injection Inject 0.66 mLs (66 Units total) into the skin daily with breakfast AND 0.6 mLs (60 Units total) daily with supper.  . isosorbide mononitrate (IMDUR) 30 mg, Oral, Daily  . liraglutide (VICTOZA) 1.8 mg, Subcutaneous, Daily  . metFORMIN (GLUCOPHAGE) 1,000 mg, Oral, 2 times daily with meals, Taking 2 tabs twice a day  . metFORMIN (GLUCOPHAGE-XR) 1,000 mg, Oral, 2 times daily  . nitroGLYCERIN (NITROSTAT) 0.4 mg, Sublingual, Every 5 min x3 PRN  . OXYGEN 2 L/min, Inhalation, See admin instructions, 2 L/min at bedtime in conjunction with CPAP  . potassium chloride SA (KLOR-CON) 20 MEQ tablet TAKE 1 TABLET BY MOUTH TWICE A DAY  . PRESCRIPTION MEDICATION Inhalation, See admin instructions, CPAP- At bedtime  . rosuvastatin (CRESTOR) 40 mg, Oral, Daily at bedtime  . sacubitril-valsartan (ENTRESTO) 97-103 MG 1 tablet,  Oral, 2 times daily  . sertraline (ZOLOFT) 50 mg, Oral, Daily at bedtime  . sertraline (ZOLOFT) 50 mg, Oral, Daily  . ticagrelor (BRILINTA) 90 mg, Oral, 2 times daily  . torsemide (DEMADEX) 20 MG tablet TAKE 1 TABLET BY MOUTH TWICE A DAY  . Vitamin D3 2,000 Units, Oral, 2 times daily    . sodium chloride    . heparin      14 ORGAN REVIEW OF SYSTEMS: Review of Systems  Constitutional: Positive for weight gain. Negative for chills and fever.  HENT: Negative for hoarse voice and nosebleeds.   Eyes: Negative for discharge, double vision and pain.  Cardiovascular: Positive for dyspnea on exertion, orthopnea and paroxysmal nocturnal dyspnea. Negative for chest pain, claudication, leg swelling, near-syncope, palpitations and syncope.  Respiratory: Negative for hemoptysis and shortness  of breath.   Musculoskeletal: Negative for muscle cramps and myalgias.  Gastrointestinal: Negative for abdominal pain, constipation, diarrhea, hematemesis, hematochezia, melena, nausea and vomiting.  Neurological: Positive for light-headedness. Negative for dizziness.  All other systems reviewed and are negative.   PHYSICAL EXAM: Vitals with BMI 12/09/2019 12/09/2019 12/09/2019  Height - - -  Weight - - -  BMI - - -  Systolic 132 440 102  Diastolic 64 70 90  Pulse 73 79 77     Intake/Output Summary (Last 24 hours) at 12/09/2019 1551 Last data filed at 12/09/2019 1505 Gross per 24 hour  Intake 466.97 ml  Output 1090 ml  Net -623.03 ml    Net IO Since Admission: -623.03 mL [12/09/19 1551] CONSTITUTIONAL: Well-developed and well-nourished. No acute distress.  SKIN: Skin is warm and dry. No rash noted. No cyanosis. No pallor. No jaundice HEAD: Normocephalic and atraumatic.  EYES: No scleral icterus MOUTH/THROAT: Moist oral membranes.  NECK: No JVD present. No thyromegaly noted. Left carotid bruits  LYMPHATIC: No visible cervical adenopathy.  CHEST Normal respiratory effort. No intercostal retractions  LUNGS: No stridor. No wheezes. Bilateral rales.  CARDIOVASCULAR: Regular rate and rhythm, positive V2-Z3, soft holosystolic murmur at the apex, no rubs or gallops appreciated. ABDOMINAL: Obese, soft, nontender, nondistended, positive bowel sounds all 4 quadrants. No apparent ascites.  EXTREMITIES: +1 bilateral peripheral edema, cool to touch bilaterally.  HEMATOLOGIC: No significant bruising NEUROLOGIC: Oriented to person, place, and time. Nonfocal. Normal muscle tone.  PSYCHIATRIC: Normal mood and affect. Normal behavior. Cooperative  RADIOLOGY: DG Chest Port 1 View  Result Date: 12/09/2019 CLINICAL DATA:  Respiratory distress EXAM: PORTABLE CHEST 1 VIEW COMPARISON:  10/08/2019 FINDINGS: The lungs are symmetrically well expanded. There are bilateral perihilar airspace  infiltrates present, as well as trace right basilar peripheral interstitial pulmonary infiltrate most in keeping with moderate mixed alveolar and interstitial pulmonary edema, likely cardiogenic in nature. There is no pneumothorax or pleural effusion. Coronary artery bypass grafting and coronary artery stenting has been performed. Mild to moderate cardiomegaly is stable. Left subclavian pacemaker defibrillator is unchanged. IMPRESSION: Bilateral perihilar and lower lung zone pulmonary infiltrates most in keeping with moderate probable cardiogenic pulmonary edema. Electronically Signed   By: Fidela Salisbury MD   On: 12/09/2019 02:41    LABORATORY DATA: Lab Results  Component Value Date   WBC 15.4 (H) 12/09/2019   HGB 13.6 12/09/2019   HCT 40.0 12/09/2019   MCV 96.3 12/09/2019   PLT 198 12/09/2019    Recent Labs  Lab 12/09/19 0538  NA 134*  136  K 4.9  4.7  CL 104  CO2 18*  BUN 27*  CREATININE 1.60*  CALCIUM  8.7*  GLUCOSE 342*    Lipid Panel     Component Value Date/Time   CHOL 127 04/18/2019 1019   TRIG 181 (H) 04/18/2019 1019   HDL 39 (L) 04/18/2019 1019   CHOLHDL 3.2 01/04/2019 0640   VLDL 30 01/04/2019 0640   LDLCALC 58 04/18/2019 1019    BNP (last 3 results) Recent Labs    10/09/19 0005 12/06/19 1431 12/09/19 0243  BNP 2,026.8* 720.3* 910.0*    HEMOGLOBIN A1C Lab Results  Component Value Date   HGBA1C 11.7 (H) 10/09/2019   MPG 289.09 10/09/2019   Cardiac Panel (last 3 results) Recent Labs    12/09/19 0538  TROPONINIHS 584*    TSH Recent Labs    04/18/19 1019 10/09/19 0559  TSH 1.460 1.422    CARDIAC DATABASE: EKG: 12/09/2019: Atrial sensed ventricular paced rhythm, 99 bpm.  Echocardiogram: 06/15/2019:  Left ventricle cavity is moderately dilated. Moderate concentric  hypertrophy of the left ventricle. Doppler evidence of grade III  (restrictive) diastolic dysfunction, elevated LAP. Left ventricle regional  wall motion findings: Basal  inferolateral akinesis. 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 Mild global hypokinesis. Abnormal septal wall motion due  to post-operative coronary artery bypass graft. Moderately depressed LV  systolic function with visual EF 35-40%.  Left atrial cavity is mildly dilated at 4.3 cm.  Right ventricle cavity is normal in size. Normal right ventricular  function. Pacemaker lead/ICD lead noted in the RV.  Trileaflet aortic valve. Mild (Grade I) aortic regurgitation.  Compared to 01/04/2019, EF improved from 25-30%  Heart Catheterization: Left heart catheterization 10/09/2019: LV: Normal LVEDP.  There was no pressure gradient across the aortic valve.  Angiogram not performed to conserve contrast. Right coronary artery nondominant and severely diffusely diseased unchanged from prior cardiac catheterization. Left main: Mid to distal left main has in-stent restenosis of 99% and extends into the dominant circumflex which also has proximal in-stent 99% stenosis with TIMI II flow.  Successful scoring balloon angioplasty with 3.5 x 15 mm Wolverine at high 14 atmospheric pressure, due to recall, haziness, stented with 4.0 x 18 mm resolute Onyx DES at 18 atmospheric pressure for 60 seconds, stenosis reduced in the left main and proximal in-stent restenosis from 99% to 0% with improvement in TIMI flow from 2-3. Successful PTCA and Cutting Balloon angioplasty with Wolverine 3.0 x 10 mm balloon in the mid segment of the dominant circumflex coronary artery in-stent restenosis.  Stenosis reduced to 0% at 14 atmospheric pressure inflations x2.  TIMI-3 to TIMI-3 flow maintained. LAD: Is occluded in the proximal segment.  Distal LAD supplied by LIMA.  LIMA to LAD is widely patent.  Recommendation: Patient will be observed for 6 to 8 hours, will be ambulated, if he remains stable, I would like to discharge him today in view of COVID-19 pandemic.  70 mL of contrast was utilized.  Extremely complex patient but with  excellent results and hopefully he will not have recurrent restenosis in the left main and proximal circumflex stent.  With regard to risk factor modification, he is on appropriate medical therapy, he is also on dual antiplatelet therapy.  Diabetes needs to be controlled better.  IMPRESSION & RECOMMENDATIONS: Martin Bush is a 64 y.o. male whose past medical history and cardiovascular risk factors include: Established CAD with history of CABG x2 in 2002 with only LIMA to the LAD being patent with extensive native coronary artery disease with multiple PCI's, acute on chronic systolic and diastolic heart failure/stage C/ NYHA class III,  ischemic cardiomyopathy, recent non-STEMI, insulin-dependent diabetes mellitus type 2 with chronic kidney disease stage III, history of ventricular tachycardia status post BiV ICD, obesity due to excess calories.  Acute on chronic systolic and diastolic heart failure, stage C, NYHA class III/IV: Improving.  Patient has been compliant with his heart failure medications except torsemide which is held since Tuesday of last week secondary to being concerned of dehydration and constipation.  Due to hypoxia and acidosis he required BiPAP intervention overnight and overall remained stable today.  Restart guideline directed medical therapy.  Strict I's and O's and daily weights.  Telemetry.  Non-STEMI:  Patient presents with shortness of breath which is his anginal equivalent in the setting of elevated troponins and complex coronary revascularization progressive CAD cannot be ruled out.  Currently patient is chest pain-free.  No recent use of sublingual nitroglycerin tablets.  Shortness of breath is improving.  Continue management for his underlying acute on chronic exacerbation of heart failure.  Once kidney function improves shared decision is to proceed with left and right heart catheterization with possible intervention prior to discharge.  IV heparin per ACS  protocol, pharmacy consult placed.  Continue dual antiplatelet therapy for now.  Shortness of breath: Most likely secondary to acute on chronic exacerbation of congestive heart failure, underlying ischemic cardiomyopathy.  See above for management.  History of ventricular tachycardia status post BiV ICD: Continue to monitor.  Benign essential hypertension: Blood pressure on arrival was uncontrolled.  Restart home medications and monitor for now.  Dyslipidemia: Continue statin therapy.  Most recent lipid profile reviewed.  Insulin-dependent diabetes mellitus type 2: Continue insulin coverage.  Consultants: None   Code Status: Full     Family Communication:   Wife was present during the encounter and plan of care discussed with both the patient and his wife Martin Bush who are in agreement.   Disposition Plan:  Home with his wife.   Patient's questions and concerns were addressed to his satisfaction. He voices understanding of the instructions provided during this encounter.   This note was created using a voice recognition software as a result there may be grammatical errors inadvertently enclosed that do not reflect the nature of this encounter. Every attempt is made to correct such errors.  Rex Kras, DO, [facility]. [place] Office: [phone] 12/09/2019, 3:51 PM

## 2019-12-09 NOTE — Progress Notes (Signed)
ANTICOAGULATION CONSULT NOTE - Initial Consult  Pharmacy Consult for heparin Indication: chest pain/ACS  Allergies  Allergen Reactions  . Diltiazem Rash    Patient Measurements:   Heparin Dosing Weight: 88.5 kg  Vital Signs: Temp: 95.6 F (35.3 C) (11/14 0234) Temp Source: Axillary (11/14 0234) BP: 99/64 (11/14 0700) Pulse Rate: 70 (11/14 0700)  Labs: Recent Labs    12/06/19 1431 12/09/19 0252 12/09/19 0252 12/09/19 0400 12/09/19 0538  HGB  --  15.0   < > 13.9 13.6  HCT  --  44.0  --  44.6 40.0  PLT  --   --   --  198  --   CREATININE 1.30*  --   --   --  1.60*  TROPONINIHS  --   --   --   --  584*   < > = values in this interval not displayed.    Estimated Creatinine Clearance: 53.8 mL/min (A) (by C-G formula based on SCr of 1.6 mg/dL (H)).   Medical History: Past Medical History:  Diagnosis Date  . AICD (automatic cardioverter/defibrillator) present    Medtronic  . Arthritis    "thumbs"  (08/02/2017)  . CHF (congestive heart failure) ([place])   . Chronic combined systolic and diastolic heart failure ([place])    a. 08/2017 Echo: EF 20-25%, mod glob HK. Sev distal ant sept, inflat HK. Apical AK. Gr2 DD. Mildly reduced RV fxn.  . Colon polyps   . Coronary artery disease    a. s/p CABG x 2 (LIMA->LAD, VG->OM); b. Multiple PCI's to LM/LCX/OM; c. 07/2017 PTCA of LM/LCX w/ early ISR-->repeat PCI/DES to LM (3.5x20 Synergy DES) and LCX (3.0x20 Synergy DES); d. 07/2018 Relook Cath: LM patent stent, LAD 100ost, LCX patent stent, OM1 99/60, LIMA->LAD ok. VG->dLCX 83 (old).  . Depression   . Encounter for assessment of implantable cardioverter-defibrillator (ICD) 09/27/2018  . High cholesterol   . Hypertension   . ICD; Biventricular  Medtronic ICD Amplia MRI QWuad CRT-D  in situ 10/29/14 10/29/2014   Remote ICD check 09.23.20:  One 6 beat NSVT. No therapy.  1 SVT episode @ 130 bpm (38 Sec).  There were 23 Vent sense episodes detected for up to 1.1 min/day (AT). Health trends  (patient activity, heart rate variability, average heart rates) are stable.Trans-thoracic impedance trends and the OptiVol Fluid Index do no present significant abnormalities. Battery longevity is 4.3 years. RA pacing is 3  . Ischemic cardiomyopathy 09/27/2018  . MI (myocardial infarction) ([place]) 2003  . NSTEMI (non-ST elevated myocardial infarction) ([place]) 07/16/2014  . OSA on CPAP   . Oxygen deficiency   . Pneumonia 10/2014  . Proteinuria   . Sleep apnea   . Type II diabetes mellitus (HCC)    insulin dependent    Medications:  See medication history  Assessment: 64 yo man to start heparin for CP.  He was not on anticoagulation PTA.  Hg 13.6, PTLC 198 Goal of Therapy:  Heparin level 0.3-0.7 units/ml Monitor platelets by anticoagulation protocol: Yes   Plan:  Heparin bolus 4000 units and drip at 1100 units/hr Check heparin level 6-8 hours after start Daily HL and CBC while on heparin Monitor for bleeding complications  Martin Bush 12/09/2019,7:19 AM

## 2019-12-09 NOTE — ED Notes (Signed)
Pt was taken off Bi-pap. Pt is on 5L [place]. Pt is resting comfortably. No distress.

## 2019-12-09 NOTE — ED Notes (Signed)
RN gave report to [employer], Therapist, sports

## 2019-12-09 NOTE — ED Notes (Signed)
LAV tube redraw sent to lab

## 2019-12-09 NOTE — ED Provider Notes (Signed)
[facility] EMERGENCY DEPARTMENT Provider Note   CSN: [id number] Arrival date & time: 12/09/19  0217   History Chief Complaint  Patient presents with  . Respiratory Distress    Martin Bush is a 64 y.o. male.  The history is provided by the patient and the EMS personnel. The history is limited by the condition of the patient (Respiratory distress).  He has history of hypertension, diabetes, hyperlipidemia, combined systolic and diastolic heart failure, implanted defibrillator, chronic kidney disease and comes in because of difficulty breathing which started tonight.  He denies chest pain, heaviness, tightness, pressure.  Denies cough.  Denies fever or chills.  EMS noted hypoxia with oxygen saturation of 83% and he was placed on oxygen via nonrebreather with improvement in oxygen saturations to 89%.  EMS also reported he was diaphoretic.  Unequal pupils were noted.  Past Medical History:  Diagnosis Date  . AICD (automatic cardioverter/defibrillator) present    Medtronic  . Arthritis    "thumbs"  (08/02/2017)  . CHF (congestive heart failure) ([place])   . Chronic combined systolic and diastolic heart failure ([place])    a. 08/2017 Echo: EF 20-25%, mod glob HK. Sev distal ant sept, inflat HK. Apical AK. Gr2 DD. Mildly reduced RV fxn.  . Colon polyps   . Coronary artery disease    a. s/p CABG x 2 (LIMA->LAD, VG->OM); b. Multiple PCI's to LM/LCX/OM; c. 07/2017 PTCA of LM/LCX w/ early ISR-->repeat PCI/DES to LM (3.5x20 Synergy DES) and LCX (3.0x20 Synergy DES); d. 07/2018 Relook Cath: LM patent stent, LAD 100ost, LCX patent stent, OM1 99/60, LIMA->LAD ok. VG->dLCX 21 (old).  . Depression   . Encounter for assessment of implantable cardioverter-defibrillator (ICD) 09/27/2018  . High cholesterol   . Hypertension   . ICD; Biventricular  Medtronic ICD Amplia MRI QWuad CRT-D  in situ 10/29/14 10/29/2014   Remote ICD check 09.23.20:  One 6 beat NSVT. No therapy.  1 SVT episode @ 130 bpm  (38 Sec).  There were 23 Vent sense episodes detected for up to 1.1 min/day (AT). Health trends (patient activity, heart rate variability, average heart rates) are stable.Trans-thoracic impedance trends and the OptiVol Fluid Index do no present significant abnormalities. Battery longevity is 4.3 years. RA pacing is 3  . Ischemic cardiomyopathy 09/27/2018  . MI (myocardial infarction) ([place]) 2003  . NSTEMI (non-ST elevated myocardial infarction) ([place]) 07/16/2014  . OSA on CPAP   . Oxygen deficiency   . Pneumonia 10/2014  . Proteinuria   . Sleep apnea   . Type II diabetes mellitus (HCC)    insulin dependent    Patient Active Problem List   Diagnosis Date Noted  . Skin irritation 10/10/2019  . Acute pulmonary edema ([place]) 10/09/2019  . Coronary artery disease involving native coronary artery of native heart with unstable angina pectoris ([place])   . Left main coronary artery disease   . Stenosis of left circumflex coronary artery   . Non-ST elevation (NSTEMI) myocardial infarction ([place]) 01/04/2019  . Hx of CABG 01/04/2019  . Acute respiratory failure ([place]) 01/04/2019  . 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 History of colonic polyps 11/12/2018  . Abnormal colonoscopy 11/12/2018  . Ischemic cardiomyopathy 09/27/2018  . Encounter for assessment of implantable cardioverter-defibrillator (ICD) 09/27/2018  . Acute on chronic combined systolic and diastolic CHF (congestive heart failure) ([place]) 09/27/2018  . Enrolled in clinical trial of drug 06/13/2018  . Weight loss 09/19/2017  . Ventricular tachycardia ([place])   . ICD (implantable cardioverter-defibrillator)  discharge 08/02/2017  . Obesity 06/17/2017  . CHF (congestive heart failure) ([place]) 07/23/2015  . Hyperlipidemia 07/11/2015  . HTN (hypertension), benign 07/11/2015  . Coronary artery disease involving autologous artery coronary bypass graft without angina pectoris 07/11/2015  . Depression 07/11/2015  . CKD (chronic kidney disease)  stage 3, GFR 30-59 ml/min (HCC) 07/11/2015  . OSA on CPAP 07/11/2015  . Lightheadedness 07/11/2015  . Carotid artery stenosis   . S/P eye surgery 05/15/2015  . Injury of left toe 04/27/2015  . Chronic low back pain 02/13/2015  . Insulin dependent type 2 diabetes mellitus ([place]) 02/13/2015  . Dizziness 02/05/2015  . ICD; Biventricular  Medtronic ICD Amplia MRI QWuad CRT-D  in situ 10/29/14 10/29/2014    Past Surgical History:  Procedure Laterality Date  . BIOPSY  09/19/2018   Procedure: BIOPSY;  Surgeon: Thornton Park, MD;  Location: [facility];  Service: Gastroenterology;;  . CARDIAC CATHETERIZATION N/A 07/16/2014   Procedure: Left Heart Cath and Coronary Angiography;  Surgeon: Adrian Prows, MD;  Location: [facility];  Service: Cardiovascular;  Laterality: N/A;  . CARDIAC CATHETERIZATION  07/16/2014   Procedure: Coronary Balloon Angioplasty;  Surgeon: Adrian Prows, MD;  Location: [place] [facility];  Service: Cardiovascular;;  . CARDIAC CATHETERIZATION  2003  . CARDIAC DEFIBRILLATOR PLACEMENT  2016  . CATARACT EXTRACTION W/ INTRAOCULAR LENS IMPLANT Left 03/2014  . COLONOSCOPY    . COLONOSCOPY WITH PROPOFOL N/A 09/19/2018   Procedure: COLONOSCOPY WITH PROPOFOL;  Surgeon: Thornton Park, MD;  Location: [facility];  Service: Gastroenterology;  Laterality: N/A;  . CORONARY ANGIOPLASTY WITH STENT PLACEMENT     "I've got a total of 8 stents in there; mostly doine at [facility]" (08/02/2017)  . CORONARY ARTERY BYPASS GRAFT  ~ 2003   "CABG X2"; [facility]  . CORONARY ATHERECTOMY N/A 08/16/2017   Procedure: CORONARY ATHERECTOMY;  Surgeon: Nigel Mormon, MD;  Location: [facility];  Service: Cardiovascular;  Laterality: N/A;  . CORONARY BALLOON ANGIOPLASTY N/A 08/04/2017   Procedure: CORONARY BALLOON ANGIOPLASTY;  Surgeon: Adrian Prows, MD;  Location: [place] [facility];  Service: Cardiovascular;  Laterality: N/A;  . CORONARY BALLOON ANGIOPLASTY N/A 01/11/2019    Procedure: CORONARY BALLOON ANGIOPLASTY;  Surgeon: Adrian Prows, MD;  Location: [place] [facility];  Service: Cardiovascular;  Laterality: N/A;  . CORONARY BALLOON ANGIOPLASTY N/A 10/09/2019   Procedure: CORONARY BALLOON ANGIOPLASTY;  Surgeon: Adrian Prows, MD;  Location: [place] [facility];  Service: Cardiovascular;  Laterality: N/A;  . CORONARY STENT INTERVENTION N/A 08/16/2017   Procedure: CORONARY STENT INTERVENTION;  Surgeon: Nigel Mormon, MD;  Location: [place] [facility];  Service: Cardiovascular;  Laterality: N/A;  . CORONARY STENT INTERVENTION N/A 10/09/2019   Procedure: CORONARY STENT INTERVENTION;  Surgeon: Adrian Prows, MD;  Location: [facility];  Service: Cardiovascular;  Laterality: N/A;  . ELBOW SURGERY Left ?2001   "pinched nerve"  . ENDOSCOPIC MUCOSAL RESECTION  12/18/2018   Procedure: ENDOSCOPIC MUCOSAL RESECTION;  Surgeon: Rush Landmark Telford Nab., MD;  Location: [facility] [facility];  Service: Gastroenterology;;  . Otho Darner SIGMOIDOSCOPY N/A 12/18/2018   Procedure: Beryle Quant;  Surgeon: Irving Copas., MD;  Location: [facility];  Service: Gastroenterology;  Laterality: N/A;  . HEMOSTASIS CLIP PLACEMENT  12/18/2018   Procedure: HEMOSTASIS CLIP PLACEMENT;  Surgeon: Irving Copas., MD;  Location: [facility];  Service: Gastroenterology;;  . LEFT HEART CATH AND CORONARY ANGIOGRAPHY N/A 01/11/2019   Procedure: LEFT HEART CATH AND CORONARY ANGIOGRAPHY;  Surgeon:  Adrian Prows, MD;  Location: [facility];  Service: Cardiovascular;  Laterality: N/A;  . LEFT HEART CATH AND CORS/GRAFTS ANGIOGRAPHY N/A 08/04/2017   Procedure: LEFT HEART CATH AND CORS/GRAFTS ANGIOGRAPHY;  Surgeon: Adrian Prows, MD;  Location: [place] [facility];  Service: Cardiovascular;  Laterality: N/A;  . LEFT HEART CATH AND CORS/GRAFTS ANGIOGRAPHY N/A 08/20/2017   Procedure: LEFT HEART CATH AND CORS/GRAFTS ANGIOGRAPHY;  Surgeon: Nigel Mormon, MD;  Location: [facility];   Service: Cardiovascular;  Laterality: N/A;  . LEFT HEART CATH AND CORS/GRAFTS ANGIOGRAPHY N/A 01/08/2019   Procedure: LEFT HEART CATH AND CORS/GRAFTS ANGIOGRAPHY;  Surgeon: [place] Hampshire, MD;  Location: [facility];  Service: Cardiovascular;  Laterality: N/A;  . LEFT HEART CATH AND CORS/GRAFTS ANGIOGRAPHY N/A 10/09/2019   Procedure: LEFT HEART CATH AND CORS/GRAFTS ANGIOGRAPHY;  Surgeon: Adrian Prows, MD;  Location: [facility];  Service: Cardiovascular;  Laterality: N/A;  . POLYPECTOMY  09/19/2018   Procedure: POLYPECTOMY;  Surgeon: Thornton Park, MD;  [facility];  Service: Gastroenterology;;  . Lia Foyer INJECTION  09/19/2018   Procedure: SUBMUCOSAL INJECTION;  Surgeon: Thornton Park, MD;  [facility]: [facility];  Service: Gastroenterology;;  . Lia Foyer LIFTING INJECTION  12/18/2018   Procedure: SUBMUCOSAL LIFTING INJECTION;  Surgeon: Irving Copas., MD;  Location: [facility];  Service: Gastroenterology;;       Family History  Problem Relation Age of Onset  . Uterine cancer Mother   . Lung cancer Mother   . Hyperlipidemia Father   . Heart disease Father   . Hypertension Father   . Diabetes Father   . Colon cancer Neg Hx   . Esophageal cancer Neg Hx   . Colon polyps Neg Hx   . Rectal cancer Neg Hx   . Stomach cancer Neg Hx   . Inflammatory bowel disease Neg Hx   . Liver disease Neg Hx   . Pancreatic cancer Neg Hx     Social History   Tobacco Use  . Smoking status: Former Smoker    Packs/day: 0.25    Years: 27.00    Pack years: 6.75    Types: Cigars    Quit date: 2002    Years since quitting: 19.8  . Smokeless tobacco: Former Systems developer    Quit date: 2002  Vaping Use  . Vaping Use: Never used  Substance Use Topics  . Alcohol use: Not Currently  . Drug use: Never    Home Medications Prior to Admission medications   Medication Sig Start Date End Date Taking? Authorizing Provider  amiodarone (PACERONE) 200 MG tablet Take  0.5 tablets (100 mg total) by mouth daily. 05/28/19   Adrian Prows, MD  aspirin EC 81 MG EC tablet Take 1 tablet (81 mg total) by mouth daily. 01/08/19   Nolberto Hanlon, MD  carvedilol (COREG) 12.5 MG tablet Take 1 tablet (12.5 mg total) by mouth 2 (two) times daily. 10/31/19 10/25/20  Cantwell, Celeste C, PA-C  Cholecalciferol (VITAMIN D3) 50 MCG (2000 UT) TABS Take 2,000 Units by mouth 2 (two) times daily.     [provider]  Cyanocobalamin 2500 MCG CHEW Chew 1 tablet by mouth daily.    [provider]  insulin aspart protamine- aspart (NOVOLOG MIX 70/30) (70-30) 100 UNIT/ML injection Inject 0.66 mLs (66 Units total) into the skin daily with breakfast AND 0.6 mLs (60 Units total) daily with supper. Patient taking differently: Inject 0.6 mLs (60 Units total) into the skin daily with breakfast  AND 0.6 mLs (60 Units total) daily with supper. 10/10/19   [name], MD  isosorbide mononitrate (IMDUR) 30 MG 24 hr tablet Take 1 tablet (30 mg total) by mouth daily. 01/08/19   Nolberto Hanlon, MD  liraglutide (VICTOZA) 18 MG/3ML SOPN Inject 1.8 mg into the skin daily.    [provider]  metFORMIN (GLUCOPHAGE) 500 MG tablet Take 2 tablets (1,000 mg total) by mouth 2 (two) times daily with a meal. Taking 2 tabs twice a day Patient taking differently: Take 1,000 mg by mouth 2 (two) times daily with a meal.  10/12/19   Adrian Prows, MD  nitroGLYCERIN (NITROSTAT) 0.4 MG SL tablet Place 1 tablet (0.4 mg total) under the tongue every 5 (five) minutes x 3 doses as needed for chest pain. 01/08/19   Nolberto Hanlon, MD  Omega-3 Fatty Acids (FISH OIL) 1000 MG CAPS Take 1,000 mg by mouth 2 (two) times daily.     [provider]  potassium chloride SA (KLOR-CON) 20 MEQ tablet TAKE 1 TABLET BY MOUTH TWICE A DAY 11/29/19   Adrian Prows, MD  PRESCRIPTION MEDICATION Inhale into the lungs at bedtime. CPAP    [provider]  rosuvastatin (CRESTOR) 40 MG tablet Take 1 tablet (40 mg total) by  mouth at bedtime. 01/08/19   Nolberto Hanlon, MD  sacubitril-valsartan (ENTRESTO) 97-103 MG Take 1 tablet by mouth 2 (two) times daily. Patient taking differently: Take 0.5 tablets by mouth 2 (two) times daily.  11/22/19   Adrian Prows, MD  sertraline (ZOLOFT) 50 MG tablet Take 1 tablet (50 mg total) by mouth at bedtime. Patient taking differently: Take 100 mg by mouth at bedtime.  01/08/19   Nolberto Hanlon, MD  ticagrelor (BRILINTA) 90 MG TABS tablet Take 1 tablet (90 mg total) by mouth 2 (two) times daily. 01/08/19   Nolberto Hanlon, MD  torsemide (DEMADEX) 20 MG tablet TAKE 1 TABLET BY MOUTH TWICE A DAY 10/26/19   Adrian Prows, MD    Allergies    Diltiazem  Review of Systems   Review of Systems  Unable to perform ROS: Severe respiratory distress    Physical Exam Updated Vital Signs BP (!) 177/104 (BP Location: Left Arm)   Pulse 88   Temp (!) 95.6 F (35.3 C) (Axillary)   Resp (!) 26   SpO2 (!) 89%   Physical Exam Vitals and nursing note reviewed.   64 year old male, in moderate to severe respiratory distress. Vital signs are significant for elevated respiratory rate and blood pressure. Oxygen saturation is 89%, which is hypoxic.  He is using accessory muscles of respiration. Head is normocephalic and atraumatic. PERRLA, EOMI. Oropharynx is clear. Neck is nontender and supple without adenopathy or JVD. Back is nontender and there is no CVA tenderness.  There is 1+ presacral edema. Lungs have diffuse rales without wheezes or rhonchi. Chest is nontender.  Implanted defibrillator present in the left subclavian area. Heart has regular rate and rhythm without murmur. Abdomen is soft, flat, nontender without masses or hepatosplenomegaly and peristalsis is normoactive. Extremities have 2+ pretibial edema, full range of motion is present. Skin is warm and mildly diaphoretic without rash. Neurologic: Drowsy but easily arousable, oriented x4, cranial nerves are intact, there are no motor or sensory  deficits.  ED Results / Procedures / Treatments   Labs (all labs ordered are listed, but only abnormal results are displayed) Labs Reviewed  RESPIRATORY PANEL BY RT PCR (FLU A&B, COVID)  BASIC METABOLIC PANEL  BRAIN  NATRIURETIC PEPTIDE  CBC WITH DIFFERENTIAL/PLATELET  I-STAT ARTERIAL BLOOD GAS, ED  TROPONIN I (HIGH SENSITIVITY)    EKG EKG Interpretation  Date/Time:  Sunday December 09 2019 02:22:20 EST Ventricular Rate:  99 PR Interval:    QRS Duration: 189 QT Interval:  441 QTC Calculation: 566 R Axis:   -91 Text Interpretation: Atrial-sensed ventricular-paced rhythm When compared with ECG of 10/09/2019, No significant change was found Confirmed by Delora Fuel ([id number]) on 12/09/2019 2:31:49 AM   Radiology DG Chest Port 1 View  Result Date: 12/09/2019 CLINICAL DATA:  Respiratory distress EXAM: PORTABLE CHEST 1 VIEW COMPARISON:  10/08/2019 FINDINGS: The lungs are symmetrically well expanded. There are bilateral perihilar airspace infiltrates present, as well as trace right basilar peripheral interstitial pulmonary infiltrate most in keeping with moderate mixed alveolar and interstitial pulmonary edema, likely cardiogenic in nature. There is no pneumothorax or pleural effusion. Coronary artery bypass grafting and coronary artery stenting has been performed. Mild to moderate cardiomegaly is stable. Left subclavian pacemaker defibrillator is unchanged. IMPRESSION: Bilateral perihilar and lower lung zone pulmonary infiltrates most in keeping with moderate probable cardiogenic pulmonary edema. Electronically Signed   By: Fidela Salisbury MD   On: 12/09/2019 02:41    Procedures Procedures  CRITICAL CARE Performed by: Delora Fuel Total critical care time: 155 minutes Critical care time was exclusive of separately billable procedures and treating other patients. Critical care was necessary to treat or prevent imminent or life-threatening deterioration. Critical care was time spent  personally by me on the following activities: development of treatment plan with patient and/or surrogate as well as nursing, discussions with consultants, evaluation of patient's response to treatment, examination of patient, obtaining history from patient or surrogate, ordering and performing treatments and interventions, ordering and review of laboratory studies, ordering and review of radiographic studies, pulse oximetry and re-evaluation of patient's condition.  Medications Ordered in ED Medications  furosemide (LASIX) injection 40 mg (has no administration in time range)  nitroGLYCERIN (NITROGLYN) 2 % ointment 1 inch (has no administration in time range)    ED Course  I have reviewed the triage vital signs and the nursing notes.  Pertinent labs & imaging results that were available during my care of the patient were reviewed by me and considered in my medical decision making (see chart for details).  MDM Rules/Calculators/A&P Respiratory distress which appears to be secondary to pulmonary edema.  No cough or fever to suggest pneumonia.  No risk factors for pulmonary embolism.  Chest x-ray is consistent with pulmonary edema.  ECG shows paced rhythm.  He is placed on BiPAP and is given intravenous furosemide and also topical nitroglycerin.  Old records are reviewed, and he has several prior admissions for acute pulmonary edema with most recent 19/13/2021.  3:19 AM ABG shows respiratory acidosis with hypercarbia.  However, with his time on BiPAP, patient is getting more alert.  Will recheck ABG after 1 hour on BiPAP to see if respiratory acidosis is improving.  Repeat ABG showed PCO2 down to 43, pH up to 7.35.  He is maintained on BiPAP.  Creatinine is elevated and slightly worse than baseline.  BNP is elevated, but not as high as it was at hospitalization 2 months ago.  Troponin is moderately elevated, slightly higher than it was previously.  Case is discussed with Dr. Terri Skains of cardiology  service who agrees to admit the patient.  Final Clinical Impression(s) / ED Diagnoses Final diagnoses:  Acute pulmonary edema ([place])  Acute respiratory failure with hypoxia  and hypercapnia ([place])  Elevated troponin  Renal insufficiency    Rx / DC Orders ED Discharge Orders    None       Delora Fuel, MD 38/87/19 [phone]

## 2019-12-09 NOTE — Progress Notes (Signed)
[name] for heparin Indication: chest pain/ACS  Allergies  Allergen Reactions  . Diltiazem Rash    Patient Measurements: Height: 5\' 7"  (170.2 cm) Weight: 98.9 kg (218 lb) IBW/kg (Calculated) : 66.1 Heparin Dosing Weight: 88.5 kg  Vital Signs: Temp: 99 F (37.2 C) (11/14 1935) Temp Source: Oral (11/14 1935) BP: 132/69 (11/14 1935) Pulse Rate: 86 (11/14 1935)  Labs: Recent Labs    12/09/19 0400 12/09/19 0400 12/09/19 0538 12/09/19 1553 12/09/19 1756  HGB 13.9   < > 13.6 12.3*  --   HCT 44.6  --  40.0 38.6*  --   PLT 198  --   --  230  --   HEPARINUNFRC  --   --   --   --  <0.10*  CREATININE  --   --  1.60* 1.47*  --   TROPONINIHS  --   --  584* 1,728* 1,620*   < > = values in this interval not displayed.    Estimated Creatinine Clearance: 57.6 mL/min (A) (by C-G formula based on SCr of 1.47 mg/dL (H)).  Assessment: 35 yom presented to the ED with CP. Started on IV heparin. Initial heparin level is undetectable. Heparin was off briefly this afternoon when the physician dc'd the order but re-ordered the consult shortly thereafter. Pt has much higher heparin dosing requirements in the past. No bleeding noted.   Goal of Therapy:  Heparin level 0.3-0.7 units/ml Monitor platelets by anticoagulation protocol: Yes   Plan:  Re-bolus heparin 4000 units IV x 1 Increase heparin gtt to 1500 units/hr - has been therapeutic on this within the last year Check a 6 hr heparin level Daily heparin level and CBC  Martin Bush, Martin Bush 12/09/2019,8:02 PM

## 2019-12-09 NOTE — Telephone Encounter (Signed)
Patient's wife called office on-call to inform that patient being taken via EMS to [facility] ED for worsening shortness of breath. Informed her that our hospital  on-call provider will be made aware.

## 2019-12-09 NOTE — ED Triage Notes (Signed)
SHOB X 2-3 HRS. EMS reports 83% on RA, 89% on nonrebreather. Respirations are even and labored. Skin cool and clammy. No hx of CHF, taking fluids pills as prescribed. +2 pitting edema noted to BLE.

## 2019-12-09 NOTE — Progress Notes (Signed)
Pt refused cpap for tonight. Pt stated wife will bring his home cpap tomorrow.

## 2019-12-09 NOTE — Progress Notes (Signed)
Pt taken off bipap and placed on 3L [place] at this time with no complications. Pt denies SOB, no increased WOB, VS WNL. Pt wears cpap QHS with O2 bled in. Pt's wife to bring in home cpap later.

## 2019-12-09 NOTE — ED Notes (Signed)
Date and time results received: 12/09/19  6:51 AM   Test: Toponin Critical Value: 481  Name of Provider Notified: Dr. Roxanne Mins

## 2019-12-09 NOTE — ED Notes (Signed)
RN attempted to call floor. Floor RN stated she will call me back.

## 2019-12-09 NOTE — ED Notes (Signed)
Patient states that he feels better at this time, respirations even, nonlabored, respiratory rate of 22. SpO2 100% on BiPAP.

## 2019-12-10 DIAGNOSIS — G4733 Obstructive sleep apnea (adult) (pediatric): Secondary | ICD-10-CM | POA: Diagnosis present

## 2019-12-10 DIAGNOSIS — Z955 Presence of coronary angioplasty implant and graft: Secondary | ICD-10-CM | POA: Diagnosis not present

## 2019-12-10 DIAGNOSIS — Z8679 Personal history of other diseases of the circulatory system: Secondary | ICD-10-CM | POA: Diagnosis not present

## 2019-12-10 DIAGNOSIS — J9601 Acute respiratory failure with hypoxia: Secondary | ICD-10-CM | POA: Diagnosis present

## 2019-12-10 DIAGNOSIS — I472 Ventricular tachycardia: Secondary | ICD-10-CM | POA: Diagnosis present

## 2019-12-10 DIAGNOSIS — M19042 Primary osteoarthritis, left hand: Secondary | ICD-10-CM | POA: Diagnosis present

## 2019-12-10 DIAGNOSIS — Z8249 Family history of ischemic heart disease and other diseases of the circulatory system: Secondary | ICD-10-CM | POA: Diagnosis not present

## 2019-12-10 DIAGNOSIS — E1122 Type 2 diabetes mellitus with diabetic chronic kidney disease: Secondary | ICD-10-CM | POA: Diagnosis present

## 2019-12-10 DIAGNOSIS — Z20822 Contact with and (suspected) exposure to covid-19: Secondary | ICD-10-CM | POA: Diagnosis present

## 2019-12-10 DIAGNOSIS — E785 Hyperlipidemia, unspecified: Secondary | ICD-10-CM | POA: Diagnosis present

## 2019-12-10 DIAGNOSIS — F32A Depression, unspecified: Secondary | ICD-10-CM | POA: Diagnosis present

## 2019-12-10 DIAGNOSIS — J810 Acute pulmonary edema: Secondary | ICD-10-CM | POA: Diagnosis present

## 2019-12-10 DIAGNOSIS — I252 Old myocardial infarction: Secondary | ICD-10-CM | POA: Diagnosis not present

## 2019-12-10 DIAGNOSIS — Z95810 Presence of automatic (implantable) cardiac defibrillator: Secondary | ICD-10-CM | POA: Diagnosis not present

## 2019-12-10 DIAGNOSIS — I255 Ischemic cardiomyopathy: Secondary | ICD-10-CM | POA: Diagnosis present

## 2019-12-10 DIAGNOSIS — Z951 Presence of aortocoronary bypass graft: Secondary | ICD-10-CM | POA: Diagnosis not present

## 2019-12-10 DIAGNOSIS — I5043 Acute on chronic combined systolic (congestive) and diastolic (congestive) heart failure: Secondary | ICD-10-CM | POA: Diagnosis present

## 2019-12-10 DIAGNOSIS — I130 Hypertensive heart and chronic kidney disease with heart failure and stage 1 through stage 4 chronic kidney disease, or unspecified chronic kidney disease: Secondary | ICD-10-CM | POA: Diagnosis present

## 2019-12-10 DIAGNOSIS — E872 Acidosis: Secondary | ICD-10-CM | POA: Diagnosis present

## 2019-12-10 DIAGNOSIS — Z86010 Personal history of colonic polyps: Secondary | ICD-10-CM | POA: Diagnosis not present

## 2019-12-10 DIAGNOSIS — J9602 Acute respiratory failure with hypercapnia: Secondary | ICD-10-CM | POA: Diagnosis present

## 2019-12-10 DIAGNOSIS — M19041 Primary osteoarthritis, right hand: Secondary | ICD-10-CM | POA: Diagnosis present

## 2019-12-10 DIAGNOSIS — N1831 Chronic kidney disease, stage 3a: Secondary | ICD-10-CM | POA: Diagnosis present

## 2019-12-10 DIAGNOSIS — I25118 Atherosclerotic heart disease of native coronary artery with other forms of angina pectoris: Secondary | ICD-10-CM | POA: Diagnosis present

## 2019-12-10 DIAGNOSIS — I471 Supraventricular tachycardia: Secondary | ICD-10-CM | POA: Diagnosis present

## 2019-12-10 LAB — BASIC METABOLIC PANEL
Anion gap: 8 (ref 5–15)
BUN: 27 mg/dL — ABNORMAL HIGH (ref 8–23)
CO2: 25 mmol/L (ref 22–32)
Calcium: 8.6 mg/dL — ABNORMAL LOW (ref 8.9–10.3)
Chloride: 100 mmol/L (ref 98–111)
Creatinine, Ser: 1.66 mg/dL — ABNORMAL HIGH (ref 0.61–1.24)
GFR, Estimated: 46 mL/min — ABNORMAL LOW (ref >60)
Glucose, Bld: 283 mg/dL — ABNORMAL HIGH (ref 70–99)
Potassium: 3.9 mmol/L (ref 3.5–5.1)
Sodium: 133 mmol/L — ABNORMAL LOW (ref 135–145)

## 2019-12-10 LAB — GLUCOSE, CAPILLARY
Glucose-Capillary: 258 mg/dL — ABNORMAL HIGH (ref 70–99)
Glucose-Capillary: 299 mg/dL — ABNORMAL HIGH (ref 70–99)
Glucose-Capillary: 79 mg/dL (ref 70–99)
Glucose-Capillary: 133 mg/dL — ABNORMAL HIGH (ref 70–99)

## 2019-12-10 LAB — CBC
HCT: 35.3 % — ABNORMAL LOW (ref 39.0–52.0)
Hemoglobin: 11.2 g/dL — ABNORMAL LOW (ref 13.0–17.0)
MCH: 29.2 pg (ref 26.0–34.0)
MCHC: 31.7 g/dL (ref 30.0–36.0)
MCV: 92.2 fL (ref 80.0–100.0)
Platelets: 176 10*3/uL (ref 150–400)
RBC: 3.83 MIL/uL — ABNORMAL LOW (ref 4.22–5.81)
RDW: 13.3 % (ref 11.5–15.5)
WBC: 11.2 10*3/uL — ABNORMAL HIGH (ref 4.0–10.5)
nRBC: 0 % (ref 0.0–0.2)

## 2019-12-10 LAB — HEPARIN LEVEL (UNFRACTIONATED): Heparin Unfractionated: 0.47 IU/mL (ref 0.30–0.70)

## 2019-12-10 MED ORDER — SODIUM CHLORIDE 0.9 % IV SOLN: 250.0000 mL | INTRAVENOUS | Status: DC | PRN

## 2019-12-10 MED ORDER — SODIUM CHLORIDE 0.9 % IV SOLN: INTRAVENOUS | Status: DC

## 2019-12-10 MED ORDER — SODIUM CHLORIDE 0.9% FLUSH
3.0000 mL | Freq: Two times a day (BID) | INTRAVENOUS | Status: DC
  Administered 2019-12-11: 3 mL via INTRAVENOUS

## 2019-12-10 MED ORDER — SODIUM CHLORIDE 0.9% FLUSH: 3.0000 mL | INTRAVENOUS | Status: DC | PRN

## 2019-12-10 NOTE — Progress Notes (Signed)
   12/09/19 1935  Assess: MEWS Score  Temp 99 F (37.2 C)  BP 132/69  Pulse Rate 86  ECG Heart Rate 86  Resp 17  Level of Consciousness Alert  SpO2 95 %  O2 Device Nasal Cannula  Patient Activity (if Appropriate) In bed  O2 Flow Rate (L/min) 3 L/min  Assess: MEWS Score  MEWS Temp 0  MEWS Systolic 0  MEWS Pulse 0  MEWS RR 0  MEWS LOC 0  MEWS Score 0  MEWS Score Color Green  Treat  Pain Scale 0-10  Pain Score 0

## 2019-12-10 NOTE — Plan of Care (Signed)
Pt's respiratory status improving on 3L of O2. Pt able to use call bell for need but is is independent in his ADLs.

## 2019-12-10 NOTE — H&P (View-Only) (Signed)
Primary Physician/Referring:  [name], MD  Patient ID: [name], male    DOB: [birth year], 64 y.o.   MRN: [id number]  Chief Complaint  Patient presents with  . Respiratory Distress   History   Fritz Cauthon  is a 64 y.o. Caucasian male with CAD and history of CABG x2 in 2002  with only LIMA to LAD being patent, Circumflex coronary artery is super dominant. History of stenting to circumflex/OM bifurcation due to recurrent restenosis in 2015, left main and circumflex proximal and midsegment in 2016, angioplasty to left main and proximal and mid circumflex in 2020 and again 10/08/2019.  He also has a history of VT with ICD, ischemic cardiomyopathy, chronic systolic and diastolic heart failure.  He was admitted to the hospital on 10/08/2019 with acute pulmonary edema and non-ST elevation MI for which he underwent repeat angioplasty and stenting to left main and left circumflex coronary arteries.  He was admitted with acute onset pulmonary edema yesterday and needing BiPAP support and diuresis.  This morning he feels well, states that he is able to lay down flat in bed without any distress.  States that his leg edema is also improved.  He has not had any palpitations or chest discomfort.  Past Medical History:  Diagnosis Date  . AICD (automatic cardioverter/defibrillator) present    Medtronic  . Arthritis    "thumbs"  (08/02/2017)  . CHF (congestive heart failure) ([place])   . Chronic combined systolic and diastolic heart failure ([address])    a. 08/2017 Echo: EF 20-25%, mod glob HK. Sev distal ant sept, inflat HK. Apical AK. Gr2 DD. Mildly reduced RV fxn.  . Colon polyps   . Coronary artery disease    a. s/p CABG x 2 (LIMA->LAD, VG->OM); b. Multiple PCI's to LM/LCX/OM; c. 07/2017 PTCA of LM/LCX w/ early ISR-->repeat PCI/DES to LM (3.5x20 Synergy DES) and LCX (3.0x20 Synergy DES); d. 07/2018 Relook Cath: LM patent stent, LAD 100ost, LCX patent stent, OM1 99/60, LIMA->LAD ok. VG->dLCX 59  (old).  . Depression   . Encounter for assessment of implantable cardioverter-defibrillator (ICD) 09/27/2018  . High cholesterol   . Hypertension   . ICD; Biventricular  Medtronic ICD Amplia MRI QWuad CRT-D  in situ 10/29/14 10/29/2014   Remote ICD check 09.23.20:  One 6 beat NSVT. No therapy.  1 SVT episode @ 130 bpm (38 Sec).  There were 23 Vent sense episodes detected for up to 1.1 min/day (AT). Health trends (patient activity, heart rate variability, average heart rates) are stable.Trans-thoracic impedance trends and the OptiVol Fluid Index do no present significant abnormalities. Battery longevity is 4.3 years. RA pacing is 3  . Ischemic cardiomyopathy 09/27/2018  . MI (myocardial infarction) ([place]) 2003  . NSTEMI (non-ST elevated myocardial infarction) ([place]) 07/16/2014  . OSA on CPAP   . Oxygen deficiency   . Pneumonia 10/2014  . Proteinuria   . Sleep apnea   . Type II diabetes mellitus (HCC)    insulin dependent   Past Surgical History:  Procedure Laterality Date  . BIOPSY  09/19/2018   Procedure: BIOPSY;  Surgeon: Thornton Park, MD;  Location: [facility];  Service: Gastroenterology;;  . CARDIAC CATHETERIZATION N/A 07/16/2014   Procedure: Left Heart Cath and Coronary Angiography;  Surgeon: Adrian Prows, MD;  Location: [place] [facility];  Service: Cardiovascular;  Laterality: N/A;  . CARDIAC CATHETERIZATION  07/16/2014   Procedure: Coronary Balloon Angioplasty;  Surgeon: Adrian Prows, MD;  Location: [place] [facility];  Service: Cardiovascular;;  .  CARDIAC CATHETERIZATION  2003  . CARDIAC DEFIBRILLATOR PLACEMENT  2016  . CATARACT EXTRACTION W/ INTRAOCULAR LENS IMPLANT Left 03/2014  . COLONOSCOPY    . COLONOSCOPY WITH PROPOFOL N/A 09/19/2018   Procedure: COLONOSCOPY WITH PROPOFOL;  Surgeon: Thornton Park, MD;  [facility]: [facility];  Service: Gastroenterology;  Laterality: N/A;  . CORONARY ANGIOPLASTY WITH STENT PLACEMENT     "I've got a total of 8 stents in there; mostly doine at  [facility]" (08/02/2017)  . CORONARY ARTERY BYPASS GRAFT  ~ 2003   "CABG X2"; [facility]  . CORONARY ATHERECTOMY N/A 08/16/2017   Procedure: CORONARY ATHERECTOMY;  Surgeon: Nigel Mormon, MD;  Location: [place] [facility];  Service: Cardiovascular;  Laterality: N/A;  . CORONARY BALLOON ANGIOPLASTY N/A 08/04/2017   Procedure: CORONARY BALLOON ANGIOPLASTY;  Surgeon: Adrian Prows, MD;  Location: [place] [facility];  Service: Cardiovascular;  Laterality: N/A;  . CORONARY BALLOON ANGIOPLASTY N/A 01/11/2019   Procedure: CORONARY BALLOON ANGIOPLASTY;  Surgeon: Adrian Prows, MD;  Location: [place] [facility];  Service: Cardiovascular;  Laterality: N/A;  . CORONARY BALLOON ANGIOPLASTY N/A 10/09/2019   Procedure: CORONARY BALLOON ANGIOPLASTY;  Surgeon: Adrian Prows, MD;  Location: [place] [facility];  Service: Cardiovascular;  Laterality: N/A;  . CORONARY STENT INTERVENTION N/A 08/16/2017   Procedure: CORONARY STENT INTERVENTION;  Surgeon: Nigel Mormon, MD;  Location: [place] [facility];  Service: Cardiovascular;  Laterality: N/A;  . CORONARY STENT INTERVENTION N/A 10/09/2019   Procedure: CORONARY STENT INTERVENTION;  Surgeon: Adrian Prows, MD;  Location: [place] [facility];  Service: Cardiovascular;  Laterality: N/A;  . ELBOW SURGERY Left ?2001   "pinched nerve"  . ENDOSCOPIC MUCOSAL RESECTION  12/18/2018   Procedure: ENDOSCOPIC MUCOSAL RESECTION;  Surgeon: Rush Landmark Telford Nab., MD;  Location: [place];  Service: Gastroenterology;;  . Otho Darner SIGMOIDOSCOPY N/A 12/18/2018   Procedure: Beryle Quant;  Surgeon: Irving Copas., MD;  Location: [place];  Service: Gastroenterology;  Laterality: N/A;  . HEMOSTASIS CLIP PLACEMENT  12/18/2018   Procedure: HEMOSTASIS CLIP PLACEMENT;  Surgeon: Irving Copas., MD;  Location: [place];  Service: Gastroenterology;;  . LEFT HEART CATH AND CORONARY ANGIOGRAPHY N/A 01/11/2019   Procedure: LEFT HEART CATH AND CORONARY  ANGIOGRAPHY;  Surgeon: Adrian Prows, MD;  Location: [place] [facility];  Service: Cardiovascular;  Laterality: N/A;  . LEFT HEART CATH AND CORS/GRAFTS ANGIOGRAPHY N/A 08/04/2017   Procedure: LEFT HEART CATH AND CORS/GRAFTS ANGIOGRAPHY;  Surgeon: Adrian Prows, MD;  Location: [place] [facility];  Service: Cardiovascular;  Laterality: N/A;  . LEFT HEART CATH AND CORS/GRAFTS ANGIOGRAPHY N/A 08/20/2017   Procedure: LEFT HEART CATH AND CORS/GRAFTS ANGIOGRAPHY;  Surgeon: Nigel Mormon, MD;  Location: [place] [facility];  Service: Cardiovascular;  Laterality: N/A;  . LEFT HEART CATH AND CORS/GRAFTS ANGIOGRAPHY N/A 01/08/2019   Procedure: LEFT HEART CATH AND CORS/GRAFTS ANGIOGRAPHY;  Surgeon: Wellington Hampshire, MD;  Location: [place] [facility];  Service: Cardiovascular;  Laterality: N/A;  . LEFT HEART CATH AND CORS/GRAFTS ANGIOGRAPHY N/A 10/09/2019   Procedure: LEFT HEART CATH AND CORS/GRAFTS ANGIOGRAPHY;  Surgeon: Adrian Prows, MD;  Location: [place] [facility];  Service: Cardiovascular;  Laterality: N/A;  . POLYPECTOMY  09/19/2018   Procedure: POLYPECTOMY;  Surgeon: Thornton Park, MD;  [facility];  Service: Gastroenterology;;  . Lia Foyer INJECTION  09/19/2018   Procedure: SUBMUCOSAL INJECTION;  Surgeon: Thornton Park, MD;  [facility];  Service: Gastroenterology;;  . Lia Foyer LIFTING INJECTION  12/18/2018  Procedure: SUBMUCOSAL LIFTING INJECTION;  Surgeon: Rush Landmark Telford Nab., MD;  Location: [facility];  Service: Gastroenterology;;    Family History  Problem Relation Age of Onset  . Uterine cancer Mother   . Lung cancer Mother   . Hyperlipidemia Father   . Heart disease Father   . Hypertension Father   . Diabetes Father   . Colon cancer Neg Hx   . Esophageal cancer Neg Hx   . Colon polyps Neg Hx   . Rectal cancer Neg Hx   . Stomach cancer Neg Hx   . Inflammatory bowel disease Neg Hx   . Liver disease Neg Hx   . Pancreatic cancer Neg Hx    Social  History   Tobacco Use  . Smoking status: Former Smoker    Packs/day: 0.25    Years: 27.00    Pack years: 6.75    Types: Cigars    Quit date: 2002    Years since quitting: 19.8  . Smokeless tobacco: Former Systems developer    Quit date: 2002  Substance Use Topics  . Alcohol use: Not Currently    Marital Status: Married ROS  Review of Systems  Cardiovascular: Positive for dyspnea on exertion, leg swelling and orthopnea. Negative for chest pain, claudication, palpitations, paroxysmal nocturnal dyspnea and syncope.  Hematologic/Lymphatic: Does not bruise/bleed easily.  Gastrointestinal: Negative for melena.  Neurological: Negative for dizziness.  All other systems reviewed and are negative.  Objective  Blood pressure 116/74, pulse 77, temperature 98.5 F (36.9 C), temperature source Oral, resp. rate 17, height 5\' 7"  (1.702 m), weight 99.3 kg, SpO2 95 %.  Vitals with BMI 12/10/2019 12/10/2019 12/10/2019  Height - - -  Weight - - 219 lbs  BMI - - 78.29  Systolic 562 130 865  Diastolic 74 70 88  Pulse 77 83 97     Physical Exam Vitals reviewed.  Constitutional:      General: He is not in acute distress.    Appearance: He is obese.     Comments: He is moderately built and mildly obese in no acute distress.  Cardiovascular:     Rate and Rhythm: Normal rate and regular rhythm.     Pulses:          Carotid pulses are 1+ on the right side with bruit and 1+ on the left side with bruit.      Femoral pulses are 2+ on the right side and 2+ on the left side.      Popliteal pulses are 1+ on the right side and 1+ on the left side.       Dorsalis pedis pulses are 0 on the right side and 0 on the left side.       Posterior tibial pulses are 1+ on the right side and 0 on the left side.     Heart sounds: Normal heart sounds.     Comments: No JVD.  1+ pitting edema below knee bilateral  Pulmonary:     Effort: Pulmonary effort is normal. No accessory muscle usage or respiratory distress.     Breath  sounds: Normal breath sounds.  Musculoskeletal:        General: Swelling present. Normal range of motion.  Neurological:     General: No focal deficit present.     Mental Status: He is alert and oriented to person, place, and time.    Laboratory examination:   Recent Labs    10/09/19 0558 10/23/19 1347 10/23/19 1347 11/14/19 1514 11/14/19  1514 12/06/19 1431 12/06/19 1431 12/09/19 0252 12/09/19 0538 12/09/19 1553 12/10/19 0218  NA   < > 136   < > 138   < > 141  --  138 134*  136  --  133*  K   < > 4.6   < > 4.8   < > 5.1  --  4.7 4.9  4.7  --  3.9  CL  --  100   < > 102   < > 103  --   --  104  --  100  CO2  --  20   < > 21   < > 24  --   --  18*  --  25  GLUCOSE  --  356*   < > 362*   < > 377*  --   --  342*  --  283*  BUN  --  28*   < > 24   < > 17  --   --  27*  --  27*  CREATININE  --  1.32*   < > 1.43*   < > 1.30*   < >  --  1.60* 1.47* 1.66*  CALCIUM  --  9.4   < > 9.3   < > 8.9  --   --  8.7*  --  8.6*  GFRNONAA  --  57*   < > 52*   < > 58*  --   --  48* 53* 46*  GFRAA  --  66  --  60  --  67  --   --   --   --   --    < > = values in this interval not displayed.   estimated creatinine clearance is 51.2 mL/min (A) (by C-G formula based on SCr of 1.66 mg/dL (H)).  CMP Latest Ref Rng & Units 12/10/2019 12/09/2019 12/09/2019  Glucose 70 - 99 mg/dL 283(H) - 342(H)  BUN 8 - 23 mg/dL 27(H) - 27(H)  Creatinine 0.61 - 1.24 mg/dL 1.66(H) 1.47(H) 1.60(H)  Sodium 135 - 145 mmol/L 133(L) - 134(L)  Potassium 3.5 - 5.1 mmol/L 3.9 - 4.9  Chloride 98 - 111 mmol/L 100 - 104  CO2 22 - 32 mmol/L 25 - 18(L)  Calcium 8.9 - 10.3 mg/dL 8.6(L) - 8.7(L)  Total Protein 6.5 - 8.1 g/dL - - -  Total Bilirubin 0.3 - 1.2 mg/dL - - -  Alkaline Phos 38 - 126 U/L - - -  AST 15 - 41 U/L - - -  ALT 0 - 44 U/L - - -   CBC Latest Ref Rng & Units 12/10/2019 12/09/2019 12/09/2019  WBC 4.0 - 10.5 K/uL 11.2(H) 10.3 -  Hemoglobin 13.0 - 17.0 g/dL 11.2(L) 12.3(L) 13.6  Hematocrit 39 - 52 % 35.3(L)  38.6(L) 40.0  Platelets 150 - 400 K/uL 176 230 -   Lipid Panel Recent Labs    12/27/18 0814 01/04/19 0640 04/18/19 1019  CHOL 103 142 127  TRIG 182.0* 148 181*  LDLCALC 34 67 58  VLDL 36.4 30  --   HDL 32.10* 45 39*  CHOLHDL 3 3.2  --     HEMOGLOBIN A1C Lab Results  Component Value Date   HGBA1C 10.8 (H) 12/09/2019   MPG 263.26 12/09/2019   TSH Recent Labs    04/18/19 1019 10/09/19 0559  TSH 1.460 1.422   BNP    Component Value Date/Time   BNP 910.0 (H) 12/09/2019 0243   Medications and allergies  Allergies  Allergen Reactions  . Diltiazem Rash    No current facility-administered medications on file prior to encounter.   Current Outpatient Medications on File Prior to Encounter  Medication Sig Dispense Refill  . amiodarone (PACERONE) 200 MG tablet Take 0.5 tablets (100 mg total) by mouth daily. (Patient taking differently: Take 200 mg by mouth daily. )    . aspirin EC 81 MG EC tablet Take 1 tablet (81 mg total) by mouth daily.    . carvedilol (COREG) 12.5 MG tablet Take 1 tablet (12.5 mg total) by mouth 2 (two) times daily. 180 tablet 3  . Cholecalciferol (VITAMIN D3) 50 MCG (2000 UT) TABS Take 2,000 Units by mouth 2 (two) times daily.     . Cyanocobalamin 2500 MCG CHEW Chew 2,500 mcg by mouth daily.     . insulin aspart protamine- aspart (NOVOLOG MIX 70/30) (70-30) 100 UNIT/ML injection Inject 0.66 mLs (66 Units total) into the skin daily with breakfast AND 0.6 mLs (60 Units total) daily with supper. (Patient taking differently: Inject 66 units into the skin in the morning with breakfast and 60 units with supper) 10 mL 11  . isosorbide mononitrate (IMDUR) 30 MG 24 hr tablet Take 1 tablet (30 mg total) by mouth daily.    [name] Kitchen liraglutide (VICTOZA) 18 MG/3ML SOPN Inject 1.8 mg into the skin daily.    . metFORMIN (GLUCOPHAGE-XR) 500 MG 24 hr tablet Take 1,000 mg by mouth 2 (two) times daily.    . nitroGLYCERIN (NITROSTAT) 0.4 MG SL tablet Place 1 tablet (0.4 mg total)  under the tongue every 5 (five) minutes x 3 doses as needed for chest pain.  12  . Omega-3 Fatty Acids (FISH OIL) 1000 MG CAPS Take 2,000 mg by mouth 3 (three) times daily.     . OXYGEN Inhale 2 L/min into the lungs See admin instructions. 2 L/min at bedtime in conjunction with CPAP    . potassium chloride SA (KLOR-CON) 20 MEQ tablet TAKE 1 TABLET BY MOUTH TWICE A DAY (Patient taking differently: Take 20 mEq by mouth 2 (two) times daily. ) 60 tablet 1  . PRESCRIPTION MEDICATION Inhale into the lungs See admin instructions. CPAP- At bedtime    . rosuvastatin (CRESTOR) 40 MG tablet Take 1 tablet (40 mg total) by mouth at bedtime.    . sacubitril-valsartan (ENTRESTO) 97-103 MG Take 1 tablet by mouth 2 (two) times daily. 60 tablet   . sertraline (ZOLOFT) 100 MG tablet Take 50 mg by mouth daily.    . ticagrelor (BRILINTA) 90 MG TABS tablet Take 1 tablet (90 mg total) by mouth 2 (two) times daily. 60 tablet   . torsemide (DEMADEX) 20 MG tablet TAKE 1 TABLET BY MOUTH TWICE A DAY (Patient taking differently: Take 20 mg by mouth daily as needed (for an overnight weight gain of 3 pounds or more). ) 60 tablet 0  . metFORMIN (GLUCOPHAGE) 500 MG tablet Take 2 tablets (1,000 mg total) by mouth 2 (two) times daily with a meal. Taking 2 tabs twice a day (Patient not taking: Reported on 12/09/2019)    . sertraline (ZOLOFT) 50 MG tablet Take 1 tablet (50 mg total) by mouth at bedtime. (Patient not taking: Reported on 12/09/2019)      Scheduled Meds: . amiodarone  100 mg Oral Daily  . aspirin EC  81 mg Oral Daily  . bumetanide (BUMEX) IV  1 mg Intravenous Q12H  . carvedilol  12.5 mg Oral BID  . cholecalciferol  2,000 Units  Oral BID  . insulin aspart  0-15 Units Subcutaneous TID WC  . insulin aspart  0-5 Units Subcutaneous QHS  . insulin aspart protamine- aspart  60 Units Subcutaneous BID WC  . isosorbide mononitrate  30 mg Oral Daily  . rosuvastatin  40 mg Oral QHS  . sacubitril-valsartan  1 tablet Oral BID  .  sertraline  50 mg Oral Daily  . sodium chloride flush  3 mL Intravenous Q12H  . ticagrelor  90 mg Oral BID  . vitamin B-12  2,000 mcg Oral Daily   Continuous Infusions: . sodium chloride    . heparin 1,500 Units/hr (12/09/19 2341)   PRN Meds:.sodium chloride, acetaminophen, nitroGLYCERIN, ondansetron (ZOFRAN) IV, sodium chloride flush  Radiology:   Chest X-Ray 10/09/2019:  FINDINGS: Unchanged cardiomegaly. Worsened bilateral interstitial opacities. Remote median sternotomy with unchanged position of left chest wall 3 lead AICD. IMPRESSION: Cardiomegaly and worsened bilateral interstitial opacities likely indicating pulmonary edema.  Cardiac Studies:   Renal artery duplex 09/02/2017: Right: Abnormal right Resistive Index. No evidence of right renal  artery stenosis. Left: Normal left Resistive Index. No evidence of left renal artery stenosis. Mesenteric: Normal Celiac artery findings. 70 to 99% stenosis in the superior mesenteric artery.  Left Heart Catheterization12/14/2020:  1. Severe underlying three-vessel coronary artery disease with patent LIMA to LAD. Chronically occluded SVG to OM. Severe in-stent restenosis in left main stent extending into the proximal left circumflex. In addition, there is significant restenosis in the stent placed in the left posterior AV groove artery. The RCA is known to be small in size and severely diseased. 2. Left ventricular angiography was not performed due to chronic kidney disease. LVEDP was 13 mmHg.  Coronary angioplasty of the left main and mid circumflex coronary artery 01/11/2019: Successful Wolverine cutting balloon angioplasty of the left main, 3.5 x 10 mm balloon utilized and high-pressure 12 atmospheric pressure inflations performed throughout the in-stent restenotic left main and proximal ostial circumflex and also mid circumflex coronary artery, 99% reduced to 0% with TIMI II to TIMI-3 flow. 60 mill contrast  utilized.  Echocardiogram 06/15/2019:  Left ventricle cavity is moderately dilated. Moderate concentric  hypertrophy of the left ventricle. Doppler evidence of grade III  (restrictive) diastolic dysfunction, elevated LAP. Left ventricle regional  wall motion findings: Basal inferolateral akinesis. 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 Mild global hypokinesis. Abnormal septal wall motion due  to post-operative coronary artery bypass graft. Moderately depressed LV  systolic function with visual EF 35-40%.  Left atrial cavity is mildly dilated at 4.3 cm.  Right ventricle cavity is normal in size. Normal right ventricular  function. Pacemaker lead/ICD lead noted in the RV.  Trileaflet aortic valve. Mild (Grade I) aortic regurgitation.  Compared to 01/04/2019, EF improved from 25-30%  Left Heart Catheterization 10/09/2019:  LV: Normal LVEDP.  There was no pressure gradient across the aortic valve.  Angiogram not performed to conserve contrast. Right coronary artery nondominant and severely diffusely diseased unchanged from prior cardiac catheterization. Left main: Mid to distal left main has in-stent restenosis of 99% and extends into the dominant circumflex which also has proximal in-stent 99% stenosis with TIMI II flow.  Successful scoring balloon angioplasty with 3.5 x 15 mm Wolverine at high 14 atmospheric pressure, due to recall, haziness, stented with 4.0 x 18 mm resolute Onyx DES at 18 atmospheric pressure for 60 seconds, stenosis reduced in the left main and proximal in-stent restenosis from 99% to 0% with improvement in TIMI flow from 2-3. Successful PTCA  and Cutting Balloon angioplasty with Wolverine 3.0 x 10 mm balloon in the mid segment of the dominant circumflex coronary artery in-stent restenosis.  Stenosis reduced to 0% at 14 atmospheric pressure inflations x2.  TIMI-3 to TIMI-3 flow maintained. LAD: Is occluded in the proximal segment.  Distal LAD supplied by LIMA.  LIMA to LAD is  widely patent.  Device check: Medtronic CRTD Amplia MRI QWuad CRT-D 10/29/14   Scheduled Remote ICD check 11/22/2019:  There were 0 atrial high rate episodes detected. There were 0 VF episodes, 0 FVT episodes and 0 nonsustained episode of ventricular tachycardia. Health trends (patient activity, heart rate variability, average heart rates) are stable. Trans-thoracic impedance trends and the Optivol Fluid Index do no present significant abnormalities. Patient activity <1 hour per day.  Battery longevity is 2.7 years. RA pacing is 49.1 %, RV pacing is 99.3 %, and LV pacing is 99.3 %.  Unscheduled Remote ICD check patient activated 11/22/2019:  Patient activated event, accidental.  Possible fluid overload, impedance back to baseline.  A pace 49%, BiV pace 99%.  Scheduled  In office ICD 11/22/19  Single (S)/Dual (D)/BV (M) BV Presenting APBP Pacer dependant: No. Underlying NSR @ 55/min. AP 48%, BP 98.4% AMS Episodes 1.  SVT 11 S. HVR 0 since prior record 11/22/2019.   Longevity 2.7 Years/Voltage. Charge time 9.8Sec. Lead measurements: Stable Histogram: Low (L)/normal (N)/high (H)  Normal Patient activity Low. Thoracic impedance: Fluid accumulation last one month now back to baseline  Observations: Normal BV ICD.  Changes: Changed AP rate to 50 from 60 for preserving battery. Patient is programmed to [place] pace 100%   EKG:    12/09/2019: AV paced rhythm at rate of 75 bpm. No further analysis.   Telemetry 12/10/2019: AV paced rhythm.  No ventricular tachycardia.  No significant arrhythmias.  Assessment     ICD-10-CM   1. Acute pulmonary edema (HCC)  J81.0   2. Acute respiratory failure with hypoxia and hypercapnia (HCC)  J96.01    J96.02   3. Elevated troponin  R77.8   4. Renal insufficiency  N28.9   Chronic stage IIIa kidney disease, stable and non-ST elevation myocardial infarction. Recommendations:   Caylon Saine  is a 64 y.o. Caucasian male with CAD and history of CABG x2 in 2002   with only LIMA to LAD being patent, Circumflex coronary artery is super dominant. History of stenting to circumflex/OM bifurcation due to recurrent restenosis in 2015, left main and circumflex proximal and midsegment in 2016, angioplasty to left main and proximal and mid circumflex in 2020 and again 10/08/2019.  He also has a history of VT with ICD, ischemic cardiomyopathy, chronic systolic and diastolic heart failure, uncontrolled diabetes mellitus, hypertension, stage III chronic kidney disease, obstructive sleep apnea on CPAP. He was recently admitted to the hospital on 10/08/2019 with acute pulmonary edema and non-ST elevation MI for which he underwent cardiac catheterization with angioplasty to left main and left circumflex coronary arteries.  He is now readmitted to the hospital with acute pulmonary edema with similar presentation that he did previously, suspect he probably has recurrence of restenosis.  I will start the patient back on IV heparin, hold Brilinta, I will consider consultation with cardiothoracic surgery for single-vessel bypass involving the large and dominant circumflex coronary artery if indeed he does have restenosis.  He has 3 layers of stent in his left main and in the proximal circumflex.  He needs repeat coronary angiography, have discussed the risks and benefits with the patient  and is willing to proceed.  Advised on appropriate medical therapy including dual antiplatelet therapy, Entresto for ischemic cardiomyopathy, beta-blocker for CAD.  Is also on a statin.   Adrian Prows, PA-C 12/10/2019, 9:32 AM Office: [phone]

## 2019-12-10 NOTE — Progress Notes (Signed)
Primary Physician/Referring:  [name], MD  Patient ID: Martin Bush, male    DOB: [birth year], 64 y.o.   MRN: [id number]  Chief Complaint  Patient presents with  . Respiratory Distress   History   Kevron Patella  is a 64 y.o. Caucasian male with CAD and history of CABG x2 in 2002  with only LIMA to LAD being patent, Circumflex coronary artery is super dominant. History of stenting to circumflex/OM bifurcation due to recurrent restenosis in 2015, left main and circumflex proximal and midsegment in 2016, angioplasty to left main and proximal and mid circumflex in 2020 and again 10/08/2019.  He also has a history of VT with ICD, ischemic cardiomyopathy, chronic systolic and diastolic heart failure.  He was admitted to the hospital on 10/08/2019 with acute pulmonary edema and non-ST elevation MI for which he underwent repeat angioplasty and stenting to left main and left circumflex coronary arteries.  He was admitted with acute onset pulmonary edema yesterday and needing BiPAP support and diuresis.  This morning he feels well, states that he is able to lay down flat in bed without any distress.  States that his leg edema is also improved.  He has not had any palpitations or chest discomfort.  Past Medical History:  Diagnosis Date  . AICD (automatic cardioverter/defibrillator) present    Medtronic  . Arthritis    "thumbs"  (08/02/2017)  . CHF (congestive heart failure) ([place])   . Chronic combined systolic and diastolic heart failure ([place])    a. 08/2017 Echo: EF 20-25%, mod glob HK. Sev distal ant sept, inflat HK. Apical AK. Gr2 DD. Mildly reduced RV fxn.  . Colon polyps   . Coronary artery disease    a. s/p CABG x 2 (LIMA->LAD, VG->OM); b. Multiple PCI's to LM/LCX/OM; c. 07/2017 PTCA of LM/LCX w/ early ISR-->repeat PCI/DES to LM (3.5x20 Synergy DES) and LCX (3.0x20 Synergy DES); d. 07/2018 Relook Cath: LM patent stent, LAD 100ost, LCX patent stent, OM1 99/60, LIMA->LAD ok. VG->dLCX 63  (old).  . Depression   . Encounter for assessment of implantable cardioverter-defibrillator (ICD) 09/27/2018  . High cholesterol   . Hypertension   . ICD; Biventricular  Medtronic ICD Amplia MRI QWuad CRT-D  in situ 10/29/14 10/29/2014   Remote ICD check 09.23.20:  One 6 beat NSVT. No therapy.  1 SVT episode @ 130 bpm (38 Sec).  There were 23 Vent sense episodes detected for up to 1.1 min/day (AT). Health trends (patient activity, heart rate variability, average heart rates) are stable.Trans-thoracic impedance trends and the OptiVol Fluid Index do no present significant abnormalities. Battery longevity is 4.3 years. RA pacing is 3  . Ischemic cardiomyopathy 09/27/2018  . MI (myocardial infarction) ([place]) 2003  . NSTEMI (non-ST elevated myocardial infarction) ([place]) 07/16/2014  . OSA on CPAP   . Oxygen deficiency   . Pneumonia 10/2014  . Proteinuria   . Sleep apnea   . Type II diabetes mellitus (HCC)    insulin dependent   Past Surgical History:  Procedure Laterality Date  . BIOPSY  09/19/2018   Procedure: BIOPSY;  Surgeon: Thornton Park, MD;  Location: [facility];  Service: Gastroenterology;;  . CARDIAC CATHETERIZATION N/A 07/16/2014   Procedure: Left Heart Cath and Coronary Angiography;  Surgeon: Adrian Prows, MD;  Location: [place] [facility];  Service: Cardiovascular;  Laterality: N/A;  . CARDIAC CATHETERIZATION  07/16/2014   Procedure: Coronary Balloon Angioplasty;  Surgeon: Adrian Prows, MD;  Location: [place] [facility];  Service: Cardiovascular;;  .  CARDIAC CATHETERIZATION  2003  . CARDIAC DEFIBRILLATOR PLACEMENT  2016  . CATARACT EXTRACTION W/ INTRAOCULAR LENS IMPLANT Left 03/2014  . COLONOSCOPY    . COLONOSCOPY WITH PROPOFOL N/A 09/19/2018   Procedure: COLONOSCOPY WITH PROPOFOL;  Surgeon: Thornton Park, MD;  [facility]: [facility];  Service: Gastroenterology;  Laterality: N/A;  . CORONARY ANGIOPLASTY WITH STENT PLACEMENT     "I've got a total of 8 stents in there; mostly doine at  [facility]" (08/02/2017)  . CORONARY ARTERY BYPASS GRAFT  ~ 2003   "CABG X2"; [facility]  . CORONARY ATHERECTOMY N/A 08/16/2017   Procedure: CORONARY ATHERECTOMY;  Surgeon: Nigel Mormon, MD;  Location: [place] [facility];  Service: Cardiovascular;  Laterality: N/A;  . CORONARY BALLOON ANGIOPLASTY N/A 08/04/2017   Procedure: CORONARY BALLOON ANGIOPLASTY;  Surgeon: Adrian Prows, MD;  Location: [place] [facility];  Service: Cardiovascular;  Laterality: N/A;  . CORONARY BALLOON ANGIOPLASTY N/A 01/11/2019   Procedure: CORONARY BALLOON ANGIOPLASTY;  Surgeon: Adrian Prows, MD;  Location: [place] [facility];  Service: Cardiovascular;  Laterality: N/A;  . CORONARY BALLOON ANGIOPLASTY N/A 10/09/2019   Procedure: CORONARY BALLOON ANGIOPLASTY;  Surgeon: Adrian Prows, MD;  Location: [place] [facility];  Service: Cardiovascular;  Laterality: N/A;  . CORONARY STENT INTERVENTION N/A 08/16/2017   Procedure: CORONARY STENT INTERVENTION;  Surgeon: Nigel Mormon, MD;  Location: [place] [facility];  Service: Cardiovascular;  Laterality: N/A;  . CORONARY STENT INTERVENTION N/A 10/09/2019   Procedure: CORONARY STENT INTERVENTION;  Surgeon: Adrian Prows, MD;  Location: [place] [facility];  Service: Cardiovascular;  Laterality: N/A;  . ELBOW SURGERY Left ?2001   "pinched nerve"  . ENDOSCOPIC MUCOSAL RESECTION  12/18/2018   Procedure: ENDOSCOPIC MUCOSAL RESECTION;  Surgeon: Rush Landmark Telford Nab., MD;  Location: [place];  Service: Gastroenterology;;  . Otho Darner SIGMOIDOSCOPY N/A 12/18/2018   Procedure: Beryle Quant;  Surgeon: Irving Copas., MD;  Location: [place];  Service: Gastroenterology;  Laterality: N/A;  . HEMOSTASIS CLIP PLACEMENT  12/18/2018   Procedure: HEMOSTASIS CLIP PLACEMENT;  Surgeon: Irving Copas., MD;  Location: [place];  Service: Gastroenterology;;  . LEFT HEART CATH AND CORONARY ANGIOGRAPHY N/A 01/11/2019   Procedure: LEFT HEART CATH AND CORONARY  ANGIOGRAPHY;  Surgeon: Adrian Prows, MD;  Location: [place] [facility];  Service: Cardiovascular;  Laterality: N/A;  . LEFT HEART CATH AND CORS/GRAFTS ANGIOGRAPHY N/A 08/04/2017   Procedure: LEFT HEART CATH AND CORS/GRAFTS ANGIOGRAPHY;  Surgeon: Adrian Prows, MD;  Location: [place] [facility];  Service: Cardiovascular;  Laterality: N/A;  . LEFT HEART CATH AND CORS/GRAFTS ANGIOGRAPHY N/A 08/20/2017   Procedure: LEFT HEART CATH AND CORS/GRAFTS ANGIOGRAPHY;  Surgeon: Nigel Mormon, MD;  Location: [place] [facility];  Service: Cardiovascular;  Laterality: N/A;  . LEFT HEART CATH AND CORS/GRAFTS ANGIOGRAPHY N/A 01/08/2019   Procedure: LEFT HEART CATH AND CORS/GRAFTS ANGIOGRAPHY;  Surgeon: Wellington Hampshire, MD;  Location: [place] [facility];  Service: Cardiovascular;  Laterality: N/A;  . LEFT HEART CATH AND CORS/GRAFTS ANGIOGRAPHY N/A 10/09/2019   Procedure: LEFT HEART CATH AND CORS/GRAFTS ANGIOGRAPHY;  Surgeon: Adrian Prows, MD;  Location: [place] [facility];  Service: Cardiovascular;  Laterality: N/A;  . POLYPECTOMY  09/19/2018   Procedure: POLYPECTOMY;  Surgeon: Thornton Park, MD;  [facility];  Service: Gastroenterology;;  . Lia Foyer INJECTION  09/19/2018   Procedure: SUBMUCOSAL INJECTION;  Surgeon: Thornton Park, MD;  [facility];  Service: Gastroenterology;;  . Lia Foyer LIFTING INJECTION  12/18/2018  Procedure: SUBMUCOSAL LIFTING INJECTION;  Surgeon: Rush Landmark Telford Nab., MD;  Location: [facility];  Service: Gastroenterology;;    Family History  Problem Relation Age of Onset  . Uterine cancer Mother   . Lung cancer Mother   . Hyperlipidemia Father   . Heart disease Father   . Hypertension Father   . Diabetes Father   . Colon cancer Neg Hx   . Esophageal cancer Neg Hx   . Colon polyps Neg Hx   . Rectal cancer Neg Hx   . Stomach cancer Neg Hx   . Inflammatory bowel disease Neg Hx   . Liver disease Neg Hx   . Pancreatic cancer Neg Hx    Social  History   Tobacco Use  . Smoking status: Former Smoker    Packs/day: 0.25    Years: 27.00    Pack years: 6.75    Types: Cigars    Quit date: 2002    Years since quitting: 19.8  . Smokeless tobacco: Former Systems developer    Quit date: 2002  Substance Use Topics  . Alcohol use: Not Currently    Marital Status: Married ROS  Review of Systems  Cardiovascular: Positive for dyspnea on exertion, leg swelling and orthopnea. Negative for chest pain, claudication, palpitations, paroxysmal nocturnal dyspnea and syncope.  Hematologic/Lymphatic: Does not bruise/bleed easily.  Gastrointestinal: Negative for melena.  Neurological: Negative for dizziness.  All other systems reviewed and are negative.  Objective  Blood pressure 116/74, pulse 77, temperature 98.5 F (36.9 C), temperature source Oral, resp. rate 17, height 5\' 7"  (1.702 m), weight 99.3 kg, SpO2 95 %.  Vitals with BMI 12/10/2019 12/10/2019 12/10/2019  Height - - -  Weight - - 219 lbs  BMI - - 99.35  Systolic 701 779 390  Diastolic 74 70 88  Pulse 77 83 97     Physical Exam Vitals reviewed.  Constitutional:      General: He is not in acute distress.    Appearance: He is obese.     Comments: He is moderately built and mildly obese in no acute distress.  Cardiovascular:     Rate and Rhythm: Normal rate and regular rhythm.     Pulses:          Carotid pulses are 1+ on the right side with bruit and 1+ on the left side with bruit.      Femoral pulses are 2+ on the right side and 2+ on the left side.      Popliteal pulses are 1+ on the right side and 1+ on the left side.       Dorsalis pedis pulses are 0 on the right side and 0 on the left side.       Posterior tibial pulses are 1+ on the right side and 0 on the left side.     Heart sounds: Normal heart sounds.     Comments: No JVD.  1+ pitting edema below knee bilateral  Pulmonary:     Effort: Pulmonary effort is normal. No accessory muscle usage or respiratory distress.     Breath  sounds: Normal breath sounds.  Musculoskeletal:        General: Swelling present. Normal range of motion.  Neurological:     General: No focal deficit present.     Mental Status: He is alert and oriented to person, place, and time.    Laboratory examination:   Recent Labs    10/09/19 0558 10/23/19 1347 10/23/19 1347 11/14/19 1514 11/14/19  1514 12/06/19 1431 12/06/19 1431 12/09/19 0252 12/09/19 0538 12/09/19 1553 12/10/19 0218  NA   < > 136   < > 138   < > 141  --  138 134*  136  --  133*  K   < > 4.6   < > 4.8   < > 5.1  --  4.7 4.9  4.7  --  3.9  CL  --  100   < > 102   < > 103  --   --  104  --  100  CO2  --  20   < > 21   < > 24  --   --  18*  --  25  GLUCOSE  --  356*   < > 362*   < > 377*  --   --  342*  --  283*  BUN  --  28*   < > 24   < > 17  --   --  27*  --  27*  CREATININE  --  1.32*   < > 1.43*   < > 1.30*   < >  --  1.60* 1.47* 1.66*  CALCIUM  --  9.4   < > 9.3   < > 8.9  --   --  8.7*  --  8.6*  GFRNONAA  --  57*   < > 52*   < > 58*  --   --  48* 53* 46*  GFRAA  --  66  --  60  --  67  --   --   --   --   --    < > = values in this interval not displayed.   estimated creatinine clearance is 51.2 mL/min (A) (by C-G formula based on SCr of 1.66 mg/dL (H)).  CMP Latest Ref Rng & Units 12/10/2019 12/09/2019 12/09/2019  Glucose 70 - 99 mg/dL 283(H) - 342(H)  BUN 8 - 23 mg/dL 27(H) - 27(H)  Creatinine 0.61 - 1.24 mg/dL 1.66(H) 1.47(H) 1.60(H)  Sodium 135 - 145 mmol/L 133(L) - 134(L)  Potassium 3.5 - 5.1 mmol/L 3.9 - 4.9  Chloride 98 - 111 mmol/L 100 - 104  CO2 22 - 32 mmol/L 25 - 18(L)  Calcium 8.9 - 10.3 mg/dL 8.6(L) - 8.7(L)  Total Protein 6.5 - 8.1 g/dL - - -  Total Bilirubin 0.3 - 1.2 mg/dL - - -  Alkaline Phos 38 - 126 U/L - - -  AST 15 - 41 U/L - - -  ALT 0 - 44 U/L - - -   CBC Latest Ref Rng & Units 12/10/2019 12/09/2019 12/09/2019  WBC 4.0 - 10.5 K/uL 11.2(H) 10.3 -  Hemoglobin 13.0 - 17.0 g/dL 11.2(L) 12.3(L) 13.6  Hematocrit 39 - 52 % 35.3(L)  38.6(L) 40.0  Platelets 150 - 400 K/uL 176 230 -   Lipid Panel Recent Labs    12/27/18 0814 01/04/19 0640 04/18/19 1019  CHOL 103 142 127  TRIG 182.0* 148 181*  LDLCALC 34 67 58  VLDL 36.4 30  --   HDL 32.10* 45 39*  CHOLHDL 3 3.2  --     HEMOGLOBIN A1C Lab Results  Component Value Date   HGBA1C 10.8 (H) 12/09/2019   MPG 263.26 12/09/2019   TSH Recent Labs    04/18/19 1019 10/09/19 0559  TSH 1.460 1.422   BNP    Component Value Date/Time   BNP 910.0 (H) 12/09/2019 0243   Medications and allergies  Allergies  Allergen Reactions  . Diltiazem Rash    No current facility-administered medications on file prior to encounter.   Current Outpatient Medications on File Prior to Encounter  Medication Sig Dispense Refill  . amiodarone (PACERONE) 200 MG tablet Take 0.5 tablets (100 mg total) by mouth daily. (Patient taking differently: Take 200 mg by mouth daily. )    . aspirin EC 81 MG EC tablet Take 1 tablet (81 mg total) by mouth daily.    . carvedilol (COREG) 12.5 MG tablet Take 1 tablet (12.5 mg total) by mouth 2 (two) times daily. 180 tablet 3  . Cholecalciferol (VITAMIN D3) 50 MCG (2000 UT) TABS Take 2,000 Units by mouth 2 (two) times daily.     . Cyanocobalamin 2500 MCG CHEW Chew 2,500 mcg by mouth daily.     . insulin aspart protamine- aspart (NOVOLOG MIX 70/30) (70-30) 100 UNIT/ML injection Inject 0.66 mLs (66 Units total) into the skin daily with breakfast AND 0.6 mLs (60 Units total) daily with supper. (Patient taking differently: Inject 66 units into the skin in the morning with breakfast and 60 units with supper) 10 mL 11  . isosorbide mononitrate (IMDUR) 30 MG 24 hr tablet Take 1 tablet (30 mg total) by mouth daily.    [name] Kitchen liraglutide (VICTOZA) 18 MG/3ML SOPN Inject 1.8 mg into the skin daily.    . metFORMIN (GLUCOPHAGE-XR) 500 MG 24 hr tablet Take 1,000 mg by mouth 2 (two) times daily.    . nitroGLYCERIN (NITROSTAT) 0.4 MG SL tablet Place 1 tablet (0.4 mg total)  under the tongue every 5 (five) minutes x 3 doses as needed for chest pain.  12  . Omega-3 Fatty Acids (FISH OIL) 1000 MG CAPS Take 2,000 mg by mouth 3 (three) times daily.     . OXYGEN Inhale 2 L/min into the lungs See admin instructions. 2 L/min at bedtime in conjunction with CPAP    . potassium chloride SA (KLOR-CON) 20 MEQ tablet TAKE 1 TABLET BY MOUTH TWICE A DAY (Patient taking differently: Take 20 mEq by mouth 2 (two) times daily. ) 60 tablet 1  . PRESCRIPTION MEDICATION Inhale into the lungs See admin instructions. CPAP- At bedtime    . rosuvastatin (CRESTOR) 40 MG tablet Take 1 tablet (40 mg total) by mouth at bedtime.    . sacubitril-valsartan (ENTRESTO) 97-103 MG Take 1 tablet by mouth 2 (two) times daily. 60 tablet   . sertraline (ZOLOFT) 100 MG tablet Take 50 mg by mouth daily.    . ticagrelor (BRILINTA) 90 MG TABS tablet Take 1 tablet (90 mg total) by mouth 2 (two) times daily. 60 tablet   . torsemide (DEMADEX) 20 MG tablet TAKE 1 TABLET BY MOUTH TWICE A DAY (Patient taking differently: Take 20 mg by mouth daily as needed (for an overnight weight gain of 3 pounds or more). ) 60 tablet 0  . metFORMIN (GLUCOPHAGE) 500 MG tablet Take 2 tablets (1,000 mg total) by mouth 2 (two) times daily with a meal. Taking 2 tabs twice a day (Patient not taking: Reported on 12/09/2019)    . sertraline (ZOLOFT) 50 MG tablet Take 1 tablet (50 mg total) by mouth at bedtime. (Patient not taking: Reported on 12/09/2019)      Scheduled Meds: . amiodarone  100 mg Oral Daily  . aspirin EC  81 mg Oral Daily  . bumetanide (BUMEX) IV  1 mg Intravenous Q12H  . carvedilol  12.5 mg Oral BID  . cholecalciferol  2,000 Units  Oral BID  . insulin aspart  0-15 Units Subcutaneous TID WC  . insulin aspart  0-5 Units Subcutaneous QHS  . insulin aspart protamine- aspart  60 Units Subcutaneous BID WC  . isosorbide mononitrate  30 mg Oral Daily  . rosuvastatin  40 mg Oral QHS  . sacubitril-valsartan  1 tablet Oral BID  .  sertraline  50 mg Oral Daily  . sodium chloride flush  3 mL Intravenous Q12H  . ticagrelor  90 mg Oral BID  . vitamin B-12  2,000 mcg Oral Daily   Continuous Infusions: . sodium chloride    . heparin 1,500 Units/hr (12/09/19 2341)   PRN Meds:.sodium chloride, acetaminophen, nitroGLYCERIN, ondansetron (ZOFRAN) IV, sodium chloride flush  Radiology:   Chest X-Ray 10/09/2019:  FINDINGS: Unchanged cardiomegaly. Worsened bilateral interstitial opacities. Remote median sternotomy with unchanged position of left chest wall 3 lead AICD. IMPRESSION: Cardiomegaly and worsened bilateral interstitial opacities likely indicating pulmonary edema.  Cardiac Studies:   Renal artery duplex 09/02/2017: Right: Abnormal right Resistive Index. No evidence of right renal  artery stenosis. Left: Normal left Resistive Index. No evidence of left renal artery stenosis. Mesenteric: Normal Celiac artery findings. 70 to 99% stenosis in the superior mesenteric artery.  Left Heart Catheterization12/14/2020:  1. Severe underlying three-vessel coronary artery disease with patent LIMA to LAD. Chronically occluded SVG to OM. Severe in-stent restenosis in left main stent extending into the proximal left circumflex. In addition, there is significant restenosis in the stent placed in the left posterior AV groove artery. The RCA is known to be small in size and severely diseased. 2. Left ventricular angiography was not performed due to chronic kidney disease. LVEDP was 13 mmHg.  Coronary angioplasty of the left main and mid circumflex coronary artery 01/11/2019: Successful Wolverine cutting balloon angioplasty of the left main, 3.5 x 10 mm balloon utilized and high-pressure 12 atmospheric pressure inflations performed throughout the in-stent restenotic left main and proximal ostial circumflex and also mid circumflex coronary artery, 99% reduced to 0% with TIMI II to TIMI-3 flow. 60 mill contrast  utilized.  Echocardiogram 06/15/2019:  Left ventricle cavity is moderately dilated. Moderate concentric  hypertrophy of the left ventricle. Doppler evidence of grade III  (restrictive) diastolic dysfunction, elevated LAP. Left ventricle regional  wall motion findings: Basal inferolateral akinesis. 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 Mild global hypokinesis. Abnormal septal wall motion due  to post-operative coronary artery bypass graft. Moderately depressed LV  systolic function with visual EF 35-40%.  Left atrial cavity is mildly dilated at 4.3 cm.  Right ventricle cavity is normal in size. Normal right ventricular  function. Pacemaker lead/ICD lead noted in the RV.  Trileaflet aortic valve. Mild (Grade I) aortic regurgitation.  Compared to 01/04/2019, EF improved from 25-30%  Left Heart Catheterization 10/09/2019:  LV: Normal LVEDP.  There was no pressure gradient across the aortic valve.  Angiogram not performed to conserve contrast. Right coronary artery nondominant and severely diffusely diseased unchanged from prior cardiac catheterization. Left main: Mid to distal left main has in-stent restenosis of 99% and extends into the dominant circumflex which also has proximal in-stent 99% stenosis with TIMI II flow.  Successful scoring balloon angioplasty with 3.5 x 15 mm Wolverine at high 14 atmospheric pressure, due to recall, haziness, stented with 4.0 x 18 mm resolute Onyx DES at 18 atmospheric pressure for 60 seconds, stenosis reduced in the left main and proximal in-stent restenosis from 99% to 0% with improvement in TIMI flow from 2-3. Successful PTCA  and Cutting Balloon angioplasty with Wolverine 3.0 x 10 mm balloon in the mid segment of the dominant circumflex coronary artery in-stent restenosis.  Stenosis reduced to 0% at 14 atmospheric pressure inflations x2.  TIMI-3 to TIMI-3 flow maintained. LAD: Is occluded in the proximal segment.  Distal LAD supplied by LIMA.  LIMA to LAD is  widely patent.  Device check: Medtronic CRTD Amplia MRI QWuad CRT-D 10/29/14   Scheduled Remote ICD check 11/22/2019:  There were 0 atrial high rate episodes detected. There were 0 VF episodes, 0 FVT episodes and 0 nonsustained episode of ventricular tachycardia. Health trends (patient activity, heart rate variability, average heart rates) are stable. Trans-thoracic impedance trends and the Optivol Fluid Index do no present significant abnormalities. Patient activity <1 hour per day.  Battery longevity is 2.7 years. RA pacing is 49.1 %, RV pacing is 99.3 %, and LV pacing is 99.3 %.  Unscheduled Remote ICD check patient activated 11/22/2019:  Patient activated event, accidental.  Possible fluid overload, impedance back to baseline.  A pace 49%, BiV pace 99%.  Scheduled  In office ICD 11/22/19  Single (S)/Dual (D)/BV (M) BV Presenting APBP Pacer dependant: No. Underlying NSR @ 55/min. AP 48%, BP 98.4% AMS Episodes 1.  SVT 11 S. HVR 0 since prior record 11/22/2019.   Longevity 2.7 Years/Voltage. Charge time 9.8Sec. Lead measurements: Stable Histogram: Low (L)/normal (N)/high (H)  Normal Patient activity Low. Thoracic impedance: Fluid accumulation last one month now back to baseline  Observations: Normal BV ICD.  Changes: Changed AP rate to 50 from 60 for preserving battery. Patient is programmed to [place] pace 100%   EKG:    12/09/2019: AV paced rhythm at rate of 75 bpm. No further analysis.   Telemetry 12/10/2019: AV paced rhythm.  No ventricular tachycardia.  No significant arrhythmias.  Assessment     ICD-10-CM   1. Acute pulmonary edema (HCC)  J81.0   2. Acute respiratory failure with hypoxia and hypercapnia (HCC)  J96.01    J96.02   3. Elevated troponin  R77.8   4. Renal insufficiency  N28.9   Chronic stage IIIa kidney disease, stable and non-ST elevation myocardial infarction. Recommendations:   Jahmarion Popoff  is a 64 y.o. Caucasian male with CAD and history of CABG x2 in 2002   with only LIMA to LAD being patent, Circumflex coronary artery is super dominant. History of stenting to circumflex/OM bifurcation due to recurrent restenosis in 2015, left main and circumflex proximal and midsegment in 2016, angioplasty to left main and proximal and mid circumflex in 2020 and again 10/08/2019.  He also has a history of VT with ICD, ischemic cardiomyopathy, chronic systolic and diastolic heart failure, uncontrolled diabetes mellitus, hypertension, stage III chronic kidney disease, obstructive sleep apnea on CPAP. He was recently admitted to the hospital on 10/08/2019 with acute pulmonary edema and non-ST elevation MI for which he underwent cardiac catheterization with angioplasty to left main and left circumflex coronary arteries.  He is now readmitted to the hospital with acute pulmonary edema with similar presentation that he did previously, suspect he probably has recurrence of restenosis.  I will start the patient back on IV heparin, hold Brilinta, I will consider consultation with cardiothoracic surgery for single-vessel bypass involving the large and dominant circumflex coronary artery if indeed he does have restenosis.  He has 3 layers of stent in his left main and in the proximal circumflex.  He needs repeat coronary angiography, have discussed the risks and benefits with the patient  and is willing to proceed.  Advised on appropriate medical therapy including dual antiplatelet therapy, Entresto for ischemic cardiomyopathy, beta-blocker for CAD.  Is also on a statin.   Adrian Prows, PA-C 12/10/2019, 9:32 AM Office: [phone]

## 2019-12-10 NOTE — Progress Notes (Signed)
ANTICOAGULATION CONSULT NOTE  Pharmacy Consult for heparin Indication: chest pain/ACS   Assessment: 45 yom presented to the ED with CP. Started on IV heparin. Heparin level this am 0.47 units/ml.  No bleeding noted  Goal of Therapy:  Heparin level 0.3-0.7 units/ml Monitor platelets by anticoagulation protocol: Yes   Plan:  Continue heparin gtt at 1500 units/hr Daily heparin level and CBC  Mearle Drew Poteet 12/10/2019,4:34 AM

## 2019-12-11 ENCOUNTER — Encounter (HOSPITAL_COMMUNITY)
Admission: EM | Disposition: A | Discharge status: Home / Self Care | Payer: Self-pay | Source: Home / Self Care | Attending: Cardiology

## 2019-12-11 ENCOUNTER — Encounter (HOSPITAL_COMMUNITY): Payer: Self-pay | Admitting: Cardiology

## 2019-12-11 DIAGNOSIS — R7989 Other specified abnormal findings of blood chemistry: Secondary | ICD-10-CM

## 2019-12-11 DIAGNOSIS — R778 Other specified abnormalities of plasma proteins: Secondary | ICD-10-CM

## 2019-12-11 HISTORY — PX: LEFT HEART CATH AND CORS/GRAFTS ANGIOGRAPHY: CATH118250

## 2019-12-11 LAB — CBC
HCT: 31.5 % — ABNORMAL LOW (ref 39.0–52.0)
Hemoglobin: 10.1 g/dL — ABNORMAL LOW (ref 13.0–17.0)
MCH: 29.8 pg (ref 26.0–34.0)
MCHC: 32.1 g/dL (ref 30.0–36.0)
MCV: 92.9 fL (ref 80.0–100.0)
Platelets: 180 10*3/uL (ref 150–400)
RBC: 3.39 MIL/uL — ABNORMAL LOW (ref 4.22–5.81)
RDW: 13.2 % (ref 11.5–15.5)
WBC: 6.9 10*3/uL (ref 4.0–10.5)
nRBC: 0 % (ref 0.0–0.2)

## 2019-12-11 LAB — BASIC METABOLIC PANEL
Anion gap: 9 (ref 5–15)
BUN: 32 mg/dL — ABNORMAL HIGH (ref 8–23)
CO2: 25 mmol/L (ref 22–32)
Calcium: 8.8 mg/dL — ABNORMAL LOW (ref 8.9–10.3)
Chloride: 102 mmol/L (ref 98–111)
Creatinine, Ser: 1.57 mg/dL — ABNORMAL HIGH (ref 0.61–1.24)
GFR, Estimated: 49 mL/min — ABNORMAL LOW (ref >60)
Glucose, Bld: 102 mg/dL — ABNORMAL HIGH (ref 70–99)
Potassium: 3.9 mmol/L (ref 3.5–5.1)
Sodium: 136 mmol/L (ref 135–145)

## 2019-12-11 LAB — GLUCOSE, CAPILLARY
Glucose-Capillary: 97 mg/dL (ref 70–99)
Glucose-Capillary: 105 mg/dL — ABNORMAL HIGH (ref 70–99)
Glucose-Capillary: 231 mg/dL — ABNORMAL HIGH (ref 70–99)
Glucose-Capillary: 282 mg/dL — ABNORMAL HIGH (ref 70–99)

## 2019-12-11 LAB — HEPARIN LEVEL (UNFRACTIONATED): Heparin Unfractionated: 0.42 IU/mL (ref 0.30–0.70)

## 2019-12-11 SURGERY — LEFT HEART CATH AND CORS/GRAFTS ANGIOGRAPHY
Anesthesia: LOCAL

## 2019-12-11 MED ORDER — ACETAMINOPHEN 325 MG PO TABS: 650.0000 mg | ORAL_TABLET | ORAL | Status: DC | PRN

## 2019-12-11 MED ORDER — TICAGRELOR 90 MG PO TABS
180.0000 mg | ORAL_TABLET | Freq: Once | ORAL | Status: AC
  Administered 2019-12-11: 180 mg via ORAL
  Filled 2019-12-11: qty 2

## 2019-12-11 MED ORDER — FENTANYL CITRATE (PF) 100 MCG/2ML IJ SOLN
INTRAMUSCULAR | Status: DC | PRN
  Administered 2019-12-11: 50 ug via INTRAVENOUS

## 2019-12-11 MED ORDER — HYDRALAZINE HCL 20 MG/ML IJ SOLN: 10.0000 mg | INTRAMUSCULAR | Status: AC | PRN

## 2019-12-11 MED ORDER — LIDOCAINE HCL (PF) 1 % IJ SOLN
INTRAMUSCULAR | Status: AC
  Filled 2019-12-11: qty 30

## 2019-12-11 MED ORDER — SODIUM CHLORIDE 0.9% FLUSH
3.0000 mL | Freq: Two times a day (BID) | INTRAVENOUS | Status: DC
  Administered 2019-12-11 (×2): 3 mL via INTRAVENOUS

## 2019-12-11 MED ORDER — HEPARIN (PORCINE) IN NACL 1000-0.9 UT/500ML-% IV SOLN
INTRAVENOUS | Status: AC
  Filled 2019-12-11: qty 1000

## 2019-12-11 MED ORDER — IOHEXOL 350 MG/ML SOLN
INTRAVENOUS | Status: AC
  Filled 2019-12-11: qty 1

## 2019-12-11 MED ORDER — FENTANYL CITRATE (PF) 100 MCG/2ML IJ SOLN
INTRAMUSCULAR | Status: AC
  Filled 2019-12-11: qty 2

## 2019-12-11 MED ORDER — SODIUM CHLORIDE 0.9% FLUSH: 3.0000 mL | INTRAVENOUS | Status: DC | PRN

## 2019-12-11 MED ORDER — VERAPAMIL HCL 2.5 MG/ML IV SOLN
INTRAVENOUS | Status: AC
  Filled 2019-12-11: qty 2

## 2019-12-11 MED ORDER — VERAPAMIL HCL 2.5 MG/ML IV SOLN
INTRAVENOUS | Status: DC | PRN
  Administered 2019-12-11: 10 mL via INTRA_ARTERIAL

## 2019-12-11 MED ORDER — ONDANSETRON HCL 4 MG/2ML IJ SOLN: 4.0000 mg | Freq: Four times a day (QID) | INTRAMUSCULAR | Status: DC | PRN

## 2019-12-11 MED ORDER — NITROGLYCERIN 1 MG/10 ML FOR IR/CATH LAB
INTRA_ARTERIAL | Status: AC
  Filled 2019-12-11: qty 10

## 2019-12-11 MED ORDER — HEPARIN (PORCINE) IN NACL 1000-0.9 UT/500ML-% IV SOLN
INTRAVENOUS | Status: DC | PRN
  Administered 2019-12-11 (×2): 500 mL

## 2019-12-11 MED ORDER — MIDAZOLAM HCL 2 MG/2ML IJ SOLN
INTRAMUSCULAR | Status: DC | PRN
  Administered 2019-12-11: 2 mg via INTRAVENOUS

## 2019-12-11 MED ORDER — LIDOCAINE HCL (PF) 1 % IJ SOLN
INTRAMUSCULAR | Status: DC | PRN
  Administered 2019-12-11: 5 mL

## 2019-12-11 MED ORDER — IOHEXOL 350 MG/ML SOLN
INTRAVENOUS | Status: DC | PRN
  Administered 2019-12-11: 65 mL

## 2019-12-11 MED ORDER — TICAGRELOR 90 MG PO TABS
90.0000 mg | ORAL_TABLET | Freq: Two times a day (BID) | ORAL | Status: DC
  Administered 2019-12-11 – 2019-12-12 (×2): 90 mg via ORAL
  Filled 2019-12-11 (×2): qty 1

## 2019-12-11 MED ORDER — MIDAZOLAM HCL 2 MG/2ML IJ SOLN
INTRAMUSCULAR | Status: AC
  Filled 2019-12-11: qty 2

## 2019-12-11 MED ORDER — SODIUM CHLORIDE 0.9 % IV SOLN: 250.0000 mL | INTRAVENOUS | Status: DC | PRN

## 2019-12-11 MED ORDER — LABETALOL HCL 5 MG/ML IV SOLN: 10.0000 mg | INTRAVENOUS | Status: AC | PRN

## 2019-12-11 MED ORDER — HEPARIN SODIUM (PORCINE) 1000 UNIT/ML IJ SOLN
INTRAMUSCULAR | Status: AC
  Filled 2019-12-11: qty 1

## 2019-12-11 MED ORDER — HEPARIN SODIUM (PORCINE) 1000 UNIT/ML IJ SOLN
INTRAMUSCULAR | Status: DC | PRN
  Administered 2019-12-11: 5000 [IU] via INTRAVENOUS

## 2019-12-11 MED ORDER — LIVING WELL WITH DIABETES BOOK
Freq: Once | Status: AC
  Filled 2019-12-11: qty 1

## 2019-12-11 SURGICAL SUPPLY — 14 items
CATH INFINITI 5 FR 3DRC (CATHETERS) ×1 IMPLANT
CATH INFINITI 5 FR IM (CATHETERS) ×1 IMPLANT
CATH INFINITI 5 FR JL3.5 (CATHETERS) ×1 IMPLANT
CATH INFINITI 5FR MULTPACK ANG (CATHETERS) ×1 IMPLANT
DEVICE RAD COMP TR BAND LRG (VASCULAR PRODUCTS) ×1 IMPLANT
GLIDESHEATH SLEND A-KIT 6F 22G (SHEATH) ×1 IMPLANT
GUIDEWIRE INQWIRE 1.5J.035X260 (WIRE) IMPLANT
INQWIRE 1.5J .035X260CM (WIRE) ×2
KIT HEART LEFT (KITS) ×2 IMPLANT
PACK CARDIAC CATHETERIZATION (CUSTOM PROCEDURE TRAY) ×2 IMPLANT
SHEATH PROBE COVER 6X72 (BAG) ×1 IMPLANT
TRANSDUCER W/STOPCOCK (MISCELLANEOUS) ×2 IMPLANT
TUBING CIL FLEX 10 FLL-RA (TUBING) ×2 IMPLANT
WIRE HI TORQ VERSACORE-J 145CM (WIRE) ×1 IMPLANT

## 2019-12-11 NOTE — Plan of Care (Signed)

## 2019-12-11 NOTE — Progress Notes (Signed)
Patient has home CPAP at bedside that he places on himself.  RT instructed patient to have RT called if assistance is needed. RT will monitor as needed.

## 2019-12-11 NOTE — Progress Notes (Signed)
Inpatient Diabetes Program Recommendations  AACE/ADA: New Consensus Statement on Inpatient Glycemic Control (2015)  Target Ranges:  Prepandial:   less than 140 mg/dL      Peak postprandial:   less than 180 mg/dL (1-2 hours)      Critically ill patients:  140 - 180 mg/dL   Lab Results  Component Value Date   GLUCAP 105 (H) 12/11/2019   HGBA1C 10.8 (H) 12/09/2019     Diabetes history:  T2DM Outpatient Diabetes medications:  70/30 66 units qam and 60 units qpm Victoza 1.8 mg daily Metformin 1000 mg bid Current orders for Inpatient glycemic control:  70/30 60 units bid Novolog 0-15 units tid & 0-5 units qhs  Note: Spoke with spouse at bedside while patient was sleeping.  She confirms above home medications.  He takes his medications usually as prescribed but occasionally forgets in the evening.  He has a glucometer but does not check his blood sugars unless he feels low.  Spouse states she is going to start monitoring his medication compliance.  They eat out a lot and she states they need to eat more at home.  Discussed The Plate Method with her.  Reviewed patient's current A1c of 10.8% (average blood sugar of 263 mg/dL)  This is down from 11.7% in September. Explained what a A1c is and what it measures. Also reviewed goal A1c with patient, importance of good glucose control @ home, and blood sugar goals.  Discussed importance of good blood glucose control to avoid short and long term complications.  Asked for him to check his blood sugar twice daily.  He rarely has hypoglycemia but he is aware when he is low and he treats with glucose tabs.  He rotates his sites appropriately.    Ordered Living Well with Diabetes booklet.    Will continue to follow while inpatient.  Thank you, Reche Dixon, RN, BSN Diabetes Coordinator Inpatient Diabetes Program [phone] (team pager from 8a-5p)

## 2019-12-11 NOTE — Significant Event (Signed)
Bloody in urine cranberry colored amber urine. stopped Heparin drip.

## 2019-12-11 NOTE — Interval H&P Note (Signed)
History and Physical Interval Note:  12/11/2019 9:26 AM  Martin Bush  has presented today for surgery, with the diagnosis of nstemi.  The various methods of treatment have been discussed with the patient and family. After consideration of risks, benefits and other options for treatment, the patient has consented to  Procedure(s): LEFT HEART CATH AND CORS/GRAFTS ANGIOGRAPHY (N/A) as a surgical intervention.  The patient's history has been reviewed, patient examined, no change in status, stable for surgery.  I have reviewed the patient's chart and labs.  Questions were answered to the patient's satisfaction.    2016 Appropriate Use Criteria for Coronary Revascularization in Patients With Acute Coronary Syndrome NSTEMI/UA High Risk (TIMI Score 5-7) NSTEMI/Unstable angina, stabilized patient at high risk Link Here: sistemancia.com Indication:  Revascularization by PCI or CABG of 1 or more arteries in a patient with NSTEMI or unstable angina with Stabilization after presentation High risk for clinical events  A (7) Indication: 16; Score 7    [place]

## 2019-12-12 ENCOUNTER — Telehealth: Payer: Self-pay

## 2019-12-12 ENCOUNTER — Other Ambulatory Visit: Payer: Self-pay

## 2019-12-12 LAB — BASIC METABOLIC PANEL
Anion gap: 6 (ref 5–15)
BUN: 31 mg/dL — ABNORMAL HIGH (ref 8–23)
CO2: 28 mmol/L (ref 22–32)
Calcium: 8.9 mg/dL (ref 8.9–10.3)
Chloride: 101 mmol/L (ref 98–111)
Creatinine, Ser: 1.45 mg/dL — ABNORMAL HIGH (ref 0.61–1.24)
GFR, Estimated: 54 mL/min — ABNORMAL LOW (ref >60)
Glucose, Bld: 106 mg/dL — ABNORMAL HIGH (ref 70–99)
Potassium: 4.2 mmol/L (ref 3.5–5.1)
Sodium: 135 mmol/L (ref 135–145)

## 2019-12-12 LAB — CBC
HCT: 32.4 % — ABNORMAL LOW (ref 39.0–52.0)
Hemoglobin: 10.3 g/dL — ABNORMAL LOW (ref 13.0–17.0)
MCH: 29.9 pg (ref 26.0–34.0)
MCHC: 31.8 g/dL (ref 30.0–36.0)
MCV: 94.2 fL (ref 80.0–100.0)
Platelets: 195 10*3/uL (ref 150–400)
RBC: 3.44 MIL/uL — ABNORMAL LOW (ref 4.22–5.81)
RDW: 13.5 % (ref 11.5–15.5)
WBC: 6.8 10*3/uL (ref 4.0–10.5)
nRBC: 0 % (ref 0.0–0.2)

## 2019-12-12 LAB — GLUCOSE, CAPILLARY
Glucose-Capillary: 52 mg/dL — ABNORMAL LOW (ref 70–99)
Glucose-Capillary: 81 mg/dL (ref 70–99)
Glucose-Capillary: 74 mg/dL (ref 70–99)

## 2019-12-12 NOTE — Telephone Encounter (Signed)
Location of hospitalization: [place] Reason for hospitalization: Pulmonary edema  Date of discharge: 12/12/19 Date of first communication with patient: today Person contacting patient: Me Current symptoms: Pt states he is feeling much better  Do you understand why you were in the Hospital: Yes Questions regarding discharge instructions: None Where were you discharged to: Home Medications reviewed: Yes Allergies reviewed: Yes Dietary changes reviewed: Yes. Discussed low fat and low salt diet.  Referals reviewed: NA Activities of Daily Living: Able to with mild limitations Any transportation issues/concerns: None Any patient concerns: None Confirmed importance & date/time of Follow up appt: Yes Confirmed with patient if condition begins to worsen call. Pt was given the office number and encouraged to call back with questions or concerns: Yes

## 2019-12-12 NOTE — TOC Transition Note (Signed)
Transition of Care [facility]) - CM/SW Discharge Note   Patient Details  Name: Martin Bush MRN: [id number] Date of Birth: 01/08/1956  [facility] CM/SW Contact:  Zenon Mayo, RN Phone Number: 12/12/2019, 10:04 AM   Clinical Narrative:    Patient is for dc today, NCM spoke with patient and wife he states he does not need any HH services. He will not have any issues getting his medications, he has transportation at dc.    Final next level of care: Home/Self Care Barriers to Discharge: No Barriers Identified   Patient Goals and CMS Choice        Discharge Placement                       Discharge Plan and Services                  DME Agency: NA       HH Arranged: NA          Social Determinants of Health (SDOH) Interventions     Readmission Risk Interventions Readmission Risk Prevention Plan 01/04/2019  Transportation Screening Complete  Home Care Screening Complete  Some recent data might be hidden

## 2019-12-12 NOTE — Discharge Summary (Signed)
Physician Discharge Summary  Patient ID: Martin Bush MRN: [id number] DOB/AGE: 1955/06/05 64 y.o.  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 caucasian male  with history of coronary artery disease with CABG x2 ischemic cardiomyopathy, diabetes type 2 with chronic kidney disease stage III and history of ventricular tachycardia status post BiV ICD.  He initially presented to the emergency department with complaints of shortness of breath.  Work-up in the ED indicated acute exacerbation of heart failure with pulmonary edema as well as elevated troponin suspicious for non-ST elevation MI.  He was successfully diuresed with significant improvement of symptoms.  Underwent cardiac catheterization which revealed patent Lm/LCx stent and patent LIMA to LAD graft, otherwise no change in coronary anatomy compared to prior catheterization 09/2019.   Patient has since stabilized with diuresis of 2.8L leading to resolution of symptoms.   Discharge Exam: Blood pressure 113/69, pulse 63, temperature 97.8 F (36.6 C), temperature source Oral, resp. rate 16, height 5\' 7"  (1.702 m), weight 101.4 kg, SpO2 97 %.   Physical Exam Vitals reviewed.  HENT:     Head: Normocephalic and atraumatic.     Right Ear: External ear normal.     Left Ear: External ear normal.     Nose: Nose normal.  Eyes:     Extraocular Movements: Extraocular movements intact.  Neck:     Vascular: No carotid bruit or JVD.  Cardiovascular:     Rate and Rhythm: Normal rate and regular rhythm.     Pulses: Intact distal pulses.     Heart sounds: S1 normal and S2 normal. No murmur  heard.  No gallop.      Comments: 1+ bilaterally lower leg edema to below knee.  Radial catheter insertion site without erythema, ecchymosis, hematoma, or bruit.  Pulmonary:     Effort: Pulmonary effort is normal. No respiratory distress.     Breath sounds: No wheezing, rhonchi or rales.  Abdominal:     Palpations: Abdomen is soft.     Tenderness: There is no abdominal tenderness.  Musculoskeletal:     Right lower leg: Edema present.     Left lower leg: Edema present.  Skin:    General: Skin is warm.     Findings: No bruising or erythema.  Neurological:     General: No focal deficit present.     Mental Status: He is alert and oriented to person, place, and time.  Psychiatric:        Mood and Affect: Mood normal.        Behavior: Behavior normal.     Recommendations on discharge:   Acute on chronic systolic heart failure exacerbation likely due to patient noncompliance with torsemide prescribed outpatient and elevated troponin not due to NSTEMI but rather acute exacerbation of heart failure. Patient now able to ambulate without supplemental oxygen and without hypoxia.  Continue guideline directed medical therapy.  Continue dual antiplatelet therapy with aspirin and Brilinta.  Recommend diet and lifestyle modifications for weight loss and for improvement of modifiable cardiovascular risk factors, including salt restriction.  Discussed importance of medication compliance with patient.  Close follow up with cardiology outpatient: Appointment 12/17/2019 at 2:00pm.    Significant Diagnostic  Studies:  EKG 12/09/2019: AV paced rhythm at rate of 75 bpm. No further analysis.  Renal artery duplex 09/02/2017: Right: Abnormal right Resistive Index. No evidence of right renal  artery stenosis. Left: Normal left Resistive Index. No evidence of left renal artery stenosis. Mesenteric: Normal Celiac artery findings. 70 to 99% stenosis in the superior mesenteric artery.  Left Heart  Catheterization12/14/2020: 1. Severe underlying three-vessel coronary artery disease with patent LIMA to LAD. Chronically occluded SVG to OM. Severe in-stent restenosis in left main stent extending into the proximal left circumflex. In addition, there is significant restenosis in the stent placed in the left posterior AV groove artery. The RCA is known to be small in size and severely diseased. 2. Left ventricular angiography was not performed due to chronic kidney disease. LVEDP was 13 mmHg.  Coronary angioplasty of the left main and mid circumflex coronary artery 01/11/2019: Successful Wolverine cutting balloon angioplasty of the left main, 3.5 x 10 mm balloon utilized and high-pressure 12 atmospheric pressure inflations performed throughout the in-stent restenotic left main and proximal ostial circumflex and also mid circumflex coronary artery, 99% reduced to 0% with TIMI II to TIMI-3 flow. 60 mill contrast utilized.  Echocardiogram 06/15/2019:  Left ventricle cavity is moderately dilated. Moderate concentric  hypertrophy of the left ventricle. Doppler evidence of grade III  (restrictive) diastolic dysfunction, elevated LAP. Left ventricle regional  wall motion findings: Basal inferolateral akinesis. 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 Mild global hypokinesis. Abnormal septal wall motion due  to post-operative coronary artery bypass graft. Moderately depressed LV  systolic function with visual EF 35-40%.  Left atrial cavity is mildly dilated at 4.3 cm.  Right ventricle cavity is normal in size. Normal right ventricular  function. Pacemaker lead/ICD lead noted in the RV.  Trileaflet aortic valve. Mild (Grade I) aortic regurgitation.  Compared to 01/04/2019, EF improved from 25-30%  Left Heart Catheterization 10/09/2019:  LV: Normal LVEDP. There was no pressure gradient across the aortic valve. Angiogram not performed to conserve contrast. Right coronary artery nondominant and  severely diffusely diseased unchanged from prior cardiac catheterization. Left main: Mid to distal left main has in-stent restenosis of 99% and extends into the dominant circumflex which also has proximal in-stent 99% stenosis with TIMI II flow. Successful scoring balloon angioplasty with 3.5 x 15 mm Wolverine at high 14 atmospheric pressure, due to recall, haziness, stented with 4.0 x 18 mm resolute Onyx DES at 18 atmospheric pressure for 60 seconds, stenosis reduced in the left main and proximal in-stent restenosis from 99% to 0% with improvement in TIMI flow from 2-3. Successful PTCA and Cutting Balloon angioplasty with Wolverine 3.0 x 10 mm balloon in the mid segment of the dominant circumflex coronary artery in-stent restenosis. Stenosis reduced to 0% at 14 atmospheric pressure inflations x2. TIMI-3 to TIMI-3 flow maintained. LAD: Is occluded in the proximal segment. Distal LAD supplied by LIMA. LIMA to LAD is widely patent.  Left heart catheterization and angiography 12/11/2019:  LM: Protected. Patent LM/LCx stent LAD: Prox 100% occluded. Bypassed by patent LIMA attaching in mid LAD        Mid to distal LAD 50% disease LCxL: Patent LM/LCx stent. OM3 60% stenosis RCA: Small caliber with severe diffuse disease         Dampening of the pressures noted on engagement  No change in coronary anatomy compared to prior cath in 09/2019 Current presentation likely due to acute on chronic HFrEF Continue guideline directed medical therapy Stop heparin. Resume brilinta with  180 mg loading dose, then 90 mg bid.  Labs:   Lab Results  Component Value Date   WBC 6.8 12/12/2019   HGB 10.3 (L) 12/12/2019   HCT 32.4 (L) 12/12/2019   MCV 94.2 12/12/2019   PLT 195 12/12/2019    Recent Labs  Lab 12/12/19 0337  NA 135  K 4.2  CL 101  CO2 28  BUN 31*  CREATININE 1.45*  CALCIUM 8.9  GLUCOSE 106*    Lipid Panel     Component Value Date/Time   CHOL 127 04/18/2019 1019   TRIG 181 (H)  04/18/2019 1019   HDL 39 (L) 04/18/2019 1019   CHOLHDL 3.2 01/04/2019 0640   VLDL 30 01/04/2019 0640   LDLCALC 58 04/18/2019 1019    BNP (last 3 results) Recent Labs    10/09/19 0005 12/06/19 1431 12/09/19 0243  BNP 2,026.8* 720.3* 910.0*    HEMOGLOBIN A1C Lab Results  Component Value Date   HGBA1C 10.8 (H) 12/09/2019   MPG 263.26 12/09/2019    Cardiac Panel (last 3 results) No results for input(s): CKTOTAL, CKMB, TROPONINI, RELINDX in the last 8760 hours.  Lab Results  Component Value Date   TROPONINI 0.06 ([place]) 08/03/2017     TSH Recent Labs    04/18/19 1019 10/09/19 0559  TSH 1.460 1.422    Radiology: CARDIAC CATHETERIZATION  Addendum Date: 12/11/2019   LM: Protected. Patent LM/LCx stent LAD: Prox 100% occluded. Bypassed by patent LIMA attaching in mid LAD        Mid to distal LAD 50% disease LCxL: Patent LM/LCx stent. OM3 60% stenosis RCA: Small caliber with severe diffuse disease         Dampening of the pressures noted on engagement No change in coronary anatomy compared to prior cath in 09/2019 Current presentation likely due to acute on chronic HFrEF Continue guideline directed medical therapy Stop heparin. Resume brilinta with 180 mg loading dose, then 90 mg bid. Nigel Mormon, MD Pager: [phone] Office: [phone]   Addendum Date: 12/11/2019   LM: Protected. Patent LM/LCx stent LAD: Prox 100% occluded. Bypassed by patent LIMA attaching in mid LAD        Mid to distal LAD 50% disease LCxL: Patent LM/LCx stent. OM3 60% stenosis RCA: Small caliber with severe diffuse disease         Dampening of the pressures noted on engagement No change in coronary anatomy compared to prior cath in 09/2019 Current presentation likely due to acute on chronic HFrEF Continue guideline directed medical therapy Stop heparin. Resume brilinta with 180 mg loading dose, then 90 mg bid. Nigel Mormon, MD Pager: [phone] Office: [phone]   Result Date:  12/11/2019 LM: Protected. Patent LM/LCx stent LAD: Prox 100% occluded. Bypassed by patent LIMA attaching in mid LAD        Mid to distal LAD 50% disease LCxL: Patent LM/LCx stent. OM3 60% stenosis RCA: Small caliber with severe diffuse disease         Dampening of the pressures noted on engagement No change in coronary anatomy compared to prior cath in 09/2019 Current presentation likely due to acute on chronic HFrEF Continue guideline directed medical therapy Nigel Mormon, MD Pager: [phone] Office: [phone]   DG Chest Port 1 View  Result Date: 12/09/2019 CLINICAL DATA:  Respiratory distress EXAM: PORTABLE CHEST 1 VIEW COMPARISON:  10/08/2019 FINDINGS: The lungs are symmetrically well expanded. There are bilateral perihilar airspace infiltrates present, as well as trace right basilar peripheral interstitial pulmonary  infiltrate most in keeping with moderate mixed alveolar and interstitial pulmonary edema, likely cardiogenic in nature. There is no pneumothorax or pleural effusion. Coronary artery bypass grafting and coronary artery stenting has been performed. Mild to moderate cardiomegaly is stable. Left subclavian pacemaker defibrillator is unchanged. IMPRESSION: Bilateral perihilar and lower lung zone pulmonary infiltrates most in keeping with moderate probable cardiogenic pulmonary edema. Electronically Signed   By: Fidela Salisbury MD   On: 12/09/2019 02:41      FOLLOW UP PLANS AND APPOINTMENTS Discharge Instructions    (HEART FAILURE PATIENTS) Call MD:  Anytime you have any of the following symptoms: 1) 3 pound weight gain in 24 hours or 5 pounds in 1 week 2) shortness of breath, with or without a dry hacking cough 3) swelling in the hands, feet or stomach 4) if you have to sleep on extra pillows at night in order to breathe.   Complete by: As directed    Diet - low sodium heart healthy   Complete by: As directed    Heart Failure patients record your daily weight using the same  scale at the same time of day   Complete by: As directed    Increase activity slowly   Complete by: As directed      Allergies as of 12/12/2019      Reactions   Diltiazem Rash      Medication List    TAKE these medications   amiodarone 200 MG tablet Commonly known as: PACERONE Take 0.5 tablets (100 mg total) by mouth daily. What changed: how much to take   aspirin 81 MG EC tablet Take 1 tablet (81 mg total) by mouth daily.   carvedilol 12.5 MG tablet Commonly known as: COREG Take 1 tablet (12.5 mg total) by mouth 2 (two) times daily.   Cyanocobalamin 2500 MCG Chew Chew 2,500 mcg by mouth daily.   Fish Oil 1000 MG Caps Take 2,000 mg by mouth 3 (three) times daily.   insulin aspart protamine- aspart (70-30) 100 UNIT/ML injection Commonly known as: NOVOLOG MIX 70/30 Inject 0.66 mLs (66 Units total) into the skin daily with breakfast AND 0.6 mLs (60 Units total) daily with supper. What changed: See the new instructions.   isosorbide mononitrate 30 MG 24 hr tablet Commonly known as: IMDUR Take 1 tablet (30 mg total) by mouth daily.   metFORMIN 500 MG 24 hr tablet Commonly known as: GLUCOPHAGE-XR Take 1,000 mg by mouth 2 (two) times daily.   metFORMIN 500 MG tablet Commonly known as: GLUCOPHAGE Take 2 tablets (1,000 mg total) by mouth 2 (two) times daily with a meal. Taking 2 tabs twice a day   nitroGLYCERIN 0.4 MG SL tablet Commonly known as: NITROSTAT Place 1 tablet (0.4 mg total) under the tongue every 5 (five) minutes x 3 doses as needed for chest pain.   OXYGEN Inhale 2 L/min into the lungs See admin instructions. 2 L/min at bedtime in conjunction with CPAP   potassium chloride SA 20 MEQ tablet Commonly known as: KLOR-CON TAKE 1 TABLET BY MOUTH TWICE A DAY   PRESCRIPTION MEDICATION Inhale into the lungs See admin instructions. CPAP- At bedtime   rosuvastatin 40 MG tablet Commonly known as: CRESTOR Take 1 tablet (40 mg total) by mouth at bedtime.    sacubitril-valsartan 97-103 MG Commonly known as: ENTRESTO Take 1 tablet by mouth 2 (two) times daily.   sertraline 100 MG tablet Commonly known as: ZOLOFT Take 50 mg by mouth daily.   sertraline 50  MG tablet Commonly known as: ZOLOFT Take 1 tablet (50 mg total) by mouth at bedtime.   ticagrelor 90 MG Tabs tablet Commonly known as: BRILINTA Take 1 tablet (90 mg total) by mouth 2 (two) times daily.   torsemide 20 MG tablet Commonly known as: DEMADEX TAKE 1 TABLET BY MOUTH TWICE A DAY What changed:   when to take this  reasons to take this   Victoza 18 MG/3ML Sopn Generic drug: liraglutide Inject 1.8 mg into the skin daily.   Vitamin D3 50 MCG (2000 UT) Tabs Take 2,000 Units by mouth 2 (two) times daily.       Follow-up Information    Alethia Berthold, PA-C Follow up on 12/17/2019.   Specialty: Cardiology Why: Appointment 2pm Contact information: [place] [address] [place], Hershal Coria 12/12/2019, 9:33 AM Office: [phone]

## 2019-12-12 NOTE — Progress Notes (Signed)
SATURATION QUALIFICATIONS: (This note is used to comply with regulatory documentation for home oxygen)  Patient Saturations on Room Air at Rest = 96%  Patient Saturations on Room Air while Ambulating = 92 %  Patient Saturations on 0 Liters of oxygen while Ambulating = 96%  Please briefly explain why patient needs home oxygen: patient needs 02 with cpap

## 2019-12-12 NOTE — Telephone Encounter (Signed)
or

## 2019-12-12 NOTE — Plan of Care (Signed)

## 2019-12-12 NOTE — Telephone Encounter (Signed)
error 

## 2019-12-12 NOTE — Progress Notes (Signed)
D/C instructions given and reviewed. No questions asked but encouraged to call with any questions. Tele and IV removed, tolerated well. 

## 2019-12-12 NOTE — Progress Notes (Signed)
CBG Rechecked. Pt stated that he's feeling better.

## 2019-12-12 NOTE — Consult Note (Signed)
   [facility] CM Inpatient Consult   12/12/2019  Martin Bush 04/19/1955 [id number]   [place] noted.  Patient just transitioned home, patient will be followed by [place] in [place].  Natividad Brood, RN BSN [facility] Liaison  [phone] business mobile phone Toll free office [phone]  Fax number: [phone] [email or web address]

## 2019-12-12 NOTE — Progress Notes (Signed)
Pt woke up feeling shaky stating that his CBG was low. CBG checked. Juice and snack given. Will recheck in 15 minutes.

## 2019-12-13 ENCOUNTER — Telehealth: Payer: Self-pay

## 2019-12-13 NOTE — Telephone Encounter (Signed)
Patient notes he is doing okay and plans to follow up with Cardiology only at this time. TCM declined. Agrees to call primary care provider as needed.

## 2019-12-17 ENCOUNTER — Other Ambulatory Visit: Payer: Self-pay

## 2019-12-17 ENCOUNTER — Ambulatory Visit: Payer: No Typology Code available for payment source | Admitting: Student

## 2019-12-17 ENCOUNTER — Encounter: Payer: Self-pay | Admitting: Student

## 2019-12-17 DIAGNOSIS — I5042 Chronic combined systolic (congestive) and diastolic (congestive) heart failure: Secondary | ICD-10-CM

## 2019-12-17 DIAGNOSIS — I255 Ischemic cardiomyopathy: Secondary | ICD-10-CM

## 2019-12-17 DIAGNOSIS — I25810 Atherosclerosis of coronary artery bypass graft(s) without angina pectoris: Secondary | ICD-10-CM

## 2019-12-17 DIAGNOSIS — I10 Essential (primary) hypertension: Secondary | ICD-10-CM

## 2019-12-17 MED ORDER — CARVEDILOL 12.5 MG PO TABS: 25.0000 mg | ORAL_TABLET | Freq: Two times a day (BID) | ORAL | 3 refills | Status: DC

## 2019-12-17 NOTE — Progress Notes (Signed)
Primary Physician/Referring:  [name], MD  Patient ID: Martin Bush, male    DOB: 1955/11/12, 64 y.o.   MRN: [id number]  Chief Complaint  Patient presents with  . Coronary Artery Disease  . Follow-up   HPI:    Martin Bush  is a 64 y.o. Caucasian male with CAD and history of CABG x2 in 2002  with only LIMA to LAD being patent, Circumflex coronary artery is super dominant. History of stenting to circumflex/OM bifurcation due to recurrent restenosis in 2015, left main and circumflex proximal and midsegment in 2016, angioplasty to left main and proximal and mid circumflex in 2020 and again 10/08/2019.  He also has a history of VT with ICD, ischemic cardiomyopathy, chronic systolic and diastolic heart failure.  He was admitted to the hospital on 10/08/2019 with acute pulmonary edema and non-ST elevation MI for which she underwent repeat angioplasty and stenting to left main and left circumflex coronary arteries.  He presented to the hospital again 12/09/2018 and acute on chronic heart failure repeat angiography revealed patent stents and no change in coronary anatomy.  Patient was successfully diuresed and discharged home.  Patient presents for hospital follow-up.  He reports he is presently feeling well.  Denies symptoms of chest pain, palpitations, dyspnea, leg swelling, orthopnea, PND.  He reports he has been compliant with all medications and has made significant improvements to his diet and lifestyle also seems highly motivated to focus on controlling his diabetes, and reports improved blood glucose levels over the last week or so.  He continues to tolerate guideline directed medical therapy.  Past Medical History:  Diagnosis Date  . AICD (automatic cardioverter/defibrillator) present    Medtronic  . Arthritis    "thumbs"  (08/02/2017)  . CHF (congestive heart failure) ([place])   . Chronic combined systolic and diastolic heart failure ([place])    a. 08/2017 Echo: EF 20-25%, mod  glob HK. Sev distal ant sept, inflat HK. Apical AK. Gr2 DD. Mildly reduced RV fxn.  . Colon polyps   . Coronary artery disease    a. s/p CABG x 2 (LIMA->LAD, VG->OM); b. Multiple PCI's to LM/LCX/OM; c. 07/2017 PTCA of LM/LCX w/ early ISR-->repeat PCI/DES to LM (3.5x20 Synergy DES) and LCX (3.0x20 Synergy DES); d. 07/2018 Relook Cath: LM patent stent, LAD 100ost, LCX patent stent, OM1 99/60, LIMA->LAD ok. VG->dLCX 61 (old).  . Depression   . Encounter for assessment of implantable cardioverter-defibrillator (ICD) 09/27/2018  . High cholesterol   . Hypertension   . ICD; Biventricular  Medtronic ICD Amplia MRI QWuad CRT-D  in situ 10/29/14 10/29/2014   Remote ICD check 09.23.20:  One 6 beat NSVT. No therapy.  1 SVT episode @ 130 bpm (38 Sec).  There were 23 Vent sense episodes detected for up to 1.1 min/day (AT). Health trends (patient activity, heart rate variability, average heart rates) are stable.Trans-thoracic impedance trends and the OptiVol Fluid Index do no present significant abnormalities. Battery longevity is 4.3 years. RA pacing is 3  . Ischemic cardiomyopathy 09/27/2018  . MI (myocardial infarction) ([place]) 2003  . NSTEMI (non-ST elevated myocardial infarction) ([place]) 07/16/2014  . OSA on CPAP   . Oxygen deficiency   . Pneumonia 10/2014  . Proteinuria   . Sleep apnea   . Type II diabetes mellitus (HCC)    insulin dependent   Past Surgical History:  Procedure Laterality Date  . BIOPSY  09/19/2018   Procedure: BIOPSY;  Surgeon: Thornton Park, MD;  Location: [facility];  Service: Gastroenterology;;  . CARDIAC CATHETERIZATION N/A 07/16/2014   Procedure: Left Heart Cath and Coronary Angiography;  Surgeon: Adrian Prows, MD;  Location: [place] [facility];  Service: Cardiovascular;  Laterality: N/A;  . CARDIAC CATHETERIZATION  07/16/2014   Procedure: Coronary Balloon Angioplasty;  Surgeon: Adrian Prows, MD;  Location: [place] [facility];  Service: Cardiovascular;;  . CARDIAC CATHETERIZATION  2003   . CARDIAC DEFIBRILLATOR PLACEMENT  2016  . CATARACT EXTRACTION W/ INTRAOCULAR LENS IMPLANT Left 03/2014  . COLONOSCOPY    . COLONOSCOPY WITH PROPOFOL N/A 09/19/2018   Procedure: COLONOSCOPY WITH PROPOFOL;  Surgeon: Thornton Park, MD;  Location: [facility];  Service: Gastroenterology;  Laterality: N/A;  . CORONARY ANGIOPLASTY WITH STENT PLACEMENT     "I've got a total of 8 stents in there; mostly doine at [facility]" (08/02/2017)  . CORONARY ARTERY BYPASS GRAFT  ~ 2003   "CABG X2"; [facility]  . CORONARY ATHERECTOMY N/A 08/16/2017   Procedure: CORONARY ATHERECTOMY;  Surgeon: Nigel Mormon, MD;  Location: [place] [facility];  Service: Cardiovascular;  Laterality: N/A;  . CORONARY BALLOON ANGIOPLASTY N/A 08/04/2017   Procedure: CORONARY BALLOON ANGIOPLASTY;  Surgeon: Adrian Prows, MD;  Location: [place] [facility];  Service: Cardiovascular;  Laterality: N/A;  . CORONARY BALLOON ANGIOPLASTY N/A 01/11/2019   Procedure: CORONARY BALLOON ANGIOPLASTY;  Surgeon: Adrian Prows, MD;  Location: [place] [facility];  Service: Cardiovascular;  Laterality: N/A;  . CORONARY BALLOON ANGIOPLASTY N/A 10/09/2019   Procedure: CORONARY BALLOON ANGIOPLASTY;  Surgeon: Adrian Prows, MD;  Location: [place] [facility];  Service: Cardiovascular;  Laterality: N/A;  . CORONARY STENT INTERVENTION N/A 08/16/2017   Procedure: CORONARY STENT INTERVENTION;  Surgeon: Nigel Mormon, MD;  Location: [place] [facility];  Service: Cardiovascular;  Laterality: N/A;  . CORONARY STENT INTERVENTION N/A 10/09/2019   Procedure: CORONARY STENT INTERVENTION;  Surgeon: Adrian Prows, MD;  Location: [place] [facility];  Service: Cardiovascular;  Laterality: N/A;  . ELBOW SURGERY Left ?2001   "pinched nerve"  . ENDOSCOPIC MUCOSAL RESECTION  12/18/2018   Procedure: ENDOSCOPIC MUCOSAL RESECTION;  Surgeon: Rush Landmark Telford Nab., MD;  Location: [place];  Service: Gastroenterology;;  . Otho Darner SIGMOIDOSCOPY N/A 12/18/2018    Procedure: Beryle Quant;  Surgeon: Irving Copas., MD;  Location: [place];  Service: Gastroenterology;  Laterality: N/A;  . HEMOSTASIS CLIP PLACEMENT  12/18/2018   Procedure: HEMOSTASIS CLIP PLACEMENT;  Surgeon: Irving Copas., MD;  Location: [place];  Service: Gastroenterology;;  . LEFT HEART CATH AND CORONARY ANGIOGRAPHY N/A 01/11/2019   Procedure: LEFT HEART CATH AND CORONARY ANGIOGRAPHY;  Surgeon: Adrian Prows, MD;  Location: [place] [facility];  Service: Cardiovascular;  Laterality: N/A;  . LEFT HEART CATH AND CORS/GRAFTS ANGIOGRAPHY N/A 08/04/2017   Procedure: LEFT HEART CATH AND CORS/GRAFTS ANGIOGRAPHY;  Surgeon: Adrian Prows, MD;  Location: [place] [facility];  Service: Cardiovascular;  Laterality: N/A;  . LEFT HEART CATH AND CORS/GRAFTS ANGIOGRAPHY N/A 08/20/2017   Procedure: LEFT HEART CATH AND CORS/GRAFTS ANGIOGRAPHY;  Surgeon: Nigel Mormon, MD;  Location: [place] [facility];  Service: Cardiovascular;  Laterality: N/A;  . LEFT HEART CATH AND CORS/GRAFTS ANGIOGRAPHY N/A 01/08/2019   Procedure: LEFT HEART CATH AND CORS/GRAFTS ANGIOGRAPHY;  Surgeon: Wellington Hampshire, MD;  Location: [place] [facility];  Service: Cardiovascular;  Laterality: N/A;  . LEFT HEART CATH AND CORS/GRAFTS ANGIOGRAPHY N/A 10/09/2019   Procedure: LEFT HEART CATH AND CORS/GRAFTS ANGIOGRAPHY;  Surgeon: Adrian Prows, MD;  Location: [place] [facility];  Service: Cardiovascular;  Laterality: N/A;  . LEFT HEART CATH AND CORS/GRAFTS ANGIOGRAPHY N/A 12/11/2019   Procedure: LEFT HEART CATH AND CORS/GRAFTS ANGIOGRAPHY;  Surgeon: Nigel Mormon, MD;  Location: [place] [facility];  Service: Cardiovascular;  Laterality: N/A;  . POLYPECTOMY  09/19/2018   Procedure: POLYPECTOMY;  Surgeon: Thornton Park, MD;  [facility];  Service: Gastroenterology;;  . Lia Foyer INJECTION  09/19/2018   Procedure: SUBMUCOSAL INJECTION;  Surgeon: Thornton Park, MD;  [facility];   Service: Gastroenterology;;  . Lia Foyer LIFTING INJECTION  12/18/2018   Procedure: SUBMUCOSAL LIFTING INJECTION;  Surgeon: Irving Copas., MD;  Location: [facility] ENDOSCOPY;  Service: Gastroenterology;;    Family History  Problem Relation Age of Onset  . Uterine cancer Mother   . Lung cancer Mother   . Hyperlipidemia Father   . Heart disease Father   . Hypertension Father   . Diabetes Father   . Colon cancer Neg Hx   . Esophageal cancer Neg Hx   . Colon polyps Neg Hx   . Rectal cancer Neg Hx   . Stomach cancer Neg Hx   . Inflammatory bowel disease Neg Hx   . Liver disease Neg Hx   . Pancreatic cancer Neg Hx    Social History   Tobacco Use  . Smoking status: Former Smoker    Packs/day: 0.25    Years: 27.00    Pack years: 6.75    Types: Cigars    Quit date: 2002    Years since quitting: 19.9  . Smokeless tobacco: Former Systems developer    Quit date: 2002  Substance Use Topics  . Alcohol use: Not Currently    Marital Status: Married ROS  Review of Systems  Cardiovascular: Negative for chest pain, claudication, dyspnea on exertion, leg swelling, orthopnea, palpitations, paroxysmal nocturnal dyspnea and syncope.  Respiratory: Negative for shortness of breath.   Hematologic/Lymphatic: Does not bruise/bleed easily.  Gastrointestinal: Negative for melena.  Neurological: Negative for dizziness.   Objective  Blood pressure 134/74, pulse 73, resp. rate 16, height 5\' 7"  (1.702 m), weight 214 lb (97.1 kg), SpO2 96 %.  Vitals with BMI 12/17/2019 12/12/2019 12/12/2019  Height 5\' 7"  - -  Weight 214 lbs - -  BMI 93.26 - -  Systolic 712 - 458  Diastolic 74 - 76  Pulse 73 97 60     Physical Exam Vitals reviewed.  Constitutional:      Comments: He is moderately built and mildly obese in no acute distress.  Cardiovascular:     Rate and Rhythm: Normal rate and regular rhythm.     Pulses:          Carotid pulses are 1+ on the right side with bruit and 1+ on the left side with  bruit.      Femoral pulses are 2+ on the right side and 2+ on the left side.      Popliteal pulses are 1+ on the right side and 1+ on the left side.       Dorsalis pedis pulses are 0 on the right side and 0 on the left side.       Posterior tibial pulses are 1+ on the right side and 0 on the left side.     Heart sounds: Normal heart sounds.     Comments: No JVD.  Minimal edema below knee bilaterally.  Pulmonary:     Effort: Pulmonary effort is normal. No accessory muscle usage or  respiratory distress.     Breath sounds: Normal breath sounds.    Laboratory examination:   Recent Labs    10/23/19 1347 10/23/19 1347 11/14/19 1514 11/14/19 1514 12/06/19 1431 12/09/19 0252 12/10/19 0218 12/11/19 0414 12/12/19 0337  NA 136   < > 138   < > 141   < > 133* 136 135  K 4.6   < > 4.8   < > 5.1   < > 3.9 3.9 4.2  CL 100   < > 102   < > 103   < > 100 102 101  CO2 20   < > 21   < > 24   < > 25 25 28   GLUCOSE 356*   < > 362*   < > 377*   < > 283* 102* 106*  BUN 28*   < > 24   < > 17   < > 27* 32* 31*  CREATININE 1.32*   < > 1.43*   < > 1.30*   < > 1.66* 1.57* 1.45*  CALCIUM 9.4   < > 9.3   < > 8.9   < > 8.6* 8.8* 8.9  GFRNONAA 57*   < > 52*   < > 58*   < > 46* 49* 54*  GFRAA 66  --  60  --  67  --   --   --   --    < > = values in this interval not displayed.   estimated creatinine clearance is 57.9 mL/min (A) (by C-G formula based on SCr of 1.45 mg/dL (H)).  CMP Latest Ref Rng & Units 12/12/2019 12/11/2019 12/10/2019  Glucose 70 - 99 mg/dL 106(H) 102(H) 283(H)  BUN 8 - 23 mg/dL 31(H) 32(H) 27(H)  Creatinine 0.61 - 1.24 mg/dL 1.45(H) 1.57(H) 1.66(H)  Sodium 135 - 145 mmol/L 135 136 133(L)  Potassium 3.5 - 5.1 mmol/L 4.2 3.9 3.9  Chloride 98 - 111 mmol/L 101 102 100  CO2 22 - 32 mmol/L 28 25 25   Calcium 8.9 - 10.3 mg/dL 8.9 8.8(L) 8.6(L)  Total Protein 6.5 - 8.1 g/dL - - -  Total Bilirubin 0.3 - 1.2 mg/dL - - -  Alkaline Phos 38 - 126 U/L - - -  AST 15 - 41 U/L - - -  ALT 0 - 44 U/L  - - -   CBC Latest Ref Rng & Units 12/12/2019 12/11/2019 12/10/2019  WBC 4.0 - 10.5 K/uL 6.8 6.9 11.2(H)  Hemoglobin 13.0 - 17.0 g/dL 10.3(L) 10.1(L) 11.2(L)  Hematocrit 39 - 52 % 32.4(L) 31.5(L) 35.3(L)  Platelets 150 - 400 K/uL 195 180 176   Lipid Panel Recent Labs    12/27/18 0814 01/04/19 0640 04/18/19 1019  CHOL 103 142 127  TRIG 182.0* 148 181*  LDLCALC 34 67 58  VLDL 36.4 30  --   HDL 32.10* 45 39*  CHOLHDL 3 3.2  --     HEMOGLOBIN A1C Lab Results  Component Value Date   HGBA1C 10.8 (H) 12/09/2019   MPG 263.26 12/09/2019   TSH Recent Labs    04/18/19 1019 10/09/19 0559  TSH 1.460 1.422   BNP    Component Value Date/Time   BNP 910.0 (H) 12/09/2019 0243   Medications and allergies   Allergies  Allergen Reactions  . Diltiazem Rash    Current Outpatient Medications on File Prior to Visit  Medication Sig Dispense Refill  . amiodarone (PACERONE) 200 MG tablet Take 0.5 tablets (100 mg total) by  mouth daily. (Patient taking differently: Take 200 mg by mouth daily. )    . aspirin EC 81 MG EC tablet Take 1 tablet (81 mg total) by mouth daily.    . Cholecalciferol (VITAMIN D3) 50 MCG (2000 UT) TABS Take 2,000 Units by mouth 2 (two) times daily.     . Cyanocobalamin 2500 MCG CHEW Chew 2,500 mcg by mouth daily.     . insulin aspart protamine- aspart (NOVOLOG MIX 70/30) (70-30) 100 UNIT/ML injection Inject 0.66 mLs (66 Units total) into the skin daily with breakfast AND 0.6 mLs (60 Units total) daily with supper. (Patient taking differently: Inject 66 units into the skin in the morning with breakfast and 60 units with supper) 10 mL 11  . isosorbide mononitrate (IMDUR) 30 MG 24 hr tablet Take 1 tablet (30 mg total) by mouth daily.    [name] Kitchen liraglutide (VICTOZA) 18 MG/3ML SOPN Inject 1.8 mg into the skin daily.    . metFORMIN (GLUCOPHAGE-XR) 500 MG 24 hr tablet Take 1,000 mg by mouth 2 (two) times daily.    . nitroGLYCERIN (NITROSTAT) 0.4 MG SL tablet Place 1 tablet (0.4 mg  total) under the tongue every 5 (five) minutes x 3 doses as needed for chest pain.  12  . Omega-3 Fatty Acids (FISH OIL) 1000 MG CAPS Take 2,000 mg by mouth 3 (three) times daily.     . OXYGEN Inhale 2 L/min into the lungs See admin instructions. 2 L/min at bedtime in conjunction with CPAP    . potassium chloride SA (KLOR-CON) 20 MEQ tablet TAKE 1 TABLET BY MOUTH TWICE A DAY (Patient taking differently: Take 20 mEq by mouth 2 (two) times daily. ) 60 tablet 1  . PRESCRIPTION MEDICATION Inhale into the lungs See admin instructions. CPAP- At bedtime    . rosuvastatin (CRESTOR) 40 MG tablet Take 1 tablet (40 mg total) by mouth at bedtime.    . sacubitril-valsartan (ENTRESTO) 97-103 MG Take 1 tablet by mouth 2 (two) times daily. 60 tablet   . sertraline (ZOLOFT) 100 MG tablet Take 50 mg by mouth daily.    . ticagrelor (BRILINTA) 90 MG TABS tablet Take 1 tablet (90 mg total) by mouth 2 (two) times daily. 60 tablet   . torsemide (DEMADEX) 20 MG tablet TAKE 1 TABLET BY MOUTH TWICE A DAY (Patient taking differently: Take 20 mg by mouth daily as needed (for an overnight weight gain of 3 pounds or more). ) 60 tablet 0   No current facility-administered medications on file prior to visit.    Radiology:   Chest X-Ray 10/09/2019:  FINDINGS: Unchanged cardiomegaly. Worsened bilateral interstitial opacities. Remote median sternotomy with unchanged position of left chest wall 3 lead AICD. IMPRESSION: Cardiomegaly and worsened bilateral interstitial opacities likely indicating pulmonary edema.  Cardiac Studies:   Renal artery duplex 09/02/2017: Right: Abnormal right Resistive Index. No evidence of right renal  artery stenosis. Left: Normal left Resistive Index. No evidence of left renal artery stenosis. Mesenteric: Normal Celiac artery findings. 70 to 99% stenosis in the superior mesenteric artery.  Left Heart Catheterization12/14/2020:  1. Severe underlying three-vessel coronary artery disease with  patent LIMA to LAD. Chronically occluded SVG to OM. Severe in-stent restenosis in left main stent extending into the proximal left circumflex. In addition, there is significant restenosis in the stent placed in the left posterior AV groove artery. The RCA is known to be small in size and severely diseased. 2. Left ventricular angiography was not performed due to chronic kidney disease.  LVEDP was 13 mmHg.  Coronary angioplasty of the left main and mid circumflex coronary artery 01/11/2019: Successful Wolverine cutting balloon angioplasty of the left main, 3.5 x 10 mm balloon utilized and high-pressure 12 atmospheric pressure inflations performed throughout the in-stent restenotic left main and proximal ostial circumflex and also mid circumflex coronary artery, 99% reduced to 0% with TIMI II to TIMI-3 flow. 60 mill contrast utilized.  Echocardiogram 06/15/2019:  Left ventricle cavity is moderately dilated. Moderate concentric  hypertrophy of the left ventricle. Doppler evidence of grade III  (restrictive) diastolic dysfunction, elevated LAP. Left ventricle regional  wall motion findings: Basal inferolateral akinesis. 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 Mild global hypokinesis. Abnormal septal wall motion due  to post-operative coronary artery bypass graft. Moderately depressed LV  systolic function with visual EF 35-40%.  Left atrial cavity is mildly dilated at 4.3 cm.  Right ventricle cavity is normal in size. Normal right ventricular  function. Pacemaker lead/ICD lead noted in the RV.  Trileaflet aortic valve. Mild (Grade I) aortic regurgitation.  Compared to 01/04/2019, EF improved from 25-30%  Left Heart Catheterization 10/09/2019:  LV: Normal LVEDP.  There was no pressure gradient across the aortic valve.  Angiogram not performed to conserve contrast. Right coronary artery nondominant and severely diffusely diseased unchanged from prior cardiac catheterization. Left main: Mid to  distal left main has in-stent restenosis of 99% and extends into the dominant circumflex which also has proximal in-stent 99% stenosis with TIMI II flow.  Successful scoring balloon angioplasty with 3.5 x 15 mm Wolverine at high 14 atmospheric pressure, due to recall, haziness, stented with 4.0 x 18 mm resolute Onyx DES at 18 atmospheric pressure for 60 seconds, stenosis reduced in the left main and proximal in-stent restenosis from 99% to 0% with improvement in TIMI flow from 2-3. Successful PTCA and Cutting Balloon angioplasty with Wolverine 3.0 x 10 mm balloon in the mid segment of the dominant circumflex coronary artery in-stent restenosis.  Stenosis reduced to 0% at 14 atmospheric pressure inflations x2.  TIMI-3 to TIMI-3 flow maintained. LAD: Is occluded in the proximal segment.  Distal LAD supplied by LIMA.  LIMA to LAD is widely patent.  Left heart catheterization 12/11/2019: LM: Protected. Patent LM/LCx stent LAD: Prox 100% occluded. Bypassed by patent LIMA attaching in mid LAD        Mid to distal LAD 50% disease LCxL: Patent LM/LCx stent. OM3 60% stenosis RCA: Small caliber with severe diffuse disease         Dampening of the pressures noted on engagement  No change in coronary anatomy compared to prior cath in 09/2019 Current presentation likely due to acute on chronic HFrEF Continue guideline directed medical therapy Stop heparin. Resume brilinta with 180 mg loading dose, then 90 mg bid.   Device check: Medtronic CRTD Amplia MRI QWuad CRT-D 10/29/14   Scheduled Remote ICD check 10/22/2019:  There were 0 atrial high rate episodes detected. There were 0 VF episodes, 0 FVT episodes and 2 nonsustained episode of ventricular tachycardia (1 Sec duration - 4 beats). Health trends (patient activity, heart rate variability, average heart rates) are stable. Trans-thoracic impedance trends and the Optivol Fluid Index do no present significant abnormalities. Patient activity <1 hour per day.   Battery longevity is 2.7 years. RA pacing is 36.8 %, RV pacing is 98.5 %, and LV pacing is 98.5 %.  Unscheduled Remote ICD check patient activated 11/22/2019:  Patient activated event, accidental.  Possible fluid overload, impedance back to baseline.  A pace 49%, BiV pace 99%.  Scheduled  In office ICD 11/22/19  Single (S)/Dual (D)/BV (M) BV Presenting APBP Pacer dependant: No. Underlying NSR @ 55/min. AP 48%, BP 98.4% AMS Episodes 1.  SVT 11 S. HVR 0 since prior record 11/22/2019.   Longevity 2.7 Years/Voltage. Charge time 9.8Sec. Lead measurements: Stable Histogram: Low (L)/normal (N)/high (H)  Normal Patient activity Low. Thoracic impedance: Fluid accumulation last one month now back to baseline  Observations: Normal BV ICD.  Changes: Changed AP rate to 50 from 60 for preserving battery. Patient is programmed to [place] pace 100%   EKG:    10/17/2019: AV paced rhythm at rate of 75 bpm. No further analysis.   Assessment     ICD-10-CM   1. Coronary artery disease involving autologous artery coronary bypass graft without angina pectoris  I25.810 carvedilol (COREG) 12.5 MG tablet  2. Ischemic cardiomyopathy  I25.5 carvedilol (COREG) 12.5 MG tablet  3. Chronic combined systolic and diastolic heart failure (HCC)  I50.42 carvedilol (COREG) 12.5 MG tablet  4. Essential hypertension  I10 carvedilol (COREG) 12.5 MG tablet    Meds ordered this encounter  Medications  . carvedilol (COREG) 12.5 MG tablet    Sig: Take 2 tablets (25 mg total) by mouth 2 (two) times daily.    Dispense:  180 tablet    Refill:  3   Medications Discontinued During This Encounter  Medication Reason  . metFORMIN (GLUCOPHAGE) 500 MG tablet Change in therapy  . sertraline (ZOLOFT) 50 MG tablet Change in therapy  . carvedilol (COREG) 12.5 MG tablet    Recommendations:   Jsaon Yoo  is a 64 y.o. Caucasian male with CAD and history of CABG x2 in 2002  with only LIMA to LAD being patent, Circumflex coronary  artery is super dominant. History of stenting to circumflex/OM bifurcation due to recurrent restenosis in 2015, left main and circumflex proximal and midsegment in 2016, angioplasty to left main and proximal and mid circumflex in 2020 and again 10/08/2019.  He also has a history of VT with ICD, ischemic cardiomyopathy, chronic systolic and diastolic heart failure, uncontrolled diabetes mellitus, hypertension, stage III chronic kidney disease, obstructive sleep apnea on CPAP. He was recently admitted to the hospital on 10/08/2019 with acute pulmonary edema and non-ST elevation MI for which she underwent cardiac catheterization with angioplasty to left main and left circumflex coronary arteries.  He presented to the hospital again 12/09/2018 and acute on chronic heart failure repeat angiography revealed patent stents and no change in coronary anatomy.  Patient was successfully diuresed and discharged home.  Patient presents for hospital follow-up, he is presently doing well and continues to tolerate guideline directed medical therapy.  He also continues to tolerate dual antiplatelet therapy without bleeding diathesis.  We will continue aspirin, rosuvastatin, Entresto, Brilinta, torsemide.  Will increase carvedilol from 12.5 mg to 25 mg twice daily.  In the future patient would likely benefit from the addition of spironolactone and/or Jardiance for heart failure management.  Encourage patient to continue with diet and lifestyle modifications as well as weight loss and improve diabetes control.  Patient is high risk for readmission to the hospital due to heart failure, discussed this with patient.  He verbalized understanding and agreement to maintain diet and lifestyle as well as medication compliance.  We will repeat echocardiogram to reevaluate his left ventricular systolic function as his left main artery has now been stented.  There presently no clinical signs of acute heart failure.  Follow-up in  3 weeks as  previously scheduled for heart failure and coronary artery disease.  Will also discuss results of repeat echocardiogram at that time.  Patient was seen in collaboration with Dr. Einar Gip. He also reviewed patient's chart and  Dr. Einar Gip is in agreement of the plan.    Alethia Berthold, PA-C 12/17/2019, 5:08 PM Office: [phone]

## 2019-12-18 ENCOUNTER — Ambulatory Visit: Payer: No Typology Code available for payment source

## 2019-12-18 ENCOUNTER — Other Ambulatory Visit: Payer: No Typology Code available for payment source

## 2019-12-18 DIAGNOSIS — I5042 Chronic combined systolic (congestive) and diastolic (congestive) heart failure: Secondary | ICD-10-CM

## 2019-12-25 ENCOUNTER — Ambulatory Visit: Payer: No Typology Code available for payment source | Admitting: Student

## 2019-12-28 ENCOUNTER — Telehealth: Payer: Self-pay | Admitting: Family Medicine

## 2019-12-28 NOTE — Telephone Encounter (Signed)
The patient had an A1c 2 weeks ago that was 10.8.  It is too early for him to have this rechecked.  His insurance would likely not pay for it.  He should be scheduled for a follow-up visit in the next few weeks if possible.

## 2019-12-28 NOTE — Telephone Encounter (Signed)
Patient called and would like his A1c checked. No orders are in .

## 2019-12-31 NOTE — Telephone Encounter (Signed)
Spoke with patient and advised him on below; patient is okay to schedule appt for follow up. Patient is scheduled for 02/04/19 at 2:45 pm.

## 2020-01-07 DIAGNOSIS — I5043 Acute on chronic combined systolic (congestive) and diastolic (congestive) heart failure: Secondary | ICD-10-CM | POA: Diagnosis not present

## 2020-01-08 ENCOUNTER — Other Ambulatory Visit: Payer: Self-pay

## 2020-01-08 ENCOUNTER — Encounter: Payer: Self-pay | Admitting: Cardiology

## 2020-01-08 ENCOUNTER — Ambulatory Visit: Payer: No Typology Code available for payment source | Admitting: Cardiology

## 2020-01-08 VITALS — BP 124/74 | HR 75 | Ht 67.0 in | Wt 222.0 lb

## 2020-01-08 DIAGNOSIS — R0989 Other specified symptoms and signs involving the circulatory and respiratory systems: Secondary | ICD-10-CM

## 2020-01-08 DIAGNOSIS — Z95810 Presence of automatic (implantable) cardiac defibrillator: Secondary | ICD-10-CM

## 2020-01-08 DIAGNOSIS — I255 Ischemic cardiomyopathy: Secondary | ICD-10-CM

## 2020-01-08 DIAGNOSIS — I5042 Chronic combined systolic (congestive) and diastolic (congestive) heart failure: Secondary | ICD-10-CM

## 2020-01-08 NOTE — Progress Notes (Signed)
Primary Physician/Referring:  [name], MD  Patient ID: Martin Bush, male    DOB: 04/19/1955, 64 y.o.   MRN: [id number]  Chief Complaint  Patient presents with  . Chronic combined systolic and diastolic heart failure  . Follow-up  . Coronary Artery Disease   HPI:    Martin Bush  is a 64 y.o. Caucasian male with CAD and history of CABG x2 in 2002  with only LIMA to LAD being patent, Circumflex coronary artery is super dominant. History of stenting to circumflex/OM bifurcation due to recurrent restenosis in 2015, left main and circumflex proximal and midsegment in 2016, angioplasty to left main and proximal and mid circumflex in 2020 and again 10/08/2019.  He also has a history of VT with ICD, ischemic cardiomyopathy, chronic systolic and diastolic heart failure.  He was admitted to the hospital on 10/08/2019, admitted with acute pulmonary edema and non-ST elevation MI, underwent angioplasty to left main and left circumflex coronary arteries. He presented with acute pulmonary edema again on 12/11/2019 and repeat cardiac catheterization essentially revealed widely patent circumflex stent.  He is presently doing well, except for baseline dyspnea no PND or orthopnea, he has not noticed any worsening leg edema. He has not used any sublingual nitroglycerin. States that he has made lifestyle changes.   Past Medical History:  Diagnosis Date  . AICD (automatic cardioverter/defibrillator) present    Medtronic  . Arthritis    "thumbs"  (08/02/2017)  . CHF (congestive heart failure) ([place])   . Chronic combined systolic and diastolic heart failure ([place])    a. 08/2017 Echo: EF 20-25%, mod glob HK. Sev distal ant sept, inflat HK. Apical AK. Gr2 DD. Mildly reduced RV fxn.  . Colon polyps   . Coronary artery disease    a. s/p CABG x 2 (LIMA->LAD, VG->OM); b. Multiple PCI's to LM/LCX/OM; c. 07/2017 PTCA of LM/LCX w/ early ISR-->repeat PCI/DES to LM (3.5x20 Synergy DES) and LCX (3.0x20  Synergy DES); d. 07/2018 Relook Cath: LM patent stent, LAD 100ost, LCX patent stent, OM1 99/60, LIMA->LAD ok. VG->dLCX 64 (old).  . Depression   . Encounter for assessment of implantable cardioverter-defibrillator (ICD) 09/27/2018  . High cholesterol   . Hypertension   . ICD; Biventricular  Medtronic ICD Amplia MRI QWuad CRT-D  in situ 10/29/14 10/29/2014   Remote ICD check 09.23.20:  One 6 beat NSVT. No therapy.  1 SVT episode @ 130 bpm (38 Sec).  There were 23 Vent sense episodes detected for up to 1.1 min/day (AT). Health trends (patient activity, heart rate variability, average heart rates) are stable.Trans-thoracic impedance trends and the OptiVol Fluid Index do no present significant abnormalities. Battery longevity is 4.3 years. RA pacing is 3  . Ischemic cardiomyopathy 09/27/2018  . MI (myocardial infarction) ([place]) 2003  . NSTEMI (non-ST elevated myocardial infarction) ([place]) 07/16/2014  . OSA on CPAP   . Oxygen deficiency   . Pneumonia 10/2014  . Proteinuria   . Sleep apnea   . Type II diabetes mellitus (HCC)    insulin dependent   Past Surgical History:  Procedure Laterality Date  . BIOPSY  09/19/2018   Procedure: BIOPSY;  Surgeon: Thornton Park, MD;  Location: [facility];  Service: Gastroenterology;;  . CARDIAC CATHETERIZATION N/A 07/16/2014   Procedure: Left Heart Cath and Coronary Angiography;  Surgeon: Adrian Prows, MD;  Location: [place] [facility];  Service: Cardiovascular;  Laterality: N/A;  . CARDIAC CATHETERIZATION  07/16/2014   Procedure: Coronary Balloon Angioplasty;  Surgeon:  Adrian Prows, MD;  Location: [place] [facility];  Service: Cardiovascular;;  . CARDIAC CATHETERIZATION  2003  . CARDIAC DEFIBRILLATOR PLACEMENT  2016  . CATARACT EXTRACTION W/ INTRAOCULAR LENS IMPLANT Left 03/2014  . COLONOSCOPY    . COLONOSCOPY WITH PROPOFOL N/A 09/19/2018   Procedure: COLONOSCOPY WITH PROPOFOL;  Surgeon: Thornton Park, MD;  Location: [facility];  Service: Gastroenterology;   Laterality: N/A;  . CORONARY ANGIOPLASTY WITH STENT PLACEMENT     "I've got a total of 8 stents in there; mostly doine at [facility]" (08/02/2017)  . CORONARY ARTERY BYPASS GRAFT  ~ 2003   "CABG X2"; [facility]  . CORONARY ATHERECTOMY N/A 08/16/2017   Procedure: CORONARY ATHERECTOMY;  Surgeon: Nigel Mormon, MD;  Location: [place] [facility];  Service: Cardiovascular;  Laterality: N/A;  . CORONARY BALLOON ANGIOPLASTY N/A 08/04/2017   Procedure: CORONARY BALLOON ANGIOPLASTY;  Surgeon: Adrian Prows, MD;  Location: [place] [facility];  Service: Cardiovascular;  Laterality: N/A;  . CORONARY BALLOON ANGIOPLASTY N/A 01/11/2019   Procedure: CORONARY BALLOON ANGIOPLASTY;  Surgeon: Adrian Prows, MD;  Location: [place] [facility];  Service: Cardiovascular;  Laterality: N/A;  . CORONARY BALLOON ANGIOPLASTY N/A 10/09/2019   Procedure: CORONARY BALLOON ANGIOPLASTY;  Surgeon: Adrian Prows, MD;  Location: [place] [facility];  Service: Cardiovascular;  Laterality: N/A;  . CORONARY STENT INTERVENTION N/A 08/16/2017   Procedure: CORONARY STENT INTERVENTION;  Surgeon: Nigel Mormon, MD;  Location: [place] [facility];  Service: Cardiovascular;  Laterality: N/A;  . CORONARY STENT INTERVENTION N/A 10/09/2019   Procedure: CORONARY STENT INTERVENTION;  Surgeon: Adrian Prows, MD;  Location: [place] [facility];  Service: Cardiovascular;  Laterality: N/A;  . ELBOW SURGERY Left ?2001   "pinched nerve"  . ENDOSCOPIC MUCOSAL RESECTION  12/18/2018   Procedure: ENDOSCOPIC MUCOSAL RESECTION;  Surgeon: Rush Landmark Telford Nab., MD;  Location: [place];  Service: Gastroenterology;;  . Otho Darner SIGMOIDOSCOPY N/A 12/18/2018   Procedure: Beryle Quant;  Surgeon: Irving Copas., MD;  Location: [place];  Service: Gastroenterology;  Laterality: N/A;  . HEMOSTASIS CLIP PLACEMENT  12/18/2018   Procedure: HEMOSTASIS CLIP PLACEMENT;  Surgeon: Irving Copas., MD;  Location: [place];  Service:  Gastroenterology;;  . LEFT HEART CATH AND CORONARY ANGIOGRAPHY N/A 01/11/2019   Procedure: LEFT HEART CATH AND CORONARY ANGIOGRAPHY;  Surgeon: Adrian Prows, MD;  Location: [place] [facility];  Service: Cardiovascular;  Laterality: N/A;  . LEFT HEART CATH AND CORS/GRAFTS ANGIOGRAPHY N/A 08/04/2017   Procedure: LEFT HEART CATH AND CORS/GRAFTS ANGIOGRAPHY;  Surgeon: Adrian Prows, MD;  Location: [place] [facility];  Service: Cardiovascular;  Laterality: N/A;  . LEFT HEART CATH AND CORS/GRAFTS ANGIOGRAPHY N/A 08/20/2017   Procedure: LEFT HEART CATH AND CORS/GRAFTS ANGIOGRAPHY;  Surgeon: Nigel Mormon, MD;  Location: [place] [facility];  Service: Cardiovascular;  Laterality: N/A;  . LEFT HEART CATH AND CORS/GRAFTS ANGIOGRAPHY N/A 01/08/2019   Procedure: LEFT HEART CATH AND CORS/GRAFTS ANGIOGRAPHY;  Surgeon: Wellington Hampshire, MD;  Location: [place] [facility];  Service: Cardiovascular;  Laterality: N/A;  . LEFT HEART CATH AND CORS/GRAFTS ANGIOGRAPHY N/A 10/09/2019   Procedure: LEFT HEART CATH AND CORS/GRAFTS ANGIOGRAPHY;  Surgeon: Adrian Prows, MD;  Location: [place] [facility];  Service: Cardiovascular;  Laterality: N/A;  . LEFT HEART CATH AND CORS/GRAFTS ANGIOGRAPHY N/A 12/11/2019   Procedure: LEFT HEART CATH AND CORS/GRAFTS ANGIOGRAPHY;  Surgeon: Nigel Mormon, MD;  Location: [place] [facility];  Service: Cardiovascular;  Laterality: N/A;  .  POLYPECTOMY  09/19/2018   Procedure: POLYPECTOMY;  Surgeon: Thornton Park, MD;  Location: [facility];  Service: Gastroenterology;;  . Lia Foyer INJECTION  09/19/2018   Procedure: SUBMUCOSAL INJECTION;  Surgeon: Thornton Park, MD;  Location[facility];  Service: Gastroenterology;;  . Lia Foyer LIFTING INJECTION  12/18/2018   Procedure: SUBMUCOSAL LIFTING INJECTION;  Surgeon: Irving Copas., MD;  Location: [facility];  Service: Gastroenterology;;    Family History  Problem Relation Age of Onset  . Uterine cancer Mother   . Lung  cancer Mother   . Hyperlipidemia Father   . Heart disease Father   . Hypertension Father   . Diabetes Father   . Colon cancer Neg Hx    Social History   Tobacco Use  . Smoking status: Former Smoker    Packs/day: 0.25    Years: 27.00    Pack years: 6.75    Types: Cigars    Quit date: 2002    Years since quitting: 19.9  . Smokeless tobacco: Former Systems developer    Quit date: 2002  Substance Use Topics  . Alcohol use: Not Currently    Marital Status: Married ROS  Review of Systems  Cardiovascular: Positive for dyspnea on exertion (improved). Negative for chest pain, claudication, leg swelling, orthopnea, palpitations, paroxysmal nocturnal dyspnea and syncope.  Respiratory: Negative for shortness of breath.   Hematologic/Lymphatic: Does not bruise/bleed easily.  Gastrointestinal: Negative for melena.  Neurological: Negative for dizziness.   Objective  Blood pressure 124/74, pulse 75, height 5\' 7"  (1.702 m), weight 222 lb (100.7 kg), SpO2 98 %.  Vitals with BMI 01/08/2020 12/17/2019 12/12/2019  Height 5\' 7"  5\' 7"  -  Weight 222 lbs 214 lbs -  BMI 62.70 35.00 -  Systolic 938 182 -  Diastolic 74 74 -  Pulse 75 73 97     Physical Exam Vitals reviewed.  Constitutional:      Comments: He is moderately built and mildly obese in no acute distress.  Cardiovascular:     Rate and Rhythm: Normal rate and regular rhythm.     Pulses:          Carotid pulses are 1+ on the right side with bruit and 1+ on the left side with bruit.      Femoral pulses are 1+ on the right side and 1+ on the left side.      Popliteal pulses are 1+ on the right side and 1+ on the left side.       Dorsalis pedis pulses are 0 on the right side and 1+ on the left side.       Posterior tibial pulses are 1+ on the right side and 0 on the left side.     Heart sounds: Normal heart sounds.     Comments: No JVD.  1+ pitting edema below knee bilateral  Pulmonary:     Effort: Pulmonary effort is normal. No accessory  muscle usage or respiratory distress.     Breath sounds: Normal breath sounds.    Laboratory examination:   Recent Labs    10/23/19 1347 11/14/19 1514 12/06/19 1431 12/09/19 0252 12/10/19 0218 12/11/19 0414 12/12/19 0337  NA 136 138 141   < > 133* 136 135  K 4.6 4.8 5.1   < > 3.9 3.9 4.2  CL 100 102 103   < > 100 102 101  CO2 20 21 24    < > 25 25 28   GLUCOSE 356* 362* 377*   < > 283* 102* 106*  BUN 28* 24 17   < > 27* 32* 31*  CREATININE 1.32* 1.43* 1.30*   < > 1.66* 1.57* 1.45*  CALCIUM 9.4 9.3 8.9   < > 8.6* 8.8* 8.9  GFRNONAA 57* 52* 58*   < > 46* 49* 54*  GFRAA 66 60 67  --   --   --   --    < > = values in this interval not displayed.   CrCl cannot be calculated (Patient's most recent lab result is older than the maximum 21 days allowed.).  CMP Latest Ref Rng & Units 12/12/2019 12/11/2019 12/10/2019  Glucose 70 - 99 mg/dL 106(H) 102(H) 283(H)  BUN 8 - 23 mg/dL 31(H) 32(H) 27(H)  Creatinine 0.61 - 1.24 mg/dL 1.45(H) 1.57(H) 1.66(H)  Sodium 135 - 145 mmol/L 135 136 133(L)  Potassium 3.5 - 5.1 mmol/L 4.2 3.9 3.9  Chloride 98 - 111 mmol/L 101 102 100  CO2 22 - 32 mmol/L 28 25 25   Calcium 8.9 - 10.3 mg/dL 8.9 8.8(L) 8.6(L)  Total Protein 6.5 - 8.1 g/dL - - -  Total Bilirubin 0.3 - 1.2 mg/dL - - -  Alkaline Phos 38 - 126 U/L - - -  AST 15 - 41 U/L - - -  ALT 0 - 44 U/L - - -   CBC Latest Ref Rng & Units 12/12/2019 12/11/2019 12/10/2019  WBC 4.0 - 10.5 K/uL 6.8 6.9 11.2(H)  Hemoglobin 13.0 - 17.0 g/dL 10.3(L) 10.1(L) 11.2(L)  Hematocrit 39.0 - 52.0 % 32.4(L) 31.5(L) 35.3(L)  Platelets 150 - 400 K/uL 195 180 176   Lipid Panel Recent Labs    04/18/19 1019  CHOL 127  TRIG 181*  LDLCALC 58  HDL 39*    HEMOGLOBIN A1C Lab Results  Component Value Date   HGBA1C 10.8 (H) 12/09/2019   MPG 263.26 12/09/2019   TSH Recent Labs    04/18/19 1019 10/09/19 0559  TSH 1.460 1.422   BNP    Component Value Date/Time   BNP 910.0 (H) 12/09/2019 0243   Medications  and allergies   Allergies  Allergen Reactions  . Diltiazem Rash    Current Outpatient Medications on File Prior to Visit  Medication Sig Dispense Refill  . amiodarone (PACERONE) 200 MG tablet Take 0.5 tablets (100 mg total) by mouth daily. (Patient taking differently: Take 200 mg by mouth daily.)    . aspirin EC 81 MG EC tablet Take 1 tablet (81 mg total) by mouth daily.    . carvedilol (COREG) 12.5 MG tablet Take 2 tablets (25 mg total) by mouth 2 (two) times daily. 180 tablet 3  . Cholecalciferol (VITAMIN D3) 50 MCG (2000 UT) TABS Take 2,000 Units by mouth 2 (two) times daily.     . Cyanocobalamin 2500 MCG CHEW Chew 2,500 mcg by mouth daily.     . insulin aspart protamine- aspart (NOVOLOG MIX 70/30) (70-30) 100 UNIT/ML injection Inject 0.66 mLs (66 Units total) into the skin daily with breakfast AND 0.6 mLs (60 Units total) daily with supper. (Patient taking differently: Inject 66 units into the skin in the morning with breakfast and 60 units with supper) 10 mL 11  . isosorbide mononitrate (IMDUR) 30 MG 24 hr tablet Take 1 tablet (30 mg total) by mouth daily.    [name] Kitchen liraglutide (VICTOZA) 18 MG/3ML SOPN Inject 1.8 mg into the skin daily.    . metFORMIN (GLUCOPHAGE-XR) 500 MG 24 hr tablet Take 1,000 mg by mouth 2 (two) times daily.    [name] Kitchen  nitroGLYCERIN (NITROSTAT) 0.4 MG SL tablet Place 1 tablet (0.4 mg total) under the tongue every 5 (five) minutes x 3 doses as needed for chest pain.  12  . Omega-3 Fatty Acids (FISH OIL) 1000 MG CAPS Take 2,000 mg by mouth 3 (three) times daily.     . OXYGEN Inhale 2 L/min into the lungs See admin instructions. 2 L/min at bedtime in conjunction with CPAP    . potassium chloride SA (KLOR-CON) 20 MEQ tablet TAKE 1 TABLET BY MOUTH TWICE A DAY (Patient taking differently: Take 20 mEq by mouth 2 (two) times daily.) 60 tablet 1  . PRESCRIPTION MEDICATION Inhale into the lungs See admin instructions. CPAP- At bedtime    . rosuvastatin (CRESTOR) 40 MG tablet Take 1 tablet  (40 mg total) by mouth at bedtime.    . sacubitril-valsartan (ENTRESTO) 97-103 MG Take 1 tablet by mouth 2 (two) times daily. 60 tablet   . sertraline (ZOLOFT) 100 MG tablet Take 50 mg by mouth daily.    . ticagrelor (BRILINTA) 90 MG TABS tablet Take 1 tablet (90 mg total) by mouth 2 (two) times daily. 60 tablet   . torsemide (DEMADEX) 20 MG tablet TAKE 1 TABLET BY MOUTH TWICE A DAY (Patient taking differently: Take 20 mg by mouth daily as needed (for an overnight weight gain of 3 pounds or more).) 60 tablet 0   No current facility-administered medications on file prior to visit.    Radiology:   Chest X-Ray 10/09/2019:  FINDINGS: Unchanged cardiomegaly. Worsened bilateral interstitial opacities. Remote median sternotomy with unchanged position of left chest wall 3 lead AICD. IMPRESSION: Cardiomegaly and worsened bilateral interstitial opacities likely indicating pulmonary edema.  Cardiac Studies:   Renal artery duplex 09/02/2017: Right: Abnormal right Resistive Index. No evidence of right renal  artery stenosis. Left: Normal left Resistive Index. No evidence of left renal artery stenosis. Mesenteric: Normal Celiac artery findings. 70 to 99% stenosis in the superior mesenteric artery.  Left Heart Catheterization12/14/2020:  1. Severe underlying three-vessel coronary artery disease with patent LIMA to LAD. Chronically occluded SVG to OM. Severe in-stent restenosis in left main stent extending into the proximal left circumflex. In addition, there is significant restenosis in the stent placed in the left posterior AV groove artery. The RCA is known to be small in size and severely diseased. 2. Left ventricular angiography was not performed due to chronic kidney disease. LVEDP was 13 mmHg.  Coronary angioplasty of the left main and mid circumflex coronary artery 01/11/2019: Successful Wolverine cutting balloon angioplasty of the left main, 3.5 x 10 mm balloon utilized and high-pressure  12 atmospheric pressure inflations performed throughout the in-stent restenotic left main and proximal ostial circumflex and also mid circumflex coronary artery, 99% reduced to 0% with TIMI II to TIMI-3 flow. 60 mill contrast utilized.  Echocardiogram 12/18/2019:  Poor eco window and wall motion with reduced sensitivity.  Left ventricle cavity is mildly dilated. Dilated cardiomyopathy. Hypokinetic global wall motion. Abnormal septal wall motion due to post-operative coronary artery bypass graft. Indeterminate diastolic filling pattern due to paced rhythm. Moderately depressed LV systolic function with visual EF 30-35%.  Calculated EF 37%. Left atrial cavity is mildly dilated at 4.2 cm. Right ventricle cavity is normal in size. Normal right ventricular function. RV Pacemaker wires visualized. No significant change since 06/15/2019. EF previously 35-40%.   Left Heart Catheterization 10/09/2019:  LV: Normal LVEDP.  There was no pressure gradient across the aortic valve.  Angiogram not performed to conserve contrast. Right  coronary artery nondominant and severely diffusely diseased unchanged from prior cardiac catheterization. Left main: Mid to distal left main has in-stent restenosis of 99% and extends into the dominant circumflex which also has proximal in-stent 99% stenosis with TIMI II flow.  Successful scoring balloon angioplasty with 3.5 x 15 mm Wolverine at high 14 atmospheric pressure, due to recall, haziness, stented with 4.0 x 18 mm resolute Onyx DES at 18 atmospheric pressure for 60 seconds, stenosis reduced in the left main and proximal in-stent restenosis from 99% to 0% with improvement in TIMI flow from 2-3. Successful PTCA and Cutting Balloon angioplasty with Wolverine 3.0 x 10 mm balloon in the mid segment of the dominant circumflex coronary artery in-stent restenosis.  Stenosis reduced to 0% at 14 atmospheric pressure inflations x2.  TIMI-3 to TIMI-3 flow maintained. LAD: Is occluded in  the proximal segment.  Distal LAD supplied by LIMA.  LIMA to LAD is widely patent.  Left Heart Catheterization 12/11/2019:   LM: Protected. Patent LM/LCx stent LAD: Prox 100% occluded. Bypassed by patent LIMA attaching in mid LAD        Mid to distal LAD 50% disease LCxL: Patent LM/LCx stent. OM3 60% stenosis RCA: Small caliber with severe diffuse disease         Dampening of the pressures noted on engagement  No change in coronary anatomy compared to prior cath in 09/2019 Current presentation likely due to acute on chronic HFrEF  Device check: Medtronic CRTD Amplia MRI QWuad CRT-D 10/29/14   Scheduled Remote ICD check 12/23/2019:  There were 0 atrial high rate episodes detected. There were 0 VF episodes, 0 VT episodes and 0 nonsustained episode of ventricular tachycardia. Health trends (patient activity, heart rate variability, average heart rates) are stable. Trans-thoracic impedance trends and the Optivol Fluid Index do no present significant abnormalities. Patient activity 1-2 hour per day.  Battery longevity is 2.6 years. RA pacing is 10.9 %, RV pacing is 95.8 %, and LV pacing is 99.7 %.  Unscheduled Remote ICD check patient activated 11/22/2019:  Patient activated event, accidental.  Possible fluid overload, impedance back to baseline.  A pace 49%, BiV pace 99%.  Scheduled  In office ICD 11/22/19  Single (S)/Dual (D)/BV (M) BV Presenting APBP Pacer dependant: No. Underlying NSR @ 55/min. AP 48%, BP 98.4% AMS Episodes 1.  SVT 11 S. HVR 0 since prior record 11/22/2019.   Longevity 2.7 Years/Voltage. Charge time 9.8Sec. Lead measurements: Stable Histogram: Low (L)/normal (N)/high (H)  Normal Patient activity Low. Thoracic impedance: Fluid accumulation last one month now back to baseline  Observations: Normal BV ICD.  Changes: Changed AP rate to 50 from 60 for preserving battery. Patient is programmed to [place] pace 100%   EKG:      10/17/2019: AV paced rhythm at rate of 75 bpm. No  further analysis.   Assessment     ICD-10-CM   1. Ischemic cardiomyopathy  I25.5   2. Chronic combined systolic and diastolic heart failure (HCC)  I50.42   3. ICD; Biventricular  Medtronic ICD Amplia MRI QWuad CRT-D  in situ 10/29/14  Z95.810   4. Bilateral carotid bruits  R09.89 PCV CAROTID DUPLEX (BILATERAL)    No orders of the defined types were placed in this encounter.  There are no discontinued medications. Recommendations:   Martin Bush  is a 64 y.o. Caucasian male with CAD and history of CABG x2 in 2002  with only LIMA to LAD being patent, Circumflex coronary artery is super dominant. History  of stenting to circumflex/OM bifurcation due to recurrent restenosis in 2015, left main and circumflex proximal and midsegment in 2016, angioplasty to left main and proximal and mid circumflex in 2020 and again 10/08/2019.  He also has a history of VT with ICD, ischemic cardiomyopathy, chronic systolic and diastolic heart failure, uncontrolled diabetes mellitus, hypertension, stage III chronic kidney disease, obstructive sleep apnea on CPAP. On 10/08/2019, admitted with acute pulmonary edema and non-ST elevation MI, underwent angioplasty to left main and left circumflex coronary arteries. He presented with acute pulmonary edema again on 12/11/2019 and repeat cardiac catheterization essentially revealed widely patent circumflex stent.  Fortunately he is doing well, again weight is slowly trending up, but patient states that the used to eat out twice a day now they are doing it once a week and he is presently feeling well without marked worsening dyspnea, no further leg edema, no PND or orthopnea or chest pain.  Blood pressures well controlled, lipids are at goal. I hear prominent bilateral carotid bruit that was also heard previously, he has not had carotid duplex in a while, will obtain carotid duplex.  I reviewed the results of the recently transmitted ICD remote report. Reassuring that he does not  have any fluid overload state. I would like to see him back in 3 months for close monitoring. He is enrolled in RPM and PCM for remote monitoring and will continue in the program.    Adrian Prows, PA-C 01/09/2020, 6:13 AM Office: [phone]

## 2020-01-09 ENCOUNTER — Encounter: Payer: Self-pay | Admitting: Cardiology

## 2020-02-04 ENCOUNTER — Telehealth: Payer: Self-pay | Admitting: Cardiology

## 2020-02-04 ENCOUNTER — Other Ambulatory Visit: Payer: Self-pay

## 2020-02-04 ENCOUNTER — Encounter: Payer: Self-pay | Admitting: Family Medicine

## 2020-02-04 ENCOUNTER — Telehealth (INDEPENDENT_AMBULATORY_CARE_PROVIDER_SITE_OTHER): Payer: No Typology Code available for payment source | Admitting: Family Medicine

## 2020-02-04 DIAGNOSIS — I25810 Atherosclerosis of coronary artery bypass graft(s) without angina pectoris: Secondary | ICD-10-CM

## 2020-02-04 DIAGNOSIS — I5042 Chronic combined systolic (congestive) and diastolic (congestive) heart failure: Secondary | ICD-10-CM

## 2020-02-04 DIAGNOSIS — E785 Hyperlipidemia, unspecified: Secondary | ICD-10-CM

## 2020-02-04 DIAGNOSIS — I10 Essential (primary) hypertension: Secondary | ICD-10-CM | POA: Diagnosis not present

## 2020-02-04 DIAGNOSIS — I5022 Chronic systolic (congestive) heart failure: Secondary | ICD-10-CM | POA: Diagnosis not present

## 2020-02-04 DIAGNOSIS — Z794 Long term (current) use of insulin: Secondary | ICD-10-CM

## 2020-02-04 DIAGNOSIS — Z4502 Encounter for adjustment and management of automatic implantable cardiac defibrillator: Secondary | ICD-10-CM

## 2020-02-04 DIAGNOSIS — E119 Type 2 diabetes mellitus without complications: Secondary | ICD-10-CM

## 2020-02-04 DIAGNOSIS — Z95810 Presence of automatic (implantable) cardiac defibrillator: Secondary | ICD-10-CM

## 2020-02-04 NOTE — Telephone Encounter (Signed)
Scheduled  In office ICD @TODAY @  Single (S)/Dual (D)/BV (M) BV Presenting ASBP  AP 19%. BP 97.% AMS Episodes 0.  HVR 0. Longest  Longevity 2.4 Years/Voltage.  Lead measurements: Stable Histogram: Low (L)/normal (N)/high (H)  Normal Patient activity Low. Thoracic impedance: No fluids accumulation. Impedance at baseline.  Observations: Normal device function.  Changes: None  ICD is functioning normally.  The beeps that he is getting in the transmitter at home may be just an error, advised the patient to disconnect and reconnect the transmitter at home.  No changes were made with ICD today.    ICD-10-CM   1. Encounter for assessment of implantable cardioverter-defibrillator (ICD)  Z45.02   2. ICD; Biventricular  Medtronic ICD Amplia MRI QWuad CRT-D  in situ 10/29/14  Z95.810   3. Chronic combined systolic and diastolic heart failure (HCC)  I50.42       Adrian Prows, MD, [facility] 02/04/2020, 1:14 PM Office: [phone] Pager: [phone]

## 2020-02-04 NOTE — Progress Notes (Signed)
Virtual Visit via telephone Note  This visit type was conducted due to national recommendations for restrictions regarding the COVID-19 pandemic (e.g. social distancing).  This format is felt to be most appropriate for this patient at this time.  All issues noted in this document were discussed and addressed.  No physical exam was performed (except for noted visual exam findings with Video Visits).   I connected with Martin Bush today at  2:45 PM EST by telephone and verified that I am speaking with the correct person using two identifiers. Location patient: truck Location provider: work  Persons participating in the virtual visit: patient, provider  I discussed the limitations, risks, security and privacy concerns of performing an evaluation and management service by telephone and the availability of in person appointments. I also discussed with the patient that there may be a patient responsible charge related to this service. The patient expressed understanding and agreed to proceed.  Interactive audio and video telecommunications were attempted between this provider and patient, however failed, due to patient having technical difficulties OR patient did not have access to video capability.  We continued and completed visit with audio only.   Reason for visit: f/u  HPI: DIABETES Disease Monitoring: Blood Sugar ranges-100-189, typically 140-150 Polyuria/phagia/dipsia- no      Optho- see's next week Medications: Compliance- taking insulin 70/30 66 u am and 60 u pm Hypoglycemic symptoms- no  HYPERTENSION/CAD, CHF Disease Monitoring  Chest pain- no    Dyspnea- no     Orthopnea- no       PND- no Medications  Compliance-  Taking imdur, entresto, brilinta, torsemide, coreg.   Edema- no  HYPERLIPIDEMIA Symptoms Chest pain on exertion:  no   Medications: Compliance- taking crestor Right upper quadrant pain- no  Muscle aches- no  Reports he had his covid booster about a month ago.     ROS: See pertinent positives and negatives per HPI.  Past Medical History:  Diagnosis Date  . AICD (automatic cardioverter/defibrillator) present    Medtronic  . Arthritis    "thumbs"  (08/02/2017)  . CHF (congestive heart failure) ([place])   . Chronic combined systolic and diastolic heart failure ([place])    a. 08/2017 Echo: EF 20-25%, mod glob HK. Sev distal ant sept, inflat HK. Apical AK. Gr2 DD. Mildly reduced RV fxn.  . Colon polyps   . Coronary artery disease    a. s/p CABG x 2 (LIMA->LAD, VG->OM); b. Multiple PCI's to LM/LCX/OM; c. 07/2017 PTCA of LM/LCX w/ early ISR-->repeat PCI/DES to LM (3.5x20 Synergy DES) and LCX (3.0x20 Synergy DES); d. 07/2018 Relook Cath: LM patent stent, LAD 100ost, LCX patent stent, OM1 99/60, LIMA->LAD ok. VG->dLCX 67 (old).  . Depression   . Encounter for assessment of implantable cardioverter-defibrillator (ICD) 09/27/2018  . High cholesterol   . Hypertension   . ICD; Biventricular  Medtronic ICD Amplia MRI QWuad CRT-D  in situ 10/29/14 10/29/2014   Remote ICD check 09.23.20:  One 6 beat NSVT. No therapy.  1 SVT episode @ 130 bpm (38 Sec).  There were 23 Vent sense episodes detected for up to 1.1 min/day (AT). Health trends (patient activity, heart rate variability, average heart rates) are stable.Trans-thoracic impedance trends and the OptiVol Fluid Index do no present significant abnormalities. Battery longevity is 4.3 years. RA pacing is 3  . Ischemic cardiomyopathy 09/27/2018  . MI (myocardial infarction) ([place]) 2003  . NSTEMI (non-ST elevated myocardial infarction) ([place]) 07/16/2014  . OSA on CPAP   .  Oxygen deficiency   . Pneumonia 10/2014  . Proteinuria   . Sleep apnea   . Type II diabetes mellitus (HCC)    insulin dependent    Past Surgical History:  Procedure Laterality Date  . BIOPSY  09/19/2018   Procedure: BIOPSY;  Surgeon: Thornton Park, MD;  Location: [facility];  Service: Gastroenterology;;  . CARDIAC CATHETERIZATION N/A 07/16/2014    Procedure: Left Heart Cath and Coronary Angiography;  Surgeon: Adrian Prows, MD;  Location: [place] [facility];  Service: Cardiovascular;  Laterality: N/A;  . CARDIAC CATHETERIZATION  07/16/2014   Procedure: Coronary Balloon Angioplasty;  Surgeon: Adrian Prows, MD;  Location: [place] [facility];  Service: Cardiovascular;;  . CARDIAC CATHETERIZATION  2003  . CARDIAC DEFIBRILLATOR PLACEMENT  2016  . CATARACT EXTRACTION W/ INTRAOCULAR LENS IMPLANT Left 03/2014  . COLONOSCOPY    . COLONOSCOPY WITH PROPOFOL N/A 09/19/2018   Procedure: COLONOSCOPY WITH PROPOFOL;  Surgeon: Thornton Park, MD;  Location: [facility];  Service: Gastroenterology;  Laterality: N/A;  . CORONARY ANGIOPLASTY WITH STENT PLACEMENT     "I've got a total of 8 stents in there; mostly doine at [facility]" (08/02/2017)  . CORONARY ARTERY BYPASS GRAFT  ~ 2003   "CABG X2"; [facility]  . CORONARY ATHERECTOMY N/A 08/16/2017   Procedure: CORONARY ATHERECTOMY;  Surgeon: Nigel Mormon, MD;  Location: [place] [facility];  Service: Cardiovascular;  Laterality: N/A;  . CORONARY BALLOON ANGIOPLASTY N/A 08/04/2017   Procedure: CORONARY BALLOON ANGIOPLASTY;  Surgeon: Adrian Prows, MD;  Location: [place] [facility];  Service: Cardiovascular;  Laterality: N/A;  . CORONARY BALLOON ANGIOPLASTY N/A 01/11/2019   Procedure: CORONARY BALLOON ANGIOPLASTY;  Surgeon: Adrian Prows, MD;  Location: [place] [facility];  Service: Cardiovascular;  Laterality: N/A;  . CORONARY BALLOON ANGIOPLASTY N/A 10/09/2019   Procedure: CORONARY BALLOON ANGIOPLASTY;  Surgeon: Adrian Prows, MD;  Location: [place] [facility];  Service: Cardiovascular;  Laterality: N/A;  . CORONARY STENT INTERVENTION N/A 08/16/2017   Procedure: CORONARY STENT INTERVENTION;  Surgeon: Nigel Mormon, MD;  Location: [place] [facility];  Service: Cardiovascular;  Laterality: N/A;  . CORONARY STENT INTERVENTION N/A 10/09/2019   Procedure: CORONARY STENT INTERVENTION;  Surgeon: Adrian Prows, MD;   Location: [place] [facility];  Service: Cardiovascular;  Laterality: N/A;  . ELBOW SURGERY Left ?2001   "pinched nerve"  . ENDOSCOPIC MUCOSAL RESECTION  12/18/2018   Procedure: ENDOSCOPIC MUCOSAL RESECTION;  Surgeon: Rush Landmark Telford Nab., MD;  Location: [place];  Service: Gastroenterology;;  . Otho Darner SIGMOIDOSCOPY N/A 12/18/2018   Procedure: Beryle Quant;  Surgeon: Irving Copas., MD;  Location: [place];  Service: Gastroenterology;  Laterality: N/A;  . HEMOSTASIS CLIP PLACEMENT  12/18/2018   Procedure: HEMOSTASIS CLIP PLACEMENT;  Surgeon: Irving Copas., MD;  Location: [place];  Service: Gastroenterology;;  . LEFT HEART CATH AND CORONARY ANGIOGRAPHY N/A 01/11/2019   Procedure: LEFT HEART CATH AND CORONARY ANGIOGRAPHY;  Surgeon: Adrian Prows, MD;  Location: [place] [facility];  Service: Cardiovascular;  Laterality: N/A;  . LEFT HEART CATH AND CORS/GRAFTS ANGIOGRAPHY N/A 08/04/2017   Procedure: LEFT HEART CATH AND CORS/GRAFTS ANGIOGRAPHY;  Surgeon: Adrian Prows, MD;  Location: [place] [facility];  Service: Cardiovascular;  Laterality: N/A;  . LEFT HEART CATH AND CORS/GRAFTS ANGIOGRAPHY N/A 08/20/2017   Procedure: LEFT HEART CATH AND CORS/GRAFTS ANGIOGRAPHY;  Surgeon: Nigel Mormon, MD;  Location: [place] [facility];  Service: Cardiovascular;  Laterality: N/A;  . LEFT HEART CATH AND  CORS/GRAFTS ANGIOGRAPHY N/A 01/08/2019   Procedure: LEFT HEART CATH AND CORS/GRAFTS ANGIOGRAPHY;  Surgeon: Wellington Hampshire, MD;  Location: [place] [facility];  Service: Cardiovascular;  Laterality: N/A;  . LEFT HEART CATH AND CORS/GRAFTS ANGIOGRAPHY N/A 10/09/2019   Procedure: LEFT HEART CATH AND CORS/GRAFTS ANGIOGRAPHY;  Surgeon: Adrian Prows, MD;  Location: [place] [facility];  Service: Cardiovascular;  Laterality: N/A;  . LEFT HEART CATH AND CORS/GRAFTS ANGIOGRAPHY N/A 12/11/2019   Procedure: LEFT HEART CATH AND CORS/GRAFTS ANGIOGRAPHY;  Surgeon: Nigel Mormon,  MD;  Location: [place] [facility];  Service: Cardiovascular;  Laterality: N/A;  . POLYPECTOMY  09/19/2018   Procedure: POLYPECTOMY;  Surgeon: Thornton Park, MD;  [facility];  Service: Gastroenterology;;  . Lia Foyer INJECTION  09/19/2018   Procedure: SUBMUCOSAL INJECTION;  Surgeon: Thornton Park, MD;  [facility]: [facility];  Service: Gastroenterology;;  . Lia Foyer LIFTING INJECTION  12/18/2018   Procedure: SUBMUCOSAL LIFTING INJECTION;  Surgeon: Irving Copas., MD;  Location: [facility];  Service: Gastroenterology;;    Family History  Problem Relation Age of Onset  . Uterine cancer Mother   . Lung cancer Mother   . Hyperlipidemia Father   . Heart disease Father   . Hypertension Father   . Diabetes Father   . Colon cancer Neg Hx     SOCIAL HX: former smoker   Current Outpatient Medications:  .  amiodarone (PACERONE) 200 MG tablet, Take 0.5 tablets (100 mg total) by mouth daily. (Patient taking differently: Take 200 mg by mouth daily.), Disp: , Rfl:  .  aspirin EC 81 MG EC tablet, Take 1 tablet (81 mg total) by mouth daily., Disp:  , Rfl:  .  carvedilol (COREG) 12.5 MG tablet, Take 2 tablets (25 mg total) by mouth 2 (two) times daily., Disp: 180 tablet, Rfl: 3 .  Cholecalciferol (VITAMIN D3) 50 MCG (2000 UT) TABS, Take 2,000 Units by mouth 2 (two) times daily. , Disp: , Rfl:  .  Cyanocobalamin 2500 MCG CHEW, Chew 2,500 mcg by mouth daily. , Disp: , Rfl:  .  insulin aspart protamine- aspart (NOVOLOG MIX 70/30) (70-30) 100 UNIT/ML injection, Inject 0.66 mLs (66 Units total) into the skin daily with breakfast AND 0.6 mLs (60 Units total) daily with supper. (Patient taking differently: Inject 66 units into the skin in the morning with breakfast and 60 units with supper), Disp: 10 mL, Rfl: 11 .  isosorbide mononitrate (IMDUR) 30 MG 24 hr tablet, Take 1 tablet (30 mg total) by mouth daily., Disp:  , Rfl:  .  liraglutide (VICTOZA) 18 MG/3ML SOPN, Inject 1.8 mg  into the skin daily., Disp: , Rfl:  .  metFORMIN (GLUCOPHAGE-XR) 500 MG 24 hr tablet, Take 1,000 mg by mouth 2 (two) times daily., Disp: , Rfl:  .  nitroGLYCERIN (NITROSTAT) 0.4 MG SL tablet, Place 1 tablet (0.4 mg total) under the tongue every 5 (five) minutes x 3 doses as needed for chest pain., Disp:  , Rfl: 12 .  Omega-3 Fatty Acids (FISH OIL) 1000 MG CAPS, Take 2,000 mg by mouth 3 (three) times daily. , Disp: , Rfl:  .  OXYGEN, Inhale 2 L/min into the lungs See admin instructions. 2 L/min at bedtime in conjunction with CPAP, Disp: , Rfl:  .  potassium chloride SA (KLOR-CON) 20 MEQ tablet, TAKE 1 TABLET BY MOUTH TWICE A DAY (Patient taking differently: Take 20 mEq by mouth 2 (two) times daily.), Disp: 60 tablet, Rfl: 1 .  PRESCRIPTION MEDICATION, Inhale into  the lungs See admin instructions. CPAP- At bedtime, Disp: , Rfl:  .  rosuvastatin (CRESTOR) 40 MG tablet, Take 1 tablet (40 mg total) by mouth at bedtime., Disp:  , Rfl:  .  sacubitril-valsartan (ENTRESTO) 97-103 MG, Take 1 tablet by mouth 2 (two) times daily., Disp: 60 tablet, Rfl:  .  sertraline (ZOLOFT) 100 MG tablet, Take 50 mg by mouth daily., Disp: , Rfl:  .  ticagrelor (BRILINTA) 90 MG TABS tablet, Take 1 tablet (90 mg total) by mouth 2 (two) times daily., Disp: 60 tablet, Rfl:  .  torsemide (DEMADEX) 20 MG tablet, TAKE 1 TABLET BY MOUTH TWICE A DAY (Patient taking differently: Take 20 mg by mouth daily as needed (for an overnight weight gain of 3 pounds or more).), Disp: 60 tablet, Rfl: 0  EXAM: This was a telehealth telephone visit and thus no physical exam was completed.  ASSESSMENT AND PLAN:  Discussed the following assessment and plan:  Problem List Items Addressed This Visit    CHF (congestive heart failure) ([place])    Chronic issue.  Stable.  He will continue Imdur 30 mg daily, Entresto 97--103 mg 1 tablet twice daily, Brilinta 90 mg twice daily, torsemide 20 mg twice daily, and carvedilol 25 mg twice daily.      Coronary  artery disease involving autologous artery coronary bypass graft without angina pectoris    Asymptomatic.  He will continue medications as outlined.      HTN (hypertension), benign    Adequately controlled when he saw cardiology.  He will continue his current regimen as outlined.      Hyperlipidemia    Continue Crestor 40 mg daily.      Insulin dependent type 2 diabetes mellitus (HCC)    Uncontrolled though his numbers are quite a bit improved.  He will continue insulin 70/30 66 units in the morning and 60 units in the evening, Metformin 1000 mg twice daily, and Victoza 1.8 mg daily.  His medicines are managed by endocrinology.  We will plan for an A1c in a little over a month.      Relevant Orders   POCT HgB A1C       I discussed the assessment and treatment plan with the patient. The patient was provided an opportunity to ask questions and all were answered. The patient agreed with the plan and demonstrated an understanding of the instructions.   The patient was advised to call back or seek an in-person evaluation if the symptoms worsen or if the condition fails to improve as anticipated.  I provided 6 minutes of non-face-to-face time during this encounter.   Tommi Rumps, MD

## 2020-02-04 NOTE — Assessment & Plan Note (Signed)
Continue Crestor 40 mg daily.

## 2020-02-04 NOTE — Assessment & Plan Note (Signed)
Chronic issue.  Stable.  He will continue Imdur 30 mg daily, Entresto 97--103 mg 1 tablet twice daily, Brilinta 90 mg twice daily, torsemide 20 mg twice daily, and carvedilol 25 mg twice daily.

## 2020-02-04 NOTE — Assessment & Plan Note (Signed)
Asymptomatic.  He will continue medications as outlined.

## 2020-02-04 NOTE — Assessment & Plan Note (Signed)
Adequately controlled when he saw cardiology.  He will continue his current regimen as outlined.

## 2020-02-04 NOTE — Assessment & Plan Note (Signed)
Uncontrolled though his numbers are quite a bit improved.  He will continue insulin 70/30 66 units in the morning and 60 units in the evening, Metformin 1000 mg twice daily, and Victoza 1.8 mg daily.  His medicines are managed by endocrinology.  We will plan for an A1c in a little over a month.

## 2020-02-07 DIAGNOSIS — I5043 Acute on chronic combined systolic (congestive) and diastolic (congestive) heart failure: Secondary | ICD-10-CM | POA: Diagnosis not present

## 2020-02-13 DIAGNOSIS — H5211 Myopia, right eye: Secondary | ICD-10-CM | POA: Diagnosis not present

## 2020-02-13 DIAGNOSIS — H52223 Regular astigmatism, bilateral: Secondary | ICD-10-CM | POA: Diagnosis not present

## 2020-02-13 DIAGNOSIS — H5202 Hypermetropia, left eye: Secondary | ICD-10-CM | POA: Diagnosis not present

## 2020-02-13 DIAGNOSIS — H25811 Combined forms of age-related cataract, right eye: Secondary | ICD-10-CM | POA: Diagnosis not present

## 2020-02-13 DIAGNOSIS — E113553 Type 2 diabetes mellitus with stable proliferative diabetic retinopathy, bilateral: Secondary | ICD-10-CM | POA: Diagnosis not present

## 2020-02-25 DIAGNOSIS — I5022 Chronic systolic (congestive) heart failure: Secondary | ICD-10-CM | POA: Diagnosis not present

## 2020-02-28 DIAGNOSIS — I5042 Chronic combined systolic (congestive) and diastolic (congestive) heart failure: Secondary | ICD-10-CM | POA: Diagnosis not present

## 2020-02-28 DIAGNOSIS — Z95810 Presence of automatic (implantable) cardiac defibrillator: Secondary | ICD-10-CM | POA: Diagnosis not present

## 2020-02-28 DIAGNOSIS — Z4502 Encounter for adjustment and management of automatic implantable cardiac defibrillator: Secondary | ICD-10-CM | POA: Diagnosis not present

## 2020-03-09 DIAGNOSIS — I5043 Acute on chronic combined systolic (congestive) and diastolic (congestive) heart failure: Secondary | ICD-10-CM | POA: Diagnosis not present

## 2020-03-13 ENCOUNTER — Telehealth: Payer: Self-pay

## 2020-03-13 DIAGNOSIS — I5042 Chronic combined systolic (congestive) and diastolic (congestive) heart failure: Secondary | ICD-10-CM

## 2020-03-13 DIAGNOSIS — I255 Ischemic cardiomyopathy: Secondary | ICD-10-CM

## 2020-03-13 MED ORDER — TICAGRELOR 90 MG PO TABS: 90.0000 mg | ORAL_TABLET | Freq: Two times a day (BID) | ORAL | Status: DC

## 2020-03-13 MED ORDER — AMIODARONE HCL 200 MG PO TABS: 200.0000 mg | ORAL_TABLET | Freq: Every day | ORAL | Status: DC

## 2020-03-13 MED ORDER — SACUBITRIL-VALSARTAN 97-103 MG PO TABS: 1.0000 | ORAL_TABLET | Freq: Two times a day (BID) | ORAL | Status: DC

## 2020-03-17 ENCOUNTER — Other Ambulatory Visit: Payer: No Typology Code available for payment source

## 2020-03-18 ENCOUNTER — Ambulatory Visit: Payer: No Typology Code available for payment source

## 2020-03-18 ENCOUNTER — Other Ambulatory Visit: Payer: Self-pay

## 2020-03-18 DIAGNOSIS — R0989 Other specified symptoms and signs involving the circulatory and respiratory systems: Secondary | ICD-10-CM

## 2020-03-27 DIAGNOSIS — I5022 Chronic systolic (congestive) heart failure: Secondary | ICD-10-CM | POA: Diagnosis not present

## 2020-03-31 NOTE — Telephone Encounter (Signed)
Error

## 2020-04-04 ENCOUNTER — Other Ambulatory Visit: Payer: Self-pay

## 2020-04-04 ENCOUNTER — Encounter: Payer: Self-pay | Admitting: Cardiology

## 2020-04-04 ENCOUNTER — Ambulatory Visit: Payer: No Typology Code available for payment source | Admitting: Cardiology

## 2020-04-04 VITALS — BP 100/64 | HR 69 | Resp 17 | Ht 67.0 in | Wt 223.6 lb

## 2020-04-04 DIAGNOSIS — Z95810 Presence of automatic (implantable) cardiac defibrillator: Secondary | ICD-10-CM | POA: Diagnosis not present

## 2020-04-04 DIAGNOSIS — I5042 Chronic combined systolic (congestive) and diastolic (congestive) heart failure: Secondary | ICD-10-CM | POA: Diagnosis not present

## 2020-04-04 DIAGNOSIS — I10 Essential (primary) hypertension: Secondary | ICD-10-CM | POA: Diagnosis not present

## 2020-04-04 DIAGNOSIS — I6523 Occlusion and stenosis of bilateral carotid arteries: Secondary | ICD-10-CM | POA: Diagnosis not present

## 2020-04-04 DIAGNOSIS — I255 Ischemic cardiomyopathy: Secondary | ICD-10-CM | POA: Diagnosis not present

## 2020-04-04 DIAGNOSIS — N1832 Chronic kidney disease, stage 3b: Secondary | ICD-10-CM | POA: Diagnosis not present

## 2020-04-04 MED ORDER — NITROGLYCERIN 0.4 MG SL SUBL: 0.4000 mg | SUBLINGUAL_TABLET | SUBLINGUAL | 3 refills | Status: DC | PRN

## 2020-04-04 MED ORDER — TORSEMIDE 20 MG PO TABS: 20.0000 mg | ORAL_TABLET | Freq: Every day | ORAL | 3 refills | Status: DC | PRN

## 2020-04-04 MED ORDER — POTASSIUM CHLORIDE CRYS ER 20 MEQ PO TBCR
20.0000 meq | EXTENDED_RELEASE_TABLET | Freq: Two times a day (BID) | ORAL | 3 refills | Status: DC
Start: 2020-04-04 — End: 2021-03-25

## 2020-04-04 NOTE — Progress Notes (Signed)
Primary Physician/Referring:  [name], MD  Patient ID: Martin Bush, male    DOB: 01-Apr-1955, 65 y.o.   MRN: [id number]  Chief Complaint  Patient presents with  . Follow-up    3 month  . Congestive Heart Failure  . Coronary Artery Disease   HPI:    Maleko Greulich  is a 65 y.o. Caucasian male with CAD and history of CABG x2 in 2002  with only LIMA to LAD being patent, Circumflex coronary artery is super dominant. History of stenting to circumflex/OM bifurcation due to recurrent restenosis in 2015, left main and circumflex proximal and midsegment in 2016, angioplasty to left main and proximal and mid circumflex in 2020 and again 10/08/2019. He presented with acute pulmonary edema again on 12/11/2019 and repeat cardiac catheterization essentially revealed widely patent circumflex stent.  Past medical history significant for ischemic cardiomyopathy with severe LV systolic dysfunction, recurrent sustained VT SP ICD implantation, hypertension, mixed hyperlipidemia, uncontrolled diabetes mellitus with stage III chronic kidney disease.  He was evaluated at [facility] on 03/27/2020, Jardiance and Victoza was discontinued and patient was started on semaglutide (Ozempic).  Since then he has noticed low blood pressure, but overall doing well.  He is also started noticing that he is losing weight.  He had his wife are trying to make lifestyle changes, he has not had any worsening dyspnea, leg edema, chest pain or palpitations.  Past Medical History:  Diagnosis Date  . AICD (automatic cardioverter/defibrillator) present    Medtronic  . Arthritis    "thumbs"  (08/02/2017)  . CHF (congestive heart failure) ([place])   . Chronic combined systolic and diastolic heart failure ([place])    a. 08/2017 Echo: EF 20-25%, mod glob HK. Sev distal ant sept, inflat HK. Apical AK. Gr2 DD. Mildly reduced RV fxn.  . Colon polyps   . Coronary artery disease    a. s/p CABG x 2 (LIMA->LAD, VG->OM); b. Multiple  PCI's to LM/LCX/OM; c. 07/2017 PTCA of LM/LCX w/ early ISR-->repeat PCI/DES to LM (3.5x20 Synergy DES) and LCX (3.0x20 Synergy DES); d. 07/2018 Relook Cath: LM patent stent, LAD 100ost, LCX patent stent, OM1 99/60, LIMA->LAD ok. VG->dLCX 61 (old).  . Depression   . Encounter for assessment of implantable cardioverter-defibrillator (ICD) 09/27/2018  . High cholesterol   . Hypertension   . ICD; Biventricular  Medtronic ICD Amplia MRI QWuad CRT-D  in situ 10/29/14 10/29/2014   Remote ICD check 09.23.20:  One 6 beat NSVT. No therapy.  1 SVT episode @ 130 bpm (38 Sec).  There were 23 Vent sense episodes detected for up to 1.1 min/day (AT). Health trends (patient activity, heart rate variability, average heart rates) are stable.Trans-thoracic impedance trends and the OptiVol Fluid Index do no present significant abnormalities. Battery longevity is 4.3 years. RA pacing is 3  . Ischemic cardiomyopathy 09/27/2018  . MI (myocardial infarction) ([place]) 2003  . NSTEMI (non-ST elevated myocardial infarction) ([place]) 07/16/2014  . OSA on CPAP   . Oxygen deficiency   . Pneumonia 10/2014  . Proteinuria   . Sleep apnea   . Type II diabetes mellitus (HCC)    insulin dependent   Past Surgical History:  Procedure Laterality Date  . BIOPSY  09/19/2018   Procedure: BIOPSY;  Surgeon: Thornton Park, MD;  Location: [facility];  Service: Gastroenterology;;  . CARDIAC CATHETERIZATION N/A 07/16/2014   Procedure: Left Heart Cath and Coronary Angiography;  Surgeon: Adrian Prows, MD;  Location: [place] [facility];  Service:  Cardiovascular;  Laterality: N/A;  . CARDIAC CATHETERIZATION  07/16/2014   Procedure: Coronary Balloon Angioplasty;  Surgeon: Adrian Prows, MD;  Location: [place] [facility];  Service: Cardiovascular;;  . CARDIAC CATHETERIZATION  2003  . CARDIAC DEFIBRILLATOR PLACEMENT  2016  . CATARACT EXTRACTION W/ INTRAOCULAR LENS IMPLANT Left 03/2014  . COLONOSCOPY    . COLONOSCOPY WITH PROPOFOL N/A 09/19/2018   Procedure:  COLONOSCOPY WITH PROPOFOL;  Surgeon: Thornton Park, MD;  Location: [facility];  Service: Gastroenterology;  Laterality: N/A;  . CORONARY ANGIOPLASTY WITH STENT PLACEMENT     "I've got a total of 8 stents in there; mostly doine at [facility]" (08/02/2017)  . CORONARY ARTERY BYPASS GRAFT  ~ 2003   "CABG X2"; [facility]  . CORONARY ATHERECTOMY N/A 08/16/2017   Procedure: CORONARY ATHERECTOMY;  Surgeon: Nigel Mormon, MD;  Location: [place] [facility];  Service: Cardiovascular;  Laterality: N/A;  . CORONARY BALLOON ANGIOPLASTY N/A 08/04/2017   Procedure: CORONARY BALLOON ANGIOPLASTY;  Surgeon: Adrian Prows, MD;  Location: [place] [facility];  Service: Cardiovascular;  Laterality: N/A;  . CORONARY BALLOON ANGIOPLASTY N/A 01/11/2019   Procedure: CORONARY BALLOON ANGIOPLASTY;  Surgeon: Adrian Prows, MD;  Location: [place] [facility];  Service: Cardiovascular;  Laterality: N/A;  . CORONARY BALLOON ANGIOPLASTY N/A 10/09/2019   Procedure: CORONARY BALLOON ANGIOPLASTY;  Surgeon: Adrian Prows, MD;  Location: [place] [facility];  Service: Cardiovascular;  Laterality: N/A;  . CORONARY STENT INTERVENTION N/A 08/16/2017   Procedure: CORONARY STENT INTERVENTION;  Surgeon: Nigel Mormon, MD;  Location: [place] [facility];  Service: Cardiovascular;  Laterality: N/A;  . CORONARY STENT INTERVENTION N/A 10/09/2019   Procedure: CORONARY STENT INTERVENTION;  Surgeon: Adrian Prows, MD;  Location: [place] [facility];  Service: Cardiovascular;  Laterality: N/A;  . ELBOW SURGERY Left ?2001   "pinched nerve"  . ENDOSCOPIC MUCOSAL RESECTION  12/18/2018   Procedure: ENDOSCOPIC MUCOSAL RESECTION;  Surgeon: Rush Landmark Telford Nab., MD;  Location: [place];  Service: Gastroenterology;;  . Otho Darner SIGMOIDOSCOPY N/A 12/18/2018   Procedure: Beryle Quant;  Surgeon: Irving Copas., MD;  Location: [place];  Service: Gastroenterology;  Laterality: N/A;  . HEMOSTASIS CLIP PLACEMENT  12/18/2018    Procedure: HEMOSTASIS CLIP PLACEMENT;  Surgeon: Irving Copas., MD;  Location: [place];  Service: Gastroenterology;;  . LEFT HEART CATH AND CORONARY ANGIOGRAPHY N/A 01/11/2019   Procedure: LEFT HEART CATH AND CORONARY ANGIOGRAPHY;  Surgeon: Adrian Prows, MD;  Location: [place] [facility];  Service: Cardiovascular;  Laterality: N/A;  . LEFT HEART CATH AND CORS/GRAFTS ANGIOGRAPHY N/A 08/04/2017   Procedure: LEFT HEART CATH AND CORS/GRAFTS ANGIOGRAPHY;  Surgeon: Adrian Prows, MD;  Location: [place] [facility];  Service: Cardiovascular;  Laterality: N/A;  . LEFT HEART CATH AND CORS/GRAFTS ANGIOGRAPHY N/A 08/20/2017   Procedure: LEFT HEART CATH AND CORS/GRAFTS ANGIOGRAPHY;  Surgeon: Nigel Mormon, MD;  Location: [place] [facility];  Service: Cardiovascular;  Laterality: N/A;  . LEFT HEART CATH AND CORS/GRAFTS ANGIOGRAPHY N/A 01/08/2019   Procedure: LEFT HEART CATH AND CORS/GRAFTS ANGIOGRAPHY;  Surgeon: Wellington Hampshire, MD;  Location: [place] [facility];  Service: Cardiovascular;  Laterality: N/A;  . LEFT HEART CATH AND CORS/GRAFTS ANGIOGRAPHY N/A 10/09/2019   Procedure: LEFT HEART CATH AND CORS/GRAFTS ANGIOGRAPHY;  Surgeon: Adrian Prows, MD;  Location: [place] [facility];  Service: Cardiovascular;  Laterality: N/A;  . LEFT HEART CATH AND CORS/GRAFTS ANGIOGRAPHY N/A 12/11/2019   Procedure: LEFT HEART CATH AND CORS/GRAFTS ANGIOGRAPHY;  Surgeon: Nigel Mormon, MD;  Location: [place] [facility];  Service: Cardiovascular;  Laterality: N/A;  . POLYPECTOMY  09/19/2018   Procedure: POLYPECTOMY;  Surgeon: Thornton Park, MD;  [facility];  Service: Gastroenterology;;  . Lia Foyer INJECTION  09/19/2018   Procedure: SUBMUCOSAL INJECTION;  Surgeon: Thornton Park, MD;  [facility]: [facility];  Service: Gastroenterology;;  . Lia Foyer LIFTING INJECTION  12/18/2018   Procedure: SUBMUCOSAL LIFTING INJECTION;  Surgeon: Irving Copas., MD;  Location: [facility];   Service: Gastroenterology;;    Family History  Problem Relation Age of Onset  . Uterine cancer Mother   . Lung cancer Mother   . Hyperlipidemia Father   . Heart disease Father   . Hypertension Father   . Diabetes Father   . Colon cancer Neg Hx    Social History   Tobacco Use  . Smoking status: Former Smoker    Packs/day: 0.25    Years: 27.00    Pack years: 6.75    Types: Cigars    Quit date: 2002    Years since quitting: 20.2  . Smokeless tobacco: Former Systems developer    Types: Chew    Quit date: 2002  Substance Use Topics  . Alcohol use: Not Currently    Marital Status: Married ROS  Review of Systems  Cardiovascular: Positive for dyspnea on exertion (improved). Negative for chest pain, claudication, leg swelling, orthopnea, palpitations, paroxysmal nocturnal dyspnea and syncope.  Respiratory: Negative for shortness of breath.   Hematologic/Lymphatic: Does not bruise/bleed easily.  Gastrointestinal: Negative for melena.  Neurological: Negative for dizziness.   Objective  Blood pressure 100/64, pulse 69, resp. rate 17, height _0  (1.702 m), weight 223 lb 9.6 oz (101.4 kg), SpO2 96 %.  Vitals with BMI 04/04/2020 02/04/2020 01/08/2020  Height _1  _2  _3   Weight 223 lbs 10 oz 222 lbs 222 lbs  BMI 35.01 50.93 26.71  Systolic 245 - 809  Diastolic 64 - 74  Pulse 69 - 75     Physical Exam Vitals reviewed.  Constitutional:      Comments: He is moderately built and mildly obese in no acute distress.  Cardiovascular:     Rate and Rhythm: Normal rate and regular rhythm.     Pulses:          Carotid pulses are 1+ on the right side with bruit and 1+ on the left side with bruit.      Femoral pulses are 1+ on the right side and 1+ on the left side.      Popliteal pulses are 1+ on the right side and 1+ on the left side.       Dorsalis pedis pulses are 0 on the right side and 1+ on the left side.       Posterior tibial pulses are 1+ on the right side and 0 on the left side.      Heart sounds: Normal heart sounds.     Comments: No JVD.  1+ pitting edema below knee bilateral  Pulmonary:     Effort: Pulmonary effort is normal. No accessory muscle usage or respiratory distress.     Breath sounds: Normal breath sounds.    Laboratory examination:   Recent Labs    10/23/19 1347 11/14/19 1514 12/06/19 1431 12/09/19 0252 12/10/19 0218 12/11/19 0414 12/12/19 0337  NA 136 138 141   < > 133* 136 135  K 4.6 4.8 5.1   < > 3.9 3.9 4.2  CL 100  102 103   < > 100 102 101  CO2 _0 < > _1 GLUCOSE 356* 362* 377*   < > 283* 102* 106*  BUN 28* 24 17   < > 27* 32* 31*  CREATININE 1.32* 1.43* 1.30*   < > 1.66* 1.57* 1.45*  CALCIUM 9.4 9.3 8.9   < > 8.6* 8.8* 8.9  GFRNONAA 57* 52* 58*   < > 46* 49* 54*  GFRAA 66 60 67  --   --   --   --    < > = values in this interval not displayed.   CrCl cannot be calculated (Patient's most recent lab result is older than the maximum 21 days allowed.).  CMP Latest Ref Rng & Units 12/12/2019 12/11/2019 12/10/2019  Glucose 70 - 99 mg/dL 106(H) 102(H) 283(H)  BUN 8 - 23 mg/dL 31(H) 32(H) 27(H)  Creatinine 0.61 - 1.24 mg/dL 1.45(H) 1.57(H) 1.66(H)  Sodium 135 - 145 mmol/L 135 136 133(L)  Potassium 3.5 - 5.1 mmol/L 4.2 3.9 3.9  Chloride 98 - 111 mmol/L 101 102 100  CO2 22 - 32 mmol/L _2 Calcium 8.9 - 10.3 mg/dL 8.9 8.8(L) 8.6(L)  Total Protein 6.5 - 8.1 g/dL - - -  Total Bilirubin 0.3 - 1.2 mg/dL - - -  Alkaline Phos 38 - 126 U/L - - -  AST 15 - 41 U/L - - -  ALT 0 - 44 U/L - - -   CBC Latest Ref Rng & Units 12/12/2019 12/11/2019 12/10/2019  WBC 4.0 - 10.5 K/uL 6.8 6.9 11.2(H)  Hemoglobin 13.0 - 17.0 g/dL 10.3(L) 10.1(L) 11.2(L)  Hematocrit 39.0 - 52.0 % 32.4(L) 31.5(L) 35.3(L)  Platelets 150 - 400 K/uL 195 180 176   Lipid Panel Recent Labs    04/18/19 1019  CHOL 127  TRIG 181*  LDLCALC 58  HDL 39*    HEMOGLOBIN A1C Lab Results  Component Value Date   HGBA1C 10.8 (H) 12/09/2019   MPG 263.26  12/09/2019   TSH Recent Labs    04/18/19 1019 10/09/19 0559  TSH 1.460 1.422   BNP    Component Value Date/Time   BNP 910.0 (H) 12/09/2019 0243   External labs:    Labs 01/10/2021:  Hemoglobin 12.4/hematocrit 38.2.  Platelets 198.  Serum creatinine 1.62, BUN 34, sodium 134, potassium 4.5, EGFR 43 mL.  CMP otherwise normal.  A1c 10.6%.  TSH normal.  Total cholesterol 129, triglycerides 140, HDL 46, LDL 55.  Medications and allergies   Allergies  Allergen Reactions  . Diltiazem Rash    Current Outpatient Medications on File Prior to Visit  Medication Sig Dispense Refill  . amiodarone (PACERONE) 200 MG tablet Take 1 tablet (200 mg total) by mouth daily.    [name] Kitchen aspirin EC 81 MG EC tablet Take 1 tablet (81 mg total) by mouth daily.    . carvedilol (COREG) 12.5 MG tablet Take 2 tablets (25 mg total) by mouth 2 (two) times daily. 180 tablet 3  . Cholecalciferol (VITAMIN D3) 50 MCG (2000 UT) TABS Take 2,000 Units by mouth 2 (two) times daily.     . Cyanocobalamin 2500 MCG CHEW Chew 2,500 mcg by mouth daily.     . insulin aspart protamine- aspart (NOVOLOG MIX 70/30) (70-30) 100 UNIT/ML injection Inject 0.66 mLs (66 Units total) into the skin daily with breakfast AND 0.6 mLs (60 Units total) daily with supper. (Patient taking differently: Inject 66 units into the  skin in the morning with breakfast and 60 units with supper) 10 mL 11  . metFORMIN (GLUCOPHAGE-XR) 500 MG 24 hr tablet Take 1,000 mg by mouth 2 (two) times daily.    . Omega-3 Fatty Acids (FISH OIL) 1000 MG CAPS Take 2,000 mg by mouth 3 (three) times daily.     . OXYGEN Inhale 2 L/min into the lungs See admin instructions. 2 L/min at bedtime in conjunction with CPAP    . PRESCRIPTION MEDICATION Inhale into the lungs See admin instructions. CPAP- At bedtime    . rosuvastatin (CRESTOR) 40 MG tablet Take 1 tablet (40 mg total) by mouth at bedtime.    . sacubitril-valsartan (ENTRESTO) 97-103 MG Take 1 tablet by mouth 2 (two)  times daily. 60 tablet   . Semaglutide, 1 MG/DOSE, 2 MG/1.5ML SOPN Inject 2 mg into the skin daily.    . sertraline (ZOLOFT) 100 MG tablet Take 50 mg by mouth daily.    . ticagrelor (BRILINTA) 90 MG TABS tablet Take 1 tablet (90 mg total) by mouth 2 (two) times daily. 60 tablet    No current facility-administered medications on file prior to visit.    Radiology:   Chest X-Ray 10/09/2019:  FINDINGS: Unchanged cardiomegaly. Worsened bilateral interstitial opacities. Remote median sternotomy with unchanged position of left chest wall 3 lead AICD. IMPRESSION: Cardiomegaly and worsened bilateral interstitial opacities likely indicating pulmonary edema.  Cardiac Studies:   Renal artery duplex 09/02/2017: Right: Abnormal right Resistive Index. No evidence of right renal  artery stenosis. Left: Normal left Resistive Index. No evidence of left renal artery stenosis. Mesenteric: Normal Celiac artery findings. 70 to 99% stenosis in the superior mesenteric artery.  Left Heart Catheterization12/14/2020:  1. Severe underlying three-vessel coronary artery disease with patent LIMA to LAD. Chronically occluded SVG to OM. Severe in-stent restenosis in left main stent extending into the proximal left circumflex. In addition, there is significant restenosis in the stent placed in the left posterior AV groove artery. The RCA is known to be small in size and severely diseased. 2. Left ventricular angiography was not performed due to chronic kidney disease. LVEDP was 13 mmHg.  Coronary angioplasty of the left main and mid circumflex coronary artery 01/11/2019: Successful Wolverine cutting balloon angioplasty of the left main, 3.5 x 10 mm balloon utilized and high-pressure 12 atmospheric pressure inflations performed throughout the in-stent restenotic left main and proximal ostial circumflex and also mid circumflex coronary artery, 99% reduced to 0% with TIMI II to TIMI-3 flow. 60 mill contrast  utilized.  Echocardiogram 12/18/2019:  Poor eco window and wall motion with reduced sensitivity.  Left ventricle cavity is mildly dilated. Dilated cardiomyopathy. Hypokinetic global wall motion. Abnormal septal wall motion due to post-operative coronary artery bypass graft. Indeterminate diastolic filling pattern due to paced rhythm. Moderately depressed LV systolic function with visual EF 30-35%.  Calculated EF 37%. Left atrial cavity is mildly dilated at 4.2 cm. Right ventricle cavity is normal in size. Normal right ventricular function. RV Pacemaker wires visualized. No significant change since 06/15/2019. EF previously 35-40%.   Left Heart Catheterization 10/09/2019:  LV: Normal LVEDP.  There was no pressure gradient across the aortic valve.  Angiogram not performed to conserve contrast. Right coronary artery nondominant and severely diffusely diseased unchanged from prior cardiac catheterization. Left main: Mid to distal left main has in-stent restenosis of 99% and extends into the dominant circumflex which also has proximal in-stent 99% stenosis with TIMI II flow.  Successful scoring balloon angioplasty with 3.5 x  15 mm Wolverine at high 14 atmospheric pressure, due to recall, haziness, stented with 4.0 x 18 mm resolute Onyx DES at 18 atmospheric pressure for 60 seconds, stenosis reduced in the left main and proximal in-stent restenosis from 99% to 0% with improvement in TIMI flow from 2-3. Successful PTCA and Cutting Balloon angioplasty with Wolverine 3.0 x 10 mm balloon in the mid segment of the dominant circumflex coronary artery in-stent restenosis.  Stenosis reduced to 0% at 14 atmospheric pressure inflations x2.  TIMI-3 to TIMI-3 flow maintained. LAD: Is occluded in the proximal segment.  Distal LAD supplied by LIMA.  LIMA to LAD is widely patent.  Left Heart Catheterization 12/11/2019:   LM: Protected. Patent LM/LCx stent LAD: Prox 100% occluded. Bypassed by patent LIMA attaching in  mid LAD        Mid to distal LAD 50% disease LCxL: Patent LM/LCx stent. OM3 60% stenosis RCA: Small caliber with severe diffuse disease         Dampening of the pressures noted on engagement  No change in coronary anatomy compared to prior cath in 09/2019 Current presentation likely due to acute on chronic HFrEF  Device check: Medtronic CRTD Amplia MRI QWuad CRT-D 10/29/14   Scheduled  In office ICD 11/22/19  Single (S)/Dual (D)/BV (M) BV Presenting APBP Pacer dependant: No. Underlying NSR @ 55/min. AP 48%, BP 98.4% AMS Episodes 1.  SVT 11 S. HVR 0 since prior record 11/22/2019.   Longevity 2.7 Years/Voltage. Charge time 9.8Sec. Lead measurements: Stable Histogram: Low (L)/normal (N)/high (H)  Normal Patient activity Low. Thoracic impedance: Fluid accumulation last one month now back to baseline  Observations: Normal BV ICD.  Changes: Changed AP rate to 50 from 60 for preserving battery. Patient is programmed to [place] pace 100%   Scheduled Remote ICD check 03/25/2020:  There were 0 atrial high rate episodes detected. There were 0 VF episodes, 0 VT episodes and 0 nonsustained episode of ventricular tachycardia. No further Ventricular sensing episode  consistent with slow nonsustained VT (8 seconds since last transmission 02/03/2020 ). Health trends (patient activity, heart rate variability, average heart rates) are stable. Trans-thoracic impedance trends and the Optivol Fluid Index do not present significant abnormalities. Battery longevity is 2.4 years. RA pacing is 20.8 %, RV pacing is 98.9 %, and LV pacing is 99.2 %.  EKG:      EKG 04/04/2020: AV paced rhythm.  No further analysis.   No significant change from 10/17/2019: AV paced rhythm at rate of 75 bpm. No further analysis.   Assessment     ICD-10-CM   1. HTN (hypertension), benign  I10 EKG 12-Lead  2. Ischemic cardiomyopathy  I25.5 nitroGLYCERIN (NITROSTAT) 0.4 MG SL tablet  3. Chronic combined systolic and diastolic heart  failure (HCC)  I50.42 potassium chloride SA (KLOR-CON) 20 MEQ tablet    torsemide (DEMADEX) 20 MG tablet  4. Biventricular implantable cardioverter-defibrillator (ICD) in situ  Z95.810   5. Stage 3b chronic kidney disease (HCC)  N18.32   6. Asymptomatic bilateral carotid artery stenosis  I65.23 Ambulatory referral to Vascular Surgery    Meds ordered this encounter  Medications  . potassium chloride SA (KLOR-CON) 20 MEQ tablet    Sig: Take 1 tablet (20 mEq total) by mouth 2 (two) times daily.    Dispense:  180 tablet    Refill:  3  . torsemide (DEMADEX) 20 MG tablet    Sig: Take 1 tablet (20 mg total) by mouth daily as needed (for an overnight  weight gain of 3 pounds or more).    Dispense:  90 tablet    Refill:  3    PLEASE SEND TO DR.Charissa Knowles PER PATIENT  . nitroGLYCERIN (NITROSTAT) 0.4 MG SL tablet    Sig: Place 1 tablet (0.4 mg total) under the tongue every 5 (five) minutes x 3 doses as needed for chest pain.    Dispense:  25 tablet    Refill:  3   Medications Discontinued During This Encounter  Medication Reason  . liraglutide (VICTOZA) 18 MG/3ML SOPN Error  . isosorbide mononitrate (IMDUR) 30 MG 24 hr tablet Discontinued by provider  . nitroGLYCERIN (NITROSTAT) 0.4 MG SL tablet Reorder  . torsemide (DEMADEX) 20 MG tablet Reorder  . potassium chloride SA (KLOR-CON) 20 MEQ tablet Reorder   Recommendations:   Halo Laski  is a 65 y.o. Caucasian male with CAD and history of CABG x2 in 2002  with only LIMA to LAD being patent, Circumflex coronary artery is super dominant. History of stenting to circumflex/OM bifurcation due to recurrent restenosis in 2015, left main and circumflex proximal and midsegment in 2016, angioplasty to left main and proximal and mid circumflex in 2020 and again 10/08/2019. He presented with acute pulmonary edema again on 12/11/2019 and repeat cardiac catheterization essentially revealed widely patent circumflex stent.  Past medical history significant for  ischemic cardiomyopathy with severe LV systolic dysfunction, recurrent sustained VT SP ICD implantation, hypertension, mixed hyperlipidemia, uncontrolled diabetes mellitus with stage III chronic kidney disease.  He was evaluated at [facility] on 03/27/2020, Jardiance and Victoza was discontinued and patient was started on semaglutide (Ozempic). Suspect his low blood pressure is related to this change, will discontinue isosorbide mononitrate as he is not having any active anginal symptoms.  Continue the beta-blocker at present dose.  Continue Entresto. ICD does not reveal recent fluid overload.  I reviewed the results of the carotid artery duplex.  He has developed high-grade bilateral carotid artery stenosis, will refer him to be evaluated by vascular surgery.  Otherwise lipids are at goal, I reviewed his external labs, renal function is also remained stable.  I would like to see him back in 6 weeks for follow-up.  Extremely complex patient with multiple medications, multiple medical issues.  I spent 40 minutes with coordination of care.    Adrian Prows, PA-C 04/04/2020, 5:38 PM Office: [phone]

## 2020-04-06 DIAGNOSIS — I5043 Acute on chronic combined systolic (congestive) and diastolic (congestive) heart failure: Secondary | ICD-10-CM | POA: Diagnosis not present

## 2020-04-21 ENCOUNTER — Telehealth: Payer: Self-pay | Admitting: Pharmacist

## 2020-04-21 NOTE — Telephone Encounter (Signed)
CARE PLAN ENTRY  04/21/2020 Name: Martin Bush MRN: [id number] DOB: [birth year]  Martin Bush is enrolled in Remote Patient Monitoring/Principle Care Monitoring.  Date of Enrollment: 05/08/19 Supervising physician: Martin Bush Indication: CHF  Remote Readings: Compliant. Wt stable ~216-218 lbs  Next scheduled OV: 05/19/20  Pharmacist Clinical Goal(s):  Martin Bush Kitchen Over the next 90 days, patient will demonstrate Improved medication adherence as evidenced by medication fill history . Over the next 90 days, patient will demonstrate improved understanding of prescribed medications and rationale for usage as evidenced by patient teach back . Over the next 90 days, patient will experience decrease in ED visits. ED visits in last 6 months = 0 . Over the next 90 days, patient will not experience hospital admission. Hospital Admissions in last 6 months = 0  Interventions: . Provider and Inter-disciplinary care team collaboration (see longitudinal plan of care) . Comprehensive medication review performed. . Discussed plans with patient for ongoing care management follow up and provided patient with direct contact information for care management team . Collaboration with provider re: medication management  Patient Self Care Activities:  . Self administers medications as prescribed . Attends all scheduled provider appointments . Performs ADL's independently . Performs IADL's independently  Allergies  Allergen Reactions  . Diltiazem Rash   Outpatient Encounter Medications as of 04/21/2020  Medication Sig Note  . amiodarone (PACERONE) 200 MG tablet Take 1 tablet (200 mg total) by mouth daily.   Martin Bush Kitchen aspirin EC 81 MG EC tablet Take 1 tablet (81 mg total) by mouth daily.   . carvedilol (COREG) 12.5 MG tablet Take 2 tablets (25 mg total) by mouth 2 (two) times daily.   . Cholecalciferol (VITAMIN D3) 50 MCG (2000 UT) TABS Take 2,000 Units by mouth 2 (two) times daily.    . Cyanocobalamin 2500 MCG  CHEW Chew 2,500 mcg by mouth daily.    . insulin aspart protamine- aspart (NOVOLOG MIX 70/30) (70-30) 100 UNIT/ML injection Inject 0.66 mLs (66 Units total) into the skin daily with breakfast AND 0.6 mLs (60 Units total) daily with supper. (Patient taking differently: Inject 66 units into the skin in the morning with breakfast and 60 units with supper)   . metFORMIN (GLUCOPHAGE-XR) 500 MG 24 hr tablet Take 1,000 mg by mouth 2 (two) times daily.   . nitroGLYCERIN (NITROSTAT) 0.4 MG SL tablet Place 1 tablet (0.4 mg total) under the tongue every 5 (five) minutes x 3 doses as needed for chest pain.   . Omega-3 Fatty Acids (FISH OIL) 1000 MG CAPS Take 2,000 mg by mouth 3 (three) times daily.    . OXYGEN Inhale 2 L/min into the lungs See admin instructions. 2 L/min at bedtime in conjunction with CPAP   . potassium chloride SA (KLOR-CON) 20 MEQ tablet Take 1 tablet (20 mEq total) by mouth 2 (two) times daily.   Martin Bush Kitchen PRESCRIPTION MEDICATION Inhale into the lungs See admin instructions. CPAP- At bedtime   . rosuvastatin (CRESTOR) 40 MG tablet Take 1 tablet (40 mg total) by mouth at bedtime.   . sacubitril-valsartan (ENTRESTO) 97-103 MG Take 1 tablet by mouth 2 (two) times daily.   . Semaglutide, 1 MG/DOSE, 2 MG/1.5ML SOPN Inject 2 mg into the skin daily.   . sertraline (ZOLOFT) 100 MG tablet Take 50 mg by mouth daily. 12/09/2019: 0.5 tablet = 50 mg  . ticagrelor (BRILINTA) 90 MG TABS tablet Take 1 tablet (90 mg total) by mouth 2 (two) times daily.   Martin Bush Kitchen torsemide (DEMADEX)  20 MG tablet Take 1 tablet (20 mg total) by mouth daily as needed (for an overnight weight gain of 3 pounds or more).    No facility-administered encounter medications on file as of 04/21/2020.   Heart Failure   Type: Systolic  Last ejection fraction: 30-35% NYHA Class: II (slight limitation of activity) AHA HF Stage: C (Heart disease and symptoms present)  Patient has failed these meds in past: metoprolol, lisinopril. Imdur,  hydralazine, spironolactone,    Patient is currently controlled on the following medications: Torsemide 20 mg PRN, Entresto 97/103 mg BID, Carvedilol 25 mg BID.   We discussed diet and exercise extensively  Plan  Continue current medications and control with diet and exercise ______________ Visit Information SDOH (Social Determinants of Health) assessments performed: Yes.  [name] was given information about Principle Care Management/Remote Patient Monitoring services today including:  1. RPM/PCM service includes personalized support from designated clinical staff supervised by his physician, including individualized plan of care and coordination with other care providers 2. 24/7 contact phone numbers for assistance for urgent and routine care needs. 3. Standard insurance, coinsurance, copays and deductibles apply for principle care management only during months in which we provide at least 30 minutes of these services. Most insurances cover these services at 100%, however patients may be responsible for any copay, coinsurance and/or deductible if applicable. This service may help you avoid the need for more expensive face-to-face services. 4. Only one practitioner may furnish and bill the service in a calendar month. 5. The patient may stop PCM/RPM services at any time (effective at the end of the month) by phone call to the office staff.  Patient agreed to services and verbal consent obtained.   Manuela Schwartz, Pharm.D. [facility] [phone] Ext: [phone],

## 2020-04-22 ENCOUNTER — Other Ambulatory Visit: Payer: Self-pay

## 2020-04-22 ENCOUNTER — Encounter: Payer: Self-pay | Admitting: Vascular Surgery

## 2020-04-22 ENCOUNTER — Ambulatory Visit: Payer: Medicare Other | Admitting: Vascular Surgery

## 2020-04-22 VITALS — BP 146/89 | HR 64 | Temp 97.1°F | Resp 18 | Ht 67.0 in | Wt 220.0 lb

## 2020-04-22 DIAGNOSIS — I6523 Occlusion and stenosis of bilateral carotid arteries: Secondary | ICD-10-CM | POA: Diagnosis not present

## 2020-04-22 NOTE — Progress Notes (Signed)
Patient name: Martin Bush MRN: [id number] DOB: 05-09-1955 Sex: male  REASON FOR CONSULT: High-grade bilateral carotid artery stenosis  HPI: Martin Bush is a 65 y.o. male, with history of diabetes, coronary artery disease status post CABG as well as previous MI with ischemic cardiomyopathy and severe LV dysfunction, multiple previous PCI's, hypertension, hyperlipidemia that presents for evaluation of bilateral carotid stenosis.  Patient denies any previous history of TIA or stroke.  He is being followed by Dr. Einar Gip who is his cardiologist.  Most recent ultrasound on 03/23/2020 showed high-grade bilateral ICA stenosis.  On the right he had a velocity of 404/132 greater than 80% stenosis and on the left a velocity of 405/184 with greater than 80% stenosis.  He is on aspirin and Brilinta.  His wife state that he had 3 heart attacks at the end of last year.  No previous neck surgery or neck radiation. He is on Brilinta.  Very sedentary at home.  Past Medical History:  Diagnosis Date  . AICD (automatic cardioverter/defibrillator) present    Medtronic  . Arthritis    "thumbs"  (08/02/2017)  . CHF (congestive heart failure) ([place])   . Chronic combined systolic and diastolic heart failure ([place])    a. 08/2017 Echo: EF 20-25%, mod glob HK. Sev distal ant sept, inflat HK. Apical AK. Gr2 DD. Mildly reduced RV fxn.  . Colon polyps   . Coronary artery disease    a. s/p CABG x 2 (LIMA->LAD, VG->OM); b. Multiple PCI's to LM/LCX/OM; c. 07/2017 PTCA of LM/LCX w/ early ISR-->repeat PCI/DES to LM (3.5x20 Synergy DES) and LCX (3.0x20 Synergy DES); d. 07/2018 Relook Cath: LM patent stent, LAD 100ost, LCX patent stent, OM1 99/60, LIMA->LAD ok. VG->dLCX 39 (old).  . Depression   . Encounter for assessment of implantable cardioverter-defibrillator (ICD) 09/27/2018  . High cholesterol   . Hypertension   . ICD; Biventricular  Medtronic ICD Amplia MRI QWuad CRT-D  in situ 10/29/14 10/29/2014   Remote ICD check  09.23.20:  One 6 beat NSVT. No therapy.  1 SVT episode @ 130 bpm (38 Sec).  There were 23 Vent sense episodes detected for up to 1.1 min/day (AT). Health trends (patient activity, heart rate variability, average heart rates) are stable.Trans-thoracic impedance trends and the OptiVol Fluid Index do no present significant abnormalities. Battery longevity is 4.3 years. RA pacing is 3  . Ischemic cardiomyopathy 09/27/2018  . MI (myocardial infarction) ([place]) 2003  . NSTEMI (non-ST elevated myocardial infarction) ([place]) 07/16/2014  . OSA on CPAP   . Oxygen deficiency   . Pneumonia 10/2014  . Proteinuria   . Sleep apnea   . Type II diabetes mellitus (H[facility])    insulin dependent    Past Surgical History:  Procedure Laterality Date  . BIOPSY  09/19/2018   Procedure: BIOPSY;  Surgeon: Thornton Park, MD;  Location: [facility];  Service: Gastroenterology;;  . CARDIAC CATHETERIZATION N/A 07/16/2014   Procedure: Left Heart Cath and Coronary Angiography;  Surgeon: Adrian Prows, MD;  Location: [place] [facility];  Service: Cardiovascular;  Laterality: N/A;  . CARDIAC CATHETERIZATION  07/16/2014   Procedure: Coronary Balloon Angioplasty;  Surgeon: Adrian Prows, MD;  Location: [place] [facility];  Service: Cardiovascular;;  . CARDIAC CATHETERIZATION  2003  . CARDIAC DEFIBRILLATOR PLACEMENT  2016  . CATARACT EXTRACTION W/ INTRAOCULAR LENS IMPLANT Left 03/2014  . COLONOSCOPY    . COLONOSCOPY WITH PROPOFOL N/A 09/19/2018   Procedure: COLONOSCOPY WITH PROPOFOL;  Surgeon: Thornton Park, MD;  Location: [facility];  Service: Gastroenterology;  Laterality: N/A;  . CORONARY ANGIOPLASTY WITH STENT PLACEMENT     "I've got a total of 8 stents in there; mostly doine at [facility]" (08/02/2017)  . CORONARY ARTERY BYPASS GRAFT  ~ 2003   "CABG X2"; [facility]  . CORONARY ATHERECTOMY N/A 08/16/2017   Procedure: CORONARY ATHERECTOMY;  Surgeon: Nigel Mormon, MD;  Location: [place] [facility];  Service:  Cardiovascular;  Laterality: N/A;  . CORONARY BALLOON ANGIOPLASTY N/A 08/04/2017   Procedure: CORONARY BALLOON ANGIOPLASTY;  Surgeon: Adrian Prows, MD;  Location: [place] [facility];  Service: Cardiovascular;  Laterality: N/A;  . CORONARY BALLOON ANGIOPLASTY N/A 01/11/2019   Procedure: CORONARY BALLOON ANGIOPLASTY;  Surgeon: Adrian Prows, MD;  Location: [place] [facility];  Service: Cardiovascular;  Laterality: N/A;  . CORONARY BALLOON ANGIOPLASTY N/A 10/09/2019   Procedure: CORONARY BALLOON ANGIOPLASTY;  Surgeon: Adrian Prows, MD;  Location: [place] [facility];  Service: Cardiovascular;  Laterality: N/A;  . CORONARY STENT INTERVENTION N/A 08/16/2017   Procedure: CORONARY STENT INTERVENTION;  Surgeon: Nigel Mormon, MD;  Location: [place] [facility];  Service: Cardiovascular;  Laterality: N/A;  . CORONARY STENT INTERVENTION N/A 10/09/2019   Procedure: CORONARY STENT INTERVENTION;  Surgeon: Adrian Prows, MD;  Location: [place] [facility];  Service: Cardiovascular;  Laterality: N/A;  . ELBOW SURGERY Left ?2001   "pinched nerve"  . ENDOSCOPIC MUCOSAL RESECTION  12/18/2018   Procedure: ENDOSCOPIC MUCOSAL RESECTION;  Surgeon: Rush Landmark Telford Nab., MD;  Location: [place];  Service: Gastroenterology;;  . Otho Darner SIGMOIDOSCOPY N/A 12/18/2018   Procedure: Beryle Quant;  Surgeon: Irving Copas., MD;  Location: [place];  Service: Gastroenterology;  Laterality: N/A;  . HEMOSTASIS CLIP PLACEMENT  12/18/2018   Procedure: HEMOSTASIS CLIP PLACEMENT;  Surgeon: Irving Copas., MD;  Location: [place];  Service: Gastroenterology;;  . LEFT HEART CATH AND CORONARY ANGIOGRAPHY N/A 01/11/2019   Procedure: LEFT HEART CATH AND CORONARY ANGIOGRAPHY;  Surgeon: Adrian Prows, MD;  Location: [place] [facility];  Service: Cardiovascular;  Laterality: N/A;  . LEFT HEART CATH AND CORS/GRAFTS ANGIOGRAPHY N/A 08/04/2017   Procedure: LEFT HEART CATH AND CORS/GRAFTS ANGIOGRAPHY;  Surgeon:  Adrian Prows, MD;  Location: [place] [facility];  Service: Cardiovascular;  Laterality: N/A;  . LEFT HEART CATH AND CORS/GRAFTS ANGIOGRAPHY N/A 08/20/2017   Procedure: LEFT HEART CATH AND CORS/GRAFTS ANGIOGRAPHY;  Surgeon: Nigel Mormon, MD;  Location: [place] [facility];  Service: Cardiovascular;  Laterality: N/A;  . LEFT HEART CATH AND CORS/GRAFTS ANGIOGRAPHY N/A 01/08/2019   Procedure: LEFT HEART CATH AND CORS/GRAFTS ANGIOGRAPHY;  Surgeon: Wellington Hampshire, MD;  Location: [place] [facility];  Service: Cardiovascular;  Laterality: N/A;  . LEFT HEART CATH AND CORS/GRAFTS ANGIOGRAPHY N/A 10/09/2019   Procedure: LEFT HEART CATH AND CORS/GRAFTS ANGIOGRAPHY;  Surgeon: Adrian Prows, MD;  Location: [place] [facility];  Service: Cardiovascular;  Laterality: N/A;  . LEFT HEART CATH AND CORS/GRAFTS ANGIOGRAPHY N/A 12/11/2019   Procedure: LEFT HEART CATH AND CORS/GRAFTS ANGIOGRAPHY;  Surgeon: Nigel Mormon, MD;  Location: [place] [facility];  Service: Cardiovascular;  Laterality: N/A;  . POLYPECTOMY  09/19/2018   Procedure: POLYPECTOMY;  Surgeon: Thornton Park, MD;  [facility];  Service: Gastroenterology;;  . Lia Foyer INJECTION  09/19/2018   Procedure: SUBMUCOSAL INJECTION;  Surgeon: Thornton Park, MD;  [facility]: [facility];  Service: Gastroenterology;;  . Lia Foyer LIFTING INJECTION  12/18/2018   Procedure: SUBMUCOSAL LIFTING INJECTION;  Surgeon: Rush Landmark,  Telford Nab., MD;  Location: [facility];  Service: Gastroenterology;;    Family History  Problem Relation Age of Onset  . Uterine cancer Mother   . Lung cancer Mother   . Hyperlipidemia Father   . Heart disease Father   . Hypertension Father   . Diabetes Father   . Colon cancer Neg Hx     SOCIAL HISTORY: Social History   Socioeconomic History  . Marital status: Married    Spouse name: Not on file  . Number of children: 3  . Years of education: Not on file  . Highest education level: Not on file   Occupational History  . Not on file  Tobacco Use  . Smoking status: Former Smoker    Packs/day: 0.25    Years: 27.00    Pack years: 6.75    Types: Cigars    Quit date: 2002    Years since quitting: 20.2  . Smokeless tobacco: Former Systems developer    Types: [place] date: 2002  Media planner  . Vaping Use: Never used  Substance and Sexual Activity  . Alcohol use: Not Currently  . Drug use: Never  . Sexual activity: Not Currently  Other Topics Concern  . Not on file  Social History Narrative  . Not on file   Social Determinants of Health   Financial Resource Strain: Low Risk   . Difficulty of Paying Living Expenses: Not hard at all  Food Insecurity: No Food Insecurity  . Worried About Charity fundraiser in the Last Year: Never true  . Ran Out of Food in the Last Year: Never true  Transportation Needs: No Transportation Needs  . Lack of Transportation (Medical): No  . Lack of Transportation (Non-Medical): No  Physical Activity: Not on file  Stress: No Stress Concern Present  . Feeling of Stress : Not at all  Social Connections: Not on file  Intimate Partner Violence: Not At Risk  . Fear of Current or Ex-Partner: No  . Emotionally Abused: No  . Physically Abused: No  . Sexually Abused: No    Allergies  Allergen Reactions  . Diltiazem Rash    Current Outpatient Medications  Medication Sig Dispense Refill  . amiodarone (PACERONE) 200 MG tablet Take 1 tablet (200 mg total) by mouth daily.    [name] Kitchen aspirin EC 81 MG EC tablet Take 1 tablet (81 mg total) by mouth daily.    . carvedilol (COREG) 12.5 MG tablet Take 2 tablets (25 mg total) by mouth 2 (two) times daily. 180 tablet 3  . Cholecalciferol (VITAMIN D3) 50 MCG (2000 UT) TABS Take 2,000 Units by mouth 2 (two) times daily.     . Cyanocobalamin 2500 MCG CHEW Chew 2,500 mcg by mouth daily.     . insulin aspart protamine- aspart (NOVOLOG MIX 70/30) (70-30) 100 UNIT/ML injection Inject 0.66 mLs (66 Units total) into the skin  daily with breakfast AND 0.6 mLs (60 Units total) daily with supper. (Patient taking differently: Inject 66 units into the skin in the morning with breakfast and 60 units with supper) 10 mL 11  . metFORMIN (GLUCOPHAGE-XR) 500 MG 24 hr tablet Take 1,000 mg by mouth 2 (two) times daily.    . nitroGLYCERIN (NITROSTAT) 0.4 MG SL tablet Place 1 tablet (0.4 mg total) under the tongue every 5 (five) minutes x 3 doses as needed for chest pain. 25 tablet 3  . Omega-3 Fatty Acids (FISH OIL) 1000 MG CAPS Take 2,000 mg by  mouth 3 (three) times daily.     . OXYGEN Inhale 2 L/min into the lungs See admin instructions. 2 L/min at bedtime in conjunction with CPAP    . potassium chloride SA (KLOR-CON) 20 MEQ tablet Take 1 tablet (20 mEq total) by mouth 2 (two) times daily. 180 tablet 3  . PRESCRIPTION MEDICATION Inhale into the lungs See admin instructions. CPAP- At bedtime    . rosuvastatin (CRESTOR) 40 MG tablet Take 1 tablet (40 mg total) by mouth at bedtime.    . sacubitril-valsartan (ENTRESTO) 97-103 MG Take 1 tablet by mouth 2 (two) times daily. 60 tablet   . Semaglutide, 1 MG/DOSE, 2 MG/1.5ML SOPN Inject 2 mg into the skin daily.    . sertraline (ZOLOFT) 100 MG tablet Take 50 mg by mouth daily.    . ticagrelor (BRILINTA) 90 MG TABS tablet Take 1 tablet (90 mg total) by mouth 2 (two) times daily. 60 tablet   . torsemide (DEMADEX) 20 MG tablet Take 1 tablet (20 mg total) by mouth daily as needed (for an overnight weight gain of 3 pounds or more). 90 tablet 3   No current facility-administered medications for this visit.    REVIEW OF SYSTEMS:  [X]  denotes positive finding, [ ]  denotes negative finding Cardiac  Comments:  Chest pain or chest pressure:    Shortness of breath upon exertion:    Short of breath when lying flat:    Irregular heart rhythm:        Vascular    Pain in calf, thigh, or hip brought on by ambulation:    Pain in feet at night that wakes you up from your sleep:     Blood clot in  your veins:    Leg swelling:         Pulmonary    Oxygen at home:    Productive cough:     Wheezing:         Neurologic    Sudden weakness in arms or legs:     Sudden numbness in arms or legs:     Sudden onset of difficulty speaking or slurred speech:    Temporary loss of vision in one eye:     Problems with dizziness:         Gastrointestinal    Blood in stool:     Vomited blood:         Genitourinary    Burning when urinating:     Blood in urine:        Psychiatric    Major depression:         Hematologic    Bleeding problems:    Problems with blood clotting too easily:        Skin    Rashes or ulcers:        Constitutional    Fever or chills:      PHYSICAL EXAM: Vitals:   04/22/20 1401 04/22/20 1404  BP: (!) 153/88 (!) 146/89  Pulse: 64 64  Resp: 18   Temp: (!) 97.1 F (36.2 C)   TempSrc: Temporal   SpO2: 97%   Weight: 220 lb (99.8 kg)   Height: 5\' 7"  (1.702 m)     GENERAL: The patient is a well-nourished male, in no acute distress. The vital signs are documented above. CARDIAC: There is a regular rate and rhythm.  VASCULAR:  Palpable radial pulses both upper extremities Palpable femoral pulses bilaterally PULMONARY: No respiratory distress. ABDOMEN: Soft and non-tender. MUSCULOSKELETAL: There are  no major deformities or cyanosis. NEUROLOGIC: No focal weakness or paresthesias are detected. SKIN: There are no ulcers or rashes noted. PSYCHIATRIC: The patient has a normal affect.  DATA:   Carotid duplex 03/18/2020 shows bilateral high-grade stenosis greater than 80%  Assessment/Plan:  65 year old male presents for evaluation of asymptomatic bilateral high-grade carotid artery ICA stenosis >80%.  I discussed with him given his comorbidities he is at high risk particularly given his cardiac history.  That being said, I think his best option would be evaluation for TCAR with transcarotid artery stenting and flow reversal for embolic protection.  He  would qualify given bilateral high-grade stenosis requiring treatment greater than 80%.  I have recommended CTA neck to further evaluate if his lesions are amendable to TCAR.  I will have him follow-up with me once the CT is done and we will further discuss.  At that time we can also make a decision about which lesion to do first.  We discussed goal of surgery is stroke risk reduction.  I quoted him 1% stroke risk from procedure.   [name], MD [facility] Office: [phone]

## 2020-04-26 DIAGNOSIS — I5022 Chronic systolic (congestive) heart failure: Secondary | ICD-10-CM | POA: Diagnosis not present

## 2020-04-26 DIAGNOSIS — I5033 Acute on chronic diastolic (congestive) heart failure: Secondary | ICD-10-CM | POA: Diagnosis not present

## 2020-04-30 ENCOUNTER — Ambulatory Visit (HOSPITAL_COMMUNITY): Payer: No Typology Code available for payment source

## 2020-05-06 ENCOUNTER — Ambulatory Visit: Payer: Medicare Other | Admitting: Vascular Surgery

## 2020-05-07 DIAGNOSIS — I5043 Acute on chronic combined systolic (congestive) and diastolic (congestive) heart failure: Secondary | ICD-10-CM | POA: Diagnosis not present

## 2020-05-08 ENCOUNTER — Other Ambulatory Visit: Payer: Self-pay

## 2020-05-08 DIAGNOSIS — I6523 Occlusion and stenosis of bilateral carotid arteries: Secondary | ICD-10-CM

## 2020-05-12 ENCOUNTER — Ambulatory Visit (HOSPITAL_COMMUNITY)
Admission: RE | Admit: 2020-05-12 | Discharge: 2020-05-12 | Disposition: A | Discharge status: Home / Self Care | Payer: No Typology Code available for payment source | Source: Ambulatory Visit | Attending: Vascular Surgery | Admitting: Vascular Surgery

## 2020-05-12 ENCOUNTER — Other Ambulatory Visit: Payer: Self-pay

## 2020-05-12 DIAGNOSIS — I6503 Occlusion and stenosis of bilateral vertebral arteries: Secondary | ICD-10-CM | POA: Diagnosis not present

## 2020-05-12 DIAGNOSIS — I771 Stricture of artery: Secondary | ICD-10-CM | POA: Diagnosis not present

## 2020-05-12 DIAGNOSIS — I6523 Occlusion and stenosis of bilateral carotid arteries: Secondary | ICD-10-CM | POA: Diagnosis not present

## 2020-05-12 DIAGNOSIS — I700 Atherosclerosis of aorta: Secondary | ICD-10-CM | POA: Diagnosis not present

## 2020-05-12 LAB — POCT I-STAT CREATININE: Creatinine, Ser: 1.2 mg/dL (ref 0.61–1.24)

## 2020-05-12 MED ORDER — IOHEXOL 350 MG/ML SOLN
75.0000 mL | Freq: Once | INTRAVENOUS | Status: AC | PRN
  Administered 2020-05-12: 75 mL via INTRAVENOUS

## 2020-05-19 ENCOUNTER — Encounter: Payer: Self-pay | Admitting: Cardiology

## 2020-05-19 ENCOUNTER — Other Ambulatory Visit: Payer: Self-pay

## 2020-05-19 ENCOUNTER — Ambulatory Visit: Payer: No Typology Code available for payment source | Admitting: Cardiology

## 2020-05-19 VITALS — BP 117/74 | HR 77 | Temp 98.1°F | Resp 16 | Ht 67.0 in | Wt 219.0 lb

## 2020-05-19 DIAGNOSIS — I5042 Chronic combined systolic (congestive) and diastolic (congestive) heart failure: Secondary | ICD-10-CM

## 2020-05-19 DIAGNOSIS — I6523 Occlusion and stenosis of bilateral carotid arteries: Secondary | ICD-10-CM

## 2020-05-19 DIAGNOSIS — I255 Ischemic cardiomyopathy: Secondary | ICD-10-CM

## 2020-05-19 DIAGNOSIS — N1832 Chronic kidney disease, stage 3b: Secondary | ICD-10-CM

## 2020-05-19 DIAGNOSIS — Z95810 Presence of automatic (implantable) cardiac defibrillator: Secondary | ICD-10-CM

## 2020-05-19 MED ORDER — NITROGLYCERIN 0.4 MG SL SUBL: 0.4000 mg | SUBLINGUAL_TABLET | SUBLINGUAL | 3 refills | Status: DC | PRN

## 2020-05-19 NOTE — Progress Notes (Signed)
Primary Physician/Referring:  [name], MD  Patient ID: Martin Bush, male    DOB: [birth year], 65 y.o.   MRN: [id number]  Chief Complaint  Patient presents with  . Coronary Artery Disease  . Carotis Stenosis  . Congestive Heart Failure  . Follow-up    6 weeks   HPI:    Martin Bush  is a 65 y.o. Caucasian male with CAD and history of CABG x2 in 2002  with only LIMA to LAD being patent, Circumflex coronary artery is super dominant. History of stenting to circumflex/OM bifurcation due to recurrent restenosis in 2015, left main and circumflex proximal and midsegment in 2016, angioplasty to left main and proximal and mid circumflex in 2020 and again 10/08/2019. He presented with acute pulmonary edema again on 12/11/2019 and repeat cardiac catheterization essentially revealed widely patent circumflex stent.  Past medical history significant for ischemic cardiomyopathy with severe LV systolic dysfunction, recurrent sustained VT SP ICD implantation, hypertension, mixed hyperlipidemia, uncontrolled diabetes mellitus with stage III chronic kidney disease.  He was evaluated at [facility] on 03/27/2020, Jardiance and Victoza was discontinued and patient was started on semaglutide (Ozempic).  When I saw him in the office 6 weeks ago, he developed significant dizziness, low blood pressure and hence I made cardiac medication changes which I suspect was related to [place].  He has maintained weight loss, he has not had any dyspnea denies any chest pain or palpitations or leg edema.  Accompanied by his wife.    Past Medical History:  Diagnosis Date  . AICD (automatic cardioverter/defibrillator) present    Medtronic  . Arthritis    "thumbs"  (08/02/2017)  . CHF (congestive heart failure) ([place])   . Chronic combined systolic and diastolic heart failure ([place])    a. 08/2017 Echo: EF 20-25%, mod glob HK. Sev distal ant sept, inflat HK. Apical AK. Gr2 DD. Mildly reduced RV fxn.  . Colon  polyps   . Coronary artery disease    a. s/p CABG x 2 (LIMA->LAD, VG->OM); b. Multiple PCI's to LM/LCX/OM; c. 07/2017 PTCA of LM/LCX w/ early ISR-->repeat PCI/DES to LM (3.5x20 Synergy DES) and LCX (3.0x20 Synergy DES); d. 07/2018 Relook Cath: LM patent stent, LAD 100ost, LCX patent stent, OM1 99/60, LIMA->LAD ok. VG->dLCX 87 (old).  . Depression   . Encounter for assessment of implantable cardioverter-defibrillator (ICD) 09/27/2018  . High cholesterol   . Hypertension   . ICD; Biventricular  Medtronic ICD Amplia MRI QWuad CRT-D  in situ 10/29/14 10/29/2014   Remote ICD check 09.23.20:  One 6 beat NSVT. No therapy.  1 SVT episode @ 130 bpm (38 Sec).  There were 23 Vent sense episodes detected for up to 1.1 min/day (AT). Health trends (patient activity, heart rate variability, average heart rates) are stable.Trans-thoracic impedance trends and the OptiVol Fluid Index do no present significant abnormalities. Battery longevity is 4.3 years. RA pacing is 3  . Ischemic cardiomyopathy 09/27/2018  . MI (myocardial infarction) ([place]) 2003  . NSTEMI (non-ST elevated myocardial infarction) ([place]) 07/16/2014  . OSA on CPAP   . Oxygen deficiency   . Pneumonia 10/2014  . Proteinuria   . Sleep apnea   . Type II diabetes mellitus (HCC)    insulin dependent   Past Surgical History:  Procedure Laterality Date  . BIOPSY  09/19/2018   Procedure: BIOPSY;  Surgeon: Thornton Park, MD;  Location: [facility];  Service: Gastroenterology;;  . CARDIAC CATHETERIZATION N/A 07/16/2014   Procedure: Left Heart Cath  and Coronary Angiography;  Surgeon: Adrian Prows, MD;  Location: [place] [facility];  Service: Cardiovascular;  Laterality: N/A;  . CARDIAC CATHETERIZATION  07/16/2014   Procedure: Coronary Balloon Angioplasty;  Surgeon: Adrian Prows, MD;  Location: [place] [facility];  Service: Cardiovascular;;  . CARDIAC CATHETERIZATION  2003  . CARDIAC DEFIBRILLATOR PLACEMENT  2016  . CATARACT EXTRACTION W/ INTRAOCULAR LENS IMPLANT  Left 03/2014  . COLONOSCOPY    . COLONOSCOPY WITH PROPOFOL N/A 09/19/2018   Procedure: COLONOSCOPY WITH PROPOFOL;  Surgeon: Thornton Park, MD;  [facility]: [facility];  Service: Gastroenterology;  Laterality: N/A;  . CORONARY ANGIOPLASTY WITH STENT PLACEMENT     "I've got a total of 8 stents in there; mostly doine at [facility]" (08/02/2017)  . CORONARY ARTERY BYPASS GRAFT  ~ 2003   "CABG X2"; [facility]  . CORONARY ATHERECTOMY N/A 08/16/2017   Procedure: CORONARY ATHERECTOMY;  Surgeon: Nigel Mormon, MD;  Location: [place] [facility];  Service: Cardiovascular;  Laterality: N/A;  . CORONARY BALLOON ANGIOPLASTY N/A 08/04/2017   Procedure: CORONARY BALLOON ANGIOPLASTY;  Surgeon: Adrian Prows, MD;  Location: [place] [facility];  Service: Cardiovascular;  Laterality: N/A;  . CORONARY BALLOON ANGIOPLASTY N/A 01/11/2019   Procedure: CORONARY BALLOON ANGIOPLASTY;  Surgeon: Adrian Prows, MD;  Location: [place] [facility];  Service: Cardiovascular;  Laterality: N/A;  . CORONARY BALLOON ANGIOPLASTY N/A 10/09/2019   Procedure: CORONARY BALLOON ANGIOPLASTY;  Surgeon: Adrian Prows, MD;  Location: [place] [facility];  Service: Cardiovascular;  Laterality: N/A;  . CORONARY STENT INTERVENTION N/A 08/16/2017   Procedure: CORONARY STENT INTERVENTION;  Surgeon: Nigel Mormon, MD;  Location: [place] [facility];  Service: Cardiovascular;  Laterality: N/A;  . CORONARY STENT INTERVENTION N/A 10/09/2019   Procedure: CORONARY STENT INTERVENTION;  Surgeon: Adrian Prows, MD;  Location: [place] [facility];  Service: Cardiovascular;  Laterality: N/A;  . ELBOW SURGERY Left ?2001   "pinched nerve"  . ENDOSCOPIC MUCOSAL RESECTION  12/18/2018   Procedure: ENDOSCOPIC MUCOSAL RESECTION;  Surgeon: Rush Landmark Telford Nab., MD;  Location: [place];  Service: Gastroenterology;;  . Otho Darner SIGMOIDOSCOPY N/A 12/18/2018   Procedure: Beryle Quant;  Surgeon: Irving Copas., MD;  Location: [place];   Service: Gastroenterology;  Laterality: N/A;  . HEMOSTASIS CLIP PLACEMENT  12/18/2018   Procedure: HEMOSTASIS CLIP PLACEMENT;  Surgeon: Irving Copas., MD;  Location: [place];  Service: Gastroenterology;;  . LEFT HEART CATH AND CORONARY ANGIOGRAPHY N/A 01/11/2019   Procedure: LEFT HEART CATH AND CORONARY ANGIOGRAPHY;  Surgeon: Adrian Prows, MD;  Location: [place] [facility];  Service: Cardiovascular;  Laterality: N/A;  . LEFT HEART CATH AND CORS/GRAFTS ANGIOGRAPHY N/A 08/04/2017   Procedure: LEFT HEART CATH AND CORS/GRAFTS ANGIOGRAPHY;  Surgeon: Adrian Prows, MD;  Location: [place] [facility];  Service: Cardiovascular;  Laterality: N/A;  . LEFT HEART CATH AND CORS/GRAFTS ANGIOGRAPHY N/A 08/20/2017   Procedure: LEFT HEART CATH AND CORS/GRAFTS ANGIOGRAPHY;  Surgeon: Nigel Mormon, MD;  Location: [place] [facility];  Service: Cardiovascular;  Laterality: N/A;  . LEFT HEART CATH AND CORS/GRAFTS ANGIOGRAPHY N/A 01/08/2019   Procedure: LEFT HEART CATH AND CORS/GRAFTS ANGIOGRAPHY;  Surgeon: Wellington Hampshire, MD;  Location: [place] [facility];  Service: Cardiovascular;  Laterality: N/A;  . LEFT HEART CATH AND CORS/GRAFTS ANGIOGRAPHY N/A 10/09/2019   Procedure: LEFT HEART CATH AND CORS/GRAFTS ANGIOGRAPHY;  Surgeon: Adrian Prows, MD;  Location: [place] [facility];  Service: Cardiovascular;  Laterality: N/A;  . LEFT HEART  CATH AND CORS/GRAFTS ANGIOGRAPHY N/A 12/11/2019   Procedure: LEFT HEART CATH AND CORS/GRAFTS ANGIOGRAPHY;  Surgeon: Nigel Mormon, MD;  Location: [place] [facility];  Service: Cardiovascular;  Laterality: N/A;  . POLYPECTOMY  09/19/2018   Procedure: POLYPECTOMY;  Surgeon: Thornton Park, MD;  [facility];  Service: Gastroenterology;;  . Lia Foyer INJECTION  09/19/2018   Procedure: SUBMUCOSAL INJECTION;  Surgeon: Thornton Park, MD;  [facility];  Service: Gastroenterology;;  . Lia Foyer LIFTING INJECTION  12/18/2018   Procedure: SUBMUCOSAL  LIFTING INJECTION;  Surgeon: Irving Copas., MD;  Location: [facility] ENDOSCOPY;  Service: Gastroenterology;;    Family History  Problem Relation Age of Onset  . Uterine cancer Mother   . Lung cancer Mother   . Hyperlipidemia Father   . Heart disease Father   . Hypertension Father   . Diabetes Father   . Colon cancer Neg Hx    Social History   Tobacco Use  . Smoking status: Former Smoker    Packs/day: 0.25    Years: 27.00    Pack years: 6.75    Types: Cigars    Quit date: 2002    Years since quitting: 20.3  . Smokeless tobacco: Former Systems developer    Types: Chew    Quit date: 2002  Substance Use Topics  . Alcohol use: Not Currently    Marital Status: Married ROS  Review of Systems  Cardiovascular: Positive for dyspnea on exertion (very mld). Negative for chest pain, claudication, leg swelling, orthopnea, palpitations, paroxysmal nocturnal dyspnea and syncope.  Respiratory: Negative for shortness of breath.   Hematologic/Lymphatic: Does not bruise/bleed easily.  Gastrointestinal: Negative for melena.  Neurological: Negative for dizziness.   Objective  Blood pressure 117/74, pulse 77, temperature 98.1 F (36.7 C), temperature source Temporal, resp. rate 16, height '5\' 7"'  (1.702 m), weight 219 lb (99.3 kg), SpO2 98 %.  Vitals with BMI 05/19/2020 04/22/2020 04/22/2020  Height '5\' 7"'  - '5\' 7"'   Weight 219 lbs - 220 lbs  BMI 73.42 - 87.68  Systolic 115 726 203  Diastolic 74 89 88  Pulse 77 64 64     Physical Exam Vitals reviewed.  Constitutional:      Appearance: He is obese.     Comments: He is moderately built and mildly obese in no acute distress.  Cardiovascular:     Rate and Rhythm: Normal rate and regular rhythm.     Pulses:          Carotid pulses are 1+ on the right side with bruit and 1+ on the left side with bruit.      Femoral pulses are 1+ on the right side and 1+ on the left side.      Popliteal pulses are 1+ on the right side and 1+ on the left side.        Dorsalis pedis pulses are 0 on the right side and 1+ on the left side.       Posterior tibial pulses are 1+ on the right side and 0 on the left side.     Heart sounds: Normal heart sounds.     Comments: No JVD.  1+ pitting edema above ankle bilateral  Pulmonary:     Effort: Pulmonary effort is normal. No accessory muscle usage or respiratory distress.     Breath sounds: Normal breath sounds.  Neurological:     Mental Status: He is alert.    Laboratory examination:   Recent Labs    10/23/19 1347 11/14/19  1514 12/06/19 1431 12/09/19 0252 12/10/19 0218 12/11/19 0414 12/12/19 0337 05/12/20 1519  NA 136 138 141   < > 133* 136 135  --   K 4.6 4.8 5.1   < > 3.9 3.9 4.2  --   CL 100 102 103   < > 100 102 101  --   CO2 '20 21 24   ' < > '25 25 28  ' --   GLUCOSE 356* 362* 377*   < > 283* 102* 106*  --   BUN 28* 24 17   < > 27* 32* 31*  --   CREATININE 1.32* 1.43* 1.30*   < > 1.66* 1.57* 1.45* 1.20  CALCIUM 9.4 9.3 8.9   < > 8.6* 8.8* 8.9  --   GFRNONAA 57* 52* 58*   < > 46* 49* 54*  --   GFRAA 66 60 67  --   --   --   --   --    < > = values in this interval not displayed.   estimated creatinine clearance is 69.8 mL/min (by C-G formula based on SCr of 1.2 mg/dL).  CMP Latest Ref Rng & Units 05/12/2020 12/12/2019 12/11/2019  Glucose 70 - 99 mg/dL - 106(H) 102(H)  BUN 8 - 23 mg/dL - 31(H) 32(H)  Creatinine 0.61 - 1.24 mg/dL 1.20 1.45(H) 1.57(H)  Sodium 135 - 145 mmol/L - 135 136  Potassium 3.5 - 5.1 mmol/L - 4.2 3.9  Chloride 98 - 111 mmol/L - 101 102  CO2 22 - 32 mmol/L - 28 25  Calcium 8.9 - 10.3 mg/dL - 8.9 8.8(L)  Total Protein 6.5 - 8.1 g/dL - - -  Total Bilirubin 0.3 - 1.2 mg/dL - - -  Alkaline Phos 38 - 126 U/L - - -  AST 15 - 41 U/L - - -  ALT 0 - 44 U/L - - -   CBC Latest Ref Rng & Units 12/12/2019 12/11/2019 12/10/2019  WBC 4.0 - 10.5 K/uL 6.8 6.9 11.2(H)  Hemoglobin 13.0 - 17.0 g/dL 10.3(L) 10.1(L) 11.2(L)  Hematocrit 39.0 - 52.0 % 32.4(L) 31.5(L) 35.3(L)  Platelets  150 - 400 K/uL 195 180 176   Lipid Panel No results for input(s): CHOL, TRIG, LDLCALC, VLDL, HDL, CHOLHDL, LDLDIRECT in the last 8760 hours.  HEMOGLOBIN A1C Lab Results  Component Value Date   HGBA1C 10.8 (H) 12/09/2019   MPG 263.26 12/09/2019   TSH Recent Labs    10/09/19 0559  TSH 1.422   BNP    Component Value Date/Time   BNP 910.0 (H) 12/09/2019 0243   External labs:    Labs 01/10/2021:  Hemoglobin 12.4/hematocrit 38.2.  Platelets 198.  Serum creatinine 1.62, BUN 34, sodium 134, potassium 4.5, EGFR 43 mL.  CMP otherwise normal.  A1c 10.6%.  TSH normal.  Total cholesterol 129, triglycerides 140, HDL 46, LDL 55.  Medications and allergies   Allergies  Allergen Reactions  . Diltiazem Rash    Current Outpatient Medications on File Prior to Visit  Medication Sig Dispense Refill  . amiodarone (PACERONE) 200 MG tablet Take 1 tablet (200 mg total) by mouth daily.    [name] Kitchen aspirin EC 81 MG EC tablet Take 1 tablet (81 mg total) by mouth daily.    . carvedilol (COREG) 12.5 MG tablet Take 2 tablets (25 mg total) by mouth 2 (two) times daily. 180 tablet 3  . Cholecalciferol (VITAMIN D3) 50 MCG (2000 UT) TABS Take 2,000 Units by mouth 2 (two) times daily.     [name] Kitchen  Cyanocobalamin 2500 MCG CHEW Chew 2,500 mcg by mouth daily.     . insulin aspart protamine- aspart (NOVOLOG MIX 70/30) (70-30) 100 UNIT/ML injection Inject 0.66 mLs (66 Units total) into the skin daily with breakfast AND 0.6 mLs (60 Units total) daily with supper. (Patient taking differently: Inject 66 units into the skin in the morning with breakfast and 60 units with supper) 10 mL 11  . metFORMIN (GLUCOPHAGE-XR) 500 MG 24 hr tablet Take 1,000 mg by mouth 2 (two) times daily.    . nitroGLYCERIN (NITROSTAT) 0.4 MG SL tablet Place 1 tablet (0.4 mg total) under the tongue every 5 (five) minutes x 3 doses as needed for chest pain. 25 tablet 3  . Omega-3 Fatty Acids (FISH OIL) 1000 MG CAPS Take 2,000 mg by mouth 3 (three)  times daily.     . OXYGEN Inhale 2 L/min into the lungs See admin instructions. 2 L/min at bedtime in conjunction with CPAP    . potassium chloride SA (KLOR-CON) 20 MEQ tablet Take 1 tablet (20 mEq total) by mouth 2 (two) times daily. 180 tablet 3  . PRESCRIPTION MEDICATION Inhale into the lungs See admin instructions. CPAP- At bedtime    . rosuvastatin (CRESTOR) 40 MG tablet Take 1 tablet (40 mg total) by mouth at bedtime.    . sacubitril-valsartan (ENTRESTO) 97-103 MG Take 1 tablet by mouth 2 (two) times daily. 60 tablet   . Semaglutide, 1 MG/DOSE, 2 MG/1.5ML SOPN Inject 2 mg into the skin daily.    . sertraline (ZOLOFT) 100 MG tablet Take 50 mg by mouth daily.    . ticagrelor (BRILINTA) 90 MG TABS tablet Take 1 tablet (90 mg total) by mouth 2 (two) times daily. 60 tablet   . torsemide (DEMADEX) 20 MG tablet Take 1 tablet (20 mg total) by mouth daily as needed (for an overnight weight gain of 3 pounds or more). 90 tablet 3   No current facility-administered medications on file prior to visit.    Radiology:   Chest X-Ray 10/09/2019:  FINDINGS: Unchanged cardiomegaly. Worsened bilateral interstitial opacities. Remote median sternotomy with unchanged position of left chest wall 3 lead AICD. IMPRESSION: Cardiomegaly and worsened bilateral interstitial opacities likely indicating pulmonary edema.  Cardiac Studies:   Renal artery duplex 09/02/2017: Right: Abnormal right Resistive Index. No evidence of right renal  artery stenosis. Left: Normal left Resistive Index. No evidence of left renal artery stenosis. Mesenteric: Normal Celiac artery findings. 70 to 99% stenosis in the superior mesenteric artery.  Left Heart Catheterization12/14/2020:  1. Severe underlying three-vessel coronary artery disease with patent LIMA to LAD. Chronically occluded SVG to OM. Severe in-stent restenosis in left main stent extending into the proximal left circumflex. In addition, there is significant restenosis  in the stent placed in the left posterior AV groove artery. The RCA is known to be small in size and severely diseased. 2. Left ventricular angiography was not performed due to chronic kidney disease. LVEDP was 13 mmHg.  Coronary angioplasty of the left main and mid circumflex coronary artery 01/11/2019: Successful Wolverine cutting balloon angioplasty of the left main, 3.5 x 10 mm balloon utilized and high-pressure 12 atmospheric pressure inflations performed throughout the in-stent restenotic left main and proximal ostial circumflex and also mid circumflex coronary artery, 99% reduced to 0% with TIMI II to TIMI-3 flow. 60 mill contrast utilized.  Echocardiogram 12/18/2019:  Poor eco window and wall motion with reduced sensitivity.  Left ventricle cavity is mildly dilated. Dilated cardiomyopathy. Hypokinetic global wall  motion. Abnormal septal wall motion due to post-operative coronary artery bypass graft. Indeterminate diastolic filling pattern due to paced rhythm. Moderately depressed LV systolic function with visual EF 30-35%.  Calculated EF 37%. Left atrial cavity is mildly dilated at 4.2 cm. Right ventricle cavity is normal in size. Normal right ventricular function. RV Pacemaker wires visualized. No significant change since 06/15/2019. EF previously 35-40%.   Left Heart Catheterization 10/09/2019:  LV: Normal LVEDP.  There was no pressure gradient across the aortic valve.  Angiogram not performed to conserve contrast. Right coronary artery nondominant and severely diffusely diseased unchanged from prior cardiac catheterization. Left main: Mid to distal left main has in-stent restenosis of 99% and extends into the dominant circumflex which also has proximal in-stent 99% stenosis with TIMI II flow.  Successful scoring balloon angioplasty with 3.5 x 15 mm Wolverine at high 14 atmospheric pressure, due to recall, haziness, stented with 4.0 x 18 mm resolute Onyx DES at 18 atmospheric pressure  for 60 seconds, stenosis reduced in the left main and proximal in-stent restenosis from 99% to 0% with improvement in TIMI flow from 2-3. Successful PTCA and Cutting Balloon angioplasty with Wolverine 3.0 x 10 mm balloon in the mid segment of the dominant circumflex coronary artery in-stent restenosis.  Stenosis reduced to 0% at 14 atmospheric pressure inflations x2.  TIMI-3 to TIMI-3 flow maintained. LAD: Is occluded in the proximal segment.  Distal LAD supplied by LIMA.  LIMA to LAD is widely patent.  Left Heart Catheterization 12/11/2019:   LM: Protected. Patent LM/LCx stent LAD: Prox 100% occluded. Bypassed by patent LIMA attaching in mid LAD        Mid to distal LAD 50% disease LCxL: Patent LM/LCx stent. OM3 60% stenosis RCA: Small caliber with severe diffuse disease         Dampening of the pressures noted on engagement  No change in coronary anatomy compared to prior cath in 09/2019 Current presentation likely due to acute on chronic HFrEF  Device check: Medtronic CRTD Amplia MRI QWuad CRT-D 10/29/14   Scheduled  In office ICD 11/22/19  Single (S)/Dual (D)/BV (M) BV Presenting APBP Pacer dependant: No. Underlying NSR @ 55/min. AP 48%, BP 98.4% AMS Episodes 1.  SVT 11 S. HVR 0 since prior record 11/22/2019.   Longevity 2.7 Years/Voltage. Charge time 9.8Sec. Lead measurements: Stable Histogram: Low (L)/normal (N)/high (H)  Normal Patient activity Low. Thoracic impedance: Fluid accumulation last one month now back to baseline  Observations: Normal BV ICD.  Changes: Changed AP rate to 50 from 60 for preserving battery. Patient is programmed to [place] pace 100%   Scheduled remote ICD transmission 04/24/2020: Longevity 29 months.  RA pacing 15%, BiV pacing 97%.  No mode switches.  1 nonsustained VT, no therapy.  Impedance monitoring does not reveal heart failure.  EKG:      EKG 04/04/2020: AV paced rhythm.  No further analysis.   No significant change from 10/17/2019: AV paced rhythm  at rate of 75 bpm. No further analysis.   Assessment   No diagnosis found.  No orders of the defined types were placed in this encounter.  There are no discontinued medications. Recommendations:   Kemar Pandit  is a 65 y.o. Caucasian male with CAD and history of CABG x2 in 2002  with only LIMA to LAD being patent, Circumflex coronary artery is super dominant. History of stenting to circumflex/OM bifurcation due to recurrent restenosis in 2015, left main and circumflex proximal and midsegment in 2016, angioplasty  to left main and proximal and mid circumflex in 2020 and again 10/08/2019. He presented with acute pulmonary edema again on 12/11/2019 and repeat cardiac catheterization essentially revealed widely patent circumflex stent.  Past medical history significant for ischemic cardiomyopathy with severe LV systolic dysfunction, recurrent sustained VT SP ICD implantation, hypertension, mixed hyperlipidemia, uncontrolled diabetes mellitus with stage III chronic kidney disease.  On his last office visit there were multiple medication changes done by the [facility] hospital and since then he had noticed low blood pressure, he now presents for 6-week follow-up.  Since making changes to his cardiac medications and discontinuing Imdur, blood pressure has been stable, he remains asymptomatic without any leg edema, dyspnea on exertion or PND or orthopnea or chest pain.  I refilled his nitroglycerin.  Wife present at the bedside and again reiterated regarding weight loss and continued exercise.  He has high-grade stenosis of the carotid arteries, he is scheduled to see Dr. Carlis Abbott tomorrow for evaluation from surgical standpoint.  Otherwise stable from cardiac standpoint, I will see him back in 3 months for follow-up and close monitoring.  He was evaluated at [facility] on 03/27/2020, Jardiance and Victoza was discontinued and patient was started on semaglutide (Ozempic). Suspect his low blood pressure is related to  this change, will discontinue isosorbide mononitrate as he is not having any active anginal symptoms.  Continue the beta-blocker at present dose.  Continue Entresto. ICD does not reveal recent fluid overload.  I reviewed the results of the carotid artery duplex.  He has developed high-grade bilateral carotid artery stenosis, will refer him to be evaluated by vascular surgery.  Otherwise lipids are at goal, I reviewed his external labs, renal function is also remained stable.  I would like to see him back in 6 weeks for follow-up.  Extremely complex patient with multiple medications, multiple medical issues.  I spent 40 minutes with coordination of care.    Adrian Prows, PA-C 05/19/2020, 10:09 AM Office: [phone]

## 2020-05-20 ENCOUNTER — Other Ambulatory Visit: Payer: Self-pay

## 2020-05-20 ENCOUNTER — Encounter: Payer: Self-pay | Admitting: Vascular Surgery

## 2020-05-20 ENCOUNTER — Ambulatory Visit (INDEPENDENT_AMBULATORY_CARE_PROVIDER_SITE_OTHER): Payer: No Typology Code available for payment source | Admitting: Vascular Surgery

## 2020-05-20 VITALS — BP 159/91 | HR 68 | Temp 97.4°F | Resp 18 | Ht 67.0 in | Wt 218.0 lb

## 2020-05-20 DIAGNOSIS — I6523 Occlusion and stenosis of bilateral carotid arteries: Secondary | ICD-10-CM

## 2020-05-20 NOTE — Progress Notes (Signed)
Patient name: Martin Bush MRN: [id number] DOB: May 27, 1955 Sex: male  REASON FOR CONSULT: F/U after CTA to evaluate bilateral high-grade carotid artery stenosis  HPI: Martin Bush is a 65 y.o. male, with history of diabetes, coronary artery disease status post CABG as well as previous MI with ischemic cardiomyopathy and severe LV dysfunction, multiple previous PCI's, hypertension, hyperlipidemia that presents for follow-up to discuss CTA neck for evaluation of bilateral carotid stenosis.  As previously noted, he denies any previous history of TIA or stroke.  He is being followed by Dr. Einar Gip who is his cardiologist.  Most recent ultrasound on 03/23/2020 showed high-grade bilateral ICA stenosis.  On the right he had a velocity of 404/132 greater than 80% stenosis and on the left a velocity of 405/184 with greater than 80% stenosis.  He is on aspirin and Brilinta.  His wife state that he had 3 heart attacks at the end of last year.  No previous neck surgery or neck radiation. He is on Brilinta.  Very sedentary at home.  CTA neck on 05/12/2020 did confirm 80% stenosis of bilateral internal carotid arteries.  He also has a chronic occlusion of the left vertebral artery.  Reports no neurologic events since I last saw him  Past Medical History:  Diagnosis Date  . AICD (automatic cardioverter/defibrillator) present    Medtronic  . Arthritis    "thumbs"  (08/02/2017)  . CHF (congestive heart failure) ([place])   . Chronic combined systolic and diastolic heart failure ([place])    a. 08/2017 Echo: EF 20-25%, mod glob HK. Sev distal ant sept, inflat HK. Apical AK. Gr2 DD. Mildly reduced RV fxn.  . Colon polyps   . Coronary artery disease    a. s/p CABG x 2 (LIMA->LAD, VG->OM); b. Multiple PCI's to LM/LCX/OM; c. 07/2017 PTCA of LM/LCX w/ early ISR-->repeat PCI/DES to LM (3.5x20 Synergy DES) and LCX (3.0x20 Synergy DES); d. 07/2018 Relook Cath: LM patent stent, LAD 100ost, LCX patent stent, OM1 99/60,  LIMA->LAD ok. VG->dLCX 86 (old).  . Depression   . Encounter for assessment of implantable cardioverter-defibrillator (ICD) 09/27/2018  . High cholesterol   . Hypertension   . ICD; Biventricular  Medtronic ICD Amplia MRI QWuad CRT-D  in situ 10/29/14 10/29/2014   Remote ICD check 09.23.20:  One 6 beat NSVT. No therapy.  1 SVT episode @ 130 bpm (38 Sec).  There were 23 Vent sense episodes detected for up to 1.1 min/day (AT). Health trends (patient activity, heart rate variability, average heart rates) are stable.Trans-thoracic impedance trends and the OptiVol Fluid Index do no present significant abnormalities. Battery longevity is 4.3 years. RA pacing is 3  . Ischemic cardiomyopathy 09/27/2018  . MI (myocardial infarction) ([place]) 2003  . NSTEMI (non-ST elevated myocardial infarction) ([place]) 07/16/2014  . OSA on CPAP   . Oxygen deficiency   . Pneumonia 10/2014  . Proteinuria   . Sleep apnea   . Type II diabetes mellitus (H[facility])    insulin dependent    Past Surgical History:  Procedure Laterality Date  . BIOPSY  09/19/2018   Procedure: BIOPSY;  Surgeon: Thornton Park, MD;  Location: [facility];  Service: Gastroenterology;;  . CARDIAC CATHETERIZATION N/A 07/16/2014   Procedure: Left Heart Cath and Coronary Angiography;  Surgeon: Adrian Prows, MD;  Location: [place] [facility] LAB;  Service: Cardiovascular;  Laterality: N/A;  . CARDIAC CATHETERIZATION  07/16/2014   Procedure: Coronary Balloon Angioplasty;  Surgeon: Adrian Prows, MD;  Location: [facility]  LAB;  Service: Cardiovascular;;  . CARDIAC CATHETERIZATION  2003  . CARDIAC DEFIBRILLATOR PLACEMENT  2016  . CATARACT EXTRACTION W/ INTRAOCULAR LENS IMPLANT Left 03/2014  . COLONOSCOPY    . COLONOSCOPY WITH PROPOFOL N/A 09/19/2018   Procedure: COLONOSCOPY WITH PROPOFOL;  Surgeon: Thornton Park, MD;  Location: [facility];  Service: Gastroenterology;  Laterality: N/A;  . CORONARY ANGIOPLASTY WITH STENT PLACEMENT     "I've got a total of 8 stents  in there; mostly doine at [facility]" (08/02/2017)  . CORONARY ARTERY BYPASS GRAFT  ~ 2003   "CABG X2"; [facility]  . CORONARY ATHERECTOMY N/A 08/16/2017   Procedure: CORONARY ATHERECTOMY;  Surgeon: Nigel Mormon, MD;  Location: [place] CV LAB;  Service: Cardiovascular;  Laterality: N/A;  . CORONARY BALLOON ANGIOPLASTY N/A 08/04/2017   Procedure: CORONARY BALLOON ANGIOPLASTY;  Surgeon: Adrian Prows, MD;  Location: [place] CV LAB;  Service: Cardiovascular;  Laterality: N/A;  . CORONARY BALLOON ANGIOPLASTY N/A 01/11/2019   Procedure: CORONARY BALLOON ANGIOPLASTY;  Surgeon: Adrian Prows, MD;  Location: [place] CV LAB;  Service: Cardiovascular;  Laterality: N/A;  . CORONARY BALLOON ANGIOPLASTY N/A 10/09/2019   Procedure: CORONARY BALLOON ANGIOPLASTY;  Surgeon: Adrian Prows, MD;  Location: [place] CV LAB;  Service: Cardiovascular;  Laterality: N/A;  . CORONARY STENT INTERVENTION N/A 08/16/2017   Procedure: CORONARY STENT INTERVENTION;  Surgeon: Nigel Mormon, MD;  Location: [place] CV LAB;  Service: Cardiovascular;  Laterality: N/A;  . CORONARY STENT INTERVENTION N/A 10/09/2019   Procedure: CORONARY STENT INTERVENTION;  Surgeon: Adrian Prows, MD;  Location: [place] CV LAB;  Service: Cardiovascular;  Laterality: N/A;  . ELBOW SURGERY Left ?2001   "pinched nerve"  . ENDOSCOPIC MUCOSAL RESECTION  12/18/2018   Procedure: ENDOSCOPIC MUCOSAL RESECTION;  Surgeon: Rush Landmark Telford Nab., MD;  Location: [place];  Service: Gastroenterology;;  . Otho Darner SIGMOIDOSCOPY N/A 12/18/2018   Procedure: Beryle Quant;  Surgeon: Irving Copas., MD;  Location: [place];  Service: Gastroenterology;  Laterality: N/A;  . HEMOSTASIS CLIP PLACEMENT  12/18/2018   Procedure: HEMOSTASIS CLIP PLACEMENT;  Surgeon: Irving Copas., MD;  Location: [place];  Service: Gastroenterology;;  . LEFT HEART CATH AND CORONARY ANGIOGRAPHY N/A 01/11/2019   Procedure: LEFT  HEART CATH AND CORONARY ANGIOGRAPHY;  Surgeon: Adrian Prows, MD;  Location: [place] CV LAB;  Service: Cardiovascular;  Laterality: N/A;  . LEFT HEART CATH AND CORS/GRAFTS ANGIOGRAPHY N/A 08/04/2017   Procedure: LEFT HEART CATH AND CORS/GRAFTS ANGIOGRAPHY;  Surgeon: Adrian Prows, MD;  Location: [place] CV LAB;  Service: Cardiovascular;  Laterality: N/A;  . LEFT HEART CATH AND CORS/GRAFTS ANGIOGRAPHY N/A 08/20/2017   Procedure: LEFT HEART CATH AND CORS/GRAFTS ANGIOGRAPHY;  Surgeon: Nigel Mormon, MD;  Location: [place] CV LAB;  Service: Cardiovascular;  Laterality: N/A;  . LEFT HEART CATH AND CORS/GRAFTS ANGIOGRAPHY N/A 01/08/2019   Procedure: LEFT HEART CATH AND CORS/GRAFTS ANGIOGRAPHY;  Surgeon: [place] Hampshire, MD;  Location: [place] CV LAB;  Service: Cardiovascular;  Laterality: N/A;  . LEFT HEART CATH AND CORS/GRAFTS ANGIOGRAPHY N/A 10/09/2019   Procedure: LEFT HEART CATH AND CORS/GRAFTS ANGIOGRAPHY;  Surgeon: Adrian Prows, MD;  Location: [place] CV LAB;  Service: Cardiovascular;  Laterality: N/A;  . LEFT HEART CATH AND CORS/GRAFTS ANGIOGRAPHY N/A 12/11/2019   Procedure: LEFT HEART CATH AND CORS/GRAFTS ANGIOGRAPHY;  Surgeon: Nigel Mormon, MD;  Location: [place] CV LAB;  Service: Cardiovascular;  Laterality: N/A;  . POLYPECTOMY  09/19/2018   Procedure: POLYPECTOMY;  Surgeon: Thornton Park, MD;  Location: [facility];  Service: Gastroenterology;;  . Lia Foyer INJECTION  09/19/2018   Procedure: SUBMUCOSAL INJECTION;  Surgeon: Thornton Park, MD;  Location: [facility];  Service: Gastroenterology;;  . Maryagnes Amos INJECTION  12/18/2018   Procedure: SUBMUCOSAL LIFTING INJECTION;  Surgeon: Irving Copas., MD;  Location: [facility] ENDOSCOPY;  Service: Gastroenterology;;    Family History  Problem Relation Age of Onset  . Uterine cancer Mother   . Lung cancer Mother   . Hyperlipidemia Father   . Heart disease Father   . Hypertension Father   .  Diabetes Father   . Colon cancer Neg Hx     SOCIAL HISTORY: Social History   Socioeconomic History  . Marital status: Married    Spouse name: Not on file  . Number of children: 3  . Years of education: Not on file  . Highest education level: Not on file  Occupational History  . Not on file  Tobacco Use  . Smoking status: Former Smoker    Packs/day: 0.25    Years: 27.00    Pack years: 6.75    Types: Cigars    Quit date: 2002    Years since quitting: 20.3  . Smokeless tobacco: Former Systems developer    Types: [place] date: 2002  Media planner  . Vaping Use: Never used  Substance and Sexual Activity  . Alcohol use: Not Currently  . Drug use: Never  . Sexual activity: Not Currently  Other Topics Concern  . Not on file  Social History Narrative  . Not on file   Social Determinants of Health   Financial Resource Strain: Low Risk   . Difficulty of Paying Living Expenses: Not hard at all  Food Insecurity: No Food Insecurity  . Worried About Charity fundraiser in the Last Year: Never true  . Ran Out of Food in the Last Year: Never true  Transportation Needs: No Transportation Needs  . Lack of Transportation (Medical): No  . Lack of Transportation (Non-Medical): No  Physical Activity: Not on file  Stress: No Stress Concern Present  . Feeling of Stress : Not at all  Social Connections: Not on file  Intimate Partner Violence: Not At Risk  . Fear of Current or Ex-Partner: No  . Emotionally Abused: No  . Physically Abused: No  . Sexually Abused: No    Allergies  Allergen Reactions  . Diltiazem Rash    Current Outpatient Medications  Medication Sig Dispense Refill  . amiodarone (PACERONE) 200 MG tablet Take 1 tablet (200 mg total) by mouth daily.    [name] Kitchen aspirin EC 81 MG EC tablet Take 1 tablet (81 mg total) by mouth daily.    . carvedilol (COREG) 12.5 MG tablet Take 2 tablets (25 mg total) by mouth 2 (two) times daily. 180 tablet 3  . Cholecalciferol (VITAMIN D3) 50 MCG  (2000 UT) TABS Take 2,000 Units by mouth 2 (two) times daily.     . Cyanocobalamin 2500 MCG CHEW Chew 2,500 mcg by mouth daily.     . insulin aspart protamine- aspart (NOVOLOG MIX 70/30) (70-30) 100 UNIT/ML injection Inject 0.66 mLs (66 Units total) into the skin daily with breakfast AND 0.6 mLs (60 Units total) daily with supper. (Patient taking differently: Inject 66 units into the skin in the morning with breakfast and 60 units with supper) 10 mL 11  . metFORMIN (GLUCOPHAGE-XR) 500 MG 24 hr tablet Take 1,000 mg by mouth 2 (two)  times daily.    . nitroGLYCERIN (NITROSTAT) 0.4 MG SL tablet Place 1 tablet (0.4 mg total) under the tongue every 5 (five) minutes x 3 doses as needed for chest pain. 25 tablet 3  . Omega-3 Fatty Acids (FISH OIL) 1000 MG CAPS Take 2,000 mg by mouth 3 (three) times daily.     . OXYGEN Inhale 2 L/min into the lungs See admin instructions. 2 L/min at bedtime in conjunction with CPAP    . potassium chloride SA (KLOR-CON) 20 MEQ tablet Take 1 tablet (20 mEq total) by mouth 2 (two) times daily. 180 tablet 3  . PRESCRIPTION MEDICATION Inhale into the lungs See admin instructions. CPAP- At bedtime    . rosuvastatin (CRESTOR) 40 MG tablet Take 1 tablet (40 mg total) by mouth at bedtime.    . sacubitril-valsartan (ENTRESTO) 97-103 MG Take 1 tablet by mouth 2 (two) times daily. 60 tablet   . Semaglutide, 1 MG/DOSE, 2 MG/1.5ML SOPN Inject 2 mg into the skin daily.    . sertraline (ZOLOFT) 100 MG tablet Take 50 mg by mouth daily.    . ticagrelor (BRILINTA) 90 MG TABS tablet Take 1 tablet (90 mg total) by mouth 2 (two) times daily. 60 tablet   . torsemide (DEMADEX) 20 MG tablet Take 1 tablet (20 mg total) by mouth daily as needed (for an overnight weight gain of 3 pounds or more). 90 tablet 3   No current facility-administered medications for this visit.    REVIEW OF SYSTEMS:  [X]  denotes positive finding, [ ]  denotes negative finding Cardiac  Comments:  Chest pain or chest  pressure:    Shortness of breath upon exertion:    Short of breath when lying flat:    Irregular heart rhythm:        Vascular    Pain in calf, thigh, or hip brought on by ambulation:    Pain in feet at night that wakes you up from your sleep:     Blood clot in your veins:    Leg swelling:         Pulmonary    Oxygen at home:    Productive cough:     Wheezing:         Neurologic    Sudden weakness in arms or legs:     Sudden numbness in arms or legs:     Sudden onset of difficulty speaking or slurred speech:    Temporary loss of vision in one eye:     Problems with dizziness:         Gastrointestinal    Blood in stool:     Vomited blood:         Genitourinary    Burning when urinating:     Blood in urine:        Psychiatric    Major depression:         Hematologic    Bleeding problems:    Problems with blood clotting too easily:        Skin    Rashes or ulcers:        Constitutional    Fever or chills:      PHYSICAL EXAM: Vitals:   05/20/20 0832 05/20/20 0835  BP: (!) 155/90 (!) 159/91  Pulse: 68 68  Resp: 18   Temp: (!) 97.4 F (36.3 C)   TempSrc: Temporal   SpO2: 96%   Weight: 218 lb (98.9 kg)   Height: 5\' 7"  (1.702 m)  GENERAL: The patient is a well-nourished male, in no acute distress. The vital signs are documented above. CARDIAC: There is a regular rate and rhythm.  VASCULAR:  Palpable radial pulses both upper extremities Palpable femoral pulses bilaterally PULMONARY: No respiratory distress. ABDOMEN: Soft and non-tender. MUSCULOSKELETAL: There are no major deformities or cyanosis. NEUROLOGIC: No focal weakness or paresthesias are detected.  Cranial nerves II through XII grossly intact   DATA:   Carotid duplex 03/18/2020 shows bilateral high-grade stenosis greater than 80%  CTA neck shows bilateral high-grade carotid artery stenosis in the ICA.  Assessment/Plan:  65 year old male presents for evaluation of asymptomatic bilateral  high-grade carotid artery ICA stenosis >80%.  I sent him for CTA neck after initial evaluation to evaluate for TCAR given bilateral high-grade stenosis with severe cardiac history.  The CTA neck did confirm greater than 80% bilateral ICA stenosis.  In review of the CTA, I think the left ICA has a higher-grade stenosis and he also had a slightly higher velocity in the left ICA on recent duplex.  I have recommended a left TCAR and we discussed plans for Monday 05/26/20.  I discussed that he needs to stay on aspirin, Crestor, and Brilinta.  We discussed risk and benefits including 1% risk of perioperative stroke in addition to risk of heart attack, risk of anesthesia, bledding, etc.  I have discussed with Dr. Einar Gip his cardiologist who feels he does not need any further cardiac evaluation at this time.   [name], MD [facility] Office: [phone]

## 2020-05-22 NOTE — Progress Notes (Signed)
Surgical Instructions    Your procedure is scheduled on 05/26/20.  Report to [facility] Main Entrance "A" at 08:00 A.M., then check in with the Admitting office.  Call this number if you have problems the morning of surgery:  [phone]   If you have any questions prior to your surgery date call [phone]: Open Monday-Friday 8am-4pm    Remember:  Do not eat or drink after midnight the night before your surgery     Take these medicines the morning of surgery with A SIP OF WATER  amiodarone (PACERONE)  carvedilol (COREG)  nitroGLYCERIN (NITROSTAT) if needed for chest pain sertraline (ZOLOFT)  aspirin EC  ticagrelor (BRILINTA)   As of today, STOP taking (unless otherwise instructed by your surgeon) Aleve, Naproxen, Ibuprofen, Motrin, Advil, Goody's, BC's, all herbal medications, fish oil, and all vitamins.  Continue taking your aspirin and ticagrelor (BRILINTA).   WHAT DO I DO ABOUT MY DIABETES MEDICATION?   [name] Kitchen Do not take oral diabetes medicines (pills) the morning of surgery.  . THE NIGHT BEFORE SURGERY, take 42 units of (NOVOLOG MIX 70/30) if blood glucose is over 130.      [name] Kitchen THE MORNING OF SURGERY, do not take (NOVOLOG MIX 70/30). Do not take metFORMIN (GLUCOPHAGE-XR) or Semaglutide.   . The day of surgery, do not take other diabetes injectables, including Byetta (exenatide), Bydureon (exenatide ER), Victoza (liraglutide), or Trulicity (dulaglutide).  . If your CBG is greater than 220 mg/dL, you may take  of your sliding scale (correction) dose of insulin.   HOW TO MANAGE YOUR DIABETES BEFORE AND AFTER SURGERY  Why is it important to control my blood sugar before and after surgery? . Improving blood sugar levels before and after surgery helps healing and can limit problems. . A way of improving blood sugar control is eating a healthy diet by: o  Eating less sugar and carbohydrates o  Increasing activity/exercise o  Talking with your doctor about reaching your  blood sugar goals . High blood sugars (greater than 180 mg/dL) can raise your risk of infections and slow your recovery, so you will need to focus on controlling your diabetes during the weeks before surgery. . Make sure that the doctor who takes care of your diabetes knows about your planned surgery including the date and location.  How do I manage my blood sugar before surgery? . Check your blood sugar at least 4 times a day, starting 2 days before surgery, to make sure that the level is not too high or low. . Check your blood sugar the morning of your surgery when you wake up and every 2 hours until you get to the Short Stay unit. o If your blood sugar is less than 70 mg/dL, you will need to treat for low blood sugar: - Do not take insulin. - Treat a low blood sugar (less than 70 mg/dL) with  cup of clear juice (cranberry or apple), 4 glucose tablets, OR glucose gel. - Recheck blood sugar in 15 minutes after treatment (to make sure it is greater than 70 mg/dL). If your blood sugar is not greater than 70 mg/dL on recheck, call [phone] for further instructions. . Report your blood sugar to the short stay nurse when you get to Short Stay.  . If you are admitted to the hospital after surgery: o Your blood sugar will be checked by the staff and you will probably be given insulin after surgery (instead of oral diabetes medicines) to make sure you have  good blood sugar levels. o The goal for blood sugar control after surgery is 80-180 mg/dL.                      Do not wear jewelry, make up, or nail polish            Do not wear lotions, powders, perfumes/colognes, or deodorant.            Do not shave 48 hours prior to surgery.  Men may shave face and neck.            Do not bring valuables to the hospital.            [facility] is not responsible for any belongings or valuables.  Do NOT Smoke (Tobacco/Vaping) or drink Alcohol 24 hours prior to your procedure If you use a CPAP at night,  you may bring all equipment for your overnight stay.   Contacts, glasses, dentures or bridgework may not be worn into surgery, please bring cases for these belongings   For patients admitted to the hospital, discharge time will be determined by your treatment team.   Patients discharged the day of surgery will not be allowed to drive home, and someone needs to stay with them for 24 hours.    Special instructions:   [facility]- Preparing For Surgery  Before surgery, you can play an important role. Because skin is not sterile, your skin needs to be as free of germs as possible. You can reduce the number of germs on your skin by washing with CHG (chlorahexidine gluconate) Soap before surgery.  CHG is an antiseptic cleaner which kills germs and bonds with the skin to continue killing germs even after washing.    Oral Hygiene is also important to reduce your risk of infection.  Remember - BRUSH YOUR TEETH THE MORNING OF SURGERY WITH YOUR REGULAR TOOTHPASTE  Please do not use if you have an allergy to CHG or antibacterial soaps. If your skin becomes reddened/irritated stop using the CHG.  Do not shave (including legs and underarms) for at least 48 hours prior to first CHG shower. It is OK to shave your face.  Please follow these instructions carefully.   1. Shower the NIGHT BEFORE SURGERY and the MORNING OF SURGERY  2. If you chose to wash your hair, wash your hair first as usual with your normal shampoo.  3. After you shampoo, rinse your hair and body thoroughly to remove the shampoo.  4. Wash Face and genitals (private parts) with your normal soap.   5.  Shower the NIGHT BEFORE SURGERY and the MORNING OF SURGERY with CHG Soap.   6. Use CHG Soap as you would any other liquid soap. You can apply CHG directly to the skin and wash gently with a scrungie or a clean washcloth.   7. Apply the CHG Soap to your body ONLY FROM THE NECK DOWN.  Do not use on open wounds or open sores. Avoid  contact with your eyes, ears, mouth and genitals (private parts). Wash Face and genitals (private parts)  with your normal soap.   8. Wash thoroughly, paying special attention to the area where your surgery will be performed.  9. Thoroughly rinse your body with warm water from the neck down.  10. DO NOT shower/wash with your normal soap after using and rinsing off the CHG Soap.  11. Pat yourself dry with a CLEAN TOWEL.  12. Wear CLEAN PAJAMAS to bed  the night before surgery  13. Place CLEAN SHEETS on your bed the night before your surgery  14. DO NOT SLEEP WITH PETS.   Day of Surgery: Take a shower with CHG soap. Wear Clean/Comfortable clothing the morning of surgery Do not apply any deodorants/lotions.   Remember to brush your teeth WITH YOUR REGULAR TOOTHPASTE.   Please read over the following fact sheets that you were given.

## 2020-05-23 ENCOUNTER — Encounter (HOSPITAL_COMMUNITY)
Admission: RE | Admit: 2020-05-23 | Discharge: 2020-05-23 | Disposition: A | Discharge status: Home / Self Care | Payer: No Typology Code available for payment source | Source: Ambulatory Visit | Attending: Vascular Surgery | Admitting: Vascular Surgery

## 2020-05-23 ENCOUNTER — Encounter (HOSPITAL_COMMUNITY): Payer: Self-pay

## 2020-05-23 ENCOUNTER — Other Ambulatory Visit
Admission: RE | Admit: 2020-05-23 | Discharge: 2020-05-23 | Disposition: A | Discharge status: Home / Self Care | Payer: Medicare Other | Source: Ambulatory Visit | Attending: Vascular Surgery | Admitting: Vascular Surgery

## 2020-05-23 ENCOUNTER — Other Ambulatory Visit: Payer: Self-pay

## 2020-05-23 DIAGNOSIS — I6523 Occlusion and stenosis of bilateral carotid arteries: Secondary | ICD-10-CM | POA: Insufficient documentation

## 2020-05-23 DIAGNOSIS — Z7982 Long term (current) use of aspirin: Secondary | ICD-10-CM | POA: Insufficient documentation

## 2020-05-23 DIAGNOSIS — Z79899 Other long term (current) drug therapy: Secondary | ICD-10-CM | POA: Insufficient documentation

## 2020-05-23 DIAGNOSIS — I2510 Atherosclerotic heart disease of native coronary artery without angina pectoris: Secondary | ICD-10-CM | POA: Insufficient documentation

## 2020-05-23 DIAGNOSIS — Z95810 Presence of automatic (implantable) cardiac defibrillator: Secondary | ICD-10-CM | POA: Insufficient documentation

## 2020-05-23 DIAGNOSIS — Z01812 Encounter for preprocedural laboratory examination: Secondary | ICD-10-CM | POA: Insufficient documentation

## 2020-05-23 DIAGNOSIS — I255 Ischemic cardiomyopathy: Secondary | ICD-10-CM | POA: Insufficient documentation

## 2020-05-23 DIAGNOSIS — Z20822 Contact with and (suspected) exposure to covid-19: Secondary | ICD-10-CM | POA: Insufficient documentation

## 2020-05-23 HISTORY — DX: Personal history of other diseases of the digestive system: Z87.19

## 2020-05-23 HISTORY — DX: Peripheral vascular disease, unspecified: I73.9

## 2020-05-23 LAB — SURGICAL PCR SCREEN
MRSA, PCR: NEGATIVE
Staphylococcus aureus: NEGATIVE

## 2020-05-23 LAB — COMPREHENSIVE METABOLIC PANEL
ALT: 16 U/L (ref 0–44)
AST: 16 U/L (ref 15–41)
Albumin: 3.6 g/dL (ref 3.5–5.0)
Alkaline Phosphatase: 145 U/L — ABNORMAL HIGH (ref 38–126)
Anion gap: 6 (ref 5–15)
BUN: 29 mg/dL — ABNORMAL HIGH (ref 8–23)
CO2: 19 mmol/L — ABNORMAL LOW (ref 22–32)
Calcium: 8.9 mg/dL (ref 8.9–10.3)
Chloride: 109 mmol/L (ref 98–111)
Creatinine, Ser: 1.52 mg/dL — ABNORMAL HIGH (ref 0.61–1.24)
GFR, Estimated: 51 mL/min — ABNORMAL LOW (ref >60)
Glucose, Bld: 248 mg/dL — ABNORMAL HIGH (ref 70–99)
Potassium: 5.4 mmol/L — ABNORMAL HIGH (ref 3.5–5.1)
Sodium: 134 mmol/L — ABNORMAL LOW (ref 135–145)
Total Bilirubin: 0.9 mg/dL (ref 0.3–1.2)
Total Protein: 6.8 g/dL (ref 6.5–8.1)

## 2020-05-23 LAB — URINALYSIS, ROUTINE W REFLEX MICROSCOPIC
Bacteria, UA: NONE SEEN
Bilirubin Urine: NEGATIVE
Glucose, UA: >=500 mg/dL — AB
Ketones, ur: NEGATIVE mg/dL
Leukocytes,Ua: NEGATIVE
Nitrite: NEGATIVE
Protein, ur: 30 mg/dL — AB
Specific Gravity, Urine: 1.015 (ref 1.005–1.030)
pH: 5 (ref 5.0–8.0)

## 2020-05-23 LAB — CBC
HCT: 38.8 % — ABNORMAL LOW (ref 39.0–52.0)
Hemoglobin: 12.6 g/dL — ABNORMAL LOW (ref 13.0–17.0)
MCH: 29.8 pg (ref 26.0–34.0)
MCHC: 32.5 g/dL (ref 30.0–36.0)
MCV: 91.7 fL (ref 80.0–100.0)
Platelets: 175 10*3/uL (ref 150–400)
RBC: 4.23 MIL/uL (ref 4.22–5.81)
RDW: 13.6 % (ref 11.5–15.5)
WBC: 7.4 10*3/uL (ref 4.0–10.5)
nRBC: 0 % (ref 0.0–0.2)

## 2020-05-23 LAB — GLUCOSE, CAPILLARY: Glucose-Capillary: 258 mg/dL — ABNORMAL HIGH (ref 70–99)

## 2020-05-23 LAB — HEMOGLOBIN A1C
Hgb A1c MFr Bld: 9.8 % — ABNORMAL HIGH (ref 4.8–5.6)
Mean Plasma Glucose: 234.56 mg/dL

## 2020-05-23 LAB — PROTIME-INR
INR: 1 (ref 0.8–1.2)
Prothrombin Time: 13.5 seconds (ref 11.4–15.2)

## 2020-05-23 LAB — APTT: aPTT: 28 seconds (ref 24–36)

## 2020-05-23 LAB — TYPE AND SCREEN
ABO/RH(D): AB POS
Antibody Screen: NEGATIVE

## 2020-05-23 NOTE — Progress Notes (Signed)
PCP - Caryl Bis Cardiologist - Einar Gip, clearance in Epic  PPM/ICD - ICD Device Orders - staff message to Dr. Einar Gip  Rep Notified - yes, email sent to Ronalee Belts and Leanna Sato  Chest x-ray - n/a EKG - 04/04/20 Stress Test - n/a ECHO - 01/04/19 Cardiac Cath - 12/11/19  Sleep Study - yes, about 1 year ago, [facility] - [facility]  Fasting Blood Sugar - 130s Checks Blood Sugar 3-4 times a week CBG upon arrival to PAT was 258.  Patient ate pancakes and omelet for lunch at 1230.  States he takes medications as prescribed.  Blood Thinner Instructions: Brilinta, continue through surgery Aspirin Instructions: yes, continue through surgery  ERAS Protcol -n/a PRE-SURGERY Ensure or G2-   COVID TEST- [facility] on 05/23/20 prior to PAT   Anesthesia review: yes  Patient denies shortness of breath, fever, cough and chest pain at PAT appointment   All instructions explained to the patient, with a verbal understanding of the material. Patient agrees to go over the instructions while at home for a better understanding. Patient also instructed to self quarantine after being tested for COVID-19. The opportunity to ask questions was provided.

## 2020-05-23 NOTE — Anesthesia Preprocedure Evaluation (Addendum)
Anesthesia Evaluation  Patient identified by MRN, date of birth, ID band Patient awake    Reviewed: Allergy & Precautions, NPO status , Patient's Chart, lab work & pertinent test results, reviewed documented beta blocker date and time   History of Anesthesia Complications Negative for: history of anesthetic complications  Airway Mallampati: II  TM Distance: >3 FB Neck ROM: Full    Dental  (+) Edentulous Upper, Dental Advisory Given   Pulmonary sleep apnea, Continuous Positive Airway Pressure Ventilation and Oxygen sleep apnea , former smoker,  05/23/2020 SARS coronavirus NEG   breath sounds clear to auscultation       Cardiovascular hypertension, Pt. on medications and Pt. on home beta blockers (-) angina+ CAD, + Past MI, + Cardiac Stents, + CABG and + Peripheral Vascular Disease  + pacemaker (BiV) + Cardiac Defibrillator  Rhythm:Regular Rate:Normal  11/2019 ECHO: global LV hypokinesis, EF 30-35%, no significant valvular abnormalities   Neuro/Psych Depression negative neurological ROS     GI/Hepatic negative GI ROS, Neg liver ROS,   Endo/Other  diabetes (glu 215), Insulin Dependent, Oral Hypoglycemic AgentsMorbid obesity  Renal/GU Renal InsufficiencyRenal disease (creat 1.52)     Musculoskeletal  (+) Arthritis ,   Abdominal (+) + obese,   Peds  Hematology Brilinta   Anesthesia Other Findings   Reproductive/Obstetrics                           Anesthesia Physical Anesthesia Plan  ASA: IV  Anesthesia Plan: General   Post-op Pain Management:    Induction: Intravenous  PONV Risk Score and Plan: 2 and Ondansetron, Dexamethasone and Treatment may vary due to age or medical condition  Airway Management Planned: Oral ETT  Additional Equipment: Arterial line  Intra-op Plan:   Post-operative Plan: Extubation in OR  Informed Consent: I have reviewed the patients History and Physical,  chart, labs and discussed the procedure including the risks, benefits and alternatives for the proposed anesthesia with the patient or authorized representative who has indicated his/her understanding and acceptance.     Dental advisory given  Plan Discussed with: CRNA and Surgeon  Anesthesia Plan Comments: (See APP note by Durel Salts, FNP  Plan routine monitors, A-line, GETA with magnet deactivation of AICD, R2 pads)      Anesthesia Quick Evaluation

## 2020-05-23 NOTE — Progress Notes (Signed)
Anesthesia Chart Review:   Case: [id number] Date/Time: 05/26/20 0952   Procedure: LEFT TRANSCAROTID ARTERY REVASCULARIZATION (Left )   Anesthesia type: General   Pre-op diagnosis: BILATERAL CAROTID ARTERY STENOSIS   Location: [place] OR ROOM 16 / [place] [facility]   Surgeons: [name], MD      DISCUSSION: Pt is 65 years old with hx CAD (s/p CABG x2 in 2002, only LIMA-LAD still patent; stent to CX/OM bifurcation 2015; stent to LM and CX x2 2016; NSTEMI with angioplasty to LM and CX 10/08/19), ischemic cardiomyopathy with severe LV systolic function (EF 06-30% on 12/18/19 echo),recurrent sustained VT (s/p Medtronic BiV ICD 2016), HTN, DM, CKD (stage III)  Perioperative prescription for AICD pending   Hospitalized 11/14-17-21 for acute on chronic systolic HF  Hospitalized 9/13-14/21 for acute pulmonary edema, NSTEMI   VS: BP 135/73   Pulse 68   Temp 36.9 C (Oral)   Resp 18   Ht 5\' 7"  (1.702 m)   Wt 100.2 kg   SpO2 99%   BMI 34.61 kg/m    PROVIDERS: - PCP is  [name], MD  - Cardiologist is Adrian Prows, MD who referred pt for vascular surgery. Last office visit 05/19/20   LABS:  - HbA1c pending. Glucose 248   (all labs ordered are listed, but only abnormal results are displayed)  Labs Reviewed  GLUCOSE, CAPILLARY - Abnormal; Notable for the following components:      Result Value   Glucose-Capillary 258 (*)    All other components within normal limits  CBC - Abnormal; Notable for the following components:   Hemoglobin 12.6 (*)    HCT 38.8 (*)    All other components within normal limits  COMPREHENSIVE METABOLIC PANEL - Abnormal; Notable for the following components:   Sodium 134 (*)    Potassium 5.4 (*)    CO2 19 (*)    Glucose, Bld 248 (*)    BUN 29 (*)    Creatinine, Ser 1.52 (*)    Alkaline Phosphatase 145 (*)    GFR, Estimated 51 (*)    All other components within normal limits  URINALYSIS, ROUTINE W REFLEX MICROSCOPIC - Abnormal; Notable for the following  components:   Glucose, UA >=500 (*)    Hgb urine dipstick MODERATE (*)    Protein, ur 30 (*)    All other components within normal limits  SURGICAL PCR SCREEN  PROTIME-INR  APTT  HEMOGLOBIN A1C  TYPE AND SCREEN     IMAGES: CT angio neck 05/12/20:  1. 80% stenosis of both internal carotid arteries proximally, progressed from 2018. 2. 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 3. Moderate proximal right vertebral artery stenosis, mildly progressed. 4. Aortic Atherosclerosis    1 view CXR 12/09/19:  - Bilateral perihilar and lower lung zone pulmonary infiltrates most in keeping with moderate probable cardiogenic pulmonary edema.   EKG 04/04/20: AV paced rhythm   CV: Carotid duplex 03/18/20:  - Stenosis in the right internal carotid artery (80-99%). Peak velocity was 404/132 m/s. Stenosis in the right external carotid artery (<50%).  - Stenosis in the left internal carotid artery (80-99%), peak velocity was  405/184 cm/s. Stenosis in the left external carotid artery (<50%).  - The PSV internal/common carotid artery ratio of 7.09 right and 6.11 left is consistent with a stenosis of >70% bilaterally.  - Antegrade right vertebral artery flow. Antegrade left vertebral artery  flow.  - Follow up in four months  is appropriate if clinically indicated. See  images enclosed.  - MRA of the carotid system may be helpful to further evaluate stenosis  Severity.   Echo 12/18/19:  - Poor eco window and wall motion with reduced sensitivity.  - Left ventricle cavity is mildly dilated. Dilated cardiomyopathy.  - Hypokinetic global wall motion. Abnormal septal wall motion due to post-operative coronary artery bypass graft. Indeterminate diastolic  filling pattern due to paced rhythm. Moderately depressed LV systolic function with visual EF 30-35%. Calculated EF 37%.  - Left atrial cavity is mildly dilated at 4.2 cm.  - Right ventricle cavity is  normal in size. Normal right ventricular  function. RV Pacemaker wires visualized.  - No significant change since 06/15/2019. EF previously 35-40%.   Cardiac cath 12/11/19:  - LM: Protected. Patent LM/LCx stent - LAD: Prox 100% occluded. Bypassed by patent LIMA attaching in mid LAD.  Mid to distal LAD 50% disease LCxL: Patent LM/LCx stent. OM3 60% stenosis RCA: Small caliber with severe diffuse disease. Dampening of the pressures noted on engagement  No change in coronary anatomy compared to prior cath in 09/2019 Current presentation likely due to acute on chronic HFrEF Continue guideline directed medical therapy    Past Medical History:  Diagnosis Date  . AICD (automatic cardioverter/defibrillator) present    Medtronic  . Arthritis    "thumbs"  (08/02/2017)  . CHF (congestive heart failure) ([place])   . Chronic combined systolic and diastolic heart failure ([place])    a. 08/2017 Echo: EF 20-25%, mod glob HK. Sev distal ant sept, inflat HK. Apical AK. Gr2 DD. Mildly reduced RV fxn.  . Colon polyps   . Coronary artery disease    a. s/p CABG x 2 (LIMA->LAD, VG->OM); b. Multiple PCI's to LM/LCX/OM; c. 07/2017 PTCA of LM/LCX w/ early ISR-->repeat PCI/DES to LM (3.5x20 Synergy DES) and LCX (3.0x20 Synergy DES); d. 07/2018 Relook Cath: LM patent stent, LAD 100ost, LCX patent stent, OM1 99/60, LIMA->LAD ok. VG->dLCX 47 (old).  . Depression   . Encounter for assessment of implantable cardioverter-defibrillator (ICD) 09/27/2018  . High cholesterol   . History of hiatal hernia   . Hypertension   . ICD; Biventricular  Medtronic ICD Amplia MRI QWuad CRT-D  in situ 10/29/14 10/29/2014   Remote ICD check 09.23.20:  One 6 beat NSVT. No therapy.  1 SVT episode @ 130 bpm (38 Sec).  There were 23 Vent sense episodes detected for up to 1.1 min/day (AT). Health trends (patient activity, heart rate variability, average heart rates) are stable.Trans-thoracic impedance trends and the OptiVol Fluid Index do no present  significant abnormalities. Battery longevity is 4.3 years. RA pacing is 3  . Ischemic cardiomyopathy 09/27/2018  . MI (myocardial infarction) ([place]) 2003  . NSTEMI (non-ST elevated myocardial infarction) ([place]) 07/16/2014  . OSA on CPAP   . Oxygen deficiency   . Peripheral vascular disease ([place])   . Pneumonia 10/2014  . Proteinuria   . Sleep apnea   . Type II diabetes mellitus (H[facility])    insulin dependent    Past Surgical History:  Procedure Laterality Date  . BIOPSY  09/19/2018   Procedure: BIOPSY;  Surgeon: Thornton Park, MD;  Location: [facility];  Service: Gastroenterology;;  . CARDIAC CATHETERIZATION N/A 07/16/2014   Procedure: Left Heart Cath and Coronary Angiography;  Surgeon: Adrian Prows, MD;  Location: [facility];  Service: Cardiovascular;  Laterality: N/A;  . CARDIAC CATHETERIZATION  07/16/2014   Procedure: Coronary Balloon Angioplasty;  Surgeon: Adrian Prows,  MD;  Location: [facility] [facility];  Service: Cardiovascular;;  . CARDIAC CATHETERIZATION  2003  . CARDIAC DEFIBRILLATOR PLACEMENT  2016  . CATARACT EXTRACTION W/ INTRAOCULAR LENS IMPLANT Left 03/2014  . COLONOSCOPY    . COLONOSCOPY WITH PROPOFOL N/A 09/19/2018   Procedure: COLONOSCOPY WITH PROPOFOL;  Surgeon: Thornton Park, MD;  Location: [facility];  Service: Gastroenterology;  Laterality: N/A;  . CORONARY ANGIOPLASTY WITH STENT PLACEMENT     "I've got a total of 8 stents in there; mostly [facility]ine at [facility]" (08/02/2017)  . CORONARY ARTERY BYPASS GRAFT  ~ 2003   "CABG X2"; [facility]  . CORONARY ATHERECTOMY N/A 08/16/2017   Procedure: CORONARY ATHERECTOMY;  Surgeon: Nigel Mormon, MD;  Location: [place] [facility];  Service: Cardiovascular;  Laterality: N/A;  . CORONARY BALLOON ANGIOPLASTY N/A 08/04/2017   Procedure: CORONARY BALLOON ANGIOPLASTY;  Surgeon: Adrian Prows, MD;  Location: [place] [facility];  Service: Cardiovascular;  Laterality: N/A;  . CORONARY BALLOON ANGIOPLASTY N/A 01/11/2019    Procedure: CORONARY BALLOON ANGIOPLASTY;  Surgeon: Adrian Prows, MD;  Location: [place] [facility];  Service: Cardiovascular;  Laterality: N/A;  . CORONARY BALLOON ANGIOPLASTY N/A 10/09/2019   Procedure: CORONARY BALLOON ANGIOPLASTY;  Surgeon: Adrian Prows, MD;  Location: [place] [facility];  Service: Cardiovascular;  Laterality: N/A;  . CORONARY STENT INTERVENTION N/A 08/16/2017   Procedure: CORONARY STENT INTERVENTION;  Surgeon: Nigel Mormon, MD;  Location: [place] [facility];  Service: Cardiovascular;  Laterality: N/A;  . CORONARY STENT INTERVENTION N/A 10/09/2019   Procedure: CORONARY STENT INTERVENTION;  Surgeon: Adrian Prows, MD;  Location: [place] [facility];  Service: Cardiovascular;  Laterality: N/A;  . ELBOW SURGERY Left ?2001   "pinched nerve"  . ENDOSCOPIC MUCOSAL RESECTION  12/18/2018   Procedure: ENDOSCOPIC MUCOSAL RESECTION;  Surgeon: Rush Landmark Telford Nab., MD;  Location: [facility];  Service: Gastroenterology;;  . Otho Darner SIGMOIDOSCOPY N/A 12/18/2018   Procedure: Beryle Quant;  Surgeon: Irving Copas., MD;  Location: [facility];  Service: Gastroenterology;  Laterality: N/A;  . HEMOSTASIS CLIP PLACEMENT  12/18/2018   Procedure: HEMOSTASIS CLIP PLACEMENT;  Surgeon: Irving Copas., MD;  Location: [facility] [place];  Service: Gastroenterology;;  . LEFT HEART CATH AND CORONARY ANGIOGRAPHY N/A 01/11/2019   Procedure: LEFT HEART CATH AND CORONARY ANGIOGRAPHY;  Surgeon: Adrian Prows, MD;  Location: [place] [facility];  Service: Cardiovascular;  Laterality: N/A;  . LEFT HEART CATH AND CORS/GRAFTS ANGIOGRAPHY N/A 08/04/2017   Procedure: LEFT HEART CATH AND CORS/GRAFTS ANGIOGRAPHY;  Surgeon: Adrian Prows, MD;  Location: [place] [facility];  Service: Cardiovascular;  Laterality: N/A;  . LEFT HEART CATH AND CORS/GRAFTS ANGIOGRAPHY N/A 08/20/2017   Procedure: LEFT HEART CATH AND CORS/GRAFTS ANGIOGRAPHY;  Surgeon: Nigel Mormon, MD;  Location: [place] [facility];   Service: Cardiovascular;  Laterality: N/A;  . LEFT HEART CATH AND CORS/GRAFTS ANGIOGRAPHY N/A 01/08/2019   Procedure: LEFT HEART CATH AND CORS/GRAFTS ANGIOGRAPHY;  Surgeon: [place] Hampshire, MD;  Location: [place] [facility];  Service: Cardiovascular;  Laterality: N/A;  . LEFT HEART CATH AND CORS/GRAFTS ANGIOGRAPHY N/A 10/09/2019   Procedure: LEFT HEART CATH AND CORS/GRAFTS ANGIOGRAPHY;  Surgeon: Adrian Prows, MD;  Location: [place] [facility];  Service: Cardiovascular;  Laterality: N/A;  . LEFT HEART CATH AND CORS/GRAFTS ANGIOGRAPHY N/A 12/11/2019   Procedure: LEFT HEART CATH AND CORS/GRAFTS ANGIOGRAPHY;  Surgeon: Nigel Mormon, MD;  Location: [place] [facility];  Service: Cardiovascular;  Laterality: N/A;  . POLYPECTOMY  09/19/2018   Procedure: POLYPECTOMY;  Surgeon: Thornton Park, MD;  Location: [facility];  Service: Gastroenterology;;  . Lia Foyer INJECTION  09/19/2018   Procedure: SUBMUCOSAL INJECTION;  Surgeon: Thornton Park, MD;  Location: [facility];  Service: Gastroenterology;;  . Lia Foyer LIFTING INJECTION  12/18/2018   Procedure: SUBMUCOSAL LIFTING INJECTION;  Surgeon: Irving Copas., MD;  Location: [place];  Service: Gastroenterology;;    MEDICATIONS: . amiodarone (PACERONE) 200 MG tablet  . aspirin EC 81 MG EC tablet  . carvedilol (COREG) 12.5 MG tablet  . Cholecalciferol (VITAMIN D3) 50 MCG (2000 UT) TABS  . Cyanocobalamin 2500 MCG CHEW  . insulin aspart protamine- aspart (NOVOLOG MIX 70/30) (70-30) 100 UNIT/ML injection  . metFORMIN (GLUCOPHAGE-XR) 500 MG 24 hr tablet  . nitroGLYCERIN (NITROSTAT) 0.4 MG SL tablet  . Omega-3 Fatty Acids (FISH OIL PO)  . OXYGEN  . potassium chloride SA (KLOR-CON) 20 MEQ tablet  . PRESCRIPTION MEDICATION  . rosuvastatin (CRESTOR) 40 MG tablet  . sacubitril-valsartan (ENTRESTO) 97-103 MG  . Semaglutide, 1 MG/DOSE, 2 MG/1.5ML SOPN  . sertraline (ZOLOFT) 100 MG tablet  . ticagrelor (BRILINTA) 90 MG TABS  tablet  . torsemide (DEMADEX) 20 MG tablet   No current facility-administered medications for this encounter.   Pt to continue brilinta, ASA, and rosuvastatin perioperatively   If blood glucose acceptable day of surgery, I anticipate pt can proceed with surgery as scheduled.  Willeen Cass, PhD, FNP-BC [facility] Stay [facility]/Anesthesiology Phone: [phone] 05/23/2020 4:18 PM

## 2020-05-24 LAB — SARS CORONAVIRUS 2 (TAT 6-24 HRS): SARS Coronavirus 2: NEGATIVE

## 2020-05-26 ENCOUNTER — Inpatient Hospital Stay (HOSPITAL_COMMUNITY)
Admission: RE | Admit: 2020-05-26 | Discharge: 2020-05-27 | DRG: 035 | Disposition: A | Discharge status: Home / Self Care | Payer: No Typology Code available for payment source | Attending: Vascular Surgery | Admitting: Vascular Surgery

## 2020-05-26 ENCOUNTER — Inpatient Hospital Stay (HOSPITAL_COMMUNITY): Payer: No Typology Code available for payment source

## 2020-05-26 ENCOUNTER — Encounter (HOSPITAL_COMMUNITY): Payer: Self-pay | Admitting: Vascular Surgery

## 2020-05-26 ENCOUNTER — Other Ambulatory Visit: Payer: Self-pay

## 2020-05-26 ENCOUNTER — Inpatient Hospital Stay (HOSPITAL_COMMUNITY): Payer: No Typology Code available for payment source | Admitting: Emergency Medicine

## 2020-05-26 ENCOUNTER — Encounter (HOSPITAL_COMMUNITY)
Admission: RE | Disposition: A | Discharge status: Home / Self Care | Payer: Self-pay | Source: Home / Self Care | Attending: Vascular Surgery

## 2020-05-26 ENCOUNTER — Inpatient Hospital Stay (HOSPITAL_COMMUNITY): Payer: No Typology Code available for payment source | Admitting: Certified Registered Nurse Anesthetist

## 2020-05-26 DIAGNOSIS — Z7982 Long term (current) use of aspirin: Secondary | ICD-10-CM | POA: Diagnosis not present

## 2020-05-26 DIAGNOSIS — E1151 Type 2 diabetes mellitus with diabetic peripheral angiopathy without gangrene: Secondary | ICD-10-CM | POA: Diagnosis present

## 2020-05-26 DIAGNOSIS — Z955 Presence of coronary angioplasty implant and graft: Secondary | ICD-10-CM

## 2020-05-26 DIAGNOSIS — I6523 Occlusion and stenosis of bilateral carotid arteries: Secondary | ICD-10-CM | POA: Diagnosis present

## 2020-05-26 DIAGNOSIS — I6522 Occlusion and stenosis of left carotid artery: Secondary | ICD-10-CM | POA: Diagnosis not present

## 2020-05-26 DIAGNOSIS — I130 Hypertensive heart and chronic kidney disease with heart failure and stage 1 through stage 4 chronic kidney disease, or unspecified chronic kidney disease: Secondary | ICD-10-CM | POA: Diagnosis present

## 2020-05-26 DIAGNOSIS — Z7902 Long term (current) use of antithrombotics/antiplatelets: Secondary | ICD-10-CM

## 2020-05-26 DIAGNOSIS — G4733 Obstructive sleep apnea (adult) (pediatric): Secondary | ICD-10-CM | POA: Diagnosis present

## 2020-05-26 DIAGNOSIS — Z8249 Family history of ischemic heart disease and other diseases of the circulatory system: Secondary | ICD-10-CM

## 2020-05-26 DIAGNOSIS — N183 Chronic kidney disease, stage 3 unspecified: Secondary | ICD-10-CM | POA: Diagnosis present

## 2020-05-26 DIAGNOSIS — Z794 Long term (current) use of insulin: Secondary | ICD-10-CM | POA: Diagnosis not present

## 2020-05-26 DIAGNOSIS — Z951 Presence of aortocoronary bypass graft: Secondary | ICD-10-CM | POA: Diagnosis not present

## 2020-05-26 DIAGNOSIS — Z87891 Personal history of nicotine dependence: Secondary | ICD-10-CM

## 2020-05-26 DIAGNOSIS — I255 Ischemic cardiomyopathy: Secondary | ICD-10-CM | POA: Diagnosis present

## 2020-05-26 DIAGNOSIS — Z79899 Other long term (current) drug therapy: Secondary | ICD-10-CM | POA: Diagnosis not present

## 2020-05-26 DIAGNOSIS — I2510 Atherosclerotic heart disease of native coronary artery without angina pectoris: Secondary | ICD-10-CM | POA: Diagnosis present

## 2020-05-26 DIAGNOSIS — Z83438 Family history of other disorder of lipoprotein metabolism and other lipidemia: Secondary | ICD-10-CM

## 2020-05-26 DIAGNOSIS — Z888 Allergy status to other drugs, medicaments and biological substances status: Secondary | ICD-10-CM

## 2020-05-26 DIAGNOSIS — Z833 Family history of diabetes mellitus: Secondary | ICD-10-CM

## 2020-05-26 DIAGNOSIS — F32A Depression, unspecified: Secondary | ICD-10-CM | POA: Diagnosis present

## 2020-05-26 DIAGNOSIS — I6502 Occlusion and stenosis of left vertebral artery: Secondary | ICD-10-CM | POA: Diagnosis present

## 2020-05-26 DIAGNOSIS — I5042 Chronic combined systolic (congestive) and diastolic (congestive) heart failure: Secondary | ICD-10-CM | POA: Diagnosis present

## 2020-05-26 DIAGNOSIS — E7800 Pure hypercholesterolemia, unspecified: Secondary | ICD-10-CM | POA: Diagnosis present

## 2020-05-26 DIAGNOSIS — Z95810 Presence of automatic (implantable) cardiac defibrillator: Secondary | ICD-10-CM | POA: Diagnosis not present

## 2020-05-26 DIAGNOSIS — I252 Old myocardial infarction: Secondary | ICD-10-CM

## 2020-05-26 DIAGNOSIS — Z7984 Long term (current) use of oral hypoglycemic drugs: Secondary | ICD-10-CM | POA: Diagnosis not present

## 2020-05-26 HISTORY — PX: ULTRASOUND GUIDANCE FOR VASCULAR ACCESS: SHX6516

## 2020-05-26 HISTORY — PX: TRANSCAROTID ARTERY REVASCULARIZATIONÂ: SHX6778

## 2020-05-26 LAB — GLUCOSE, CAPILLARY
Glucose-Capillary: 215 mg/dL — ABNORMAL HIGH (ref 70–99)
Glucose-Capillary: 159 mg/dL — ABNORMAL HIGH (ref 70–99)
Glucose-Capillary: 164 mg/dL — ABNORMAL HIGH (ref 70–99)
Glucose-Capillary: 214 mg/dL — ABNORMAL HIGH (ref 70–99)
Glucose-Capillary: 218 mg/dL — ABNORMAL HIGH (ref 70–99)

## 2020-05-26 LAB — BASIC METABOLIC PANEL
Anion gap: 8 (ref 5–15)
BUN: 26 mg/dL — ABNORMAL HIGH (ref 8–23)
CO2: 17 mmol/L — ABNORMAL LOW (ref 22–32)
Calcium: 9 mg/dL (ref 8.9–10.3)
Chloride: 108 mmol/L (ref 98–111)
Creatinine, Ser: 1.41 mg/dL — ABNORMAL HIGH (ref 0.61–1.24)
GFR, Estimated: 56 mL/min — ABNORMAL LOW (ref >60)
Glucose, Bld: 219 mg/dL — ABNORMAL HIGH (ref 70–99)
Potassium: 4.7 mmol/L (ref 3.5–5.1)
Sodium: 133 mmol/L — ABNORMAL LOW (ref 135–145)

## 2020-05-26 LAB — ABO/RH: ABO/RH(D): AB POS

## 2020-05-26 LAB — POCT ACTIVATED CLOTTING TIME: Activated Clotting Time: 297 seconds

## 2020-05-26 SURGERY — TRANSCAROTID ARTERY REVASCULARIZATION (TCAR)
Anesthesia: General | Site: Neck | Laterality: Right

## 2020-05-26 MED ORDER — PROTAMINE SULFATE 10 MG/ML IV SOLN
INTRAVENOUS | Status: DC | PRN
  Administered 2020-05-26: 50 mg via INTRAVENOUS

## 2020-05-26 MED ORDER — PANTOPRAZOLE SODIUM 40 MG PO TBEC
40.0000 mg | DELAYED_RELEASE_TABLET | Freq: Every day | ORAL | Status: DC
  Administered 2020-05-26 – 2020-05-27 (×2): 40 mg via ORAL
  Filled 2020-05-26 (×2): qty 1

## 2020-05-26 MED ORDER — PROPOFOL 10 MG/ML IV BOLUS
INTRAVENOUS | Status: AC
  Filled 2020-05-26: qty 20

## 2020-05-26 MED ORDER — CHLORHEXIDINE GLUCONATE 0.12 % MT SOLN
OROMUCOSAL | Status: AC
  Administered 2020-05-26: 15 mL via OROMUCOSAL
  Filled 2020-05-26: qty 15

## 2020-05-26 MED ORDER — HYDROMORPHONE HCL 1 MG/ML IJ SOLN: 0.5000 mg | INTRAMUSCULAR | Status: DC | PRN

## 2020-05-26 MED ORDER — PROMETHAZINE HCL 25 MG/ML IJ SOLN: 6.2500 mg | INTRAMUSCULAR | Status: DC | PRN

## 2020-05-26 MED ORDER — OXYCODONE HCL 5 MG PO TABS: 5.0000 mg | ORAL_TABLET | Freq: Once | ORAL | Status: DC | PRN

## 2020-05-26 MED ORDER — BISACODYL 10 MG RE SUPP: 10.0000 mg | Freq: Every day | RECTAL | Status: DC | PRN

## 2020-05-26 MED ORDER — ROCURONIUM BROMIDE 10 MG/ML (PF) SYRINGE
PREFILLED_SYRINGE | INTRAVENOUS | Status: DC | PRN
  Administered 2020-05-26: 70 mg via INTRAVENOUS

## 2020-05-26 MED ORDER — HYDRALAZINE HCL 20 MG/ML IJ SOLN
5.0000 mg | INTRAMUSCULAR | Status: DC | PRN
  Administered 2020-05-26: 5 mg via INTRAVENOUS
  Filled 2020-05-26: qty 1

## 2020-05-26 MED ORDER — PHENOL 1.4 % MT LIQD: 1.0000 | OROMUCOSAL | Status: DC | PRN

## 2020-05-26 MED ORDER — ONDANSETRON HCL 4 MG/2ML IJ SOLN
INTRAMUSCULAR | Status: AC
  Filled 2020-05-26: qty 2

## 2020-05-26 MED ORDER — LABETALOL HCL 5 MG/ML IV SOLN
INTRAVENOUS | Status: AC
  Filled 2020-05-26: qty 4

## 2020-05-26 MED ORDER — FENTANYL CITRATE (PF) 100 MCG/2ML IJ SOLN
INTRAMUSCULAR | Status: DC | PRN
  Administered 2020-05-26: 50 ug via INTRAVENOUS
  Administered 2020-05-26: 100 ug via INTRAVENOUS

## 2020-05-26 MED ORDER — AMIODARONE HCL 200 MG PO TABS
200.0000 mg | ORAL_TABLET | Freq: Every day | ORAL | Status: DC
  Administered 2020-05-27: 200 mg via ORAL
  Filled 2020-05-26: qty 1

## 2020-05-26 MED ORDER — SODIUM CHLORIDE 0.9 % IV SOLN: 500.0000 mL | Freq: Once | INTRAVENOUS | Status: DC | PRN

## 2020-05-26 MED ORDER — GLYCOPYRROLATE 0.2 MG/ML IJ SOLN
INTRAMUSCULAR | Status: DC | PRN
  Administered 2020-05-26: .2 mg via INTRAVENOUS

## 2020-05-26 MED ORDER — ONDANSETRON HCL 4 MG/2ML IJ SOLN
INTRAMUSCULAR | Status: DC | PRN
  Administered 2020-05-26: 4 mg via INTRAVENOUS

## 2020-05-26 MED ORDER — ROSUVASTATIN CALCIUM 20 MG PO TABS
40.0000 mg | ORAL_TABLET | Freq: Every day | ORAL | Status: DC
  Administered 2020-05-26: 40 mg via ORAL
  Filled 2020-05-26: qty 2

## 2020-05-26 MED ORDER — DEXAMETHASONE SODIUM PHOSPHATE 10 MG/ML IJ SOLN
INTRAMUSCULAR | Status: AC
  Filled 2020-05-26: qty 1

## 2020-05-26 MED ORDER — OXYCODONE-ACETAMINOPHEN 5-325 MG PO TABS: 1.0000 | ORAL_TABLET | ORAL | Status: DC | PRN

## 2020-05-26 MED ORDER — LACTATED RINGERS IV SOLN: INTRAVENOUS | Status: DC

## 2020-05-26 MED ORDER — HEMOSTATIC AGENTS (NO CHARGE) OPTIME
TOPICAL | Status: DC | PRN
  Administered 2020-05-26: 1 via TOPICAL

## 2020-05-26 MED ORDER — DEXAMETHASONE SODIUM PHOSPHATE 10 MG/ML IJ SOLN
INTRAMUSCULAR | Status: DC | PRN
  Administered 2020-05-26: 4 mg via INTRAVENOUS

## 2020-05-26 MED ORDER — HEPARIN SODIUM (PORCINE) 1000 UNIT/ML IJ SOLN
INTRAMUSCULAR | Status: DC | PRN
  Administered 2020-05-26: 10000 [IU] via INTRAVENOUS

## 2020-05-26 MED ORDER — SODIUM CHLORIDE 0.9 % IV SOLN
INTRAVENOUS | Status: DC | PRN
  Administered 2020-05-26: 500 mL

## 2020-05-26 MED ORDER — SACUBITRIL-VALSARTAN 97-103 MG PO TABS
1.0000 | ORAL_TABLET | Freq: Two times a day (BID) | ORAL | Status: DC
  Administered 2020-05-26 – 2020-05-27 (×3): 1 via ORAL
  Filled 2020-05-26 (×4): qty 1

## 2020-05-26 MED ORDER — EPHEDRINE SULFATE 50 MG/ML IJ SOLN
INTRAMUSCULAR | Status: DC | PRN
  Administered 2020-05-26 (×2): 5 mg via INTRAVENOUS

## 2020-05-26 MED ORDER — IODIXANOL 320 MG/ML IV SOLN
INTRAVENOUS | Status: DC | PRN
  Administered 2020-05-26: 20 mL via INTRA_ARTERIAL

## 2020-05-26 MED ORDER — SERTRALINE HCL 50 MG PO TABS
50.0000 mg | ORAL_TABLET | Freq: Every day | ORAL | Status: DC
  Administered 2020-05-26 – 2020-05-27 (×2): 50 mg via ORAL
  Filled 2020-05-26 (×2): qty 1

## 2020-05-26 MED ORDER — 0.9 % SODIUM CHLORIDE (POUR BTL) OPTIME
TOPICAL | Status: DC | PRN
  Administered 2020-05-26: 1000 mL

## 2020-05-26 MED ORDER — POLYETHYLENE GLYCOL 3350 17 G PO PACK: 17.0000 g | PACK | Freq: Every day | ORAL | Status: DC | PRN

## 2020-05-26 MED ORDER — METOPROLOL TARTRATE 5 MG/5ML IV SOLN
2.0000 mg | INTRAVENOUS | Status: DC | PRN
Start: 2020-05-26 — End: 2020-05-27

## 2020-05-26 MED ORDER — NITROGLYCERIN 0.4 MG SL SUBL: 0.4000 mg | SUBLINGUAL_TABLET | SUBLINGUAL | Status: DC | PRN

## 2020-05-26 MED ORDER — LIDOCAINE 2% (20 MG/ML) 5 ML SYRINGE
INTRAMUSCULAR | Status: DC | PRN
  Administered 2020-05-26: 20 mg via INTRAVENOUS

## 2020-05-26 MED ORDER — SODIUM CHLORIDE 0.9 % IV SOLN
INTRAVENOUS | Status: AC
  Filled 2020-05-26: qty 1.2

## 2020-05-26 MED ORDER — TORSEMIDE 20 MG PO TABS: 20.0000 mg | ORAL_TABLET | Freq: Every day | ORAL | Status: DC | PRN

## 2020-05-26 MED ORDER — LABETALOL HCL 5 MG/ML IV SOLN
10.0000 mg | INTRAVENOUS | Status: DC | PRN
  Administered 2020-05-26: 10 mg via INTRAVENOUS

## 2020-05-26 MED ORDER — POTASSIUM CHLORIDE CRYS ER 20 MEQ PO TBCR
20.0000 meq | EXTENDED_RELEASE_TABLET | Freq: Two times a day (BID) | ORAL | Status: DC
  Administered 2020-05-26 – 2020-05-27 (×2): 20 meq via ORAL
  Filled 2020-05-26 (×3): qty 1

## 2020-05-26 MED ORDER — CEFAZOLIN SODIUM-DEXTROSE 2-4 GM/100ML-% IV SOLN
2.0000 g | INTRAVENOUS | Status: AC
  Administered 2020-05-26: 2 g via INTRAVENOUS

## 2020-05-26 MED ORDER — ORAL CARE MOUTH RINSE: 15.0000 mL | Freq: Once | OROMUCOSAL | Status: AC

## 2020-05-26 MED ORDER — FENTANYL CITRATE (PF) 250 MCG/5ML IJ SOLN
INTRAMUSCULAR | Status: AC
  Filled 2020-05-26: qty 5

## 2020-05-26 MED ORDER — CEFAZOLIN SODIUM-DEXTROSE 2-4 GM/100ML-% IV SOLN
INTRAVENOUS | Status: AC
  Filled 2020-05-26: qty 100

## 2020-05-26 MED ORDER — CARVEDILOL 12.5 MG PO TABS
12.5000 mg | ORAL_TABLET | Freq: Two times a day (BID) | ORAL | Status: DC
  Administered 2020-05-26 – 2020-05-27 (×3): 12.5 mg via ORAL
  Filled 2020-05-26 (×3): qty 1

## 2020-05-26 MED ORDER — FENTANYL CITRATE (PF) 100 MCG/2ML IJ SOLN: 25.0000 ug | INTRAMUSCULAR | Status: DC | PRN

## 2020-05-26 MED ORDER — SODIUM CHLORIDE 0.9 % IV SOLN: INTRAVENOUS | Status: DC

## 2020-05-26 MED ORDER — PHENYLEPHRINE HCL (PRESSORS) 10 MG/ML IV SOLN
INTRAVENOUS | Status: AC
  Filled 2020-05-26: qty 1

## 2020-05-26 MED ORDER — LABETALOL HCL 5 MG/ML IV SOLN: 5.0000 mg | INTRAVENOUS | Status: DC | PRN

## 2020-05-26 MED ORDER — PHENYLEPHRINE 40 MCG/ML (10ML) SYRINGE FOR IV PUSH (FOR BLOOD PRESSURE SUPPORT)
PREFILLED_SYRINGE | INTRAVENOUS | Status: AC
  Filled 2020-05-26: qty 10

## 2020-05-26 MED ORDER — PROTAMINE SULFATE 10 MG/ML IV SOLN
INTRAVENOUS | Status: AC
  Filled 2020-05-26: qty 10

## 2020-05-26 MED ORDER — LACTATED RINGERS IV SOLN: INTRAVENOUS | Status: DC | PRN

## 2020-05-26 MED ORDER — CHLORHEXIDINE GLUCONATE CLOTH 2 % EX PADS: 6.0000 | MEDICATED_PAD | Freq: Once | CUTANEOUS | Status: DC

## 2020-05-26 MED ORDER — MIDAZOLAM HCL 2 MG/2ML IJ SOLN
0.5000 mg | Freq: Once | INTRAMUSCULAR | Status: DC | PRN
Start: 2020-05-26 — End: 2020-05-26

## 2020-05-26 MED ORDER — CEFAZOLIN SODIUM-DEXTROSE 2-4 GM/100ML-% IV SOLN
2.0000 g | Freq: Three times a day (TID) | INTRAVENOUS | Status: AC
  Administered 2020-05-26 – 2020-05-27 (×2): 2 g via INTRAVENOUS
  Filled 2020-05-26 (×2): qty 100

## 2020-05-26 MED ORDER — ASPIRIN EC 81 MG PO TBEC
81.0000 mg | DELAYED_RELEASE_TABLET | Freq: Every day | ORAL | Status: DC
  Administered 2020-05-26 – 2020-05-27 (×2): 81 mg via ORAL
  Filled 2020-05-26 (×2): qty 1

## 2020-05-26 MED ORDER — PROPOFOL 10 MG/ML IV BOLUS
INTRAVENOUS | Status: DC | PRN
  Administered 2020-05-26: 70 mg via INTRAVENOUS

## 2020-05-26 MED ORDER — HEPARIN SODIUM (PORCINE) 1000 UNIT/ML IJ SOLN
INTRAMUSCULAR | Status: AC
  Filled 2020-05-26: qty 3

## 2020-05-26 MED ORDER — PHENYLEPHRINE HCL-NACL 10-0.9 MG/250ML-% IV SOLN
INTRAVENOUS | Status: DC | PRN
  Administered 2020-05-26: 50 ug/min via INTRAVENOUS

## 2020-05-26 MED ORDER — ONDANSETRON HCL 4 MG/2ML IJ SOLN: 4.0000 mg | Freq: Four times a day (QID) | INTRAMUSCULAR | Status: DC | PRN

## 2020-05-26 MED ORDER — MEPERIDINE HCL 25 MG/ML IJ SOLN: 6.2500 mg | INTRAMUSCULAR | Status: DC | PRN

## 2020-05-26 MED ORDER — CHLORHEXIDINE GLUCONATE 0.12 % MT SOLN: 15.0000 mL | Freq: Once | OROMUCOSAL | Status: AC

## 2020-05-26 MED ORDER — TICAGRELOR 90 MG PO TABS
90.0000 mg | ORAL_TABLET | Freq: Two times a day (BID) | ORAL | Status: DC
  Administered 2020-05-26 – 2020-05-27 (×2): 90 mg via ORAL
  Filled 2020-05-26 (×2): qty 1

## 2020-05-26 MED ORDER — ROCURONIUM BROMIDE 10 MG/ML (PF) SYRINGE
PREFILLED_SYRINGE | INTRAVENOUS | Status: AC
  Filled 2020-05-26: qty 20

## 2020-05-26 MED ORDER — DOCUSATE SODIUM 100 MG PO CAPS
100.0000 mg | ORAL_CAPSULE | Freq: Every day | ORAL | Status: DC
  Administered 2020-05-27: 100 mg via ORAL
  Filled 2020-05-26: qty 1

## 2020-05-26 MED ORDER — ATROPINE SULFATE 0.4 MG/ML IJ SOLN
INTRAMUSCULAR | Status: DC | PRN
  Administered 2020-05-26: .2 mg via INTRAVENOUS

## 2020-05-26 MED ORDER — PROTAMINE SULFATE 10 MG/ML IV SOLN
INTRAVENOUS | Status: AC
  Filled 2020-05-26: qty 25

## 2020-05-26 MED ORDER — ACETAMINOPHEN 650 MG RE SUPP: 325.0000 mg | RECTAL | Status: DC | PRN

## 2020-05-26 MED ORDER — MAGNESIUM SULFATE 2 GM/50ML IV SOLN: 2.0000 g | Freq: Every day | INTRAVENOUS | Status: DC | PRN

## 2020-05-26 MED ORDER — ACETAMINOPHEN 325 MG PO TABS: 325.0000 mg | ORAL_TABLET | ORAL | Status: DC | PRN

## 2020-05-26 MED ORDER — MIDAZOLAM HCL 2 MG/2ML IJ SOLN
INTRAMUSCULAR | Status: AC
  Filled 2020-05-26: qty 2

## 2020-05-26 MED ORDER — INSULIN ASPART 100 UNIT/ML IJ SOLN
0.0000 [IU] | Freq: Three times a day (TID) | INTRAMUSCULAR | Status: DC
  Administered 2020-05-26 – 2020-05-27 (×3): 5 [IU] via SUBCUTANEOUS

## 2020-05-26 MED ORDER — POTASSIUM CHLORIDE CRYS ER 20 MEQ PO TBCR
20.0000 meq | EXTENDED_RELEASE_TABLET | Freq: Every day | ORAL | Status: DC | PRN
Start: 2020-05-26 — End: 2020-05-27

## 2020-05-26 MED ORDER — ALUM & MAG HYDROXIDE-SIMETH 200-200-20 MG/5ML PO SUSP: 15.0000 mL | ORAL | Status: DC | PRN

## 2020-05-26 MED ORDER — LIDOCAINE 2% (20 MG/ML) 5 ML SYRINGE
INTRAMUSCULAR | Status: AC
  Filled 2020-05-26: qty 10

## 2020-05-26 MED ORDER — GUAIFENESIN-DM 100-10 MG/5ML PO SYRP: 15.0000 mL | ORAL_SOLUTION | ORAL | Status: DC | PRN

## 2020-05-26 MED ORDER — GLYCOPYRROLATE PF 0.2 MG/ML IJ SOSY
PREFILLED_SYRINGE | INTRAMUSCULAR | Status: AC
  Filled 2020-05-26: qty 1

## 2020-05-26 MED ORDER — OXYCODONE HCL 5 MG/5ML PO SOLN: 5.0000 mg | Freq: Once | ORAL | Status: DC | PRN

## 2020-05-26 MED ORDER — METFORMIN HCL ER 500 MG PO TB24
500.0000 mg | ORAL_TABLET | Freq: Two times a day (BID) | ORAL | Status: DC
  Administered 2020-05-26 – 2020-05-27 (×2): 500 mg via ORAL
  Filled 2020-05-26 (×2): qty 1

## 2020-05-26 SURGICAL SUPPLY — 55 items
BAG BANDED W/RUBBER/TAPE 36X54 (MISCELLANEOUS) ×3 IMPLANT
BALLN STERLING RX 6X30X80 (BALLOONS) ×3
BALLOON STERLING RX 6X30X80 (BALLOONS) ×2 IMPLANT
CANISTER SUCT 3000ML PPV (MISCELLANEOUS) ×3 IMPLANT
CATH BEACON 5 .035 40 KMP TP (CATHETERS) ×2 IMPLANT
CATH BEACON 5 .038 40 KMP TP (CATHETERS) ×3
CATH ROBINSON RED A/P 18FR (CATHETERS) IMPLANT
CLIP VESOCCLUDE MED 6/CT (CLIP) ×3 IMPLANT
CLIP VESOCCLUDE SM WIDE 6/CT (CLIP) ×3 IMPLANT
COVER DOME SNAP 22 D (MISCELLANEOUS) ×3 IMPLANT
COVER PROBE W GEL 5X96 (DRAPES) ×3 IMPLANT
COVER WAND RF STERILE (DRAPES) ×3 IMPLANT
DERMABOND ADVANCED (GAUZE/BANDAGES/DRESSINGS) ×1
DERMABOND ADVANCED .7 DNX12 (GAUZE/BANDAGES/DRESSINGS) ×2 IMPLANT
DRAPE FEMORAL ANGIO 80X135IN (DRAPES) ×3 IMPLANT
ELECT REM PT RETURN 9FT ADLT (ELECTROSURGICAL) ×3
ELECTRODE REM PT RTRN 9FT ADLT (ELECTROSURGICAL) ×2 IMPLANT
GLOVE BIO SURGEON STRL SZ7.5 (GLOVE) ×3 IMPLANT
GOWN STRL REUS W/ TWL LRG LVL3 (GOWN DISPOSABLE) ×4 IMPLANT
GOWN STRL REUS W/ TWL XL LVL3 (GOWN DISPOSABLE) ×2 IMPLANT
GOWN STRL REUS W/TWL LRG LVL3 (GOWN DISPOSABLE) ×6
GOWN STRL REUS W/TWL XL LVL3 (GOWN DISPOSABLE) ×3
HEMOSTAT SNOW SURGICEL 2X4 (HEMOSTASIS) ×3 IMPLANT
INTRODUCER KIT GALT 7CM (INTRODUCER) ×3
KIT BASIN OR (CUSTOM PROCEDURE TRAY) ×3 IMPLANT
KIT ENCORE 26 ADVANTAGE (KITS) ×3 IMPLANT
KIT INTRODUCER GALT 7 (INTRODUCER) ×2 IMPLANT
KIT TURNOVER KIT B (KITS) ×3 IMPLANT
NEEDLE HYPO 25GX1X1/2 BEV (NEEDLE) IMPLANT
PACK CAROTID (CUSTOM PROCEDURE TRAY) ×3 IMPLANT
POSITIONER HEAD DONUT 9IN (MISCELLANEOUS) ×3 IMPLANT
PROTECTION STATION PRESSURIZED (MISCELLANEOUS) ×3
SET MICROPUNCTURE 5F STIFF (MISCELLANEOUS) ×3 IMPLANT
SHEATH PINNACLE 5FR (SHEATH) IMPLANT
SHUNT CAROTID BYPASS 10 (VASCULAR PRODUCTS) IMPLANT
SHUNT CAROTID BYPASS 12FRX15.5 (VASCULAR PRODUCTS) IMPLANT
STATION PROTECTION PRESSURIZED (MISCELLANEOUS) ×2 IMPLANT
STENT TRANSCAROTID SYSTEM 8X40 (Permanent Stent) ×3 IMPLANT
SUT MNCRL AB 4-0 PS2 18 (SUTURE) ×3 IMPLANT
SUT PROLENE 5 0 C 1 24 (SUTURE) ×3 IMPLANT
SUT PROLENE 6 0 BV (SUTURE) IMPLANT
SUT PROLENE 7 0 BV 1 (SUTURE) IMPLANT
SUT SILK 2 0 PERMA HAND 18 BK (SUTURE) IMPLANT
SUT SILK 2 0 SH (SUTURE) ×3 IMPLANT
SUT SILK 2 0 SH CR/8 (SUTURE) ×3 IMPLANT
SUT SILK 3 0 (SUTURE)
SUT SILK 3-0 18XBRD TIE 12 (SUTURE) IMPLANT
SUT VIC AB 3-0 SH 27 (SUTURE) ×3
SUT VIC AB 3-0 SH 27X BRD (SUTURE) ×2 IMPLANT
SYR 10ML LL (SYRINGE) ×9 IMPLANT
SYR 20ML LL LF (SYRINGE) ×3 IMPLANT
SYR CONTROL 10ML LL (SYRINGE) IMPLANT
TOWEL GREEN STERILE (TOWEL DISPOSABLE) ×3 IMPLANT
WATER STERILE IRR 1000ML POUR (IV SOLUTION) ×3 IMPLANT
WIRE BENTSON .035X145CM (WIRE) ×3 IMPLANT

## 2020-05-26 NOTE — Discharge Instructions (Signed)
   [facility]  Discharge Instructions   Carotid Surgery  Please refer to the following instructions for your post-procedure care. Your surgeon or physician assistant will discuss any changes with you.  Activity  You are encouraged to walk as much as you can. You can slowly return to normal activities but must avoid strenuous activity and heavy lifting until your doctor tell you it's okay. Avoid activities such as vacuuming or swinging a golf club. You can drive after one week if you are comfortable and you are no longer taking prescription pain medications. It is normal to feel tired for serval weeks after your surgery. It is also normal to have difficulty with sleep habits, eating, and bowel movements after surgery. These will go away with time.  Bathing/Showering  Shower daily after you go home. Do not soak in a bathtub, hot tub, or swim until the incision heals completely.  Incision Care  Shower every day. Clean your incision with mild soap and water. Pat the area dry with a clean towel. You do not need a bandage unless otherwise instructed. Do not apply any ointments or creams to your incision. You may have skin glue on your incision. Do not peel it off. It will come off on its own in about one week. Your incision may feel thickened and raised for several weeks after your surgery. This is normal and the skin will soften over time.   For Men Only: It's okay to shave around the incision but do not shave the incision itself for 2 weeks. It is common to have numbness under your chin that could last for several months.  Diet  Resume your normal diet. There are no special food restrictions following this procedure. A low fat/low cholesterol diet is recommended for all patients with vascular disease. In order to heal from your surgery, it is CRITICAL to get adequate nutrition. Your body requires vitamins, minerals, and protein. Vegetables are the best source of  vitamins and minerals. Vegetables also provide the perfect balance of protein. Processed food has little nutritional value, so try to avoid this.  Medications  Resume taking all of your medications unless your doctor or physician assistant tells you not to. If your incision is causing pain, you may take over-the- counter pain relievers such as acetaminophen (Tylenol). If you were prescribed a stronger pain medication, please be aware these medications can cause nausea and constipation. Prevent nausea by taking the medication with a snack or meal. Avoid constipation by drinking plenty of fluids and eating foods with a high amount of fiber, such as fruits, vegetables, and grains.   Do not take Tylenol if you are taking prescription pain medications.  Follow Up  Our office will schedule a follow up appointment 2-3 weeks following discharge.  Please call us immediately for any of the following conditions  . Increased pain, redness, drainage (pus) from your incision site. . Fever of 101 degrees or higher. . If you should develop stroke (slurred speech, difficulty swallowing, weakness on one side of your body, loss of vision) you should call 911 and go to the nearest emergency room. .  Reduce your risk of vascular disease:  . Stop smoking. If you would like help call [facility] at 1-800-QUIT-NOW (1-800-784-8669) or [place] at 336-586-4000. . Manage your cholesterol . Maintain a desired weight . Control your diabetes . Keep your blood pressure down .  If you have any questions, please call the office at [phone]. 

## 2020-05-26 NOTE — Progress Notes (Signed)
Administered hydralazine x1 for bp=168/78. Will continue to monitor the pt. Lavenia Atlas, RN

## 2020-05-26 NOTE — H&P (Signed)
History and Physical Interval Note:  05/26/2020 9:43 AM  Martin Bush  has presented today for surgery, with the diagnosis of BILATERAL CAROTID ARTERY STENOSIS.  The various methods of treatment have been discussed with the patient and family. After consideration of risks, benefits and other options for treatment, the patient has consented to  Procedure(s): LEFT TRANSCAROTID ARTERY REVASCULARIZATION (Left) as a surgical intervention.  The patient's history has been reviewed, patient examined, no change in status, stable for surgery.  I have reviewed the patient's chart and labs.  Questions were answered to the patient's satisfaction.    Left TCAR  Martin Bush  Patient name: Martin Bush       MRN: [id number]        DOB: [birth year]        Sex: male  REASON FOR CONSULT: F/U after CTA to evaluate bilateral high-grade carotid artery stenosis  HPI: Martin Bush is a 65 y.o. male, with history of diabetes, coronary artery disease status post CABG as well as previous MI with ischemic cardiomyopathy and severe LV dysfunction, multiple previous PCI's, hypertension, hyperlipidemia that presents for follow-up to discuss CTA neck for evaluation of bilateral carotid stenosis.  As previously noted, he denies any previous history of TIA or stroke.  He is being followed by Dr. Einar Gip who is his cardiologist.  Most recent ultrasound on 03/23/2020 showed high-grade bilateral ICA stenosis.  On the right he had a velocity of 404/132 greater than 80% stenosis and on the left a velocity of 405/184 with greater than 80% stenosis.  He is on aspirin and Brilinta.  His wife state that he had 3 heart attacks at the end of last year.  No previous neck surgery or neck radiation. He is on Brilinta.  Very sedentary at home.  CTA neck on 05/12/2020 did confirm 80% stenosis of bilateral internal carotid arteries.  He also has a chronic occlusion of the left vertebral artery.  Reports no neurologic events since  I last saw him      Past Medical History:  Diagnosis Date  . AICD (automatic cardioverter/defibrillator) present    Medtronic  . Arthritis    "thumbs"  (08/02/2017)  . CHF (congestive heart failure) ([place])   . Chronic combined systolic and diastolic heart failure ([place])    a. 08/2017 Echo: EF 20-25%, mod glob HK. Sev distal ant sept, inflat HK. Apical AK. Gr2 DD. Mildly reduced RV fxn.  . Colon polyps   . Coronary artery disease    a. s/p CABG x 2 (LIMA->LAD, VG->OM); b. Multiple PCI's to LM/LCX/OM; c. 07/2017 PTCA of LM/LCX w/ early ISR-->repeat PCI/DES to LM (3.5x20 Synergy DES) and LCX (3.0x20 Synergy DES); d. 07/2018 Relook Cath: LM patent stent, LAD 100ost, LCX patent stent, OM1 99/60, LIMA->LAD ok. VG->dLCX 44 (old).  . Depression   . Encounter for assessment of implantable cardioverter-defibrillator (ICD) 09/27/2018  . High cholesterol   . Hypertension   . ICD; Biventricular  Medtronic ICD Amplia MRI QWuad CRT-D  in situ 10/29/14 10/29/2014   Remote ICD check 09.23.20:  One 6 beat NSVT. No therapy.  1 SVT episode @ 130 bpm (38 Sec).  There were 23 Vent sense episodes detected for up to 1.1 min/day (AT). Health trends (patient activity, heart rate variability, average heart rates) are stable.Trans-thoracic impedance trends and the OptiVol Fluid Index do no present significant abnormalities. Battery longevity is 4.3 years. RA pacing is 3  . Ischemic cardiomyopathy 09/27/2018  . MI (myocardial infarction) (  [place]) 2003  . NSTEMI (non-ST elevated myocardial infarction) ([place]) 07/16/2014  . OSA on CPAP   . Oxygen deficiency   . Pneumonia 10/2014  . Proteinuria   . Sleep apnea   . Type II diabetes mellitus (HCC)    insulin dependent         Past Surgical History:  Procedure Laterality Date  . BIOPSY  09/19/2018   Procedure: BIOPSY;  Surgeon: Thornton Park, MD;  [facility]: [facility];  Service: Gastroenterology;;  . CARDIAC CATHETERIZATION N/A 07/16/2014    Procedure: Left Heart Cath and Coronary Angiography;  Surgeon: Adrian Prows, MD;  Location: [place] [facility];  Service: Cardiovascular;  Laterality: N/A;  . CARDIAC CATHETERIZATION  07/16/2014   Procedure: Coronary Balloon Angioplasty;  Surgeon: Adrian Prows, MD;  Location: [place] [facility];  Service: Cardiovascular;;  . CARDIAC CATHETERIZATION  2003  . CARDIAC DEFIBRILLATOR PLACEMENT  2016  . CATARACT EXTRACTION W/ INTRAOCULAR LENS IMPLANT Left 03/2014  . COLONOSCOPY    . COLONOSCOPY WITH PROPOFOL N/A 09/19/2018   Procedure: COLONOSCOPY WITH PROPOFOL;  Surgeon: Thornton Park, MD;  [facility];  Service: Gastroenterology;  Laterality: N/A;  . CORONARY ANGIOPLASTY WITH STENT PLACEMENT     "I've got a total of 8 stents in there; mostly doine at [facility]" (08/02/2017)  . CORONARY ARTERY BYPASS GRAFT  ~ 2003   "CABG X2"; [facility]  . CORONARY ATHERECTOMY N/A 08/16/2017   Procedure: CORONARY ATHERECTOMY;  Surgeon: Nigel Mormon, MD;  Location: [place] [facility];  Service: Cardiovascular;  Laterality: N/A;  . CORONARY BALLOON ANGIOPLASTY N/A 08/04/2017   Procedure: CORONARY BALLOON ANGIOPLASTY;  Surgeon: Adrian Prows, MD;  Location: [place] [facility];  Service: Cardiovascular;  Laterality: N/A;  . CORONARY BALLOON ANGIOPLASTY N/A 01/11/2019   Procedure: CORONARY BALLOON ANGIOPLASTY;  Surgeon: Adrian Prows, MD;  Location: [place] [facility];  Service: Cardiovascular;  Laterality: N/A;  . CORONARY BALLOON ANGIOPLASTY N/A 10/09/2019   Procedure: CORONARY BALLOON ANGIOPLASTY;  Surgeon: Adrian Prows, MD;  Location: [place] [facility];  Service: Cardiovascular;  Laterality: N/A;  . CORONARY STENT INTERVENTION N/A 08/16/2017   Procedure: CORONARY STENT INTERVENTION;  Surgeon: Nigel Mormon, MD;  Location: [place] [facility];  Service: Cardiovascular;  Laterality: N/A;  . CORONARY STENT INTERVENTION N/A 10/09/2019   Procedure: CORONARY STENT INTERVENTION;  Surgeon:  Adrian Prows, MD;  Location: [place] [facility];  Service: Cardiovascular;  Laterality: N/A;  . ELBOW SURGERY Left ?2001   "pinched nerve"  . ENDOSCOPIC MUCOSAL RESECTION  12/18/2018   Procedure: ENDOSCOPIC MUCOSAL RESECTION;  Surgeon: Rush Landmark Telford Nab., MD;  Location: [place];  Service: Gastroenterology;;  . Otho Darner SIGMOIDOSCOPY N/A 12/18/2018   Procedure: Beryle Quant;  Surgeon: Irving Copas., MD;  Location: [place];  Service: Gastroenterology;  Laterality: N/A;  . HEMOSTASIS CLIP PLACEMENT  12/18/2018   Procedure: HEMOSTASIS CLIP PLACEMENT;  Surgeon: Irving Copas., MD;  Location: [place];  Service: Gastroenterology;;  . LEFT HEART CATH AND CORONARY ANGIOGRAPHY N/A 01/11/2019   Procedure: LEFT HEART CATH AND CORONARY ANGIOGRAPHY;  Surgeon: Adrian Prows, MD;  Location: [place] [facility];  Service: Cardiovascular;  Laterality: N/A;  . LEFT HEART CATH AND CORS/GRAFTS ANGIOGRAPHY N/A 08/04/2017   Procedure: LEFT HEART CATH AND CORS/GRAFTS ANGIOGRAPHY;  Surgeon: Adrian Prows, MD;  Location: [place] [facility];  Service: Cardiovascular;  Laterality: N/A;  . LEFT HEART CATH AND CORS/GRAFTS ANGIOGRAPHY N/A 08/20/2017   Procedure: LEFT HEART CATH AND CORS/GRAFTS ANGIOGRAPHY;  Surgeon: Nigel Mormon, MD;  Location: [place] [facility];  Service: Cardiovascular;  Laterality: N/A;  . LEFT HEART CATH AND CORS/GRAFTS ANGIOGRAPHY N/A 01/08/2019   Procedure: LEFT HEART CATH AND CORS/GRAFTS ANGIOGRAPHY;  Surgeon: Wellington Hampshire, MD;  Location: [place] [facility];  Service: Cardiovascular;  Laterality: N/A;  . LEFT HEART CATH AND CORS/GRAFTS ANGIOGRAPHY N/A 10/09/2019   Procedure: LEFT HEART CATH AND CORS/GRAFTS ANGIOGRAPHY;  Surgeon: Adrian Prows, MD;  Location: [place] [facility];  Service: Cardiovascular;  Laterality: N/A;  . LEFT HEART CATH AND CORS/GRAFTS ANGIOGRAPHY N/A 12/11/2019   Procedure: LEFT HEART CATH AND CORS/GRAFTS ANGIOGRAPHY;   Surgeon: Nigel Mormon, MD;  Location: [place] [facility];  Service: Cardiovascular;  Laterality: N/A;  . POLYPECTOMY  09/19/2018   Procedure: POLYPECTOMY;  Surgeon: Thornton Park, MD;  [facility];  Service: Gastroenterology;;  . Lia Foyer INJECTION  09/19/2018   Procedure: SUBMUCOSAL INJECTION;  Surgeon: Thornton Park, MD;  [facility];  Service: Gastroenterology;;  . Lia Foyer LIFTING INJECTION  12/18/2018   Procedure: SUBMUCOSAL LIFTING INJECTION;  Surgeon: Irving Copas., MD;  Location: [facility];  Service: Gastroenterology;;         Family History  Problem Relation Age of Onset  . Uterine cancer Mother   . Lung cancer Mother   . Hyperlipidemia Father   . Heart disease Father   . Hypertension Father   . Diabetes Father   . Colon cancer Neg Hx     SOCIAL HISTORY: Social History        Socioeconomic History  . Marital status: Married    Spouse name: Not on file  . Number of children: 3  . Years of education: Not on file  . Highest education level: Not on file  Occupational History  . Not on file  Tobacco Use  . Smoking status: Former Smoker    Packs/day: 0.25    Years: 27.00    Pack years: 6.75    Types: Cigars    Quit date: 2002    Years since quitting: 20.3  . Smokeless tobacco: Former Systems developer    Types: [place] date: 2002  Media planner  . Vaping Use: Never used  Substance and Sexual Activity  . Alcohol use: Not Currently  . Drug use: Never  . Sexual activity: Not Currently  Other Topics Concern  . Not on file  Social History Narrative  . Not on file   Social Determinants of Health      Financial Resource Strain: Low Risk   . Difficulty of Paying Living Expenses: Not hard at all  Food Insecurity: No Food Insecurity  . Worried About Charity fundraiser in the Last Year: Never true  . Ran Out of Food in the Last Year: Never true  Transportation Needs: No  Transportation Needs  . Lack of Transportation (Medical): No  . Lack of Transportation (Non-Medical): No  Physical Activity: Not on file  Stress: No Stress Concern Present  . Feeling of Stress : Not at all  Social Connections: Not on file  Intimate Partner Violence: Not At Risk  . Fear of Current or Ex-Partner: No  . Emotionally Abused: No  . Physically Abused: No  . Sexually Abused: No        Allergies  Allergen Reactions  . Diltiazem Rash          Current Outpatient Medications  Medication Sig Dispense Refill  . amiodarone (PACERONE) 200 MG tablet Take  1 tablet (200 mg total) by mouth daily.    [name] Kitchen aspirin EC 81 MG EC tablet Take 1 tablet (81 mg total) by mouth daily.    . carvedilol (COREG) 12.5 MG tablet Take 2 tablets (25 mg total) by mouth 2 (two) times daily. 180 tablet 3  . Cholecalciferol (VITAMIN D3) 50 MCG (2000 UT) TABS Take 2,000 Units by mouth 2 (two) times daily.     . Cyanocobalamin 2500 MCG CHEW Chew 2,500 mcg by mouth daily.     . insulin aspart protamine- aspart (NOVOLOG MIX 70/30) (70-30) 100 UNIT/ML injection Inject 0.66 mLs (66 Units total) into the skin daily with breakfast AND 0.6 mLs (60 Units total) daily with supper. (Patient taking differently: Inject 66 units into the skin in the morning with breakfast and 60 units with supper) 10 mL 11  . metFORMIN (GLUCOPHAGE-XR) 500 MG 24 hr tablet Take 1,000 mg by mouth 2 (two) times daily.    . nitroGLYCERIN (NITROSTAT) 0.4 MG SL tablet Place 1 tablet (0.4 mg total) under the tongue every 5 (five) minutes x 3 doses as needed for chest pain. 25 tablet 3  . Omega-3 Fatty Acids (FISH OIL) 1000 MG CAPS Take 2,000 mg by mouth 3 (three) times daily.     . OXYGEN Inhale 2 L/min into the lungs See admin instructions. 2 L/min at bedtime in conjunction with CPAP    . potassium chloride SA (KLOR-CON) 20 MEQ tablet Take 1 tablet (20 mEq total) by mouth 2 (two) times daily. 180 tablet 3  . PRESCRIPTION MEDICATION  Inhale into the lungs See admin instructions. CPAP- At bedtime    . rosuvastatin (CRESTOR) 40 MG tablet Take 1 tablet (40 mg total) by mouth at bedtime.    . sacubitril-valsartan (ENTRESTO) 97-103 MG Take 1 tablet by mouth 2 (two) times daily. 60 tablet   . Semaglutide, 1 MG/DOSE, 2 MG/1.5ML SOPN Inject 2 mg into the skin daily.    . sertraline (ZOLOFT) 100 MG tablet Take 50 mg by mouth daily.    . ticagrelor (BRILINTA) 90 MG TABS tablet Take 1 tablet (90 mg total) by mouth 2 (two) times daily. 60 tablet   . torsemide (DEMADEX) 20 MG tablet Take 1 tablet (20 mg total) by mouth daily as needed (for an overnight weight gain of 3 pounds or more). 90 tablet 3   No current facility-administered medications for this visit.    REVIEW OF SYSTEMS:  [X]  denotes positive finding, [ ]  denotes negative finding Cardiac  Comments:  Chest pain or chest pressure:    Shortness of breath upon exertion:    Short of breath when lying flat:    Irregular heart rhythm:        Vascular    Pain in calf, thigh, or hip brought on by ambulation:    Pain in feet at night that wakes you up from your sleep:     Blood clot in your veins:    Leg swelling:         Pulmonary    Oxygen at home:    Productive cough:     Wheezing:         Neurologic    Sudden weakness in arms or legs:     Sudden numbness in arms or legs:     Sudden onset of difficulty speaking or slurred speech:    Temporary loss of vision in one eye:     Problems with dizziness:  Gastrointestinal    Blood in stool:     Vomited blood:         Genitourinary    Burning when urinating:     Blood in urine:        Psychiatric    Major depression:         Hematologic    Bleeding problems:    Problems with blood clotting too easily:        Skin    Rashes or ulcers:        Constitutional    Fever or chills:      PHYSICAL  EXAM:     Vitals:   05/20/20 0832 05/20/20 0835  BP: (!) 155/90 (!) 159/91  Pulse: 68 68  Resp: 18   Temp: (!) 97.4 F (36.3 C)   TempSrc: Temporal   SpO2: 96%   Weight: 218 lb (98.9 kg)   Height: 5\' 7"  (1.702 m)     GENERAL: The patient is a well-nourished male, in no acute distress. The vital signs are documented above. CARDIAC: There is a regular rate and rhythm.  VASCULAR:  Palpable radial pulses both upper extremities Palpable femoral pulses bilaterally PULMONARY: No respiratory distress. ABDOMEN: Soft and non-tender. MUSCULOSKELETAL: There are no major deformities or cyanosis. NEUROLOGIC: No focal weakness or paresthesias are detected.  Cranial nerves II through XII grossly intact   DATA:   Carotid duplex 03/18/2020 shows bilateral high-grade stenosis greater than 80%  CTA neck shows bilateral high-grade carotid artery stenosis in the ICA.  Assessment/Plan:  65 year old male presents for evaluation of asymptomatic bilateral high-grade carotid artery ICA stenosis >80%.  I sent him for CTA neck after initial evaluation to evaluate for TCAR given bilateral high-grade stenosis with severe cardiac history.  The CTA neck did confirm greater than 80% bilateral ICA stenosis.  In review of the CTA, I think the left ICA has a higher-grade stenosis and he also had a slightly higher velocity in the left ICA on recent duplex.  I have recommended a left TCAR and we discussed plans for Monday 05/26/20.  I discussed that he needs to stay on aspirin, Crestor, and Brilinta.  We discussed risk and benefits including 1% risk of perioperative stroke in addition to risk of heart attack, risk of anesthesia, bledding, etc.  I have discussed with Dr. Einar Gip his cardiologist who feels he does not need any further cardiac evaluation at this time.   Martin Heck, MD [facility] Office: [phone]

## 2020-05-26 NOTE — Op Note (Signed)
Date: May 26, 2020  Preoperative diagnosis: Bilateral high-grade asymptomatic carotid artery stenosis greater than 80%  Postoperative diagnosis: Same  Procedure: 1.  Ultrasound-guided access of right common femoral vein for delivery of venous sheath 2. Left carotid angiogram 3.  Placement of left carotid artery stent with angioplasty after cutdown with percutaneous access and flow reversal for distal embolic protection (left TCAR)  Surgeon: Dr. [name], MD  Assistant: Dr. Ruta Hinds, MD  Indication: Patient is a 65 year old male who has a significant cardiac history and was seen in our office for evaluation of bilateral high-grade carotid artery stenosis.  Patient was worked up for [employer] with carotid artery stent placement given bilateral high-grade stenosis and significant surgical risk.  He presents today after risk benefits discussed with plans to initially treat the left lesion given that appeared to be tighter by velocity criteria and CTA.  An assistant was needed for exposure and expedite the case  Findings: Left internal carotid artery stenosis that measured greater than 80% over a length of 20 mm.  Cut down on the left common carotid artery just above the clavicle with placement of flow reversal sheath.  Ultimately the left ICA lesion was predilated with a 6 mm x 30 mm angioplasty balloon and then stented with a 8 mm x 40 mm Enroute stent.  There was no evidence of dissection with widely patent stent at completion.  Total flow reversal time was 11 minutes.  Anesthesia: General  Details: Patient was taken to the operating room after informed consent was obtained.  Placed on the operative table supine position and general endotracheal anesthesia was induced.  Ultimately a bump was placed under the neck and his head was rotated to the right and then the left neck as well as bilateral groins were prepped and draped in usual sterile fashion.  We performed incision timeout and  identified patient, procedure and site.  Initially the right common femoral vein was evaluated with ultrasound it was patent and image was saved.  This was accessed with a micro access needle and a microwire was placed with a micro sheath and ultimately Bentson wire and the venous sheath was placed in the right common femoral vein for transcarotid stent using TCAR. I then made a transverse incision 1 fingerbreadth above the left clavicle where the common carotid had been marked.  I then dissected through the platysma with Bovie cautery and used small wheatlander's for added visualization.  I then found the avascular plane between the heads of the sternocleidomastoid that were then dissected and then we ultimately visualized the internal jugular vein and the vagus nerve that were mobilized laterally using Metzenbaum scissors to isolate the common carotid that was controlled with a vessel loop.  I then placed a 5-0 Prolene U stitch in the anterior wall of the common carotid.  This was a very steep angulation given a deep artery but ultimately we were able to cannulate the U stitch with a micro access needle and placed a microwire and then a micro sheath.  We then got imaging with hand-injection to mark the carotid bifurcation.  Patient was given 100 units/kg IV heparin.  ACT was checked to maintain greater than 250.  We then used a stiff J-wire just below the carotid bifurcation to then place our arterial sheath for intervention.  The sheath was then secured with multiple 3-0 nylon's.  We then connected the filter flow reversal system to the arterial sheath in the left common carotid artery to  the venous sheath in the right groin.  We had good passive flow reversal.  Once we have confirmed our ACT and had done our TCAR timeout the left common carotid was clamped with a vessel loop.  I then proceeded with a microwire and our 6 mm x 30 mm angioplasty balloon and was able to cannulate the left internal carotid artery  crossed the lesion and then advance her balloon over the wire we then predilated the lesion to nominal pressure.  We then selected 8 mm x 40 mm Enroute stent that was then advanced over the wire and this was deployed in the proximal internal carotid artery across the bifurcation into the distal common carotid.  We had good deployment of the stent with no evidence of significant residual stenosis.  Satisfied with the results we went ahead and disconnected our filter system.  The arterial sheath in the common carotid artery was removed and I tied down the 5-0 Prolene Prolene U stitch with good hemostasis.  The common carotid artery was evaluated with Doppler and had good flow.  I then gave 50 mg protamine for reversal.  The venous sheath in the right groin was removed and manual pressure was held for 5 minutes with good hemostasis.  I washed out the neck incision and used Surgicel snow for hemostasis and then the platysma was closed with 3-0 Vicryl skin closed with 4-0 Monocryl.  He was awakened from anesthesia with no deficits.  Complication: None  Condition: Stable  [name], MD [facility] Office: [place]

## 2020-05-26 NOTE — Transfer of Care (Signed)
Immediate Anesthesia Transfer of Care Note  Patient: Brigido Mera  Procedure(s) Performed: LEFT TRANSCAROTID ARTERY REVASCULARIZATION (Left Neck) ULTRASOUND GUIDANCE FOR VASCULAR ACCESS (Right Groin)  Patient Location: PACU  Anesthesia Type:General  Level of Consciousness: awake, alert  and oriented  Airway & Oxygen Therapy: Patient Spontanous Breathing and Patient connected to face mask oxygen  Post-op Assessment: Report given to RN, Post -op Vital signs reviewed and stable, Patient moving all extremities X 4 and Patient able to stick tongue midline  Post vital signs: Reviewed and stable  Last Vitals:  Vitals Value Taken Time  BP 154/93 05/26/20 1229  Temp    Pulse 68 05/26/20 1236  Resp 11 05/26/20 1236  SpO2 95 % 05/26/20 1236  Vitals shown include unvalidated device data.  Last Pain:  Vitals:   05/26/20 0836  TempSrc:   PainSc: 0-No pain      Patients Stated Pain Goal: 3 (69/62/95 2841)  Complications: No complications documented.

## 2020-05-26 NOTE — Anesthesia Procedure Notes (Signed)
Procedure Name: Intubation Date/Time: 05/26/2020 10:38 AM Performed by: Inda Coke, CRNA Pre-anesthesia Checklist: Patient identified, Emergency Drugs available, Suction available and Patient being monitored Patient Re-evaluated:Patient Re-evaluated prior to induction Oxygen Delivery Method: Circle System Utilized Preoxygenation: Pre-oxygenation with 100% oxygen Induction Type: IV induction Ventilation: Mask ventilation without difficulty, Oral airway inserted - appropriate to patient size and Two handed mask ventilation required Laryngoscope Size: Mac and 4 Grade View: Grade I Tube type: Oral Tube size: 7.5 mm Number of attempts: 1 Airway Equipment and Method: Stylet and Oral airway Placement Confirmation: ETT inserted through vocal cords under direct vision,  positive ETCO2 and breath sounds checked- equal and bilateral Secured at: 22 cm Tube secured with: Tape Dental Injury: Teeth and Oropharynx as per pre-operative assessment

## 2020-05-26 NOTE — Progress Notes (Signed)
Pt arrived to rm 11 from PACU with level 1 hematoma ( which was circled by RN)on  His L neck. MD aware and present at bedside. Pt alert and oriented. VSS. CHG wipe provided. Call bell within reach. Metoprolol 10mg  x1 given per MD verbal order at the bedside.   Lavenia Atlas, RN

## 2020-05-26 NOTE — Anesthesia Postprocedure Evaluation (Signed)
Anesthesia Post Note  Patient: Martin Bush  Procedure(s) Performed: LEFT TRANSCAROTID ARTERY REVASCULARIZATION (Left Neck) ULTRASOUND GUIDANCE FOR VASCULAR ACCESS (Right Groin)     Patient location during evaluation: PACU Anesthesia Type: General Level of consciousness: awake and alert, patient cooperative and oriented Pain management: pain level controlled Vital Signs Assessment: post-procedure vital signs reviewed and stable Respiratory status: spontaneous breathing, nonlabored ventilation and respiratory function stable Cardiovascular status: blood pressure returned to baseline and stable Postop Assessment: no apparent nausea or vomiting Anesthetic complications: no   No complications documented.  Last Vitals:  Vitals:   05/26/20 1400 05/26/20 1431  BP: (!) 156/81 (!) 163/84  Pulse: 62 62  Resp: 12 18  Temp: 36.5 C 36.4 C  SpO2: 97% 97%    Last Pain:  Vitals:   05/26/20 1431  TempSrc: Oral  PainSc:                  Aima Mcwhirt,E. Gaylon Bentz

## 2020-05-27 ENCOUNTER — Encounter (HOSPITAL_COMMUNITY): Payer: Self-pay | Admitting: Vascular Surgery

## 2020-05-27 LAB — LIPID PANEL
Cholesterol: 117 mg/dL (ref 0–200)
HDL: 42 mg/dL (ref >40)
LDL Cholesterol: 60 mg/dL (ref 0–99)
Total CHOL/HDL Ratio: 2.8 RATIO
Triglycerides: 75 mg/dL (ref <150)
VLDL: 15 mg/dL (ref 0–40)

## 2020-05-27 LAB — BASIC METABOLIC PANEL
Anion gap: 9 (ref 5–15)
BUN: 26 mg/dL — ABNORMAL HIGH (ref 8–23)
CO2: 17 mmol/L — ABNORMAL LOW (ref 22–32)
Calcium: 8.7 mg/dL — ABNORMAL LOW (ref 8.9–10.3)
Chloride: 104 mmol/L (ref 98–111)
Creatinine, Ser: 1.44 mg/dL — ABNORMAL HIGH (ref 0.61–1.24)
GFR, Estimated: 54 mL/min — ABNORMAL LOW (ref >60)
Glucose, Bld: 207 mg/dL — ABNORMAL HIGH (ref 70–99)
Potassium: 4.6 mmol/L (ref 3.5–5.1)
Sodium: 130 mmol/L — ABNORMAL LOW (ref 135–145)

## 2020-05-27 LAB — CBC
HCT: 34.2 % — ABNORMAL LOW (ref 39.0–52.0)
Hemoglobin: 11.4 g/dL — ABNORMAL LOW (ref 13.0–17.0)
MCH: 29.8 pg (ref 26.0–34.0)
MCHC: 33.3 g/dL (ref 30.0–36.0)
MCV: 89.3 fL (ref 80.0–100.0)
Platelets: 194 10*3/uL (ref 150–400)
RBC: 3.83 MIL/uL — ABNORMAL LOW (ref 4.22–5.81)
RDW: 13.7 % (ref 11.5–15.5)
WBC: 12.5 10*3/uL — ABNORMAL HIGH (ref 4.0–10.5)
nRBC: 0 % (ref 0.0–0.2)

## 2020-05-27 LAB — GLUCOSE, CAPILLARY
Glucose-Capillary: 218 mg/dL — ABNORMAL HIGH (ref 70–99)
Glucose-Capillary: 222 mg/dL — ABNORMAL HIGH (ref 70–99)

## 2020-05-27 MED ORDER — OXYCODONE-ACETAMINOPHEN 5-325 MG PO TABS: 1.0000 | ORAL_TABLET | Freq: Four times a day (QID) | ORAL | 0 refills | Status: DC | PRN

## 2020-05-27 NOTE — Progress Notes (Signed)
Discharge instructions provided to patient. Medications, incision care and follow-up appointment reviewed. All questions answered. IVs removed. Pt to be escorted home by his wife.   Gailen Shelter RN

## 2020-05-27 NOTE — Progress Notes (Signed)
PHARMACIST LIPID MONITORING   Martin Bush is a 65 y.o. male admitted on 05/26/2020 for TCAR.  Pharmacy has been consulted to optimize lipid-lowering therapy with the indication of secondary prevention for clinical ASCVD.  Recent Labs:  Lipid Panel (last 6 months):   Lab Results  Component Value Date   CHOL 117 05/27/2020   TRIG 75 05/27/2020   HDL 42 05/27/2020   CHOLHDL 2.8 05/27/2020   VLDL 15 05/27/2020   LDLCALC 60 05/27/2020    Hepatic function panel (last 6 months):   Lab Results  Component Value Date   AST 16 05/23/2020   ALT 16 05/23/2020   ALKPHOS 145 (H) 05/23/2020   BILITOT 0.9 05/23/2020    SCr (since admission):   Serum creatinine: 1.44 mg/dL (H) 05/27/20 0500 Estimated creatinine clearance: 57.7 mL/min (A)  Current therapy and lipid therapy tolerance Current lipid-lowering therapy: Crestor 40 daily Previous lipid-lowering therapies (if applicable): n/a Documented or reported allergies or intolerances to lipid-lowering therapies (if applicable): none  Assessment:   Patient prefers no changes in lipid-lowering therapy at this time due to not needed  Plan:    1.Statin intensity (high intensity recommended for all patients regardless of the LDL):  No statin changes. The patient is already on a high intensity statin.  2.Add ezetimibe (if any one of the following):   Not indicated at this time.  3.Refer to lipid clinic:   No  4.Follow-up with:  Cardiology provider - None  5.Follow-up labs after discharge:  No changes in lipid therapy, repeat a lipid panel in one year.      Martin Bush, Martin Bush, BCCP Clinical Pharmacist  05/27/2020 11:01 AM   [facility] pharmacy phone numbers are listed on [email or web address]

## 2020-05-27 NOTE — Discharge Summary (Signed)
Discharge Summary     Martin Bush [birth year] 65 y.o. male  [id number]  Admission Date: 05/26/2020  Discharge Date: 06/03/2020  Physician: No att. providers found  Admission Diagnosis: Left carotid artery stenosis [I65.22]   HPI:   This is a 65 y.o. male with history of diabetes, coronary artery disease status post CABG as well as previous MI with ischemic cardiomyopathy and severe LV dysfunction, multiple previous PCI's, hypertension, hyperlipidemia that presents forfollow-up to discuss CTA neck forevaluation of bilateral carotid stenosis.As previously noted, hedenies any previous history of TIA or stroke. He is being followed by Dr. Jacinto Halim who is his cardiologist. Most recent ultrasound on 03/23/2020 showed high-grade bilateral ICA stenosis. On the right he had a velocity of 404/132 greater than 80% stenosis and on the left a velocity of 405/184 with greater than 80% stenosis. He is on aspirin and Brilinta. His wife state that he had 3 heart attacks at the end of last year. No previous neck surgery or neck radiation. He is on Brilinta. Very sedentary at home.  CTA neck on 05/12/2020 did confirm 80% stenosis of bilateral internal carotid arteries. He also has a chronic occlusion of the left vertebral artery. Reports no neurologic events since I last saw him  Hospital Course:  The patient was admitted to the hospital and taken to the operating room on 05/26/2020 and underwent: 1.  Ultrasound-guided access of right common femoral vein for delivery of venous sheath 2. Left carotid angiogram 3.  Placement of left carotid artery stent with angioplasty after cutdown with percutaneous access and flow reversal for distal embolic protection (left TCAR)   Findings: Left internal carotid artery stenosis that measured greater than 80% over a length of 20 mm.  Cut down on the left common carotid artery just above the clavicle with placement of flow reversal sheath.  Ultimately the  left ICA lesion was predilated with a 6 mm x 30 mm angioplasty balloon and then stented with a 8 mm x 40 mm Enroute stent.  There was no evidence of dissection with widely patent stent at completion.  Total flow reversal time was 11 minutes.  The pt tolerated the procedure well and was transported to the PACU in good condition.   By POD 1, the pt neuro status was in tact.  He was maintained on asa/statin and Brilinta.  He did have a fair amount of ecchymosis at the incision but overall looks fine with no significant hematoma.     CBC    Component Value Date/Time   WBC 12.5 (H) 05/27/2020 0500   RBC 3.83 (L) 05/27/2020 0500   HGB 11.4 (L) 05/27/2020 0500   HCT 34.2 (L) 05/27/2020 0500   PLT 194 05/27/2020 0500   MCV 89.3 05/27/2020 0500   MCH 29.8 05/27/2020 0500   MCHC 33.3 05/27/2020 0500   RDW 13.7 05/27/2020 0500   LYMPHSABS 0.5 (L) 12/09/2019 0400   MONOABS 0.9 12/09/2019 0400   EOSABS 0.0 12/09/2019 0400   BASOSABS 0.1 12/09/2019 0400   BMET    Component Value Date/Time   NA 130 (L) 05/27/2020 0500   NA 141 12/06/2019 1431   K 4.6 05/27/2020 0500   CL 104 05/27/2020 0500   CO2 17 (L) 05/27/2020 0500   GLUCOSE 207 (H) 05/27/2020 0500   BUN 26 (H) 05/27/2020 0500   BUN 17 12/06/2019 1431   CREATININE 1.44 (H) 05/27/2020 0500   CALCIUM 8.7 (L) 05/27/2020 0500   GFRNONAA 54 (L) 05/27/2020 0500  GFRAA 67 12/06/2019 1431     Discharge Instructions    Discharge patient   Complete by: As directed    Dc home after he has walked, voided and eaten.   Discharge disposition: 01-Home or Self Care   Discharge patient date: 05/27/2020      Discharge Diagnosis:  Left carotid artery stenosis [I65.22]  Secondary Diagnosis: Patient Active Problem List   Diagnosis Date Noted  . Left carotid artery stenosis 05/26/2020  . Elevated troponin   . Heart failure with reduced ejection fraction due to coronary artery disease ([place]) 12/09/2019  . Skin irritation 10/10/2019  . Acute  pulmonary edema ([place]) 10/09/2019  . Left main coronary artery disease   . Stenosis of left circumflex coronary artery   . Non-ST elevation (NSTEMI) myocardial infarction ([place]) 01/04/2019  . Hx of CABG 01/04/2019  . Acute respiratory failure ([place]) 01/04/2019  . 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 History of colonic polyps 11/12/2018  . Abnormal colonoscopy 11/12/2018  . Ischemic cardiomyopathy 09/27/2018  . Encounter for assessment of implantable cardioverter-defibrillator (ICD) 09/27/2018  . Enrolled in clinical trial of drug 06/13/2018  . Weight loss 09/19/2017  . Ventricular tachycardia ([place])   . ICD (implantable cardioverter-defibrillator) discharge 08/02/2017  . Obesity 06/17/2017  . CHF (congestive heart failure) ([place]) 07/23/2015  . Hyperlipidemia 07/11/2015  . HTN (hypertension), benign 07/11/2015  . Coronary artery disease involving autologous artery coronary bypass graft without angina pectoris 07/11/2015  . Depression 07/11/2015  . CKD (chronic kidney disease) stage 3, GFR 30-59 ml/min (HCC) 07/11/2015  . OSA on CPAP 07/11/2015  . Lightheadedness 07/11/2015  . Carotid artery stenosis   . S/P eye surgery 05/15/2015  . Injury of left toe 04/27/2015  . Chronic low back pain 02/13/2015  . Insulin dependent type 2 diabetes mellitus ([place]) 02/13/2015  . Dizziness 02/05/2015  . ICD; Biventricular  Medtronic ICD Amplia MRI QWuad CRT-D  in situ 10/29/14 10/29/2014   Past Medical History:  Diagnosis Date  . AICD (automatic cardioverter/defibrillator) present    Medtronic  . Arthritis    "thumbs"  (08/02/2017)  . CHF (congestive heart failure) ([place])   . Chronic combined systolic and diastolic heart failure ([place])    a. 08/2017 Echo: EF 20-25%, mod glob HK. Sev distal ant sept, inflat HK. Apical AK. Gr2 DD. Mildly reduced RV fxn.  . Colon polyps   . Coronary artery disease    a. s/p CABG x 2 (LIMA->LAD, VG->OM); b. Multiple PCI's to LM/LCX/OM; c. 07/2017 PTCA of LM/LCX w/  early ISR-->repeat PCI/DES to LM (3.5x20 Synergy DES) and LCX (3.0x20 Synergy DES); d. 07/2018 Relook Cath: LM patent stent, LAD 100ost, LCX patent stent, OM1 99/60, LIMA->LAD ok. VG->dLCX 24 (old).  . Depression   . Encounter for assessment of implantable cardioverter-defibrillator (ICD) 09/27/2018  . High cholesterol   . History of hiatal hernia   . Hypertension   . ICD; Biventricular  Medtronic ICD Amplia MRI QWuad CRT-D  in situ 10/29/14 10/29/2014   Remote ICD check 09.23.20:  One 6 beat NSVT. No therapy.  1 SVT episode @ 130 bpm (38 Sec).  There were 23 Vent sense episodes detected for up to 1.1 min/day (AT). Health trends (patient activity, heart rate variability, average heart rates) are stable.Trans-thoracic impedance trends and the OptiVol Fluid Index do no present significant abnormalities. Battery longevity is 4.3 years. RA pacing is 3  . Ischemic cardiomyopathy 09/27/2018  . MI (myocardial infarction) ([place]) 2003  . NSTEMI (non-ST elevated myocardial infarction) (  [place]) 07/16/2014  . OSA on CPAP   . Oxygen deficiency   . Peripheral vascular disease ([place])   . Pneumonia 10/2014  . Proteinuria   . Sleep apnea   . Type II diabetes mellitus (HCC)    insulin dependent    Allergies as of 05/27/2020      Reactions   Diltiazem Rash      Medication List    TAKE these medications   amiodarone 200 MG tablet Commonly known as: PACERONE Take 1 tablet (200 mg total) by mouth daily.   aspirin 81 MG EC tablet Take 1 tablet (81 mg total) by mouth daily.   carvedilol 12.5 MG tablet Commonly known as: COREG Take 2 tablets (25 mg total) by mouth 2 (two) times daily. What changed: how much to take   Cyanocobalamin 2500 MCG Chew Chew 2,500 mcg by mouth daily.   FISH OIL PO Take 2 capsules by mouth 3 (three) times daily. 500 mg /100 mg   insulin aspart protamine- aspart (70-30) 100 UNIT/ML injection Commonly known as: NOVOLOG MIX 70/30 Inject 0.66 mLs (66 Units total) into the skin daily  with breakfast AND 0.6 mLs (60 Units total) daily with supper. What changed: See the new instructions.   metFORMIN 500 MG 24 hr tablet Commonly known as: GLUCOPHAGE-XR Take 500 mg by mouth 2 (two) times daily.   nitroGLYCERIN 0.4 MG SL tablet Commonly known as: NITROSTAT Place 1 tablet (0.4 mg total) under the tongue every 5 (five) minutes x 3 doses as needed for chest pain.   oxyCODONE-acetaminophen 5-325 MG tablet Commonly known as: Percocet Take 1 tablet by mouth every 6 (six) hours as needed for severe pain.   OXYGEN Inhale 2 L/min into the lungs See admin instructions. 2 L/min at bedtime in conjunction with CPAP   potassium chloride SA 20 MEQ tablet Commonly known as: KLOR-CON Take 1 tablet (20 mEq total) by mouth 2 (two) times daily.   PRESCRIPTION MEDICATION Inhale into the lungs See admin instructions. CPAP- At bedtime   rosuvastatin 40 MG tablet Commonly known as: CRESTOR Take 1 tablet (40 mg total) by mouth at bedtime.   sacubitril-valsartan 97-103 MG Commonly known as: ENTRESTO Take 1 tablet by mouth 2 (two) times daily.   Semaglutide (1 MG/DOSE) 2 MG/1.5ML Sopn Inject 1 mg into the skin once a week.   sertraline 100 MG tablet Commonly known as: ZOLOFT Take 50 mg by mouth daily.   ticagrelor 90 MG Tabs tablet Commonly known as: BRILINTA Take 1 tablet (90 mg total) by mouth 2 (two) times daily.   torsemide 20 MG tablet Commonly known as: DEMADEX Take 1 tablet (20 mg total) by mouth daily as needed (for an overnight weight gain of 3 pounds or more).   Vitamin D3 50 MCG (2000 UT) Tabs Take 2,000 Units by mouth 2 (two) times daily.        [facility] Discharge Instructions Carotid Endarterectomy (CEA)  Please refer to the following instructions for your post-procedure care. Your surgeon or physician assistant will discuss any changes with you.  Activity  You are encouraged to walk as much as you can. You can slowly  return to normal activities but must avoid strenuous activity and heavy lifting until your doctor tell you it's OK. Avoid activities such as vacuuming or swinging a golf club. You can drive after one week if you are comfortable and you are no longer taking prescription pain medications. It is normal to feel  tired for serval weeks after your surgery. It is also normal to have difficulty with sleep habits, eating, and bowel movements after surgery. These will go away with time.  Bathing/Showering  You may shower after you come home. Do not soak in a bathtub, hot tub, or swim until the incision heals completely.  Incision Care  Shower every day. Clean your incision with mild soap and water. Pat the area dry with a clean towel. You do not need a bandage unless otherwise instructed. Do not apply any ointments or creams to your incision. You may have skin glue on your incision. Do not peel it off. It will come off on its own in about one week. Your incision may feel thickened and raised for several weeks after your surgery. This is normal and the skin will soften over time. For Men Only: It's OK to shave around the incision but do not shave the incision itself for 2 weeks. It is common to have numbness under your chin that could last for several months.  Diet  Resume your normal diet. There are no special food restrictions following this procedure. A low fat/low cholesterol diet is recommended for all patients with vascular disease. In order to heal from your surgery, it is CRITICAL to get adequate nutrition. Your body requires vitamins, minerals, and protein. Vegetables are the best source of vitamins and minerals. Vegetables also provide the perfect balance of protein. Processed food has little nutritional value, so try to avoid this.  Medications  Resume taking all of your medications unless your doctor or physician assistant tells you not to.  If your incision is causing pain, you may take over-the-  counter pain relievers such as acetaminophen (Tylenol). If you were prescribed a stronger pain medication, please be aware these medications can cause nausea and constipation.  Prevent nausea by taking the medication with a snack or meal. Avoid constipation by drinking plenty of fluids and eating foods with a high amount of fiber, such as fruits, vegetables, and grains.  Do not take Tylenol if you are taking prescription pain medications.  Follow Up  Our office will schedule a follow up appointment 2-3 weeks following discharge.  Please call us immediately for any of the following conditions  . Increased pain, redness, drainage (pus) from your incision site. . Fever of 101 degrees or higher. . If you should develop stroke (slurred speech, difficulty swallowing, weakness on one side of your body, loss of vision) you should call 911 and go to the nearest emergency room. .  Reduce your risk of vascular disease:  . Stop smoking. If you would like help call [facility] at 1-800-QUIT-NOW (904)206-1905) or [place] at 575-156-2832. . Manage your cholesterol . Maintain a desired weight . Control your diabetes . Keep your blood pressure down .  If you have any questions, please call the office at [phone].  Prescriptions given: 1.   Roxicet #8 No Refill  Disposition: home  Patient's condition: is Good  Follow up: 1. [facility]  in 4 weeks with duplex   Leontine Locket, PA-C [facility] [phone]   --- For [facility] Registry use ---   Modified Rankin score at D/C (0-6): 0  IV medication needed for:  1. Hypertension: No 2. Hypotension: No  Post-op Complications: No  1. Post-op CVA or TIA: No  If yes: Event classification (right eye, left eye, right cortical, left cortical, verterobasilar, other): n/a  If yes: Timing of event (intra-op, <6 hrs post-op, >=6 hrs  post-op, unknown): n/a  2. CN injury: No  If yes: CN n/a injuried   3. Myocardial infarction:  No  If yes: Dx by (EKG or clinical, Troponin): n/a  4.  CHF: No  5.  Dysrhythmia (new): No  6. Wound infection: No  7. Reperfusion symptoms: No  8. Return to OR: No  If yes: return to OR for (bleeding, neurologic, other CEA incision, other): n/a  Discharge medications: Statin use:  Yes ASA use:  Yes   Beta blocker use:  Yes ACE-Inhibitor use:  No  ARB use:  Yes CCB use: No P2Y12 Antagonist use: Yes, [ ]  Plavix, [ ]  Plasugrel, [ ]  Ticlopinine, [  x] Ticagrelor, [ ]  Other, [ ]  No for medical reason, [ ]  Non-compliant, [ ]  Not-indicated Anti-coagulant use:  No, [ ]  Warfarin, [ ]  Rivaroxaban, [ ]  Dabigatran,

## 2020-05-27 NOTE — Progress Notes (Addendum)
  Progress Note    05/27/2020 7:25 AM 1 Day Post-Op  Subjective:  No complaints; denies trouble swallowing  Afebrile HR 60's-70's  683'M-196'Q systolic 22% RA  Vitals:   05/27/20 0400 05/27/20 0446  BP: 126/69 (!) 117/52  Pulse:  68  Resp: 17 17  Temp:  97.7 F (36.5 C)  SpO2: 95% 95%     Physical Exam: Neuro:  In tact; tongue is midline Lungs:  Non labored Incision:  Clean with some ecchymosis; right groin soft  CBC    Component Value Date/Time   WBC 12.5 (H) 05/27/2020 0500   RBC 3.83 (L) 05/27/2020 0500   HGB 11.4 (L) 05/27/2020 0500   HCT 34.2 (L) 05/27/2020 0500   PLT 194 05/27/2020 0500   MCV 89.3 05/27/2020 0500   MCH 29.8 05/27/2020 0500   MCHC 33.3 05/27/2020 0500   RDW 13.7 05/27/2020 0500   LYMPHSABS 0.5 (L) 12/09/2019 0400   MONOABS 0.9 12/09/2019 0400   EOSABS 0.0 12/09/2019 0400   BASOSABS 0.1 12/09/2019 0400    BMET    Component Value Date/Time   NA 130 (L) 05/27/2020 0500   NA 141 12/06/2019 1431   K 4.6 05/27/2020 0500   CL 104 05/27/2020 0500   CO2 17 (L) 05/27/2020 0500   GLUCOSE 207 (H) 05/27/2020 0500   BUN 26 (H) 05/27/2020 0500   BUN 17 12/06/2019 1431   CREATININE 1.44 (H) 05/27/2020 0500   CALCIUM 8.7 (L) 05/27/2020 0500   GFRNONAA 54 (L) 05/27/2020 0500   GFRAA 67 12/06/2019 1431     Intake/Output Summary (Last 24 hours) at 05/27/2020 0725 Last data filed at 05/27/2020 9798 Gross per 24 hour  Intake 700 ml  Output 775 ml  Net -75 ml     Assessment/Plan:  This is a 65 y.o. male who is s/p left TCAR  1 Day Post-Op  -pt is doing well this am. -pt neuro exam is in tact -pt has not ambulated-needs to walk -pt has voided -f/u with VVS in 4 weeks with duplex   Leontine Locket, PA-C [facility] [phone]   I have seen and evaluated the patient. I agree with the PA note as documented above.  Postop day 1 status post left carotid stent with TCAR for high-grade asymptomatic stenosis.  Neurologically  intact this morning.  Has a fair amount of ecchymosis at the incision but overall looks fine with no significant hematoma.  Plan discharge today on aspirin statin Brilinta.  We will arrange follow-up with me in 3 to 4 weeks with a left carotid ultrasound to evaluate stent and then will need right-sided intervention for contralateral high-grade stenosis.  [name], MD [facility] [place] Office: [phone]

## 2020-05-30 ENCOUNTER — Other Ambulatory Visit: Payer: Self-pay

## 2020-05-30 DIAGNOSIS — I6523 Occlusion and stenosis of bilateral carotid arteries: Secondary | ICD-10-CM

## 2020-06-06 DIAGNOSIS — I5043 Acute on chronic combined systolic (congestive) and diastolic (congestive) heart failure: Secondary | ICD-10-CM | POA: Diagnosis not present

## 2020-06-24 ENCOUNTER — Other Ambulatory Visit: Payer: Self-pay

## 2020-06-24 ENCOUNTER — Ambulatory Visit (HOSPITAL_COMMUNITY)
Admission: RE | Admit: 2020-06-24 | Discharge: 2020-06-24 | Disposition: A | Discharge status: Home / Self Care | Payer: No Typology Code available for payment source | Source: Ambulatory Visit | Attending: Vascular Surgery | Admitting: Vascular Surgery

## 2020-06-24 ENCOUNTER — Ambulatory Visit (INDEPENDENT_AMBULATORY_CARE_PROVIDER_SITE_OTHER): Payer: No Typology Code available for payment source | Admitting: Vascular Surgery

## 2020-06-24 ENCOUNTER — Encounter: Payer: Self-pay | Admitting: Vascular Surgery

## 2020-06-24 VITALS — BP 153/89 | HR 63 | Temp 97.4°F | Ht 67.0 in | Wt 220.5 lb

## 2020-06-24 DIAGNOSIS — I6523 Occlusion and stenosis of bilateral carotid arteries: Secondary | ICD-10-CM | POA: Diagnosis not present

## 2020-06-24 MED ORDER — OXYCODONE-ACETAMINOPHEN 5-325 MG PO TABS: 1.0000 | ORAL_TABLET | Freq: Four times a day (QID) | ORAL | 0 refills | Status: DC | PRN

## 2020-06-24 NOTE — Progress Notes (Signed)
Patient name: Martin Bush MRN: [id number] DOB: March 04, 1955 Sex: male  REASON FOR CONSULT: Post-op check after left TCAR  HPI: Martin Bush is a 65 y.o. male, with history of diabetes, coronary artery disease status post CABG as well as previous MI with ischemic cardiomyopathy and severe LV dysfunction, multiple previous PCI's, hypertension, hyperlipidemia that presents for postop check after left TCAR.  He was initially seen with bilateral high-grade carotid artery stenosis that was asymptomatic and referred by Dr. Einar Gip.  His left TCAR was on 05/26/20.  He has done well since surgery.  He describes no neurologic events since discharge.  Remains on aspirin Brilinta and statin.  No concerns today.  Past Medical History:  Diagnosis Date  . AICD (automatic cardioverter/defibrillator) present    Medtronic  . Arthritis    "thumbs"  (08/02/2017)  . CHF (congestive heart failure) ([place])   . Chronic combined systolic and diastolic heart failure ([place])    a. 08/2017 Echo: EF 20-25%, mod glob HK. Sev distal ant sept, inflat HK. Apical AK. Gr2 DD. Mildly reduced RV fxn.  . Colon polyps   . Coronary artery disease    a. s/p CABG x 2 (LIMA->LAD, VG->OM); b. Multiple PCI's to LM/LCX/OM; c. 07/2017 PTCA of LM/LCX w/ early ISR-->repeat PCI/DES to LM (3.5x20 Synergy DES) and LCX (3.0x20 Synergy DES); d. 07/2018 Relook Cath: LM patent stent, LAD 100ost, LCX patent stent, OM1 99/60, LIMA->LAD ok. VG->dLCX 38 (old).  . Depression   . Encounter for assessment of implantable cardioverter-defibrillator (ICD) 09/27/2018  . High cholesterol   . History of hiatal hernia   . Hypertension   . ICD; Biventricular  Medtronic ICD Amplia MRI QWuad CRT-D  in situ 10/29/14 10/29/2014   Remote ICD check 09.23.20:  One 6 beat NSVT. No therapy.  1 SVT episode @ 130 bpm (38 Sec).  There were 23 Vent sense episodes detected for up to 1.1 min/day (AT). Health trends (patient activity, heart rate variability, average heart  rates) are stable.Trans-thoracic impedance trends and the OptiVol Fluid Index do no present significant abnormalities. Battery longevity is 4.3 years. RA pacing is 3  . Ischemic cardiomyopathy 09/27/2018  . MI (myocardial infarction) ([place]) 2003  . NSTEMI (non-ST elevated myocardial infarction) ([place]) 07/16/2014  . OSA on CPAP   . Oxygen deficiency   . Peripheral vascular disease ([place])   . Pneumonia 10/2014  . Proteinuria   . Sleep apnea   . Type II diabetes mellitus (H[facility])    insulin dependent    Past Surgical History:  Procedure Laterality Date  . BIOPSY  09/19/2018   Procedure: BIOPSY;  Surgeon: Thornton Park, MD;  [facility];  Service: Gastroenterology;;  . CARDIAC CATHETERIZATION N/A 07/16/2014   Procedure: Left Heart Cath and Coronary Angiography;  Surgeon: Adrian Prows, MD;  Location: [place] [facility];  Service: Cardiovascular;  Laterality: N/A;  . CARDIAC CATHETERIZATION  07/16/2014   Procedure: Coronary Balloon Angioplasty;  Surgeon: Adrian Prows, MD;  Location: [place] [facility];  Service: Cardiovascular;;  . CARDIAC CATHETERIZATION  2003  . CARDIAC DEFIBRILLATOR PLACEMENT  2016  . CATARACT EXTRACTION W/ INTRAOCULAR LENS IMPLANT Left 03/2014  . COLONOSCOPY    . COLONOSCOPY WITH PROPOFOL N/A 09/19/2018   Procedure: COLONOSCOPY WITH PROPOFOL;  Surgeon: Thornton Park, MD;  [facility];  Service: Gastroenterology;  Laterality: N/A;  . CORONARY ANGIOPLASTY WITH STENT PLACEMENT     "I've got a total of 8 stents in there; mostly doine at [facility]  [place]" (08/02/2017)  . CORONARY ARTERY BYPASS GRAFT  ~ 2003   "CABG X2"; [facility]  . CORONARY ATHERECTOMY N/A 08/16/2017   Procedure: CORONARY ATHERECTOMY;  Surgeon: Nigel Mormon, MD;  Location: [place] [facility];  Service: Cardiovascular;  Laterality: N/A;  . CORONARY BALLOON ANGIOPLASTY N/A 08/04/2017   Procedure: CORONARY BALLOON ANGIOPLASTY;  Surgeon: Adrian Prows, MD;  Location: [place] [facility];   Service: Cardiovascular;  Laterality: N/A;  . CORONARY BALLOON ANGIOPLASTY N/A 01/11/2019   Procedure: CORONARY BALLOON ANGIOPLASTY;  Surgeon: Adrian Prows, MD;  Location: [place] [facility];  Service: Cardiovascular;  Laterality: N/A;  . CORONARY BALLOON ANGIOPLASTY N/A 10/09/2019   Procedure: CORONARY BALLOON ANGIOPLASTY;  Surgeon: Adrian Prows, MD;  Location: [place] [facility];  Service: Cardiovascular;  Laterality: N/A;  . CORONARY STENT INTERVENTION N/A 08/16/2017   Procedure: CORONARY STENT INTERVENTION;  Surgeon: Nigel Mormon, MD;  Location: [place] [facility];  Service: Cardiovascular;  Laterality: N/A;  . CORONARY STENT INTERVENTION N/A 10/09/2019   Procedure: CORONARY STENT INTERVENTION;  Surgeon: Adrian Prows, MD;  Location: [place] [facility];  Service: Cardiovascular;  Laterality: N/A;  . ELBOW SURGERY Left ?2001   "pinched nerve"  . ENDOSCOPIC MUCOSAL RESECTION  12/18/2018   Procedure: ENDOSCOPIC MUCOSAL RESECTION;  Surgeon: Rush Landmark Telford Nab., MD;  Location: [place];  Service: Gastroenterology;;  . Otho Darner SIGMOIDOSCOPY N/A 12/18/2018   Procedure: Beryle Quant;  Surgeon: Irving Copas., MD;  Location: [place];  Service: Gastroenterology;  Laterality: N/A;  . HEMOSTASIS CLIP PLACEMENT  12/18/2018   Procedure: HEMOSTASIS CLIP PLACEMENT;  Surgeon: Irving Copas., MD;  Location: [place];  Service: Gastroenterology;;  . LEFT HEART CATH AND CORONARY ANGIOGRAPHY N/A 01/11/2019   Procedure: LEFT HEART CATH AND CORONARY ANGIOGRAPHY;  Surgeon: Adrian Prows, MD;  Location: [place] [facility];  Service: Cardiovascular;  Laterality: N/A;  . LEFT HEART CATH AND CORS/GRAFTS ANGIOGRAPHY N/A 08/04/2017   Procedure: LEFT HEART CATH AND CORS/GRAFTS ANGIOGRAPHY;  Surgeon: Adrian Prows, MD;  Location: [place] [facility];  Service: Cardiovascular;  Laterality: N/A;  . LEFT HEART CATH AND CORS/GRAFTS ANGIOGRAPHY N/A 08/20/2017   Procedure: LEFT HEART CATH AND  CORS/GRAFTS ANGIOGRAPHY;  Surgeon: Nigel Mormon, MD;  Location: [place] [facility];  Service: Cardiovascular;  Laterality: N/A;  . LEFT HEART CATH AND CORS/GRAFTS ANGIOGRAPHY N/A 01/08/2019   Procedure: LEFT HEART CATH AND CORS/GRAFTS ANGIOGRAPHY;  Surgeon: [place] Hampshire, MD;  Location: [place] [facility];  Service: Cardiovascular;  Laterality: N/A;  . LEFT HEART CATH AND CORS/GRAFTS ANGIOGRAPHY N/A 10/09/2019   Procedure: LEFT HEART CATH AND CORS/GRAFTS ANGIOGRAPHY;  Surgeon: Adrian Prows, MD;  Location: [place] [facility];  Service: Cardiovascular;  Laterality: N/A;  . LEFT HEART CATH AND CORS/GRAFTS ANGIOGRAPHY N/A 12/11/2019   Procedure: LEFT HEART CATH AND CORS/GRAFTS ANGIOGRAPHY;  Surgeon: Nigel Mormon, MD;  Location: [place] [facility];  Service: Cardiovascular;  Laterality: N/A;  . POLYPECTOMY  09/19/2018   Procedure: POLYPECTOMY;  Surgeon: Thornton Park, MD;  [facility];  Service: Gastroenterology;;  . Lia Foyer INJECTION  09/19/2018   Procedure: SUBMUCOSAL INJECTION;  Surgeon: Thornton Park, MD;  [facility];  Service: Gastroenterology;;  . Lia Foyer LIFTING INJECTION  12/18/2018   Procedure: SUBMUCOSAL LIFTING INJECTION;  Surgeon: Irving Copas., MD;  Location: [place];  Service: Gastroenterology;;  . Dorthula Perfect ARTERY REVASCULARIZATION Left 05/26/2020   Procedure: LEFT TRANSCAROTID ARTERY REVASCULARIZATION;  Surgeon: [name], MD;  Location[facility]  OR;  Service: Vascular;  Laterality: Left;  . ULTRASOUND GUIDANCE FOR VASCULAR ACCESS Right 05/26/2020   Procedure: ULTRASOUND GUIDANCE FOR VASCULAR ACCESS;  Surgeon: [name], MD;  Location: [facility] OR;  Service: Vascular;  Laterality: Right;    Family History  Problem Relation Age of Onset  . Uterine cancer Mother   . Lung cancer Mother   . Hyperlipidemia Father   . Heart disease Father   . Hypertension Father   . Diabetes Father   . Colon cancer Neg Hx      SOCIAL HISTORY: Social History   Socioeconomic History  . Marital status: Married    Spouse name: Not on file  . Number of children: 3  . Years of education: Not on file  . Highest education level: Not on file  Occupational History  . Not on file  Tobacco Use  . Smoking status: Former Smoker    Packs/day: 0.25    Years: 27.00    Pack years: 6.75    Types: Cigars    Quit date: 2002    Years since quitting: 20.4  . Smokeless tobacco: Former Systems developer    Types: [place] date: 2002  Media planner  . Vaping Use: Never used  Substance and Sexual Activity  . Alcohol use: Not Currently  . Drug use: Never  . Sexual activity: Not Currently  Other Topics Concern  . Not on file  Social History Narrative  . Not on file   Social Determinants of Health   Financial Resource Strain: Low Risk   . Difficulty of Paying Living Expenses: Not hard at all  Food Insecurity: No Food Insecurity  . Worried About Charity fundraiser in the Last Year: Never true  . Ran Out of Food in the Last Year: Never true  Transportation Needs: No Transportation Needs  . Lack of Transportation (Medical): No  . Lack of Transportation (Non-Medical): No  Physical Activity: Not on file  Stress: No Stress Concern Present  . Feeling of Stress : Not at all  Social Connections: Not on file  Intimate Partner Violence: Not At Risk  . Fear of Current or Ex-Partner: No  . Emotionally Abused: No  . Physically Abused: No  . Sexually Abused: No    Allergies  Allergen Reactions  . Diltiazem Rash    Current Outpatient Medications  Medication Sig Dispense Refill  . amiodarone (PACERONE) 200 MG tablet Take 1 tablet (200 mg total) by mouth daily.    [name] Kitchen aspirin EC 81 MG EC tablet Take 1 tablet (81 mg total) by mouth daily.    . Cholecalciferol (VITAMIN D3) 50 MCG (2000 UT) TABS Take 2,000 Units by mouth 2 (two) times daily.     . Cyanocobalamin 2500 MCG CHEW Chew 2,500 mcg by mouth daily.     . insulin aspart  protamine- aspart (NOVOLOG MIX 70/30) (70-30) 100 UNIT/ML injection Inject 0.66 mLs (66 Units total) into the skin daily with breakfast AND 0.6 mLs (60 Units total) daily with supper. (Patient taking differently: Take as needed of blood glucose is over 130 take 60 units) 10 mL 11  . metFORMIN (GLUCOPHAGE-XR) 500 MG 24 hr tablet Take 500 mg by mouth 2 (two) times daily.    . nitroGLYCERIN (NITROSTAT) 0.4 MG SL tablet Place 1 tablet (0.4 mg total) under the tongue every 5 (five) minutes x 3 doses as needed for chest pain. 25 tablet 3  . Omega-3 Fatty Acids (FISH OIL PO) Take 2  capsules by mouth 3 (three) times daily. 500 mg /100 mg    . oxyCODONE-acetaminophen (PERCOCET) 5-325 MG tablet Take 1 tablet by mouth every 6 (six) hours as needed for severe pain. 8 tablet 0  . OXYGEN Inhale 2 L/min into the lungs See admin instructions. 2 L/min at bedtime in conjunction with CPAP    . potassium chloride SA (KLOR-CON) 20 MEQ tablet Take 1 tablet (20 mEq total) by mouth 2 (two) times daily. 180 tablet 3  . PRESCRIPTION MEDICATION Inhale into the lungs See admin instructions. CPAP- At bedtime    . rosuvastatin (CRESTOR) 40 MG tablet Take 1 tablet (40 mg total) by mouth at bedtime.    . sacubitril-valsartan (ENTRESTO) 97-103 MG Take 1 tablet by mouth 2 (two) times daily. 60 tablet   . Semaglutide, 1 MG/DOSE, 2 MG/1.5ML SOPN Inject 1 mg into the skin once a week.    . sertraline (ZOLOFT) 100 MG tablet Take 50 mg by mouth daily.    . ticagrelor (BRILINTA) 90 MG TABS tablet Take 1 tablet (90 mg total) by mouth 2 (two) times daily. 60 tablet   . torsemide (DEMADEX) 20 MG tablet Take 1 tablet (20 mg total) by mouth daily as needed (for an overnight weight gain of 3 pounds or more). 90 tablet 3  . carvedilol (COREG) 12.5 MG tablet Take 2 tablets (25 mg total) by mouth 2 (two) times daily. (Patient taking differently: Take 12.5 mg by mouth 2 (two) times daily.) 180 tablet 3   No current facility-administered medications  for this visit.    REVIEW OF SYSTEMS:  [X]  denotes positive finding, [ ]  denotes negative finding Cardiac  Comments:  Chest pain or chest pressure:    Shortness of breath upon exertion:    Short of breath when lying flat:    Irregular heart rhythm:        Vascular    Pain in calf, thigh, or hip brought on by ambulation:    Pain in feet at night that wakes you up from your sleep:     Blood clot in your veins:    Leg swelling:         Pulmonary    Oxygen at home:    Productive cough:     Wheezing:         Neurologic    Sudden weakness in arms or legs:     Sudden numbness in arms or legs:     Sudden onset of difficulty speaking or slurred speech:    Temporary loss of vision in one eye:     Problems with dizziness:         Gastrointestinal    Blood in stool:     Vomited blood:         Genitourinary    Burning when urinating:     Blood in urine:        Psychiatric    Major depression:         Hematologic    Bleeding problems:    Problems with blood clotting too easily:        Skin    Rashes or ulcers:        Constitutional    Fever or chills:      PHYSICAL EXAM: Vitals:   06/24/20 1025  BP: (!) 153/89  Pulse: 63  Temp: (!) 97.4 F (36.3 C)  TempSrc: Skin  SpO2: 96%  Weight: 220 lb 8 oz (100 kg)  Height: 5\' 7"  (1.702  m)    GENERAL: The patient is a well-nourished male, in no acute distress. The vital signs are documented above. CARDIAC: There is a regular rate and rhythm.  VASCULAR:  Left neck incision c/d/i NEUROLOGIC: No focal weakness or paresthesias are detected.  Cranial nerves II through XII grossly intact   DATA:   CTA neck 05/12/20 shows bilateral high-grade carotid artery stenosis in the ICA.  Carotid duplex today shows widely patent left carotid stent.  Assessment/Plan:  65 year old male presents for postop check after left TCAR for an asymptomatic high-grade greater than 80% stenosis performed on 05/26/20.  He has done very well since  surgery and is recovering appropriately.  Carotid duplex today shows a widely patent left ICA stent.  He has known contralateral high-grade stenosis on the right that is asymptomatic as well.  We have already sized him for a right TCAR based on his previous CT scan.  We will get him scheduled for right TCAR in June for asymptomatic high-grade stenosis on the right.  Discussed he remain on aspirin statin and Brilinta.  Risk benefits again reviewed.   [name], MD [facility] [place] Office: [phone]

## 2020-06-25 DIAGNOSIS — I5020 Unspecified systolic (congestive) heart failure: Secondary | ICD-10-CM | POA: Diagnosis not present

## 2020-06-29 DIAGNOSIS — I5042 Chronic combined systolic (congestive) and diastolic (congestive) heart failure: Secondary | ICD-10-CM | POA: Diagnosis not present

## 2020-06-29 DIAGNOSIS — Z95810 Presence of automatic (implantable) cardiac defibrillator: Secondary | ICD-10-CM | POA: Diagnosis not present

## 2020-06-29 DIAGNOSIS — Z4502 Encounter for adjustment and management of automatic implantable cardiac defibrillator: Secondary | ICD-10-CM | POA: Diagnosis not present

## 2020-07-01 NOTE — Pre-Procedure Instructions (Signed)
Surgical Instructions:    Your procedure is scheduled on Monday, June 13th (07:30 AM- 09:32 AM).  Report to [facility] Main Entrance "A" at 05:30 A.M., then check in with the Admitting office.  Call this number if you have any questions prior to, or have any problems the morning of surgery:  [phone]    Remember:  Do not eat or drink after midnight the night before your surgery.     Take these medicines the morning of surgery with A SIP OF WATER: amiodarone (PACERONE) carvedilol (COREG) sertraline (ZOLOFT)  IF NEEDED: nitroGLYCERIN (NITROSTAT)   *Follow your surgeon's instructions concerning Aspirin and ticagrelor (BRILINTA).  If no instructions were given by your surgeon then you will need to call the office to get those instructions.     As of today, STOP taking any NSAIDs such as Aleve, Naproxen, Ibuprofen, Motrin, Advil, Goody's, BC's, all herbal medications, fish oil, and all vitamins.           WHAT DO I DO ABOUT MY DIABETES MEDICATION?  THE NIGHT BEFORE SURGERY: . Take 70% of your usual dinner dose (21-44 units) of (NOVOLIN 70/30) insulin.       THE MORNING OF SURGERY: . Do NOT take (NOVOLIN 70/30) insulin.  Do not take oral diabetes medicines (pills)- metFORMIN (GLUCOPHAGE-XR). . Do not take other diabetes injectables, including Semaglutide (Ozempic).    HOW TO MANAGE YOUR DIABETES BEFORE AND AFTER SURGERY  Why is it important to control my blood sugar before and after surgery? . Improving blood sugar levels before and after surgery helps healing and can limit problems. . A way of improving blood sugar control is eating a healthy diet by: o  Eating less sugar and carbohydrates o  Increasing activity/exercise o  Talking with your doctor about reaching your blood sugar goals . High blood sugars (greater than 180 mg/dL) can raise your risk of infections and slow your recovery, so you will need to focus on controlling your diabetes during the weeks before  surgery. . Make sure that the doctor who takes care of your diabetes knows about your planned surgery including the date and location.  How do I manage my blood sugar before surgery? . Check your blood sugar at least 4 times a day, starting 2 days before surgery, to make sure that the level is not too high or low.  . Check your blood sugar the morning of your surgery when you wake up and every 2 hours until you get to the Short Stay unit.  o If your blood sugar is less than 70 mg/dL, you will need to treat for low blood sugar: - Do not take insulin. - Treat a low blood sugar (less than 70 mg/dL) with  cup of clear juice (cranberry or apple), 4 glucose tablets, OR glucose gel. - Recheck blood sugar in 15 minutes after treatment (to make sure it is greater than 70 mg/dL). If your blood sugar is not greater than 70 mg/dL on recheck, call [phone] for further instructions. . Report your blood sugar to the short stay nurse when you get to Short Stay.  . If you are admitted to the hospital after surgery: o Your blood sugar will be checked by the staff and you will probably be given insulin after surgery (instead of oral diabetes medicines) to make sure you have good blood sugar levels. o The goal for blood sugar control after surgery is 80-180 mg/dL.      Special instructions:   [facility]- Preparing For Surgery  Before surgery, you can play an important role. Because skin is not sterile, your skin needs to be as free of germs as possible. You can reduce the number of germs on your skin by washing with CHG (chlorahexidine gluconate) Soap before surgery.  CHG is an antiseptic cleaner which kills germs and bonds with the skin to continue killing germs even after washing.    Oral Hygiene is also important to reduce your risk of infection.  Remember - BRUSH YOUR TEETH THE MORNING OF SURGERY WITH YOUR REGULAR TOOTHPASTE  Please do not use if you have an allergy to CHG or antibacterial soaps.  If your skin becomes reddened/irritated stop using the CHG.  Do not shave (including legs and underarms) for at least 48 hours prior to first CHG shower. It is OK to shave your face.  Please follow these instructions carefully.   1. Shower the NIGHT BEFORE SURGERY and the MORNING OF SURGERY  2. If you chose to wash your hair, wash your hair first as usual with your normal shampoo.  3. After you shampoo, rinse your hair and body thoroughly to remove the shampoo.  4. Wash Face and genitals (private parts) with your normal soap.   5. Use CHG Soap as you would any other liquid soap. You can apply CHG directly to the skin and wash gently with a scrungie or a clean washcloth.   6. Apply the CHG Soap to your body ONLY FROM THE NECK DOWN.  Do not use on open wounds or open sores. Avoid contact with your eyes, ears, mouth and genitals (private parts). Wash Face and genitals (private parts)  with your normal soap.   7. Wash thoroughly, paying special attention to the area where your surgery will be performed.  8. Thoroughly rinse your body with warm water from the neck down.  9. DO NOT shower/wash with your normal soap after using and rinsing off the CHG Soap.  10. Pat yourself dry with a CLEAN TOWEL.  11. Wear CLEAN PAJAMAS to bed the night before surgery.  12. Place CLEAN SHEETS on your bed the night before your surgery.  13. DO NOT SLEEP WITH PETS.   Day of Surgery: SHOWER with CHG soap. Brush your teeth WITH YOUR REGULAR TOOTHPASTE. Wear Clean/Comfortable clothing the morning of surgery. Do not apply any deodorants/lotions.   Do not wear jewelry. Men may shave face and neck. Do not bring valuables to the hospital. [facility] is not responsible for any belongings or valuables.   Do NOT Smoke (Tobacco/Vaping) or drink Alcohol 24 hours prior to your procedure.  If you use a CPAP at night, you may bring all equipment for your overnight stay.   Contacts, glasses, or dentures may  not be worn into surgery, please bring cases for these belongings.   For patients admitted to the hospital, discharge time will be determined by your treatment team.   Patients discharged the day of surgery will not be allowed to drive home, and someone needs to stay with them for 24 hours.    Please read over the following fact sheets that you were given.

## 2020-07-01 NOTE — Progress Notes (Signed)
Emailed device clinic for device orders for upcoming sx 07/07/20.

## 2020-07-02 ENCOUNTER — Encounter (HOSPITAL_COMMUNITY)
Admission: RE | Admit: 2020-07-02 | Discharge: 2020-07-02 | Disposition: A | Discharge status: Home / Self Care | Payer: No Typology Code available for payment source | Source: Ambulatory Visit | Attending: Vascular Surgery | Admitting: Vascular Surgery

## 2020-07-02 ENCOUNTER — Encounter (HOSPITAL_COMMUNITY): Payer: Self-pay

## 2020-07-02 ENCOUNTER — Other Ambulatory Visit: Payer: Self-pay

## 2020-07-02 DIAGNOSIS — Z95810 Presence of automatic (implantable) cardiac defibrillator: Secondary | ICD-10-CM | POA: Diagnosis not present

## 2020-07-02 DIAGNOSIS — I2510 Atherosclerotic heart disease of native coronary artery without angina pectoris: Secondary | ICD-10-CM | POA: Diagnosis not present

## 2020-07-02 DIAGNOSIS — Z01812 Encounter for preprocedural laboratory examination: Secondary | ICD-10-CM | POA: Diagnosis present

## 2020-07-02 DIAGNOSIS — E1122 Type 2 diabetes mellitus with diabetic chronic kidney disease: Secondary | ICD-10-CM | POA: Insufficient documentation

## 2020-07-02 DIAGNOSIS — N183 Chronic kidney disease, stage 3 unspecified: Secondary | ICD-10-CM | POA: Diagnosis not present

## 2020-07-02 DIAGNOSIS — I129 Hypertensive chronic kidney disease with stage 1 through stage 4 chronic kidney disease, or unspecified chronic kidney disease: Secondary | ICD-10-CM | POA: Insufficient documentation

## 2020-07-02 DIAGNOSIS — Z79899 Other long term (current) drug therapy: Secondary | ICD-10-CM | POA: Insufficient documentation

## 2020-07-02 DIAGNOSIS — Z7982 Long term (current) use of aspirin: Secondary | ICD-10-CM | POA: Diagnosis not present

## 2020-07-02 DIAGNOSIS — Z87891 Personal history of nicotine dependence: Secondary | ICD-10-CM | POA: Insufficient documentation

## 2020-07-02 DIAGNOSIS — Z794 Long term (current) use of insulin: Secondary | ICD-10-CM | POA: Diagnosis not present

## 2020-07-02 DIAGNOSIS — I6523 Occlusion and stenosis of bilateral carotid arteries: Secondary | ICD-10-CM | POA: Diagnosis not present

## 2020-07-02 DIAGNOSIS — G4733 Obstructive sleep apnea (adult) (pediatric): Secondary | ICD-10-CM | POA: Insufficient documentation

## 2020-07-02 LAB — URINALYSIS, ROUTINE W REFLEX MICROSCOPIC
Bilirubin Urine: NEGATIVE
Glucose, UA: >=500 mg/dL — AB
Ketones, ur: NEGATIVE mg/dL
Leukocytes,Ua: NEGATIVE
Nitrite: NEGATIVE
Protein, ur: 100 mg/dL — AB
Specific Gravity, Urine: 1.012 (ref 1.005–1.030)
pH: 5 (ref 5.0–8.0)

## 2020-07-02 LAB — COMPREHENSIVE METABOLIC PANEL
ALT: 16 U/L (ref 0–44)
AST: 16 U/L (ref 15–41)
Albumin: 3.6 g/dL (ref 3.5–5.0)
Alkaline Phosphatase: 164 U/L — ABNORMAL HIGH (ref 38–126)
Anion gap: 8 (ref 5–15)
BUN: 33 mg/dL — ABNORMAL HIGH (ref 8–23)
CO2: 20 mmol/L — ABNORMAL LOW (ref 22–32)
Calcium: 9 mg/dL (ref 8.9–10.3)
Chloride: 104 mmol/L (ref 98–111)
Creatinine, Ser: 1.79 mg/dL — ABNORMAL HIGH (ref 0.61–1.24)
GFR, Estimated: 42 mL/min — ABNORMAL LOW (ref >60)
Glucose, Bld: 266 mg/dL — ABNORMAL HIGH (ref 70–99)
Potassium: 4.9 mmol/L (ref 3.5–5.1)
Sodium: 132 mmol/L — ABNORMAL LOW (ref 135–145)
Total Bilirubin: 0.6 mg/dL (ref 0.3–1.2)
Total Protein: 7 g/dL (ref 6.5–8.1)

## 2020-07-02 LAB — TYPE AND SCREEN
ABO/RH(D): AB POS
Antibody Screen: NEGATIVE

## 2020-07-02 LAB — APTT: aPTT: 29 seconds (ref 24–36)

## 2020-07-02 LAB — CBC
HCT: 38.1 % — ABNORMAL LOW (ref 39.0–52.0)
Hemoglobin: 12.2 g/dL — ABNORMAL LOW (ref 13.0–17.0)
MCH: 30 pg (ref 26.0–34.0)
MCHC: 32 g/dL (ref 30.0–36.0)
MCV: 93.8 fL (ref 80.0–100.0)
Platelets: 193 10*3/uL (ref 150–400)
RBC: 4.06 MIL/uL — ABNORMAL LOW (ref 4.22–5.81)
RDW: 13.3 % (ref 11.5–15.5)
WBC: 8.8 10*3/uL (ref 4.0–10.5)
nRBC: 0 % (ref 0.0–0.2)

## 2020-07-02 LAB — SURGICAL PCR SCREEN
MRSA, PCR: NEGATIVE
Staphylococcus aureus: NEGATIVE

## 2020-07-02 LAB — GLUCOSE, CAPILLARY: Glucose-Capillary: 239 mg/dL — ABNORMAL HIGH (ref 70–99)

## 2020-07-02 LAB — PROTIME-INR
INR: 1.1 (ref 0.8–1.2)
Prothrombin Time: 13.8 seconds (ref 11.4–15.2)

## 2020-07-02 NOTE — Progress Notes (Signed)
PCP - Angela Adam. Caryl Bis, MD Cardiologist & Electrophysiologist- Adrian Prows, MD  PPM/ICD - Medtronic ICD Device Orders - Faxed request for device orders to Dr. Irven Shelling office 07/02/20 @ 0754 Rep Notified -  Awaiting orders  Chest x-ray - 12/09/19 EKG - 04/04/20 Stress Test - Per pt, he believes he may have had one ~ 5 years ago; unsure where he had it done-- states it was possibly done @ Dr. Irven Shelling office or the [facility]; records requested. ECHO - 12/18/19 Cardiac Cath - 12/11/19  Sleep Study - Yes, positive for OSA CPAP - Yes  Fasting Blood Sugar <200s Checks Blood Sugar every other day  Blood Thinner Instructions: Continue Brillinta Aspirin Instructions: Continue  ERAS Protcol - N/A PRE-SURGERY Ensure or G2- N/A  COVID TEST- 06/10 @ [place] @ 11AM   Anesthesia review: Yes, cardiac hx.  Patient denies shortness of breath, fever, cough and chest pain at PAT appointment   All instructions explained to the patient, with a verbal understanding of the material. Patient agrees to go over the instructions while at home for a better understanding. Patient also instructed to self quarantine after being tested for COVID-19. The opportunity to ask questions was provided.

## 2020-07-02 NOTE — Progress Notes (Signed)
Faxed request for device orders to Dr. Irven Shelling office.

## 2020-07-03 NOTE — Progress Notes (Signed)
Device order request re-faxed to Dr. Irven Shelling office.

## 2020-07-03 NOTE — Anesthesia Preprocedure Evaluation (Addendum)
Anesthesia Evaluation  Patient identified by MRN, date of birth, ID band Patient awake    Reviewed: Allergy & Precautions, NPO status , Patient's Chart, lab work & pertinent test results  Airway Mallampati: II  TM Distance: >3 FB Neck ROM: Full    Dental  (+) Dental Advisory Given, Edentulous Upper, Missing   Pulmonary sleep apnea , pneumonia, former smoker,    Pulmonary exam normal breath sounds clear to auscultation       Cardiovascular hypertension, Pt. on medications and Pt. on home beta blockers + CAD, + Past MI, + Peripheral Vascular Disease and +CHF  Normal cardiovascular exam+ Cardiac Defibrillator  Rhythm:Regular Rate:Normal  Echocardiogram 12/18/2019:  Poor eco window and wall motion with reduced sensitivity. Left ventricle cavity is mildly dilated. Dilated cardiomyopathy. Hypokinetic global wall motion. Abnormal septal wall motion due to post-operative coronary artery bypass graft. Indeterminate diastolic filling pattern due to paced rhythm. Moderately depressed LV systolic function with visual EF 30-35%. Calculated EF 37%. Left atrial cavity is mildly dilated at 4.2 cm. Right ventricle cavity is normal in size. Normal right ventricular function. RV Pacemaker wires visualized. No significant change since 06/15/2019. EF previously 35-40%.  Echo 12/2018 1. Left ventricular ejection fraction, by visual estimation, is 25 to 30%. The left ventricle has severely decreased function. There is no left ventricular hypertrophy.  2. Definity contrast agent was given IV to delineate the left ventricular endocardial borders.  3. Left ventricular diastolic parameters are consistent with Grade II diastolic dysfunction (pseudonormalization).  4. Mildly dilated left ventricular internal cavity size.  5. The left ventricle demonstrates global hypokinesis.  6. Global right ventricle was not well visualized.The right ventricular size is not  well visualized. Right vetricular wall thickness was not assessed.  7. Left atrial size was mildly dilated.  8. TR signal is inadequate for assessing pulmonary artery systolic pressure.    Neuro/Psych PSYCHIATRIC DISORDERS Depression negative neurological ROS     GI/Hepatic Neg liver ROS, hiatal hernia,   Endo/Other  negative endocrine ROSdiabetes  Renal/GU Renal disease     Musculoskeletal  (+) Arthritis ,   Abdominal   Peds  Hematology negative hematology ROS (+)   Anesthesia Other Findings   Reproductive/Obstetrics                                                            Anesthesia Evaluation  Patient identified by MRN, date of birth, ID band Patient awake    Reviewed: Allergy & Precautions, NPO status , Patient's Chart, lab work & pertinent test results, reviewed documented beta blocker date and time   History of Anesthesia Complications Negative for: history of anesthetic complications  Airway Mallampati: II  TM Distance: >3 FB Neck ROM: Full    Dental  (+) Edentulous Upper, Dental Advisory Given   Pulmonary sleep apnea, Continuous Positive Airway Pressure Ventilation and Oxygen sleep apnea , former smoker,  05/23/2020 SARS coronavirus NEG   breath sounds clear to auscultation       Cardiovascular hypertension, Pt. on medications and Pt. on home beta blockers (-) angina+ CAD, + Past MI, + Cardiac Stents, + CABG and + Peripheral Vascular Disease  + pacemaker (BiV) + Cardiac Defibrillator  Rhythm:Regular Rate:Normal  11/2019 ECHO: global LV hypokinesis, EF 30-35%, no significant valvular abnormalities   Neuro/Psych Depression  negative neurological ROS     GI/Hepatic negative GI ROS, Neg liver ROS,   Endo/Other  diabetes (glu 215), Insulin Dependent, Oral Hypoglycemic AgentsMorbid obesity  Renal/GU Renal InsufficiencyRenal disease (creat 1.52)     Musculoskeletal  (+) Arthritis ,   Abdominal (+) + obese,    Peds  Hematology Brilinta   Anesthesia Other Findings   Reproductive/Obstetrics                           Anesthesia Physical Anesthesia Plan  ASA: IV  Anesthesia Plan: General   Post-op Pain Management:    Induction: Intravenous  PONV Risk Score and Plan: 2 and Ondansetron, Dexamethasone and Treatment may vary due to age or medical condition  Airway Management Planned: Oral ETT  Additional Equipment: Arterial line  Intra-op Plan:   Post-operative Plan: Extubation in OR  Informed Consent:                                   Anesthesia Evaluation  Patient identified by MRN, date of birth, ID band Patient awake    Reviewed: Allergy & Precautions, NPO status , Patient's Chart, lab work & pertinent test results, reviewed documented beta blocker date and time   History of Anesthesia Complications Negative for: history of anesthetic complications  Airway Mallampati: II  TM Distance: >3 FB Neck ROM: Full    Dental  (+) Edentulous Upper, Dental Advisory Given   Pulmonary sleep apnea, Continuous Positive Airway Pressure Ventilation and Oxygen sleep apnea , former smoker,  05/23/2020 SARS coronavirus NEG   breath sounds clear to auscultation       Cardiovascular hypertension, Pt. on medications and Pt. on home beta blockers (-) angina+ CAD, + Past MI, + Cardiac Stents, + CABG and + Peripheral Vascular Disease  + pacemaker (BiV) + Cardiac Defibrillator  Rhythm:Regular Rate:Normal  11/2019 ECHO: global LV hypokinesis, EF 30-35%, no significant valvular abnormalities   Neuro/Psych Depression negative neurological ROS     GI/Hepatic negative GI ROS, Neg liver ROS,   Endo/Other  diabetes (glu 215), Insulin Dependent, Oral Hypoglycemic AgentsMorbid obesity  Renal/GU Renal InsufficiencyRenal disease (creat 1.52)     Musculoskeletal  (+) Arthritis ,   Abdominal (+) + obese,   Peds  Hematology Brilinta   Anesthesia Other  Findings   Reproductive/Obstetrics                           Anesthesia Physical Anesthesia Plan  ASA: IV  Anesthesia Plan: General   Post-op Pain Management:    Induction: Intravenous  PONV Risk Score and Plan: 2 and Ondansetron, Dexamethasone and Treatment may vary due to age or medical condition  Airway Management Planned: Oral ETT  Additional Equipment: Arterial line  Intra-op Plan:   Post-operative Plan: Extubation in OR  Informed Consent: I have reviewed the patients History and Physical, chart, labs and discussed the procedure including the risks, benefits and alternatives for the proposed anesthesia with the patient or authorized representative who has indicated his/her understanding and acceptance.     Dental advisory given  Plan Discussed with: CRNA and Surgeon  Anesthesia Plan Comments: (See APP note by Durel Salts, FNP  Plan routine monitors, A-line, GETA with magnet deactivation of AICD, R2 pads)      Anesthesia Quick Evaluation I have reviewed the patients History and Physical, chart, labs and  discussed the procedure including the risks, benefits and alternatives for the proposed anesthesia with the patient or authorized representative who has indicated his/her understanding and acceptance.     Dental advisory given  Plan Discussed with: CRNA and Surgeon  Anesthesia Plan Comments: (See APP note by Durel Salts, FNP  Plan routine monitors, A-line, GETA with magnet deactivation of AICD, R2 pads)      Anesthesia Quick Evaluation  Anesthesia Physical Anesthesia Plan  ASA: 4  Anesthesia Plan: General   Post-op Pain Management:    Induction: Intravenous  PONV Risk Score and Plan: 2 and Ondansetron, Dexamethasone and Treatment may vary due to age or medical condition  Airway Management Planned: Oral ETT  Additional Equipment: Arterial line  Intra-op Plan:   Post-operative Plan: Extubation in OR  Informed Consent: I  have reviewed the patients History and Physical, chart, labs and discussed the procedure including the risks, benefits and alternatives for the proposed anesthesia with the patient or authorized representative who has indicated his/her understanding and acceptance.     Dental advisory given  Plan Discussed with:   Anesthesia Plan Comments: (PAT note written 07/03/2020 by Myra Gianotti, PA-C. Has ICD. Cardiologist is Dr. Einar Gip. )      Anesthesia Quick Evaluation

## 2020-07-03 NOTE — Progress Notes (Addendum)
Anesthesia Chart Review:  Case: [id number] Date/Time: 07/07/20 0715   Procedure: RIGHT TRANSCAROTID ARTERY REVASCULARIZATION (Right)   Anesthesia type: General   Pre-op diagnosis: BILATERAL CAROTID ARTERY STENOSIS   Location: [facility] OR ROOM 16 / [place] OR   Surgeons: [name], MD       DISCUSSION: Patient is a 65 year old male scheduled for the above procedure. He is s/p left TCAR on 05/26/20.   History includes former smoker (quit 01/26/00), CAD (stents x2 2000; s/p CABG x2: LIMA-LAD, SVG-CX 2002 with subsequent occlusion SVG-CX; DES SVG-OM 03/25/09 & 07/16/10; DES proximal CX 01/15/11; DES distal CX and DES LM 07/18/13;  NSTEMI, s/p complex angioplasty proximal and mid CX & OM1 07/16/14; angioplasty LM, OM & PDA 08/04/17; orbital atherectomy LM/proximal LCX, DES LPDA & LM 08/16/17; NSTEMI, unchanged anatomy 08/20/17; angioplasty LM & mid CX 01/11/19; DES LM, angioplasty CX 10/09/19; 12/11/19, stable anatomy), ischemic cardiomyopathy with severe LV systolic function (EF 33-29% on 12/18/19 echo), 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 BMI is consistent with obesity.  - Hospitalized 05/26/20-05/27/20 s/p left TCAR.  - Hospitalized 12/09/19-12/12/19 for acute on chronic systolic HF. Troponin elevated secondary to HF. Stable coronary anatomy by cath. - Hospitalized 10/08/19-10/09/19 for acute pulmonary edema, NSTEMI, s/p DES LM, angioplasty CX 10/09/19.  A1c 9.8% on 05/23/20, down from 10.8 on 12/09/19. He reported home fasting CBGs < 200.  Creatinine 1.79, BUN 33, eGFR 42. Known CKD stage III with Creatinine trends 1.20-1.65 since ~ 03/2019, primarily 1.40-1.60 range. Forwarded to Dr. Carlis Abbott for review.  He is to continue ASA and Brilinta. Presurgical COVID-19 test is scheduled for 07/04/20. Anesthesia team to evaluate on the day of surgery.    VS: BP (!) 141/75   Pulse 73    Temp 36.8 C (Oral)   Resp 18   Ht '5\' 7"'  (1.702 m)   Wt 100.1 kg   SpO2 98%   BMI 34.57 kg/m   PROVIDERS: [name], MD is PCP Adrian Prows, MD is cardiologist, who referred patient to vascular surgery. Last visit 05/19/20.    LABS: Preoperative labs noted. See DISCUSSION. (all labs ordered are listed, but only abnormal results are displayed)  Labs Reviewed  GLUCOSE, CAPILLARY - Abnormal; Notable for the following components:      Result Value   Glucose-Capillary 239 (*)    All other components within normal limits  CBC - Abnormal; Notable for the following components:   RBC 4.06 (*)    Hemoglobin 12.2 (*)    HCT 38.1 (*)    All other components within normal limits  COMPREHENSIVE METABOLIC PANEL - Abnormal; Notable for the following components:   Sodium 132 (*)    CO2 20 (*)    Glucose, Bld 266 (*)    BUN 33 (*)    Creatinine, Ser 1.79 (*)    Alkaline Phosphatase 164 (*)    GFR, Estimated 42 (*)    All other components within normal limits  URINALYSIS, ROUTINE W REFLEX MICROSCOPIC - Abnormal; Notable for the following components:   APPearance HAZY (*)    Glucose, UA >=500 (*)    Hgb urine dipstick MODERATE (*)    Protein, ur 100 (*)    Bacteria, UA RARE (*)    All other components within normal limits  SURGICAL PCR SCREEN  PROTIME-INR  APTT  TYPE AND SCREEN  IMAGES: CT angio neck 05/12/20:  1. 80% stenosis of both internal carotid arteries proximally, progressed from 2018. 2. 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 3. Moderate proximal right vertebral artery stenosis, mildly progressed. 4. Aortic Atherosclerosis  - S/p left TCAR 05/26/20    1 view CXR 12/09/19 (in setting of hospitalization for acute CHF):  - Bilateral perihilar and lower lung zone pulmonary infiltrates most in keeping with moderate probable cardiogenic pulmonary edema.     EKG 04/04/20: AV paced rhythm     CV: Carotid duplex  06/14/20:  Summary:  - Right Carotid: Velocities in the right ICA are consistent with a 80-99% stenosis.  - Left Carotid: There is no evidence of stenosis in the left ICA. Patent stent with no evidence of restenosis.  - Vertebrals:  Bilateral vertebral arteries demonstrate antegrade flow.  - Subclavians: Normal flow hemodynamics were seen in bilateral subclavian arteries.     Echo 12/18/19: - Poor eco window and wall motion with reduced sensitivity.  - Left ventricle cavity is mildly dilated. Dilated cardiomyopathy.  - Hypokinetic global wall motion. Abnormal septal wall motion due to post-operative coronary artery bypass graft. Indeterminate diastolic  filling pattern due to paced rhythm. Moderately depressed LV systolic function with visual EF 30-35%.  Calculated EF 37%.  - Left atrial cavity is mildly dilated at 4.2 cm.  - Right ventricle cavity is normal in size. Normal right ventricular  function. RV Pacemaker wires visualized.  - No significant change since 06/15/2019. EF previously 35-40%.     Cardiac cath 12/11/19:  - LM: Protected. Patent LM/LCx stent - LAD: Prox 100% occluded. Bypassed by patent LIMA attaching in mid LAD.  Mid to distal LAD 50% disease LCxL: Patent LM/LCx stent. OM3 60% stenosis RCA: Small caliber with severe diffuse disease. Dampening of the pressures noted on engagement   No change in coronary anatomy compared to prior cath in 09/2019 Current presentation likely due to acute on chronic HFrEF Continue guideline directed medical therapy    Past Medical History:  Diagnosis Date   AICD (automatic cardioverter/defibrillator) present    Medtronic   Arthritis    "thumbs"  (08/02/2017)   CHF (congestive heart failure) (HCC)    Chronic combined systolic and diastolic heart failure ([place])    a. 08/2017 Echo: EF 20-25%, mod glob HK. Sev distal ant sept, inflat HK. Apical AK. Gr2 DD. Mildly reduced RV fxn.   Colon polyps    Coronary artery disease    a. s/p CABG x 2  (LIMA->LAD, VG->OM); b. Multiple PCI's to LM/LCX/OM; c. 07/2017 PTCA of LM/LCX w/ early ISR-->repeat PCI/DES to LM (3.5x20 Synergy DES) and LCX (3.0x20 Synergy DES); d. 07/2018 Relook Cath: LM patent stent, LAD 100ost, LCX patent stent, OM1 99/60, LIMA->LAD ok. VG->dLCX 42 (old).   Depression    Encounter for assessment of implantable cardioverter-defibrillator (ICD) 09/27/2018   High cholesterol    History of hiatal hernia    Hypertension    ICD; Biventricular  Medtronic ICD Amplia MRI QWuad CRT-D  in situ 10/29/14 10/29/2014   Remote ICD check 09.23.20:  One 6 beat NSVT. No therapy.  1 SVT episode @ 130 bpm (38 Sec).  There were 23 Vent sense episodes detected for up to 1.1 min/day (AT). Health trends (patient activity, heart rate variability, average heart rates) are stable.Trans-thoracic impedance trends and the OptiVol Fluid Index do no present significant abnormalities. Battery longevity is 4.3 years. RA pacing is 3   Ischemic  cardiomyopathy 09/27/2018   MI (myocardial infarction) ([place]) 2003   NSTEMI (non-ST elevated myocardial infarction) ([place]) 07/16/2014   OSA on CPAP    Oxygen deficiency    Peripheral vascular disease ([place])    Pneumonia 10/2014   Proteinuria    Sleep apnea    Type II diabetes mellitus ([place])    insulin dependent    Past Surgical History:  Procedure Laterality Date   BIOPSY  09/19/2018   Procedure: BIOPSY;  Surgeon: Thornton Park, MD;  Location: [facility];  Service: Gastroenterology;;   CARDIAC CATHETERIZATION N/A 07/16/2014   Procedure: Left Heart Cath and Coronary Angiography;  Surgeon: Adrian Prows, MD;  Location: [place] [facility];  Service: Cardiovascular;  Laterality: N/A;   CARDIAC CATHETERIZATION  07/16/2014   Procedure: Coronary Balloon Angioplasty;  Surgeon: Adrian Prows, MD;  Location: [place] [facility];  Service: Cardiovascular;;   CARDIAC CATHETERIZATION  2003   CARDIAC DEFIBRILLATOR PLACEMENT  2016   CATARACT EXTRACTION Left 2018   CATARACT EXTRACTION W/  INTRAOCULAR LENS IMPLANT Left 03/2014   COLONOSCOPY     COLONOSCOPY WITH PROPOFOL N/A 09/19/2018   Procedure: COLONOSCOPY WITH PROPOFOL;  Surgeon: Thornton Park, MD;  Location: [facility];  Service: Gastroenterology;  Laterality: N/A;   CORONARY ANGIOPLASTY WITH STENT PLACEMENT     "I've got a total of 8 stents in there; mostly doine at [facility]" (08/02/2017)   CORONARY ARTERY BYPASS GRAFT  ~ 2003   "CABG X2"; [facility]   CORONARY ATHERECTOMY N/A 08/16/2017   Procedure: CORONARY ATHERECTOMY;  Surgeon: Nigel Mormon, MD;  Location: [place] [facility] LAB;  Service: Cardiovascular;  Laterality: N/A;   CORONARY BALLOON ANGIOPLASTY N/A 08/04/2017   Procedure: CORONARY BALLOON ANGIOPLASTY;  Surgeon: Adrian Prows, MD;  Location: [place] [facility] LAB;  Service: Cardiovascular;  Laterality: N/A;   CORONARY BALLOON ANGIOPLASTY N/A 01/11/2019   Procedure: CORONARY BALLOON ANGIOPLASTY;  Surgeon: Adrian Prows, MD;  Location: [place] [facility] LAB;  Service: Cardiovascular;  Laterality: N/A;   CORONARY BALLOON ANGIOPLASTY N/A 10/09/2019   Procedure: CORONARY BALLOON ANGIOPLASTY;  Surgeon: Adrian Prows, MD;  Location: [place] [facility] LAB;  Service: Cardiovascular;  Laterality: N/A;   CORONARY STENT INTERVENTION N/A 08/16/2017   Procedure: CORONARY STENT INTERVENTION;  Surgeon: Nigel Mormon, MD;  Location: [place] [facility];  Service: Cardiovascular;  Laterality: N/A;   CORONARY STENT INTERVENTION N/A 10/09/2019   Procedure: CORONARY STENT INTERVENTION;  Surgeon: Adrian Prows, MD;  Location: [place] [facility] LAB;  Service: Cardiovascular;  Laterality: N/A;   ELBOW SURGERY Left ?2001   "pinched nerve"   ENDOSCOPIC MUCOSAL RESECTION  12/18/2018   Procedure: ENDOSCOPIC MUCOSAL RESECTION;  Surgeon: Rush Landmark Telford Nab., MD;  Location: [place];  Service: Gastroenterology;;   EYE SURGERY Left    cataract removal   FLEXIBLE SIGMOIDOSCOPY N/A 12/18/2018   Procedure: FLEXIBLE SIGMOIDOSCOPY;  Surgeon:  Irving Copas., MD;  Location: [place];  Service: Gastroenterology;  Laterality: N/A;   HEMOSTASIS CLIP PLACEMENT  12/18/2018   Procedure: HEMOSTASIS CLIP PLACEMENT;  Surgeon: Irving Copas., MD;  Location: [place];  Service: Gastroenterology;;   LEFT HEART CATH AND CORONARY ANGIOGRAPHY N/A 01/11/2019   Procedure: LEFT HEART CATH AND CORONARY ANGIOGRAPHY;  Surgeon: Adrian Prows, MD;  Location: [place] [facility] LAB;  Service: Cardiovascular;  Laterality: N/A;   LEFT HEART CATH AND CORS/GRAFTS ANGIOGRAPHY N/A 08/04/2017   Procedure: LEFT HEART CATH AND CORS/GRAFTS ANGIOGRAPHY;  Surgeon: Adrian Prows, MD;  Location: [place] [facility] LAB;  Service: Cardiovascular;  Laterality: N/A;   LEFT HEART CATH AND CORS/GRAFTS ANGIOGRAPHY N/A 08/20/2017   Procedure: LEFT HEART CATH AND CORS/GRAFTS ANGIOGRAPHY;  Surgeon: Nigel Mormon, MD;  Location: [place] [facility] LAB;  Service: Cardiovascular;  Laterality: N/A;   LEFT HEART CATH AND CORS/GRAFTS ANGIOGRAPHY N/A 01/08/2019   Procedure: LEFT HEART CATH AND CORS/GRAFTS ANGIOGRAPHY;  Surgeon: [place] Hampshire, MD;  Location: [place] [facility] LAB;  Service: Cardiovascular;  Laterality: N/A;   LEFT HEART CATH AND CORS/GRAFTS ANGIOGRAPHY N/A 10/09/2019   Procedure: LEFT HEART CATH AND CORS/GRAFTS ANGIOGRAPHY;  Surgeon: Adrian Prows, MD;  Location: [place] CV LAB;  Service: Cardiovascular;  Laterality: N/A;   LEFT HEART CATH AND CORS/GRAFTS ANGIOGRAPHY N/A 12/11/2019   Procedure: LEFT HEART CATH AND CORS/GRAFTS ANGIOGRAPHY;  Surgeon: Nigel Mormon, MD;  Location: [place] CV LAB;  Service: Cardiovascular;  Laterality: N/A;   POLYPECTOMY  09/19/2018   Procedure: POLYPECTOMY;  Surgeon: Thornton Park, MD;  [facility];  Service: Gastroenterology;;   Lia Foyer INJECTION  09/19/2018   Procedure: SUBMUCOSAL INJECTION;  Surgeon: Thornton Park, MD;  [facility];  Service: Gastroenterology;;   Lia Foyer LIFTING  INJECTION  12/18/2018   Procedure: SUBMUCOSAL LIFTING INJECTION;  Surgeon: Irving Copas., MD;  Location: [place];  Service: Gastroenterology;;   TRANSCAROTID ARTERY REVASCULARIZATION  Left 05/26/2020   Procedure: LEFT TRANSCAROTID ARTERY REVASCULARIZATION;  Surgeon: [name], MD;  Location: [place];  Service: Vascular;  Laterality: Left;   ULTRASOUND GUIDANCE FOR VASCULAR ACCESS Right 05/26/2020   Procedure: ULTRASOUND GUIDANCE FOR VASCULAR ACCESS;  Surgeon: [name], MD;  [facility];  Service: Vascular;  Laterality: Right;    MEDICATIONS:  amiodarone (PACERONE) 200 MG tablet   aspirin EC 81 MG EC tablet   carvedilol (COREG) 12.5 MG tablet   Cholecalciferol (VITAMIN D3) 50 MCG (2000 UT) TABS   Cyanocobalamin 2500 MCG CHEW   insulin aspart protamine- aspart (NOVOLOG MIX 70/30) (70-30) 100 UNIT/ML injection   insulin isophane & regular human (NOVOLIN 70/30 FLEXPEN) (70-30) 100 UNIT/ML KwikPen   metFORMIN (GLUCOPHAGE-XR) 500 MG 24 hr tablet   nitroGLYCERIN (NITROSTAT) 0.4 MG SL tablet   Omega-3 Fatty Acids (FISH OIL) 1000 MG CAPS   oxyCODONE-acetaminophen (PERCOCET) 5-325 MG tablet   OXYGEN   potassium chloride SA (KLOR-CON) 20 MEQ tablet   PRESCRIPTION MEDICATION   rosuvastatin (CRESTOR) 40 MG tablet   sacubitril-valsartan (ENTRESTO) 97-103 MG   Semaglutide, 1 MG/DOSE, 2 MG/1.5ML SOPN   sertraline (ZOLOFT) 100 MG tablet   ticagrelor (BRILINTA) 90 MG TABS tablet   torsemide (DEMADEX) 20 MG tablet   No current facility-administered medications for this encounter.    Myra Gianotti, PA-C Surgical Short Stay/Anesthesiology [facility] Phone [phone] [facility] Phone [phone] 07/03/2020 11:27 AM

## 2020-07-04 ENCOUNTER — Other Ambulatory Visit
Admission: RE | Admit: 2020-07-04 | Discharge: 2020-07-04 | Disposition: A | Discharge status: Home / Self Care | Payer: No Typology Code available for payment source | Source: Ambulatory Visit | Attending: Vascular Surgery | Admitting: Vascular Surgery

## 2020-07-04 ENCOUNTER — Other Ambulatory Visit: Payer: Self-pay

## 2020-07-04 DIAGNOSIS — Z20822 Contact with and (suspected) exposure to covid-19: Secondary | ICD-10-CM | POA: Diagnosis not present

## 2020-07-04 DIAGNOSIS — Z01812 Encounter for preprocedural laboratory examination: Secondary | ICD-10-CM | POA: Diagnosis present

## 2020-07-04 LAB — SARS CORONAVIRUS 2 (TAT 6-24 HRS): SARS Coronavirus 2: NEGATIVE

## 2020-07-07 ENCOUNTER — Inpatient Hospital Stay (HOSPITAL_COMMUNITY): Payer: No Typology Code available for payment source | Admitting: Anesthesiology

## 2020-07-07 ENCOUNTER — Inpatient Hospital Stay (HOSPITAL_COMMUNITY): Payer: No Typology Code available for payment source | Admitting: Physician Assistant

## 2020-07-07 ENCOUNTER — Inpatient Hospital Stay (HOSPITAL_COMMUNITY)
Admission: RE | Admit: 2020-07-07 | Discharge: 2020-07-08 | DRG: 035 | Disposition: A | Discharge status: Home / Self Care | Payer: No Typology Code available for payment source | Attending: Vascular Surgery | Admitting: Vascular Surgery

## 2020-07-07 ENCOUNTER — Encounter (HOSPITAL_COMMUNITY): Payer: Self-pay | Admitting: Vascular Surgery

## 2020-07-07 ENCOUNTER — Other Ambulatory Visit: Payer: Self-pay

## 2020-07-07 ENCOUNTER — Encounter (HOSPITAL_COMMUNITY)
Admission: RE | Disposition: A | Discharge status: Home / Self Care | Payer: Self-pay | Source: Home / Self Care | Attending: Vascular Surgery

## 2020-07-07 DIAGNOSIS — Z9981 Dependence on supplemental oxygen: Secondary | ICD-10-CM

## 2020-07-07 DIAGNOSIS — G4733 Obstructive sleep apnea (adult) (pediatric): Secondary | ICD-10-CM | POA: Diagnosis present

## 2020-07-07 DIAGNOSIS — I6521 Occlusion and stenosis of right carotid artery: Secondary | ICD-10-CM | POA: Diagnosis present

## 2020-07-07 DIAGNOSIS — Z7902 Long term (current) use of antithrombotics/antiplatelets: Secondary | ICD-10-CM

## 2020-07-07 DIAGNOSIS — I5042 Chronic combined systolic (congestive) and diastolic (congestive) heart failure: Secondary | ICD-10-CM | POA: Diagnosis present

## 2020-07-07 DIAGNOSIS — Z794 Long term (current) use of insulin: Secondary | ICD-10-CM

## 2020-07-07 DIAGNOSIS — Z951 Presence of aortocoronary bypass graft: Secondary | ICD-10-CM | POA: Diagnosis not present

## 2020-07-07 DIAGNOSIS — Z801 Family history of malignant neoplasm of trachea, bronchus and lung: Secondary | ICD-10-CM | POA: Diagnosis not present

## 2020-07-07 DIAGNOSIS — F32A Depression, unspecified: Secondary | ICD-10-CM | POA: Diagnosis present

## 2020-07-07 DIAGNOSIS — I2510 Atherosclerotic heart disease of native coronary artery without angina pectoris: Secondary | ICD-10-CM | POA: Diagnosis present

## 2020-07-07 DIAGNOSIS — I110 Hypertensive heart disease with heart failure: Secondary | ICD-10-CM | POA: Diagnosis present

## 2020-07-07 DIAGNOSIS — Z95810 Presence of automatic (implantable) cardiac defibrillator: Secondary | ICD-10-CM | POA: Diagnosis not present

## 2020-07-07 DIAGNOSIS — Z7982 Long term (current) use of aspirin: Secondary | ICD-10-CM | POA: Diagnosis not present

## 2020-07-07 DIAGNOSIS — I5043 Acute on chronic combined systolic (congestive) and diastolic (congestive) heart failure: Secondary | ICD-10-CM | POA: Diagnosis not present

## 2020-07-07 DIAGNOSIS — Z833 Family history of diabetes mellitus: Secondary | ICD-10-CM | POA: Diagnosis not present

## 2020-07-07 DIAGNOSIS — Z955 Presence of coronary angioplasty implant and graft: Secondary | ICD-10-CM | POA: Diagnosis not present

## 2020-07-07 DIAGNOSIS — Z79899 Other long term (current) drug therapy: Secondary | ICD-10-CM | POA: Diagnosis not present

## 2020-07-07 DIAGNOSIS — Z8249 Family history of ischemic heart disease and other diseases of the circulatory system: Secondary | ICD-10-CM | POA: Diagnosis not present

## 2020-07-07 DIAGNOSIS — E1151 Type 2 diabetes mellitus with diabetic peripheral angiopathy without gangrene: Secondary | ICD-10-CM | POA: Diagnosis present

## 2020-07-07 DIAGNOSIS — I252 Old myocardial infarction: Secondary | ICD-10-CM

## 2020-07-07 DIAGNOSIS — Z83438 Family history of other disorder of lipoprotein metabolism and other lipidemia: Secondary | ICD-10-CM

## 2020-07-07 DIAGNOSIS — E785 Hyperlipidemia, unspecified: Secondary | ICD-10-CM | POA: Diagnosis present

## 2020-07-07 DIAGNOSIS — Z7984 Long term (current) use of oral hypoglycemic drugs: Secondary | ICD-10-CM | POA: Diagnosis not present

## 2020-07-07 HISTORY — PX: ULTRASOUND GUIDANCE FOR VASCULAR ACCESS: SHX6516

## 2020-07-07 HISTORY — PX: TRANSCAROTID ARTERY REVASCULARIZATIONÂ: SHX6778

## 2020-07-07 LAB — GLUCOSE, CAPILLARY
Glucose-Capillary: 267 mg/dL — ABNORMAL HIGH (ref 70–99)
Glucose-Capillary: 218 mg/dL — ABNORMAL HIGH (ref 70–99)
Glucose-Capillary: 256 mg/dL — ABNORMAL HIGH (ref 70–99)
Glucose-Capillary: 309 mg/dL — ABNORMAL HIGH (ref 70–99)

## 2020-07-07 LAB — POCT ACTIVATED CLOTTING TIME: Activated Clotting Time: 294 seconds

## 2020-07-07 SURGERY — TRANSCAROTID ARTERY REVASCULARIZATION (TCAR)
Anesthesia: General | Site: Neck | Laterality: Right

## 2020-07-07 MED ORDER — LACTATED RINGERS IV SOLN: INTRAVENOUS | Status: DC | PRN

## 2020-07-07 MED ORDER — ORAL CARE MOUTH RINSE: 15.0000 mL | Freq: Once | OROMUCOSAL | Status: AC

## 2020-07-07 MED ORDER — CHLORHEXIDINE GLUCONATE CLOTH 2 % EX PADS: 6.0000 | MEDICATED_PAD | Freq: Once | CUTANEOUS | Status: DC

## 2020-07-07 MED ORDER — IODIXANOL 320 MG/ML IV SOLN
INTRAVENOUS | Status: DC | PRN
  Administered 2020-07-07: 25 mL via INTRA_ARTERIAL

## 2020-07-07 MED ORDER — SACUBITRIL-VALSARTAN 97-103 MG PO TABS
1.0000 | ORAL_TABLET | Freq: Two times a day (BID) | ORAL | Status: DC
  Administered 2020-07-07 – 2020-07-08 (×3): 1 via ORAL
  Filled 2020-07-07 (×3): qty 1

## 2020-07-07 MED ORDER — HEMOSTATIC AGENTS (NO CHARGE) OPTIME
TOPICAL | Status: DC | PRN
  Administered 2020-07-07: 1 via TOPICAL

## 2020-07-07 MED ORDER — ACETAMINOPHEN 325 MG PO TABS: 325.0000 mg | ORAL_TABLET | ORAL | Status: DC | PRN

## 2020-07-07 MED ORDER — ROSUVASTATIN CALCIUM 20 MG PO TABS
40.0000 mg | ORAL_TABLET | Freq: Every day | ORAL | Status: DC
  Administered 2020-07-07: 40 mg via ORAL
  Filled 2020-07-07: qty 2

## 2020-07-07 MED ORDER — ACETAMINOPHEN 650 MG RE SUPP
325.0000 mg | RECTAL | Status: DC | PRN
Start: 2020-07-07 — End: 2020-07-08

## 2020-07-07 MED ORDER — CEFAZOLIN SODIUM-DEXTROSE 2-4 GM/100ML-% IV SOLN
2.0000 g | INTRAVENOUS | Status: AC
  Administered 2020-07-07: 2 g via INTRAVENOUS
  Filled 2020-07-07: qty 100

## 2020-07-07 MED ORDER — SODIUM CHLORIDE 0.9 % IV SOLN: INTRAVENOUS | Status: DC

## 2020-07-07 MED ORDER — ONDANSETRON HCL 4 MG/2ML IJ SOLN
INTRAMUSCULAR | Status: DC | PRN
  Administered 2020-07-07: 4 mg via INTRAVENOUS

## 2020-07-07 MED ORDER — SODIUM CHLORIDE 0.9 % IV SOLN
0.0125 ug/kg/min | INTRAVENOUS | Status: AC
  Administered 2020-07-07: .1 ug/kg/min via INTRAVENOUS
  Filled 2020-07-07: qty 2000

## 2020-07-07 MED ORDER — ROCURONIUM BROMIDE 10 MG/ML (PF) SYRINGE
PREFILLED_SYRINGE | INTRAVENOUS | Status: AC
  Filled 2020-07-07: qty 10

## 2020-07-07 MED ORDER — EPHEDRINE SULFATE-NACL 50-0.9 MG/10ML-% IV SOSY
PREFILLED_SYRINGE | INTRAVENOUS | Status: DC | PRN
  Administered 2020-07-07 (×3): 5 mg via INTRAVENOUS
  Administered 2020-07-07 (×2): 10 mg via INTRAVENOUS

## 2020-07-07 MED ORDER — ALUM & MAG HYDROXIDE-SIMETH 200-200-20 MG/5ML PO SUSP: 15.0000 mL | ORAL | Status: DC | PRN

## 2020-07-07 MED ORDER — PHENYLEPHRINE HCL-NACL 10-0.9 MG/250ML-% IV SOLN
INTRAVENOUS | Status: DC | PRN
  Administered 2020-07-07: 25 ug/min via INTRAVENOUS

## 2020-07-07 MED ORDER — POTASSIUM CHLORIDE CRYS ER 20 MEQ PO TBCR
20.0000 meq | EXTENDED_RELEASE_TABLET | Freq: Two times a day (BID) | ORAL | Status: DC
  Administered 2020-07-07 – 2020-07-08 (×3): 20 meq via ORAL
  Filled 2020-07-07 (×3): qty 1

## 2020-07-07 MED ORDER — SODIUM CHLORIDE 0.9 % IV SOLN
INTRAVENOUS | Status: DC | PRN
  Administered 2020-07-07: 500 mL

## 2020-07-07 MED ORDER — LABETALOL HCL 5 MG/ML IV SOLN
10.0000 mg | INTRAVENOUS | Status: DC | PRN
  Filled 2020-07-07: qty 4

## 2020-07-07 MED ORDER — INSULIN ISOPHANE & REGULAR (HUMAN 70-30)100 UNIT/ML KWIKPEN: 30.0000 [IU] | PEN_INJECTOR | Freq: Two times a day (BID) | SUBCUTANEOUS | Status: DC | PRN

## 2020-07-07 MED ORDER — TICAGRELOR 90 MG PO TABS: 90.0000 mg | ORAL_TABLET | Freq: Two times a day (BID) | ORAL | Status: DC

## 2020-07-07 MED ORDER — SUGAMMADEX SODIUM 200 MG/2ML IV SOLN
INTRAVENOUS | Status: DC | PRN
  Administered 2020-07-07: 200 mg via INTRAVENOUS

## 2020-07-07 MED ORDER — POTASSIUM CHLORIDE CRYS ER 20 MEQ PO TBCR: 20.0000 meq | EXTENDED_RELEASE_TABLET | Freq: Every day | ORAL | Status: DC | PRN

## 2020-07-07 MED ORDER — ROCURONIUM BROMIDE 10 MG/ML (PF) SYRINGE
PREFILLED_SYRINGE | INTRAVENOUS | Status: DC | PRN
  Administered 2020-07-07: 10 mg via INTRAVENOUS
  Administered 2020-07-07: 60 mg via INTRAVENOUS

## 2020-07-07 MED ORDER — PROTAMINE SULFATE 10 MG/ML IV SOLN
INTRAVENOUS | Status: DC | PRN
  Administered 2020-07-07 (×2): 20 mg via INTRAVENOUS
  Administered 2020-07-07: 10 mg via INTRAVENOUS

## 2020-07-07 MED ORDER — DEXAMETHASONE SODIUM PHOSPHATE 10 MG/ML IJ SOLN
INTRAMUSCULAR | Status: DC | PRN
  Administered 2020-07-07: 5 mg via INTRAVENOUS

## 2020-07-07 MED ORDER — INSULIN ASPART 100 UNIT/ML IJ SOLN
0.0000 [IU] | Freq: Three times a day (TID) | INTRAMUSCULAR | Status: DC
Start: 2020-07-07 — End: 2020-07-08
  Administered 2020-07-07: 5 [IU] via SUBCUTANEOUS
  Administered 2020-07-07: 11 [IU] via SUBCUTANEOUS
  Administered 2020-07-07: 5 [IU] via SUBCUTANEOUS
  Administered 2020-07-08: 3 [IU] via SUBCUTANEOUS

## 2020-07-07 MED ORDER — PROPOFOL 10 MG/ML IV BOLUS
INTRAVENOUS | Status: AC
  Filled 2020-07-07: qty 20

## 2020-07-07 MED ORDER — GLYCOPYRROLATE PF 0.2 MG/ML IJ SOSY
PREFILLED_SYRINGE | INTRAMUSCULAR | Status: AC
  Filled 2020-07-07: qty 1

## 2020-07-07 MED ORDER — GUAIFENESIN-DM 100-10 MG/5ML PO SYRP: 15.0000 mL | ORAL_SOLUTION | ORAL | Status: DC | PRN

## 2020-07-07 MED ORDER — INSULIN ASPART 100 UNIT/ML IJ SOLN
INTRAMUSCULAR | Status: AC
  Administered 2020-07-07: 100 [IU]
  Filled 2020-07-07: qty 1

## 2020-07-07 MED ORDER — LIDOCAINE HCL (CARDIAC) PF 100 MG/5ML IV SOSY
PREFILLED_SYRINGE | INTRAVENOUS | Status: DC | PRN
  Administered 2020-07-07: 100 mg via INTRAVENOUS

## 2020-07-07 MED ORDER — HYDRALAZINE HCL 20 MG/ML IJ SOLN: 5.0000 mg | INTRAMUSCULAR | Status: DC | PRN

## 2020-07-07 MED ORDER — LIDOCAINE HCL (PF) 2 % IJ SOLN
INTRAMUSCULAR | Status: AC
  Filled 2020-07-07: qty 5

## 2020-07-07 MED ORDER — PHENOL 1.4 % MT LIQD: 1.0000 | OROMUCOSAL | Status: DC | PRN

## 2020-07-07 MED ORDER — FENTANYL CITRATE (PF) 250 MCG/5ML IJ SOLN
INTRAMUSCULAR | Status: AC
  Filled 2020-07-07: qty 5

## 2020-07-07 MED ORDER — OXYCODONE-ACETAMINOPHEN 5-325 MG PO TABS
1.0000 | ORAL_TABLET | ORAL | Status: DC | PRN
  Administered 2020-07-07: 2 via ORAL
  Filled 2020-07-07: qty 2

## 2020-07-07 MED ORDER — ESMOLOL HCL 100 MG/10ML IV SOLN
INTRAVENOUS | Status: AC
  Filled 2020-07-07: qty 10

## 2020-07-07 MED ORDER — MAGNESIUM SULFATE 2 GM/50ML IV SOLN: 2.0000 g | Freq: Every day | INTRAVENOUS | Status: DC | PRN

## 2020-07-07 MED ORDER — TICAGRELOR 90 MG PO TABS
90.0000 mg | ORAL_TABLET | Freq: Two times a day (BID) | ORAL | Status: DC
  Administered 2020-07-07 – 2020-07-08 (×2): 90 mg via ORAL
  Filled 2020-07-07 (×2): qty 1

## 2020-07-07 MED ORDER — SERTRALINE HCL 50 MG PO TABS
50.0000 mg | ORAL_TABLET | Freq: Every day | ORAL | Status: DC
  Administered 2020-07-07 – 2020-07-08 (×2): 50 mg via ORAL
  Filled 2020-07-07 (×2): qty 1

## 2020-07-07 MED ORDER — ONDANSETRON HCL 4 MG/2ML IJ SOLN
INTRAMUSCULAR | Status: AC
  Filled 2020-07-07: qty 2

## 2020-07-07 MED ORDER — FENTANYL CITRATE (PF) 100 MCG/2ML IJ SOLN: 25.0000 ug | INTRAMUSCULAR | Status: DC | PRN

## 2020-07-07 MED ORDER — AMISULPRIDE (ANTIEMETIC) 5 MG/2ML IV SOLN: 10.0000 mg | Freq: Once | INTRAVENOUS | Status: DC | PRN

## 2020-07-07 MED ORDER — SODIUM CHLORIDE 0.9 % IV SOLN: 500.0000 mL | Freq: Once | INTRAVENOUS | Status: DC | PRN

## 2020-07-07 MED ORDER — SODIUM CHLORIDE 0.9 % IV SOLN
INTRAVENOUS | Status: AC
  Filled 2020-07-07: qty 1.2

## 2020-07-07 MED ORDER — CARVEDILOL 12.5 MG PO TABS
12.5000 mg | ORAL_TABLET | Freq: Two times a day (BID) | ORAL | Status: DC
  Administered 2020-07-07 – 2020-07-08 (×2): 12.5 mg via ORAL
  Filled 2020-07-07 (×3): qty 1

## 2020-07-07 MED ORDER — PROPOFOL 10 MG/ML IV BOLUS
INTRAVENOUS | Status: DC | PRN
  Administered 2020-07-07: 120 mg via INTRAVENOUS

## 2020-07-07 MED ORDER — OXYCODONE HCL 5 MG/5ML PO SOLN: 5.0000 mg | Freq: Once | ORAL | Status: DC | PRN

## 2020-07-07 MED ORDER — LACTATED RINGERS IV SOLN: INTRAVENOUS | Status: DC

## 2020-07-07 MED ORDER — METFORMIN HCL ER 500 MG PO TB24
500.0000 mg | ORAL_TABLET | Freq: Two times a day (BID) | ORAL | Status: DC
  Administered 2020-07-07 – 2020-07-08 (×2): 500 mg via ORAL
  Filled 2020-07-07 (×2): qty 1

## 2020-07-07 MED ORDER — HEPARIN SODIUM (PORCINE) 1000 UNIT/ML IJ SOLN
INTRAMUSCULAR | Status: DC | PRN
  Administered 2020-07-07: 10000 [IU] via INTRAVENOUS

## 2020-07-07 MED ORDER — MORPHINE SULFATE (PF) 2 MG/ML IV SOLN: 2.0000 mg | INTRAVENOUS | Status: DC | PRN

## 2020-07-07 MED ORDER — FENTANYL CITRATE (PF) 250 MCG/5ML IJ SOLN
INTRAMUSCULAR | Status: DC | PRN
  Administered 2020-07-07: 100 ug via INTRAVENOUS

## 2020-07-07 MED ORDER — PROTAMINE SULFATE 10 MG/ML IV SOLN
INTRAVENOUS | Status: AC
  Filled 2020-07-07: qty 5

## 2020-07-07 MED ORDER — HEPARIN SODIUM (PORCINE) 1000 UNIT/ML IJ SOLN
INTRAMUSCULAR | Status: AC
  Filled 2020-07-07: qty 1

## 2020-07-07 MED ORDER — BISACODYL 5 MG PO TBEC: 5.0000 mg | DELAYED_RELEASE_TABLET | Freq: Every day | ORAL | Status: DC | PRN

## 2020-07-07 MED ORDER — ASPIRIN EC 81 MG PO TBEC
81.0000 mg | DELAYED_RELEASE_TABLET | Freq: Every day | ORAL | Status: DC
  Administered 2020-07-08: 81 mg via ORAL
  Filled 2020-07-07: qty 1

## 2020-07-07 MED ORDER — DOCUSATE SODIUM 100 MG PO CAPS
100.0000 mg | ORAL_CAPSULE | Freq: Every day | ORAL | Status: DC
  Filled 2020-07-07: qty 1

## 2020-07-07 MED ORDER — METOPROLOL TARTRATE 5 MG/5ML IV SOLN: 2.0000 mg | INTRAVENOUS | Status: DC | PRN

## 2020-07-07 MED ORDER — DEXAMETHASONE SODIUM PHOSPHATE 10 MG/ML IJ SOLN
INTRAMUSCULAR | Status: AC
  Filled 2020-07-07: qty 1

## 2020-07-07 MED ORDER — 0.9 % SODIUM CHLORIDE (POUR BTL) OPTIME
TOPICAL | Status: DC | PRN
  Administered 2020-07-07: 2000 mL

## 2020-07-07 MED ORDER — TORSEMIDE 20 MG PO TABS: 20.0000 mg | ORAL_TABLET | Freq: Every day | ORAL | Status: DC | PRN

## 2020-07-07 MED ORDER — GLYCOPYRROLATE PF 0.2 MG/ML IJ SOSY
PREFILLED_SYRINGE | INTRAMUSCULAR | Status: DC | PRN
  Administered 2020-07-07: .2 mg via INTRAVENOUS

## 2020-07-07 MED ORDER — AMIODARONE HCL 200 MG PO TABS
200.0000 mg | ORAL_TABLET | Freq: Every day | ORAL | Status: DC
  Administered 2020-07-08: 200 mg via ORAL
  Filled 2020-07-07: qty 1

## 2020-07-07 MED ORDER — POLYETHYLENE GLYCOL 3350 17 G PO PACK: 17.0000 g | PACK | Freq: Every day | ORAL | Status: DC | PRN

## 2020-07-07 MED ORDER — OXYCODONE HCL 5 MG PO TABS: 5.0000 mg | ORAL_TABLET | Freq: Once | ORAL | Status: DC | PRN

## 2020-07-07 MED ORDER — ATROPINE SULFATE 0.4 MG/ML IV SOSY
PREFILLED_SYRINGE | INTRAVENOUS | Status: DC | PRN
  Administered 2020-07-07: .2 mg via INTRAVENOUS

## 2020-07-07 MED ORDER — CEFAZOLIN SODIUM-DEXTROSE 2-4 GM/100ML-% IV SOLN
2.0000 g | Freq: Three times a day (TID) | INTRAVENOUS | Status: AC
  Administered 2020-07-07 (×2): 2 g via INTRAVENOUS
  Filled 2020-07-07 (×2): qty 100

## 2020-07-07 MED ORDER — CHLORHEXIDINE GLUCONATE 0.12 % MT SOLN
15.0000 mL | Freq: Once | OROMUCOSAL | Status: AC
  Administered 2020-07-07: 15 mL via OROMUCOSAL
  Filled 2020-07-07: qty 15

## 2020-07-07 MED ORDER — ONDANSETRON HCL 4 MG/2ML IJ SOLN: 4.0000 mg | Freq: Four times a day (QID) | INTRAMUSCULAR | Status: DC | PRN

## 2020-07-07 MED ORDER — PANTOPRAZOLE SODIUM 40 MG PO TBEC
40.0000 mg | DELAYED_RELEASE_TABLET | Freq: Every day | ORAL | Status: DC
  Administered 2020-07-07: 40 mg via ORAL
  Filled 2020-07-07 (×2): qty 1

## 2020-07-07 MED ORDER — PHENYLEPHRINE 40 MCG/ML (10ML) SYRINGE FOR IV PUSH (FOR BLOOD PRESSURE SUPPORT)
PREFILLED_SYRINGE | INTRAVENOUS | Status: DC | PRN
  Administered 2020-07-07 (×2): 80 ug via INTRAVENOUS

## 2020-07-07 SURGICAL SUPPLY — 58 items
BAG BANDED W/RUBBER/TAPE 36X54 (MISCELLANEOUS) ×3 IMPLANT
BALLN STERLING RX 6X30X80 (BALLOONS) ×3
BALLOON STERLING RX 6X30X80 (BALLOONS) ×2 IMPLANT
CANISTER SUCT 3000ML PPV (MISCELLANEOUS) ×3 IMPLANT
CATH ANGIO BERNSTEIN 5X40X.035 (CATHETERS) ×3 IMPLANT
CATH ROBINSON RED A/P 18FR (CATHETERS) IMPLANT
CLIP VESOCCLUDE MED 6/CT (CLIP) ×3 IMPLANT
CLIP VESOCCLUDE SM WIDE 6/CT (CLIP) ×3 IMPLANT
COVER DOME SNAP 22 D (MISCELLANEOUS) ×3 IMPLANT
COVER PROBE W GEL 5X96 (DRAPES) ×3 IMPLANT
DERMABOND ADVANCED (GAUZE/BANDAGES/DRESSINGS) ×1
DERMABOND ADVANCED .7 DNX12 (GAUZE/BANDAGES/DRESSINGS) ×2 IMPLANT
DRAPE FEMORAL ANGIO 80X135IN (DRAPES) ×3 IMPLANT
ELECT REM PT RETURN 9FT ADLT (ELECTROSURGICAL) ×3
ELECTRODE REM PT RTRN 9FT ADLT (ELECTROSURGICAL) ×2 IMPLANT
GLOVE SRG 8 PF TXTR STRL LF DI (GLOVE) ×2 IMPLANT
GLOVE SURG MICRO LTX SZ6.5 (GLOVE) ×3 IMPLANT
GLOVE SURG UNDER POLY LF SZ8 (GLOVE) ×3
GOWN STRL REUS W/ TWL LRG LVL3 (GOWN DISPOSABLE) ×4 IMPLANT
GOWN STRL REUS W/ TWL XL LVL3 (GOWN DISPOSABLE) ×2 IMPLANT
GOWN STRL REUS W/TWL LRG LVL3 (GOWN DISPOSABLE) ×6
GOWN STRL REUS W/TWL XL LVL3 (GOWN DISPOSABLE) ×3
GUIDEWIRE ENROUTE 0.014 (WIRE) ×6 IMPLANT
HEMOSTAT SNOW SURGICEL 2X4 (HEMOSTASIS) ×3 IMPLANT
INTRODUCER KIT GALT 7CM (INTRODUCER) ×3
KIT BASIN OR (CUSTOM PROCEDURE TRAY) ×3 IMPLANT
KIT ENCORE 26 ADVANTAGE (KITS) ×3 IMPLANT
KIT INTRODUCER GALT 7 (INTRODUCER) ×2 IMPLANT
KIT MICROPUNCTURE NIT STIFF (SHEATH) ×3 IMPLANT
KIT TURNOVER KIT B (KITS) ×3 IMPLANT
LOOP VESSEL MAXI BLUE (MISCELLANEOUS) ×3 IMPLANT
NEEDLE HYPO 25GX1X1/2 BEV (NEEDLE) IMPLANT
PACK CAROTID (CUSTOM PROCEDURE TRAY) ×3 IMPLANT
POSITIONER HEAD DONUT 9IN (MISCELLANEOUS) ×3 IMPLANT
PROTECTION STATION PRESSURIZED (MISCELLANEOUS) ×3
SET MICROPUNCTURE 5F STIFF (MISCELLANEOUS) ×3 IMPLANT
SHUNT CAROTID BYPASS 10 (VASCULAR PRODUCTS) IMPLANT
SHUNT CAROTID BYPASS 12FRX15.5 (VASCULAR PRODUCTS) IMPLANT
STATION PROTECTION PRESSURIZED (MISCELLANEOUS) ×2 IMPLANT
STENT TRANSCAROTID SYSTEM 9X40 (Permanent Stent) ×3 IMPLANT
SUT MNCRL AB 4-0 PS2 18 (SUTURE) ×3 IMPLANT
SUT PROLENE 5 0 C 1 24 (SUTURE) ×6 IMPLANT
SUT PROLENE 6 0 BV (SUTURE) IMPLANT
SUT PROLENE 7 0 BV 1 (SUTURE) IMPLANT
SUT SILK 2 0 PERMA HAND 18 BK (SUTURE) IMPLANT
SUT SILK 2 0 SH CR/8 (SUTURE) ×3 IMPLANT
SUT SILK 3 0 (SUTURE)
SUT SILK 3-0 18XBRD TIE 12 (SUTURE) IMPLANT
SUT VIC AB 3-0 SH 27 (SUTURE) ×3
SUT VIC AB 3-0 SH 27X BRD (SUTURE) ×2 IMPLANT
SYR 10ML LL (SYRINGE) ×9 IMPLANT
SYR 20ML LL LF (SYRINGE) ×3 IMPLANT
SYR CONTROL 10ML LL (SYRINGE) IMPLANT
SYSTEM TRANSCAROTID NEUROPRTCT (MISCELLANEOUS) ×2 IMPLANT
TOWEL GREEN STERILE (TOWEL DISPOSABLE) ×3 IMPLANT
TRANSCAROTID NEUROPROTECT SYS (MISCELLANEOUS) ×3
WATER STERILE IRR 1000ML POUR (IV SOLUTION) ×3 IMPLANT
WIRE STARTER BENTSON 035X150 (WIRE) ×3 IMPLANT

## 2020-07-07 NOTE — Anesthesia Procedure Notes (Signed)

## 2020-07-07 NOTE — Op Note (Signed)
Date: July 07, 2020  Preoperative diagnosis: Right high-grade asymptomatic carotid stenosis greater than 80%  Postoperative diagnosis: Same  Procedure: 1.  Ultrasound-guided access of left common femoral vein for delivery of venous sheath 2.  Right carotid angiogram 3.  Placement of right carotid stent with angioplasty after cutdown with percutaneous access with flow reversal for distal embolic protection, right TCAR  Surgeon: Dr. [name], MD  Assistant: Risa Grill, PA  Indications: Patient is a 65 year old male with significant cardiac history that was seen for bilateral high-grade asymptomatic carotid stenosis.  He previous underwent a left TCAR with me on 05/26/2020 with excellent results.  He presents today for treatment of the right carotid stenosis.  Risk benefits discussed.  An assistant was needed for exposure and to expedite the case.  Findings: Right internal carotid artery stenosis measured greater than 80%.  After cutdown on the right common carotid just above the clavicle the flow reversal sheath was placed.  Ultimately the right ICA lesion was somewhat difficult to cross and had to use a angled KMP catheter given the angulation of the internal carotid takeoff.  Ultimately after the lesion was crossed it was predilated with a 6 mm x 30 mm balloon and stented with a 9 mm x 40 mm stent with no residual stenosis.  Total flow reversal time was 15 minutes.  Total contrast 25 mL.  Anesthesia: General  Details: Patient was taken to the operating room after informed consent was obtained.  Placed on the operative table in supine position and general endotracheal anesthesia was induced.  Ultimately his right neck as well as bilateral groins were prepped and draped in the usual sterile fashion.  Timeout was performed and antibiotics were given.  I initially evaluated the left common femoral vein with ultrasound it was patent and image was saved.  This was accessed with micro  access needle, placed a microwire, and then a micro sheath under ultrasound guidance.  Ultimately placed a Bentson wire and then the venous flow reversal sheath was placed in the left common femoral vein. I then turned my attention to the right neck where a transverse incision was made 1 fingerbreadth above the right clavicle.  I dissected through the platysma with Bovie cautery and then used small wheat Lander's for added visualization.  I then found the avascular plane between the heads of the sternocleidomastoid that was divided and identified the right jugular vein.  This was then retracted lateral identifying the vagus nerve.  I did have to place a 5-0 prolene stitch in the internal jugular vein.  I was able to mobilize the left common carotid just above the clavicle and I was able to get both a vessel loop and a large umbilical tape around the artery.  Patient was given 100 units/kg IV heparin.  ACT's were checked to maintain greater than 250.  I then placed a 5-0 Prolene pursestring on the anterior wall of the right common carotid.  I then accessed the common carotid in the pursestring with a micro access needle and placed a microwire and then a micro sheath.  We then got a carotid angiogram to identify the carotid bifurcation and the right internal carotid stenosis.  He had a fairly long runway about 7 cm so we elected to stop short.  I then placed a J-wire below the carotid bifurcation and inserted the large arterial sheath into the right common carotid artery under fluoroscopy.  This was secured with multiple 3-0 silk sutures.  The  filter device was connected from the arterial sheath to the venous sheath.  We then performed a TCAR timeout after he got atropine and glycopyrrolate.  That point in time we went on active clamp with the common carotid artery just below the arterial sheath.  Ultimately I initially tried to cross the right internal carotid artery lesion but the wire kept going up the external  carotid artery.  I then had used an angled KMP catheter and I was finally able to cross the lesion into the ICA with an 0.014 wire.  The lesion was then predilated with a 6 mm x 30 mm balloon.  We then placed a 9 mm x 40 mm self expanding stent across the internal carotid lesion into the distal common carotid.  Final carotid angiogram showed no residual stenosis with good deployment of the stent.  We did allow active flow reversal for additional 2 minutes before coming off of our clamp.  Ultimately the filter device was disconnected after we came off clamp.  The arterial sheath in the common carotid was removed and I tied down the pursestring suture with good hemostasis.  I listened to the common carotid and had excellent Doppler flow.  At that point in time protamine was given for reversal.  Irrigated out the neck incision and used Surgicel snow for hemostasis.  Once protamine was given the left common femoral vein sheath was removed and pressure was held for 5 minutes.  The neck incision had good hemostasis and closed the platysma with 3-0 Vicryl and 4-0 Monocryl in the skin.  Dermabond was applied.  Awakened from anesthesia with no deficits.  Complication: None  Condition: Stable  [name], MD [facility] Office: [place]

## 2020-07-07 NOTE — Discharge Instructions (Signed)
   [facility]  Discharge Instructions   Carotid Endarterectomy (CEA)  Please refer to the following instructions for your post-procedure care. Your surgeon or physician assistant will discuss any changes with you.  Activity  You are encouraged to walk as much as you can. You can slowly return to normal activities but must avoid strenuous activity and heavy lifting until your doctor tell you it's OK. Avoid activities such as vacuuming or swinging a golf club. You can drive after one week if you are comfortable and you are no longer taking prescription pain medications. It is normal to feel tired for serval weeks after your surgery. It is also normal to have difficulty with sleep habits, eating, and bowel movements after surgery. These will go away with time.  Bathing/Showering  You may shower after you come home. Do not soak in a bathtub, hot tub, or swim until the incision heals completely.  Incision Care  Shower every day. Clean your incision with mild soap and water. Pat the area dry with a clean towel. You do not need a bandage unless otherwise instructed. Do not apply any ointments or creams to your incision. You may have skin glue on your incision. Do not peel it off. It will come off on its own in about one week. Your incision may feel thickened and raised for several weeks after your surgery. This is normal and the skin will soften over time. For Men Only: It's OK to shave around the incision but do not shave the incision itself for 2 weeks. It is common to have numbness under your chin that could last for several months.  Diet  Resume your normal diet. There are no special food restrictions following this procedure. A low fat/low cholesterol diet is recommended for all patients with vascular disease. In order to heal from your surgery, it is CRITICAL to get adequate nutrition. Your body requires vitamins, minerals, and protein. Vegetables are the best  source of vitamins and minerals. Vegetables also provide the perfect balance of protein. Processed food has little nutritional value, so try to avoid this.        Medications  Resume taking all of your medications unless your doctor or physician assistant tells you not to. If your incision is causing pain, you may take over-the- counter pain relievers such as acetaminophen (Tylenol). If you were prescribed a stronger pain medication, please be aware these medications can cause nausea and constipation. Prevent nausea by taking the medication with a snack or meal. Avoid constipation by drinking plenty of fluids and eating foods with a high amount of fiber, such as fruits, vegetables, and grains. Do not take Tylenol if you are taking prescription pain medications.  Follow Up  Our office will schedule a follow up appointment 2-3 weeks following discharge.  Please call us immediately for any of the following conditions  Increased pain, redness, drainage (pus) from your incision site. Fever of 101 degrees or higher. If you should develop stroke (slurred speech, difficulty swallowing, weakness on one side of your body, loss of vision) you should call 911 and go to the nearest emergency room.  Reduce your risk of vascular disease:  Stop smoking. If you would like help call [facility] at 1-800-QUIT-NOW (1-800-784-8669) or [facility] at 336-586-4000. Manage your cholesterol Maintain a desired weight Control your diabetes Keep your blood pressure down  If you have any questions, please call the office at [phone].   

## 2020-07-07 NOTE — Transfer of Care (Signed)
Immediate Anesthesia Transfer of Care Note  Patient: Deondre Marinaro  Procedure(s) Performed: RIGHT TRANSCAROTID ARTERY REVASCULARIZATION (Right: Neck) ULTRASOUND GUIDANCE FOR VASCULAR ACCESS (Left: Groin)  Patient Location: PACU  Anesthesia Type:General  Level of Consciousness: awake, alert  and oriented  Airway & Oxygen Therapy: Patient Spontanous Breathing  Post-op Assessment: Report given to RN, Post -op Vital signs reviewed and stable, Patient moving all extremities X 4 and Patient able to stick tongue midline  Post vital signs: Reviewed and stable  Last Vitals:  Vitals Value Taken Time  BP 143/81 07/07/20 0954  Temp    Pulse 70 07/07/20 0957  Resp 10 07/07/20 0957  SpO2 99 % 07/07/20 0957  Vitals shown include unvalidated device data.  Last Pain:  Vitals:   07/07/20 0659  TempSrc:   PainSc: 0-No pain         Complications: No notable events documented.

## 2020-07-07 NOTE — Anesthesia Procedure Notes (Signed)
Arterial Line Insertion Start/End6/13/2022 7:10 AM, 07/07/2020 7:20 AM Performed by: Harden Mo, CRNA, CRNA  Patient location: Pre-op. Preanesthetic checklist: patient identified, IV checked, site marked, risks and benefits discussed, surgical consent, monitors and equipment checked, pre-op evaluation and anesthesia consent Lidocaine 1% used for infiltration Left, radial was placed Catheter size: 20 Fr Hand hygiene performed  and maximum sterile barriers used   Attempts: 1 Procedure performed without using ultrasound guided technique. Following insertion, dressing applied and Biopatch. Post procedure assessment: normal and unchanged  Patient tolerated the procedure well with no immediate complications.

## 2020-07-07 NOTE — H&P (Signed)
History and Physical Interval Note:  07/07/2020 7:42 AM  Martin Bush  has presented today for surgery, with the diagnosis of BILATERAL CAROTID ARTERY STENOSIS.  The various methods of treatment have been discussed with the patient and family. After consideration of risks, benefits and other options for treatment, the patient has consented to  Procedure(s): RIGHT TRANSCAROTID ARTERY REVASCULARIZATION (Right) as a surgical intervention.  The patient's history has been reviewed, patient examined, no change in status, stable for surgery.  I have reviewed the patient's chart and labs.  Questions were answered to the patient's satisfaction.    Right TCAR  Martin Bush  Patient name: Martin Bush       MRN: [id number]        DOB: 30-Nov-1955        Sex: male   REASON FOR CONSULT: Post-op check after left TCAR   HPI: Martin Bush is a 65 y.o. male, with history of diabetes, coronary artery disease status post CABG as well as previous MI with ischemic cardiomyopathy and severe LV dysfunction, multiple previous PCI's, hypertension, hyperlipidemia that presents for postop check after left TCAR.  He was initially seen with bilateral high-grade carotid artery stenosis that was asymptomatic and referred by Dr. Einar Gip.  His left TCAR was on 05/26/20.  He has done well since surgery.  He describes no neurologic events since discharge.  Remains on aspirin Brilinta and statin.  No concerns today.       Past Medical History:  Diagnosis Date   AICD (automatic cardioverter/defibrillator) present      Medtronic   Arthritis      "thumbs"  (08/02/2017)   CHF (congestive heart failure) (HCC)     Chronic combined systolic and diastolic heart failure ([place])      a. 08/2017 Echo: EF 20-25%, mod glob HK. Sev distal ant sept, inflat HK. Apical AK. Gr2 DD. Mildly reduced RV fxn.   Colon polyps     Coronary artery disease      a. s/p CABG x 2 (LIMA->LAD, VG->OM); b. Multiple PCI's to LM/LCX/OM; c. 07/2017  PTCA of LM/LCX w/ early ISR-->repeat PCI/DES to LM (3.5x20 Synergy DES) and LCX (3.0x20 Synergy DES); d. 07/2018 Relook Cath: LM patent stent, LAD 100ost, LCX patent stent, OM1 99/60, LIMA->LAD ok. VG->dLCX 68 (old).   Depression     Encounter for assessment of implantable cardioverter-defibrillator (ICD) 09/27/2018   High cholesterol     History of hiatal hernia     Hypertension     ICD; Biventricular  Medtronic ICD Amplia MRI QWuad CRT-D  in situ 10/29/14 10/29/2014    Remote ICD check 09.23.20:  One 6 beat NSVT. No therapy.  1 SVT episode @ 130 bpm (38 Sec).  There were 23 Vent sense episodes detected for up to 1.1 min/day (AT). Health trends (patient activity, heart rate variability, average heart rates) are stable.Trans-thoracic impedance trends and the OptiVol Fluid Index do no present significant abnormalities. Battery longevity is 4.3 years. RA pacing is 3   Ischemic cardiomyopathy 09/27/2018   MI (myocardial infarction) [facility]) 2003   NSTEMI (non-ST elevated myocardial infarction) ([place]) 07/16/2014   OSA on CPAP     Oxygen deficiency     Peripheral vascular disease ([place])     Pneumonia 10/2014   Proteinuria     Sleep apnea     Type II diabetes mellitus (HCC)      insulin dependent           Past Surgical  History:  Procedure Laterality Date   BIOPSY   09/19/2018    Procedure: BIOPSY;  Surgeon: Thornton Park, MD;  [facility]: [facility];  Service: Gastroenterology;;   CARDIAC CATHETERIZATION N/A 07/16/2014    Procedure: Left Heart Cath and Coronary Angiography;  Surgeon: Adrian Prows, MD;  Location: [place] [facility];  Service: Cardiovascular;  Laterality: N/A;   CARDIAC CATHETERIZATION   07/16/2014    Procedure: Coronary Balloon Angioplasty;  Surgeon: Adrian Prows, MD;  Location: [place] [facility];  Service: Cardiovascular;;   CARDIAC CATHETERIZATION   2003   CARDIAC DEFIBRILLATOR PLACEMENT   2016   CATARACT EXTRACTION W/ INTRAOCULAR LENS IMPLANT Left 03/2014   COLONOSCOPY        COLONOSCOPY WITH PROPOFOL N/A 09/19/2018    Procedure: COLONOSCOPY WITH PROPOFOL;  Surgeon: Thornton Park, MD;  [facility];  Service: Gastroenterology;  Laterality: N/A;   CORONARY ANGIOPLASTY WITH STENT PLACEMENT        "I've got a total of 8 stents in there; mostly doine at [facility]" (08/02/2017)   CORONARY ARTERY BYPASS GRAFT   ~ 2003    "CABG X2"; [facility]   CORONARY ATHERECTOMY N/A 08/16/2017    Procedure: CORONARY ATHERECTOMY;  Surgeon: Nigel Mormon, MD;  Location: [place] [facility];  Service: Cardiovascular;  Laterality: N/A;   CORONARY BALLOON ANGIOPLASTY N/A 08/04/2017    Procedure: CORONARY BALLOON ANGIOPLASTY;  Surgeon: Adrian Prows, MD;  Location: [place] [facility];  Service: Cardiovascular;  Laterality: N/A;   CORONARY BALLOON ANGIOPLASTY N/A 01/11/2019    Procedure: CORONARY BALLOON ANGIOPLASTY;  Surgeon: Adrian Prows, MD;  Location: [place] [facility];  Service: Cardiovascular;  Laterality: N/A;   CORONARY BALLOON ANGIOPLASTY N/A 10/09/2019    Procedure: CORONARY BALLOON ANGIOPLASTY;  Surgeon: Adrian Prows, MD;  Location: [place] [facility];  Service: Cardiovascular;  Laterality: N/A;   CORONARY STENT INTERVENTION N/A 08/16/2017    Procedure: CORONARY STENT INTERVENTION;  Surgeon: Nigel Mormon, MD;  Location: [place] [facility];  Service: Cardiovascular;  Laterality: N/A;   CORONARY STENT INTERVENTION N/A 10/09/2019    Procedure: CORONARY STENT INTERVENTION;  Surgeon: Adrian Prows, MD;  Location: [place] [facility];  Service: Cardiovascular;  Laterality: N/A;   ELBOW SURGERY Left ?2001    "pinched nerve"   ENDOSCOPIC MUCOSAL RESECTION   12/18/2018    Procedure: ENDOSCOPIC MUCOSAL RESECTION;  Surgeon: Rush Landmark Telford Nab., MD;  Location: [place];  Service: Gastroenterology;;   Otho Darner SIGMOIDOSCOPY N/A 12/18/2018    Procedure: Beryle Quant;  Surgeon: Irving Copas., MD;  Location: [place];  Service: Gastroenterology;   Laterality: N/A;   HEMOSTASIS CLIP PLACEMENT   12/18/2018    Procedure: HEMOSTASIS CLIP PLACEMENT;  Surgeon: Irving Copas., MD;  Location: [place];  Service: Gastroenterology;;   LEFT HEART CATH AND CORONARY ANGIOGRAPHY N/A 01/11/2019    Procedure: LEFT HEART CATH AND CORONARY ANGIOGRAPHY;  Surgeon: Adrian Prows, MD;  Location: [place] [facility];  Service: Cardiovascular;  Laterality: N/A;   LEFT HEART CATH AND CORS/GRAFTS ANGIOGRAPHY N/A 08/04/2017    Procedure: LEFT HEART CATH AND CORS/GRAFTS ANGIOGRAPHY;  Surgeon: Adrian Prows, MD;  Location: [place] [facility];  Service: Cardiovascular;  Laterality: N/A;   LEFT HEART CATH AND CORS/GRAFTS ANGIOGRAPHY N/A 08/20/2017    Procedure: LEFT HEART CATH AND CORS/GRAFTS ANGIOGRAPHY;  Surgeon: Nigel Mormon, MD;  Location: [place] [facility];  Service: Cardiovascular;  Laterality: N/A;   LEFT HEART CATH AND CORS/GRAFTS ANGIOGRAPHY N/A  01/08/2019    Procedure: LEFT HEART CATH AND CORS/GRAFTS ANGIOGRAPHY;  Surgeon: [place] Hampshire, MD;  Location: [place] [facility];  Service: Cardiovascular;  Laterality: N/A;   LEFT HEART CATH AND CORS/GRAFTS ANGIOGRAPHY N/A 10/09/2019    Procedure: LEFT HEART CATH AND CORS/GRAFTS ANGIOGRAPHY;  Surgeon: Adrian Prows, MD;  Location: [place] [facility];  Service: Cardiovascular;  Laterality: N/A;   LEFT HEART CATH AND CORS/GRAFTS ANGIOGRAPHY N/A 12/11/2019    Procedure: LEFT HEART CATH AND CORS/GRAFTS ANGIOGRAPHY;  Surgeon: Nigel Mormon, MD;  Location: [place] [facility];  Service: Cardiovascular;  Laterality: N/A;   POLYPECTOMY   09/19/2018    Procedure: POLYPECTOMY;  Surgeon: Thornton Park, MD;  [facility];  Service: Gastroenterology;;   Lia Foyer INJECTION   09/19/2018    Procedure: SUBMUCOSAL INJECTION;  Surgeon: Thornton Park, MD;  [facility];  Service: Gastroenterology;;   Lia Foyer LIFTING INJECTION   12/18/2018    Procedure: SUBMUCOSAL LIFTING INJECTION;   Surgeon: Irving Copas., MD;  Location: [place];  Service: Gastroenterology;;   TRANSCAROTID ARTERY REVASCULARIZATION  Left 05/26/2020    Procedure: LEFT TRANSCAROTID ARTERY REVASCULARIZATION;  Surgeon: Martin Heck, MD;  Location: [place];  Service: Vascular;  Laterality: Left;   ULTRASOUND GUIDANCE FOR VASCULAR ACCESS Right 05/26/2020    Procedure: ULTRASOUND GUIDANCE FOR VASCULAR ACCESS;  Surgeon: Martin Heck, MD;  Location: [place];  Service: Vascular;  Laterality: Right;           Family History  Problem Relation Age of Onset   Uterine cancer Mother     Lung cancer Mother     Hyperlipidemia Father     Heart disease Father     Hypertension Father     Diabetes Father     Colon cancer Neg Hx        SOCIAL HISTORY: Social History         Socioeconomic History   Marital status: Married      Spouse name: Not on file   Number of children: 3   Years of education: Not on file   Highest education level: Not on file  Occupational History   Not on file  Tobacco Use   Smoking status: Former Smoker      Packs/day: 0.25      Years: 27.00      Pack years: 6.75      Types: Cigars      Quit date: 2002      Years since quitting: 20.4   Smokeless tobacco: Former Systems developer      Types: Chew      Quit date: 2002  Vaping Use   Vaping Use: Never used  Substance and Sexual Activity   Alcohol use: Not Currently   Drug use: Never   Sexual activity: Not Currently  Other Topics Concern   Not on file  Social History Narrative   Not on file    Social Determinants of Health       Financial Resource Strain: Low Risk   Difficulty of Paying Living Expenses: Not hard at all  Food Insecurity: No Food Insecurity   Worried About Charity fundraiser in the Last Year: Never true   Arboriculturist in the Last Year: Never true  Transportation Needs: No Transportation Needs   Lack of Transportation (Medical): No   Lack of Transportation (Non-Medical): No  Physical Activity:  Not on file  Stress: No Stress Concern Present   Feeling of Stress :  Not at all  Social Connections: Not on file  Intimate Partner Violence: Not At Risk   Fear of Current or Ex-Partner: No   Emotionally Abused: No   Physically Abused: No   Sexually Abused: No          Allergies  Allergen Reactions   Diltiazem Rash            Current Outpatient Medications  Medication Sig Dispense Refill   amiodarone (PACERONE) 200 MG tablet Take 1 tablet (200 mg total) by mouth daily.       aspirin EC 81 MG EC tablet Take 1 tablet (81 mg total) by mouth daily.       Cholecalciferol (VITAMIN D3) 50 MCG (2000 UT) TABS Take 2,000 Units by mouth 2 (two) times daily.       Cyanocobalamin 2500 MCG CHEW Chew 2,500 mcg by mouth daily.       insulin aspart protamine- aspart (NOVOLOG MIX 70/30) (70-30) 100 UNIT/ML injection Inject 0.66 mLs (66 Units total) into the skin daily with breakfast AND 0.6 mLs (60 Units total) daily with supper. (Patient taking differently: Take as needed of blood glucose is over 130 take 60 units) 10 mL 11   metFORMIN (GLUCOPHAGE-XR) 500 MG 24 hr tablet Take 500 mg by mouth 2 (two) times daily.       nitroGLYCERIN (NITROSTAT) 0.4 MG SL tablet Place 1 tablet (0.4 mg total) under the tongue every 5 (five) minutes x 3 doses as needed for chest pain. 25 tablet 3   Omega-3 Fatty Acids (FISH OIL PO) Take 2 capsules by mouth 3 (three) times daily. 500 mg /100 mg       oxyCODONE-acetaminophen (PERCOCET) 5-325 MG tablet Take 1 tablet by mouth every 6 (six) hours as needed for severe pain. 8 tablet 0   OXYGEN Inhale 2 L/min into the lungs See admin instructions. 2 L/min at bedtime in conjunction with CPAP       potassium chloride SA (KLOR-CON) 20 MEQ tablet Take 1 tablet (20 mEq total) by mouth 2 (two) times daily. 180 tablet 3   PRESCRIPTION MEDICATION Inhale into the lungs See admin instructions. CPAP- At bedtime       rosuvastatin (CRESTOR) 40 MG tablet Take 1 tablet (40 mg total) by mouth  at bedtime.       sacubitril-valsartan (ENTRESTO) 97-103 MG Take 1 tablet by mouth 2 (two) times daily. 60 tablet     Semaglutide, 1 MG/DOSE, 2 MG/1.5ML SOPN Inject 1 mg into the skin once a week.       sertraline (ZOLOFT) 100 MG tablet Take 50 mg by mouth daily.       ticagrelor (BRILINTA) 90 MG TABS tablet Take 1 tablet (90 mg total) by mouth 2 (two) times daily. 60 tablet     torsemide (DEMADEX) 20 MG tablet Take 1 tablet (20 mg total) by mouth daily as needed (for an overnight weight gain of 3 pounds or more). 90 tablet 3   carvedilol (COREG) 12.5 MG tablet Take 2 tablets (25 mg total) by mouth 2 (two) times daily. (Patient taking differently: Take 12.5 mg by mouth 2 (two) times daily.) 180 tablet 3    No current facility-administered medications for this visit.      REVIEW OF SYSTEMS:  [X]  denotes positive finding, [ ]  denotes negative finding Cardiac   Comments:  Chest pain or chest pressure:      Shortness of breath upon exertion:      Short of breath  when lying flat:      Irregular heart rhythm:             Vascular      Pain in calf, thigh, or hip brought on by ambulation:      Pain in feet at night that wakes you up from your sleep:      Blood clot in your veins:      Leg swelling:             Pulmonary      Oxygen at home:      Productive cough:      Wheezing:             Neurologic      Sudden weakness in arms or legs:      Sudden numbness in arms or legs:      Sudden onset of difficulty speaking or slurred speech:      Temporary loss of vision in one eye:      Problems with dizziness:             Gastrointestinal      Blood in stool:      Vomited blood:             Genitourinary      Burning when urinating:      Blood in urine:             Psychiatric      Major depression:             Hematologic      Bleeding problems:      Problems with blood clotting too easily:             Skin      Rashes or ulcers:             Constitutional      Fever or  chills:          PHYSICAL EXAM:    Vitals:    06/24/20 1025  BP: (!) 153/89  Pulse: 63  Temp: (!) 97.4 F (36.3 C)  TempSrc: Skin  SpO2: 96%  Weight: 220 lb 8 oz (100 kg)  Height: 5\' 7"  (1.702 m)      GENERAL: The patient is a well-nourished male, in no acute distress. The vital signs are documented above. CARDIAC: There is a regular rate and rhythm. VASCULAR:  Left neck incision c/d/i NEUROLOGIC: No focal weakness or paresthesias are detected.  Cranial nerves II through XII grossly intact     DATA:    CTA neck 05/12/20 shows bilateral high-grade carotid artery stenosis in the ICA.   Carotid duplex today shows widely patent left carotid stent.   Assessment/Plan:   65 year old male presents for postop check after left TCAR for an asymptomatic high-grade greater than 80% stenosis performed on 05/26/20.  He has done very well since surgery and is recovering appropriately.  Carotid duplex today shows a widely patent left ICA stent.  He has known contralateral high-grade stenosis on the right that is asymptomatic as well.  We have already sized him for a right TCAR based on his previous CT scan.  We will get him scheduled for right TCAR in June for asymptomatic high-grade stenosis on the right.  Discussed he remain on aspirin statin and Brilinta.  Risk benefits again reviewed.     Martin Heck, MD [facility] Office: [phone]

## 2020-07-07 NOTE — Plan of Care (Signed)
  Problem: Activity: Goal: Ability to return to baseline activity level will improve 07/07/2020 1343 by Lasandra Beech, RN Outcome: Progressing 07/07/2020 1340 by Lasandra Beech, RN Outcome: Progressing

## 2020-07-07 NOTE — Plan of Care (Signed)
  Problem: Activity: Goal: Ability to return to baseline activity level will improve Outcome: Progressing   

## 2020-07-07 NOTE — Progress Notes (Addendum)
  Day of Surgery Note    Subjective:  Called to bedside in PACU to assess incision. He is awake and alert without complaints.   Vitals:   07/07/20 1040 07/07/20 1055  BP: (!) 143/76 (!) 142/73  Pulse: 65 64  Resp: 14 13  Temp:    SpO2: 98% 95%    Incisions:   Firm to palpation just under skin at incision. Surrounding tissues soft Extremities:  5/5 hand grip strength. Moves upper and lower extremities well. Cardiac:  RRR Lungs:  non-labored Neuro: A and O times 4. Speech clear. Tongue midline. Face symmetric.   Assessment/Plan:  This is a 65 y.o. male who is s/p right TCAR with minimal incisional swelling. Dr. Stephani Police hold manual pressure for 10 minutes and continue to monitor VSS.   Risa Grill, PA-C 07/07/2020 11:09 AM [phone]

## 2020-07-08 ENCOUNTER — Encounter (HOSPITAL_COMMUNITY): Payer: Self-pay | Admitting: Vascular Surgery

## 2020-07-08 LAB — CBC
HCT: 30.3 % — ABNORMAL LOW (ref 39.0–52.0)
Hemoglobin: 10 g/dL — ABNORMAL LOW (ref 13.0–17.0)
MCH: 29.4 pg (ref 26.0–34.0)
MCHC: 33 g/dL (ref 30.0–36.0)
MCV: 89.1 fL (ref 80.0–100.0)
Platelets: 180 10*3/uL (ref 150–400)
RBC: 3.4 MIL/uL — ABNORMAL LOW (ref 4.22–5.81)
RDW: 13.1 % (ref 11.5–15.5)
WBC: 12.6 10*3/uL — ABNORMAL HIGH (ref 4.0–10.5)
nRBC: 0 % (ref 0.0–0.2)

## 2020-07-08 LAB — LIPID PANEL
Cholesterol: 101 mg/dL (ref 0–200)
HDL: 38 mg/dL — ABNORMAL LOW (ref >40)
LDL Cholesterol: 50 mg/dL (ref 0–99)
Total CHOL/HDL Ratio: 2.7 RATIO
Triglycerides: 65 mg/dL (ref <150)
VLDL: 13 mg/dL (ref 0–40)

## 2020-07-08 LAB — BASIC METABOLIC PANEL
Anion gap: 7 (ref 5–15)
BUN: 24 mg/dL — ABNORMAL HIGH (ref 8–23)
CO2: 21 mmol/L — ABNORMAL LOW (ref 22–32)
Calcium: 8.5 mg/dL — ABNORMAL LOW (ref 8.9–10.3)
Chloride: 100 mmol/L (ref 98–111)
Creatinine, Ser: 1.54 mg/dL — ABNORMAL HIGH (ref 0.61–1.24)
GFR, Estimated: 50 mL/min — ABNORMAL LOW (ref >60)
Glucose, Bld: 233 mg/dL — ABNORMAL HIGH (ref 70–99)
Potassium: 4.4 mmol/L (ref 3.5–5.1)
Sodium: 128 mmol/L — ABNORMAL LOW (ref 135–145)

## 2020-07-08 LAB — GLUCOSE, CAPILLARY: Glucose-Capillary: 185 mg/dL — ABNORMAL HIGH (ref 70–99)

## 2020-07-08 MED ORDER — HYDROCODONE-ACETAMINOPHEN 5-325 MG PO TABS: 1.0000 | ORAL_TABLET | ORAL | 0 refills | Status: DC | PRN

## 2020-07-08 NOTE — Progress Notes (Signed)
Discharge instructions (including medications) discussed with and copy provided to patient/caregiver 

## 2020-07-08 NOTE — Anesthesia Postprocedure Evaluation (Signed)
Anesthesia Post Note  Patient: Martin Bush  Procedure(s) Performed: RIGHT TRANSCAROTID ARTERY REVASCULARIZATION (Right: Neck) ULTRASOUND GUIDANCE FOR VASCULAR ACCESS (Left: Groin)     Patient location during evaluation: PACU Anesthesia Type: General Level of consciousness: sedated and patient cooperative Pain management: pain level controlled Vital Signs Assessment: post-procedure vital signs reviewed and stable Respiratory status: spontaneous breathing Cardiovascular status: stable Anesthetic complications: no   No notable events documented.  Last Vitals:  Vitals:   07/08/20 0400 07/08/20 0759  BP: (!) 104/54 131/79  Pulse: 69 71  Resp: 15 17  Temp: 36.7 C 36.5 C  SpO2: 95% 96%    Last Pain:  Vitals:   07/08/20 1011  TempSrc:   PainSc: 0-No pain                 Nolon Nations

## 2020-07-08 NOTE — Discharge Summary (Signed)
Discharge Summary     Martin Bush 04-30-1955 65 y.o. male  [id number]  Admission Date: 07/07/2020  Discharge Date: 07/08/2020 Physician: [name], MD  Admission Diagnosis: Carotid stenosis, right [I65.21]   HPI:   This is a 65 y.o. male with significant cardiac history that was seen for bilateral high-grade asymptomatic carotid stenosis.  He previously underwent a left TCAR with Dr. Carlis Abbott on 05/26/2020 with excellent results.  Hospital Course:  The patient was admitted to the hospital and taken to the operating room on 07/07/2020 and underwent right transcarotid artery revascularization.    Findings:  Right internal carotid artery stenosis measured greater than 80%.  After cutdown on the right common carotid just above the clavicle the flow reversal sheath was placed.  Ultimately the right ICA lesion was somewhat difficult to cross and had to use a angled KMP catheter given the angulation of the internal carotid takeoff.  Ultimately after the lesion was crossed it was predilated with a 6 mm x 30 mm balloon and stented with a 9 mm x 40 mm stent with no residual stenosis.  Total flow reversal time was 15 minutes.  Total contrast 25 mL.  The pt tolerated the procedure well and was transported to the PACU in excellent condition.   By POD 1, the pt neuro status was intact. His vital signs were stable and his pain was controlled. He was voiding spontaneously and tolerated his diet. Lab work was unremarkable. He was ambulating without difficulty.  The remainder of the hospital course consisted of increasing mobilization and increasing intake of solids without difficulty.   Recent Labs    07/08/20 0146  NA 128*  K 4.4  CL 100  CO2 21*  GLUCOSE 233*  BUN 24*  CALCIUM 8.5*   Recent Labs    07/08/20 0146  WBC 12.6*  HGB 10.0*  HCT 30.3*  PLT 180   No results for input(s): INR in the last 72 hours.   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 ([place]) 12/09/2019   Skin irritation 10/10/2019   Acute pulmonary edema ([place]) 10/09/2019   Left main coronary artery disease    Stenosis of left circumflex coronary artery    Non-ST elevation (NSTEMI) myocardial infarction ([place]) 01/04/2019   Hx of CABG 01/04/2019   Acute respiratory failure ([place]) 01/04/2019   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   History of colonic polyps 11/12/2018   Abnormal colonoscopy 11/12/2018   Ischemic cardiomyopathy 09/27/2018   Encounter for assessment of implantable cardioverter-defibrillator (ICD) 09/27/2018   Enrolled in clinical trial of drug 06/13/2018   Weight loss 09/19/2017   Ventricular tachycardia ([place])    ICD (implantable cardioverter-defibrillator) discharge 08/02/2017   Obesity 06/17/2017   CHF (congestive heart failure) ([place]) 07/23/2015   Hyperlipidemia 07/11/2015   HTN (hypertension), benign 07/11/2015   Coronary artery disease involving autologous artery coronary bypass graft without angina pectoris 07/11/2015   Depression 07/11/2015   CKD (chronic kidney disease) stage 3, GFR 30-59 ml/min (HCC) 07/11/2015   OSA on CPAP 07/11/2015  Lightheadedness 07/11/2015   Carotid artery stenosis    S/P eye surgery 05/15/2015   Injury of left toe 04/27/2015   Chronic low back pain 02/13/2015   Insulin dependent type 2 diabetes mellitus ([place]) 02/13/2015   Dizziness 02/05/2015   ICD; Biventricular  Medtronic ICD Amplia MRI QWuad CRT-D  in situ 10/29/14 10/29/2014   Past Medical History:  Diagnosis Date   AICD (automatic  cardioverter/defibrillator) present    Medtronic   Arthritis    "thumbs"  (08/02/2017)   CHF (congestive heart failure) (HCC)    Chronic combined systolic and diastolic heart failure ([place])    a. 08/2017 Echo: EF 20-25%, mod glob HK. Sev distal ant sept, inflat HK. Apical AK. Gr2 DD. Mildly reduced RV fxn.   Colon polyps    Coronary artery disease    a. s/p CABG x 2 (LIMA->LAD, VG->OM); b. Multiple PCI's to LM/LCX/OM; c. 07/2017 PTCA of LM/LCX w/ early ISR-->repeat PCI/DES to LM (3.5x20 Synergy DES) and LCX (3.0x20 Synergy DES); d. 07/2018 Relook Cath: LM patent stent, LAD 100ost, LCX patent stent, OM1 99/60, LIMA->LAD ok. VG->dLCX 12 (old).   Depression    Encounter for assessment of implantable cardioverter-defibrillator (ICD) 09/27/2018   High cholesterol    History of hiatal hernia    Hypertension    ICD; Biventricular  Medtronic ICD Amplia MRI QWuad CRT-D  in situ 10/29/14 10/29/2014   Remote ICD check 09.23.20:  One 6 beat NSVT. No therapy.  1 SVT episode @ 130 bpm (38 Sec).  There were 23 Vent sense episodes detected for up to 1.1 min/day (AT). Health trends (patient activity, heart rate variability, average heart rates) are stable.Trans-thoracic impedance trends and the OptiVol Fluid Index do no present significant abnormalities. Battery longevity is 4.3 years. RA pacing is 3   Ischemic cardiomyopathy 09/27/2018   MI (myocardial infarction) ([place]) 2003   NSTEMI (non-ST elevated myocardial infarction) ([place]) 07/16/2014   OSA on CPAP    Oxygen deficiency    Peripheral vascular disease ([place])    Pneumonia 10/2014   Proteinuria    Sleep apnea    Type II diabetes mellitus (HCC)    insulin dependent    Allergies as of 07/08/2020       Reactions   Diltiazem Rash        Medication List     STOP taking these medications    oxyCODONE-acetaminophen 5-325 MG tablet Commonly known as: Percocet       TAKE these medications    amiodarone 200 MG tablet Commonly known as: PACERONE Take 1  tablet (200 mg total) by mouth daily.   aspirin 81 MG EC tablet Take 1 tablet (81 mg total) by mouth daily.   carvedilol 12.5 MG tablet Commonly known as: COREG Take 12.5 mg by mouth 2 (two) times daily with a meal.   Cyanocobalamin 2500 MCG Chew Chew 2,500 mcg by mouth daily.   Fish Oil 1000 MG Caps Take 2,000 mg by mouth 3 (three) times daily.   HYDROcodone-acetaminophen 5-325 MG tablet Commonly known as: NORCO/VICODIN Take 1 tablet by mouth every 4 (four) hours as needed for moderate pain.   metFORMIN 500 MG 24 hr tablet Commonly known as: GLUCOPHAGE-XR Take 500 mg by mouth 2 (two) times daily.   nitroGLYCERIN 0.4 MG SL tablet Commonly known as: NITROSTAT Place 1 tablet (0.4 mg total) under the tongue every 5 (five) minutes x 3 doses as needed for chest pain.   NovoLIN 70/30 FlexPen (70-30) 100 UNIT/ML KwikPen Generic  drug: insulin isophane & regular human Inject 30-63 Units into the skin 2 (two) times daily as needed (high blood sugar).   OXYGEN Inhale 2 L/min into the lungs See admin instructions. 2 L/min at bedtime in conjunction with CPAP   potassium chloride SA 20 MEQ tablet Commonly known as: KLOR-CON Take 1 tablet (20 mEq total) by mouth 2 (two) times daily.   PRESCRIPTION MEDICATION Inhale into the lungs See admin instructions. CPAP- At bedtime   rosuvastatin 40 MG tablet Commonly known as: CRESTOR Take 1 tablet (40 mg total) by mouth at bedtime.   sacubitril-valsartan 97-103 MG Commonly known as: ENTRESTO Take 1 tablet by mouth 2 (two) times daily.   Semaglutide (1 MG/DOSE) 2 MG/1.5ML Sopn Inject 1 mg into the skin every Sunday.   sertraline 100 MG tablet Commonly known as: ZOLOFT Take 50 mg by mouth daily.   ticagrelor 90 MG Tabs tablet Commonly known as: BRILINTA Take 1 tablet (90 mg total) by mouth 2 (two) times daily.   torsemide 20 MG tablet Commonly known as: DEMADEX Take 1 tablet (20 mg total) by mouth daily as needed (for an overnight  weight gain of 3 pounds or more).   Vitamin D3 50 MCG (2000 UT) Tabs Take 2,000 Units by mouth 2 (two) times daily.       ASK your doctor about these medications    insulin aspart protamine- aspart (70-30) 100 UNIT/ML injection Commonly known as: NOVOLOG MIX 70/30 Inject 0.66 mLs (66 Units total) into the skin daily with breakfast AND 0.6 mLs (60 Units total) daily with supper.         [facility] Discharge Instructions Carotid Endarterectomy (CEA)  Please refer to the following instructions for your post-procedure care. Your surgeon or physician assistant will discuss any changes with you.  Activity  You are encouraged to walk as much as you can. You can slowly return to normal activities but must avoid strenuous activity and heavy lifting until your doctor tell you it's OK. Avoid activities such as vacuuming or swinging a golf club. You can drive after one week if you are comfortable and you are no longer taking prescription pain medications. It is normal to feel tired for serval weeks after your surgery. It is also normal to have difficulty with sleep habits, eating, and bowel movements after surgery. These will go away with time.  Bathing/Showering  You may shower after you come home. Do not soak in a bathtub, hot tub, or swim until the incision heals completely.  Incision Care  Shower every day. Clean your incision with mild soap and water. Pat the area dry with a clean towel. You do not need a bandage unless otherwise instructed. Do not apply any ointments or creams to your incision. You may have skin glue on your incision. Do not peel it off. It will come off on its own in about one week. Your incision may feel thickened and raised for several weeks after your surgery. This is normal and the skin will soften over time. For Men Only: It's OK to shave around the incision but do not shave the incision itself for 2 weeks. It is common to have  numbness under your chin that could last for several months.  Diet  Resume your normal diet. There are no special food restrictions following this procedure. A low fat/low cholesterol diet is recommended for all patients with vascular disease. In order to heal from your surgery, it is CRITICAL  to get adequate nutrition. Your body requires vitamins, minerals, and protein. Vegetables are the best source of vitamins and minerals. Vegetables also provide the perfect balance of protein. Processed food has little nutritional value, so try to avoid this.  Medications  Resume taking all of your medications unless your doctor or physician assistant tells you not to.  If your incision is causing pain, you may take over-the- counter pain relievers such as acetaminophen (Tylenol). If you were prescribed a stronger pain medication, please be aware these medications can cause nausea and constipation.  Prevent nausea by taking the medication with a snack or meal. Avoid constipation by drinking plenty of fluids and eating foods with a high amount of fiber, such as fruits, vegetables, and grains.  Do not take Tylenol if you are taking prescription pain medications.  Follow Up  Our office will schedule a follow up appointment 2-3 weeks following discharge.  Please call us immediately for any of the following conditions  Increased pain, redness, drainage (pus) from your incision site. Fever of 101 degrees or higher. If you should develop stroke (slurred speech, difficulty swallowing, weakness on one side of your body, loss of vision) you should call 911 and go to the nearest emergency room.  Reduce your risk of vascular disease:  Stop smoking. If you would like help call [facility] at 1-800-QUIT-NOW [phone]) or [place] at [phone]. Manage your cholesterol Maintain a desired weight Control your diabetes Keep your blood pressure down  If you have any questions, please call the office at  [phone].  Prescriptions given: 1.   Roxicet #20 No Refill   Disposition: home  Patient's condition: is Excellent  Follow up: 1. Dr. Carlis Abbott in 4 weeks.   Risa Grill, PA-C [facility] [phone]   --- For [facility] use ---   Modified Rankin score at D/C (0-6): 0  IV medication needed for:  1. Hypertension: No 2. Hypotension: No  Post-op Complications: No  1. Post-op CVA or TIA: No  If yes: Event classification (right eye, left eye, right cortical, left cortical, verterobasilar, other):   If yes: Timing of event (intra-op, <6 hrs post-op, >=6 hrs post-op, unknown):   2. CN injury: No  If yes: CN n/a injuried   3. Myocardial infarction: No  If yes: Dx by (EKG or clinical, Troponin):   4.  CHF: No  5.  Dysrhythmia (new): No  6. Wound infection: No  7. Reperfusion symptoms: No  8. Return to OR: No  If yes: return to OR for (bleeding, neurologic, other CEA incision, other):   Discharge medications: Statin use:  Yes ASA use:  Yes   Beta blocker use:  Yes ACE-Inhibitor use:  No  ARB use:  Yes CCB use: No P2Y12 Antagonist use: No, [ ]  Plavix, [ ]  Plasugrel, [ ]  Ticlopinine, [ x] Ticagrelor, [ ]  Other, [ ]  No for medical reason, [ ]  Non-compliant, [ ]  Not-indicated Anti-coagulant use:  No, [ ]  Warfarin, [ ]  Rivaroxaban, [ ]  Dabigatran,

## 2020-07-08 NOTE — Plan of Care (Signed)
  Problem: Activity: Goal: Ability to return to baseline activity level will improve Outcome: Adequate for Discharge

## 2020-07-08 NOTE — Progress Notes (Addendum)
Progress Note    07/08/2020 7:07 AM 1 Day Post-Op  Subjective:  No complaints   Vitals:   07/07/20 2307 07/08/20 0400  BP: 132/70 (!) 104/54  Pulse: 73 69  Resp: 15 15  Temp: 98.1 F (36.7 C) 98 F (36.7 C)  SpO2: 99% 95%    Physical Exam: General appearance: Awake, alert in no apparent distress Neurologic: Alert and oriented x4, tongue midline, face symmetric, grip strength 5/5 bilaterally Cardiac: Heart rate and rhythm are regular Respirations: Nonlabored Incision: Well approximated without bleeding or hematoma.  Subcutaneous tissue soft to palpation Groin puncture site: Soft without hematoma or bleeding      CBC    Component Value Date/Time   WBC 12.6 (H) 07/08/2020 0146   RBC 3.40 (L) 07/08/2020 0146   HGB 10.0 (L) 07/08/2020 0146   HCT 30.3 (L) 07/08/2020 0146   PLT 180 07/08/2020 0146   MCV 89.1 07/08/2020 0146   MCH 29.4 07/08/2020 0146   MCHC 33.0 07/08/2020 0146   RDW 13.1 07/08/2020 0146   LYMPHSABS 0.5 (L) 12/09/2019 0400   MONOABS 0.9 12/09/2019 0400   EOSABS 0.0 12/09/2019 0400   BASOSABS 0.1 12/09/2019 0400    BMET    Component Value Date/Time   NA 128 (L) 07/08/2020 0146   NA 141 12/06/2019 1431   K 4.4 07/08/2020 0146   CL 100 07/08/2020 0146   CO2 21 (L) 07/08/2020 0146   GLUCOSE 233 (H) 07/08/2020 0146   BUN 24 (H) 07/08/2020 0146   BUN 17 12/06/2019 1431   CREATININE 1.54 (H) 07/08/2020 0146   CALCIUM 8.5 (L) 07/08/2020 0146   GFRNONAA 50 (L) 07/08/2020 0146   GFRAA 67 12/06/2019 1431     Intake/Output Summary (Last 24 hours) at 07/08/2020 0707 Last data filed at 07/08/2020 0505 Gross per 24 hour  Intake 2220 ml  Output 1300 ml  Net 920 ml    HOSPITAL MEDICATIONS Scheduled Meds:  amiodarone  200 mg Oral Daily   aspirin EC  81 mg Oral Daily   carvedilol  12.5 mg Oral BID WC   docusate sodium  100 mg Oral Daily   insulin aspart  0-15 Units Subcutaneous TID WC   metFORMIN  500 mg Oral BID WC   pantoprazole  40 mg Oral  Daily   potassium chloride SA  20 mEq Oral BID   rosuvastatin  40 mg Oral QHS   sacubitril-valsartan  1 tablet Oral BID   sertraline  50 mg Oral Daily   ticagrelor  90 mg Oral BID   Continuous Infusions:  sodium chloride     sodium chloride Stopped (07/07/20 1534)   magnesium sulfate bolus IVPB     PRN Meds:.sodium chloride, acetaminophen **OR** acetaminophen, alum & mag hydroxide-simeth, bisacodyl, guaiFENesin-dextromethorphan, hydrALAZINE, labetalol, magnesium sulfate bolus IVPB, metoprolol tartrate, morphine injection, ondansetron, oxyCODONE-acetaminophen, phenol, polyethylene glycol, potassium chloride, torsemide  Assessment and Plan: Placement of right carotid stent with angioplasty after cutdown with percutaneous access with flow reversal for distal embolic protection, right TCAR. Hemodynamically stable and neurologically intact. POD 1   Discharge home. Continue aspirin and statin. On Brilinta.  Follow-up in 4 weeks with duplex  -DVT prophylaxis:  SCDs   Risa Grill, PA-C [facility] [phone] 07/08/2020  7:07 AM   I have seen and evaluated the patient. I agree with the PA note as documented above.  Postop day 1 status post right TCAR for an asymptomatic high-grade greater than 80% stenosis.  He looks good this  morning and is neurologically intact.  He has some bruising along the incision above the right clavicle.  Plan discharge home today and is neurologically intact.  Continue aspirin statin and Brilinta as discussed with him.  We will arrange follow-up with me in 1 month with bilateral carotid duplex given he is now status post bilateral TCAR.  Discussed he call our office with any questions or concerns.  [name], MD [facility] [place] Office: [phone]

## 2020-07-09 ENCOUNTER — Other Ambulatory Visit: Payer: Self-pay | Admitting: *Deleted

## 2020-07-09 DIAGNOSIS — I6523 Occlusion and stenosis of bilateral carotid arteries: Secondary | ICD-10-CM

## 2020-07-11 IMAGING — DX DG HAND COMPLETE 3+V*L*
3 series · 3 of 3 positions shown · non-contrast
Comparison: None.

CLINICAL DATA: Hand pain since a fall yesterday.

EXAM:
LEFT HAND - COMPLETE 3+ VIEW

[hand pa]
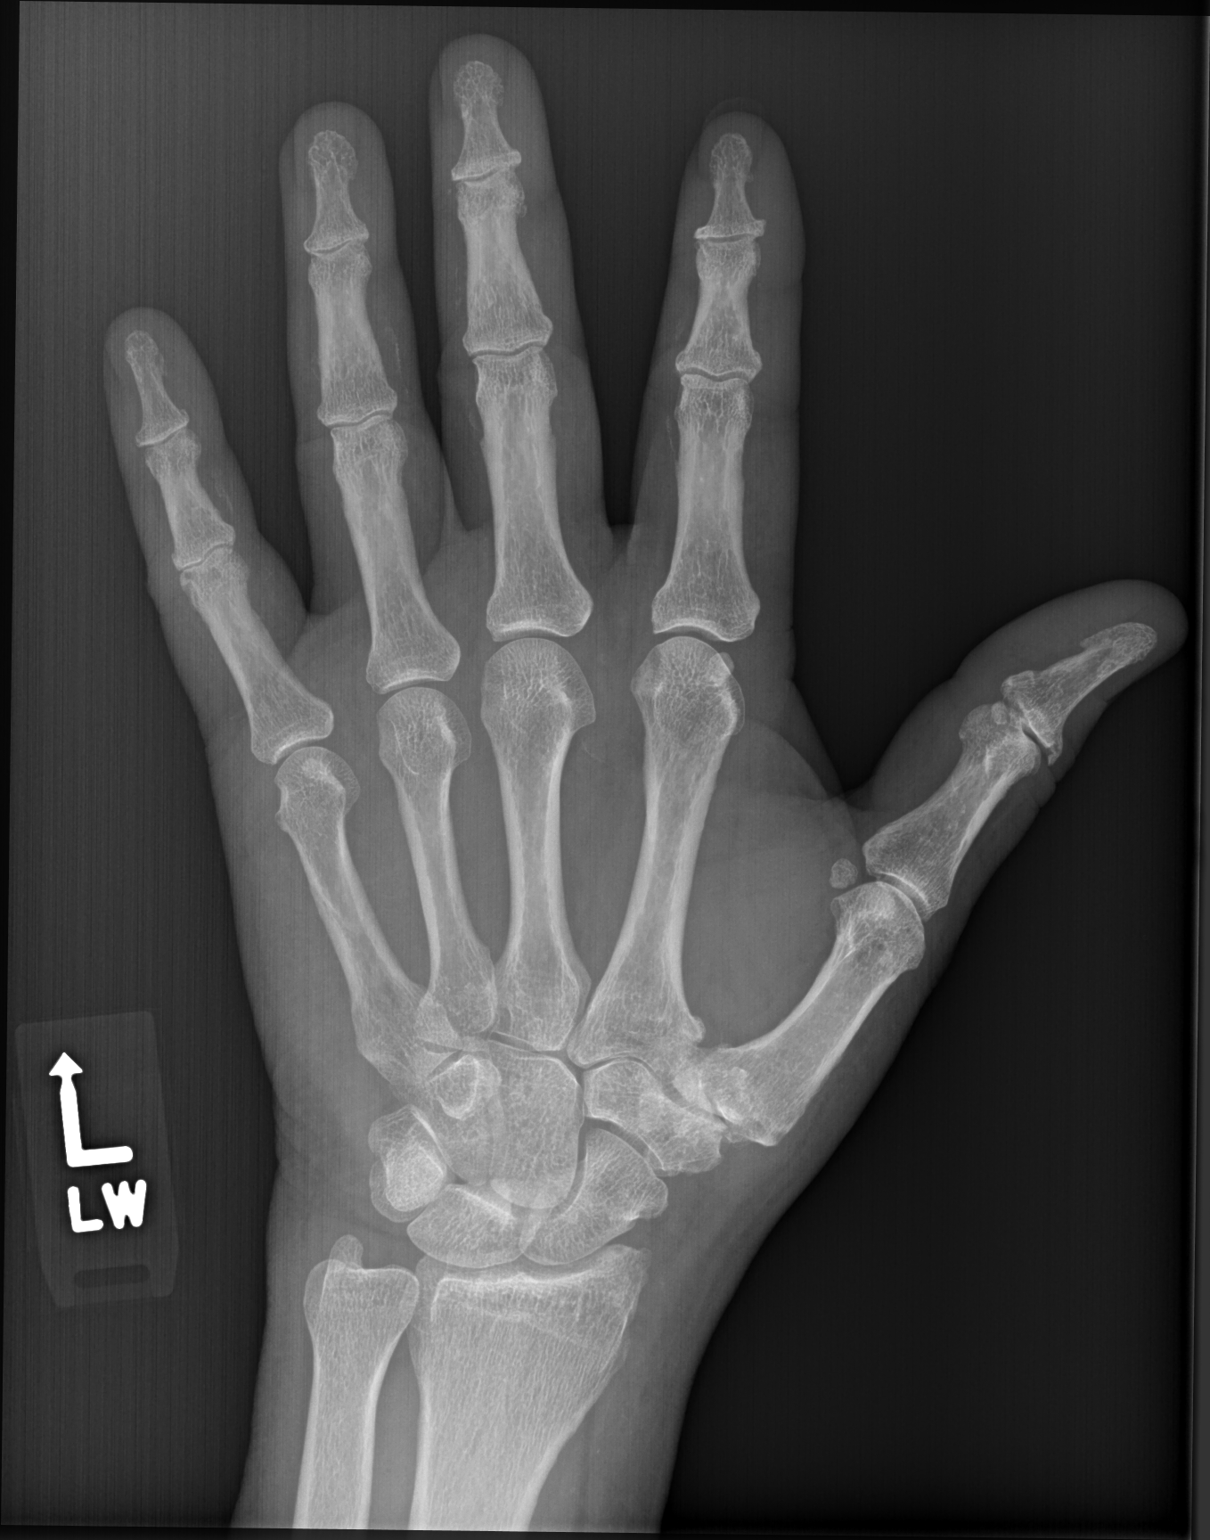

[hand obl (oblique)]
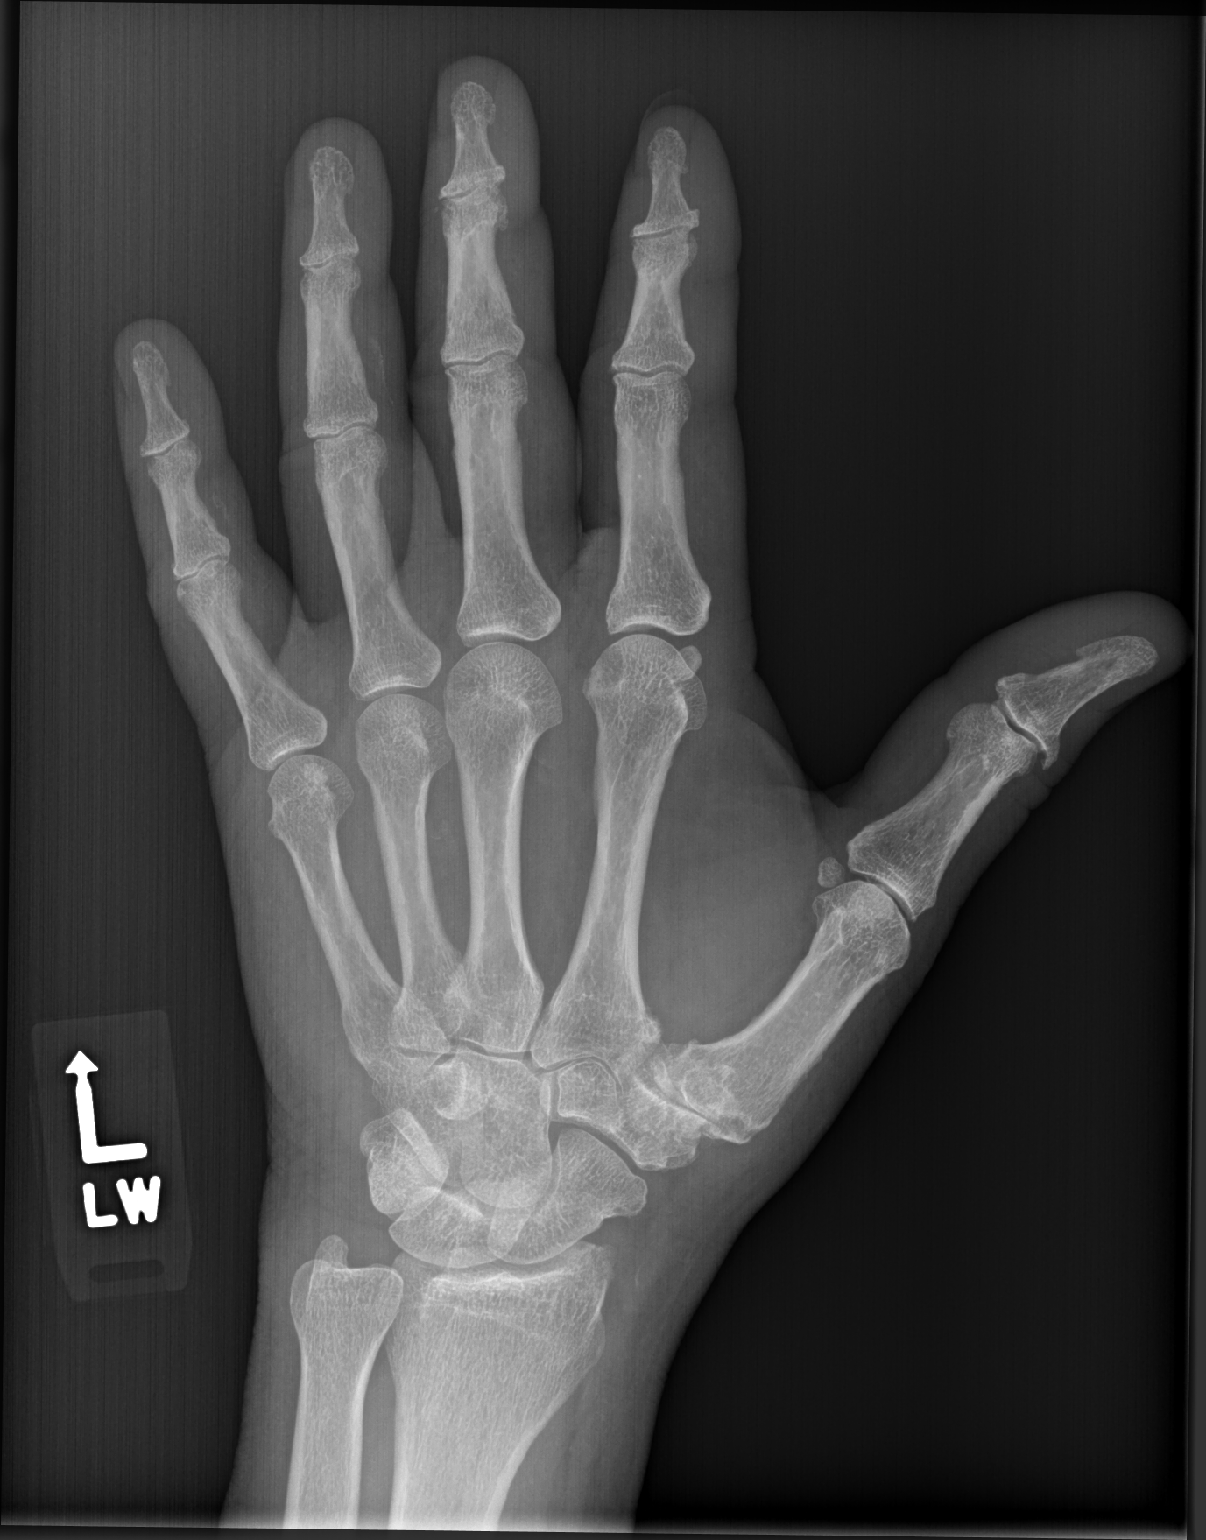

[hand lat]
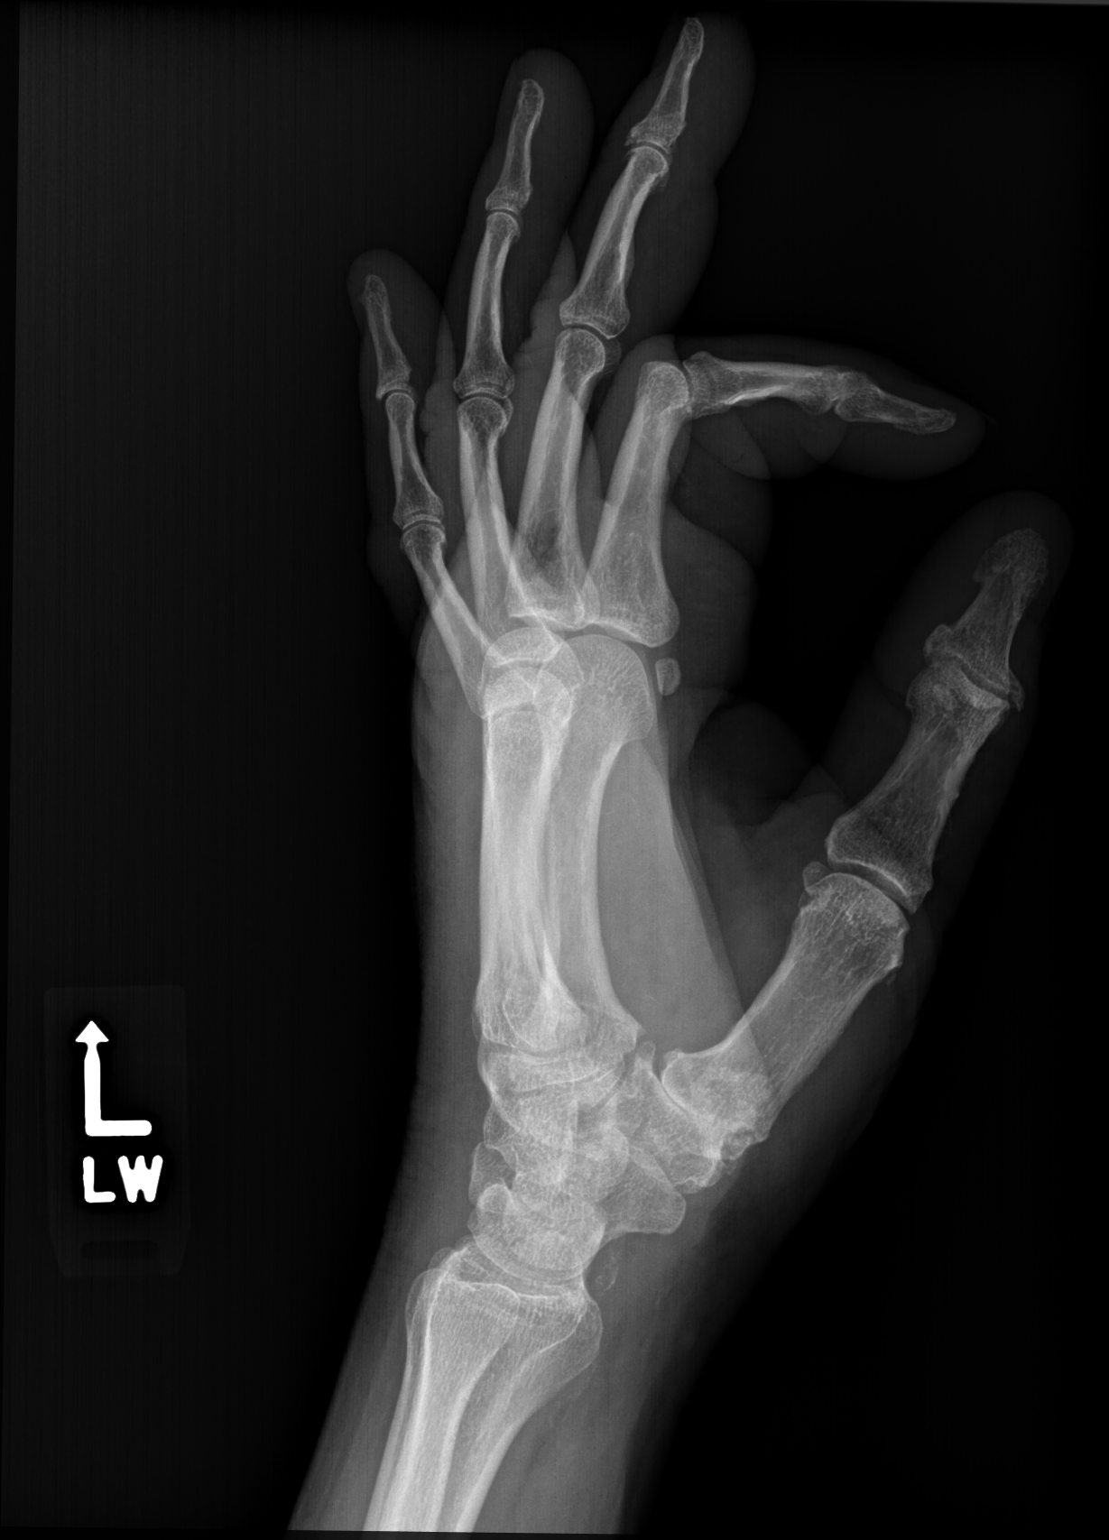

[3 of 3 positions shown; findings below may reference images not displayed]

FINDINGS: There is no evidence of fracture or dislocation. There slight
osteoarthritic changes of the IP joints of the fingers and thumb. Is
moderately severe arthritis at the first CMC joint.

No acute bone abnormality. Soft tissues are unremarkable.
IMPRESSION: No acute abnormality. Osteoarthritic changes as described.

## 2020-07-11 IMAGING — CT CT ABD-PELV W/O CM
2 of 4 series · 17 of 46 positions shown, 19 images · non-contrast
Comparison: None.

CLINICAL DATA: Mass and colon.

EXAM:
CT ABDOMEN AND PELVIS WITHOUT CONTRAST
TECHNIQUE: Multidetector CT imaging of the abdomen and pelvis was performed
following the standard protocol without IV contrast.

[Series 2: axial st · axial · 0.97mm/px · z∈[+1186,+1591]mm · 14 of 93 slices shown, 16 images]
[im 6/93  soft-tissue]
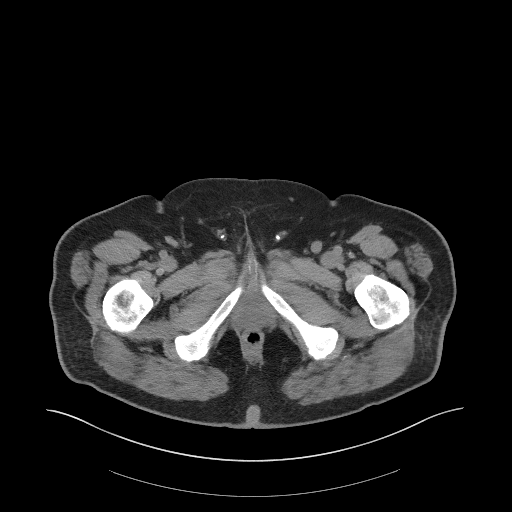
[im 6/93  bone]
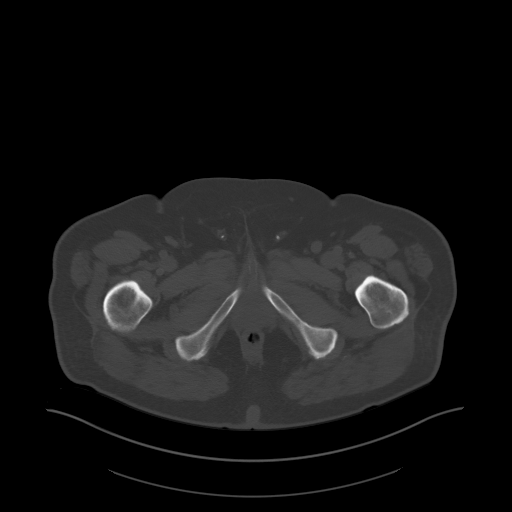
[im 11/93  soft-tissue]
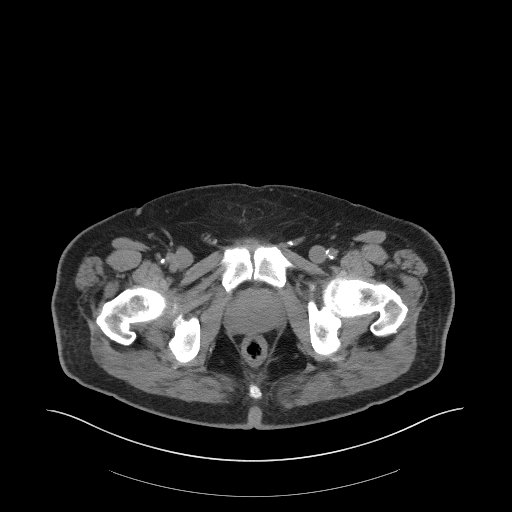
[im 17/93  soft-tissue]
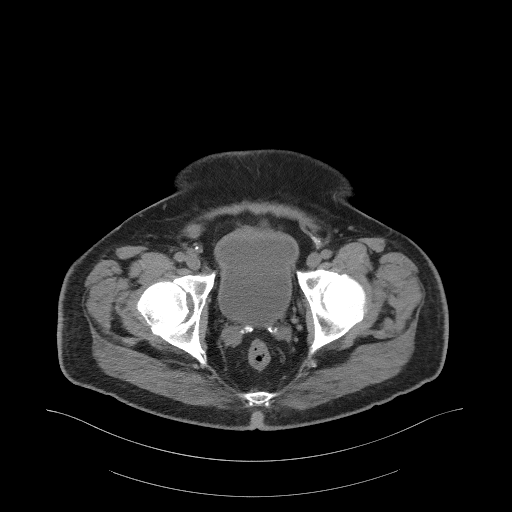
[im 28/93  soft-tissue]
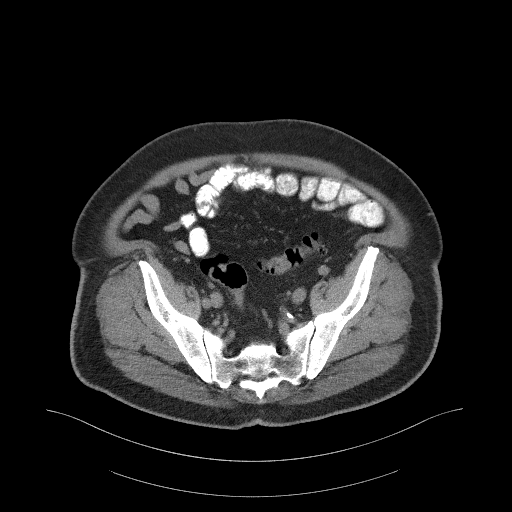
[im 33/93  soft-tissue]
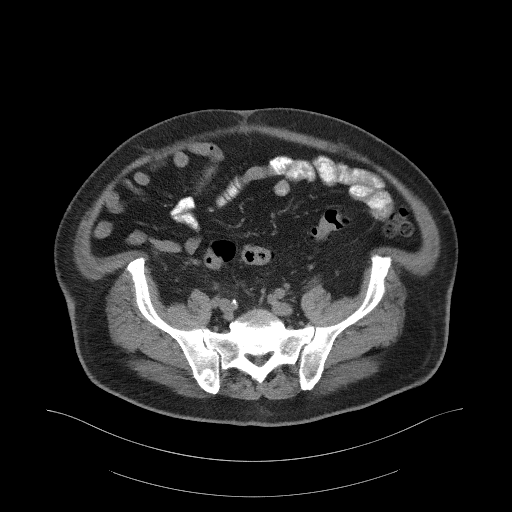
[im 38/93  soft-tissue]
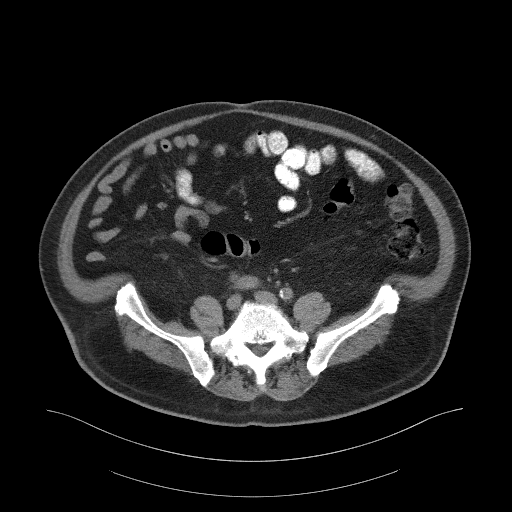
[im 44/93  soft-tissue]
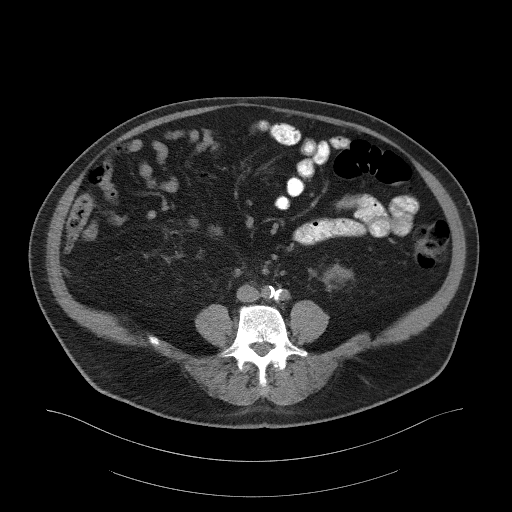
[im 49/93  soft-tissue]
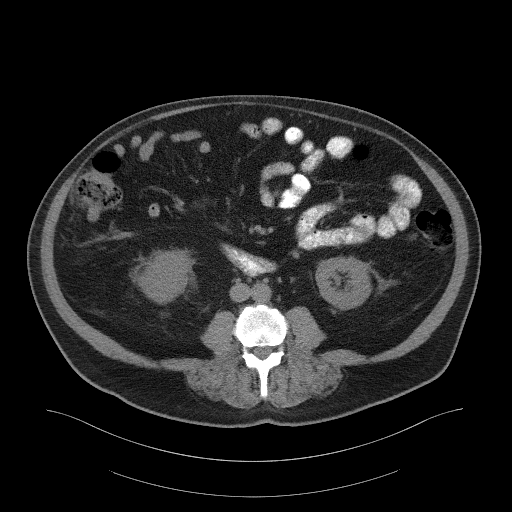
[im 55/93  soft-tissue]
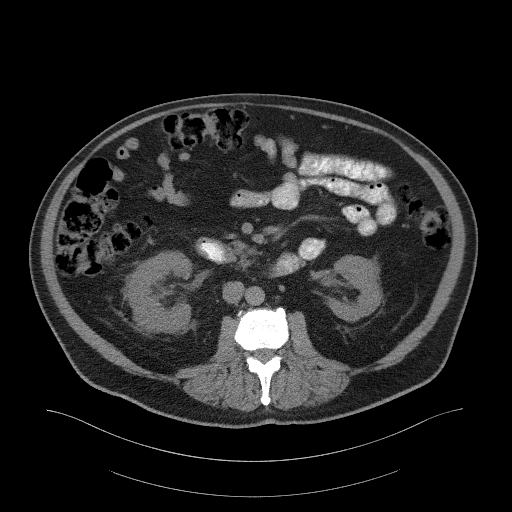
[im 55/93  bone]
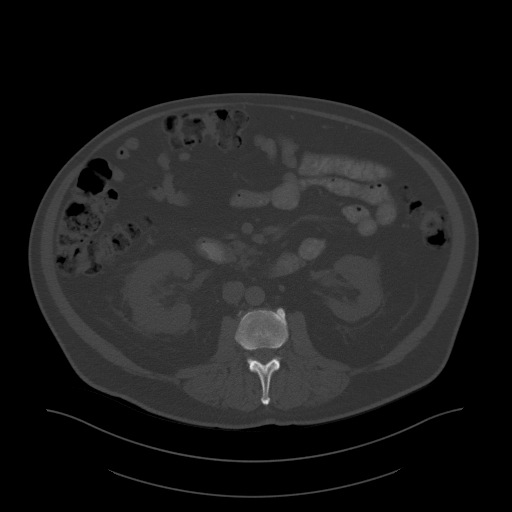
[im 60/93  soft-tissue]
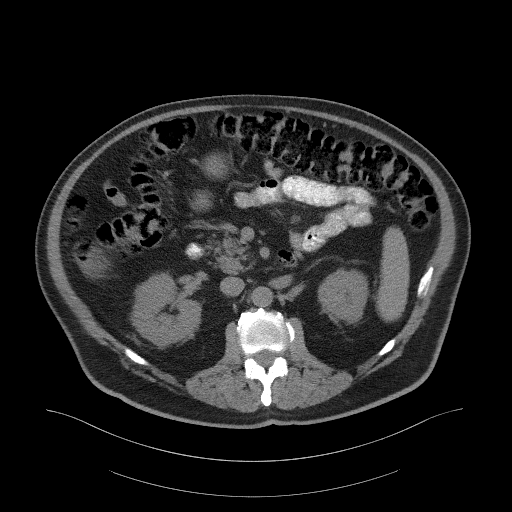
[im 71/93  soft-tissue]
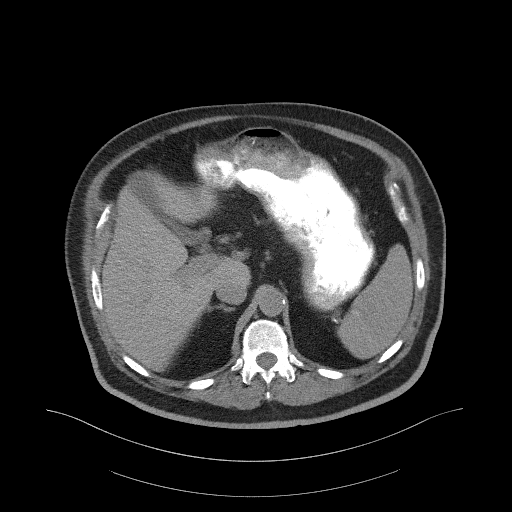
[im 76/93  soft-tissue]
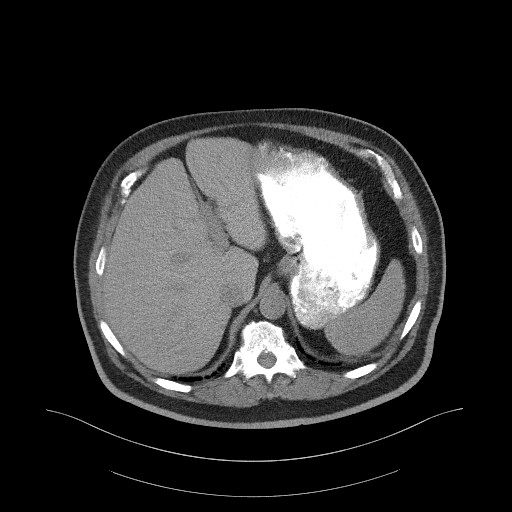
[im 82/93  soft-tissue]
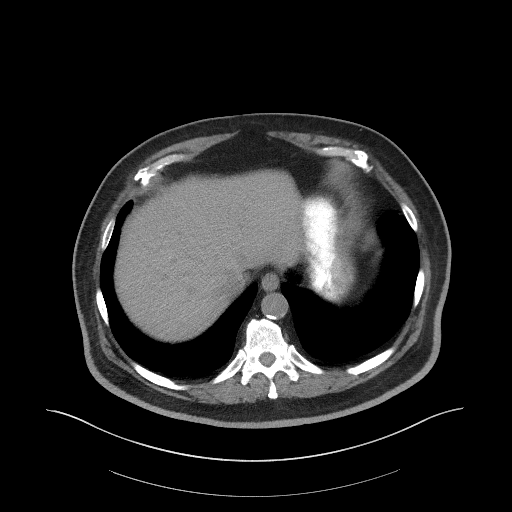
[im 87/93  soft-tissue]
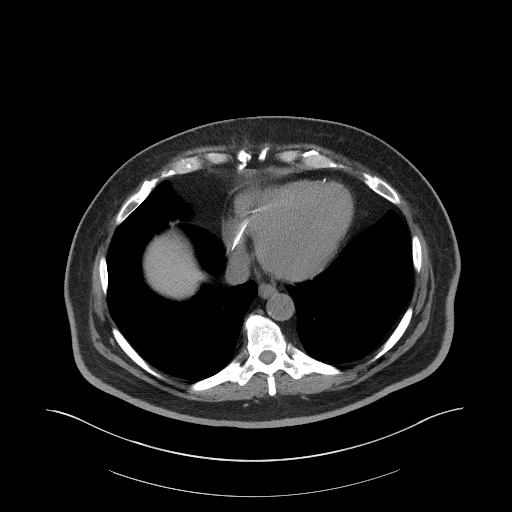

[Series 4: coronal st · coronal · 1.04mm/px · 3 of 100 slices shown]
[im 34/100  soft-tissue]
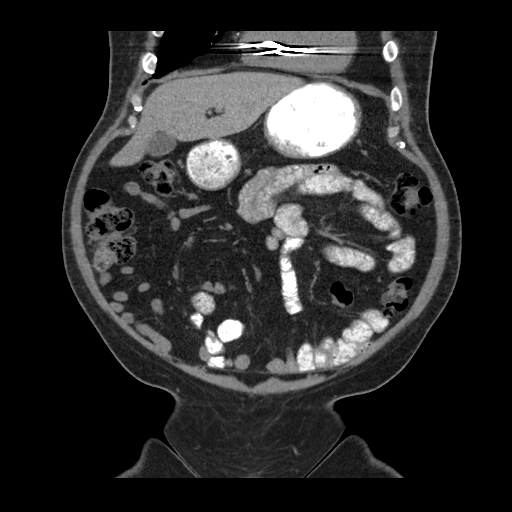
[im 45/100  soft-tissue]
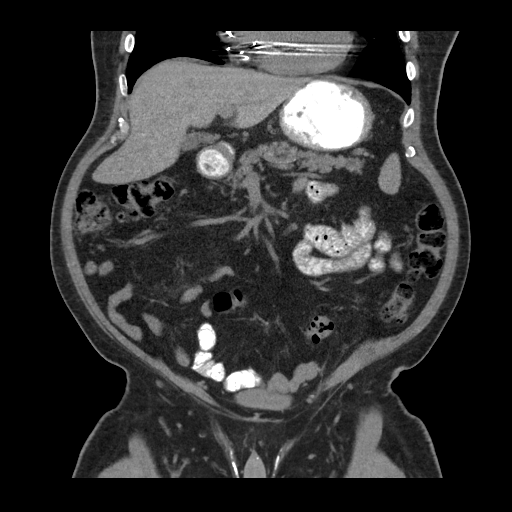
[im 56/100  soft-tissue]
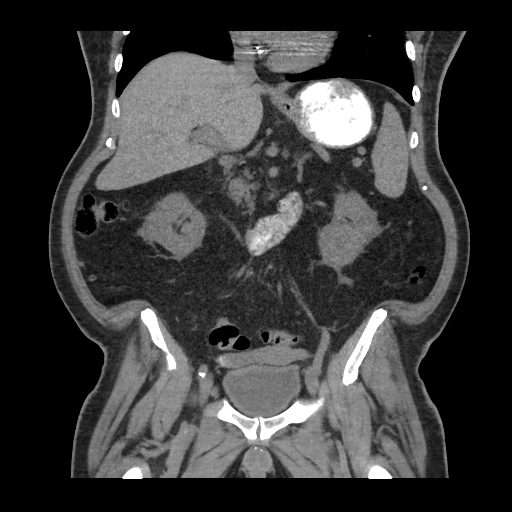

[17 of 46 positions shown; findings below may reference images not displayed]

FINDINGS: Lower chest: Pacer wires noted in the right heart. No acute
abnormality.

Hepatobiliary: No focal hepatic abnormality. Gallbladder
unremarkable.

Pancreas: No focal abnormality or ductal dilatation.

Spleen: No focal abnormality.  Normal size.

Adrenals/Urinary Tract: Bilateral perinephric stranding. No renal or
adrenal mass. No hydronephrosis. Urinary bladder unremarkable.

Stomach/Bowel: Previously seen colonic mass by colonoscopy not
appreciated by CT. Scattered colonic diverticula. No active
diverticulitis. Normal appendix. Stomach and small bowel
unremarkable. No evidence of bowel obstruction.

Vascular/Lymphatic: Aortic atherosclerosis. No enlarged abdominal or
pelvic lymph nodes.

Reproductive: Prostate enlargement.

Other: No free fluid or free air.

Musculoskeletal: There are small scattered sclerotic foci within the
right iliac bone, the largest posteriorly on image 61 measuring 13
mm.
IMPRESSION: No acute findings in the abdomen or pelvis.

Scattered colonic diverticulosis.  No diverticulitis.

Aortic atherosclerosis.

Mild prostate enlargement.

Scattered nonspecific sclerotic foci within the right iliac bone.
These could be further evaluated with nuclear medicine bone scan if
felt clinically indicated.

## 2020-07-17 ENCOUNTER — Telehealth: Payer: Self-pay

## 2020-07-17 NOTE — Telephone Encounter (Signed)
Patient calls today to report a quarter size knot in his TCAR incision. He denies any pain, redness, drainage, fever or chills. The knot is not expanding. Advised patient to keep an eye on the incision and call if any changes or further issues.

## 2020-08-05 ENCOUNTER — Ambulatory Visit (HOSPITAL_COMMUNITY)
Admission: RE | Admit: 2020-08-05 | Discharge: 2020-08-05 | Disposition: A | Discharge status: Home / Self Care | Payer: No Typology Code available for payment source | Source: Ambulatory Visit | Attending: Vascular Surgery | Admitting: Vascular Surgery

## 2020-08-05 ENCOUNTER — Ambulatory Visit (INDEPENDENT_AMBULATORY_CARE_PROVIDER_SITE_OTHER): Payer: No Typology Code available for payment source | Admitting: Vascular Surgery

## 2020-08-05 ENCOUNTER — Encounter: Payer: Self-pay | Admitting: Vascular Surgery

## 2020-08-05 ENCOUNTER — Other Ambulatory Visit: Payer: Self-pay

## 2020-08-05 VITALS — BP 172/94 | HR 72 | Temp 97.7°F | Resp 16 | Ht 67.0 in | Wt 217.0 lb

## 2020-08-05 DIAGNOSIS — I6522 Occlusion and stenosis of left carotid artery: Secondary | ICD-10-CM

## 2020-08-05 DIAGNOSIS — I6521 Occlusion and stenosis of right carotid artery: Secondary | ICD-10-CM

## 2020-08-05 DIAGNOSIS — I6523 Occlusion and stenosis of bilateral carotid arteries: Secondary | ICD-10-CM | POA: Diagnosis not present

## 2020-08-05 NOTE — Progress Notes (Signed)
Patient name: Martin Bush MRN: [id number] DOB: Jun 08, 1955 Sex: male  REASON FOR CONSULT: Post-op check after right TCAR  HPI: Martin Bush is a 65 y.o. male, with history of diabetes, coronary artery disease status post CABG as well as previous MI with ischemic cardiomyopathy and severe LV dysfunction, multiple previous PCI's, hypertension, hyperlipidemia that presents for postop check after most recent right TCAR.    He was initially seen with bilateral high-grade carotid artery stenosis that was asymptomatic and referred by Dr. Einar Gip.  His left TCAR was on 05/26/20.  Most recently had a right TCAR done on 07/07/2020.  He has had no complications.  Remains neurologically intact.  Remains on aspirin, Brilinta and statin.  No concerns today.  Past Medical History:  Diagnosis Date   AICD (automatic cardioverter/defibrillator) present    Medtronic   Arthritis    "thumbs"  (08/02/2017)   CHF (congestive heart failure) (HCC)    Chronic combined systolic and diastolic heart failure ([place])    a. 08/2017 Echo: EF 20-25%, mod glob HK. Sev distal ant sept, inflat HK. Apical AK. Gr2 DD. Mildly reduced RV fxn.   Colon polyps    Coronary artery disease    a. s/p CABG x 2 (LIMA->LAD, VG->OM); b. Multiple PCI's to LM/LCX/OM; c. 07/2017 PTCA of LM/LCX w/ early ISR-->repeat PCI/DES to LM (3.5x20 Synergy DES) and LCX (3.0x20 Synergy DES); d. 07/2018 Relook Cath: LM patent stent, LAD 100ost, LCX patent stent, OM1 99/60, LIMA->LAD ok. VG->dLCX 75 (old).   Depression    Encounter for assessment of implantable cardioverter-defibrillator (ICD) 09/27/2018   High cholesterol    History of hiatal hernia    Hypertension    ICD; Biventricular  Medtronic ICD Amplia MRI QWuad CRT-D  in situ 10/29/14 10/29/2014   Remote ICD check 09.23.20:  One 6 beat NSVT. No therapy.  1 SVT episode @ 130 bpm (38 Sec).  There were 23 Vent sense episodes detected for up to 1.1 min/day (AT). Health trends (patient activity, heart rate  variability, average heart rates) are stable.Trans-thoracic impedance trends and the OptiVol Fluid Index do no present significant abnormalities. Battery longevity is 4.3 years. RA pacing is 3   Ischemic cardiomyopathy 09/27/2018   MI (myocardial infarction) ([place]) 2003   NSTEMI (non-ST elevated myocardial infarction) ([place]) 07/16/2014   OSA on CPAP    Oxygen deficiency    Peripheral vascular disease ([place])    Pneumonia 10/2014   Proteinuria    Sleep apnea    Type II diabetes mellitus ([place])    insulin dependent    Past Surgical History:  Procedure Laterality Date   BIOPSY  09/19/2018   Procedure: BIOPSY;  Surgeon: Thornton Park, MD;  Location: [facility];  Service: Gastroenterology;;   CARDIAC CATHETERIZATION N/A 07/16/2014   Procedure: Left Heart Cath and Coronary Angiography;  Surgeon: Adrian Prows, MD;  Location: [place] [facility];  Service: Cardiovascular;  Laterality: N/A;   CARDIAC CATHETERIZATION  07/16/2014   Procedure: Coronary Balloon Angioplasty;  Surgeon: Adrian Prows, MD;  Location: [place] [facility];  Service: Cardiovascular;;   CARDIAC CATHETERIZATION  2003   CARDIAC DEFIBRILLATOR PLACEMENT  2016   CATARACT EXTRACTION Left 2018   CATARACT EXTRACTION W/ INTRAOCULAR LENS IMPLANT Left 03/2014   COLONOSCOPY     COLONOSCOPY WITH PROPOFOL N/A 09/19/2018   Procedure: COLONOSCOPY WITH PROPOFOL;  Surgeon: Thornton Park, MD;  [facility]: [facility];  Service: Gastroenterology;  Laterality: N/A;   CORONARY ANGIOPLASTY WITH STENT PLACEMENT     "  I've got a total of 8 stents in there; mostly doine at [facility]" (08/02/2017)   CORONARY ARTERY BYPASS GRAFT  ~ 2003   "CABG X2"; [facility]   CORONARY ATHERECTOMY N/A 08/16/2017   Procedure: CORONARY ATHERECTOMY;  Surgeon: Nigel Mormon, MD;  Location: [place] [facility];  Service: Cardiovascular;  Laterality: N/A;   CORONARY BALLOON ANGIOPLASTY N/A 08/04/2017   Procedure: CORONARY BALLOON ANGIOPLASTY;  Surgeon: Adrian Prows, MD;   Location: [place] [facility];  Service: Cardiovascular;  Laterality: N/A;   CORONARY BALLOON ANGIOPLASTY N/A 01/11/2019   Procedure: CORONARY BALLOON ANGIOPLASTY;  Surgeon: Adrian Prows, MD;  Location: [place] [facility];  Service: Cardiovascular;  Laterality: N/A;   CORONARY BALLOON ANGIOPLASTY N/A 10/09/2019   Procedure: CORONARY BALLOON ANGIOPLASTY;  Surgeon: Adrian Prows, MD;  Location: [place] [facility];  Service: Cardiovascular;  Laterality: N/A;   CORONARY STENT INTERVENTION N/A 08/16/2017   Procedure: CORONARY STENT INTERVENTION;  Surgeon: Nigel Mormon, MD;  Location: [place] [facility];  Service: Cardiovascular;  Laterality: N/A;   CORONARY STENT INTERVENTION N/A 10/09/2019   Procedure: CORONARY STENT INTERVENTION;  Surgeon: Adrian Prows, MD;  Location: [place] [facility];  Service: Cardiovascular;  Laterality: N/A;   ELBOW SURGERY Left ?2001   "pinched nerve"   ENDOSCOPIC MUCOSAL RESECTION  12/18/2018   Procedure: ENDOSCOPIC MUCOSAL RESECTION;  Surgeon: Rush Landmark Telford Nab., MD;  Location: [place];  Service: Gastroenterology;;   EYE SURGERY Left    cataract removal   FLEXIBLE SIGMOIDOSCOPY N/A 12/18/2018   Procedure: FLEXIBLE SIGMOIDOSCOPY;  Surgeon: Irving Copas., MD;  Location: [place];  Service: Gastroenterology;  Laterality: N/A;   HEMOSTASIS CLIP PLACEMENT  12/18/2018   Procedure: HEMOSTASIS CLIP PLACEMENT;  Surgeon: Irving Copas., MD;  Location: [place];  Service: Gastroenterology;;   LEFT HEART CATH AND CORONARY ANGIOGRAPHY N/A 01/11/2019   Procedure: LEFT HEART CATH AND CORONARY ANGIOGRAPHY;  Surgeon: Adrian Prows, MD;  Location: [place] [facility];  Service: Cardiovascular;  Laterality: N/A;   LEFT HEART CATH AND CORS/GRAFTS ANGIOGRAPHY N/A 08/04/2017   Procedure: LEFT HEART CATH AND CORS/GRAFTS ANGIOGRAPHY;  Surgeon: Adrian Prows, MD;  Location: [place] [facility];  Service: Cardiovascular;  Laterality: N/A;   LEFT HEART CATH AND CORS/GRAFTS  ANGIOGRAPHY N/A 08/20/2017   Procedure: LEFT HEART CATH AND CORS/GRAFTS ANGIOGRAPHY;  Surgeon: Nigel Mormon, MD;  Location: [place] [facility];  Service: Cardiovascular;  Laterality: N/A;   LEFT HEART CATH AND CORS/GRAFTS ANGIOGRAPHY N/A 01/08/2019   Procedure: LEFT HEART CATH AND CORS/GRAFTS ANGIOGRAPHY;  Surgeon: [place] Hampshire, MD;  Location: [place] [facility];  Service: Cardiovascular;  Laterality: N/A;   LEFT HEART CATH AND CORS/GRAFTS ANGIOGRAPHY N/A 10/09/2019   Procedure: LEFT HEART CATH AND CORS/GRAFTS ANGIOGRAPHY;  Surgeon: Adrian Prows, MD;  Location: [place] [facility];  Service: Cardiovascular;  Laterality: N/A;   LEFT HEART CATH AND CORS/GRAFTS ANGIOGRAPHY N/A 12/11/2019   Procedure: LEFT HEART CATH AND CORS/GRAFTS ANGIOGRAPHY;  Surgeon: Nigel Mormon, MD;  Location: [place] [facility];  Service: Cardiovascular;  Laterality: N/A;   POLYPECTOMY  09/19/2018   Procedure: POLYPECTOMY;  Surgeon: Thornton Park, MD;  [facility];  Service: Gastroenterology;;   [place]ia [place] INJECTION  09/19/2018   Procedure: SUBMUCOSAL INJECTION;  Surgeon: Thornton Park, MD;  [facility];  Service: Gastroenterology;;   SUBMUCOSAL LIFTING INJECTION  12/18/2018   Procedure: SUBMUCOSAL LIFTING INJECTION;  Surgeon: Irving Copas., MD;  Location: [place];  Service: Gastroenterology;;  TRANSCAROTID ARTERY REVASCULARIZATION  Left 05/26/2020   Procedure: LEFT TRANSCAROTID ARTERY REVASCULARIZATION;  Surgeon: [name], MD;  Location: [place];  Service: Vascular;  Laterality: Left;   TRANSCAROTID ARTERY REVASCULARIZATION  Right 07/07/2020   Procedure: RIGHT TRANSCAROTID ARTERY REVASCULARIZATION;  Surgeon: [name], MD;  Location: [facility];  Service: Vascular;  Laterality: Right;   ULTRASOUND GUIDANCE FOR VASCULAR ACCESS Right 05/26/2020   Procedure: ULTRASOUND GUIDANCE FOR VASCULAR ACCESS;  Surgeon: [name], MD;  Location: [facility] OR;   Service: Vascular;  Laterality: Right;   ULTRASOUND GUIDANCE FOR VASCULAR ACCESS Left 07/07/2020   Procedure: ULTRASOUND GUIDANCE FOR VASCULAR ACCESS;  Surgeon: [name], MD;  Location: [place];  Service: Vascular;  Laterality: Left;    Family History  Problem Relation Age of Onset   Uterine cancer Mother    Lung cancer Mother    Hyperlipidemia Father    Heart disease Father    Hypertension Father    Diabetes Father    Colon cancer Neg Hx     SOCIAL HISTORY: Social History   Socioeconomic History   Marital status: Married    Spouse name: Not on file   Number of children: 3   Years of education: Not on file   Highest education level: Not on file  Occupational History   Not on file  Tobacco Use   Smoking status: Former    Packs/day: 0.25    Years: 27.00    Pack years: 6.75    Types: Cigars, Cigarettes    Quit date: 2002    Years since quitting: 20.5   Smokeless tobacco: Former    Types: Chew    Quit date: 2002  Vaping Use   Vaping Use: Never used  Substance and Sexual Activity   Alcohol use: Not Currently   Drug use: Never   Sexual activity: Not Currently  Other Topics Concern   Not on file  Social History Narrative   Not on file   Social Determinants of Health   Financial Resource Strain: Low Risk    Difficulty of Paying Living Expenses: Not hard at all  Food Insecurity: No Food Insecurity   Worried About Charity fundraiser in the Last Year: Never true   Arboriculturist in the Last Year: Never true  Transportation Needs: No Transportation Needs   Lack of Transportation (Medical): No   Lack of Transportation (Non-Medical): No  Physical Activity: Not on file  Stress: No Stress Concern Present   Feeling of Stress : Not at all  Social Connections: Not on file  Intimate Partner Violence: Not At Risk   Fear of Current or Ex-Partner: No   Emotionally Abused: No   Physically Abused: No   Sexually Abused: No    Allergies  Allergen Reactions    Diltiazem Rash    Current Outpatient Medications  Medication Sig Dispense Refill   amiodarone (PACERONE) 200 MG tablet Take 1 tablet (200 mg total) by mouth daily.     aspirin EC 81 MG EC tablet Take 1 tablet (81 mg total) by mouth daily.     carvedilol (COREG) 12.5 MG tablet Take 12.5 mg by mouth 2 (two) times daily with a meal.     Cholecalciferol (VITAMIN D3) 50 MCG (2000 UT) TABS Take 2,000 Units by mouth 2 (two) times daily.      Cyanocobalamin 2500 MCG CHEW Chew 2,500 mcg by mouth daily.      HYDROcodone-acetaminophen (NORCO/VICODIN) 5-325 MG tablet Take  1 tablet by mouth every 4 (four) hours as needed for moderate pain. 20 tablet 0   insulin isophane & regular human (NOVOLIN 70/30 FLEXPEN) (70-30) 100 UNIT/ML KwikPen Inject 30-63 Units into the skin 2 (two) times daily as needed (high blood sugar).     metFORMIN (GLUCOPHAGE-XR) 500 MG 24 hr tablet Take 500 mg by mouth 2 (two) times daily.     metFORMIN (GLUCOPHAGE-XR) 500 MG 24 hr tablet Take by mouth.     nitroGLYCERIN (NITROSTAT) 0.4 MG SL tablet Place 1 tablet (0.4 mg total) under the tongue every 5 (five) minutes x 3 doses as needed for chest pain. 25 tablet 3   Omega-3 Fatty Acids (FISH OIL) 1000 MG CAPS Take 2,000 mg by mouth 3 (three) times daily.     OXYGEN Inhale 2 L/min into the lungs See admin instructions. 2 L/min at bedtime in conjunction with CPAP     potassium chloride SA (KLOR-CON) 20 MEQ tablet Take 1 tablet (20 mEq total) by mouth 2 (two) times daily. 180 tablet 3   PRESCRIPTION MEDICATION Inhale into the lungs See admin instructions. CPAP- At bedtime     rosuvastatin (CRESTOR) 40 MG tablet Take 1 tablet (40 mg total) by mouth at bedtime.     sacubitril-valsartan (ENTRESTO) 97-103 MG Take 1 tablet by mouth 2 (two) times daily. 60 tablet    Semaglutide, 1 MG/DOSE, 2 MG/1.5ML SOPN Inject 1 mg into the skin every Sunday.     sertraline (ZOLOFT) 100 MG tablet Take 50 mg by mouth daily.     ticagrelor (BRILINTA) 90 MG TABS  tablet Take 1 tablet (90 mg total) by mouth 2 (two) times daily. 60 tablet    torsemide (DEMADEX) 20 MG tablet Take 1 tablet (20 mg total) by mouth daily as needed (for an overnight weight gain of 3 pounds or more). 90 tablet 3   No current facility-administered medications for this visit.    REVIEW OF SYSTEMS:  [X]  denotes positive finding, [ ]  denotes negative finding Cardiac  Comments:  Chest pain or chest pressure:    Shortness of breath upon exertion:    Short of breath when lying flat:    Irregular heart rhythm:        Vascular    Pain in calf, thigh, or hip brought on by ambulation:    Pain in feet at night that wakes you up from your sleep:     Blood clot in your veins:    Leg swelling:         Pulmonary    Oxygen at home:    Productive cough:     Wheezing:         Neurologic    Sudden weakness in arms or legs:     Sudden numbness in arms or legs:     Sudden onset of difficulty speaking or slurred speech:    Temporary loss of vision in one eye:     Problems with dizziness:         Gastrointestinal    Blood in stool:     Vomited blood:         Genitourinary    Burning when urinating:     Blood in urine:        Psychiatric    Major depression:         Hematologic    Bleeding problems:    Problems with blood clotting too easily:        Skin  Rashes or ulcers:        Constitutional    Fever or chills:      PHYSICAL EXAM: Vitals:   08/05/20 0826 08/05/20 0832  BP: (!) 169/97 (!) 172/94  Pulse: 72 72  Resp: 16   Temp: 97.7 F (36.5 C)   TempSrc: Temporal   SpO2: 97%   Weight: 217 lb (98.4 kg)   Height: 5\' 7"  (1.702 m)     GENERAL: The patient is a well-nourished male, in no acute distress. The vital signs are documented above. CARDIAC: There is a regular rate and rhythm.  VASCULAR:  Left neck incision well-healed Right neck incision clean dry and intact NEUROLOGIC: No focal weakness or paresthesias are detected.  Cranial nerves II through  XII grossly intact   DATA:   Bilateral carotid duplex today shows widely patent bilateral carotid stents.  Assessment/Plan:  65 year old male presents for postop check after most recent right TCAR on 07/07/2020.  Patient has now undergone bilateral carotid stenting with TCAR for bilateral asymptomatic high-grade stenosis.  He is doing quite well and is neurologically intact.  His carotid duplex today shows both stents are widely patent.  I discussed I will see him again in 9 months for continued surveillance.  Discussed that he stay on aspirin and statin from my standpoint and he is already on Brilinta from cardiology.   [name], MD [facility] Office: [phone]

## 2020-08-06 DIAGNOSIS — I5043 Acute on chronic combined systolic (congestive) and diastolic (congestive) heart failure: Secondary | ICD-10-CM | POA: Diagnosis not present

## 2020-08-07 ENCOUNTER — Other Ambulatory Visit: Payer: Self-pay

## 2020-08-07 DIAGNOSIS — I6522 Occlusion and stenosis of left carotid artery: Secondary | ICD-10-CM

## 2020-08-11 ENCOUNTER — Other Ambulatory Visit: Payer: Self-pay

## 2020-08-11 ENCOUNTER — Encounter: Payer: Self-pay | Admitting: Cardiology

## 2020-08-11 ENCOUNTER — Ambulatory Visit: Payer: No Typology Code available for payment source | Admitting: Cardiology

## 2020-08-11 VITALS — BP 144/75 | HR 67 | Temp 97.9°F | Resp 17 | Ht 67.0 in | Wt 220.4 lb

## 2020-08-11 DIAGNOSIS — I6523 Occlusion and stenosis of bilateral carotid arteries: Secondary | ICD-10-CM

## 2020-08-11 DIAGNOSIS — I5042 Chronic combined systolic (congestive) and diastolic (congestive) heart failure: Secondary | ICD-10-CM

## 2020-08-11 DIAGNOSIS — I255 Ischemic cardiomyopathy: Secondary | ICD-10-CM

## 2020-08-11 DIAGNOSIS — IMO0002 Reserved for concepts with insufficient information to code with codable children: Secondary | ICD-10-CM

## 2020-08-11 DIAGNOSIS — I10 Essential (primary) hypertension: Secondary | ICD-10-CM

## 2020-08-11 DIAGNOSIS — N183 Chronic kidney disease, stage 3 unspecified: Secondary | ICD-10-CM

## 2020-08-11 DIAGNOSIS — I2510 Atherosclerotic heart disease of native coronary artery without angina pectoris: Secondary | ICD-10-CM

## 2020-08-11 MED ORDER — EMPAGLIFLOZIN 25 MG PO TABS: 25.0000 mg | ORAL_TABLET | Freq: Every day | ORAL | 3 refills | Status: DC

## 2020-08-11 MED ORDER — AMIODARONE HCL 200 MG PO TABS: 100.0000 mg | ORAL_TABLET | Freq: Every day | ORAL | Status: DC

## 2020-08-11 NOTE — Progress Notes (Signed)
Primary Physician/Referring:  [name], MD  Patient ID: Martin Bush, male    DOB: [birth year], 65 y.o.   MRN: [id number]  Chief Complaint  Patient presents with   Follow-up    3 MONTH   Coronary Artery Disease   Congestive Heart Failure   CAROTID STENOSIS    HPI:    Martin Bush  is a 65 y.o. Caucasian male with CAD and history of CABG x2 in 2002  with only LIMA to LAD being patent, Circumflex coronary artery is super dominant. History of stenting to circumflex/OM bifurcation due to recurrent restenosis in 2015, left main and circumflex proximal and midsegment in 2016, angioplasty to left main and proximal and mid circumflex in 2020 and again 10/08/2019. He presented with acute pulmonary edema again on 12/11/2019 and repeat cardiac catheterization essentially revealed widely patent circumflex stent.  Past medical history significant for ischemic cardiomyopathy with severe LV systolic dysfunction, recurrent sustained VT SP ICD implantation, hypertension, mixed hyperlipidemia, uncontrolled diabetes mellitus with stage III chronic kidney disease.  He was evaluated at [facility] on 03/27/2020, Jardiance and Victoza was discontinued and patient was started on semaglutide (Ozempic).  When I saw him in the office 6 weeks ago, he developed significant dizziness, low blood pressure and hence I made cardiac medication changes which I suspect was related to [place].  He has maintained weight loss, he has not had any dyspnea denies any chest pain or palpitations or leg edema.  Accompanied by his wife.    Since his last office visit, the patient had a left TCAR on 05/26/2020 and a right TCAR on 07/07/2020. Vascular surgery is following up with him in 9 months for continued surveillance of his carotic artery stents which remained patent on 08/05/2020.   Patient presents today for follow up of CAD, CHF, and carotid stenosis. NO new symptoms and c/o back pain due to accidental fall. No  change in chronic dyspnea. NO leg edema or chest pain.   Past Medical History:  Diagnosis Date   AICD (automatic cardioverter/defibrillator) present    Medtronic   Arthritis    "thumbs"  (08/02/2017)   CHF (congestive heart failure) (HCC)    Chronic combined systolic and diastolic heart failure ([place])    a. 08/2017 Echo: EF 20-25%, mod glob HK. Sev distal ant sept, inflat HK. Apical AK. Gr2 DD. Mildly reduced RV fxn.   Colon polyps    Coronary artery disease    a. s/p CABG x 2 (LIMA->LAD, VG->OM); b. Multiple PCI's to LM/LCX/OM; c. 07/2017 PTCA of LM/LCX w/ early ISR-->repeat PCI/DES to LM (3.5x20 Synergy DES) and LCX (3.0x20 Synergy DES); d. 07/2018 Relook Cath: LM patent stent, LAD 100ost, LCX patent stent, OM1 99/60, LIMA->LAD ok. VG->dLCX 45 (old).   Depression    Encounter for assessment of implantable cardioverter-defibrillator (ICD) 09/27/2018   High cholesterol    History of hiatal hernia    Hypertension    ICD; Biventricular  Medtronic ICD Amplia MRI QWuad CRT-D  in situ 10/29/14 10/29/2014   Remote ICD check 09.23.20:  One 6 beat NSVT. No therapy.  1 SVT episode @ 130 bpm (38 Sec).  There were 23 Vent sense episodes detected for up to 1.1 min/day (AT). Health trends (patient activity, heart rate variability, average heart rates) are stable.Trans-thoracic impedance trends and the OptiVol Fluid Index do no present significant abnormalities. Battery longevity is 4.3 years. RA pacing is 3   Ischemic cardiomyopathy 09/27/2018   MI (myocardial infarction) ([place])  2003   NSTEMI (non-ST elevated myocardial infarction) ([place]) 07/16/2014   OSA on CPAP    Oxygen deficiency    Peripheral vascular disease ([place])    Pneumonia 10/2014   Proteinuria    Sleep apnea    Type II diabetes mellitus ([place])    insulin dependent   Past Surgical History:  Procedure Laterality Date   BIOPSY  09/19/2018   Procedure: BIOPSY;  Surgeon: Thornton Park, MD;  [facility]: [facility];  Service: Gastroenterology;;    CARDIAC CATHETERIZATION N/A 07/16/2014   Procedure: Left Heart Cath and Coronary Angiography;  Surgeon: Adrian Prows, MD;  Location: [place] [facility];  Service: Cardiovascular;  Laterality: N/A;   CARDIAC CATHETERIZATION  07/16/2014   Procedure: Coronary Balloon Angioplasty;  Surgeon: Adrian Prows, MD;  Location: [place] [facility];  Service: Cardiovascular;;   CARDIAC CATHETERIZATION  2003   CARDIAC DEFIBRILLATOR PLACEMENT  2016   CATARACT EXTRACTION Left 2018   CATARACT EXTRACTION W/ INTRAOCULAR LENS IMPLANT Left 03/2014   COLONOSCOPY     COLONOSCOPY WITH PROPOFOL N/A 09/19/2018   Procedure: COLONOSCOPY WITH PROPOFOL;  Surgeon: Thornton Park, MD;  [facility];  Service: Gastroenterology;  Laterality: N/A;   CORONARY ANGIOPLASTY WITH STENT PLACEMENT     "I've got a total of 8 stents in there; mostly doine at [facility]" (08/02/2017)   CORONARY ARTERY BYPASS GRAFT  ~ 2003   "CABG X2"; [facility]   CORONARY ATHERECTOMY N/A 08/16/2017   Procedure: CORONARY ATHERECTOMY;  Surgeon: Nigel Mormon, MD;  Location: [place] [facility];  Service: Cardiovascular;  Laterality: N/A;   CORONARY BALLOON ANGIOPLASTY N/A 08/04/2017   Procedure: CORONARY BALLOON ANGIOPLASTY;  Surgeon: Adrian Prows, MD;  Location: [place] [facility];  Service: Cardiovascular;  Laterality: N/A;   CORONARY BALLOON ANGIOPLASTY N/A 01/11/2019   Procedure: CORONARY BALLOON ANGIOPLASTY;  Surgeon: Adrian Prows, MD;  Location: [place] [facility];  Service: Cardiovascular;  Laterality: N/A;   CORONARY BALLOON ANGIOPLASTY N/A 10/09/2019   Procedure: CORONARY BALLOON ANGIOPLASTY;  Surgeon: Adrian Prows, MD;  Location: [place] [facility];  Service: Cardiovascular;  Laterality: N/A;   CORONARY STENT INTERVENTION N/A 08/16/2017   Procedure: CORONARY STENT INTERVENTION;  Surgeon: Nigel Mormon, MD;  Location: [place] [facility];  Service: Cardiovascular;  Laterality: N/A;   CORONARY STENT INTERVENTION N/A 10/09/2019    Procedure: CORONARY STENT INTERVENTION;  Surgeon: Adrian Prows, MD;  Location: [place] [facility];  Service: Cardiovascular;  Laterality: N/A;   ELBOW SURGERY Left ?2001   "pinched nerve"   ENDOSCOPIC MUCOSAL RESECTION  12/18/2018   Procedure: ENDOSCOPIC MUCOSAL RESECTION;  Surgeon: Rush Landmark Telford Nab., MD;  Location: [place];  Service: Gastroenterology;;   EYE SURGERY Left    cataract removal   FLEXIBLE SIGMOIDOSCOPY N/A 12/18/2018   Procedure: FLEXIBLE SIGMOIDOSCOPY;  Surgeon: Irving Copas., MD;  Location: [place];  Service: Gastroenterology;  Laterality: N/A;   HEMOSTASIS CLIP PLACEMENT  12/18/2018   Procedure: HEMOSTASIS CLIP PLACEMENT;  Surgeon: Irving Copas., MD;  Location: [place];  Service: Gastroenterology;;   LEFT HEART CATH AND CORONARY ANGIOGRAPHY N/A 01/11/2019   Procedure: LEFT HEART CATH AND CORONARY ANGIOGRAPHY;  Surgeon: Adrian Prows, MD;  Location: [place] [facility];  Service: Cardiovascular;  Laterality: N/A;   LEFT HEART CATH AND CORS/GRAFTS ANGIOGRAPHY N/A 08/04/2017   Procedure: LEFT HEART CATH AND CORS/GRAFTS ANGIOGRAPHY;  Surgeon: Adrian Prows, MD;  Location: [place] [facility];  Service: Cardiovascular;  Laterality: N/A;   LEFT  HEART CATH AND CORS/GRAFTS ANGIOGRAPHY N/A 08/20/2017   Procedure: LEFT HEART CATH AND CORS/GRAFTS ANGIOGRAPHY;  Surgeon: Nigel Mormon, MD;  Location: [place] [facility];  Service: Cardiovascular;  Laterality: N/A;   LEFT HEART CATH AND CORS/GRAFTS ANGIOGRAPHY N/A 01/08/2019   Procedure: LEFT HEART CATH AND CORS/GRAFTS ANGIOGRAPHY;  Surgeon: [place] Hampshire, MD;  Location: [place] [facility];  Service: Cardiovascular;  Laterality: N/A;   LEFT HEART CATH AND CORS/GRAFTS ANGIOGRAPHY N/A 10/09/2019   Procedure: LEFT HEART CATH AND CORS/GRAFTS ANGIOGRAPHY;  Surgeon: Adrian Prows, MD;  Location: [place] [facility];  Service: Cardiovascular;  Laterality: N/A;   LEFT HEART CATH AND CORS/GRAFTS ANGIOGRAPHY N/A  12/11/2019   Procedure: LEFT HEART CATH AND CORS/GRAFTS ANGIOGRAPHY;  Surgeon: Nigel Mormon, MD;  Location: [place] [facility];  Service: Cardiovascular;  Laterality: N/A;   POLYPECTOMY  09/19/2018   Procedure: POLYPECTOMY;  Surgeon: Thornton Park, MD;  [facility];  Service: Gastroenterology;;   Lia [place]oyer INJECTION  09/19/2018   Procedure: SUBMUCOSAL INJECTION;  Surgeon: Thornton Park, MD;  [facility];  Service: Gastroenterology;;   Lia Foyer LIFTING INJECTION  12/18/2018   Procedure: SUBMUCOSAL LIFTING INJECTION;  Surgeon: Irving Copas., MD;  Location: [place];  Service: Gastroenterology;;   TRANSCAROTID ARTERY REVASCULARIZATION  Left 05/26/2020   Procedure: LEFT TRANSCAROTID ARTERY REVASCULARIZATION;  Surgeon: [name], MD;  Location: [place];  Service: Vascular;  Laterality: Left;   TRANSCAROTID ARTERY REVASCULARIZATION  Right 07/07/2020   Procedure: RIGHT TRANSCAROTID ARTERY REVASCULARIZATION;  Surgeon: [name], MD;  Location: [place];  Service: Vascular;  Laterality: Right;   ULTRASOUND GUIDANCE FOR VASCULAR ACCESS Right 05/26/2020   Procedure: ULTRASOUND GUIDANCE FOR VASCULAR ACCESS;  Surgeon: [name], MD;  Location: [facility] OR;  Service: Vascular;  Laterality: Right;   ULTRASOUND GUIDANCE FOR VASCULAR ACCESS Left 07/07/2020   Procedure: ULTRASOUND GUIDANCE FOR VASCULAR ACCESS;  Surgeon: [name], MD;  Location: [facility] OR;  Service: Vascular;  Laterality: Left;    Family History  Problem Relation Age of Onset   Uterine cancer Mother    Lung cancer Mother    Hyperlipidemia Father    Heart disease Father    Hypertension Father    Diabetes Father    Hypertension Sister    Hypertension Brother    Colon cancer Neg Hx    Social History   Tobacco Use   Smoking status: Former    Packs/day: 0.25    Years: 27.00    Pack years: 6.75    Types: Cigars, Cigarettes    Quit date: 2002    Years since  quitting: 20.5   Smokeless tobacco: Former    Types: Chew    Quit date: 2002  Substance Use Topics   Alcohol use: Not Currently    Marital Status: Married ROS  Review of Systems  Cardiovascular:  Positive for dyspnea on exertion (very mld). Negative for chest pain, claudication, leg swelling, orthopnea, palpitations, paroxysmal nocturnal dyspnea and syncope.  Respiratory:  Negative for shortness of breath.   Hematologic/Lymphatic: Does not bruise/bleed easily.  Gastrointestinal:  Negative for melena.  Neurological:  Negative for dizziness.  Objective  Blood pressure (!) 144/75, pulse 67, temperature 97.9 F (36.6 C), temperature source Temporal, resp. rate 17, height '5\' 7"'  (1.702 m), weight 220 lb 6.4 oz (100 kg), SpO2 98 %.  Vitals with BMI 08/11/2020 08/11/2020 08/05/2020  Height - '5\' 7"'  -  Weight - 220 lbs 6 oz -  BMI -  16.10 -  Systolic 960 454 098  Diastolic 75 90 94  Pulse 67 75 72     Physical Exam Vitals reviewed.  Constitutional:      Appearance: He is obese.     Comments: He is moderately built and mildly obese in no acute distress.  Cardiovascular:     Rate and Rhythm: Normal rate and regular rhythm.     Pulses:          Carotid pulses are 1+ on the right side with bruit and 1+ on the left side with bruit.      Femoral pulses are 1+ on the right side and 1+ on the left side.      Popliteal pulses are 1+ on the right side and 1+ on the left side.       Dorsalis pedis pulses are 0 on the right side and 1+ on the left side.       Posterior tibial pulses are 1+ on the right side and 0 on the left side.     Heart sounds: Normal heart sounds.     Comments: No JVD.  1+ pitting edema above ankle bilateral  Pulmonary:     Effort: Pulmonary effort is normal. No accessory muscle usage or respiratory distress.     Breath sounds: Normal breath sounds.  Neurological:     Mental Status: He is alert.   Laboratory examination:   Recent Labs    10/23/19 1347 11/14/19 1514  12/06/19 1431 12/09/19 0252 05/27/20 0500 07/02/20 1415 07/08/20 0146  NA 136 138 141   < > 130* 132* 128*  K 4.6 4.8 5.1   < > 4.6 4.9 4.4  CL 100 102 103   < > 104 104 100  CO2 '20 21 24   ' < > 17* 20* 21*  GLUCOSE 356* 362* 377*   < > 207* 266* 233*  BUN 28* 24 17   < > 26* 33* 24*  CREATININE 1.32* 1.43* 1.30*   < > 1.44* 1.79* 1.54*  CALCIUM 9.4 9.3 8.9   < > 8.7* 9.0 8.5*  GFRNONAA 57* 52* 58*   < > 54* 42* 50*  GFRAA 66 60 67  --   --   --   --    < > = values in this interval not displayed.   CrCl cannot be calculated (Patient's most recent lab result is older than the maximum 21 days allowed.).  CMP Latest Ref Rng & Units 07/08/2020 07/02/2020 05/27/2020  Glucose 70 - 99 mg/dL 233(H) 266(H) 207(H)  BUN 8 - 23 mg/dL 24(H) 33(H) 26(H)  Creatinine 0.61 - 1.24 mg/dL 1.54(H) 1.79(H) 1.44(H)  Sodium 135 - 145 mmol/L 128(L) 132(L) 130(L)  Potassium 3.5 - 5.1 mmol/L 4.4 4.9 4.6  Chloride 98 - 111 mmol/L 100 104 104  CO2 22 - 32 mmol/L 21(L) 20(L) 17(L)  Calcium 8.9 - 10.3 mg/dL 8.5(L) 9.0 8.7(L)  Total Protein 6.5 - 8.1 g/dL - 7.0 -  Total Bilirubin 0.3 - 1.2 mg/dL - 0.6 -  Alkaline Phos 38 - 126 U/L - 164(H) -  AST 15 - 41 U/L - 16 -  ALT 0 - 44 U/L - 16 -   CBC Latest Ref Rng & Units 07/08/2020 07/02/2020 05/27/2020  WBC 4.0 - 10.5 K/uL 12.6(H) 8.8 12.5(H)  Hemoglobin 13.0 - 17.0 g/dL 10.0(L) 12.2(L) 11.4(L)  Hematocrit 39.0 - 52.0 % 30.3(L) 38.1(L) 34.2(L)  Platelets 150 - 400 K/uL 180 193 194   Lipid  Panel Recent Labs    05/27/20 0500 07/08/20 0146  CHOL 117 101  TRIG 75 65  LDLCALC 60 50  VLDL 15 13  HDL 42 38*  CHOLHDL 2.8 2.7    HEMOGLOBIN A1C Lab Results  Component Value Date   HGBA1C 9.8 (H) 05/23/2020   MPG 234.56 05/23/2020   TSH Recent Labs    10/09/19 0559  TSH 1.422   BNP    Component Value Date/Time   BNP 910.0 (H) 12/09/2019 0243   External labs:  Labs 01/10/2021:  Hemoglobin 12.4/hematocrit 38.2.  Platelets 198.  Serum creatinine 1.62,  BUN 34, sodium 134, potassium 4.5, EGFR 43 mL.  CMP otherwise normal.  A1c 10.6%.  TSH normal.  Total cholesterol 129, triglycerides 140, HDL 46, LDL 55.  Medications and allergies   Allergies  Allergen Reactions   Diltiazem Rash    Current Outpatient Medications on File Prior to Visit  Medication Sig Dispense Refill   aspirin EC 81 MG EC tablet Take 1 tablet (81 mg total) by mouth daily.     carvedilol (COREG) 12.5 MG tablet Take 12.5 mg by mouth 2 (two) times daily with a meal.     Cholecalciferol (VITAMIN D3) 50 MCG (2000 UT) TABS Take 2,000 Units by mouth 2 (two) times daily.      Cyanocobalamin 2500 MCG CHEW Chew 2,500 mcg by mouth daily.      HYDROcodone-acetaminophen (NORCO/VICODIN) 5-325 MG tablet Take 1 tablet by mouth every 4 (four) hours as needed for moderate pain. 20 tablet 0   insulin isophane & regular human (NOVOLIN 70/30 FLEXPEN) (70-30) 100 UNIT/ML KwikPen Inject 30-63 Units into the skin 2 (two) times daily as needed (high blood sugar).     metFORMIN (GLUCOPHAGE-XR) 500 MG 24 hr tablet Take 500 mg by mouth 2 (two) times daily.     nitroGLYCERIN (NITROSTAT) 0.4 MG SL tablet Place 1 tablet (0.4 mg total) under the tongue every 5 (five) minutes x 3 doses as needed for chest pain. 25 tablet 3   Omega-3 Fatty Acids (FISH OIL) 1000 MG CAPS Take 2,000 mg by mouth 3 (three) times daily.     OXYGEN Inhale 2 L/min into the lungs See admin instructions. 2 L/min at bedtime in conjunction with CPAP      potassium chloride SA (KLOR-CON) 20 MEQ tablet Take 1 tablet (20 mEq total) by mouth 2 (two) times daily. 180 tablet 3   PRESCRIPTION MEDICATION Inhale into the lungs See admin instructions. CPAP- At bedtime     rosuvastatin (CRESTOR) 40 MG tablet Take 1 tablet (40 mg total) by mouth at bedtime.     sacubitril-valsartan (ENTRESTO) 97-103 MG Take 1 tablet by mouth 2 (two) times daily. 60 tablet    Semaglutide, 1 MG/DOSE, 2 MG/1.5ML SOPN Inject 1 mg into the skin every Sunday.      sertraline (ZOLOFT) 100 MG tablet Take 50 mg by mouth daily.     ticagrelor (BRILINTA) 90 MG TABS tablet Take 1 tablet (90 mg total) by mouth 2 (two) times daily. 60 tablet    torsemide (DEMADEX) 20 MG tablet Take 1 tablet (20 mg total) by mouth daily as needed (for an overnight weight gain of 3 pounds or more). 90 tablet 3   No current facility-administered medications on file prior to visit.    Radiology:   Chest X-Ray 10/09/2019:  FINDINGS: Unchanged cardiomegaly. Worsened bilateral interstitial opacities. Remote median sternotomy with unchanged position of left chest wall 3 lead AICD. IMPRESSION: Cardiomegaly and  worsened bilateral interstitial opacities likely indicating pulmonary edema.  Cardiac Studies:   Renal artery duplex 09/02/2017: Right: Abnormal right Resistive Index. No evidence of right renal  artery stenosis. Left: Normal left Resistive Index. No evidence of left renal artery stenosis. Mesenteric: Normal Celiac artery findings. 70 to 99% stenosis in the superior mesenteric artery.  Left Heart Catheterization12/14/2020:  1.  Severe underlying three-vessel coronary artery disease with patent LIMA to LAD.  Chronically occluded SVG to OM.  Severe in-stent restenosis in left main stent extending into the proximal left circumflex.  In addition, there is significant restenosis in the stent placed in the left posterior AV groove artery.  The RCA is known to be small in size and severely diseased. 2.  Left ventricular angiography was not performed due to chronic kidney disease.  LVEDP was 13 mmHg.  Coronary angioplasty of the left main and mid circumflex coronary artery 01/11/2019: Successful Wolverine cutting balloon angioplasty of the left main, 3.5 x 10 mm balloon utilized and high-pressure 12 atmospheric pressure inflations performed throughout the in-stent restenotic left main and proximal ostial circumflex and also mid circumflex coronary artery, 99% reduced to 0% with TIMI II  to TIMI-3 flow. 60 mill contrast utilized.  Echocardiogram 12/18/2019:  Poor eco window and wall motion with reduced sensitivity.  Left ventricle cavity is mildly dilated. Dilated cardiomyopathy. Hypokinetic global wall motion. Abnormal septal wall motion due to post-operative coronary artery bypass graft. Indeterminate diastolic filling pattern due to paced rhythm. Moderately depressed LV systolic function with visual EF 30-35%.  Calculated EF 37%. Left atrial cavity is mildly dilated at 4.2 cm. Right ventricle cavity is normal in size. Normal right ventricular function. RV Pacemaker wires visualized. No significant change since 06/15/2019. EF previously 35-40%.   Left Heart Catheterization 10/09/2019:  LV: Normal LVEDP.  There was no pressure gradient across the aortic valve.  Angiogram not performed to conserve contrast. Right coronary artery nondominant and severely diffusely diseased unchanged from prior cardiac catheterization. Left main: Mid to distal left main has in-stent restenosis of 99% and extends into the dominant circumflex which also has proximal in-stent 99% stenosis with TIMI II flow.  Successful scoring balloon angioplasty with 3.5 x 15 mm Wolverine at high 14 atmospheric pressure, due to recall, haziness, stented with 4.0 x 18 mm resolute Onyx DES at 18 atmospheric pressure for 60 seconds, stenosis reduced in the left main and proximal in-stent restenosis from 99% to 0% with improvement in TIMI flow from 2-3. Successful PTCA and Cutting Balloon angioplasty with Wolverine 3.0 x 10 mm balloon in the mid segment of the dominant circumflex coronary artery in-stent restenosis.  Stenosis reduced to 0% at 14 atmospheric pressure inflations x2.  TIMI-3 to TIMI-3 flow maintained. LAD: Is occluded in the proximal segment.  Distal LAD supplied by LIMA.  LIMA to LAD is widely patent.  Left Heart Catheterization 12/11/2019:   LM: Protected. Patent LM/LCx stent LAD: Prox 100% occluded.  Bypassed by patent LIMA attaching in mid LAD        Mid to distal LAD 50% disease LCxL: Patent LM/LCx stent. OM3 60% stenosis RCA: Small caliber with severe diffuse disease         Dampening of the pressures noted on engagement   No change in coronary anatomy compared to prior cath in 09/2019 Current presentation likely due to acute on chronic HFrEF  Carotid artery duplex 08/05/2020: Right Carotid: Patent stent with no evidence of restenosis. Left Carotid: Patent stent with no evidence of restenosis. Vertebrals:  Bilateral vertebral arteries demonstrate antegrade flow.  Device check: Medtronic CRTD Amplia MRI QWuad CRT-D 10/29/14   Scheduled  In office ICD 11/22/19  Single (S)/Dual (D)/BV (M) BV Presenting APBP Pacer dependant: No. Underlying NSR @ 55/min. AP 48%, BP 98.4% AMS Episodes 1.  SVT 11 S. HVR 0 since prior record 11/22/2019.   Longevity 2.7 Years/Voltage. Charge time 9.8Sec. Lead measurements: Stable Histogram: Low (L)/normal (N)/high (H)  Normal Patient activity Low. Thoracic impedance: Fluid accumulation last one month now back to baseline  Observations: Normal BV ICD.  Changes: Changed AP rate to 50 from 60 for preserving battery. Patient is programmed to [place] pace 100%   Scheduled remote ICD transmission 04/24/2020: Longevity 29 months.  RA pacing 15%, BiV pacing 97%.  No mode switches.  1 nonsustained VT, no therapy.  Impedance monitoring does not reveal heart failure.  EKG:      EKG 04/04/2020: AV paced rhythm.  No further analysis.   No significant change from 10/17/2019: AV paced rhythm at rate of 75 bpm. No further analysis.   Assessment     ICD-10-CM   1. Coronary artery disease involving native coronary artery of native heart without angina pectoris  I25.10     2. Essential hypertension  I10     3. Bilateral carotid artery stenosis  I65.23     4. Ischemic cardiomyopathy  I25.5 amiodarone (PACERONE) 200 MG tablet    5. Chronic combined systolic and  diastolic heart failure (HCC)  I50.42 empagliflozin (JARDIANCE) 25 MG TABS tablet    Basic metabolic panel    Brain natriuretic peptide    Magnesium    Pro b natriuretic peptide (BNP)    6. Uncontrolled type 2 diabetes with stage 3 chronic kidney disease GFR 30-59 (HCC)  E11.22 empagliflozin (JARDIANCE) 25 MG TABS tablet   E11.65    N18.30       Meds ordered this encounter  Medications   amiodarone (PACERONE) 200 MG tablet    Sig: Take 0.5 tablets (100 mg total) by mouth daily.   empagliflozin (JARDIANCE) 25 MG TABS tablet    Sig: Take 1 tablet (25 mg total) by mouth daily before breakfast.    Dispense:  90 tablet    Refill:  3    Medications Discontinued During This Encounter  Medication Reason   metFORMIN (GLUCOPHAGE-XR) 500 MG 24 hr tablet Error   amiodarone (PACERONE) 200 MG tablet    Recommendations:   Salvatore Shear  is a 65 y.o. Caucasian male with CAD and history of CABG x2 in 2002  with only LIMA to LAD being patent, Circumflex coronary artery is super dominant. History of stenting to circumflex/OM bifurcation due to recurrent restenosis in 2015, left main and circumflex proximal and midsegment in 2016, angioplasty to left main and proximal and mid circumflex in 2020 and again 10/08/2019. He presented with acute pulmonary edema again on 12/11/2019 and repeat cardiac catheterization essentially revealed widely patent circumflex stent.  Past medical history significant for ischemic cardiomyopathy with severe LV systolic dysfunction, recurrent sustained VT SP ICD implantation, hypertension, mixed hyperlipidemia, uncontrolled diabetes mellitus with stage III chronic kidney disease and bilateral carotid artery stenting (left TCAR on 05/26/2020 and a right TCAR on 07/07/2020) patent per carotid duplex from 08/05/2020.  Fortunately he has been gradually losing weight.  His blood pressure was elevated today as he is in significant amount of pain in his back after he had an accidental fall  a week ago. Hence I did not make any  changes to his medications.  He has not had any angina or dyspnea.  Diabetes continues to be uncontrolled, he was previously on Jardiance or Victoza and was discontinued on admission to [facility] hospital on 03/27/2020 and started on Ozempic.  However his diabetes is still uncontrolled and has been not a significant impact to his weight, would like to restart him back on Jardiance.  There is also no clinical evidence of heart failure today.  ICD does not reveal recent fluid overload.  I will reduce the dose of amiodarone from 200 mg daily to 100 mg daily.  I will see him back in 6 months.  [place], PA-S 08/11/2020, 4:09 PM Office: [phone]  Patient seen and examined in conjunction with Ms. Adline Potter, [place] second year Ship broker at [employer].  Time spent is in direct patient face to face encounter not including the teaching and training involved.  Since initiation of Jardiance, I will obtain BMP, Pro-NT BNP and magnesium levels for surveillance.   Adrian Prows, MD, [facility] 08/11/2020, 9:49 PM Office: [phone]

## 2020-08-18 ENCOUNTER — Ambulatory Visit: Payer: No Typology Code available for payment source | Admitting: Cardiology

## 2020-08-28 DIAGNOSIS — I5042 Chronic combined systolic (congestive) and diastolic (congestive) heart failure: Secondary | ICD-10-CM | POA: Diagnosis not present

## 2020-08-29 DIAGNOSIS — I5020 Unspecified systolic (congestive) heart failure: Secondary | ICD-10-CM | POA: Diagnosis not present

## 2020-08-29 LAB — BASIC METABOLIC PANEL WITH GFR
BUN/Creatinine Ratio: 20 (ref 10–24)
BUN: 38 mg/dL — ABNORMAL HIGH (ref 8–27)
CO2: 16 mmol/L — ABNORMAL LOW (ref 20–29)
Calcium: 9.3 mg/dL (ref 8.6–10.2)
Chloride: 106 mmol/L (ref 96–106)
Creatinine, Ser: 1.88 mg/dL — ABNORMAL HIGH (ref 0.76–1.27)
Glucose: 311 mg/dL — ABNORMAL HIGH (ref 65–99)
Potassium: 4.9 mmol/L (ref 3.5–5.2)
Sodium: 138 mmol/L (ref 134–144)
eGFR: 39 mL/min/{1.73_m2} — ABNORMAL LOW

## 2020-08-29 LAB — PRO B NATRIURETIC PEPTIDE: NT-Pro BNP: 3012 pg/mL — ABNORMAL HIGH (ref 0–210)

## 2020-08-29 LAB — BRAIN NATRIURETIC PEPTIDE: BNP: 297.9 pg/mL — ABNORMAL HIGH (ref 0.0–100.0)

## 2020-08-29 LAB — MAGNESIUM: Magnesium: 2.5 mg/dL — ABNORMAL HIGH (ref 1.6–2.3)

## 2020-08-30 NOTE — Progress Notes (Signed)
Renal function is stable and sodium levels have improved back to normal.  Discrepancy between BNP and proBNP.  Patient clinically very stable without evidence of congestive heart failure.  We will continue to monitor.  Magnesium level normal.

## 2020-09-06 DIAGNOSIS — I5043 Acute on chronic combined systolic (congestive) and diastolic (congestive) heart failure: Secondary | ICD-10-CM | POA: Diagnosis not present

## 2020-09-25 ENCOUNTER — Telehealth: Payer: Self-pay | Admitting: Family Medicine

## 2020-09-25 NOTE — Telephone Encounter (Signed)
Noted  

## 2020-09-25 NOTE — Telephone Encounter (Signed)
I called and spoke with the patient and confirmed that he is taking Crestor and the patient stated he receives it from [place] but he is taking it as directed.  Fay Swider,cma

## 2020-09-25 NOTE — Telephone Encounter (Signed)
I received a fax that indicated that the patient may not be taking his statin.  Can you call him and confirm if he is taking his Crestor?  Thanks.

## 2020-09-29 DIAGNOSIS — I5042 Chronic combined systolic (congestive) and diastolic (congestive) heart failure: Secondary | ICD-10-CM | POA: Diagnosis not present

## 2020-09-29 DIAGNOSIS — Z95810 Presence of automatic (implantable) cardiac defibrillator: Secondary | ICD-10-CM | POA: Diagnosis not present

## 2020-09-29 DIAGNOSIS — Z4502 Encounter for adjustment and management of automatic implantable cardiac defibrillator: Secondary | ICD-10-CM | POA: Diagnosis not present

## 2020-10-03 ENCOUNTER — Telehealth: Payer: Self-pay | Admitting: Cardiology

## 2020-10-03 NOTE — Telephone Encounter (Signed)
Pt Is calling for refill request

## 2020-10-07 ENCOUNTER — Other Ambulatory Visit: Payer: Self-pay

## 2020-10-07 DIAGNOSIS — I5043 Acute on chronic combined systolic (congestive) and diastolic (congestive) heart failure: Secondary | ICD-10-CM | POA: Diagnosis not present

## 2020-10-07 DIAGNOSIS — I255 Ischemic cardiomyopathy: Secondary | ICD-10-CM

## 2020-10-07 MED ORDER — AMIODARONE HCL 200 MG PO TABS: 100.0000 mg | ORAL_TABLET | Freq: Every day | ORAL | 3 refills | Status: AC

## 2020-10-23 ENCOUNTER — Telehealth: Payer: Self-pay | Admitting: Family Medicine

## 2020-10-23 NOTE — Telephone Encounter (Signed)
[employer] calling and reccomending statin therapy for this patient due to a history of Cardiovascular disease . Recommending moderate to high intensity therapy. They will fax over this recommendation paperwork for a response

## 2020-10-29 DIAGNOSIS — I5020 Unspecified systolic (congestive) heart failure: Secondary | ICD-10-CM | POA: Diagnosis not present

## 2020-11-04 DIAGNOSIS — Z95810 Presence of automatic (implantable) cardiac defibrillator: Secondary | ICD-10-CM | POA: Diagnosis not present

## 2020-11-04 DIAGNOSIS — I5042 Chronic combined systolic (congestive) and diastolic (congestive) heart failure: Secondary | ICD-10-CM | POA: Diagnosis not present

## 2020-11-04 DIAGNOSIS — Z4502 Encounter for adjustment and management of automatic implantable cardiac defibrillator: Secondary | ICD-10-CM | POA: Diagnosis not present

## 2020-11-06 DIAGNOSIS — I5043 Acute on chronic combined systolic (congestive) and diastolic (congestive) heart failure: Secondary | ICD-10-CM | POA: Diagnosis not present

## 2020-11-29 DIAGNOSIS — I10 Essential (primary) hypertension: Secondary | ICD-10-CM | POA: Diagnosis not present

## 2020-12-05 ENCOUNTER — Ambulatory Visit (INDEPENDENT_AMBULATORY_CARE_PROVIDER_SITE_OTHER): Payer: Medicare Other

## 2020-12-05 VITALS — Ht 67.0 in | Wt 207.0 lb

## 2020-12-05 DIAGNOSIS — I5042 Chronic combined systolic (congestive) and diastolic (congestive) heart failure: Secondary | ICD-10-CM | POA: Diagnosis not present

## 2020-12-05 DIAGNOSIS — Z4502 Encounter for adjustment and management of automatic implantable cardiac defibrillator: Secondary | ICD-10-CM | POA: Diagnosis not present

## 2020-12-05 DIAGNOSIS — Z95810 Presence of automatic (implantable) cardiac defibrillator: Secondary | ICD-10-CM | POA: Diagnosis not present

## 2020-12-05 DIAGNOSIS — Z0000 Encounter for general adult medical examination without abnormal findings: Secondary | ICD-10-CM

## 2020-12-05 NOTE — Progress Notes (Signed)
Subjective:   Martin Bush is a 65 y.o. male who presents for Medicare Annual/Subsequent preventive examination.  Review of Systems    No ROS.  Medicare Wellness Virtual Visit.  Visual/audio telehealth visit, UTA vital signs.   See social history for additional risk factors.   Cardiac Risk Factors include: advanced age (>43men, >63 women);male gender;diabetes mellitus     Objective:    Today's Vitals   12/05/20 1453  Weight: 207 lb (93.9 kg)  Height: 5\' 7"  (9.323 m)   Body mass index is 32.42 kg/m.  Advanced Directives 12/05/2020 07/07/2020 07/02/2020 05/23/2020 12/09/2019 12/05/2019 02/28/2019  Does Patient Have a Medical Advance Directive? No No No No No No No  Would patient like information on creating a medical advance directive? No - Patient declined No - Patient declined No - Patient declined Yes (MAU/Ambulatory/Procedural Areas - Information given) Yes (Inpatient - patient requests chaplain consult to create a medical advance directive) No - Patient declined No - Patient declined    Current Medications (verified) Outpatient Encounter Medications as of 12/05/2020  Medication Sig   amiodarone (PACERONE) 200 MG tablet Take 0.5 tablets (100 mg total) by mouth daily.   aspirin EC 81 MG EC tablet Take 1 tablet (81 mg total) by mouth daily.   carvedilol (COREG) 12.5 MG tablet Take 12.5 mg by mouth 2 (two) times daily with a meal.   Cholecalciferol (VITAMIN D3) 50 MCG (2000 UT) TABS Take 2,000 Units by mouth 2 (two) times daily.    Cyanocobalamin 2500 MCG CHEW Chew 2,500 mcg by mouth daily.    empagliflozin (JARDIANCE) 25 MG TABS tablet Take 1 tablet (25 mg total) by mouth daily before breakfast.   insulin isophane & regular human (NOVOLIN 70/30 FLEXPEN) (70-30) 100 UNIT/ML KwikPen Inject 30-63 Units into the skin 2 (two) times daily as needed (high blood sugar).   metFORMIN (GLUCOPHAGE-XR) 500 MG 24 hr tablet Take 500 mg by mouth 2 (two) times daily.   nitroGLYCERIN  (NITROSTAT) 0.4 MG SL tablet Place 1 tablet (0.4 mg total) under the tongue every 5 (five) minutes x 3 doses as needed for chest pain.   Omega-3 Fatty Acids (FISH OIL) 1000 MG CAPS Take 2,000 mg by mouth 3 (three) times daily.   OXYGEN Inhale 2 L/min into the lungs See admin instructions. 2 L/min at bedtime in conjunction with CPAP    potassium chloride SA (KLOR-CON) 20 MEQ tablet Take 1 tablet (20 mEq total) by mouth 2 (two) times daily.   PRESCRIPTION MEDICATION Inhale into the lungs See admin instructions. CPAP- At bedtime   rosuvastatin (CRESTOR) 40 MG tablet Take 1 tablet (40 mg total) by mouth at bedtime.   sacubitril-valsartan (ENTRESTO) 97-103 MG Take 1 tablet by mouth 2 (two) times daily.   Semaglutide, 1 MG/DOSE, 2 MG/1.5ML SOPN Inject 1 mg into the skin every Sunday.   sertraline (ZOLOFT) 100 MG tablet Take 50 mg by mouth daily.   ticagrelor (BRILINTA) 90 MG TABS tablet Take 1 tablet (90 mg total) by mouth 2 (two) times daily.   torsemide (DEMADEX) 20 MG tablet Take 1 tablet (20 mg total) by mouth daily as needed (for an overnight weight gain of 3 pounds or more).   [DISCONTINUED] HYDROcodone-acetaminophen (NORCO/VICODIN) 5-325 MG tablet Take 1 tablet by mouth every 4 (four) hours as needed for moderate pain.   No facility-administered encounter medications on file as of 12/05/2020.    Allergies (verified) Diltiazem   History: Past Medical History:  Diagnosis Date  AICD (automatic cardioverter/defibrillator) present    Medtronic   Arthritis    "thumbs"  (08/02/2017)   CHF (congestive heart failure) (HCC)    Chronic combined systolic and diastolic heart failure ([place])    a. 08/2017 Echo: EF 20-25%, mod glob HK. Sev distal ant sept, inflat HK. Apical AK. Gr2 DD. Mildly reduced RV fxn.   Colon polyps    Coronary artery disease    a. s/p CABG x 2 (LIMA->LAD, VG->OM); b. Multiple PCI's to LM/LCX/OM; c. 07/2017 PTCA of LM/LCX w/ early ISR-->repeat PCI/DES to LM (3.5x20 Synergy DES)  and LCX (3.0x20 Synergy DES); d. 07/2018 Relook Cath: LM patent stent, LAD 100ost, LCX patent stent, OM1 99/60, LIMA->LAD ok. VG->dLCX 29 (old).   Depression    Encounter for assessment of implantable cardioverter-defibrillator (ICD) 09/27/2018   High cholesterol    History of hiatal hernia    Hypertension    ICD; Biventricular  Medtronic ICD Amplia MRI QWuad CRT-D  in situ 10/29/14 10/29/2014   Remote ICD check 09.23.20:  One 6 beat NSVT. No therapy.  1 SVT episode @ 130 bpm (38 Sec).  There were 23 Vent sense episodes detected for up to 1.1 min/day (AT). Health trends (patient activity, heart rate variability, average heart rates) are stable.Trans-thoracic impedance trends and the OptiVol Fluid Index do no present significant abnormalities. Battery longevity is 4.3 years. RA pacing is 3   Ischemic cardiomyopathy 09/27/2018   MI (myocardial infarction) ([place]) 2003   NSTEMI (non-ST elevated myocardial infarction) ([place]) 07/16/2014   OSA on CPAP    Oxygen deficiency    Peripheral vascular disease ([place])    Pneumonia 10/2014   Proteinuria    Sleep apnea    Type II diabetes mellitus ([place])    insulin dependent   Past Surgical History:  Procedure Laterality Date   BIOPSY  09/19/2018   Procedure: BIOPSY;  Surgeon: Thornton Park, MD;  [facility]: [facility];  Service: Gastroenterology;;   CARDIAC CATHETERIZATION N/A 07/16/2014   Procedure: Left Heart Cath and Coronary Angiography;  Surgeon: Adrian Prows, MD;  Location: [place] [facility];  Service: Cardiovascular;  Laterality: N/A;   CARDIAC CATHETERIZATION  07/16/2014   Procedure: Coronary Balloon Angioplasty;  Surgeon: Adrian Prows, MD;  Location: [place] [facility];  Service: Cardiovascular;;   CARDIAC CATHETERIZATION  2003   CARDIAC DEFIBRILLATOR PLACEMENT  2016   CATARACT EXTRACTION Left 2018   CATARACT EXTRACTION W/ INTRAOCULAR LENS IMPLANT Left 03/2014   COLONOSCOPY     COLONOSCOPY WITH PROPOFOL N/A 09/19/2018   Procedure: COLONOSCOPY WITH  PROPOFOL;  Surgeon: Thornton Park, MD;  [facility];  Service: Gastroenterology;  Laterality: N/A;   CORONARY ANGIOPLASTY WITH STENT PLACEMENT     "I've got a total of 8 stents in there; mostly doine at [facility]" (08/02/2017)   CORONARY ARTERY BYPASS GRAFT  ~ 2003   "CABG X2"; [facility]   CORONARY ATHERECTOMY N/A 08/16/2017   Procedure: CORONARY ATHERECTOMY;  Surgeon: Nigel Mormon, MD;  Location: [place] [facility];  Service: Cardiovascular;  Laterality: N/A;   CORONARY BALLOON ANGIOPLASTY N/A 08/04/2017   Procedure: CORONARY BALLOON ANGIOPLASTY;  Surgeon: Adrian Prows, MD;  Location: [place] [facility];  Service: Cardiovascular;  Laterality: N/A;   CORONARY BALLOON ANGIOPLASTY N/A 01/11/2019   Procedure: CORONARY BALLOON ANGIOPLASTY;  Surgeon: Adrian Prows, MD;  Location: [place] [facility];  Service: Cardiovascular;  Laterality: N/A;   CORONARY BALLOON ANGIOPLASTY N/A 10/09/2019   Procedure: CORONARY BALLOON ANGIOPLASTY;  Surgeon: Einar Gip,  Ulice Dash, MD;  Location: [place] [facility];  Service: Cardiovascular;  Laterality: N/A;   CORONARY STENT INTERVENTION N/A 08/16/2017   Procedure: CORONARY STENT INTERVENTION;  Surgeon: Nigel Mormon, MD;  Location: [place] [facility];  Service: Cardiovascular;  Laterality: N/A;   CORONARY STENT INTERVENTION N/A 10/09/2019   Procedure: CORONARY STENT INTERVENTION;  Surgeon: Adrian Prows, MD;  Location: [place] [facility];  Service: Cardiovascular;  Laterality: N/A;   ELBOW SURGERY Left ?2001   "pinched nerve"   ENDOSCOPIC MUCOSAL RESECTION  12/18/2018   Procedure: ENDOSCOPIC MUCOSAL RESECTION;  Surgeon: Rush Landmark Telford Nab., MD;  Location: [place];  Service: Gastroenterology;;   EYE SURGERY Left    cataract removal   FLEXIBLE SIGMOIDOSCOPY N/A 12/18/2018   Procedure: FLEXIBLE SIGMOIDOSCOPY;  Surgeon: Irving Copas., MD;  Location: [place];  Service: Gastroenterology;  Laterality: N/A;   HEMOSTASIS CLIP PLACEMENT   12/18/2018   Procedure: HEMOSTASIS CLIP PLACEMENT;  Surgeon: Irving Copas., MD;  Location: [place];  Service: Gastroenterology;;   LEFT HEART CATH AND CORONARY ANGIOGRAPHY N/A 01/11/2019   Procedure: LEFT HEART CATH AND CORONARY ANGIOGRAPHY;  Surgeon: Adrian Prows, MD;  Location: [place] [facility];  Service: Cardiovascular;  Laterality: N/A;   LEFT HEART CATH AND CORS/GRAFTS ANGIOGRAPHY N/A 08/04/2017   Procedure: LEFT HEART CATH AND CORS/GRAFTS ANGIOGRAPHY;  Surgeon: Adrian Prows, MD;  Location: [place] [facility];  Service: Cardiovascular;  Laterality: N/A;   LEFT HEART CATH AND CORS/GRAFTS ANGIOGRAPHY N/A 08/20/2017   Procedure: LEFT HEART CATH AND CORS/GRAFTS ANGIOGRAPHY;  Surgeon: Nigel Mormon, MD;  Location: [place] [facility];  Service: Cardiovascular;  Laterality: N/A;   LEFT HEART CATH AND CORS/GRAFTS ANGIOGRAPHY N/A 01/08/2019   Procedure: LEFT HEART CATH AND CORS/GRAFTS ANGIOGRAPHY;  Surgeon: [place] Hampshire, MD;  Location: [place] [facility];  Service: Cardiovascular;  Laterality: N/A;   LEFT HEART CATH AND CORS/GRAFTS ANGIOGRAPHY N/A 10/09/2019   Procedure: LEFT HEART CATH AND CORS/GRAFTS ANGIOGRAPHY;  Surgeon: Adrian Prows, MD;  Location: [place] [facility];  Service: Cardiovascular;  Laterality: N/A;   LEFT HEART CATH AND CORS/GRAFTS ANGIOGRAPHY N/A 12/11/2019   Procedure: LEFT HEART CATH AND CORS/GRAFTS ANGIOGRAPHY;  Surgeon: Nigel Mormon, MD;  Location: [place] [facility];  Service: Cardiovascular;  Laterality: N/A;   POLYPECTOMY  09/19/2018   Procedure: POLYPECTOMY;  Surgeon: Thornton Park, MD;  [facility];  Service: Gastroenterology;;   Lia Foyer INJECTION  09/19/2018   Procedure: SUBMUCOSAL INJECTION;  Surgeon: Thornton Park, MD;  [facility];  Service: Gastroenterology;;   Lia Foyer LIFTING INJECTION  12/18/2018   Procedure: SUBMUCOSAL LIFTING INJECTION;  Surgeon: Irving Copas., MD;  Location: [place];   Service: Gastroenterology;;   TRANSCAROTID ARTERY REVASCULARIZATION  Left 05/26/2020   Procedure: LEFT TRANSCAROTID ARTERY REVASCULARIZATION;  Surgeon: [name], MD;  Location: [place];  Service: Vascular;  Laterality: Left;   TRANSCAROTID ARTERY REVASCULARIZATION  Right 07/07/2020   Procedure: RIGHT TRANSCAROTID ARTERY REVASCULARIZATION;  Surgeon: [name], MD;  Location: [place];  Service: Vascular;  Laterality: Right;   ULTRASOUND GUIDANCE FOR VASCULAR ACCESS Right 05/26/2020   Procedure: ULTRASOUND GUIDANCE FOR VASCULAR ACCESS;  Surgeon: [name], MD;  Location: [place];  Service: Vascular;  Laterality: Right;   ULTRASOUND GUIDANCE FOR VASCULAR ACCESS Left 07/07/2020   Procedure: ULTRASOUND GUIDANCE FOR VASCULAR ACCESS;  Surgeon: [name], MD;  Location: [place];  Service: Vascular;  Laterality: Left;   Family History  Problem Relation  Age of Onset   Uterine cancer Mother    Lung cancer Mother    Hyperlipidemia Father    Heart disease Father    Hypertension Father    Diabetes Father    Hypertension Sister    Hypertension Brother    Colon cancer Neg Hx    Social History   Socioeconomic History   Marital status: Married    Spouse name: Not on file   Number of children: 3   Years of education: Not on file   Highest education level: Not on file  Occupational History   Not on file  Tobacco Use   Smoking status: Former    Packs/day: 0.25    Years: 27.00    Pack years: 6.75    Types: 47, Cigarettes    Quit date: 2002    Years since quitting: 20.8   Smokeless tobacco: Former    Types: Chew    Quit date: 2002  Vaping Use   Vaping Use: Never used  Substance and Sexual Activity   Alcohol use: Not Currently   Drug use: Never   Sexual activity: Not Currently  Other Topics Concern   Not on file  Social History Narrative   Not on file   Social Determinants of Health   Financial Resource Strain: Low Risk    Difficulty of Paying  Living Expenses: Not hard at all  Food Insecurity: No Food Insecurity   Worried About Charity fundraiser in the Last Year: Never true   Arboriculturist in the Last Year: Never true  Transportation Needs: No Transportation Needs   Lack of Transportation (Medical): No   Lack of Transportation (Non-Medical): No  Physical Activity: Not on file  Stress: No Stress Concern Present   Feeling of Stress : Not at all  Social Connections: Socially Integrated   Frequency of Communication with Friends and Family: Twice a week   Frequency of Social Gatherings with Friends and Family: Twice a week   Attends Religious Services: More than 4 times per year   Active Member of [employer] or Organizations: Yes   Attends Archivist Meetings: Never   Marital Status: Married    Tobacco Counseling Counseling given: Not Answered   Clinical Intake:  Pre-visit preparation completed: Yes        [facility]: [facility])  How often do you need to have someone help you when you read instructions, pamphlets, or other written materials from your doctor or pharmacy?: 1 - Never  Nutrition Risk Assessment: Does the patient have any non-healing wounds?  No  Has the patient had any unintentional weight loss or weight gain?  No   Diabetes: How often do you monitor your CBG's? Once a week.   Financial Strains and Diabetes Management: Are you having any financial strains with the device, your supplies or your medication? No .  Does the patient want to be seen by Chronic Care Management for management of their diabetes?  No  Would the patient like to be referred to a Nutritionist or for Diabetic Management?  No     Interpreter Needed?: No      Activities of Daily Living In your present state of health, do you have any difficulty performing the following activities: 12/05/2020 07/07/2020  Hearing? N N  Vision? N N  Comment - -  Difficulty concentrating or making decisions? N N  Walking or  climbing stairs? N N  Dressing or bathing? N N  Doing  errands, shopping? N -  Preparing Food and eating ? N -  Using the Toilet? N -  In the past six months, have you accidently leaked urine? N -  Do you have problems with loss of bowel control? N -  Managing your Medications? N -  Managing your Finances? N -  Housekeeping or managing your Housekeeping? N -  Some recent data might be hidden    Patient Care Team: [name], MD as PCP - General (Family Medicine) Adrian Prows, MD as Consulting Physician (Cardiology)  Indicate any recent Medical Services you may have received from other than [facility] providers in the past year (date may be approximate).     Assessment:   This is a routine wellness examination for [name].   Virtual Visit via Telephone Note  I connected with  Martin Bush on 12/05/20 at  2:45 PM EST by telephone and verified that I am speaking with the correct person using two identifiers.  Location: Patient: home Provider: office Persons participating in the virtual visit: patient/Nurse Health Advisor   I discussed the limitations, risks, security and privacy concerns of performing an evaluation and management service by telephone and the availability of in person appointments. The patient expressed understanding and agreed to proceed.  Interactive audio and video telecommunications were attempted between this nurse and patient, however failed, due to patient having technical difficulties OR patient did not have access to video capability.  We continued and completed visit with audio only.  Some vital signs may be absent or patient reported.   Hearing/Vision screen Hearing Screening - Comments:: Patient is able to hear conversational tones without difficulty.  No issues reported. Vision Screening - Comments:: Followed by [facility]  Wears corrective lenses Cataract extraction, L  They have regular follow up with the ophthalmologist  Dietary  issues and exercise activities discussed: Current Exercise Habits: Home exercise routine, Intensity: Mild Healthy diet   Goals Addressed               This Visit's Progress     Patient Stated     Maintain weight at 207lb (pt-stated)        Portion controlled meals       Depression Screen PHQ 2/9 Scores 12/05/2020 12/05/2019 07/02/2019 03/06/2019 01/15/2019 12/04/2018 09/29/2018  PHQ - 2 Score 0 0 0 0 0 0 0  PHQ- 9 Score - - 0 1 - - 0    Fall Risk Fall Risk  12/05/2020 12/05/2019 06/05/2019 03/27/2019 02/28/2019  Falls in the past year? 0 0 0 0 0  Comment - - - - -  Number falls in past yr: 0 0 0 0 0  Comment - - No new falls reported - -  Injury with Fall? - - 0 0 0  Comment - - N/A- no falls reported - -  Risk for fall due to : - - Medication side effect - No Fall Risks  Risk for fall due to: Comment - - - - -  Follow up Falls evaluation completed Falls evaluation completed Falls prevention discussed Falls evaluation completed -    FALL RISK PREVENTION PERTAINING TO THE HOME: Home free of loose throw rugs in walkways, pet beds, electrical cords, etc? Yes  Adequate lighting in your home to reduce risk of falls? Yes   ASSISTIVE DEVICES UTILIZED TO PREVENT FALLS: Use of a cane, walker or w/c? No   TIMED UP AND GO: Was the test performed? No . Telehealth visit.  Cognitive Function: Patient is alert and oriented x3.  Enjoys video games. Denies difficulty focusing, making decisions, memory loss.   MMSE - Mini Mental State Exam 04/25/2017 03/30/2016  Orientation to time 5 5  Orientation to Place 5 5  Registration 3 3  Attention/ Calculation 5 5  Recall 3 3  Language- name 2 objects 2 2  Language- repeat 1 1  Language- follow 3 step command 3 3  Language- read & follow direction 1 1  Write a sentence 1 1  Copy design 1 1  Total score 30 30     6CIT Screen 12/05/2020 12/04/2018  What Year? 0 points 0 points  What month? 0 points 0 points  What time? 0 points 0 points   Count back from 20 - 0 points  Months in reverse 0 points 0 points  Repeat phrase - 0 points  Total Score - 0    Immunizations Immunization History  Administered Date(s) Administered   Influenza,inj,Quad PF,6+ Mos 03/15/2016, 09/29/2018   Influenza-Unspecified 11/26/2014, 09/24/2019   Moderna Sars-Covid-2 Vaccination 03/27/2019, 04/24/2019   Pneumococcal-Unspecified 11/14/2013   Tdap 02/25/2014   Screening Tests Health Maintenance  Topic Date Due   COVID-19 Vaccine (4 - Booster for Moderna series) 12/21/2020 (Originally 03/11/2020)   HEMOGLOBIN A1C  01/23/2021 (Originally 11/22/2020)   INFLUENZA VACCINE  04/24/2021 (Originally 08/25/2020)   Pneumococcal Vaccine 38-4 Years old (1 - PCV) 12/05/2021 (Originally 12/24/1961)   FOOT EXAM  10/09/2021   OPHTHALMOLOGY EXAM  11/05/2021   TETANUS/TDAP  02/26/2024   COLONOSCOPY (Pts 45-76yrs Insurance coverage will need to be confirmed)  09/18/2028   Hepatitis C Screening  Completed   HIV Screening  Completed   Zoster Vaccines- Shingrix  Completed   HPV VACCINES  Aged Out    Health Maintenance There are no preventive care reminders to display for this patient.  [facility] Agrees to update record. A1C deferred in health maintenance.   Vision Screening: Recommended annual ophthalmology exams for early detection of glaucoma and other disorders of the eye.  Dental Screening: Recommended annual dental exams for proper oral hygiene  Community Resource Referral / Chronic Care Management: CRR required this visit?  No   CCM required this visit?  No      Plan:   Keep all routine maintenance appointments.   I have personally reviewed and noted the following in the patient's chart:   Medical and social history Use of alcohol, tobacco or illicit drugs  Current medications and supplements including opioid prescriptions. Patient is not currently taking opioid prescriptions. Functional ability and status Nutritional  status Physical activity Advanced directives List of other physicians Hospitalizations, surgeries, and ER visits in previous 12 months Vitals Screenings to include cognitive, depression, and falls Referrals and appointments  In addition, I have reviewed and discussed with patient certain preventive protocols, quality metrics, and best practice recommendations. A written personalized care plan for preventive services as well as general preventive health recommendations were provided to patient.     Varney Biles, LPN   [id number]

## 2020-12-05 NOTE — Patient Instructions (Addendum)
Martin Bush , Thank you for taking time to come for your Medicare Wellness Visit. I appreciate your ongoing commitment to your health goals. Please review the following plan we discussed and let me know if I can assist you in the future.   These are the goals we discussed:  Goals       Patient Stated     Maintain weight at 207lb (pt-stated)      Portion controlled meals        This is a list of the screening recommended for you and due dates:  Health Maintenance  Topic Date Due   COVID-19 Vaccine (4 - Booster for Moderna series) 12/21/2020*   Hemoglobin A1C  01/23/2021*   Flu Shot  04/24/2021*   Pneumococcal Vaccination (1 - PCV) 12/05/2021*   Complete foot exam   10/09/2021   Eye exam for diabetics  11/05/2021   Tetanus Vaccine  02/26/2024   Colon Cancer Screening  09/18/2028   Hepatitis C Screening: USPSTF Recommendation to screen - Ages 18-79 yo.  Completed   HIV Screening  Completed   Zoster (Shingles) Vaccine  Completed   HPV Vaccine  Aged Out  *Topic was postponed. The date shown is not the original due date.    Advanced directives: not yet completed  Conditions/risks identified: none new  Follow up in one year for your annual wellness visit.   Preventive Care 65 Years and Older, Male Preventive care refers to lifestyle choices and visits with your health care provider that can promote health and wellness. What does preventive care include? A yearly physical exam. This is also called an annual well check. Dental exams once or twice a year. Routine eye exams. Ask your health care provider how often you should have your eyes checked. Personal lifestyle choices, including: Daily care of your teeth and gums. Regular physical activity. Eating a healthy diet. Avoiding tobacco and drug use. Limiting alcohol use. Practicing safe sex. Taking low doses of aspirin every day. Taking vitamin and mineral supplements as recommended by your health care provider. What  happens during an annual well check? The services and screenings done by your health care provider during your annual well check will depend on your age, overall health, lifestyle risk factors, and family history of disease. Counseling  Your health care provider may ask you questions about your: Alcohol use. Tobacco use. Drug use. Emotional well-being. Home and relationship well-being. Sexual activity. Eating habits. History of falls. Memory and ability to understand (cognition). Work and work Statistician. Screening  You may have the following tests or measurements: Height, weight, and BMI. Blood pressure. Lipid and cholesterol levels. These may be checked every 5 years, or more frequently if you are over 27 years old. Skin check. Lung cancer screening. You may have this screening every year starting at age 2 if you have a 30-pack-year history of smoking and currently smoke or have quit within the past 15 years. Fecal occult blood test (FOBT) of the stool. You may have this test every year starting at age 34. Flexible sigmoidoscopy or colonoscopy. You may have a sigmoidoscopy every 5 years or a colonoscopy every 10 years starting at age 70. Prostate cancer screening. Recommendations will vary depending on your family history and other risks. Hepatitis C blood test. Hepatitis B blood test. Sexually transmitted disease (STD) testing. Diabetes screening. This is done by checking your blood sugar (glucose) after you have not eaten for a while (fasting). You may have this done every 1-3 years.  Abdominal aortic aneurysm (AAA) screening. You may need this if you are a current or former smoker. Osteoporosis. You may be screened starting at age 39 if you are at high risk. Talk with your health care provider about your test results, treatment options, and if necessary, the need for more tests. Vaccines  Your health care provider may recommend certain vaccines, such as: Influenza vaccine. This  is recommended every year. Tetanus, diphtheria, and acellular pertussis (Tdap, Td) vaccine. You may need a Td booster every 10 years. Zoster vaccine. You may need this after age 46. Pneumococcal 13-valent conjugate (PCV13) vaccine. One dose is recommended after age 46. Pneumococcal polysaccharide (PPSV23) vaccine. One dose is recommended after age 3. Talk to your health care provider about which screenings and vaccines you need and how often you need them. This information is not intended to replace advice given to you by your health care provider. Make sure you discuss any questions you have with your health care provider. Document Released: 02/07/2015 Document Revised: 10/01/2015 Document Reviewed: 11/12/2014 Elsevier Interactive Patient Education  2017 [place] Prevention in the Home Falls can cause injuries. They can happen to people of all ages. There are many things you can do to make your home safe and to help prevent falls. What can I do on the outside of my home? Regularly fix the edges of walkways and driveways and fix any cracks. Remove anything that might make you trip as you walk through a door, such as a raised step or threshold. Trim any bushes or trees on the path to your home. Use bright outdoor lighting. Clear any walking paths of anything that might make someone trip, such as rocks or tools. Regularly check to see if handrails are loose or broken. Make sure that both sides of any steps have handrails. Any raised decks and porches should have guardrails on the edges. Have any leaves, snow, or ice cleared regularly. Use sand or salt on walking paths during winter. Clean up any spills in your garage right away. This includes oil or grease spills. What can I do in the bathroom? Use night lights. Install grab bars by the toilet and in the tub and shower. Do not use towel bars as grab bars. Use non-skid mats or decals in the tub or shower. If you need to sit down  in the shower, use a plastic, non-slip stool. Keep the floor dry. Clean up any water that spills on the floor as soon as it happens. Remove soap buildup in the tub or shower regularly. Attach bath mats securely with double-sided non-slip rug tape. Do not have throw rugs and other things on the floor that can make you trip. What can I do in the bedroom? Use night lights. Make sure that you have a light by your bed that is easy to reach. Do not use any sheets or blankets that are too big for your bed. They should not hang down onto the floor. Have a firm chair that has side arms. You can use this for support while you get dressed. Do not have throw rugs and other things on the floor that can make you trip. What can I do in the kitchen? Clean up any spills right away. Avoid walking on wet floors. Keep items that you use a lot in easy-to-reach places. If you need to reach something above you, use a strong step stool that has a grab bar. Keep electrical cords out of the way. Do not  use floor polish or wax that makes floors slippery. If you must use wax, use non-skid floor wax. Do not have throw rugs and other things on the floor that can make you trip. What can I do with my stairs? Do not leave any items on the stairs. Make sure that there are handrails on both sides of the stairs and use them. Fix handrails that are broken or loose. Make sure that handrails are as long as the stairways. Check any carpeting to make sure that it is firmly attached to the stairs. Fix any carpet that is loose or worn. Avoid having throw rugs at the top or bottom of the stairs. If you do have throw rugs, attach them to the floor with carpet tape. Make sure that you have a light switch at the top of the stairs and the bottom of the stairs. If you do not have them, ask someone to add them for you. What else can I do to help prevent falls? Wear shoes that: Do not have high heels. Have rubber bottoms. Are comfortable  and fit you well. Are closed at the toe. Do not wear sandals. If you use a stepladder: Make sure that it is fully opened. Do not climb a closed stepladder. Make sure that both sides of the stepladder are locked into place. Ask someone to hold it for you, if possible. Clearly mark and make sure that you can see: Any grab bars or handrails. First and last steps. Where the edge of each step is. Use tools that help you move around (mobility aids) if they are needed. These include: Canes. Walkers. Scooters. Crutches. Turn on the lights when you go into a dark area. Replace any light bulbs as soon as they burn out. Set up your furniture so you have a clear path. Avoid moving your furniture around. If any of your floors are uneven, fix them. If there are any pets around you, be aware of where they are. Review your medicines with your doctor. Some medicines can make you feel dizzy. This can increase your chance of falling. Ask your doctor what other things that you can do to help prevent falls. This information is not intended to replace advice given to you by your health care provider. Make sure you discuss any questions you have with your health care provider. Document Released: 11/07/2008 Document Revised: 06/19/2015 Document Reviewed: 02/15/2014 Elsevier Interactive Patient Education  2017 [employer].

## 2020-12-07 DIAGNOSIS — I5043 Acute on chronic combined systolic (congestive) and diastolic (congestive) heart failure: Secondary | ICD-10-CM | POA: Diagnosis not present

## 2020-12-10 ENCOUNTER — Other Ambulatory Visit: Payer: Self-pay

## 2020-12-10 ENCOUNTER — Ambulatory Visit: Payer: Self-pay | Admitting: Cardiology

## 2020-12-10 DIAGNOSIS — Z006 Encounter for examination for normal comparison and control in clinical research program: Secondary | ICD-10-CM

## 2020-12-10 NOTE — Progress Notes (Signed)
Physical Exam Vitals and nursing note reviewed.  Constitutional:      General: He is not in acute distress.    Appearance: He is well-developed.  HENT:     Head: Normocephalic and atraumatic.  Eyes:     Conjunctiva/sclera: Conjunctivae normal.     Pupils: Pupils are equal, round, and reactive to light.  Neck:     Vascular: No JVD.  Cardiovascular:     Rate and Rhythm: Normal rate and regular rhythm.     Pulses: Normal pulses and intact distal pulses.     Heart sounds: No murmur heard. Pulmonary:     Effort: Pulmonary effort is normal.     Breath sounds: Normal breath sounds. No wheezing or rales.  Abdominal:     General: Bowel sounds are normal.     Palpations: Abdomen is soft.     Tenderness: There is no rebound.  Musculoskeletal:        General: No tenderness. Normal range of motion.     Left lower leg: No edema.  Lymphadenopathy:     Cervical: No cervical adenopathy.  Skin:    General: Skin is warm and dry.  Neurological:     Mental Status: He is alert and oriented to person, place, and time.     Cranial Nerves: No cranial nerve deficit.

## 2020-12-24 DIAGNOSIS — Z4502 Encounter for adjustment and management of automatic implantable cardiac defibrillator: Secondary | ICD-10-CM | POA: Insufficient documentation

## 2020-12-25 ENCOUNTER — Other Ambulatory Visit: Payer: Self-pay

## 2020-12-25 ENCOUNTER — Ambulatory Visit: Payer: No Typology Code available for payment source | Admitting: Cardiology

## 2020-12-25 DIAGNOSIS — Z95810 Presence of automatic (implantable) cardiac defibrillator: Secondary | ICD-10-CM | POA: Diagnosis not present

## 2020-12-25 DIAGNOSIS — I255 Ischemic cardiomyopathy: Secondary | ICD-10-CM | POA: Diagnosis not present

## 2020-12-25 DIAGNOSIS — Z4502 Encounter for adjustment and management of automatic implantable cardiac defibrillator: Secondary | ICD-10-CM

## 2020-12-25 NOTE — Progress Notes (Signed)
Chief Complaint  Patient presents with   Pacemaker Check   ICD; Biventricular  [employer] ICD Amplia MRI QWuad CRT-D  in situ 10/29/14  Encounter for assessment of implantable cardioverter-defibrillator (ICD)  Ischemic cardiomyopathy  Remote CRT-D transmission 12/05/2020: AP 12%, CRT 96%.  Longevity 2 years.  Lead impedance and thresholds within normal limits.  There were brief ventricular sensing episodes, longest 36 seconds, EGM = atrial tachycardia.  There was no ventricular tachycardia or therapy. Patient activity remains low.  Thoracic impedance is at baseline and does not suggest volume overload state.  Normal CRT-D function.  Scheduled  In office ICD 12/25/20  Single (S)/Dual (D)/BV (M) BV Presenting ASBP Pacer dependant: NO. Underlying ASVS @ 60/min. AP 11%, BP 98.% AMS Episodes 0.   HVR 1. Longest 1 Sec EGM = 6 beat NSVT, Latest 12/22/20.  Longevity 2  Years/Voltage.  Lead measurements: Stable Histogram: Low (L)/normal (N)/high (H)  Normal. Patient activity Good and improved. Thoracic impedance: At baseline and no fluid overload state  Observations: Normal ICD function.  Changes: None   Adrian Prows, MD, [facility] 12/25/2020, 2:30 PM Office: [phone] Fax: [phone] Pager: [phone]

## 2021-01-05 DIAGNOSIS — I5042 Chronic combined systolic (congestive) and diastolic (congestive) heart failure: Secondary | ICD-10-CM | POA: Diagnosis not present

## 2021-01-05 DIAGNOSIS — Z4502 Encounter for adjustment and management of automatic implantable cardiac defibrillator: Secondary | ICD-10-CM | POA: Diagnosis not present

## 2021-01-05 DIAGNOSIS — Z95810 Presence of automatic (implantable) cardiac defibrillator: Secondary | ICD-10-CM | POA: Diagnosis not present

## 2021-01-06 DIAGNOSIS — I5043 Acute on chronic combined systolic (congestive) and diastolic (congestive) heart failure: Secondary | ICD-10-CM | POA: Diagnosis not present

## 2021-01-06 DIAGNOSIS — Z95810 Presence of automatic (implantable) cardiac defibrillator: Secondary | ICD-10-CM | POA: Diagnosis not present

## 2021-01-06 DIAGNOSIS — Z4502 Encounter for adjustment and management of automatic implantable cardiac defibrillator: Secondary | ICD-10-CM | POA: Diagnosis not present

## 2021-01-06 DIAGNOSIS — I5042 Chronic combined systolic (congestive) and diastolic (congestive) heart failure: Secondary | ICD-10-CM | POA: Diagnosis not present

## 2021-02-05 DIAGNOSIS — Z4502 Encounter for adjustment and management of automatic implantable cardiac defibrillator: Secondary | ICD-10-CM | POA: Diagnosis not present

## 2021-02-05 DIAGNOSIS — I5042 Chronic combined systolic (congestive) and diastolic (congestive) heart failure: Secondary | ICD-10-CM | POA: Diagnosis not present

## 2021-02-05 DIAGNOSIS — Z95810 Presence of automatic (implantable) cardiac defibrillator: Secondary | ICD-10-CM | POA: Diagnosis not present

## 2021-02-06 DIAGNOSIS — I5043 Acute on chronic combined systolic (congestive) and diastolic (congestive) heart failure: Secondary | ICD-10-CM | POA: Diagnosis not present

## 2021-02-07 ENCOUNTER — Encounter: Payer: Self-pay | Admitting: Cardiology

## 2021-02-11 ENCOUNTER — Ambulatory Visit: Payer: No Typology Code available for payment source | Admitting: Cardiology

## 2021-03-08 DIAGNOSIS — Z95810 Presence of automatic (implantable) cardiac defibrillator: Secondary | ICD-10-CM | POA: Diagnosis not present

## 2021-03-08 DIAGNOSIS — Z4502 Encounter for adjustment and management of automatic implantable cardiac defibrillator: Secondary | ICD-10-CM | POA: Diagnosis not present

## 2021-03-08 DIAGNOSIS — I5042 Chronic combined systolic (congestive) and diastolic (congestive) heart failure: Secondary | ICD-10-CM | POA: Diagnosis not present

## 2021-03-09 DIAGNOSIS — I5043 Acute on chronic combined systolic (congestive) and diastolic (congestive) heart failure: Secondary | ICD-10-CM | POA: Diagnosis not present

## 2021-03-25 ENCOUNTER — Ambulatory Visit: Payer: No Typology Code available for payment source | Admitting: Cardiology

## 2021-03-25 ENCOUNTER — Other Ambulatory Visit: Payer: Self-pay

## 2021-03-25 ENCOUNTER — Encounter: Payer: Self-pay | Admitting: Cardiology

## 2021-03-25 VITALS — BP 131/78 | HR 80 | Temp 97.8°F | Resp 16 | Ht 67.0 in | Wt 211.0 lb

## 2021-03-25 DIAGNOSIS — Z4502 Encounter for adjustment and management of automatic implantable cardiac defibrillator: Secondary | ICD-10-CM | POA: Diagnosis not present

## 2021-03-25 DIAGNOSIS — I10 Essential (primary) hypertension: Secondary | ICD-10-CM

## 2021-03-25 DIAGNOSIS — I255 Ischemic cardiomyopathy: Secondary | ICD-10-CM

## 2021-03-25 DIAGNOSIS — I2510 Atherosclerotic heart disease of native coronary artery without angina pectoris: Secondary | ICD-10-CM

## 2021-03-25 DIAGNOSIS — Z95810 Presence of automatic (implantable) cardiac defibrillator: Secondary | ICD-10-CM | POA: Diagnosis not present

## 2021-03-25 NOTE — Progress Notes (Signed)
Primary Physician/Referring:  [name], MD  Patient ID: Martin Bush, male    DOB: 1955-02-07, 66 y.o.   MRN: [id number]  Chief Complaint  Patient presents with   Coronary Artery Disease   Hypertension   Follow-up    6 months    HPI:    Martin Bush  is a 66 y.o. Caucasian male with CAD and history of CABG x2 in 2002  with only LIMA to LAD being patent, Circumflex coronary artery is super dominant. History of stenting to circumflex/OM bifurcation due to recurrent restenosis in 2015, left main and circumflex proximal and midsegment in 2016, angioplasty to left main and proximal and mid circumflex in 2020 and again 10/08/2019. He presented with acute pulmonary edema again on 12/11/2019 and repeat cardiac catheterization essentially revealed widely patent circumflex stent.  Past medical history significant for ischemic cardiomyopathy with severe LV systolic dysfunction, recurrent sustained VT SP ICD implantation, hypertension, mixed hyperlipidemia, uncontrolled diabetes mellitus with stage III chronic kidney disease and bilateral carotid artery stenting (left TCAR on 05/26/2020 and a right TCAR on 07/07/2020).  Patient presents today for follow up of CAD, CHF, and carotid stenosis. No new symptoms. No change in chronic dyspnea. No leg edema or chest pain.   Past Medical History:  Diagnosis Date   AICD (automatic cardioverter/defibrillator) present    Medtronic   Arthritis    "thumbs"  (08/02/2017)   CHF (congestive heart failure) (HCC)    Chronic combined systolic and diastolic heart failure ([place])    a. 08/2017 Echo: EF 20-25%, mod glob HK. Sev distal ant sept, inflat HK. Apical AK. Gr2 DD. Mildly reduced RV fxn.   Colon polyps    Coronary artery disease    a. s/p CABG x 2 (LIMA->LAD, VG->OM); b. Multiple PCI's to LM/LCX/OM; c. 07/2017 PTCA of LM/LCX w/ early ISR-->repeat PCI/DES to LM (3.5x20 Synergy DES) and LCX (3.0x20 Synergy DES); d. 07/2018 Relook Cath: LM patent  stent, LAD 100ost, LCX patent stent, OM1 99/60, LIMA->LAD ok. VG->dLCX 53 (old).   Depression    Encounter for assessment of implantable cardioverter-defibrillator (ICD) 09/27/2018   High cholesterol    History of hiatal hernia    Hypertension    ICD; Biventricular  Medtronic ICD Amplia MRI QWuad CRT-D  in situ 10/29/14 10/29/2014   Remote ICD check 09.23.20:  One 6 beat NSVT. No therapy.  1 SVT episode @ 130 bpm (38 Sec).  There were 23 Vent sense episodes detected for up to 1.1 min/day (AT). Health trends (patient activity, heart rate variability, average heart rates) are stable.Trans-thoracic impedance trends and the OptiVol Fluid Index do no present significant abnormalities. Battery longevity is 4.3 years. RA pacing is 3   Ischemic cardiomyopathy 09/27/2018   MI (myocardial infarction) ([place]) 2003   NSTEMI (non-ST elevated myocardial infarction) ([place]) 07/16/2014   OSA on CPAP    Oxygen deficiency    Peripheral vascular disease ([place])    Pneumonia 10/2014   Proteinuria    Sleep apnea    Type II diabetes mellitus (HCC)    insulin dependent   Social History   Tobacco Use   Smoking status: Former    Packs/day: 0.25    Years: 27.00    Pack years: 6.75    Types: Cigars, Cigarettes    Quit date: 2002    Years since quitting: 21.1   Smokeless tobacco: Former    Types: Chew    Quit date: 2002  Substance Use Topics   Alcohol  use: Not Currently    Marital Status: Married ROS  Review of Systems  Cardiovascular:  Positive for dyspnea on exertion. Negative for chest pain and leg swelling.  Objective  Blood pressure 131/78, pulse 80, temperature 97.8 F (36.6 C), temperature source Temporal, resp. rate 16, height '5\' 7"'  (1.702 m), weight 211 lb (95.7 kg), SpO2 95 %.  Vitals with BMI 03/25/2021 12/05/2020 08/11/2020  Height '5\' 7"'  '5\' 7"'  -  Weight 211 lbs 207 lbs -  BMI 32.99 24.26 -  Systolic 834 (No Data) 196  Diastolic 78 (No Data) 75  Pulse 80 - 67     Physical Exam Vitals reviewed.   Constitutional:      Appearance: He is obese.     Comments: He is moderately built and mildly obese in no acute distress.  Neck:     Vascular: No JVD.  Cardiovascular:     Rate and Rhythm: Normal rate and regular rhythm.     Pulses:          Carotid pulses are 1+ on the right side with bruit and 1+ on the left side with bruit.      Femoral pulses are 1+ on the right side and 1+ on the left side.      Popliteal pulses are 1+ on the right side and 1+ on the left side.       Dorsalis pedis pulses are 0 on the right side and 1+ on the left side.       Posterior tibial pulses are 1+ on the right side and 0 on the left side.     Heart sounds: Normal heart sounds.  Pulmonary:     Effort: Pulmonary effort is normal. No accessory muscle usage or respiratory distress.     Breath sounds: Normal breath sounds.  Abdominal:     General: Bowel sounds are normal.     Palpations: Abdomen is soft.  Musculoskeletal:     Right lower leg: No edema.     Left lower leg: No edema.   Laboratory examination:   Recent Labs    05/27/20 0500 07/02/20 1415 07/08/20 0146 08/28/20 1328  NA 130* 132* 128* 138  K 4.6 4.9 4.4 4.9  CL 104 104 100 106  CO2 17* 20* 21* 16*  GLUCOSE 207* 266* 233* 311*  BUN 26* 33* 24* 38*  CREATININE 1.44* 1.79* 1.54* 1.88*  CALCIUM 8.7* 9.0 8.5* 9.3  GFRNONAA 54* 42* 50*  --    CrCl cannot be calculated (Patient's most recent lab result is older than the maximum 21 days allowed.).  CMP Latest Ref Rng & Units 08/28/2020 07/08/2020 07/02/2020  Glucose 65 - 99 mg/dL 311(H) 233(H) 266(H)  BUN 8 - 27 mg/dL 38(H) 24(H) 33(H)  Creatinine 0.76 - 1.27 mg/dL 1.88(H) 1.54(H) 1.79(H)  Sodium 134 - 144 mmol/L 138 128(L) 132(L)  Potassium 3.5 - 5.2 mmol/L 4.9 4.4 4.9  Chloride 96 - 106 mmol/L 106 100 104  CO2 20 - 29 mmol/L 16(L) 21(L) 20(L)  Calcium 8.6 - 10.2 mg/dL 9.3 8.5(L) 9.0  Total Protein 6.5 - 8.1 g/dL - - 7.0  Total Bilirubin 0.3 - 1.2 mg/dL - - 0.6  Alkaline Phos 38 - 126  U/L - - 164(H)  AST 15 - 41 U/L - - 16  ALT 0 - 44 U/L - - 16   CBC Latest Ref Rng & Units 07/08/2020 07/02/2020 05/27/2020  WBC 4.0 - 10.5 K/uL 12.6(H) 8.8 12.5(H)  Hemoglobin 13.0 - 17.0  g/dL 10.0(L) 12.2(L) 11.4(L)  Hematocrit 39.0 - 52.0 % 30.3(L) 38.1(L) 34.2(L)  Platelets 150 - 400 K/uL 180 193 194   Lipid Panel Recent Labs    05/27/20 0500 07/08/20 0146  CHOL 117 101  TRIG 75 65  LDLCALC 60 50  VLDL 15 13  HDL 42 38*  CHOLHDL 2.8 2.7    HEMOGLOBIN A1C Lab Results  Component Value Date   HGBA1C 9.8 (H) 05/23/2020   MPG 234.56 05/23/2020   TSH No results for input(s): TSH in the last 8760 hours.  BNP    Component Value Date/Time   BNP 297.9 (H) 08/28/2020 1328   BNP 910.0 (H) 12/09/2019 0243   External labs:  Labs 01/10/2021:  Hemoglobin 12.4/hematocrit 38.2.  Platelets 198.  Serum creatinine 1.62, BUN 34, sodium 134, potassium 4.5, EGFR 43 mL.  CMP otherwise normal.  A1c 10.6%.  TSH normal.  Total cholesterol 129, triglycerides 140, HDL 46, LDL 55.  Medications and allergies   Allergies  Allergen Reactions   Diltiazem Rash     Current Outpatient Medications:    amiodarone (PACERONE) 200 MG tablet, Take 0.5 tablets (100 mg total) by mouth daily., Disp: 45 tablet, Rfl: 3   aspirin EC 81 MG EC tablet, Take 1 tablet (81 mg total) by mouth daily., Disp:  , Rfl:    carvedilol (COREG) 12.5 MG tablet, Take 12.5 mg by mouth 2 (two) times daily with a meal., Disp: , Rfl:    Cholecalciferol (VITAMIN D3) 50 MCG (2000 UT) TABS, Take 2,000 Units by mouth 2 (two) times daily. , Disp: , Rfl:    Cyanocobalamin 2500 MCG CHEW, Chew 2,500 mcg by mouth daily. , Disp: , Rfl:    empagliflozin (JARDIANCE) 25 MG TABS tablet, Take 1 tablet (25 mg total) by mouth daily before breakfast., Disp: 90 tablet, Rfl: 3   insulin isophane & regular human (NOVOLIN 70/30 FLEXPEN) (70-30) 100 UNIT/ML KwikPen, Inject 30-63 Units into the skin 2 (two) times daily as needed (high blood sugar).,  Disp: , Rfl:    metFORMIN (GLUCOPHAGE-XR) 500 MG 24 hr tablet, Take 500 mg by mouth 2 (two) times daily., Disp: , Rfl:    nitroGLYCERIN (NITROSTAT) 0.4 MG SL tablet, Place 1 tablet (0.4 mg total) under the tongue every 5 (five) minutes x 3 doses as needed for chest pain., Disp: 25 tablet, Rfl: 3   Omega-3 Fatty Acids (FISH OIL) 1000 MG CAPS, Take 2,000 mg by mouth 3 (three) times daily., Disp: , Rfl:    OXYGEN, Inhale 2 L/min into the lungs See admin instructions. 2 L/min at bedtime in conjunction with CPAP , Disp: , Rfl:    PRESCRIPTION MEDICATION, Inhale into the lungs See admin instructions. CPAP- At bedtime, Disp: , Rfl:    rosuvastatin (CRESTOR) 40 MG tablet, Take 1 tablet (40 mg total) by mouth at bedtime., Disp:  , Rfl:    sacubitril-valsartan (ENTRESTO) 97-103 MG, Take 1 tablet by mouth 2 (two) times daily., Disp: 60 tablet, Rfl:    Semaglutide, 1 MG/DOSE, 2 MG/1.5ML SOPN, Inject 1 mg into the skin every Sunday., Disp: , Rfl:    sertraline (ZOLOFT) 50 MG tablet, Take 50 mg by mouth daily., Disp: , Rfl:    ticagrelor (BRILINTA) 90 MG TABS tablet, Take 1 tablet (90 mg total) by mouth 2 (two) times daily., Disp: 60 tablet, Rfl:    torsemide (DEMADEX) 20 MG tablet, Take 1 tablet (20 mg total) by mouth daily as needed (for an overnight weight gain of  3 pounds or more)., Disp: 90 tablet, Rfl: 3    Radiology:   Chest X-Ray 10/09/2019:  FINDINGS: Unchanged cardiomegaly. Worsened bilateral interstitial opacities. Remote median sternotomy with unchanged position of left chest wall 3 lead AICD. IMPRESSION: Cardiomegaly and worsened bilateral interstitial opacities likely indicating pulmonary edema.  Cardiac Studies:   Renal artery duplex 09/02/2017: Right: Abnormal right Resistive Index. No evidence of right renal  artery stenosis. Left: Normal left Resistive Index. No evidence of left renal artery stenosis. Mesenteric: Normal Celiac artery findings. 70 to 99% stenosis in the superior  mesenteric artery.  Left Heart Catheterization 12/11/2019:   LM: Protected. Patent LM/LCx stent (10/09/2019- 4.0 x 18 mm resolute Onyx DES) LAD: Prox 100% occluded. Bypassed by patent LIMA attaching in mid LAD        Mid to distal LAD 50% disease LCxL: Patent LM/LCx stent. OM3 60% stenosis, mid segment balloon angioplasty site widely patent from 10/09/2019. RCA: Small caliber with severe diffuse disease         Dampening of the pressures noted on engagement   No change in coronary anatomy compared to prior cath in 09/2019 Current presentation likely due to acute on chronic HFrEF.  Echocardiogram 12/18/2019:  Poor eco window and wall motion with reduced sensitivity.  Left ventricle cavity is mildly dilated. Dilated cardiomyopathy. Hypokinetic global wall motion. Abnormal septal wall motion due to post-operative coronary artery bypass graft. Indeterminate diastolic filling pattern due to paced rhythm. Moderately depressed LV systolic function with visual EF 30-35%.  Calculated EF 37%. Left atrial cavity is mildly dilated at 4.2 cm. Right ventricle cavity is normal in size. Normal right ventricular function. RV Pacemaker wires visualized. No significant change since 06/15/2019. EF previously 35-40%.  Carotid artery duplex 08/05/2020: Right Carotid: Patent stent with no evidence of restenosis. Left Carotid: Patent stent with no evidence of restenosis. Vertebrals: Bilateral vertebral arteries demonstrate antegrade flow.  Device check: Medtronic CRTD Amplia MRI QWuad CRT-D 10/29/14   Remote CRT-D transmission 03/08/2021: AP 13%, CRT 97%.  Longevity 1 year 10 months.  Lead impedance and thresholds within normal limits.  There were no atrial fibrillation episodes.  Occasional PVCs.  Thoracic impedance is at baseline and does not suggest volume overload state.  EKG:    EKG 03/25/2021: Underlying sinus rhythm at 78 bpm with ventricularly paced rhythm.  Biventricular pacemaker detected.   EKG  04/04/2020: AV paced rhythm.  No further analysis.   No significant change from 10/17/2019: AV paced rhythm at rate of 75 bpm. No further analysis.   Assessment     ICD-10-CM   1. Ischemic cardiomyopathy  I25.5 PCV ECHOCARDIOGRAM COMPLETE    2. Coronary artery disease involving native coronary artery of native heart without angina pectoris  I25.10 EKG 12-Lead    3. Essential hypertension  I10     4. Encounter for assessment of implantable cardioverter-defibrillator (ICD)  Z45.02     5. ICD; Biventricular  Medtronic ICD Amplia MRI QWuad CRT-D  in situ 10/29/14  Z95.810       No orders of the defined types were placed in this encounter.   Medications Discontinued During This Encounter  Medication Reason   potassium chloride SA (KLOR-CON) 20 MEQ tablet Discontinued by provider   Recommendations:   Martin Bush  is a 66 y.o. Caucasian male with CAD and history of CABG x2 in 2002  with only LIMA to LAD being patent, Circumflex coronary artery is super dominant. History of stenting to circumflex/OM bifurcation due to recurrent restenosis in  2015, left main and circumflex proximal and midsegment in 2016, angioplasty to left main and proximal and mid circumflex in 2020 and again 10/08/2019. He presented with acute pulmonary edema again on 12/11/2019 and repeat cardiac catheterization essentially revealed widely patent circumflex stent.  Past medical history significant for ischemic cardiomyopathy with severe LV systolic dysfunction, recurrent sustained VT SP ICD implantation, hypertension, mixed hyperlipidemia, uncontrolled diabetes mellitus with stage III chronic kidney disease and bilateral carotid artery stenting (left TCAR on 05/26/2020 and a right TCAR on 07/07/2020) patent per carotid duplex from 08/05/2020.  Is presently doing well and has not had any significant arrhythmias on ICD transmission and no decompensated heart failure admissions.  He has not had any angina pectoris as well and has  remained careful with regard to diet and weight loss has been maintained.  He is going on a new diet soon and hoping to lose more weight.  No changes in the medications were done today, blood pressure is well controlled, no clinical evidence heart failure, I will see him back in 6 months for follow-up.  I will repeat echocardiogram.   Adrian Prows, MD, [facility] 03/25/2021, 10:01 PM Office: [phone]

## 2021-04-02 ENCOUNTER — Other Ambulatory Visit: Payer: Self-pay

## 2021-04-02 ENCOUNTER — Ambulatory Visit: Payer: No Typology Code available for payment source

## 2021-04-02 DIAGNOSIS — I255 Ischemic cardiomyopathy: Secondary | ICD-10-CM

## 2021-05-26 ENCOUNTER — Encounter (HOSPITAL_COMMUNITY): Payer: No Typology Code available for payment source

## 2021-05-26 ENCOUNTER — Ambulatory Visit: Payer: No Typology Code available for payment source | Admitting: Vascular Surgery

## 2021-05-28 LAB — HEMOGLOBIN A1C: Hemoglobin A1C: 6.5

## 2021-05-28 LAB — HM DIABETES FOOT EXAM: HM Diabetic Foot Exam: BORDERLINE

## 2021-06-08 ENCOUNTER — Telehealth: Payer: Self-pay

## 2021-06-08 NOTE — Telephone Encounter (Signed)
Notified the patient he was on the study medication.   ?

## 2021-09-23 ENCOUNTER — Other Ambulatory Visit: Payer: Self-pay | Admitting: *Deleted

## 2021-09-23 DIAGNOSIS — I6523 Occlusion and stenosis of bilateral carotid arteries: Secondary | ICD-10-CM

## 2021-09-24 ENCOUNTER — Encounter: Payer: Self-pay | Admitting: Cardiology

## 2021-09-24 ENCOUNTER — Ambulatory Visit: Payer: No Typology Code available for payment source | Admitting: Cardiology

## 2021-09-24 VITALS — BP 143/81 | HR 77 | Temp 97.0°F | Resp 16 | Ht 67.0 in | Wt 204.4 lb

## 2021-09-24 DIAGNOSIS — I10 Essential (primary) hypertension: Secondary | ICD-10-CM

## 2021-09-24 DIAGNOSIS — E7800 Pure hypercholesterolemia, unspecified: Secondary | ICD-10-CM

## 2021-09-24 DIAGNOSIS — Z95828 Presence of other vascular implants and grafts: Secondary | ICD-10-CM | POA: Insufficient documentation

## 2021-09-24 DIAGNOSIS — I2510 Atherosclerotic heart disease of native coronary artery without angina pectoris: Secondary | ICD-10-CM

## 2021-09-24 DIAGNOSIS — I255 Ischemic cardiomyopathy: Secondary | ICD-10-CM

## 2021-09-24 DIAGNOSIS — Z95810 Presence of automatic (implantable) cardiac defibrillator: Secondary | ICD-10-CM

## 2021-09-24 MED ORDER — AMLODIPINE BESYLATE 5 MG PO TABS: 5.0000 mg | ORAL_TABLET | Freq: Every day | ORAL | 2 refills | Status: DC

## 2021-09-24 NOTE — Progress Notes (Signed)
Primary Physician/Referring:  [name], MD  Patient ID: Martin Bush, male    DOB: 07/24/1955, 66 y.o.   MRN: [id number]  Chief Complaint  Patient presents with   Coronary Artery Disease   Congestive Heart Failure   Cardiomyopathy   Follow-up    6 months    HPI:    Awad Gladd  is a 66 y.o. Caucasian male with CAD and history of CABG x2 in 2002  with only LIMA to LAD being patent, Circumflex coronary artery is super dominant. History of stenting to circumflex/OM bifurcation due to recurrent restenosis in 2015, left main and circumflex proximal and midsegment in 2016, angioplasty to left main and proximal and mid circumflex in 2020 and again 10/08/2019. He presented with acute pulmonary edema again on 12/11/2019 and repeat cardiac catheterization essentially revealed widely patent circumflex stent.  Past medical history significant for ischemic cardiomyopathy with severe LV systolic dysfunction, recurrent sustained VT SP ICD implantation, hypertension, mixed hyperlipidemia, uncontrolled diabetes mellitus with stage III chronic kidney disease and bilateral carotid artery stenting (left TCAR on 05/26/2020 and a right TCAR on 07/07/2020).  Patient presents today for follow up of CAD, CHF, and carotid stenosis. No new symptoms. No change in chronic dyspnea. No leg edema or chest pain.   Past Medical History:  Diagnosis Date   AICD (automatic cardioverter/defibrillator) present    Medtronic   Arthritis    "thumbs"  (08/02/2017)   CHF (congestive heart failure) (HCC)    Chronic combined systolic and diastolic heart failure ([place])    a. 08/2017 Echo: EF 20-25%, mod glob HK. Sev distal ant sept, inflat HK. Apical AK. Gr2 DD. Mildly reduced RV fxn.   Colon polyps    Coronary artery disease    a. s/p CABG x 2 (LIMA->LAD, VG->OM); b. Multiple PCI's to LM/LCX/OM; c. 07/2017 PTCA of LM/LCX w/ early ISR-->repeat PCI/DES to LM (3.5x20 Synergy DES) and LCX (3.0x20 Synergy DES); d.  07/2018 Relook Cath: LM patent stent, LAD 100ost, LCX patent stent, OM1 99/60, LIMA->LAD ok. VG->dLCX 68 (old).   Depression    Encounter for assessment of implantable cardioverter-defibrillator (ICD) 09/27/2018   High cholesterol    History of hiatal hernia    Hypertension    ICD; Biventricular  Medtronic ICD Amplia MRI QWuad CRT-D  in situ 10/29/14 10/29/2014   Remote ICD check 09.23.20:  One 6 beat NSVT. No therapy.  1 SVT episode @ 130 bpm (38 Sec).  There were 23 Vent sense episodes detected for up to 1.1 min/day (AT). Health trends (patient activity, heart rate variability, average heart rates) are stable.Trans-thoracic impedance trends and the OptiVol Fluid Index do no present significant abnormalities. Battery longevity is 4.3 years. RA pacing is 3   Ischemic cardiomyopathy 09/27/2018   MI (myocardial infarction) ([place]) 2003   NSTEMI (non-ST elevated myocardial infarction) ([place]) 07/16/2014   OSA on CPAP    Oxygen deficiency    Peripheral vascular disease ([place])    Pneumonia 10/2014   Proteinuria    Sleep apnea    Type II diabetes mellitus (HCC)    insulin dependent   Social History   Tobacco Use   Smoking status: Former    Packs/day: 0.25    Years: 27.00    Total pack years: 6.75    Types: Cigars, Cigarettes    Quit date: 2002    Years since quitting: 21.6   Smokeless tobacco: Former    Types: Chew    Quit date: 2002  Substance Use Topics   Alcohol use: Not Currently    Marital Status: Married ROS  Review of Systems  Cardiovascular:  Positive for dyspnea on exertion. Negative for chest pain and leg swelling.   Objective  Blood pressure (!) 143/81, pulse 77, temperature (!) 97 F (36.1 C), temperature source Temporal, resp. rate 16, height _0  (1.702 m), weight 204 lb 6.4 oz (92.7 kg), SpO2 98 %.     09/24/2021    2:05 PM 03/25/2021    2:27 PM 12/05/2020    2:53 PM  Vitals with BMI  Height _1  _2  _3   Weight 204 lbs 6 oz 211 lbs 207 lbs  BMI 32.01 69.62 95.28   Systolic 413 244   Diastolic 81 78   Pulse 77 80      Physical Exam Vitals reviewed.  Constitutional:      Appearance: He is obese.     Comments: He is moderately built and mildly obese in no acute distress.  Neck:     Vascular: No JVD.  Cardiovascular:     Rate and Rhythm: Normal rate and regular rhythm.     Pulses:          Carotid pulses are 1+ on the right side with bruit and 1+ on the left side with bruit.      Femoral pulses are 1+ on the right side and 1+ on the left side.      Popliteal pulses are 1+ on the right side and 1+ on the left side.       Dorsalis pedis pulses are 0 on the right side and 1+ on the left side.       Posterior tibial pulses are 1+ on the right side and 0 on the left side.     Heart sounds: Normal heart sounds.  Pulmonary:     Effort: Pulmonary effort is normal. No accessory muscle usage or respiratory distress.     Breath sounds: Normal breath sounds.  Abdominal:     General: Bowel sounds are normal.     Palpations: Abdomen is soft.  Musculoskeletal:     Right lower leg: No edema.     Left lower leg: No edema.    Laboratory examination:   No results for input(s): "NA", "K", "CL", "CO2", "GLUCOSE", "BUN", "CREATININE", "CALCIUM", "GFRNONAA", "GFRAA" in the last 8760 hours.  CrCl cannot be calculated (Patient's most recent lab result is older than the maximum 21 days allowed.).     Latest Ref Rng & Units 08/28/2020    1:28 PM 07/08/2020    1:46 AM 07/02/2020    2:15 PM  CMP  Glucose 65 - 99 mg/dL 311  233  266   BUN 8 - 27 mg/dL 38  24  33   Creatinine 0.76 - 1.27 mg/dL 1.88  1.54  1.79   Sodium 134 - 144 mmol/L 138  128  132   Potassium 3.5 - 5.2 mmol/L 4.9  4.4  4.9   Chloride 96 - 106 mmol/L 106  100  104   CO2 20 - 29 mmol/L _4 Calcium 8.6 - 10.2 mg/dL 9.3  8.5  9.0   Total Protein 6.5 - 8.1 g/dL   7.0   Total Bilirubin 0.3 - 1.2 mg/dL   0.6   Alkaline Phos 38 - 126 U/L   164   AST 15 - 41 U/L   16   ALT 0 - 44 U/L  16        Latest Ref Rng & Units 07/08/2020    1:46 AM 07/02/2020    2:15 PM 05/27/2020    5:00 AM  CBC  WBC 4.0 - 10.5 K/uL 12.6  8.8  12.5   Hemoglobin 13.0 - 17.0 g/dL 10.0  12.2  11.4   Hematocrit 39.0 - 52.0 % 30.3  38.1  34.2   Platelets 150 - 400 K/uL 180  193  194    Lipid Panel No results for input(s): "CHOL", "TRIG", "[place]", "VLDL", "HDL", "CHOLHDL", "LDLDIRECT" in the last 8760 hours.   HEMOGLOBIN A1C Lab Results  Component Value Date   HGBA1C 6.5 05/28/2021   MPG 234.56 05/23/2020   TSH No results for input(s): "TSH" in the last 8760 hours.  BNP    Component Value Date/Time   BNP 297.9 (H) 08/28/2020 1328   BNP 910.0 (H) 12/09/2019 0243   External labs:  Labs 07/06/2021:  A1c 7.6%.  TSH normal at 1.36.  Serum creatinine 1.580, BUN 29, serum glucose 112 mg, sodium 138, potassium 4.5, EGFR 48 mL.  Total cholesterol 154, triglycerides 211, HDL 52, LDL 88.  LFTs normal.  Labs 01/10/2021:  Hemoglobin 12.4/hematocrit 38.2.  Platelets 198.  Serum creatinine 1.62, BUN 34, sodium 134, potassium 4.5, EGFR 43 mL.  CMP otherwise normal.  A1c 10.6%.  TSH normal.  Total cholesterol 129, triglycerides 140, HDL 46, LDL 55.  Medications and allergies   Allergies  Allergen Reactions   Diltiazem Rash     Current Outpatient Medications:    amiodarone (PACERONE) 200 MG tablet, Take 0.5 tablets (100 mg total) by mouth daily., Disp: 45 tablet, Rfl: 3   amLODipine (NORVASC) 5 MG tablet, Take 1 tablet (5 mg total) by mouth daily., Disp: 30 tablet, Rfl: 2   aspirin EC 81 MG EC tablet, Take 1 tablet (81 mg total) by mouth daily., Disp:  , Rfl:    carvedilol (COREG) 12.5 MG tablet, Take 12.5 mg by mouth 2 (two) times daily with a meal., Disp: , Rfl:    Cholecalciferol (VITAMIN D3) 50 MCG (2000 UT) TABS, Take 2,000 Units by mouth 2 (two) times daily. , Disp: , Rfl:    Cyanocobalamin 2500 MCG CHEW, Chew 2,500 mcg by mouth daily. , Disp: , Rfl:    empagliflozin (JARDIANCE) 25 MG  TABS tablet, Take 1 tablet (25 mg total) by mouth daily before breakfast., Disp: 90 tablet, Rfl: 3   insulin isophane & regular human (NOVOLIN 70/30 FLEXPEN) (70-30) 100 UNIT/ML KwikPen, Inject 30-63 Units into the skin 2 (two) times daily as needed (high blood sugar)., Disp: , Rfl:    metFORMIN (GLUCOPHAGE-XR) 500 MG 24 hr tablet, Take 500 mg by mouth 2 (two) times daily., Disp: , Rfl:    nitroGLYCERIN (NITROSTAT) 0.4 MG SL tablet, Place 1 tablet (0.4 mg total) under the tongue every 5 (five) minutes x 3 doses as needed for chest pain., Disp: 25 tablet, Rfl: 3   Omega-3 Fatty Acids (FISH OIL) 1000 MG CAPS, Take 2,000 mg by mouth 3 (three) times daily., Disp: , Rfl:    OXYGEN, Inhale 2 L/min into the lungs See admin instructions. 2 L/min at bedtime in conjunction with CPAP , Disp: , Rfl:    PRESCRIPTION MEDICATION, Inhale into the lungs See admin instructions. CPAP- At bedtime, Disp: , Rfl:    rosuvastatin (CRESTOR) 40 MG tablet, Take 1 tablet (40 mg total) by mouth at bedtime., Disp:  , Rfl:    sacubitril-valsartan (ENTRESTO)  97-103 MG, Take 1 tablet by mouth 2 (two) times daily., Disp: 60 tablet, Rfl:    Semaglutide, 1 MG/DOSE, 2 MG/1.5ML SOPN, Inject 1 mg into the skin every Sunday., Disp: , Rfl:    sertraline (ZOLOFT) 50 MG tablet, Take 50 mg by mouth daily., Disp: , Rfl:    ticagrelor (BRILINTA) 90 MG TABS tablet, Take 1 tablet (90 mg total) by mouth 2 (two) times daily., Disp: 60 tablet, Rfl:    torsemide (DEMADEX) 20 MG tablet, Take 1 tablet (20 mg total) by mouth daily as needed (for an overnight weight gain of 3 pounds or more)., Disp: 90 tablet, Rfl: 3    Radiology:   Chest X-Ray 10/09/2019:  FINDINGS: Unchanged cardiomegaly. Worsened bilateral interstitial opacities. Remote median sternotomy with unchanged position of left chest wall 3 lead AICD. IMPRESSION: Cardiomegaly and worsened bilateral interstitial opacities likely indicating pulmonary edema.  Cardiac Studies:   Renal  artery duplex 09/02/2017: Right: Abnormal right Resistive Index. No evidence of right renal  artery stenosis. Left: Normal left Resistive Index. No evidence of left renal artery stenosis. Mesenteric: Normal Celiac artery findings. 70 to 99% stenosis in the superior mesenteric artery.  Left Heart Catheterization 12/11/2019:   LM: Protected. Patent LM/LCx stent (10/09/2019- 4.0 x 18 mm resolute Onyx DES) LAD: Prox 100% occluded. Bypassed by patent LIMA attaching in mid LAD        Mid to distal LAD 50% disease LCxL: Patent LM/LCx stent. OM3 60% stenosis, mid segment balloon angioplasty site widely patent from 10/09/2019. RCA: Small caliber with severe diffuse disease         Dampening of the pressures noted on engagement   No change in coronary anatomy compared to prior cath in 09/2019 Current presentation likely due to acute on chronic HFrEF.  Echocardiogram 12/18/2019:  Poor eco window and wall motion with reduced sensitivity.  Left ventricle cavity is mildly dilated. Dilated cardiomyopathy. Hypokinetic global wall motion. Abnormal septal wall motion due to post-operative coronary artery bypass graft. Indeterminate diastolic filling pattern due to paced rhythm. Moderately depressed LV systolic function with visual EF 30-35%.  Calculated EF 37%. Left atrial cavity is mildly dilated at 4.2 cm. Right ventricle cavity is normal in size. Normal right ventricular function. RV Pacemaker wires visualized. No significant change since 06/15/2019. EF previously 35-40%.  Carotid artery duplex 08/05/2020: Right Carotid: Patent stent with no evidence of restenosis. Left Carotid: Patent stent with no evidence of restenosis. Vertebrals: Bilateral vertebral arteries demonstrate antegrade flow.  PCV ECHOCARDIOGRAM COMPLETE 04/02/2021  Moderately depressed LV systolic function with visual EF 35-40%. Left ventricle cavity is dilated. Moderate left ventricular hypertrophy. Hypokinetic global wall motion. Doppler  evidence of grade I (impaired) diastolic dysfunction, elevated LAP. Endocardial wires noted within the right cardiac chambers. No significant valvular heart disease. Compared to study 12/18/2019 no significant change.  Device check: Medtronic CRTD Amplia MRI QWuad CRT-D 10/29/14   Remote CRT-D transmission 08/19/2021: AP 5%, CRT 98%, longevity 1 year and 6 months.  Patient's and thresholds are normal.  2 NSVT episodes brief.  1 SVT/AT for 5 seconds.  Heart failure monitor does not suggest volume overload state.  Normal ICD function.  EKG:    EKG 09/24/2021: Normal sinus rhythm at rate of 74 bpm, biventricular pacemaker detected.  No further analysis.  No change from 03/25/2021.   Assessment     ICD-10-CM   1. Coronary artery disease involving native coronary artery of native heart without angina pectoris  I25.10 EKG 12-Lead  Lipid Panel With LDL/HDL Ratio    Lipoprotein A (LPA)    High sensitivity CRP    2. Ischemic cardiomyopathy  I25.5     3. Primary hypertension  I10 amLODipine (NORVASC) 5 MG tablet    4. Hypercholesteremia  E78.00 Lipid Panel With LDL/HDL Ratio    5. ICD; Biventricular  Medtronic ICD Amplia MRI QWuad CRT-D  in situ 10/29/14  Z95.810     6. Internal carotid artery stent present: left TCAR on 05/26/2020 and a right TCAR on 07/07/2020  Z95.828       Meds ordered this encounter  Medications   amLODipine (NORVASC) 5 MG tablet    Sig: Take 1 tablet (5 mg total) by mouth daily.    Dispense:  30 tablet    Refill:  2   There are no discontinued medications.  Recommendations:   Chase Arnall  is a 66 y.o. Caucasian male with CAD and history of CABG x2 in 2002  with only LIMA to LAD being patent, Circumflex coronary artery is super dominant. History of stenting to circumflex/OM bifurcation due to recurrent restenosis in 2015, left main and circumflex proximal and midsegment in 2016, angioplasty to left main and proximal and mid circumflex in 2020 and again 10/08/2019.  He presented with acute pulmonary edema again on 12/11/2019 and repeat cardiac catheterization essentially revealed widely patent circumflex stent.  Past medical history significant for ischemic cardiomyopathy with severe LV systolic dysfunction, recurrent sustained VT SP ICD implantation, hypertension, mixed hyperlipidemia, uncontrolled diabetes mellitus with stage III chronic kidney disease and bilateral carotid artery stenting (left TCAR on 05/26/2020 and a right TCAR on 07/07/2020) patent per carotid duplex from 08/05/2020.  Is presently doing well and has not had any significant arrhythmias on ICD transmission and no decompensated heart failure admissions.  He has not had any angina pectoris as well and has remained careful with regard to diet and weight loss has been maintained.   His wife was accompanying him today.  Has been on [place] and has lost significant amount of weight and she is also trying to help her husband lose more weight.  I encouraged him to do this.  Fortunately his ICD transmission revealed stable volume status, he has not had any significant arrhythmias, he is also on low-dose amiodarone for slow VT, TSH is normal.  Lungs are clear to auscultation.  His blood pressure is mildly elevated, will add amlodipine 5 mg daily.  Side effects of leg edema.  Discussed with the patient.  I would like to see him back in 6 weeks for follow-up of hypertension.  Upon review, his LDL has risen, previously has always been <50 5-60.  I would like to repeat lipid profile as well prior to his visit with me.   Adrian Prows, MD, [facility] 09/25/2021, 4:06 PM Office: [phone] Fax: [phone] Pager: [phone]

## 2021-10-06 ENCOUNTER — Other Ambulatory Visit: Payer: Self-pay

## 2021-10-06 ENCOUNTER — Encounter: Payer: Self-pay | Admitting: Vascular Surgery

## 2021-10-06 ENCOUNTER — Ambulatory Visit (HOSPITAL_COMMUNITY)
Admission: RE | Admit: 2021-10-06 | Discharge: 2021-10-06 | Disposition: A | Discharge status: Home / Self Care | Payer: No Typology Code available for payment source | Source: Ambulatory Visit | Attending: Vascular Surgery | Admitting: Vascular Surgery

## 2021-10-06 ENCOUNTER — Ambulatory Visit (INDEPENDENT_AMBULATORY_CARE_PROVIDER_SITE_OTHER): Payer: No Typology Code available for payment source | Admitting: Vascular Surgery

## 2021-10-06 VITALS — BP 144/82 | HR 75 | Temp 97.7°F | Resp 16 | Ht 67.0 in | Wt 205.0 lb

## 2021-10-06 DIAGNOSIS — N1831 Chronic kidney disease, stage 3a: Secondary | ICD-10-CM

## 2021-10-06 DIAGNOSIS — I6521 Occlusion and stenosis of right carotid artery: Secondary | ICD-10-CM

## 2021-10-06 DIAGNOSIS — I6522 Occlusion and stenosis of left carotid artery: Secondary | ICD-10-CM | POA: Diagnosis not present

## 2021-10-06 DIAGNOSIS — I5042 Chronic combined systolic (congestive) and diastolic (congestive) heart failure: Secondary | ICD-10-CM

## 2021-10-06 DIAGNOSIS — I6523 Occlusion and stenosis of bilateral carotid arteries: Secondary | ICD-10-CM | POA: Insufficient documentation

## 2021-10-06 MED ORDER — EMPAGLIFLOZIN 25 MG PO TABS: 25.0000 mg | ORAL_TABLET | Freq: Every day | ORAL | 3 refills | Status: DC

## 2021-10-06 NOTE — Progress Notes (Signed)
Patient name: Martin Bush MRN: [id number] DOB: [birth year] Sex: male  REASON FOR CONSULT: Martin Bush after TCAR  HPI: Martin Bush is a 66 y.o. male, with history of diabetes, coronary artery disease status post CABG as well as previous MI with ischemic cardiomyopathy and severe LV dysfunction, multiple previous PCI's, hypertension, hyperlipidemia that presents for 9 month follow-up for carotid Bush.  He was initially seen with bilateral high-grade carotid artery stenosis that was asymptomatic and referred by Dr. Einar Gip.  His left TCAR was on 05/26/20.  Most recently had a right TCAR done on 07/07/2020.  He has had no complications.  Remains neurologically intact.  Remains on aspirin, Brilinta and statin.  No concerns today. Has lost 25 pounds over past 9 months.  Past Medical History:  Diagnosis Date   AICD (automatic cardioverter/defibrillator) present    Medtronic   Arthritis    "thumbs"  (08/02/2017)   CHF (congestive heart failure) (HCC)    Chronic combined systolic and diastolic heart failure ([place])    a. 08/2017 Echo: EF 20-25%, mod glob HK. Sev distal ant sept, inflat HK. Apical AK. Gr2 DD. Mildly reduced RV fxn.   Colon polyps    Coronary artery disease    a. s/p CABG x 2 (LIMA->LAD, VG->OM); b. Multiple PCI's to LM/LCX/OM; c. 07/2017 PTCA of LM/LCX w/ early ISR-->repeat PCI/DES to LM (3.5x20 Synergy DES) and LCX (3.0x20 Synergy DES); d. 07/2018 Relook Cath: LM patent stent, LAD 100ost, LCX patent stent, OM1 99/Martin, LIMA->LAD ok. VG->dLCX 44 (old).   Depression    Encounter for assessment of implantable cardioverter-defibrillator (ICD) 09/27/2018   High cholesterol    History of hiatal hernia    Hypertension    ICD; Biventricular  Medtronic ICD Amplia MRI QWuad CRT-D  in situ 10/29/14 10/29/2014   Remote ICD check 09.23.20:  One 6 beat NSVT. No therapy.  1 SVT episode @ 130 bpm (38 Sec).  There were 23 Vent sense episodes detected for up to 1.1 min/day  (AT). Health trends (patient activity, heart rate variability, average heart rates) are stable.Trans-thoracic impedance trends and the OptiVol Fluid Index do no present significant abnormalities. Battery longevity is 4.3 years. RA pacing is 3   Ischemic cardiomyopathy 09/27/2018   MI (myocardial infarction) ([place]) 2003   NSTEMI (non-ST elevated myocardial infarction) ([place]) 07/16/2014   OSA on CPAP    Oxygen deficiency    Peripheral vascular disease ([place])    Pneumonia 10/2014   Proteinuria    Sleep apnea    Type II diabetes mellitus ([place])    insulin dependent    Past Surgical History:  Procedure Laterality Date   BIOPSY  09/19/2018   Procedure: BIOPSY;  Surgeon: Thornton Park, MD;  Location: [facility];  Service: Gastroenterology;;   CARDIAC CATHETERIZATION N/A 07/16/2014   Procedure: Left Heart Cath and Coronary Angiography;  Surgeon: Adrian Prows, MD;  Location: [place] [facility];  Service: Cardiovascular;  Laterality: N/A;   CARDIAC CATHETERIZATION  07/16/2014   Procedure: Coronary Balloon Angioplasty;  Surgeon: Adrian Prows, MD;  Location: [place] [facility];  Service: Cardiovascular;;   CARDIAC CATHETERIZATION  2003   CARDIAC DEFIBRILLATOR PLACEMENT  2016   CATARACT EXTRACTION Left 2018   CATARACT EXTRACTION W/ INTRAOCULAR LENS IMPLANT Left 03/2014   COLONOSCOPY     COLONOSCOPY WITH PROPOFOL N/A 09/19/2018   Procedure: COLONOSCOPY WITH PROPOFOL;  Surgeon: Thornton Park, MD;  [facility]: [facility];  Service: Gastroenterology;  Laterality: N/A;   CORONARY  ANGIOPLASTY WITH STENT PLACEMENT     "I've got a total of 8 stents in there; mostly doine at [facility]" (08/02/2017)   CORONARY ARTERY BYPASS GRAFT  ~ 2003   "CABG X2"; [facility]   CORONARY ATHERECTOMY N/A 08/16/2017   Procedure: CORONARY ATHERECTOMY;  Surgeon: Nigel Mormon, MD;  Location: [place] [facility];  Service: Cardiovascular;  Laterality: N/A;   CORONARY BALLOON ANGIOPLASTY N/A 08/04/2017   Procedure:  CORONARY BALLOON ANGIOPLASTY;  Surgeon: Adrian Prows, MD;  Location: [place] [facility];  Service: Cardiovascular;  Laterality: N/A;   CORONARY BALLOON ANGIOPLASTY N/A 01/11/2019   Procedure: CORONARY BALLOON ANGIOPLASTY;  Surgeon: Adrian Prows, MD;  Location: [place] [facility];  Service: Cardiovascular;  Laterality: N/A;   CORONARY BALLOON ANGIOPLASTY N/A 10/09/2019   Procedure: CORONARY BALLOON ANGIOPLASTY;  Surgeon: Adrian Prows, MD;  Location: [place] [facility];  Service: Cardiovascular;  Laterality: N/A;   CORONARY STENT INTERVENTION N/A 08/16/2017   Procedure: CORONARY STENT INTERVENTION;  Surgeon: Nigel Mormon, MD;  Location: [place] [facility];  Service: Cardiovascular;  Laterality: N/A;   CORONARY STENT INTERVENTION N/A 10/09/2019   Procedure: CORONARY STENT INTERVENTION;  Surgeon: Adrian Prows, MD;  Location: [place] [facility];  Service: Cardiovascular;  Laterality: N/A;   ELBOW SURGERY Left ?2001   "pinched nerve"   ENDOSCOPIC MUCOSAL RESECTION  12/18/2018   Procedure: ENDOSCOPIC MUCOSAL RESECTION;  Surgeon: Rush Landmark Telford Nab., MD;  Location: [place];  Service: Gastroenterology;;   EYE SURGERY Left    cataract removal   FLEXIBLE SIGMOIDOSCOPY N/A 12/18/2018   Procedure: FLEXIBLE SIGMOIDOSCOPY;  Surgeon: Irving Copas., MD;  Location: [place];  Service: Gastroenterology;  Laterality: N/A;   HEMOSTASIS CLIP PLACEMENT  12/18/2018   Procedure: HEMOSTASIS CLIP PLACEMENT;  Surgeon: Irving Copas., MD;  Location: [place];  Service: Gastroenterology;;   LEFT HEART CATH AND CORONARY ANGIOGRAPHY N/A 01/11/2019   Procedure: LEFT HEART CATH AND CORONARY ANGIOGRAPHY;  Surgeon: Adrian Prows, MD;  Location: [place] [facility];  Service: Cardiovascular;  Laterality: N/A;   LEFT HEART CATH AND CORS/GRAFTS ANGIOGRAPHY N/A 08/04/2017   Procedure: LEFT HEART CATH AND CORS/GRAFTS ANGIOGRAPHY;  Surgeon: Adrian Prows, MD;  Location: [place] [facility];  Service:  Cardiovascular;  Laterality: N/A;   LEFT HEART CATH AND CORS/GRAFTS ANGIOGRAPHY N/A 08/20/2017   Procedure: LEFT HEART CATH AND CORS/GRAFTS ANGIOGRAPHY;  Surgeon: Nigel Mormon, MD;  Location: [place] [facility];  Service: Cardiovascular;  Laterality: N/A;   LEFT HEART CATH AND CORS/GRAFTS ANGIOGRAPHY N/A 01/08/2019   Procedure: LEFT HEART CATH AND CORS/GRAFTS ANGIOGRAPHY;  Surgeon: [place] Hampshire, MD;  Location: [place] [facility];  Service: Cardiovascular;  Laterality: N/A;   LEFT HEART CATH AND CORS/GRAFTS ANGIOGRAPHY N/A 10/09/2019   Procedure: LEFT HEART CATH AND CORS/GRAFTS ANGIOGRAPHY;  Surgeon: Adrian Prows, MD;  Location: [place] [facility];  Service: Cardiovascular;  Laterality: N/A;   LEFT HEART CATH AND CORS/GRAFTS ANGIOGRAPHY N/A 12/11/2019   Procedure: LEFT HEART CATH AND CORS/GRAFTS ANGIOGRAPHY;  Surgeon: Nigel Mormon, MD;  Location: [place] [facility];  Service: Cardiovascular;  Laterality: N/A;   POLYPECTOMY  09/19/2018   Procedure: POLYPECTOMY;  Surgeon: Thornton Park, MD;  [facility];  Service: Gastroenterology;;   [place]ia [place] INJECTION  09/19/2018   Procedure: SUBMUCOSAL INJECTION;  Surgeon: Thornton Park, MD;  [facility];  Service: Gastroenterology;;   SUBMUCOSAL LIFTING INJECTION  12/18/2018   Procedure: SUBMUCOSAL LIFTING INJECTION;  Surgeon: Irving Copas.,  MD;  Location: [place];  Service: Gastroenterology;;   TRANSCAROTID ARTERY REVASCULARIZATION  Left 05/26/2020   Procedure: LEFT TRANSCAROTID ARTERY REVASCULARIZATION;  Surgeon: [name], MD;  Location: [facility];  Service: Vascular;  Laterality: Left;   TRANSCAROTID ARTERY REVASCULARIZATION  Right 07/07/2020   Procedure: RIGHT TRANSCAROTID ARTERY REVASCULARIZATION;  Surgeon: [name], MD;  Location: [facility] OR;  Service: Vascular;  Laterality: Right;   ULTRASOUND GUIDANCE FOR VASCULAR ACCESS Right 05/26/2020   Procedure: ULTRASOUND GUIDANCE FOR  VASCULAR ACCESS;  Surgeon: [name], MD;  Location: [facility] OR;  Service: Vascular;  Laterality: Right;   ULTRASOUND GUIDANCE FOR VASCULAR ACCESS Left 07/07/2020   Procedure: ULTRASOUND GUIDANCE FOR VASCULAR ACCESS;  Surgeon: [name], MD;  Location: [place];  Service: Vascular;  Laterality: Left;    Family History  Problem Relation Age of Onset   Uterine cancer Mother    Lung cancer Mother    Hyperlipidemia Father    Heart disease Father    Hypertension Father    Diabetes Father    Hypertension Sister    Hypertension Brother    Colon cancer Neg Hx     SOCIAL HISTORY: Social History   Socioeconomic History   Marital status: Married    Spouse name: Not on file   Number of children: 3   Years of education: Not on file   Highest education level: Not on file  Occupational History   Not on file  Tobacco Use   Smoking status: Former    Packs/day: 0.25    Years: 27.00    Total pack years: 6.75    Types: Cigars, Cigarettes    Quit date: 2002    Years since quitting: 21.7   Smokeless tobacco: Former    Types: Chew    Quit date: 2002  Vaping Use   Vaping Use: Never used  Substance and Sexual Activity   Alcohol use: Not Currently   Drug use: Never   Sexual activity: Not Currently  Other Topics Concern   Not on file  Social History Narrative   Not on file   Social Determinants of Health   Financial Resource Strain: Low Risk  (12/05/2020)   Overall Financial Resource Strain (CARDIA)    Difficulty of Paying Living Expenses: Not hard at all  Food Insecurity: No Food Insecurity (12/05/2020)   Hunger Vital Sign    Worried About Running Out of Food in the Last Year: Never true    Ran Out of Food in the Last Year: Never true  Transportation Needs: No Transportation Needs (12/05/2020)   PRAPARE - Hydrologist (Medical): No    Lack of Transportation (Non-Medical): No  Physical Activity: Unknown (04/25/2017)   Exercise Vital Sign     Days of Exercise per Week: 0 days    Minutes of Exercise per Session: Not on file  Stress: No Stress Concern Present (12/05/2020)   [place]    Feeling of Stress : Not at all  Social Connections: [place] (12/05/2020)   Social Connection and Isolation Panel [NHANES]    Frequency of Communication with Friends and Family: Twice a week    Frequency of Social Gatherings with Friends and Family: Twice a week    Attends Religious Services: More than 4 times per year    Active Member of [employer] or Organizations: Yes    Attends Archivist Meetings: Never    Marital Status:  Married  Intimate Partner Violence: Not At Risk (12/05/2020)   Humiliation, Afraid, Rape, and Kick questionnaire    Fear of Current or Ex-Partner: No    Emotionally Abused: No    Physically Abused: No    Sexually Abused: No    Allergies  Allergen Reactions   Diltiazem Rash    Current Outpatient Medications  Medication Sig Dispense Refill   amiodarone (PACERONE) 200 MG tablet Take 0.5 tablets (100 mg total) by mouth daily. 45 tablet 3   amLODipine (NORVASC) 5 MG tablet Take 1 tablet (5 mg total) by mouth daily. 30 tablet 2   aspirin EC 81 MG EC tablet Take 1 tablet (81 mg total) by mouth daily.     carvedilol (COREG) 12.5 MG tablet Take 12.5 mg by mouth 2 (two) times daily with a meal.     Cholecalciferol (VITAMIN D3) 50 MCG (2000 UT) TABS Take 2,000 Units by mouth 2 (two) times daily.      Cyanocobalamin 2500 MCG CHEW Chew 2,500 mcg by mouth daily.      empagliflozin (JARDIANCE) 25 MG TABS tablet Take 1 tablet (25 mg total) by mouth daily before breakfast. 90 tablet 3   insulin isophane & regular human (NOVOLIN 70/30 FLEXPEN) (70-30) 100 UNIT/ML KwikPen Inject 30-63 Units into the skin 2 (two) times daily as needed (high blood sugar).     metFORMIN (GLUCOPHAGE-XR) 500 MG 24 hr tablet Take 500 mg by mouth 2 (two) times daily.      nitroGLYCERIN (NITROSTAT) 0.4 MG SL tablet Place 1 tablet (0.4 mg total) under the tongue every 5 (five) minutes x 3 doses as needed for chest pain. 25 tablet 3   Omega-3 Fatty Acids (FISH OIL) 1000 MG CAPS Take 2,000 mg by mouth 3 (three) times daily.     OXYGEN Inhale 2 L/min into the lungs See admin instructions. 2 L/min at bedtime in conjunction with CPAP      PRESCRIPTION MEDICATION Inhale into the lungs See admin instructions. CPAP- At bedtime     rosuvastatin (CRESTOR) 40 MG tablet Take 1 tablet (40 mg total) by mouth at bedtime.     sacubitril-valsartan (ENTRESTO) 97-103 MG Take 1 tablet by mouth 2 (two) times daily. Martin tablet    Semaglutide, 1 MG/DOSE, 2 MG/1.5ML SOPN Inject 1 mg into the skin every Sunday.     sertraline (ZOLOFT) 50 MG tablet Take 50 mg by mouth daily.     ticagrelor (BRILINTA) 90 MG TABS tablet Take 1 tablet (90 mg total) by mouth 2 (two) times daily. Martin tablet    torsemide (DEMADEX) 20 MG tablet Take 1 tablet (20 mg total) by mouth daily as needed (for an overnight weight gain of 3 pounds or more). 90 tablet 3   No current facility-administered medications for this visit.    REVIEW OF SYSTEMS:  '[X]'$  denotes positive finding, '[ ]'$  denotes negative finding Cardiac  Comments:  Chest pain or chest pressure:    Shortness of breath upon exertion:    Short of breath when lying flat:    Irregular heart rhythm:        Vascular    Pain in calf, thigh, or hip brought on by ambulation:    Pain in feet at night that wakes you up from your sleep:     Blood clot in your veins:    Leg swelling:         Pulmonary    Oxygen at home:    Productive cough:  Wheezing:         Neurologic    Sudden weakness in arms or legs:     Sudden numbness in arms or legs:     Sudden onset of difficulty speaking or slurred speech:    Temporary loss of vision in one eye:     Problems with dizziness:         Gastrointestinal    Blood in stool:     Vomited blood:          Genitourinary    Burning when urinating:     Blood in urine:        Psychiatric    Major depression:         Hematologic    Bleeding problems:    Problems with blood clotting too easily:        Skin    Rashes or ulcers:        Constitutional    Fever or chills:      PHYSICAL EXAM: Vitals:   10/06/21 0853 10/06/21 0855  BP: (!) 144/82 (!) 144/82  Pulse: 75 75  Resp: 16   Temp: 97.7 F (36.5 C)   TempSrc: Temporal   SpO2: 94%   Weight: 205 lb (93 kg)   Height: '5\' 7"'$  (1.702 m)     GENERAL: The patient is a well-nourished male, in no acute distress. The vital signs are documented above. CARDIAC: There is a regular rate and rhythm.  VASCULAR:  Bilateral neck incisions c/d/i NEUROLOGIC: No focal weakness or paresthesias are detected.  Cranial nerves II through XII grossly intact   DATA:   Carotid duplex today shows widely patent bilateral carotid stents.  Assessment/Plan:  66 year old male presents for 2-monthfollow-up and carotid artery Bush.  He initially underwent left TCAR on 05/26/2020.  He then underwent right TCAR 07/07/2020.  Both for high-grade asymptomatic stenosis.  Carotid duplex today shows both stents widely patent.  He will remain on aspirin, statin, Brilinta.  Discussed I would see him in 1 year for continued Bush.  That being said he states his cardiologist Dr. GEinar Gipstates he would follow his carotid disease which is fine.  He can let me know if there is any issues in the future.  CMarty Heck MD [facility] GWayzataOffice: [phone]

## 2021-10-26 ENCOUNTER — Ambulatory Visit: Payer: No Typology Code available for payment source | Admitting: Cardiology

## 2021-10-26 ENCOUNTER — Encounter: Payer: Self-pay | Admitting: Cardiology

## 2021-10-26 VITALS — BP 140/70 | HR 74 | Temp 97.6°F | Resp 16 | Ht 67.0 in | Wt 209.4 lb

## 2021-10-26 DIAGNOSIS — E7800 Pure hypercholesterolemia, unspecified: Secondary | ICD-10-CM | POA: Diagnosis not present

## 2021-10-26 DIAGNOSIS — I5042 Chronic combined systolic (congestive) and diastolic (congestive) heart failure: Secondary | ICD-10-CM | POA: Diagnosis not present

## 2021-10-26 DIAGNOSIS — I10 Essential (primary) hypertension: Secondary | ICD-10-CM | POA: Diagnosis not present

## 2021-10-26 DIAGNOSIS — Z95828 Presence of other vascular implants and grafts: Secondary | ICD-10-CM

## 2021-10-26 DIAGNOSIS — Z95810 Presence of automatic (implantable) cardiac defibrillator: Secondary | ICD-10-CM

## 2021-10-26 DIAGNOSIS — I2510 Atherosclerotic heart disease of native coronary artery without angina pectoris: Secondary | ICD-10-CM | POA: Diagnosis not present

## 2021-10-26 MED ORDER — EMPAGLIFLOZIN 25 MG PO TABS: 25.0000 mg | ORAL_TABLET | Freq: Every day | ORAL | 3 refills | Status: DC

## 2021-10-26 MED ORDER — CARVEDILOL 25 MG PO TABS: 25.0000 mg | ORAL_TABLET | Freq: Two times a day (BID) | ORAL | 3 refills | Status: AC

## 2021-10-26 NOTE — Patient Instructions (Signed)
Please have your [facility] physicians to increase the dose of the Ozempic from 1 mg to all the way 2.4 mg every week.  Please call them today.  I have increased the dose of the carvedilol from 12.5 mg twice daily to 25 mg twice daily.  I will see back in 3 months.

## 2021-10-26 NOTE — Progress Notes (Signed)
Primary Physician/Referring:  [name], MD  Patient ID: Martin Bush, male    DOB: 12-14-1955, 66 y.o.   MRN: [id number]  Chief Complaint  Patient presents with   Hypertension   Hyperlipidemia    HPI:    Martin Bush  is a 65 y.o. Caucasian male with CAD and history of CABG x2 in 2002  with only LIMA to LAD being patent, Circumflex coronary artery is super dominant. History of stenting to circumflex/OM bifurcation due to recurrent restenosis in 2015, left main and circumflex proximal and midsegment in 2016, angioplasty to left main and proximal and mid circumflex in 2020 and again 10/08/2019. He presented with acute pulmonary edema again on 12/11/2019 and repeat cardiac catheterization essentially revealed widely patent circumflex stent.  Past medical history significant for ischemic cardiomyopathy with severe LV systolic dysfunction, recurrent sustained VT SP ICD implantation, hypertension, mixed hyperlipidemia, uncontrolled diabetes mellitus with stage III chronic kidney disease and bilateral carotid artery stenting (left TCAR on 05/26/2020 and a right TCAR on 07/07/2020).  Patient presents today for follow up of hypertension and also hyperlipidemia.  Except for mild chronic dyspnea remains asymptomatic.  Tolerating amlodipine that had started on previously for elevated blood pressure.  Past Medical History:  Diagnosis Date   AICD (automatic cardioverter/defibrillator) present    Medtronic   Arthritis    "thumbs"  (08/02/2017)   CHF (congestive heart failure) (HCC)    Chronic combined systolic and diastolic heart failure ([place])    a. 08/2017 Echo: EF 20-25%, mod glob HK. Sev distal ant sept, inflat HK. Apical AK. Gr2 DD. Mildly reduced RV fxn.   Colon polyps    Depression    History of hiatal hernia    ICD; Biventricular  Medtronic ICD Amplia MRI QWuad CRT-D  in situ 10/29/14 10/29/2014   Remote ICD check 09.23.20:  One 6 beat NSVT. No therapy.  1 SVT episode @ 130 bpm  (38 Sec).  There were 23 Vent sense episodes detected for up to 1.1 min/day (AT). Health trends (patient activity, heart rate variability, average heart rates) are stable.Trans-thoracic impedance trends and the OptiVol Fluid Index do no present significant abnormalities. Battery longevity is 4.3 years. RA pacing is 3   Ischemic cardiomyopathy 09/27/2018   MI (myocardial infarction) ([place]) 2003   NSTEMI (non-ST elevated myocardial infarction) ([place]) 07/16/2014   OSA on CPAP    Peripheral vascular disease (HCC)    Proteinuria    Sleep apnea    Social History   Tobacco Use   Smoking status: Former    Packs/day: 0.25    Years: 27.00    Total pack years: 6.75    Types: Cigars, Cigarettes    Quit date: 2002    Years since quitting: 21.7   Smokeless tobacco: Former    Types: Chew    Quit date: 2002  Substance Use Topics   Alcohol use: Not Currently    Marital Status: Married ROS  Review of Systems  Cardiovascular:  Positive for dyspnea on exertion (Stable). Negative for chest pain and leg swelling.   Objective  Blood pressure (!) 140/70, pulse 74, temperature 97.6 F (36.4 C), temperature source Temporal, resp. rate 16, height '5\' 7"'  (1.702 m), weight 209 lb 6.4 oz (95 kg), SpO2 97 %.     10/26/2021    9:32 AM 10/06/2021    8:55 AM 10/06/2021    8:53 AM  Vitals with BMI  Height '5\' 7"'   '5\' 7"'   Weight 209 lbs  6 oz  205 lbs  BMI 78.58  85.0  Systolic 277 412 878  Diastolic 70 82 82  Pulse 74 75 75     Physical Exam Vitals reviewed.  Constitutional:      Appearance: He is obese.     Comments: He is moderately built and mildly obese in no acute distress.  Neck:     Vascular: No JVD.  Cardiovascular:     Rate and Rhythm: Normal rate and regular rhythm.     Pulses:          Carotid pulses are 1+ on the right side with bruit and 1+ on the left side with bruit.      Femoral pulses are 1+ on the right side and 1+ on the left side.      Popliteal pulses are 1+ on the right side and 1+  on the left side.       Dorsalis pedis pulses are 0 on the right side and 1+ on the left side.       Posterior tibial pulses are 1+ on the right side and 0 on the left side.     Heart sounds: Normal heart sounds.  Pulmonary:     Effort: Pulmonary effort is normal. No accessory muscle usage or respiratory distress.     Breath sounds: Normal breath sounds.  Abdominal:     General: Bowel sounds are normal.     Palpations: Abdomen is soft.  Musculoskeletal:     Right lower leg: Edema (2+ ankle) present.     Left lower leg: Edema (2+ ankle pitting) present.    Laboratory examination:   External labs:  Labs 07/06/2021:  A1c 7.6%.  TSH normal at 1.36.  Serum creatinine 1.580, BUN 29, serum glucose 112 mg, sodium 138, potassium 4.5, EGFR 48 mL.  Total cholesterol 154, triglycerides 211, HDL 52, LDL 88.  LFTs normal.   Labs 01/10/2021:  Hemoglobin 12.4/hematocrit 38.2.  Platelets 198.  Serum creatinine 1.62, BUN 34, sodium 134, potassium 4.5, EGFR 43 mL.  CMP otherwise normal.  A1c 10.6%.  TSH normal.  Total cholesterol 129, triglycerides 140, HDL 46, LDL 55.  Medications and allergies   Allergies  Allergen Reactions   Diltiazem Rash     Current Outpatient Medications:    amiodarone (PACERONE) 200 MG tablet, Take 0.5 tablets (100 mg total) by mouth daily., Disp: 45 tablet, Rfl: 3   amLODipine (NORVASC) 5 MG tablet, Take 1 tablet (5 mg total) by mouth daily., Disp: 30 tablet, Rfl: 2   aspirin EC 81 MG EC tablet, Take 1 tablet (81 mg total) by mouth daily., Disp:  , Rfl:    Cholecalciferol (VITAMIN D3) 50 MCG (2000 UT) TABS, Take 2,000 Units by mouth 2 (two) times daily. , Disp: , Rfl:    Cyanocobalamin 2500 MCG CHEW, Chew 2,500 mcg by mouth daily. , Disp: , Rfl:    insulin isophane & regular human (NOVOLIN 70/30 FLEXPEN) (70-30) 100 UNIT/ML KwikPen, Inject 30-63 Units into the skin 2 (two) times daily as needed (high blood sugar)., Disp: , Rfl:    metFORMIN (GLUCOPHAGE-XR)  500 MG 24 hr tablet, Take 500 mg by mouth 2 (two) times daily., Disp: , Rfl:    nitroGLYCERIN (NITROSTAT) 0.4 MG SL tablet, Place 1 tablet (0.4 mg total) under the tongue every 5 (five) minutes x 3 doses as needed for chest pain., Disp: 25 tablet, Rfl: 3   Omega-3 Fatty Acids (FISH OIL) 1000 MG CAPS, Take 2,000 mg  by mouth 3 (three) times daily., Disp: , Rfl:    OXYGEN, Inhale 2 L/min into the lungs See admin instructions. 2 L/min at bedtime in conjunction with CPAP , Disp: , Rfl:    PRESCRIPTION MEDICATION, Inhale into the lungs See admin instructions. CPAP- At bedtime, Disp: , Rfl:    rosuvastatin (CRESTOR) 40 MG tablet, Take 1 tablet (40 mg total) by mouth at bedtime., Disp:  , Rfl:    sacubitril-valsartan (ENTRESTO) 97-103 MG, Take 1 tablet by mouth 2 (two) times daily., Disp: 60 tablet, Rfl:    Semaglutide, 1 MG/DOSE, 2 MG/1.5ML SOPN, Inject 1 mg into the skin every Sunday., Disp: , Rfl:    sertraline (ZOLOFT) 50 MG tablet, Take 50 mg by mouth daily., Disp: , Rfl:    ticagrelor (BRILINTA) 90 MG TABS tablet, Take 1 tablet (90 mg total) by mouth 2 (two) times daily., Disp: 60 tablet, Rfl:    torsemide (DEMADEX) 20 MG tablet, Take 1 tablet (20 mg total) by mouth daily as needed (for an overnight weight gain of 3 pounds or more)., Disp: 90 tablet, Rfl: 3   carvedilol (COREG) 25 MG tablet, Take 1 tablet (25 mg total) by mouth 2 (two) times daily with a meal., Disp: 180 tablet, Rfl: 3   empagliflozin (JARDIANCE) 25 MG TABS tablet, Take 1 tablet (25 mg total) by mouth daily before breakfast., Disp: 90 tablet, Rfl: 3    Radiology:   Chest X-Ray 10/09/2019:  FINDINGS: Unchanged cardiomegaly. Worsened bilateral interstitial opacities. Remote median sternotomy with unchanged position of left chest wall 3 lead AICD. IMPRESSION: Cardiomegaly and worsened bilateral interstitial opacities likely indicating pulmonary edema.  Cardiac Studies:   Renal artery duplex 09/02/2017: Right: Abnormal right  Resistive Index. No evidence of right renal  artery stenosis. Left: Normal left Resistive Index. No evidence of left renal artery stenosis. Mesenteric: Normal Celiac artery findings. 70 to 99% stenosis in the superior mesenteric artery.  Left Heart Catheterization 12/11/2019:   LM: Protected. Patent LM/LCx stent (10/09/2019- 4.0 x 18 mm resolute Onyx DES) LAD: Prox 100% occluded. Bypassed by patent LIMA attaching in mid LAD        Mid to distal LAD 50% disease LCxL: Patent LM/LCx stent. OM3 60% stenosis, mid segment balloon angioplasty site widely patent from 10/09/2019. RCA: Small caliber with severe diffuse disease         Dampening of the pressures noted on engagement   No change in coronary anatomy compared to prior cath in 09/2019 Current presentation likely due to acute on chronic HFrEF.  Echocardiogram 12/18/2019:  Poor eco window and wall motion with reduced sensitivity.  Left ventricle cavity is mildly dilated. Dilated cardiomyopathy. Hypokinetic global wall motion. Abnormal septal wall motion due to post-operative coronary artery bypass graft. Indeterminate diastolic filling pattern due to paced rhythm. Moderately depressed LV systolic function with visual EF 30-35%.  Calculated EF 37%. Left atrial cavity is mildly dilated at 4.2 cm. Right ventricle cavity is normal in size. Normal right ventricular function. RV Pacemaker wires visualized. No significant change since 06/15/2019. EF previously 35-40%.  PCV ECHOCARDIOGRAM COMPLETE 04/02/2021  Moderately depressed LV systolic function with visual EF 35-40%. Left ventricle cavity is dilated. Moderate left ventricular hypertrophy. Hypokinetic global wall motion. Doppler evidence of grade I (impaired) diastolic dysfunction, elevated LAP. Endocardial wires noted within the right cardiac chambers. No significant valvular heart disease. Compared to study 12/18/2019 no significant change.  Carotid artery duplex 10/06/2021: Right Carotid: The  stent appears <50% stenosed.  Left Carotid:  The stent appears <50% stenosed.  Vertebrals:  Bilateral vertebral arteries demonstrate antegrade flow.  Subclavians: Normal flow hemodynamics were seen in bilateral subclavian  arteries.  Device check: Medtronic CRTD Amplia MRI QWuad CRT-D 10/29/14   Remote CRT-D transmission 10/07/2021: AP 8%, CRT 94%, longevity 1 year and 5 months.  Patient's and thresholds are normal. Ventricular sensing episodes show brief SVT episodes. Longest episode 6 min.  Heart failure monitor does not suggest volume overload state.  Normal ICD function.  EKG:    EKG 09/24/2021: Normal sinus rhythm at rate of 74 bpm, biventricular pacemaker detected.  No further analysis.  No change from 03/25/2021.   Assessment     ICD-10-CM   1. Primary hypertension  I10 High sensitivity CRP    2. Hypercholesteremia  E78.00 Lipid Panel With LDL/HDL Ratio    Lipoprotein A (LPA)    3. Coronary artery disease involving native coronary artery of native heart without angina pectoris  I25.10 High sensitivity CRP    carvedilol (COREG) 25 MG tablet    4. Chronic combined systolic and diastolic heart failure (HCC)  I50.42 Pro b natriuretic peptide (BNP)    empagliflozin (JARDIANCE) 25 MG TABS tablet    carvedilol (COREG) 25 MG tablet    5. Presence of internal carotid stent : Bilateral  Z95.828 PCV CAROTID DUPLEX (BILATERAL)    CANCELED: PCV CAROTID DUPLEX (BILATERAL)    6. ICD; Biventricular  Medtronic ICD Amplia MRI QWuad CRT-D  in situ 10/29/14  Z95.810       Meds ordered this encounter  Medications   empagliflozin (JARDIANCE) 25 MG TABS tablet    Sig: Take 1 tablet (25 mg total) by mouth daily before breakfast.    Dispense:  90 tablet    Refill:  3   carvedilol (COREG) 25 MG tablet    Sig: Take 1 tablet (25 mg total) by mouth 2 (two) times daily with a meal.    Dispense:  180 tablet    Refill:  3   Medications Discontinued During This Encounter  Medication Reason    empagliflozin (JARDIANCE) 25 MG TABS tablet Reorder   carvedilol (COREG) 12.5 MG tablet Reorder    Recommendations:   Reynald Woods  is a 66 y.o. Caucasian male with CAD and history of CABG x2 in 2002  with only LIMA to LAD being patent, Circumflex coronary artery is super dominant. History of stenting to circumflex/OM bifurcation due to recurrent restenosis in 2015, left main and circumflex proximal and midsegment in 2016, angioplasty to left main and proximal and mid circumflex in 2020 and again 10/08/2019. He presented with acute pulmonary edema again on 12/11/2019 and repeat cardiac catheterization essentially revealed widely patent circumflex stent.  Past medical history significant for ischemic cardiomyopathy with severe LV systolic dysfunction, recurrent sustained VT SP ICD implantation, hypertension, mixed hyperlipidemia, uncontrolled diabetes mellitus with stage IIIa chronic kidney disease and bilateral carotid artery stenting (left TCAR on 05/26/2020 and a right TCAR on 07/07/2020) patent per carotid duplex on 10/06/2021.  He will need repeat carotid artery duplex in 1 year for surveillance.  He is presently doing well and has not had any significant arrhythmias on ICD transmission and no decompensated heart failure admissions.  He has not had any angina pectoris as well and has remained careful with regard to diet and weight loss has been maintained.     On his last office visit I had added 5 mg amlodipine, he now has 2+ ankle edema.  Blood pressure is still  elevated, he has gained about 2 to 3 pounds in weight, will increase the dose of carvedilol from 12.5 to 25 mg twice daily for both cardiomyopathy and hypertension and heart failure.  He needs lipid profile testing as detailed previously, LDL is very well controlled but now has risen and before making changes to his therapy, will obtain baseline LDL along with LPA and CRP.  I will see him back in 3 months for follow-up.  Weight loss again  discussed with the patient and over the telephone with his wife.    Adrian Prows, MD, [facility] 10/26/2021, 8:16 PM Office: [phone] Fax: [phone] Pager: [phone]

## 2021-10-27 LAB — LIPID PANEL WITH LDL/HDL RATIO
Cholesterol, Total: 137 mg/dL (ref 100–199)
HDL: 48 mg/dL (ref >39)
LDL Chol Calc (NIH): 74 mg/dL (ref 0–99)
LDL/HDL Ratio: 1.5 ratio (ref 0.0–3.6)
Triglycerides: 76 mg/dL (ref 0–149)
VLDL Cholesterol Cal: 15 mg/dL (ref 5–40)

## 2021-10-27 LAB — PRO B NATRIURETIC PEPTIDE: NT-Pro BNP: 1873 pg/mL — ABNORMAL HIGH (ref 0–376)

## 2021-10-27 LAB — LIPOPROTEIN A (LPA): Lipoprotein (a): 131.1 nmol/L — ABNORMAL HIGH (ref <75.0)

## 2021-10-27 LAB — HIGH SENSITIVITY CRP: CRP, High Sensitivity: 3.37 mg/L — ABNORMAL HIGH (ref 0.00–3.00)

## 2021-10-28 NOTE — Progress Notes (Signed)
Please take Torsemide daily for a week, you should notice at least 3 to 4 pound weight loss, once your leg swelling is resolved and his weight is back to baseline, he can go back to taking torsemide once a day.  Your labs and leg swelling appear to suggest elevated fluid level and congestive heart failure.

## 2021-10-29 NOTE — Progress Notes (Signed)
Called pt to inform him about his labs, pt understood, and  understood bout his medications being increase.

## 2021-11-05 ENCOUNTER — Other Ambulatory Visit: Payer: Self-pay

## 2021-11-05 DIAGNOSIS — I5042 Chronic combined systolic (congestive) and diastolic (congestive) heart failure: Secondary | ICD-10-CM

## 2021-11-05 MED ORDER — EMPAGLIFLOZIN 25 MG PO TABS: 25.0000 mg | ORAL_TABLET | Freq: Every day | ORAL | 3 refills | Status: DC

## 2021-11-24 ENCOUNTER — Other Ambulatory Visit: Payer: Self-pay

## 2021-11-24 ENCOUNTER — Emergency Department (HOSPITAL_COMMUNITY): Payer: No Typology Code available for payment source

## 2021-11-24 ENCOUNTER — Observation Stay (HOSPITAL_COMMUNITY): Payer: No Typology Code available for payment source

## 2021-11-24 ENCOUNTER — Encounter (HOSPITAL_COMMUNITY): Payer: Self-pay | Admitting: Internal Medicine

## 2021-11-24 ENCOUNTER — Inpatient Hospital Stay (HOSPITAL_COMMUNITY)
Admission: EM | Admit: 2021-11-24 | Discharge: 2021-11-26 | DRG: 291 | Disposition: A | Discharge status: Home / Self Care | Payer: No Typology Code available for payment source | Attending: Internal Medicine | Admitting: Internal Medicine

## 2021-11-24 DIAGNOSIS — J9601 Acute respiratory failure with hypoxia: Secondary | ICD-10-CM | POA: Diagnosis not present

## 2021-11-24 DIAGNOSIS — I499 Cardiac arrhythmia, unspecified: Secondary | ICD-10-CM | POA: Diagnosis not present

## 2021-11-24 DIAGNOSIS — I2510 Atherosclerotic heart disease of native coronary artery without angina pectoris: Secondary | ICD-10-CM | POA: Diagnosis present

## 2021-11-24 DIAGNOSIS — Z87891 Personal history of nicotine dependence: Secondary | ICD-10-CM | POA: Diagnosis not present

## 2021-11-24 DIAGNOSIS — E1151 Type 2 diabetes mellitus with diabetic peripheral angiopathy without gangrene: Secondary | ICD-10-CM | POA: Diagnosis present

## 2021-11-24 DIAGNOSIS — Z8679 Personal history of other diseases of the circulatory system: Secondary | ICD-10-CM

## 2021-11-24 DIAGNOSIS — I10 Essential (primary) hypertension: Secondary | ICD-10-CM | POA: Diagnosis present

## 2021-11-24 DIAGNOSIS — Z955 Presence of coronary angioplasty implant and graft: Secondary | ICD-10-CM

## 2021-11-24 DIAGNOSIS — Z95810 Presence of automatic (implantable) cardiac defibrillator: Secondary | ICD-10-CM | POA: Diagnosis not present

## 2021-11-24 DIAGNOSIS — R0600 Dyspnea, unspecified: Secondary | ICD-10-CM | POA: Diagnosis not present

## 2021-11-24 DIAGNOSIS — E1169 Type 2 diabetes mellitus with other specified complication: Secondary | ICD-10-CM | POA: Diagnosis present

## 2021-11-24 DIAGNOSIS — Z801 Family history of malignant neoplasm of trachea, bronchus and lung: Secondary | ICD-10-CM

## 2021-11-24 DIAGNOSIS — Z951 Presence of aortocoronary bypass graft: Secondary | ICD-10-CM

## 2021-11-24 DIAGNOSIS — E66811 Obesity, class 1: Secondary | ICD-10-CM | POA: Diagnosis present

## 2021-11-24 DIAGNOSIS — N179 Acute kidney failure, unspecified: Secondary | ICD-10-CM | POA: Diagnosis not present

## 2021-11-24 DIAGNOSIS — E7800 Pure hypercholesterolemia, unspecified: Secondary | ICD-10-CM | POA: Diagnosis present

## 2021-11-24 DIAGNOSIS — Z7985 Long-term (current) use of injectable non-insulin antidiabetic drugs: Secondary | ICD-10-CM

## 2021-11-24 DIAGNOSIS — N1831 Chronic kidney disease, stage 3a: Secondary | ICD-10-CM | POA: Diagnosis present

## 2021-11-24 DIAGNOSIS — I130 Hypertensive heart and chronic kidney disease with heart failure and stage 1 through stage 4 chronic kidney disease, or unspecified chronic kidney disease: Principal | ICD-10-CM | POA: Diagnosis present

## 2021-11-24 DIAGNOSIS — E119 Type 2 diabetes mellitus without complications: Secondary | ICD-10-CM

## 2021-11-24 DIAGNOSIS — N189 Chronic kidney disease, unspecified: Secondary | ICD-10-CM | POA: Diagnosis present

## 2021-11-24 DIAGNOSIS — E871 Hypo-osmolality and hyponatremia: Secondary | ICD-10-CM | POA: Diagnosis present

## 2021-11-24 DIAGNOSIS — Z833 Family history of diabetes mellitus: Secondary | ICD-10-CM

## 2021-11-24 DIAGNOSIS — Z20822 Contact with and (suspected) exposure to covid-19: Secondary | ICD-10-CM | POA: Diagnosis present

## 2021-11-24 DIAGNOSIS — I5042 Chronic combined systolic (congestive) and diastolic (congestive) heart failure: Secondary | ICD-10-CM

## 2021-11-24 DIAGNOSIS — Z9981 Dependence on supplemental oxygen: Secondary | ICD-10-CM

## 2021-11-24 DIAGNOSIS — I5023 Acute on chronic systolic (congestive) heart failure: Secondary | ICD-10-CM | POA: Diagnosis not present

## 2021-11-24 DIAGNOSIS — Z79899 Other long term (current) drug therapy: Secondary | ICD-10-CM | POA: Diagnosis not present

## 2021-11-24 DIAGNOSIS — I5043 Acute on chronic combined systolic (congestive) and diastolic (congestive) heart failure: Secondary | ICD-10-CM | POA: Diagnosis present

## 2021-11-24 DIAGNOSIS — I5021 Acute systolic (congestive) heart failure: Secondary | ICD-10-CM | POA: Insufficient documentation

## 2021-11-24 DIAGNOSIS — Z794 Long term (current) use of insulin: Secondary | ICD-10-CM

## 2021-11-24 DIAGNOSIS — Z7902 Long term (current) use of antithrombotics/antiplatelets: Secondary | ICD-10-CM

## 2021-11-24 DIAGNOSIS — E785 Hyperlipidemia, unspecified: Secondary | ICD-10-CM | POA: Diagnosis not present

## 2021-11-24 DIAGNOSIS — E1122 Type 2 diabetes mellitus with diabetic chronic kidney disease: Secondary | ICD-10-CM | POA: Diagnosis present

## 2021-11-24 DIAGNOSIS — Z7982 Long term (current) use of aspirin: Secondary | ICD-10-CM

## 2021-11-24 DIAGNOSIS — E669 Obesity, unspecified: Secondary | ICD-10-CM | POA: Diagnosis present

## 2021-11-24 DIAGNOSIS — R6889 Other general symptoms and signs: Secondary | ICD-10-CM | POA: Diagnosis not present

## 2021-11-24 DIAGNOSIS — J810 Acute pulmonary edema: Secondary | ICD-10-CM | POA: Diagnosis not present

## 2021-11-24 DIAGNOSIS — I255 Ischemic cardiomyopathy: Secondary | ICD-10-CM

## 2021-11-24 DIAGNOSIS — I252 Old myocardial infarction: Secondary | ICD-10-CM | POA: Diagnosis not present

## 2021-11-24 DIAGNOSIS — N183 Chronic kidney disease, stage 3 unspecified: Secondary | ICD-10-CM | POA: Diagnosis present

## 2021-11-24 DIAGNOSIS — E1165 Type 2 diabetes mellitus with hyperglycemia: Secondary | ICD-10-CM | POA: Diagnosis present

## 2021-11-24 DIAGNOSIS — Z8249 Family history of ischemic heart disease and other diseases of the circulatory system: Secondary | ICD-10-CM | POA: Diagnosis not present

## 2021-11-24 DIAGNOSIS — Z7984 Long term (current) use of oral hypoglycemic drugs: Secondary | ICD-10-CM

## 2021-11-24 DIAGNOSIS — Z743 Need for continuous supervision: Secondary | ICD-10-CM | POA: Diagnosis not present

## 2021-11-24 DIAGNOSIS — Z6832 Body mass index (BMI) 32.0-32.9, adult: Secondary | ICD-10-CM | POA: Diagnosis not present

## 2021-11-24 DIAGNOSIS — N1832 Chronic kidney disease, stage 3b: Secondary | ICD-10-CM | POA: Diagnosis not present

## 2021-11-24 DIAGNOSIS — R0689 Other abnormalities of breathing: Secondary | ICD-10-CM | POA: Diagnosis not present

## 2021-11-24 DIAGNOSIS — Z8049 Family history of malignant neoplasm of other genital organs: Secondary | ICD-10-CM

## 2021-11-24 DIAGNOSIS — N289 Disorder of kidney and ureter, unspecified: Secondary | ICD-10-CM | POA: Diagnosis not present

## 2021-11-24 LAB — COMPREHENSIVE METABOLIC PANEL
ALT: 16 U/L (ref 0–44)
AST: 17 U/L (ref 15–41)
Albumin: 3.2 g/dL — ABNORMAL LOW (ref 3.5–5.0)
Alkaline Phosphatase: 133 U/L — ABNORMAL HIGH (ref 38–126)
Anion gap: 11 (ref 5–15)
BUN: 29 mg/dL — ABNORMAL HIGH (ref 8–23)
CO2: 22 mmol/L (ref 22–32)
Calcium: 8.9 mg/dL (ref 8.9–10.3)
Chloride: 101 mmol/L (ref 98–111)
Creatinine, Ser: 1.39 mg/dL — ABNORMAL HIGH (ref 0.61–1.24)
GFR, Estimated: 56 mL/min — ABNORMAL LOW (ref >60)
Glucose, Bld: 309 mg/dL — ABNORMAL HIGH (ref 70–99)
Potassium: 4.2 mmol/L (ref 3.5–5.1)
Sodium: 134 mmol/L — ABNORMAL LOW (ref 135–145)
Total Bilirubin: 0.8 mg/dL (ref 0.3–1.2)
Total Protein: 6.9 g/dL (ref 6.5–8.1)

## 2021-11-24 LAB — I-STAT CHEM 8, ED
BUN: 29 mg/dL — ABNORMAL HIGH (ref 8–23)
Calcium, Ion: 1.15 mmol/L (ref 1.15–1.40)
Chloride: 104 mmol/L (ref 98–111)
Creatinine, Ser: 1.3 mg/dL — ABNORMAL HIGH (ref 0.61–1.24)
Glucose, Bld: 312 mg/dL — ABNORMAL HIGH (ref 70–99)
HCT: 38 % — ABNORMAL LOW (ref 39.0–52.0)
Hemoglobin: 12.9 g/dL — ABNORMAL LOW (ref 13.0–17.0)
Potassium: 4.3 mmol/L (ref 3.5–5.1)
Sodium: 135 mmol/L (ref 135–145)
TCO2: 23 mmol/L (ref 22–32)

## 2021-11-24 LAB — TROPONIN I (HIGH SENSITIVITY)
Troponin I (High Sensitivity): 23 ng/L — ABNORMAL HIGH (ref <18)
Troponin I (High Sensitivity): 51 ng/L — ABNORMAL HIGH (ref <18)

## 2021-11-24 LAB — CBG MONITORING, ED
Glucose-Capillary: 228 mg/dL — ABNORMAL HIGH (ref 70–99)
Glucose-Capillary: 242 mg/dL — ABNORMAL HIGH (ref 70–99)
Glucose-Capillary: 242 mg/dL — ABNORMAL HIGH (ref 70–99)
Glucose-Capillary: 293 mg/dL — ABNORMAL HIGH (ref 70–99)
Glucose-Capillary: 355 mg/dL — ABNORMAL HIGH (ref 70–99)
Glucose-Capillary: 371 mg/dL — ABNORMAL HIGH (ref 70–99)

## 2021-11-24 LAB — I-STAT VENOUS BLOOD GAS, ED
Acid-base deficit: 4 mmol/L — ABNORMAL HIGH (ref 0.0–2.0)
Bicarbonate: 21.5 mmol/L (ref 20.0–28.0)
Calcium, Ion: 1.15 mmol/L (ref 1.15–1.40)
HCT: 37 % — ABNORMAL LOW (ref 39.0–52.0)
Hemoglobin: 12.6 g/dL — ABNORMAL LOW (ref 13.0–17.0)
O2 Saturation: 83 %
Potassium: 4.3 mmol/L (ref 3.5–5.1)
Sodium: 135 mmol/L (ref 135–145)
TCO2: 23 mmol/L (ref 22–32)
pCO2, Ven: 41.7 mmHg — ABNORMAL LOW (ref 44–60)
pH, Ven: 7.32 (ref 7.25–7.43)
pO2, Ven: 51 mmHg — ABNORMAL HIGH (ref 32–45)

## 2021-11-24 LAB — PROCALCITONIN: Procalcitonin: 0.16 ng/mL

## 2021-11-24 LAB — RESP PANEL BY RT-PCR (FLU A&B, COVID) ARPGX2
Influenza A by PCR: NEGATIVE
Influenza B by PCR: NEGATIVE
SARS Coronavirus 2 by RT PCR: NEGATIVE

## 2021-11-24 LAB — ECHOCARDIOGRAM COMPLETE
Area-P 1/2: 3.91 cm2
Calc EF: 30.8 %
Height: 67 in
S' Lateral: 5.7 cm
Single Plane A2C EF: 35.4 %
Single Plane A4C EF: 30 %
Weight: 3360 oz

## 2021-11-24 LAB — CBC
HCT: 38.8 % — ABNORMAL LOW (ref 39.0–52.0)
Hemoglobin: 12.5 g/dL — ABNORMAL LOW (ref 13.0–17.0)
MCH: 30.9 pg (ref 26.0–34.0)
MCHC: 32.2 g/dL (ref 30.0–36.0)
MCV: 95.8 fL (ref 80.0–100.0)
Platelets: 279 10*3/uL (ref 150–400)
RBC: 4.05 MIL/uL — ABNORMAL LOW (ref 4.22–5.81)
RDW: 12.5 % (ref 11.5–15.5)
WBC: 16.3 10*3/uL — ABNORMAL HIGH (ref 4.0–10.5)
nRBC: 0 % (ref 0.0–0.2)

## 2021-11-24 LAB — BRAIN NATRIURETIC PEPTIDE: B Natriuretic Peptide: 869.1 pg/mL — ABNORMAL HIGH (ref 0.0–100.0)

## 2021-11-24 MED ORDER — NITROGLYCERIN 2 % TD OINT
1.0000 [in_us] | TOPICAL_OINTMENT | Freq: Once | TRANSDERMAL | Status: DC
  Filled 2021-11-24: qty 1

## 2021-11-24 MED ORDER — ACETAMINOPHEN 325 MG PO TABS: 650.0000 mg | ORAL_TABLET | ORAL | Status: DC | PRN

## 2021-11-24 MED ORDER — SERTRALINE HCL 50 MG PO TABS
50.0000 mg | ORAL_TABLET | Freq: Every day | ORAL | Status: DC
  Administered 2021-11-24 – 2021-11-26 (×3): 50 mg via ORAL
  Filled 2021-11-24 (×3): qty 1

## 2021-11-24 MED ORDER — EMPAGLIFLOZIN 25 MG PO TABS
25.0000 mg | ORAL_TABLET | Freq: Every day | ORAL | Status: DC
  Administered 2021-11-24 – 2021-11-26 (×3): 25 mg via ORAL
  Filled 2021-11-24 (×3): qty 1

## 2021-11-24 MED ORDER — SODIUM CHLORIDE 0.9% FLUSH: 3.0000 mL | INTRAVENOUS | Status: DC | PRN

## 2021-11-24 MED ORDER — ONDANSETRON HCL 4 MG/2ML IJ SOLN: 4.0000 mg | Freq: Four times a day (QID) | INTRAMUSCULAR | Status: DC | PRN

## 2021-11-24 MED ORDER — INSULIN ASPART 100 UNIT/ML IJ SOLN
0.0000 [IU] | INTRAMUSCULAR | Status: DC
  Administered 2021-11-24: 9 [IU] via SUBCUTANEOUS
  Administered 2021-11-24 (×3): 3 [IU] via SUBCUTANEOUS
  Administered 2021-11-24: 9 [IU] via SUBCUTANEOUS
  Administered 2021-11-25 (×2): 3 [IU] via SUBCUTANEOUS
  Administered 2021-11-25: 5 [IU] via SUBCUTANEOUS
  Administered 2021-11-25: 2 [IU] via SUBCUTANEOUS

## 2021-11-24 MED ORDER — ROSUVASTATIN CALCIUM 20 MG PO TABS
40.0000 mg | ORAL_TABLET | Freq: Every day | ORAL | Status: DC
  Administered 2021-11-24 – 2021-11-25 (×2): 40 mg via ORAL
  Filled 2021-11-24 (×2): qty 2

## 2021-11-24 MED ORDER — SODIUM CHLORIDE 0.9 % IV SOLN: 250.0000 mL | INTRAVENOUS | Status: DC | PRN

## 2021-11-24 MED ORDER — FUROSEMIDE 10 MG/ML IJ SOLN
40.0000 mg | Freq: Once | INTRAMUSCULAR | Status: AC
  Administered 2021-11-24: 40 mg via INTRAVENOUS
  Filled 2021-11-24: qty 4

## 2021-11-24 MED ORDER — AMLODIPINE BESYLATE 5 MG PO TABS
5.0000 mg | ORAL_TABLET | Freq: Every day | ORAL | Status: DC
  Administered 2021-11-24 – 2021-11-26 (×3): 5 mg via ORAL
  Filled 2021-11-24 (×3): qty 1

## 2021-11-24 MED ORDER — ASPIRIN 81 MG PO TBEC
81.0000 mg | DELAYED_RELEASE_TABLET | Freq: Every day | ORAL | Status: DC
  Administered 2021-11-24 – 2021-11-26 (×3): 81 mg via ORAL
  Filled 2021-11-24 (×3): qty 1

## 2021-11-24 MED ORDER — TICAGRELOR 90 MG PO TABS
90.0000 mg | ORAL_TABLET | Freq: Two times a day (BID) | ORAL | Status: DC
  Administered 2021-11-24 – 2021-11-26 (×5): 90 mg via ORAL
  Filled 2021-11-24 (×5): qty 1

## 2021-11-24 MED ORDER — SACUBITRIL-VALSARTAN 97-103 MG PO TABS
1.0000 | ORAL_TABLET | Freq: Two times a day (BID) | ORAL | Status: DC
  Administered 2021-11-24 (×2): 1 via ORAL
  Filled 2021-11-24 (×3): qty 1

## 2021-11-24 MED ORDER — FUROSEMIDE 10 MG/ML IJ SOLN
40.0000 mg | Freq: Two times a day (BID) | INTRAMUSCULAR | Status: DC
  Administered 2021-11-24: 40 mg via INTRAVENOUS
  Filled 2021-11-24: qty 4

## 2021-11-24 MED ORDER — FERROUS SULFATE 325 (65 FE) MG PO TABS
325.0000 mg | ORAL_TABLET | Freq: Every day | ORAL | Status: DC
  Administered 2021-11-24 – 2021-11-26 (×3): 325 mg via ORAL
  Filled 2021-11-24 (×3): qty 1

## 2021-11-24 MED ORDER — INSULIN DETEMIR 100 UNIT/ML ~~LOC~~ SOLN
10.0000 [IU] | Freq: Two times a day (BID) | SUBCUTANEOUS | Status: DC
  Administered 2021-11-24 – 2021-11-26 (×5): 10 [IU] via SUBCUTANEOUS
  Filled 2021-11-24 (×6): qty 0.1

## 2021-11-24 MED ORDER — CARVEDILOL 25 MG PO TABS
25.0000 mg | ORAL_TABLET | Freq: Two times a day (BID) | ORAL | Status: DC
  Administered 2021-11-24 – 2021-11-26 (×4): 25 mg via ORAL
  Filled 2021-11-24: qty 1
  Filled 2021-11-24: qty 2
  Filled 2021-11-24: qty 1
  Filled 2021-11-24: qty 2

## 2021-11-24 MED ORDER — ENOXAPARIN SODIUM 40 MG/0.4ML IJ SOSY
40.0000 mg | PREFILLED_SYRINGE | INTRAMUSCULAR | Status: DC
  Administered 2021-11-24 – 2021-11-26 (×3): 40 mg via SUBCUTANEOUS
  Filled 2021-11-24 (×3): qty 0.4

## 2021-11-24 MED ORDER — SODIUM CHLORIDE 0.9% FLUSH
3.0000 mL | Freq: Two times a day (BID) | INTRAVENOUS | Status: DC
  Administered 2021-11-25 – 2021-11-26 (×3): 3 mL via INTRAVENOUS

## 2021-11-24 MED ORDER — AMIODARONE HCL 200 MG PO TABS
100.0000 mg | ORAL_TABLET | Freq: Every day | ORAL | Status: DC
  Administered 2021-11-24 – 2021-11-26 (×3): 100 mg via ORAL
  Filled 2021-11-24 (×3): qty 1

## 2021-11-24 MED ORDER — PERFLUTREN LIPID MICROSPHERE
1.0000 mL | INTRAVENOUS | Status: AC | PRN
  Administered 2021-11-24: 4 mL via INTRAVENOUS

## 2021-11-24 NOTE — Assessment & Plan Note (Addendum)
Patient was placed on insulin sliding scale and 10 units of basal insulin with adequate glucose control.  At the time of discharge he will resume his home insulin regimen.  Continue with statin therapy.

## 2021-11-24 NOTE — Progress Notes (Addendum)
Patient seen and examined, admitted earlier this morning by Dr. Alcario Drought, please see his H&P for details briefly Martin Bush is a 65/M with history of chronic systolic CHF, EF 40%, ischemic cardiomyopathy, CAD/CABG, AICD presented to the ED with worsening dyspnea on exertion X 1 to 2 days, compliant with diuretics and diet for the most part. -In the ED his BNP was 869, troponin 23, 51, creatinine of 1.3, chest x-ray with pulmonary edema and bilateral effusions  Acute hypoxic respiratory failure Acute on chronic systolic CHF -Last echo this year with a EF of 40-45% -Continue IV Lasix, Entresto, Jardiance, Coreg -no ACS, Follow-up repeat echo -Monitor kidney function -DC BiPAP, resume diet, resume CPAP nightly  Rest as noted in H&P by Dr. Alcario Drought this morning  Domenic Polite, MD

## 2021-11-24 NOTE — ED Notes (Signed)
Verbal report given to Joie Bimler RN at this time

## 2021-11-24 NOTE — ED Triage Notes (Signed)
BIB EMS for increased SOB since yesterday morning. RA 80%, remains on 80%s on CPAP on arrival.

## 2021-11-24 NOTE — Assessment & Plan Note (Signed)
S/p AICD Cont amiodarone

## 2021-11-24 NOTE — ED Notes (Signed)
Hospitalist at bedside 

## 2021-11-24 NOTE — Progress Notes (Signed)
Heart Failure Navigator Progress Note  Assessed for Heart & Vascular TOC clinic readiness.  Patient does not meet criteria due to [facility] patient.Earnestine Leys, BSN, RN Heart Failure Transport planner Only

## 2021-11-24 NOTE — H&P (Signed)
History and Physical    Patient: Martin Bush ZOX:[id number] DOB: 1955/07/25 DOA: 11/24/2021 DOS: the patient was seen and examined on 11/24/2021 PCP: [facility]  Patient coming from: Home  Chief Complaint:  Chief Complaint  Patient presents with   Shortness of Breath   HPI: Martin Bush is a 66 y.o. male with medical history significant of HFrEF, ICM, CAD s/p CABG.  AICD placement.  Pt woke up with PND tonight.  EMS called, reports hypoxic and tachypnic on arrival.  Put on NIPPV, brought in to ED.  Pt hypertensive initially.  Pt given lasix.  BP and breathing improved with lasix and BIPAP.  Denies any CP.  Family member has been ill with URI symptoms recently but he has been staying away from that family member.  No known fever, though felt hot throughout day.  Does endorse PND, orthopnea, and DOE especially recently.   Review of Systems: As mentioned in the history of present illness. All other systems reviewed and are negative. Past Medical History:  Diagnosis Date   AICD (automatic cardioverter/defibrillator) present    Medtronic   Arthritis    "thumbs"  (08/02/2017)   CHF (congestive heart failure) (HCC)    Chronic combined systolic and diastolic heart failure ([place])    a. 08/2017 Echo: EF 20-25%, mod glob HK. Sev distal ant sept, inflat HK. Apical AK. Gr2 DD. Mildly reduced RV fxn.   Colon polyps    Depression    History of hiatal hernia    ICD; Biventricular  Medtronic ICD Amplia MRI QWuad CRT-D  in situ 10/29/14 10/29/2014   Remote ICD check 09.23.20:  One 6 beat NSVT. No therapy.  1 SVT episode @ 130 bpm (38 Sec).  There were 23 Vent sense episodes detected for up to 1.1 min/day (AT). Health trends (patient activity, heart rate variability, average heart rates) are stable.Trans-thoracic impedance trends and the OptiVol Fluid Index do no present significant abnormalities. Battery longevity is 4.3 years. RA pacing is 3   Ischemic  cardiomyopathy 09/27/2018   MI (myocardial infarction) [facility]) 2003   NSTEMI (non-ST elevated myocardial infarction) ([place]) 07/16/2014   OSA on CPAP    Peripheral vascular disease ([place])    Proteinuria    Sleep apnea    Past Surgical History:  Procedure Laterality Date   BIOPSY  09/19/2018   Procedure: BIOPSY;  Surgeon: Thornton Park, MD;  Location: [facility];  Service: Gastroenterology;;   CARDIAC CATHETERIZATION N/A 07/16/2014   Procedure: Left Heart Cath and Coronary Angiography;  Surgeon: Adrian Prows, MD;  Location: [place] [facility];  Service: Cardiovascular;  Laterality: N/A;   CARDIAC CATHETERIZATION  07/16/2014   Procedure: Coronary Balloon Angioplasty;  Surgeon: Adrian Prows, MD;  Location: [place] [facility];  Service: Cardiovascular;;   CARDIAC CATHETERIZATION  2003   CARDIAC DEFIBRILLATOR PLACEMENT  2016   CATARACT EXTRACTION Left 2018   CATARACT EXTRACTION W/ INTRAOCULAR LENS IMPLANT Left 03/2014   COLONOSCOPY     COLONOSCOPY WITH PROPOFOL N/A 09/19/2018   Procedure: COLONOSCOPY WITH PROPOFOL;  Surgeon: Thornton Park, MD;  Location: [facility];  Service: Gastroenterology;  Laterality: N/A;   CORONARY ANGIOPLASTY WITH STENT PLACEMENT     "I've got a total of 8 stents in there; mostly doine at [facility]" (08/02/2017)   CORONARY ARTERY BYPASS GRAFT  ~ 2003   "CABG X2"; [facility]   CORONARY ATHERECTOMY N/A 08/16/2017   Procedure: CORONARY ATHERECTOMY;  Surgeon: Nigel Mormon, MD;  Location: [place]  CV [facility];  Service: Cardiovascular;  Laterality: N/A;   CORONARY BALLOON ANGIOPLASTY N/A 08/04/2017   Procedure: CORONARY BALLOON ANGIOPLASTY;  Surgeon: Adrian Prows, MD;  Location: [place] [facility];  Service: Cardiovascular;  Laterality: N/A;   CORONARY BALLOON ANGIOPLASTY N/A 01/11/2019   Procedure: CORONARY BALLOON ANGIOPLASTY;  Surgeon: Adrian Prows, MD;  Location: [place] [facility];  Service: Cardiovascular;  Laterality: N/A;   CORONARY BALLOON ANGIOPLASTY N/A  10/09/2019   Procedure: CORONARY BALLOON ANGIOPLASTY;  Surgeon: Adrian Prows, MD;  Location: [place] [facility];  Service: Cardiovascular;  Laterality: N/A;   CORONARY STENT INTERVENTION N/A 08/16/2017   Procedure: CORONARY STENT INTERVENTION;  Surgeon: Nigel Mormon, MD;  Location: [place] [facility];  Service: Cardiovascular;  Laterality: N/A;   CORONARY STENT INTERVENTION N/A 10/09/2019   Procedure: CORONARY STENT INTERVENTION;  Surgeon: Adrian Prows, MD;  Location: [place] [facility];  Service: Cardiovascular;  Laterality: N/A;   ELBOW SURGERY Left ?2001   "pinched nerve"   ENDOSCOPIC MUCOSAL RESECTION  12/18/2018   Procedure: ENDOSCOPIC MUCOSAL RESECTION;  Surgeon: Rush Landmark Telford Nab., MD;  Location: [place];  Service: Gastroenterology;;   EYE SURGERY Left    cataract removal   FLEXIBLE SIGMOIDOSCOPY N/A 12/18/2018   Procedure: FLEXIBLE SIGMOIDOSCOPY;  Surgeon: Irving Copas., MD;  Location: [place];  Service: Gastroenterology;  Laterality: N/A;   HEMOSTASIS CLIP PLACEMENT  12/18/2018   Procedure: HEMOSTASIS CLIP PLACEMENT;  Surgeon: Irving Copas., MD;  Location: [place];  Service: Gastroenterology;;   LEFT HEART CATH AND CORONARY ANGIOGRAPHY N/A 01/11/2019   Procedure: LEFT HEART CATH AND CORONARY ANGIOGRAPHY;  Surgeon: Adrian Prows, MD;  Location: [place] [facility];  Service: Cardiovascular;  Laterality: N/A;   LEFT HEART CATH AND CORS/GRAFTS ANGIOGRAPHY N/A 08/04/2017   Procedure: LEFT HEART CATH AND CORS/GRAFTS ANGIOGRAPHY;  Surgeon: Adrian Prows, MD;  Location: [place] [facility];  Service: Cardiovascular;  Laterality: N/A;   LEFT HEART CATH AND CORS/GRAFTS ANGIOGRAPHY N/A 08/20/2017   Procedure: LEFT HEART CATH AND CORS/GRAFTS ANGIOGRAPHY;  Surgeon: Nigel Mormon, MD;  Location: [place] [facility];  Service: Cardiovascular;  Laterality: N/A;   LEFT HEART CATH AND CORS/GRAFTS ANGIOGRAPHY N/A 01/08/2019   Procedure: LEFT HEART CATH AND  CORS/GRAFTS ANGIOGRAPHY;  Surgeon: Wellington Hampshire, MD;  Location: [place] [facility];  Service: Cardiovascular;  Laterality: N/A;   LEFT HEART CATH AND CORS/GRAFTS ANGIOGRAPHY N/A 10/09/2019   Procedure: LEFT HEART CATH AND CORS/GRAFTS ANGIOGRAPHY;  Surgeon: Adrian Prows, MD;  Location: [place] [facility];  Service: Cardiovascular;  Laterality: N/A;   LEFT HEART CATH AND CORS/GRAFTS ANGIOGRAPHY N/A 12/11/2019   Procedure: LEFT HEART CATH AND CORS/GRAFTS ANGIOGRAPHY;  Surgeon: Nigel Mormon, MD;  Location: [place] [facility];  Service: Cardiovascular;  Laterality: N/A;   POLYPECTOMY  09/19/2018   Procedure: POLYPECTOMY;  Surgeon: Thornton Park, MD;  [facility];  Service: Gastroenterology;;   Lia Foyer INJECTION  09/19/2018   Procedure: SUBMUCOSAL INJECTION;  Surgeon: Thornton Park, MD;  [facility];  Service: Gastroenterology;;   Lia Foyer LIFTING INJECTION  12/18/2018   Procedure: SUBMUCOSAL LIFTING INJECTION;  Surgeon: Irving Copas., MD;  Location: [facility] ENDOSCOPY;  Service: Gastroenterology;;   TRANSCAROTID ARTERY REVASCULARIZATION  Left 05/26/2020   Procedure: LEFT TRANSCAROTID ARTERY REVASCULARIZATION;  Surgeon: [name], MD;  Location: [place];  Service: Vascular;  Laterality: Left;   TRANSCAROTID ARTERY REVASCULARIZATION  Right 07/07/2020   Procedure: RIGHT TRANSCAROTID ARTERY REVASCULARIZATION;  Surgeon: [name],  MD;  Location: [facility] OR;  Service: Vascular;  Laterality: Right;   ULTRASOUND GUIDANCE FOR VASCULAR ACCESS Right 05/26/2020   Procedure: ULTRASOUND GUIDANCE FOR VASCULAR ACCESS;  Surgeon: [name], MD;  [facility];  Service: Vascular;  Laterality: Right;   ULTRASOUND GUIDANCE FOR VASCULAR ACCESS Left 07/07/2020   Procedure: ULTRASOUND GUIDANCE FOR VASCULAR ACCESS;  Surgeon: [name], MD;  Location: [facility];  Service: Vascular;  Laterality: Left;   Social History:  reports that he quit smoking  about 21 years ago. His smoking use included cigars and cigarettes. He has a 6.75 pack-year smoking history. He quit smokeless tobacco use about 21 years ago.  His smokeless tobacco use included chew. He reports that he does not currently use alcohol. He reports that he does not use drugs.  Allergies  Allergen Reactions   Diltiazem Rash    Family History  Problem Relation Age of Onset   Uterine cancer Mother    Lung cancer Mother    Hyperlipidemia Father    Heart disease Father    Hypertension Father    Diabetes Father    Hypertension Sister    Hypertension Brother    Colon cancer Neg Hx     Prior to Admission medications   Medication Sig Start Date End Date Taking? Authorizing Provider  amiodarone (PACERONE) 200 MG tablet Take 0.5 tablets (100 mg total) by mouth daily. 10/07/20  Yes Adrian Prows, MD  amLODipine (NORVASC) 5 MG tablet Take 1 tablet (5 mg total) by mouth daily. 09/24/21 12/23/21 Yes Adrian Prows, MD  aspirin EC 81 MG EC tablet Take 1 tablet (81 mg total) by mouth daily. 01/08/19  Yes Nolberto Hanlon, MD  carvedilol (COREG) 25 MG tablet Take 1 tablet (25 mg total) by mouth 2 (two) times daily with a meal. 10/26/21  Yes Adrian Prows, MD  Cholecalciferol (VITAMIN D3) 50 MCG (2000 UT) TABS Take 2,000 Units by mouth 2 (two) times daily.    Yes [provider]  Cyanocobalamin 2500 MCG CHEW Chew 2,500 mcg by mouth daily.    Yes [provider]  ferrous sulfate 325 (65 FE) MG tablet Take 325 mg by mouth daily. 11/06/21  Yes [provider]  insulin isophane & regular human (NOVOLIN 70/30 FLEXPEN) (70-30) 100 UNIT/ML KwikPen Inject 30-63 Units into the skin 2 (two) times daily as needed (high blood sugar).   Yes [provider]  metFORMIN (GLUCOPHAGE-XR) 500 MG 24 hr tablet Take 500 mg by mouth 2 (two) times daily.   Yes [provider]  Omega-3 Fatty Acids (FISH OIL) 1000 MG CAPS Take 2,000 mg by mouth 3 (three) times daily.   Yes [provider]  OXYGEN Inhale 2 L/min into the lungs See admin instructions. 2 L/min at bedtime in conjunction with CPAP    Yes [provider]  PRESCRIPTION MEDICATION Inhale into the lungs See admin instructions. CPAP- At bedtime   Yes [provider]  rosuvastatin (CRESTOR) 40 MG tablet Take 1 tablet (40 mg total) by mouth at bedtime. 01/08/19  Yes Nolberto Hanlon, MD  sacubitril-valsartan (ENTRESTO) 97-103 MG Take 1 tablet by mouth 2 (two) times daily. 03/13/20  Yes Adrian Prows, MD  Semaglutide, 1 MG/DOSE, 2 MG/1.5ML SOPN Inject 1 mg into the skin every Sunday. 03/27/20  Yes [provider]  sertraline (ZOLOFT) 50 MG tablet Take 50 mg by mouth daily.   Yes [provider]  ticagrelor (BRILINTA) 90 MG TABS tablet Take 1 tablet (90  mg total) by mouth 2 (two) times daily. 03/13/20  Yes Adrian Prows, MD  torsemide (DEMADEX) 20 MG tablet Take 1 tablet (20 mg total) by mouth daily as needed (for an overnight weight gain of 3 pounds or more). 04/04/20  Yes Adrian Prows, MD  empagliflozin (JARDIANCE) 25 MG TABS tablet Take 1 tablet (25 mg total) by mouth daily before breakfast. 11/05/21   Adrian Prows, MD  glucose 4 GM chewable tablet CHEW FOUR TABLETS BY MOUTH DAILY AS NEEDED FOR LOW BLOOD SUGAR (REPEAT EVERY 15 MINUTES IF BLOOD SUGAR LESS THAN 70) Patient not taking: Reported on 11/24/2021 11/05/21   [provider]  nitroGLYCERIN (NITROSTAT) 0.4 MG SL tablet Place 1 tablet (0.4 mg total) under the tongue every 5 (five) minutes x 3 doses as needed for chest pain. Patient not taking: Reported on 11/24/2021 05/19/20   Adrian Prows, MD    Physical Exam: Vitals:   11/24/21 0328 11/24/21 0330 11/24/21 0345 11/24/21 0400  BP:  (!) 120/59 (!) 147/84 (!) 149/86  Pulse: 77 77 78 77  Resp: '19 19 20 19  '$ Temp:      TempSrc:      SpO2: 100% 96% 98% 97%  Weight:      Height:       Constitutional: Ill appearing Eyes: PERRL, lids and conjunctivae normal ENMT: Mucous membranes  are moist. Posterior pharynx clear of any exudate or lesions.Normal dentition.  Neck: normal, supple, no masses, no thyromegaly Respiratory: B wheezing Cardiovascular: Regular rate and rhythm, no murmurs / rubs / gallops. No extremity edema. 2+ pedal pulses. No carotid bruits.  Abdomen: no tenderness, no masses palpated. No hepatosplenomegaly. Bowel sounds positive.  Musculoskeletal: no clubbing / cyanosis. No joint deformity upper and lower extremities. Good ROM, no contractures. Normal muscle tone.  Skin: no rashes, lesions, ulcers. No induration Neurologic: CN 2-12 grossly intact. Sensation intact, DTR normal. Strength 5/5 in all 4.  Psychiatric: Normal judgment and insight. Alert and oriented x 3. Normal mood.   Data Reviewed:    COVID and flu neg     Latest Ref Rng & Units 11/24/2021    2:16 AM 11/24/2021    2:10 AM 07/08/2020    1:46 AM  CBC  WBC 4.0 - 10.5 K/uL  16.3  12.6   Hemoglobin 13.0 - 17.0 g/dL 13.0 - 17.0 g/dL 12.9    12.6  12.5  10.0   Hematocrit 39.0 - 52.0 % 39.0 - 52.0 % 38.0    37.0  38.8  30.3   Platelets 150 - 400 K/uL  279  180       Latest Ref Rng & Units 11/24/2021    2:16 AM 11/24/2021    2:10 AM 08/28/2020    1:28 PM  CMP  Glucose 70 - 99 mg/dL 312  309  311   BUN 8 - 23 mg/dL 29  29  38   Creatinine 0.61 - 1.24 mg/dL 1.30  1.39  1.88   Sodium 135 - 145 mmol/L 135 - 145 mmol/L 135    135  134  138   Potassium 3.5 - 5.1 mmol/L 3.5 - 5.1 mmol/L 4.3    4.3  4.2  4.9   Chloride 98 - 111 mmol/L 104  101  106   CO2 22 - 32 mmol/L  22  16   Calcium 8.9 - 10.3 mg/dL  8.9  9.3   Total Protein 6.5 - 8.1 g/dL  6.9    Total Bilirubin 0.3 -  1.2 mg/dL  0.8    Alkaline Phos 38 - 126 U/L  133    AST 15 - 41 U/L  17    ALT 0 - 44 U/L  16     CXR = IMPRESSION: 1. Cardiomegaly with perihilar vascular congestion and moderate central edema. Progress chest films recommended depending on clinical response. 2. Asymmetric patchy consolidation in the left  lower lung which could be asymmetric airspace edema or pneumonia. 3. Small pleural effusions. 4. Previous CABG changes and coronary artery stent.  BNP 869  Trops 23 and 51  Assessment and Plan: * Acute on chronic HFrEF (heart failure with reduced ejection fraction) (HCC) Due to ICM. EF 40-45% on Echo earlier this year. Possibly superimposed PNA but CHF felt more likely. CHF pathway Tele monitor Pt improved on BIPAP Lasix '40mg'$  IV in ED then '40mg'$  IV BID Repeating 2d echo Cont entresto, coreg Procalcitonin pending Monitor for fever  Ischemic cardiomyopathy Cont ASA, Statin, Coreg  CKD (chronic kidney disease) stage 3, GFR 30-59 ml/min (HCC) Monitor creat with diuresis.  History of ventricular tachycardia S/p AICD Cont amiodarone  Insulin dependent type 2 diabetes mellitus (HCC) Sensitive SSI Q4H for the moment      Advance Care Planning:   Code Status: Full Code  Consults: None  Family Communication: Family at bedside  Severity of Illness: The appropriate patient status for this patient is OBSERVATION. Observation status is judged to be reasonable and necessary in order to provide the required intensity of service to ensure the patient's safety. The patient's presenting symptoms, physical exam findings, and initial radiographic and laboratory data in the context of their medical condition is felt to place them at decreased risk for further clinical deterioration. Furthermore, it is anticipated that the patient will be medically stable for discharge from the hospital within 2 midnights of admission.   Author: Etta Quill., DO 11/24/2021 4:37 AM  For on call review [email or web address].

## 2021-11-24 NOTE — ED Provider Notes (Signed)
[facility] Provider Note   CSN: [id number] Arrival date & time: 11/24/21  0208     History  Chief Complaint  Patient presents with   Shortness of Breath   Level 5 caveat due to respiratory distress Martin Bush is a 66 y.o. male.  The history is provided by the patient and the EMS personnel. The history is limited by the condition of the patient.  Patient with history of ischemic cardiomyopathy presents with respiratory distress.  Patient reports he woke up tonight feeling very out of breath.  EMS was called and they report he was hypoxic and tachypneic on arrival.  He was placed on noninvasive ventilation, given nebulized therapies and nitroglycerin with minimal improvement.  Patient has been hypertensive.  Patient denies any chest pain.     Home Medications Prior to Admission medications   Medication Sig Start Date End Date Taking? Authorizing Provider  amiodarone (PACERONE) 200 MG tablet Take 0.5 tablets (100 mg total) by mouth daily. 10/07/20   Adrian Prows, MD  amLODipine (NORVASC) 5 MG tablet Take 1 tablet (5 mg total) by mouth daily. 09/24/21 12/23/21  Adrian Prows, MD  aspirin EC 81 MG EC tablet Take 1 tablet (81 mg total) by mouth daily. 01/08/19   Nolberto Hanlon, MD  carvedilol (COREG) 25 MG tablet Take 1 tablet (25 mg total) by mouth 2 (two) times daily with a meal. 10/26/21   Adrian Prows, MD  Cholecalciferol (VITAMIN D3) 50 MCG (2000 UT) TABS Take 2,000 Units by mouth 2 (two) times daily.     [provider]  Cyanocobalamin 2500 MCG CHEW Chew 2,500 mcg by mouth daily.     [provider]  empagliflozin (JARDIANCE) 25 MG TABS tablet Take 1 tablet (25 mg total) by mouth daily before breakfast. 11/05/21   Adrian Prows, MD  insulin isophane & regular human (NOVOLIN 70/30 FLEXPEN) (70-30) 100 UNIT/ML KwikPen Inject 30-63 Units into the skin 2 (two) times daily as needed (high blood sugar).    [provider]   metFORMIN (GLUCOPHAGE-XR) 500 MG 24 hr tablet Take 500 mg by mouth 2 (two) times daily.    [provider]  nitroGLYCERIN (NITROSTAT) 0.4 MG SL tablet Place 1 tablet (0.4 mg total) under the tongue every 5 (five) minutes x 3 doses as needed for chest pain. 05/19/20   Adrian Prows, MD  Omega-3 Fatty Acids (FISH OIL) 1000 MG CAPS Take 2,000 mg by mouth 3 (three) times daily.    [provider]  OXYGEN Inhale 2 L/min into the lungs See admin instructions. 2 L/min at bedtime in conjunction with CPAP     [provider]  PRESCRIPTION MEDICATION Inhale into the lungs See admin instructions. CPAP- At bedtime    [provider]  rosuvastatin (CRESTOR) 40 MG tablet Take 1 tablet (40 mg total) by mouth at bedtime. 01/08/19   Nolberto Hanlon, MD  sacubitril-valsartan (ENTRESTO) 97-103 MG Take 1 tablet by mouth 2 (two) times daily. 03/13/20   Adrian Prows, MD  Semaglutide, 1 MG/DOSE, 2 MG/1.5ML SOPN Inject 1 mg into the skin every Sunday. 03/27/20   [provider]  sertraline (ZOLOFT) 50 MG tablet Take 50 mg by mouth daily.    [provider]  ticagrelor (BRILINTA) 90 MG TABS tablet Take 1 tablet (90 mg total) by mouth 2 (two) times daily. 03/13/20   Adrian Prows, MD  torsemide (DEMADEX) 20 MG tablet Take 1 tablet (20 mg total) by mouth daily as  needed (for an overnight weight gain of 3 pounds or more). 04/04/20   Adrian Prows, MD      Allergies    Diltiazem    Review of Systems   Review of Systems  Unable to perform ROS: Severe respiratory distress    Physical Exam Updated Vital Signs BP (!) 150/86 (BP Location: Right Arm)   Pulse 90   Temp (!) 97.4 F (36.3 C) (Axillary)   Resp (!) 32   Ht 1.702 m ('5\' 7"'$ )   Wt 95.3 kg   SpO2 (!) 86%   BMI 32.89 kg/m  Physical Exam CONSTITUTIONAL: Patient is ill-appearing, distress noted HEAD: Normocephalic/atraumatic EYES: EOMI/PERRL ENMT: mask in place NECK: supple no meningeal signs CV: S1/S2 noted  tachypnea, LUNGS: Wheezing bilaterally ABDOMEN: soft, nontender, obese NEURO: Pt is awake/alert/appropriate, moves all extremitiesx4.  No facial droop.   EXTREMITIES: pulses normal/equal, full ROM, no significant lower extremity edema SKIN: skin is cool to touch PSYCH: unable to assess  ED Results / Procedures / Treatments   Labs (all labs ordered are listed, but only abnormal results are displayed) Labs Reviewed  COMPREHENSIVE METABOLIC PANEL - Abnormal; Notable for the following components:      Result Value   Sodium 134 (*)    Glucose, Bld 309 (*)    BUN 29 (*)    Creatinine, Ser 1.39 (*)    Albumin 3.2 (*)    Alkaline Phosphatase 133 (*)    GFR, Estimated 56 (*)    All other components within normal limits  CBC - Abnormal; Notable for the following components:   WBC 16.3 (*)    RBC 4.05 (*)    Hemoglobin 12.5 (*)    HCT 38.8 (*)    All other components within normal limits  I-STAT CHEM 8, ED - Abnormal; Notable for the following components:   BUN 29 (*)    Creatinine, Ser 1.30 (*)    Glucose, Bld 312 (*)    Hemoglobin 12.9 (*)    HCT 38.0 (*)    All other components within normal limits  I-STAT VENOUS BLOOD GAS, ED - Abnormal; Notable for the following components:   pCO2, Ven 41.7 (*)    pO2, Ven 51 (*)    Acid-base deficit 4.0 (*)    HCT 37.0 (*)    Hemoglobin 12.6 (*)    All other components within normal limits  TROPONIN I (HIGH SENSITIVITY) - Abnormal; Notable for the following components:   Troponin I (High Sensitivity) 23 (*)    All other components within normal limits  RESP PANEL BY RT-PCR (FLU A&B, COVID) ARPGX2  BLOOD GAS, VENOUS  BRAIN NATRIURETIC PEPTIDE  PROCALCITONIN    EKG EKG Interpretation  Date/Time:  Tuesday November 24 2021 02:10:48 EDT Ventricular Rate:  90 PR Interval:  187 QRS Duration: 162 QT Interval:  443 QTC Calculation: 543 R Axis:   252 Text Interpretation: paced rhythm Nonspecific IVCD with LAD No significant change since  last tracing Confirmed by Ripley Fraise [id number]) on 11/24/2021 2:13:11 AM Prehospital EKG reveals a paced rhythm heart rate 105 similar to EKG  Radiology DG Chest [facility] 1 View  Result Date: 11/24/2021 CLINICAL DATA:  Shortness of breath and respiratory distress. 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 Positioning of the wires is unaltered. Cardiomegaly similar to prior study. There is an LAD coronary artery stent. There is  perihilar vascular congestion and moderate central edema extending to the bases. Small pleural effusions are forming. Perihilar hazy opacity is most likely due to ground-glass edema but there is also asymmetric patchy consolidation in the left lower lung which could be asymmetric airspace edema or pneumonia. On the prior study there was also perihilar vascular congestion and edema with perihilar alveolar opacities on the prior study denser than seen today. There is a stable mediastinal configuration. There is thoracic spondylosis. IMPRESSION: 1. Cardiomegaly with perihilar vascular congestion and moderate central edema. Progress chest films recommended depending on clinical response. 2. Asymmetric patchy consolidation in the left lower lung which could be asymmetric airspace edema or pneumonia. 3. Small pleural effusions. 4. Previous CABG changes and coronary artery stent. Electronically Signed   By: Telford Nab M.D.   On: 11/24/2021 02:33    Procedures .Critical Care  Performed by: Ripley Fraise, MD Authorized by: Ripley Fraise, MD   Critical care provider statement:    Critical care time (minutes):  64   Critical care start time:  11/24/2021 2:19 AM   Critical care end time:  11/24/2021 3:23 AM   Critical care time was exclusive of:  Separately billable procedures and treating other patients   Critical care was necessary to treat or prevent  imminent or life-threatening deterioration of the following conditions:  Respiratory failure and cardiac failure   Critical care was time spent personally by me on the following activities:  Development of treatment plan with patient or surrogate, examination of patient, obtaining history from patient or surrogate, pulse oximetry, ordering and review of radiographic studies, ordering and review of laboratory studies, re-evaluation of patient's condition, review of old charts, ordering and performing treatments and interventions and evaluation of patient's response to treatment   I assumed direction of critical care for this patient from another provider in my specialty: no     Care discussed with: admitting provider       Medications Ordered in ED Medications  furosemide (LASIX) injection 40 mg (40 mg Intravenous Given 11/24/21 0323)    ED Course/ Medical Decision Making/ A&P Clinical Course as of 11/24/21 0323  Tue Nov 24, 2021  0213 Patient seen on arrival for respiratory distress.  He was on noninvasive ventilation.  He is awake and alert and protecting his airway.  Imaging and labs been ordered. [DW]  0225 Glucose(!): 312 Hyperglycemia [DW]  0240 Patient reports feeling improved.  Vital signs of been improved.  Wife reports that the patient has been laying around all day without any energy.  Tonight he woke up short of breath.  Patient reports he had increasing dyspnea exertion recently.  X-ray consistent with acute pulmonary edema.  We will start treatment [DW]  0243 WBC(!): 16.3 Leukocytosis [DW]  0323 Patient continues to improve.  Vital signs are improving.  We will start to diurese patient. [DW]  9381 Discussed with Dr. Alcario Drought for admission [DW]    Clinical Course User Index [DW] Ripley Fraise, MD                           Medical Decision Making Amount and/or Complexity of Data Reviewed Labs: ordered. Decision-making details documented in ED Course. Radiology:  ordered.  Risk Prescription drug management. Decision regarding hospitalization.   This patient presents to the ED for concern of shortness of breath, this involves an extensive number of treatment options, and is a complaint that carries with it a high risk of  complications and morbidity.  The differential diagnosis includes but is not limited to Acute coronary syndrome, pneumonia, acute pulmonary edema, pneumothorax, acute anemia, pulmonary embolism    Comorbidities that complicate the patient evaluation: Patient's presentation is complicated by their history of CAD, CHF  Social Determinants of Health: Unknown SDOH  Additional history obtained: Additional history obtained from EMS discussed with EMS at bedside Records reviewed  cardiology notes reveal history of ischemic cardiomyopathy  Lab Tests: I Ordered, and personally interpreted labs.  The pertinent results include: Leukocytosis  Imaging Studies ordered: I ordered imaging studies including X-ray chest   I independently visualized and interpreted imaging which showed pulmonary edema I agree with the radiologist interpretation  Cardiac Monitoring: The patient was maintained on a cardiac monitor.  I personally viewed and interpreted the cardiac monitor which showed an underlying rhythm of:   Paced rhythm  Medicines ordered and prescription drug management: I ordered medication including Lasix for pulmonary edema  Critical Interventions:  Noninvasive ventilation  Consultations Obtained: I requested consultation with the admitting physician Dr. Alcario Drought , and discussed  findings as well as pertinent plan - they recommend: Admit  Reevaluation: After the interventions noted above, I reevaluated the patient and found that they have :improved  Complexity of problems addressed: Patient's presentation is most consistent with  acute presentation with potential threat to life or bodily function  Disposition: After  consideration of the diagnostic results and the patient's response to treatment,  I feel that the patent would benefit from admission   .           Final Clinical Impression(s) / ED Diagnoses Final diagnoses:  Acute on chronic systolic congestive heart failure (HCC)  Acute respiratory failure with hypoxia (HCC)  Acute pulmonary edema [facility])    Rx / DC Orders ED Discharge Orders     None         Ripley Fraise, MD 11/24/21 [phone]

## 2021-11-24 NOTE — Assessment & Plan Note (Signed)
Cont ASA, Statin, Coreg

## 2021-11-24 NOTE — ED Notes (Signed)
X-ray at bedside

## 2021-11-24 NOTE — ED Notes (Signed)
Received verbal report from Adam B RN at this time 

## 2021-11-24 NOTE — Assessment & Plan Note (Deleted)
Due to ICM. EF 40-45% on Echo earlier this year. Possibly superimposed PNA but CHF felt more likely. 1. CHF pathway 2. Tele monitor 3. Pt improved on BIPAP 4. Lasix '40mg'$  IV in ED then '40mg'$  IV BID 5. Repeating 2d echo 6. Cont entresto, coreg 7. Procalcitonin pending 8. Monitor for fever

## 2021-11-24 NOTE — Inpatient Diabetes Management (Signed)
Inpatient Diabetes Program Recommendations  AACE/ADA: New Consensus Statement on Inpatient Glycemic Control (2015)  Target Ranges:  Prepandial:   less than 140 mg/dL      Peak postprandial:   less than 180 mg/dL (1-2 hours)      Critically ill patients:  140 - 180 mg/dL   Lab Results  Component Value Date   GLUCAP 242 (H) 11/24/2021   HGBA1C 6.5 05/28/2021    Review of Glycemic Control  Latest Reference Range & Units 07/08/20 06:17 11/24/21 05:18 11/24/21 07:35 11/24/21 13:27  Glucose-Capillary 70 - 99 mg/dL 185 (H) 242 (H) 228 (H) 242 (H)  (H): Data is abnormally high  Diabetes history: DM2 Outpatient Diabetes medications: 70/30 30-63 as needed., Ozempic 1 mg q week, Jardiance 25 mg qd Current orders for Inpatient glycemic control: Novolog 0-9 units q 4 hrs. Correction, Jardiance 25 mg qd  Inpatient Diabetes Program Recommendations:  Patient currently in ED. If admitted,  Please consider: -Add carb mod to diet orders -Change Novolog correction to 0-9 units tid, 0-5 units hs -Add Levemir 10 units bid (0.2 units/kg x 95.3 kg)  Thank you, Bethena Roys E. Lyncoln Ledgerwood, RN, MSN, CDE  Diabetes Coordinator Inpatient Glycemic Control Team Team Pager [phone] (8am-5pm) 11/24/2021 2:02 PM

## 2021-11-24 NOTE — Consult Note (Signed)
CARDIOLOGY CONSULT NOTE  Patient ID: Martin Bush MRN: [id number] DOB/AGE: 31-Dec-1955 66 y.o.  Admit date: 11/24/2021 Referring Physician  Domenic Polite, MD Primary Physician:  [facility], [facility] Reason for Consultation  Acute pulmonary edema  Patient ID: Martin Bush, male    DOB: 1955-12-13, 66 y.o.   MRN: [id number]  Chief Complaint  Patient presents with   Shortness of Breath   HPI:    Martin Bush  is a 67 y.o. Caucasian male patient with CABG x2 in 2002, SVG to circumflex is occluded and has patent LIMA to LAD with diffuse disease in the LAD. Left mid circumflex stent and OM1 in 2015, left main and circumflex proximal stenting in 2016 (restenosis and balloon angioplasty x2) and ischemic cardiomyopathy SP Medtronic BiV ICD.  Patient presented with pulmonary edema and NSTEMI leading to  stenting of the left main and proximal circumflex stent restenosis on 10/09/2019 with a 4 mm DES.  Repeat angiography on 12/11/2019 had revealed widely patent stents.  Past medical history significant for hypertension, hypercholesterolemia, diabetes mellitus with stage IIIa chronic kidney disease, obesity, OSA on CPAP, bilateral carotid artery stenting (left TCAR on 05/26/2020 and a right TCAR on 07/07/2020).  Patient was seen by me about 3 to 4 weeks ago where he was gradually increasing his weight and he had a lot of leg edema as well.  I had warned him about weight gain.  Over the past 3 to 4 days patient got worsening dyspnea and last night suddenly woke up unable to breathe and had to call EMS.  No chest pain, no palpitations, no dizziness or syncope.  No fever or chills.  Past Medical History:  Diagnosis Date   AICD (automatic cardioverter/defibrillator) present    Medtronic   Arthritis    "thumbs"  (08/02/2017)   CHF (congestive heart failure) (HCC)    Chronic combined systolic and diastolic heart failure ([place])    a. 08/2017 Echo: EF 20-25%, mod glob HK. Sev distal ant sept,  inflat HK. Apical AK. Gr2 DD. Mildly reduced RV fxn.   Colon polyps    Depression    History of hiatal hernia    ICD; Biventricular  Medtronic ICD Amplia MRI QWuad CRT-D  in situ 10/29/14 10/29/2014   Remote ICD check 09.23.20:  One 6 beat NSVT. No therapy.  1 SVT episode @ 130 bpm (38 Sec).  There were 23 Vent sense episodes detected for up to 1.1 min/day (AT). Health trends (patient activity, heart rate variability, average heart rates) are stable.Trans-thoracic impedance trends and the OptiVol Fluid Index do no present significant abnormalities. Battery longevity is 4.3 years. RA pacing is 3   Ischemic cardiomyopathy 09/27/2018   MI (myocardial infarction) [facility]) 2003   NSTEMI (non-ST elevated myocardial infarction) ([place]) 07/16/2014   OSA on CPAP    Peripheral vascular disease ([place])    Proteinuria    Sleep apnea    Past Surgical History:  Procedure Laterality Date   BIOPSY  09/19/2018   Procedure: BIOPSY;  Surgeon: Thornton Park, MD;  Location: [facility];  Service: Gastroenterology;;   CARDIAC CATHETERIZATION N/A 07/16/2014   Procedure: Left Heart Cath and Coronary Angiography;  Surgeon: Adrian Prows, MD;  Location: [place] [facility];  Service: Cardiovascular;  Laterality: N/A;   CARDIAC CATHETERIZATION  07/16/2014   Procedure: Coronary Balloon Angioplasty;  Surgeon: Adrian Prows, MD;  Location: [place] [facility];  Service: Cardiovascular;;   CARDIAC CATHETERIZATION  2003   CARDIAC DEFIBRILLATOR PLACEMENT  2016  CATARACT EXTRACTION Left 2018   CATARACT EXTRACTION W/ INTRAOCULAR LENS IMPLANT Left 03/2014   COLONOSCOPY     COLONOSCOPY WITH PROPOFOL N/A 09/19/2018   Procedure: COLONOSCOPY WITH PROPOFOL;  Surgeon: Thornton Park, MD;  Location: [facility];  Service: Gastroenterology;  Laterality: N/A;   CORONARY ANGIOPLASTY WITH STENT PLACEMENT     "I've got a total of 8 stents in there; mostly doine at [facility]" (08/02/2017)   CORONARY ARTERY BYPASS GRAFT  ~ 2003   "CABG X2";  [facility]   CORONARY ATHERECTOMY N/A 08/16/2017   Procedure: CORONARY ATHERECTOMY;  Surgeon: Nigel Mormon, MD;  Location: [place] [facility];  Service: Cardiovascular;  Laterality: N/A;   CORONARY BALLOON ANGIOPLASTY N/A 08/04/2017   Procedure: CORONARY BALLOON ANGIOPLASTY;  Surgeon: Adrian Prows, MD;  Location: [place] [facility];  Service: Cardiovascular;  Laterality: N/A;   CORONARY BALLOON ANGIOPLASTY N/A 01/11/2019   Procedure: CORONARY BALLOON ANGIOPLASTY;  Surgeon: Adrian Prows, MD;  Location: [place] [facility];  Service: Cardiovascular;  Laterality: N/A;   CORONARY BALLOON ANGIOPLASTY N/A 10/09/2019   Procedure: CORONARY BALLOON ANGIOPLASTY;  Surgeon: Adrian Prows, MD;  Location: [place] [facility];  Service: Cardiovascular;  Laterality: N/A;   CORONARY STENT INTERVENTION N/A 08/16/2017   Procedure: CORONARY STENT INTERVENTION;  Surgeon: Nigel Mormon, MD;  Location: [place] [facility];  Service: Cardiovascular;  Laterality: N/A;   CORONARY STENT INTERVENTION N/A 10/09/2019   Procedure: CORONARY STENT INTERVENTION;  Surgeon: Adrian Prows, MD;  Location: [place] [facility];  Service: Cardiovascular;  Laterality: N/A;   ELBOW SURGERY Left ?2001   "pinched nerve"   ENDOSCOPIC MUCOSAL RESECTION  12/18/2018   Procedure: ENDOSCOPIC MUCOSAL RESECTION;  Surgeon: Rush Landmark Telford Nab., MD;  Location: [place];  Service: Gastroenterology;;   EYE SURGERY Left    cataract removal   FLEXIBLE SIGMOIDOSCOPY N/A 12/18/2018   Procedure: FLEXIBLE SIGMOIDOSCOPY;  Surgeon: Irving Copas., MD;  Location: [place];  Service: Gastroenterology;  Laterality: N/A;   HEMOSTASIS CLIP PLACEMENT  12/18/2018   Procedure: HEMOSTASIS CLIP PLACEMENT;  Surgeon: Irving Copas., MD;  Location: [place];  Service: Gastroenterology;;   LEFT HEART CATH AND CORONARY ANGIOGRAPHY N/A 01/11/2019   Procedure: LEFT HEART CATH AND CORONARY ANGIOGRAPHY;  Surgeon: Adrian Prows, MD;  Location: [place] [facility];  Service: Cardiovascular;  Laterality: N/A;   LEFT HEART CATH AND CORS/GRAFTS ANGIOGRAPHY N/A 08/04/2017   Procedure: LEFT HEART CATH AND CORS/GRAFTS ANGIOGRAPHY;  Surgeon: Adrian Prows, MD;  Location: [place] [facility];  Service: Cardiovascular;  Laterality: N/A;   LEFT HEART CATH AND CORS/GRAFTS ANGIOGRAPHY N/A 08/20/2017   Procedure: LEFT HEART CATH AND CORS/GRAFTS ANGIOGRAPHY;  Surgeon: Nigel Mormon, MD;  Location: [place] [facility];  Service: Cardiovascular;  Laterality: N/A;   LEFT HEART CATH AND CORS/GRAFTS ANGIOGRAPHY N/A 01/08/2019   Procedure: LEFT HEART CATH AND CORS/GRAFTS ANGIOGRAPHY;  Surgeon: [place] Hampshire, MD;  Location: [place] [facility];  Service: Cardiovascular;  Laterality: N/A;   LEFT HEART CATH AND CORS/GRAFTS ANGIOGRAPHY N/A 10/09/2019   Procedure: LEFT HEART CATH AND CORS/GRAFTS ANGIOGRAPHY;  Surgeon: Adrian Prows, MD;  Location: [place] [facility];  Service: Cardiovascular;  Laterality: N/A;   LEFT HEART CATH AND CORS/GRAFTS ANGIOGRAPHY N/A 12/11/2019   Procedure: LEFT HEART CATH AND CORS/GRAFTS ANGIOGRAPHY;  Surgeon: Nigel Mormon, MD;  Location: [place] [facility];  Service: Cardiovascular;  Laterality: N/A;   POLYPECTOMY  09/19/2018   Procedure: POLYPECTOMY;  Surgeon: Tarri Glenn,  Joelene Millin, MD;  Location[facility];  Service: Gastroenterology;;   Lia Foyer INJECTION  09/19/2018   Procedure: SUBMUCOSAL INJECTION;  Surgeon: Thornton Park, MD;  Location: [facility];  Service: Gastroenterology;;   Maryagnes Amos INJECTION  12/18/2018   Procedure: SUBMUCOSAL LIFTING INJECTION;  Surgeon: Irving Copas., MD;  Location: [place];  Service: Gastroenterology;;   TRANSCAROTID ARTERY REVASCULARIZATION  Left 05/26/2020   Procedure: LEFT TRANSCAROTID ARTERY REVASCULARIZATION;  Surgeon: [name], MD;  Location: [place];  Service: Vascular;  Laterality: Left;   TRANSCAROTID ARTERY REVASCULARIZATION  Right 07/07/2020    Procedure: RIGHT TRANSCAROTID ARTERY REVASCULARIZATION;  Surgeon: [name], MD;  Location: [place];  Service: Vascular;  Laterality: Right;   ULTRASOUND GUIDANCE FOR VASCULAR ACCESS Right 05/26/2020   Procedure: ULTRASOUND GUIDANCE FOR VASCULAR ACCESS;  Surgeon: [name], MD;  [facility];  Service: Vascular;  Laterality: Right;   ULTRASOUND GUIDANCE FOR VASCULAR ACCESS Left 07/07/2020   Procedure: ULTRASOUND GUIDANCE FOR VASCULAR ACCESS;  Surgeon: [name], MD;  Location: [facility];  Service: Vascular;  Laterality: Left;   Social History   Tobacco Use   Smoking status: Former    Packs/day: 0.25    Years: 27.00    Total pack years: 6.75    Types: Cigars, Cigarettes    Quit date: 2002    Years since quitting: 21.8   Smokeless tobacco: Former    Types: Chew    Quit date: 2002  Substance Use Topics   Alcohol use: Not Currently    Family History  Problem Relation Age of Onset   Uterine cancer Mother    Lung cancer Mother    Hyperlipidemia Father    Heart disease Father    Hypertension Father    Diabetes Father    Hypertension Sister    Hypertension Brother    Colon cancer Neg Hx     Marital Status: Married  ROS  Review of Systems  Cardiovascular:  Positive for dyspnea on exertion and leg swelling. Negative for chest pain.   Objective      11/24/2021   12:00 PM 11/24/2021   11:31 AM 11/24/2021   11:00 AM  Vitals with BMI  Systolic 782 956 213  Diastolic 79 86 89  Pulse 69 71 72    Blood pressure (!) 145/79, pulse 69, temperature 98.3 F (36.8 C), resp. rate 15, height '5\' 7"'$  (1.702 m), weight 95.3 kg, SpO2 94 %.    Physical Exam Constitutional:      General: He is in acute distress (mild).     Appearance: He is obese. He is not toxic-appearing.  Neck:     Vascular: JVD present. No carotid bruit.  Cardiovascular:     Rate and Rhythm: Normal rate and regular rhythm.     Pulses: Intact distal pulses.     Heart sounds: Normal heart  sounds. No murmur heard.    No gallop.  Pulmonary:     Effort: Tachypnea and respiratory distress present.     Breath sounds: Rales (bilateral bases 50%) present.  Abdominal:     General: Bowel sounds are normal.     Palpations: Abdomen is soft.  Musculoskeletal:     Right lower leg: Edema (1+) present.     Left lower leg: Edema (2+ pitting) present.    Laboratory examination:   Recent Labs    11/24/21 0210 11/24/21 0216  NA 134* 135  135  K 4.2 4.3  4.3  CL 101 104  CO2 22  --   GLUCOSE 309* 312*  BUN 29* 29*  CREATININE 1.39* 1.30*  CALCIUM 8.9  --   GFRNONAA 56*  --    estimated creatinine clearance is 62.3 mL/min (A) (by C-G formula based on SCr of 1.3 mg/dL (H)).     Latest Ref Rng & Units 11/24/2021    2:16 AM 11/24/2021    2:10 AM 08/28/2020    1:28 PM  CMP  Glucose 70 - 99 mg/dL 312  309  311   BUN 8 - 23 mg/dL 29  29  38   Creatinine 0.61 - 1.24 mg/dL 1.30  1.39  1.88   Sodium 135 - 145 mmol/L 135 - 145 mmol/L 135    135  134  138   Potassium 3.5 - 5.1 mmol/L 3.5 - 5.1 mmol/L 4.3    4.3  4.2  4.9   Chloride 98 - 111 mmol/L 104  101  106   CO2 22 - 32 mmol/L  22  16   Calcium 8.9 - 10.3 mg/dL  8.9  9.3   Total Protein 6.5 - 8.1 g/dL  6.9    Total Bilirubin 0.3 - 1.2 mg/dL  0.8    Alkaline Phos 38 - 126 U/L  133    AST 15 - 41 U/L  17    ALT 0 - 44 U/L  16        Latest Ref Rng & Units 11/24/2021    2:16 AM 11/24/2021    2:10 AM 07/08/2020    1:46 AM  CBC  WBC 4.0 - 10.5 K/uL  16.3  12.6   Hemoglobin 13.0 - 17.0 g/dL 13.0 - 17.0 g/dL 12.9    12.6  12.5  10.0   Hematocrit 39.0 - 52.0 % 39.0 - 52.0 % 38.0    37.0  38.8  30.3   Platelets 150 - 400 K/uL  279  180    Lipid Panel Recent Labs    10/26/21 1120  CHOL 137  TRIG 76  LDLCALC 74  HDL 48    HEMOGLOBIN A1C Lab Results  Component Value Date   HGBA1C 6.5 05/28/2021   MPG 234.56 05/23/2020   Lab Results  Component Value Date   TSH 1.422 10/09/2019    BNP (last 3  results) Recent Labs    11/24/21 0210  BNP 869.1*   Cardiac Panel (last 3 results) Recent Labs    11/24/21 0210 11/24/21 0401  TROPONINIHS 23* 51*     Medications and allergies   Allergies  Allergen Reactions   Diltiazem Rash     Current Meds  Medication Sig   amiodarone (PACERONE) 200 MG tablet Take 0.5 tablets (100 mg total) by mouth daily.   amLODipine (NORVASC) 5 MG tablet Take 1 tablet (5 mg total) by mouth daily.   aspirin EC 81 MG EC tablet Take 1 tablet (81 mg total) by mouth daily.   carvedilol (COREG) 25 MG tablet Take 1 tablet (25 mg total) by mouth 2 (two) times daily with a meal.   Cholecalciferol (VITAMIN D3) 50 MCG (2000 UT) TABS Take 2,000 Units by mouth 2 (two) times daily.    Cyanocobalamin 2500 MCG CHEW Chew 2,500 mcg by mouth daily.    ferrous sulfate 325 (65 FE) MG tablet Take 325 mg by mouth daily.   insulin isophane & regular human (NOVOLIN 70/30 FLEXPEN) (70-30) 100 UNIT/ML KwikPen Inject 30-63 Units into the skin 2 (two) times daily as needed (high blood  sugar).   metFORMIN (GLUCOPHAGE-XR) 500 MG 24 hr tablet Take 500 mg by mouth 2 (two) times daily.   Omega-3 Fatty Acids (FISH OIL) 1000 MG CAPS Take 2,000 mg by mouth 3 (three) times daily.   OXYGEN Inhale 2 L/min into the lungs See admin instructions. 2 L/min at bedtime in conjunction with CPAP    PRESCRIPTION MEDICATION Inhale into the lungs See admin instructions. CPAP- At bedtime   rosuvastatin (CRESTOR) 40 MG tablet Take 1 tablet (40 mg total) by mouth at bedtime.   sacubitril-valsartan (ENTRESTO) 97-103 MG Take 1 tablet by mouth 2 (two) times daily.   Semaglutide, 1 MG/DOSE, 2 MG/1.5ML SOPN Inject 1 mg into the skin every Sunday.   sertraline (ZOLOFT) 50 MG tablet Take 50 mg by mouth daily.   ticagrelor (BRILINTA) 90 MG TABS tablet Take 1 tablet (90 mg total) by mouth 2 (two) times daily.   torsemide (DEMADEX) 20 MG tablet Take 1 tablet (20 mg total) by mouth daily as needed (for an overnight  weight gain of 3 pounds or more).    Scheduled Meds:  amiodarone  100 mg Oral Daily   amLODipine  5 mg Oral Daily   aspirin EC  81 mg Oral Daily   carvedilol  25 mg Oral BID WC   empagliflozin  25 mg Oral QAC breakfast   enoxaparin (LOVENOX) injection  40 mg Subcutaneous Q24H   ferrous sulfate  325 mg Oral Daily   furosemide  40 mg Intravenous BID   insulin aspart  0-9 Units Subcutaneous Q4H   insulin detemir  10 Units Subcutaneous BID   rosuvastatin  40 mg Oral QHS   sacubitril-valsartan  1 tablet Oral BID   sertraline  50 mg Oral Daily   sodium chloride flush  3 mL Intravenous Q12H   ticagrelor  90 mg Oral BID   Continuous Infusions:  sodium chloride     PRN Meds:.sodium chloride, acetaminophen, ondansetron (ZOFRAN) IV, sodium chloride flush   No intake/output data recorded. No intake/output data recorded.  Net IO Since Admission: No IO data has been entered for this period [11/24/21 1510]   Radiology:   [facility] Chest Port 1 View  11/24/2021 COMPARISON:  Portable chest 12/09/2019  IMPRESSION:  1. Cardiomegaly with perihilar vascular congestion and moderate central edema. Progress chest films recommended depending on clinical response.  2. Asymmetric patchy consolidation in the left lower lung which could be asymmetric airspace edema or pneumonia.  3. Small pleural effusions.  4. Previous CABG changes and coronary artery stent.  Cardiac Studies:   Left Heart Catheterization 12/11/2019:   LM: Protected. Patent LM/LCx stent (10/09/2019- 4.0 x 18 mm resolute Onyx DES) LAD: Prox 100% occluded. Bypassed by patent LIMA attaching in mid LAD        Mid to distal LAD 50% disease LCxL: Patent LM/LCx stent. OM3 60% stenosis, mid segment balloon angioplasty site widely patent from 10/09/2019. RCA: Small caliber with severe diffuse disease         Dampening of the pressures noted on engagement   No change in coronary anatomy compared to prior cath in 10/09/2019    Carotid artery  duplex 10/06/2021: Right Carotid: The stent appears <50% stenosed.  Left Carotid: The stent appears <50% stenosed.  Vertebrals:  Bilateral vertebral arteries demonstrate antegrade flow.  Subclavians: Normal flow hemodynamics were seen in bilateral subclavian arteries.  Echocardiogram 11/24/2021:   1. Left ventricular ejection fraction, by estimation, is 35 to 40%. Left ventricular ejection fraction by 2D MOD  biplane is 30.8 %. The left ventricle has moderately decreased function. The left ventricle demonstrates global hypokinesis. The left  ventricular internal cavity size was moderately dilated. Left ventricular diastolic parameters are consistent with Grade II diastolic dysfunction (pseudonormalization). Elevated left ventricular end-diastolic pressure. There is akinesis of the left  ventricular, apical segment.  2. Right ventricular systolic function is normal. The right ventricular size is normal. Tricuspid regurgitation signal is inadequate for assessing PA pressure.  3. Left atrial size was moderately dilated.  4. The mitral valve is normal in structure. Trivial mitral valve regurgitation. No evidence of mitral stenosis.  5. The aortic valve is normal in structure. Aortic valve regurgitation is trivial. No aortic stenosis is present.  6. The inferior vena cava is normal in size with <50% respiratory variability, suggesting right atrial pressure of 8 mmHg.   Comparison(s): Compared to 01/04/2019, LVEF 25 to 30%. Compared to external report 04/02/2021, no change in LVEF.  Remote CRT-D transmission 10/28/2021: AP 12%, CRT 97%, longevity 1 year and 4 months.  Patient's and thresholds are normal. Ventricular sensing episodes show PVC. Heart failure monitor does not suggest volume overload state.  Normal ICD function.   EKG:  EKG underlying sinus rhythm with biventricularly paced rhythm.  No further analysis.  Assessment   1.  Acute pulm edema 2.  Acute on chronic systolic and diastolic  heart failure 3.  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 4.  Primary hypertension  Recommendations:    Martin Bush is a 66 y.o. Caucasian male patient with CABG x2 in 2002, SVG to circumflex is occluded and has patent LIMA to LAD with diffuse disease in the LAD. Left mid circumflex stent and OM1 in 2015, left main and circumflex proximal stenting in 2016 (restenosis and balloon angioplasty x2) and ischemic cardiomyopathy SP Medtronic BiV ICD.  Patient presented with pulmonary edema and NSTEMI leading to  stenting of the left main and proximal circumflex stent restenosis on 10/09/2019 with a 4 mm DES.  Repeat angiography on 12/11/2019 had revealed widely patent stents.  Past medical history significant for hypertension, hypercholesterolemia, diabetes mellitus with stage IIIa chronic kidney disease, obesity, OSA on CPAP, bilateral carotid artery stenting (left TCAR on 05/26/2020 and a right TCAR on 07/07/2020).  Patient has had acute onset of shortness of breath for the past 2 to 3 days but has gradually been worsening over the past 2 weeks.  His ICD evaluation reveals fairly stable fluid balance.  This presentation is most consistent with acute pulmonary edema, most probably precipitated by ongoing chronic systolic heart failure plus poor compliance with diet.  Patient being closely followed by [place] for heart failure management.  Unfortunately over the last 2 to 3 weeks, he has been gradually gaining weight and in fact I had mentioned after his ICD transmission also office visit that he needed to increase his diuretics and also watch his diet.  Suspect diet is playing a major role.  He is on best medical therapy/guideline directed medical therapy.  The only medication that he is not on with regard to guidelines would be [place].  Given no change in his LVEF, flat troponin, ACS again is unlikely.  I do not suspect ACS, do not suspect restenosis in his left main  stent and we could continue medical management only for now.  I also spoke to his wife over the telephone.  Continue IV Lasix twice daily dosing only for now.  I will continue to follow  him closely.  His saturations are improved.  Respiratory distress has also improved, he is not requiring BiPAP anymore.  He will need very close monitoring.  I reviewed his echocardiogram.  With regard to diabetes mellitus, he is on appropriate medical therapy as well.  He is also on Ozempic both for weight loss and diabetes mellitus, in spite of this unfortunately his weight has been stable or slowly going up again.  Patient does admit to having slacked off from strict diet that they were following recently as his wife is also trying to lose weight but has started to gain weight back.   However with lifestyle changes I truly believe that he would do well.   Renal function has remained stable.  Blood pressure is well controlled.  He has had frequent episodes of NSVT and recurrent ICD discharge in the past and presently has been stable on low-dose of amiodarone with no recurrence of VT.  Critical care time 1 hour, I spent 40 minutes with review of the patient's chart, review of history, discussion with patient, discussion with his wife and discussions regarding critical and life-threatening presentation with acute pulmonary edema and management of the same.   Adrian Prows, MD, [facility] 11/24/2021, 3:10 PM Office: [phone]

## 2021-11-24 NOTE — Assessment & Plan Note (Signed)
Monitor creat with diuresis.

## 2021-11-25 DIAGNOSIS — N189 Chronic kidney disease, unspecified: Secondary | ICD-10-CM

## 2021-11-25 DIAGNOSIS — I255 Ischemic cardiomyopathy: Secondary | ICD-10-CM

## 2021-11-25 DIAGNOSIS — E669 Obesity, unspecified: Secondary | ICD-10-CM | POA: Diagnosis present

## 2021-11-25 DIAGNOSIS — Z8679 Personal history of other diseases of the circulatory system: Secondary | ICD-10-CM | POA: Diagnosis not present

## 2021-11-25 DIAGNOSIS — E119 Type 2 diabetes mellitus without complications: Secondary | ICD-10-CM | POA: Diagnosis not present

## 2021-11-25 DIAGNOSIS — I5023 Acute on chronic systolic (congestive) heart failure: Secondary | ICD-10-CM | POA: Diagnosis not present

## 2021-11-25 DIAGNOSIS — E66811 Obesity, class 1: Secondary | ICD-10-CM | POA: Diagnosis present

## 2021-11-25 DIAGNOSIS — N179 Acute kidney failure, unspecified: Secondary | ICD-10-CM

## 2021-11-25 LAB — BASIC METABOLIC PANEL
Anion gap: 9 (ref 5–15)
BUN: 46 mg/dL — ABNORMAL HIGH (ref 8–23)
CO2: 23 mmol/L (ref 22–32)
Calcium: 9 mg/dL (ref 8.9–10.3)
Chloride: 104 mmol/L (ref 98–111)
Creatinine, Ser: 1.67 mg/dL — ABNORMAL HIGH (ref 0.61–1.24)
GFR, Estimated: 45 mL/min — ABNORMAL LOW (ref >60)
Glucose, Bld: 167 mg/dL — ABNORMAL HIGH (ref 70–99)
Potassium: 4.1 mmol/L (ref 3.5–5.1)
Sodium: 136 mmol/L (ref 135–145)

## 2021-11-25 LAB — GLUCOSE, CAPILLARY
Glucose-Capillary: 233 mg/dL — ABNORMAL HIGH (ref 70–99)
Glucose-Capillary: 188 mg/dL — ABNORMAL HIGH (ref 70–99)
Glucose-Capillary: 212 mg/dL — ABNORMAL HIGH (ref 70–99)

## 2021-11-25 LAB — CBG MONITORING, ED
Glucose-Capillary: 134 mg/dL — ABNORMAL HIGH (ref 70–99)
Glucose-Capillary: 129 mg/dL — ABNORMAL HIGH (ref 70–99)
Glucose-Capillary: 133 mg/dL — ABNORMAL HIGH (ref 70–99)
Glucose-Capillary: 124 mg/dL — ABNORMAL HIGH (ref 70–99)
Glucose-Capillary: 230 mg/dL — ABNORMAL HIGH (ref 70–99)

## 2021-11-25 LAB — BRAIN NATRIURETIC PEPTIDE
B Natriuretic Peptide: 906 pg/mL — ABNORMAL HIGH (ref 0.0–100.0)
B Natriuretic Peptide: 621.1 pg/mL — ABNORMAL HIGH (ref 0.0–100.0)

## 2021-11-25 LAB — MRSA NEXT GEN BY PCR, NASAL: MRSA by PCR Next Gen: NOT DETECTED

## 2021-11-25 LAB — HIV ANTIBODY (ROUTINE TESTING W REFLEX): HIV Screen 4th Generation wRfx: NONREACTIVE

## 2021-11-25 MED ORDER — FUROSEMIDE 10 MG/ML IJ SOLN: 80.0000 mg | Freq: Every day | INTRAMUSCULAR | Status: DC

## 2021-11-25 MED ORDER — FUROSEMIDE 10 MG/ML IJ SOLN
40.0000 mg | Freq: Every day | INTRAMUSCULAR | Status: DC
  Administered 2021-11-26: 40 mg via INTRAVENOUS
  Filled 2021-11-25: qty 4

## 2021-11-25 MED ORDER — FUROSEMIDE 10 MG/ML IJ SOLN
40.0000 mg | Freq: Every day | INTRAMUSCULAR | Status: DC
  Administered 2021-11-25: 40 mg via INTRAVENOUS
  Filled 2021-11-25: qty 4

## 2021-11-25 NOTE — Discharge Planning (Signed)
Pt active at [facility] Provider: Durene Fruits, [place]: [place] phone: [phone] Please call [place] transfer coordinator for additional information [phone]

## 2021-11-25 NOTE — Hospital Course (Signed)
Mrs. Martin Bush was admitted to the hospital with the working diagnosis of heart failure decompensation.   66 yo male with the past medical history of heart failure, coronary artery disease, who presented with dyspnea. Patient had a acute episode of PND, EMS was called and she was found hypoxic, placed on non invasive mechanical ventilation and transported to the ED. At home she had orthopnea and dyspnea on exertion for several days. On her initial physical examination blood pressure 120/59, HR 77, RR 189 and 02 saturation 98%, lungs with wheezing, heart with S1 and S2 present and rhythmic, abdomen with no distention and no lower extremity edema.   VBG 7,32/ 41/ 51/ 23/ 83% Na 134, K 4,2 Cl 101 bicarbonate 22, glucose 309 bun 29 cr 1,39  BNP 869  High sensitive troponin 23 and 51  Wbc 16,3 hgb 12,5 plt 279 Sars covid 19 negative   Chest radiograph with cardiomegaly, bilateral hilar vascular congestion, bilateral interstitial infiltrates, small left pleural effusion, pacemaker defibrillator in place with one atrial and 2 ventricular leads.   EKG 90 bpm, right axis, interventricular conduction delay, qtc 543, atrial sensing and 100% ventricular pacing, with no significant ST segment or T wave changes.   Patient was placed on furosemide for diuresis, and non invasive mechanical ventilation with Bipap.

## 2021-11-25 NOTE — Assessment & Plan Note (Signed)
Continue blood pressure control with amlodipine, carvedilol and entresto Diuresis with torsemide.

## 2021-11-25 NOTE — Assessment & Plan Note (Signed)
CKD stage 3a, Hyponatremia.   Renal function with serum cr at 1,67, K is 4,4 and serum bicarbonate at 23, Plan to continue diuresis and follow up renal function in am. Avoid hypotension and nephrotoxic medications.

## 2021-11-25 NOTE — Assessment & Plan Note (Signed)
No chest pain or anginal, continue antiplatelet therapy and statin.

## 2021-11-25 NOTE — Progress Notes (Signed)
Subjective:  Feels much improved, no chest pain, no palpitations.  States that he was able to rest last night.  Intake/Output from previous day:  I/O last 3 completed shifts: In: -  Out: 1700 [Urine:1700] No intake/output data recorded. Net IO Since Admission: -1,700 mL [11/25/21 0723]  Blood pressure 118/70, pulse 68, temperature 98.2 F (36.8 C), temperature source Oral, resp. rate 14, height _0  (1.702 m), weight 95.3 kg, SpO2 98 %. Physical Exam Neck:     Vascular: JVD present. No carotid bruit.  Cardiovascular:     Rate and Rhythm: Normal rate and regular rhythm.     Pulses: Intact distal pulses.     Heart sounds: Normal heart sounds. No murmur heard.    No gallop.  Pulmonary:     Effort: Pulmonary effort is normal.     Breath sounds: Examination of the right-lower field reveals rales. Examination of the left-lower field reveals rales. Rales present.  Abdominal:     General: Bowel sounds are normal.     Palpations: Abdomen is soft.  Musculoskeletal:     Right lower leg: No edema.     Left lower leg: No edema.   Bibasilar crackles significantly improved compared to yesterday.  Lab Results: Lab Results  Component Value Date   NA 136 11/25/2021   K 4.1 11/25/2021   CO2 23 11/25/2021   GLUCOSE 167 (H) 11/25/2021   BUN 46 (H) 11/25/2021   CREATININE 1.67 (H) 11/25/2021   CALCIUM 9.0 11/25/2021   EGFR 39 (L) 08/28/2020   GFRNONAA 45 (L) 11/25/2021    BNP (last 3 results) Recent Labs    11/24/21 0210 11/25/21 0300  BNP 869.1* 906.0*    ProBNP (last 3 results) Recent Labs    10/26/21 1120  PROBNP 1,873*      Latest Ref Rng & Units 11/25/2021    3:25 AM 11/24/2021    2:16 AM 11/24/2021    2:10 AM  BMP  Glucose 70 - 99 mg/dL 167  312  309   BUN 8 - 23 mg/dL 46  29  29   Creatinine 0.61 - 1.24 mg/dL 1.67  1.30  1.39   Sodium 135 - 145 mmol/L 136  135    135  134   Potassium 3.5 - 5.1 mmol/L 4.1  4.3    4.3  4.2   Chloride 98 - 111 mmol/L 104  104   101   CO2 22 - 32 mmol/L 23   22   Calcium 8.9 - 10.3 mg/dL 9.0   8.9       Latest Ref Rng & Units 11/24/2021    2:10 AM 07/02/2020    2:15 PM 05/23/2020    2:49 PM  Hepatic Function  Total Protein 6.5 - 8.1 g/dL 6.9  7.0  6.8   Albumin 3.5 - 5.0 g/dL 3.2  3.6  3.6   AST 15 - 41 U/L _1 ALT 0 - 44 U/L _2 Alk Phosphatase 38 - 126 U/L 133  164  145   Total Bilirubin 0.3 - 1.2 mg/dL 0.8  0.6  0.9       Latest Ref Rng & Units 11/24/2021    2:16 AM 11/24/2021    2:10 AM 07/08/2020    1:46 AM  CBC  WBC 4.0 - 10.5 K/uL  16.3  12.6   Hemoglobin 13.0 - 17.0 g/dL 13.0 - 17.0 g/dL 12.9  12.6  12.5  10.0   Hematocrit 39.0 - 52.0 % 39.0 - 52.0 % 38.0    37.0  38.8  30.3   Platelets 150 - 400 K/uL  279  180    Lipid Panel     Component Value Date/Time   CHOL 137 10/26/2021 1120   TRIG 76 10/26/2021 1120   HDL 48 10/26/2021 1120   CHOLHDL 2.7 07/08/2020 0146   VLDL 13 07/08/2020 0146   LDLCALC 74 10/26/2021 1120   LDLDIRECT 71.0 10/30/2018 1030   Cardiac Panel (last 3 results) Recent Labs    11/24/21 0210 11/24/21 0401  TROPONINIHS 23* 51*   BNP (last 3 results) Recent Labs    11/24/21 0210 11/25/21 0300  BNP 869.1* 906.0*    ProBNP (last 3 results) Recent Labs    10/26/21 1120  PROBNP 1,873*    HEMOGLOBIN A1C Lab Results  Component Value Date   HGBA1C 6.5 05/28/2021   MPG 234.56 05/23/2020   TSH No results for input(s): "TSH" in the last 8760 hours.   Radiology:    [facility] Chest Port 1 View  11/24/2021 COMPARISON:  Portable chest 12/09/2019  IMPRESSION:  1. Cardiomegaly with perihilar vascular congestion and moderate central edema. Progress chest films recommended depending on clinical response.  2. Asymmetric patchy consolidation in the left lower lung which could be asymmetric airspace edema or pneumonia.  3. Small pleural effusions.  4. Previous CABG changes and coronary artery stent.   Cardiac Studies:    Left Heart  Catheterization 12/11/2019:   LM: Protected. Patent LM/LCx stent (10/09/2019- 4.0 x 18 mm resolute Onyx DES) LAD: Prox 100% occluded. Bypassed by patent LIMA attaching in mid LAD        Mid to distal LAD 50% disease LCxL: Patent LM/LCx stent. OM3 60% stenosis, mid segment balloon angioplasty site widely patent from 10/09/2019. RCA: Small caliber with severe diffuse disease         Dampening of the pressures noted on engagement   No change in coronary anatomy compared to prior cath in 10/09/2019      Carotid artery duplex 10/06/2021: Right Carotid: The stent appears <50% stenosed.  Left Carotid: The stent appears <50% stenosed.  Vertebrals:  Bilateral vertebral arteries demonstrate antegrade flow.  Subclavians: Normal flow hemodynamics were seen in bilateral subclavian arteries.   Echocardiogram 11/24/2021:   1. Left ventricular ejection fraction, by estimation, is 35 to 40%. Left ventricular ejection fraction by 2D MOD biplane is 30.8 %. The left ventricle has moderately decreased function. The left ventricle demonstrates global hypokinesis. The left  ventricular internal cavity size was moderately dilated. Left ventricular diastolic parameters are consistent with Grade II diastolic dysfunction (pseudonormalization). Elevated left ventricular end-diastolic pressure. There is akinesis of the left  ventricular, apical segment.  2. Right ventricular systolic function is normal. The right ventricular size is normal. Tricuspid regurgitation signal is inadequate for assessing PA pressure.  3. Left atrial size was moderately dilated.  4. The mitral valve is normal in structure. Trivial mitral valve regurgitation. No evidence of mitral stenosis.  5. The aortic valve is normal in structure. Aortic valve regurgitation is trivial. No aortic stenosis is present.  6. The inferior vena cava is normal in size with <50% respiratory variability, suggesting right atrial pressure of 8 mmHg.   Comparison(s):  Compared to 01/04/2019, LVEF 25 to 30%. Compared to external report 04/02/2021, no change in LVEF.   Remote CRT-D transmission 10/28/2021: AP 12%, CRT 97%, longevity 1 year and 4  months.  Patient's and thresholds are normal. Ventricular sensing episodes show PVC. Heart failure monitor does not suggest volume overload state.  Normal ICD function.     EKG:   EKG underlying sinus rhythm with biventricularly paced rhythm.  No further analysis.  Scheduled Meds:  amiodarone  100 mg Oral Daily   amLODipine  5 mg Oral Daily   aspirin EC  81 mg Oral Daily   carvedilol  25 mg Oral BID WC   empagliflozin  25 mg Oral QAC breakfast   enoxaparin (LOVENOX) injection  40 mg Subcutaneous Q24H   ferrous sulfate  325 mg Oral Daily   furosemide  40 mg Intravenous BID   insulin aspart  0-9 Units Subcutaneous Q4H   insulin detemir  10 Units Subcutaneous BID   rosuvastatin  40 mg Oral QHS   sacubitril-valsartan  1 tablet Oral BID   sertraline  50 mg Oral Daily   sodium chloride flush  3 mL Intravenous Q12H   ticagrelor  90 mg Oral BID   Continuous Infusions:  sodium chloride     PRN Meds:.sodium chloride, acetaminophen, ondansetron (ZOFRAN) IV, sodium chloride flush  Assessment  Martin Bush is a 66 y.o. male patient with CABG x2 in 2002, SVG to circumflex is occluded and has patent LIMA to LAD with diffuse disease in the LAD. Left mid circumflex stent and OM1 in 2015, left main and circumflex proximal stenting in 2016 (restenosis and balloon angioplasty x2) and ischemic cardiomyopathy SP [employer] BiV ICD.   Patient presented with pulmonary edema and NSTEMI leading to  stenting of the left main and proximal circumflex stent restenosis on 10/09/2019 with a 4 mm DES.  Repeat angiography on 12/11/2019 had revealed widely patent stents.   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 Now admittted with acute pulmonary edema.   1.  Acute on chronic systolic and diastolic heart failure 2.  Acute on chronic renal failure secondary to ATN and cardiorenal syndrome and aggressive diuresis. 3.  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  Plan:   Patient symptoms of dyspnea has improved, will reduce the dose of the furosemide to once a day as patient is also on Jardiance, will hold off on valsartan/sacubitril in view of acute renal failure, continue conservative management for now, do not suspect ACS.  Dietary discretion advised, patient again reinforced.  I also spoke to his wife regarding this.  No significant arrhythmias on telemetry, maintains BiV paced rhythm.  I have discussed with primary team.    Adrian Prows, MD, [facility] 11/25/2021, 7:23 AM Office: [phone] Fax: [phone] Pager: [phone]

## 2021-11-25 NOTE — Progress Notes (Addendum)
Progress Note   Patient: Martin Bush MHD:[id number] DOB: 1956-01-02 DOA: 11/24/2021     1 DOS: the patient was seen and examined on 11/25/2021   Brief hospital course: Mrs. [name] was admitted to the hospital with the working diagnosis of heart failure decompensation.   66 yo male with the past medical history of heart failure, coronary artery disease, who presented with dyspnea. Patient had a acute episode of PND, EMS was called and she was found hypoxic, placed on non invasive mechanical ventilation and transported to the ED. At home she had orthopnea and dyspnea on exertion for several days. On her initial physical examination blood pressure 120/59, HR 77, RR 189 and 02 saturation 98%, lungs with wheezing, heart with S1 and S2 present and rhythmic, abdomen with no distention and no lower extremity edema.   VBG 7,32/ 41/ 51/ 23/ 83% Na 134, K 4,2 Cl 101 bicarbonate 22, glucose 309 bun 29 cr 1,39  BNP 869  High sensitive troponin 23 and 51  Wbc 16,3 hgb 12,5 plt 279 Sars covid 19 negative   Chest radiograph with cardiomegaly, bilateral hilar vascular congestion, bilateral interstitial infiltrates, small left pleural effusion, pacemaker defibrillator in place with one atrial and 2 ventricular leads.   EKG 90 bpm, right axis, interventricular conduction delay, qtc 543, atrial sensing and 100% ventricular pacing, with no significant ST segment or T wave changes.   Patient was placed on furosemide for diuresis, and non invasive mechanical ventilation with Bipap.     Assessment and Plan: * Acute on chronic HFrEF (heart failure with reduced ejection fraction) (HCC) Echocardiogram with reduced LV systolic function EF 35 to 40%, global hypokinesis, akinesis of the left ventricular, and apical segment. RV systolic function preserved, LA with moderate dilatation, no significant valvular disease.   Urine output 2,119 ml Systolic blood pressure 417 to 117 mmHg,  Plan to continue diuresis  with IV furosemide. Medical therapy with carvedilol and empagliflozin Blood pressure control with amlodipine.    Acute kidney injury superimposed on chronic kidney disease (HCC) CKD stage 3a, Hyponatremia.   Renal function with serum cr at 1,67, K is 4,4 and serum bicarbonate at 23, Plan to continue diuresis and follow up renal function in am. Avoid hypotension and nephrotoxic medications.   History of ventricular tachycardia S/p AICD Cont amiodarone  Type 2 diabetes mellitus with hyperlipidemia (HCC) Fasting glucose this am 167 mg/dl Continue insulin sliding scale for glucose cover and monitoring.  Basal insulin with 10 units glargine.  Patient is tolerating po well.   Continue with statin therapy.   HTN (hypertension), benign Continue blood pressure control with amlodipine   Left main coronary artery disease No chest pain or anginal, continue antiplatelet therapy and statin.   Class 1 obesity Calculated BMI is 32,7         Subjective: Patient with no chest pain, dyspnea and lower extremity edema is improving.   Physical Exam: Vitals:   11/25/21 0945 11/25/21 1027 11/25/21 1105 11/25/21 1110  BP: 128/60   117/66  Pulse: 65   67  Resp: 15   17  Temp:  (!) 97.4 F (36.3 C)  (!) 97.5 F (36.4 C)  TempSrc:  Oral  Oral  SpO2: 98%   96%  Weight:   94.8 kg   Height:   '5\' 7"'$  (1.702 m)    Neurology awake and alert ENT with mild pallor Cardiovascular with S1 and S2 present and rhythmic with no gallops or rubs Mild JVD Trace lower  extremity edema Respiratory with prolonged expiratory phase with scattered rales at bases Abdomen with no distention   Data Reviewed:    Family Communication: I spoke with patient's wife at the bedside, we talked in detail about patient's condition, plan of care and prognosis and all questions were addressed.   Disposition: Status is: Inpatient Remains inpatient appropriate because: heart failure   Planned Discharge  Destination: Home    Author: Tawni Millers, MD 11/25/2021 3:03 PM  For on call review [email or web address].

## 2021-11-25 NOTE — Progress Notes (Signed)
Pt set up on CPAP for night rest.  Tolerating well.

## 2021-11-25 NOTE — Assessment & Plan Note (Signed)
Echocardiogram with reduced LV systolic function EF 35 to 40%, global hypokinesis, akinesis of the left ventricular, and apical segment. RV systolic function preserved, LA with moderate dilatation, no significant valvular disease.   Urine output 5,830 ml Systolic blood pressure 746 to 117 mmHg,  Plan to continue diuresis with IV furosemide. Medical therapy with carvedilol and empagliflozin Blood pressure control with amlodipine.

## 2021-11-25 NOTE — Assessment & Plan Note (Signed)
Calculated BMI is 32,7

## 2021-11-26 ENCOUNTER — Other Ambulatory Visit (HOSPITAL_COMMUNITY): Payer: Self-pay

## 2021-11-26 DIAGNOSIS — E1169 Type 2 diabetes mellitus with other specified complication: Secondary | ICD-10-CM | POA: Diagnosis not present

## 2021-11-26 DIAGNOSIS — N1832 Chronic kidney disease, stage 3b: Secondary | ICD-10-CM

## 2021-11-26 DIAGNOSIS — N289 Disorder of kidney and ureter, unspecified: Secondary | ICD-10-CM

## 2021-11-26 DIAGNOSIS — Z8679 Personal history of other diseases of the circulatory system: Secondary | ICD-10-CM | POA: Diagnosis not present

## 2021-11-26 DIAGNOSIS — E785 Hyperlipidemia, unspecified: Secondary | ICD-10-CM

## 2021-11-26 DIAGNOSIS — I10 Essential (primary) hypertension: Secondary | ICD-10-CM

## 2021-11-26 DIAGNOSIS — N179 Acute kidney failure, unspecified: Secondary | ICD-10-CM | POA: Diagnosis not present

## 2021-11-26 DIAGNOSIS — I5023 Acute on chronic systolic (congestive) heart failure: Secondary | ICD-10-CM | POA: Diagnosis not present

## 2021-11-26 DIAGNOSIS — I2510 Atherosclerotic heart disease of native coronary artery without angina pectoris: Secondary | ICD-10-CM

## 2021-11-26 LAB — BASIC METABOLIC PANEL
Anion gap: 10 (ref 5–15)
BUN: 48 mg/dL — ABNORMAL HIGH (ref 8–23)
CO2: 26 mmol/L (ref 22–32)
Calcium: 9 mg/dL (ref 8.9–10.3)
Chloride: 101 mmol/L (ref 98–111)
Creatinine, Ser: 1.53 mg/dL — ABNORMAL HIGH (ref 0.61–1.24)
GFR, Estimated: 50 mL/min — ABNORMAL LOW (ref >60)
Glucose, Bld: 159 mg/dL — ABNORMAL HIGH (ref 70–99)
Potassium: 3.8 mmol/L (ref 3.5–5.1)
Sodium: 137 mmol/L (ref 135–145)

## 2021-11-26 LAB — GLUCOSE, CAPILLARY
Glucose-Capillary: 116 mg/dL — ABNORMAL HIGH (ref 70–99)
Glucose-Capillary: 264 mg/dL — ABNORMAL HIGH (ref 70–99)

## 2021-11-26 LAB — HEMOGLOBIN A1C
Hgb A1c MFr Bld: 8.5 % — ABNORMAL HIGH (ref 4.8–5.6)
Mean Plasma Glucose: 197.25 mg/dL

## 2021-11-26 LAB — BRAIN NATRIURETIC PEPTIDE: B Natriuretic Peptide: 420.4 pg/mL — ABNORMAL HIGH (ref 0.0–100.0)

## 2021-11-26 MED ORDER — SACUBITRIL-VALSARTAN 49-51 MG PO TABS
1.0000 | ORAL_TABLET | Freq: Two times a day (BID) | ORAL | Status: DC
  Administered 2021-11-26: 1 via ORAL
  Filled 2021-11-26 (×2): qty 1

## 2021-11-26 MED ORDER — INSULIN ASPART 100 UNIT/ML IJ SOLN: 0.0000 [IU] | Freq: Every day | INTRAMUSCULAR | Status: DC

## 2021-11-26 MED ORDER — SACUBITRIL-VALSARTAN 49-51 MG PO TABS
1.0000 | ORAL_TABLET | Freq: Two times a day (BID) | ORAL | 0 refills | Status: AC
  Filled 2021-11-26: qty 60, 30d supply, fill #0

## 2021-11-26 MED ORDER — TORSEMIDE 20 MG PO TABS
20.0000 mg | ORAL_TABLET | Freq: Every day | ORAL | 0 refills | Status: DC
  Filled 2021-11-26: qty 30, 30d supply, fill #0

## 2021-11-26 MED ORDER — TORSEMIDE 20 MG PO TABS: 20.0000 mg | ORAL_TABLET | Freq: Every day | ORAL | Status: DC

## 2021-11-26 MED ORDER — INSULIN ASPART 100 UNIT/ML IJ SOLN
0.0000 [IU] | Freq: Three times a day (TID) | INTRAMUSCULAR | Status: DC
  Administered 2021-11-26: 5 [IU] via SUBCUTANEOUS

## 2021-11-26 NOTE — Discharge Summary (Addendum)
Physician Discharge Summary   Patient: Martin Bush MRN: [id number] DOB: [birth year]  Admit date:     11/24/2021  Discharge date: 11/26/21  Discharge Physician: Tawni Millers   PCP: Martin Bush   Recommendations at discharge:    Patient will continue diuresis with torsemide 20 mg daily and follow up renal function as outpatient in 7 days. Continue heart failure management with entresto (lower dose), empagliflozin and carvedilol Patient has been advised in regards to fluid and salt restriction   Discharge Diagnoses: Principal Problem:   Acute on chronic HFrEF (heart failure with reduced ejection fraction) (HCC) Active Problems:   Acute kidney injury superimposed on chronic kidney disease (Martin Bush)   History of ventricular tachycardia   Type 2 diabetes mellitus with hyperlipidemia (HCC)   HTN (hypertension), benign   Left main coronary artery disease   Class 1 obesity   Acute on chronic renal insufficiency   Ischemic cardiomyopathy   Acute on chronic systolic congestive heart failure (Martin Bush)  Resolved Problems:   * No resolved hospital problems. Martin Bush Course: Martin Bush was admitted to the hospital with the working diagnosis of heart failure decompensation.   66 yo male with the past medical history of heart failure, coronary artery disease, who presented with dyspnea. Patient had a acute episode of PND, EMS was called and she was found hypoxic, placed on non invasive mechanical ventilation and transported to the ED. At home he had orthopnea and dyspnea on exertion for several days. On his initial physical examination blood pressure 120/59, HR 77, RR 189 and 02 saturation 98%, lungs with wheezing, heart with S1 and S2 present and rhythmic, abdomen with no distention and no lower extremity edema.   VBG 7,32/ 41/ 51/ 23/ 83% Na 134, K 4,2 Cl 101 bicarbonate 22, glucose 309 bun 29 cr 1,39  BNP 869  High sensitive troponin 23 and 51  Wbc 16,3 hgb 12,5  plt 279 Sars covid 19 negative   Chest radiograph with cardiomegaly, bilateral hilar vascular congestion, bilateral interstitial infiltrates, small left pleural effusion, pacemaker defibrillator in place with one atrial and 2 ventricular leads.   EKG 90 bpm, right axis, interventricular conduction delay, qtc 543, atrial sensing and 100% ventricular pacing, with no significant ST segment or T wave changes.   Patient was placed on furosemide for diuresis, and non invasive mechanical ventilation with Bipap.  His volume status improved and he was weaned off non invasive mechanical ventilation.  Patient will continue diuresis at home and follow up as outpatient.    Assessment and Plan: * Acute on chronic HFrEF (heart failure with reduced ejection fraction) (HCC) Echocardiogram with reduced LV systolic function EF 35 to 40%, global hypokinesis, akinesis of the left ventricular, and apical segment. RV systolic function preserved, LA with moderate dilatation, no significant valvular disease.   Patient was placed on IV furosemide for diuresis, negative fluid balance was achieved -5,332  ml, with significant improvement in his symptoms.   At the time of his discharge patient will continue with carvedilol, entresto and empagliflozin.  Diuresis with daily torsemide 20 mg daily Follow up with cardiology as outpatient.    Acute kidney injury superimposed on chronic kidney disease (HCC) CKD stage 3a, Hyponatremia.   Patient responded well to diuresis, at the time of his discharge his renal function has a serum cr of 1,53 with K at 3,8 and serum bicarbonate at 26. Na 137  ATN has been ruled out.  Continue diuresis with torsemide and  empagliflozin Follow up renal function as outpatient.   History of ventricular tachycardia S/p AICD Cont amiodarone  Type 2 diabetes mellitus with hyperlipidemia (Martin Bush) Patient was placed on insulin sliding scale and 10 units of basal insulin with adequate glucose  control.  At the time of discharge he will resume his home insulin regimen.  Continue with statin therapy.   HTN (hypertension), benign Continue blood pressure control with amlodipine, carvedilol and entresto Diuresis with torsemide.   Left main coronary artery disease No chest pain or anginal, continue antiplatelet (ticagrelor and aspirin) and statin.   Class 1 obesity Calculated BMI is 32,7          Consultants: cardiology  Procedures performed: none   Disposition: Home Diet recommendation:  Cardiac and Carb modified diet DISCHARGE MEDICATION: Allergies as of 11/26/2021       Reactions   Diltiazem Rash        Medication List     STOP taking these medications    sacubitril-valsartan 97-103 MG Commonly known as: ENTRESTO Replaced by: sacubitril-valsartan 49-51 MG       TAKE these medications    amiodarone 200 MG tablet Commonly known as: PACERONE Take 0.5 tablets (100 mg total) by mouth daily.   amLODipine 5 MG tablet Commonly known as: NORVASC Take 1 tablet (5 mg total) by mouth daily.   aspirin EC 81 MG tablet Take 1 tablet (81 mg total) by mouth daily.   carvedilol 25 MG tablet Commonly known as: COREG Take 1 tablet (25 mg total) by mouth 2 (two) times daily with a meal.   Cyanocobalamin 2500 MCG Chew Chew 2,500 mcg by mouth daily.   empagliflozin 25 MG Tabs tablet Commonly known as: Jardiance Take 1 tablet (25 mg total) by mouth daily before breakfast.   ferrous sulfate 325 (65 FE) MG tablet Take 325 mg by mouth daily.   Fish Oil 1000 MG Caps Take 2,000 mg by mouth 3 (three) times daily.   glucose 4 GM chewable tablet CHEW FOUR TABLETS BY MOUTH DAILY AS NEEDED FOR LOW BLOOD SUGAR (REPEAT EVERY 15 MINUTES IF BLOOD SUGAR LESS THAN 70)   metFORMIN 500 MG 24 hr tablet Commonly known as: GLUCOPHAGE-XR Take 500 mg by mouth 2 (two) times daily.   nitroGLYCERIN 0.4 MG SL tablet Commonly known as: NITROSTAT Place 1 tablet (0.4 mg total)  under the tongue every 5 (five) minutes x 3 doses as needed for chest pain.   NovoLIN 70/30 Kwikpen (70-30) 100 UNIT/ML KwikPen Generic drug: insulin isophane & regular human KwikPen Inject 30-63 Units into the skin 2 (two) times daily as needed (high blood sugar).   OXYGEN Inhale 2 L/min into the lungs See admin instructions. 2 L/min at bedtime in conjunction with CPAP   PRESCRIPTION MEDICATION Inhale into the lungs See admin instructions. CPAP- At bedtime   rosuvastatin 40 MG tablet Commonly known as: CRESTOR Take 1 tablet (40 mg total) by mouth at bedtime.   sacubitril-valsartan 49-51 MG Commonly known as: ENTRESTO Take 1 tablet by mouth 2 (two) times daily. Replaces: sacubitril-valsartan 97-103 MG   Semaglutide (1 MG/DOSE) 2 MG/1.5ML Sopn Inject 1 mg into the skin every Sunday.   sertraline 50 MG tablet Commonly known as: ZOLOFT Take 50 mg by mouth daily.   ticagrelor 90 MG Tabs tablet Commonly known as: BRILINTA Take 1 tablet (90 mg total) by mouth 2 (two) times daily.   torsemide 20 MG tablet Commonly known as: DEMADEX Take 1 tablet (20 mg total) by mouth  daily. Start taking on: November 27, 2021 What changed:  when to take this reasons to take this   Vitamin D3 50 MCG (2000 UT) Tabs Take 2,000 Units by mouth 2 (two) times daily.        Discharge Exam: Filed Weights   11/24/21 0213 11/25/21 1105 11/26/21 0517  Weight: 95.3 kg 94.8 kg 92.5 kg   BP 118/66 (BP Location: Left Arm)   Pulse 66   Temp 97.8 F (36.6 C) (Oral)   Resp 18   Ht '5\' 7"'$  (1.702 m)   Wt 92.5 kg   SpO2 93%   BMI 31.95 kg/m   Patient is feeling better, no dyspnea, no orthopnea or PND  Neurology awake and alert ENT with no pallor Cardiovascular with S1 and S2 present and rhythmic Respiratory with no rales of wheezing Abdomen with no distention  No lower extremity edema   Condition at discharge: stable  The results of significant diagnostics from this hospitalization  (including imaging, microbiology, ancillary and laboratory) are listed below for reference.   Imaging Studies: ECHOCARDIOGRAM COMPLETE  Result Date: 11/24/2021    ECHOCARDIOGRAM REPORT   Patient Name:   Martin Bush [facility] Date of Exam: 11/24/2021 Medical Rec #:  [id number]         Height:       67.0 in Accession #:    [id number]        Weight:       210.0 lb Date of Birth:  07/03/1955        BSA:          2.065 m Patient Age:    66 years          BP:           108/59 mmHg Patient Gender: M                 HR:           64 bpm. Exam Location:  Inpatient Procedure: 2D Echo, Cardiac Doppler, Color Doppler and Intracardiac            Opacification Agent Indications:     CHF-Acute Systolic D53.29  History:         Patient has prior history of Echocardiogram examinations, most                  recent 04/05/2021. Cardiomyopathy and CHF, Previous Myocardial                  Infarction; Risk Factors:Sleep Apnea.  Sonographer:     Bernadene Person RDCS Referring Phys:  Peck Diagnosing Phys: Adrian Prows MD IMPRESSIONS  1. Left ventricular ejection fraction, by estimation, is 35 to 40%. Left ventricular ejection fraction by 2D MOD biplane is 30.8 %. The left ventricle has moderately decreased function. The left ventricle demonstrates global hypokinesis. The left ventricular internal cavity size was moderately dilated. Left ventricular diastolic parameters are consistent with Grade II diastolic dysfunction (pseudonormalization). Elevated left ventricular end-diastolic pressure. There is akinesis of the left ventricular, apical segment.  2. Right ventricular systolic function is normal. The right ventricular size is normal. Tricuspid regurgitation signal is inadequate for assessing PA pressure.  3. Left atrial size was moderately dilated.  4. The mitral valve is normal in structure. Trivial mitral valve regurgitation. No evidence of mitral stenosis.  5. The aortic valve is normal in structure. Aortic valve  regurgitation is trivial. No aortic stenosis is present.  6. The inferior vena  cava is normal in size with <50% respiratory variability, suggesting right atrial pressure of 8 mmHg. Comparison(s): Compared to 01/04/2019, LVEF 25 to 30%. Compared to external report 04/02/2021, no change in LVEF. FINDINGS  Left Ventricle: Left ventricular ejection fraction, by estimation, is 35 to 40%. Left ventricular ejection fraction by 2D MOD biplane is 30.8 %. The left ventricle has moderately decreased function. The left ventricle demonstrates global hypokinesis. Definity contrast agent was given IV to delineate the left ventricular endocardial borders. The left ventricular internal cavity size was moderately dilated. There is no left ventricular hypertrophy. Abnormal (paradoxical) septal motion, consistent with left bundle branch block. Left ventricular diastolic parameters are consistent with Grade II diastolic dysfunction (pseudonormalization). Elevated left ventricular end-diastolic pressure. Right Ventricle: The right ventricular size is normal. No increase in right ventricular wall thickness. Right ventricular systolic function is normal. Tricuspid regurgitation signal is inadequate for assessing PA pressure. Left Atrium: Left atrial size was moderately dilated. Right Atrium: Right atrial size was normal in size. Pericardium: There is no evidence of pericardial effusion. Mitral Valve: The mitral valve is normal in structure. Trivial mitral valve regurgitation. No evidence of mitral valve stenosis. Tricuspid Valve: The tricuspid valve is normal in structure. Tricuspid valve regurgitation is not demonstrated. No evidence of tricuspid stenosis. Aortic Valve: The aortic valve is normal in structure. Aortic valve regurgitation is trivial. No aortic stenosis is present. Pulmonic Valve: The pulmonic valve was grossly normal. Pulmonic valve regurgitation is not visualized. Aorta: The aortic root is normal in size and structure.  Venous: The inferior vena cava is normal in size with less than 50% respiratory variability, suggesting right atrial pressure of 8 mmHg. IAS/Shunts: No atrial level shunt detected by color flow Doppler. Additional Comments: A device lead is visualized.  LEFT VENTRICLE PLAX 2D                        Biplane EF (MOD) LVIDd:         6.50 cm         LV Biplane EF:   Left LVIDs:         5.70 cm                          ventricular LV PW:         1.00 cm                          ejection LV IVS:        1.00 cm                          fraction by LVOT diam:     2.10 cm                          2D MOD LV SV:         80                               biplane is LV SV Index:   39                               30.8 %. LVOT Area:     3.46 cm  Diastology                                LV e' medial:    3.50 cm/s LV Volumes (MOD)               LV E/e' medial:  23.2 LV vol d, MOD    158.0 ml      LV e' lateral:   3.62 cm/s A2C:                           LV E/e' lateral: 22.5 LV vol d, MOD    190.0 ml A4C: LV vol s, MOD    102.0 ml A2C: LV vol s, MOD    133.0 ml A4C: LV SV MOD A2C:   56.0 ml LV SV MOD A4C:   190.0 ml LV SV MOD BP:    53.9 ml RIGHT VENTRICLE RV S prime:     5.18 cm/s TAPSE (M-mode): 1.2 cm LEFT ATRIUM             Index        RIGHT ATRIUM           Index LA diam:        4.40 cm 2.13 cm/m   RA Area:     14.00 cm LA Vol (A2C):   69.9 ml 33.86 ml/m  RA Volume:   30.80 ml  14.92 ml/m LA Vol (A4C):   69.0 ml 33.42 ml/m LA Biplane Vol: 69.8 ml 33.81 ml/m  AORTIC VALVE LVOT Vmax:   104.00 cm/s LVOT Vmean:  68.300 cm/s LVOT VTI:    0.232 m  AORTA Ao Root diam: 3.50 cm Ao Asc diam:  3.90 cm MITRAL VALVE MV Area (PHT): 3.91 cm     SHUNTS MV Decel Time: 194 msec     Systemic VTI:  0.23 m MV E velocity: 81.30 cm/s   Systemic Diam: 2.10 cm MV A velocity: 106.00 cm/s MV E/A ratio:  0.77 Adrian Prows MD Electronically signed by Adrian Prows MD Signature Date/Time: 11/24/2021/1:31:30 PM    Final     DG Chest Port 1 View  Result Date: 11/24/2021 CLINICAL DATA:  Shortness of breath and respiratory distress. 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 Positioning of the wires is unaltered. Cardiomegaly similar to prior study. There is an LAD coronary artery stent. There is perihilar vascular congestion and moderate central edema extending to the bases. Small pleural effusions are forming. Perihilar hazy opacity is most likely due to ground-glass edema but there is also asymmetric patchy consolidation in the left lower lung which could be asymmetric airspace edema or pneumonia. On the prior study there was also perihilar vascular congestion and edema with perihilar alveolar opacities on the prior study denser than seen today. There is a stable mediastinal configuration. There is thoracic spondylosis. IMPRESSION: 1. Cardiomegaly with perihilar vascular congestion and moderate central edema. Progress chest films recommended depending on clinical response. 2. Asymmetric patchy consolidation in the left lower lung which could be asymmetric airspace edema or pneumonia. 3. Small pleural effusions. 4. Previous CABG changes and coronary artery stent. Electronically Signed   By: Telford Nab M.D.   On: 11/24/2021 02:33    Microbiology: Results for orders placed or performed during the hospital  encounter of 11/24/21  Resp Panel by RT-PCR (Flu A&B, Covid) Anterior Nasal Swab     Status: None   Collection Time: 11/24/21  2:16 AM   Specimen: Anterior Nasal Swab  Result Value Ref Range Status   SARS Coronavirus 2 by RT PCR NEGATIVE NEGATIVE Final    Comment: (NOTE) SARS-CoV-2 target nucleic acids are NOT DETECTED.  The SARS-CoV-2 RNA is generally detectable in upper respiratory specimens during the acute phase of infection. The lowest concentration of SARS-CoV-2 viral copies this  assay can detect is 138 copies/mL. A negative result does not preclude SARS-Cov-2 infection and should not be used as the sole basis for treatment or other patient management decisions. A negative result may occur with  improper specimen collection/handling, submission of specimen other than nasopharyngeal swab, presence of viral mutation(s) within the areas targeted by this assay, and inadequate number of viral copies(<138 copies/mL). A negative result must be combined with clinical observations, patient history, and epidemiological information. The expected result is Negative.  Fact Sheet for Patients:  EntrepreneurPulse.com.au  Fact Sheet for Healthcare Providers:  IncredibleEmployment.be  This test is no t yet approved or cleared by the [place] FDA and  has been authorized for detection and/or diagnosis of SARS-CoV-2 by FDA under an Emergency Use Authorization (EUA). This EUA will remain  in effect (meaning this test can be used) for the duration of the COVID-19 declaration under Section 564(b)(1) of the Act, 21 U.S.C.section 360bbb-3(b)(1), unless the authorization is terminated  or revoked sooner.       Influenza A by PCR NEGATIVE NEGATIVE Final   Influenza B by PCR NEGATIVE NEGATIVE Final    Comment: (NOTE) The Xpert Xpress SARS-CoV-2/FLU/RSV plus assay is intended as an aid in the diagnosis of influenza from Nasopharyngeal swab specimens and should not be used as a sole basis for treatment. Nasal washings and aspirates are unacceptable for Xpert Xpress SARS-CoV-2/FLU/RSV testing.  Fact Sheet for Patients: EntrepreneurPulse.com.au  Fact Sheet for Healthcare Providers: IncredibleEmployment.be  This test is not yet approved or cleared by the [place] FDA and has been authorized for detection and/or diagnosis of SARS-CoV-2 by FDA under an Emergency Use Authorization (EUA). This EUA will  remain in effect (meaning this test can be used) for the duration of the COVID-19 declaration under Section 564(b)(1) of the Act, 21 U.S.C. section 360bbb-3(b)(1), unless the authorization is terminated or revoked.  Performed at [facility], [place] [address], [place], [place] [address]   MRSA Next Gen by PCR, Nasal     Status: None   Collection Time: 11/25/21 11:14 AM   Specimen: Nasal Mucosa; Nasal Swab  Result Value Ref Range Status   MRSA by PCR Next Gen NOT DETECTED NOT DETECTED Final    Comment: (NOTE) The GeneXpert MRSA Assay (FDA approved for NASAL specimens only), is one component of a comprehensive MRSA colonization surveillance program. It is not intended to diagnose MRSA infection nor to guide or monitor treatment for MRSA infections. Test performance is not FDA approved in patients less than 53 years old. Performed at [facility], [place] [address] [place], [place] [address]     Labs: CBC: Recent Labs  Lab 11/24/21 0210 11/24/21 0216  WBC 16.3*  --   HGB 12.5* 12.9*  12.6*  HCT 38.8* 38.0*  37.0*  MCV 95.8  --   PLT 279  --    Basic Metabolic Panel: Recent Labs  Lab 11/24/21 0210 11/24/21 0216 11/25/21 0325 11/26/21 8295  NA 134* 135  135 136 137  K 4.2 4.3  4.3 4.1 3.8  CL 101 104 104 101  CO2 22  --  23 26  GLUCOSE 309* 312* 167* 159*  BUN 29* 29* 46* 48*  CREATININE 1.39* 1.30* 1.67* 1.53*  CALCIUM 8.9  --  9.0 9.0   Liver Function Tests: Recent Labs  Lab 11/24/21 0210  AST 17  ALT 16  ALKPHOS 133*  BILITOT 0.8  PROT 6.9  ALBUMIN 3.2*   CBG: Recent Labs  Lab 11/25/21 1012 11/25/21 1121 11/25/21 1554 11/25/21 2019 11/26/21 0747  GLUCAP 230* 233* 188* 212* 116*    Discharge time spent: greater than 30 minutes.  Signed: Tawni Millers, MD [facility] 11/26/2021

## 2021-11-26 NOTE — Progress Notes (Signed)
Subjective:  Feels much improved, no chest pain, no palpitations.  States that he was able to rest last night.  Intake/Output from previous day:  I/O last 3 completed shifts: In: 363 [P.O.:360; I.V.:3] Out: 4700 [Urine:4700] No intake/output data recorded. Net IO Since Admission: -4,337 mL [11/26/21 0734]  Blood pressure 122/74, pulse 66, temperature (!) 97 F (36.1 C), temperature source Axillary, resp. rate 19, height _0  (1.702 m), weight 92.5 kg, SpO2 93 %. Physical Exam Neck:     Vascular: No carotid bruit or JVD.  Cardiovascular:     Rate and Rhythm: Normal rate and regular rhythm.     Pulses: Intact distal pulses.     Heart sounds: Normal heart sounds. No murmur heard.    No gallop.  Pulmonary:     Effort: Pulmonary effort is normal.     Breath sounds: Normal breath sounds.  Abdominal:     General: Bowel sounds are normal.     Palpations: Abdomen is soft.  Musculoskeletal:     Right lower leg: Edema (trace) present.     Left lower leg: Edema (trace) present.   Bibasilar crackles significantly improved compared to yesterday.  Lab Results: Lab Results  Component Value Date   NA 137 11/26/2021   K 3.8 11/26/2021   CO2 26 11/26/2021   GLUCOSE 159 (H) 11/26/2021   BUN 48 (H) 11/26/2021   CREATININE 1.53 (H) 11/26/2021   CALCIUM 9.0 11/26/2021   EGFR 39 (L) 08/28/2020   GFRNONAA 50 (L) 11/26/2021    BNP (last 3 results) Recent Labs    11/25/21 0300 11/25/21 1018 11/26/21 0228  BNP 906.0* 621.1* 420.4*     ProBNP (last 3 results) Recent Labs    10/26/21 1120  PROBNP 1,873*       Latest Ref Rng & Units 11/26/2021    2:28 AM 11/25/2021    3:25 AM 11/24/2021    2:16 AM  BMP  Glucose 70 - 99 mg/dL 159  167  312   BUN 8 - 23 mg/dL 48  46  29   Creatinine 0.61 - 1.24 mg/dL 1.53  1.67  1.30   Sodium 135 - 145 mmol/L 137  136  135    135   Potassium 3.5 - 5.1 mmol/L 3.8  4.1  4.3    4.3   Chloride 98 - 111 mmol/L 101  104  104   CO2 22 - 32 mmol/L  26  23    Calcium 8.9 - 10.3 mg/dL 9.0  9.0        Latest Ref Rng & Units 11/24/2021    2:10 AM 07/02/2020    2:15 PM 05/23/2020    2:49 PM  Hepatic Function  Total Protein 6.5 - 8.1 g/dL 6.9  7.0  6.8   Albumin 3.5 - 5.0 g/dL 3.2  3.6  3.6   AST 15 - 41 U/L _1 ALT 0 - 44 U/L _2 Alk Phosphatase 38 - 126 U/L 133  164  145   Total Bilirubin 0.3 - 1.2 mg/dL 0.8  0.6  0.9       Latest Ref Rng & Units 11/24/2021    2:16 AM 11/24/2021    2:10 AM 07/08/2020    1:46 AM  CBC  WBC 4.0 - 10.5 K/uL  16.3  12.6   Hemoglobin 13.0 - 17.0 g/dL 13.0 - 17.0 g/dL 12.9    12.6  12.5  10.0   Hematocrit 39.0 - 52.0 % 39.0 - 52.0 % 38.0    37.0  38.8  30.3   Platelets 150 - 400 K/uL  279  180    Lipid Panel     Component Value Date/Time   CHOL 137 10/26/2021 1120   TRIG 76 10/26/2021 1120   HDL 48 10/26/2021 1120   CHOLHDL 2.7 07/08/2020 0146   VLDL 13 07/08/2020 0146   LDLCALC 74 10/26/2021 1120   LDLDIRECT 71.0 10/30/2018 1030   Cardiac Panel (last 3 results) Recent Labs    11/24/21 0210 11/24/21 0401  TROPONINIHS 23* 51*    BNP (last 3 results) Recent Labs    11/25/21 0300 11/25/21 1018 11/26/21 0228  BNP 906.0* 621.1* 420.4*     ProBNP (last 3 results) Recent Labs    10/26/21 1120  PROBNP 1,873*     HEMOGLOBIN A1C Lab Results  Component Value Date   HGBA1C 8.5 (H) 11/26/2021   MPG 197.25 11/26/2021   TSH No results for input(s): "TSH" in the last 8760 hours.   Radiology:    [facility] Chest Port 1 View  11/24/2021 COMPARISON:  Portable chest 12/09/2019  IMPRESSION:  1. Cardiomegaly with perihilar vascular congestion and moderate central edema. Progress chest films recommended depending on clinical response.  2. Asymmetric patchy consolidation in the left lower lung which could be asymmetric airspace edema or pneumonia.  3. Small pleural effusions.  4. Previous CABG changes and coronary artery stent.   Cardiac Studies:    Left Heart  Catheterization 12/11/2019:   LM: Protected. Patent LM/LCx stent (10/09/2019- 4.0 x 18 mm resolute Onyx DES) LAD: Prox 100% occluded. Bypassed by patent LIMA attaching in mid LAD        Mid to distal LAD 50% disease LCxL: Patent LM/LCx stent. OM3 60% stenosis, mid segment balloon angioplasty site widely patent from 10/09/2019. RCA: Small caliber with severe diffuse disease         Dampening of the pressures noted on engagement   No change in coronary anatomy compared to prior cath in 10/09/2019      Carotid artery duplex 10/06/2021: Right Carotid: The stent appears <50% stenosed.  Left Carotid: The stent appears <50% stenosed.  Vertebrals:  Bilateral vertebral arteries demonstrate antegrade flow.  Subclavians: Normal flow hemodynamics were seen in bilateral subclavian arteries.   Echocardiogram 11/24/2021:   1. Left ventricular ejection fraction, by estimation, is 35 to 40%. Left ventricular ejection fraction by 2D MOD biplane is 30.8 %. The left ventricle has moderately decreased function. The left ventricle demonstrates global hypokinesis. The left  ventricular internal cavity size was moderately dilated. Left ventricular diastolic parameters are consistent with Grade II diastolic dysfunction (pseudonormalization). Elevated left ventricular end-diastolic pressure. There is akinesis of the left  ventricular, apical segment.  2. Right ventricular systolic function is normal. The right ventricular size is normal. Tricuspid regurgitation signal is inadequate for assessing PA pressure.  3. Left atrial size was moderately dilated.  4. The mitral valve is normal in structure. Trivial mitral valve regurgitation. No evidence of mitral stenosis.  5. The aortic valve is normal in structure. Aortic valve regurgitation is trivial. No aortic stenosis is present.  6. The inferior vena cava is normal in size with <50% respiratory variability, suggesting right atrial pressure of 8 mmHg.   Comparison(s):  Compared to 01/04/2019, LVEF 25 to 30%. Compared to external report 04/02/2021, no change in LVEF.   Remote CRT-D transmission 10/28/2021: AP 12%, CRT 97%, longevity 1  year and 4 months.  Patient's and thresholds are normal. Ventricular sensing episodes show PVC. Heart failure monitor does not suggest volume overload state.  Normal ICD function.     EKG:   EKG underlying sinus rhythm with biventricularly paced rhythm.  No further analysis.  Scheduled Meds:  amiodarone  100 mg Oral Daily   amLODipine  5 mg Oral Daily   aspirin EC  81 mg Oral Daily   carvedilol  25 mg Oral BID WC   empagliflozin  25 mg Oral QAC breakfast   enoxaparin (LOVENOX) injection  40 mg Subcutaneous Q24H   ferrous sulfate  325 mg Oral Daily   furosemide  40 mg Intravenous Daily   insulin aspart  0-5 Units Subcutaneous QHS   insulin aspart  0-9 Units Subcutaneous TID WC   insulin detemir  10 Units Subcutaneous BID   rosuvastatin  40 mg Oral QHS   sertraline  50 mg Oral Daily   sodium chloride flush  3 mL Intravenous Q12H   ticagrelor  90 mg Oral BID   Continuous Infusions:  sodium chloride     PRN Meds:.sodium chloride, acetaminophen, ondansetron (ZOFRAN) IV, sodium chloride flush  Assessment  Martin Bush is a 66 y.o. male patient with CABG x2 in 2002, SVG to circumflex is occluded and has patent LIMA to LAD with diffuse disease in the LAD. Left mid circumflex stent and OM1 in 2015, left main and circumflex proximal stenting in 2016 (restenosis and balloon angioplasty x2) and ischemic cardiomyopathy SP [employer] BiV ICD.   Patient presented with pulmonary edema and NSTEMI leading to  stenting of the left main and proximal circumflex stent restenosis on 10/09/2019 with a 4 mm DES.  Repeat angiography on 12/11/2019 had revealed widely patent stents.   Past medical history significant for hypertension, hypercholesterolemia, diabetes mellitus with stage IIIa chronic kidney disease, obesity, OSA on CPAP,  bilateral carotid artery stenting (left TCAR on 05/26/2020 and a right TCAR on 07/07/2020). Now admittted with acute pulmonary edema.   1.  Acute on chronic systolic and diastolic heart failure 2.  Chronic stage 3a CKD, S. Cr back towards baseline. 3.  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  Plan:   Patient symptoms of dyspnea has improved, IV lasix today and transition to oral tomorrow, can be discharged home probably today.   Will resume Entresto but at 49/51 mg dose instead of max dose and titrate back to max dose in the outpatient basis.  Continue Jardiance. OP visit in 1 week with TOC   Adrian Prows, MD, [facility] 11/26/2021, 7:34 AM Office: [phone] Fax: [phone] Pager: [phone]

## 2021-11-26 NOTE — Research (Signed)
[facility] Informed Consent   Subject Name: Martin Bush  Subject met inclusion and exclusion criteria.  The informed consent form, study requirements and expectations were reviewed with the subject and questions and concerns were addressed prior to the signing of the consent form.  The subject verbalized understanding of the trail requirements.  The subject agreed to participate in the [facility] trial and signed the informed consent.  The informed consent was obtained prior to performance of any protocol-specific procedures for the subject.  A copy of the signed informed consent was given to the subject and a copy was placed in the subject's medical record.  [name] 11/25/2021, 2:48 PM   Lung scans performed

## 2021-12-03 ENCOUNTER — Ambulatory Visit: Payer: No Typology Code available for payment source

## 2021-12-03 VITALS — BP 106/65 | HR 73 | Ht 67.0 in | Wt 206.0 lb

## 2021-12-03 DIAGNOSIS — I5042 Chronic combined systolic (congestive) and diastolic (congestive) heart failure: Secondary | ICD-10-CM

## 2021-12-03 DIAGNOSIS — I10 Essential (primary) hypertension: Secondary | ICD-10-CM

## 2021-12-03 DIAGNOSIS — I2510 Atherosclerotic heart disease of native coronary artery without angina pectoris: Secondary | ICD-10-CM

## 2021-12-03 DIAGNOSIS — R0609 Other forms of dyspnea: Secondary | ICD-10-CM

## 2021-12-03 NOTE — Addendum Note (Signed)
Addended by: Ernst Spell on: 12/03/2021 08:56 PM   Modules accepted: Level of Service

## 2021-12-03 NOTE — Progress Notes (Signed)
Primary Physician/Referring:  [facility]  Patient ID: Martin Bush, male    DOB: February 24, 1955, 66 y.o.   MRN: [id number]  Chief Complaint  Patient presents with   Hospitalization Follow-up   Hypertension   Coronary Artery Disease   Congestive Heart Failure    HPI:    Martin Bush  is a 66 y.o. Caucasian male with CAD and history of CABG x2 in 2002  with only LIMA to LAD being patent, Circumflex coronary artery is super dominant. History of stenting to circumflex/OM bifurcation due to recurrent restenosis in 2015, left main and circumflex proximal and midsegment in 2016, angioplasty to left main and proximal and mid circumflex in 2020 and again 10/08/2019. He presented with acute pulmonary edema again on 12/11/2019 and repeat cardiac catheterization essentially revealed widely patent circumflex stent.  Past medical history significant for ischemic cardiomyopathy with severe LV systolic dysfunction, recurrent sustained VT SP ICD implantation, hypertension, mixed hyperlipidemia, uncontrolled diabetes mellitus with stage III chronic kidney disease and bilateral carotid artery stenting (left TCAR on 05/26/2020 and a right TCAR on 07/07/2020).  He recently presented to the ED on 11/24/2021 for worsening dyspnea with exertion and episode of waking up unable to breathe. He was subsequently admitted for diuresis and discharged home on oral diuretics on 11/26/2021.  He presents today for hospital follow-up.  Since discharge from hospital dyspnea on exertion and shortness of breath has improved.  He does feel fatigued and overall low energy. He denies chest pain, shortness of breath, palpitations, leg edema, syncope. Since hospitalization he has been sedentary but does plan to start going to the [facility] for exercise. He has started back on [place], he was previously on this diet and had good success with weight loss.  Past Medical History:  Diagnosis Date   AICD (automatic  cardioverter/defibrillator) present    Medtronic   Arthritis    "thumbs"  (08/02/2017)   CHF (congestive heart failure) (HCC)    Chronic combined systolic and diastolic heart failure ([place])    a. 08/2017 Echo: EF 20-25%, mod glob HK. Sev distal ant sept, inflat HK. Apical AK. Gr2 DD. Mildly reduced RV fxn.   Colon polyps    Depression    History of hiatal hernia    ICD; Biventricular  Medtronic ICD Amplia MRI QWuad CRT-D  in situ 10/29/14 10/29/2014   Remote ICD check 09.23.20:  One 6 beat NSVT. No therapy.  1 SVT episode @ 130 bpm (38 Sec).  There were 23 Vent sense episodes detected for up to 1.1 min/day (AT). Health trends (patient activity, heart rate variability, average heart rates) are stable.Trans-thoracic impedance trends and the OptiVol Fluid Index do no present significant abnormalities. Battery longevity is 4.3 years. RA pacing is 3   Ischemic cardiomyopathy 09/27/2018   MI (myocardial infarction) ([place]) 2003   NSTEMI (non-ST elevated myocardial infarction) ([place]) 07/16/2014   OSA on CPAP    Peripheral vascular disease (HCC)    Proteinuria    Sleep apnea    Social History   Tobacco Use   Smoking status: Former    Packs/day: 0.25    Years: 27.00    Total pack years: 6.75    Types: Cigars, Cigarettes    Quit date: 2002    Years since quitting: 21.8   Smokeless tobacco: Former    Types: Chew    Quit date: 2002  Substance Use Topics   Alcohol use: Not Currently    Marital Status: Married ROS  Review of Systems  Constitutional: Positive for malaise/fatigue.  Cardiovascular:  Negative for chest pain, dyspnea on exertion and leg swelling.   Objective  Blood pressure 106/65, pulse 73, height _0  (1.702 m), weight 206 lb (93.4 kg), SpO2 97 %.     12/03/2021   10:12 AM 11/26/2021   11:35 AM 11/26/2021   10:54 AM  Vitals with BMI  Height _1     Weight 206 lbs    BMI 39.76    Systolic 734 193 790  Diastolic 65 65 66  Pulse 73 65 66     Physical Exam Vitals reviewed.   Constitutional:      Appearance: He is obese.     Comments: He is moderately built and mildly obese in no acute distress.  Neck:     Vascular: No JVD.  Cardiovascular:     Rate and Rhythm: Normal rate and regular rhythm.     Pulses:          Carotid pulses are 1+ on the right side with bruit and 1+ on the left side with bruit.      Femoral pulses are 1+ on the right side and 1+ on the left side.      Popliteal pulses are 1+ on the right side and 1+ on the left side.       Dorsalis pedis pulses are 0 on the right side and 1+ on the left side.       Posterior tibial pulses are 1+ on the right side and 0 on the left side.     Heart sounds: Normal heart sounds.  Pulmonary:     Effort: Pulmonary effort is normal. No accessory muscle usage or respiratory distress.     Breath sounds: Normal breath sounds.  Abdominal:     General: Bowel sounds are normal.     Palpations: Abdomen is soft.  Musculoskeletal:     Right lower leg: Edema (trace) present.     Left lower leg: Edema (trace) present.    Laboratory examination:      Latest Ref Rng & Units 11/26/2021    2:28 AM 11/25/2021    3:25 AM 11/24/2021    2:16 AM  BMP  Glucose 70 - 99 mg/dL 159  167  312   BUN 8 - 23 mg/dL 48  46  29   Creatinine 0.61 - 1.24 mg/dL 1.53  1.67  1.30   Sodium 135 - 145 mmol/L 137  136  135    135   Potassium 3.5 - 5.1 mmol/L 3.8  4.1  4.3    4.3   Chloride 98 - 111 mmol/L 101  104  104   CO2 22 - 32 mmol/L 26  23    Calcium 8.9 - 10.3 mg/dL 9.0  9.0     BNP (last 3 results) Recent Labs    11/25/21 0300 11/25/21 1018 11/26/21 0228  BNP 906.0* 621.1* 420.4*   External labs:  Labs 07/06/2021:  A1c 7.6%.  TSH normal at 1.36.  Serum creatinine 1.580, BUN 29, serum glucose 112 mg, sodium 138, potassium 4.5, EGFR 48 mL.  Total cholesterol 154, triglycerides 211, HDL 52, LDL 88.  LFTs normal.   Labs 01/10/2021:  Hemoglobin 12.4/hematocrit 38.2.  Platelets 198.  Serum creatinine 1.62, BUN  34, sodium 134, potassium 4.5, EGFR 43 mL.  CMP otherwise normal.  A1c 10.6%.  TSH normal.  Total cholesterol 129, triglycerides 140, HDL 46, LDL 55.  Medications and allergies  Allergies  Allergen Reactions   Diltiazem Rash     Current Outpatient Medications:    amiodarone (PACERONE) 200 MG tablet, Take 0.5 tablets (100 mg total) by mouth daily., Disp: 45 tablet, Rfl: 3   amLODipine (NORVASC) 5 MG tablet, Take 1 tablet (5 mg total) by mouth daily., Disp: 30 tablet, Rfl: 2   aspirin EC 81 MG EC tablet, Take 1 tablet (81 mg total) by mouth daily., Disp:  , Rfl:    carvedilol (COREG) 25 MG tablet, Take 1 tablet (25 mg total) by mouth 2 (two) times daily with a meal., Disp: 180 tablet, Rfl: 3   Cholecalciferol (VITAMIN D3) 50 MCG (2000 UT) TABS, Take 2,000 Units by mouth 2 (two) times daily. , Disp: , Rfl:    Cyanocobalamin 2500 MCG CHEW, Chew 2,500 mcg by mouth daily. , Disp: , Rfl:    empagliflozin (JARDIANCE) 25 MG TABS tablet, Take 1 tablet (25 mg total) by mouth daily before breakfast., Disp: 90 tablet, Rfl: 3   ferrous sulfate 325 (65 FE) MG tablet, Take 325 mg by mouth daily., Disp: , Rfl:    glucose 4 GM chewable tablet, , Disp: , Rfl:    insulin isophane & regular human (NOVOLIN 70/30 FLEXPEN) (70-30) 100 UNIT/ML KwikPen, Inject 30-63 Units into the skin 2 (two) times daily as needed (high blood sugar)., Disp: , Rfl:    metFORMIN (GLUCOPHAGE-XR) 500 MG 24 hr tablet, Take 500 mg by mouth 2 (two) times daily., Disp: , Rfl:    nitroGLYCERIN (NITROSTAT) 0.4 MG SL tablet, Place 1 tablet (0.4 mg total) under the tongue every 5 (five) minutes x 3 doses as needed for chest pain., Disp: 25 tablet, Rfl: 3   Omega-3 Fatty Acids (FISH OIL) 1000 MG CAPS, Take 2,000 mg by mouth 3 (three) times daily., Disp: , Rfl:    OXYGEN, Inhale 2 L/min into the lungs See admin instructions. 2 L/min at bedtime in conjunction with CPAP , Disp: , Rfl:    PRESCRIPTION MEDICATION, Inhale into the lungs See admin  instructions. CPAP- At bedtime, Disp: , Rfl:    rosuvastatin (CRESTOR) 40 MG tablet, Take 1 tablet (40 mg total) by mouth at bedtime., Disp:  , Rfl:    sacubitril-valsartan (ENTRESTO) 49-51 MG, Take 1 tablet by mouth 2 (two) times daily., Disp: 60 tablet, Rfl: 0   Semaglutide, 1 MG/DOSE, 2 MG/1.5ML SOPN, Inject 1 mg into the skin every Sunday., Disp: , Rfl:    sertraline (ZOLOFT) 50 MG tablet, Take 50 mg by mouth daily., Disp: , Rfl:    ticagrelor (BRILINTA) 90 MG TABS tablet, Take 1 tablet (90 mg total) by mouth 2 (two) times daily., Disp: 60 tablet, Rfl:    torsemide (DEMADEX) 20 MG tablet, Take 1 tablet (20 mg total) by mouth daily., Disp: 30 tablet, Rfl: 0    Radiology:   Chest X-Ray 10/09/2019:  FINDINGS: Unchanged cardiomegaly. Worsened bilateral interstitial opacities. Remote median sternotomy with unchanged position of left chest wall 3 lead AICD. IMPRESSION: Cardiomegaly and worsened bilateral interstitial opacities likely indicating pulmonary edema.  Cardiac Studies:   Renal artery duplex 09/02/2017: Right: Abnormal right Resistive Index. No evidence of right renal  artery stenosis. Left: Normal left Resistive Index. No evidence of left renal artery stenosis. Mesenteric: Normal Celiac artery findings. 70 to 99% stenosis in the superior mesenteric artery.  Left Heart Catheterization 12/11/2019:   LM: Protected. Patent LM/LCx stent (10/09/2019- 4.0 x 18 mm resolute Onyx DES) LAD: Prox 100% occluded. Bypassed by patent  LIMA attaching in mid LAD        Mid to distal LAD 50% disease LCxL: Patent LM/LCx stent. OM3 60% stenosis, mid segment balloon angioplasty site widely patent from 10/09/2019. RCA: Small caliber with severe diffuse disease         Dampening of the pressures noted on engagement   No change in coronary anatomy compared to prior cath in 09/2019 Current presentation likely due to acute on chronic HFrEF.  Carotid artery duplex 10/06/2021: Right Carotid: The stent  appears <50% stenosed.  Left Carotid: The stent appears <50% stenosed.  Vertebrals:  Bilateral vertebral arteries demonstrate antegrade flow.  Subclavians: Normal flow hemodynamics were seen in bilateral subclavian  arteries.  Echocardiogram 11/24/2021:  1. Left ventricular ejection fraction, by estimation, is 35 to 40%. Left ventricular ejection fraction by 2D MOD biplane is 30.8 %. The left ventricle has moderately decreased function. The left ventricle demonstrates global hypokinesis. The left  ventricular internal cavity size was moderately dilated. Left ventricular diastolic parameters are consistent with Grade II diastolic dysfunction (pseudonormalization). Elevated left ventricular end-diastolic pressure. There is akinesis of the left  ventricular, apical segment.  2. Right ventricular systolic function is normal. The right ventricular size is normal. Tricuspid regurgitation signal is inadequate for assessing PA pressure.  3. Left atrial size was moderately dilated.  4. The mitral valve is normal in structure. Trivial mitral valve regurgitation. No evidence of mitral stenosis.  5. The aortic valve is normal in structure. Aortic valve regurgitation is trivial. No aortic stenosis is present.  6. The inferior vena cava is normal in size with <50% respiratory variability, suggesting right atrial pressure of 8 mmHg.  Device check: Medtronic CRTD Amplia MRI QWuad CRT-D 10/29/14   Remote CRT-D transmission 10/28/2021: AP 12%, CRT 97%, longevity 1 year and 4 months.  Patient's and thresholds are normal. Ventricular sensing episodes show PVC. Heart failure monitor does not suggest volume overload state.  Normal ICD function.   Scheduled  In office ICD 12/25/20  Single (S)/Dual (D)/BV (M) BV Presenting ASBP Pacer dependant: NO. Underlying ASVS @ 60/min. AP 11%, BP 98.% AMS Episodes 0.   HVR 1. Longest 1 Sec EGM = 6 beat NSVT, Latest 12/22/20.  Longevity 2  Years/Voltage.  Lead measurements:  Stable Histogram: Low (L)/normal (N)/high (H)  Normal. Patient activity Good and improved. Thoracic impedance: At baseline and no fluid overload state   Observations: Normal ICD function.  Changes: None  EKG:    EKG 12/03/2021: AV paced rhythm underlying sinus rhythm at rate of 67 bpm.  Compared to previous EKG on 09/24/2021, no significant change.  Assessment     ICD-10-CM   1. Chronic combined systolic and diastolic heart failure (HCC)  I50.42     2. Dyspnea on exertion  R06.09     3. Primary hypertension  I10 EKG 12-Lead    4. ICD; Biventricular  Medtronic ICD Amplia MRI QWuad CRT-D  in situ 10/29/14  Z95.810       No orders of the defined types were placed in this encounter.  There are no discontinued medications.   Recommendations:   Martin Bush  is a 66 y.o. Caucasian male with CAD and history of CABG x2 in 2002  with only LIMA to LAD being patent, Circumflex coronary artery is super dominant. Past medical history significant for ischemic cardiomyopathy with severe LV systolic dysfunction, recurrent sustained VT SP ICD implantation, hypertension, mixed hyperlipidemia, uncontrolled diabetes mellitus with stage IIIa chronic kidney disease and bilateral carotid  artery stenting (left TCAR on 05/26/2020 and a right TCAR on 07/07/2020) patent per carotid duplex on 10/06/2021.  He will need repeat carotid artery duplex in 1 year for surveillance.  Chronic combined systolic and diastolic heart failure (HCC) Dyspnea on exertion No clinical evidence of acute heart failure on physical exam. He does report that dyspnea with exertion is significantly improved since hospitalization. He does feel extremely fatigued since hospitalization and has overall low energy. Feels that the symptoms could be deconditioning related to recent hospitalization and diuresis. We discussed the importance of lifestyle modifications including diet, exercise, and weight loss. I did discuss with him that I could  place a referral to physical therapy to help with deconditioning and at this time he would like to start an exercise regimen at the [facility]. Advised patient that If symptoms worsen or do not improve to contact office. He is on guideline directed therapy for heart failure. Will stop ticagrelor otherwise no changes to current medications.  Primary hypertension Blood pressure if soft today but overall well controlled. Will not make changes to medications as he is not symptomatic to soft blood pressures and has had uncontrolled blood pressure previously.  Coronary artery disease involving native coronary artery of native heart without angina pectoris History of stenting to circumflex/OM bifurcation due to recurrent restenosis in 2015, left main and circumflex proximal and midsegment in 2016, angioplasty to left main and proximal and mid circumflex in 2020 and again 10/08/2019. He presented with acute pulmonary edema again on 12/11/2019 and repeat cardiac catheterization essentially revealed widely patent circumflex stent. He has not had any recurrence of chest pain. We will stop ticagrelor and continue aspirin 81 mg daily.  Follow-up in 6 weeks or sooner if needed.   Ernst Spell, AGNP-C 12/03/2021, 10:58 AM Office: [phone] Fax: [phone] Pager: [phone]

## 2021-12-23 ENCOUNTER — Other Ambulatory Visit: Payer: Self-pay | Admitting: Cardiology

## 2021-12-23 DIAGNOSIS — I10 Essential (primary) hypertension: Secondary | ICD-10-CM

## 2021-12-24 ENCOUNTER — Encounter: Payer: No Typology Code available for payment source | Admitting: Cardiology

## 2021-12-28 ENCOUNTER — Other Ambulatory Visit: Payer: Self-pay

## 2021-12-28 DIAGNOSIS — I5042 Chronic combined systolic (congestive) and diastolic (congestive) heart failure: Secondary | ICD-10-CM

## 2021-12-28 MED ORDER — EMPAGLIFLOZIN 25 MG PO TABS: 25.0000 mg | ORAL_TABLET | Freq: Every day | ORAL | 3 refills | Status: DC

## 2022-01-06 ENCOUNTER — Telehealth: Payer: Self-pay | Admitting: Cardiology

## 2022-01-10 ENCOUNTER — Encounter: Payer: Self-pay | Admitting: Cardiology

## 2022-01-12 NOTE — Telephone Encounter (Signed)
Patient needs close surveillance for heart failure.

## 2022-01-28 ENCOUNTER — Encounter: Payer: Self-pay | Admitting: Cardiology

## 2022-01-28 ENCOUNTER — Telehealth: Payer: Self-pay

## 2022-01-28 ENCOUNTER — Ambulatory Visit: Payer: No Typology Code available for payment source | Admitting: Cardiology

## 2022-01-28 ENCOUNTER — Other Ambulatory Visit: Payer: Self-pay

## 2022-01-28 VITALS — BP 117/70 | HR 76 | Resp 16 | Ht 67.0 in | Wt 207.2 lb

## 2022-01-28 DIAGNOSIS — N1831 Chronic kidney disease, stage 3a: Secondary | ICD-10-CM

## 2022-01-28 DIAGNOSIS — I5042 Chronic combined systolic (congestive) and diastolic (congestive) heart failure: Secondary | ICD-10-CM

## 2022-01-28 DIAGNOSIS — I2510 Atherosclerotic heart disease of native coronary artery without angina pectoris: Secondary | ICD-10-CM

## 2022-01-28 DIAGNOSIS — Z95810 Presence of automatic (implantable) cardiac defibrillator: Secondary | ICD-10-CM

## 2022-01-28 MED ORDER — EMPAGLIFLOZIN 25 MG PO TABS: 25.0000 mg | ORAL_TABLET | Freq: Every day | ORAL | 3 refills | Status: DC

## 2022-01-28 NOTE — Progress Notes (Signed)
Primary Physician/Referring:  [facility], [facility]  Patient ID: Martin Bush, male    DOB: 1956-01-05, 67 y.o.   MRN: [id number]  Chief Complaint  Patient presents with   Congestive Heart Failure   Hypertension   Hyperlipidemia   Follow-up    3 months    HPI:    Martin Bush  is a 67 y.o. Caucasian male with CAD and history of CABG x2 in 2002  with only LIMA to LAD being patent, Circumflex coronary artery is super dominant. Past medical history significant for ischemic cardiomyopathy with severe LV systolic dysfunction, recurrent sustained VT SP ICD implantation, hypertension, mixed hyperlipidemia, controlled diabetes mellitus with stage IIIa chronic kidney disease and bilateral carotid artery stenting (left TCAR on 05/26/2020 and a right TCAR on 07/07/2020).   Past medical history significant for ischemic cardiomyopathy with severe LV systolic dysfunction, recurrent sustained VT SP ICD implantation, hypertension, mixed hyperlipidemia, uncontrolled diabetes mellitus with stage III chronic kidney disease and bilateral carotid artery stenting (left TCAR on 05/26/2020 and a right TCAR on 07/07/2020).  His last admission to the hospitalist acute pulmonary edema was on 11/24/2021 and needed a 3-day hospital stay.  He is presently doing well, I had called him over the holidays after I had reviewed his ICD regarding gradually increasing fluid overload state.  Immediately he change his diet and also restricted his fluid.  States that his weight is down and back to baseline.  He denies chest pain, shortness of breath, palpitations, leg edema, syncope.    Past Medical History:  Diagnosis Date   Arthritis    "thumbs"  (08/02/2017)   Chronic combined systolic and diastolic heart failure ([place])    a. 08/2017 Echo: EF 20-25%, mod glob HK. Sev distal ant sept, inflat HK. Apical AK. Gr2 DD. Mildly reduced RV fxn.   Colon polyps    Depression    History of hiatal hernia    Hyperlipidemia     Hypertension    ICD; Biventricular  Medtronic ICD Amplia MRI QWuad CRT-D  in situ 10/29/14 10/29/2014   Ischemic cardiomyopathy 09/27/2018   NSTEMI (non-ST elevated myocardial infarction) ([place]) 07/16/2014   OSA on CPAP    Peripheral vascular disease (HCC)    Proteinuria    Social History   Tobacco Use   Smoking status: Former    Packs/day: 0.25    Years: 27.00    Total pack years: 6.75    Types: Cigars, Cigarettes    Quit date: 2002    Years since quitting: 22.0   Smokeless tobacco: Former    Types: Chew    Quit date: 2002  Substance Use Topics   Alcohol use: Not Currently    Marital Status: Married ROS  Review of Systems  Cardiovascular:  Negative for chest pain, dyspnea on exertion and leg swelling.   Objective  Blood pressure 117/70, pulse 76, resp. rate 16, height _0  (1.702 m), weight 207 lb 3.2 oz (94 kg), SpO2 95 %.     01/28/2022    2:00 PM 12/03/2021   10:12 AM 11/26/2021   11:35 AM  Vitals with BMI  Height _1  _2    Weight 207 lbs 3 oz 206 lbs   BMI 54.62 70.35   Systolic 009 381 829  Diastolic 70 65 65  Pulse 76 73 65     Physical Exam Vitals reviewed.  Constitutional:      Appearance: He is obese.     Comments: He is moderately built  and mildly obese in no acute distress.  Neck:     Vascular: No JVD.  Cardiovascular:     Rate and Rhythm: Normal rate and regular rhythm.     Pulses:          Carotid pulses are 1+ on the right side with bruit and 1+ on the left side with bruit.      Femoral pulses are 1+ on the right side and 1+ on the left side.      Popliteal pulses are 1+ on the right side and 1+ on the left side.       Dorsalis pedis pulses are 0 on the right side and 1+ on the left side.       Posterior tibial pulses are 1+ on the right side and 0 on the left side.     Heart sounds: Normal heart sounds.  Pulmonary:     Effort: Pulmonary effort is normal. No accessory muscle usage or respiratory distress.     Breath sounds: Normal breath  sounds.  Abdominal:     General: Bowel sounds are normal.     Palpations: Abdomen is soft.  Musculoskeletal:     Right lower leg: Edema (trace) present.     Left lower leg: Edema (trace) present.    Laboratory examination:      Latest Ref Rng & Units 11/26/2021    2:28 AM 11/25/2021    3:25 AM 11/24/2021    2:16 AM  BMP  Glucose 70 - 99 mg/dL 159  167  312   BUN 8 - 23 mg/dL 48  46  29   Creatinine 0.61 - 1.24 mg/dL 1.53  1.67  1.30   Sodium 135 - 145 mmol/L 137  136  135    135   Potassium 3.5 - 5.1 mmol/L 3.8  4.1  4.3    4.3   Chloride 98 - 111 mmol/L 101  104  104   CO2 22 - 32 mmol/L 26  23    Calcium 8.9 - 10.3 mg/dL 9.0  9.0     BNP (last 3 results) Recent Labs    11/25/21 0300 11/25/21 1018 11/26/21 0228  BNP 906.0* 621.1* 420.4*   External labs:  Labs 07/06/2021:  A1c 7.6%.  TSH normal at 1.36.  Serum creatinine 1.580, BUN 29, serum glucose 112 mg, sodium 138, potassium 4.5, EGFR 48 mL.  Total cholesterol 154, triglycerides 211, HDL 52, LDL 88.  LFTs normal.   Labs 01/10/2021:  Hemoglobin 12.4/hematocrit 38.2.  Platelets 198.  Serum creatinine 1.62, BUN 34, sodium 134, potassium 4.5, EGFR 43 mL.  CMP otherwise normal.  A1c 10.6%.  TSH normal.  Total cholesterol 129, triglycerides 140, HDL 46, LDL 55.  Medications and allergies   Allergies  Allergen Reactions   Diltiazem Rash     Current Outpatient Medications:    amiodarone (PACERONE) 200 MG tablet, Take 0.5 tablets (100 mg total) by mouth daily., Disp: 45 tablet, Rfl: 3   amLODipine (NORVASC) 5 MG tablet, TAKE 1 TABLET BY MOUTH DAILY, Disp: 90 tablet, Rfl: 2   aspirin EC 81 MG EC tablet, Take 1 tablet (81 mg total) by mouth daily., Disp:  , Rfl:    carvedilol (COREG) 25 MG tablet, Take 1 tablet (25 mg total) by mouth 2 (two) times daily with a meal., Disp: 180 tablet, Rfl: 3   Cholecalciferol (VITAMIN D3) 50 MCG (2000 UT) TABS, Take 2,000 Units by mouth 2 (two) times daily. , Disp: ,  Rfl:     Cyanocobalamin 2500 MCG CHEW, Chew 2,500 mcg by mouth daily. , Disp: , Rfl:    ferrous sulfate 325 (65 FE) MG tablet, Take 325 mg by mouth daily., Disp: , Rfl:    glucose 4 GM chewable tablet, , Disp: , Rfl:    insulin isophane & regular human (NOVOLIN 70/30 FLEXPEN) (70-30) 100 UNIT/ML KwikPen, Inject 30-63 Units into the skin 2 (two) times daily as needed (high blood sugar)., Disp: , Rfl:    metFORMIN (GLUCOPHAGE-XR) 500 MG 24 hr tablet, Take 500 mg by mouth 2 (two) times daily., Disp: , Rfl:    nitroGLYCERIN (NITROSTAT) 0.4 MG SL tablet, Place 1 tablet (0.4 mg total) under the tongue every 5 (five) minutes x 3 doses as needed for chest pain., Disp: 25 tablet, Rfl: 3   Omega-3 Fatty Acids (FISH OIL) 1000 MG CAPS, Take 2,000 mg by mouth 3 (three) times daily., Disp: , Rfl:    OXYGEN, Inhale 2 L/min into the lungs See admin instructions. 2 L/min at bedtime in conjunction with CPAP , Disp: , Rfl:    PRESCRIPTION MEDICATION, Inhale into the lungs See admin instructions. CPAP- At bedtime, Disp: , Rfl:    rosuvastatin (CRESTOR) 40 MG tablet, Take 1 tablet (40 mg total) by mouth at bedtime., Disp:  , Rfl:    sacubitril-valsartan (ENTRESTO) 49-51 MG, Take 1 tablet by mouth 2 (two) times daily., Disp: 60 tablet, Rfl: 0   Semaglutide, 1 MG/DOSE, 2 MG/1.5ML SOPN, Inject 1 mg into the skin every Sunday., Disp: , Rfl:    sertraline (ZOLOFT) 50 MG tablet, Take 50 mg by mouth daily., Disp: , Rfl:    torsemide (DEMADEX) 20 MG tablet, Take 1 tablet (20 mg total) by mouth daily., Disp: 30 tablet, Rfl: 0   empagliflozin (JARDIANCE) 25 MG TABS tablet, Take 1 tablet (25 mg total) by mouth daily before breakfast., Disp: 90 tablet, Rfl: 3    Radiology:   Chest X-Ray 10/09/2019:  FINDINGS: Unchanged cardiomegaly. Worsened bilateral interstitial opacities. Remote median sternotomy with unchanged position of left chest wall 3 lead AICD. IMPRESSION: Cardiomegaly and worsened bilateral interstitial opacities likely  indicating pulmonary edema.  Cardiac Studies:   Renal artery duplex 09/02/2017: Right: Abnormal right Resistive Index. No evidence of right renal  artery stenosis. Left: Normal left Resistive Index. No evidence of left renal artery stenosis. Mesenteric: Normal Celiac artery findings. 70 to 99% stenosis in the superior mesenteric artery.  Left Heart Catheterization 12/11/2019:   LM: Protected. Patent LM/LCx stent (10/09/2019- 4.0 x 18 mm resolute Onyx DES) LAD: Prox 100% occluded. Bypassed by patent LIMA attaching in mid LAD        Mid to distal LAD 50% disease LCxL: Patent LM/LCx stent. OM3 60% stenosis, mid segment balloon angioplasty site widely patent from 10/09/2019. RCA: Small caliber with severe diffuse disease         Dampening of the pressures noted on engagement   No change in coronary anatomy compared to prior cath in 09/2019 Current presentation likely due to acute on chronic HFrEF.  Carotid artery duplex 10/06/2021: Right Carotid: The stent appears <50% stenosed.  Left Carotid: The stent appears <50% stenosed.  Vertebrals:  Bilateral vertebral arteries demonstrate antegrade flow.  Subclavians: Normal flow hemodynamics were seen in bilateral subclavian  arteries.  Echocardiogram 11/24/2021:  1. Left ventricular ejection fraction, by estimation, is 35 to 40%. Left ventricular ejection fraction by 2D MOD biplane is 30.8 %. The left ventricle has moderately decreased function. The  left ventricle demonstrates global hypokinesis. The left  ventricular internal cavity size was moderately dilated. Left ventricular diastolic parameters are consistent with Grade II diastolic dysfunction (pseudonormalization). Elevated left ventricular end-diastolic pressure. There is akinesis of the left  ventricular, apical segment.  2. Right ventricular systolic function is normal. The right ventricular size is normal. Tricuspid regurgitation signal is inadequate for assessing PA pressure.  3. Left  atrial size was moderately dilated.  4. The mitral valve is normal in structure. Trivial mitral valve regurgitation. No evidence of mitral stenosis.  5. The aortic valve is normal in structure. Aortic valve regurgitation is trivial. No aortic stenosis is present.  6. The inferior vena cava is normal in size with <50% respiratory variability, suggesting right atrial pressure of 8 mmHg.  Device check: Medtronic CRTD Amplia MRI QWuad CRT-D 10/29/14   Remote CRT-D transmission 01/08/2022: Longevity 1 year and 2 months.  AP 15%, CRT 98%.  There were frequent ventricular monitoring episodes, PVCs.  Thoracic impedance is below the baseline but is gradually increasing since mid of Nov 2023.  Normal ICD function.  EKG:    EKG 12/03/2021: AV paced rhythm underlying sinus rhythm at rate of 67 bpm.  Compared to previous EKG on 09/24/2021, no significant change.  Assessment     ICD-10-CM   1. Chronic combined systolic and diastolic heart failure (HCC)  I50.42 PCV ECHOCARDIOGRAM COMPLETE    DISCONTINUED: empagliflozin (JARDIANCE) 25 MG TABS tablet    2. Coronary artery disease involving native coronary artery of native heart without angina pectoris  I25.10     3. ICD; Biventricular  Medtronic ICD Amplia MRI QWuad CRT-D  in situ 10/29/14  Z95.810     4. Type 2 diabetes mellitus with stage 3a chronic kidney disease, with long-term current use of insulin (HCC)  E11.22    N18.31    Z79.4       Meds ordered this encounter  Medications   DISCONTD: empagliflozin (JARDIANCE) 25 MG TABS tablet    Sig: Take 1 tablet (25 mg total) by mouth daily before breakfast.    Dispense:  90 tablet    Refill:  3   Medications Discontinued During This Encounter  Medication Reason   empagliflozin (JARDIANCE) 25 MG TABS tablet Reorder   Recommendations:   Martin Bush  is a 67 y.o. Caucasian male with CAD and history of CABG x2 in 2002  with only LIMA to LAD being patent, Circumflex coronary artery is super dominant.  Past medical history significant for ischemic cardiomyopathy with severe LV systolic dysfunction, recurrent sustained VT SP ICD implantation, hypertension, mixed hyperlipidemia, controlled diabetes mellitus with stage IIIa chronic kidney disease and bilateral carotid artery stenting (left TCAR on 05/26/2020 and a right TCAR on 07/07/2020).   1. Chronic combined systolic and diastolic heart failure ([place]) Patient is presently doing well, I had called him over the weekend as his thoracic impedance was decreasing and he was developing volume excess.  Patient changed his diet and has restricted himself with fluids as well and his weight is back to baseline now.  Today he is not in any acute decompensated heart failure. Overall his LVEF used to be 20 to 25% in 2019 and with aggressive and guideline directed medical therapy, EF has much improved to around 35%.  2. Coronary artery disease involving native coronary artery of native heart without angina pectoris No recurrence of angina pectoris.  3. ICD; Biventricular  Medtronic ICD Amplia MRI QWuad CRT-D  in situ 10/29/14 ICD data  reviewed, he still has 1+ year battery life.  Normal function.  4. Type 2 diabetes mellitus with stage 3a chronic kidney disease, with long-term current use of insulin ([place]) Patient has done well since being on Jardiance, he has been out of the prescription, I given him samples and also prescription was sent to [facility] system.  I would like to continue to see him on a very close basis, 22-monthoffice visit set up.  His wife is present and all questions regarding diet, daily weights, discussions regarding heart failure again held.  I will obtain echocardiogram prior to his next office visit, he will need carotid artery duplex sometime in the fall 2024.  This is to follow-up on TCAR.    JAdrian Prows MD, [facility]1/04/2022, 3:10 PM Office: [phone]: [phone]: [phone]

## 2022-01-28 NOTE — Telephone Encounter (Signed)
I had called the patient during the holidays and had informed him about increasing fluid volume overload state by ICD data.  Looks like his weight is coming down or has remained stable, continue close monitoring.

## 2022-01-28 NOTE — Telephone Encounter (Signed)
Patient's weight has been relatively stable in December. Patient's compliance with taking daily weight needs improvement.  Average Weight Level 203.52 lbs Lowest Weight Level 200.2 lbs Highest Weight Level 212.4 lbs  01/26/2022 Tuesday at 09:05 AM 200.2      01/24/2022 'Sunday at 01:03 PM 201.6      01/23/2022 Saturday at 08:01 AM 205.6      01/22/2022 Friday at 10:18 AM 203.6      01/18/2022 Monday at 10:53 AM 203      01/17/2022 Sunday at 11:41 AM 204      01/15/2022 Friday at 07:55 AM 202      01/13/2022 Wednesday at 10:46 AM 205.6      01/11/2022 Monday at 10:44 AM 204.8      01/10/2022 Sunday at 11:32 AM 204.2      01/08/2022 Friday at 09:47 AM 203.8      12'$ /12/2021 Tuesday at 07:44 AM 202.4

## 2022-03-16 ENCOUNTER — Other Ambulatory Visit: Payer: Self-pay | Admitting: Cardiology

## 2022-03-16 NOTE — Telephone Encounter (Signed)
Should we fill?

## 2022-04-22 ENCOUNTER — Other Ambulatory Visit: Payer: No Typology Code available for payment source

## 2022-04-29 ENCOUNTER — Ambulatory Visit: Payer: No Typology Code available for payment source | Admitting: Cardiology

## 2022-05-20 NOTE — Progress Notes (Signed)
Averages October 2023 Weight Data Average Weight Level 206.95 lbs Lowest Weight Level 202.4 lbs Highest Weight Level 211.6 lbs  Feb 2024 Weight lb -- 200.2 (196.8 - 204.2)  Mar 2024 Weight lb -- 199.3 (194.2 - 201.2)  April 2024 Weight lb -- 200.9 (196.2 - 213.8)

## 2022-05-24 ENCOUNTER — Ambulatory Visit: Payer: No Typology Code available for payment source

## 2022-05-24 DIAGNOSIS — I5042 Chronic combined systolic (congestive) and diastolic (congestive) heart failure: Secondary | ICD-10-CM

## 2022-05-25 NOTE — Progress Notes (Signed)
Echocardiogram 05/24/2022: Severely depressed LV systolic function with visual EF 25-30%. Left ventricle cavity is normal in size. Mild concentric hypertrophy of the left ventricle. Hypokinetic global wall motion. Doppler evidence of grade II (pseudonormal) diastolic dysfunction, elevated LAP. Calculated EF 25%. Left atrial cavity is mildly dilated. Structurally normal trileaflet aortic valve.  Trace aortic regurgitation. Trace mitral regurgitation. Mild calcification of the mitral valve annulus. Structurally normal tricuspid valve with no regurgitation. No evidence of pulmonary hypertension. Compared to 02/2021, DD is now grade II and LVEF is slightly worsened.

## 2022-06-03 ENCOUNTER — Encounter: Payer: Self-pay | Admitting: Cardiology

## 2022-06-03 ENCOUNTER — Ambulatory Visit: Payer: No Typology Code available for payment source | Admitting: Cardiology

## 2022-06-03 VITALS — BP 125/78 | HR 77 | Ht 67.0 in | Wt 207.4 lb

## 2022-06-03 DIAGNOSIS — Z95828 Presence of other vascular implants and grafts: Secondary | ICD-10-CM

## 2022-06-03 DIAGNOSIS — I5042 Chronic combined systolic (congestive) and diastolic (congestive) heart failure: Secondary | ICD-10-CM

## 2022-06-03 DIAGNOSIS — Z95810 Presence of automatic (implantable) cardiac defibrillator: Secondary | ICD-10-CM

## 2022-06-03 DIAGNOSIS — I6523 Occlusion and stenosis of bilateral carotid arteries: Secondary | ICD-10-CM

## 2022-06-03 DIAGNOSIS — I2510 Atherosclerotic heart disease of native coronary artery without angina pectoris: Secondary | ICD-10-CM

## 2022-06-03 DIAGNOSIS — I10 Essential (primary) hypertension: Secondary | ICD-10-CM

## 2022-06-03 MED ORDER — NITROGLYCERIN 0.4 MG SL SUBL: 0.4000 mg | SUBLINGUAL_TABLET | SUBLINGUAL | 3 refills | Status: AC | PRN

## 2022-06-03 NOTE — Progress Notes (Signed)
Primary Physician/Referring:  [facility], [facility]  Patient ID: Martin Bush, male    DOB: 03/09/1955, 67 y.o.   MRN: [id number]  Chief Complaint  Patient presents with   Chronic combined systolic and diastolic heart failure (HCC)   Follow-up    HPI:    Martin Bush  is a 67 y.o. Caucasian male with CAD and history of CABG x2 in 2002  with only LIMA to LAD being patent, Circumflex coronary artery is super dominant. Past medical history significant for ischemic cardiomyopathy with severe LV systolic dysfunction, recurrent sustained VT SP ICD implantation, hypertension, mixed hyperlipidemia, controlled diabetes mellitus with stage IIIa chronic kidney disease and bilateral carotid artery stenting (left TCAR on 05/26/2020 and a right TCAR on 07/07/2020).   Past medical history significant for ischemic cardiomyopathy with severe LV systolic dysfunction, recurrent sustained VT SP ICD implantation, hypertension, mixed hyperlipidemia, uncontrolled diabetes mellitus with stage III chronic kidney disease and bilateral carotid artery stenting (left TCAR on 05/26/2020 and a right TCAR on 07/07/2020).    He is presently doing well. He denies chest pain, shortness of breath, palpitations, leg edema, syncope.    Past Medical History:  Diagnosis Date   Arthritis    "thumbs"  (08/02/2017)   Chronic combined systolic and diastolic heart failure (HCC)    a. 08/2017 Echo: EF 20-25%, mod glob HK. Sev distal ant sept, inflat HK. Apical AK. Gr2 DD. Mildly reduced RV fxn.   Colon polyps    Depression    History of hiatal hernia    Hyperlipidemia    Hypertension    ICD; Biventricular  Medtronic ICD Amplia MRI QWuad CRT-D  in situ 10/29/14 10/29/2014   Ischemic cardiomyopathy 09/27/2018   NSTEMI (non-ST elevated myocardial infarction) (HCC) 07/16/2014   OSA on CPAP    Peripheral vascular disease (HCC)    Proteinuria    Social History   Tobacco Use   Smoking status: Former    Packs/day: 0.25     Years: 27.00    Additional pack years: 0.00    Total pack years: 6.75    Types: Cigars, Cigarettes    Quit date: 2002    Years since quitting: 22.3   Smokeless tobacco: Former    Types: Chew    Quit date: 2002  Substance Use Topics   Alcohol use: Not Currently    Marital Status: Married ROS  Review of Systems  Cardiovascular:  Negative for chest pain, dyspnea on exertion and leg swelling.   Objective  Blood pressure 125/78, pulse 77, height 5\' 7"  (1.702 m), weight 207 lb 6.4 oz (94.1 kg), SpO2 95 %.     06/03/2022    1:06 PM 01/28/2022    2:00 PM 12/03/2021   10:12 AM  Vitals with BMI  Height 5\' 7"  5\' 7"  5\' 7"   Weight 207 lbs 6 oz 207 lbs 3 oz 206 lbs  BMI 32.48 32.44 32.26  Systolic 125 117 295  Diastolic 78 70 65  Pulse 77 76 73     Physical Exam Vitals reviewed.  Constitutional:      Appearance: He is obese.     Comments: He is moderately built and mildly obese in no acute distress.  Neck:     Vascular: Carotid bruit (right) present. No JVD.  Cardiovascular:     Rate and Rhythm: Normal rate and regular rhythm.     Pulses:          Femoral pulses are 1+ on the right side  and 1+ on the left side.      Popliteal pulses are 1+ on the right side and 1+ on the left side.       Dorsalis pedis pulses are 0 on the right side and 1+ on the left side.       Posterior tibial pulses are 1+ on the right side and 0 on the left side.     Heart sounds: Normal heart sounds.  Pulmonary:     Effort: Pulmonary effort is normal. No accessory muscle usage or respiratory distress.     Breath sounds: Normal breath sounds.  Abdominal:     General: Bowel sounds are normal.     Palpations: Abdomen is soft.  Musculoskeletal:     Right lower leg: Edema (trace) present.     Left lower leg: Edema (trace) present.    Laboratory examination:      Latest Ref Rng & Units 11/26/2021    2:28 AM 11/25/2021    3:25 AM 11/24/2021    2:16 AM  BMP  Glucose 70 - 99 mg/dL 295  621  308   BUN 8 - 23  mg/dL 48  46  29   Creatinine 0.61 - 1.24 mg/dL 6.57  8.46  9.62   Sodium 135 - 145 mmol/L 137  136  135    135   Potassium 3.5 - 5.1 mmol/L 3.8  4.1  4.3    4.3   Chloride 98 - 111 mmol/L 101  104  104   CO2 22 - 32 mmol/L 26  23    Calcium 8.9 - 10.3 mg/dL 9.0  9.0     BNP (last 3 results) Recent Labs    11/25/21 0300 11/25/21 1018 11/26/21 0228  BNP 906.0* 621.1* 420.4*   External labs:  Labs 04/07/2022:  Serum creatinine 1.4, EGFR 39.  Microalbumin/creatinine ratio 224.  A1c 7.7%.  Vitamin D38.8.  Iron studies normal.  Labs 11/05/2021:  Total cholesterol 113, triglycerides 108, HDL 45, LDL 46.  Labs 07/06/2021:  X5M 7.6%.  TSH normal at 1.36.  Serum creatinine 1.580, BUN 29, serum glucose 112 mg, sodium 138, potassium 4.5, EGFR 48 mL.  Total cholesterol 154, triglycerides 211, HDL 52, LDL 88.  LFTs normal.  Radiology:   Chest X-Ray 10/09/2019:  FINDINGS: Unchanged cardiomegaly. Worsened bilateral interstitial opacities. Remote median sternotomy with unchanged position of left chest wall 3 lead AICD. IMPRESSION: Cardiomegaly and worsened bilateral interstitial opacities likely indicating pulmonary edema.  Cardiac Studies:   Renal artery duplex 09/02/2017: Right: Abnormal right Resistive Index. No evidence of right renal  artery stenosis. Left: Normal left Resistive Index. No evidence of left renal artery stenosis. Mesenteric: Normal Celiac artery findings. 70 to 99% stenosis in the superior mesenteric artery.  Left Heart Catheterization 12/11/2019:   LM: Protected. Patent LM/LCx stent (10/09/2019- 4.0 x 18 mm resolute Onyx DES) LAD: Prox 100% occluded. Bypassed by patent LIMA attaching in mid LAD        Mid to distal LAD 50% disease LCxL: Patent LM/LCx stent. OM3 60% stenosis, mid segment balloon angioplasty site widely patent from 10/09/2019. RCA: Small caliber with severe diffuse disease         Dampening of the pressures noted on engagement   No change  in coronary anatomy compared to prior cath in 09/2019 Current presentation likely due to acute on chronic HFrEF.  Carotid artery duplex 10/06/2021: Right Carotid: The stent appears <50% stenosed.  Left Carotid: The stent appears <50% stenosed.  Vertebrals:  Bilateral vertebral arteries demonstrate antegrade flow.  Subclavians: Normal flow hemodynamics were seen in bilateral subclavian  arteries.  Echocardiogram 05/24/2022: Severely depressed LV systolic function with visual EF 25-30%. Left ventricle cavity is normal in size. Mild concentric hypertrophy of the left ventricle. Hypokinetic global wall motion. Doppler evidence of grade II (pseudonormal) diastolic dysfunction, elevated LAP. Calculated EF 25%. Left atrial cavity is mildly dilated. Structurally normal trileaflet aortic valve.  Trace aortic regurgitation. Trace mitral regurgitation. Mild calcification of the mitral valve annulus. Structurally normal tricuspid valve with no regurgitation. No evidence of pulmonary hypertension. Compared to 02/2021, DD is now grade II and LVEF is slightly worsened.  Device check: Medtronic CRTD Amplia MRI QWuad CRT-D 10/29/14   Remote CRT-D transmission 05/13/2022 Longevity 9 months.  AP 17 %, CRT 98 %.  There was 1 SVT/AT episode for 2 minutes and 34 seconds on 04/24/2022.  Brief ventricular Singh episodes, PVCs and normally conducted beats.  Thoracic impedance is below the baseline does not suggest volume excess.  Normal CRT-D function.  EKG:    EKG 12/03/2021: AV paced rhythm underlying sinus rhythm at rate of 67 bpm.  Compared to previous EKG on 09/24/2021, no significant change.   Medications and allergies   Allergies  Allergen Reactions   Diltiazem Rash    Current Outpatient Medications:    amiodarone (PACERONE) 200 MG tablet, Take 0.5 tablets (100 mg total) by mouth daily., Disp: 45 tablet, Rfl: 3   amLODipine (NORVASC) 5 MG tablet, TAKE 1 TABLET BY MOUTH DAILY, Disp: 90 tablet, Rfl: 2    aspirin EC 81 MG EC tablet, Take 1 tablet (81 mg total) by mouth daily., Disp:  , Rfl:    carvedilol (COREG) 25 MG tablet, Take 1 tablet (25 mg total) by mouth 2 (two) times daily with a meal., Disp: 180 tablet, Rfl: 3   Cholecalciferol (VITAMIN D3) 50 MCG (2000 UT) TABS, Take 2,000 Units by mouth 2 (two) times daily. , Disp: , Rfl:    Cyanocobalamin 2500 MCG CHEW, Chew 2,500 mcg by mouth daily. , Disp: , Rfl:    empagliflozin (JARDIANCE) 25 MG TABS tablet, Take 1 tablet (25 mg total) by mouth daily before breakfast., Disp: 90 tablet, Rfl: 3   ferrous sulfate 325 (65 FE) MG tablet, Take 325 mg by mouth daily., Disp: , Rfl:    glucose 4 GM chewable tablet, , Disp: , Rfl:    insulin isophane & regular human (NOVOLIN 70/30 FLEXPEN) (70-30) 100 UNIT/ML KwikPen, Inject 30-63 Units into the skin 2 (two) times daily as needed (high blood sugar)., Disp: , Rfl:    metFORMIN (GLUCOPHAGE-XR) 500 MG 24 hr tablet, Take 500 mg by mouth 2 (two) times daily., Disp: , Rfl:    Omega-3 Fatty Acids (FISH OIL) 1000 MG CAPS, Take 2,000 mg by mouth 3 (three) times daily., Disp: , Rfl:    OXYGEN, Inhale 2 L/min into the lungs See admin instructions. 2 L/min at bedtime in conjunction with CPAP , Disp: , Rfl:    PRESCRIPTION MEDICATION, Inhale into the lungs See admin instructions. CPAP- At bedtime, Disp: , Rfl:    rosuvastatin (CRESTOR) 40 MG tablet, Take 1 tablet (40 mg total) by mouth at bedtime., Disp:  , Rfl:    sacubitril-valsartan (ENTRESTO) 49-51 MG, Take 1 tablet by mouth 2 (two) times daily., Disp: 60 tablet, Rfl: 0   Semaglutide, 1 MG/DOSE, 2 MG/1.5ML SOPN, Inject 2 mg into the skin every Sunday., Disp: , Rfl:    sertraline (ZOLOFT) 50  MG tablet, Take 50 mg by mouth daily., Disp: , Rfl:    torsemide (DEMADEX) 20 MG tablet, TAKE 1 TABLET BY MOUTH ONCE A DAY AS NEEDED (FOR AN OVERNIGHT WEIGHT GAIN OF THREE POUNDS OR MORE), Disp: 90 tablet, Rfl: 2   nitroGLYCERIN (NITROSTAT) 0.4 MG SL tablet, Place 1 tablet (0.4 mg  total) under the tongue every 5 (five) minutes x 3 doses as needed for chest pain., Disp: 25 tablet, Rfl: 3   Assessment     ICD-10-CM   1. Chronic combined systolic and diastolic heart failure (HCC)  Z61.09 CANCELED: EKG 12-Lead    2. Coronary artery disease involving native coronary artery of native heart without angina pectoris  I25.10 nitroGLYCERIN (NITROSTAT) 0.4 MG SL tablet    3. Primary hypertension  I10     4. ICD; Biventricular  Medtronic ICD Amplia MRI QWuad CRT-D  in situ 10/29/14  Z95.810     5. Bilateral carotid artery stenosis  I65.23     6. Internal carotid artery stent present  Z95.828       Meds ordered this encounter  Medications   nitroGLYCERIN (NITROSTAT) 0.4 MG SL tablet    Sig: Place 1 tablet (0.4 mg total) under the tongue every 5 (five) minutes x 3 doses as needed for chest pain.    Dispense:  25 tablet    Refill:  3   Medications Discontinued During This Encounter  Medication Reason   nitroGLYCERIN (NITROSTAT) 0.4 MG SL tablet Reorder   Recommendations:   Ruy Dunkelberger  is a 67 y.o. Caucasian male with CAD and history of CABG x2 in 2002  with only LIMA to LAD being patent, Circumflex coronary artery is super dominant. Past medical history significant for ischemic cardiomyopathy with severe LV systolic dysfunction, recurrent sustained VT SP ICD implantation, hypertension, mixed hyperlipidemia, controlled diabetes mellitus with stage IIIa chronic kidney disease and bilateral carotid artery stenting (left TCAR on 05/26/2020 and a right TCAR on 07/07/2020).   1. Chronic combined systolic and diastolic heart failure (HCC) I reviewed the results of the echocardiogram, although his LVEF is severely reduced at around 25 to 30%, he is clinically done remarkably well.  He has maintained weight loss, he has been very strict with his diet.  He has not had any acute decompensated heart failure hospital admissions.  He is on guideline directed medical therapy.  I discussed  with him regarding his reduced EF, advised him to continue to increase his physical activity as tolerated, try to get some more weight off.  Overall he has done well being compliant with his medications and also diet.  I will see him back in 6 months or sooner if problems.  2. Coronary artery disease involving native coronary artery of native heart without angina pectoris I refilled his prescription for Nitrostat. - nitroGLYCERIN (NITROSTAT) 0.4 MG SL tablet; Place 1 tablet (0.4 mg total) under the tongue every 5 (five) minutes x 3 doses as needed for chest pain.  Dispense: 25 tablet; Refill: 3  3. Primary hypertension Blood pressure is well-controlled, his renal function has remained stable, external labs reviewed.  4. ICD; Biventricular  Medtronic ICD Amplia MRI QWuad CRT-D  in situ 10/29/14 I reviewed the ICD data with the patient, normal function, no suggestion of fluid overload state.  5. Bilateral carotid artery stenosis Patient has bilateral carotid artery stents by TCAR, he needs surveillance duplex, I hear a right carotid bruit previously not well-appreciated.  Carotid duplex has been ordered.  Yates Decamp, MD, [facility] 06/03/2022, 1:38 PM Office: [phone] Fax: [phone] Pager: [phone]

## 2022-06-23 ENCOUNTER — Encounter: Payer: Self-pay | Admitting: Cardiology

## 2022-06-23 DIAGNOSIS — Z006 Encounter for examination for normal comparison and control in clinical research program: Secondary | ICD-10-CM

## 2022-06-29 ENCOUNTER — Other Ambulatory Visit: Payer: Self-pay | Admitting: Cardiology

## 2022-06-29 DIAGNOSIS — Z006 Encounter for examination for normal comparison and control in clinical research program: Secondary | ICD-10-CM

## 2022-07-01 ENCOUNTER — Ambulatory Visit: Payer: Self-pay

## 2022-07-01 DIAGNOSIS — Z006 Encounter for examination for normal comparison and control in clinical research program: Secondary | ICD-10-CM

## 2022-07-07 ENCOUNTER — Other Ambulatory Visit: Payer: Self-pay | Admitting: Cardiology

## 2022-07-07 ENCOUNTER — Ambulatory Visit: Payer: Medicare Other

## 2022-07-07 DIAGNOSIS — I2510 Atherosclerotic heart disease of native coronary artery without angina pectoris: Secondary | ICD-10-CM

## 2022-07-07 DIAGNOSIS — I5042 Chronic combined systolic (congestive) and diastolic (congestive) heart failure: Secondary | ICD-10-CM

## 2022-07-07 DIAGNOSIS — Z95828 Presence of other vascular implants and grafts: Secondary | ICD-10-CM

## 2022-07-07 NOTE — Progress Notes (Signed)
ICD-10-CM   1. Chronic combined systolic and diastolic heart failure (HCC)  Z61.09 ECHOCARDIOGRAM COMPLETE    2. Coronary artery disease involving native coronary artery of native heart without angina pectoris  I25.10 ECHOCARDIOGRAM COMPLETE     Orders Placed This Encounter  Procedures   ECHOCARDIOGRAM COMPLETE    Standing Status:   Future    Standing Expiration Date:   07/07/2023    Scheduling Instructions:     Please schedule ASAP before 20th. I will need a CD with his name taken off for research also    Order Specific Question:   Where should this test be performed    Answer:   [place]    Order Specific Question:   Perflutren DEFINITY (image enhancing agent) should be administered unless hypersensitivity or allergy exist    Answer:   Administer Perflutren    Order Specific Question:   Reason for exam-Echo    Answer:   CAD Native Vessel  I25.10    Order Specific Question:   Reason for exam-Echo    Answer:   Cardiomyopathy-Ischemic I25.5

## 2022-07-12 NOTE — Progress Notes (Signed)
Carotid artery duplex 07/07/2022: There is a patent stent in the right carotid from the ICA-prox to the ICA-mid. There is a patent stent in the left carotid from the ICA-prox to the ICA-mid.  The bifurcation, internal, external and common carotid arteries reveal no evidence of significant stenosis, bilaterally. Antegrade right vertebral artery flow. Antegrade left vertebral artery flow.  Compared to the study (report only) done on 10/06/2021, no significant change.

## 2022-07-19 ENCOUNTER — Other Ambulatory Visit: Payer: No Typology Code available for payment source

## 2022-08-16 DIAGNOSIS — Z4502 Encounter for adjustment and management of automatic implantable cardiac defibrillator: Secondary | ICD-10-CM | POA: Diagnosis not present

## 2022-08-16 DIAGNOSIS — Z95810 Presence of automatic (implantable) cardiac defibrillator: Secondary | ICD-10-CM | POA: Diagnosis not present

## 2022-08-16 DIAGNOSIS — I5042 Chronic combined systolic (congestive) and diastolic (congestive) heart failure: Secondary | ICD-10-CM | POA: Diagnosis not present

## 2022-09-08 DIAGNOSIS — J449 Chronic obstructive pulmonary disease, unspecified: Secondary | ICD-10-CM | POA: Diagnosis not present

## 2022-10-15 ENCOUNTER — Telehealth: Payer: Self-pay | Admitting: Gastroenterology

## 2022-10-15 NOTE — Telephone Encounter (Signed)
He needs an office visit with Dr Meridee Score can you please set him up for next available appt? It does not need to be urgent

## 2022-10-15 NOTE — Telephone Encounter (Signed)
Inbound call from patient, calling in regards to scheduling a colonoscopy. Patient is due for a colonoscopy but it needs to be done at the hospital. Please advise.

## 2022-10-18 NOTE — Telephone Encounter (Addendum)
Patient scheduled for 12/26, LM for patient to confirm.

## 2022-12-06 ENCOUNTER — Ambulatory Visit: Payer: Self-pay | Admitting: Cardiology

## 2022-12-28 ENCOUNTER — Telehealth: Payer: Self-pay | Admitting: Family Medicine

## 2022-12-28 NOTE — Telephone Encounter (Signed)
Lab report received on this patient from his insurance company. He is no longer seeing me for his medical care. Please fax this to the [facility] in [place] as it appears he is seeing a doctor there.

## 2022-12-30 NOTE — Telephone Encounter (Signed)
Faxed to [phone] as requested.  Received confirmation of receipt.

## 2023-01-11 ENCOUNTER — Ambulatory Visit: Payer: No Typology Code available for payment source

## 2023-01-11 DIAGNOSIS — I5042 Chronic combined systolic (congestive) and diastolic (congestive) heart failure: Secondary | ICD-10-CM

## 2023-01-12 LAB — CUP PACEART REMOTE DEVICE CHECK
Battery Remaining Longevity: 3 mo
Battery Voltage: 2.75 V
Brady Statistic AP VP Percent: 17.31 %
Brady Statistic AP VS Percent: 0.15 %
Brady Statistic AS VP Percent: 80.41 %
Brady Statistic AS VS Percent: 2.13 %
Brady Statistic RA Percent Paced: 17.12 %
Brady Statistic RV Percent Paced: 93.63 %
Date Time Interrogation Session: 20241217061606
HighPow Impedance: 54 Ohm
Implantable Lead Connection Status: 753985
Implantable Lead Connection Status: 753985
Implantable Lead Connection Status: 753985
Implantable Lead Implant Date: 20161024
Implantable Lead Implant Date: 20161024
Implantable Lead Implant Date: 20161024
Implantable Lead Location: 753858
Implantable Lead Location: 753859
Implantable Lead Location: 753860
Implantable Lead Model: 4598
Implantable Lead Model: 5076
Implantable Pulse Generator Implant Date: 20161024
Lead Channel Impedance Value: 160.941
Lead Channel Impedance Value: 182.4 Ohm
Lead Channel Impedance Value: 182.4 Ohm
Lead Channel Impedance Value: 195.429
Lead Channel Impedance Value: 195.429
Lead Channel Impedance Value: 285 Ohm
Lead Channel Impedance Value: 304 Ohm
Lead Channel Impedance Value: 342 Ohm
Lead Channel Impedance Value: 399 Ohm
Lead Channel Impedance Value: 399 Ohm
Lead Channel Impedance Value: 456 Ohm
Lead Channel Impedance Value: 456 Ohm
Lead Channel Impedance Value: 532 Ohm
Lead Channel Impedance Value: 551 Ohm
Lead Channel Impedance Value: 646 Ohm
Lead Channel Impedance Value: 646 Ohm
Lead Channel Impedance Value: 665 Ohm
Lead Channel Impedance Value: 665 Ohm
Lead Channel Pacing Threshold Amplitude: 0.625 V
Lead Channel Pacing Threshold Amplitude: 1.125 V
Lead Channel Pacing Threshold Amplitude: 1.125 V
Lead Channel Pacing Threshold Pulse Width: 0.4 ms
Lead Channel Pacing Threshold Pulse Width: 0.4 ms
Lead Channel Pacing Threshold Pulse Width: 0.4 ms
Lead Channel Sensing Intrinsic Amplitude: 10.25 mV
Lead Channel Sensing Intrinsic Amplitude: 10.25 mV
Lead Channel Sensing Intrinsic Amplitude: 2.625 mV
Lead Channel Sensing Intrinsic Amplitude: 2.625 mV
Lead Channel Setting Pacing Amplitude: 1.75 V
Lead Channel Setting Pacing Amplitude: 1.75 V
Lead Channel Setting Pacing Amplitude: 2 V
Lead Channel Setting Pacing Pulse Width: 0.4 ms
Lead Channel Setting Pacing Pulse Width: 0.4 ms
Lead Channel Setting Sensing Sensitivity: 0.3 mV
Zone Setting Status: 755011
Zone Setting Status: 755011

## 2023-01-13 ENCOUNTER — Telehealth: Payer: Self-pay

## 2023-01-13 NOTE — Telephone Encounter (Signed)
Outreach made to Pt.  Left detailed message.  Advised Pt had been scheduled for monthly battery checks d/t 3 months to ERI.  Advised would hear a tone from his device when it reaches ERI.  Pt has upcoming appointment with Dr. Jacinto Halim in January.  Will plan to schedule follow up with Dr. Jimmey Ralph at that time to discuss gen change.  Await further needs.

## 2023-01-20 ENCOUNTER — Encounter: Payer: Self-pay | Admitting: Nurse Practitioner

## 2023-01-20 ENCOUNTER — Ambulatory Visit: Payer: No Typology Code available for payment source | Admitting: Gastroenterology

## 2023-02-01 ENCOUNTER — Telehealth: Payer: Self-pay

## 2023-02-01 NOTE — Telephone Encounter (Signed)
 Patient called to advise his ICD battery has reached replacement 02/01/23. Advised scheduling will contact him for apt with EP MD to discuss gen change. Will need MD since he is new patient from Dr. Nadara Eaton. Pt voiced understanding.

## 2023-02-16 ENCOUNTER — Ambulatory Visit: Payer: No Typology Code available for payment source | Attending: Cardiology | Admitting: Cardiology

## 2023-02-16 ENCOUNTER — Encounter: Payer: Self-pay | Admitting: Cardiology

## 2023-02-16 VITALS — BP 116/66 | HR 67 | Resp 16 | Ht 67.0 in | Wt 213.8 lb

## 2023-02-16 DIAGNOSIS — E7800 Pure hypercholesterolemia, unspecified: Secondary | ICD-10-CM

## 2023-02-16 DIAGNOSIS — Z95828 Presence of other vascular implants and grafts: Secondary | ICD-10-CM

## 2023-02-16 DIAGNOSIS — I5042 Chronic combined systolic (congestive) and diastolic (congestive) heart failure: Secondary | ICD-10-CM

## 2023-02-16 DIAGNOSIS — I2510 Atherosclerotic heart disease of native coronary artery without angina pectoris: Secondary | ICD-10-CM

## 2023-02-16 DIAGNOSIS — I10 Essential (primary) hypertension: Secondary | ICD-10-CM

## 2023-02-16 NOTE — Progress Notes (Signed)
Cardiology Office Note:  .   Date:  02/16/2023  ID:  Martin Bush, DOB 1955-09-02, MRN [id number] PCP: Clinic, [facility] Providers Cardiologist:  Yates Decamp, MD   History of Present Illness: .   Martin Bush is a 68 y.o. Caucasian male with CAD and history of CABG x2 in 2002 with only LIMA to LAD being patent, PCI to left main into superdominant LCx on 10/09/2019, ischemic cardiomyopathy with severe LV systolic dysfunction, recurrent sustained VT SP ICD implantation, hypertension, mixed hyperlipidemia, controlled diabetes mellitus with stage IIIa chronic kidney disease and bilateral carotid artery stenting (left TCAR on 05/26/2020 and a right TCAR on 07/07/2020).  This is a 32-month office visit.  Discussed the use of AI scribe software for clinical note transcription with the patient, who gave verbal consent to proceed.  History of Present Illness   The patient, with a history of heart disease, presents with a concern about his ICD, which has been beeping daily at 9:25 AM for the past week. The patient reports no recent pacemaker transmissions since 01/12/2023. Aside from the ICD issue, the patient reports stable shortness of breath and back pain. The patient's weight has been stable, with a slight gain over the holidays. The patient's diabetes is managed, with a recent A1C of 8.1%, which is an improvement from previous levels of 12-13%. The patient's kidney function has also improved.      Labs    External Labs:  Labs 12/13/2022:  Vitamin D 38.2.  A1c 8.1%.  Total cholesterol 139, triglycerides 142, HDL 43, LDL 69.  BUN 24, creatinine 1.3, EGFR 61 mL, potassium 4.8.  LFTs normal.  Hb 14.0/HCT 41.8, platelets 100 80K.  Review of Systems  Cardiovascular:  Negative for chest pain, dyspnea on exertion and leg swelling.   Physical Exam:   VS:  BP 116/66 (BP Location: Left Arm, Patient Position: Sitting, Cuff Size: Large)   Pulse 67   Resp 16   Ht 5'  7" (1.702 m)   Wt 213 lb 12.8 oz (97 kg)   SpO2 92%   BMI 33.49 kg/m    Wt Readings from Last 3 Encounters:  02/16/23 213 lb 12.8 oz (97 kg)  06/03/22 207 lb 6.4 oz (94.1 kg)  01/28/22 207 lb 3.2 oz (94 kg)   Physical Exam Neck:     Vascular: No carotid bruit or JVD.  Cardiovascular:     Rate and Rhythm: Normal rate and regular rhythm.     Pulses: Intact distal pulses.          Dorsalis pedis pulses are 0 on the right side and 0 on the left side.       Posterior tibial pulses are 0 on the right side and 0 on the left side.     Heart sounds: Normal heart sounds. No murmur heard.    No gallop.  Pulmonary:     Effort: Pulmonary effort is normal.     Breath sounds: Normal breath sounds.  Abdominal:     General: Bowel sounds are normal.     Palpations: Abdomen is soft.  Musculoskeletal:     Right lower leg: No edema.     Left lower leg: No edema.     Studies Reviewed: [name] Kitchen    Coronary angiogram 12/11/2019: 3.5 x 20 in LM and proximal CX and 3.0 x 20 mm in mid to distal dominant CX with Synergy DES widely patent.   Echocardiogram 05/24/2022: Severely depressed LV  systolic function with visual EF 25-30%. Left ventricle cavity is normal in size. Mild concentric hypertrophy of the left ventricle. Hypokinetic global wall motion. Doppler evidence of grade II (pseudonormal) diastolic dysfunction, elevated LAP. Calculated EF 25%. Left atrial cavity is mildly dilated. Structurally normal trileaflet aortic valve.  Trace aortic regurgitation. Trace mitral regurgitation. Mild calcification of the mitral valve annulus. Structurally normal tricuspid valve with no regurgitation. No evidence of pulmonary hypertension. Compared to 02/2021, DD is now grade II and LVEF is slightly worsened.  Carotid artery duplex 07/07/2022: There is a patent stent in the right carotid from the ICA-prox to the ICA-mid. There is a patent stent in the left carotid from the ICA-prox to the ICA-mid. The bifurcation,  internal, external and common carotid arteries reveal no evidence of significant stenosis, bilaterally. Antegrade right vertebral artery flow. Antegrade left vertebral artery flow. Compared to the study (report only) done on 10/06/2021, no significant change.  EKG:    EKG Interpretation Date/Time:  Wednesday February 16 2023 15:47:05 EST Ventricular Rate:  69 PR Interval:  166 QRS Duration:  168 QT Interval:  496 QTC Calculation: 531 R Axis:   249  Text Interpretation: EKG 02/16/2023: Atrially paced and biventricular paced rhythm at rate of 69 bpm.  No significant change from 11/24/2021. Confirmed by Delrae Rend [phone]) on 02/16/2023 4:09:53 PM    EKG 12/03/2021: AV paced rhythm underlying sinus rhythm at rate of 67 bpm.   Medications and allergies    Allergies  Allergen Reactions   Diltiazem Rash     Current Outpatient Medications:    amiodarone (PACERONE) 200 MG tablet, Take 0.5 tablets (100 mg total) by mouth daily., Disp: 45 tablet, Rfl: 3   amLODipine (NORVASC) 5 MG tablet, TAKE 1 TABLET BY MOUTH DAILY, Disp: 90 tablet, Rfl: 2   aspirin EC 81 MG EC tablet, Take 1 tablet (81 mg total) by mouth daily., Disp:  , Rfl:    carvedilol (COREG) 25 MG tablet, Take 1 tablet (25 mg total) by mouth 2 (two) times daily with a meal., Disp: 180 tablet, Rfl: 3   Cholecalciferol (VITAMIN D3) 50 MCG (2000 UT) TABS, Take 2,000 Units by mouth 2 (two) times daily. , Disp: , Rfl:    Cyanocobalamin 2500 MCG CHEW, Chew 2,500 mcg by mouth daily. , Disp: , Rfl:    empagliflozin (JARDIANCE) 25 MG TABS tablet, Take 1 tablet (25 mg total) by mouth daily before breakfast., Disp: 90 tablet, Rfl: 3   ferrous sulfate 325 (65 FE) MG tablet, Take 325 mg by mouth daily., Disp: , Rfl:    furosemide (LASIX) 20 MG tablet, Take 20 mg by mouth daily., Disp: , Rfl:    glucose 4 GM chewable tablet, , Disp: , Rfl:    insulin isophane & regular human (NOVOLIN 70/30 FLEXPEN) (70-30) 100 UNIT/ML KwikPen, Inject 30-63  Units into the skin 2 (two) times daily as needed (high blood sugar)., Disp: , Rfl:    metFORMIN (GLUCOPHAGE-XR) 500 MG 24 hr tablet, Take 500 mg by mouth 2 (two) times daily., Disp: , Rfl:    nitroGLYCERIN (NITROSTAT) 0.4 MG SL tablet, Place 1 tablet (0.4 mg total) under the tongue every 5 (five) minutes x 3 doses as needed for chest pain., Disp: 25 tablet, Rfl: 3   Omega-3 Fatty Acids (FISH OIL) 1000 MG CAPS, Take 2,000 mg by mouth 3 (three) times daily., Disp: , Rfl:    OXYGEN, Inhale 2 L/min into the lungs See admin instructions. 2 L/min at bedtime  in conjunction with CPAP , Disp: , Rfl:    PRESCRIPTION MEDICATION, Inhale into the lungs See admin instructions. CPAP- At bedtime, Disp: , Rfl:    rosuvastatin (CRESTOR) 40 MG tablet, Take 1 tablet (40 mg total) by mouth at bedtime., Disp:  , Rfl:    sacubitril-valsartan (ENTRESTO) 49-51 MG, Take 1 tablet by mouth 2 (two) times daily., Disp: 60 tablet, Rfl: 0   Semaglutide, 1 MG/DOSE, 2 MG/1.5ML SOPN, Inject 2 mg into the skin every Sunday., Disp: , Rfl:    sertraline (ZOLOFT) 50 MG tablet, Take 50 mg by mouth daily., Disp: , Rfl:    torsemide (DEMADEX) 20 MG tablet, TAKE 1 TABLET BY MOUTH ONCE A DAY AS NEEDED (FOR AN OVERNIGHT WEIGHT GAIN OF THREE POUNDS OR MORE), Disp: 90 tablet, Rfl: 2   ASSESSMENT AND PLAN: .      ICD-10-CM   1. Coronary artery disease involving native coronary artery of native heart without angina pectoris  I25.10 EKG 12-Lead    2. Chronic combined systolic and diastolic heart failure (HCC)  Z61.09     3. Primary hypertension  I10     4. Hypercholesteremia  E78.00     5. Internal carotid artery stent present  Z95.828 VAS US CAROTID     Assessment and Plan  Chronic systolic heart failure No recent hospitalization, he appears to be well compensated.  ICD evaluation does not reveal fluid overload state.  Clinically there is no JVD, no leg edema.  Due to recurrent episodes of ventricular tachycardia, presently on  amiodarone which she is tolerating.  Presently on 100 mg daily. Cardiac medications include Jardiance, valsartan moderate dose, torsemide 20 mg once a day as directed on a as needed basis for leg edema and dyspnea, carvedilol 25 mg twice daily and amiodarone 200 mg half tablet daily.  CAD of the native vessel without angina pectoris He has not had any recurrence of dyspnea, palpitations or orthopnea or heart failure.  Implantable Cardioverter Defibrillator (ICD) Daily beeping at 9:25 AM for the past week. ICD has reached end of life (EOL) and requires generator change. -Disable alert today. -Schedule generator change.  Hyperlipidemia Total cholesterol 139, triglycerides 142, HDL 43, LDL 69. Stable lipid profile. -Continue current management.  Diabetes Mellitus A1c 8.1%, improved from previous 12-13%.  Continue Jardiance, 70/30 insulin and metformin.  He is also on semaglutide 1 mg weekly. -Continue current management.  Weight loss and calorie restriction again discussed with the patient.  He has gained about 5 pounds in weight since holidays.  Renal Function Improved creatinine 1.3, eGFR 61 mL.  Labs reviewed. -Continue current management.  Carotid Artery Disease No current symptoms or signs of disease.  -Plan carotid ultrasound in 6 months.  He has had mild disease in the past, will do surveillance.  Follow-up in 6 months.    Signed,  Yates Decamp, MD, [facility] 02/16/2023, 10:27 PM [facility] [address] [place], [place] [address] Phone: [phone]. Fax:  [phone]

## 2023-02-16 NOTE — Patient Instructions (Signed)
Medication Instructions:  Your physician recommends that you continue on your current medications as directed. Please refer to the Current Medication list given to you today.  *If you need a refill on your cardiac medications before your next appointment, please call your pharmacy*   Lab Work: none If you have labs (blood work) drawn today and your tests are completely normal, you will receive your results only by: MyChart Message (if you have MyChart) OR A paper copy in the mail If you have any lab test that is abnormal or we need to change your treatment, we will call you to review the results.   Testing/Procedures: Your physician has requested that you have a carotid duplex in 6 months. . This test is an ultrasound of the carotid arteries in your neck. It looks at blood flow through these arteries that supply the brain with blood. Allow one hour for this exam. There are no restrictions or special instructions.    Follow-Up: At [facility], you and your health needs are our priority.  As part of our continuing mission to provide you with exceptional heart care, we have created designated Provider Care Teams.  These Care Teams include your primary Cardiologist (physician) and Advanced Practice Providers (APPs -  Physician Assistants and Nurse Practitioners) who all work together to provide you with the care you need, when you need it.  We recommend signing up for the patient portal called "MyChart".  Sign up information is provided on this After Visit Summary.  MyChart is used to connect with patients for Virtual Visits (Telemedicine).  Patients are able to view lab/test results, encounter notes, upcoming appointments, etc.  Non-urgent messages can be sent to your provider as well.   To learn more about what you can do with MyChart, go to [email or web address].    Your next appointment:   6 month(s)  (after carotid doppler)  Provider:   Yates Decamp, MD     Other  Instructions   Follow up with Dr Jimmey Ralph as planned

## 2023-02-18 NOTE — Addendum Note (Signed)
Addended by: Geralyn Flash D on: 02/18/2023 01:27 PM   Modules accepted: Orders

## 2023-02-18 NOTE — Progress Notes (Signed)
Remote ICD transmission.

## 2023-02-28 ENCOUNTER — Encounter: Payer: Self-pay | Admitting: Cardiology

## 2023-02-28 ENCOUNTER — Ambulatory Visit: Payer: No Typology Code available for payment source | Attending: Cardiology | Admitting: Cardiology

## 2023-02-28 VITALS — BP 118/68 | HR 84 | Ht 67.0 in | Wt 211.6 lb

## 2023-02-28 DIAGNOSIS — K838 Other specified diseases of biliary tract: Secondary | ICD-10-CM

## 2023-02-28 DIAGNOSIS — I5042 Chronic combined systolic (congestive) and diastolic (congestive) heart failure: Secondary | ICD-10-CM

## 2023-02-28 DIAGNOSIS — Z4502 Encounter for adjustment and management of automatic implantable cardiac defibrillator: Secondary | ICD-10-CM

## 2023-02-28 DIAGNOSIS — I472 Ventricular tachycardia, unspecified: Secondary | ICD-10-CM | POA: Diagnosis not present

## 2023-02-28 DIAGNOSIS — Z95810 Presence of automatic (implantable) cardiac defibrillator: Secondary | ICD-10-CM | POA: Diagnosis not present

## 2023-02-28 LAB — CUP PACEART INCLINIC DEVICE CHECK
Date Time Interrogation Session: 20250203165519
Implantable Lead Connection Status: 753985
Implantable Lead Connection Status: 753985
Implantable Lead Connection Status: 753985
Implantable Lead Implant Date: 20161024
Implantable Lead Implant Date: 20161024
Implantable Lead Implant Date: 20161024
Implantable Lead Location: 753858
Implantable Lead Location: 753859
Implantable Lead Location: 753860
Implantable Lead Model: 4598
Implantable Lead Model: 5076
Implantable Pulse Generator Implant Date: 20161024

## 2023-02-28 NOTE — H&P (View-Only) (Signed)
Electrophysiology Office Note:   Date:  03/01/2023  ID:  Martin Bush, DOB [birth year], MRN [id number]  Primary Cardiologist: Yates Decamp, MD Electrophysiologist: Nobie Putnam, MD      History of Present Illness:   Martin Bush is a 68 y.o. male with h/o CAD and history of CABG x2 in 2002 with only LIMA to LAD being patent, PCI to left main into superdominant LCx on 10/09/2019, chronic systolic heart failure secondary to ischemic cardiomyopathy, recurrent sustained VT s/p CRT-D implantation, hypertension, mixed hyperlipidemia, controlled diabetes mellitus with stage IIIa chronic kidney disease and bilateral carotid artery stenting (left TCAR on 05/26/2020 and a right TCAR on 07/07/2020) who is being seen today to establish care for his ICD.   Discussed the use of AI scribe software for clinical note transcription with the patient, who gave verbal consent to proceed.  History of Present Illness   The patient, with a history of cardiac disease managed with a CRT-D, presents for a routine follow-up. The device was implanted in 2016 and has been functioning well. The patient reports one ICD shock from the device a couple of years ago following a coughing fit, but no recent shocks. The patient is also on amiodarone to prevent dangerous heart rhythms. The patient has not reported any recent hospitalizations or symptoms of heart failure such as fluid in the legs, worsening shortness of breath. He has no new or acute complaints.     Review of systems complete and found to be negative unless listed in HPI.   EP Information / Studies Reviewed:    EKG is ordered today. Personal review as below. AV paced rhythm. BiV paced morphology.     Echo 11/24/21:  1. Left ventricular ejection fraction, by estimation, is 35 to 40%. Left  ventricular ejection fraction by 2D MOD biplane is 30.8 %. The left  ventricle has moderately decreased function. The left ventricle  demonstrates global hypokinesis. The  left  ventricular internal cavity size was moderately dilated. Left ventricular  diastolic parameters are consistent with Grade II diastolic dysfunction  (pseudonormalization). Elevated left ventricular end-diastolic pressure.  There is akinesis of the left  ventricular, apical segment.   2. Right ventricular systolic function is normal. The right ventricular  size is normal. Tricuspid regurgitation signal is inadequate for assessing  PA pressure.   3. Left atrial size was moderately dilated.   4. The mitral valve is normal in structure. Trivial mitral valve  regurgitation. No evidence of mitral stenosis.   5. The aortic valve is normal in structure. Aortic valve regurgitation is  trivial. No aortic stenosis is present.   6. The inferior vena cava is normal in size with <50% respiratory  variability, suggesting right atrial pressure of 8 mmHg.    Physical Exam:   VS:  BP 118/68   Pulse 84   Ht 5\' 7"  (1.702 m)   Wt 211 lb 9.6 oz (96 kg)   SpO2 96%   BMI 33.14 kg/m    Wt Readings from Last 3 Encounters:  02/28/23 211 lb 9.6 oz (96 kg)  02/16/23 213 lb 12.8 oz (97 kg)  06/03/22 207 lb 6.4 oz (94.1 kg)     GEN: Well nourished, well developed in no acute distress NECK: No JVD CARDIAC: Normal rate, regular RESPIRATORY:  Clear to auscultation without rales, wheezing or rhonchi  ABDOMEN: Soft, non-distended EXTREMITIES:  No edema; No deformity   ASSESSMENT AND PLAN:   Martin Bush is a 68 y.o. male with  h/o CAD and history of CABG x2 in 2002 with only LIMA to LAD being patent, PCI to left main into superdominant LCx on 10/09/2019, chronic systolic heart failure secondary to ischemic cardiomyopathy, recurrent sustained VT s/p CRT-D implantation, hypertension, mixed hyperlipidemia, controlled diabetes mellitus with stage IIIa chronic kidney disease and bilateral carotid artery stenting (left TCAR on 05/26/2020 and a right TCAR on 07/07/2020) who is being seen today to establish care  for his ICD.   #CRT-D at RRT: Implanted 2016. Reached RRT 02/01/23. - Will schedule for generator change. Explained risks, benefits, and alternatives to CRT-D generator change, including but not limited to bleeding, infection.  Pt verbalized understanding and agrees to proceed. - In clinic device check performed, appropriate function and stable lead parameters.  - BiV pacing 98.2% - No recent VT/VF episodes.  #Sustained VT:  - Continue amiodarone and carvedilol. - No recent VT/VF episodes on device interrogation. Reports history of 1 shock years ago.  #Chronic systolic heart failure:  #Ischemic cardiomyopathy: #Multivessel CAD: - Appears well compensated. No new complaints.  - Continue medical management and close follow up with primary cardiologist.  Signed, Nobie Putnam, MD

## 2023-02-28 NOTE — Progress Notes (Unsigned)
Electrophysiology Office Note:   Date:  03/01/2023  ID:  Martin Bush, DOB 1955-06-11, MRN [id number]  Primary Cardiologist: Yates Decamp, MD Electrophysiologist: Nobie Putnam, MD      History of Present Illness:   Martin Bush is a 68 y.o. male with h/o CAD and history of CABG x2 in 2002 with only LIMA to LAD being patent, PCI to left main into superdominant LCx on 10/09/2019, chronic systolic heart failure secondary to ischemic cardiomyopathy, recurrent sustained VT s/p CRT-D implantation, hypertension, mixed hyperlipidemia, controlled diabetes mellitus with stage IIIa chronic kidney disease and bilateral carotid artery stenting (left TCAR on 05/26/2020 and a right TCAR on 07/07/2020) who is being seen today to establish care for his ICD.   Discussed the use of AI scribe software for clinical note transcription with the patient, who gave verbal consent to proceed.  History of Present Illness   The patient, with a history of cardiac disease managed with a CRT-D, presents for a routine follow-up. The device was implanted in 2016 and has been functioning well. The patient reports one ICD shock from the device a couple of years ago following a coughing fit, but no recent shocks. The patient is also on amiodarone to prevent dangerous heart rhythms. The patient has not reported any recent hospitalizations or symptoms of heart failure such as fluid in the legs, worsening shortness of breath. He has no new or acute complaints.     Review of systems complete and found to be negative unless listed in HPI.   EP Information / Studies Reviewed:    EKG is ordered today. Personal review as below. AV paced rhythm. BiV paced morphology.     Echo 11/24/21:  1. Left ventricular ejection fraction, by estimation, is 35 to 40%. Left  ventricular ejection fraction by 2D MOD biplane is 30.8 %. The left  ventricle has moderately decreased function. The left ventricle  demonstrates global hypokinesis. The  left  ventricular internal cavity size was moderately dilated. Left ventricular  diastolic parameters are consistent with Grade II diastolic dysfunction  (pseudonormalization). Elevated left ventricular end-diastolic pressure.  There is akinesis of the left  ventricular, apical segment.   2. Right ventricular systolic function is normal. The right ventricular  size is normal. Tricuspid regurgitation signal is inadequate for assessing  PA pressure.   3. Left atrial size was moderately dilated.   4. The mitral valve is normal in structure. Trivial mitral valve  regurgitation. No evidence of mitral stenosis.   5. The aortic valve is normal in structure. Aortic valve regurgitation is  trivial. No aortic stenosis is present.   6. The inferior vena cava is normal in size with <50% respiratory  variability, suggesting right atrial pressure of 8 mmHg.    Physical Exam:   VS:  BP 118/68   Pulse 84   Ht 5\' 7"  (1.702 m)   Wt 211 lb 9.6 oz (96 kg)   SpO2 96%   BMI 33.14 kg/m    Wt Readings from Last 3 Encounters:  02/28/23 211 lb 9.6 oz (96 kg)  02/16/23 213 lb 12.8 oz (97 kg)  06/03/22 207 lb 6.4 oz (94.1 kg)     GEN: Well nourished, well developed in no acute distress NECK: No JVD CARDIAC: Normal rate, regular RESPIRATORY:  Clear to auscultation without rales, wheezing or rhonchi  ABDOMEN: Soft, non-distended EXTREMITIES:  No edema; No deformity   ASSESSMENT AND PLAN:   Martin Bush is a 68 y.o. male with  h/o CAD and history of CABG x2 in 2002 with only LIMA to LAD being patent, PCI to left main into superdominant LCx on 10/09/2019, chronic systolic heart failure secondary to ischemic cardiomyopathy, recurrent sustained VT s/p CRT-D implantation, hypertension, mixed hyperlipidemia, controlled diabetes mellitus with stage IIIa chronic kidney disease and bilateral carotid artery stenting (left TCAR on 05/26/2020 and a right TCAR on 07/07/2020) who is being seen today to establish care  for his ICD.   #CRT-D at RRT: Implanted 2016. Reached RRT 02/01/23. - Will schedule for generator change. Explained risks, benefits, and alternatives to CRT-D generator change, including but not limited to bleeding, infection.  Pt verbalized understanding and agrees to proceed. - In clinic device check performed, appropriate function and stable lead parameters.  - BiV pacing 98.2% - No recent VT/VF episodes.  #Sustained VT:  - Continue amiodarone and carvedilol. - No recent VT/VF episodes on device interrogation. Reports history of 1 shock years ago.  #Chronic systolic heart failure:  #Ischemic cardiomyopathy: #Multivessel CAD: - Appears well compensated. No new complaints.  - Continue medical management and close follow up with primary cardiologist.  Signed, Nobie Putnam, MD

## 2023-02-28 NOTE — Patient Instructions (Signed)
Medication Instructions:  Your physician recommends that you continue on your current medications as directed. Please refer to the Current Medication list given to you today.  *If you need a refill on your cardiac medications before your next appointment, please call your pharmacy*  Lab Work: TODAY: BMET and CBC If you have labs (blood work) drawn today and your tests are completely normal, you will receive your results only by: MyChart Message (if you have MyChart) OR A paper copy in the mail If you have any lab test that is abnormal or we need to change your treatment, we will call you to review the results.  Testing/Procedures: ICD generator change - see instruction letter  Follow-Up: At [facility], you and your health needs are our priority.  As part of our continuing mission to provide you with exceptional heart care, we have created designated Provider Care Teams.  These Care Teams include your primary Cardiologist (physician) and Advanced Practice Providers (APPs -  Physician Assistants and Nurse Practitioners) who all work together to provide you with the care you need, when you need it.   Your next appointment:   We will call you to schedule your follow up appointments

## 2023-03-01 LAB — CBC WITH DIFFERENTIAL/PLATELET
Basophils Absolute: 0.1 10*3/uL (ref 0.0–0.2)
Basos: 1 %
EOS (ABSOLUTE): 0.2 10*3/uL (ref 0.0–0.4)
Eos: 3 %
Hematocrit: 43.8 % (ref 37.5–51.0)
Hemoglobin: 14.2 g/dL (ref 13.0–17.7)
Immature Grans (Abs): 0 10*3/uL (ref 0.0–0.1)
Immature Granulocytes: 1 %
Lymphocytes Absolute: 1.3 10*3/uL (ref 0.7–3.1)
Lymphs: 17 %
MCH: 30.5 pg (ref 26.6–33.0)
MCHC: 32.4 g/dL (ref 31.5–35.7)
MCV: 94 fL (ref 79–97)
Monocytes Absolute: 0.7 10*3/uL (ref 0.1–0.9)
Monocytes: 9 %
Neutrophils Absolute: 5.5 10*3/uL (ref 1.4–7.0)
Neutrophils: 69 %
Platelets: 172 10*3/uL (ref 150–450)
RBC: 4.65 x10E6/uL (ref 4.14–5.80)
RDW: 12.4 % (ref 11.6–15.4)
WBC: 7.9 10*3/uL (ref 3.4–10.8)

## 2023-03-01 LAB — BASIC METABOLIC PANEL
BUN/Creatinine Ratio: 16 (ref 10–24)
BUN: 24 mg/dL (ref 8–27)
CO2: 23 mmol/L (ref 20–29)
Calcium: 9.7 mg/dL (ref 8.6–10.2)
Chloride: 104 mmol/L (ref 96–106)
Creatinine, Ser: 1.48 mg/dL — ABNORMAL HIGH (ref 0.76–1.27)
Glucose: 169 mg/dL — ABNORMAL HIGH (ref 70–99)
Potassium: 5.1 mmol/L (ref 3.5–5.2)
Sodium: 142 mmol/L (ref 134–144)
eGFR: 52 mL/min/{1.73_m2} — ABNORMAL LOW (ref >59)

## 2023-03-07 NOTE — Pre-Procedure Instructions (Signed)
Attempted to call patient regarding procedure instructions.  Left voice mail on the following items: Arrival time 1230 Nothing to eat or drink after midnight No meds AM of procedure Responsible person to drive you home and stay with you for 24 hrs Wash with special soap night before and morning of procedure  

## 2023-03-08 ENCOUNTER — Encounter (HOSPITAL_COMMUNITY)
Admission: RE | Disposition: A | Discharge status: Home / Self Care | Payer: No Typology Code available for payment source | Source: Ambulatory Visit | Attending: Cardiology

## 2023-03-08 ENCOUNTER — Ambulatory Visit (HOSPITAL_COMMUNITY)
Admission: RE | Admit: 2023-03-08 | Discharge: 2023-03-08 | Disposition: A | Discharge status: Home / Self Care | Payer: No Typology Code available for payment source | Source: Ambulatory Visit | Attending: Cardiology | Admitting: Cardiology

## 2023-03-08 ENCOUNTER — Other Ambulatory Visit: Payer: Self-pay

## 2023-03-08 ENCOUNTER — Encounter (HOSPITAL_COMMUNITY): Payer: Self-pay | Admitting: Cardiology

## 2023-03-08 DIAGNOSIS — Z4502 Encounter for adjustment and management of automatic implantable cardiac defibrillator: Secondary | ICD-10-CM | POA: Insufficient documentation

## 2023-03-08 DIAGNOSIS — I2510 Atherosclerotic heart disease of native coronary artery without angina pectoris: Secondary | ICD-10-CM | POA: Diagnosis not present

## 2023-03-08 DIAGNOSIS — I130 Hypertensive heart and chronic kidney disease with heart failure and stage 1 through stage 4 chronic kidney disease, or unspecified chronic kidney disease: Secondary | ICD-10-CM | POA: Diagnosis not present

## 2023-03-08 DIAGNOSIS — I255 Ischemic cardiomyopathy: Secondary | ICD-10-CM | POA: Insufficient documentation

## 2023-03-08 DIAGNOSIS — N1831 Chronic kidney disease, stage 3a: Secondary | ICD-10-CM | POA: Insufficient documentation

## 2023-03-08 DIAGNOSIS — I5022 Chronic systolic (congestive) heart failure: Secondary | ICD-10-CM | POA: Diagnosis not present

## 2023-03-08 DIAGNOSIS — I472 Ventricular tachycardia, unspecified: Secondary | ICD-10-CM | POA: Insufficient documentation

## 2023-03-08 DIAGNOSIS — E1122 Type 2 diabetes mellitus with diabetic chronic kidney disease: Secondary | ICD-10-CM | POA: Diagnosis not present

## 2023-03-08 DIAGNOSIS — Z79899 Other long term (current) drug therapy: Secondary | ICD-10-CM | POA: Insufficient documentation

## 2023-03-08 HISTORY — PX: ICD GENERATOR CHANGEOUT: EP1231

## 2023-03-08 LAB — GLUCOSE, CAPILLARY: Glucose-Capillary: 169 mg/dL — ABNORMAL HIGH (ref 70–99)

## 2023-03-08 SURGERY — ICD GENERATOR CHANGEOUT

## 2023-03-08 MED ORDER — LIDOCAINE HCL (PF) 1 % IJ SOLN
INTRAMUSCULAR | Status: DC | PRN
  Administered 2023-03-08: 60 mL

## 2023-03-08 MED ORDER — SODIUM CHLORIDE 0.9 % IV SOLN: INTRAVENOUS | Status: DC

## 2023-03-08 MED ORDER — CEFAZOLIN SODIUM-DEXTROSE 2-4 GM/100ML-% IV SOLN
INTRAVENOUS | Status: AC
  Filled 2023-03-08: qty 100

## 2023-03-08 MED ORDER — FENTANYL CITRATE (PF) 100 MCG/2ML IJ SOLN
INTRAMUSCULAR | Status: DC | PRN
  Administered 2023-03-08: 50 ug via INTRAVENOUS

## 2023-03-08 MED ORDER — ACETAMINOPHEN 325 MG PO TABS: 325.0000 mg | ORAL_TABLET | ORAL | Status: DC | PRN

## 2023-03-08 MED ORDER — MIDAZOLAM HCL 2 MG/2ML IJ SOLN
INTRAMUSCULAR | Status: AC
  Filled 2023-03-08: qty 2

## 2023-03-08 MED ORDER — CHLORHEXIDINE GLUCONATE 4 % EX SOLN
4.0000 | Freq: Once | CUTANEOUS | Status: DC
  Filled 2023-03-08: qty 60

## 2023-03-08 MED ORDER — SODIUM CHLORIDE 0.9 % IV SOLN
INTRAVENOUS | Status: AC
  Filled 2023-03-08: qty 2

## 2023-03-08 MED ORDER — CEFAZOLIN SODIUM-DEXTROSE 2-4 GM/100ML-% IV SOLN
2.0000 g | INTRAVENOUS | Status: AC
  Administered 2023-03-08: 2 g via INTRAVENOUS

## 2023-03-08 MED ORDER — SODIUM CHLORIDE 0.9 % IV SOLN
80.0000 mg | INTRAVENOUS | Status: AC
  Administered 2023-03-08: 80 mg

## 2023-03-08 MED ORDER — MIDAZOLAM HCL 5 MG/5ML IJ SOLN
INTRAMUSCULAR | Status: DC | PRN
  Administered 2023-03-08: 1 mg via INTRAVENOUS

## 2023-03-08 MED ORDER — ONDANSETRON HCL 4 MG/2ML IJ SOLN: 4.0000 mg | Freq: Four times a day (QID) | INTRAMUSCULAR | Status: DC | PRN

## 2023-03-08 MED ORDER — POVIDONE-IODINE 10 % EX SWAB
2.0000 | Freq: Once | CUTANEOUS | Status: AC
  Administered 2023-03-08: 2 via TOPICAL

## 2023-03-08 MED ORDER — FENTANYL CITRATE (PF) 100 MCG/2ML IJ SOLN
INTRAMUSCULAR | Status: AC
  Filled 2023-03-08: qty 2

## 2023-03-08 MED ORDER — LIDOCAINE HCL (PF) 1 % IJ SOLN
INTRAMUSCULAR | Status: AC
  Filled 2023-03-08: qty 60

## 2023-03-08 SURGICAL SUPPLY — 6 items
CABLE SURGICAL S-101-97-12 (CABLE) ×1 IMPLANT
ELECT DEFIB PAD ADLT CADENCE (PAD) IMPLANT
ICD COBALT XT QUAD CRT DTPA2QQ (ICD Generator) IMPLANT
POUCH AIGIS-R ANTIBACT ICD (Mesh General) ×1 IMPLANT
POUCH AIGIS-R ANTIBACT ICD LRG (Mesh General) IMPLANT
TRAY PACEMAKER INSERTION (PACKS) ×1 IMPLANT

## 2023-03-08 NOTE — Interval H&P Note (Signed)
History and Physical Interval Note:  03/08/2023 1:14 PM  Martin Bush  has presented today for surgery, with the diagnosis of ICD device at [facility].  The various methods of treatment have been discussed with the patient and family. After consideration of risks, benefits and other options for treatment, the patient has consented to  Procedure(s): ICD GENERATOR CHANGEOUT (N/A) as a surgical intervention.  The patient's history has been reviewed, patient examined, no change in status, stable for surgery.  I have reviewed the patient's chart and labs.  Questions were answered to the patient's satisfaction.     Nobie Putnam

## 2023-03-08 NOTE — Discharge Instructions (Signed)

## 2023-03-09 ENCOUNTER — Telehealth (HOSPITAL_COMMUNITY): Payer: Self-pay

## 2023-03-09 MED FILL — Midazolam HCl Inj 2 MG/2ML (Base Equivalent): INTRAMUSCULAR | Qty: 1 | Status: AC

## 2023-03-09 NOTE — Telephone Encounter (Signed)
Spoke with patient's wife, Harriett Sine to complete post procedure follow up call.   She reports no complications with incision site. Patient had soreness last night that was relieved with Tylenol.   Instructions reviewed:   Remove the large square bandage on the chest after 24 hours from procedure.   If your incision is sealed with Steri-strips or staples, you may shower 7 days after your procedure or when told by your provider. Do not remove the steri-strips or let the shower hit directly on your site. You may wash around your site with soap and water It is common to have bruising AND soreness around incision site. Call the device clinic at [phone] if you notice any signs of infection: fever, redness, swelling, pus drainage, foul odor or increased pain at incision. Any questions regarding activity restrictions? NO  Patient will follow up with the [facility] Device clinic on 03/23/23 and with the APP 91 days after procedure on 06/15/23.    Patient's wife verbalized understanding to all instructions provided.

## 2023-03-23 ENCOUNTER — Ambulatory Visit: Payer: No Typology Code available for payment source | Attending: Cardiovascular Disease

## 2023-03-23 DIAGNOSIS — I472 Ventricular tachycardia, unspecified: Secondary | ICD-10-CM | POA: Diagnosis not present

## 2023-03-23 NOTE — Patient Instructions (Signed)
   After Your ICD (Implantable Cardiac Defibrillator)    Monitor your defibrillator site for redness, swelling, and drainage. Call the device clinic at [phone] if you experience these symptoms or fever/chills.  Your incision was closed with Steri-strips or staples:  You may shower 7 days after your procedure and wash your incision with soap and water. Avoid lotions, ointments, or perfumes over your incision until it is well-healed.  You may use a hot tub or a pool after your wound check appointment if the incision is completely closed.   There are no other restrictions in arm movement after your wound check appointment.  Your ICD is designed to protect you from life threatening heart rhythms. Because of this, you may receive a shock.   1 shock with no symptoms:  Call the office during business hours. 1 shock with symptoms (chest pain, chest pressure, dizziness, lightheadedness, shortness of breath, overall feeling unwell):  Call 911. If you experience 2 or more shocks in 24 hours:  Call 911. If you receive a shock, you should not drive.  [place] DMV - no driving for 6 months if you receive appropriate therapy from your ICD.   ICD Alerts:  Some alerts are vibratory and others beep. These are NOT emergencies. Please call our office to let us know. If this occurs at night or on weekends, it can wait until the next business day. Send a remote transmission.  If your device is capable of reading fluid status (for heart failure), you will be offered monthly monitoring to review this with you.   Remote monitoring is used to monitor your ICD from home. This monitoring is scheduled every 91 days by our office. It allows [place] to keep an eye on the functioning of your device to ensure it is working properly. You will routinely see your Electrophysiologist annually (more often if necessary).

## 2023-03-23 NOTE — Progress Notes (Unsigned)
 Today

## 2023-03-24 LAB — CUP PACEART INCLINIC DEVICE CHECK
Date Time Interrogation Session: 20250226165410
Implantable Lead Connection Status: 753985
Implantable Lead Connection Status: 753985
Implantable Lead Connection Status: 753985
Implantable Lead Implant Date: 20161024
Implantable Lead Implant Date: 20161024
Implantable Lead Implant Date: 20161024
Implantable Lead Location: 753858
Implantable Lead Location: 753859
Implantable Lead Location: 753860
Implantable Lead Model: 4598
Implantable Lead Model: 5076
Implantable Pulse Generator Implant Date: 20250211

## 2023-03-28 ENCOUNTER — Encounter: Payer: Self-pay | Admitting: Cardiology

## 2023-03-28 ENCOUNTER — Encounter: Payer: Self-pay | Admitting: Nurse Practitioner

## 2023-03-28 ENCOUNTER — Ambulatory Visit: Payer: No Typology Code available for payment source | Admitting: Nurse Practitioner

## 2023-03-28 ENCOUNTER — Telehealth: Payer: Self-pay

## 2023-03-28 VITALS — BP 124/64 | HR 70 | Ht 67.0 in | Wt 211.0 lb

## 2023-03-28 DIAGNOSIS — E1169 Type 2 diabetes mellitus with other specified complication: Secondary | ICD-10-CM | POA: Diagnosis not present

## 2023-03-28 DIAGNOSIS — E785 Hyperlipidemia, unspecified: Secondary | ICD-10-CM

## 2023-03-28 DIAGNOSIS — Z860100 Personal history of colon polyps, unspecified: Secondary | ICD-10-CM

## 2023-03-28 DIAGNOSIS — I2510 Atherosclerotic heart disease of native coronary artery without angina pectoris: Secondary | ICD-10-CM

## 2023-03-28 DIAGNOSIS — I779 Disorder of arteries and arterioles, unspecified: Secondary | ICD-10-CM

## 2023-03-28 DIAGNOSIS — Z860101 Personal history of adenomatous and serrated colon polyps: Secondary | ICD-10-CM

## 2023-03-28 NOTE — Progress Notes (Addendum)
 03/28/2023 Martin Bush [id number] 04/02/1955   CHIEF COMPLAINT: Schedule a colonoscopy   HISTORY OF PRESENT ILLNESS: Martin Bush is a 68 year old male with a past medical history of depression, arthritis, hypertension, hyperlipidemia, CAD s/p NSTEMI 06/2014, s/p 2 vessel CABG 2002, s/p PCI 09/2019, ischemic cardiomyopathy with recurrent sustained ventricular tachycardia s/p ICD 10/2014, carotid artery disease s/p left carotid stent placement 05/2020 and right carotid stent placement 06/2020, OSA on CPAP, DM type II, CKD stage IIIa, GERD and colon polyps.   He presents to our office today as referred by Dr. Talbert Cage at the [facility] to a schedule colonoscopy.  He underwent a colonoscopy 09/19/2018 by Dr. Orvan Falconer which identified one 3 mm tubular adenomatous polyp removed from the distal sigmoid colon and a large 25 to 30 mm polyp was found in the rectum which was biopsied and tattooed.  Biopsies of the rectal polyp were consistent with villous adenoma negative for high-grade dysplasia.  He subsequently underwent a flexible sigmoidoscopy with EMR of a 40 mm polyp in the distal rectum, clips placed.  Biopsies of the rectal polyp showed multiple fragments of tubular adenomas without dysplasia or malignancy and resection margins free of dysplasia.  A repeat flexible sigmoidoscopy in 6 months was recommended but was not done.  He has chronic constipation and describes passing a large ball of stool every other day.  No bloody or black stools.  He does not take a fiber supplement or laxative.  He denies having any heartburn or dysphagia.  No upper or lower abdominal pain.  He takes aspirin 81 mg once daily.  Not on anticoagulation.  He has a significant history of CAD s/p NSTEMI 06/2024, s/p 2 vessel CABG in [address], s/p PCI 09/2019, ischemic cardiomyopathy with VT s/p initial AICD placement 10/2014. S/P AICD generator replacement 03/08/2023.  He denies having any chest pain, palpitations, dizziness or  shortness of breath.  He was most recently seen by his primary cardiologist Dr. Jacinto Halim 02/16/2023 prior to undergoing AICD generator replacement surgery and his cardiac status was stable at that time.      Latest Ref Rng & Units 02/28/2023    4:44 PM 11/24/2021    2:16 AM 11/24/2021    2:10 AM  CBC  WBC 3.4 - 10.8 x10E3/uL 7.9   16.3   Hemoglobin 13.0 - 17.7 g/dL 69.6  29.5    28.4  13.2   Hematocrit 37.5 - 51.0 % 43.8  38.0    37.0  38.8   Platelets 150 - 450 x10E3/uL 172   279        Latest Ref Rng & Units 02/28/2023    4:44 PM 11/26/2021    2:28 AM 11/25/2021    3:25 AM  CMP  Glucose 70 - 99 mg/dL 440  102  725   BUN 8 - 27 mg/dL 24  48  46   Creatinine 0.76 - 1.27 mg/dL 3.66  4.40  3.47   Sodium 134 - 144 mmol/L 142  137  136   Potassium 3.5 - 5.2 mmol/L 5.1  3.8  4.1   Chloride 96 - 106 mmol/L 104  101  104   CO2 20 - 29 mmol/L 23  26  23    Calcium 8.6 - 10.2 mg/dL 9.7  9.0  9.0     ECHO 05/24/2022: 1. Severely depressed LV systolic function with visual EF 25-30%. Left ventricle cavity is normal in size. Mild concentric hypertrophy of the left ventricle.  Hypokinetic global wall motion. Doppler evidence of grade II (pseudonormal) diastolic dysfunction, elevated LAP. Calculated EF 25%. 2. Left atrial cavity is mildly dilated. 3. Structurally normal trileaflet aortic valve. Trace aortic regurgitation. 4. Trace mitral regurgitation. Mild calcification of the mitral valve annulus. 5. Structurally normal tricuspid valve with no regurgitation. No evidence of pulmonary hypertension. 6. Compared to 02/2021, DD is now grade II and LVEF is slightly worsened  Carotid artery duplex 07/07/2022: There is a patent stent in the right carotid from the ICA-prox to the ICA-mid. There is a patent stent in the left carotid from the ICA-prox to the ICA-mid. The bifurcation, internal, external and common carotid arteries reveal no evidence of significant stenosis, bilaterally. Antegrade right  vertebral artery flow. Antegrade left vertebral artery flow. Compared to the study (report only) done on 10/06/2021, no significant change.  PAST GI PROCEDURES:  EGD 05/17/2008: Normal EGD.  Colonoscopy 4/20 03/2008: Diverticulum found in the sigmoid colon.  Recall colonoscopy 10 years.  Colonoscopy 09/19/2018 by Dr. Orvan Falconer: - Perianal skin tags found on perianal exam.  -One 3 mm polyp in the distal sigmoid colon, removed with a cold snare. Resected and retrieved.  One 25-30 mm polyp in the rectum. Biopsied. Tattooed.  - The examination was otherwise normal on direct and retroflexion views. 1. Colon, polyp(s), sigmoid - TUBULAR ADENOMA, NEGATIVE FOR HIGH GRADE DYSPLASIA. 2. Rectum, polyp(s), umbilicated - VILLOUS ADENOMA, NEGATIVE FOR HIGH GRADE DYSPLASIA.  Flexible sigmoidoscopy by Dr. Meridee Score 12/18/2018: - Hemorrhoids found on digital rectal exam.  - Stool in the entire examined colon.  - One 40 mm polyp in the distal rectum, removed with piecemeal mucosal resection. Resected and retrieved. Treated with a hot snare tip to edge. Clips (MR conditional) were placed.  - Anal papilla.  - Non-bleeding non-thrombosed internal hemorrhoids. -Repeat flexible sigmoidoscopy in 6 months.  A. RECTUM, POLYP, EMR:  -  Multiple fragments of tubular adenomas  -  No high grade dysplasia or malignancy identified  -  Resection margins free of dysplasia    Past Medical History:  Diagnosis Date   Arthritis    "thumbs"  (08/02/2017)   Chronic combined systolic and diastolic heart failure (HCC)    a. 08/2017 Echo: EF 20-25%, mod glob HK. Sev distal ant sept, inflat HK. Apical AK. Gr2 DD. Mildly reduced RV fxn.   Colon polyps    Depression    History of hiatal hernia    Hyperlipidemia    Hypertension    ICD; Biventricular  Medtronic ICD Amplia MRI QWuad CRT-D  in situ 10/29/14 10/29/2014   Ischemic cardiomyopathy 09/27/2018   NSTEMI (non-ST elevated myocardial infarction) (HCC) 07/16/2014   OSA on  CPAP    Peripheral vascular disease (HCC)    Proteinuria    Past Surgical History:  Procedure Laterality Date   BIOPSY  09/19/2018   Procedure: BIOPSY;  Surgeon: Tressia Danas, MD;  Location: [facility] ENDOSCOPY;  Service: Gastroenterology;;   CARDIAC CATHETERIZATION N/A 07/16/2014   Procedure: Left Heart Cath and Coronary Angiography;  Surgeon: Yates Decamp, MD;  Location: [facility] INVASIVE CV LAB;  Service: Cardiovascular;  Laterality: N/A;   CARDIAC CATHETERIZATION  07/16/2014   Procedure: Coronary Balloon Angioplasty;  Surgeon: Yates Decamp, MD;  Location: [facility] INVASIVE CV LAB;  Service: Cardiovascular;;   CARDIAC CATHETERIZATION  2003   CARDIAC DEFIBRILLATOR PLACEMENT  2016   CATARACT EXTRACTION Left 2018   CATARACT EXTRACTION W/ INTRAOCULAR LENS IMPLANT Left 03/2014   COLONOSCOPY     COLONOSCOPY WITH PROPOFOL N/A 09/19/2018  Procedure: COLONOSCOPY WITH PROPOFOL;  Surgeon: Tressia Danas, MD;  Location: [facility] ENDOSCOPY;  Service: Gastroenterology;  Laterality: N/A;   CORONARY ANGIOPLASTY WITH STENT PLACEMENT     "I've got a total of 8 stents in there; mostly doine at [facility]" (08/02/2017)   CORONARY ARTERY BYPASS GRAFT  ~ 2003   "CABG X2"; [facility]   CORONARY ATHERECTOMY N/A 08/16/2017   Procedure: CORONARY ATHERECTOMY;  Surgeon: Elder Negus, MD;  Location: [facility] INVASIVE CV LAB;  Service: Cardiovascular;  Laterality: N/A;   CORONARY BALLOON ANGIOPLASTY N/A 08/04/2017   Procedure: CORONARY BALLOON ANGIOPLASTY;  Surgeon: Yates Decamp, MD;  Location: [facility] INVASIVE CV LAB;  Service: Cardiovascular;  Laterality: N/A;   CORONARY BALLOON ANGIOPLASTY N/A 01/11/2019   Procedure: CORONARY BALLOON ANGIOPLASTY;  Surgeon: Yates Decamp, MD;  Location: [facility] INVASIVE CV LAB;  Service: Cardiovascular;  Laterality: N/A;   CORONARY BALLOON ANGIOPLASTY N/A 10/09/2019   Procedure: CORONARY BALLOON ANGIOPLASTY;  Surgeon: Yates Decamp, MD;  Location: [facility] INVASIVE CV LAB;  Service: Cardiovascular;  Laterality: N/A;    CORONARY STENT INTERVENTION N/A 08/16/2017   Procedure: CORONARY STENT INTERVENTION;  Surgeon: Elder Negus, MD;  Location: [facility] INVASIVE CV LAB;  Service: Cardiovascular;  Laterality: N/A;   CORONARY STENT INTERVENTION N/A 10/09/2019   Procedure: CORONARY STENT INTERVENTION;  Surgeon: Yates Decamp, MD;  Location: [facility] INVASIVE CV LAB;  Service: Cardiovascular;  Laterality: N/A;   ELBOW SURGERY Left ?2001   "pinched nerve"   ENDOSCOPIC MUCOSAL RESECTION  12/18/2018   Procedure: ENDOSCOPIC MUCOSAL RESECTION;  Surgeon: Meridee Score Netty Starring., MD;  Location: [facility] ENDOSCOPY;  Service: Gastroenterology;;   EYE SURGERY Left    cataract removal   FLEXIBLE SIGMOIDOSCOPY N/A 12/18/2018   Procedure: FLEXIBLE SIGMOIDOSCOPY;  Surgeon: Lemar Lofty., MD;  Location: [facility] ENDOSCOPY;  Service: Gastroenterology;  Laterality: N/A;   HEMOSTASIS CLIP PLACEMENT  12/18/2018   Procedure: HEMOSTASIS CLIP PLACEMENT;  Surgeon: Meridee Score Netty Starring., MD;  Location: [facility] ENDOSCOPY;  Service: Gastroenterology;;   ICD GENERATOR CHANGEOUT N/A 03/08/2023   Procedure: ICD GENERATOR CHANGEOUT;  Surgeon: Nobie Putnam, MD;  Location: [facility] INVASIVE CV LAB;  Service: Cardiovascular;  Laterality: N/A;   LEFT HEART CATH AND CORONARY ANGIOGRAPHY N/A 01/11/2019   Procedure: LEFT HEART CATH AND CORONARY ANGIOGRAPHY;  Surgeon: Yates Decamp, MD;  Location: [facility] INVASIVE CV LAB;  Service: Cardiovascular;  Laterality: N/A;   LEFT HEART CATH AND CORS/GRAFTS ANGIOGRAPHY N/A 08/04/2017   Procedure: LEFT HEART CATH AND CORS/GRAFTS ANGIOGRAPHY;  Surgeon: Yates Decamp, MD;  Location: [facility] INVASIVE CV LAB;  Service: Cardiovascular;  Laterality: N/A;   LEFT HEART CATH AND CORS/GRAFTS ANGIOGRAPHY N/A 08/20/2017   Procedure: LEFT HEART CATH AND CORS/GRAFTS ANGIOGRAPHY;  Surgeon: Elder Negus, MD;  Location: [facility] INVASIVE CV LAB;  Service: Cardiovascular;  Laterality: N/A;   LEFT HEART CATH AND CORS/GRAFTS ANGIOGRAPHY N/A 01/08/2019   Procedure:  LEFT HEART CATH AND CORS/GRAFTS ANGIOGRAPHY;  Surgeon: Iran Ouch, MD;  Location: [facility] INVASIVE CV LAB;  Service: Cardiovascular;  Laterality: N/A;   LEFT HEART CATH AND CORS/GRAFTS ANGIOGRAPHY N/A 10/09/2019   Procedure: LEFT HEART CATH AND CORS/GRAFTS ANGIOGRAPHY;  Surgeon: Yates Decamp, MD;  Location: [facility] INVASIVE CV LAB;  Service: Cardiovascular;  Laterality: N/A;   LEFT HEART CATH AND CORS/GRAFTS ANGIOGRAPHY N/A 12/11/2019   Procedure: LEFT HEART CATH AND CORS/GRAFTS ANGIOGRAPHY;  Surgeon: Elder Negus, MD;  Location: [facility] INVASIVE CV LAB;  Service: Cardiovascular;  Laterality: N/A;   POLYPECTOMY  09/19/2018   Procedure: POLYPECTOMY;  Surgeon: Tressia Danas, MD;  Location: [facility] ENDOSCOPY;  Service: Gastroenterology;;   Sunnie Nielsen INJECTION  09/19/2018   Procedure: SUBMUCOSAL INJECTION;  Surgeon: Tressia Danas, MD;  Location: [facility] ENDOSCOPY;  Service: Gastroenterology;;   Brooke Dare INJECTION  12/18/2018   Procedure: SUBMUCOSAL LIFTING INJECTION;  Surgeon: Lemar Lofty., MD;  Location: [facility] ENDOSCOPY;  Service: Gastroenterology;;   TRANSCAROTID ARTERY REVASCULARIZATION  Left 05/26/2020   Procedure: LEFT TRANSCAROTID ARTERY REVASCULARIZATION;  Surgeon: Cephus Shelling, MD;  Location[facility] OR;  Service: Vascular;  Laterality: Left;   TRANSCAROTID ARTERY REVASCULARIZATION  Right 07/07/2020   Procedure: RIGHT TRANSCAROTID ARTERY REVASCULARIZATION;  Surgeon: Cephus Shelling, MD;  Location: [facility] OR;  Service: Vascular;  Laterality: Right;   ULTRASOUND GUIDANCE FOR VASCULAR ACCESS Right 05/26/2020   Procedure: ULTRASOUND GUIDANCE FOR VASCULAR ACCESS;  Surgeon: Cephus Shelling, MD;  Location: [facility] OR;  Service: Vascular;  Laterality: Right;   ULTRASOUND GUIDANCE FOR VASCULAR ACCESS Left 07/07/2020   Procedure: ULTRASOUND GUIDANCE FOR VASCULAR ACCESS;  Surgeon: Cephus Shelling, MD;  Location: [facility] OR;  Service: Vascular;  Laterality: Left;   Social History: He is  married.  He has 2 sons and 1 daughter.  He previously smoked cigars and cigarettes, quit smoking 23 years ago.  No alcohol use.  No drug use.  Family History: Father with history of heart disease, hyperlipidemia, hypertension and diabetes.  Mother with history of uterine and lung cancer.  No known family history of esophageal, gastric or colon cancer.  Allergies  Allergen Reactions   Diltiazem Rash      Outpatient Encounter Medications as of 03/28/2023  Medication Sig   amiodarone (PACERONE) 200 MG tablet Take 0.5 tablets (100 mg total) by mouth daily.   amLODipine (NORVASC) 5 MG tablet TAKE 1 TABLET BY MOUTH DAILY   aspirin EC 81 MG EC tablet Take 1 tablet (81 mg total) by mouth daily.   carvedilol (COREG) 25 MG tablet Take 1 tablet (25 mg total) by mouth 2 (two) times daily with a meal.   Cholecalciferol (VITAMIN D3) 50 MCG (2000 UT) TABS Take 2,000 Units by mouth 2 (two) times daily.    Cyanocobalamin (VITAMIN B-12) 2500 MCG SUBL Take 2,500 mcg by mouth in the morning.   empagliflozin (JARDIANCE) 25 MG TABS tablet Take 1 tablet (25 mg total) by mouth daily before breakfast.   ferrous sulfate 325 (65 FE) MG tablet Take 325 mg by mouth every Monday, Wednesday, and Friday. In the morning   furosemide (LASIX) 20 MG tablet Take 20 mg by mouth in the morning.   glucose 4 GM chewable tablet 1 tablet once as needed for low blood sugar.   insulin isophane & regular human (NOVOLIN 70/30 FLEXPEN) (70-30) 100 UNIT/ML KwikPen Inject 0-30 Units into the skin 2 (two) times daily as needed (high blood sugar).   metFORMIN (GLUCOPHAGE-XR) 500 MG 24 hr tablet Take 500 mg by mouth 2 (two) times daily.   nitroGLYCERIN (NITROSTAT) 0.4 MG SL tablet Place 1 tablet (0.4 mg total) under the tongue every 5 (five) minutes x 3 doses as needed for chest pain.   Omega-3 Fatty Acids (FISH OIL) 1000 MG CAPS Take 2,000 mg by mouth 3 (three) times daily.   OXYGEN Inhale 2 L/min into the lungs See admin instructions. 2  L/min at bedtime in conjunction with CPAP    PRESCRIPTION MEDICATION Inhale into the lungs See admin instructions. CPAP- At bedtime   rosuvastatin (CRESTOR) 40 MG tablet Take 1 tablet (40 mg total)  by mouth at bedtime.   sacubitril-valsartan (ENTRESTO) 49-51 MG Take 1 tablet by mouth 2 (two) times daily.   Semaglutide, 2 MG/DOSE, (OZEMPIC, 2 MG/DOSE,) 8 MG/3ML SOPN Inject 2 mg into the skin every Tuesday.   sertraline (ZOLOFT) 50 MG tablet Take 50 mg by mouth at bedtime.   No facility-administered encounter medications on file as of 03/28/2023.   REVIEW OF SYSTEMS:  Gen: Denies fever, sweats or chills. No weight loss.  CV: Denies chest pain, palpitations or edema. Resp: Denies cough, shortness of breath of hemoptysis.  GI: See HPI.  GU: Denies urinary burning, blood in urine, increased urinary frequency or incontinence. MS: + Back pain. Derm: Denies rash, itchiness, skin lesions or unhealing ulcers. Psych: + Depression. Heme: Denies bruising, easy bleeding. Neuro:  Denies headaches, dizziness or paresthesias. Endo: + DM type II.  PHYSICAL EXAM: BP 124/64   Pulse 70   Ht 5\' 7"  (1.702 m)   Wt 211 lb (95.7 kg)   BMI 33.05 kg/m  General: 68 year old male in no acute distress. Head: Normocephalic and atraumatic. Eyes:  Sclerae non-icteric, conjunctive pink. Ears: Normal auditory acuity. Mouth: Dentition intact. No ulcers or lesions.  Neck: Supple, no lymphadenopathy or thyromegaly.  Lungs: Clear bilaterally to auscultation without wheezes, crackles or rhonchi. Heart: Regular rate and rhythm.  Distant S1 and S2.  No murmur, rub or gallop appreciated.  AICD to left subclavian chest wall. Abdomen: Soft, nontender, nondistended. No masses. No hepatosplenomegaly. Normoactive bowel sounds x 4 quadrants.  Rectal: Deferred.  Musculoskeletal: Symmetrical with no gross deformities. Skin: Warm and dry. No rash or lesions on visible extremities. Extremities: Mild bilateral lower extremity  edema. Neurological: Alert oriented x 4, no focal deficits.  Psychological:  Alert and cooperative. Normal mood and affect.  ASSESSMENT AND PLAN:  68 year old male with a history of colon polyps. Colonoscopy 09/19/2018 by Dr. Orvan Falconer identified one 3 mm tubular adenomatous polyp removed from the distal sigmoid colon and a large 25 to 30 mm polyp was found in the rectum which was biopsied and tattooed. Biopsies of the rectal polyp were consistent with villous adenoma negative for high-grade dysplasia. He subsequently underwent a flexible sigmoidoscopy with EMR of a 40 mm polyp in the distal rectum, clips placed. Biopsies of the rectal polyp showed multiple fragments of tubular adenomas without dysplasia or malignancy and resection margins free of dysplasia. A repeat flexible sigmoidoscopy in 6 months was recommended but was not done.  -Cardiac clearance required prior to proceeding with a colonoscopy  -Colonoscopy at [facility] benefits and risks discussed including risk with sedation, risk of bleeding, perforation and infection.  Patient is at high risk secondary to multiple comorbidities. -Patient to hold Ozempic for 1 week prior to colonoscopy date  CAD s/p MI, s/p 2 vessel CABG and s/p PCI.  On ASA, Rosuvastatin and Carvedilol.  Ischemic cardiomyopathy with recurrent sustained VT s/p AICD.  LVEF 25 to 30% per ECHO 04/2022.  On Entresto, Furosemide and Amiodarone.  Carotid artery disease s/p left TCAR 05/2020 and right TCAR on 06/2020. On ASA.   Diabetes mellitus type 2  CKD  Carotid artery disease s/p left TCAR 05/2020 and right TCAR on 06/2020. On ASA.   ADDENDUM: Cardiac clearance received 04/12/2023 as follows:  Primary Cardiologist: Yates Decamp, MD   Chart reviewed as part of pre-operative protocol coverage. Given past medical history and time since last visit, based on ACC/AHA guidelines, Martin Bush would be at acceptable risk for the planned procedure without further cardiovascular  testing.    He is considered low risk for upcoming procedure.   Regarding ASA therapy, we recommend continuation of ASA throughout the perioperative period. However, if the surgeon feels that cessation of ASA is required in the perioperative period, it may be stopped 5-7 days prior to surgery with a plan to resume it as soon as felt to be feasible from a surgical standpoint in the post-operative period.      I will route this recommendation to the requesting party via Epic fax function and remove from pre-op pool.   Please call with questions.   Thomasene Ripple. Cleaver NP-C  CC:  Clinic, [facility]

## 2023-03-28 NOTE — Patient Instructions (Addendum)
 You have been scheduled for a colonoscopy. Please follow written instructions given to you at your visit today.   If you use inhalers (even only as needed), please bring them with you on the day of your procedure.  Cardiac clearance is required prior to scheduling your colonoscopy at [facility].  Miralax- take every night as needed  Take Miralax every night for 1 week prior to your colonoscopy date.  Hold Ferrous sulfate for 1 week prior to your colonoscopy. Restart after colonoscopy is complete.  Due to recent changes in healthcare laws, you may see the results of your imaging and laboratory studies on MyChart before your provider has had a chance to review them.  We understand that in some cases there may be results that are confusing or concerning to you. Not all laboratory results come back in the same time frame and the provider may be waiting for multiple results in order to interpret others.  Please give [place] 48 hours in order for your provider to thoroughly review all the results before contacting the office for clarification of your results.   Thank you for trusting me with your gastrointestinal care!   Alcide Evener, CRNP

## 2023-03-28 NOTE — Telephone Encounter (Signed)
 [facility] [address] [place], [place]  [address] Phone:  [phone]   Fax:  [phone]   Martin Bush DOB: 14-Apr-1955 MRN: [id number]  Dear: [name]:   The patient above is schedule for a Colonoscopy in the near future under general anesthesia (Propofol).    Please fax or route a note of Medical Clearance to [phone], Attn: Jerusalem Brownstein, CMA  Please advise if this patient will require an office visit or further medical work-up before clearance can be given.   Thank you,    [facility]

## 2023-03-28 NOTE — Progress Notes (Signed)
 Attending Physician's Attestation   I have reviewed the chart.   I agree with the Advanced Practitioner's note, impression, and recommendations with any updates as below. Patient overdue for colonoscopy EMR follow-up of large rectal advanced adenoma.  As long as no contraindication from cardiac standpoint, with hope that they feel he is optimized, we will move forward with planned and scheduled colonoscopy with EMR.   Corliss Parish, MD [facility] Advanced Endoscopy Office # [phone]

## 2023-03-29 ENCOUNTER — Telehealth: Payer: Self-pay | Admitting: Gastroenterology

## 2023-03-29 NOTE — Telephone Encounter (Signed)
 [facility] rep called to confirm office visit from yesterday, she stated a new Martin Bush is required to cover hospital procedure in April.

## 2023-04-08 NOTE — Telephone Encounter (Signed)
 Dr. Jacinto Halim, please review and provide input on cardiac risk for upcoming colonoscopy.  Thank you for your help.

## 2023-04-12 NOTE — Telephone Encounter (Signed)
     Primary Cardiologist: Yates Decamp, MD  Chart reviewed as part of pre-operative protocol coverage. Given past medical history and time since last visit, based on ACC/AHA guidelines, Martin Bush would be at acceptable risk for the planned procedure without further cardiovascular testing.   He is considered low risk for upcoming procedure.  Regarding ASA therapy, we recommend continuation of ASA throughout the perioperative period. However, if the surgeon feels that cessation of ASA is required in the perioperative period, it may be stopped 5-7 days prior to surgery with a plan to resume it as soon as felt to be feasible from a surgical standpoint in the post-operative period.    I will route this recommendation to the requesting party via Epic fax function and remove from pre-op pool.  Please call with questions.  Martin Bush. Martin Savini NP-C     04/12/2023, 8:28 AM [facility] [address] Office [phone] Fax [phone]

## 2023-04-13 NOTE — Telephone Encounter (Signed)
 Contacted [facility] from [employer] and spoke with a [facility] represenative. VA rep updated the authorization date and faxed over the new referral to our office.

## 2023-04-20 NOTE — Progress Notes (Signed)
 Attempted to contact patient and left a voicemail to return call.

## 2023-05-05 ENCOUNTER — Telehealth: Payer: Self-pay | Admitting: Gastroenterology

## 2023-05-05 ENCOUNTER — Encounter (HOSPITAL_COMMUNITY): Payer: Self-pay | Admitting: Gastroenterology

## 2023-05-05 NOTE — Telephone Encounter (Signed)
 Procedure:Colonoscopy Procedure date: 05/12/23 Procedure location: [facility] Arrival Time: 8:30 am Spoke with the patient Y/N: Yes Any prep concerns? No  Has the patient obtained the prep from the pharmacy ? Yes Do you have a care partner and transportation: Yes Any additional concerns? No

## 2023-05-12 ENCOUNTER — Ambulatory Visit (HOSPITAL_COMMUNITY): Admitting: Certified Registered"

## 2023-05-12 ENCOUNTER — Other Ambulatory Visit: Payer: Self-pay

## 2023-05-12 ENCOUNTER — Encounter (HOSPITAL_COMMUNITY)
Admission: RE | Disposition: A | Discharge status: Home / Self Care | Payer: Self-pay | Source: Ambulatory Visit | Attending: Gastroenterology

## 2023-05-12 ENCOUNTER — Ambulatory Visit (HOSPITAL_COMMUNITY)
Admission: RE | Admit: 2023-05-12 | Discharge: 2023-05-12 | Disposition: A | Discharge status: Home / Self Care | Source: Ambulatory Visit | Attending: Gastroenterology | Admitting: Gastroenterology

## 2023-05-12 ENCOUNTER — Encounter (HOSPITAL_COMMUNITY): Payer: Self-pay | Admitting: Gastroenterology

## 2023-05-12 DIAGNOSIS — E1151 Type 2 diabetes mellitus with diabetic peripheral angiopathy without gangrene: Secondary | ICD-10-CM | POA: Insufficient documentation

## 2023-05-12 DIAGNOSIS — K641 Second degree hemorrhoids: Secondary | ICD-10-CM | POA: Insufficient documentation

## 2023-05-12 DIAGNOSIS — K621 Rectal polyp: Secondary | ICD-10-CM | POA: Diagnosis not present

## 2023-05-12 DIAGNOSIS — G4733 Obstructive sleep apnea (adult) (pediatric): Secondary | ICD-10-CM | POA: Diagnosis not present

## 2023-05-12 DIAGNOSIS — K6289 Other specified diseases of anus and rectum: Secondary | ICD-10-CM | POA: Insufficient documentation

## 2023-05-12 DIAGNOSIS — E1122 Type 2 diabetes mellitus with diabetic chronic kidney disease: Secondary | ICD-10-CM | POA: Insufficient documentation

## 2023-05-12 DIAGNOSIS — I2510 Atherosclerotic heart disease of native coronary artery without angina pectoris: Secondary | ICD-10-CM | POA: Insufficient documentation

## 2023-05-12 DIAGNOSIS — I252 Old myocardial infarction: Secondary | ICD-10-CM | POA: Insufficient documentation

## 2023-05-12 DIAGNOSIS — I5042 Chronic combined systolic (congestive) and diastolic (congestive) heart failure: Secondary | ICD-10-CM | POA: Diagnosis not present

## 2023-05-12 DIAGNOSIS — N183 Chronic kidney disease, stage 3 unspecified: Secondary | ICD-10-CM | POA: Insufficient documentation

## 2023-05-12 DIAGNOSIS — I130 Hypertensive heart and chronic kidney disease with heart failure and stage 1 through stage 4 chronic kidney disease, or unspecified chronic kidney disease: Secondary | ICD-10-CM | POA: Insufficient documentation

## 2023-05-12 DIAGNOSIS — Z87891 Personal history of nicotine dependence: Secondary | ICD-10-CM | POA: Insufficient documentation

## 2023-05-12 DIAGNOSIS — Z860100 Personal history of colon polyps, unspecified: Secondary | ICD-10-CM | POA: Diagnosis not present

## 2023-05-12 DIAGNOSIS — D122 Benign neoplasm of ascending colon: Secondary | ICD-10-CM

## 2023-05-12 DIAGNOSIS — D128 Benign neoplasm of rectum: Secondary | ICD-10-CM | POA: Insufficient documentation

## 2023-05-12 DIAGNOSIS — Z9889 Other specified postprocedural states: Secondary | ICD-10-CM

## 2023-05-12 DIAGNOSIS — Z1211 Encounter for screening for malignant neoplasm of colon: Secondary | ICD-10-CM

## 2023-05-12 DIAGNOSIS — D123 Benign neoplasm of transverse colon: Secondary | ICD-10-CM | POA: Insufficient documentation

## 2023-05-12 DIAGNOSIS — K5730 Diverticulosis of large intestine without perforation or abscess without bleeding: Secondary | ICD-10-CM | POA: Diagnosis not present

## 2023-05-12 DIAGNOSIS — D125 Benign neoplasm of sigmoid colon: Secondary | ICD-10-CM

## 2023-05-12 DIAGNOSIS — K635 Polyp of colon: Secondary | ICD-10-CM | POA: Insufficient documentation

## 2023-05-12 DIAGNOSIS — Z860101 Personal history of adenomatous and serrated colon polyps: Secondary | ICD-10-CM

## 2023-05-12 HISTORY — PX: COLONOSCOPY WITH PROPOFOL: SHX5780

## 2023-05-12 HISTORY — DX: Chronic kidney disease, unspecified: N18.9

## 2023-05-12 HISTORY — PX: POLYPECTOMY: SHX149

## 2023-05-12 LAB — GLUCOSE, CAPILLARY: Glucose-Capillary: 122 mg/dL — ABNORMAL HIGH (ref 70–99)

## 2023-05-12 SURGERY — COLONOSCOPY WITH PROPOFOL
Anesthesia: Monitor Anesthesia Care

## 2023-05-12 MED ORDER — PROPOFOL 500 MG/50ML IV EMUL
INTRAVENOUS | Status: DC | PRN
  Administered 2023-05-12: 125 ug/kg/min via INTRAVENOUS

## 2023-05-12 MED ORDER — SODIUM CHLORIDE 0.9 % IV SOLN: INTRAVENOUS | Status: DC

## 2023-05-12 MED ORDER — PROPOFOL 10 MG/ML IV BOLUS
INTRAVENOUS | Status: DC | PRN
  Administered 2023-05-12: 30 mg via INTRAVENOUS

## 2023-05-12 MED ORDER — LIDOCAINE 2% (20 MG/ML) 5 ML SYRINGE
INTRAMUSCULAR | Status: DC | PRN
  Administered 2023-05-12: 100 mg via INTRAVENOUS

## 2023-05-12 MED ORDER — PROPOFOL 1000 MG/100ML IV EMUL
INTRAVENOUS | Status: AC
  Filled 2023-05-12: qty 100

## 2023-05-12 SURGICAL SUPPLY — 20 items

## 2023-05-12 NOTE — Anesthesia Preprocedure Evaluation (Addendum)
 Anesthesia Evaluation  Patient identified by MRN, date of birth, ID band Patient awake    Reviewed: Allergy & Precautions, H&P , NPO status , Patient's Chart, lab work & pertinent test results  Airway Mallampati: II  TM Distance: >3 FB Neck ROM: Full    Dental  (+) Lower Dentures, Upper Dentures   Pulmonary sleep apnea , former smoker   Pulmonary exam normal breath sounds clear to auscultation       Cardiovascular hypertension, + CAD, + Past MI, + Cardiac Stents, + Peripheral Vascular Disease and +CHF  Normal cardiovascular exam Rhythm:Regular Rate:Normal  NSTEMI CABG  HFrEF 35% s/p CRT + ICD Carotid disease s/p R and L revascularization   Neuro/Psych  PSYCHIATRIC DISORDERS  Depression    negative neurological ROS     GI/Hepatic Neg liver ROS, hiatal hernia,,,  Endo/Other  diabetes, Type 2    Renal/GU CRFRenal disease  negative genitourinary   Musculoskeletal  (+) Arthritis ,    Abdominal   Peds negative pediatric ROS (+)  Hematology negative hematology ROS (+)   Anesthesia Other Findings   Reproductive/Obstetrics negative OB ROS                             Anesthesia Physical Anesthesia Plan  ASA: 4  Anesthesia Plan: MAC   Post-op Pain Management:    Induction: Intravenous  PONV Risk Score and Plan: 1 and Propofol infusion and Treatment may vary due to age or medical condition  Airway Management Planned: Natural Airway  Additional Equipment:   Intra-op Plan:   Post-operative Plan:   Informed Consent: I have reviewed the patients History and Physical, chart, labs and discussed the procedure including the risks, benefits and alternatives for the proposed anesthesia with the patient or authorized representative who has indicated his/her understanding and acceptance.     Dental advisory given  Plan Discussed with: CRNA  Anesthesia Plan Comments:          Anesthesia Quick Evaluation

## 2023-05-12 NOTE — Transfer of Care (Signed)
 Immediate Anesthesia Transfer of Care Note  Patient: Martin Bush  Procedure(s) Performed: COLONOSCOPY WITH PROPOFOL POLYPECTOMY, INTESTINE  Patient Location: PACU  Anesthesia Type:MAC  Level of Consciousness: drowsy  Airway & Oxygen Therapy: Patient Spontanous Breathing  Post-op Assessment: Report given to RN and Post -op Vital signs reviewed and stable  Post vital signs: Reviewed and stable  Last Vitals:  Vitals Value Taken Time  BP 93/51 05/12/23 1047  Temp    Pulse 57 05/12/23 1048  Resp 15 05/12/23 1048  SpO2 97 % 05/12/23 1048  Vitals shown include unfiled device data.  Last Pain:  Vitals:   05/12/23 0932  TempSrc: Temporal  PainSc: 0-No pain         Complications: No notable events documented.

## 2023-05-12 NOTE — Discharge Instructions (Signed)
 YOU HAD AN ENDOSCOPIC PROCEDURE TODAY: Refer to the procedure report and other information in the discharge instructions given to you for any specific questions about what was found during the examination. If this information does not answer your questions, please call [place] office at [phone] to clarify.   YOU SHOULD EXPECT: Some feelings of bloating in the abdomen. Passage of more gas than usual. Walking can help get rid of the air that was put into your GI tract during the procedure and reduce the bloating. If you had a lower endoscopy (such as a colonoscopy or flexible sigmoidoscopy) you may notice spotting of blood in your stool or on the toilet paper. Some abdominal soreness may be present for a day or two, also.  DIET: Your first meal following the procedure should be a light meal and then it is ok to progress to your normal diet. A half-sandwich or bowl of soup is an example of a good first meal. Heavy or fried foods are harder to digest and may make you feel nauseous or bloated. Drink plenty of fluids but you should avoid alcoholic beverages for 24 hours. If you had a esophageal dilation, please see attached instructions for diet.    ACTIVITY: Your care partner should take you home directly after the procedure. You should plan to take it easy, moving slowly for the rest of the day. You can resume normal activity the day after the procedure however YOU SHOULD NOT DRIVE, use power tools, machinery or perform tasks that involve climbing or major physical exertion for 24 hours (because of the sedation medicines used during the test).   SYMPTOMS TO REPORT IMMEDIATELY: A gastroenterologist can be reached at any hour. Please call [phone]  for any of the following symptoms:  Following lower endoscopy (colonoscopy, flexible sigmoidoscopy) Excessive amounts of blood in the stool  Significant tenderness, worsening of abdominal pains  Swelling of the abdomen that is new, acute  Fever of 100 or  higher   FOLLOW UP:  If any biopsies were taken you will be contacted by phone or by letter within the next 1-3 weeks. Call [phone]  if you have not heard about the biopsies in 3 weeks.  Please also call with any specific questions about appointments or follow up tests.

## 2023-05-12 NOTE — H&P (Signed)
 GASTROENTEROLOGY PROCEDURE H&P NOTE   Primary Care Physician: [facility]  HPI: Martin Bush is a 68 y.o. male who presents for Colonoscopy for surveillance of prior rectal TVA removed in 2020 without followup.  Past Medical History:  Diagnosis Date   Arthritis    "thumbs"  (08/02/2017)   Chronic combined systolic and diastolic heart failure (HCC)    a. 08/2017 Echo: EF 20-25%, mod glob HK. Sev distal ant sept, inflat HK. Apical AK. Gr2 DD. Mildly reduced RV fxn.   Chronic kidney disease    III, stage 3   Colon polyps    Depression    Diabetes mellitus without complication (HCC)    History of hiatal hernia    Hyperlipidemia    Hypertension    ICD; Biventricular  Medtronic ICD Amplia MRI QWuad CRT-D  in situ 10/29/14 10/29/2014   Ischemic cardiomyopathy 09/27/2018   NSTEMI (non-ST elevated myocardial infarction) (HCC) 07/16/2014   OSA on CPAP    Peripheral vascular disease (HCC)    Proteinuria    Past Surgical History:  Procedure Laterality Date   BIOPSY  09/19/2018   Procedure: BIOPSY;  Surgeon: Tressia Danas, MD;  Location: [facility] ENDOSCOPY;  Service: Gastroenterology;;   CARDIAC CATHETERIZATION N/A 07/16/2014   Procedure: Left Heart Cath and Coronary Angiography;  Surgeon: Yates Decamp, MD;  Location: [facility] INVASIVE CV LAB;  Service: Cardiovascular;  Laterality: N/A;   CARDIAC CATHETERIZATION  07/16/2014   Procedure: Coronary Balloon Angioplasty;  Surgeon: Yates Decamp, MD;  Location: [facility] INVASIVE CV LAB;  Service: Cardiovascular;;   CARDIAC CATHETERIZATION  2003   CARDIAC DEFIBRILLATOR PLACEMENT  2016   CATARACT EXTRACTION Left 2018   CATARACT EXTRACTION W/ INTRAOCULAR LENS IMPLANT Left 03/2014   COLONOSCOPY     COLONOSCOPY WITH PROPOFOL N/A 09/19/2018   Procedure: COLONOSCOPY WITH PROPOFOL;  Surgeon: Tressia Danas, MD;  Location: [facility] ENDOSCOPY;  Service: Gastroenterology;  Laterality: N/A;   CORONARY ANGIOPLASTY WITH STENT PLACEMENT     "I've got a total of 8  stents in there; mostly doine at [facility]" (08/02/2017)   CORONARY ARTERY BYPASS GRAFT  ~ 2003   "CABG X2"; [facility]   CORONARY ATHERECTOMY N/A 08/16/2017   Procedure: CORONARY ATHERECTOMY;  Surgeon: Elder Negus, MD;  Location: [facility] INVASIVE CV LAB;  Service: Cardiovascular;  Laterality: N/A;   CORONARY BALLOON ANGIOPLASTY N/A 08/04/2017   Procedure: CORONARY BALLOON ANGIOPLASTY;  Surgeon: Yates Decamp, MD;  Location: [facility] INVASIVE CV LAB;  Service: Cardiovascular;  Laterality: N/A;   CORONARY BALLOON ANGIOPLASTY N/A 01/11/2019   Procedure: CORONARY BALLOON ANGIOPLASTY;  Surgeon: Yates Decamp, MD;  [facility] INVASIVE CV LAB;  Service: Cardiovascular;  Laterality: N/A;   CORONARY BALLOON ANGIOPLASTY N/A 10/09/2019   Procedure: CORONARY BALLOON ANGIOPLASTY;  Surgeon: Yates Decamp, MD;  [facility] INVASIVE CV LAB;  Service: Cardiovascular;  Laterality: N/A;   CORONARY STENT INTERVENTION N/A 08/16/2017   Procedure: CORONARY STENT INTERVENTION;  Surgeon: Elder Negus, MD;  [facility]VASIVE CV LAB;  Service: Cardiovascular;  Laterality: N/A;   CORONARY STENT INTERVENTION N/A 10/09/2019   Procedure: CORONARY STENT INTERVENTION;  Surgeon: Yates Decamp, MD;  [facility] CV LAB;  Service: Cardiovascular;  Laterality: N/A;   ELBOW SURGERY Left ?2001   "pinched nerve"   ENDOSCOPIC MUCOSAL RESECTION  12/18/2018   Procedure: ENDOSCOPIC MUCOSAL RESECTION;  Surgeon: Meridee Score Netty Starring., MD;  Location: [facility] ENDOSCOPY;  Service: Gastroenterology;;   EYE SURGERY Left    cataract removal   FLEXIBLE  SIGMOIDOSCOPY N/A 12/18/2018   Procedure: FLEXIBLE SIGMOIDOSCOPY;  Surgeon: Meridee Score Netty Starring., MD;  Location: [facility] ENDOSCOPY;  Service: Gastroenterology;  Laterality: N/A;   HEMOSTASIS CLIP PLACEMENT  12/18/2018   Procedure: HEMOSTASIS CLIP PLACEMENT;  Surgeon: Meridee Score Netty Starring., MD;  Location: [facility] ENDOSCOPY;  Service: Gastroenterology;;   ICD GENERATOR CHANGEOUT N/A 03/08/2023    Procedure: ICD GENERATOR CHANGEOUT;  Surgeon: Nobie Putnam, MD;  Location: [facility] INVASIVE CV LAB;  Service: Cardiovascular;  Laterality: N/A;   LEFT HEART CATH AND CORONARY ANGIOGRAPHY N/A 01/11/2019   Procedure: LEFT HEART CATH AND CORONARY ANGIOGRAPHY;  Surgeon: Yates Decamp, MD;  [facility] LAB;  Service: Cardiovascular;  Laterality: N/A;   LEFT HEART CATH AND CORS/GRAFTS ANGIOGRAPHY N/A 08/04/2017   Procedure: LEFT HEART CATH AND CORS/GRAFTS ANGIOGRAPHY;  Surgeon: Yates Decamp, MD;  [facility] CV LAB;  Service: Cardiovascular;  Laterality: N/A;   LEFT HEART CATH AND CORS/GRAFTS ANGIOGRAPHY N/A 08/20/2017   Procedure: LEFT HEART CATH AND CORS/GRAFTS ANGIOGRAPHY;  Surgeon: Elder Negus, MD;  [facility] CV LAB;  Service: Cardiovascular;  Laterality: N/A;   LEFT HEART CATH AND CORS/GRAFTS ANGIOGRAPHY N/A 01/08/2019   Procedure: LEFT HEART CATH AND CORS/GRAFTS ANGIOGRAPHY;  Surgeon: Iran Ouch, MD;  Location: [facility] INVASIVE CV LAB;  Service: Cardiovascular;  Laterality: N/A;   LEFT HEART CATH AND CORS/GRAFTS ANGIOGRAPHY N/A 10/09/2019   Procedure: LEFT HEART CATH AND CORS/GRAFTS ANGIOGRAPHY;  Surgeon: Yates Decamp, MD;  [facility] CV LAB;  Service: Cardiovascular;  Laterality: N/A;   LEFT HEART CATH AND CORS/GRAFTS ANGIOGRAPHY N/A 12/11/2019   Procedure: LEFT HEART CATH AND CORS/GRAFTS ANGIOGRAPHY;  Surgeon: Elder Negus, MD;  [facility] CV LAB;  Service: Cardiovascular;  Laterality: N/A;   POLYPECTOMY  09/19/2018   Procedure: POLYPECTOMY;  Surgeon: Tressia Danas, MD;  Location: [facility] ENDOSCOPY;  Service: Gastroenterology;;   Sunnie Nielsen INJECTION  09/19/2018   Procedure: SUBMUCOSAL INJECTION;  Surgeon: Tressia Danas, MD;  Location: [facility] ENDOSCOPY;  Service: Gastroenterology;;   SUBMUCOSAL LIFTING INJECTION  12/18/2018   Procedure: SUBMUCOSAL LIFTING INJECTION;  Surgeon: Lemar Lofty., MD;  Location: [facility] ENDOSCOPY;   Service: Gastroenterology;;   TRANSCAROTID ARTERY REVASCULARIZATION  Left 05/26/2020   Procedure: LEFT TRANSCAROTID ARTERY REVASCULARIZATION;  Surgeon: Cephus Shelling, MD;  Location: [facility] OR;  Service: Vascular;  Laterality: Left;   TRANSCAROTID ARTERY REVASCULARIZATION  Right 07/07/2020   Procedure: RIGHT TRANSCAROTID ARTERY REVASCULARIZATION;  Surgeon: Cephus Shelling, MD;  [facility] OR;  Service: Vascular;  Laterality: Right;   ULTRASOUND GUIDANCE FOR VASCULAR ACCESS Right 05/26/2020   Procedure: ULTRASOUND GUIDANCE FOR VASCULAR ACCESS;  Surgeon: Cephus Shelling, MD;  [facility] OR;  Service: Vascular;  Laterality: Right;   ULTRASOUND GUIDANCE FOR VASCULAR ACCESS Left 07/07/2020   Procedure: ULTRASOUND GUIDANCE FOR VASCULAR ACCESS;  Surgeon: Cephus Shelling, MD;  Location[facility] OR;  Service: Vascular;  Laterality: Left;   Current Facility-Administered Medications  Medication Dose Route Frequency Provider Last Rate Last Admin   0.9 %  sodium chloride infusion   Intravenous Continuous Arnaldo Natal, NP 20 mL/hr at 05/12/23 1610 Continued from Pre-op at 05/12/23 9604    Current Facility-Administered Medications:    0.9 %  sodium chloride infusion, , Intravenous, Continuous, Kennedy-Smith, Colleen M, NP, Last Rate: 20 mL/hr at 05/12/23 5409, Continued from Pre-op at 05/12/23 8119 Allergies  Allergen Reactions   Diltiazem Rash   Family History  Problem Relation Age of Onset   Uterine cancer Mother  Lung cancer Mother    Hyperlipidemia Father    Heart disease Father    Hypertension Father    Diabetes Father    Hypertension Sister    Hypertension Brother    Colon cancer Neg Hx    Social History   Socioeconomic History   Marital status: Married    Spouse name: Not on file   Number of children: 3   Years of education: Not on file   Highest education level: Not on file  Occupational History   Occupation: retired  Tobacco Use   Smoking status: Former     Current packs/day: 0.00    Average packs/day: 0.3 packs/day for 27.0 years (6.8 ttl pk-yrs)    Types: Cigars, Cigarettes    Start date: 51    Quit date: 2002    Years since quitting: 23.3   Smokeless tobacco: Former    Types: Chew    Quit date: 2002  Vaping Use   Vaping status: Never Used  Substance and Sexual Activity   Alcohol use: Not Currently   Drug use: Never   Sexual activity: Not Currently  Other Topics Concern   Not on file  Social History Narrative   Not on file   Social Drivers of Health   Financial Resource Strain: Low Risk  (12/05/2020)   Overall Financial Resource Strain (CARDIA)    Difficulty of Paying Living Expenses: Not hard at all  Food Insecurity: No Food Insecurity (12/05/2020)   Hunger Vital Sign    Worried About Running Out of Food in the Last Year: Never true    Ran Out of Food in the Last Year: Never true  Transportation Needs: No Transportation Needs (12/05/2020)   PRAPARE - Administrator, Civil Service (Medical): No    Lack of Transportation (Non-Medical): No  Physical Activity: Unknown (04/25/2017)   Exercise Vital Sign    Days of Exercise per Week: 0 days    Minutes of Exercise per Session: Not on file  Stress: No Stress Concern Present (12/05/2020)   [employer] of Occupational Health - Occupational Stress Questionnaire    Feeling of Stress : Not at all  Social Connections: Socially Integrated (12/05/2020)   Social Connection and Isolation Panel [NHANES]    Frequency of Communication with Friends and Family: Twice a week    Frequency of Social Gatherings with Friends and Family: Twice a week    Attends Religious Services: More than 4 times per year    Active Member of [employer] or Organizations: Yes    Attends Banker Meetings: Never    Marital Status: Married  Catering manager Violence: Not At Risk (12/05/2020)   Humiliation, Afraid, Rape, and Kick questionnaire    Fear of Current or Ex-Partner: No     Emotionally Abused: No    Physically Abused: No    Sexually Abused: No    Physical Exam: Today's Vitals   05/05/23 1134 05/12/23 0932  BP:  (!) 171/83  Pulse:  67  Resp:  15  Temp:  (!) 96.7 F (35.9 C)  TempSrc:  Temporal  SpO2:  99%  Weight: 93.4 kg 93.4 kg  Height:  5\' 7"  (1.702 m)  PainSc:  0-No pain   Body mass index is 32.26 kg/m. GEN: NAD EYE: Sclerae anicteric ENT: MMM CV: Non-tachycardic GI: Soft, NT/ND NEURO:  Alert & Oriented x 3  Lab Results: No results for input(s): "WBC", "HGB", "HCT", "PLT" in the last 72 hours. BMET No results for  input(s): "NA", "K", "CL", "CO2", "GLUCOSE", "BUN", "CREATININE", "CALCIUM" in the last 72 hours. LFT No results for input(s): "PROT", "ALBUMIN", "AST", "ALT", "ALKPHOS", "BILITOT", "BILIDIR", "IBILI" in the last 72 hours. PT/INR No results for input(s): "LABPROT", "INR" in the last 72 hours.   Impression / Plan: This is a 68 y.o.male who presents for Colonoscopy for surveillance of prior rectal TVA removed in 2020 without followup.  The risks and benefits of endoscopic evaluation/treatment were discussed with the patient and/or family; these include but are not limited to the risk of perforation, infection, bleeding, missed lesions, lack of diagnosis, severe illness requiring hospitalization, as well as anesthesia and sedation related illnesses.  The patient's history has been reviewed, patient examined, no change in status, and deemed stable for procedure.  The patient and/or family is agreeable to proceed.    Yong Henle, MD [facility] Office # [phone]

## 2023-05-12 NOTE — Anesthesia Postprocedure Evaluation (Signed)
 Anesthesia Post Note  Patient: Martin Bush  Procedure(s) Performed: COLONOSCOPY WITH PROPOFOL POLYPECTOMY, INTESTINE     Patient location during evaluation: PACU Anesthesia Type: MAC Level of consciousness: awake and alert Pain management: pain level controlled Vital Signs Assessment: post-procedure vital signs reviewed and stable Respiratory status: spontaneous breathing, nonlabored ventilation, respiratory function stable and patient connected to nasal cannula oxygen Cardiovascular status: blood pressure returned to baseline and stable Postop Assessment: no apparent nausea or vomiting Anesthetic complications: no   No notable events documented.  Last Vitals:  Vitals:   05/12/23 1100 05/12/23 1110  BP: (!) 116/59   Pulse: 65 66  Resp: 13 19  Temp:    SpO2: 98% 97%    Last Pain:  Vitals:   05/12/23 1110  TempSrc:   PainSc: 0-No pain                 Lethaniel Rave

## 2023-05-12 NOTE — Op Note (Signed)
 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 Estimated Blood Loss:     Estimated blood loss was minimal. Procedure:                Pre-Anesthesia Assessment:                           - Prior to the procedure, a History and Physical                            was performed, and patient medications and                            allergies were reviewed. The patient's tolerance of                            previous anesthesia was also reviewed. The risks                            and benefits of the procedure and the sedation  options and risks were discussed with the patient.                            All questions were answered, and informed consent                            was obtained. Prior Anticoagulants: The patient has                            taken no anticoagulant or antiplatelet agents                            except for aspirin. ASA Grade Assessment: III - A                            patient  with severe systemic disease. After                            reviewing the risks and benefits, the patient was                            deemed in satisfactory condition to undergo the                            procedure.                           After obtaining informed consent, the colonoscope                            was passed under direct vision. Throughout the                            procedure, the patient's blood pressure, pulse, and                            oxygen saturations were monitored continuously. The                            CF-HQ190L ([id number]) Olympus colonoscope was                            introduced through the anus and advanced to the the                            cecum, identified by appendiceal orifice and                            ileocecal valve. The colonoscopy was performed                            without difficulty. The patient tolerated the  procedure. The quality of the bowel preparation was                            adequate. The ileocecal valve, appendiceal orifice,                            and rectum were photographed. Scope In: 10:13:51 AM Scope Out: 10:37:00 AM Scope Withdrawal Time: 0 hours 17 minutes 43 seconds  Total Procedure Duration: 0 hours 23 minutes 9 seconds  Findings:      The digital rectal exam findings include hemorrhoids. Pertinent       negatives include no palpable rectal lesions.      Extensive amounts of semi-liquid stool was found in the entire colon,       interfering with visualization. Lavage of the area was performed using       copious amounts, resulting in clearance with adequate visualization.      Seven sessile polyps were found in the rectum (1), sigmoid colon (1),       transverse colon (2), hepatic flexure (1) and ascending colon (2). The       polyps were 2 to 7 mm in size. These polyps were removed with a cold       snare. Resection and retrieval were complete.       Multiple small-mouthed diverticula were found in the entire colon.      A large post mucosectomy scar was found in the mid rectum. The scar       tissue was healthy in appearance. There was no evidence of the previous       polyp. Tattoo was found underneath this whole area.      Anal papilla(e) were hypertrophied.      Non-bleeding non-thrombosed external and internal hemorrhoids were found       during retroflexion, during perianal exam and during digital exam. The       hemorrhoids were Grade II (internal hemorrhoids that prolapse but reduce       spontaneously). Impression:               - Hemorrhoids found on digital rectal exam.                           - Stool in the entire examined colon - lavaged                            copiously with adequate visualization.                           - Seven, 2 to 7 mm polyps in the rectum, in the                            sigmoid colon, in the transverse colon, at the                            hepatic flexure and in the ascending colon, removed                            with a cold snare. Resected and retrieved.                           -  Diverticulosis in the entire examined colon.                           - Post mucosectomy scar in the mid rectum. No                            evidence of recurrent polyp at the site.                           - Anal papilla was hypertrophied. Non-bleeding                            non-thrombosed external and internal hemorrhoids. Moderate Sedation:      Not Applicable - Patient had care per Anesthesia. Recommendation:           - The patient will be observed post-procedure,                            until all discharge criteria are met.                           - Discharge patient to home.                           - Patient has a contact number available for                            emergencies. The signs and symptoms of potential                            delayed complications were discussed  with the                            patient. Return to normal activities tomorrow.                            Written discharge instructions were provided to the                            patient.                           - High fiber diet.                           - Use FiberCon 1-2 tablets PO daily.                           - Continue present medications.                           - Await pathology results.                           - Repeat colonoscopy in 3 years for surveillance.  Patient should never go more than 5 years between                            colonoscopies in the setting of previous advanced                            adenoma.                           - The findings and recommendations were discussed                            with the patient.                           - The findings and recommendations were discussed                            with the patient's family. Procedure Code(s):        --- Professional ---                           [phone], Colonoscopy, flexible; with removal of                            tumor(s), polyp(s), or other lesion(s) by snare                            technique Diagnosis Code(s):        --- Professional ---                           K64.1, Second degree hemorrhoids                           D12.8, Benign neoplasm of rectum                           D12.5, Benign neoplasm of sigmoid colon                           D12.3, Benign neoplasm of transverse colon (hepatic                            flexure or splenic flexure)                           D12.2, Benign neoplasm of ascending colon                           Z98.890, Other specified postprocedural states                           K62.89, Other specified diseases of anus and rectum                           Z86.010, Personal history of  colonic polyps                           K57.30, Diverticulosis of large intestine without                             perforation or abscess without bleeding CPT copyright 2022 American Medical Association. All rights reserved. The codes documented in this report are preliminary and upon coder review may  be revised to meet current compliance requirements. Yong Henle, MD 05/12/2023 10:48:03 AM Number of Addenda: 0

## 2023-05-13 LAB — SURGICAL PATHOLOGY

## 2023-05-15 ENCOUNTER — Encounter (HOSPITAL_COMMUNITY): Payer: Self-pay | Admitting: Gastroenterology

## 2023-05-16 ENCOUNTER — Encounter: Payer: Self-pay | Admitting: Gastroenterology

## 2023-06-10 ENCOUNTER — Ambulatory Visit (INDEPENDENT_AMBULATORY_CARE_PROVIDER_SITE_OTHER): Payer: No Typology Code available for payment source

## 2023-06-10 DIAGNOSIS — K838 Other specified diseases of biliary tract: Secondary | ICD-10-CM | POA: Diagnosis not present

## 2023-06-10 LAB — CUP PACEART REMOTE DEVICE CHECK
Battery Remaining Longevity: 114 mo
Battery Voltage: 3.09 V
Brady Statistic RV Percent Paced: 97.02 %
Date Time Interrogation Session: 20250515220220
HighPow Impedance: 64 Ohm
Implantable Lead Connection Status: 753985
Implantable Lead Connection Status: 753985
Implantable Lead Connection Status: 753985
Implantable Lead Implant Date: 20161024
Implantable Lead Implant Date: 20161024
Implantable Lead Implant Date: 20161024
Implantable Lead Location: 753858
Implantable Lead Location: 753859
Implantable Lead Location: 753860
Implantable Lead Model: 4598
Implantable Lead Model: 5076
Implantable Pulse Generator Implant Date: 20250211
Lead Channel Impedance Value: 342 Ohm
Lead Channel Impedance Value: 342 Ohm
Lead Channel Impedance Value: 361 Ohm
Lead Channel Impedance Value: 418 Ohm
Lead Channel Impedance Value: 475 Ohm
Lead Channel Impedance Value: 494 Ohm
Lead Channel Impedance Value: 513 Ohm
Lead Channel Impedance Value: 627 Ohm
Lead Channel Impedance Value: 665 Ohm
Lead Channel Impedance Value: 760 Ohm
Lead Channel Impedance Value: 779 Ohm
Lead Channel Impedance Value: 779 Ohm
Lead Channel Impedance Value: 817 Ohm
Lead Channel Pacing Threshold Amplitude: 0.625 V
Lead Channel Pacing Threshold Amplitude: 1.125 V
Lead Channel Pacing Threshold Amplitude: 1.125 V
Lead Channel Pacing Threshold Pulse Width: 0.4 ms
Lead Channel Pacing Threshold Pulse Width: 0.4 ms
Lead Channel Pacing Threshold Pulse Width: 0.4 ms
Lead Channel Sensing Intrinsic Amplitude: 1.9 mV
Lead Channel Sensing Intrinsic Amplitude: 10.8 mV
Lead Channel Setting Pacing Amplitude: 1.75 V
Lead Channel Setting Pacing Amplitude: 1.75 V
Lead Channel Setting Pacing Amplitude: 2 V
Lead Channel Setting Pacing Pulse Width: 0.4 ms
Lead Channel Setting Pacing Pulse Width: 0.4 ms
Lead Channel Setting Sensing Sensitivity: 0.3 mV
Zone Setting Status: 755011
Zone Setting Status: 755011
Zone Setting Status: 755011

## 2023-06-15 ENCOUNTER — Ambulatory Visit: Payer: No Typology Code available for payment source | Admitting: Pulmonary Disease

## 2023-06-15 ENCOUNTER — Ambulatory Visit: Payer: Self-pay | Admitting: Cardiology

## 2023-06-30 ENCOUNTER — Encounter: Admitting: Cardiology

## 2023-07-07 ENCOUNTER — Ambulatory Visit: Attending: Cardiology | Admitting: Cardiology

## 2023-07-07 ENCOUNTER — Other Ambulatory Visit: Payer: Self-pay

## 2023-07-07 ENCOUNTER — Encounter: Payer: Self-pay | Admitting: Cardiology

## 2023-07-07 VITALS — BP 118/62 | HR 81 | Ht 67.0 in | Wt 211.0 lb

## 2023-07-07 DIAGNOSIS — I472 Ventricular tachycardia, unspecified: Secondary | ICD-10-CM

## 2023-07-07 DIAGNOSIS — I5042 Chronic combined systolic (congestive) and diastolic (congestive) heart failure: Secondary | ICD-10-CM | POA: Diagnosis not present

## 2023-07-07 DIAGNOSIS — Z95810 Presence of automatic (implantable) cardiac defibrillator: Secondary | ICD-10-CM

## 2023-07-07 DIAGNOSIS — Z79899 Other long term (current) drug therapy: Secondary | ICD-10-CM

## 2023-07-07 DIAGNOSIS — I255 Ischemic cardiomyopathy: Secondary | ICD-10-CM

## 2023-07-07 NOTE — Progress Notes (Signed)
 Electrophysiology Office Note:   Date:  07/07/2023  ID:  Martin Bush, DOB October 04, 1955, MRN [id number]  Primary Cardiologist: Knox Perl, MD Electrophysiologist: Ardeen Kohler, MD      History of Present Illness:   Martin Bush is a 68 y.o. male with h/o CAD and history of CABG x2 in 2002 with only LIMA to LAD being patent, PCI to left main into superdominant LCx on 10/09/2019, chronic systolic heart failure secondary to ischemic cardiomyopathy, recurrent sustained VT s/p CRT-D implantation, hypertension, mixed hyperlipidemia, controlled diabetes mellitus with stage IIIa chronic kidney disease and bilateral carotid artery stenting (left TCAR on 05/26/2020 and a right TCAR on 07/07/2020) who is being seen today for follow up of his CRT-D.   Discussed the use of AI scribe software for clinical note transcription with the patient, who gave verbal consent to proceed.  Left chest wound has healed well since generator change.  He feels generally well but experience fatigue with difficult activities. No significant chest pain, shortness of breath, fluid retention or swelling in the legs. The cardiac device has shown two episodes of non-sustained arrhythmias in April, with no treated episodes or ATP interventions since then. The device has nine years of battery life remaining.  He is currently on amiodarone  to manage his cardiac condition, which has been effective in reducing sustained arrhythmias. Recent blood work was conducted by the [place] about a month ago, but he is unsure what was tested.  He received his medications through the [place], [facility] and Dr[facility] Berry Bristol.  No new or acute complaints today.  Review of systems complete and found to be negative unless listed in HPI.   EP Information / Studies Reviewed:    EKG is not ordered today. EKG from 02/16/23 reviewed which showed AV paced rhythm. BiV paced morphology.     Echo 11/24/21:  1. Left ventricular ejection  fraction, by estimation, is 35 to 40%. Left  ventricular ejection fraction by 2D MOD biplane is 30.8 %. The left  ventricle has moderately decreased function. The left ventricle  demonstrates global hypokinesis. The left  ventricular internal cavity size was moderately dilated. Left ventricular  diastolic parameters are consistent with Grade II diastolic dysfunction  (pseudonormalization). Elevated left ventricular end-diastolic pressure.  There is akinesis of the left  ventricular, apical segment.   2. Right ventricular systolic function is normal. The right ventricular  size is normal. Tricuspid regurgitation signal is inadequate for assessing  PA pressure.   3. Left atrial size was moderately dilated.   4. The mitral valve is normal in structure. Trivial mitral valve  regurgitation. No evidence of mitral stenosis.   5. The aortic valve is normal in structure. Aortic valve regurgitation is  trivial. No aortic stenosis is present.   6. The inferior vena cava is normal in size with <50% respiratory  variability, suggesting right atrial pressure of 8 mmHg.    Physical Exam:   VS:  There were no vitals taken for this visit.   Wt Readings from Last 3 Encounters:  05/12/23 206 lb (93.4 kg)  03/28/23 211 lb (95.7 kg)  03/08/23 208 lb (94.3 kg)     GEN: Well nourished, well developed in no acute distress NECK: No JVD CARDIAC: Normal rate, regular.  Well-healed left chest incision. RESPIRATORY:  Clear to auscultation without rales, wheezing or rhonchi ABDOMEN: Soft, non-distended EXTREMITIES:  No edema; No deformity   ASSESSMENT AND PLAN:    #CRT-D: Implanted 2016.  Generator change 03/08/23. - In-clinic device check performed today.  Appropriate device function and stable lead parameters.  Presenting rhythm AS-BiV paced.  No treated arrhythmias.  Submitted longevity 9 years.  No programming changes made today. - Continue remote monitoring.  #Sustained VT:  #High risk medication  use: Amiodarone  100mg  daily.  - Continue amiodarone  and carvedilol . -Patient reports that he had extensive blood work done by the [place] within the past month.  I have asked him to resend us  his results.  If the TSH and LFTs were not checked, then we have ordered labs to be done for surveillance. - No recent VT/VF episodes on device interrogation.   #Chronic systolic heart failure:  #Ischemic cardiomyopathy: #Multivessel CAD: - Appears well compensated. No new complaints.  - Continue medical management and close follow up with primary cardiologist.  Signed, Ardeen Kohler, MD

## 2023-07-07 NOTE — Patient Instructions (Signed)
 Medication Instructions:  Your physician recommends that you continue on your current medications as directed. Please refer to the Current Medication list given to you today.  *If you need a refill on your cardiac medications before your next appointment, please call your pharmacy*  Lab Work: TODAY: LFT and TSH  Follow-Up: At [facility], you and your health needs are our priority.  As part of our continuing mission to provide you with exceptional heart care, our providers are all part of one team.  This team includes your primary Cardiologist (physician) and Advanced Practice Providers or APPs (Physician Assistants and Nurse Practitioners) who all work together to provide you with the care you need, when you need it.  Your next appointment:   1 year  Provider:   You may see Ardeen Kohler, MD or one of the following Advanced Practice Providers on your designated Care Team:   Mertha Abrahams, PA-C Michael Andy Tillery, PA-C Suzann Riddle, NP Creighton Doffing, NP

## 2023-07-18 NOTE — Progress Notes (Signed)
 Remote ICD transmission.

## 2023-07-25 DIAGNOSIS — C61 Malignant neoplasm of prostate: Secondary | ICD-10-CM

## 2023-07-25 HISTORY — DX: Malignant neoplasm of prostate: C61

## 2023-08-03 ENCOUNTER — Ambulatory Visit: Attending: Cardiovascular Disease | Admitting: Cardiology

## 2023-08-03 ENCOUNTER — Encounter: Payer: Self-pay | Admitting: Cardiology

## 2023-08-03 VITALS — BP 116/68 | HR 80 | Resp 16 | Ht 67.0 in | Wt 212.6 lb

## 2023-08-03 DIAGNOSIS — I6523 Occlusion and stenosis of bilateral carotid arteries: Secondary | ICD-10-CM | POA: Diagnosis not present

## 2023-08-03 DIAGNOSIS — I10 Essential (primary) hypertension: Secondary | ICD-10-CM | POA: Diagnosis not present

## 2023-08-03 DIAGNOSIS — I2510 Atherosclerotic heart disease of native coronary artery without angina pectoris: Secondary | ICD-10-CM

## 2023-08-03 DIAGNOSIS — I5042 Chronic combined systolic (congestive) and diastolic (congestive) heart failure: Secondary | ICD-10-CM

## 2023-08-03 DIAGNOSIS — Z95828 Presence of other vascular implants and grafts: Secondary | ICD-10-CM

## 2023-08-03 DIAGNOSIS — E7800 Pure hypercholesterolemia, unspecified: Secondary | ICD-10-CM

## 2023-08-03 MED ORDER — EZETIMIBE 10 MG PO TABS: 10.0000 mg | ORAL_TABLET | Freq: Every day | ORAL | 3 refills | Status: AC

## 2023-08-03 NOTE — Patient Instructions (Addendum)
 Medication Instructions:  Your physician has recommended you make the following change in your medication: Start Ezetimibe  10 mg by mouth daily   *If you need a refill on your cardiac medications before your next appointment, please call your pharmacy*  Lab Work: none If you have labs (blood work) drawn today and your tests are completely normal, you will receive your results only by: MyChart Message (if you have MyChart) OR A paper copy in the mail If you have any lab test that is abnormal or we need to change your treatment, we will call you to review the results.  Testing/Procedures:   Your physician has requested that you have an echocardiogram. Echocardiography is a painless test that uses sound waves to create images of your heart. It provides your doctor with information about the size and shape of your heart and how well your heart's chambers and valves are working. This procedure takes approximately one hour. There are no restrictions for this procedure. Please do NOT wear cologne, perfume, aftershave, or lotions (deodorant is allowed). Please arrive 15 minutes prior to your appointment time.  Please note: We ask at that you not bring children with you during ultrasound (echo/ vascular) testing. Due to room size and safety concerns, children are not allowed in the ultrasound rooms during exams. Our front office staff cannot provide observation of children in our lobby area while testing is being conducted. An adult accompanying a patient to their appointment will only be allowed in the ultrasound room at the discretion of the ultrasound technician under special circumstances. We apologize for any inconvenience.  Your physician has requested that you have a carotid duplex.Already scheduled for 7/21 This test is an ultrasound of the carotid arteries in your neck. It looks at blood flow through these arteries that supply the brain with blood. Allow one hour for this exam. There are no  restrictions or special instructions.   Follow-Up: At [facility], you and your health needs are our priority.  As part of our continuing mission to provide you with exceptional heart care, our providers are all part of one team.  This team includes your primary Cardiologist (physician) and Advanced Practice Providers or APPs (Physician Assistants and Nurse Practitioners) who all work together to provide you with the care you need, when you need it.  Your next appointment:   6 month(s)  Provider:   Gordy Bergamo, MD    We recommend signing up for the patient portal called MyChart.  Sign up information is provided on this After Visit Summary.  MyChart is used to connect with patients for Virtual Visits (Telemedicine).  Patients are able to view lab/test results, encounter notes, upcoming appointments, etc.  Non-urgent messages can be sent to your provider as well.   To learn more about what you can do with MyChart, go to [email or web address].   Other Instructions

## 2023-08-03 NOTE — Progress Notes (Signed)
 Cardiology Office Note:  .   Date:  08/03/2023  ID:  Martin Bush, DOB 07-Jun-1955, MRN [id number] PCP: [facility] Providers Cardiologist:  Gordy Bergamo, MD Electrophysiologist:  Fonda Kitty, MD   History of Present Illness: .   Martin Bush is a 68 y.o. Caucasian male with CAD and history of CABG x2 in 2002 with only LIMA to LAD being patent, PCI to left main into superdominant LCx on 10/09/2019, ischemic cardiomyopathy with severe LV systolic dysfunction, recurrent sustained VT SP ICD implantation, hypertension, mixed hyperlipidemia, controlled diabetes mellitus with stage IIIa chronic kidney disease and bilateral carotid artery stenting (left TCAR on 05/26/2020 and a right TCAR on 07/07/2020).  This is a 55-month office visit.  Fortunately he has not had any further hospitalization with heart failure since 11/24/2021. Now newly diagnosed prostate cancer.   Discussed the use of AI scribe software for clinical note transcription with the patient, who gave verbal consent to proceed.  History of Present Illness Martin Bush is a 68 year old male with chronic systolic heart failure who presents for routine cardiovascular follow-up.  He is stable on his current medication regimen, including Entresto , carvedilol , Jardiance , and Lasix  as needed. He also takes amlodipine  for blood pressure management. There have been no recent hospitalizations or significant cardiac events. He denies chest pain, shortness of breath, or syncope. He experiences fatigue but no orthopnea or paroxysmal nocturnal dyspnea.  He has bilateral carotid artery stenosis with stents placed in both carotid arteries. The last evaluation a year ago showed no significant blockages.  Labs   Lipoprotein (a)  Date/Time Value Ref Range Status  10/26/2021 11:20 AM 131.1 (H) <75.0 nmol/L Final    Comment:    Note:  Values greater than or equal to 75.0 nmol/L may        indicate an independent  risk factor for CHD,        but must be evaluated with caution when applied        to non-Caucasian populations due to the        influence of genetic factors on Lp(a) across        ethnicities.     Lab Results  Component Value Date   NA 142 02/28/2023   K 5.1 02/28/2023   CO2 23 02/28/2023   GLUCOSE 169 (H) 02/28/2023   BUN 24 02/28/2023   CREATININE 1.48 (H) 02/28/2023   CALCIUM  9.7 02/28/2023   GFR 55.75 (L) 12/27/2018   EGFR 52 (L) 02/28/2023   GFRNONAA 50 (L) 11/26/2021   External Labs:  Care everywhere labs 06/13/2023:  A1c 7.9%.  Total cholesterol 138, triglycerides 141, HDL 40, LDL 77.  TSH normal at 1.477.  BUN 20, creatinine 1.26, EGFR 63 mL, potassium 4.6.  LFTs were normal.  Hb 13.6/HCT 40.1, platelets 100 49K.  ROS  Review of Systems  Cardiovascular:  Negative for chest pain, dyspnea on exertion and leg swelling.    Physical Exam:   VS:  BP 116/68 (BP Location: Left Arm, Patient Position: Sitting, Cuff Size: Large)   Pulse 80   Resp 16   Ht 5' 7 (1.702 m)   Wt 212 lb 9.6 oz (96.4 kg)   SpO2 95%   BMI 33.30 kg/m    Wt Readings from Last 3 Encounters:  08/03/23 212 lb 9.6 oz (96.4 kg)  07/07/23 211 lb (95.7 kg)  05/12/23 206 lb (93.4 kg)    Physical Exam Neck:  Vascular: Carotid bruit (right) present. No JVD.  Cardiovascular:     Rate and Rhythm: Normal rate and regular rhythm.     Pulses:          Dorsalis pedis pulses are 0 on the right side and 0 on the left side.       Posterior tibial pulses are 0 on the right side and 0 on the left side.     Heart sounds: Normal heart sounds. No murmur heard.    No gallop.  Pulmonary:     Effort: Pulmonary effort is normal.     Breath sounds: Normal breath sounds.  Abdominal:     General: Bowel sounds are normal.     Palpations: Abdomen is soft.  Musculoskeletal:     Right lower leg: No edema.     Left lower leg: No edema.    Studies Reviewed: SABRA    Coronary angiogram 12/11/2019: 3.5 x 20  in LM and proximal CX and 3.0 x 20 mm in mid to distal dominant CX with Synergy DES widely patent.    Echocardiogram 05/24/2022: Severely depressed LV systolic function with visual EF 25-30%. Left ventricle cavity is normal in size. Mild concentric hypertrophy of the left ventricle. Hypokinetic global wall motion. Doppler evidence of grade II (pseudonormal) diastolic dysfunction, elevated LAP. Calculated EF 25%. EKG:         Medications ordered    Meds ordered this encounter  Medications   ezetimibe  (ZETIA ) 10 MG tablet    Sig: Take 1 tablet (10 mg total) by mouth daily.    Dispense:  90 tablet    Refill:  3     ASSESSMENT AND PLAN: .      ICD-10-CM   1. Chronic combined systolic and diastolic heart failure (HCC)  P49.57 ECHOCARDIOGRAM COMPLETE    2. Coronary artery disease involving native coronary artery of native heart without angina pectoris  I25.10 ezetimibe  (ZETIA ) 10 MG tablet    ECHOCARDIOGRAM COMPLETE    3. Primary hypertension  I10     4. Bilateral carotid artery stenosis  I65.23 CANCELED: VAS US  CAROTID    5. Internal carotid artery stent present (Bilateral TCAR)  Z95.828 CANCELED: VAS US  CAROTID    6. Hypercholesteremia  E78.00 ezetimibe  (ZETIA ) 10 MG tablet     Assessment & Plan Chronic systolic heart failure Chronic systolic heart failure is well-managed with no recent hospitalizations or symptoms such as dyspnea, angina, or syncope. Current medications include Entresto , carvedilol , Jardiance , and torsemide  as needed, which also manage hypertension. - Continue current medications: Entresto , carvedilol , Jardiance , and torsemide  as needed. - Order echocardiogram to establish baseline before potential prostate surgery.  CAD of the native vessel without angina -Patient remained stable without recurrence of angina.  Usually presents with pulmonary edema and shortness of breath, fortunately symptoms are remained stable.  Bilateral carotid artery stenosis with  stents Bilateral carotid artery stenosis with stents placed for high-degree blockage. Last evaluation a year ago showed patent stents. - Order carotid duplex to assess current status of carotid stents. - If stable carotid duplex, no further evaluation is indicated.  Carotid stents were in 2022.  Hyperlipidemia LDL cholesterol increased to 77 mg/dL from 69 mg/dL.  - Prescribe Zetimibe (Zetia ) 10 mg daily, to be filled at the [place]. goal LDL <70 and preferably closer to 55. - Patient will probably be undergoing prostate cancer therapy, we will continue to follow his labs as more along  Prostate cancer New diagnosis of prostate cancer confirmed  by biopsy. Scheduled for a consultation next week at the [facility]. sider referral for robotic surgery if indicated.  Signed,  Gordy Bergamo, MD, [facility] 08/03/2023, 8:24 PM [facility] [address] [place], [place] [address] Phone: [phone]. Fax:  [phone]

## 2023-08-15 ENCOUNTER — Ambulatory Visit (HOSPITAL_COMMUNITY)
Admission: RE | Admit: 2023-08-15 | Discharge: 2023-08-15 | Disposition: A | Discharge status: Home / Self Care | Payer: No Typology Code available for payment source | Source: Ambulatory Visit | Attending: Cardiology | Admitting: Cardiology

## 2023-08-15 DIAGNOSIS — Z95828 Presence of other vascular implants and grafts: Secondary | ICD-10-CM | POA: Diagnosis present

## 2023-08-16 ENCOUNTER — Ambulatory Visit: Payer: Self-pay | Admitting: Cardiology

## 2023-08-16 NOTE — Progress Notes (Signed)
 Your carotid stents  are widely patent. No further follow up needed unless clinically indicated

## 2023-08-19 ENCOUNTER — Other Ambulatory Visit: Payer: Self-pay | Admitting: Student

## 2023-08-19 DIAGNOSIS — C61 Malignant neoplasm of prostate: Secondary | ICD-10-CM

## 2023-08-31 ENCOUNTER — Ambulatory Visit
Admission: RE | Admit: 2023-08-31 | Discharge: 2023-08-31 | Disposition: A | Discharge status: Home / Self Care | Source: Ambulatory Visit | Attending: Student | Admitting: Student

## 2023-08-31 DIAGNOSIS — C61 Malignant neoplasm of prostate: Secondary | ICD-10-CM | POA: Insufficient documentation

## 2023-08-31 MED ORDER — FLOTUFOLASTAT F 18 GALLIUM 296-5846 MBQ/ML IV SOLN
8.0000 | Freq: Once | INTRAVENOUS | Status: AC
  Administered 2023-08-31: 8.45 via INTRAVENOUS
  Filled 2023-08-31: qty 8

## 2023-09-05 NOTE — Progress Notes (Signed)
 GU Location of Tumor / Histology: Prostate Ca  If Prostate Cancer, Gleason Score is (4 + 5) and PSA is (7.17 on 08/08/2023)  PSA  6.56 on 06/13/2023 PSA  6.11 on 04/16/2022 PSA  1.82 on 04/07/2022  Addie Gordy Kelly presented as referral from Dr. Frederic DOROTHA Grain [facility]. [facility])  Biopsies    08/31/2023 Dr. Daniel Margalski NM PET ([facility]) Skull to Mid Thigh CLINICAL DATA:  Malignant neoplasm of prostate; prostate biopsy 07-18-23; PSA 7.17 ng/mL.  08-08-23; no recent antibiotics/steroids; no history of radiation/chemotherapy.  IMPRESSION: 1. Focal area of increased radiotracer uptake within the right side of the prostate gland extending into the apex, with a corresponding SUV max of 7.2. imaging findings. This may reflect the patient's known prostate cancer. 2. Tiny lymph node within the right external iliac nodal chain with increased radiotracer uptake, SUV max of 7.9. Suspicious for nodal metastasis. 3. 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   Past/Anticipated interventions by urology, if any: NA  Past/Anticipated interventions by medical oncology, if any:  Dr. Frederic DOROTHA Grain    Weight changes, if any:  No  IPSS:  4 SHIM:  8  Bowel/Bladder complaints, if any:  No  Nausea/Vomiting, if any: No  Pain issues, if any:  0/10  SAFETY ISSUES: Prior radiation?  No Pacemaker/ICD? ICD Possible current pregnancy? Male Is the patient on methotrexate? No  Current Complaints / other details:  None  30 minutes spent total, including time for meaningful use questions, reviewing medication, as well as spent in face-to-face time in nurse evaluation with the patient.

## 2023-09-06 NOTE — Progress Notes (Signed)
 STAT read request placed on [facility] PET for upcoming consult on Thursday, 8/14.

## 2023-09-07 ENCOUNTER — Encounter: Payer: Self-pay | Admitting: Urology

## 2023-09-07 DIAGNOSIS — C61 Malignant neoplasm of prostate: Secondary | ICD-10-CM | POA: Insufficient documentation

## 2023-09-08 ENCOUNTER — Encounter: Payer: Self-pay | Admitting: Cardiology

## 2023-09-08 ENCOUNTER — Ambulatory Visit
Admission: RE | Admit: 2023-09-08 | Discharge: 2023-09-08 | Disposition: A | Discharge status: Home / Self Care | Source: Ambulatory Visit | Attending: Radiation Oncology | Admitting: Radiation Oncology

## 2023-09-08 ENCOUNTER — Encounter: Payer: Self-pay | Admitting: Radiation Oncology

## 2023-09-08 VITALS — BP 154/86 | HR 84 | Temp 98.1°F | Resp 18 | Ht 67.0 in | Wt 214.4 lb

## 2023-09-08 DIAGNOSIS — E1122 Type 2 diabetes mellitus with diabetic chronic kidney disease: Secondary | ICD-10-CM | POA: Insufficient documentation

## 2023-09-08 DIAGNOSIS — E1151 Type 2 diabetes mellitus with diabetic peripheral angiopathy without gangrene: Secondary | ICD-10-CM | POA: Diagnosis not present

## 2023-09-08 DIAGNOSIS — G4733 Obstructive sleep apnea (adult) (pediatric): Secondary | ICD-10-CM | POA: Insufficient documentation

## 2023-09-08 DIAGNOSIS — I5042 Chronic combined systolic (congestive) and diastolic (congestive) heart failure: Secondary | ICD-10-CM | POA: Diagnosis not present

## 2023-09-08 DIAGNOSIS — E785 Hyperlipidemia, unspecified: Secondary | ICD-10-CM | POA: Insufficient documentation

## 2023-09-08 DIAGNOSIS — C61 Malignant neoplasm of prostate: Secondary | ICD-10-CM

## 2023-09-08 DIAGNOSIS — Z7985 Long-term (current) use of injectable non-insulin antidiabetic drugs: Secondary | ICD-10-CM | POA: Insufficient documentation

## 2023-09-08 DIAGNOSIS — I252 Old myocardial infarction: Secondary | ICD-10-CM | POA: Insufficient documentation

## 2023-09-08 DIAGNOSIS — Z87891 Personal history of nicotine dependence: Secondary | ICD-10-CM | POA: Diagnosis not present

## 2023-09-08 DIAGNOSIS — I130 Hypertensive heart and chronic kidney disease with heart failure and stage 1 through stage 4 chronic kidney disease, or unspecified chronic kidney disease: Secondary | ICD-10-CM | POA: Insufficient documentation

## 2023-09-08 DIAGNOSIS — Z794 Long term (current) use of insulin: Secondary | ICD-10-CM | POA: Insufficient documentation

## 2023-09-08 DIAGNOSIS — I255 Ischemic cardiomyopathy: Secondary | ICD-10-CM | POA: Diagnosis not present

## 2023-09-08 DIAGNOSIS — C7951 Secondary malignant neoplasm of bone: Secondary | ICD-10-CM | POA: Diagnosis present

## 2023-09-08 DIAGNOSIS — Z860100 Personal history of colon polyps, unspecified: Secondary | ICD-10-CM | POA: Diagnosis not present

## 2023-09-08 DIAGNOSIS — Z7984 Long term (current) use of oral hypoglycemic drugs: Secondary | ICD-10-CM | POA: Insufficient documentation

## 2023-09-08 DIAGNOSIS — Z79899 Other long term (current) drug therapy: Secondary | ICD-10-CM | POA: Insufficient documentation

## 2023-09-08 DIAGNOSIS — Z801 Family history of malignant neoplasm of trachea, bronchus and lung: Secondary | ICD-10-CM | POA: Diagnosis not present

## 2023-09-08 DIAGNOSIS — Z7982 Long term (current) use of aspirin: Secondary | ICD-10-CM | POA: Insufficient documentation

## 2023-09-08 HISTORY — DX: Elevated prostate specific antigen (PSA): R97.20

## 2023-09-08 NOTE — Progress Notes (Signed)
 Introduced myself to the patient, and his wife, as the prostate nurse navigator.  No barriers to care identified at this time.  He is here to discuss his radiation treatment options and will proceed with ADT, Brachy boost, and 5 weeks of daily radiation (Dr. Lenn).  I gave him my business card and asked him to call me with questions or concerns.  Verbalized understanding.

## 2023-09-08 NOTE — Progress Notes (Signed)
 Radiation Oncology         [phone]) [phone] ________________________________  Initial Outpatient Consultation  Name: Martin Bush MRN: [id number]  Date: 09/08/2023  DOB: [birth year]  RR:Ropwpr, Bonni Refugia Sindy Frederic Fairy, MD   REFERRING PHYSICIAN: Sindy Frederic Fairy, MD  DIAGNOSIS: 68 y.o. 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    ICD-10-CM   1. Malignant neoplasm of prostate [facility])  C61 Ambulatory referral to Radiation Oncology      HISTORY OF PRESENT ILLNESS: Martin Bush is a 68 y.o. male with a diagnosis of prostate cancer. He has a history of elevated PSA since at least 08/13/2021 when the PSA was 4.1.  The PSA has been steadily rising over the years, at 5.2 in October 2023, 5.3 on April 12, 2022, and remained elevated at 6.1 when repeated on April 16, 2022 and 6.56 in May 2025.  Accordingly, he was referred for evaluation in urology by Dr. Cathye in 05/2023 and  digital rectal examination performed at that time showed no nodularity or concerning findings.  A repeat PSA 08/08/2023 remained elevated at 7.17.  Therefore, the patient proceeded to transrectal ultrasound with 12 biopsies of the prostate on 07/18/2023.  The prostate volume measured 64 cc.  Out of 14 core biopsies, 6 were positive, all on the right.  The maximum Gleason score was 4+5, and this was seen in 6 of 8 samples from the right lobe of the prostate.  He met with Dr. Carollee (medical oncology) and Dr. Sindy (radiation oncology) at the [facility], to discuss treatment options and a PSMA PET was recommended to complete his disease staging.  A PSMA PET scan was performed on 08/31/2023 for disease staging and confirmed oligometastatic disease in the right scapula, left skull base and a solitary, 6 mm right external iliac lymph node.  The patient reviewed the biopsy and imaging results with  Dr. Renda on 09/06/2023 who did not feel that he was a surgical candidate given the metastatic disease and multiple medical co-morbidities. Therefore, he has kindly been referred today for discussion of potential radiation treatment options.  He is accompanied by his wife, Martin Bush, for today's visit.   PREVIOUS RADIATION THERAPY: No  PAST MEDICAL HISTORY:  Past Medical History:  Diagnosis Date   Arthritis    thumbs  (08/02/2017)   Chronic combined systolic and diastolic heart failure (HCC)    a. 08/2017 Echo: EF 20-25%, mod glob HK. Sev distal ant sept, inflat HK. Apical AK. Gr2 DD. Mildly reduced RV fxn.   Chronic kidney disease    III, stage 3   Colon polyps    Depression    Diabetes mellitus without complication (HCC)    Elevated PSA    History of hiatal hernia    Hyperlipidemia    Hypertension    ICD; Biventricular  Medtronic ICD Amplia MRI QWuad CRT-D  in situ 10/29/14 10/29/2014   Ischemic cardiomyopathy 09/27/2018   NSTEMI (non-ST elevated myocardial infarction) (HCC) 07/16/2014   OSA on CPAP    Peripheral vascular disease (HCC)    Prostate cancer (HCC) 07/25/2023   [facility]   Proteinuria       PAST SURGICAL HISTORY: Past Surgical History:  Procedure Laterality Date   BIOPSY  09/19/2018   Procedure: BIOPSY;  Surgeon: Eda Iha, MD;  Location: [facility] ENDOSCOPY;  Service: Gastroenterology;;   CARDIAC CATHETERIZATION N/A 07/16/2014  Procedure: Left Heart Cath and Coronary Angiography;  Surgeon: Gordy Bergamo, MD;  Location: [facility] INVASIVE CV LAB;  Service: Cardiovascular;  Laterality: N/A;   CARDIAC CATHETERIZATION  07/16/2014   Procedure: Coronary Balloon Angioplasty;  Surgeon: Gordy Bergamo, MD;  Location: [facility] INVASIVE CV LAB;  Service: Cardiovascular;;   CARDIAC CATHETERIZATION  2003   CARDIAC DEFIBRILLATOR PLACEMENT  2016   CATARACT EXTRACTION Left 2018   CATARACT EXTRACTION W/ INTRAOCULAR LENS IMPLANT Left 03/2014   COLONOSCOPY     COLONOSCOPY WITH PROPOFOL  N/A 09/19/2018    Procedure: COLONOSCOPY WITH PROPOFOL ;  Surgeon: Eda Iha, MD;  Location: [facility];  Service: Gastroenterology;  Laterality: N/A;   COLONOSCOPY WITH PROPOFOL  N/A 05/12/2023   Procedure: COLONOSCOPY WITH PROPOFOL ;  Surgeon: Wilhelmenia Aloha Raddle., MD;  Location: [facility];  Service: Gastroenterology;  Laterality: N/A;   CORONARY ANGIOPLASTY WITH STENT PLACEMENT     I've got a total of 8 stents in there; mostly doine at [facility] (08/02/2017)   CORONARY ARTERY BYPASS GRAFT  ~ 2003   CABG X2; [facility]   CORONARY ATHERECTOMY N/A 08/16/2017   Procedure: CORONARY ATHERECTOMY;  Surgeon: Elmira Newman PARAS, MD;  Location: [facility] INVASIVE CV LAB;  Service: Cardiovascular;  Laterality: N/A;   CORONARY BALLOON ANGIOPLASTY N/A 08/04/2017   Procedure: CORONARY BALLOON ANGIOPLASTY;  Surgeon: Bergamo Gordy, MD;  Location: [facility] INVASIVE CV LAB;  Service: Cardiovascular;  Laterality: N/A;   CORONARY BALLOON ANGIOPLASTY N/A 01/11/2019   Procedure: CORONARY BALLOON ANGIOPLASTY;  Surgeon: Bergamo Gordy, MD;  Location: [facility] INVASIVE CV LAB;  Service: Cardiovascular;  Laterality: N/A;   CORONARY BALLOON ANGIOPLASTY N/A 10/09/2019   Procedure: CORONARY BALLOON ANGIOPLASTY;  Surgeon: Bergamo Gordy, MD;  Location: [facility] INVASIVE CV LAB;  Service: Cardiovascular;  Laterality: N/A;   CORONARY STENT INTERVENTION N/A 08/16/2017   Procedure: CORONARY STENT INTERVENTION;  Surgeon: Elmira Newman PARAS, MD;  [facility]: [facility] INVASIVE CV LAB;  Service: Cardiovascular;  Laterality: N/A;   CORONARY STENT INTERVENTION N/A 10/09/2019   Procedure: CORONARY STENT INTERVENTION;  Surgeon: Bergamo Gordy, MD;  [facility] INVASIVE CV LAB;  Service: Cardiovascular;  Laterality: N/A;   ELBOW SURGERY Left ?2001   pinched nerve   ENDOSCOPIC MUCOSAL RESECTION  12/18/2018   Procedure: ENDOSCOPIC MUCOSAL RESECTION;  Surgeon: Wilhelmenia Aloha Raddle., MD;  Location: [facility] ENDOSCOPY;  Service: Gastroenterology;;   EYE SURGERY Left    cataract  removal   FLEXIBLE SIGMOIDOSCOPY N/A 12/18/2018   Procedure: FLEXIBLE SIGMOIDOSCOPY;  Surgeon: Wilhelmenia Aloha Raddle., MD;  Location: [facility] ENDOSCOPY;  Service: Gastroenterology;  Laterality: N/A;   HEMOSTASIS CLIP PLACEMENT  12/18/2018   Procedure: HEMOSTASIS CLIP PLACEMENT;  Surgeon: Wilhelmenia Aloha Raddle., MD;  Location: [facility] ENDOSCOPY;  Service: Gastroenterology;;   ICD GENERATOR CHANGEOUT N/A 03/08/2023   Procedure: ICD GENERATOR CHANGEOUT;  Surgeon: Kennyth Chew, MD;  Location: [facility] INVASIVE CV LAB;  Service: Cardiovascular;  Laterality: N/A;   LEFT HEART CATH AND CORONARY ANGIOGRAPHY N/A 01/11/2019   Procedure: LEFT HEART CATH AND CORONARY ANGIOGRAPHY;  Surgeon: Bergamo Gordy, MD;  [facility] INVASIVE CV LAB;  Service: Cardiovascular;  Laterality: N/A;   LEFT HEART CATH AND CORS/GRAFTS ANGIOGRAPHY N/A 08/04/2017   Procedure: LEFT HEART CATH AND CORS/GRAFTS ANGIOGRAPHY;  Surgeon: Bergamo Gordy, MD;  [facility] INVASIVE CV LAB;  Service: Cardiovascular;  Laterality: N/A;   LEFT HEART CATH AND CORS/GRAFTS ANGIOGRAPHY N/A 08/20/2017   Procedure: LEFT HEART CATH AND CORS/GRAFTS ANGIOGRAPHY;  Surgeon: Elmira Newman PARAS, MD;  [facility]: [facility] INVASIVE CV LAB;  Service: Cardiovascular;  Laterality: N/A;   LEFT HEART CATH AND CORS/GRAFTS ANGIOGRAPHY N/A 01/08/2019   Procedure: LEFT HEART CATH AND CORS/GRAFTS ANGIOGRAPHY;  Surgeon: Darron Deatrice LABOR, MD;  Location: [facility] INVASIVE CV LAB;  Service: Cardiovascular;  Laterality: N/A;   LEFT HEART CATH AND CORS/GRAFTS ANGIOGRAPHY N/A 10/09/2019   Procedure: LEFT HEART CATH AND CORS/GRAFTS ANGIOGRAPHY;  Surgeon: Ladona Heinz, MD;  [facility]: [facility] INVASIVE CV LAB;  Service: Cardiovascular;  Laterality: N/A;   LEFT HEART CATH AND CORS/GRAFTS ANGIOGRAPHY N/A 12/11/2019   Procedure: LEFT HEART CATH AND CORS/GRAFTS ANGIOGRAPHY;  Surgeon: Elmira Newman PARAS, MD;  Location: [facility] INVASIVE CV LAB;  Service: Cardiovascular;  Laterality: N/A;   POLYPECTOMY  09/19/2018    Procedure: POLYPECTOMY;  Surgeon: Eda Iha, MD;  Location: [facility]OS[facility];  Service: Gastroenterology;;   POLYPECTOMY  05/12/2023   Procedure: POLYPECTOMY, INTESTINE;  Surgeon: Wilhelmenia Aloha Raddle., MD;  Location: [facility];  Service: Gastroenterology;;   PROSTATE BIOPSY     SUBMUCOSAL INJECTION  09/19/2018   Procedure: SUBMUCOSAL INJECTION;  Surgeon: Eda Iha, MD;  Location: [facility];  Service: Gastroenterology;;   ROBLEY LIFTING INJECTION  12/18/2018   Procedure: SUBMUCOSAL LIFTING INJECTION;  Surgeon: Wilhelmenia Aloha Raddle., MD;  Location: [facility] ENDOSCOPY;  Service: Gastroenterology;;   TRANSCAROTID ARTERY REVASCULARIZATION  Left 05/26/2020   Procedure: LEFT TRANSCAROTID ARTERY REVASCULARIZATION;  Surgeon: Gretta Lonni PARAS, MD;  Location: [facility] OR;  Service: Vascular;  Laterality: Left;   TRANSCAROTID ARTERY REVASCULARIZATION  Right 07/07/2020   Procedure: RIGHT TRANSCAROTID ARTERY REVASCULARIZATION;  Surgeon: Gretta Lonni PARAS, MD;  Location: [facility] OR;  Service: Vascular;  Laterality: Right;   ULTRASOUND GUIDANCE FOR VASCULAR ACCESS Right 05/26/2020   Procedure: ULTRASOUND GUIDANCE FOR VASCULAR ACCESS;  Surgeon: Gretta Lonni PARAS, MD;  Location: [facility] OR;  Service: Vascular;  Laterality: Right;   ULTRASOUND GUIDANCE FOR VASCULAR ACCESS Left 07/07/2020   Procedure: ULTRASOUND GUIDANCE FOR VASCULAR ACCESS;  Surgeon: Gretta Lonni PARAS, MD;  Location: [facility] OR;  Service: Vascular;  Laterality: Left;    FAMILY HISTORY:  Family History  Problem Relation Age of Onset   Uterine cancer Mother    Lung cancer Mother    Hyperlipidemia Father    Heart disease Father    Hypertension Father    Diabetes Father    Hypertension Sister    Hypertension Brother    Colon cancer Neg Hx     SOCIAL HISTORY:  Social History   Socioeconomic History   Marital status: Married    Spouse name: Not on file   Number of children: 3   Years of education: Not on file   Highest  education level: Not on file  Occupational History   Occupation: retired  Tobacco Use   Smoking status: Former    Current packs/day: 0.00    Average packs/day: 0.3 packs/day for 27.0 years (6.8 ttl pk-yrs)    Types: Cigars, Cigarettes    Start date: 89    Quit date: 2002    Years since quitting: 23.6   Smokeless tobacco: Former    Types: Chew    Quit date: 2002  Vaping Use   Vaping status: Never Used  Substance and Sexual Activity   Alcohol use: Not Currently   Drug use: Never   Sexual activity: Not Currently  Other Topics Concern   Not on file  Social History Narrative   Not on file   Social Drivers of Health   Financial Resource Strain: Low Risk  (12/05/2020)   Overall Financial Resource Strain (CARDIA)  Difficulty of Paying Living Expenses: Not hard at all  Food Insecurity: No Food Insecurity (09/08/2023)   Hunger Vital Sign    Worried About Running Out of Food in the Last Year: Never true    Ran Out of Food in the Last Year: Never true  Transportation Needs: No Transportation Needs (09/08/2023)   PRAPARE - Administrator, Civil Service (Medical): No    Lack of Transportation (Non-Medical): No  Physical Activity: Unknown (04/25/2017)   Exercise Vital Sign    Days of Exercise per Week: 0 days    Minutes of Exercise per Session: Not on file  Stress: No Stress Concern Present (12/05/2020)   [employer] of Occupational Health - Occupational Stress Questionnaire    Feeling of Stress : Not at all  Social Connections: Socially Integrated (12/05/2020)   Social Connection and Isolation Panel    Frequency of Communication with Friends and Family: Twice a week    Frequency of Social Gatherings with Friends and Family: Twice a week    Attends Religious Services: More than 4 times per year    Active Member of [employer] or Organizations: Yes    Attends Banker Meetings: Never    Marital Status: Married  Catering manager Violence: Not At Risk  (09/08/2023)   Humiliation, Afraid, Rape, and Kick questionnaire    Fear of Current or Ex-Partner: No    Emotionally Abused: No    Physically Abused: No    Sexually Abused: No    ALLERGIES: Diltiazem  MEDICATIONS:  Current Outpatient Medications  Medication Sig Dispense Refill   amiodarone  (PACERONE ) 200 MG tablet Take 0.5 tablets (100 mg total) by mouth daily. 45 tablet 3   amLODipine  (NORVASC ) 5 MG tablet TAKE 1 TABLET BY MOUTH DAILY 90 tablet 2   aspirin  EC 81 MG EC tablet Take 1 tablet (81 mg total) by mouth daily.     carvedilol  (COREG ) 25 MG tablet Take 1 tablet (25 mg total) by mouth 2 (two) times daily with a meal. 180 tablet 3   Cholecalciferol  (VITAMIN D3) 50 MCG (2000 UT) TABS Take 2,000 Units by mouth 2 (two) times daily.      Cyanocobalamin  (VITAMIN B-12) 2500 MCG SUBL Take 2,500 mcg by mouth in the morning.     empagliflozin  (JARDIANCE ) 25 MG TABS tablet Take 1 tablet (25 mg total) by mouth daily before breakfast. 90 tablet 3   ezetimibe  (ZETIA ) 10 MG tablet Take 1 tablet (10 mg total) by mouth daily. 90 tablet 3   ferrous sulfate  325 (65 FE) MG tablet Take 325 mg by mouth every Monday, Wednesday, and Friday. In the morning     glucose 4 GM chewable tablet 1 tablet once as needed for low blood sugar.     insulin  isophane & regular human (NOVOLIN 70/30 FLEXPEN) (70-30) 100 UNIT/ML KwikPen Inject 0-30 Units into the skin 2 (two) times daily as needed (high blood sugar).     metFORMIN  (GLUCOPHAGE -XR) 500 MG 24 hr tablet Take 500 mg by mouth 2 (two) times daily.     nitroGLYCERIN  (NITROSTAT ) 0.4 MG SL tablet Place 1 tablet (0.4 mg total) under the tongue every 5 (five) minutes x 3 doses as needed for chest pain. 25 tablet 3   Omega-3 Fatty Acids (FISH OIL) 1000 MG CAPS Take 2,000 mg by mouth 3 (three) times daily.     OXYGEN  Inhale 2 L/min into the lungs See admin instructions. 2 L/min at bedtime in conjunction with CPAP  PRESCRIPTION MEDICATION Inhale into the lungs See  admin instructions. CPAP- At bedtime     rosuvastatin  (CRESTOR ) 40 MG tablet Take 1 tablet (40 mg total) by mouth at bedtime.     sacubitril -valsartan  (ENTRESTO ) 49-51 MG Take 1 tablet by mouth 2 (two) times daily. 60 tablet 0   sertraline  (ZOLOFT ) 50 MG tablet Take 50 mg by mouth at bedtime.     tirzepatide (MOUNJARO) 7.5 MG/0.5ML Pen Inject 7.5 mg into the skin once a week.     No current facility-administered medications for this encounter.    REVIEW OF SYSTEMS:  On review of systems, the patient reports that he is doing well overall. He denies any chest pain, shortness of breath, cough, fevers, chills, night sweats, unintended weight changes. He denies any bowel disturbances, and denies abdominal pain, nausea or vomiting. He denies any new musculoskeletal or joint aches or pains. His IPSS was 4, indicating minimal urinary symptoms. His SHIM was 8, indicating he has severe erectile dysfunction. A complete review of systems is obtained and is otherwise negative.    PHYSICAL EXAM:  Wt Readings from Last 3 Encounters:  09/08/23 214 lb 6.4 oz (97.3 kg)  08/03/23 212 lb 9.6 oz (96.4 kg)  07/07/23 211 lb (95.7 kg)   Temp Readings from Last 3 Encounters:  09/08/23 98.1 F (36.7 C) (Oral)  05/12/23 (!) 96.7 F (35.9 C) (Temporal)  03/08/23 97.7 F (36.5 C) (Oral)   BP Readings from Last 3 Encounters:  09/08/23 (!) 154/86  08/03/23 116/68  07/07/23 118/62   Pulse Readings from Last 3 Encounters:  09/08/23 84  08/03/23 80  07/07/23 81   Pain Assessment Pain Score: 0-No pain/10  In general this is a well appearing Caucasian male in no acute distress. He's alert and oriented x4 and appropriate throughout the examination. Cardiopulmonary assessment is negative for acute distress, and he exhibits normal effort.     KPS = 90  100 - Normal; no complaints; no evidence of disease. 90   - Able to carry on normal activity; minor signs or symptoms of disease. 80   - Normal activity with  effort; some signs or symptoms of disease. 64   - Cares for self; unable to carry on normal activity or to do active work. 60   - Requires occasional assistance, but is able to care for most of his personal needs. 50   - Requires considerable assistance and frequent medical care. 40   - Disabled; requires special care and assistance. 30   - Severely disabled; hospital admission is indicated although death not imminent. 20   - Very sick; hospital admission necessary; active supportive treatment necessary. 10   - Moribund; fatal processes progressing rapidly. 0     - Dead  Karnofsky DA, Abelmann WH, Craver LS and Burchenal [facility] [phone]) The use of the nitrogen mustards in the palliative treatment of carcinoma: with particular reference to bronchogenic carcinoma Cancer 1 634-56  LABORATORY DATA:  Lab Results  Component Value Date   WBC 7.9 02/28/2023   HGB 14.2 02/28/2023   HCT 43.8 02/28/2023   MCV 94 02/28/2023   PLT 172 02/28/2023   Lab Results  Component Value Date   NA 142 02/28/2023   K 5.1 02/28/2023   CL 104 02/28/2023   CO2 23 02/28/2023   Lab Results  Component Value Date   ALT 16 11/24/2021   AST 17 11/24/2021   ALKPHOS 133 (H) 11/24/2021   BILITOT 0.8 11/24/2021  RADIOGRAPHY: NM PET (PSMA) SKULL TO MID THIGH Result Date: 09/06/2023 EXAM: PROSTATE PET SKULL BASE TO MID THIGHS 08/31/2023 02:11:01 PM TECHNIQUE: PET imaging was obtained from skull vertex to mid thighs. Computed tomography was used for attenuation correction and localization. Fusion imaging was obtained. RADIOPHARMACEUTICAL: 8.45 mCi F-18 flotufolastaf (Posluma ) injected intravenously. Uptake time 45 mins. COMPARISON: CTA chest 01/03/2019, nuclear medicine bone scan 10/13/2018, and CT abdomen and pelvis from 09/29/2018. CLINICAL HISTORY: Malignant neoplasm of prostate; prostate biopsy 07-18-23; PSA 7.17 ng/mL 08-08-23; no recent antibiotics/steroids; no history of radiation/chemotherapy. FINDINGS: PROSTATE AND  PROSTATE BED: Focal area of increased radiotracer uptake within the right side of the prostate gland extending into the apex has an SUV max of 7.2, image 159. LYMPH NODES: Tiny lymph node within the right external iliac nodal chain measures 6 mm and has an SUV max of 7.9, axial image 142. No tracer uptake in lymph nodes within the soft tissues of the neck or chest. BONES: Tracer avid lesion within the right scapula has an SUV max of 8.4, axial image 45. No corresponding lytic or sclerotic changes noted on the CT images. Within the left side of the occiput there is a small focus of increased tracer uptake with SUV max of 7.6, axial image 17. Within the L4 vertebral body corresponds to an area of discogenic sclerosis and schmorl's node deformity. OTHER PET FINDINGS: No signs of liver metastases. Chronic bilateral perinephric soft tissue stranding is identified, unchanged from previous imaging. Small stone within the inferior pole of the right kidney measures 3 mm, axial image 115. Circumferential wall thickening of the urinary bladder. 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 Overlying consolidation within the posterior and posterior medial left lung base is noted. Mosaic attenuation pattern identified throughout both lungs. Within the left upper lobe, there is a small nodule without increased tracer uptake measuring 5 mm, image 55/6. Also in the left upper lobe is a 4 mm nodule without tracer uptake, image 51/6. Nodular density in the left lower lobe measures 6 mm without corresponding increased uptake, image 77/6. IMPRESSION: 1. Focal area of increased radiotracer uptake within the right side of the prostate gland extending into the apex, with a corresponding SUV max of 7.2. imaging findings. This may reflect the patient's known prostate cancer. 2. Tiny lymph node within the right external iliac nodal chain with increased radiotracer uptake, SUV max of 7.9. Suspicious for  nodal metastasis. 3. 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 Electronically signed by: Waddell Calk MD 09/06/2023 01:28 PM EDT RP Workstation: [id number]   VAS US  CAROTID Result Date: 08/15/2023 Carotid Arterial Duplex Study Patient Name:  Bishoy Cupp  Date of Exam:   08/15/2023 Medical Rec #: [id number]          Accession #:    [id number] Date of Birth: 03/28/1955         Patient Gender: M Patient Age:   29 years Exam Location:  [address] Procedure:      VAS US  CAROTID Referring Phys: GORDY BERGAMO --------------------------------------------------------------------------------  Indications:       Carotid artery disease                    Right TCAR 07/07/2020 by Dr Gretta.                    Left TCAR 05/26/2020 by Dr Gretta Risk Factors:  Hypertension, hyperlipidemia, Diabetes, prior MI, coronary                    artery disease. Comparison Study:  10/06/2021 Dr Gretta VVS                    R=<50%, L=<50% Performing Technologist: Geni Lodge RVS, RCS  Examination Guidelines: A complete evaluation includes B-mode imaging, spectral Doppler, color Doppler, and power Doppler as needed of all accessible portions of each vessel. Bilateral testing is considered an integral part of a complete examination. Limited examinations for reoccurring indications may be performed as noted.  Right Carotid Findings: +----------+--------+--------+--------+------------------+--------+           PSV cm/sEDV cm/sStenosisPlaque DescriptionComments +----------+--------+--------+--------+------------------+--------+ CCA Prox  67      13                                         +----------+--------+--------+--------+------------------+--------+ CCA Mid   55      16                                         +----------+--------+--------+--------+------------------+--------+ CCA Distal65      18                                stent     +----------+--------+--------+--------+------------------+--------+ ICA Prox                                            stent    +----------+--------+--------+--------+------------------+--------+ ICA Mid                                             stent    +----------+--------+--------+--------+------------------+--------+ ECA       148     11                                         +----------+--------+--------+--------+------------------+--------+ +----------+--------+-------+----------------+-------------------+           PSV cm/sEDV cmsDescribe        Arm Pressure (mmHG) +----------+--------+-------+----------------+-------------------+ Subclavian               Multiphasic, [id number]                 +----------+--------+-------+----------------+-------------------+ +---------+--------+--+--------+--+---------+ VertebralPSV cm/s47EDV cm/s19Antegrade +---------+--------+--+--------+--+---------+  Right Stent(s): +---------------+--+--++++ Prox to Stent  6518 +---------------+--+--++++ Proximal Stent 4914 +---------------+--+--++++ Mid Stent      6124 +---------------+--+--++++ Distal Stent   7724 +---------------+--+--++++ Distal to [id number] +---------------+--+--++++   Left Carotid Findings: +----------+--------+--------+--------+------------------+--------+           PSV cm/sEDV cm/sStenosisPlaque DescriptionComments +----------+--------+--------+--------+------------------+--------+ CCA Prox  78      16                                         +----------+--------+--------+--------+------------------+--------+ CCA  Mid   64      15                                         +----------+--------+--------+--------+------------------+--------+ CCA Distal65      18                                Stent    +----------+--------+--------+--------+------------------+--------+ ICA Prox                                             Stent    +----------+--------+--------+--------+------------------+--------+ ICA Mid                                             Stent    +----------+--------+--------+--------+------------------+--------+ ICA Distal69      22                                         +----------+--------+--------+--------+------------------+--------+ ECA       176     0                                          +----------+--------+--------+--------+------------------+--------+ +----------+--------+--------+----------------+-------------------+           PSV cm/sEDV cm/sDescribe        Arm Pressure (mmHG) +----------+--------+--------+----------------+-------------------+ Subclavian107     0       Multiphasic, [id number]                 +----------+--------+--------+----------------+-------------------+ +---------+--------+--+--------+-+---------+ VertebralPSV cm/s14EDV cm/s1Antegrade +---------+--------+--+--------+-+---------+  Left Stent(s): +---------------+--+--++++ Prox to Stent  6518 +---------------+--+--++++ Proximal Stent 6019 +---------------+--+--++++ Mid Stent      8528 +---------------+--+--++++ Distal Stent   7725 +---------------+--+--++++ Distal to [id number] +---------------+--+--++++    Summary: Right Carotid: Right ICA stent is widely patent. Left Carotid: Left ICA stent is widely patent. Vertebrals:  Bilateral vertebral arteries demonstrate antegrade flow. Subclavians: Normal flow hemodynamics were seen in bilateral subclavian              arteries. *See table(s) above for measurements and observations. Suggest follow up study in 1 year if clinically indicated. NO change from study 10/06/2021. Electronically signed by Gordy Bergamo MD on 08/15/2023 at 4:46:19 PM.    Final       IMPRESSION/PLAN: 1. 68 y.o. 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 We discussed the patient's workup and outlined the nature of prostate cancer in this setting. The patient's T stage, Gleason's score, and PSA put him into the high risk group and there is evidence of oligo metastases in the bony skeleton and a small pelvic lymph node seen on PSMA PET scan for disease staging.  Since he has limited metastatic disease, he is eligible for definitive therapy with LT-ADT +/- ARPI for total androgen blockade, concurrent with either  8 weeks of external radiation and simultaneous stereotactic body radiotherapy (SBRT) to the oligometastatic lesions in the right scapula and left skull base or 5 weeks of external radiation and SBRT to the oligometastatic lesions in the right scapula and left skull base with an upfront brachytherapy boost.  He is not felt to be an ideal surgical candidate given the metastatic disease and multiple medical comorbidities.  Therefore, we discussed the available radiation techniques, and focused on the details and logistics of delivery. We discussed and outlined the risks, benefits, short and long-term effects associated with radiotherapy. We discussed the role of SpaceOAR gel in reducing the rectal toxicity associated with radiotherapy. We also detailed the role of ADT +/- ARPI in the treatment of high risk/oligometastatic prostate cancer and outlined the associated side effects that could be expected with this therapy.  With the understanding that there is an associated increased risk for cardiac disease and osteopenia/osteoporosis with androgen blockade, we do feel that the benefit of the concurrent treatment far outweigh the small, but real risks, in his case.  He will follow-up with his medical oncologist, Dr. Carollee, at the [facility], for initiation of ADT and further discussion regarding adding ARPI for total androgen blockade in light of his oligometastatic disease.  He appears to have a good understanding of his  disease and our treatment recommendations which are of curative intent.  He was encouraged to ask questions that were answered to his stated satisfaction.  At the conclusion of our conversation, the patient is interested in moving forward with brachytherapy boost and use of SpaceOAR gel followed by a 5 week course of daily radiotherapy to the prostate and pelvis and simultaneous SBRT to the oligometastatic skeletal disease, concurrent with LT-ADT +/- ARPI.  He lives in [place] and therefore prefers to have the external beam radiation treatment closer to home at [facility] cancer center so we will place a referral for a consult with Dr. Lenn, first available.  We will share our discussion with Dr. Sindy fry Dr. Carollee and coordinate for a follow up visit at the [facility], first available, to start ADT now. We will also coordinate with Dr. Renda  for the seed boost procedure in October 2025, approximately 2 months from the start of ADT.  The patient will be contacted by Orlean Gunner in our office who will be working closely with him to coordinate OR scheduling and pre and post procedure appointments.  We will contact the pharmaceutical rep to ensure that SpaceOAR is available at the time of procedure.  We will see him back 2-3 weeks after his seed boost procedure and he will then proceed with [facility] prostate with Dr. Lenn at [facility] for treatment planning in anticipation of beginning the 5 week course of daily external beam radiation and SBRT at that facility, 3-4 weeks after the seed boost procedure. He appears to have a good understanding of his disease and our treatment recommendations which are of curative intent and he is in agreement with the stated plan. .  We personally spent 70 minutes in this encounter including chart review, reviewing radiological studies, meeting face-to-face with the patient, entering orders and completing documentation.    Sabra MICAEL Rusk, PA-C    Donnice Barge, MD   [facility]  Radiation Oncology Direct Dial: [phone]  Fax: [phone] [email or web address]  Skype  LinkedIn

## 2023-09-08 NOTE — Progress Notes (Unsigned)
 TO BE COMPLETED BY RADIATION ONCOLOGIST OFFICE:   Patient Name: Martin Bush   Date of Birth: May 04, 1955   Radiation Oncologist: Dr. Donnice Barge  Site to be Treated: Prostate   Will x-rays >10 MV be used? No  Will the radiation be >10 cm from the device? Yes  Planned Treatment Start Date: 2 weeks  TO BE COMPLETED BY CARDIOLOGIST OFFICE:   Device Information:  Pacemaker []      ICD [x]    Brand: Medtronic: [phone] Model #: Cobalt [id number]  Serial Number: [id number]     Date of Placement: 03/08/2023  Site of Placement: Left Chest  Remote Device Check--Frequency: Every 3 months    Last Check: 07/13/23  Is the Patient Pacer Dependent?:  Yes []   No [x]   Does cardiologist request Radiation Oncology to schedule device testing by vendor for the following:  Prior to the Initiation of Treatments?  Yes []  No [x]  During Treatments?  Yes [x]  No []  Post Radiation Treatments?  Yes [x]  No []   Is device monitoring necessary by vendor/cardiologist team during treatments?  Yes [x]   No []   Is cardiac monitoring by Radiation Oncology nursing necessary during treatments? Yes [x]   No []   Do you recommend device be relocated prior to Radiation Treatment? Yes []   No [x]   **PLEASE LIST ANY NOTES OR SPECIAL REQUESTS: We will need to be aware when treatment schedule will end so we can schedule an in office device follow up visit with an App.      CARDIOLOGIST SIGNATURE:  Dr. Fonda Kitty  Per Device Clinic Standing Orders, Alan JAYSON Fees  09/08/2023 1:28 PM  **Please route completed form back to Radiation Oncology Nursing and P [facility] RAD ONC ADMIN, OR send an update if there will be a delay in having form completed by expected start date.  **Call [phone] if you have any questions or do not get an in-basket response from a Radiation Oncology staff member

## 2023-09-09 ENCOUNTER — Ambulatory Visit: Payer: No Typology Code available for payment source | Attending: Cardiovascular Disease

## 2023-09-09 DIAGNOSIS — I255 Ischemic cardiomyopathy: Secondary | ICD-10-CM

## 2023-09-12 ENCOUNTER — Ambulatory Visit
Admission: RE | Admit: 2023-09-12 | Discharge: 2023-09-12 | Disposition: A | Discharge status: Home / Self Care | Source: Ambulatory Visit | Attending: Radiation Oncology | Admitting: Radiation Oncology

## 2023-09-12 ENCOUNTER — Encounter: Payer: Self-pay | Admitting: Radiation Oncology

## 2023-09-12 ENCOUNTER — Ambulatory Visit (HOSPITAL_COMMUNITY)
Admission: RE | Admit: 2023-09-12 | Discharge: 2023-09-12 | Disposition: A | Discharge status: Home / Self Care | Source: Ambulatory Visit | Attending: Cardiology | Admitting: Cardiology

## 2023-09-12 ENCOUNTER — Ambulatory Visit: Payer: Self-pay | Admitting: Cardiology

## 2023-09-12 VITALS — BP 152/85 | HR 67 | Temp 98.3°F | Resp 16 | Wt 216.0 lb

## 2023-09-12 DIAGNOSIS — C61 Malignant neoplasm of prostate: Secondary | ICD-10-CM | POA: Insufficient documentation

## 2023-09-12 DIAGNOSIS — I5042 Chronic combined systolic (congestive) and diastolic (congestive) heart failure: Secondary | ICD-10-CM | POA: Insufficient documentation

## 2023-09-12 DIAGNOSIS — Z7982 Long term (current) use of aspirin: Secondary | ICD-10-CM | POA: Insufficient documentation

## 2023-09-12 DIAGNOSIS — Z923 Personal history of irradiation: Secondary | ICD-10-CM | POA: Insufficient documentation

## 2023-09-12 DIAGNOSIS — I2510 Atherosclerotic heart disease of native coronary artery without angina pectoris: Secondary | ICD-10-CM | POA: Insufficient documentation

## 2023-09-12 DIAGNOSIS — I130 Hypertensive heart and chronic kidney disease with heart failure and stage 1 through stage 4 chronic kidney disease, or unspecified chronic kidney disease: Secondary | ICD-10-CM | POA: Insufficient documentation

## 2023-09-12 DIAGNOSIS — E1122 Type 2 diabetes mellitus with diabetic chronic kidney disease: Secondary | ICD-10-CM | POA: Insufficient documentation

## 2023-09-12 DIAGNOSIS — Z79899 Other long term (current) drug therapy: Secondary | ICD-10-CM | POA: Diagnosis not present

## 2023-09-12 DIAGNOSIS — Z794 Long term (current) use of insulin: Secondary | ICD-10-CM | POA: Diagnosis not present

## 2023-09-12 DIAGNOSIS — N183 Chronic kidney disease, stage 3 unspecified: Secondary | ICD-10-CM | POA: Diagnosis not present

## 2023-09-12 DIAGNOSIS — I255 Ischemic cardiomyopathy: Secondary | ICD-10-CM | POA: Insufficient documentation

## 2023-09-12 DIAGNOSIS — Z7985 Long-term (current) use of injectable non-insulin antidiabetic drugs: Secondary | ICD-10-CM | POA: Diagnosis not present

## 2023-09-12 DIAGNOSIS — Z7984 Long term (current) use of oral hypoglycemic drugs: Secondary | ICD-10-CM | POA: Diagnosis not present

## 2023-09-12 DIAGNOSIS — E1151 Type 2 diabetes mellitus with diabetic peripheral angiopathy without gangrene: Secondary | ICD-10-CM | POA: Insufficient documentation

## 2023-09-12 LAB — ECHOCARDIOGRAM COMPLETE
Area-P 1/2: 3.47 cm2
P 1/2 time: 439 ms
S' Lateral: 4.7 cm
Weight: 3456 [oz_av]

## 2023-09-12 NOTE — Progress Notes (Signed)
 RN spoke with patient and patient's wife.  Had consult visit with Dr. Lenn today for discussion of 5 weeks IMRT post ADT and brachy boost.   [facility].  RN will follow up after to ensure treatment start.

## 2023-09-12 NOTE — Consult Note (Signed)
 NEW PATIENT EVALUATION  Name: Martin Bush  MRN: [id number]  Date:   09/12/2023     DOB: 1955/04/23   This 68 y.o. male patient presents to the clinic for initial evaluation of stage IVb ([id number]) Gleason 9 (4+5) adenocarcinoma the prostate presenting with a PSA of 7.1.  REFERRING PHYSICIAN: Clinic, [facility]ien  CHIEF COMPLAINT:  Chief Complaint  Patient presents with   Prostate Cancer    DIAGNOSIS: The encounter diagnosis was Malignant neoplasm of prostate (HCC).   PREVIOUS INVESTIGATIONS:  PSMA PET scan reviewed Pathology reports reviewed Clinical notes reviewed  HPI: Patient is a 68 year old male with multiple comorbidities including chronic combined systolic and diastolic heart failure chronic renal disease stage III diabetes mellitus ischemic cardiomyopathy NSTEMI.  He presented with a PSA in the 4 range back in July 2023 which was monitored.  In July 2025 his PSA remained elevated at 7.1 underwent transrectal ultrasound-guided biopsy with a 64 cc prostate with 6 out of 14 cores positive with 6 of 8 samples positive for Gleason 9 (4+5).  PSMA PET scan was performed showing increased radiotracer uptake in the right side of the prostate gland increased tracer activity in right external iliac nodal chain and tracer avid bone lesions within the right scapula and left base of skull.  He has been seen by Dr. Patrcia in [place] with recommendation for ADT therapy which will be provided by the [facility] as well as brachytherapy boost initially followed by course of radiation therapy to his prostate and pelvic nodes as well as SBRT treatment to his oligometastatic prostate cancer.  He is seen today for evaluation.  He is doing fairly well no significant lower urinary tract complaints no problems with his bowels and is having no bone pain at this time.  PLANNED TREATMENT REGIMEN: ADT therapy plus upfront interstitial implant to his prostate for boost followed by image guided IMRT  radiation therapy to his prostate and pelvic nodes followed by SBRT to his oligometastatic prostate cancer as per Dr. Patrcia  PAST MEDICAL HISTORY:  has a past medical history of Arthritis, Chronic combined systolic and diastolic heart failure (HCC), Chronic kidney disease, Colon polyps, Depression, Diabetes mellitus without complication (HCC), Elevated PSA, History of hiatal hernia, Hyperlipidemia, Hypertension, ICD; Biventricular  Medtronic ICD Amplia MRI QWuad CRT-D  in situ 10/29/14 (10/29/2014), Ischemic cardiomyopathy (09/27/2018), NSTEMI (non-ST elevated myocardial infarction) (HCC) (07/16/2014), OSA on CPAP, Peripheral vascular disease (HCC), Prostate cancer (HCC) (07/25/2023), and Proteinuria.    PAST SURGICAL HISTORY:  Past Surgical History:  Procedure Laterality Date   BIOPSY  09/19/2018   Procedure: BIOPSY;  Surgeon: Eda Iha, MD;  Location: [facility] ENDOSCOPY;  Service: Gastroenterology;;   CARDIAC CATHETERIZATION N/A 07/16/2014   Procedure: Left Heart Cath and Coronary Angiography;  Surgeon: Gordy Bergamo, MD;  Location: [facility] INVASIVE CV LAB;  Service: Cardiovascular;  Laterality: N/A;   CARDIAC CATHETERIZATION  07/16/2014   Procedure: Coronary Balloon Angioplasty;  Surgeon: Gordy Bergamo, MD;  Location: [facility] INVASIVE CV LAB;  Service: Cardiovascular;;   CARDIAC CATHETERIZATION  2003   CARDIAC DEFIBRILLATOR PLACEMENT  2016   CATARACT EXTRACTION Left 2018   CATARACT EXTRACTION W/ INTRAOCULAR LENS IMPLANT Left 03/2014   COLONOSCOPY     COLONOSCOPY WITH PROPOFOL  N/A 09/19/2018   Procedure: COLONOSCOPY WITH PROPOFOL ;  Surgeon: Eda Iha, MD;  Location: [facility];  Service: Gastroenterology;  Laterality: N/A;   COLONOSCOPY WITH PROPOFOL  N/A 05/12/2023   Procedure: COLONOSCOPY WITH PROPOFOL ;  Surgeon: Wilhelmenia Aloha Raddle., MD;  Location: [facility] ENDOS[facility];  Service: Gastroenterology;  Laterality: N/A;   CORONARY ANGIOPLASTY WITH STENT PLACEMENT     I've got a total of 8 stents in  there; mostly doine at [facility] (08/02/2017)   CORONARY ARTERY BYPASS GRAFT  ~ 2003   CABG X2; [facility]   CORONARY ATHERECTOMY N/A 08/16/2017   Procedure: CORONARY ATHERECTOMY;  Surgeon: Elmira Newman PARAS, MD;  Location: [facility] INVASIVE CV LAB;  Service: Cardiovascular;  Laterality: N/A;   CORONARY BALLOON ANGIOPLASTY N/A 08/04/2017   Procedure: CORONARY BALLOON ANGIOPLASTY;  Surgeon: Ladona Heinz, MD;  Location: [facility] INVASIVE CV LAB;  Service: Cardiovascular;  Laterality: N/A;   CORONARY BALLOON ANGIOPLASTY N/A 01/11/2019   Procedure: CORONARY BALLOON ANGIOPLASTY;  Surgeon: Ladona Heinz, MD;  Location: [facility] INVASIVE CV LAB;  Service: Cardiovascular;  Laterality: N/A;   CORONARY BALLOON ANGIOPLASTY N/A 10/09/2019   Procedure: CORONARY BALLOON ANGIOPLASTY;  Surgeon: Ladona Heinz, MD;  Location: [facility] INVASIVE CV LAB;  Service: Cardiovascular;  Laterality: N/A;   CORONARY STENT INTERVENTION N/A 08/16/2017   Procedure: CORONARY STENT INTERVENTION;  Surgeon: Elmira Newman PARAS, MD;  Location: [facility] INVASIVE CV LAB;  Service: Cardiovascular;  Laterality: N/A;   CORONARY STENT INTERVENTION N/A 10/09/2019   Procedure: CORONARY STENT INTERVENTION;  Surgeon: Ladona Heinz, MD;  Location: [facility] INVASIVE CV LAB;  Service: Cardiovascular;  Laterality: N/A;   ELBOW SURGERY Left ?2001   pinched nerve   ENDOSCOPIC MUCOSAL RESECTION  12/18/2018   Procedure: ENDOSCOPIC MUCOSAL RESECTION;  Surgeon: Wilhelmenia Aloha Raddle., MD;  Location: [facility] ENDOSCOPY;  Service: Gastroenterology;;   EYE SURGERY Left    cataract removal   FLEXIBLE SIGMOIDOSCOPY N/A 12/18/2018   Procedure: FLEXIBLE SIGMOIDOSCOPY;  Surgeon: Wilhelmenia Aloha Raddle., MD;  Location: [facility] ENDOSCOPY;  Service: Gastroenterology;  Laterality: N/A;   HEMOSTASIS CLIP PLACEMENT  12/18/2018   Procedure: HEMOSTASIS CLIP PLACEMENT;  Surgeon: Wilhelmenia Aloha Raddle., MD;  Location: [facility] ENDOSCOPY;  Service: Gastroenterology;;   ICD GENERATOR CHANGEOUT N/A 03/08/2023    Procedure: ICD GENERATOR CHANGEOUT;  Surgeon: Kennyth Chew, MD;  Location: [facility] INVASIVE CV LAB;  Service: Cardiovascular;  Laterality: N/A;   LEFT HEART CATH AND CORONARY ANGIOGRAPHY N/A 01/11/2019   Procedure: LEFT HEART CATH AND CORONARY ANGIOGRAPHY;  Surgeon: Ladona Heinz, MD;  [facility] CV LAB;  Service: Cardiovascular;  Laterality: N/A;   LEFT HEART CATH AND CORS/GRAFTS ANGIOGRAPHY N/A 08/04/2017   Procedure: LEFT HEART CATH AND CORS/GRAFTS ANGIOGRAPHY;  Surgeon: Ladona Heinz, MD;  [facility] INVASIVE CV LAB;  Service: Cardiovascular;  Laterality: N/A;   LEFT HEART CATH AND CORS/GRAFTS ANGIOGRAPHY N/A 08/20/2017   Procedure: LEFT HEART CATH AND CORS/GRAFTS ANGIOGRAPHY;  Surgeon: Elmira Newman PARAS, MD;  [facility]VASIVE CV LAB;  Service: Cardiovascular;  Laterality: N/A;   LEFT HEART CATH AND CORS/GRAFTS ANGIOGRAPHY N/A 01/08/2019   Procedure: LEFT HEART CATH AND CORS/GRAFTS ANGIOGRAPHY;  Surgeon: Darron Deatrice LABOR, MD;  Location: [facility] INVASIVE CV LAB;  Service: Cardiovascular;  Laterality: N/A;   LEFT HEART CATH AND CORS/GRAFTS ANGIOGRAPHY N/A 10/09/2019   Procedure: LEFT HEART CATH AND CORS/GRAFTS ANGIOGRAPHY;  Surgeon: Ladona Heinz, MD;  [facility]VASIVE CV LAB;  Service: Cardiovascular;  Laterality: N/A;   LEFT HEART CATH AND CORS/GRAFTS ANGIOGRAPHY N/A 12/11/2019   Procedure: LEFT HEART CATH AND CORS/GRAFTS ANGIOGRAPHY;  Surgeon: Elmira Newman PARAS, MD;  Location[facility] INVASIVE CV LAB;  Service: Cardiovascular;  Laterality: N/A;   POLYPECTOMY  09/19/2018   Procedure: POLYPECTOMY;  Surgeon: Eda Iha, MD;  Location: [facility]OS[facility];  Service: Gastroenterology;;   POLYPECTOMY  05/12/2023   Procedure: POLYPECTOMY, INTESTINE;  Surgeon: Wilhelmenia Aloha Raddle., MD;  Location: [facility];  Service: Gastroenterology;;   PROSTATE BIOPSY     SUBMUCOSAL INJECTION  09/19/2018   Procedure: SUBMUCOSAL INJECTION;  Surgeon: Eda Iha, MD;  Location: [facility] ENDOSCOPY;   Service: Gastroenterology;;   ROBLEY LIFTING INJECTION  12/18/2018   Procedure: SUBMUCOSAL LIFTING INJECTION;  Surgeon: Wilhelmenia Aloha Raddle., MD;  Location: [facility] ENDOSCOPY;  Service: Gastroenterology;;   TRANSCAROTID ARTERY REVASCULARIZATION  Left 05/26/2020   Procedure: LEFT TRANSCAROTID ARTERY REVASCULARIZATION;  Surgeon: Gretta Lonni PARAS, MD;  Location: [facility] OR;  Service: Vascular;  Laterality: Left;   TRANSCAROTID ARTERY REVASCULARIZATION  Right 07/07/2020   Procedure: RIGHT TRANSCAROTID ARTERY REVASCULARIZATION;  Surgeon: Gretta Lonni PARAS, MD;  Location: [facility] OR;  Service: Vascular;  Laterality: Right;   ULTRASOUND GUIDANCE FOR VASCULAR ACCESS Right 05/26/2020   Procedure: ULTRASOUND GUIDANCE FOR VASCULAR ACCESS;  Surgeon: Gretta Lonni PARAS, MD;  Location: [facility] OR;  Service: Vascular;  Laterality: Right;   ULTRASOUND GUIDANCE FOR VASCULAR ACCESS Left 07/07/2020   Procedure: ULTRASOUND GUIDANCE FOR VASCULAR ACCESS;  Surgeon: Gretta Lonni PARAS, MD;  Location: [facility] OR;  Service: Vascular;  Laterality: Left;    FAMILY HISTORY: family history includes Diabetes in his father; Heart disease in his father; Hyperlipidemia in his father; Hypertension in his brother, father, and sister; Lung cancer in his mother; Uterine cancer in his mother.  SOCIAL HISTORY:  reports that he quit smoking about 23 years ago. His smoking use included cigars and cigarettes. He started smoking about 50 years ago. He has a 6.8 pack-year smoking history. He quit smokeless tobacco use about 23 years ago.  His smokeless tobacco use included chew. He reports that he does not currently use alcohol. He reports that he does not use drugs.  ALLERGIES: Diltiazem  MEDICATIONS:  Current Outpatient Medications  Medication Sig Dispense Refill   amiodarone  (PACERONE ) 200 MG tablet Take 0.5 tablets (100 mg total) by mouth daily. 45 tablet 3   amLODipine  (NORVASC ) 5 MG tablet TAKE 1 TABLET BY MOUTH DAILY 90 tablet 2    aspirin  EC 81 MG EC tablet Take 1 tablet (81 mg total) by mouth daily.     carvedilol  (COREG ) 25 MG tablet Take 1 tablet (25 mg total) by mouth 2 (two) times daily with a meal. 180 tablet 3   Cholecalciferol  (VITAMIN D3) 50 MCG (2000 UT) TABS Take 2,000 Units by mouth 2 (two) times daily.      Cyanocobalamin  (VITAMIN B-12) 2500 MCG SUBL Take 2,500 mcg by mouth in the morning.     empagliflozin  (JARDIANCE ) 25 MG TABS tablet Take 1 tablet (25 mg total) by mouth daily before breakfast. 90 tablet 3   ezetimibe  (ZETIA ) 10 MG tablet Take 1 tablet (10 mg total) by mouth daily. 90 tablet 3   ferrous sulfate  325 (65 FE) MG tablet Take 325 mg by mouth every Monday, Wednesday, and Friday. In the morning     glucose 4 GM chewable tablet 1 tablet once as needed for low blood sugar.     insulin  isophane & regular human (NOVOLIN 70/30 FLEXPEN) (70-30) 100 UNIT/ML KwikPen Inject 0-30 Units into the skin 2 (two) times daily as needed (high blood sugar).     metFORMIN  (GLUCOPHAGE -XR) 500 MG 24 hr tablet Take 500 mg by mouth 2 (two) times daily.     nitroGLYCERIN  (NITROSTAT ) 0.4 MG SL tablet Place 1 tablet (0.4 mg total) under the tongue every 5 (five) minutes x 3 doses  as needed for chest pain. 25 tablet 3   Omega-3 Fatty Acids (FISH OIL) 1000 MG CAPS Take 2,000 mg by mouth 3 (three) times daily.     OXYGEN  Inhale 2 L/min into the lungs See admin instructions. 2 L/min at bedtime in conjunction with CPAP      PRESCRIPTION MEDICATION Inhale into the lungs See admin instructions. CPAP- At bedtime     rosuvastatin  (CRESTOR ) 40 MG tablet Take 1 tablet (40 mg total) by mouth at bedtime.     sacubitril -valsartan  (ENTRESTO ) 49-51 MG Take 1 tablet by mouth 2 (two) times daily. 60 tablet 0   sertraline  (ZOLOFT ) 50 MG tablet Take 50 mg by mouth at bedtime.     tirzepatide (MOUNJARO) 7.5 MG/0.5ML Pen Inject 7.5 mg into the skin once a week.     No current facility-administered medications for this encounter.    ECOG  PERFORMANCE STATUS:  0 - Asymptomatic  REVIEW OF SYSTEMS: Patient denies any weight loss, fatigue, weakness, fever, chills or night sweats. Patient denies any loss of vision, blurred vision. Patient denies any ringing  of the ears or hearing loss. No irregular heartbeat. Patient denies heart murmur or history of fainting. Patient denies any chest pain or pain radiating to her upper extremities. Patient denies any shortness of breath, difficulty breathing at night, cough or hemoptysis. Patient denies any swelling in the lower legs. Patient denies any nausea vomiting, vomiting of blood, or coffee ground material in the vomitus. Patient denies any stomach pain. Patient states has had normal bowel movements no significant constipation or diarrhea. Patient denies any dysuria, hematuria or significant nocturia. Patient denies any problems walking, swelling in the joints or loss of balance. Patient denies any skin changes, loss of hair or loss of weight. Patient denies any excessive worrying or anxiety or significant depression. Patient denies any problems with insomnia. Patient denies excessive thirst, polyuria, polydipsia. Patient denies any swollen glands, patient denies easy bruising or easy bleeding. Patient denies any recent infections, allergies or URI. Patient s visual fields have not changed significantly in recent time.   PHYSICAL EXAM: BP (!) 152/85   Pulse 67   Temp 98.3 F (36.8 C)   Resp 16   Wt 216 lb (98 kg)   BMI 33.83 kg/m  Mildly obese male in NAD.  Well-developed well-nourished patient in NAD. HEENT reveals PERLA, EOMI, discs not visualized.  Oral cavity is clear. No oral mucosal lesions are identified. Neck is clear without evidence of cervical or supraclavicular adenopathy. Lungs are clear to A&P. Cardiac examination is essentially unremarkable with regular rate and rhythm without murmur rub or thrill. Abdomen is benign with no organomegaly or masses noted. Motor sensory and DTR levels  are equal and symmetric in the upper and lower extremities. Cranial nerves II through XII are grossly intact. Proprioception is intact. No peripheral adenopathy or edema is identified. No motor or sensory levels are noted. Crude visual fields are within normal range.  LABORATORY DATA: Pathology reports reviewed    RADIOLOGY RESULTS: PSMA PET scan reviewed compatible with above-stated findings   IMPRESSION: Stage IVb Gleason 9 adenocarcinoma the prostate presenting with a PSA in the 7 range in 68 year old male  PLAN: At this time I will refer patient back to Dr. Patrcia to proceed with his boost to his prostate gland.  Once his interstitial implant is complete we will see him back for planning of IMRT radiation therapy to his prostate and pelvic nodes followed by SBRT treatment to his 2  areas of oligometastatic disease.  Risks and benefits of treatment including increased lower Neri tract symptoms diarrhea fatigue alteration blood counts skin reaction all were reviewed with the patient and his wife.  They comprehend our treatment plan well.  I would like to take this opportunity to thank you for allowing me to participate in the care of your patient.SABRA Marcey Penton, MD

## 2023-09-12 NOTE — Progress Notes (Signed)
 Very stable echocardiogram in fact heart function appears to have slightly improved as well.  Continue present medication.  Continue to watch her weight and to get some weight off which will certainly help with heart failure.

## 2023-09-13 LAB — CUP PACEART REMOTE DEVICE CHECK
Battery Remaining Longevity: 110 mo
Battery Voltage: 3.03 V
Brady Statistic RV Percent Paced: 96.59 %
Date Time Interrogation Session: 20250815035106
HighPow Impedance: 58 Ohm
Implantable Lead Connection Status: 753985
Implantable Lead Connection Status: 753985
Implantable Lead Connection Status: 753985
Implantable Lead Implant Date: 20161024
Implantable Lead Implant Date: 20161024
Implantable Lead Implant Date: 20161024
Implantable Lead Location: 753858
Implantable Lead Location: 753859
Implantable Lead Location: 753860
Implantable Lead Model: 4598
Implantable Lead Model: 5076
Implantable Pulse Generator Implant Date: 20250211
Lead Channel Impedance Value: 304 Ohm
Lead Channel Impedance Value: 342 Ohm
Lead Channel Impedance Value: 361 Ohm
Lead Channel Impedance Value: 399 Ohm
Lead Channel Impedance Value: 475 Ohm
Lead Channel Impedance Value: 494 Ohm
Lead Channel Impedance Value: 532 Ohm
Lead Channel Impedance Value: 646 Ohm
Lead Channel Impedance Value: 665 Ohm
Lead Channel Impedance Value: 779 Ohm
Lead Channel Impedance Value: 779 Ohm
Lead Channel Impedance Value: 817 Ohm
Lead Channel Impedance Value: 836 Ohm
Lead Channel Pacing Threshold Amplitude: 0.5 V
Lead Channel Pacing Threshold Amplitude: 0.875 V
Lead Channel Pacing Threshold Amplitude: 1 V
Lead Channel Pacing Threshold Pulse Width: 0.4 ms
Lead Channel Pacing Threshold Pulse Width: 0.4 ms
Lead Channel Pacing Threshold Pulse Width: 0.4 ms
Lead Channel Sensing Intrinsic Amplitude: 11.3 mV
Lead Channel Sensing Intrinsic Amplitude: 2.8 mV
Lead Channel Setting Pacing Amplitude: 1.75 V
Lead Channel Setting Pacing Amplitude: 1.75 V
Lead Channel Setting Pacing Amplitude: 2 V
Lead Channel Setting Pacing Pulse Width: 0.4 ms
Lead Channel Setting Pacing Pulse Width: 0.4 ms
Lead Channel Setting Sensing Sensitivity: 0.3 mV
Zone Setting Status: 755011
Zone Setting Status: 755011
Zone Setting Status: 755011

## 2023-09-19 ENCOUNTER — Ambulatory Visit: Payer: Self-pay | Admitting: Cardiology

## 2023-09-19 ENCOUNTER — Telehealth: Payer: Self-pay | Admitting: Radiation Oncology

## 2023-09-19 NOTE — Telephone Encounter (Signed)
 8/25 @ 2:46 pm patient's wife called with questions concerning patient Dx.  She requested to speak to [place].  Forward call as requested.

## 2023-09-20 NOTE — Progress Notes (Signed)
 Patient started Orgovyx on 8/23.  RN returned patient's wife call, voicemail left for call back.

## 2023-09-30 NOTE — Progress Notes (Signed)
 RN returned patient's wife call. She did confirm start of Orgovyx.  RN reviewed next steps with brachy boost.  All questions answered.  No additional needs at this time.

## 2023-10-04 ENCOUNTER — Telehealth: Payer: Self-pay | Admitting: *Deleted

## 2023-10-04 ENCOUNTER — Other Ambulatory Visit: Payer: Self-pay | Admitting: Urology

## 2023-10-04 NOTE — Telephone Encounter (Signed)
Called patient to inform of pre-seed appts. and implant date, spoke with patient and he is aware of these appts.

## 2023-10-05 ENCOUNTER — Telehealth: Payer: Self-pay | Admitting: Cardiology

## 2023-10-05 NOTE — Telephone Encounter (Signed)
   Patient Name: Martin Bush  DOB: [birth year] MRN: [id number]  Primary Cardiologist: Gordy Bergamo, MD  Chart reviewed as part of pre-operative protocol coverage. Given past medical history and time since last visit, based on ACC/AHA guidelines, Rodrigus Kilker is at acceptable risk for the planned procedure without further cardiovascular testing.   Per office protocol, if patient is without any new symptoms or concerns at the time of their virtual visit, he may hold ASA for 5 days prior to procedure. Please resume ASA as soon as possible postprocedure, at the discretion of the surgeon.    The patient was advised that if he develops new symptoms prior to surgery to contact our office to arrange for a follow-up visit, and he verbalized understanding.  I will route this recommendation to the requesting party via Epic fax function and remove from pre-op  pool.  Please call with questions.  Lamarr Satterfield, NP 10/05/2023, 9:17 AM

## 2023-10-05 NOTE — Telephone Encounter (Signed)
   Pre-operative Risk Assessment    Patient Name: Martin Bush  DOB: 08/04/1955 MRN: [id number]      Request for Surgical Clearance    Procedure:  Brachytherapy and faceoar cystoscopy  Date of Surgery:  Clearance 11/24/23                                 Surgeon: Dr. Renda Socks Group or Practice Name: [facility] Phone number: [phone] Fax number: [phone]   Type of Clearance Requested:   - Medical  - Pharmacy:  Hold Aspirin  5 days   Type of Anesthesia:  General    Additional requests/questions:     Bonney Willie Daring   10/05/2023, 9:03 AM

## 2023-10-19 NOTE — Progress Notes (Signed)
Remote ICD Transmission.

## 2023-11-01 ENCOUNTER — Telehealth: Payer: Self-pay | Admitting: *Deleted

## 2023-11-01 NOTE — Telephone Encounter (Signed)
 Called patient to remind of pre-seed appts. for 11-03-23, spoke with patient's wife-Nancy and she is aware of these appts.

## 2023-11-01 NOTE — Progress Notes (Incomplete)
 Pre-seed nursing interview for a diagnosis:  68 y.o. 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   Patient identity verified x2.   Patient states issues as follows...  -Pain: some lower back pain (rated 3) -Fatigue: None -Abdomen: None -Groin: None -Urinary: None -Bowels: None -Appetite: Good Weight 216.6  Patient denies all other related issues at this time.  Meaningful use complete.  Urinary Management medication(s)- None Urology appointment date- No upcoming appointments with Dr. Marcey Penton but saw him a few weeks ago per patient  No vitals needed for this visit.  This concludes the interaction.  Patient declines to watch the pre-seed educational video.

## 2023-11-03 ENCOUNTER — Encounter: Payer: Self-pay | Admitting: Radiation Oncology

## 2023-11-03 ENCOUNTER — Ambulatory Visit
Admission: RE | Admit: 2023-11-03 | Discharge: 2023-11-03 | Disposition: A | Discharge status: Home / Self Care | Source: Ambulatory Visit | Attending: Radiation Oncology | Admitting: Radiation Oncology

## 2023-11-03 ENCOUNTER — Ambulatory Visit
Admission: RE | Admit: 2023-11-03 | Discharge: 2023-11-03 | Disposition: A | Discharge status: Home / Self Care | Payer: Self-pay | Source: Ambulatory Visit | Attending: Urology | Admitting: Urology

## 2023-11-03 DIAGNOSIS — C61 Malignant neoplasm of prostate: Secondary | ICD-10-CM

## 2023-11-03 DIAGNOSIS — C775 Secondary and unspecified malignant neoplasm of intrapelvic lymph nodes: Secondary | ICD-10-CM | POA: Diagnosis present

## 2023-11-03 DIAGNOSIS — C7951 Secondary malignant neoplasm of bone: Secondary | ICD-10-CM | POA: Insufficient documentation

## 2023-11-03 NOTE — Progress Notes (Signed)
 Radiation Oncology         [phone] ________________________________  Outpatient Follow up- Pre-seed visit  Name: Martin Bush MRN: [id number]  Date: 11/03/2023  DOB: 1955/05/16  RR:Ropwpr, Bonni Refugia Martin Frederic Fairy, MD   REFERRING PHYSICIAN: Sindy Frederic Fairy, MD  DIAGNOSIS: 68 y.o. 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     ICD-10-CM   1. Malignant neoplasm of prostate (HCC)  C61       HISTORY OF PRESENT ILLNESS: Martin Bush is a 68 y.o. male with a diagnosis of prostate cancer. He has a history of elevated PSA since at least 08/13/2021 when the PSA was 4.1.  The PSA has been steadily rising over the years, at 5.2 in October 2023, 5.3 on April 12, 2022, and remained elevated at 6.1 when repeated on April 16, 2022 and 6.56 in May 2025.  Accordingly, he was referred for evaluation in urology by Dr. Cathye in 05/2023 and  digital rectal examination performed at that time showed no nodularity or concerning findings.  A repeat PSA 08/08/2023 remained elevated at 7.17.  Therefore, the patient proceeded to transrectal ultrasound with 12 biopsies of the prostate on 07/18/2023.  The prostate volume measured 64 cc.  Out of 14 core biopsies, 6 were positive, all on the right.  The maximum Gleason score was 4+5, and this was seen in 6 of 8 samples from the right lobe of the prostate.  He met with Dr. Carollee (medical oncology) and Dr. Sindy (radiation oncology) at the [facility], to discuss treatment options and a PSMA PET was recommended to complete his disease staging.   A PSMA PET scan was performed on 08/31/2023 for disease staging and confirmed oligometastatic disease in the right scapula, left skull base and a solitary, 6 mm right external iliac lymph node.  The patient reviewed the biopsy results with his urologist and was kindly referred to us   for discussion of potential radiation treatment options. We initially met the patient on 09/08/23 and he was most interested in proceeding with brachytherapy boost and use of SpaceOAR gel followed by a 5 week course of daily radiotherapy to the prostate and pelvis and simultaneous SBRT to the oligometastatic skeletal disease with Dr. Lenn in [place], concurrent with LT-ADT +/- ARPI for treatment of his disease. He started Orgovyx ADT on 09/17/2023 and is here today for his pre-procedure imaging for planning and to answer any additional questions he may have about this treatment.   PREVIOUS RADIATION THERAPY: No  PAST MEDICAL HISTORY:  Past Medical History:  Diagnosis Date   Arthritis    thumbs  (08/02/2017)   Chronic combined systolic and diastolic heart failure (HCC)    a. 08/2017 Echo: EF 20-25%, mod glob HK. Sev distal ant sept, inflat HK. Apical AK. Gr2 DD. Mildly reduced RV fxn.   Chronic kidney disease    III, stage 3   Colon polyps    Depression    Diabetes mellitus without complication (HCC)    Elevated PSA    History of hiatal hernia    Hyperlipidemia    Hypertension    ICD; Biventricular  Medtronic ICD Amplia MRI QWuad CRT-D  in situ 10/29/14 10/29/2014   Ischemic cardiomyopathy 09/27/2018   NSTEMI (non-ST elevated myocardial infarction) (HCC) 07/16/2014   OSA on CPAP    Peripheral vascular disease    Prostate cancer (HCC)  07/25/2023   [facility]   Proteinuria       PAST SURGICAL HISTORY: Past Surgical History:  Procedure Laterality Date   BIOPSY  09/19/2018   Procedure: BIOPSY;  Surgeon: Eda Iha, MD;  Location: [facility];  Service: Gastroenterology;;   CARDIAC CATHETERIZATION N/A 07/16/2014   Procedure: Left Heart Cath and Coronary Angiography;  Surgeon: Gordy Bergamo, MD;  Location: [facility] INVASIVE CV LAB;  Service: Cardiovascular;  Laterality: N/A;   CARDIAC CATHETERIZATION  07/16/2014   Procedure: Coronary Balloon Angioplasty;  Surgeon: Gordy Bergamo, MD;  Location:  [facility] INVASIVE CV LAB;  Service: Cardiovascular;;   CARDIAC CATHETERIZATION  2003   CARDIAC DEFIBRILLATOR PLACEMENT  2016   CATARACT EXTRACTION Left 2018   CATARACT EXTRACTION W/ INTRAOCULAR LENS IMPLANT Left 03/2014   COLONOSCOPY     COLONOSCOPY WITH PROPOFOL  N/A 09/19/2018   Procedure: COLONOSCOPY WITH PROPOFOL ;  Surgeon: Eda Iha, MD;  Location: [facility];  Service: Gastroenterology;  Laterality: N/A;   COLONOSCOPY WITH PROPOFOL  N/A 05/12/2023   Procedure: COLONOSCOPY WITH PROPOFOL ;  Surgeon: Mansouraty, Aloha Raddle., MD;  Location: [facility];  Service: Gastroenterology;  Laterality: N/A;   CORONARY ANGIOPLASTY WITH STENT PLACEMENT     I've got a total of 8 stents in there; mostly doine at [facility] (08/02/2017)   CORONARY ARTERY BYPASS GRAFT  ~ 2003   CABG X2; [facility]   CORONARY ATHERECTOMY N/A 08/16/2017   Procedure: CORONARY ATHERECTOMY;  Surgeon: Elmira Newman PARAS, MD;  Location: [facility] INVASIVE CV LAB;  Service: Cardiovascular;  Laterality: N/A;   CORONARY BALLOON ANGIOPLASTY N/A 08/04/2017   Procedure: CORONARY BALLOON ANGIOPLASTY;  Surgeon: Bergamo Gordy, MD;  Location: [facility] INVASIVE CV LAB;  Service: Cardiovascular;  Laterality: N/A;   CORONARY BALLOON ANGIOPLASTY N/A 01/11/2019   Procedure: CORONARY BALLOON ANGIOPLASTY;  Surgeon: Bergamo Gordy, MD;  Location: [facility] INVASIVE CV LAB;  Service: Cardiovascular;  Laterality: N/A;   CORONARY BALLOON ANGIOPLASTY N/A 10/09/2019   Procedure: CORONARY BALLOON ANGIOPLASTY;  Surgeon: Bergamo Gordy, MD;  Location: [facility] INVASIVE CV LAB;  Service: Cardiovascular;  Laterality: N/A;   CORONARY STENT INTERVENTION N/A 08/16/2017   Procedure: CORONARY STENT INTERVENTION;  Surgeon: Elmira Newman PARAS, MD;  Location: [facility] INVASIVE CV LAB;  Service: Cardiovascular;  Laterality: N/A;   CORONARY STENT INTERVENTION N/A 10/09/2019   Procedure: CORONARY STENT INTERVENTION;  Surgeon: Bergamo Gordy, MD;  Location: [facility] INVASIVE CV LAB;  Service:  Cardiovascular;  Laterality: N/A;   ELBOW SURGERY Left ?2001   pinched nerve   ENDOSCOPIC MUCOSAL RESECTION  12/18/2018   Procedure: ENDOSCOPIC MUCOSAL RESECTION;  Surgeon: Wilhelmenia Aloha Raddle., MD;  Location: [facility] ENDOSCOPY;  Service: Gastroenterology;;   EYE SURGERY Left    cataract removal   FLEXIBLE SIGMOIDOSCOPY N/A 12/18/2018   Procedure: FLEXIBLE SIGMOIDOSCOPY;  Surgeon: Wilhelmenia Aloha Raddle., MD;  Location: [facility] ENDOSCOPY;  Service: Gastroenterology;  Laterality: N/A;   HEMOSTASIS CLIP PLACEMENT  12/18/2018   Procedure: HEMOSTASIS CLIP PLACEMENT;  Surgeon: Wilhelmenia Aloha Raddle., MD;  Location: [facility] ENDOSCOPY;  Service: Gastroenterology;;   ICD GENERATOR CHANGEOUT N/A 03/08/2023   Procedure: ICD GENERATOR CHANGEOUT;  Surgeon: Kennyth Chew, MD;  Location: [facility] INVASIVE CV LAB;  Service: Cardiovascular;  Laterality: N/A;   LEFT HEART CATH AND CORONARY ANGIOGRAPHY N/A 01/11/2019   Procedure: LEFT HEART CATH AND CORONARY ANGIOGRAPHY;  Surgeon: Bergamo Gordy, MD;  [facility] INVAS[facility] CV LAB;  Service: Cardiovascular;  Laterality: N/A;   LEFT HEART CATH AND CORS/GRAFTS ANGIOGRAPHY N/A 08/04/2017   Procedure: LEFT HEART CATH AND CORS/GRAFTS ANGIOGRAPHY;  Surgeon: Ladona Heinz, MD;  Location: [facility] INVASIVE CV LAB;  Service: Cardiovascular;  Laterality: N/A;   LEFT HEART CATH AND CORS/GRAFTS ANGIOGRAPHY N/A 08/20/2017   Procedure: LEFT HEART CATH AND CORS/GRAFTS ANGIOGRAPHY;  Surgeon: Elmira Newman PARAS, MD;  [facility]: [facility] INVASIVE CV LAB;  Service: Cardiovascular;  Laterality: N/A;   LEFT HEART CATH AND CORS/GRAFTS ANGIOGRAPHY N/A 01/08/2019   Procedure: LEFT HEART CATH AND CORS/GRAFTS ANGIOGRAPHY;  Surgeon: Darron Deatrice LABOR, MD;  Location: [facility] INVASIVE CV LAB;  Service: Cardiovascular;  Laterality: N/A;   LEFT HEART CATH AND CORS/GRAFTS ANGIOGRAPHY N/A 10/09/2019   Procedure: LEFT HEART CATH AND CORS/GRAFTS ANGIOGRAPHY;  Surgeon: Ladona Heinz, MD;  [facility] INVASIVE CV LAB;  Service:  Cardiovascular;  Laterality: N/A;   LEFT HEART CATH AND CORS/GRAFTS ANGIOGRAPHY N/A 12/11/2019   Procedure: LEFT HEART CATH AND CORS/GRAFTS ANGIOGRAPHY;  Surgeon: Elmira Newman PARAS, MD;  Location: [facility] INVASIVE CV LAB;  Service: Cardiovascular;  Laterality: N/A;   POLYPECTOMY  09/19/2018   Procedure: POLYPECTOMY;  Surgeon: Eda Iha, MD;  Location: [facility];  Service: Gastroenterology;;   POLYPECTOMY  05/12/2023   Procedure: POLYPECTOMY, INTESTINE;  Surgeon: Wilhelmenia Aloha Raddle., MD;  Location: [facility];  Service: Gastroenterology;;   PROSTATE BIOPSY     SUBMUCOSAL INJECTION  09/19/2018   Procedure: SUBMUCOSAL INJECTION;  Surgeon: Eda Iha, MD;  Location: [facility];  Service: Gastroenterology;;   ROBLEY LIFTING INJECTION  12/18/2018   Procedure: SUBMUCOSAL LIFTING INJECTION;  Surgeon: Wilhelmenia Aloha Raddle., MD;  Location: [facility] ENDOSCOPY;  Service: Gastroenterology;;   TRANSCAROTID ARTERY REVASCULARIZATION  Left 05/26/2020   Procedure: LEFT TRANSCAROTID ARTERY REVASCULARIZATION;  Surgeon: Gretta Lonni PARAS, MD;  Location: [facility] OR;  Service: Vascular;  Laterality: Left;   TRANSCAROTID ARTERY REVASCULARIZATION  Right 07/07/2020   Procedure: RIGHT TRANSCAROTID ARTERY REVASCULARIZATION;  Surgeon: Gretta Lonni PARAS, MD;  Location: [facility] OR;  Service: Vascular;  Laterality: Right;   ULTRASOUND GUIDANCE FOR VASCULAR ACCESS Right 05/26/2020   Procedure: ULTRASOUND GUIDANCE FOR VASCULAR ACCESS;  Surgeon: Gretta Lonni PARAS, MD;  Location[facility] OR;  Service: Vascular;  Laterality: Right;   ULTRASOUND GUIDANCE FOR VASCULAR ACCESS Left 07/07/2020   Procedure: ULTRASOUND GUIDANCE FOR VASCULAR ACCESS;  Surgeon: Gretta Lonni PARAS, MD;  Location: [facility] OR;  Service: Vascular;  Laterality: Left;    FAMILY HISTORY:  Family History  Problem Relation Age of Onset   Uterine cancer Mother    Lung cancer Mother    Hyperlipidemia Father    Heart disease Father     Hypertension Father    Diabetes Father    Hypertension Sister    Hypertension Brother    Colon cancer Neg Hx     SOCIAL HISTORY:  Social History   Socioeconomic History   Marital status: Married    Spouse name: Not on file   Number of children: 3   Years of education: Not on file   Highest education level: Not on file  Occupational History   Occupation: retired  Tobacco Use   Smoking status: Former    Current packs/day: 0.00    Average packs/day: 0.3 packs/day for 27.0 years (6.8 ttl pk-yrs)    Types: Cigars, Cigarettes    Start date: 42    Quit date: 2002    Years since quitting: 23.7   Smokeless tobacco: Former    Types: Chew    Quit date: 2002  Vaping Use   Vaping status: Never Used  Substance and Sexual Activity   Alcohol use: Not Currently  Drug use: Never   Sexual activity: Not Currently  Other Topics Concern   Not on file  Social History Narrative   Not on file   Social Drivers of Health   Financial Resource Strain: Low Risk  (12/05/2020)   Overall Financial Resource Strain (CARDIA)    Difficulty of Paying Living Expenses: Not hard at all  Food Insecurity: No Food Insecurity (09/08/2023)   Hunger Vital Sign    Worried About Running Out of Food in the Last Year: Never true    Ran Out of Food in the Last Year: Never true  Transportation Needs: No Transportation Needs (09/08/2023)   PRAPARE - Administrator, Civil Service (Medical): No    Lack of Transportation (Non-Medical): No  Physical Activity: Unknown (04/25/2017)   Exercise Vital Sign    Days of Exercise per Week: 0 days    Minutes of Exercise per Session: Not on file  Stress: No Stress Concern Present (12/05/2020)   [employer] of Occupational Health - Occupational Stress Questionnaire    Feeling of Stress : Not at all  Social Connections: Socially Integrated (12/05/2020)   Social Connection and Isolation Panel    Frequency of Communication with Friends and Family: Twice a week     Frequency of Social Gatherings with Friends and Family: Twice a week    Attends Religious Services: More than 4 times per year    Active Member of [employer] or Organizations: Yes    Attends Banker Meetings: Never    Marital Status: Married  Catering manager Violence: Not At Risk (09/08/2023)   Humiliation, Afraid, Rape, and Kick questionnaire    Fear of Current or Ex-Partner: No    Emotionally Abused: No    Physically Abused: No    Sexually Abused: No    ALLERGIES: Diltiazem  MEDICATIONS:  Current Outpatient Medications  Medication Sig Dispense Refill   amiodarone  (PACERONE ) 200 MG tablet Take 0.5 tablets (100 mg total) by mouth daily. 45 tablet 3   amLODipine  (NORVASC ) 5 MG tablet TAKE 1 TABLET BY MOUTH DAILY 90 tablet 2   aspirin  EC 81 MG EC tablet Take 1 tablet (81 mg total) by mouth daily.     carvedilol  (COREG ) 25 MG tablet Take 1 tablet (25 mg total) by mouth 2 (two) times daily with a meal. 180 tablet 3   Cholecalciferol  (VITAMIN D3) 50 MCG (2000 UT) TABS Take 2,000 Units by mouth 2 (two) times daily.      Cyanocobalamin  (VITAMIN B-12) 2500 MCG SUBL Take 2,500 mcg by mouth in the morning.     empagliflozin  (JARDIANCE ) 25 MG TABS tablet Take 1 tablet (25 mg total) by mouth daily before breakfast. 90 tablet 3   ezetimibe  (ZETIA ) 10 MG tablet Take 1 tablet (10 mg total) by mouth daily. 90 tablet 3   ferrous sulfate  325 (65 FE) MG tablet Take 325 mg by mouth every Monday, Wednesday, and Friday. In the morning     glucose 4 GM chewable tablet 1 tablet once as needed for low blood sugar.     insulin  isophane & regular human (NOVOLIN 70/30 FLEXPEN) (70-30) 100 UNIT/ML KwikPen Inject 0-30 Units into the skin 2 (two) times daily as needed (high blood sugar).     metFORMIN  (GLUCOPHAGE -XR) 500 MG 24 hr tablet Take 500 mg by mouth 2 (two) times daily.     nitroGLYCERIN  (NITROSTAT ) 0.4 MG SL tablet Place 1 tablet (0.4 mg total) under the tongue every 5 (five) minutes x  3 doses as  needed for chest pain. 25 tablet 3   Omega-3 Fatty Acids (FISH OIL) 1000 MG CAPS Take 2,000 mg by mouth 3 (three) times daily.     OXYGEN  Inhale 2 L/min into the lungs See admin instructions. 2 L/min at bedtime in conjunction with CPAP      PRESCRIPTION MEDICATION Inhale into the lungs See admin instructions. CPAP- At bedtime     rosuvastatin  (CRESTOR ) 40 MG tablet Take 1 tablet (40 mg total) by mouth at bedtime.     sacubitril -valsartan  (ENTRESTO ) 49-51 MG Take 1 tablet by mouth 2 (two) times daily. 60 tablet 0   sertraline  (ZOLOFT ) 50 MG tablet Take 50 mg by mouth at bedtime.     tirzepatide (MOUNJARO) 7.5 MG/0.5ML Pen Inject 7.5 mg into the skin once a week.     No current facility-administered medications for this visit.    REVIEW OF SYSTEMS:  On review of systems, the patient reports that he is doing well overall. He denies any chest pain, shortness of breath, cough, fevers, chills, night sweats, unintended weight changes. He denies any bowel disturbances, and denies abdominal pain, nausea or vomiting. He denies any new musculoskeletal or joint aches or pains. His IPSS was 4, indicating minimal urinary symptoms. His SHIM was 8, indicating he has severe erectile dysfunction. A complete review of systems is obtained and is otherwise negative.     PHYSICAL EXAM:  Wt Readings from Last 3 Encounters:  09/12/23 216 lb (98 kg)  09/08/23 214 lb 6.4 oz (97.3 kg)  08/03/23 212 lb 9.6 oz (96.4 kg)   Temp Readings from Last 3 Encounters:  09/12/23 98.3 F (36.8 C)  09/08/23 98.1 F (36.7 C) (Oral)  05/12/23 (!) 96.7 F (35.9 C) (Temporal)   BP Readings from Last 3 Encounters:  09/12/23 (!) 152/85  09/08/23 (!) 154/86  08/03/23 116/68   Pulse Readings from Last 3 Encounters:  09/12/23 67  09/08/23 84  08/03/23 80    /10  In general this is a well appearing Caucasian male in no acute distress. He's alert and oriented x4 and appropriate throughout the examination. Cardiopulmonary  assessment is negative for acute distress, and he exhibits normal effort.     KPS = 90  100 - Normal; no complaints; no evidence of disease. 90   - Able to carry on normal activity; minor signs or symptoms of disease. 80   - Normal activity with effort; some signs or symptoms of disease. 47   - Cares for self; unable to carry on normal activity or to do active work. 60   - Requires occasional assistance, but is able to care for most of his personal needs. 50   - Requires considerable assistance and frequent medical care. 40   - Disabled; requires special care and assistance. 30   - Severely disabled; hospital admission is indicated although death not imminent. 20   - Very sick; hospital admission necessary; active supportive treatment necessary. 10   - Moribund; fatal processes progressing rapidly. 0     - Dead  Karnofsky DA, Abelmann WH, Craver LS and Burchenal [facility] [phone]) The use of the nitrogen mustards in the palliative treatment of carcinoma: with particular reference to bronchogenic carcinoma Cancer 1 634-56  LABORATORY DATA:  Lab Results  Component Value Date   WBC 7.9 02/28/2023   HGB 14.2 02/28/2023   HCT 43.8 02/28/2023   MCV 94 02/28/2023   PLT 172 02/28/2023   Lab Results  Component Value  Date   NA 142 02/28/2023   K 5.1 02/28/2023   CL 104 02/28/2023   CO2 23 02/28/2023   Lab Results  Component Value Date   ALT 16 11/24/2021   AST 17 11/24/2021   ALKPHOS 133 (H) 11/24/2021   BILITOT 0.8 11/24/2021     RADIOGRAPHY: No results found.    IMPRESSION/PLAN: 1. 68 y.o. 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   The patient has elected to proceed with brachytherapy boost and use of SpaceOAR gel followed by a 5 week course of daily radiotherapy to the prostate and pelvis and simultaneous SBRT to the oligometastatic skeletal disease with Dr.  Lenn at [facility], concurrent with LT-ADT +/- ARPI. for treatment of his disease.  He was started on Orgovyx ADT on 09/17/2023.  We reviewed the risks, benefits, short and long-term effects associated with brachytherapy and discussed the role of SpaceOAR in reducing the rectal toxicity associated with radiotherapy.  He appears to have a good understanding of his disease and our treatment recommendations which are of curative intent.  He was encouraged to ask questions that were answered to his stated satisfaction. He has freely signed written consent to proceed today in the office and a copy of this document will be placed in his medical record. His procedure is tentatively scheduled for 11/24/2023 in collaboration with Dr. Renda and we will see him back for his post-procedure visit approximately 2-3 weeks after his seed implant and he will then proceed with CT [facility] prostate with Dr. Lenn at [facility] for treatment planning in anticipation of beginning the 5 week course of daily external beam radiation and SBRT at that facility, 3-4 weeks after the seed boost procedure.  We look forward to continuing to participate in his care. He knows that he is welcome to call with any questions or concerns at any time in the interim.  I personally spent 30 minutes in this encounter including chart review, reviewing radiological studies, meeting face-to-face with the patient, entering orders and completing documentation.    Sabra MICAEL Rusk, MMS, PA-C [place]  [facility] Radiation Oncology Physician Assistant Direct Dial: [phone]  Fax: [phone]

## 2023-11-03 NOTE — Progress Notes (Signed)
  Radiation Oncology         [id number]) [phone] ________________________________  Name: Edgardo Petrenko MRN: [id number]  Date: 11/03/2023  DOB: September 14, 1955  SIMULATION AND TREATMENT PLANNING NOTE PUBIC ARCH STUDY Seed Implant Boost to be Followed by IMRT  RR:Ropwpr, [facility] Sindy Frederic Fairy, MD  DIAGNOSIS: 68 y.o. 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   Oncology History  Malignant neoplasm of prostate (HCC)  07/18/2023 Cancer Staging   Staging form: Prostate, AJCC 8th Edition - Clinical stage from 07/18/2023: Stage IVB (cT1c, cN1, cM1b, PSA: 7.2, Grade Group: 5) - Signed by Sherwood Rise, PA-C on 09/07/2023 Histopathologic type: Adenocarcinoma, NOS Stage prefix: Initial diagnosis Prostate specific antigen (PSA) range: Less than 10 Gleason primary pattern: 4 Gleason secondary pattern: 5 Gleason score: 9 Histologic grading system: 5 grade system Number of biopsy cores examined: 14 Number of biopsy cores positive: 6 Location of positive needle core biopsies: One side   09/07/2023 Initial Diagnosis   Malignant neoplasm of prostate (HCC)       ICD-10-CM   1. Malignant neoplasm of prostate (HCC)  C61       COMPLEX SIMULATION:  The patient presented today for evaluation for possible prostate seed implant. He was brought to the radiation planning suite and placed supine on the CT couch. A 3-dimensional image study set was obtained in upload to the planning computer. There, on each axial slice, I contoured the prostate gland. Then, using three-dimensional radiation planning tools I reconstructed the prostate in view of the structures from the transperineal needle pathway to assess for possible pubic arch interference. In doing so, I did not appreciate any pubic arch interference. Also, the patient's prostate volume was estimated based on the drawn  structure. The volume was 54 cc.  Given the pubic arch appearance and prostate volume, patient remains a good candidate to proceed with prostate seed implant. Today, he freely provided informed written consent to proceed.    PLAN: The patient will undergo prostate seed implant boost to 110 Gy to be followed by IMRT.   ________________________________  Donnice LABOR. Patrcia, M.D.

## 2023-11-08 NOTE — Progress Notes (Signed)
 COVID Vaccine received:  []  No [x]  Yes Date of any COVID positive Test in last 90 days: none  [facility]  Cardiologist - Gordy Bergamo, MD  EP- Fonda Kitty, MD   Chest x-ray - 11-24-2021 1v EKG -  02-16-2023  Epic Stress Test -  ECHO - 09-12-23  Epic Cardiac Cath - 12-11-2019 [facility] by Dr. Elmira  (has had Multiple caths) CT Coronary Calcium  score:   Bowel Prep - []  No  [x]   Yes _1 Fleet enema  Pacemaker / ICD device []  No [x]  Yes  Medtronic Cobalt XT HF Quad CRT-D (generator chg out 03-08-2023)   Last check 09-09-23   Device orders requested  Spinal Cord Stimulator:[x]  No []  Yes       History of Sleep Apnea? []  No [x]  Yes   CPAP used?- []  No [x]  Yes    Patient has: []  NO Hx DM   []  Pre-DM   []  DM1  [x]   DM2 Does the patient monitor blood sugar?   []  N/A   []  No [x]  Yes  Last A1c was:  8.5  on    11-26-2021  Does patient have a [employer] or Dexcom? []  No [x]  Yes   Fasting Blood Sugar Ranges-  Checks Blood Sugar continuous  times a day  MOUNJARO-  last injection 11-15-2023 METFORMIN -  Hold DOS  Blood Thinner / Instructions: None Aspirin  Instructions:  ASA 81 mg  hold 5-7 days per patient  Dental hx: [x]  Dentures:  Full set  []  N/A      []  Bridge or Partial:                   []  Loose or Damaged teeth:   Activity level: Able to walk up 2 flights of stairs without becoming significantly short of breath or having chest pain?  [x]  No   []    Yes  Patient can perform ADLs without assistance. []  No   [x]   Yes  Anesthesia review: hx NSTEMI- CABG x 2, CHF, hx V.tach, Has ICD, s/p bilateral TCARs in 2022, DM2, Home O2- 2L per [place] and OSA-CPAP.  [id number]   Patient denies any S&S of respiratory illness or Covid - no shortness of breath, fever, cough or chest pain at PAT appointment.  Patient verbalized understanding and agreement to the Pre-Surgical Instructions that were given to them at this PAT appointment. Patient was also educated of the need to review these  PAT instructions again prior to his surgery.I reviewed the appropriate phone numbers to call if they have any and questions or concerns.

## 2023-11-09 ENCOUNTER — Encounter: Payer: Self-pay | Admitting: Cardiology

## 2023-11-09 ENCOUNTER — Encounter (HOSPITAL_COMMUNITY)
Admission: RE | Admit: 2023-11-09 | Discharge: 2023-11-09 | Disposition: A | Discharge status: Home / Self Care | Source: Ambulatory Visit | Attending: Urology | Admitting: Urology

## 2023-11-09 ENCOUNTER — Encounter (HOSPITAL_COMMUNITY): Payer: Self-pay

## 2023-11-09 ENCOUNTER — Other Ambulatory Visit: Payer: Self-pay

## 2023-11-09 VITALS — BP 132/59 | HR 70 | Temp 97.9°F | Resp 16 | Ht 67.0 in | Wt 215.0 lb

## 2023-11-09 DIAGNOSIS — C61 Malignant neoplasm of prostate: Secondary | ICD-10-CM | POA: Diagnosis not present

## 2023-11-09 DIAGNOSIS — I129 Hypertensive chronic kidney disease with stage 1 through stage 4 chronic kidney disease, or unspecified chronic kidney disease: Secondary | ICD-10-CM | POA: Insufficient documentation

## 2023-11-09 DIAGNOSIS — E1122 Type 2 diabetes mellitus with diabetic chronic kidney disease: Secondary | ICD-10-CM | POA: Diagnosis not present

## 2023-11-09 DIAGNOSIS — Z95810 Presence of automatic (implantable) cardiac defibrillator: Secondary | ICD-10-CM | POA: Diagnosis not present

## 2023-11-09 DIAGNOSIS — Z01812 Encounter for preprocedural laboratory examination: Secondary | ICD-10-CM | POA: Insufficient documentation

## 2023-11-09 DIAGNOSIS — G4733 Obstructive sleep apnea (adult) (pediatric): Secondary | ICD-10-CM | POA: Insufficient documentation

## 2023-11-09 DIAGNOSIS — I255 Ischemic cardiomyopathy: Secondary | ICD-10-CM | POA: Diagnosis not present

## 2023-11-09 DIAGNOSIS — I2510 Atherosclerotic heart disease of native coronary artery without angina pectoris: Secondary | ICD-10-CM | POA: Diagnosis not present

## 2023-11-09 DIAGNOSIS — N183 Chronic kidney disease, stage 3 unspecified: Secondary | ICD-10-CM | POA: Insufficient documentation

## 2023-11-09 DIAGNOSIS — Z01818 Encounter for other preprocedural examination: Secondary | ICD-10-CM

## 2023-11-09 DIAGNOSIS — Z9221 Personal history of antineoplastic chemotherapy: Secondary | ICD-10-CM | POA: Insufficient documentation

## 2023-11-09 DIAGNOSIS — Z95820 Peripheral vascular angioplasty status with implants and grafts: Secondary | ICD-10-CM | POA: Insufficient documentation

## 2023-11-09 DIAGNOSIS — Z7985 Long-term (current) use of injectable non-insulin antidiabetic drugs: Secondary | ICD-10-CM | POA: Insufficient documentation

## 2023-11-09 DIAGNOSIS — Z955 Presence of coronary angioplasty implant and graft: Secondary | ICD-10-CM | POA: Diagnosis not present

## 2023-11-09 DIAGNOSIS — Z951 Presence of aortocoronary bypass graft: Secondary | ICD-10-CM | POA: Diagnosis not present

## 2023-11-09 DIAGNOSIS — E119 Type 2 diabetes mellitus without complications: Secondary | ICD-10-CM

## 2023-11-09 DIAGNOSIS — Z7984 Long term (current) use of oral hypoglycemic drugs: Secondary | ICD-10-CM | POA: Diagnosis not present

## 2023-11-09 HISTORY — DX: Cardiac arrhythmia, unspecified: I49.9

## 2023-11-09 HISTORY — DX: Myoneural disorder, unspecified: G70.9

## 2023-11-09 LAB — CBC
HCT: 38.6 % — ABNORMAL LOW (ref 39.0–52.0)
Hemoglobin: 12.2 g/dL — ABNORMAL LOW (ref 13.0–17.0)
MCH: 30.3 pg (ref 26.0–34.0)
MCHC: 31.6 g/dL (ref 30.0–36.0)
MCV: 95.8 fL (ref 80.0–100.0)
Platelets: 164 K/uL (ref 150–400)
RBC: 4.03 MIL/uL — ABNORMAL LOW (ref 4.22–5.81)
RDW: 12.9 % (ref 11.5–15.5)
WBC: 7.4 K/uL (ref 4.0–10.5)
nRBC: 0 % (ref 0.0–0.2)

## 2023-11-09 LAB — COMPREHENSIVE METABOLIC PANEL WITH GFR
ALT: 13 U/L (ref 0–44)
AST: 19 U/L (ref 15–41)
Albumin: 4 g/dL (ref 3.5–5.0)
Alkaline Phosphatase: 137 U/L — ABNORMAL HIGH (ref 38–126)
Anion gap: 11 (ref 5–15)
BUN: 29 mg/dL — ABNORMAL HIGH (ref 8–23)
CO2: 24 mmol/L (ref 22–32)
Calcium: 9.4 mg/dL (ref 8.9–10.3)
Chloride: 103 mmol/L (ref 98–111)
Creatinine, Ser: 1.26 mg/dL — ABNORMAL HIGH (ref 0.61–1.24)
GFR, Estimated: >60 mL/min (ref >60)
Glucose, Bld: 164 mg/dL — ABNORMAL HIGH (ref 70–99)
Potassium: 4.4 mmol/L (ref 3.5–5.1)
Sodium: 139 mmol/L (ref 135–145)
Total Bilirubin: 0.5 mg/dL (ref 0.0–1.2)
Total Protein: 6.8 g/dL (ref 6.5–8.1)

## 2023-11-09 LAB — HEMOGLOBIN A1C
Hgb A1c MFr Bld: 8.7 % — ABNORMAL HIGH (ref 4.8–5.6)
Mean Plasma Glucose: 202.99 mg/dL

## 2023-11-09 LAB — GLUCOSE, CAPILLARY: Glucose-Capillary: 161 mg/dL — ABNORMAL HIGH (ref 70–99)

## 2023-11-09 NOTE — Patient Instructions (Addendum)
 SURGICAL WAITING ROOM VISITATION Patients having surgery or a procedure may have no more than 2 support people in the waiting area - these visitors may rotate in the visitor waiting room.   If the patient needs to stay at the hospital during part of their recovery, the visitor guidelines for inpatient rooms apply.  PRE-OP  VISITATION  Pre-op  nurse will coordinate an appropriate time for 1 support person to accompany the patient in pre-op .  This support person may not rotate.  This visitor will be contacted when the time is appropriate for the visitor to come back in the pre-op  area.  Please refer to the [facility] website for the visitor guidelines for Inpatients (after your surgery is over and you are in a regular room).  You are not required to quarantine at this time prior to your surgery. However, you must do this: Hand Hygiene often Do NOT share personal items Notify your provider if you are in close contact with someone who has COVID or you develop fever 100.4 or greater, new onset of sneezing, cough, sore throat, shortness of breath or body aches.  If you test positive for Covid or have been in contact with anyone that has tested positive in the last 10 days please notify you surgeon.    Your procedure is scheduled on:  THURSDAY  November 24, 2023   Report to [facility] Main Entrance: [address] entrance where the [employer] is available.   Report to admitting at:  10:45   AM  Call this number if you have any questions or problems the morning of surgery [phone]  FOLLOW  ANY ADDITIONAL PRE OP INSTRUCTIONS YOU RECEIVED FROM YOUR SURGEON'S OFFICE!!!  FLEET ENEMA: Obtain one(1) Fleet Enema (sodium phosphate  7-19 gm / 118 ml enema) and use (according to the directions on the box) the morning prior to your surgery.   If you have any questions, please contact your Surgeon's office for additional information.   Do not eat food after Midnight the night prior to your  surgery/procedure.  After Midnight you may have the following liquids until   10:00 AM DAY OF SURGERY  Clear Liquid Diet Water Black Coffee (sugar ok, NO MILK/CREAM OR CREAMERS)  Tea (sugar ok, NO MILK/CREAM OR CREAMERS) regular and decaf                             Plain Jell-O  with no fruit (NO RED)                                           Fruit ices (not with fruit pulp, NO RED)                                     Popsicles (NO RED)                                                                  Juice: NO CITRUS JUICES: only apple, WHITE grape, WHITE cranberry Sports drinks like Gatorade or Powerade (NO RED)  FOLLOW ANY ADDITIONAL PRE OP INSTRUCTIONS YOU RECEIVED FROM YOUR SURGEON'S OFFICE!!!   Oral Hygiene is also important to reduce your risk of infection.        Remember - BRUSH YOUR TEETH THE MORNING OF SURGERY WITH YOUR REGULAR TOOTHPASTE   Do NOT smoke after Midnight the night before surgery.  STOP TAKING all Vitamins, Herbs and supplements 1 week before your surgery.   ASPIRIN  81 mg,  Stop taking 5-7 days before surgery   MOUNJARO- stop injections 7-10 days before your surgery.  Last dose will be injected on:  11-15-23  METFORMIN - Day BEFORE surgery, take as usual.   DO NOT TAKE METFORMIN  THE DAY OF YOUR SURGERY.   Take ONLY these medicines the morning of surgery with A SIP OF WATER: Carvedilol , amiodarone  and Relugolix (Orgovyx).   DO NOT TAKE FUROSEMIDE , Or SACUBITRIL -VALSARTAN  (ENTRESTO ) the morning of surgery.    If You have been diagnosed with Sleep Apnea - Bring CPAP mask and tubing day of surgery. We will provide you with a CPAP machine on the day of your surgery.                   You may not have any metal on your body including , jewelry, and body piercing  Do not wear  lotions, powders, cologne, or deodorant  Men may shave face and neck.  Contacts, Hearing Aids, dentures or bridgework may not be worn into surgery. DENTURES WILL BE  REMOVED PRIOR TO SURGERY PLEASE DO NOT APPLY Poly grip OR ADHESIVES!!!  Patients discharged on the day of surgery will not be allowed to drive home.  Someone NEEDS to stay with you for the first 24 hours after anesthesia.  Do not bring your home medications to the hospital. The Pharmacy will dispense medications listed on your medication list to you during your admission in the Hospital.  Special Instructions: Bring a copy of your healthcare power of attorney and living will documents the day of surgery, if you wish to have them scanned into your [facility] [place] [facility] Records- EPIC  Please read over the following fact sheets you were given: IF YOU HAVE QUESTIONS ABOUT YOUR PRE-OP  INSTRUCTIONS, PLEASE CALL [phone].    [place] - Preparing for Surgery Before surgery, you can play an important role.  Because skin is not sterile, your skin needs to be as free of germs as possible.  You can reduce the number of germs on your skin by washing with Antibacterial soap before surgery.  . Do not shave (including legs and underarms) for at least 48 hours prior to the first shower.  You may shave your face/neck.  Please follow these instructions carefully:  1.  Shower with antibacterial Soap the night before surgery and the  morning of surgery.  2.  If you choose to wash your hair, wash your hair first as usual with your normal  shampoo.  3.  After you shampoo, rinse your hair and body thoroughly to remove the shampoo.                             4.  You can apply soap directly to the skin and wash.  Gently with a scrungie or clean washcloth.  5.  Wash face,  Genitals (private parts) with your normal soap.             6.  Wash thoroughly, paying special attention to the area where your  surgery  will be performed.  7.  Thoroughly rinse your body with warm water from the neck down.  8.   Pat yourself dry with a clean towel.             9  Wear clean pajamas.            10 Place clean sheets on  your bed the night of your first shower and do not  sleep with pets.  ON THE DAY OF SURGERY : Do not apply any lotions/deodorants the morning of surgery.  Please wear clean clothes to the hospital/surgery center.  FAILURE TO FOLLOW THESE INSTRUCTIONS MAY RESULT IN THE CANCELLATION OF YOUR SURGERY  PATIENT SIGNATURE_________________________________  NURSE SIGNATURE__________________________________

## 2023-11-09 NOTE — Progress Notes (Signed)
 PERIOPERATIVE PRESCRIPTION FOR IMPLANTED CARDIAC DEVICE PROGRAMMING  Patient Information: Name:  Martin Bush  DOB:  20-Feb-1955  MRN:  [id number]    Planned Procedure:  Insert Prostate Seeds / Space OAR  Surgeon: Dr. Gretel Ferrara  Date of Procedure: 11-24-23  Cautery will be used.  Position during surgery: High Lithotomy   Medtronic Cobalt XT HF Quad CRT-D (generator chg out 03-08-2023)   Last check 09-09-23   Please send documentation back to:  [facility] (Fax# [phone]) or Respond to the IB message.   Device Information:  Clinic EP Physician:  Dr. Fonda Kitty   Device Type:  Defibrillator Manufacturer and Phone #:  Medtronic: [id number] Pacemaker Dependent?:  No. Date of Last Device Check:  10/18/2023  Normal Device Function?:  Yes.    Electrophysiologist's Recommendations:  Have magnet available. Provide continuous ECG monitoring when magnet is used or reprogramming is to be performed.  Procedure should not interfere with device function.  No device programming or magnet placement needed.  Per Device Clinic Standing Orders, Martin ONEIDA Shutter, RN  8:13 AM 11/09/2023

## 2023-11-10 NOTE — Anesthesia Preprocedure Evaluation (Addendum)
 Anesthesia Evaluation  Patient identified by MRN, date of birth, ID band Patient awake    Reviewed: Allergy & Precautions, H&P , NPO status , Patient's Chart, lab work & pertinent test results, reviewed documented beta blocker date and time   Airway Mallampati: II  TM Distance: >3 FB Neck ROM: Full    Dental no notable dental hx. (+) Edentulous Upper, Partial Lower, Dental Advisory Given   Pulmonary sleep apnea and Continuous Positive Airway Pressure Ventilation , former smoker   Pulmonary exam normal breath sounds clear to auscultation       Cardiovascular hypertension, Pt. on medications and Pt. on home beta blockers + CAD, + Past MI, + Cardiac Stents, + CABG, + Peripheral Vascular Disease and +CHF  + dysrhythmias  Rhythm:Regular Rate:Normal     Neuro/Psych    Depression    negative neurological ROS     GI/Hepatic negative GI ROS, Neg liver ROS,,,  Endo/Other  diabetes, Type 2, Oral Hypoglycemic Agents    Renal/GU Renal InsufficiencyRenal disease  negative genitourinary   Musculoskeletal  (+) Arthritis , Osteoarthritis,    Abdominal   Peds  Hematology negative hematology ROS (+)   Anesthesia Other Findings   Reproductive/Obstetrics negative OB ROS                              Anesthesia Physical Anesthesia Plan  ASA: 4  Anesthesia Plan: General   Post-op Pain Management: Ofirmev  IV (intra-op)*   Induction: Intravenous  PONV Risk Score and Plan: 3 and Ondansetron , Dexamethasone  and Treatment may vary due to age or medical condition  Airway Management Planned: LMA  Additional Equipment:   Intra-op Plan:   Post-operative Plan: Extubation in OR  Informed Consent: I have reviewed the patients History and Physical, chart, labs and discussed the procedure including the risks, benefits and alternatives for the proposed anesthesia with the patient or authorized representative who  has indicated his/her understanding and acceptance.     Dental advisory given  Plan Discussed with: CRNA  Anesthesia Plan Comments: (See PAT note 11/09/2023)         Anesthesia Quick Evaluation

## 2023-11-10 NOTE — Progress Notes (Signed)
 Anesthesia Chart Review   Case: [id number] Date/Time: 11/24/23 1245   Procedures:      INSERTION, RADIATION SOURCE, PROSTATE     INJECTION, HYDROGEL SPACER   Anesthesia type: General   Diagnosis: Prostate cancer (HCC) [C61]   Pre-op  diagnosis: PROSTATE CANCER   Location: [facility] / [facility]   Surgeons: Renda Glance, MD       DISCUSSION:67 y.o. former smoker with h/o OSA on CPAP, HTN, CAD s/p CABG 2002, PCI to left main into superdominant LCx on 10/09/2019, ischemic cardiomyopathy, ICD in place, bilateral carotid artery stenting (left TCAR on 05/26/2020 and a right TCAR on 07/07/2020, DM II, CKD Stage III, prostate cancer scheduled for above procedure 11/24/23 with Dr. Glance Renda.   Device orders in 11/09/2023 progress note, procedure should not interfere. No device programming or magnet placement needed.   Pt cardiology preoperative evaluation 10/05/2023, Chart reviewed as part of pre-operative protocol coverage. Given past medical history and time since last visit, based on ACC/AHA guidelines, Martin Bush is at acceptable risk for the planned procedure without further cardiovascular testing.    Per office protocol, if patient is without any new symptoms or concerns at the time of their virtual visit, he may hold ASA for 5 days prior to procedure. Please resume ASA as soon as possible postprocedure, at the discretion of the surgeon.    Pt advised to hold Mounjaro 1 week prior to procedure.   VS: BP (!) 132/59 Comment: right arm sitting  Pulse 70   Temp 36.6 C (Oral)   Resp 16   Ht 5' 7 (1.702 m)   Wt 97.5 kg   SpO2 98%   BMI 33.67 kg/m   PROVIDERS: Clinic, [facility]  Cardiologist - Gordy Bergamo, MD  LABS: Labs reviewed: Acceptable for surgery. (all labs ordered are listed, but only abnormal results are displayed)  Labs Reviewed  HEMOGLOBIN A1C - Abnormal; Notable for the following components:      Result Value   Hgb A1c MFr Bld 8.7 (*)    All other  components within normal limits  COMPREHENSIVE METABOLIC PANEL WITH GFR - Abnormal; Notable for the following components:   Glucose, Bld 164 (*)    BUN 29 (*)    Creatinine, Ser 1.26 (*)    Alkaline Phosphatase 137 (*)    All other components within normal limits  CBC - Abnormal; Notable for the following components:   RBC 4.03 (*)    Hemoglobin 12.2 (*)    HCT 38.6 (*)    All other components within normal limits  GLUCOSE, CAPILLARY - Abnormal; Notable for the following components:   Glucose-Capillary 161 (*)    All other components within normal limits     IMAGES:   EKG:   CV: Echo 09/12/2023  1. Left ventricular ejection fraction, by estimation, is 35 to 40%. The  left ventricle has moderately decreased function. The left ventricle  demonstrates global hypokinesis. The left ventricular internal cavity size  was mildly dilated. There is mild  concentric left ventricular hypertrophy. Left ventricular diastolic  parameters are indeterminate.   2. Right ventricular systolic function is mildly reduced. The right  ventricular size is moderately enlarged.   3. Left atrial size was mildly dilated.   4. The mitral valve is normal in structure. No evidence of mitral valve  regurgitation. No evidence of mitral stenosis.   5. The aortic valve is tricuspid. Aortic valve regurgitation is trivial.  Aortic valve sclerosis is present, with  no evidence of aortic valve  stenosis.   6. The inferior vena cava is normal in size with greater than 50%  respiratory variability, suggesting right atrial pressure of 3 mmHg.   Past Medical History:  Diagnosis Date   Arthritis    thumbs  (08/02/2017)   CHF (congestive heart failure) (HCC)    Chronic combined systolic and diastolic heart failure (HCC)    a. 08/2017 Echo: EF 20-25%, mod glob HK. Sev distal ant sept, inflat HK. Apical AK. Gr2 DD. Mildly reduced RV fxn.   Chronic kidney disease    III, stage 3   Colon polyps    Coronary artery  disease    Depression    Diabetes mellitus without complication (HCC)    Dysrhythmia    hx of V. tach   Elevated PSA    History of hiatal hernia    Hyperlipidemia    Hypertension    ICD; Biventricular  Medtronic ICD Amplia MRI QWuad CRT-D  in situ 10/29/14 10/29/2014   Ischemic cardiomyopathy 09/27/2018   Neuromuscular disorder (HCC)    Sciatica, right side   NSTEMI (non-ST elevated myocardial infarction) (HCC) 07/16/2014   OSA on CPAP    Peripheral vascular disease    Pneumonia    Prostate cancer (HCC) [facility]/25/2023   [facility]   Proteinuria     Past Surgical History:  Procedure Laterality Date   BIOPSY  09/19/2018   Procedure: BIOPSY;  Surgeon: Eda Iha, MD;  [facility];  Service: Gastroenterology;;   CARDIAC CATHETERIZATION N/A 07/16/2014   Procedure: Left Heart Cath and Coronary Angiography;  Surgeon: Gordy Bergamo, MD;  Location: [facility] INVASIVE CV LAB;  Service: Cardiovascular;  Laterality: N/A;   CARDIAC CATHETERIZATION  07/16/2014   Procedure: Coronary Balloon Angioplasty;  Surgeon: Gordy Bergamo, MD;  Location: [facility] INVASIVE CV LAB;  Service: Cardiovascular;;   CARDIAC CATHETERIZATION  2003   CARDIAC DEFIBRILLATOR PLACEMENT  2016   CATARACT EXTRACTION Left 2018   CATARACT EXTRACTION W/ INTRAOCULAR LENS IMPLANT Left 03/2014   COLONOSCOPY     COLONOSCOPY WITH PROPOFOL  N/A 09/19/2018   Procedure: COLONOSCOPY WITH PROPOFOL ;  Surgeon: Eda Iha, MD;  [facility];  Service: Gastroenterology;  Laterality: N/A;   COLONOSCOPY WITH PROPOFOL  N/A 05/12/2023   Procedure: COLONOSCOPY WITH PROPOFOL ;  Surgeon: Mansouraty, Aloha Raddle., MD;  [facility];  Service: Gastroenterology;  Laterality: N/A;   CORONARY ANGIOPLASTY WITH STENT PLACEMENT     I've got a total of 8 stents in there; mostly doine at [facility] (08/02/2017)   CORONARY ARTERY BYPASS GRAFT  ~ 2003   CABG X2; [facility]   CORONARY ATHERECTOMY N/A 08/16/2017   Procedure: CORONARY  ATHERECTOMY;  Surgeon: Elmira Newman PARAS, MD;  [facility]: [facility] INVASIVE CV LAB;  Service: Cardiovascular;  Laterality: N/A;   CORONARY BALLOON ANGIOPLASTY N/A 08/04/2017   Procedure: CORONARY BALLOON ANGIOPLASTY;  Surgeon: Bergamo Gordy, MD;  [facility]: [facility] INVASIVE CV LAB;  Service: Cardiovascular;  Laterality: N/A;   CORONARY BALLOON ANGIOPLASTY N/A 01/11/2019   Procedure: CORONARY BALLOON ANGIOPLASTY;  Surgeon: Bergamo Gordy, MD;  [facility] CV LAB;  Service: Cardiovascular;  Laterality: N/A;   CORONARY BALLOON ANGIOPLASTY N/A 10/09/2019   Procedure: CORONARY BALLOON ANGIOPLASTY;  Surgeon: Bergamo Gordy, MD;  [facility] LAB;  Service: Cardiovascular;  Laterality: N/A;   CORONARY STENT INTERVENTION N/A 08/16/2017   Procedure: CORONARY STENT INTERVENTION;  Surgeon: Elmira Newman PARAS, MD;  [facility];  Service: Cardiovascular;  Laterality: N/A;  CORONARY STENT INTERVENTION N/A 10/09/2019   Procedure: CORONARY STENT INTERVENTION;  Surgeon: Ladona Heinz, MD;  [facility];  Service: Cardiovascular;  Laterality: N/A;   ELBOW SURGERY Left ?2001   pinched nerve   ENDOSCOPIC MUCOSAL RESECTION  12/18/2018   Procedure: ENDOSCOPIC MUCOSAL RESECTION;  Surgeon: Wilhelmenia Aloha Raddle., MD;  Location: [facility] ENDOSCOPY;  Service: Gastroenterology;;   EYE SURGERY Bilateral    cataract removal   FLEXIBLE SIGMOIDOSCOPY N/A 12/18/2018   Procedure: FLEXIBLE SIGMOIDOSCOPY;  Surgeon: Wilhelmenia Aloha Raddle., MD;  Location: [facility] ENDOSCOPY;  Service: Gastroenterology;  Laterality: N/A;   HEMOSTASIS CLIP PLACEMENT  12/18/2018   Procedure: HEMOSTASIS CLIP PLACEMENT;  Surgeon: Wilhelmenia Aloha Raddle., MD;  Location: [facility] ENDOSCOPY;  Service: Gastroenterology;;   ICD GENERATOR CHANGEOUT N/A 03/08/2023   Procedure: ICD GENERATOR CHANGEOUT;  Surgeon: Kennyth Chew, MD;  Location: [facility] INVASIVE CV LAB;  Service: Cardiovascular;  Laterality: N/A;   LEFT HEART CATH AND CORONARY  ANGIOGRAPHY N/A 01/11/2019   Procedure: LEFT HEART CATH AND CORONARY ANGIOGRAPHY;  Surgeon: Ladona Heinz, MD;  [facility];  Service: Cardiovascular;  Laterality: N/A;   LEFT HEART CATH AND CORS/GRAFTS ANGIOGRAPHY N/A 08/04/2017   Procedure: LEFT HEART CATH AND CORS/GRAFTS ANGIOGRAPHY;  Surgeon: Ladona Heinz, MD;  [facility];  Service: Cardiovascular;  Laterality: N/A;   LEFT HEART CATH AND CORS/GRAFTS ANGIOGRAPHY N/A 08/20/2017   Procedure: LEFT HEART CATH AND CORS/GRAFTS ANGIOGRAPHY;  Surgeon: Elmira Newman PARAS, MD;  [facility];  Service: Cardiovascular;  Laterality: N/A;   LEFT HEART CATH AND CORS/GRAFTS ANGIOGRAPHY N/A 01/08/2019   Procedure: LEFT HEART CATH AND CORS/GRAFTS ANGIOGRAPHY;  Surgeon: Darron Deatrice LABOR, MD;  Location: [facility] INVASIVE CV LAB;  Service: Cardiovascular;  Laterality: N/A;   LEFT HEART CATH AND CORS/GRAFTS ANGIOGRAPHY N/A 10/09/2019   Procedure: LEFT HEART CATH AND CORS/GRAFTS ANGIOGRAPHY;  Surgeon: Ladona Heinz, MD;  [facility];  Service: Cardiovascular;  Laterality: N/A;   LEFT HEART CATH AND CORS/GRAFTS ANGIOGRAPHY N/A 12/11/2019   Procedure: LEFT HEART CATH AND CORS/GRAFTS ANGIOGRAPHY;  Surgeon: Elmira Newman PARAS, MD;  [facility];  Service: Cardiovascular;  Laterality: N/A;   POLYPECTOMY  09/19/2018   Procedure: POLYPECTOMY;  Surgeon: Eda Iha, MD;  [facility];  Service: Gastroenterology;;   POLYPECTOMY  05/12/2023   Procedure: POLYPECTOMY, INTESTINE;  Surgeon: Wilhelmenia Aloha Raddle., MD;  Location: [facility];  Service: Gastroenterology;;   PROSTATE BIOPSY     SUBMUCOSAL INJECTION  09/19/2018   Procedure: SUBMUCOSAL INJECTION;  Surgeon: Eda Iha, MD;  [facility];  Service: Gastroenterology;;   ROBLEY LIFTING INJECTION  12/18/2018   Procedure: SUBMUCOSAL LIFTING INJECTION;  Surgeon: Wilhelmenia Aloha Raddle., MD;  Location: [facility]  ENDOSCOPY;  Service: Gastroenterology;;   TRANSCAROTID ARTERY REVASCULARIZATION  Left 05/26/2020   Procedure: LEFT TRANSCAROTID ARTERY REVASCULARIZATION;  Surgeon: Gretta Lonni PARAS, MD;  Location: [facility] OR;  Service: Vascular;  Laterality: Left;   TRANSCAROTID ARTERY REVASCULARIZATION  Right 07/07/2020   Procedure: RIGHT TRANSCAROTID ARTERY REVASCULARIZATION;  Surgeon: Gretta Lonni PARAS, MD;  Location: [facility] OR;  Service: Vascular;  Laterality: Right;   ULTRASOUND GUIDANCE FOR VASCULAR ACCESS Right 05/26/2020   Procedure: ULTRASOUND GUIDANCE FOR VASCULAR ACCESS;  Surgeon: Gretta Lonni PARAS, MD;  Location: [facility] OR;  Service: Vascular;  Laterality: Right;   ULTRASOUND GUIDANCE FOR VASCULAR ACCESS Left 07/07/2020   Procedure: ULTRASOUND GUIDANCE FOR VASCULAR ACCESS;  Surgeon: Gretta Lonni PARAS, MD;  [facility];  Service: Vascular;  Laterality: Left;    MEDICATIONS:  amiodarone  (PACERONE ) 200 MG tablet   aspirin  EC 81 MG EC tablet   calcium  carbonate (OS-CAL) 1250 (500 Ca) MG chewable tablet   carvedilol  (COREG ) 25 MG tablet   Cholecalciferol  (VITAMIN D3) 50 MCG (2000 UT) TABS   ezetimibe  (ZETIA ) 10 MG tablet   ferrous sulfate  325 (65 FE) MG tablet   furosemide  (LASIX ) 20 MG tablet   metFORMIN  (GLUCOPHAGE -XR) 500 MG 24 hr tablet   nitroGLYCERIN  (NITROSTAT ) 0.4 MG SL tablet   OXYGEN    PRESCRIPTION MEDICATION   relugolix (ORGOVYX) 120 MG tablet   rosuvastatin  (CRESTOR ) 40 MG tablet   sacubitril -valsartan  (ENTRESTO ) 49-51 MG   sertraline  (ZOLOFT ) 50 MG tablet   tirzepatide (MOUNJARO) 7.5 MG/0.5ML Pen   No current facility-administered medications for this encounter.     Harlene Hoots Ward, PA-C [facility] [phone]

## 2023-11-23 ENCOUNTER — Telehealth: Payer: Self-pay | Admitting: *Deleted

## 2023-11-23 NOTE — Telephone Encounter (Signed)
 CALLED PATIENT TO REMIND OF PROCEDURE FOR 11-24-23, LVM FOR A RETURN CALL

## 2023-11-23 NOTE — H&P (Signed)
 Visit Note - November 15, 2023 Zafir, Schauer MRN: [id number] Phone: [phone] DOB: [birth year] Sex: Male PMS ID: [id number] [facility] (Primary Provider) (Bill Under) Page [phone] Work [phone] Fax [facility] [address] [place] janifer [place], [place] [address] Social History Obtained and Reviewed November 15, 2023. EtOH none Smoking status - Former smoker Additional Details: quit smoking 20 years ago Medications Reviewed November 15, 2023. Other: metFORMIN  HCl 500 MG Tablet Amiodarone  HCl 200 MG Tablet amLODIPine  Besylate 5 MG Tablet Aspirin  81 Carvedilol  25 MG Tablet ChlorOxygen Cyanocobalamin  Entresto  49-51 MG Tablet Ferrous Sulfate  Fish Oil Furosemide  20 MG Tablet Glucose 4 GM Tablet Chewable Jardiance  25 MG Tablet Nitroglycerin  0.4 MG Tablet Sublingual NovoLIN 70/30 FlexPen Rosuvastatin  Calcium  40 MG Tablet Semaglutide Sertraline  HCl 50 MG Tablet Vitamin D3 Allergies Obtained and Reviewed November 15, 2023. diltiazem - Rash ROS Provider reviewed on Nov 15, 2023. A focused review of systems was performed including Cardiovascular, Constitutional / Symptom, Gastrointestinal (G.I.), Genitourinary (G.U.), Musculoskeletal, and Respiratory. No Blood In Urine, No Pain Or Burning With Urination, No Urinary Incontinence, No Back Pain, No Chest Pain, No Swelling In Legs, No Fever Or Chills, No Abdominal Pain, No Nausea/vomiting, And No Shortness Of Breath. Medical History Obtained and Reviewed November 15, 2023. Arthritis Diabetes mellitus Elevated blood pressure Malignant tumor of prostate Myocardial infarction Other: Arthritis Depression Diabetes Type 2 Heart disease, unspecified Personal history of other diseases of the circulatory system Sleep Apnea Surgical History CC/HPI: Pt presents today for H/P prior to cysto with brachytherapy and space oar by Dr. Renda on 11/24/23. Received cardiac clearance. Pt  denies F/C, HA, CP, SOB, N/V, diarrhea/constipation, hematuria, dysuria, and flank pain. Historical Summary: CC: Prostate Cancer Physician requesting consult: Dr. Gaile Cerise PCP: Tolbert LIEN Mr. Heckard is a 68 year old gentleman who was found to have an elevated PSA of 6.56 prompting urologic evaluation and TRUS biopsy of the prostate by Dr. Cerise on 07/18/23 that confirmed Gleason 4+5=9 adenocarcinoma of the prostate with 6 out of 14 biopsy cores positive for malignancy (all on the right). Family history: None. Imaging studies: He did have a PSMA PET scan performed last week. The read is currently pending. PMH: He has a history of CHF, hypertension, diabetes, CAD (on NTG), depression. 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 He also has bilateral carotid artery stents for CAS. He is followed by Dr. Gordy Bergamo. His ejection fraction was 25 to 30% on an echocardiogram in 2024. PSH: No abdominal surgeries. TNM stage: cT1c Nx Mx PSA: 6.56 Gleason score: 4+5=9 (GG5) Biopsy (07/18/23 - ready by Dr. Asberry Breen, [place], Acc # [id number]): 6/14 cores positive Left: Benign Right: 6/8 cores positive (Gleason 4+5=9 in 70%) Prostate volume: 64 cc Nomogram OC disease: 19% EPE: 78% SVI: 25% LNI: 23% PFS (5 year, 10 year): 43%, 28% Urinary function: IPSS is 6. Erectile function: SHIM score is 4. Vitals: Date Taken By B.P. Pulse Resp. O2 Sat. Temp. Ht. Wt. BMI BSA 11/15/23 13:32 LEWIS, ASHLEY 148/80 SIT 91 97.9 F 215.0 lbs* 0 0 FiO2 * Patient Reported Exam: Exam Appearance: well developed and nourished, in no acute distress Neck Exam: neck is supple Respiratory Effort: normal respiratory effort without labored breathing Visit Note - November 15, 2023 Alessandro, Griep MRN: [id number] Phone: [phone] DOB: 1955-06-04 Sex: Male PMS ID: [id number] [facility] (Primary Provider) (Bill Under) Page 2 [phone] Work [phone] Fax [facility] [address] [place],  [place] [address] Obtained and Reviewed November 15, 2023. Other: Anesth, Cardioverter/defib CABG (coronary artery bypass grafting) Carotid Endarterectomy, Bilateral Elbow Arthroscopy/surgery, Left Eye Surgery (Unspecified), Bilateral Heart Auscultation Exam: normal heart auscultation Carotids: normal right carotid exam without thrill or aneurysm, normal left carotid exam without thrill or aneurysm  Skin Inspection: normal skin turgor Orientation: alert and oriented to person, place, time Mood: mood and affect well-adjusted, pleasant and cooperative, appropriate for clinical and encounter circumstances Impression/Plan: Prostate Cancer (C61) Plan: Follow-up as scheduled. The patient will follow-up as scheduled. Plan: Additional Notes. There are no changes in the patients history or physical exam since last evaluation by Dr. Renda. Pt is scheduled to undergo cysto with brachytherapy and space oar placement on 11/24/23. All pt's questions were answered to the best of my ability. 1. Staff: ALAN HAMMONDS (Primary Provider) Cherylin Under) ROSINA KERNS Electronically Signed By: ALAN HAMMONDS, 11/15/2023 03:21 PM EDT

## 2023-11-24 ENCOUNTER — Other Ambulatory Visit: Payer: Self-pay

## 2023-11-24 ENCOUNTER — Ambulatory Visit (HOSPITAL_COMMUNITY)

## 2023-11-24 ENCOUNTER — Ambulatory Visit (HOSPITAL_BASED_OUTPATIENT_CLINIC_OR_DEPARTMENT_OTHER): Admitting: Anesthesiology

## 2023-11-24 ENCOUNTER — Encounter (HOSPITAL_COMMUNITY)
Admission: RE | Disposition: A | Discharge status: Home / Self Care | Payer: Self-pay | Source: Ambulatory Visit | Attending: Urology

## 2023-11-24 ENCOUNTER — Encounter (HOSPITAL_COMMUNITY): Payer: Self-pay | Admitting: Urology

## 2023-11-24 ENCOUNTER — Ambulatory Visit (HOSPITAL_COMMUNITY): Payer: Self-pay | Admitting: Physician Assistant

## 2023-11-24 ENCOUNTER — Ambulatory Visit (HOSPITAL_COMMUNITY)
Admission: RE | Admit: 2023-11-24 | Discharge: 2023-11-24 | Disposition: A | Discharge status: Home / Self Care | Source: Ambulatory Visit | Attending: Urology | Admitting: Urology

## 2023-11-24 DIAGNOSIS — I110 Hypertensive heart disease with heart failure: Secondary | ICD-10-CM | POA: Insufficient documentation

## 2023-11-24 DIAGNOSIS — I509 Heart failure, unspecified: Secondary | ICD-10-CM | POA: Diagnosis not present

## 2023-11-24 DIAGNOSIS — Z955 Presence of coronary angioplasty implant and graft: Secondary | ICD-10-CM | POA: Insufficient documentation

## 2023-11-24 DIAGNOSIS — Z87891 Personal history of nicotine dependence: Secondary | ICD-10-CM | POA: Diagnosis not present

## 2023-11-24 DIAGNOSIS — I2510 Atherosclerotic heart disease of native coronary artery without angina pectoris: Secondary | ICD-10-CM

## 2023-11-24 DIAGNOSIS — Z7984 Long term (current) use of oral hypoglycemic drugs: Secondary | ICD-10-CM | POA: Diagnosis not present

## 2023-11-24 DIAGNOSIS — I252 Old myocardial infarction: Secondary | ICD-10-CM | POA: Insufficient documentation

## 2023-11-24 DIAGNOSIS — Z951 Presence of aortocoronary bypass graft: Secondary | ICD-10-CM | POA: Diagnosis not present

## 2023-11-24 DIAGNOSIS — E119 Type 2 diabetes mellitus without complications: Secondary | ICD-10-CM | POA: Diagnosis not present

## 2023-11-24 DIAGNOSIS — G4730 Sleep apnea, unspecified: Secondary | ICD-10-CM | POA: Diagnosis not present

## 2023-11-24 DIAGNOSIS — C61 Malignant neoplasm of prostate: Secondary | ICD-10-CM | POA: Insufficient documentation

## 2023-11-24 DIAGNOSIS — Z01818 Encounter for other preprocedural examination: Secondary | ICD-10-CM

## 2023-11-24 HISTORY — PX: RADIOACTIVE SEED IMPLANT: SHX5150

## 2023-11-24 HISTORY — PX: SPACE OAR INSTILLATION: SHX6769

## 2023-11-24 LAB — GLUCOSE, CAPILLARY
Glucose-Capillary: 188 mg/dL — ABNORMAL HIGH (ref 70–99)
Glucose-Capillary: 128 mg/dL — ABNORMAL HIGH (ref 70–99)
Glucose-Capillary: 190 mg/dL — ABNORMAL HIGH (ref 70–99)
Glucose-Capillary: 128 mg/dL — ABNORMAL HIGH (ref 70–99)

## 2023-11-24 SURGERY — INSERTION, RADIATION SOURCE, PROSTATE
Anesthesia: General

## 2023-11-24 MED ORDER — CHLORHEXIDINE GLUCONATE 0.12 % MT SOLN
15.0000 mL | Freq: Once | OROMUCOSAL | Status: AC
  Administered 2023-11-24: 15 mL via OROMUCOSAL

## 2023-11-24 MED ORDER — SUGAMMADEX SODIUM 200 MG/2ML IV SOLN
INTRAVENOUS | Status: AC
  Filled 2023-11-24: qty 2

## 2023-11-24 MED ORDER — TRAMADOL HCL 50 MG PO TABS: 50.0000 mg | ORAL_TABLET | Freq: Four times a day (QID) | ORAL | 0 refills | Status: AC | PRN

## 2023-11-24 MED ORDER — SODIUM CHLORIDE (PF) 0.9 % IJ SOLN
INTRAMUSCULAR | Status: DC | PRN
  Administered 2023-11-24: 10 mL

## 2023-11-24 MED ORDER — PHENYLEPHRINE 80 MCG/ML (10ML) SYRINGE FOR IV PUSH (FOR BLOOD PRESSURE SUPPORT)
PREFILLED_SYRINGE | INTRAVENOUS | Status: AC
  Filled 2023-11-24: qty 10

## 2023-11-24 MED ORDER — LIDOCAINE HCL (PF) 2 % IJ SOLN
INTRAMUSCULAR | Status: DC | PRN
  Administered 2023-11-24: 100 mg via INTRADERMAL

## 2023-11-24 MED ORDER — ORAL CARE MOUTH RINSE: 15.0000 mL | Freq: Once | OROMUCOSAL | Status: AC

## 2023-11-24 MED ORDER — ONDANSETRON HCL 4 MG/2ML IJ SOLN
INTRAMUSCULAR | Status: DC | PRN
  Administered 2023-11-24: 4 mg via INTRAVENOUS

## 2023-11-24 MED ORDER — TAMSULOSIN HCL 0.4 MG PO CAPS: 0.4000 mg | ORAL_CAPSULE | Freq: Every day | ORAL | 0 refills | Status: AC

## 2023-11-24 MED ORDER — FENTANYL CITRATE (PF) 100 MCG/2ML IJ SOLN
INTRAMUSCULAR | Status: AC
  Filled 2023-11-24: qty 2

## 2023-11-24 MED ORDER — EPHEDRINE 5 MG/ML INJ
INTRAVENOUS | Status: AC
  Filled 2023-11-24: qty 5

## 2023-11-24 MED ORDER — SODIUM CHLORIDE (PF) 0.9 % IJ SOLN
INTRAMUSCULAR | Status: AC
  Filled 2023-11-24: qty 10

## 2023-11-24 MED ORDER — STERILE WATER FOR IRRIGATION IR SOLN
Status: DC | PRN
  Administered 2023-11-24: 500 mL

## 2023-11-24 MED ORDER — IOHEXOL 300 MG/ML  SOLN
INTRAMUSCULAR | Status: DC | PRN
  Administered 2023-11-24: 5 mL

## 2023-11-24 MED ORDER — MIDAZOLAM HCL 2 MG/2ML IJ SOLN
INTRAMUSCULAR | Status: AC
  Filled 2023-11-24: qty 2

## 2023-11-24 MED ORDER — SUGAMMADEX SODIUM 200 MG/2ML IV SOLN
INTRAVENOUS | Status: DC | PRN
  Administered 2023-11-24: 100 mg via INTRAVENOUS
  Administered 2023-11-24: 200 mg via INTRAVENOUS

## 2023-11-24 MED ORDER — ROCURONIUM BROMIDE 10 MG/ML (PF) SYRINGE
PREFILLED_SYRINGE | INTRAVENOUS | Status: DC | PRN
  Administered 2023-11-24: 10 mg via INTRAVENOUS
  Administered 2023-11-24: 40 mg via INTRAVENOUS
  Administered 2023-11-24: 10 mg via INTRAVENOUS

## 2023-11-24 MED ORDER — FENTANYL CITRATE (PF) 100 MCG/2ML IJ SOLN
INTRAMUSCULAR | Status: DC | PRN
  Administered 2023-11-24: 50 ug via INTRAVENOUS

## 2023-11-24 MED ORDER — DEXAMETHASONE SOD PHOSPHATE PF 10 MG/ML IJ SOLN
INTRAMUSCULAR | Status: DC | PRN
  Administered 2023-11-24: 5 mg via INTRAVENOUS

## 2023-11-24 MED ORDER — INSULIN ASPART 100 UNIT/ML IJ SOLN
0.0000 [IU] | INTRAMUSCULAR | Status: AC | PRN
  Administered 2023-11-24 (×2): 4 [IU] via SUBCUTANEOUS
  Filled 2023-11-24: qty 1

## 2023-11-24 MED ORDER — FLEET ENEMA RE ENEM
1.0000 | ENEMA | Freq: Once | RECTAL | Status: DC
  Filled 2023-11-24: qty 1

## 2023-11-24 MED ORDER — PHENYLEPHRINE 80 MCG/ML (10ML) SYRINGE FOR IV PUSH (FOR BLOOD PRESSURE SUPPORT)
PREFILLED_SYRINGE | INTRAVENOUS | Status: DC | PRN
  Administered 2023-11-24: 160 ug via INTRAVENOUS
  Administered 2023-11-24 (×2): 80 ug via INTRAVENOUS
  Administered 2023-11-24 (×3): 160 ug via INTRAVENOUS

## 2023-11-24 MED ORDER — PROPOFOL 10 MG/ML IV BOLUS
INTRAVENOUS | Status: DC | PRN
  Administered 2023-11-24: 70 mg via INTRAVENOUS

## 2023-11-24 MED ORDER — LACTATED RINGERS IV SOLN: INTRAVENOUS | Status: DC

## 2023-11-24 MED ORDER — CIPROFLOXACIN IN D5W 400 MG/200ML IV SOLN
400.0000 mg | INTRAVENOUS | Status: AC
  Administered 2023-11-24: 400 mg via INTRAVENOUS
  Filled 2023-11-24: qty 200

## 2023-11-24 MED ORDER — EPHEDRINE SULFATE (PRESSORS) 25 MG/5ML IV SOSY
PREFILLED_SYRINGE | INTRAVENOUS | Status: DC | PRN
  Administered 2023-11-24: 15 mg via INTRAVENOUS

## 2023-11-24 SURGICAL SUPPLY — 30 items
BAG COUNTER SPONGE SURGICOUNT (BAG) IMPLANT
BAG URINE DRAIN 2000ML AR STRL (UROLOGICAL SUPPLIES) IMPLANT
BARD QuickLink Cartridges with BrachySource 1-125 IMPLANT
CATH ROBINSON RED A/P 20FR (CATHETERS) ×1 IMPLANT
COVER BACK TABLE 60X90IN (DRAPES) ×1 IMPLANT
COVER MAYO STAND STRL (DRAPES) ×1 IMPLANT
DRAPE SURG IRRIG POUCH 19X23 (DRAPES) ×1 IMPLANT
DRAPE U-SHAPE 47X51 STRL (DRAPES) ×1 IMPLANT
DRSG TEGADERM 4X4.75 (GAUZE/BANDAGES/DRESSINGS) ×1 IMPLANT
DRSG TEGADERM 8X12 (GAUZE/BANDAGES/DRESSINGS) ×1 IMPLANT
GLOVE BIO SURGEON STRL SZ7.5 (GLOVE) ×1 IMPLANT
GLOVE SURG LX STRL 7.5 STRW (GLOVE) ×1 IMPLANT
GOWN STRL REUS W/ TWL XL LVL3 (GOWN DISPOSABLE) ×1 IMPLANT
GRID BRACH TEMP 18GA 2.8X3X.75 (MISCELLANEOUS) ×1 IMPLANT
HOLDER FOLEY CATH W/STRAP (MISCELLANEOUS) ×1 IMPLANT
IMPL SPACEOAR SYSTEM 10ML (Spacer) ×1 IMPLANT
KIT TURNOVER KIT A (KITS) ×1 IMPLANT
MARKER SKIN DUAL TIP RULER LAB (MISCELLANEOUS) ×1 IMPLANT
NDL BRACHY 18G 5PK (NEEDLE) ×3 IMPLANT
NDL BRACHYTHERAPY 18GX20 (NEEDLE) IMPLANT
NDL PK MORGANSTERN STABILIZ (NEEDLE) ×1 IMPLANT
NEEDLE BRACHY 18G 5PK (NEEDLE) ×3 IMPLANT
NEEDLE BRACHYTHERAPY 18GX20 (NEEDLE) ×1 IMPLANT
NEEDLE PK MORGANSTERN STABILIZ (NEEDLE) ×1 IMPLANT
PACK CYSTO (CUSTOM PROCEDURE TRAY) ×1 IMPLANT
SURGILUBE 2OZ TUBE FLIPTOP (MISCELLANEOUS) ×1 IMPLANT
SYR 10ML LL (SYRINGE) IMPLANT
TOWEL OR 17X26 10 PK STRL BLUE (TOWEL DISPOSABLE) ×1 IMPLANT
TRAY FOLEY MTR SLVR 16FR STAT (SET/KITS/TRAYS/PACK) ×1 IMPLANT
UNDERPAD 30X36 HEAVY ABSORB (UNDERPADS AND DIAPERS) ×1 IMPLANT

## 2023-11-24 NOTE — Interval H&P Note (Signed)
 History and Physical Interval Note:  11/24/2023 9:57 AM  Martin Bush  has presented today for surgery, with the diagnosis of PROSTATE CANCER.  The various methods of treatment have been discussed with the patient and family. After consideration of risks, benefits and other options for treatment, the patient has consented to  Procedure(s): INSERTION, RADIATION SOURCE, PROSTATE (N/A) INJECTION, HYDROGEL SPACER (N/A) as a surgical intervention.  The patient's history has been reviewed, patient examined, no change in status, stable for surgery.  I have reviewed the patient's chart and labs.  Questions were answered to the patient's satisfaction.     [employer]

## 2023-11-24 NOTE — Transfer of Care (Signed)
 Immediate Anesthesia Transfer of Care Note  Patient: Martin Bush  Procedure(s) Performed: INSERTION, RADIATION SOURCE, PROSTATE INJECTION, HYDROGEL SPACER  Patient Location: PACU  Anesthesia Type:General  Level of Consciousness: sedated  Airway & Oxygen  Therapy: Patient Spontanous Breathing and Patient connected to face mask oxygen   Post-op Assessment: Report given to RN and Post -op Vital signs reviewed and stable  Post vital signs: Reviewed and stable  Last Vitals:  Vitals Value Taken Time  BP    Temp    Pulse 81 11/24/23 14:29  Resp 16 11/24/23 14:29  SpO2 100 % 11/24/23 14:29  Vitals shown include unfiled device data.  Last Pain:  Vitals:   11/24/23 1009  TempSrc: Oral  PainSc:          Complications: No notable events documented.

## 2023-11-24 NOTE — Op Note (Signed)
 Preoperative diagnosis: Clinically localized adenocarcinoma of the prostate (T1c N1 M1b)  Postoperative diagnosis: Clinically localized adenocarcinoma of the prostate  Procedure: 1) Transperineal placement of radioactive seeds into the prostate                    2) Cystoscopy                    3) Insertion of SpaceOAR hydrogel   Surgeon: Gretel CANDIE Renda Mickey. M.D.  Radiation oncologist: Donnice Barge, M.D.  Anesthesia: General  EBL: Minimal  Complications: None  Indication: Martin Bush is a 68 y.o. gentleman with oligometastatic prostate cancer. After discussing management options for treatment, he elected to proceed with radiotherapy. He presents today for the above procedures. The potential risks, complications, alternative options, and expected recovery course have been discussed in detail with the patient and he has provided informed consent to proceed.  Description of procedure: The patient was taken to the operating room and general anesthesia was induced. He was administered preoperative antibiotics, placed in the dorsal lithotomy position, and prepped and draped in the usual sterile fashion. Next, intraoperative transrectal ultrasonography was utilized for real-time intraoperative planning by the radiation oncology team. Once the treatment plan was completed and the seed strands created, stranded iodine  125 radiation seeds were placed utilizing a brachytherapy perineal template. 58 radioactive iodine  125 seeds into the prostate through 16 catheter needles.  The brachytherapy template was then removed.  A site in the midline was selected on the perineum for placement of an 18 g needle with saline.  The needle was advanced above the rectum and below Denonvillier's fascia to the mid gland and confirmed to be in the midline on transverse imaging.  One cc of saline was injected confirming appropriate expansion of this space.  A total of 5 cc of saline was then injected to open the  space further bilaterally.  The saline syringe was then removed and the SpaceOAR hydrogel was injected with good distribution bilaterally. Position of the radiation seeds was confirmed on fluoroscopic imaging.  Flexible cystoscopy was then performed and no seeds were identified within the bladder.  No bladder tumors, stones, or other mucosal pathology was identified within the bladder. He tolerated the procedure well and without complications. He was able to be transferred to the recovery unit in satisfactory condition.  He was given a voiding trial in the PACU.

## 2023-11-24 NOTE — Anesthesia Postprocedure Evaluation (Signed)
 Anesthesia Post Note  Patient: Tareq Dwan  Procedure(s) Performed: INSERTION, RADIATION SOURCE, PROSTATE INJECTION, HYDROGEL SPACER     Patient location during evaluation: PACU Anesthesia Type: General Level of consciousness: awake and alert Pain management: pain level controlled Vital Signs Assessment: post-procedure vital signs reviewed and stable Respiratory status: spontaneous breathing, nonlabored ventilation, respiratory function stable and patient connected to nasal cannula oxygen  Cardiovascular status: blood pressure returned to baseline and stable Postop Assessment: no apparent nausea or vomiting Anesthetic complications: no   No notable events documented.  Last Vitals:  Vitals:   11/24/23 1445 11/24/23 1500  BP: (!) 162/90 (!) 163/66  Pulse: 78 (!) 56  Resp: 19 14  Temp:    SpO2: (!) 89% 92%    Last Pain:  Vitals:   11/24/23 1500  TempSrc:   PainSc: 0-No pain                 Areebah Meinders,W. EDMOND

## 2023-11-24 NOTE — Discharge Instructions (Addendum)
You will be prescribed tamsulosin which is a medication to help you urinate over the next month.  You should call Dr. Vevelyn Royals office [phone]) if you feel you cannot empty your bladder well. Also, call if you develop fever > 101.  You will also be prescribed pain medication and should take an over the counter stool softener over the next week to avoid straining with bowel movements.  Followup with Dr. Laverle Patter and your radiation oncologist as scheduled.

## 2023-11-24 NOTE — Anesthesia Procedure Notes (Addendum)
 Procedure Name: Intubation Date/Time: 11/24/2023 1:04 PM  Performed by: Carleton Garnette SAUNDERS, CRNAPre-anesthesia Checklist: Patient identified, Emergency Drugs available, Suction available and Patient being monitored Patient Re-evaluated:Patient Re-evaluated prior to induction Oxygen  Delivery Method: Circle system utilized Preoxygenation: Pre-oxygenation with 100% oxygen  Induction Type: IV induction Ventilation: Mask ventilation without difficulty Laryngoscope Size: Mac and 4 Grade View: Grade I Tube type: Oral Tube size: 7.5 mm Number of attempts: 1 Airway Equipment and Method: Stylet Placement Confirmation: ETT inserted through vocal cords under direct vision, positive ETCO2 and breath sounds checked- equal and bilateral Secured at: 22 cm Tube secured with: Tape Dental Injury: Teeth and Oropharynx as per pre-operative assessment

## 2023-11-25 ENCOUNTER — Encounter (HOSPITAL_COMMUNITY): Payer: Self-pay | Admitting: Urology

## 2023-11-25 NOTE — Progress Notes (Signed)
 Radiation Oncology         [phone] ________________________________  Name: Martin Bush MRN: [id number]  Date: 11/25/2023  DOB: Jun 29, 1955       Prostate Seed Implant  RR:[facility], [place] [facility]  No ref. provider found  DIAGNOSIS:  68 y.o. 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   Oncology History  Malignant neoplasm of prostate (HCC)  07/18/2023 Cancer Staging   Staging form: Prostate, AJCC 8th Edition - Clinical stage from 07/18/2023: Stage IVB (cT1c, cN1, cM1b, PSA: 7.2, Grade Group: 5) - Signed by Sherwood Rise, PA-C on 09/07/2023 Histopathologic type: Adenocarcinoma, NOS Stage prefix: Initial diagnosis Prostate specific antigen (PSA) range: Less than 10 Gleason primary pattern: 4 Gleason secondary pattern: 5 Gleason score: 9 Histologic grading system: 5 grade system Number of biopsy cores examined: 14 Number of biopsy cores positive: 6 Location of positive needle core biopsies: One side   09/07/2023 Initial Diagnosis   Malignant neoplasm of prostate (HCC)       ICD-10-CM   1. Pre-op  testing  Z01.818 CANCELED: CBG per Guidelines for Diabetes Management for Patients Undergoing Surgery ([facility], [facility], and [facility] only)    CANCELED: CBG per Guidelines for Diabetes Management for Patients Undergoing Surgery ([facility], [facility], and [facility] only)    2. Cardiovascular disease  I25.10 CANCELED: CBG per Guidelines for Diabetes Management for Patients Undergoing Surgery ([facility], [facility], and [facility] only)    CANCELED: CBG per Guidelines for Diabetes Management for Patients Undergoing Surgery ([facility], [facility], and [facility] only)    3. Type 2 diabetes mellitus treated without insulin  (HCC)  E11.9 CANCELED: CBG per Guidelines for Diabetes Management for Patients Undergoing Surgery ([facility], [facility], and [facility] only)    CANCELED: CBG per Guidelines for Diabetes Management for Patients Undergoing Surgery ([facility], [facility], and [facility]  only)      PROCEDURE: Insertion of radioactive I-125 seeds into the prostate gland.  RADIATION DOSE: 110 Gy, boost therapy.  TECHNIQUE: Martin Bush was brought to the operating room with the urologist. He was placed in the dorsolithotomy position. He was catheterized and a rectal tube was inserted. The perineum was shaved, prepped and draped. The ultrasound probe was then introduced by me into the rectum to see the prostate gland.  TREATMENT DEVICE: I attached the needle grid to the ultrasound probe stand and anchor needles were placed.  3D PLANNING: The prostate was imaged in 3D using a sagittal sweep of the prostate probe. These images were transferred to the planning computer. There, the prostate, urethra and rectum were defined on each axial reconstructed image. Then, the software created an optimized 3D plan and a few seed positions were adjusted. The quality of the plan was reviewed using [facility] information for the target and the following two organs at risk:  Urethra and Rectum.  Then the accepted plan was printed and handed off to the radiation therapist.  Under my supervision, the custom loading of the seeds and spacers was carried out using the quick loader.  These pre-loaded needles were then placed into the needle holder.SABRA  PROSTATE VOLUME STUDY:  Using transrectal ultrasound the volume of the prostate was verified to be 46.7 cc.  SPECIAL TREATMENT PROCEDURE/SUPERVISION AND HANDLING: The pre-loaded needles were then delivered by the urologist under sagittal guidance. A total of 16 needles were used to deposit 58 seeds in the prostate gland. The individual seed activity was 0.432  mCi.  SpaceOAR:  Yes  COMPLEX SIMULATION: At the end of the procedure, an anterior radiograph of the pelvis was obtained to document seed positioning and count. Cystoscopy was performed by the urologist to check the urethra and bladder.  MICRODOSIMETRY: At the end of the procedure, the patient was  emitting 0.04 mR/hr at 1 meter. Accordingly, he was considered safe for hospital discharge.  PLAN: The patient will return to the radiation oncology clinic for post implant CT dosimetry in three weeks.   ________________________________  Martin Bush Martin Bush, M.D.

## 2023-11-30 ENCOUNTER — Encounter: Payer: Self-pay | Admitting: Urology

## 2023-11-30 ENCOUNTER — Other Ambulatory Visit: Payer: Self-pay | Admitting: Urology

## 2023-11-30 DIAGNOSIS — C61 Malignant neoplasm of prostate: Secondary | ICD-10-CM

## 2023-12-05 ENCOUNTER — Inpatient Hospital Stay (HOSPITAL_COMMUNITY)
Admission: EM | Admit: 2023-12-05 | Discharge: 2023-12-08 | DRG: 291 | Disposition: A | Discharge status: Home / Self Care | Attending: Internal Medicine | Admitting: Internal Medicine

## 2023-12-05 ENCOUNTER — Encounter (HOSPITAL_COMMUNITY): Payer: Self-pay

## 2023-12-05 ENCOUNTER — Other Ambulatory Visit: Payer: Self-pay

## 2023-12-05 ENCOUNTER — Emergency Department (HOSPITAL_COMMUNITY)

## 2023-12-05 DIAGNOSIS — Z8049 Family history of malignant neoplasm of other genital organs: Secondary | ICD-10-CM

## 2023-12-05 DIAGNOSIS — E7849 Other hyperlipidemia: Secondary | ICD-10-CM | POA: Diagnosis present

## 2023-12-05 DIAGNOSIS — I5023 Acute on chronic systolic (congestive) heart failure: Secondary | ICD-10-CM | POA: Diagnosis present

## 2023-12-05 DIAGNOSIS — F39 Unspecified mood [affective] disorder: Secondary | ICD-10-CM | POA: Diagnosis present

## 2023-12-05 DIAGNOSIS — E1122 Type 2 diabetes mellitus with diabetic chronic kidney disease: Secondary | ICD-10-CM | POA: Diagnosis present

## 2023-12-05 DIAGNOSIS — D631 Anemia in chronic kidney disease: Secondary | ICD-10-CM | POA: Diagnosis present

## 2023-12-05 DIAGNOSIS — N400 Enlarged prostate without lower urinary tract symptoms: Secondary | ICD-10-CM | POA: Diagnosis present

## 2023-12-05 DIAGNOSIS — E1151 Type 2 diabetes mellitus with diabetic peripheral angiopathy without gangrene: Secondary | ICD-10-CM | POA: Diagnosis present

## 2023-12-05 DIAGNOSIS — Z8546 Personal history of malignant neoplasm of prostate: Secondary | ICD-10-CM

## 2023-12-05 DIAGNOSIS — E66811 Obesity, class 1: Secondary | ICD-10-CM | POA: Diagnosis present

## 2023-12-05 DIAGNOSIS — Z794 Long term (current) use of insulin: Secondary | ICD-10-CM | POA: Diagnosis not present

## 2023-12-05 DIAGNOSIS — E1169 Type 2 diabetes mellitus with other specified complication: Secondary | ICD-10-CM | POA: Diagnosis present

## 2023-12-05 DIAGNOSIS — Z833 Family history of diabetes mellitus: Secondary | ICD-10-CM

## 2023-12-05 DIAGNOSIS — I2510 Atherosclerotic heart disease of native coronary artery without angina pectoris: Secondary | ICD-10-CM | POA: Diagnosis present

## 2023-12-05 DIAGNOSIS — Z7982 Long term (current) use of aspirin: Secondary | ICD-10-CM | POA: Diagnosis not present

## 2023-12-05 DIAGNOSIS — Z9981 Dependence on supplemental oxygen: Secondary | ICD-10-CM | POA: Diagnosis not present

## 2023-12-05 DIAGNOSIS — C61 Malignant neoplasm of prostate: Secondary | ICD-10-CM | POA: Diagnosis present

## 2023-12-05 DIAGNOSIS — Z87891 Personal history of nicotine dependence: Secondary | ICD-10-CM

## 2023-12-05 DIAGNOSIS — I252 Old myocardial infarction: Secondary | ICD-10-CM

## 2023-12-05 DIAGNOSIS — N1831 Chronic kidney disease, stage 3a: Secondary | ICD-10-CM | POA: Diagnosis present

## 2023-12-05 DIAGNOSIS — Z923 Personal history of irradiation: Secondary | ICD-10-CM

## 2023-12-05 DIAGNOSIS — I130 Hypertensive heart and chronic kidney disease with heart failure and stage 1 through stage 4 chronic kidney disease, or unspecified chronic kidney disease: Principal | ICD-10-CM | POA: Diagnosis present

## 2023-12-05 DIAGNOSIS — M19041 Primary osteoarthritis, right hand: Secondary | ICD-10-CM | POA: Diagnosis present

## 2023-12-05 DIAGNOSIS — N179 Acute kidney failure, unspecified: Secondary | ICD-10-CM | POA: Diagnosis not present

## 2023-12-05 DIAGNOSIS — R7989 Other specified abnormal findings of blood chemistry: Secondary | ICD-10-CM | POA: Diagnosis present

## 2023-12-05 DIAGNOSIS — Z6833 Body mass index (BMI) 33.0-33.9, adult: Secondary | ICD-10-CM | POA: Diagnosis not present

## 2023-12-05 DIAGNOSIS — I25810 Atherosclerosis of coronary artery bypass graft(s) without angina pectoris: Secondary | ICD-10-CM | POA: Diagnosis present

## 2023-12-05 DIAGNOSIS — Z95810 Presence of automatic (implantable) cardiac defibrillator: Secondary | ICD-10-CM

## 2023-12-05 DIAGNOSIS — I255 Ischemic cardiomyopathy: Secondary | ICD-10-CM | POA: Diagnosis present

## 2023-12-05 DIAGNOSIS — Z7984 Long term (current) use of oral hypoglycemic drugs: Secondary | ICD-10-CM

## 2023-12-05 DIAGNOSIS — Z8679 Personal history of other diseases of the circulatory system: Secondary | ICD-10-CM

## 2023-12-05 DIAGNOSIS — N1832 Chronic kidney disease, stage 3b: Secondary | ICD-10-CM | POA: Diagnosis present

## 2023-12-05 DIAGNOSIS — R0602 Shortness of breath: Secondary | ICD-10-CM | POA: Diagnosis present

## 2023-12-05 DIAGNOSIS — I472 Ventricular tachycardia, unspecified: Secondary | ICD-10-CM | POA: Diagnosis present

## 2023-12-05 DIAGNOSIS — G4733 Obstructive sleep apnea (adult) (pediatric): Secondary | ICD-10-CM | POA: Diagnosis present

## 2023-12-05 DIAGNOSIS — I509 Heart failure, unspecified: Secondary | ICD-10-CM | POA: Insufficient documentation

## 2023-12-05 DIAGNOSIS — Z801 Family history of malignant neoplasm of trachea, bronchus and lung: Secondary | ICD-10-CM

## 2023-12-05 DIAGNOSIS — E785 Hyperlipidemia, unspecified: Secondary | ICD-10-CM | POA: Diagnosis present

## 2023-12-05 DIAGNOSIS — M19042 Primary osteoarthritis, left hand: Secondary | ICD-10-CM | POA: Diagnosis present

## 2023-12-05 DIAGNOSIS — Z9842 Cataract extraction status, left eye: Secondary | ICD-10-CM

## 2023-12-05 DIAGNOSIS — Z961 Presence of intraocular lens: Secondary | ICD-10-CM | POA: Diagnosis present

## 2023-12-05 DIAGNOSIS — I5021 Acute systolic (congestive) heart failure: Principal | ICD-10-CM | POA: Diagnosis present

## 2023-12-05 DIAGNOSIS — I10 Essential (primary) hypertension: Secondary | ICD-10-CM | POA: Diagnosis present

## 2023-12-05 DIAGNOSIS — I6529 Occlusion and stenosis of unspecified carotid artery: Secondary | ICD-10-CM | POA: Diagnosis present

## 2023-12-05 DIAGNOSIS — Z955 Presence of coronary angioplasty implant and graft: Secondary | ICD-10-CM

## 2023-12-05 DIAGNOSIS — Z860100 Personal history of colon polyps, unspecified: Secondary | ICD-10-CM

## 2023-12-05 DIAGNOSIS — Z8349 Family history of other endocrine, nutritional and metabolic diseases: Secondary | ICD-10-CM

## 2023-12-05 DIAGNOSIS — Z79899 Other long term (current) drug therapy: Secondary | ICD-10-CM

## 2023-12-05 DIAGNOSIS — Z7985 Long-term (current) use of injectable non-insulin antidiabetic drugs: Secondary | ICD-10-CM

## 2023-12-05 DIAGNOSIS — Z951 Presence of aortocoronary bypass graft: Secondary | ICD-10-CM

## 2023-12-05 DIAGNOSIS — Z95828 Presence of other vascular implants and grafts: Secondary | ICD-10-CM | POA: Diagnosis not present

## 2023-12-05 DIAGNOSIS — Z8249 Family history of ischemic heart disease and other diseases of the circulatory system: Secondary | ICD-10-CM

## 2023-12-05 DIAGNOSIS — Z888 Allergy status to other drugs, medicaments and biological substances status: Secondary | ICD-10-CM

## 2023-12-05 LAB — CBC
HCT: 36.1 % — ABNORMAL LOW (ref 39.0–52.0)
Hemoglobin: 11.4 g/dL — ABNORMAL LOW (ref 13.0–17.0)
MCH: 30.2 pg (ref 26.0–34.0)
MCHC: 31.6 g/dL (ref 30.0–36.0)
MCV: 95.5 fL (ref 80.0–100.0)
Platelets: 184 K/uL (ref 150–400)
RBC: 3.78 MIL/uL — ABNORMAL LOW (ref 4.22–5.81)
RDW: 13.2 % (ref 11.5–15.5)
WBC: 11.4 K/uL — ABNORMAL HIGH (ref 4.0–10.5)
nRBC: 0 % (ref 0.0–0.2)

## 2023-12-05 LAB — BASIC METABOLIC PANEL WITH GFR
Anion gap: 9 (ref 5–15)
BUN: 23 mg/dL (ref 8–23)
CO2: 24 mmol/L (ref 22–32)
Calcium: 8.8 mg/dL — ABNORMAL LOW (ref 8.9–10.3)
Chloride: 103 mmol/L (ref 98–111)
Creatinine, Ser: 1.39 mg/dL — ABNORMAL HIGH (ref 0.61–1.24)
GFR, Estimated: 56 mL/min — ABNORMAL LOW (ref >60)
Glucose, Bld: 379 mg/dL — ABNORMAL HIGH (ref 70–99)
Potassium: 4 mmol/L (ref 3.5–5.1)
Sodium: 136 mmol/L (ref 135–145)

## 2023-12-05 LAB — CBG MONITORING, ED
Glucose-Capillary: 157 mg/dL — ABNORMAL HIGH (ref 70–99)
Glucose-Capillary: 164 mg/dL — ABNORMAL HIGH (ref 70–99)

## 2023-12-05 LAB — TROPONIN I (HIGH SENSITIVITY)
Troponin I (High Sensitivity): 39 ng/L — ABNORMAL HIGH (ref <18)
Troponin I (High Sensitivity): 84 ng/L — ABNORMAL HIGH (ref <18)

## 2023-12-05 LAB — BRAIN NATRIURETIC PEPTIDE: B Natriuretic Peptide: 1327.3 pg/mL — ABNORMAL HIGH (ref 0.0–100.0)

## 2023-12-05 MED ORDER — INSULIN ASPART 100 UNIT/ML IJ SOLN
0.0000 [IU] | Freq: Three times a day (TID) | INTRAMUSCULAR | Status: DC
  Administered 2023-12-05 – 2023-12-06 (×2): 3 [IU] via SUBCUTANEOUS
  Administered 2023-12-06: 2 [IU] via SUBCUTANEOUS
  Administered 2023-12-06: 3 [IU] via SUBCUTANEOUS
  Administered 2023-12-07: 2 [IU] via SUBCUTANEOUS
  Administered 2023-12-07 (×2): 3 [IU] via SUBCUTANEOUS
  Filled 2023-12-05 (×2): qty 3
  Filled 2023-12-05 (×2): qty 2
  Filled 2023-12-05 (×3): qty 3
  Filled 2023-12-05: qty 2

## 2023-12-05 MED ORDER — TAMSULOSIN HCL 0.4 MG PO CAPS
0.4000 mg | ORAL_CAPSULE | Freq: Every day | ORAL | Status: DC
  Administered 2023-12-05 – 2023-12-07 (×3): 0.4 mg via ORAL
  Filled 2023-12-05 (×3): qty 1

## 2023-12-05 MED ORDER — INSULIN GLARGINE-YFGN 100 UNIT/ML ~~LOC~~ SOLN
20.0000 [IU] | Freq: Every day | SUBCUTANEOUS | Status: DC
  Administered 2023-12-05 – 2023-12-08 (×4): 20 [IU] via SUBCUTANEOUS
  Filled 2023-12-05 (×6): qty 0.2

## 2023-12-05 MED ORDER — NITROGLYCERIN 2 % TD OINT
1.0000 [in_us] | TOPICAL_OINTMENT | Freq: Once | TRANSDERMAL | Status: AC
  Administered 2023-12-05: 1 [in_us] via TOPICAL
  Filled 2023-12-05: qty 1

## 2023-12-05 MED ORDER — ENOXAPARIN SODIUM 40 MG/0.4ML IJ SOSY
40.0000 mg | PREFILLED_SYRINGE | INTRAMUSCULAR | Status: DC
  Administered 2023-12-05 – 2023-12-07 (×3): 40 mg via SUBCUTANEOUS
  Filled 2023-12-05 (×3): qty 0.4

## 2023-12-05 MED ORDER — AMIODARONE HCL 200 MG PO TABS
100.0000 mg | ORAL_TABLET | Freq: Every day | ORAL | Status: DC
  Administered 2023-12-05 – 2023-12-08 (×4): 100 mg via ORAL
  Filled 2023-12-05 (×4): qty 1

## 2023-12-05 MED ORDER — SERTRALINE HCL 25 MG PO TABS
50.0000 mg | ORAL_TABLET | Freq: Every day | ORAL | Status: DC
  Administered 2023-12-05 – 2023-12-07 (×3): 50 mg via ORAL
  Filled 2023-12-05: qty 1
  Filled 2023-12-05 (×2): qty 2

## 2023-12-05 MED ORDER — FUROSEMIDE 10 MG/ML IJ SOLN
40.0000 mg | Freq: Once | INTRAMUSCULAR | Status: AC
  Administered 2023-12-05: 40 mg via INTRAVENOUS
  Filled 2023-12-05: qty 4

## 2023-12-05 MED ORDER — SACUBITRIL-VALSARTAN 49-51 MG PO TABS
1.0000 | ORAL_TABLET | Freq: Two times a day (BID) | ORAL | Status: DC
  Administered 2023-12-05 – 2023-12-08 (×7): 1 via ORAL
  Filled 2023-12-05 (×8): qty 1

## 2023-12-05 MED ORDER — AMLODIPINE BESYLATE 5 MG PO TABS
5.0000 mg | ORAL_TABLET | Freq: Every day | ORAL | Status: DC
  Administered 2023-12-05: 5 mg via ORAL
  Filled 2023-12-05: qty 1

## 2023-12-05 MED ORDER — ROSUVASTATIN CALCIUM 20 MG PO TABS
40.0000 mg | ORAL_TABLET | Freq: Every day | ORAL | Status: DC
  Administered 2023-12-05 – 2023-12-07 (×3): 40 mg via ORAL
  Filled 2023-12-05 (×3): qty 2

## 2023-12-05 MED ORDER — FUROSEMIDE 10 MG/ML IJ SOLN
40.0000 mg | Freq: Two times a day (BID) | INTRAMUSCULAR | Status: DC
  Administered 2023-12-05 – 2023-12-06 (×4): 40 mg via INTRAVENOUS
  Filled 2023-12-05 (×4): qty 4

## 2023-12-05 MED ORDER — CARVEDILOL 25 MG PO TABS
25.0000 mg | ORAL_TABLET | Freq: Two times a day (BID) | ORAL | Status: DC
  Administered 2023-12-05 – 2023-12-08 (×6): 25 mg via ORAL
  Filled 2023-12-05 (×2): qty 1
  Filled 2023-12-05 (×2): qty 2
  Filled 2023-12-05 (×2): qty 1

## 2023-12-05 MED ORDER — ASPIRIN 81 MG PO TBEC
81.0000 mg | DELAYED_RELEASE_TABLET | Freq: Every day | ORAL | Status: DC
  Administered 2023-12-05 – 2023-12-08 (×4): 81 mg via ORAL
  Filled 2023-12-05 (×4): qty 1

## 2023-12-05 NOTE — H&P (Addendum)
 History and Physical    Patient: Martin Bush FMW:[id number] DOB: 1955-02-09 DOA: 12/05/2023 DOS: the patient was seen and examined on 12/05/2023 PCP: [facility]  Patient coming from: Home  Chief Complaint:  Chief Complaint  Patient presents with   Chest Pain   Shortness of Breath   HPI: Martin Bush is a 68 y.o. male with medical history significant of CAD s/p CABG x2 in 2002 (only LIMA to LAD being patent) and PCI (left main into superdominant LCx on 10/09/2019), ischemic cardiomyopathy, HFrEF (EF 35-40% in 08/2023), recurrent sustained VT s/p ICD implantation, hypertension, mixed hyperlipidemia, controlled diabetes mellitus, stage IIIa chronic kidney disease and bilateral carotid artery stenting (left TCAR on 05/26/2020 and a right TCAR on 07/07/2020) p/w SOB/DOE, BLE edema, and elevated BNP c/w HFrEF exacerbation.  The patient presented with shortness of breath and chest pain that began 11:30p the previous day. The symptoms were described as occurring primarily when moving, with some relief while resting. The chest pain was located in the center of the chest. The decision to come to the hospital was made around 2:30-3:00am after his SOB failed to improve despite bedrest. Of note, pt endorses eating fast food most days.  In the ED, pt hypertensive and tachypneic (2L [place] on arrival; RA pta). Labs notable for Cr 1.39 which is baseline, BNP 1327, troponin 39-->84, and WBC 11.4. CXR showed prominent pulmonary vascularization c/w cephalization c/f hypervolemia. EDP requested medicine admission.   Review of Systems: As mentioned in the history of present illness. All other systems reviewed and are negative. Past Medical History:  Diagnosis Date   Arthritis    thumbs  (08/02/2017)   CHF (congestive heart failure) (HCC)    Chronic combined systolic and diastolic heart failure (HCC)    a. 08/2017 Echo: EF 20-25%, mod glob HK. Sev distal ant sept, inflat HK. Apical AK. Gr2 DD.  Mildly reduced RV fxn.   Chronic kidney disease    III, stage 3   Colon polyps    Coronary artery disease    Depression    Diabetes mellitus without complication (HCC)    Dysrhythmia    hx of V. tach   Elevated PSA    History of hiatal hernia    Hyperlipidemia    Hypertension    ICD; Biventricular  Medtronic ICD Amplia MRI QWuad CRT-D  in situ 10/29/14 10/29/2014   Ischemic cardiomyopathy 09/27/2018   Neuromuscular disorder (HCC)    Sciatica, right side   NSTEMI (non-ST elevated myocardial infarction) (HCC) 07/16/2014   OSA on CPAP    Peripheral vascular disease    Pneumonia    Prostate cancer (HCC) 07/25/2023   [facility]   Proteinuria    Past Surgical History:  Procedure Laterality Date   BIOPSY  09/19/2018   Procedure: BIOPSY;  Surgeon: Eda Iha, MD;  [facility];  Service: Gastroenterology;;   CARDIAC CATHETERIZATION N/A 07/16/2014   Procedure: Left Heart Cath and Coronary Angiography;  Surgeon: Gordy Bergamo, MD;  Location: [facility] INVASIVE CV LAB;  Service: Cardiovascular;  Laterality: N/A;   CARDIAC CATHETERIZATION  07/16/2014   Procedure: Coronary Balloon Angioplasty;  Surgeon: Gordy Bergamo, MD;  Location[facility] INVASIVE CV LAB;  Service: Cardiovascular;;   CARDIAC CATHETERIZATION  2003   CARDIAC DEFIBRILLATOR PLACEMENT  2016   CATARACT EXTRACTION Left 2018   CATARACT EXTRACTION W/ INTRAOCULAR LENS IMPLANT Left 03/2014   COLONOSCOPY     COLONOSCOPY WITH PROPOFOL  N/A 09/19/2018   Procedure: COLONOSCOPY WITH PROPOFOL ;  Surgeon:  Eda Iha, MD;  Location: [facility];  Service: Gastroenterology;  Laterality: N/A;   COLONOSCOPY WITH PROPOFOL  N/A 05/12/2023   Procedure: COLONOSCOPY WITH PROPOFOL ;  Surgeon: Wilhelmenia Aloha Raddle., MD;  [facility];  Service: Gastroenterology;  Laterality: N/A;   CORONARY ANGIOPLASTY WITH STENT PLACEMENT     I've got a total of 8 stents in there; mostly doine at [facility] (08/02/2017)   CORONARY ARTERY BYPASS GRAFT  ~  2003   CABG X2; [facility]   CORONARY ATHERECTOMY N/A 08/16/2017   Procedure: CORONARY ATHERECTOMY;  Surgeon: Elmira Newman PARAS, MD;  [facility];  Service: Cardiovascular;  Laterality: N/A;   CORONARY BALLOON ANGIOPLASTY N/A 08/04/2017   Procedure: CORONARY BALLOON ANGIOPLASTY;  Surgeon: Ladona Heinz, MD;  [facility] LAB;  Service: Cardiovascular;  Laterality: N/A;   CORONARY BALLOON ANGIOPLASTY N/A 01/11/2019   Procedure: CORONARY BALLOON ANGIOPLASTY;  Surgeon: Ladona Heinz, MD;  [facility];  Service: Cardiovascular;  Laterality: N/A;   CORONARY BALLOON ANGIOPLASTY N/A 10/09/2019   Procedure: CORONARY BALLOON ANGIOPLASTY;  Surgeon: Ladona Heinz, MD;  [facility];  Service: Cardiovascular;  Laterality: N/A;   CORONARY STENT INTERVENTION N/A 08/16/2017   Procedure: CORONARY STENT INTERVENTION;  Surgeon: Elmira Newman PARAS, MD;  [facility];  Service: Cardiovascular;  Laterality: N/A;   CORONARY STENT INTERVENTION N/A 10/09/2019   Procedure: CORONARY STENT INTERVENTION;  Surgeon: Ladona Heinz, MD;  [facility];  Service: Cardiovascular;  Laterality: N/A;   ELBOW SURGERY Left ?2001   pinched nerve   ENDOSCOPIC MUCOSAL RESECTION  12/18/2018   Procedure: ENDOSCOPIC MUCOSAL RESECTION;  Surgeon: Wilhelmenia Aloha Raddle., MD;  Location: [facility] ENDOSCOPY;  Service: Gastroenterology;;   EYE SURGERY Bilateral    cataract removal   FLEXIBLE SIGMOIDOSCOPY N/A 12/18/2018   Procedure: FLEXIBLE SIGMOIDOSCOPY;  Surgeon: Wilhelmenia Aloha Raddle., MD;  Location: [facility] ENDOSCOPY;  Service: Gastroenterology;  Laterality: N/A;   HEMOSTASIS CLIP PLACEMENT  12/18/2018   Procedure: HEMOSTASIS CLIP PLACEMENT;  Surgeon: Wilhelmenia Aloha Raddle., MD;  Location: [facility] ENDOSCOPY;  Service: Gastroenterology;;   ICD GENERATOR CHANGEOUT N/A 03/08/2023   Procedure: ICD GENERATOR CHANGEOUT;  Surgeon: Kennyth Chew, MD;  Location: [facility]  INVASIVE CV LAB;  Service: Cardiovascular;  Laterality: N/A;   LEFT HEART CATH AND CORONARY ANGIOGRAPHY N/A 01/11/2019   Procedure: LEFT HEART CATH AND CORONARY ANGIOGRAPHY;  Surgeon: Ladona Heinz, MD;  [facility];  Service: Cardiovascular;  Laterality: N/A;   LEFT HEART CATH AND CORS/GRAFTS ANGIOGRAPHY N/A 08/04/2017   Procedure: LEFT HEART CATH AND CORS/GRAFTS ANGIOGRAPHY;  Surgeon: Ladona Heinz, MD;  [facility];  Service: Cardiovascular;  Laterality: N/A;   LEFT HEART CATH AND CORS/GRAFTS ANGIOGRAPHY N/A 08/20/2017   Procedure: LEFT HEART CATH AND CORS/GRAFTS ANGIOGRAPHY;  Surgeon: Elmira Newman PARAS, MD;  [facility];  Service: Cardiovascular;  Laterality: N/A;   LEFT HEART CATH AND CORS/GRAFTS ANGIOGRAPHY N/A 01/08/2019   Procedure: LEFT HEART CATH AND CORS/GRAFTS ANGIOGRAPHY;  Surgeon: Darron Deatrice LABOR, MD;  Location: [facility] INVASIVE CV LAB;  Service: Cardiovascular;  Laterality: N/A;   LEFT HEART CATH AND CORS/GRAFTS ANGIOGRAPHY N/A 10/09/2019   Procedure: LEFT HEART CATH AND CORS/GRAFTS ANGIOGRAPHY;  Surgeon: Ladona Heinz, MD;  [facility];  Service: Cardiovascular;  Laterality: N/A;   LEFT HEART CATH AND CORS/GRAFTS ANGIOGRAPHY N/A 12/11/2019   Procedure: LEFT HEART CATH AND CORS/GRAFTS ANGIOGRAPHY;  Surgeon: Elmira Newman PARAS,  MD;  [facility];  Service: Cardiovascular;  Laterality: N/A;   POLYPECTOMY  09/19/2018   Procedure: POLYPECTOMY;  Surgeon: Eda Iha, MD;  [facility];  Service: Gastroenterology;;   POLYPECTOMY  05/12/2023   Procedure: POLYPECTOMY, INTESTINE;  Surgeon: Wilhelmenia Aloha Raddle., MD;  Location: [facility];  Service: Gastroenterology;;   PROSTATE BIOPSY     RADIOACTIVE SEED IMPLANT N/A 11/24/2023   Procedure: INSERTION, RADIATION SOURCE, PROSTATE;  Surgeon: Renda Glance, MD;  Location[facility] ORS;  Service: Urology;  Laterality: N/A;   SPACE OAR INSTILLATION N/A  11/24/2023   Procedure: INJECTION, HYDROGEL SPACER;  Surgeon: Renda Glance, MD;  Location[facility];  Service: Urology;  Laterality: N/A;   SUBMUCOSAL INJECTION  09/19/2018   Procedure: SUBMUCOSAL INJECTION;  Surgeon: Eda Iha, MD;  [facility];  Service: Gastroenterology;;   ROBLEY LIFTING INJECTION  12/18/2018   Procedure: SUBMUCOSAL LIFTING INJECTION;  Surgeon: Wilhelmenia Aloha Raddle., MD;  Location: [facility] ENDOSCOPY;  Service: Gastroenterology;;   TRANSCAROTID ARTERY REVASCULARIZATION  Left 05/26/2020   Procedure: LEFT TRANSCAROTID ARTERY REVASCULARIZATION;  Surgeon: Gretta Lonni PARAS, MD;  Location: [facility] OR;  Service: Vascular;  Laterality: Left;   TRANSCAROTID ARTERY REVASCULARIZATION  Right 07/07/2020   Procedure: RIGHT TRANSCAROTID ARTERY REVASCULARIZATION;  Surgeon: Gretta Lonni PARAS, MD;  [facility];  Service: Vascular;  Laterality: Right;   ULTRASOUND GUIDANCE FOR VASCULAR ACCESS Right 05/26/2020   Procedure: ULTRASOUND GUIDANCE FOR VASCULAR ACCESS;  Surgeon: Gretta Lonni PARAS, MD;  Location: [facility] OR;  Service: Vascular;  Laterality: Right;   ULTRASOUND GUIDANCE FOR VASCULAR ACCESS Left 07/07/2020   Procedure: ULTRASOUND GUIDANCE FOR VASCULAR ACCESS;  Surgeon: Gretta Lonni PARAS, MD;  Location: [facility] OR;  Service: Vascular;  Laterality: Left;   Social History:  reports that he quit smoking about 23 years ago. His smoking use included cigars and cigarettes. He started smoking about 50 years ago. He has a 6.8 pack-year smoking history. He quit smokeless tobacco use about 23 years ago.  His smokeless tobacco use included chew. He reports that he does not currently use alcohol. He reports that he does not use drugs.  Allergies  Allergen Reactions   Diltiazem Rash    Family History  Problem Relation Age of Onset   Uterine cancer Mother    Lung cancer Mother    Hyperlipidemia Father    Heart disease Father    Hypertension Father    Diabetes Father     Hypertension Sister    Hypertension Brother    Colon cancer Neg Hx     Prior to Admission medications   Medication Sig Start Date End Date Taking? Authorizing Provider  amiodarone  (PACERONE ) 200 MG tablet Take 0.5 tablets (100 mg total) by mouth daily. 10/07/20  Yes Ladona Heinz, MD  amLODipine  (NORVASC ) 5 MG tablet Take 5 mg by mouth daily.   Yes [provider]  aspirin  EC 81 MG EC tablet Take 1 tablet (81 mg total) by mouth daily. 01/08/19  Yes Dickie Begun, MD  calcium  carbonate (OS-CAL) 1250 (500 Ca) MG chewable tablet Chew 1 tablet by mouth daily.   Yes [provider]  carvedilol  (COREG ) 25 MG tablet Take 1 tablet (25 mg total) by mouth 2 (two) times daily with a meal. 10/26/21  Yes Ladona Heinz, MD  Cholecalciferol  (VITAMIN D3) 50 MCG (2000 UT) TABS Take 2,000 Units by mouth 2 (two) times daily.    Yes [provider]  empagliflozin  (JARDIANCE ) 25 MG TABS tablet Take 25 mg  by mouth daily.   Yes [provider]  ezetimibe  (ZETIA ) 10 MG tablet Take 1 tablet (10 mg total) by mouth daily. 08/03/23 12/04/24 Yes Ladona Heinz, MD  ferrous sulfate  325 (65 FE) MG tablet Take 325 mg by mouth every Monday, Wednesday, and Friday. In the morning 11/06/21  Yes [provider]  furosemide  (LASIX ) 20 MG tablet Take 20 mg by mouth daily.   Yes [provider]  insulin  isophane & regular human KwikPen (NOVOLIN 70/30 KWIKPEN) (70-30) 100 UNIT/ML KwikPen Inject 24 Units into the skin daily as needed (for blood glucose 180 or greater).   Yes [provider]  metFORMIN  (GLUCOPHAGE ) 500 MG tablet Take 500 mg by mouth 2 (two) times daily with a meal.   Yes [provider]  nitroGLYCERIN  (NITROSTAT ) 0.4 MG SL tablet Place 1 tablet (0.4 mg total) under the tongue every 5 (five) minutes x 3 doses as needed for chest pain. 06/03/22  Yes Ladona Heinz, MD  OXYGEN  Inhale 2 L/min into the lungs See admin instructions. 2 L/min at bedtime in conjunction with CPAP     Yes [provider]  PRESCRIPTION MEDICATION Inhale into the lungs See admin instructions. CPAP- At bedtime   Yes [provider]  relugolix (ORGOVYX) 120 MG tablet Take 120 mg by mouth daily.   Yes [provider]  rosuvastatin  (CRESTOR ) 40 MG tablet Take 1 tablet (40 mg total) by mouth at bedtime. 01/08/19  Yes Dickie Begun, MD  sacubitril -valsartan  (ENTRESTO ) 49-51 MG Take 1 tablet by mouth 2 (two) times daily. 11/26/21  Yes Arrien, Elidia Sieving, MD  sertraline  (ZOLOFT ) 50 MG tablet Take 50 mg by mouth at bedtime.   Yes [provider]  tamsulosin (FLOMAX) 0.4 MG CAPS capsule Take 1 capsule (0.4 mg total) by mouth at bedtime. 11/24/23  Yes Renda Glance, MD  tirzepatide [facility]) 7.5 MG/0.5ML Pen Inject 7.5 mg into the skin once a week.   Yes [provider]  traMADol (ULTRAM) 50 MG tablet Take 1-2 tablets (50-100 mg total) by mouth every 6 (six) hours as needed (pain). 11/24/23  Yes Renda Glance, MD    Physical Exam: Vitals:   12/05/23 0728 12/05/23 0753 12/05/23 0830 12/05/23 0845  BP:   (!) 141/81 (!) 142/80  Pulse:   77 80  Resp:   18 (!) 21  Temp:  98 F (36.7 C)    TempSrc:  Oral    SpO2: 99%  100% 98%  Weight:      Height:       General: Alert, oriented x3, resting comfortably in no acute distress Respiratory: Lungs clear to auscultation bilaterally with normal respiratory effort; no w/r/r Cardiovascular: Regular rate and rhythm w/o m/r/g   Data Reviewed:  Lab Results  Component Value Date   WBC 11.4 (H) 12/05/2023   HGB 11.4 (L) 12/05/2023   HCT 36.1 (L) 12/05/2023   MCV 95.5 12/05/2023   PLT 184 12/05/2023   Lab Results  Component Value Date   GLUCOSE 379 (H) 12/05/2023   CALCIUM  8.8 (L) 12/05/2023   NA 136 12/05/2023   K 4.0 12/05/2023   CO2 24 12/05/2023   CL 103 12/05/2023   BUN 23 12/05/2023   CREATININE 1.39 (H) 12/05/2023   Lab Results  Component Value Date   ALT 13 11/09/2023   AST 19  11/09/2023   ALKPHOS 137 (H) 11/09/2023   BILITOT 0.5 11/09/2023   Lab Results  Component Value Date   INR 1.1 07/02/2020  INR 1.0 05/23/2020   INR 1.0 01/03/2019   Radiology: DG Chest 2 View Result Date: 12/05/2023 EXAM: 2 VIEW(S) XRAY OF THE CHEST 12/05/2023 04:11:00 AM COMPARISON: 11/24/2021 PET CT 08/31/2023 08/31/2023). CLINICAL HISTORY: 69 year old male with shortness of breath. FINDINGS: LINES, TUBES AND DEVICES: Left subclavian AICD in place. LUNGS AND PLEURA: Mildly improved lung volumes. 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 Regressed pulmonary vascularity compared to the prior x ray. No overt edema. No areas of worsening ventilation. No pneumothorax. HEART AND MEDIASTINUM: Aortic atherosclerosis. Chronic sternotomy, CABG, ACDF are stable. No acute abnormality of the cardiac and mediastinal silhouettes. BONES AND SOFT TISSUES: No acute osseous abnormality. IMPRESSION: 1. No acute cardiopulmonary abnormality. 2. 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 Electronically signed by: Helayne Hurst MD 12/05/2023 04:30 AM EST RP Workstation: [id number]    Assessment and Plan: 81M h/o CAD s/p CABG x2 in 2002 (only LIMA to LAD being patent) and PCI (left main into superdominant LCx on 10/09/2019), ischemic cardiomyopathy, HFrEF (EF 35-40% in 08/2023), recurrent sustained VT s/p ICD implantation, hypertension, mixed hyperlipidemia, controlled diabetes mellitus, stage IIIa chronic kidney disease and bilateral carotid artery stenting (left TCAR on 05/26/2020 and a right TCAR on 07/07/2020) p/w SOB/DOE, BLE edema, and elevated BNP c/w HFrEF exacerbation.  HFrEF exacerbation -IV lasix  40mg  BID for now; goal net neg 1-2L/d; strict I/Os; daily standing weights; K>4/Mg>2; consider discharging home on higher dose PO lasix  40mg   daily -PTA ASA, amiodarone , Coreg , Crestor , and Entresto   DM2 -Diabetes coordinator consulted; apprec eval/recs -Lantus  20U daily and SSI TID AC prn  HLD -PTA Crestor   BPH -PTA Flomax 0.4mg  daily  Mood disorder -PTA Zoloft  50mg  daily   Advance Care Planning:   Code Status: Full Code   Consults: N/A  Family Communication: Wife  Severity of Illness: The appropriate patient status for this patient is INPATIENT. Inpatient status is judged to be reasonable and necessary in order to provide the required intensity of service to ensure the patient's safety. The patient's presenting symptoms, physical exam findings, and initial radiographic and laboratory data in the context of their chronic comorbidities is felt to place them at high risk for further clinical deterioration. Furthermore, it is not anticipated that the patient will be medically stable for discharge from the hospital within 2 midnights of admission.   * I certify that at the point of admission it is my clinical judgment that the patient will require inpatient hospital care spanning beyond 2 midnights from the point of admission due to high intensity of service, high risk for further deterioration and high frequency of surveillance required.*   ------- I spent 57 minutes reviewing previous notes, at the bedside counseling/discussing the treatment plan, and performing clinical documentation.  Author: Marsha Ada, MD 12/05/2023 11:03 AM  For on call review [email or web address].

## 2023-12-05 NOTE — ED Notes (Signed)
Requested medication from pharmacy.

## 2023-12-05 NOTE — Inpatient Diabetes Management (Signed)
 Inpatient Diabetes Program Recommendations  AACE/ADA: New Consensus Statement on Inpatient Glycemic Control (2015)  Target Ranges:  Prepandial:   less than 140 mg/dL      Peak postprandial:   less than 180 mg/dL (1-2 hours)      Critically ill patients:  140 - 180 mg/dL   Lab Results  Component Value Date   GLUCAP 128 (H) 11/24/2023   HGBA1C 8.7 (H) 11/09/2023    Latest Reference Range & Units 12/05/23 03:44  Glucose 70 - 99 mg/dL 620 (H)  (H): Data is abnormally high   Diabetes history: DM2 Outpatient Diabetes medications:  70/30 24 units daily Jardiance  25 mg daily Metformin  500 mg bid Mounjaro 7.5 weekly  Current orders for Inpatient glycemic control: None  Inpatient Diabetes Program Recommendations:   Please consider: -Add Glycemic control order set with 0-9 units tid, 0-5 units hs -Add Lantus  20 units now and daily (0.2 units/kg x 98.9)  Thank you, Lynden Carrithers E. Oumar Marcott, RN, MSN, CNS, CDCES  Diabetes Coordinator Inpatient Glycemic Control Team Team Pager [phone] (8am-5pm) 12/05/2023 1:40 PM

## 2023-12-05 NOTE — ED Triage Notes (Signed)
 Patient BIB EMS from home for evaluation of chest pain and shortness of breath x3-4 hours. Patient reports 10lb weight gain in past week. 85% RA. 93% on 6L with EMS. Patient reports he only wear 2L [place] at night.   324mg  aspirin  3 nitroglycerin   18 L FA

## 2023-12-05 NOTE — ED Notes (Signed)
 Still awaiting medication from pharm

## 2023-12-05 NOTE — ED Provider Notes (Signed)
 [place] EMERGENCY DEPARTMENT AT [facility] Provider Note   CSN: [id number] Arrival date & time: 12/05/23  9661     Patient presents with: Chest Pain and Shortness of Breath   Martin Bush is a 68 y.o. male.   The history is provided by the patient and the spouse.  Patient with history of diabetes, ischemic heart disease, CHF prostate cancer, presents with increasing shortness of breath.  Patient underwent radioactive seed placement for prostate cancer around October 30.  Since that time he had increasing shortness of breath.  He reports weight gain (10lbs) as well as orthopnea and dyspnea on exertion.  He has been having intermittent sharp chest pain as well.  No fevers or vomiting. He is a non-smoker He reports his symptoms worsened tonight. Patient reports he wears 2 L at night EMS reports patient was 85% on room air and 93% on 6 L Given aspirin  and nitroglycerin  prior to arrival He denies any other recent complication from the procedure   Past Medical History:  Diagnosis Date   Arthritis    thumbs  (08/02/2017)   CHF (congestive heart failure) (HCC)    Chronic combined systolic and diastolic heart failure (HCC)    a. 08/2017 Echo: EF 20-25%, mod glob HK. Sev distal ant sept, inflat HK. Apical AK. Gr2 DD. Mildly reduced RV fxn.   Chronic kidney disease    III, stage 3   Colon polyps    Coronary artery disease    Depression    Diabetes mellitus without complication (HCC)    Dysrhythmia    hx of V. tach   Elevated PSA    History of hiatal hernia    Hyperlipidemia    Hypertension    ICD; Biventricular  Medtronic ICD Amplia MRI QWuad CRT-D  in situ 10/29/14 10/29/2014   Ischemic cardiomyopathy 09/27/2018   Neuromuscular disorder (HCC)    Sciatica, right side   NSTEMI (non-ST elevated myocardial infarction) (HCC) 07/16/2014   OSA on CPAP    Peripheral vascular disease    Pneumonia    Prostate cancer (HCC) 07/25/2023   [facility]   Proteinuria     Prior to  Admission medications   Medication Sig Start Date End Date Taking? Authorizing Provider  amiodarone  (PACERONE ) 200 MG tablet Take 0.5 tablets (100 mg total) by mouth daily. 10/07/20   Ladona Gordy, MD  aspirin  EC 81 MG EC tablet Take 1 tablet (81 mg total) by mouth daily. 01/08/19   Dickie Begun, MD  calcium  carbonate (OS-CAL) 1250 (500 Ca) MG chewable tablet Chew 1 tablet by mouth daily.    [provider]  carvedilol  (COREG ) 25 MG tablet Take 1 tablet (25 mg total) by mouth 2 (two) times daily with a meal. 10/26/21   Ladona Gordy, MD  Cholecalciferol  (VITAMIN D3) 50 MCG (2000 UT) TABS Take 2,000 Units by mouth 2 (two) times daily.     [provider]  ezetimibe  (ZETIA ) 10 MG tablet Take 1 tablet (10 mg total) by mouth daily. 08/03/23 11/07/23  Ladona Gordy, MD  ferrous sulfate  325 (65 FE) MG tablet Take 325 mg by mouth every Monday, Wednesday, and Friday. In the morning 11/06/21   [provider]  furosemide  (LASIX ) 20 MG tablet Take 20 mg by mouth daily.    [provider]  metFORMIN  (GLUCOPHAGE -XR) 500 MG 24 hr tablet Take 500 mg by mouth 2 (two) times daily.    [provider]  nitroGLYCERIN  (NITROSTAT ) 0.4 MG SL tablet Place  1 tablet (0.4 mg total) under the tongue every 5 (five) minutes x 3 doses as needed for chest pain. 06/03/22   Ladona Heinz, MD  OXYGEN  Inhale 2 L/min into the lungs See admin instructions. 2 L/min at bedtime in conjunction with CPAP     [provider]  PRESCRIPTION MEDICATION Inhale into the lungs See admin instructions. CPAP- At bedtime    [provider]  relugolix (ORGOVYX) 120 MG tablet Take 120 mg by mouth daily.    [provider]  rosuvastatin  (CRESTOR ) 40 MG tablet Take 1 tablet (40 mg total) by mouth at bedtime. 01/08/19   Dickie Begun, MD  sacubitril -valsartan  (ENTRESTO ) 49-51 MG Take 1 tablet by mouth 2 (two) times daily. 11/26/21   Arrien, Mauricio Daniel, MD  sertraline  (ZOLOFT ) 50 MG tablet Take 50 mg  by mouth at bedtime.    [provider]  tamsulosin (FLOMAX) 0.4 MG CAPS capsule Take 1 capsule (0.4 mg total) by mouth at bedtime. 11/24/23   Renda Glance, MD  tirzepatide [facility]) 7.5 MG/0.5ML Pen Inject 7.5 mg into the skin once a week.    [provider]  traMADol (ULTRAM) 50 MG tablet Take 1-2 tablets (50-100 mg total) by mouth every 6 (six) hours as needed (pain). 11/24/23   Renda Glance, MD    Allergies: Diltiazem    Review of Systems  Constitutional:  Positive for unexpected weight change. Negative for fever.  Cardiovascular:  Positive for chest pain and leg swelling.  Gastrointestinal:  Negative for abdominal pain and vomiting.    Updated Vital Signs BP (!) 165/88   Pulse 96   Temp 98.2 F (36.8 C)   Resp (!) 22   Ht 1.702 m (5' 7)   Wt 98.9 kg   SpO2 93%   BMI 34.14 kg/m   Physical Exam CONSTITUTIONAL: Chronically ill-appearing HEAD: Normocephalic/atraumatic EYES: EOMI/PERRL ENMT: Mucous membranes moist NECK: supple no meningeal signs, no JVD CV: S1/S2 noted, no murmurs/rubs/gallops noted LUNGS tachypneic, crackles bilaterally ABDOMEN: soft, nontender NEURO: Pt is awake/alert/appropriate, moves all extremitiesx4.  No facial droop.   EXTREMITIES: pulses normal/equal, full ROM, symmetric pitting lower extremity edema noted SKIN: warm, color normal PSYCH: no abnormalities of mood noted, alert and oriented to situation  (all labs ordered are listed, but only abnormal results are displayed) Labs Reviewed  BASIC METABOLIC PANEL WITH GFR - Abnormal; Notable for the following components:      Result Value   Glucose, Bld 379 (*)    Creatinine, Ser 1.39 (*)    Calcium  8.8 (*)    GFR, Estimated 56 (*)    All other components within normal limits  CBC - Abnormal; Notable for the following components:   WBC 11.4 (*)    RBC 3.78 (*)    Hemoglobin 11.4 (*)    HCT 36.1 (*)    All other components within normal limits  BRAIN NATRIURETIC PEPTIDE  - Abnormal; Notable for the following components:   B Natriuretic Peptide 1,327.3 (*)    All other components within normal limits  TROPONIN I (HIGH SENSITIVITY) - Abnormal; Notable for the following components:   Troponin I (High Sensitivity) 39 (*)    All other components within normal limits  TROPONIN I (HIGH SENSITIVITY)    EKG: EKG Interpretation Date/Time:  Monday December 05 2023 03:48:16 EST Ventricular Rate:  98 PR Interval:  138 QRS Duration:  177 QT Interval:  431 QTC Calculation: 551 R Axis:   263  Text Interpretation: paced rhythm Nonspecific  IVCD with LAD Borderline T abnormalities, lateral leads No significant change since last tracing Interpretation limited secondary to artifact Confirmed by Midge Golas ([id number]) on 12/05/2023 3:56:47 AM Prehospital EKG reviewed, no acute change   Radiology: DG Chest 2 View Result Date: 12/05/2023 EXAM: 2 VIEW(S) XRAY OF THE CHEST 12/05/2023 04:11:00 AM COMPARISON: 11/24/2021 PET CT 08/31/2023 08/31/2023). CLINICAL HISTORY: 68 year old male with shortness of breath. FINDINGS: LINES, TUBES AND DEVICES: Left subclavian AICD in place. LUNGS AND PLEURA: Mildly improved lung volumes. 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 Regressed pulmonary vascularity compared to the prior x ray. No overt edema. No areas of worsening ventilation. No pneumothorax. HEART AND MEDIASTINUM: Aortic atherosclerosis. Chronic sternotomy, CABG, ACDF are stable. No acute abnormality of the cardiac and mediastinal silhouettes. BONES AND SOFT TISSUES: No acute osseous abnormality. IMPRESSION: 1. No acute cardiopulmonary abnormality. 2. 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 Electronically signed by: Helayne Hurst MD 12/05/2023 04:30 AM EST RP Workstation: [id number]     Procedures    Medications Ordered in the ED  nitroGLYCERIN  (NITROGLYN) 2 % ointment 1 inch (1 inch Topical Given 12/05/23 0439)  furosemide  (LASIX ) injection 40 mg (40 mg Intravenous Given 12/05/23 0439)    Clinical Course as of 12/05/23 0536  Mon Dec 05, 2023  0446 Patient presents with increasing shortness of breath over the past 10-11 days since having radioactive seed placement.  Clinically appears to be in CHF.  Nitroglycerin  and Lasix  have been ordered [DW]  0534 Given his history, labs including elevated BNP patient is likely in CHF.  He has no chest pain at this time.  Plan will be to admit for blood pressure management as well as diuresis  Discussed with Dr. Alfornia for admission [DW]    Clinical Course User Index [DW] Midge Golas, MD                                 Medical Decision Making Amount and/or Complexity of Data Reviewed Labs: ordered. Radiology: ordered.  Risk Prescription drug management. Decision regarding hospitalization.   This patient presents to the ED for concern of shortness of breath, this involves an extensive number of treatment options, and is a complaint that carries with it a high risk of complications and morbidity.  The differential diagnosis includes but is not limited to Acute coronary syndrome, pneumonia, acute pulmonary edema, pneumothorax, acute anemia, pulmonary embolism   Comorbidities that complicate the patient evaluation: Patient's presentation is complicated by their history of CHF, prostate CA  Social Determinants of Health: Patient's previous tobacco use  increases the complexity of managing their presentation  Additional history obtained: Additional history obtained from spouse Records reviewed previous admission documents  Lab Tests: I Ordered, and personally interpreted labs.  The pertinent results include: Hyperglycemia, elevated BNP  Imaging Studies ordered: I ordered imaging studies including X-ray chest  I independently  visualized and interpreted imaging which showed early signs of CHF I agree with the radiologist interpretation  Cardiac Monitoring: The patient was maintained on a cardiac monitor.  I personally viewed and interpreted the cardiac monitor which showed an underlying rhythm of:  Paced rhythm  Medicines ordered and prescription drug management: I ordered medication including nitroglycerin  and Lasix  for presumed pulmonary edema Reevaluation of the patient after these  medicines showed that the patient    stayed the same  Critical Interventions:   admission, treatment for CHF  Consultations Obtained: I requested consultation with the admitting physician [facility], and discussed  findings as well as pertinent plan - they recommend: Admit  Reevaluation: After the interventions noted above, I reevaluated the patient and found that they have :stayed the same  Complexity of problems addressed: Patient's presentation is most consistent with  acute presentation with potential threat to life or bodily function  Disposition: After consideration of the diagnostic results and the patient's response to treatment,  I feel that the patent would benefit from admission  .        Final diagnoses:  Acute systolic congestive heart failure [facility])    ED Discharge Orders     None          Midge Golas, MD 12/05/23 [phone]

## 2023-12-06 ENCOUNTER — Ambulatory Visit: Admitting: Radiation Oncology

## 2023-12-06 DIAGNOSIS — I5023 Acute on chronic systolic (congestive) heart failure: Secondary | ICD-10-CM

## 2023-12-06 LAB — GLUCOSE, CAPILLARY
Glucose-Capillary: 196 mg/dL — ABNORMAL HIGH (ref 70–99)
Glucose-Capillary: 170 mg/dL — ABNORMAL HIGH (ref 70–99)
Glucose-Capillary: 191 mg/dL — ABNORMAL HIGH (ref 70–99)

## 2023-12-06 LAB — BASIC METABOLIC PANEL WITH GFR
Anion gap: 10 (ref 5–15)
BUN: 29 mg/dL — ABNORMAL HIGH (ref 8–23)
CO2: 24 mmol/L (ref 22–32)
Calcium: 8.9 mg/dL (ref 8.9–10.3)
Chloride: 100 mmol/L (ref 98–111)
Creatinine, Ser: 1.34 mg/dL — ABNORMAL HIGH (ref 0.61–1.24)
GFR, Estimated: 58 mL/min — ABNORMAL LOW (ref >60)
Glucose, Bld: 181 mg/dL — ABNORMAL HIGH (ref 70–99)
Potassium: 3.9 mmol/L (ref 3.5–5.1)
Sodium: 134 mmol/L — ABNORMAL LOW (ref 135–145)

## 2023-12-06 LAB — MRSA NEXT GEN BY PCR, NASAL: MRSA by PCR Next Gen: NOT DETECTED

## 2023-12-06 LAB — CBC
HCT: 34.7 % — ABNORMAL LOW (ref 39.0–52.0)
Hemoglobin: 11 g/dL — ABNORMAL LOW (ref 13.0–17.0)
MCH: 29.7 pg (ref 26.0–34.0)
MCHC: 31.7 g/dL (ref 30.0–36.0)
MCV: 93.8 fL (ref 80.0–100.0)
Platelets: 167 K/uL (ref 150–400)
RBC: 3.7 MIL/uL — ABNORMAL LOW (ref 4.22–5.81)
RDW: 13.5 % (ref 11.5–15.5)
WBC: 8 K/uL (ref 4.0–10.5)
nRBC: 0 % (ref 0.0–0.2)

## 2023-12-06 LAB — HIV ANTIBODY (ROUTINE TESTING W REFLEX): HIV Screen 4th Generation wRfx: NONREACTIVE

## 2023-12-06 LAB — MAGNESIUM: Magnesium: 1.7 mg/dL (ref 1.7–2.4)

## 2023-12-06 LAB — CBG MONITORING, ED: Glucose-Capillary: 146 mg/dL — ABNORMAL HIGH (ref 70–99)

## 2023-12-06 NOTE — ED Notes (Signed)
 Pt desat to 86% while sleeping on RA, pt states he typically wears cpap at home, but does not have home machine with him. Pt placed on 3L [place] while sleeping and RT called for cpap.

## 2023-12-06 NOTE — ED Notes (Signed)
Emptied patient's urinal.

## 2023-12-06 NOTE — ED Notes (Signed)
 Breathing is even and unlabored.  Call light within reach, encouraged to use when needs arise.  PT able to take multiple pills at one time. Fall risk bracelet on.

## 2023-12-06 NOTE — Progress Notes (Signed)
Placed patient on CPAP for the night via auto-mode.  

## 2023-12-06 NOTE — Progress Notes (Signed)
   12/06/23 1545  TOC Brief Assessment  Insurance and Status Reviewed  Patient has primary care physician Yes  Home environment has been reviewed Home w/ spouse  Prior level of function: Independent  Prior/Current Home Services Current home services (O2 2L nasal cannula at night)  Social Drivers of Health Review SDOH reviewed no interventions necessary  Readmission risk has been reviewed Yes  Transition of care needs no transition of care needs at this time

## 2023-12-06 NOTE — Progress Notes (Signed)
 [facility] Progress Note Patient: Martin Bush FMW:[id number] DOB: 1955/02/02  DOA: 12/05/2023 DOS: the patient was seen and examined on 12/06/2023  Brief Hospital Course: Martin Bush is a 68 y.o. male with medical history significant of CAD s/p CABG x2 in 2002 (only LIMA to LAD being patent) and PCI (left main into superdominant LCx on 10/09/2019), ischemic cardiomyopathy, HFrEF (EF 35-40% in 08/2023), recurrent sustained VT s/p ICD implantation, hypertension, mixed hyperlipidemia, controlled diabetes mellitus, stage IIIa chronic kidney disease and bilateral carotid artery stenting (left TCAR on 05/26/2020 and a right TCAR on 07/07/2020) p/w SOB/DOE, BLE edema, and elevated BNP c/w HFrEF exacerbation.  Currently receiving IV diuresis.   Assessment and Plan: Acute on chronic HFrEF Echocardiogram 8/25 with reduced LV systolic function EF 35 to 40%, global hypokinesis, akinesis of the left ventricular, and apical segment. RV systolic function preserved, LA with moderate dilatation, no significant valvular disease.  Patient was placed on IV furosemide  for diuresis.  Continue Entresto .  CKD stage 3a, Currently receiving IV diuresis. Monitor renal function. Baseline serum creatinine appears to be around 1.2 currently serum creatinine 1.3.     History of ventricular tachycardia S/p AICD Cont amiodarone    Type 2 diabetes mellitus with hyperlipidemia (HCC) Patient was placed on insulin  sliding scale and 10 units of basal insulin  with adequate glucose control.  At the time of discharge he will resume his home insulin  regimen.  Continue with statin therapy.    HTN (hypertension), benign Continue blood pressure control with carvedilol  and entresto  Holding amlodipine    Left main coronary artery disease No chest pain or anginal, continue antiplatelet ( aspirin ) and statin.    Class 1 obesity Body mass index is 33.35 kg/m.   Prostate cancer. Recent underwent cystoscopy and  radioactive seed placement on 10/25. He thinks that he may have difficulty urinating after the procedure due to swelling of his prostate. Currently improving.  Denies any bladder retention. Continue Flomax.  Subjective: Breathing better.  Continues to have shortness of breath but no chest pain.  Physical Exam: General: in Mild distress, No Rash Cardiovascular: S1 and S2 Present, No Murmur Respiratory: Good respiratory effort, Bilateral Air entry present.  Basal crackles, No wheezes Abdomen: Bowel Sound present, No tenderness Extremities: bilateral edema Neuro: Alert and oriented x3, no new focal deficit   Data Reviewed: I have Reviewed nursing notes, Vitals, and Lab results. Since last encounter, pertinent lab results CBC BMP   . I have ordered test including CBC BMP  .   Disposition: Status is: Inpatient Remains inpatient appropriate because: Monitor for improvement in volume status  enoxaparin  (LOVENOX ) injection 40 mg Start: 12/05/23 2200 SCDs Start: 12/05/23 1000   Family Communication: No one at bedside Level of care: Telemetry   Vitals:   12/06/23 1204 12/06/23 1245 12/06/23 1304 12/06/23 1601  BP: 134/68 134/68 128/63 (!) 127/58  Pulse: (!) 57 (!) 58 60 64  Resp: 18 18 17 19   Temp: 97.7 F (36.5 C) 97.8 F (36.6 C) 98.4 F (36.9 C) 98.4 F (36.9 C)  TempSrc: Oral Oral Oral Oral  SpO2: 99% 99% 95% 95%  Weight:   96.6 kg   Height:   5' 7 (1.702 m)      Author: Yetta Blanch, MD 12/06/2023 5:51 PM  Please look on [email or web address] to find out who is on call.

## 2023-12-06 NOTE — Progress Notes (Signed)
 [facility]   Bed Availability No  Level of Care Needed:  No  MD Agree to transfer: No  Patient agree to transfer: No  Sharolyn Batman, RN [facility]

## 2023-12-06 NOTE — ED Notes (Signed)
 PT sitting on side of bed. Pt updated on room.

## 2023-12-06 NOTE — Plan of Care (Signed)

## 2023-12-06 NOTE — Plan of Care (Signed)
  Problem: Nutritional: Goal: Maintenance of adequate nutrition will improve Outcome: Progressing   Problem: Education: Goal: Knowledge of General Education information will improve Description: Including pain rating scale, medication(s)/side effects and non-pharmacologic comfort measures Outcome: Progressing   Problem: Pain Managment: Goal: General experience of comfort will improve and/or be controlled Outcome: Progressing   Problem: Safety: Goal: Ability to remain free from injury will improve Outcome: Progressing   Problem: Skin Integrity: Goal: Risk for impaired skin integrity will decrease Outcome: Progressing

## 2023-12-07 ENCOUNTER — Encounter: Payer: Self-pay | Admitting: Cardiology

## 2023-12-07 ENCOUNTER — Telehealth: Payer: Self-pay | Admitting: Cardiology

## 2023-12-07 ENCOUNTER — Ambulatory Visit: Attending: Radiation Oncology | Admitting: Radiation Oncology

## 2023-12-07 DIAGNOSIS — I5023 Acute on chronic systolic (congestive) heart failure: Secondary | ICD-10-CM | POA: Diagnosis not present

## 2023-12-07 DIAGNOSIS — R7989 Other specified abnormal findings of blood chemistry: Secondary | ICD-10-CM | POA: Diagnosis not present

## 2023-12-07 LAB — BASIC METABOLIC PANEL WITH GFR
Anion gap: 12 (ref 5–15)
BUN: 34 mg/dL — ABNORMAL HIGH (ref 8–23)
CO2: 27 mmol/L (ref 22–32)
Calcium: 8.6 mg/dL — ABNORMAL LOW (ref 8.9–10.3)
Chloride: 96 mmol/L — ABNORMAL LOW (ref 98–111)
Creatinine, Ser: 1.65 mg/dL — ABNORMAL HIGH (ref 0.61–1.24)
GFR, Estimated: 45 mL/min — ABNORMAL LOW (ref >60)
Glucose, Bld: 181 mg/dL — ABNORMAL HIGH (ref 70–99)
Potassium: 3.6 mmol/L (ref 3.5–5.1)
Sodium: 135 mmol/L (ref 135–145)

## 2023-12-07 LAB — CBC
HCT: 30.7 % — ABNORMAL LOW (ref 39.0–52.0)
Hemoglobin: 10.1 g/dL — ABNORMAL LOW (ref 13.0–17.0)
MCH: 30.1 pg (ref 26.0–34.0)
MCHC: 32.9 g/dL (ref 30.0–36.0)
MCV: 91.4 fL (ref 80.0–100.0)
Platelets: 153 K/uL (ref 150–400)
RBC: 3.36 MIL/uL — ABNORMAL LOW (ref 4.22–5.81)
RDW: 13.4 % (ref 11.5–15.5)
WBC: 7.2 K/uL (ref 4.0–10.5)
nRBC: 0 % (ref 0.0–0.2)

## 2023-12-07 LAB — GLUCOSE, CAPILLARY
Glucose-Capillary: 158 mg/dL — ABNORMAL HIGH (ref 70–99)
Glucose-Capillary: 195 mg/dL — ABNORMAL HIGH (ref 70–99)
Glucose-Capillary: 139 mg/dL — ABNORMAL HIGH (ref 70–99)
Glucose-Capillary: 134 mg/dL — ABNORMAL HIGH (ref 70–99)

## 2023-12-07 LAB — MAGNESIUM: Magnesium: 1.6 mg/dL — ABNORMAL LOW (ref 1.7–2.4)

## 2023-12-07 MED ORDER — MAGNESIUM SULFATE 2 GM/50ML IV SOLN
2.0000 g | Freq: Once | INTRAVENOUS | Status: AC
  Administered 2023-12-07: 2 g via INTRAVENOUS
  Filled 2023-12-07: qty 50

## 2023-12-07 NOTE — Consult Note (Addendum)
 Cardiology Consultation   Patient ID: Stony Stegmann MRN: [id number]; DOB: 1955-10-11  Admit date: 12/05/2023 Date of Consult: 12/07/2023  PCP:  [facility]   [facility] Providers Cardiologist:  Gordy Bergamo, MD  Electrophysiologist:  Fonda Kitty, MD     Patient Profile: Martin Bush is a 68 y.o. male with a hx of hypertension, hyperlipidemia, type 2 diabetes, stage IIIa CKD, prostate cancer, carotid artery stenosis left TCAR on 05/2020, right TCAR on 06/2020, HFrEF, recurrent sustained VT s/p ICD, and CAD s/p CABG x 2 in 2002 with LIMA to LAD who is being seen 12/07/2023 for the evaluation of CHF at the request of Tobie Jericho MD.   History of Present Illness: Martin Bush is a 68 year old male with prior cardiac history listed below.   Has a history of CAD in 2002 received CABG x 2 with LIMA to the LAD.  The patient had a cardiac catheterization done on 11/2019 and showed patent LM/LCx stent, 100% occlusion in the proximal LAD, LIMA to LAD patent, 50% stenosis in mid to distal LAD, 60% stenosis in the third OM, and severe diffuse disease in the RCA.  The patient was managed medically.  There was no change from prior cath on 09/2019.  The patient received an ICD for recurrent sustained VT. An ICD generator change out was done on 02/2023.  The patient was last seen for outpatient follow-up by Dr. Bergamo on 07/2023.  The patient denied any anginal symptoms at that time.  Patient presented to the emergency department on 12/05/2023 for chest pain and shortness of breath.  Patient was diuresed using IV Lasix .  On interview patient reported that he came to the emergency department for shortness of breath, lower extremity edema, and orthopnea that started on Sunday.  Has also had some chest discomfort.  He is unable to do more than 4 metabolic equivalents of exertion this is primarily because he gets back pain when he is standing up.  Denies any recent change in  functional status.  The patient reported that his shortness of breath, lower extremity edema, and orthopnea has improved significantly.  Stated that his chest pain has also resolved.  His home baseline weight is 210 pounds. Denies nicotine use, alcohol use, illicit substance use.  Labs showed high-sensitivity troponins 39 > 84, elevated BNP of 1327, potassium of 3.6, creatinine of 1.65 baseline is about 1.3, BUN of 34, decreased calcium  of 8.6, anemia with a hemoglobin of 10.1, WBC count of 7.2  Echo on 08/2023 showed a reduced LVEF of 35 to 40%, global hypokinesis, mild concentric LVH, mildly reduced RV systolic function, mildly dilated left atrium, aortic valve sclerosis with no stenosis.  Chest x-ray showed No acute cardiopulmonary abnormality.   EKG showed a sensed V paced rhythm with a rate of 98.  Nonspecific IVCD.  Past Medical History:  Diagnosis Date   Arthritis    thumbs  (08/02/2017)   CHF (congestive heart failure) (HCC)    Chronic combined systolic and diastolic heart failure (HCC)    a. 08/2017 Echo: EF 20-25%, mod glob HK. Sev distal ant sept, inflat HK. Apical AK. Gr2 DD. Mildly reduced RV fxn.   Chronic kidney disease    III, stage 3   Colon polyps    Coronary artery disease    Depression    Diabetes mellitus without complication (HCC)    Dysrhythmia    hx of V. tach   Elevated PSA    History of hiatal hernia  Hyperlipidemia    Hypertension    ICD; Biventricular  Medtronic ICD Amplia MRI QWuad CRT-D  in situ 10/29/14 10/29/2014   Ischemic cardiomyopathy 09/27/2018   Neuromuscular disorder (HCC)    Sciatica, right side   NSTEMI (non-ST elevated myocardial infarction) (HCC) 07/16/2014   OSA on CPAP    Peripheral vascular disease    Pneumonia    Prostate cancer (HCC) 07/25/2023   [facility]   Proteinuria     Past Surgical History:  Procedure Laterality Date   BIOPSY  09/19/2018   Procedure: BIOPSY;  Surgeon: Eda Iha, MD;  Location: [facility] ENDOSCOPY;  Service:  Gastroenterology;;   CARDIAC CATHETERIZATION N/A 07/16/2014   Procedure: Left Heart Cath and Coronary Angiography;  Surgeon: Gordy Bergamo, MD;  Location: [facility] INVASIVE CV LAB;  Service: Cardiovascular;  Laterality: N/A;   CARDIAC CATHETERIZATION  07/16/2014   Procedure: Coronary Balloon Angioplasty;  Surgeon: Gordy Bergamo, MD;  Location: [facility] INVASIVE CV LAB;  Service: Cardiovascular;;   CARDIAC CATHETERIZATION  2003   CARDIAC DEFIBRILLATOR PLACEMENT  2016   CATARACT EXTRACTION Left 2018   CATARACT EXTRACTION W/ INTRAOCULAR LENS IMPLANT Left 03/2014   COLONOSCOPY     COLONOSCOPY WITH PROPOFOL  N/A 09/19/2018   Procedure: COLONOSCOPY WITH PROPOFOL ;  Surgeon: Eda Iha, MD;  Location: [facility];  Service: Gastroenterology;  Laterality: N/A;   COLONOSCOPY WITH PROPOFOL  N/A 05/12/2023   Procedure: COLONOSCOPY WITH PROPOFOL ;  Surgeon: Mansouraty, Aloha Raddle., MD;  Location: [facility];  Service: Gastroenterology;  Laterality: N/A;   CORONARY ANGIOPLASTY WITH STENT PLACEMENT     I've got a total of 8 stents in there; mostly doine at [facility] (08/02/2017)   CORONARY ARTERY BYPASS GRAFT  ~ 2003   CABG X2; [facility]   CORONARY ATHERECTOMY N/A 08/16/2017   Procedure: CORONARY ATHERECTOMY;  Surgeon: Elmira Newman PARAS, MD;  Location: [facility] INVASIVE CV LAB;  Service: Cardiovascular;  Laterality: N/A;   CORONARY BALLOON ANGIOPLASTY N/A 08/04/2017   Procedure: CORONARY BALLOON ANGIOPLASTY;  Surgeon: Bergamo Gordy, MD;  Location: [facility] INVASIVE CV LAB;  Service: Cardiovascular;  Laterality: N/A;   CORONARY BALLOON ANGIOPLASTY N/A 01/11/2019   Procedure: CORONARY BALLOON ANGIOPLASTY;  Surgeon: Bergamo Gordy, MD;  Location: [facility] INVASIVE CV LAB;  Service: Cardiovascular;  Laterality: N/A;   CORONARY BALLOON ANGIOPLASTY N/A 10/09/2019   Procedure: CORONARY BALLOON ANGIOPLASTY;  Surgeon: Bergamo Gordy, MD;  [facility]: [facility] INVASIVE CV LAB;  Service: Cardiovascular;  Laterality: N/A;   CORONARY STENT INTERVENTION  N/A 08/16/2017   Procedure: CORONARY STENT INTERVENTION;  Surgeon: Elmira Newman PARAS, MD;  [facility] INVASIVE CV LAB;  Service: Cardiovascular;  Laterality: N/A;   CORONARY STENT INTERVENTION N/A 10/09/2019   Procedure: CORONARY STENT INTERVENTION;  Surgeon: Bergamo Gordy, MD;  [facility] INVASIVE CV LAB;  Service: Cardiovascular;  Laterality: N/A;   ELBOW SURGERY Left ?2001   pinched nerve   ENDOSCOPIC MUCOSAL RESECTION  12/18/2018   Procedure: ENDOSCOPIC MUCOSAL RESECTION;  Surgeon: Wilhelmenia Aloha Raddle., MD;  Location: [facility] ENDOSCOPY;  Service: Gastroenterology;;   EYE SURGERY Bilateral    cataract removal   FLEXIBLE SIGMOIDOSCOPY N/A 12/18/2018   Procedure: FLEXIBLE SIGMOIDOSCOPY;  Surgeon: Wilhelmenia Aloha Raddle., MD;  Location: [facility] ENDOSCOPY;  Service: Gastroenterology;  Laterality: N/A;   HEMOSTASIS CLIP PLACEMENT  12/18/2018   Procedure: HEMOSTASIS CLIP PLACEMENT;  Surgeon: Wilhelmenia Aloha Raddle., MD;  Location: [facility] ENDOSCOPY;  Service: Gastroenterology;;   ICD GENERATOR CHANGEOUT N/A 03/08/2023   Procedure: ICD GENERATOR CHANGEOUT;  Surgeon: Kennyth Chew, MD;  Location: [facility] INVASIVE  CV LAB;  Service: Cardiovascular;  Laterality: N/A;   LEFT HEART CATH AND CORONARY ANGIOGRAPHY N/A 01/11/2019   Procedure: LEFT HEART CATH AND CORONARY ANGIOGRAPHY;  Surgeon: Ladona Heinz, MD;  [facility]VAS[facility] CV LAB;  Service: Cardiovascular;  Laterality: N/A;   LEFT HEART CATH AND CORS/GRAFTS ANGIOGRAPHY N/A 08/04/2017   Procedure: LEFT HEART CATH AND CORS/GRAFTS ANGIOGRAPHY;  Surgeon: Ladona Heinz, MD;  [facility] INVASIVE CV LAB;  Service: Cardiovascular;  Laterality: N/A;   LEFT HEART CATH AND CORS/GRAFTS ANGIOGRAPHY N/A 08/20/2017   Procedure: LEFT HEART CATH AND CORS/GRAFTS ANGIOGRAPHY;  Surgeon: Elmira Newman PARAS, MD;  [facility]VAS[facility] CV LAB;  Service: Cardiovascular;  Laterality: N/A;   LEFT HEART CATH AND CORS/GRAFTS ANGIOGRAPHY N/A 01/08/2019   Procedure: LEFT HEART CATH AND  CORS/GRAFTS ANGIOGRAPHY;  Surgeon: Darron Deatrice LABOR, MD;  Location: [facility] INVASIVE CV LAB;  Service: Cardiovascular;  Laterality: N/A;   LEFT HEART CATH AND CORS/GRAFTS ANGIOGRAPHY N/A 10/09/2019   Procedure: LEFT HEART CATH AND CORS/GRAFTS ANGIOGRAPHY;  Surgeon: Ladona Heinz, MD;  [facility] CV LAB;  Service: Cardiovascular;  Laterality: N/A;   LEFT HEART CATH AND CORS/GRAFTS ANGIOGRAPHY N/A 12/11/2019   Procedure: LEFT HEART CATH AND CORS/GRAFTS ANGIOGRAPHY;  Surgeon: Elmira Newman PARAS, MD;  [facility]VAS[facility] CV LAB;  Service: Cardiovascular;  Laterality: N/A;   POLYPECTOMY  09/19/2018   Procedure: POLYPECTOMY;  Surgeon: Eda Iha, MD;  Location: [facility];  Service: Gastroenterology;;   POLYPECTOMY  05/12/2023   Procedure: POLYPECTOMY, INTESTINE;  Surgeon: Wilhelmenia Aloha Raddle., MD;  Location: [facility];  Service: Gastroenterology;;   PROSTATE BIOPSY     RADIOACTIVE SEED IMPLANT N/A 11/24/2023   Procedure: INSERTION, RADIATION SOURCE, PROSTATE;  Surgeon: Renda Glance, MD;  Location: [facility] ORS;  Service: Urology;  Laterality: N/A;   SPACE OAR INSTILLATION N/A 11/24/2023   Procedure: INJECTION, HYDROGEL SPACER;  Surgeon: Renda Glance, MD;  Location: [facility] ORS;  Service: Urology;  Laterality: N/A;   SUBMUCOSAL INJECTION  09/19/2018   Procedure: SUBMUCOSAL INJECTION;  Surgeon: Eda Iha, MD;  Location: [facility];  Service: Gastroenterology;;   ROBLEY LIFTING INJECTION  12/18/2018   Procedure: SUBMUCOSAL LIFTING INJECTION;  Surgeon: Wilhelmenia Aloha Raddle., MD;  Location: [facility] ENDOSCOPY;  Service: Gastroenterology;;   TRANSCAROTID ARTERY REVASCULARIZATION  Left 05/26/2020   Procedure: LEFT TRANSCAROTID ARTERY REVASCULARIZATION;  Surgeon: Gretta Lonni PARAS, MD;  Location: [facility] OR;  Service: Vascular;  Laterality: Left;   TRANSCAROTID ARTERY REVASCULARIZATION  Right 07/07/2020   Procedure: RIGHT TRANSCAROTID ARTERY REVASCULARIZATION;  Surgeon: Gretta Lonni PARAS, MD;  Location: [facility] OR;  Service: Vascular;  Laterality: Right;   ULTRASOUND GUIDANCE FOR VASCULAR ACCESS Right 05/26/2020   Procedure: ULTRASOUND GUIDANCE FOR VASCULAR ACCESS;  Surgeon: Gretta Lonni PARAS, MD;  Location: [facility] OR;  Service: Vascular;  Laterality: Right;   ULTRASOUND GUIDANCE FOR VASCULAR ACCESS Left 07/07/2020   Procedure: ULTRASOUND GUIDANCE FOR VASCULAR ACCESS;  Surgeon: Gretta Lonni PARAS, MD;  Location: [facility] OR;  Service: Vascular;  Laterality: Left;     Home Medications:  Prior to Admission medications   Medication Sig Start Date End Date Taking? Authorizing Provider  amiodarone  (PACERONE ) 200 MG tablet Take 0.5 tablets (100 mg total) by mouth daily. 10/07/20  Yes Ladona Heinz, MD  amLODipine  (NORVASC ) 5 MG tablet Take 5 mg by mouth daily.   Yes [provider]  aspirin  EC 81 MG EC tablet Take 1 tablet (81 mg total) by mouth daily. 01/08/19  Yes Dickie Begun, MD  calcium  carbonate (OS-CAL)  1250 (500 Ca) MG chewable tablet Chew 1 tablet by mouth daily.   Yes [provider]  carvedilol  (COREG ) 25 MG tablet Take 1 tablet (25 mg total) by mouth 2 (two) times daily with a meal. 10/26/21  Yes Ladona Heinz, MD  Cholecalciferol  (VITAMIN D3) 50 MCG (2000 UT) TABS Take 2,000 Units by mouth 2 (two) times daily.    Yes [provider]  empagliflozin  (JARDIANCE ) 25 MG TABS tablet Take 25 mg by mouth daily.   Yes [provider]  ezetimibe  (ZETIA ) 10 MG tablet Take 1 tablet (10 mg total) by mouth daily. 08/03/23 12/04/24 Yes Ladona Heinz, MD  ferrous sulfate  325 (65 FE) MG tablet Take 325 mg by mouth every Monday, Wednesday, and Friday. In the morning 11/06/21  Yes [provider]  furosemide  (LASIX ) 20 MG tablet Take 20 mg by mouth daily.   Yes [provider]  insulin  isophane & regular human KwikPen (NOVOLIN 70/30 KWIKPEN) (70-30) 100 UNIT/ML KwikPen Inject 24 Units into the skin daily as needed (for blood glucose 180 or  greater).   Yes [provider]  metFORMIN  (GLUCOPHAGE ) 500 MG tablet Take 500 mg by mouth 2 (two) times daily with a meal.   Yes [provider]  nitroGLYCERIN  (NITROSTAT ) 0.4 MG SL tablet Place 1 tablet (0.4 mg total) under the tongue every 5 (five) minutes x 3 doses as needed for chest pain. 06/03/22  Yes Ladona Heinz, MD  OXYGEN  Inhale 2 L/min into the lungs See admin instructions. 2 L/min at bedtime in conjunction with CPAP    Yes [provider]  PRESCRIPTION MEDICATION Inhale into the lungs See admin instructions. CPAP- At bedtime   Yes [provider]  relugolix (ORGOVYX) 120 MG tablet Take 120 mg by mouth daily.   Yes [provider]  rosuvastatin  (CRESTOR ) 40 MG tablet Take 1 tablet (40 mg total) by mouth at bedtime. 01/08/19  Yes Dickie Begun, MD  sacubitril -valsartan  (ENTRESTO ) 49-51 MG Take 1 tablet by mouth 2 (two) times daily. 11/26/21  Yes Arrien, Elidia Sieving, MD  sertraline  (ZOLOFT ) 50 MG tablet Take 50 mg by mouth at bedtime.   Yes [provider]  tamsulosin (FLOMAX) 0.4 MG CAPS capsule Take 1 capsule (0.4 mg total) by mouth at bedtime. 11/24/23  Yes Renda Glance, MD  tirzepatide [facility]) 7.5 MG/0.5ML Pen Inject 7.5 mg into the skin once a week.   Yes [provider]  traMADol (ULTRAM) 50 MG tablet Take 1-2 tablets (50-100 mg total) by mouth every 6 (six) hours as needed (pain). 11/24/23  Yes Renda Glance, MD    Scheduled Meds:  amiodarone   100 mg Oral Daily   aspirin  EC  81 mg Oral Daily   carvedilol   25 mg Oral BID WC   enoxaparin  (LOVENOX ) injection  40 mg Subcutaneous Q24H   insulin  aspart  0-15 Units Subcutaneous TID WC   insulin  glargine-yfgn  20 Units Subcutaneous Daily   rosuvastatin   40 mg Oral QHS   sacubitril -valsartan   1 tablet Oral BID   sertraline   50 mg Oral QHS   tamsulosin  0.4 mg Oral QHS   Continuous Infusions:  magnesium  sulfate bolus IVPB     PRN Meds:   Allergies:    Allergies   Allergen Reactions   Diltiazem Rash    Social History:   Social History   Socioeconomic History   Marital status: Married    Spouse name: Not on file   Number of children: 3  Years of education: Not on file   Highest education level: Not on file  Occupational History   Occupation: retired  Tobacco Use   Smoking status: Former    Current packs/day: 0.00    Average packs/day: 0.3 packs/day for 27.0 years (6.8 ttl pk-yrs)    Types: Cigars, Cigarettes    Start date: 8    Quit date: 2002    Years since quitting: 23.8   Smokeless tobacco: Former    Types: Chew    Quit date: 2002  Vaping Use   Vaping status: Never Used  Substance and Sexual Activity   Alcohol use: Not Currently   Drug use: Never   Sexual activity: Not Currently  Other Topics Concern   Not on file  Social History Narrative   Not on file   Social Drivers of Health   Financial Resource Strain: Low Risk  (12/05/2020)   Overall Financial Resource Strain (CARDIA)    Difficulty of Paying Living Expenses: Not hard at all  Food Insecurity: No Food Insecurity (12/06/2023)   Hunger Vital Sign    Worried About Running Out of Food in the Last Year: Never true    Ran Out of Food in the Last Year: Never true  Transportation Needs: No Transportation Needs (12/06/2023)   PRAPARE - Administrator, Civil Service (Medical): No    Lack of Transportation (Non-Medical): No  Physical Activity: Unknown (04/25/2017)   Exercise Vital Sign    Days of Exercise per Week: 0 days    Minutes of Exercise per Session: Not on file  Stress: No Stress Concern Present (12/05/2020)   [employer] of Occupational Health - Occupational Stress Questionnaire    Feeling of Stress : Not at all  Social Connections: Moderately Isolated (12/06/2023)   Social Connection and Isolation Panel    Frequency of Communication with Friends and Family: More than three times a week    Frequency of Social Gatherings with Friends and  Family: Twice a week    Attends Religious Services: Never    Database Administrator or Organizations: No    Attends Banker Meetings: Never    Marital Status: Married  Catering Manager Violence: Not At Risk (12/06/2023)   Humiliation, Afraid, Rape, and Kick questionnaire    Fear of Current or Ex-Partner: No    Emotionally Abused: No    Physically Abused: No    Sexually Abused: No    Family History:    Family History  Problem Relation Age of Onset   Uterine cancer Mother    Lung cancer Mother    Hyperlipidemia Father    Heart disease Father    Hypertension Father    Diabetes Father    Hypertension Sister    Hypertension Brother    Colon cancer Neg Hx      ROS:  Please see the history of present illness.   All other ROS reviewed and negative.     Physical Exam/Data: Vitals:   12/06/23 2316 12/07/23 0418 12/07/23 0738 12/07/23 1156  BP: (!) 122/59 (!) 124/53 118/65 126/66  Pulse: 63 62 60 63  Resp: 20 16 19 17   Temp: 98.2 F (36.8 C) 98.4 F (36.9 C) 97.8 F (36.6 C) 98 F (36.7 C)  TempSrc: Oral Oral Oral Oral  SpO2: 93% 96% 94% 95%  Weight:      Height:        Intake/Output Summary (Last 24 hours) at 12/07/2023 1416 Last data filed at 12/07/2023  9261 Gross per 24 hour  Intake 600 ml  Output 2420 ml  Net -1820 ml      12/06/2023    1:04 PM 12/05/2023    3:41 AM 11/24/2023    9:59 AM  Last 3 Weights  Weight (lbs) 212 lb 15.4 oz 218 lb 215 lb  Weight (kg) 96.6 kg 98.884 kg 97.523 kg     Body mass index is 33.35 kg/m.  General:  Well nourished, well developed, in no acute distress.  Alert and orientated on room air.  Patient was comfortable laying flat in bed. HEENT: normal Neck: no JVD Vascular: No carotid bruits; Distal pulses 2+ bilaterally Cardiac:  normal S1, S2; RRR; no murmur  Lungs:  clear to auscultation bilaterally, no wheezing, rhonchi or rales  Abd: Distended, nontender to palpation, no hepatomegaly. Ext: no  edema Musculoskeletal:  No deformities. Skin: warm and dry  Neuro:  no focal abnormalities noted Psych:  Normal affect   EKG:  The EKG was personally reviewed and demonstrates:   Telemetry:  Telemetry was personally reviewed and demonstrates: Is mostly a sensed V paced rhythm with resting heart rates in the 50s to 70s.  Also does have some episodes of a paced V paced rhythm.  Relevant CV Studies:   Laboratory Data: High Sensitivity Troponin:   Recent Labs  Lab 12/05/23 0344 12/05/23 0540  TROPONINIHS 39* 84*     Chemistry Recent Labs  Lab 12/05/23 0344 12/06/23 0848 12/07/23 0222  NA 136 134* 135  K 4.0 3.9 3.6  CL 103 100 96*  CO2 24 24 27   GLUCOSE 379* 181* 181*  BUN 23 29* 34*  CREATININE 1.39* 1.34* 1.65*  CALCIUM  8.8* 8.9 8.6*  MG  --  1.7 1.6*  GFRNONAA 56* 58* 45*  ANIONGAP 9 10 12     No results for input(s): PROT, ALBUMIN, AST, ALT, ALKPHOS, BILITOT in the last 168 hours. Lipids No results for input(s): CHOL, TRIG, HDL, LABVLDL, LDLCALC, CHOLHDL in the last 168 hours.  Hematology Recent Labs  Lab 12/05/23 0344 12/06/23 0848 12/07/23 0222  WBC 11.4* 8.0 7.2  RBC 3.78* 3.70* 3.36*  HGB 11.4* 11.0* 10.1*  HCT 36.1* 34.7* 30.7*  MCV 95.5 93.8 91.4  MCH 30.2 29.7 30.1  MCHC 31.6 31.7 32.9  RDW 13.2 13.5 13.4  PLT 184 167 153   Thyroid  No results for input(s): TSH, FREET4 in the last 168 hours.  BNP Recent Labs  Lab 12/05/23 0344  BNP 1,327.3*    DDimer No results for input(s): DDIMER in the last 168 hours.  Radiology/Studies:  DG Chest 2 View Result Date: 12/05/2023 EXAM: 2 VIEW(S) XRAY OF THE CHEST 12/05/2023 04:11:00 AM COMPARISON: 11/24/2021 PET CT 08/31/2023 08/31/2023). CLINICAL HISTORY: 68 year old male with shortness of breath. FINDINGS: LINES, TUBES AND DEVICES: Left subclavian AICD in place. LUNGS AND PLEURA: Mildly improved lung volumes. 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 Regressed pulmonary vascularity compared to the prior x ray. No overt edema. No areas of worsening ventilation. No pneumothorax. HEART AND MEDIASTINUM: Aortic atherosclerosis. Chronic sternotomy, CABG, ACDF are stable. No acute abnormality of the cardiac and mediastinal silhouettes. BONES AND SOFT TISSUES: No acute osseous abnormality. IMPRESSION: 1. No acute cardiopulmonary abnormality. 2. 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 Electronically signed by: Helayne Hurst MD 12/05/2023 04:30 AM EST RP Workstation:  [id number]     Assessment and Plan:  Martin Bush is a 68 y.o. male with a hx of hypertension, hyperlipidemia, type 2 diabetes, stage IIIa CKD, prostate cancer, carotid artery stenosis left TCAR on 05/2020, right TCAR on 06/2020, HFrEF, recurrent sustained VT s/p ICD, and CAD s/p CABG x 2 in 2002 with LIMA to LAD who is being seen 12/07/2023 for the evaluation of CHF at the request of Tobie Jericho MD.   Acute on chronic systolic heart failure Echo on 08/2023 showed a reduced LVEF of 35 to 40%, global hypokinesis, mild concentric LVH, mildly reduced RV systolic function, mildly dilated left atrium, aortic valve sclerosis with no stenosis. Prior to admission the patient was on oral Lasix  20 mg daily.  Denies missing any doses of his Lasix . Patient reported that he eats out often.  This may have contributed to the patient's heart failure. Presented to the emergency department on 12/05/2023 for worsening shortness of breath, lower extremity edema, and orthopnea. Was admitted to the hospital and started on IV diuresis.  After the patient's creatinine worsened the patient's IV Lasix  was stopped.  elevated BNP of 1327 Chest x-ray showed No acute cardiopulmonary abnormality. Continue home Lasix  to 20 mg daily tomorrow.  May take an additional  dose if has worsening shortness of breath, 3 to 5 pound weight gain, or lower extremity edema. Has follow-up appointment with Thom on 01/04/24 GDMT Continue Coreg  25 mg twice daily Continue Entresto  49-51 twice daily Plan to restart Jardiance  after creatinine improves. May consider starting Aldactone in the future after creatinine is stable.   CAD s/p CABG x 2 in 2002 with LIMA to LAD Hyperlipidemia Continue aspirin  81 mg daily. Continue Crestor  40 mg daily. Continue Zetia  10 mg daily   carotid artery stenosis left TCAR on 05/2020, right TCAR on 06/2020 Management per primary   recurrent sustained VT s/p ICD Continue amiodarone  100 milligrams daily.   Prostate cancer Received prostate seeds recently.  Reported that he has had some difficulty urinating recently.   high-sensitivity troponins 39 > 84 Denies any chest pain or chest tightness. Suspect that this is likely from demand ischemia secondary to heart failure.   Otherwise management per primary       Risk Assessment/Risk Scores:     New York  Heart Association (NYHA) Functional Class NYHA Class II       For questions or updates, please contact [facility] Please consult [email or web address] for contact info under     Signed, Morse Clause, PA-C  12/07/2023 2:16 PM   Addie Gordy Kelly was seen by me today along with Zane Adams,PA-C. I have personally performed an evaluation on this patient.  My findings are as follows:  68 y.o. male with history of HTN, HLD, DM, CKD stage IIIa, prostate cancer, carotid artery disease, VT s/p ICD, ischemic cardiomyopathy, HFrEF, CAD s/p CABG admitted with dyspnea and LE edema and was found to be volume overloaded. He has been diuresed with IV Lasix  by the primary team and feels back to baseline. Mild elevation troponin in setting of CHF exacerbation but no chest pain while admitted. LVEF is 35-40% by echo in August 2025.   Data: EKG(s) and pertinent labs, studies,  etc were personally reviewed and interpreted by me:  I have personally reviewed the EKG: V paced I have personally reviewed the telemetry: V paced I have personally reviewed the lab data Otherwise, I agree with data as outlined by the advanced practice provider.  Exam performed by  me:  Gen: NAD Neck: No JVD Cardiac: RRR without a murmur Lungs: clear bilaterally Extremities: Both legs are wrapped and appear to have minimal edema  My Assessment and Plan:  Acute on chronic systolic CHF (HFrEF): He has diuresed well and now euvolemic. IV Lasix  has been stopped. Resume oral Lasix  tomorrow if he renal function is stable. Continue Coreg  and Entresto . Restart Jardiance . I have spoken to him about following daily weights at home and taking an extra 20 mg of Lasix  for 2-3 lb weight gain over 2 days.   Elevated troponin: Felt to be secondary to demand ischemia. NO plans for ischemic workup.   Signed,  Lonni Cash, MD  12/07/2023 4:07 PM

## 2023-12-07 NOTE — Progress Notes (Signed)
 Heart Failure Navigator Progress Note  Assessed for Heart & Vascular TOC clinic readiness.  Patient declined HF TOC stated he see's Dr. Ladona and just saw him in the office a few months ago. He would like to return to him vs coming for a HF TOC appointment. Notified Dr. Tobie about his request to speak with cardiology. Navigator did education on the sign and symptoms of heart failure, daily weights, when to call his doctor or go to the ED. Diet/ fluid restrictions, taking all medications as prescribed and attending all medical appointments. Patient and his wife verbalized their understanding of all education provided.    Navigator available for reassessment of patient.   Stephane Haddock, BSN, Scientist, Clinical (histocompatibility And Immunogenetics) Only

## 2023-12-07 NOTE — Telephone Encounter (Signed)
 Pt spouse wanted to inform Dr Ladona that pt is in the hospital and would like a c/b from the nurse to FU please advise

## 2023-12-07 NOTE — Progress Notes (Signed)
   Heart Failure Stewardship Pharmacist Progress Note   PCP: [facility] PCP-Cardiologist: Gordy Bergamo, MD    HPI:  68 yo M with PMH of CAD s/p CABG x 2 and PCI later, ICM, HFrEF, recurrent sustained VT s/p ICD, HTN, HLD, T2DM, CKD IIIa, TCAR, and prostate cancer.  Presented to the ED on 11/10 with chest pain, weight gain, shortness of breath, and orthopnea. CXR with no acute cardiopulmonary abnormality. BNP elevated. Started on IV lasix . Last EF from 08/2023 was 35-40%.  States that he still has some shortness of breath, mostly on exertion. Has some LE edema but does not like to wear compression stockings. Has been off Jardiance  recently because he was having issues getting the refills from the [place]. Otherwise, has tolerated medications well.   Current HF Medications: Beta Blocker: carvedilol  25 mg BID ACE/ARB/ARNI: Entresto  49/51 mg BID  Prior to admission HF Medications: Diuretic: furosemide  20 mg daily Beta blocker: carvedilol  25 mg BID ACE/ARB/ARNI: Entresto  49/51 mg BID SGLT2i: Jardiance  10 mg daily  Pertinent Lab Values: Serum creatinine 1.34>1.65, BUN 34, Potassium 3.6, Sodium 135, BNP 1327.3, Magnesium  1.6, A1c 8.7   Vital Signs: Weight: 212 lbs (admission weight: 218 lbs) Blood pressure: 120/60s  Heart rate: 50-60s  I/O: net -1.7L yesterday; net -4.6L since admission  Medication Assistance / Insurance Benefits Check: Does the patient have prescription insurance?  Yes Type of insurance plan: [facility]  Outpatient Pharmacy:  Prior to admission outpatient pharmacy: [facility] Is the patient willing to use [facility] TOC pharmacy at discharge? Yes Is the patient willing to transition their outpatient pharmacy to utilize a [facility] outpatient pharmacy?   No    Assessment: 1. Acute on chronic systolic CHF (LVEF 35-40%), due to ICM. NYHA class III symptoms. - Agree with stopping IV lasix , consider starting PO lasix  prior to discharge - Continue carvedilol  25 mg  BID - Continue Entresto  49/51 mg BID - Consider restarting Jardiance  10 mg daily and adding spironolactone pending creatinine trend   Plan: 1) Medication changes recommended at this time: - Restart Jardiance  10 mg daily tomorrow if creatinine improved  2) Patient assistance: - [facility] - Requesting refill of Jardiance  through [facility] pharmacy as he is out of this at home  3)  Education  - Patient has been educated on current HF medications and potential additions to HF medication regimen - Patient verbalizes understanding that over the next few months, these medication doses may change and more medications may be added to optimize HF regimen - Patient has been educated on basic disease state pathophysiology and goals of therapy   Duwaine Plant, PharmD, BCPS Heart Failure Engineer, Building Services Phone [phone]

## 2023-12-07 NOTE — Plan of Care (Signed)
  Problem: Education: Goal: Ability to describe self-care measures that may prevent or decrease complications (Diabetes Survival Skills Education) will improve Outcome: Progressing Goal: Individualized Educational Video(s) Outcome: Progressing   Problem: Coping: Goal: Ability to adjust to condition or change in health will improve Outcome: Progressing   Problem: Fluid Volume: Goal: Ability to maintain a balanced intake and output will improve Outcome: Progressing   Problem: Health Behavior/Discharge Planning: Goal: Ability to identify and utilize available resources and services will improve Outcome: Progressing Goal: Ability to manage health-related needs will improve Outcome: Progressing   Problem: Metabolic: Goal: Ability to maintain appropriate glucose levels will improve Outcome: Progressing   Problem: Nutritional: Goal: Maintenance of adequate nutrition will improve Outcome: Progressing Goal: Progress toward achieving an optimal weight will improve Outcome: Progressing   Problem: Skin Integrity: Goal: Risk for impaired skin integrity will decrease Outcome: Progressing   Problem: Tissue Perfusion: Goal: Adequacy of tissue perfusion will improve Outcome: Progressing   Problem: Education: Goal: Knowledge of General Education information will improve Description: Including pain rating scale, medication(s)/side effects and non-pharmacologic comfort measures Outcome: Progressing   Problem: Health Behavior/Discharge Planning: Goal: Ability to manage health-related needs will improve Outcome: Progressing   Problem: Clinical Measurements: Goal: Ability to maintain clinical measurements within normal limits will improve Outcome: Progressing Goal: Will remain free from infection Outcome: Progressing Goal: Diagnostic test results will improve Outcome: Progressing Goal: Respiratory complications will improve Outcome: Progressing Goal: Cardiovascular complication will  be avoided Outcome: Progressing   Problem: Activity: Goal: Risk for activity intolerance will decrease Outcome: Progressing   Problem: Nutrition: Goal: Adequate nutrition will be maintained Outcome: Progressing   Problem: Coping: Goal: Level of anxiety will decrease Outcome: Progressing   Problem: Elimination: Goal: Will not experience complications related to bowel motility Outcome: Progressing Goal: Will not experience complications related to urinary retention Outcome: Progressing

## 2023-12-07 NOTE — Progress Notes (Signed)
 Mobility Specialist Progress Note;   12/07/23 0957  Mobility  Activity Ambulated independently  Level of Assistance Standby assist, set-up cues, supervision of patient - no hands on  Assistive Device None  Distance Ambulated (ft) 300 ft  Activity Response Tolerated well  Mobility Referral Yes  Mobility visit 1 Mobility  Mobility Specialist Start Time (ACUTE ONLY) 0957  Mobility Specialist Stop Time (ACUTE ONLY) 1004  Mobility Specialist Time Calculation (min) (ACUTE ONLY) 7 min   Pt agreeable to mobility. Required no physical assistance during ambulation, SV for safety. Some SOB after ambulation, however SPO2 WFL. Pt returned back to bed and left with all needs met, call bell in reach.   Lauraine Erm Mobility Specialist Please contact via SecureChat or [employer] [phone]

## 2023-12-07 NOTE — Plan of Care (Signed)

## 2023-12-07 NOTE — Progress Notes (Signed)
 Orthopedic Tech Progress Note Patient Details:  Martin Bush November 12, 1955 [id number]  Ortho Devices Type of Ortho Device: Radio broadcast assistant Ortho Device/Splint Location: BLE Ortho Device/Splint Interventions: Ordered, Application, Adjustment   Post Interventions Patient Tolerated: Well Instructions Provided: Adjustment of device, Care of device   [name] 12/07/2023, 2:44 PM

## 2023-12-07 NOTE — Progress Notes (Signed)
 [facility] Progress Note Patient: Martin Bush FMW:[id number] DOB: March 21, 1955  DOA: 12/05/2023 DOS: the patient was seen and examined on 12/07/2023  Brief Hospital Course: Tannon Peerson is a 68 y.o. male with medical history significant of CAD s/p CABG x2 in 2002 (only LIMA to LAD being patent) and PCI (left main into superdominant LCx on 10/09/2019), ischemic cardiomyopathy, HFrEF (EF 35-40% in 08/2023), recurrent sustained VT s/p ICD implantation, hypertension, mixed hyperlipidemia, controlled diabetes mellitus, stage IIIa chronic kidney disease and bilateral carotid artery stenting (left TCAR on 05/26/2020 and a right TCAR on 07/07/2020) p/w SOB/DOE, BLE edema, and elevated BNP c/w HFrEF exacerbation.  Currently receiving IV diuresis.    Assessment and Plan: Acute on chronic HFrEF Bilateral pedal edema Presented with shortness of breath and swelling in the leg. Echocardiogram 8/25 with reduced LV systolic function EF 35 to 40%, global hypokinesis, akinesis of the left ventricular, and apical segment. RV systolic function preserved, LA with moderate dilatation, no significant valvular disease.  Patient was placed on IV furosemide  for diuresis.  Renal function worsening.  Still significantly volume ordered upon examination. Cardiology consulted. Continue Entresto .   CKD stage 3a, Monitor renal function. Baseline 1.2.  Worsening to 1.6. Holding diuresis. Consulted cardiology for further assistance with volume overload.   History of ventricular tachycardia S/p AICD Cont amiodarone    Type 2 diabetes mellitus with hyperlipidemia (HCC) Patient was placed on insulin  sliding scale and 10 units of basal insulin  with adequate glucose control.  At the time of discharge he will resume his home insulin  regimen.  Continue with statin therapy.    HTN (hypertension), benign Continue blood pressure control with carvedilol  and entresto  Holding amlodipine    Left main coronary artery  disease No chest pain or anginal, continue antiplatelet ( aspirin ) and statin.    Class 1 obesity Body mass index is 33.35 kg/m.    Prostate cancer. Recent underwent cystoscopy and radioactive seed placement on 10/25. He thinks that he may have difficulty urinating after the procedure due to swelling of his prostate. Currently improving.  Denies any bladder retention. Continue Flomax. Continue bladder scan.  Subjective: Feeling better.  Able to ambulate in the hallway.  No nausea or vomiting.  Physical Exam: Bilateral basal crackles. S1-S2 present. Bowel sound present Bilateral significant pedal edema.  Data Reviewed: I have Reviewed nursing notes, Vitals, and Lab results. Since last encounter, pertinent lab results CBC and BMP   . I have ordered test including CBC and BMP  . I have discussed pt's care plan and test results with cardiology  .   Disposition: Status is: Inpatient Remains inpatient appropriate because: Monitor for improvement in volume status  enoxaparin  (LOVENOX ) injection 40 mg Start: 12/05/23 2200 SCDs Start: 12/05/23 1000   Family Communication: Family at bedside Level of care: Telemetry   Vitals:   12/07/23 0418 12/07/23 0738 12/07/23 1156 12/07/23 1612  BP: (!) 124/53 118/65 126/66 (!) 141/66  Pulse: 62 60 63 63  Resp: 16 19 17 19   Temp: 98.4 F (36.9 C) 97.8 F (36.6 C) 98 F (36.7 C) 98.2 F (36.8 C)  TempSrc: Oral Oral Oral Oral  SpO2: 96% 94% 95% 93%  Weight:      Height:         Author: Yetta Blanch, MD 12/07/2023 6:37 PM  Please look on [email or web address] to find out who is on call.

## 2023-12-08 ENCOUNTER — Other Ambulatory Visit (HOSPITAL_COMMUNITY): Payer: Self-pay

## 2023-12-08 DIAGNOSIS — I5023 Acute on chronic systolic (congestive) heart failure: Secondary | ICD-10-CM | POA: Diagnosis not present

## 2023-12-08 LAB — CBC
HCT: 32.7 % — ABNORMAL LOW (ref 39.0–52.0)
Hemoglobin: 10.6 g/dL — ABNORMAL LOW (ref 13.0–17.0)
MCH: 30.1 pg (ref 26.0–34.0)
MCHC: 32.4 g/dL (ref 30.0–36.0)
MCV: 92.9 fL (ref 80.0–100.0)
Platelets: 164 K/uL (ref 150–400)
RBC: 3.52 MIL/uL — ABNORMAL LOW (ref 4.22–5.81)
RDW: 13.2 % (ref 11.5–15.5)
WBC: 7.1 K/uL (ref 4.0–10.5)
nRBC: 0 % (ref 0.0–0.2)

## 2023-12-08 LAB — BASIC METABOLIC PANEL WITH GFR
Anion gap: 11 (ref 5–15)
BUN: 34 mg/dL — ABNORMAL HIGH (ref 8–23)
CO2: 25 mmol/L (ref 22–32)
Calcium: 8.8 mg/dL — ABNORMAL LOW (ref 8.9–10.3)
Chloride: 98 mmol/L (ref 98–111)
Creatinine, Ser: 1.45 mg/dL — ABNORMAL HIGH (ref 0.61–1.24)
GFR, Estimated: 53 mL/min — ABNORMAL LOW (ref >60)
Glucose, Bld: 134 mg/dL — ABNORMAL HIGH (ref 70–99)
Potassium: 4 mmol/L (ref 3.5–5.1)
Sodium: 134 mmol/L — ABNORMAL LOW (ref 135–145)

## 2023-12-08 LAB — MAGNESIUM: Magnesium: 2 mg/dL (ref 1.7–2.4)

## 2023-12-08 LAB — GLUCOSE, CAPILLARY: Glucose-Capillary: 112 mg/dL — ABNORMAL HIGH (ref 70–99)

## 2023-12-08 MED ORDER — FUROSEMIDE 40 MG PO TABS
40.0000 mg | ORAL_TABLET | Freq: Every day | ORAL | Status: DC
  Administered 2023-12-08: 40 mg via ORAL
  Filled 2023-12-08: qty 1

## 2023-12-08 MED ORDER — FUROSEMIDE 20 MG PO TABS: 20.0000 mg | ORAL_TABLET | Freq: Every day | ORAL | Status: DC

## 2023-12-08 MED ORDER — FUROSEMIDE 40 MG PO TABS: 40.0000 mg | ORAL_TABLET | Freq: Every day | ORAL | 1 refills | Status: DC

## 2023-12-08 MED ORDER — EMPAGLIFLOZIN 25 MG PO TABS
25.0000 mg | ORAL_TABLET | Freq: Every day | ORAL | 1 refills | Status: DC
  Filled 2023-12-08: qty 30, 30d supply, fill #0

## 2023-12-08 MED ORDER — FUROSEMIDE 40 MG PO TABS
40.0000 mg | ORAL_TABLET | Freq: Every day | ORAL | 1 refills | Status: AC
  Filled 2023-12-08: qty 30, 30d supply, fill #0

## 2023-12-08 NOTE — Progress Notes (Signed)
 Progress Note  Patient Name: Martin Bush Date of Encounter: 12/08/2023 Primary Cardiologist: Gordy Bergamo, MD   Subjective   Overnight no events. Patient notes that he feel back to normal.  Always has mild Sob.  NO CP.  No Palpitations. In regard to etiology of HF Decompensation: he notes that sine getting implants by urology has had difficulty voiding.  He is concerned that this plus dietary indiscretions lead to event.  Vital Signs    Vitals:   12/07/23 2030 12/07/23 2341 12/08/23 0343 12/08/23 0803  BP:  125/60 (!) 111/54 (!) 113/44  Pulse:  64 (!) 59 67  Resp: 18 17 19 16   Temp:  98 F (36.7 C) 98.1 F (36.7 C) 97.9 F (36.6 C)  TempSrc:  Oral Axillary Oral  SpO2:  93% 93% 98%  Weight:      Height:        Intake/Output Summary (Last 24 hours) at 12/08/2023 0840 Last data filed at 12/08/2023 0815 Gross per 24 hour  Intake 650.06 ml  Output 2025 ml  Net -1374.94 ml   Filed Weights   12/05/23 0341 12/06/23 1304  Weight: 98.9 kg 96.6 kg    Physical Exam   GEN: No acute distress.   Neck: No JVD Cardiac: RRR, no murmurs, rubs, or gallops.  Respiratory: Clear to auscultation bilaterally. GI: Soft, nontender, distended with dullness to percussion MS: No edema; UNNA boot noted  Labs   Telemetry: V P with rare PVCs   Chemistry Recent Labs  Lab 12/06/23 0848 12/07/23 0222 12/08/23 0202  NA 134* 135 134*  K 3.9 3.6 4.0  CL 100 96* 98  CO2 24 27 25   GLUCOSE 181* 181* 134*  BUN 29* 34* 34*  CREATININE 1.34* 1.65* 1.45*  CALCIUM  8.9 8.6* 8.8*  GFRNONAA 58* 45* 53*  ANIONGAP 10 12 11      Hematology Recent Labs  Lab 12/06/23 0848 12/07/23 0222 12/08/23 0202  WBC 8.0 7.2 7.1  RBC 3.70* 3.36* 3.52*  HGB 11.0* 10.1* 10.6*  HCT 34.7* 30.7* 32.7*  MCV 93.8 91.4 92.9  MCH 29.7 30.1 30.1  MCHC 31.7 32.9 32.4  RDW 13.5 13.4 13.2  PLT 167 153 164      BNP Recent Labs  Lab 12/05/23 0344  BNP 1,327.3*      Cardiac Studies   Cardiac  Studies & Procedures   ______________________________________________________________________________________________ CARDIAC CATHETERIZATION  CARDIAC CATHETERIZATION 12/11/2019  Conclusion Images from the original result were not included. LM: Protected. Patent LM/LCx stent LAD: Prox 100% occluded. Bypassed by patent LIMA attaching in mid LAD Mid to distal LAD 50% disease LCxL: Patent LM/LCx stent. OM3 60% stenosis RCA: Small caliber with severe diffuse disease Dampening of the pressures noted on engagement  No change in coronary anatomy compared to prior cath in 09/2019 Current presentation likely due to acute on chronic HFrEF Continue guideline directed medical therapy Stop heparin . Resume brilinta  with 180 mg loading dose, then 90 mg bid.   Newman JINNY Lawrence, MD Pager: [phone] Office: [phone]  Findings Coronary Findings Diagnostic  Dominance: Left  Left Main Previously placed Ost LM to Dist LM drug eluting stent is widely patent.  Left Anterior Descending Ost LAD to Mid LAD lesion is 100% stenosed. The lesion was previously treated. Mid LAD lesion is 50% stenosed.  Second Diagonal Branch Collaterals 2nd Diag filled by collaterals from 3rd Diag.  Left Circumflex Previously placed Ost Cx to Prox Cx drug eluting stent is widely patent.  Third Obtuse Marginal  Branch 3rd Mrg lesion is 60% stenosed. The lesion was previously treated.  Left Posterior Atrioventricular Artery Non-stenotic LPAV lesion.  Right Coronary Artery There is severe diffuse disease throughout the vessel.  LIMA LIMA Graft To Mid LAD LIMA and is normal in caliber. The graft exhibits no disease.  Intervention  No interventions have been documented.   CARDIAC CATHETERIZATION  CARDIAC CATHETERIZATION 10/09/2019  Conclusion Left heart catheterization 10/09/2019: LV: Normal LVEDP.  There was no pressure gradient across the aortic valve.  Angiogram not performed to conserve  contrast. Right coronary artery nondominant and severely diffusely diseased unchanged from prior cardiac catheterization. Left main: Mid to distal left main has in-stent restenosis of 99% and extends into the dominant circumflex which also has proximal in-stent 99% stenosis with TIMI II flow.  Successful scoring balloon angioplasty with 3.5 x 15 mm Wolverine at high 14 atmospheric pressure, due to recall, haziness, stented with 4.0 x 18 mm resolute Onyx DES at 18 atmospheric pressure for 60 seconds, stenosis reduced in the left main and proximal in-stent restenosis from 99% to 0% with improvement in TIMI flow from 2-3. Successful PTCA and Cutting Balloon angioplasty with Wolverine 3.0 x 10 mm balloon in the mid segment of the dominant circumflex coronary artery in-stent restenosis.  Stenosis reduced to 0% at 14 atmospheric pressure inflations x2.  TIMI-3 to TIMI-3 flow maintained. LAD: Is occluded in the proximal segment.  Distal LAD supplied by LIMA.  LIMA to LAD is widely patent.  Recommendation: Patient will be observed for 6 to 8 hours, will be ambulated, if he remains stable, I would like to discharge him today in view of COVID-19 pandemic.  70 mL of contrast was utilized.  Extremely complex patient but with excellent results and hopefully he will not have recurrent restenosis in the left main and proximal circumflex stent.  With regard to risk factor modification, he is on appropriate medical therapy, he is also on dual antiplatelet therapy.  Diabetes needs to be controlled better.  Findings Coronary Findings Diagnostic  Dominance: Left  Left Main Ost LM to Dist LM lesion is 99% stenosed. The lesion was previously treated.  Left Anterior Descending Ost LAD to Mid LAD lesion is 100% stenosed. The lesion was previously treated. Mid LAD lesion is 50% stenosed.  Second Diagonal Branch Collaterals 2nd Diag filled by collaterals from 3rd Diag.  Left Circumflex Ost Cx to Prox Cx lesion is  99% stenosed. The lesion was previously treated.  Third Obtuse Marginal Branch 3rd Mrg lesion is 60% stenosed. The lesion was previously treated.  Left Posterior Atrioventricular Artery LPAV lesion is 90% stenosed. The lesion was previously treated.  Right Coronary Artery There is severe diffuse disease throughout the vessel.  LIMA LIMA Graft To Mid LAD LIMA and is normal in caliber. The graft exhibits no disease.  Intervention  Ost LM to Dist LM lesion Stent A drug-eluting stent was successfully placed using a STENT RESOLUTE ONYX 4.0X18. Maximum pressure: 18 atm. Inflation time: 60 sec. Stent strut is well apposed. Post-stent angioplasty was not performed. Stent Pre-stent angioplasty was performed using a BALLOON WOLVERINE 3.75X15. Maximum pressure:  14 atm. Inflation time:  90 sec. A drug-eluting stent was successfully placed using a STENT RESOLUTE ONYX 4.0X18. Maximum pressure: 18 atm. Inflation time: 60 sec. Stent strut is well apposed. Post-stent angioplasty was not performed. The stent was extended into the proximal circumflex restenosis. Post-Intervention Lesion Assessment The intervention was successful. There is a 0% residual stenosis post intervention.  Ost Cx  to Prox Cx lesion Stent A drug-eluting stent was successfully placed using a STENT RESOLUTE ONYX 4.0X18. Post-stent angioplasty was not performed. Post-Intervention Lesion Assessment The intervention was successful. Pre-interventional TIMI flow is 3. Post-intervention TIMI flow is 3. No complications occurred at this lesion.  LM extending into proximal circumflex stenting with 4.0x18 mm Resolute Onyx DES There is a 0% residual stenosis post intervention.  LPAV lesion Angioplasty Lesion length:  10 mm. CATH LAUNCHER 6FR EBU3.5 guide catheter was inserted. Scoring balloon angioplasty was performed using a BALLOON WOLVERINE 3.00X10. Maximum pressure: 14 atm. Inflation time: 90 sec. Post-Intervention Lesion  Assessment The intervention was successful. Pre-interventional TIMI flow is 3. Post-intervention TIMI flow is 3. No complications occurred at this lesion. There is a 0% residual stenosis post intervention.     ECHOCARDIOGRAM  ECHOCARDIOGRAM COMPLETE 09/12/2023  Narrative ECHOCARDIOGRAM REPORT    Patient Name:   Darwyn Ponzo Date of Exam: 09/12/2023 Medical Rec #:  [id number]         Height:       67.0 in Accession #:    [id number]        Weight:       216.0 lb Date of Birth:  [birth year]        BSA:          2.089 m Patient Age:    67 years          BP:           116/68 mmHg Patient Gender: M                 HR:           79 bpm. Exam Location:  [address]  Procedure: 2D Echo, 3D Echo, Cardiac Doppler and Color Doppler (Both Spectral and Color Flow Doppler were utilized during procedure).  Indications:    I25.10 CAD I50.9 CHF  History:        Patient has prior history of Echocardiogram examinations, most recent 05/24/2022. CHF, CAD and Previous Myocardial Infarction, Prior CABG, Defibrillator and Prior Cardiac Surgery, PAD; Risk Factors:Sleep Apnea, Hypertension, Diabetes, Dyslipidemia, Family History of Coronary Artery Disease and Former Smoker. Prior EF 25-30% (Improved From 20-25% in 2019).  Sonographer:    Heather Hawks RDCS Referring Phys: GORDY BERGAMO   Sonographer Comments: Image acquisition challenging due to patient body habitus and Image acquisition challenging due to respiratory motion. IMPRESSIONS   1. Left ventricular ejection fraction, by estimation, is 35 to 40%. The left ventricle has moderately decreased function. The left ventricle demonstrates global hypokinesis. The left ventricular internal cavity size was mildly dilated. There is mild concentric left ventricular hypertrophy. Left ventricular diastolic parameters are indeterminate. 2. Right ventricular systolic function is mildly reduced. The right ventricular size is moderately enlarged. 3.  Left atrial size was mildly dilated. 4. The mitral valve is normal in structure. No evidence of mitral valve regurgitation. No evidence of mitral stenosis. 5. The aortic valve is tricuspid. Aortic valve regurgitation is trivial. Aortic valve sclerosis is present, with no evidence of aortic valve stenosis. 6. The inferior vena cava is normal in size with greater than 50% respiratory variability, suggesting right atrial pressure of 3 mmHg.  Comparison(s): Prior images reviewed side by side. Similar function with decreased dilation; this is a technically difficult study.  FINDINGS Left Ventricle: Calcified papillary muscle. Left ventricular ejection fraction, by estimation, is 35 to 40%. The left ventricle has moderately decreased function. The left ventricle demonstrates global hypokinesis. The left ventricular  internal cavity size was mildly dilated. There is mild concentric left ventricular hypertrophy. Left ventricular diastolic parameters are indeterminate.  Right Ventricle: The right ventricular size is moderately enlarged. No increase in right ventricular wall thickness. Right ventricular systolic function is mildly reduced.  Left Atrium: Left atrial size was mildly dilated.  Right Atrium: Right atrial size was normal in size.  Pericardium: There is no evidence of pericardial effusion.  Mitral Valve: The mitral valve is normal in structure. No evidence of mitral valve regurgitation. No evidence of mitral valve stenosis.  Tricuspid Valve: The tricuspid valve is normal in structure. Tricuspid valve regurgitation is not demonstrated. No evidence of tricuspid stenosis.  Aortic Valve: The aortic valve is tricuspid. Aortic valve regurgitation is trivial. Aortic regurgitation PHT measures 439 msec. Aortic valve sclerosis is present, with no evidence of aortic valve stenosis.  Pulmonic Valve: The pulmonic valve was not well visualized. Pulmonic valve regurgitation is not visualized. No evidence  of pulmonic stenosis.  Aorta: The aortic root and ascending aorta are structurally normal, with no evidence of dilitation.  Venous: The inferior vena cava is normal in size with greater than 50% respiratory variability, suggesting right atrial pressure of 3 mmHg.  IAS/Shunts: No atrial level shunt detected by color flow Doppler.  Additional Comments: 3D was performed not requiring image post processing on an independent workstation and was abnormal.   LEFT VENTRICLE PLAX 2D LVIDd:         5.85 cm   Diastology LVIDs:         4.70 cm   LV e' medial:    4.13 cm/s LV PW:         1.15 cm   LV E/e' medial:  16.0 LV IVS:        1.20 cm   LV e' lateral:   3.37 cm/s LVOT diam:     2.40 cm   LV E/e' lateral: 19.6 LV SV:         85 LV SV Index:   41 LVOT Area:     4.52 cm  3D Volume EF: 3D EF:        42 % LV EDV:       237 ml LV ESV:       137 ml LV SV:        100 ml  RIGHT VENTRICLE RV Basal diam:  4.25 cm RV Mid diam:    2.97 cm RV S prime:     6.47 cm/s TAPSE (M-mode): 1.5 cm  LEFT ATRIUM             Index        RIGHT ATRIUM           Index LA diam:        4.55 cm 2.18 cm/m   RA Pressure: 3.00 mmHg LA Vol (A2C):   71.1 ml 34.03 ml/m  RA Area:     19.00 cm LA Vol (A4C):   77.3 ml 36.99 ml/m  RA Volume:   58.10 ml  27.81 ml/m LA Biplane Vol: 74.6 ml 35.70 ml/m AORTIC VALVE LVOT Vmax:   76.45 cm/s LVOT Vmean:  50.650 cm/s LVOT VTI:    0.188 m AI PHT:      439 msec  AORTA Ao Root diam: 3.40 cm Ao Asc diam:  3.70 cm  MITRAL VALVE               TRICUSPID VALVE MV Area (PHT)  cm  Estimated RAP:  3.00 mmHg MV Decel Time: 219 msec MV E velocity: 65.90 cm/s  SHUNTS MV A velocity: 83.65 cm/s  Systemic VTI:  0.19 m MV E/A ratio:  0.79        Systemic Diam: 2.40 cm  Stanly Leavens MD Electronically signed by Stanly Leavens MD Signature Date/Time: 09/12/2023/4:49:33 PM    Final           ______________________________________________________________________________________________        Assessment & Plan    Heart Failure Reduced Ejection Fraction (combined systolic and diastolic) - acute chronic and biventricular, LVEF 35%- echo - NYHA class I, Stage C, near euvolemic, etiology from ischemia - Diuretic regimen: lasix  40 mg PO Daily (higher dose than his 20 mg daily at discharge)  - Discussed the importance of fluid restriction of < 2 L, salt restriction, and checking daily weights   - Coreg /bisoprolol/metoprolol  25 mg PO BID  - ARNI/ARB/ACEi: ARNI at 49/51; no uptitration with AKI - Consider being cautious with SGLT2i in the setting of his prostate cancer; the 25 mg doing is not cardiac in nature and managed by his internist for DM - aldactone/eplerenone as outpatient if AKI continues to improve   - Device Indications: Hx of VT and s/p ICD, V pacing with narrow complex  VT- s/p ICD, rare PVCs today, no change to his amiodarone   CAD/HLD-> CP resolved with decongestion, no ischemic work up plan.  Continue Zetia  and statin.  ASA at DC; as outpatient consider switch to plavix monotherapy  HTN - I do not have a stroke indication for amlodipine  at this time, this may be Dc'ed if IM team agrees.   Explained that both Dr. Caleen and I are new to his care today.  Patient is nearing discharge and, from a cardiac perspective now needs no procedures or IV therapies.  DC order for nitroglycerin  patch  For questions or updates, please contact [facility] Please consult [email or web address] for contact info under Cardiology/STEMI.      Stanly Leavens, MD FASE [facility] Cardiologist [facility]  [address] [place], [address] [place], [place] [address] [phone]  8:40 AM

## 2023-12-08 NOTE — Plan of Care (Signed)
  Problem: Skin Integrity: Goal: Risk for impaired skin integrity will decrease Outcome: Progressing   Problem: Tissue Perfusion: Goal: Adequacy of tissue perfusion will improve Outcome: Progressing   Problem: Clinical Measurements: Goal: Diagnostic test results will improve Outcome: Progressing Goal: Respiratory complications will improve Outcome: Progressing Goal: Cardiovascular complication will be avoided Outcome: Progressing   Problem: Activity: Goal: Risk for activity intolerance will decrease Outcome: Progressing   Problem: Nutrition: Goal: Adequate nutrition will be maintained Outcome: Progressing   Problem: Coping: Goal: Level of anxiety will decrease Outcome: Progressing   Problem: Elimination: Goal: Will not experience complications related to bowel motility Outcome: Progressing Goal: Will not experience complications related to urinary retention Outcome: Progressing   Problem: Pain Managment: Goal: General experience of comfort will improve and/or be controlled Outcome: Progressing   Problem: Safety: Goal: Ability to remain free from injury will improve Outcome: Progressing   Problem: Skin Integrity: Goal: Risk for impaired skin integrity will decrease Outcome: Progressing

## 2023-12-08 NOTE — Discharge Summary (Signed)
 Physician Discharge Summary   Patient: Martin Bush MRN: [id number] DOB: [birth year]  Admit date:     12/05/2023  Discharge date: 12/08/23  Discharge Physician: Amaryllis Dare   PCP: [facility]   Recommendations at discharge:  Please obtain CBC and BMP on follow-up Follow-up with cardiology Follow-up with primary care provider  Discharge Diagnoses: Principal Problem:   Acute on chronic HFrEF (heart failure with reduced ejection fraction) (HCC) Active Problems:   Type 2 diabetes mellitus with hyperlipidemia (HCC)   HTN (hypertension), benign   Class 1 obesity   Hyperlipidemia   Coronary artery disease involving autologous artery coronary bypass graft without angina pectoris   OSA on CPAP   Carotid artery stenosis   ICD; Biventricular  Medtronic ICD Amplia MRI QWuad CRT-D  in situ 10/29/14   Hx of CABG   Elevated troponin   Acute systolic congestive heart failure (HCC)   Stage 3b chronic kidney disease (HCC)   Malignant neoplasm of prostate (HCC)  Resolved Problems:   * No resolved hospital problems. *  Hospital Course: Martin Bush is a 68 y.o. male with medical history significant of CAD s/p CABG x2 in 2002 (only LIMA to LAD being patent) and PCI (left main into superdominant LCx on 10/09/2019), ischemic cardiomyopathy, HFrEF (EF 35-40% in 08/2023), recurrent sustained VT s/p ICD implantation, hypertension, mixed hyperlipidemia, controlled diabetes mellitus, stage IIIa chronic kidney disease and bilateral carotid artery stenting (left TCAR on 05/26/2020 and a right TCAR on 07/07/2020) p/w SOB/DOE, BLE edema, and elevated BNP c/w HFrEF exacerbation.   Patient was diuresed with IV Lasix , responded well and it was discontinued when renal function started getting worse.  Cardiology increased the home dose of Lasix  from 20 mg to 40 mg daily with advised to take an extra pill if there is a weight gain of 3 pounds in 1 day and 5 pounds in 1 week.  He will need a close  follow-up with cardiology and heart failure clinic for further assistance.  Patient has an history of CKD stage III A, mild AKI with diuresis which started improving after holding IV Lasix .  Creatinine of 1.45 on the day of discharge with baseline around 1.2.  Patient also has an history of ventricular tachycardia and have AICD in place, he will continue with amiodarone .  Patient with class I obesity which can complicate overall prognosis.  Patient also has an history of prostate cancer recently underwent cystoscopy and radioactive seed placement on 10/25 and did develop mild urinary difficulty after the procedure, on no bladder retention and he will continue with Flomax and outpatient follow-up with urology.  Patient will continue on current medications and need to have a close follow-up with his providers for further assistance.  Consultants: Cardiology Procedures performed: None Disposition: Home Diet recommendation:  Discharge Diet Orders (From admission, onward)     Start     Ordered   12/08/23 0000  Diet - low sodium heart healthy        12/08/23 1023           Cardiac and Carb modified diet DISCHARGE MEDICATION: Allergies as of 12/08/2023       Reactions   Diltiazem Rash        Medication List     STOP taking these medications    amLODipine  5 MG tablet Commonly known as: NORVASC        TAKE these medications    amiodarone  200 MG tablet Commonly known as: PACERONE  Take 0.5 tablets (  100 mg total) by mouth daily.   aspirin  EC 81 MG tablet Take 1 tablet (81 mg total) by mouth daily.   calcium  carbonate 1250 (500 Ca) MG chewable tablet Commonly known as: OS-CAL Chew 1 tablet by mouth daily.   carvedilol  25 MG tablet Commonly known as: COREG  Take 1 tablet (25 mg total) by mouth 2 (two) times daily with a meal.   Entresto  49-51 MG Generic drug: sacubitril -valsartan  Take 1 tablet by mouth 2 (two) times daily.   ezetimibe  10 MG tablet Commonly known  as: ZETIA  Take 1 tablet (10 mg total) by mouth daily.   ferrous sulfate  325 (65 FE) MG tablet Take 325 mg by mouth every Monday, Wednesday, and Friday. In the morning   furosemide  40 MG tablet Commonly known as: LASIX  Take 1 tablet (40 mg total) by mouth daily. Start taking on: December 09, 2023 What changed:  medication strength how much to take   Jardiance  25 MG Tabs tablet Generic drug: empagliflozin  Take 25 mg by mouth daily.   metFORMIN  500 MG tablet Commonly known as: GLUCOPHAGE  Take 500 mg by mouth 2 (two) times daily with a meal.   Mounjaro 7.5 MG/0.5ML Pen Generic drug: tirzepatide Inject 7.5 mg into the skin once a week.   nitroGLYCERIN  0.4 MG SL tablet Commonly known as: NITROSTAT  Place 1 tablet (0.4 mg total) under the tongue every 5 (five) minutes x 3 doses as needed for chest pain.   NovoLIN 70/30 Kwikpen (70-30) 100 UNIT/ML KwikPen Generic drug: insulin  isophane & regular human KwikPen Inject 24 Units into the skin daily as needed (for blood glucose 180 or greater).   OXYGEN  Inhale 2 L/min into the lungs See admin instructions. 2 L/min at bedtime in conjunction with CPAP   PRESCRIPTION MEDICATION Inhale into the lungs See admin instructions. CPAP- At bedtime   relugolix 120 MG tablet Commonly known as: ORGOVYX Take 120 mg by mouth daily.   rosuvastatin  40 MG tablet Commonly known as: CRESTOR  Take 1 tablet (40 mg total) by mouth at bedtime.   sertraline  50 MG tablet Commonly known as: ZOLOFT  Take 50 mg by mouth at bedtime.   tamsulosin 0.4 MG Caps capsule Commonly known as: FLOMAX Take 1 capsule (0.4 mg total) by mouth at bedtime.   traMADol 50 MG tablet Commonly known as: ULTRAM Take 1-2 tablets (50-100 mg total) by mouth every 6 (six) hours as needed (pain).   Vitamin D3 50 MCG (2000 UT) Tabs Take 2,000 Units by mouth 2 (two) times daily.        Follow-up Information     [facility] Schedule an appointment as soon as  possible for a visit in 1 week(s).   Contact information: [address] [facility] [place] [address] [phone]                Discharge Exam: Fredricka Weights   12/05/23 0341 12/06/23 1304  Weight: 98.9 kg 96.6 kg   General.  Obese gentleman, in no acute distress. Pulmonary.  Lungs clear bilaterally, normal respiratory effort. CV.  Regular rate and rhythm, no JVD, rub or murmur. Abdomen.  Soft, nontender, nondistended, BS positive. CNS.  Alert and oriented .  No focal neurologic deficit. Extremities.  No edema,  pulses intact and symmetrical.  Unna boot in place Psychiatry.  Judgment and insight appears normal.   Condition at discharge: stable  The results of significant diagnostics from this hospitalization (including imaging, microbiology, ancillary and laboratory) are listed below for reference.  Imaging Studies: DG Chest 2 View Result Date: 12/05/2023 EXAM: 2 VIEW(S) XRAY OF THE CHEST 12/05/2023 04:11:00 AM COMPARISON: 11/24/2021 PET CT 08/31/2023 08/31/2023). CLINICAL HISTORY: 68 year old male with shortness of breath. FINDINGS: LINES, TUBES AND DEVICES: Left subclavian AICD in place. LUNGS AND PLEURA: Mildly improved lung volumes. 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 Regressed pulmonary vascularity compared to the prior x ray. No overt edema. No areas of worsening ventilation. No pneumothorax. HEART AND MEDIASTINUM: Aortic atherosclerosis. Chronic sternotomy, CABG, ACDF are stable. No acute abnormality of the cardiac and mediastinal silhouettes. BONES AND SOFT TISSUES: No acute osseous abnormality. IMPRESSION: 1. No acute cardiopulmonary abnormality. 2. 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 Electronically signed by: Helayne Hurst MD 12/05/2023 04:30 AM EST RP Workstation:  [id number]   DG C-Arm 1-60 Min-No Report Result Date: 11/24/2023 Fluoroscopy was utilized by the requesting physician.  No radiographic interpretation.    Microbiology: Results for orders placed or performed during the hospital encounter of 12/05/23  MRSA Next Gen by PCR, Nasal     Status: None   Collection Time: 12/06/23  1:06 PM   Specimen: Nasal Mucosa; Nasal Swab  Result Value Ref Range Status   MRSA by PCR Next Gen NOT DETECTED NOT DETECTED Final    Comment: (NOTE) The GeneXpert MRSA Assay (FDA approved for NASAL specimens only), is one component of a comprehensive MRSA colonization surveillance program. It is not intended to diagnose MRSA infection nor to guide or monitor treatment for MRSA infections. Test performance is not FDA approved in patients less than 73 years old. Performed at [facility], [address], [place], [place] [address]     Labs: CBC: Recent Labs  Lab 12/05/23 0344 12/06/23 0848 12/07/23 0222 12/08/23 0202  WBC 11.4* 8.0 7.2 7.1  HGB 11.4* 11.0* 10.1* 10.6*  HCT 36.1* 34.7* 30.7* 32.7*  MCV 95.5 93.8 91.4 92.9  PLT 184 167 153 164   Basic Metabolic Panel: Recent Labs  Lab 12/05/23 0344 12/06/23 0848 12/07/23 0222 12/08/23 0202  NA 136 134* 135 134*  K 4.0 3.9 3.6 4.0  CL 103 100 96* 98  CO2 24 24 27 25   GLUCOSE 379* 181* 181* 134*  BUN 23 29* 34* 34*  CREATININE 1.39* 1.34* 1.65* 1.45*  CALCIUM  8.8* 8.9 8.6* 8.8*  MG  --  1.7 1.6* 2.0   Liver Function Tests: No results for input(s): AST, ALT, ALKPHOS, BILITOT, PROT, ALBUMIN in the last 168 hours. CBG: Recent Labs  Lab 12/07/23 0617 12/07/23 1153 12/07/23 1611 12/07/23 2049 12/08/23 0601  GLUCAP 158* 195* 139* 134* 112*    Discharge time spent: greater than 30 minutes.  This record has been created using Conservation officer, historic buildings. Errors have been sought and corrected,but may not always be located. Such creation errors do not reflect on the  standard of care.   Signed: Amaryllis Dare, MD [facility] 12/08/2023

## 2023-12-08 NOTE — Progress Notes (Signed)
   Heart Failure Stewardship Pharmacist Progress Note   PCP: [facility] PCP-Cardiologist: Gordy Bergamo, MD    HPI:  68 yo M with PMH of CAD s/p CABG x 2 and PCI later, ICM, HFrEF, recurrent sustained VT s/p ICD, HTN, HLD, T2DM, CKD IIIa, TCAR, and prostate cancer.  Presented to the ED on 11/10 with chest pain, weight gain, shortness of breath, and orthopnea. CXR with no acute cardiopulmonary abnormality. BNP elevated. Started on IV lasix . Last EF from 08/2023 was 35-40%.  Reports improvement in shortness of breath. LE edema is improving, UNNA boots in place. Has been off Jardiance  recently because he was having issues getting the refills from the [place]. Otherwise, has tolerated medications well.   Current HF Medications: Diuretic: furosemide  40 mg PO daily Beta Blocker: carvedilol  25 mg BID ACE/ARB/ARNI: Entresto  49/51 mg BID  Prior to admission HF Medications: Diuretic: furosemide  20 mg daily Beta blocker: carvedilol  25 mg BID ACE/ARB/ARNI: Entresto  49/51 mg BID SGLT2i: Jardiance  25 mg daily  Pertinent Lab Values: Serum creatinine 1.34>1.65>1.45, BUN 34, Potassium 4.0, Sodium 134, BNP 1327.3, Magnesium  2.0, A1c 8.7   Vital Signs: Weight: 212 lbs (admission weight: 218 lbs) Blood pressure: 110/50s  Heart rate: 50-60s  I/O: net -1L yesterday; net -6.3L since admission  Medication Assistance / Insurance Benefits Check: Does the patient have prescription insurance?  Yes Type of insurance plan: [facility]  Outpatient Pharmacy:  Prior to admission outpatient pharmacy: [facility] Is the patient willing to use [facility] TOC pharmacy at discharge? Yes Is the patient willing to transition their outpatient pharmacy to utilize a [facility] outpatient pharmacy?   No    Assessment: 1. Acute on chronic systolic CHF (LVEF 35-40%), due to ICM. NYHA class III symptoms. - Agree with increasing to furosemide  40 mg PO daily. Strict I/Os and daily weights. Keep K>4 and Mg>2 - Continue  carvedilol  25 mg BID - Continue Entresto  49/51 mg BID - Consider restarting Jardiance  and adding spironolactone pending creatinine trend   Plan: 1) Medication changes recommended at this time: - Add Jardiance  10 mg daily  2) Patient assistance: - [facility] - Requesting refill of Jardiance  through [facility] pharmacy as he is out of this at home  3)  Education  - Patient has been educated on current HF medications and potential additions to HF medication regimen - Patient verbalizes understanding that over the next few months, these medication doses may change and more medications may be added to optimize HF regimen - Patient has been educated on basic disease state pathophysiology and goals of therapy   Martin Bush, PharmD, BCPS Heart Failure Stewardship Pharmacist Phone [phone]

## 2023-12-08 NOTE — TOC Transition Note (Signed)
 Transition of Care [facility]) - Discharge Note   Patient Details  Name: Eben Choinski MRN: [id number] Date of Birth: 1955-11-29  Transition of Care [facility]) CM/SW Contact:  Roxie KANDICE Stain, RN Phone Number: 12/08/2023, 11:58 AM   Clinical Narrative:    Addie Gordy Kelly is stable to discharge home. Follow up apt on AVS. No ICM (Inpatient Care Management) needs at this time.    Final next level of care: Home/Self Care Barriers to Discharge: Barriers Resolved   Patient Goals and CMS Choice Patient states their goals for this hospitalization and ongoing recovery are:: return home          Discharge Placement               home        Discharge Plan and Services Additional resources added to the After Visit Summary for                                       Social Drivers of Health (SDOH) Interventions SDOH Screenings   Food Insecurity: No Food Insecurity (12/06/2023)  Housing: Low Risk  (12/06/2023)  Transportation Needs: No Transportation Needs (12/06/2023)  Utilities: Not At Risk (12/06/2023)  Alcohol Screen: Low Risk  (09/08/2023)  Depression (PHQ2-9): Low Risk  (09/12/2023)  Financial Resource Strain: Low Risk  (12/05/2020)  Physical Activity: Unknown (04/25/2017)  Social Connections: Moderately Isolated (12/06/2023)  Stress: No Stress Concern Present (12/05/2020)  Tobacco Use: Medium Risk (12/05/2023)     Readmission Risk Interventions    12/08/2023   11:58 AM  Readmission Risk Prevention Plan  Transportation Screening Complete  PCP or Specialist Appt within 5-7 Days Complete  Home Care Screening Complete  Medication Review (RN CM) Complete

## 2023-12-09 ENCOUNTER — Ambulatory Visit: Payer: No Typology Code available for payment source

## 2023-12-09 DIAGNOSIS — I5042 Chronic combined systolic (congestive) and diastolic (congestive) heart failure: Secondary | ICD-10-CM | POA: Diagnosis not present

## 2023-12-11 ENCOUNTER — Ambulatory Visit: Payer: Self-pay | Admitting: Cardiology

## 2023-12-11 LAB — CUP PACEART REMOTE DEVICE CHECK
Battery Remaining Longevity: 105 mo
Battery Voltage: 3.01 V
Brady Statistic RV Percent Paced: 85.5 %
Date Time Interrogation Session: 20251114021800
HighPow Impedance: 50 Ohm
Implantable Lead Connection Status: 753985
Implantable Lead Connection Status: 753985
Implantable Lead Connection Status: 753985
Implantable Lead Implant Date: 20161024
Implantable Lead Implant Date: 20161024
Implantable Lead Implant Date: 20161024
Implantable Lead Location: 753858
Implantable Lead Location: 753859
Implantable Lead Location: 753860
Implantable Lead Model: 4598
Implantable Lead Model: 5076
Implantable Pulse Generator Implant Date: 20250211
Lead Channel Impedance Value: 285 Ohm
Lead Channel Impedance Value: 323 Ohm
Lead Channel Impedance Value: 342 Ohm
Lead Channel Impedance Value: 361 Ohm
Lead Channel Impedance Value: 437 Ohm
Lead Channel Impedance Value: 475 Ohm
Lead Channel Impedance Value: 494 Ohm
Lead Channel Impedance Value: 608 Ohm
Lead Channel Impedance Value: 646 Ohm
Lead Channel Impedance Value: 722 Ohm
Lead Channel Impedance Value: 741 Ohm
Lead Channel Impedance Value: 779 Ohm
Lead Channel Impedance Value: 798 Ohm
Lead Channel Pacing Threshold Amplitude: 0.625 V
Lead Channel Pacing Threshold Amplitude: 1.125 V
Lead Channel Pacing Threshold Amplitude: 1.25 V
Lead Channel Pacing Threshold Pulse Width: 0.4 ms
Lead Channel Pacing Threshold Pulse Width: 0.4 ms
Lead Channel Pacing Threshold Pulse Width: 0.4 ms
Lead Channel Sensing Intrinsic Amplitude: 1.9 mV
Lead Channel Sensing Intrinsic Amplitude: 10.5 mV
Lead Channel Setting Pacing Amplitude: 1.75 V
Lead Channel Setting Pacing Amplitude: 2 V
Lead Channel Setting Pacing Amplitude: 2 V
Lead Channel Setting Pacing Pulse Width: 0.4 ms
Lead Channel Setting Pacing Pulse Width: 0.4 ms
Lead Channel Setting Sensing Sensitivity: 0.3 mV
Zone Setting Status: 755011
Zone Setting Status: 755011
Zone Setting Status: 755011

## 2023-12-12 NOTE — Progress Notes (Signed)
 Remote ICD Transmission

## 2023-12-12 NOTE — Progress Notes (Signed)
 Remote pacemaker transmission.

## 2023-12-13 NOTE — Progress Notes (Signed)
 Post-seed nursing interview for a diagnosis of 68 y.o. 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   Patient identity verified x2.   Patient states issues as follows...  -Pain: No -Fatigue: Yes -Abdomen: None -Groin: None -Urinary: None now, but patient was admitted to the hospital on 11/10 for urinary retention  -Bowels: None -Appetite: Normal -Weight: 212.6  Patient denies all other related issues at this time.  Meaningful use complete.  I-PSS (AUA) score- 8 - Moderate SHIM (ED) score- 21 Urinary Management medication(s) Flomax  Urology appointment date- Patient had an appointment with Dr. Renda on 12/14/23  Vitals- BP 130/67 (BP Location: Left Arm, Patient Position: Sitting, Cuff Size: Large)   Pulse 82   Temp 97.7 F (36.5 C)   Resp 20   Ht 5' 7 (1.702 m)   Wt 212 lb (96.2 kg)   SpO2 98%   BMI 33.20 kg/m    This concludes the interaction.

## 2023-12-14 ENCOUNTER — Encounter: Payer: Self-pay | Admitting: Radiation Oncology

## 2023-12-14 ENCOUNTER — Ambulatory Visit
Admission: RE | Admit: 2023-12-14 | Discharge: 2023-12-14 | Disposition: A | Discharge status: Home / Self Care | Source: Ambulatory Visit | Attending: Radiation Oncology | Admitting: Radiation Oncology

## 2023-12-14 VITALS — BP 122/68 | HR 75 | Temp 98.0°F | Resp 16 | Ht 67.0 in | Wt 211.6 lb

## 2023-12-14 DIAGNOSIS — Z923 Personal history of irradiation: Secondary | ICD-10-CM | POA: Insufficient documentation

## 2023-12-14 DIAGNOSIS — I5023 Acute on chronic systolic (congestive) heart failure: Secondary | ICD-10-CM | POA: Insufficient documentation

## 2023-12-14 DIAGNOSIS — I130 Hypertensive heart and chronic kidney disease with heart failure and stage 1 through stage 4 chronic kidney disease, or unspecified chronic kidney disease: Secondary | ICD-10-CM | POA: Insufficient documentation

## 2023-12-14 DIAGNOSIS — R351 Nocturia: Secondary | ICD-10-CM | POA: Insufficient documentation

## 2023-12-14 DIAGNOSIS — C61 Malignant neoplasm of prostate: Secondary | ICD-10-CM | POA: Insufficient documentation

## 2023-12-14 NOTE — Progress Notes (Signed)
 Radiation Oncology Follow up Note  Name: Martin Bush   Date:   12/14/2023 MRN:  [id number] DOB: 08-29-1955    This 68 y.o. male presents to the clinic today for follow-up status post I-125 interstitial implant for boost in a patient with stage IVb (cT1 cN1 M1 B) Gleason 9 (4+5) adenocarcinoma the prostate presenting with a PSA of 7.1.  REFERRING PROVIDER: [facility]  HPI: Patient is a 68 year old male who has completed his I-125 interstitial implant with a boost dose for stage IVb Gleason 9 adenocarcinoma the prostate.  He is seen here to continue his treatment plan which would consist of IMRT radiation therapy to his prostate and pelvic nodes.  To be followed by SBRT treatment to areas of his scapula and base of skull for oligometastatic disease.  His post seed implant course was complicated by acute on chronic heart failure with reduced ejection fraction.  Patient has multiple medical comorbidities including stage IIIb chronic renal disease elevated troponins hyperlipidemia hypertension type 2 diabetes he is seen today since he has been discharged on 13 November.  He is doing fairly well no specific shortness of breath cough or chest tightness.  He is having very little increased lower urinary tract symptoms does have nocturia x 3-4 bowels are stable.  COMPLICATIONS OF TREATMENT: none  FOLLOW UP COMPLIANCE: keeps appointments   PHYSICAL EXAM:  BP 122/68   Pulse 75   Temp 98 F (36.7 C)   Resp 16   Ht 5' 7 (1.702 m)   Wt 211 lb 9.6 oz (96 kg)   BMI 33.14 kg/m  Well-developed well-nourished patient in NAD. HEENT reveals PERLA, EOMI, discs not visualized.  Oral cavity is clear. No oral mucosal lesions are identified. Neck is clear without evidence of cervical or supraclavicular adenopathy. Lungs are clear to A&P. Cardiac examination is essentially unremarkable with regular rate and rhythm without murmur rub or thrill. Abdomen is benign with no organomegaly or masses  noted. Motor sensory and DTR levels are equal and symmetric in the upper and lower extremities. Cranial nerves II through XII are grossly intact. Proprioception is intact. No peripheral adenopathy or edema is identified. No motor or sensory levels are noted. Crude visual fields are within normal range.  RADIOLOGY RESULTS: No current films for review  PLAN: At this time we will go ahead with IMRT radiation therapy to his prostate and pelvic nodes.  Would plan on delivering 50 Gray in 25 fractions to his prostate.  Would also treat his uninvolved pelvic nodes to 45 Gray and the avid nodes to 60 Gray using IMRT dose painting technique.  Risks and benefits of treatment occluding increased lower Neri tract symptoms diarrhea fatigue alteration blood counts skin reaction all reviewed in detail with the patient.  I have personally set up and ordered CT simulation.  Once that is complete we will treat his to all local medical metastatic sites with 30 Gray in 5 fractions using SBRT. Patient comprehends my recommendations well.  I would like to take this opportunity to thank you for allowing me to participate in the care of your patient.SABRA Marcey Penton, MD

## 2023-12-19 ENCOUNTER — Encounter: Payer: Self-pay | Admitting: Cardiology

## 2023-12-19 NOTE — Progress Notes (Signed)
 TO BE COMPLETED BY RADIATION ONCOLOGIST OFFICE:   Patient Name: Martin Bush   Date of Birth: January 13, 1956   Radiation Oncologist: Dr. Marcey Penton  Site to be Treated: Prostate  Will x-rays >10 MV be used? Unknown  Will the radiation be >10 cm from the device? Yes  Planned Treatment Start Date: 01/04/24  [facility] Fax # [phone])  TO BE COMPLETED BY CARDIOLOGIST OFFICE:   Device Information:  Pacemaker []      ICD [x]    Brand: Medtronic: [phone] Model #: Medtronic [id number] Cobalt XT HF Quad CRT-D MRI  Serial Number: [id number]   Date of Placement: 03/08/23  Site of Placement: chest  Remote Device Check--Frequency: 91 days  Last Check: 12/09/23  Is the Patient Pacer Dependent?:  Yes []   No [x]   Does cardiologist request Radiation Oncology to schedule device testing by vendor for the following:  Prior to the Initiation of Treatments?  Yes []  No [x]  During Treatments?  Yes []  No [x]  Post Radiation Treatments?  Yes []  No [x]   Is device monitoring necessary by vendor/cardiologist team during treatments?  Yes []   No [x]   Is cardiac monitoring by Radiation Oncology nursing necessary during treatments? Yes []   No [x]   Do you recommend device be relocated prior to Radiation Treatment? Yes []   No [x]   **PLEASE LIST ANY NOTES OR SPECIAL REQUESTS:       CARDIOLOGIST SIGNATURE:  Fonda Kitty, MD Per Device Clinic Standing Orders, Prentice JINNY Silvan  12/19/2023 5:11 PM  **Please route completed form back to Radiation Oncology Nursing and P [facility] RAD ONC ADMIN, OR send an update if there will be a delay in having form completed by expected start date.  **Call [phone] if you have any questions or do not get an in-basket response from a Radiation Oncology staff member

## 2023-12-20 ENCOUNTER — Telehealth: Payer: Self-pay | Admitting: *Deleted

## 2023-12-20 ENCOUNTER — Encounter: Payer: Self-pay | Admitting: Urology

## 2023-12-20 NOTE — Progress Notes (Signed)
 Radiation Oncology         [phone]) [phone] ________________________________  Name: Martin Bush MRN: [id number]  Date: 12/21/2023  DOB: [birth year]  Post-Seed Follow-Up Visit Note  CC: [facility] Sindy Frederic Fairy, MD  Diagnosis:   68 y.o. 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     ICD-10-CM   1. Malignant neoplasm of prostate (HCC)  C61       Interval Since Last Radiation:  3.5 weeks 11/24/23:  Insertion of radioactive I-125 seeds into the prostate gland; 110 Gy, boost therapy with placement of SpaceOAR gel. He will proceed with IMRT radiation therapy to his prostate and pelvic nodes under the care of Dr. Lenn at [facility]. He plans on delivering 50 Gray in 25 fractions to his prostate and will also treat his uninvolved pelvic nodes to 45 Gray and the PET avid nodes to 60 Gray using IMRT dose painting technique. Once that is complete, the metastatic lesions in the right scapula and left skull base will be treated to 30 Gray in 5 fractions of SBRT.   Narrative:  The patient returns today for routine follow-up.  He is complaining of increased urinary frequency and urinary hesitation symptoms. He filled out a questionnaire regarding urinary function today providing and overall IPSS score of 8 characterizing his symptoms as mild and manageable.  He did go into postoperative urinary retention requiring Foley catheter placement on 12/05/2023 but this has since been removed and he is taking Flomax  daily as prescribed and is voiding on his own without issue at this point.  He specifically denies dysuria, gross hematuria, straining to void or incontinence.  His pre-implant score was 4. He denies any abdominal pain or bowel symptoms.  He has had some fatigue but feels like this is gradually improving and overall, he is pleased with his progress to date.  He  is scheduled to start daily prostate/pelvic IMRT with Dr. Lenn at [facility] next week.  ALLERGIES:  is allergic to diltiazem.  Meds: Current Outpatient Medications  Medication Sig Dispense Refill   amiodarone  (PACERONE ) 200 MG tablet Take 0.5 tablets (100 mg total) by mouth daily. 45 tablet 3   aspirin  EC 81 MG EC tablet Take 1 tablet (81 mg total) by mouth daily.     calcium  carbonate (OS-CAL) 1250 (500 Ca) MG chewable tablet Chew 1 tablet by mouth daily.     carvedilol  (COREG ) 25 MG tablet Take 1 tablet (25 mg total) by mouth 2 (two) times daily with a meal. 180 tablet 3   Cholecalciferol  (VITAMIN D3) 50 MCG (2000 UT) TABS Take 2,000 Units by mouth 2 (two) times daily.      empagliflozin  (JARDIANCE ) 25 MG TABS tablet Take 1 tablet (25 mg total) by mouth daily. 30 tablet 1   ezetimibe  (ZETIA ) 10 MG tablet Take 1 tablet (10 mg total) by mouth daily. 90 tablet 3   ferrous sulfate  325 (65 FE) MG tablet Take 325 mg by mouth every Monday, Wednesday, and Friday. In the morning     furosemide  (LASIX ) 40 MG tablet Take 1 tablet (40 mg total) by mouth daily. 30 tablet 1   insulin  isophane & regular human KwikPen (NOVOLIN 70/30 KWIKPEN) (70-30) 100 UNIT/ML KwikPen Inject 24 Units into the skin daily as needed (for blood glucose 180 or greater).     metFORMIN  (GLUCOPHAGE ) 500 MG tablet Take 500  mg by mouth 2 (two) times daily with a meal.     nitroGLYCERIN  (NITROSTAT ) 0.4 MG SL tablet Place 1 tablet (0.4 mg total) under the tongue every 5 (five) minutes x 3 doses as needed for chest pain. 25 tablet 3   OXYGEN  Inhale 2 L/min into the lungs See admin instructions. 2 L/min at bedtime in conjunction with CPAP      PRESCRIPTION MEDICATION Inhale into the lungs See admin instructions. CPAP- At bedtime     relugolix (ORGOVYX) 120 MG tablet Take 120 mg by mouth daily.     rosuvastatin  (CRESTOR ) 40 MG tablet Take 1 tablet (40 mg total) by mouth at bedtime.     sacubitril -valsartan  (ENTRESTO ) 49-51 MG Take 1  tablet by mouth 2 (two) times daily. 60 tablet 0   sertraline  (ZOLOFT ) 50 MG tablet Take 50 mg by mouth at bedtime.     tamsulosin  (FLOMAX ) 0.4 MG CAPS capsule Take 1 capsule (0.4 mg total) by mouth at bedtime. 30 capsule 0   tirzepatide (MOUNJARO) 7.5 MG/0.5ML Pen Inject 7.5 mg into the skin once a week.     traMADol  (ULTRAM ) 50 MG tablet Take 1-2 tablets (50-100 mg total) by mouth every 6 (six) hours as needed (pain). 8 tablet 0   No current facility-administered medications for this visit.    Physical Findings: In general this is a well appearing Caucasian male in no acute distress. He's alert and oriented x4 and appropriate throughout the examination. Cardiopulmonary assessment is negative for acute distress and he exhibits normal effort.   Lab Findings: Lab Results  Component Value Date   WBC 7.1 12/08/2023   HGB 10.6 (L) 12/08/2023   HCT 32.7 (L) 12/08/2023   MCV 92.9 12/08/2023   PLT 164 12/08/2023    Radiographic Findings:  Patient underwent CT imaging in our clinic for post implant dosimetry. The CT will be reviewed by Dr. Patrcia to confirm there is an adequate distribution of radioactive seeds throughout the prostate gland and ensure that there are no seeds in or near the rectum. We suspect the final radiation plan and dosimetry will show appropriate coverage of the prostate gland. He understands that we will call and inform him of any unexpected findings on further review of his imaging and dosimetry.  Impression/Plan: 68 y.o. 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  The patient is recovering from the effects of his brachytherapy boost procedure. He will proceed with IMRT radiation therapy to his prostate and pelvic nodes under the care of Dr. Lenn at [facility]. He plans on delivering 50 Gray in 25 fractions to his prostate and will also treat his  uninvolved pelvic nodes to 45 Gray and the PET avid nodes to 60 Gray using IMRT dose painting technique. Once that is complete, the metastatic lesions in the right scapula and left skull base will be treated to 30 Gray in 5 fractions of SBRT.  He understands what to expect with his PSA measures. Patient was also educated today about some of the long-term effects from radiation including a small risk for rectal bleeding and possibly erectile dysfunction. We talked about some of the general management approaches to these potential complications. However, I did encourage the patient to contact our office or return at any point if he has questions or concerns related to his seed boost procedure and/or prostate cancer.    Alucard Fearnow W.  Nefertiti Mohamad, PA-C

## 2023-12-20 NOTE — Telephone Encounter (Signed)
 CALLED PATIENT TO REMIND OF POST SEED APPTS. FOR 12-21-23, SPOKE WITH PATIENT AND HE IS AWARE OF THESE APPTS.

## 2023-12-20 NOTE — Progress Notes (Signed)
  Radiation Oncology         [phone]) [phone] ________________________________  Name: Martin Bush MRN: [id number]  Date: 12/21/2023  DOB: 17-Jan-1956  COMPLEX SIMULATION NOTE- BRACHYTHERAPY BOOST  NARRATIVE:  The patient was brought to the CT Simulation planning suite today following prostate seed implantation approximately one month ago.  Identity was confirmed.  All relevant records and images related to the planned course of therapy were reviewed.  Then, the patient was set-up supine.  CT images were obtained.  The CT images were loaded into the planning software.  Then the prostate and rectum were contoured.  Treatment planning then occurred.  The implanted iodine  125 seeds were identified by the physics staff for projection of radiation distribution  I have requested : 3D Simulation  I have requested a DVH of the following structures: Prostate and rectum.    The patient is scheduled for CT Simulation with Dr. Lenn at [facility] and will receive 50 Gray in 25 fractions to his prostate. The uninvolved pelvic nodes will be treated to 45 Gray and the avid nodes to 60 Gray using IMRT dose painting technique, to supplement an up-front prostate seed implant boost of 110 Gy to achieve a total nominal dose of 160 Gy in the prostate. Following completion of the prostate and pelvic radiation, the bony metastases in the right scapula and left skull base will be treated to 30 Gray in 5 fractions using SBRT.   ________________________________  Donnice LABOR. Patrcia, M.D.

## 2023-12-21 ENCOUNTER — Ambulatory Visit
Admission: RE | Admit: 2023-12-21 | Discharge: 2023-12-21 | Disposition: A | Discharge status: Home / Self Care | Source: Ambulatory Visit | Attending: Urology | Admitting: Urology

## 2023-12-21 VITALS — BP 130/67 | HR 82 | Temp 97.7°F | Resp 20 | Ht 67.0 in | Wt 212.0 lb

## 2023-12-21 DIAGNOSIS — Z7982 Long term (current) use of aspirin: Secondary | ICD-10-CM | POA: Insufficient documentation

## 2023-12-21 DIAGNOSIS — C61 Malignant neoplasm of prostate: Secondary | ICD-10-CM | POA: Insufficient documentation

## 2023-12-21 DIAGNOSIS — Z7984 Long term (current) use of oral hypoglycemic drugs: Secondary | ICD-10-CM | POA: Insufficient documentation

## 2023-12-21 DIAGNOSIS — Z79899 Other long term (current) drug therapy: Secondary | ICD-10-CM | POA: Insufficient documentation

## 2023-12-21 DIAGNOSIS — C7951 Secondary malignant neoplasm of bone: Secondary | ICD-10-CM | POA: Insufficient documentation

## 2023-12-21 DIAGNOSIS — Z794 Long term (current) use of insulin: Secondary | ICD-10-CM | POA: Insufficient documentation

## 2023-12-21 DIAGNOSIS — C775 Secondary and unspecified malignant neoplasm of intrapelvic lymph nodes: Secondary | ICD-10-CM | POA: Insufficient documentation

## 2023-12-26 ENCOUNTER — Ambulatory Visit
Admission: RE | Admit: 2023-12-26 | Discharge: 2023-12-26 | Disposition: A | Discharge status: Home / Self Care | Source: Ambulatory Visit | Attending: Radiation Oncology | Admitting: Radiation Oncology

## 2023-12-26 DIAGNOSIS — Z510 Encounter for antineoplastic radiation therapy: Secondary | ICD-10-CM | POA: Diagnosis present

## 2023-12-26 DIAGNOSIS — C61 Malignant neoplasm of prostate: Secondary | ICD-10-CM | POA: Insufficient documentation

## 2023-12-26 DIAGNOSIS — C7951 Secondary malignant neoplasm of bone: Secondary | ICD-10-CM | POA: Diagnosis present

## 2023-12-26 DIAGNOSIS — C775 Secondary and unspecified malignant neoplasm of intrapelvic lymph nodes: Secondary | ICD-10-CM | POA: Diagnosis not present

## 2023-12-27 ENCOUNTER — Ambulatory Visit: Admitting: Radiation Oncology

## 2023-12-30 ENCOUNTER — Encounter: Payer: Self-pay | Admitting: Radiation Oncology

## 2023-12-30 ENCOUNTER — Other Ambulatory Visit: Payer: Self-pay | Admitting: *Deleted

## 2023-12-30 DIAGNOSIS — C61 Malignant neoplasm of prostate: Secondary | ICD-10-CM

## 2023-12-30 DIAGNOSIS — Z510 Encounter for antineoplastic radiation therapy: Secondary | ICD-10-CM | POA: Diagnosis not present

## 2023-12-30 NOTE — Progress Notes (Unsigned)
  Cardiology Office Note:  .   Date:  12/30/2023  ID:  Martin Bush, DOB 07-Feb-1955, MRN [id number] PCP: [facility] Providers Cardiologist:  Gordy Bergamo, MD Electrophysiologist:  Fonda Kitty, MD {  History of Present Illness: .   Martin Bush is a 68 y.o. male with history of CAD status post CABG times 02/2000, chronic HFrEF, recurrent sustained VT status post ICD, carotid artery stenosis status post right and left TCAR 2022, hypertension, hyperlipidemia, type 2 diabetes, CKD, prostate cancer.     CAD 2002 CABG x 2 LIMA to the LAD. 2016 through 2020 multiple left heart catheterizations. Most recent: 12/2018 LHC chronically occluded SVG to OM.  Severe in-stent restenosis in the left main stent extending into the proximal LCx, significant restenosis in the stent placed in the left posterior AV groove artery.  RCA known to be severely diseased but small in caliber.  Eventually underwent angioplasty of the left main and mid LCx.   11/2019 LHC with patent LIMA to mid LAD.  Patent LM/LCx stent, OM 3 60% stenosis.  No change in coronary anatomy from 09/2019  Cardiomyopathy Long history Most recent echos 04/2022 EF 25 to 30% 08/2023 EF 35 to 40% 11/2023 CHF admission, EF 35 to 40%  Recurrent VT ICD change out 02/2023 Manage on amiodarone   Social history      Patient with complex cardiac history as mentioned above.  Extensive history of CAD with prior history of CABG in history of in-stent restenosis in his left main extending into the LCx.  Most recent cardiac catheterization 11/2019 with patent LIMA to LAD, patent LM/LCx stents.  Additionally has had very long history of ischemic cardiomyopathy with reduced EF with recent admission 11/2023.  CAD status post CABG x 2 Hyperlipidemia - 02/2000 CABG x 2 LIMA to LAD  Recurrent sustained VT status post ICD EP managing.   Carotid artery stenosis -2022 left and right TCAR  Hypertension  Type 2  diabetes  Prostate cancer He has oligometastatic adenocarcinoma of the prostate involving bony skeleton and solitary pelvic lymph node.  Status post brachytherapy and proceeding with IMRT radiation therapy.  Followed by [facility] oncology.   ROS: Denies: Chest pain, shortness of breath, orthopnea, peripheral edema, palpitations, decreased exercise intolerance, fatigue, lightheadedness.   Studies Reviewed: .         Risk Assessment/Calculations:   {Does this patient have ATRIAL FIBRILLATION?:[phone]} No BP recorded.  {Refresh Note OR Click here to enter BP  :1}***       Physical Exam:   VS:  There were no vitals taken for this visit.   Wt Readings from Last 3 Encounters:  12/21/23 212 lb (96.2 kg)  12/14/23 211 lb 9.6 oz (96 kg)  12/06/23 212 lb 15.4 oz (96.6 kg)    GEN: Well nourished, well developed in no acute distress NECK: No JVD; No carotid bruits CARDIAC: ***RRR, no murmurs, rubs, gallops RESPIRATORY:  Clear to auscultation without rales, wheezing or rhonchi  ABDOMEN: Soft, non-tender, non-distended EXTREMITIES:  No edema; No deformity   ASSESSMENT AND PLAN: .         {Are you ordering a CV Procedure (e.g. stress test, cath, DCCV, TEE, etc)?   Press F2        :[id number]}  Dispo: ***  Signed, Thom LITTIE Sluder, PA-C

## 2023-12-31 DIAGNOSIS — C7951 Secondary malignant neoplasm of bone: Secondary | ICD-10-CM | POA: Diagnosis present

## 2023-12-31 DIAGNOSIS — Z510 Encounter for antineoplastic radiation therapy: Secondary | ICD-10-CM | POA: Diagnosis present

## 2023-12-31 DIAGNOSIS — C775 Secondary and unspecified malignant neoplasm of intrapelvic lymph nodes: Secondary | ICD-10-CM | POA: Diagnosis not present

## 2023-12-31 DIAGNOSIS — C61 Malignant neoplasm of prostate: Secondary | ICD-10-CM | POA: Diagnosis present

## 2023-12-31 NOTE — Progress Notes (Signed)
  Radiation Oncology         [phone]) [phone] ________________________________  Name: [name] MRN: [id number]  Date: 12/30/2023  DOB: 09-Dec-1955  3D Planning Note   Prostate Brachytherapy Post-Implant Dosimetry  Diagnosis: 68 y.o. 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   Narrative: On a previous date, Martin Bush returned following prostate seed implantation for post implant planning. He underwent CT scan complex simulation to delineate the three-dimensional structures of the pelvis and demonstrate the radiation distribution.  Since that time, the seed localization, and complex isodose planning with dose volume histograms have now been completed.  Results:   Prostate Coverage - The dose of radiation delivered to the 90% or more of the prostate gland (D90) was 93.51% of the prescription dose. This exceeds our goal of greater than 90%. Rectal Sparing - The volume of rectal tissue receiving the prescription dose or higher was 0.0 cc. This falls under our thresholds tolerance of 1.0 cc.  Impression: The prostate seed implant appears to show adequate target coverage and appropriate rectal sparing.  Plan:  The patient will continue to follow with urology for ongoing PSA determinations. I would anticipate a high likelihood for local tumor control with minimal risk for rectal morbidity.  ________________________________  [name], M.D.

## 2024-01-03 ENCOUNTER — Ambulatory Visit

## 2024-01-03 ENCOUNTER — Ambulatory Visit: Admission: RE | Admit: 2024-01-03 | Source: Ambulatory Visit

## 2024-01-03 DIAGNOSIS — Z510 Encounter for antineoplastic radiation therapy: Secondary | ICD-10-CM | POA: Diagnosis not present

## 2024-01-04 ENCOUNTER — Ambulatory Visit
Admission: RE | Admit: 2024-01-04 | Discharge: 2024-01-04 | Attending: Radiation Oncology | Admitting: Radiation Oncology

## 2024-01-04 ENCOUNTER — Ambulatory Visit: Attending: Cardiology | Admitting: Cardiology

## 2024-01-04 ENCOUNTER — Encounter: Payer: Self-pay | Admitting: Cardiology

## 2024-01-04 ENCOUNTER — Ambulatory Visit

## 2024-01-04 VITALS — BP 122/66 | HR 54 | Ht 67.0 in | Wt 213.9 lb

## 2024-01-04 DIAGNOSIS — I10 Essential (primary) hypertension: Secondary | ICD-10-CM | POA: Diagnosis not present

## 2024-01-04 DIAGNOSIS — E7800 Pure hypercholesterolemia, unspecified: Secondary | ICD-10-CM

## 2024-01-04 DIAGNOSIS — I2510 Atherosclerotic heart disease of native coronary artery without angina pectoris: Secondary | ICD-10-CM | POA: Diagnosis not present

## 2024-01-04 DIAGNOSIS — I472 Ventricular tachycardia, unspecified: Secondary | ICD-10-CM

## 2024-01-04 DIAGNOSIS — I255 Ischemic cardiomyopathy: Secondary | ICD-10-CM | POA: Diagnosis not present

## 2024-01-04 MED ORDER — EMPAGLIFLOZIN 25 MG PO TABS: 25.0000 mg | ORAL_TABLET | Freq: Every day | ORAL | 3 refills | Status: AC

## 2024-01-04 NOTE — Patient Instructions (Signed)
 Medication Instructions:  Your physician recommends that you continue on your current medications as directed. Please refer to the Current Medication list given to you today.  *If you need a refill on your cardiac medications before your next appointment, please call your pharmacy*  Lab Work: Today: lipid panel, LFT, TSH, BMP If you have labs (blood work) drawn today and your tests are completely normal, you will receive your results only by: MyChart Message (if you have MyChart) OR A paper copy in the mail If you have any lab test that is abnormal or we need to change your treatment, we will call you to review the results.  Testing/Procedures: None   Follow-Up: At [facility], you and your health needs are our priority.  As part of our continuing mission to provide you with exceptional heart care, our providers are all part of one team.  This team includes your primary Cardiologist (physician) and Advanced Practice Providers or APPs (Physician Assistants and Nurse Practitioners) who all work together to provide you with the care you need, when you need it.  Your next appointment:   4 - 6 week(s)  Provider:   Gordy Bergamo, MD    We recommend signing up for the patient portal called MyChart.  Sign up information is provided on this After Visit Summary.  MyChart is used to connect with patients for Virtual Visits (Telemedicine).  Patients are able to view lab/test results, encounter notes, upcoming appointments, etc.  Non-urgent messages can be sent to your provider as well.   To learn more about what you can do with MyChart, go to [email or web address].   Other Instructions Please check and record your blood pressure and weight.   Please notify the office if your increases by 3 pounds overnight.

## 2024-01-05 ENCOUNTER — Ambulatory Visit
Admission: RE | Admit: 2024-01-05 | Discharge: 2024-01-05 | Disposition: A | Discharge status: Home / Self Care | Source: Ambulatory Visit | Attending: Radiation Oncology | Admitting: Radiation Oncology

## 2024-01-05 ENCOUNTER — Ambulatory Visit: Payer: Self-pay | Admitting: Cardiology

## 2024-01-05 ENCOUNTER — Other Ambulatory Visit: Payer: Self-pay

## 2024-01-05 DIAGNOSIS — Z510 Encounter for antineoplastic radiation therapy: Secondary | ICD-10-CM | POA: Diagnosis not present

## 2024-01-05 LAB — LIPID PANEL
Chol/HDL Ratio: 2.6 ratio (ref 0.0–5.0)
Cholesterol, Total: 130 mg/dL (ref 100–199)
HDL: 50 mg/dL (ref >39)
LDL Chol Calc (NIH): 56 mg/dL (ref 0–99)
Triglycerides: 142 mg/dL (ref 0–149)
VLDL Cholesterol Cal: 24 mg/dL (ref 5–40)

## 2024-01-05 LAB — RAD ONC ARIA SESSION SUMMARY
Course Elapsed Days: 0
Plan Fractions Treated to Date: 1
Plan Prescribed Dose Per Fraction: 2.2 Gy
Plan Total Fractions Prescribed: 25
Plan Total Prescribed Dose: 55 Gy
Reference Point Dosage Given to Date: 2.2 Gy
Reference Point Session Dosage Given: 2.2 Gy
Session Number: 1

## 2024-01-05 LAB — BASIC METABOLIC PANEL WITH GFR
BUN/Creatinine Ratio: 29 — ABNORMAL HIGH (ref 10–24)
BUN: 44 mg/dL — ABNORMAL HIGH (ref 8–27)
CO2: 22 mmol/L (ref 20–29)
Calcium: 9.7 mg/dL (ref 8.6–10.2)
Chloride: 105 mmol/L (ref 96–106)
Creatinine, Ser: 1.54 mg/dL — ABNORMAL HIGH (ref 0.76–1.27)
Glucose: 190 mg/dL — ABNORMAL HIGH (ref 70–99)
Potassium: 4.9 mmol/L (ref 3.5–5.2)
Sodium: 141 mmol/L (ref 134–144)
eGFR: 49 mL/min/1.73 — ABNORMAL LOW (ref >59)

## 2024-01-05 LAB — HEPATIC FUNCTION PANEL
ALT: 11 IU/L (ref 0–44)
AST: 17 IU/L (ref 0–40)
Albumin: 4.3 g/dL (ref 3.9–4.9)
Alkaline Phosphatase: 135 IU/L — ABNORMAL HIGH (ref 47–123)
Bilirubin Total: 0.3 mg/dL (ref 0.0–1.2)
Bilirubin, Direct: 0.12 mg/dL (ref 0.00–0.40)
Total Protein: 6.7 g/dL (ref 6.0–8.5)

## 2024-01-05 LAB — TSH: TSH: 2.74 u[IU]/mL (ref 0.450–4.500)

## 2024-01-06 ENCOUNTER — Other Ambulatory Visit: Payer: Self-pay

## 2024-01-06 ENCOUNTER — Ambulatory Visit
Admission: RE | Admit: 2024-01-06 | Discharge: 2024-01-06 | Disposition: A | Discharge status: Home / Self Care | Source: Ambulatory Visit | Attending: Radiation Oncology | Admitting: Radiation Oncology

## 2024-01-06 DIAGNOSIS — Z510 Encounter for antineoplastic radiation therapy: Secondary | ICD-10-CM | POA: Diagnosis not present

## 2024-01-06 LAB — RAD ONC ARIA SESSION SUMMARY
Course Elapsed Days: 1
Plan Fractions Treated to Date: 2
Plan Prescribed Dose Per Fraction: 2.2 Gy
Plan Total Fractions Prescribed: 25
Plan Total Prescribed Dose: 55 Gy
Reference Point Dosage Given to Date: 4.4 Gy
Reference Point Session Dosage Given: 2.2 Gy
Session Number: 2

## 2024-01-09 ENCOUNTER — Other Ambulatory Visit: Payer: Self-pay

## 2024-01-09 ENCOUNTER — Ambulatory Visit
Admission: RE | Admit: 2024-01-09 | Discharge: 2024-01-09 | Disposition: A | Source: Ambulatory Visit | Attending: Radiation Oncology | Admitting: Radiation Oncology

## 2024-01-09 DIAGNOSIS — Z510 Encounter for antineoplastic radiation therapy: Secondary | ICD-10-CM | POA: Diagnosis not present

## 2024-01-09 LAB — RAD ONC ARIA SESSION SUMMARY
Course Elapsed Days: 4
Plan Fractions Treated to Date: 3
Plan Prescribed Dose Per Fraction: 2.2 Gy
Plan Total Fractions Prescribed: 25
Plan Total Prescribed Dose: 55 Gy
Reference Point Dosage Given to Date: 6.6 Gy
Reference Point Session Dosage Given: 2.2 Gy
Session Number: 3

## 2024-01-10 ENCOUNTER — Ambulatory Visit
Admission: RE | Admit: 2024-01-10 | Discharge: 2024-01-10 | Disposition: A | Discharge status: Home / Self Care | Source: Ambulatory Visit | Attending: Radiation Oncology | Admitting: Radiation Oncology

## 2024-01-10 ENCOUNTER — Other Ambulatory Visit: Payer: Self-pay

## 2024-01-10 DIAGNOSIS — Z510 Encounter for antineoplastic radiation therapy: Secondary | ICD-10-CM | POA: Diagnosis not present

## 2024-01-10 LAB — RAD ONC ARIA SESSION SUMMARY
Course Elapsed Days: 5
Plan Fractions Treated to Date: 4
Plan Prescribed Dose Per Fraction: 2.2 Gy
Plan Total Fractions Prescribed: 25
Plan Total Prescribed Dose: 55 Gy
Reference Point Dosage Given to Date: 8.8 Gy
Reference Point Session Dosage Given: 2.2 Gy
Session Number: 4

## 2024-01-11 ENCOUNTER — Inpatient Hospital Stay: Attending: Radiation Oncology

## 2024-01-11 ENCOUNTER — Ambulatory Visit
Admission: RE | Admit: 2024-01-11 | Discharge: 2024-01-11 | Disposition: A | Discharge status: Home / Self Care | Source: Ambulatory Visit | Attending: Radiation Oncology | Admitting: Radiation Oncology

## 2024-01-11 ENCOUNTER — Other Ambulatory Visit: Payer: Self-pay

## 2024-01-11 DIAGNOSIS — Z510 Encounter for antineoplastic radiation therapy: Secondary | ICD-10-CM | POA: Diagnosis not present

## 2024-01-11 DIAGNOSIS — C61 Malignant neoplasm of prostate: Secondary | ICD-10-CM | POA: Diagnosis present

## 2024-01-11 LAB — RAD ONC ARIA SESSION SUMMARY
Course Elapsed Days: 6
Plan Fractions Treated to Date: 5
Plan Prescribed Dose Per Fraction: 2.2 Gy
Plan Total Fractions Prescribed: 25
Plan Total Prescribed Dose: 55 Gy
Reference Point Dosage Given to Date: 11 Gy
Reference Point Session Dosage Given: 2.2 Gy
Session Number: 5

## 2024-01-11 LAB — CBC (CANCER CENTER ONLY)
HCT: 34.8 % — ABNORMAL LOW (ref 39.0–52.0)
Hemoglobin: 11.2 g/dL — ABNORMAL LOW (ref 13.0–17.0)
MCH: 30.5 pg (ref 26.0–34.0)
MCHC: 32.2 g/dL (ref 30.0–36.0)
MCV: 94.8 fL (ref 80.0–100.0)
Platelet Count: 137 K/uL — ABNORMAL LOW (ref 150–400)
RBC: 3.67 MIL/uL — ABNORMAL LOW (ref 4.22–5.81)
RDW: 13.2 % (ref 11.5–15.5)
WBC Count: 5.3 K/uL (ref 4.0–10.5)
nRBC: 0 % (ref 0.0–0.2)

## 2024-01-12 ENCOUNTER — Other Ambulatory Visit: Payer: Self-pay

## 2024-01-12 ENCOUNTER — Ambulatory Visit
Admission: RE | Admit: 2024-01-12 | Discharge: 2024-01-12 | Disposition: A | Discharge status: Home / Self Care | Source: Ambulatory Visit | Attending: Radiation Oncology | Admitting: Radiation Oncology

## 2024-01-12 ENCOUNTER — Ambulatory Visit (HOSPITAL_COMMUNITY)

## 2024-01-12 DIAGNOSIS — Z510 Encounter for antineoplastic radiation therapy: Secondary | ICD-10-CM | POA: Diagnosis not present

## 2024-01-12 LAB — RAD ONC ARIA SESSION SUMMARY
Course Elapsed Days: 7
Plan Fractions Treated to Date: 6
Plan Prescribed Dose Per Fraction: 2.2 Gy
Plan Total Fractions Prescribed: 25
Plan Total Prescribed Dose: 55 Gy
Reference Point Dosage Given to Date: 13.2 Gy
Reference Point Session Dosage Given: 2.2 Gy
Session Number: 6

## 2024-01-12 NOTE — Addendum Note (Signed)
 Addended by: Stella Bortle on: 01/12/2024 02:56 PM   Modules accepted: Orders

## 2024-01-13 ENCOUNTER — Other Ambulatory Visit: Payer: Self-pay

## 2024-01-13 ENCOUNTER — Ambulatory Visit
Admission: RE | Admit: 2024-01-13 | Discharge: 2024-01-13 | Disposition: A | Discharge status: Home / Self Care | Source: Ambulatory Visit | Attending: Radiation Oncology | Admitting: Radiation Oncology

## 2024-01-13 DIAGNOSIS — Z510 Encounter for antineoplastic radiation therapy: Secondary | ICD-10-CM | POA: Diagnosis not present

## 2024-01-13 LAB — RAD ONC ARIA SESSION SUMMARY
Course Elapsed Days: 8
Plan Fractions Treated to Date: 7
Plan Prescribed Dose Per Fraction: 2.2 Gy
Plan Total Fractions Prescribed: 25
Plan Total Prescribed Dose: 55 Gy
Reference Point Dosage Given to Date: 15.4 Gy
Reference Point Session Dosage Given: 2.2 Gy
Session Number: 7

## 2024-01-16 ENCOUNTER — Ambulatory Visit
Admission: RE | Admit: 2024-01-16 | Discharge: 2024-01-16 | Disposition: A | Discharge status: Home / Self Care | Source: Ambulatory Visit | Attending: Radiation Oncology | Admitting: Radiation Oncology

## 2024-01-16 ENCOUNTER — Other Ambulatory Visit: Payer: Self-pay

## 2024-01-16 DIAGNOSIS — Z510 Encounter for antineoplastic radiation therapy: Secondary | ICD-10-CM | POA: Diagnosis not present

## 2024-01-16 LAB — RAD ONC ARIA SESSION SUMMARY
Course Elapsed Days: 11
Plan Fractions Treated to Date: 8
Plan Prescribed Dose Per Fraction: 2.2 Gy
Plan Total Fractions Prescribed: 25
Plan Total Prescribed Dose: 55 Gy
Reference Point Dosage Given to Date: 17.6 Gy
Reference Point Session Dosage Given: 2.2 Gy
Session Number: 8

## 2024-01-17 ENCOUNTER — Ambulatory Visit
Admission: RE | Admit: 2024-01-17 | Discharge: 2024-01-17 | Disposition: A | Discharge status: Home / Self Care | Source: Ambulatory Visit | Attending: Radiation Oncology | Admitting: Radiation Oncology

## 2024-01-17 ENCOUNTER — Other Ambulatory Visit: Payer: Self-pay

## 2024-01-17 DIAGNOSIS — Z510 Encounter for antineoplastic radiation therapy: Secondary | ICD-10-CM | POA: Diagnosis not present

## 2024-01-17 LAB — RAD ONC ARIA SESSION SUMMARY
Course Elapsed Days: 12
Plan Fractions Treated to Date: 9
Plan Prescribed Dose Per Fraction: 2.2 Gy
Plan Total Fractions Prescribed: 25
Plan Total Prescribed Dose: 55 Gy
Reference Point Dosage Given to Date: 19.8 Gy
Reference Point Session Dosage Given: 2.2 Gy
Session Number: 9

## 2024-01-23 ENCOUNTER — Ambulatory Visit

## 2024-01-23 ENCOUNTER — Ambulatory Visit
Admission: RE | Admit: 2024-01-23 | Discharge: 2024-01-23 | Disposition: A | Discharge status: Home / Self Care | Source: Ambulatory Visit | Attending: Radiation Oncology | Admitting: Radiation Oncology

## 2024-01-23 ENCOUNTER — Other Ambulatory Visit: Payer: Self-pay

## 2024-01-23 DIAGNOSIS — Z510 Encounter for antineoplastic radiation therapy: Secondary | ICD-10-CM | POA: Diagnosis not present

## 2024-01-23 LAB — RAD ONC ARIA SESSION SUMMARY
Course Elapsed Days: 18
Plan Fractions Treated to Date: 10
Plan Prescribed Dose Per Fraction: 2.2 Gy
Plan Total Fractions Prescribed: 25
Plan Total Prescribed Dose: 55 Gy
Reference Point Dosage Given to Date: 22 Gy
Reference Point Session Dosage Given: 2.2 Gy
Session Number: 10

## 2024-01-24 ENCOUNTER — Ambulatory Visit
Admission: RE | Admit: 2024-01-24 | Discharge: 2024-01-24 | Disposition: A | Discharge status: Home / Self Care | Source: Ambulatory Visit | Attending: Radiation Oncology | Admitting: Radiation Oncology

## 2024-01-24 ENCOUNTER — Other Ambulatory Visit: Payer: Self-pay

## 2024-01-24 DIAGNOSIS — Z510 Encounter for antineoplastic radiation therapy: Secondary | ICD-10-CM | POA: Diagnosis not present

## 2024-01-24 LAB — RAD ONC ARIA SESSION SUMMARY
Course Elapsed Days: 19
Plan Fractions Treated to Date: 11
Plan Prescribed Dose Per Fraction: 2.2 Gy
Plan Total Fractions Prescribed: 25
Plan Total Prescribed Dose: 55 Gy
Reference Point Dosage Given to Date: 24.2 Gy
Reference Point Session Dosage Given: 2.2 Gy
Session Number: 11

## 2024-01-25 ENCOUNTER — Other Ambulatory Visit: Payer: Self-pay

## 2024-01-25 ENCOUNTER — Inpatient Hospital Stay

## 2024-01-25 ENCOUNTER — Ambulatory Visit
Admission: RE | Admit: 2024-01-25 | Discharge: 2024-01-25 | Disposition: A | Discharge status: Home / Self Care | Source: Ambulatory Visit | Attending: Radiation Oncology | Admitting: Radiation Oncology

## 2024-01-25 DIAGNOSIS — C61 Malignant neoplasm of prostate: Secondary | ICD-10-CM | POA: Diagnosis not present

## 2024-01-25 DIAGNOSIS — Z510 Encounter for antineoplastic radiation therapy: Secondary | ICD-10-CM | POA: Diagnosis not present

## 2024-01-25 LAB — CBC (CANCER CENTER ONLY)
HCT: 34 % — ABNORMAL LOW (ref 39.0–52.0)
Hemoglobin: 11.2 g/dL — ABNORMAL LOW (ref 13.0–17.0)
MCH: 30.9 pg (ref 26.0–34.0)
MCHC: 32.9 g/dL (ref 30.0–36.0)
MCV: 93.9 fL (ref 80.0–100.0)
Platelet Count: 88 K/uL — ABNORMAL LOW (ref 150–400)
RBC: 3.62 MIL/uL — ABNORMAL LOW (ref 4.22–5.81)
RDW: 13.5 % (ref 11.5–15.5)
WBC Count: 5.4 K/uL (ref 4.0–10.5)
nRBC: 0 % (ref 0.0–0.2)

## 2024-01-25 LAB — RAD ONC ARIA SESSION SUMMARY
Course Elapsed Days: 20
Plan Fractions Treated to Date: 12
Plan Prescribed Dose Per Fraction: 2.2 Gy
Plan Total Fractions Prescribed: 25
Plan Total Prescribed Dose: 55 Gy
Reference Point Dosage Given to Date: 26.4 Gy
Reference Point Session Dosage Given: 2.2 Gy
Session Number: 12

## 2024-01-26 ENCOUNTER — Ambulatory Visit

## 2024-01-27 ENCOUNTER — Ambulatory Visit

## 2024-01-30 ENCOUNTER — Ambulatory Visit

## 2024-01-30 ENCOUNTER — Inpatient Hospital Stay: Attending: Radiation Oncology

## 2024-01-30 ENCOUNTER — Other Ambulatory Visit: Payer: Self-pay

## 2024-01-30 ENCOUNTER — Ambulatory Visit
Admission: RE | Admit: 2024-01-30 | Discharge: 2024-01-30 | Disposition: A | Discharge status: Home / Self Care | Source: Ambulatory Visit | Attending: Radiation Oncology | Admitting: Radiation Oncology

## 2024-01-30 DIAGNOSIS — C775 Secondary and unspecified malignant neoplasm of intrapelvic lymph nodes: Secondary | ICD-10-CM | POA: Insufficient documentation

## 2024-01-30 DIAGNOSIS — C61 Malignant neoplasm of prostate: Secondary | ICD-10-CM | POA: Insufficient documentation

## 2024-01-30 DIAGNOSIS — C7951 Secondary malignant neoplasm of bone: Secondary | ICD-10-CM | POA: Insufficient documentation

## 2024-01-30 DIAGNOSIS — Z510 Encounter for antineoplastic radiation therapy: Secondary | ICD-10-CM | POA: Insufficient documentation

## 2024-01-30 LAB — RAD ONC ARIA SESSION SUMMARY
Course Elapsed Days: 25
Plan Fractions Treated to Date: 13
Plan Prescribed Dose Per Fraction: 2.2 Gy
Plan Total Fractions Prescribed: 25
Plan Total Prescribed Dose: 55 Gy
Reference Point Dosage Given to Date: 28.6 Gy
Reference Point Session Dosage Given: 2.2 Gy
Session Number: 13

## 2024-01-30 LAB — CBC (CANCER CENTER ONLY)
HCT: 34.3 % — ABNORMAL LOW (ref 39.0–52.0)
Hemoglobin: 11 g/dL — ABNORMAL LOW (ref 13.0–17.0)
MCH: 30.2 pg (ref 26.0–34.0)
MCHC: 32.1 g/dL (ref 30.0–36.0)
MCV: 94.2 fL (ref 80.0–100.0)
Platelet Count: 129 K/uL — ABNORMAL LOW (ref 150–400)
RBC: 3.64 MIL/uL — ABNORMAL LOW (ref 4.22–5.81)
RDW: 13.2 % (ref 11.5–15.5)
WBC Count: 5 K/uL (ref 4.0–10.5)
nRBC: 0 % (ref 0.0–0.2)

## 2024-01-31 ENCOUNTER — Other Ambulatory Visit: Payer: Self-pay

## 2024-01-31 ENCOUNTER — Ambulatory Visit
Admission: RE | Admit: 2024-01-31 | Discharge: 2024-01-31 | Disposition: A | Source: Ambulatory Visit | Attending: Radiation Oncology | Admitting: Radiation Oncology

## 2024-01-31 LAB — RAD ONC ARIA SESSION SUMMARY
Course Elapsed Days: 26
Plan Fractions Treated to Date: 14
Plan Prescribed Dose Per Fraction: 2.2 Gy
Plan Total Fractions Prescribed: 25
Plan Total Prescribed Dose: 55 Gy
Reference Point Dosage Given to Date: 30.8 Gy
Reference Point Session Dosage Given: 2.2 Gy
Session Number: 14

## 2024-02-01 ENCOUNTER — Ambulatory Visit
Admission: RE | Admit: 2024-02-01 | Discharge: 2024-02-01 | Disposition: A | Discharge status: Home / Self Care | Source: Ambulatory Visit | Attending: Radiation Oncology | Admitting: Radiation Oncology

## 2024-02-01 ENCOUNTER — Other Ambulatory Visit: Payer: Self-pay

## 2024-02-01 LAB — RAD ONC ARIA SESSION SUMMARY
Course Elapsed Days: 27
Plan Fractions Treated to Date: 15
Plan Prescribed Dose Per Fraction: 2.2 Gy
Plan Total Fractions Prescribed: 25
Plan Total Prescribed Dose: 55 Gy
Reference Point Dosage Given to Date: 33 Gy
Reference Point Session Dosage Given: 2.2 Gy
Session Number: 15

## 2024-02-02 ENCOUNTER — Ambulatory Visit
Admission: RE | Admit: 2024-02-02 | Discharge: 2024-02-02 | Disposition: A | Discharge status: Home / Self Care | Source: Ambulatory Visit | Attending: Radiation Oncology | Admitting: Radiation Oncology

## 2024-02-02 ENCOUNTER — Other Ambulatory Visit: Payer: Self-pay

## 2024-02-02 LAB — RAD ONC ARIA SESSION SUMMARY
Course Elapsed Days: 28
Plan Fractions Treated to Date: 16
Plan Prescribed Dose Per Fraction: 2.2 Gy
Plan Total Fractions Prescribed: 25
Plan Total Prescribed Dose: 55 Gy
Reference Point Dosage Given to Date: 35.2 Gy
Reference Point Session Dosage Given: 2.2 Gy
Session Number: 16

## 2024-02-03 ENCOUNTER — Ambulatory Visit
Admission: RE | Admit: 2024-02-03 | Discharge: 2024-02-03 | Disposition: A | Source: Ambulatory Visit | Attending: Radiation Oncology | Admitting: Radiation Oncology

## 2024-02-03 ENCOUNTER — Other Ambulatory Visit: Payer: Self-pay

## 2024-02-03 LAB — RAD ONC ARIA SESSION SUMMARY
Course Elapsed Days: 29
Plan Fractions Treated to Date: 17
Plan Prescribed Dose Per Fraction: 2.2 Gy
Plan Total Fractions Prescribed: 25
Plan Total Prescribed Dose: 55 Gy
Reference Point Dosage Given to Date: 37.4 Gy
Reference Point Session Dosage Given: 2.2 Gy
Session Number: 17

## 2024-02-05 ENCOUNTER — Ambulatory Visit

## 2024-02-06 ENCOUNTER — Other Ambulatory Visit: Payer: Self-pay

## 2024-02-06 ENCOUNTER — Ambulatory Visit
Admission: RE | Admit: 2024-02-06 | Discharge: 2024-02-06 | Disposition: A | Source: Ambulatory Visit | Attending: Radiation Oncology | Admitting: Radiation Oncology

## 2024-02-06 LAB — RAD ONC ARIA SESSION SUMMARY
Course Elapsed Days: 32
Plan Fractions Treated to Date: 18
Plan Prescribed Dose Per Fraction: 2.2 Gy
Plan Total Fractions Prescribed: 25
Plan Total Prescribed Dose: 55 Gy
Reference Point Dosage Given to Date: 39.6 Gy
Reference Point Session Dosage Given: 2.2 Gy
Session Number: 18

## 2024-02-07 ENCOUNTER — Other Ambulatory Visit: Payer: Self-pay

## 2024-02-07 ENCOUNTER — Ambulatory Visit
Admission: RE | Admit: 2024-02-07 | Discharge: 2024-02-07 | Disposition: A | Source: Ambulatory Visit | Attending: Radiation Oncology | Admitting: Radiation Oncology

## 2024-02-07 LAB — RAD ONC ARIA SESSION SUMMARY
Course Elapsed Days: 33
Plan Fractions Treated to Date: 19
Plan Prescribed Dose Per Fraction: 2.2 Gy
Plan Total Fractions Prescribed: 25
Plan Total Prescribed Dose: 55 Gy
Reference Point Dosage Given to Date: 41.8 Gy
Reference Point Session Dosage Given: 2.2 Gy
Session Number: 19

## 2024-02-08 ENCOUNTER — Inpatient Hospital Stay

## 2024-02-08 ENCOUNTER — Other Ambulatory Visit: Payer: Self-pay

## 2024-02-08 ENCOUNTER — Ambulatory Visit
Admission: RE | Admit: 2024-02-08 | Discharge: 2024-02-08 | Disposition: A | Discharge status: Home / Self Care | Source: Ambulatory Visit | Attending: Radiation Oncology | Admitting: Radiation Oncology

## 2024-02-08 DIAGNOSIS — C61 Malignant neoplasm of prostate: Secondary | ICD-10-CM

## 2024-02-08 LAB — RAD ONC ARIA SESSION SUMMARY
Course Elapsed Days: 34
Plan Fractions Treated to Date: 20
Plan Prescribed Dose Per Fraction: 2.2 Gy
Plan Total Fractions Prescribed: 25
Plan Total Prescribed Dose: 55 Gy
Reference Point Dosage Given to Date: 44 Gy
Reference Point Session Dosage Given: 2.2 Gy
Session Number: 20

## 2024-02-08 LAB — CBC (CANCER CENTER ONLY)
HCT: 35.7 % — ABNORMAL LOW (ref 39.0–52.0)
Hemoglobin: 11.6 g/dL — ABNORMAL LOW (ref 13.0–17.0)
MCH: 30.7 pg (ref 26.0–34.0)
MCHC: 32.5 g/dL (ref 30.0–36.0)
MCV: 94.4 fL (ref 80.0–100.0)
Platelet Count: 158 K/uL (ref 150–400)
RBC: 3.78 MIL/uL — ABNORMAL LOW (ref 4.22–5.81)
RDW: 13.5 % (ref 11.5–15.5)
WBC Count: 5.5 K/uL (ref 4.0–10.5)
nRBC: 0 % (ref 0.0–0.2)

## 2024-02-09 ENCOUNTER — Ambulatory Visit
Admission: RE | Admit: 2024-02-09 | Discharge: 2024-02-09 | Disposition: A | Source: Ambulatory Visit | Attending: Radiation Oncology | Admitting: Radiation Oncology

## 2024-02-09 ENCOUNTER — Other Ambulatory Visit: Payer: Self-pay

## 2024-02-09 LAB — RAD ONC ARIA SESSION SUMMARY
Course Elapsed Days: 35
Plan Fractions Treated to Date: 21
Plan Prescribed Dose Per Fraction: 2.2 Gy
Plan Total Fractions Prescribed: 25
Plan Total Prescribed Dose: 55 Gy
Reference Point Dosage Given to Date: 46.2 Gy
Reference Point Session Dosage Given: 2.2 Gy
Session Number: 21

## 2024-02-10 ENCOUNTER — Other Ambulatory Visit: Payer: Self-pay

## 2024-02-10 ENCOUNTER — Ambulatory Visit

## 2024-02-10 ENCOUNTER — Ambulatory Visit
Admission: RE | Admit: 2024-02-10 | Discharge: 2024-02-10 | Disposition: A | Discharge status: Home / Self Care | Source: Ambulatory Visit | Attending: Radiation Oncology | Admitting: Radiation Oncology

## 2024-02-10 LAB — RAD ONC ARIA SESSION SUMMARY
Course Elapsed Days: 36
Plan Fractions Treated to Date: 22
Plan Prescribed Dose Per Fraction: 2.2 Gy
Plan Total Fractions Prescribed: 25
Plan Total Prescribed Dose: 55 Gy
Reference Point Dosage Given to Date: 48.4 Gy
Reference Point Session Dosage Given: 2.2 Gy
Session Number: 22

## 2024-02-13 ENCOUNTER — Ambulatory Visit

## 2024-02-13 ENCOUNTER — Ambulatory Visit
Admission: RE | Admit: 2024-02-13 | Discharge: 2024-02-13 | Disposition: A | Source: Ambulatory Visit | Attending: Radiation Oncology | Admitting: Radiation Oncology

## 2024-02-13 ENCOUNTER — Other Ambulatory Visit: Payer: Self-pay

## 2024-02-13 LAB — RAD ONC ARIA SESSION SUMMARY
Course Elapsed Days: 39
Plan Fractions Treated to Date: 23
Plan Prescribed Dose Per Fraction: 2.2 Gy
Plan Total Fractions Prescribed: 25
Plan Total Prescribed Dose: 55 Gy
Reference Point Dosage Given to Date: 50.6 Gy
Reference Point Session Dosage Given: 2.2 Gy
Session Number: 23

## 2024-02-14 ENCOUNTER — Other Ambulatory Visit: Payer: Self-pay

## 2024-02-14 ENCOUNTER — Ambulatory Visit

## 2024-02-14 ENCOUNTER — Ambulatory Visit
Admission: RE | Admit: 2024-02-14 | Discharge: 2024-02-14 | Disposition: A | Source: Ambulatory Visit | Attending: Radiation Oncology | Admitting: Radiation Oncology

## 2024-02-14 LAB — RAD ONC ARIA SESSION SUMMARY
Course Elapsed Days: 40
Plan Fractions Treated to Date: 24
Plan Prescribed Dose Per Fraction: 2.2 Gy
Plan Total Fractions Prescribed: 25
Plan Total Prescribed Dose: 55 Gy
Reference Point Dosage Given to Date: 52.8 Gy
Reference Point Session Dosage Given: 2.2 Gy
Session Number: 24

## 2024-02-15 ENCOUNTER — Ambulatory Visit

## 2024-02-15 ENCOUNTER — Ambulatory Visit: Admitting: Cardiology

## 2024-02-15 ENCOUNTER — Other Ambulatory Visit: Payer: Self-pay

## 2024-02-15 ENCOUNTER — Ambulatory Visit
Admission: RE | Admit: 2024-02-15 | Discharge: 2024-02-15 | Disposition: A | Source: Ambulatory Visit | Attending: Radiation Oncology | Admitting: Radiation Oncology

## 2024-02-15 LAB — RAD ONC ARIA SESSION SUMMARY
Course Elapsed Days: 41
Plan Fractions Treated to Date: 25
Plan Prescribed Dose Per Fraction: 2.2 Gy
Plan Total Fractions Prescribed: 25
Plan Total Prescribed Dose: 55 Gy
Reference Point Dosage Given to Date: 55 Gy
Reference Point Session Dosage Given: 2.2 Gy
Session Number: 25

## 2024-02-16 ENCOUNTER — Ambulatory Visit

## 2024-02-16 NOTE — Progress Notes (Signed)
 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 Patient proceed with treatment recommendations of ADT, brachy boost, and 5 weeks of IMRT and had his final radiation treatment on 02/15/24.   Patient has his first post treatment PSA on 04/30/24 at [facility] followed by MD visit on 4/14.

## 2024-02-17 ENCOUNTER — Ambulatory Visit

## 2024-03-19 ENCOUNTER — Ambulatory Visit: Admitting: Radiation Oncology
